# Patient Record
Sex: Female | Born: 1935
Health system: Southern US, Community
[De-identification: ages and names within clinical notes are randomized; demographics above are authoritative.]

## PROBLEM LIST (undated history)

## (undated) DIAGNOSIS — R32 Unspecified urinary incontinence: Secondary | ICD-10-CM

## (undated) DIAGNOSIS — G8929 Other chronic pain: Secondary | ICD-10-CM

## (undated) DIAGNOSIS — K219 Gastro-esophageal reflux disease without esophagitis: Secondary | ICD-10-CM

## (undated) DIAGNOSIS — J449 Chronic obstructive pulmonary disease, unspecified: Secondary | ICD-10-CM

## (undated) DIAGNOSIS — Z8673 Personal history of transient ischemic attack (TIA), and cerebral infarction without residual deficits: Secondary | ICD-10-CM

## (undated) DIAGNOSIS — Z973 Presence of spectacles and contact lenses: Secondary | ICD-10-CM

## (undated) DIAGNOSIS — I10 Essential (primary) hypertension: Secondary | ICD-10-CM

## (undated) DIAGNOSIS — M1990 Unspecified osteoarthritis, unspecified site: Secondary | ICD-10-CM

## (undated) DIAGNOSIS — Z8719 Personal history of other diseases of the digestive system: Secondary | ICD-10-CM

## (undated) DIAGNOSIS — M549 Dorsalgia, unspecified: Secondary | ICD-10-CM

## (undated) DIAGNOSIS — I499 Cardiac arrhythmia, unspecified: Secondary | ICD-10-CM

## (undated) DIAGNOSIS — Z952 Presence of prosthetic heart valve: Secondary | ICD-10-CM

## (undated) DIAGNOSIS — Z8711 Personal history of peptic ulcer disease: Secondary | ICD-10-CM

## (undated) DIAGNOSIS — K5790 Diverticulosis of intestine, part unspecified, without perforation or abscess without bleeding: Secondary | ICD-10-CM

## (undated) DIAGNOSIS — I639 Cerebral infarction, unspecified: Secondary | ICD-10-CM

## (undated) DIAGNOSIS — H3530 Unspecified macular degeneration: Secondary | ICD-10-CM

## (undated) DIAGNOSIS — N3010 Interstitial cystitis (chronic) without hematuria: Secondary | ICD-10-CM

## (undated) DIAGNOSIS — E785 Hyperlipidemia, unspecified: Secondary | ICD-10-CM

## (undated) DIAGNOSIS — I272 Pulmonary hypertension, unspecified: Secondary | ICD-10-CM

## (undated) DIAGNOSIS — M21379 Foot drop, unspecified foot: Secondary | ICD-10-CM

## (undated) DIAGNOSIS — IMO0001 Reserved for inherently not codable concepts without codable children: Secondary | ICD-10-CM

## (undated) DIAGNOSIS — H9190 Unspecified hearing loss, unspecified ear: Secondary | ICD-10-CM

## (undated) DIAGNOSIS — I4891 Unspecified atrial fibrillation: Secondary | ICD-10-CM

## (undated) DIAGNOSIS — Z8679 Personal history of other diseases of the circulatory system: Secondary | ICD-10-CM

## (undated) DIAGNOSIS — H547 Unspecified visual loss: Secondary | ICD-10-CM

## (undated) DIAGNOSIS — Z972 Presence of dental prosthetic device (complete) (partial): Secondary | ICD-10-CM

## (undated) DIAGNOSIS — I219 Acute myocardial infarction, unspecified: Secondary | ICD-10-CM

## (undated) DIAGNOSIS — R932 Abnormal findings on diagnostic imaging of liver and biliary tract: Secondary | ICD-10-CM

## (undated) HISTORY — PX: DILATION AND CURETTAGE OF UTERUS: SHX78

## (undated) HISTORY — PX: OTHER SURGICAL HISTORY: SHX169

## (undated) HISTORY — DX: Cerebral infarction, unspecified: I63.9

## (undated) HISTORY — PX: CARPAL TUNNEL RELEASE: SHX101

## (undated) HISTORY — DX: Diverticulosis of intestine, part unspecified, without perforation or abscess without bleeding: K57.90

## (undated) HISTORY — DX: Unspecified urinary incontinence: R32

## (undated) HISTORY — DX: Abnormal findings on diagnostic imaging of liver and biliary tract: R93.2

## (undated) HISTORY — PX: TONSILLECTOMY AND ADENOIDECTOMY: SUR1326

## (undated) HISTORY — PX: LUMBAR DISC SURGERY: SHX700

## (undated) HISTORY — DX: Interstitial cystitis (chronic) without hematuria: N30.10

## (undated) HISTORY — DX: Pulmonary hypertension, unspecified: I27.20

## (undated) HISTORY — DX: Unspecified osteoarthritis, unspecified site: M19.90

## (undated) HISTORY — PX: CATARACT EXTRACTION W/ INTRAOCULAR LENS  IMPLANT, BILATERAL: SHX1307

## (undated) HISTORY — PX: CARDIOVASCULAR STRESS TEST: SHX262

## (undated) HISTORY — DX: Gastro-esophageal reflux disease without esophagitis: K21.9

## (undated) HISTORY — DX: Unspecified atrial fibrillation: I48.91

## (undated) HISTORY — DX: Essential (primary) hypertension: I10

## (undated) HISTORY — DX: Unspecified macular degeneration: H35.30

## (undated) HISTORY — DX: Hyperlipidemia, unspecified: E78.5

## (undated) HISTORY — PX: CARDIAC CATHETERIZATION: SHX172

---
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
## 1965-04-11 HISTORY — PX: ABDOMINAL HYSTERECTOMY: SHX81

## 1967-04-12 HISTORY — PX: VEIN LIGATION: SHX2652

## 1991-04-12 HISTORY — PX: CHOLECYSTECTOMY: SHX55

## 1997-12-26 ENCOUNTER — Ambulatory Visit (HOSPITAL_COMMUNITY): Admission: RE | Admit: 1997-12-26 | Discharge: 1997-12-26 | Payer: Self-pay | Admitting: *Deleted

## 1998-10-01 ENCOUNTER — Ambulatory Visit (HOSPITAL_BASED_OUTPATIENT_CLINIC_OR_DEPARTMENT_OTHER): Admission: RE | Admit: 1998-10-01 | Discharge: 1998-10-01 | Payer: Self-pay | Admitting: Orthopedic Surgery

## 1999-03-25 ENCOUNTER — Encounter: Payer: Self-pay | Admitting: *Deleted

## 1999-03-26 ENCOUNTER — Encounter (INDEPENDENT_AMBULATORY_CARE_PROVIDER_SITE_OTHER): Payer: Self-pay | Admitting: Specialist

## 1999-03-26 ENCOUNTER — Ambulatory Visit (HOSPITAL_COMMUNITY): Admission: RE | Admit: 1999-03-26 | Discharge: 1999-03-26 | Payer: Self-pay | Admitting: *Deleted

## 1999-07-16 ENCOUNTER — Ambulatory Visit (HOSPITAL_COMMUNITY): Admission: RE | Admit: 1999-07-16 | Discharge: 1999-07-16 | Payer: Self-pay | Admitting: *Deleted

## 1999-10-07 ENCOUNTER — Encounter: Payer: Self-pay | Admitting: Orthopedic Surgery

## 1999-10-07 ENCOUNTER — Encounter: Admission: RE | Admit: 1999-10-07 | Discharge: 1999-10-07 | Payer: Self-pay | Admitting: Orthopedic Surgery

## 1999-10-08 ENCOUNTER — Ambulatory Visit (HOSPITAL_BASED_OUTPATIENT_CLINIC_OR_DEPARTMENT_OTHER): Admission: RE | Admit: 1999-10-08 | Discharge: 1999-10-09 | Payer: Self-pay | Admitting: Orthopedic Surgery

## 1999-10-08 HISTORY — PX: OTHER SURGICAL HISTORY: SHX169

## 1999-10-19 ENCOUNTER — Encounter: Admission: RE | Admit: 1999-10-19 | Discharge: 1999-11-25 | Payer: Self-pay | Admitting: Orthopedic Surgery

## 2000-01-07 ENCOUNTER — Ambulatory Visit (HOSPITAL_COMMUNITY): Admission: RE | Admit: 2000-01-07 | Discharge: 2000-01-07 | Payer: Self-pay | Admitting: *Deleted

## 2000-04-06 ENCOUNTER — Ambulatory Visit (HOSPITAL_COMMUNITY): Admission: RE | Admit: 2000-04-06 | Discharge: 2000-04-06 | Payer: Self-pay | Admitting: *Deleted

## 2000-08-11 ENCOUNTER — Ambulatory Visit (HOSPITAL_COMMUNITY): Admission: RE | Admit: 2000-08-11 | Discharge: 2000-08-11 | Payer: Self-pay | Admitting: *Deleted

## 2001-01-16 ENCOUNTER — Other Ambulatory Visit: Admission: RE | Admit: 2001-01-16 | Discharge: 2001-01-16 | Payer: Self-pay | Admitting: Family Medicine

## 2001-01-31 ENCOUNTER — Encounter: Payer: Self-pay | Admitting: *Deleted

## 2001-01-31 ENCOUNTER — Encounter: Admission: RE | Admit: 2001-01-31 | Discharge: 2001-01-31 | Payer: Self-pay | Admitting: *Deleted

## 2001-02-02 ENCOUNTER — Ambulatory Visit (HOSPITAL_BASED_OUTPATIENT_CLINIC_OR_DEPARTMENT_OTHER): Admission: RE | Admit: 2001-02-02 | Discharge: 2001-02-02 | Payer: Self-pay | Admitting: *Deleted

## 2001-06-06 ENCOUNTER — Encounter: Payer: Self-pay | Admitting: Sports Medicine

## 2001-06-06 ENCOUNTER — Encounter: Admission: RE | Admit: 2001-06-06 | Discharge: 2001-06-06 | Payer: Self-pay | Admitting: Sports Medicine

## 2001-07-11 ENCOUNTER — Encounter: Admission: RE | Admit: 2001-07-11 | Discharge: 2001-07-11 | Payer: Self-pay | Admitting: Orthopedic Surgery

## 2001-07-11 ENCOUNTER — Encounter: Payer: Self-pay | Admitting: Orthopedic Surgery

## 2001-07-12 ENCOUNTER — Ambulatory Visit (HOSPITAL_BASED_OUTPATIENT_CLINIC_OR_DEPARTMENT_OTHER): Admission: RE | Admit: 2001-07-12 | Discharge: 2001-07-13 | Payer: Self-pay | Admitting: Orthopedic Surgery

## 2001-07-12 HISTORY — PX: OTHER SURGICAL HISTORY: SHX169

## 2002-01-11 ENCOUNTER — Ambulatory Visit (HOSPITAL_BASED_OUTPATIENT_CLINIC_OR_DEPARTMENT_OTHER): Admission: RE | Admit: 2002-01-11 | Discharge: 2002-01-11 | Payer: Self-pay | Admitting: *Deleted

## 2002-07-29 ENCOUNTER — Encounter: Admission: RE | Admit: 2002-07-29 | Discharge: 2002-07-29 | Payer: Self-pay | Admitting: Urology

## 2002-07-29 ENCOUNTER — Encounter: Payer: Self-pay | Admitting: Urology

## 2002-07-30 ENCOUNTER — Ambulatory Visit (HOSPITAL_BASED_OUTPATIENT_CLINIC_OR_DEPARTMENT_OTHER): Admission: RE | Admit: 2002-07-30 | Discharge: 2002-07-30 | Payer: Self-pay | Admitting: Urology

## 2002-07-30 HISTORY — PX: OTHER SURGICAL HISTORY: SHX169

## 2002-08-29 ENCOUNTER — Ambulatory Visit (HOSPITAL_COMMUNITY): Admission: RE | Admit: 2002-08-29 | Discharge: 2002-08-29 | Payer: Self-pay | Admitting: Gastroenterology

## 2002-08-29 ENCOUNTER — Encounter (INDEPENDENT_AMBULATORY_CARE_PROVIDER_SITE_OTHER): Payer: Self-pay | Admitting: Specialist

## 2003-03-11 ENCOUNTER — Ambulatory Visit (HOSPITAL_COMMUNITY): Admission: RE | Admit: 2003-03-11 | Discharge: 2003-03-11 | Payer: Self-pay | Admitting: *Deleted

## 2003-03-11 ENCOUNTER — Ambulatory Visit (HOSPITAL_BASED_OUTPATIENT_CLINIC_OR_DEPARTMENT_OTHER): Admission: RE | Admit: 2003-03-11 | Discharge: 2003-03-11 | Payer: Self-pay | Admitting: *Deleted

## 2003-07-15 ENCOUNTER — Ambulatory Visit (HOSPITAL_COMMUNITY): Admission: RE | Admit: 2003-07-15 | Discharge: 2003-07-15 | Payer: Self-pay | Admitting: *Deleted

## 2003-09-29 ENCOUNTER — Inpatient Hospital Stay (HOSPITAL_COMMUNITY): Admission: RE | Admit: 2003-09-29 | Discharge: 2003-10-06 | Payer: Self-pay | Admitting: Cardiothoracic Surgery

## 2003-09-29 ENCOUNTER — Encounter (INDEPENDENT_AMBULATORY_CARE_PROVIDER_SITE_OTHER): Payer: Self-pay | Admitting: *Deleted

## 2003-09-29 HISTORY — PX: AORTIC VALVE REPLACEMENT: SHX41

## 2003-10-02 DIAGNOSIS — Z952 Presence of prosthetic heart valve: Secondary | ICD-10-CM | POA: Insufficient documentation

## 2003-11-18 ENCOUNTER — Encounter (HOSPITAL_COMMUNITY): Admission: RE | Admit: 2003-11-18 | Discharge: 2004-02-16 | Payer: Self-pay | Admitting: *Deleted

## 2004-02-10 ENCOUNTER — Ambulatory Visit (HOSPITAL_BASED_OUTPATIENT_CLINIC_OR_DEPARTMENT_OTHER): Admission: RE | Admit: 2004-02-10 | Discharge: 2004-02-10 | Payer: Self-pay | Admitting: Urology

## 2004-02-10 ENCOUNTER — Ambulatory Visit (HOSPITAL_COMMUNITY): Admission: RE | Admit: 2004-02-10 | Discharge: 2004-02-10 | Payer: Self-pay | Admitting: Urology

## 2004-02-17 ENCOUNTER — Encounter (HOSPITAL_COMMUNITY): Admission: RE | Admit: 2004-02-17 | Discharge: 2004-05-17 | Payer: Self-pay | Admitting: *Deleted

## 2004-11-11 ENCOUNTER — Ambulatory Visit (HOSPITAL_COMMUNITY): Admission: RE | Admit: 2004-11-11 | Discharge: 2004-11-11 | Payer: Self-pay | Admitting: Urology

## 2004-11-11 ENCOUNTER — Ambulatory Visit (HOSPITAL_BASED_OUTPATIENT_CLINIC_OR_DEPARTMENT_OTHER): Admission: RE | Admit: 2004-11-11 | Discharge: 2004-11-11 | Payer: Self-pay | Admitting: Urology

## 2004-12-18 ENCOUNTER — Encounter: Admission: RE | Admit: 2004-12-18 | Discharge: 2004-12-18 | Payer: Self-pay | Admitting: Family Medicine

## 2005-07-06 ENCOUNTER — Encounter: Admission: RE | Admit: 2005-07-06 | Discharge: 2005-07-06 | Payer: Self-pay | Admitting: Urology

## 2005-07-07 ENCOUNTER — Ambulatory Visit (HOSPITAL_BASED_OUTPATIENT_CLINIC_OR_DEPARTMENT_OTHER): Admission: RE | Admit: 2005-07-07 | Discharge: 2005-07-07 | Payer: Self-pay | Admitting: Urology

## 2005-10-13 ENCOUNTER — Encounter: Admission: RE | Admit: 2005-10-13 | Discharge: 2005-10-13 | Payer: Self-pay | Admitting: Neurosurgery

## 2005-10-21 ENCOUNTER — Observation Stay (HOSPITAL_COMMUNITY): Admission: RE | Admit: 2005-10-21 | Discharge: 2005-10-22 | Payer: Self-pay | Admitting: Neurosurgery

## 2006-02-21 ENCOUNTER — Ambulatory Visit (HOSPITAL_BASED_OUTPATIENT_CLINIC_OR_DEPARTMENT_OTHER): Admission: RE | Admit: 2006-02-21 | Discharge: 2006-02-21 | Payer: Self-pay | Admitting: Urology

## 2006-12-08 ENCOUNTER — Encounter: Admission: RE | Admit: 2006-12-08 | Discharge: 2006-12-08 | Payer: Self-pay | Admitting: Urology

## 2006-12-14 ENCOUNTER — Ambulatory Visit (HOSPITAL_BASED_OUTPATIENT_CLINIC_OR_DEPARTMENT_OTHER): Admission: RE | Admit: 2006-12-14 | Discharge: 2006-12-14 | Payer: Self-pay | Admitting: Urology

## 2007-02-03 ENCOUNTER — Emergency Department (HOSPITAL_COMMUNITY): Admission: EM | Admit: 2007-02-03 | Discharge: 2007-02-03 | Payer: Self-pay | Admitting: Emergency Medicine

## 2007-03-02 ENCOUNTER — Encounter: Admission: RE | Admit: 2007-03-02 | Discharge: 2007-03-02 | Payer: Self-pay | Admitting: Family Medicine

## 2007-06-14 ENCOUNTER — Encounter: Admission: RE | Admit: 2007-06-14 | Discharge: 2007-06-14 | Payer: Self-pay | Admitting: Family Medicine

## 2007-12-13 ENCOUNTER — Ambulatory Visit (HOSPITAL_BASED_OUTPATIENT_CLINIC_OR_DEPARTMENT_OTHER): Admission: RE | Admit: 2007-12-13 | Discharge: 2007-12-13 | Payer: Self-pay | Admitting: Urology

## 2008-02-21 ENCOUNTER — Encounter: Admission: RE | Admit: 2008-02-21 | Discharge: 2008-02-21 | Payer: Self-pay | Admitting: Family Medicine

## 2009-08-27 ENCOUNTER — Ambulatory Visit (HOSPITAL_BASED_OUTPATIENT_CLINIC_OR_DEPARTMENT_OTHER): Admission: RE | Admit: 2009-08-27 | Discharge: 2009-08-27 | Payer: Self-pay | Admitting: Urology

## 2009-08-27 HISTORY — PX: OTHER SURGICAL HISTORY: SHX169

## 2010-05-02 ENCOUNTER — Encounter: Payer: Self-pay | Admitting: Neurosurgery

## 2010-06-28 LAB — POCT I-STAT 4, (NA,K, GLUC, HGB,HCT): Hemoglobin: 14.3 g/dL (ref 12.0–15.0)

## 2010-08-24 NOTE — Op Note (Signed)
Brittany [name], Brittany [name]               ACCOUNT NO.:  [id number]   MEDICAL RECORD NO.:  [id number]          PATIENT TYPE:  AMB   LOCATION:  [facility]                         FACILITY:  [facility]   PHYSICIAN:  Excell Seltzer. Annabell Howells, M.D.    DATE OF BIRTH:  01-03-1928   DATE OF PROCEDURE:  12/13/2007  DATE OF DISCHARGE:                               OPERATIVE REPORT   PROCEDURES:  1. Cystoscopy.  2. Hydrodistention of the bladder.  3. Comptroller.  4. Urethral dilation.   PREOPERATIVE DIAGNOSIS:  Interstitial cystitis.   POSTOPERATIVE DIAGNOSIS:  Interstitial cystitis with urethral stenosis.   SURGEON:  Bjorn Pippin, MD.   ANESTHESIA:  General.   COMPLICATIONS:  None.   INDICATIONS:  Brittany [name] is a 77 year old white female with a long  history of ICD who requires periodic hydrodistention and Clorpactin  installation with dilation.   FINDINGS/PROCEDURE:  She was given Cipro.  She was taken to the  operating room where a general anesthetic was induced.  She was placed  in lithotomy position.  Her perineum and genitalia were prepped with  Betadine solution, she was draped in the usual sterile fashion.  A  cystoscopy was performed using a 22-French scope and the 70 degree lens.  Examination revealed a normal urethra with the exception of some  residual collagen bilaterally at the bladder neck.  Examination of  bladder wall revealed mild trabeculation, there was a stellate scar on  the left wall from a prior biopsy no tumors or stones were noted, the  ureteral orifices were unremarkable.   The bladder was dilated under 80 centimeters of water pressure to  capacity and then drained.  She held 1200 mL.  Repeat cystoscopy  following dilation revealed no evidence of mucosal tears or  glomerulations.   The cystoscope was removed and a red rubber catheter was inserted and  the bladder was instilled with approximately 240 mL of Clorpactin  solution mixed 1 ampule in 500 mL of sterile water.   The solution was  left indwelling for approximately 2 minutes and the bladder was then  drained.   The urethra was calibrated to 34-French with female sound, she was tight  at 34, which gives suggestive of narrowing.   The perineum was rinsed, she was taken down from lithotomy position, her  anesthetic was reversed, she was moved to the recovery room in stable  condition.  There were no complications.      Excell Seltzer. Annabell Howells, M.D.  Electronically Signed     JJW/MEDQ  D:  12/13/2007  T:  12/13/2007  Job:  [id number]

## 2010-08-24 NOTE — Op Note (Signed)
Brittany [name], Brittany [name]               ACCOUNT NO.:  [id number]   MEDICAL RECORD NO.:  [id number]          PATIENT TYPE:  AMB   LOCATION:  N[facility]                         FACILITY:  [facility]   PHYSICIAN:  Excell Seltzer. Annabell Howells, M.D.    DATE OF BIRTH:  26-May-1927   DATE OF PROCEDURE:  12/14/2006  DATE OF DISCHARGE:                               OPERATIVE REPORT   PROCEDURE:  Cystoscopy with hydrodistention of bladder, installation of  Clorpactin, urethral calibration, installation of Pyridium and Marcaine.   PREOPERATIVE DIAGNOSIS:  Interstitial cystitis.   POSTOPERATIVE DIAGNOSIS:  Interstitial cystitis.   SURGEON:  Dr. Bjorn Pippin.   ANESTHESIA:  General.   SPECIMEN:  None.   COMPLICATIONS:  None.   INDICATIONS:  Brittany [name] is a 77 year old white female with a long  history of interstitial cystitis who requires periodic hydrodistention  and installation of Clorpactin.  Her symptoms recurred in the past week.   FINDINGS/PROCEDURE:  She was taken operating room where a general  anesthetic was induced.  She was placed in the lithotomy position.  She  was given vancomycin 1 gram and gentamicin 1.5 mg for SBE prophylaxis  prior to the procedure.  General anesthetic induced.  She was placed in  lithotomy position.  Her perineum and genitalia were prepped with  Betadine solution and she was draped in the usual sterile fashion.  Cystoscopy was performed using the 22-French scope and the 12 degrees  lens.  Examination revealed some residual Contigen at the bladder neck  bilaterally.  No other urethral lesions were noted.  The bladder wall  had mild trabeculation.  No tumor, stones or inflammation were noted.  Ureteral orifices were unremarkable.  The bladder was dilated under 80  cm of water pressure to capacity and was held for 1 minute the bladder  was drained, her capacity under anesthesia was 900 mL.  There were no  glomerular hemorrhages noted or bladder wall tearing on post dilation   cystoscopy.   She then underwent installation of the bladder with 180 mL of Clorpactin  solution, 1 ampule mixed in 500 mL of normal saline.  This was left  indwelling for a minute   A 26 urethral sound was passed.  There was no evidence of stenosis.  The  bladder was drained and then instilled with 30 mL of 0.25% Marcaine with  400 mg of crushed Pyridium.  The perineum was then rinsed.  The  patient's drapes were removed.  She was taken down from lithotomy  position.  Her anesthetic was reversed.  She was admitted to the  recovery room in stable condition.  There were no complications.      Excell Seltzer. Annabell Howells, M.D.  Electronically Signed     JJW/MEDQ  D:  12/14/2006  T:  12/14/2006  Job:  [id number]

## 2010-08-27 NOTE — H&P (Signed)
NAME:  Brittany [name], Brittany [name] NO.:  [id number]   MEDICAL RECORD NO.:  [id number]                     PATIENT TYPE:   LOCATION:                                       FACILITY:   PHYSICIAN:  Lynnea Ferrier., M.D.         DATE OF BIRTH:   DATE OF ADMISSION:  DATE OF DISCHARGE:                                HISTORY & PHYSICAL   REASON FOR ADMISSION:  This 77 year old female is scheduled for cystoscopy,  hydrodistention, Clorpactin irrigation of the bladder at [facility] on January 11, 2002, with chief complaint of painful frequent  urination.   HISTORY OF PRESENT ILLNESS:  This 77 year old female has been followed in  our office since 1995 with recurrent episodes of painful frequent urination  associated with low back pain.  Initially, seen with similar symptoms by Dr.  Dannette Barbara in 1990.  Investigation suggested interstitial cystitis and she has  required cystoscopy and hydrodistention every six to 12 months.  Her last  one was in October of 2002 and about 10 days ago she began having urgency,  frequency, dysuria, low back pain which was severe and did not improve on  Cipro.  In the past, she has not responded very well to DMSO treatments  either. She had a normal urinalysis, one to two ounces in her bladder, no  hydronephrosis right or left by ultrasound, and a very tender bladder.  She  was scheduled for cystoscopy and hydrodistention on January 11, 2002.   ALLERGIES:  MACROBID, MORPHINE, CODEINE, KEFLEX.   MEDICATIONS:  Fosamax, Prevacid, Premarin, Elmiron, atenolol, Librax,  Darvocet, Neurontin, and Nexium.   PAST MEDICAL HISTORY:  This reveals that the patient has had PAT, history of  a heart murmur and this has been controlled with Inderal and more recently  with atenolol.  She does use Tagamet when her peptic ulcer disease has  symptoms and she has a history of hiatal hernia as well.   PAST SURGICAL HISTORY:  She has had  cholecystectomy, abdominal hysterectomy,  vein ligation, finger surgeries along with multiple cystoscopies and  hydrodistentions.   FAMILY HISTORY:  This is positive for heart disease, alcoholism; but  negative for diabetes, stones, cancer, hypertension, and to her knowledge no  bleeders run in the family.  She does have a daughter with pancreatitis  possibly related to cystic fibrosis.   SOCIAL HISTORY:  The patient is married.  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  She quit smoking some 10 years ago.   REVIEW OF SYSTEMS:  General health has been fair.  Weight has been a problem  in recent years, increasing slowly.  HEENT:  Unremarkable except for hay  fever.  She denies asthma.  CARDIORESPIRATORY:  She denies any chest pain or  heart attack, but does have episodes of a tachycardia.  She stopped smoking  some 10 years ago and  her medications have helped this.  GI:  She has a  history of peptic ulcer disease, hiatal hernia, no recent symptoms.  She has  had no bloody or tarry stools.  Nexium has helped her.  There has also been  a question of irritable bowel syndrome but no hepatitis.  BONES, JOINTS,  MUSCLES:  Unremarkable except for arthritis in the fingers and hands  primarily, more recently in the hip.  NEUROPSYCHIATRIC:  No history of  stroke, fainting, falling out spells.  SKIN:  No lesions.  LYMPHATIC:  No  nodes noted.   PHYSICAL EXAMINATION:  GENERAL APPEARANCE:  The patient is a well-developed,  moderately obese 53 -year-old female.  VITAL SIGNS:  Weight 183, temperature 97.2, pulse 73, respiratory rate 17,  blood pressure 190/85.  HEENT:  The ears and tympanic membranes are unremarkable.  Eyes react  normally to light and accommodation.  Extraocular movements intact.  Pharynx  benign.  Teeth in fair condition.  NECK:  No enlargement of thyroid or nodes palpable.  CHEST:  Clear to percussion and auscultation.  CARDIOVASCULAR:  Normal sinus rhythm.  She  does have a grade 2 murmur.  ABDOMEN:  She is mildly obese.  Liver, kidney, spleen, masses, hernia not  detected.  There is suprapubic tenderness which is marked.  There is a right  paramedian incision, low midline incision, right lower quadrant incision and  laparoscopic scars.  BREASTS:  Not examined.  PELVIC:  Bladder is very tender.  There is only fair support.  The external  genitalia and meatus are normal.  No pelvic or rectal masses noted.  There  is fairly good rectal tone.  EXTREMITIES:  No edema.  Good peripheral pulses.  NEUROLOGIC:  Grossly normal reflexes and sensation.   IMPRESSION:  1. Interstitial cystitis.  2. Recurrent urinary tract infections.  3. Paroxysmal atrial tachycardia.  4. Hypertension.   PLAN:  Cystoscopy, hydrodistention, Clorpactin irrigation of bladder.                                                 Lynnea Ferrier., M.D.    Brittany [name]  D:  01/08/2002  T:  01/08/2002  Job:  [id number]

## 2010-08-27 NOTE — Op Note (Signed)
NAMEALYANA, Brittany [name]               ACCOUNT NO.:  [id number]   MEDICAL RECORD NO.:  [id number]          PATIENT TYPE:  AMB   LOCATION:  [facility]                         FACILITY:  [facility]   PHYSICIAN:  Excell Seltzer. Annabell Howells, M.D.    DATE OF BIRTH:  07/09/1927   DATE OF PROCEDURE:  02/10/2004  DATE OF DISCHARGE:                                 OPERATIVE REPORT   PROCEDURES:  1.  Cystoscopy.  2.  Hydrodistention of the bladder.  3.  Installation of Clorpactin, Pyridium, and Marcaine.  4.  Urethral dilation.   PREOPERATIVE DIAGNOSIS:  Interstitial cystitis.   POSTOPERATIVE DIAGNOSIS:  Interstitial cystitis.   SURGEON:  Excell Seltzer. Annabell Howells, M.D.   ANESTHESIA:  General.   COMPLICATIONS:  None.   INDICATION:  [name] is a 77 year old white female with a long history of  interstitial cystitis.  She has had a recent flare of symptoms and generally  responds to hydrodistention and Clorpactin installation.   FINDINGS AND PROCEDURE:  The patient was given SBE prophylaxis with  ampicillin and gentamicin for an aortic valve replacement.  She was taken to  the operating room where general anesthetic was induced.  She was placed in  the lithotomy position.  Her peritoneum and genitalia were prepped with  Betadine solution.  She was draped in the usual sterile fashion.  Cystoscopy  was performed using a 22-French scope and a 70-degree lens.  Examination  revealed a normal urethra.  The bladder wall was smooth and pale without  tumor, stones, or inflammation.  The ureteral orifices were unremarkable.  The cystoscope was then removed.  A 16-French Foley catheter was inserted.  Balloon was filled with 10 cc of air and the bladder was then dilated under  80 cm of water pressure to capacity.  The bladder was left dilated for 5  minutes and then drained.  Her capacity under anesthesia was 1400 cc.  Re-  cystoscopy after hydrodistention demonstrated some scattered small  glomerular hemorrhages on the lateral walls  of the bladder.  No other  abnormalities were noted.  At this point, the Foley catheter was reinserted  and Clorpactin mixed 1 amp in 1 L was instilled.  Approximately 300 cc were  placed.  This was left indwelling for 2 minutes and then drained using the  female sounds.  Her urethra was calibrated easily to 34-French without  evidence of stricture.  Once the bladder was drained of Clorpactin, I  reinstilled 30 cc of 0.25% Marcaine with 400 mg crushed  Pyridium and the dilator was removed.  The perineum was rinsed of residual  Clorpactin solution.  The patient was taken down from lithotomy position.  Her anesthetic was reversed.  She was moved to the recovery room in stable  condition.  There were no complications.      JJW/MEDQ  D:  02/10/2004  T:  02/10/2004  Job:  [id number]   cc:   Meade Maw, M.D.  [address]  [place]  [place] [address]  Fax: [phone]   Donia Guiles, M.D.  [address]  [place] [address]  Fax: [phone]

## 2010-08-27 NOTE — Op Note (Signed)
[facility]  Patient:    Brittany [name], Brittany [name] Visit Number: [id number] MRN: [id number]          Service Type: Attending:  Radene Knee., M.D. Proc. Date: 02/02/01                             Operative Report  PREOPERATIVE DIAGNOSES: 1. Interstitial cystitis. 2. Recurrent urinary tract infections. 3. Paroxysmal atrial tachycardia with systolic murmur. 4. Hypertension.  POSTOPERATIVE DIAGNOSES: 1. Interstitial cystitis. 2. Recurrent urinary tract infections. 3. Paroxysmal atrial tachycardia with systolic murmur. 4. Hypertension.  OPERATION PERFORMED:  Cystoscopy, hydrodistention, Clorpactin irrigation of bladder.  DESCRIPTION OF OPERATION:  This 77 year old female brought to the operating room, underwent successful induction of general anesthesia, was prepped and draped in the lithotomy position.  Using the 22 cystourethroscope with 70 and 12 degree lenses, the bladder was carefully inspected.  Initially no stone, tumor, nor ulcer was noted.  Right and left ureteral orifices were normal. The bladder was distended to capacity which was approximately 600 cc, decompressed.  She developed generalized hyperemia and erythema of a mild to moderate degree and rather marked petechial hemorrhages over the trigone primarily.  There was no cracking and bleeding.  The patients bladder was irrigated with 1000 cc of Clorpactin solution, emptied, and she returned to recovery room in stable condition.  The plan is for the patient to go home on Cipro 500 b.i.d., use Darvocet-N 100 p.r.n. for pain.  She will continue her regular medications which are Fosamax, Prevacid, Premarin, Elmiron, atenolol, Librax and see [place] in the office in 1-2 weeks or p.r.n. Attending:  Radene Knee., M.D. DD:  02/02/01 TD:  02/04/01 Job: [id number]

## 2010-08-27 NOTE — Op Note (Signed)
   NAME:  Brittany [name], Brittany [name]                         ACCOUNT NO.:  [id number]   MEDICAL RECORD NO.:  [id number]                   PATIENT TYPE:  AMB   LOCATION:  ENDO                                 FACILITY:  [facility]   PHYSICIAN:  Petra Kuba, M.D.                 DATE OF BIRTH:  [birth year]   DATE OF PROCEDURE:  08/29/2002  DATE OF DISCHARGE:                                 OPERATIVE REPORT   PROCEDURE:  EGD with biopsy.   INDICATION:  History of atypical chest pain, helped with Nexium.  Consent  was signed after risks, benefits, methods, options thoroughly discussed in  the office.   MEDICINES USED:  1. Demerol 40.  2. Versed 6.   DESCRIPTION OF PROCEDURE:  The video endoscope was inserted by direct  vision.  The esophagus was normal.  In the distal esophagus was a small  hiatal hernia.  The scope passed into the stomach, advanced through the  antrum where some mild antritis was seen, advanced through a normal duodenal  bulb and around the C-loop to a normal second portion of the duodenum.  The  scope was withdrawn back to the bulb, and a good look there ruled out  abnormalities in that location.  Scope was withdrawn back to the stomach,  and retroflexed.  Angularis and fundus was normal.  High in the cardia, the  hiatal hernia was confirmed.  Lesser and greater curve were normal on  retroflexed visualization.  Straight visualization in the stomach did not  reveal any additional findings.  Went ahead and advanced to the antrum, and  a few biopsies of the antritis were obtained.  Air was suctioned, the scope  slowly withdrawn.  Again, a good look at the esophagus was normal.  The  scope was removed.  The patient tolerated the procedure well.  There was no  obvious immediate complication.   ENDOSCOPIC DIAGNOSES:  1. Small hiatal hernia.  2. Minimal antritis, status post biopsy.  3. Otherwise normal EGD.    PLAN:  1. Continue pump inhibitors.  2. Follow-up p.r.n. or in two  months to recheck symptoms and make sure no     further work-up plans are needed.                                               Petra Kuba, M.D.    MEM/MEDQ  D:  08/29/2002  T:  08/29/2002  Job:  [id number]   cc:   Donia Guiles, M.D.  [address] [place]  [place] [address]  Fax: [phone]

## 2010-08-27 NOTE — Op Note (Signed)
NAME:  Brittany [name], Brittany [name]                         ACCOUNT NO.:  [id number]   MEDICAL RECORD NO.:  [id number]                   PATIENT TYPE:  AMB   LOCATION:  [facility]                                 FACILITY:  [facility]   PHYSICIAN:  Ethelene Browns., M.D.            DATE OF BIRTH:  February 09, 1928   DATE OF PROCEDURE:  03/11/2003  DATE OF DISCHARGE:                                 OPERATIVE REPORT   PREOPERATIVE DIAGNOSES:  1. Interstitial cystitis.  2. Recurrent urinary tract infections.  3. Paroxysmal auricular tachycardia with abnormal electrocardiogram and     systolic murmur.  4. Hypertension.   POSTOPERATIVE DIAGNOSES:  1. Interstitial cystitis.  2. Recurrent urinary tract infections.  3. Paroxysmal auricular tachycardia with abnormal electrocardiogram and     systolic murmur.  4. Hypertension.   PROCEDURES:  1. Cystoscopy.  2. Hydrodistention and Clorpactin irrigation of the bladder.   DESCRIPTION OF PROCEDURE:  This 77 year old female was brought to the  operating room and received IV gentamicin 80 mg IV in preparation for  anesthesia.  The patient underwent successful general anesthetic induction,  was prepped and draped in the lithotomy position, and the bladder inspected  with a #22 cystourethroscope using 70 and 12 degree lenses.  No stone,  tumor, or no ulcer was noted.  The right and left ureteral orifices were  normal.  The bladder was distended to capacity, which was 800 mL, and  decompressed.  She developed generalized hyperemia and erythema of a mild to  moderate degree and over the trigone, she developed moderately severe  petechial hemorrhages.  There was no cracking or bleeding noted.  The  patient's bladder was emptied and irrigated 1000 mL of Clorpactin solution  and emptied again.   The patient returned to the recovery area in a stable condition.  The plan  is for the patient to get 400 mg of IV Cipro.  She will go home and take  p.o. Cipro, Darvocet-N 100  p.r.n. for pain.  Return to the office in 1-2  weeks and force fluids, avoid caffeine and alcohol.                                               Ethelene Browns., M.D.    HB/MEDQ  D:  03/11/2003  T:  03/11/2003  Job:  [id number]

## 2010-08-27 NOTE — Cardiovascular Report (Signed)
NAME:  Brittany [name], Brittany [name]                         ACCOUNT NO.:  [id number]   MEDICAL RECORD NO.:  [id number]                   PATIENT TYPE:  OIB   LOCATION:  2899                                 FACILITY:  [facility]   PHYSICIAN:  Meade Maw, M.D.                 DATE OF BIRTH:  11-Nov-1927   DATE OF PROCEDURE:  07/15/2003  DATE OF DISCHARGE:                              CARDIAC CATHETERIZATION   INDICATION FOR PROCEDURE:  Moderate to moderately severe calcific aortic  stenosis with chest pain.   PROCEDURE:  After obtaining written informed consent, the patient was  brought to the cardiac catheterization lab in a postabsorptive state.  Preop  sedation was achieved using IV Versed.  The right groin was prepped and  draped in the usual sterile fashion.  Local anesthesia was achieved using 1%  Xylocaine.  A 6 French hemostasis sheath was placed into the right femoral  artery using a modified Seldinger technique.  Selective coronary angiography  was performed using a JL-4, JR-4 Judkins catheter.  Multiple views were  obtained.  All catheter exchanges were made over guide wire.  Left heart  pressures were obtained using the [facility] catheter.  The [facility] catheter  was then exchanged out for a 6 French pigtail curved catheter.   FINDINGS:  1. Aortic pressure was 138/66.  2. LV pressure 163/11.  3. EDP 17.  4. The mean gradient was 28 mmHg.   CORONARY ANGIOGRAPHY:  1. The left main coronary artery bifurcates into the left anterior     descending and circumflex vessel.  There is no disease noted in the left     main coronary artery.  2. LAD:  LAD gives rise to a large bifurcating D1, small D2.  The distal     left anterior descending is tortuous.  There are luminal irregularities     only in the left anterior descending.  3. Circumflex vessel:  Circumflex vessel is a large vessel and gives rise to     a  large OM-1, large OM-2 which covers most of the posterior lateral     distribution,  small OM-3, small OM-4.  There is no disease noted in the     circumflex vessel.  4. Right coronary artery:  The right coronary artery is a small vessel less     than 2 mm in size.  There is luminal irregularities of up to 20-30% in     the left coronary artery.   FINAL IMPRESSION:  1. Moderate to moderately severe calcific aortic stenosis.  2. Normal coronary angiography.   RECOMMENDATIONS:  Bacterial endocarditis prophylaxis.  These findings will  be discussed with the patient.  At this point, would defer aortic valve  replacement.  Will continue to follow the valve by transthoracic echo.  Meade Maw, M.D.    HP/MEDQ  D:  07/15/2003  T:  07/15/2003  Job:  [id number]

## 2010-08-27 NOTE — H&P (Signed)
NAME:  Brittany [name], Brittany [name]                         ACCOUNT NO.:  [id number]   MEDICAL RECORD NO.:  [id number]                   PATIENT TYPE:  INP   LOCATION:  NA                                   FACILITY:  [facility]   PHYSICIAN:  Edward B. Tyrone Sage, M.D.            DATE OF BIRTH:  07-13-1927   DATE OF ADMISSION:  DATE OF DISCHARGE:                                HISTORY & PHYSICAL   PRIMARY CARE PHYSICIAN:  Donia Guiles, M.D.   CARDIOLOGIST:  Meade Maw, M.D.   HOSPITAL COURSE:  Aortic valve stenosis.   HISTORY OF PRESENT ILLNESS:  This is a 77 year old female referred by Dr.  Fraser Din for evaluation of aortic stenosis. The patient had a known murmur  for approximately a year. This was first discovered by Dr. Ewing Schlein when she  was evaluated for gastroesophageal reflux disease. The patient was  subsequently referred to Dr. Fraser Din. Dr. Fraser Din has been following the  patient with serial echocardiograms. Her most recent echocardiogram revealed  aortic valve gradient of 28 mm and a calculated valve area of 1.3 which had  progressed from previous echocardiograms. The patient recently saw Dr.  Fraser Din with increased signs and symptoms including angina, left chest  tightness, discomfort radiating to the left arm, and dyspnea on exertion.  The patient has not experienced syncope. Cardiac catheterization revealed no  high grade stenosis and confirmed at least 28 mm aortic valve gradient.  Ventricular function is preserved. The patient was subsequently referred to  Dr. Tyrone Sage for evaluation of aortic valve replacement. The patient does  experience angina, shortness of breath, dyspnea on exertion, fatigue,  incontinence, and occasional palpitations. The patient denies paroxysmal  nocturnal dyspnea, peripheral edema, polyuria, nocturia, hematuria,  hematochezia, cough, and sputum production.   PAST MEDICAL HISTORY:  1. Hypertension.  2. Hyperlipidemia.  3. Gastroesophageal reflux disease.  4. Interstitial cystitis with intermittent bladder irrigation.   PAST SURGICAL HISTORY:  1. Hysterectomy in 1968.  2. Bladder tacking in 2003.  3. Vein ligation of the right lower extremity in 1969.  4. Back surgery repair with ruptured disk in 1985.  5. Rotator cuff repair in 2002.  6. Appendectomy in 1932.  7. Cholecystectomy.  8. Tonsillectomy.  9. Bilateral carpal tunnel repair.  10.      Bilateral cataract removal.   MEDICATIONS:  1. Premarin 0.625 mg q.d.  2. Aciphex 20 mg b.i.d.  3. Norvasc 5 mg q.d.  4. Ditropan XL 10 mg q.d.  5. Atenolol 100 mg q.d.  6. Neurontin 100 mg t.i.d.  7. Elmiron 100 mg t.i.d.  8. Lipitor 10 mg q.d.   ALLERGIES:  1. MORPHINE. The patient has a previous addiction.  2. CODEINE which causes nausea.  3. KEFLEX which causes a rash.   REVIEW OF SYSTEMS:  Please see HPI for significant positives and negatives.   FAMILY HISTORY:  Mother, arthritis. Father, heart disease. Sister, heart  disease. Brother, heart  disease.   SOCIAL HISTORY:  This is a married female with four children. She is  retired. She denies any alcohol use and quit smoking in 1991 after smoking  less than a half pack per day for 39 years.   PHYSICAL EXAMINATION:  VITAL SIGNS:  Blood pressure 124/68, pulse 70,  respirations 14.  GENERAL:  This is a 77 year old white female in no acute distress. She is  alert and oriented x3.  HEENT:  Normocephalic, atraumatic. Pupils are equal, round, and reactive to  light and accommodation. Extraocular movements are intact. No cataracts,  glaucoma, or macular degeneration.  NECK:  Supple with no JVD. There is radiation of the murmur to bilateral  carotids. No lymphadenopathy.  CHEST:  Symmetrical in inspiration.  LUNGS:  No wheezes, rhonchi, or rales.  CARDIAC:  Regular rate and rhythm with 2/6 holosystolic murmur. No rubs, no  gallops.  ABDOMEN:  Soft, nontender. Bowel sounds were auscultated x4. No masses, no  bruits.   GENITOURINARY/RECTAL:  Deferred.  EXTREMITIES:  No clubbing, no cyanosis, no edema, no ulcerations. The  temperature is warm.  PERIPHERAL PULSES:  The carotid, femoral, popliteal, and posterior tibia are  2+ bilaterally.  NEUROLOGICAL:  Nonfocal. Gait is steady. Deep tendon reflexes are 2+, and  muscle strength 5/5.   ASSESSMENT:  Aortic valve stenosis.   PLAN:  Aortic valve replacement with a tissue valve by Dr. Tyrone Sage. Dr.  Tyrone Sage has seen and evaluated this patient prior to this admission and has  explained the risks and benefits of the procedure, and the patient has  agreed to continue.      Pecola Leisure, PA                      Gwenith Daily. Tyrone Sage, M.D.    AY/MEDQ  D:  09/25/2003  T:  09/26/2003  Job:  [id number]

## 2010-08-27 NOTE — Op Note (Signed)
[place]  Patient:    Brittany [name], Brittany [name]                      MRN: [id number] Proc. Date: 10/08/99 Adm. Date:  [id number] Attending:  Colbert Ewing                           Operative Report  PREOPERATIVE DIAGNOSIS:  Right shoulder chronic impingement with degenerative joint disease, acromioclavicular joint, partial versus complete thickness tear of rotator cuff.  POSTOPERATIVE DIAGNOSIS:  Right shoulder chronic impingement with degenerative joint disease, acromioclavicular joint, partial versus complete thickness tear of rotator cuff with only partial tear of rotator cuff and significant ___________.  PROCEDURE:  Right shoulder exam under anesthesia, arthroscopy, debridement of labral tears and rotator cuff from above and below, arthroscopic acromioplasty, and distal clavicle excision with CA ligament release.  SURGEON:  Loreta Ave, M.D.  ASSISTANT:  Arlys John D. Petrarca, P.A.-C.  ANESTHESIA:  General.  ESTIMATED BLOOD LOSS:  Minimal.  SPECIMENS:  None.  CULTURES:  None.  COMPLICATIONS:  None.  DRESSING:  Self-compressive.  DESCRIPTION OF PROCEDURE:  The patient was brought to the operating room and after adequate anesthesia had been obtained, right shoulder examined.  Full motion with stable shoulder.  Placed in the beach chair position on the shoulder positioner, prepped and draped in the usual sterile fashion.  Three arthroscopic portals anterior and posterolateral.  Shoulder entered with blunt obturator, distended, and inspected.  Articular cartilage looked excellent. Extensive attritional tearing of labrum, almost circumferentially.  More superior than inferior.  Debrided to a stable surface.  Capsular ligamentous structures intact.  Biceps tendon well-anchored and intact.  The under surface of the rotator cuff crescent region with extensive partial thickness tearing, but no full thickness tears.  The _______  redirected subacromially.  The cuff inspected from above with chronic impingement and marked abrasive changes, but no full thickness tears.  Type 2 to 3 acromion anteriorly.  Converted to a type 1 acromion with chisel and high speed bur acromioplasty.  CA ligament incised to complete decompression.  Distal clavicle had grade 4 changes and was also ______ to impingement.  Lateral centimeter sharply resected with shaver and high speed bur.  Adequacy of decompression confirmed from all portals.  The cuff thoroughly inspected from above utilizing all portals and no full thickness tears seen.  The instruments and fluid removed.  The portals, shoulder, and bursa injected with Marcaine.  The portals closed with 4-0 nylon.  A sterile compressive dressing applied.  The anesthesia reversed and brought to the recovery room. Tolerated surgery well with no complications. DD:  10/08/99 TD:  10/09/99 Job: [id number]

## 2010-08-27 NOTE — Op Note (Signed)
NAME:  Brittany [name], Brittany [name]                         ACCOUNT NO.:  [id number]   MEDICAL RECORD NO.:  [id number]                   PATIENT TYPE:  INP   LOCATION:  2302                                 FACILITY:  [facility]   PHYSICIAN:  Guadalupe Maple, M.D.               DATE OF BIRTH:  Sep 12, 1927   DATE OF PROCEDURE:  09/29/2003  DATE OF DISCHARGE:                                 OPERATIVE REPORT   PROCEDURE:  Intraoperative transesophageal echocardiography.   Ms. Jenny Lai is a 77 year old white female with a history of aortic  stenosis and worsening chest pain and shortness of breath who is scheduled  to undergo aortic valve replacement by Dr. Ofilia Neas.  Intraoperative  transesophageal echocardiography was requested to evaluate the aortic valve  as well as determine if any other valvular pathology was present and to  assess the left ventricular function.   The patient was brought to the operating room at [facility] and  general anesthesia was initiated without difficulty.  The trachea was  intubated without difficulty.  The transesophageal echocardiography probe  was inserted in the esophagus without difficulty.   IMPRESSION:   PREBYPASS FINDINGS:  1. Moderate aortic stenosis.  The valve was trileaflet with thickening of     the aortic leaflet and some calcification at the base of the leaflet.     The valve area by planimetry measured 1.17 sq cm.  The valve area by     continuity equation measured 1.17 sq cm.  The peak gradient by continuous     wave Doppler was  48 mmHg, mean gradient 27.7 mmHg.  The aortic annulus measured 2.24 cm.  The  aortic root through the sinus of Valsalva measured 3.88 cm.  The diameter at  the sinotubular ridge was 3.08 cm and the proximal aorta distal to the  sinotubular ridge measured 2.91 cm.  There was 1+ aortic insufficiency as  well.  1. Mitral valve showed the leaflets coapted well and appeared to open     normally.  There was trace to  1+ mitral insufficiency.  There was no     evidence _________ or fluttering of the leaflets and there was moderate     calcification at the base of the posterior leaflet.  2. Normal left ventricular function.  There was normal contractility in all     segments of the left ventricle that were interrogated.  Ejection fraction     was estimated at 55%-60%.  Left ventricular wall thickness measured 0.95     to 1.0 cm at end-diastole at the mid-papillary level.  3. Tricuspid valve showed trace tricuspid insufficiency but otherwise     appeared normal.  4. The inner atrial septum was intact without evidence of patent foramen     ovale or atrioseptal defect.  5. The left atrium appeared to be within normal limits for size.  There was  no thrombus noted in the left atrium or left atrial appendage.  6. The right ventricular function appeared normal.  There was good     contractility of the right ventricular free wall.  7. The proximal ascending aorta showed mild to moderate atheromatous disease     in the descending aorta.  Appeared to be within normal limits of size     with mild atheromatous changes.                                               Guadalupe Maple, M.D.    DCJ/MEDQ  D:  09/29/2003  T:  09/30/2003  Job:  [id number]

## 2010-08-27 NOTE — Discharge Summary (Signed)
NAME:  Brittany [name], Brittany [name]                         ACCOUNT NO.:  [id number]   MEDICAL RECORD NO.:  [id number]                   PATIENT TYPE:  INP   LOCATION:  2040                                 FACILITY:  [facility]   PHYSICIAN:  Gwenith Daily. Tyrone Sage, M.D.            DATE OF BIRTH:  12-05-1927   DATE OF ADMISSION:  09/29/2003  DATE OF DISCHARGE:                                 DISCHARGE SUMMARY   ANTICIPATED DATE OF DISCHARGE:  October 04, 2003   ADMITTING DIAGNOSIS:  Critical aortic valve stenosis.   PAST MEDICAL HISTORY:  1. Hypertension.  2. Hyperlipidemia.  3. GERD.  4. Interstitial cystitis with intermittent bladder irrigation.   PAST SURGICAL HISTORY:  1. Includes hysterectomy in 1968.  2. Bladder tack in 2003.  3. Vein ligation right lower extremity 1969.  4. Lumbar ruptured disk in 1985.  5. Rotator cuff repair 2002.  6. Appendectomy.  7. Cholecystectomy.  8. Tonsillectomy.  9. Bilateral carpal tunnel repair.  10.      Bilateral cataract removal.   ALLERGIES:  Patient is allergic to [facility] causes a rash, CODEINE causes  nausea.   MEDICATIONS:  The patient does not taken morphine; she has had a previous  addiction.   DISCHARGE DIAGNOSIS:  Critical aortic valve stenosis, status post aortic  valve replacement.   BRIEF HISTORY:  Brittany [name] is a 77 year old Caucasian female.  She has had a  heart murmur which has been followed for approximately 1 year.  Dr. Meade Maw has been following her with serial echocardiograms.  Most recent  echocardiogram revealed valve gradient 28 mm and a calculated valve area of  1.3; this was progressive from her previous echocardiograms.  She has had  increased signs and symptoms of congestive heart failure and angina; so Dr.  Fraser Din recommended cardiac catheterization.  This revealed no high-grade  carotid arterial stenosis; however, it did confirm severe aortic valve  stenosis with a valve area of approximately 1.  She was referred to  Dr.  Sheliah Plane for consideration of surgical intervention.   She was evaluated by Dr. Tyrone Sage of the [facility] office, after examination of  the patient and review of the medical records, he agreed that aortic valve  replacement was the treatment choice for this woman. Options for valve  replacement were discussed including mechanical versus tissue valve  prosthesis.  Dr. Tyrone Sage and Ms. Bischoff agreed to proceed with tissue valve  replacement.   HOSPITAL COURSE:  On September 29, 2003 Brittany [name] was electively admitted to  [facility] under the care of Dr. Sheliah Plane.  She underwent  the following surgical procedures: Aortic valve replacement with a 21-mm  pericardial tissue valve.  She tolerated this procedure well transferring in  stable condition to the SICU.  She was extubated several hours after arrival  in the intensive care unit and awoke from anesthesia neurologically intact.  She remained hemodynamically stable in  the immediate postoperative period.  She required only routine care in the intensive care unit and was  transferred to unit 2000 on postoperative day 2.   [name] is making very good progress recovering from her surgery. She has  had a maculopapular rash develop over her back, trunk, and arms. We are not  sure if this is from the oxycodone or Avelox antibiotic.  Both of these  medications have been discontinued.  Her rash is improving slowly.   On October 03, 2003, on postoperative day 4 on morning rounds, Brittany [name]  reports feeling very well.  Her vital signs are stable, blood pressure  122/66.  She is afebrile; 96% oxygen saturation on 2 liters nasal cannula.  Her heart has maintained a normal sinus rhythm at 90 beats per minute. Her  lungs are with slightly decreased sounds on the right, otherwise clear.  She  is tolerating her diet well.  Bowel and bladder functions within normal  limits for her.  Her incision is healing very well.  She has no  complaints  of pain.  She does continue to have the rash; by this afternoon reports that  this is improving. She has been ambulating frequently with her family as  well as cardiac rehab services.  She is making progress in ambulating.  She  does feel more comfortable using a walker.  Arrangements have been made for  her to have a walker at home.  Coumadin prophylactic therapy has been  started for her aortic valve.  Her INR is rising slowly.  PT and INR will be  followed at home, results to be reported to Dr. Lindell Spar office.  If Ms.  [name] continues to progress, it is anticipated that she will be ready for  discharge home in the morning, October 04, 2003.   LABORATORY STUDIES:  June 23 CBC white blood cells 16.0, hemoglobin 9.3,  hematocrit 27.5, platelets 135.  Chemistries on June 23 included sodium 140,  potassium 4.2, BUN 8, creatinine 0.8, glucose 122.  On June 24 coagulation  studies:  PT 13.6, INR 1.1; she has an INR goal of 2.0.   CONDITION ON DISCHARGE:  Improved.   DISCHARGE MEDICATIONS:  1. Metoprolol 12.5 mg p.o. b.i.d.  2. Lasix 20 mg daily for 5 days.  3. Potassium chloride 10 mEq daily for 5 days.  4. Coumadin, final dose to be determined at discharge, probably 5 mg a day.     First outpatient PT/INR to be checked on Monday June 27.  She is to resume her home medications of:  1. Lipitor 10 mg daily.  2. Premarin 0.625 mg daily.  3. Neurontin 100 mg t.i.d.  4. Ditropan XL 10 mg daily.  5. Imuran 100 mg t.i.d.  6. For pain management she may have Ultram 50 mg 1-2 p.o. q.6h. for moderate-     to-severe pain or Tylenol 325 mg 1-2 p.o. q.4h. for mild pain.   INSTRUCTIONS ON DISCHARGE:  1. Activity:  She has been asked to refrain from any driving or any heavy     lifting.  2. Also instructed to continue her breathing exercises and daily walking.  3. Her diet should continue to be a low fat, low salt diet. 4. Wound care:  She is to shower daily with mild soap and water.   If her     incision is showing signs of infection she is to call Dr. Dennie Maizes     office.  5. As above, home  health services have been arranged through [facility].  Home health nurse will visit for cardiac restorative care as well     as PT and INR blood draws.  First blood draw, Monday, June 27 result to     Dr. Lindell Spar office at [facility].  6. Follow up:  Ms. Sprankle has been advised to arrange an appointment to see     Dr. Fraser Din at her office in approximately 2 weeks.  She will have a     chest x-ray taken that day.  7. Dr. Tyrone Sage would like to see her back at the [facility] office on Thursday,     July 14, at 11:50 a.m.  She will be asked to bring her chest x-ray from     Dr. Lindell Spar office for Dr. Tyrone Sage to review.      Toribio Harbour, N.P.                  Gwenith Daily Tyrone Sage, M.D.    CTK/MEDQ  D:  10/03/2003  T:  10/05/2003  Job:  [id number]   cc:   Gwenith Daily. Tyrone Sage, M.D.  [address]  [place]  [place] [address]   Meade Maw, M.D.  [address]  [place]  [place] [address]  Fax: [phone]   Donia Guiles, M.D.  [address] [place]  [place] [address]  Fax: [phone]

## 2010-08-27 NOTE — Op Note (Signed)
[facility]  Patient:    GENOA, FREYRE                      MRN: [id number] Proc. Date: 04/06/00 Adm. Date:  [id number] Attending:  Dalbert Mayotte                           Operative Report  PREOPERATIVE DIAGNOSES: 1. Interstitial cystitis. 2. Recurrent urinary tract infections. 3. Hypertension. 4. PAT.  POSTOPERATIVE DIAGNOSES: 1. Interstitial cystitis. 2. Recurrent urinary tract infections. 3. Hypertension. 4. PAT.  OPERATION PERFORMED:  Cystoscopy with hydrodistention, with chloroprocaine and irrigation of the bladder.  DESCRIPTION OF PROCEDURE:  This 77 year old female brought to the operating room and underwent successful induction of general endotracheal anesthesia. She was prepped and draped in the lithotomy position.  She received 80 mg of IV gentamicin preoperatively.  The patients bladder was inspected with the #22 cystourethroscope, using 70 and 30 degree lenses.  Initially no stone, tumor nor ulcer was noted.  Right and left ureteral orifices were normal.  There was only some mild ______ erythema of the bladder.  The bladder was then distended to capacity (which was approximately 750 to 800 mL) and decompressed.  She developed submucosal petechial-like hemorrhages over the trigone.  No cracking nor bleeding was observed.  There was a moderate generalized hyper________ erythema.  The patients bladder was then emptied and she was irrigated with 1000 cc of chloroprocaine solution.  The bladder was again emptied.  The patient returned to the recovery area in a stable condition.  PLAN:  Patient to go home on Cipro 500 mg b.i.d. for a few days.  She is to have Darvocet-N 100 p.r.n. for pain.  She is to return to the office in six weeks for p.r.n.  Will observe the patient until she is fully stable and allow her to go home today. DD:  04/06/00 TD:  04/06/00 Job: [id number]

## 2010-08-27 NOTE — H&P (Signed)
NAME:  Brittany [name], Brittany [name] NO.:  [id number]   MEDICAL RECORD NO.:  [id number]                   PATIENT TYPE:   LOCATION:                                       FACILITY:   PHYSICIAN:  Ethelene Browns., M.D.            DATE OF BIRTH:  February 12, 1928   DATE OF ADMISSION:  DATE OF DISCHARGE:                                HISTORY & PHYSICAL   This 77 year old female is scheduled for cystoscopy, hydrodistention, and  Clorpactin irrigation of the bladder at [facility], with a chief  complaint of painful frequent urination.   PRESENT ILLNESS:  This 77 year old female has been followed in our office  since 1995 with recurrent episodes of painful, frequent urination associated  with low back pain.  She was seen with similar symptoms by Dr. Dannette Barbara in  1990.  Investigations suggested interstitial cystitis, and she has required  cystoscopy and hydrodistention about every 6 to 12 months since that time.  She has been taking Elmiron and was last distended and cystoscoped in  October of 2003.  The patient did reasonably well until the last three weeks  when she had increasing pain in the bladder with frequency, low back pain,  and did not improve on antibiotics.  In the past, she has not responded to  DMSO treatments and does not wish to try that course. She had a normal  urinalysis of 1 to 2 ounces in the bladder, no hydronephrosis right or left,  and a very tender bladder. For that reason, she was scheduled for cystoscopy  and hydrodistention at [facility], first available opening.   ALLERGIES:  1. MACROBID.  2. MORPHINE.  3. CODEINE.  4. KEFLEX.   PRESENT MEDICATIONS:  Fosamax, Prevacid, Premarin, Elmiron, atenolol,  Librax, Darvocet, Neurontin, and Nexium.   PAST MEDICAL HISTORY:  1. PAT with an aortic heart murmur.  2. History of peptic ulcer disease.  3. History of hiatal hernia.   PAST SURGICAL HISTORY:  1. Cholecystectomy.  2.  Hysterectomy.  3. Vein ligation.  4. Finger surgeries.  5. Multiple cystoscopies with hydrodistentions.   FAMILY HISTORY:  Positive for heart disease and alcoholism, but negative for  diabetes, stones, cancer, and hypertension; and to her knowledge there are  no bleeders in the family. She does have a daughter with pancreatitis  possibly related to cystic fibrosis.   SOCIAL HISTORY:  The patient is married, is a housewife, does not abuse  alcohol or tobacco, stopped smoking some 10 years ago.   REVIEW OF SYSTEMS:  General health has been fair. Weight has been a problem  in recent years, increasing slowly. HEENT: Unremarkable except for hay  fever. CARDIORESPIRATORY: She denies any chest pain, heart attack, or  asthma, but does have episodes of tachycardia. GI: She has a history of  peptic ulcer disease, hiatal hernia, but no recent symptoms or history of  hepatitis. BONES,  JOINTS, MUSCLES:  Unremarkable except for arthritis in the  fingers and hands primarily and to some degree in the hip.  NEUROPSYCHIATRIC: No history of stroke, falling out spells. SKIN: No  lesions. LYMPHATIC: No nodes or anemia noted.   PHYSICAL EXAMINATION:  GENERAL: Well-developed, moderately obese, 74-year-  old female.  VITAL SIGNS: Weight about 182, temperature 97.4, pulse 70, respirations 17,  blood pressure 190/80.  HEENT: The ears and  tympanic membranes are unremarkable.  Eyes react normal  to light and accommodation. Extraocular movements intact. Pharynx benign.  Teeth in good repair.  NECK: No enlargement of the thyroid. No nodes palpable.  CHEST: Clear to auscultation and percussion.  HEART: She has a grade 2 systolic murmur, normal sinus rhythm.  ABDOMEN: Mildly to moderately obese. Liver, kidney, spleen, masses,  tenderness, or hernia are not detected.  There is suprapubic tenderness  which is marked. There is a right paramedian incision, low midline incision,  right lower quadrant incision, and  laparoscopic scars.  BREASTS: Not examined.  PELVIC: External genitalia and meatus are normal. The bladder is very  tender. No rectal or pelvic masses noted. The uterus is absent. Rectal tone  is good. No rectal masses noted.  EXTREMITIES: No edema. Good peripheral pulses.  NEUROLOGIC: Grossly normal reflex and sensations.  SKIN: No lesions.  LYMPHATICS: No nodes noted.   IMPRESSION:  1. Interstitial cystitis.  2. Recurrent urinary tract infection.  3. Paroxysmal atrial tachycardia.  4. Hypertension.   PLAN:  Cystoscopy, hydrodistention with Clorpactin irrigation of bladder.                                               Ethelene Browns., M.D.    HB/MEDQ  D:  03/05/2003  T:  03/05/2003  Job:  [id number]

## 2010-08-27 NOTE — Op Note (Signed)
Brittany [name], Brittany [name]               ACCOUNT NO.:  [id number]   MEDICAL RECORD NO.:  [id number]          PATIENT TYPE:  AMB   LOCATION:  [facility]                         FACILITY:  [facility]   PHYSICIAN:  Excell Seltzer. Annabell Howells, M.D.    DATE OF BIRTH:  [birth year]   DATE OF PROCEDURE:  07/07/2005  DATE OF DISCHARGE:                                 OPERATIVE REPORT   PROCEDURE:  1.  Cystoscopy.  2.  Hydrodistention of the bladder.  3.  Urethral dilation.  4.  Installation of Clorpactin, Pyridium, and Marcaine.   PREOPERATIVE DIAGNOSIS:  Interstitial cystitis.   POSTOPERATIVE DIAGNOSIS:  Interstitial cystitis.   SURGEON:  Excell Seltzer. Annabell Howells, M.D.   ANESTHESIA:  General.   SPECIMEN:  None.   COMPLICATIONS:  None.   INDICATIONS:  [name] is a 77 year old white female who has a long history  of interstitial cystitis and requires intermittent hydrodistention and  installations.   FINDINGS AND PROCEDURE:  She was given ampicillin and gentamicin for SBE  prophylaxis.  A general anesthetic was induced.  She was placed in the  lithotomy position.  Her perineum and genitalia were prepped with Betadine  solution.  She was draped in the usual sterile fashion.  Cystoscopy was  performed using the 12 and 70 degrees lenses.  Examination revealed a normal  urethra.  The bladder wall had mild trabeculation.  No tumor, stones, or  inflammation noted.  Ureteral orifices were unremarkable.   The bladder was then dilated to capacity under 80 cm of water pressure.  This was held for 3 minutes; and the bladder was then drained.  Her capacity  under anesthesia was approximately 1200 mL.  There was no bleeding at the  end of filling.  Repeat cystoscopy revealed a few glomerulations in the  trigone, but otherwise benign appearing mucosa.  A red rubber catheter was  then used to instill 250 mL of Clorpactin, diluted, 1 ampule in 1 liter.   The Clorpactin was left indwelling for approximately 5 minutes.  The urethra  was then calibrated with __________ sounds to 34-French and no stenosis was  noted.  The bladder was drained during the dilation.  She then was instilled  with 20 mL of 1/2% Marcaine with 400 mg of crushed Pyridium.  Her perineum  was cleansed; she was taken down from the lithotomy position; her anesthetic  was reversed.  She was moved to the recovery room in stable condition.  There were no complications.      Excell Seltzer. Annabell Howells, M.D.  Electronically Signed     JJW/MEDQ  D:  07/07/2005  T:  07/08/2005  Job:  [id number]   cc:   Donia Guiles, M.D.  Fax: [phone]

## 2010-08-27 NOTE — Op Note (Signed)
[facility]  Patient:    Brittany [name], Brittany [name] Visit Number: [id number] MRN: [id number]          Service Type: DSU Location: [facility] Attending Physician:  Colbert Ewing Dictated by:   Loreta Ave, M.D. Proc. Date: 07/12/01 Admit Date:  07/12/2001 Discharge Date: 07/13/2001                             Operative Report  PREOPERATIVE DIAGNOSIS:  Rotator cuff tear of right shoulder, status post previous acromioplasty and distal clavicle resection for decompression.  POSTOPERATIVE DIAGNOSIS:  Rotator cuff tear of right shoulder, status post previous acromioplasty and distal clavicle resection for decompression.  Grade 2 and 3 changes glenohumeral joint and osteochondral loose bodies intra-articularly.  OPERATION PERFORMED:  Right shoulder examination under anesthesia, arthroscopy. debridement of glenohumeral joint with removal of loose bodies. Assessment of acromioplasty.  Mini open repair rotator cuff tear with fiberwire suture times two and Concept repair system.  SURGEON:  Loreta Ave, M.D.  ASSISTANT:  Arlys John D. Petrarca, P.A.-C.  ANESTHESIA:  General.  ESTIMATED BLOOD LOSS:  Minimal.  SPECIMENS:  None.  CULTURES:  None.  COMPLICATIONS:  None.  DRESSING:  Soft compressive with shoulder immobilizer.  DESCRIPTION OF PROCEDURE:  The patient was brought to the operating room and placed on the operating table in supine position.  After adequate anesthesia had been obtained, the right shoulder examined.  Full motion, good stability. Placed in beach chair position on a shoulder positioner.  Prepped and draped in the usual sterile fashion.  Three standard arthroscopic portals, anterior, posterior and lateral.  Shoulder entered with blunt obturator, distended and inspected. Numerous small osteochondral loose bodies were removed with the shaver system.  Emanating from grade 2 and mild grade 3 changes on the glenoid and humeral  head.   All of these smoothed evenly.  Reasonable remaining articular cartilage even after completion.  Little fraying superior labrum debrided.  Capsular and ligamentous structures intact.  Biceps tendon and biceps anchor intact.  Area of previously documented partial tearing of the supraspinatus tendon in the crescent had now a full thickness tear extending over the anterior two thirds of the supraspinatus.  Retracted but definitely repairable and mobile.  Cannula redirected subacromially.  Other than the tear in the cuff there was no recurrent impingement.  All of the surfaces of the cuff were nice and smooth.  No spurring at the clavicle or acromion. Instruments and fluid removed.  Lateral portal opened into a deltoid splitting incision.  Cuff was debrided back to healthy tissue throughout.  Mobilized. Well captured with two #2 fiberwire sutures.  Bony trough created in the humerus at the attachment site.  Concept repair system used to make three tunnels in the humerus at the attachment site and the sutures that had been placed in the cuff were weaved through the holes. They were then firmly tied over a bony bridge yielding a nice firm watertight closure of the cuff without undue tension.  Full passive motion without undue tension.  Adequate decompression firmed digitally at the time of cuff repair.  Wound irrigated. Deltoid closed with Vicryl, skin and subcutaneous tissues with Vicryl. Margins of wound injected Marcaine.  Portals closed with nylon and injected with Marcaine as well.  Sterile compressive dressing and shoulder immobilizer applied.  Anesthesia reversed.  Brought to recovery room.  Tolerated surgery well.  No complications. Dictated by:   Margarette Asal.  Eulah Pont, M.D. Attending Physician:  Colbert Ewing DD:  07/12/01 TD:  07/13/01 Job: [id number]

## 2010-08-27 NOTE — Op Note (Signed)
Brittany [name], Brittany [name]               ACCOUNT NO.:  [id number]   MEDICAL RECORD NO.:  [id number]          PATIENT TYPE:  AMB   LOCATION:  [facility]                         FACILITY:  [facility]   PHYSICIAN:  Excell Seltzer. Annabell Howells, M.D.    DATE OF BIRTH:  [birth year]   DATE OF PROCEDURE:  02/21/2006  DATE OF DISCHARGE:                               OPERATIVE REPORT   PROCEDURE:  Cystoscopy, hydrodistention to bladder, urethral dilation,  installation of Pyridium and Marcaine.   PREOPERATIVE DIAGNOSIS:  Interstitial cystitis.   POSTOPERATIVE DIAGNOSIS:  Interstitial cystitis.   SURGEON:  Dr. Bjorn Pippin.   ANESTHESIA:  General.   SPECIMEN:  None.   COMPLICATIONS:  None.   INDICATIONS:  Ms. Carnero is a 77 year old white female with a long history  of interstitial cystitis who routinely undergoes hydrodistention of  bladder with response generally durable for 6 months.  She says she  recently had a flare of her symptoms and we scheduled repeat  hydrodistention.  She also has a history of mixed incontinence with  prior collagen injections.   FINDINGS DURING PROCEDURE:  The patient was taken to the operating room.  She was given amp and gent for SBE prophylaxis by Dr. Shireen Quan.  She was  given a general anesthetic and placed in lithotomy position.  Her  perineum and genitalia were prepped with Betadine solution.  She was  draped in the usual sterile fashion.  Cystoscopy was performed using the  22-French scope and 12 and 70 degree lenses.  Examination revealed a  urethra with submucosal collagen primarily on the right with some  bulging into the mucosa.  Examination of the bladder revealed mild  trabeculation.  No tumors, stones or inflammation were noted.  The  ureteral orifices were unremarkable.   She then underwent hydrodistention of the bladder to a capacity at 80 cm  of water pressure.  Her bladder capacity was 1300 mL.  Terminal efflux  was clear.   Re-cystoscopy after dilation demonstrated no  significant glomerulations  or bladder wall tearing.  There was some debris noted in the bladder,  which on further inspection, appeared to be collagen material that had  eroded through the urethral meatus, possibly aggravated by the  cystoscopy, and there was less bulging of the collagen into the urethral  lumen.  The collagen material was irrigated from the bladder.   The cystoscope was then removed.  The urethra was calibrated to 40-  [place] with  [employer] sounds.  There was no stricture.  She then underwent  instillation of 400 mg of Pyridium and 30 mL of 0.25% Marcaine.  She was  taken down from the lithotomy position.  Her anesthetic was reversed.  She was moved to the recovery room in stable condition.  There were no  complications.      Excell Seltzer. Annabell Howells, M.D.  Electronically Signed     JJW/MEDQ  D:  02/21/2006  T:  02/21/2006  Job:  [id number]   cc:   Donia Guiles, M.D.  Fax: [phone]

## 2010-08-27 NOTE — H&P (Signed)
[facility]  Patient:    Brittany [name], Brittany [name]                        MRN: [id number] Attending:  Radene Knee., M.D.                         History and Physical  REASON FOR ADMISSION:  This 77 year old female is scheduled for cystoscopy, hydrodistention and Clorpactin irrigation of the bladder on January 07, 2000.  CHIEF COMPLAINT:  Painful frequent urination.  PRESENT ILLNESS:  This 77 year old female has been followed at our office since 1995 with recurrent episodes of painful frequent urination and low back and bladder pain.  The patient has previously seen Dr. Marta Lamas in 1990 with similar episodes and initially responded to DMSO treatments, but more recently has failed to respond to these treatments and has required cystoscopy and hydrodistention under anesthesia with Clorpactin irrigation. Her last treatment was carried out in May of 2001 and she did well until the last few weeks, when she had increasing painful frequent urination which has not responded to Cipro.  She has not passed blood, gravel or stones but she does void every 30 to 60 minutes and gets up several times a night.  ALLERGIES:  MORPHINE, CODEINE, KEFLEX, MACROBID.  CURRENT MEDICATIONS:  Elmiron, Premarin, Inderal, Librax, Darvocet-N 100 and Cipro.  PAST MEDICAL HISTORY:  The patient has had paroxysmal ______  tachycardia, controlled with Inderal.  She uses Tagamet for peptic ulcer disease and has been told that she has a hiatal hernia.  PAST SURGICAL HISTORY:  Past surgeries include gallbladder, abdominal hysterectomy, vein ligation, finger surgery by Dr. Tennis Must. Meyerdierks, with multiple cystoscopies and hydrodistentions about every six months.  REVIEW OF SYSTEMS:  General health has been fairly good.  Weight has been stable.  HEENT:  Unremarkable but she does have hayfever and denies asthma. CARDIORESPIRATORY:  She denies any chest pain or heart attack.   She stopped smoking seven or eight years ago.  GI:  She has a history of peptic ulcer disease and hiatal hernia but has not had recent problems with these.  No history of hepatitis.  Her bowels are loose at times and suggested some degree of irritable bowel syndrome but no bloody or tarry stools.  BONES, JOINTS AND MUSCLES:  Unremarkable except for arthritis in her fingers and hands. NEUROPSYCHIATRIC:  No history of stroke, fainting or falling-out spells.  FAMILY HISTORY:  Family history is positive for heart disease and alcoholism. She has no history of diabetes, stones or cancer or hypertension in the family to her knowledge.  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  She quit smoking some eight years ago.  PHYSICAL EXAMINATION  GENERAL:  Physical examination reveals a well-developed, well-nourished 77 year old female in no acute distress.  VITAL SIGNS:  Temperature 98, blood pressure 162/99, pulse 68, respirations 16.  HEENT:  The ears and tympanic membranes are unremarkable.  Eyes react normally to light and accommodation.  Extraocular movements are intact.  Pharynx benign.  Teeth in fair condition.  No enlargement of the thyroid.  No nodes are palpable.  CHEST:  Clear to percussion and auscultation.  HEART:  Normal sinus rhythm.  No murmur.  Not enlarged.  BREASTS:  Not examined.  ABDOMEN:  Mildly obese.  No masses or tenderness except for the suprapubic area where  there is a low midline scar and some tenderness on deep palpation.  PELVIC AND RECTAL:  Examination revealed a very tender bladder.  No masses are noted.  Rectal tone is good.  EXTREMITIES:  No edema.  Good peripheral pulses.  NEUROLOGIC:  Grossly normal reflexes and sensation.  IMPRESSION 1. Interstitial cystitis. 2. Recurrent urinary tract infections. 3. Paroxysmal atrial tachycardia. 4. Hypertension.  PLAN:  Cystoscopy, hydrodistention and  Clorpactin irrigation of the bladder scheduled for January 07, 2000. DD:  01/04/00 TD:  01/04/00 Job: [id number]

## 2010-08-27 NOTE — Op Note (Signed)
[facility]  Patient:    Brittany [name], Brittany [name]                        MRN: [id number] Proc. Date: 01/07/00 Attending:  Radene Knee., M.D.                           Operative Report  PREOPERATIVE DIAGNOSES: 1. Interstitial cystitis. 2. Recurrent urinary tract infection. 3. Paroxysmal atrial tachycardia. 4. Hypertension.  POSTOPERATIVE DIAGNOSES: 1. Interstitial cystitis. 2. Recurrent urinary tract infection. 3. Paroxysmal atrial tachycardia. 4. Hypertension.  OPERATION PERFORMED:  Cystoscopy, hydrodistention, Clorpactin irrigation of the bladder.  DESCRIPTION OF PROCEDURE:  This patient age 77 is brought to the operating room and underwent the successful induction of general anesthesia after receiving IV gentamycin 80 mg and was prepped and draped in the lithotomy position. The urethra dilated easily to a 30 french. The bladder was then inspected with a #25 cystourethroscope using 70 and 12 degree lenses. Initially there was only mild hyperemia and erythema and following distention of the bladder, she developed rather severe submucosal hemorrhages over the trigone and peritrigonal areas. The remainder of the bladder developed rather marked hyperemia and erythema but there was no cracking or bleeding. No stone, tumor, nor ulcer was noted. The right and left ureteral orifices were normal. The patients bladder was emptied and irrigated with 1000 cc of Clorpactin solution and emptied again.  PLAN:  For the patient to go home on Cipro 500 b.i.d., Darvocet-N 100 p.r.n. for pain. She is to return to the office in 2 weeks and to call us with any problems. DD:  01/07/00 TD:  01/07/00 Job: [id number]

## 2010-08-27 NOTE — H&P (Signed)
[facility]  Patient:    Brittany [name], Brittany [name]                        MRN: [id number] Attending:  Radene Knee., M.D.                         History and Physical  REASON FOR ADMISSION:  This 77 year old female is scheduled for cystoscopy, hydrodistention and Clorpactin irrigation of the bladder on April 06, 2000, with a chief complaint of painful frequent urination and low back pain.  PRESENT ILLNESS:  This 77 year old female has been followed in our office since 1995 with recurrent episodes of painful frequent urination associated with low back pain.  The patient had previously been seen by Dr. Marta Lamas in 1990 with similar episodes and initially responded to [facility] treatments but more recently failed to respond to these treatments and has required cystoscopies and hydrodistentions every three to six months; her last treatment was in September of 2001.  She did reasonably well until the last three weeks.  She had increasing pain the low back and dysuria, sore bladder, voiding every hour, but only getting up once a night.  Her previous bladder capacity has been 800 ml.  She is scheduled April 06, 2000 at [facility] for cystoscopy, hydrodistention and Clorpactin.  ALLERGIES:  MACROBID, MORPHINE, CODEINE, KEFLEX.  PRESENT MEDICATIONS:  Fosamax, Prevacid, Premarin, Elmiron, atenolol, Librax and Darvocet-N.  PAST MEDICAL HISTORY:  The past medical history reveals that she has had PAT which has been controlled in the past with Inderal.  She uses Tagamet for peptic ulcer disease and has been told she has a hiatal hernia.  PAST SURGICAL PROCEDURES:  Gallbladder, abdominal hysterectomy, vein ligation, finger surgery, multiple cystoscopies and hydrodistentions.  REVIEW OF SYSTEMS:  General health has been fair.  Weight has been a problem in recent years.  HEENT:  Unremarkable but she does have hayfever.  Denies asthma.  CARDIORESPIRATORY:  She  denies any chest pain or heart attack but she does have PAT.  Stopped smoking seven or eight years ago.  GI:  She has history of peptic ulcer disease, hiatal hernia, but no recent symptoms and her bowels have been moving normally, sometimes tend to be a little loose and there has been a question of irritable bowel syndrome but no bloody or tarry stools and no hepatitis.  BONES, JOINTS AND MUSCLES:  Unremarkable except for arthritis in her fingers and hands.  NEUROPSYCHIATRIC:  No history of stroke, fainting, falling-out spells.  FAMILY HISTORY:   The family history is positive for heart disease, alcoholism but negative for diabetes, stones, cancer and hypertension and no bleeders in the family to her knowledge.  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  She quit smoking eight years ago.  PHYSICAL EXAMINATION  GENERAL:  Well-developed, well-nourished 77 year old female mildly obese.  VITAL SIGNS:  Temperature is 98, the pulse 78, the respirations 16, the blood pressure 160/80.  HEENT:  The ears and tympanic membranes are unremarkable.  Eyes react normally to light and accommodation.  Extraocular movements intact.  Pharynx benign. Teeth in fair condition.  NECK:  No enlargement of thyroid.  No nodes are palpable.  CHEST:  Clear to percussion and auscultation.  HEART:  Normal sinus rhythm with a systolic murmur.  ABDOMEN:  Mildly obese.  No masses or tenderness except  in the suprapubic area, where she is quite tender.  She has a right paramedian incision, low midline incision, right lower quadrant incision and laparoscopic cholecystectomy scars.  BREASTS:  Not examined.  PELVIC AND RECTAL:  Examinations reveal a very tender bladder with fairly good rectal tone.  No rectal or pelvic masses.  EXTREMITIES:  No edema.  Good peripheral pulses.  NEUROLOGIC:  Grossly normal reflexes and sensation.  IMPRESSION 1.  Interstitial cystitis. 2. Recurrent urinary tract infections. 3. Paroxysmal atrial tachycardia. 4. Hypertension.  PLAN:  Cystoscopy, hydrodistention, Clorpactin irrigation of bladder at [facility], April 06, 2000. DD:  04/05/00 TD:  04/05/00 Job: [id number]

## 2010-08-27 NOTE — H&P (Signed)
[facility]  Patient:    Brittany [name], Brittany [name] Visit Number: [id number] MRN: [id number]          Service Type: Attending:  Radene Knee., M.D. Dictated by:   Radene Knee., M.D. Adm. Date:  02/02/01                           History and Physical  CHIEF COMPLAINT:  This 77 year old female is scheduled February 02, 2001, [facility] for cystoscopy, hydrodistention, Clorpactin, and irrigation of the bladder with a chief complaint of painful frequent urination.  HISTORY OF PRESENT ILLNESS:  This 77 year old female has been followed in our office since 1995, with recurrent urinary tract infections, painful frequent urination associated with low back pain.  She had previously seen Dr. Dannette Barbara, had been diagnosed with interstitial cystitis, and has required hydrodistention, Clorpactin about every six months since 1995.  She had done well, her last cysto Clorpactin being Aug 11, 2000, until about four weeks ago she began having increasing low back pain, pain in the bladder with dysuria, frequency, and urgency, and increased urgency incontinence.  She has not passed any gross blood, gravel, or stone.  Her urinalysis today in the office was normal.  Her bladder was quite tender, and she had taken Cipro without any improvement of her symptoms.  She had tried Ditropan XL for her urgency incontinence which did help, but it upset her GI tract, and she was unable to tolerate this medication.  ALLERGIES:  MACROBID, MORPHINE, CODEINE, KEFLEX.  CURRENT MEDICATIONS:  Fosamax, Prevacid, Premarin, Elmiron, atenolol, Librax, Darvocet-N, and she has temporarily stopped Prevacid to try Nexium.  PAST MEDICAL HISTORY:  PAT.  Heart murmur.  The PAT has been controlled with Inderal.  She uses the Nexium or Pepcid for her peptic ulcer symptoms.  Has been told that she has a hiatal hernia as well.  PAST SURGICAL HISTORY: 1. Gallbladder surgery. 2. Abdominal  hysterectomy. 3. Vein ligation. 4. Finger surgeries. 5. Multiple cystoscopies with hydrodistentions.  FAMILY HISTORY:  Positive for heart disease, alcoholism.  Negative for diabetes, stones, cancer, hypertension, and bleeders.  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  Quit smoking eight or nine years ago.  She does enjoy traveling and golf.  REVIEW OF SYSTEMS:  General health has been fairly good.  Weight has been a problem in recent years.  HEENT:  Unremarkable, except for hay fever.  Denies asthma.  CARDIORESPIRATORY:  No chest pain, heart attack, but she does have PAT.  Stopped smoking about eight years ago and has a little shortness of breath with exertion, but this is not really a problem since she is not all that active.  GASTROINTESTINAL:  She does have irritable bowel syndrome and peptic ulcer disease.  No history of hepatitis and, apparently, these symptoms were aggravated by the Ditropan XL.  BONES, JOINES, MUSCLES:  Unremarkable except for arthritis in the fingers and hands.  NEUROPSYCHIATRIC:  No history of stroke, fainting, falling-out spells.  LYMPHATIC:  No nodes noted.  SKIN: No rashes or new skin lesions.  PHYSICAL EXAMINATION:  GENERAL:  Well-developed, moderately obese 77 year old female.  VITAL SIGNS:  Temperature 98, pulse 78, respirations 16, blood pressure 160/80.  HEENT:  Ears and tympanic membranes unremarkable.  Eyes react normally to light and accommodation.  Extraocular movements intact.  Pharynx benign. Teeth in fair condition.  NECK:  No enlargement of thyroid.  No nodes palpable.  CHEST:  Clear to percussion and auscultation.  HEART:  Normal sinus rhythm.  Grade 2 systolic murmur.  BREASTS:  Not examined.  ABDOMEN:  Mildly to moderately obese.  Liver, kidney, spleen masses not detected.  There is suprapubic tenderness.  No hernias noted.  She has a low midline scar, right lower  quadrant scar, laparoscopic cholecystectomy scars, and a right paramedian scar.  PELVIC, RECTAL:  External urethral meatus and vulva are normal.  The bladder is very tender.  The support is poor to the bladder and urethra.  No rectal or pelvic mass noted.  Rectal tone is good.  EXTREMITIES:  No edema.  Good peripheral pulses.  NEUROLOGIC:  Grossly normal reflexes and sensation.  IMPRESSION: 1. Interstitial cystitis. 2. Recurrent urinary tract infections. 3. Paroxysmal atrial tachycardia with systolic murmur. 4. Hypertension.  PLAN:  Cystoscopy, hydrodistention, Clorpactin, and irrigation of the bladder under anesthesia, scheduled February 02, 2001. Dictated by:   Radene Knee., M.D. Attending:  Radene Knee., M.D. DD:  01/30/01 TD:  01/31/01 Job: [id number]

## 2010-08-27 NOTE — Op Note (Signed)
NAME:  Brittany [name], Brittany [name]                         ACCOUNT NO.:  [id number]   MEDICAL RECORD NO.:  [id number]                   PATIENT TYPE:  INP   LOCATION:  2302                                 FACILITY:  [facility]   PHYSICIAN:  Gwenith Daily. Tyrone Sage, M.D.            DATE OF BIRTH:  Jan 08, 1928   DATE OF PROCEDURE:  09/29/2003  DATE OF DISCHARGE:                                 OPERATIVE REPORT   PREOPERATIVE DIAGNOSIS:  Critical aortic stenosis.   POSTOPERATIVE DIAGNOSIS:  Critical aortic stenosis.   SURGERY/PROCEDURE:  Aortic valve replacement with a #21 pericardial tissue  valve.   SURGEON:  Gwenith Daily. Tyrone Sage, M.D.   FIRST ASSISTANT:  Carolyn A. Eustaquio Boyden.   BRIEF HISTORY:  The patient is a 77 year old female with increasing symptoms  of congestive heart failure and angina with echo evidence of significant  aortic stenosis which has been progressive.  The patient has been followed  by Dr. Meade Maw and because of progressive symptoms cardiac  catheterization was performed which showed luminal irregularities, but no  high grade stenosis in the coronary system and confirmed aortic valve area  of approximately 1.  Because of  the patient's symptoms, aortic valve  replacement was recommended.  The patient agreed and signed informed  consent.   DESCRIPTION OF PROCEDURE:  With Swan-Ganz and arterial line monitors in  place, the patient underwent general endotracheal anesthesia without  incident.  The skin of the chest and legs were prepped with Betadine and  draped in the usual sterile manner.  Median sternotomy was performed.  Pericardium was opened.  Overall ventricular function appeared preserved.  This was confirmed with TEE.  The patient was systemically heparinized.  Ascending aorta and the right atrium were cannulated and the aortic root.  Then the cardioplegia needle was introduced into the ascending aorta.  The  patient was placed on cardiopulmonary bypass 2.4  liters/minute/sq m.  A  right superior pulmonary vein vent was placed.  The patient's body  temperature was cooled to 28 degrees.  Aortic cross-clamp was applied.  500  mL of cold cardioplegia was administered with rapid diastolic rest of the  heart.  Myocardial septal temperature was monitored at the cross clamp.  Attention was turned to the aorta where a transverse aortotomy was  performed.  The valve appeared as it did on echo to be a trileaflet aortic  valve with significant scarring and calcification involving all three  leaflets especially on the edges.  The valve was incised for 21 pericardial  tissue valve.  The tissue valve was selected.  An [employer]  pericardial tissue valve model 2700, size 21 mm, serial number [id number].  A #2  Ti-Cron pledgeted sutures with pledgets on the ventricular surface were  placed circumferentially around the aortic annulus.  The valve was then  secured in place and seated well.  Both left and right coronary ostium were  inspected and  were free of any obstruction.  Aortotomy was closed with  horizontal mattress 3-0 Prolene suture.  Prior to complete closure, the  heart was allowed to passively fill and deair and the aortotomy closure was  completely.  The aortic cross clamp was then removed and a 16-guage needle  was introduced into the left ventricular apex to further deair the heart.  The patient spontaneously converted to a sinus rhythm after removal of cross  clamp.  The patient remained hemodynamically stable and was ventilated and  decannulated in the usual fashion.  Protamine sulfate was administered with  operative field hemostatic.  Two atrial,  two ventricular pacing wires applied.  The sternum was closed with #6  stainless steel wire.  Fascia closed with interrupted 0 Vicryl, running 3-0  Vicryl and subcutaneous tissue 4-0 subcuticular stitch in the skin edges.  Dry dressings were applied.  The patient was transferred to surgical   intensive care unit for further postoperative care.                                               Gwenith Daily Tyrone Sage, M.D.    Tyson Babinski  D:  09/29/2003  T:  09/29/2003  Job:  [id number]   cc:   Meade Maw, M.D.  [address]  [place]  [place] [address]  Fax: [phone]

## 2010-08-27 NOTE — Op Note (Signed)
NAME:  Brittany [name], Brittany [name]                         ACCOUNT NO.:  [id number]   MEDICAL RECORD NO.:  [id number]                   PATIENT TYPE:  AMB   LOCATION:  N[facility]C                                 [facility]ACILITY:  [facility]   PHYSICIAN:  Ronald L. Ovidio Hanger, M.D.           DATE OF BIRTH:  Jun 02, 1927   DATE OF PROCEDURE:  07/30/2002  DATE OF DISCHARGE:                                 OPERATIVE REPORT   PREOPERATIVE DIAGNOSES:  Type 3 urinary incontinence.   POSTOPERATIVE DIAGNOSES:  Type 3 urinary incontinence.   OPERATION:  Cystourethroscopy, transvaginal tape (TVT).   SURGEON:  Lucrezia Starch. Earlene Plater, M.D.   ANESTHESIA:  General laryngeal airways.   ESTIMATED BLOOD LOSS:  75 mL.   TUBES:  16 French Foley.   COMPLICATIONS:  None.   INDICATIONS FOR PROCEDURE:  Brittany [name] is a lovely 77 year old white female  who presented with significant incontinence. She also has a history of  interstitial cystitis. She underwent a straining cystogram which revealed  descensus just above the inferior border of the pubic symphysis with  significant beaking. On urodynamics, she was found to have a Valsalva leak  point pressure of 30 cm of water with gushing but otherwise a neurologically  intact bladder. Her first sensation was slightly delayed at 230 mL, but then  a third sensation was at 250 mL. She again has known interstitial cystitis.  After understanding the risks, benefits, and alternatives, she elected to  proceed with TVT.   DESCRIPTION OF PROCEDURE:  The patient was placed in the supine position  after proper general laryngeal airway anesthesia was placed in the dorsal  lithotomy position and prepped and draped with Betadine in a sterile  fashion. A 16 French Foley catheter was placed, the bladder was drained, no  significant urine within it. Appropriate puncture sites were made one  fingerbreadth superior and two fingerbreadths lateral to the midline  superior border of the pubic symphysis and  marked. The vaginal mucosa at the  urethrovesical junction was injected with 8 mL and 1% Xylocaine with  epinephrine and a midline incision was made over the urethrovesical  junction. Vaginal flaps were created bilaterally and then the pelvic fascia  could be palpated very well under the pubis. Utilizing the puncture needle  from above, both punctures were placed and felt to be in good position on  cystourethroscopy. The right needle was noted to be tangentially entering  the mucosa of the bladder. This was removed, the bladder was drained,  repuncture just very slightly lateral was performed and on reinspection with  the cystoscope with the 12 and 70 degree lenses, it was noted to be no more  puncture and the tiny puncture holes wee well sealed. Utilizing the  connector devices, TVT tape was then placed. The TVT tape was pulled in  position and again cystourethroscopy was performed with the slides over the  tape and again there was no  bladder injury noted. Efflux of clear urine was  noted bilaterally and the urethra was totally intact and the tape was  deployed in the usual manner with the slides removed over a hemostat and the  tape was noted to fall in place well deployed, no significant tension noted  at the urethrovesical junction. A thorough irrigation was performed, good  hemostasis was noted to be present, vaginal mucosa was closed with a running  2-0 Vicryl suture and the bladder again was inspected with the cystoscope  and again was totally intact. On Valsalva maneuver, slight urine was  expressed and we felt that the tape was in good position. A 16 French Foley  was placed in the bladder and will be left in for at least thee days because  of the bladder puncture. The tapes were cut so they were below the skin, the  skin was closed with Dermabond and a vaginal pack was placed with Bacitracin  ointment. The patient was taken to the recovery room stable.                                                 Ronald L. Ovidio Hanger, M.D.    RLD/MEDQ  D:  07/30/2002  T:  07/30/2002  Job:  [id number]

## 2010-08-27 NOTE — Op Note (Signed)
[facility]  Patient:    Brittany [name], Brittany [name]                      MRN: [id number] Proc. Date: 08/11/00 Adm. Date:  [id number] Attending:  Dalbert Mayotte                           Operative Report  PREOPERATIVE DIAGNOSES: 1. Interstitial cystitis. 2. Recurrent urinary tract infections. 3. Hypertension. 4. Paroxysmal atrial tachycardia controlled with atenolol.  POSTOPERATIVE DIAGNOSES: 1. Interstitial cystitis. 2. Recurrent urinary tract infections. 3. Hypertension. 4. Paroxysmal atrial tachycardia controlled with atenolol.  OPERATION PERFORMED:  Cystoscopy, hydrodistention, Clorpactin irrigation of the bladder.  DESCRIPTION OF PROCEDURE:  This 77 year old female brought to the operating room underwent successful induction of general anesthesia received IV gentamycin for prophylaxis against infection 80 mg. After satisfactory induction of anesthesia, the patient was prepped and draped in the lithotomy position and prepped and draped in the usual fashion. The bladder was inspected with a #22 cystourethroscope using 70 and 12 degree lenses. Initially there was only mild hyperemia and erythema, no stone, tumor, nor ulcer was noted. The right and left ureteral orifices were normal. The bladder was filled to capacity which was 700 ml and decompressed. She developed severe hyperemia and erythema throughout the bladder and moderately severe petechial hemorrhages over the trigone. There was no cracking or bleeding, no stone, tumor or ulcer was noted. The right and left ureteral orifices were normal. The bladder was then emptied and irrigated with 1000 cc of Clorpactin solution, emptied again and the patient returned to the recovery area in a stable condition.  The plan is for the patient to go home on Cipro 500 b.i.d. She is to have Darvocet-N 100 p.r.n. for pain. She will continue her regular medications. See [place] in the office in two weeks. DD:   08/11/00 TD:  08/11/00 Job: [id number]

## 2010-08-27 NOTE — Discharge Summary (Signed)
NAME:  Brittany [name], Brittany [name]                         ACCOUNT NO.:  [id number]   MEDICAL RECORD NO.:  [id number]                   PATIENT TYPE:  INP   LOCATION:  2040                                 FACILITY:  [facility]   PHYSICIAN:  Gwenith Daily. Tyrone Sage, M.D.            DATE OF BIRTH:  02/12/1928   DATE OF ADMISSION:  09/29/2003  DATE OF DISCHARGE:  10/06/2003                                 DISCHARGE SUMMARY   ADDENDUM:  This is an addendum to discharge summary [phone].   [name] planned discharge for October 05, 2003 was canceled due to rapid  atrial fibrillation in the over night period of June 25th.  She was treated  with IV amiodarone.  She converted to normal sinus rhythm.  By the morning  of June 26th she remained in sinus rhythm, amiodarone was changed to p.o.  dosing.  She maintained normal sinus rhythm for the next 24 hours.  On the  morning of October 06, 2003, postoperative day 7, on morning rounds she reports  feeling very well, vital signs are stable at 121/58, she is afebrile and her  heart is in normal sinus rhythm at 92 beats per minute.  Her lungs are  slightly decreased on the right and otherwise clear.  She continues to  tolerate her diet without any nausea.  Her bowel and bladder functions are  within normal limits for her.  She had to be started on Ditropan for her  interstitial cystitis and she is having no symptoms at this time.  I saw her  Elmiron has not been restarted, she has been recommended to hold on  restarting that until further notice.  Her rash is stable, [name] reports  that she has a dermatologist, Dr. Terri Piedra, and she will make an appointment  if the rash continues over the next several days.  Ms. Kary PT this  morning is 17.4, INR 1.7.  She was ready for discharge home this morning, October 06, 2003.   CONDITION ON DISCHARGE:  Improved.   DISCHARGE MEDICATIONS:  All medications the same as in previous dictation  with the exception of amiodarone 200 mg p.o.  daily, and Elmiron (this was  not restarted in the hospital and she has been recommended to not restart it  at home at this time).   All other instructions and appointments remain the same.      Toribio Harbour, N.P.                  Gwenith Daily Tyrone Sage, M.D.    CTK/MEDQ  D:  10/06/2003  T:  10/07/2003  Job:  [id number]   cc:   Meade Maw, M.D.  [address]  [place]  [place] [address]  Fax: [phone]

## 2010-08-27 NOTE — Op Note (Signed)
[name], [name]               ACCOUNT NO.:  [id number]   MEDICAL RECORD NO.:  [id number]          PATIENT TYPE:  AMB   LOCATION:  NESC                         FACILITY:  [facility]   PHYSICIAN:  Excell Seltzer. Annabell Howells, M.D.    DATE OF BIRTH:  May 21, 1927   DATE OF PROCEDURE:  11/11/2004  DATE OF DISCHARGE:                                 OPERATIVE REPORT   PROCEDURE:  Cystoscopy, hydrodistention of the bladder, urethral dilation,  installation of Clorpactin, Pyridium and Marcaine.   PREOPERATIVE DIAGNOSIS:  Interstitial cystitis.   POSTOPERATIVE DIAGNOSIS:  Interstitial cystitis.   SURGEON:  Dr. Bjorn Pippin.   ANESTHESIA:  General.   SPECIMEN:  None.   COMPLICATIONS:  None.   INDICATIONS:  Brittany [name] is a 77 year old white female with a history of  interstitial cystitis. She requires intermittent hydrodistentions and  Clorpactin for symptom relief.   FINDINGS AND PROCEDURE:  The patient was taken to the operating room after  receiving p.o. Cipro. A general anesthetic was induced, she was placed in  lithotomy position, her perineum and genitalia were prepped with Betadine  solution. She was draped in the usual sterile fashion.   Cystoscopy was performed in the usual fashion with a 22-French scope and 70  degrees lens. Examination revealed a normal urethra. The bladder wall had  mild trabeculation. There was a scar in the left base posterior to the  orifice consistent with a prior biopsy site. No tumor, stones or  inflammation were noted.   The cystoscope was removed and a 16-French Foley catheter was placed. The  bladder was then filled under 80 cmH2O pressure to capacity. This was held  for five minutes and the bladder was then drained.   Repeat cystoscopy revealed a few glomerular hemorrhages but they were not  extensive. Her capacity under anesthesia was 1250 mL.   The Foley catheter was then reinserted and her bladder was filled with 250  mL of Clorpactin solution, 1 amp  diluted in 1 liter of sterile water. This  was left indwelling for 3-4 minutes. The bladder was then drained. The  urethra was then calibrated with female sounds to 21 [place]. She had minimal  narrowing.   The bladder was then instilled with 30 mL of 0.25% percent Marcaine with 400  mg of crushed Pyridium. This was left indwelling.   The patient was taken down from lithotomy position, her anesthetic was  reversed, she was moved to the recovery room in stable condition. There were  no complications.       JJW/MEDQ  D:  11/11/2004  T:  11/11/2004  Job:  [id number]   cc:   Donia Guiles, M.D.  [address] [place]  [place] [address]  Fax: [phone]

## 2010-08-27 NOTE — Op Note (Signed)
   NAME:  Brittany [name], Brittany [name]                         ACCOUNT NO.:  [id number]   MEDICAL RECORD NO.:  [id number]                   PATIENT TYPE:  AMB   LOCATION:  NESC                                 FACILITY:  [facility]   PHYSICIAN:  Lynnea Ferrier., M.D.         DATE OF BIRTH:  Mar 17, 1928   DATE OF PROCEDURE:  01/11/2002  DATE OF DISCHARGE:                                 OPERATIVE REPORT   PREOPERATIVE DIAGNOSES:  1. Interstitial cystitis.  2. Recurrent urinary tract infections.  3. Paroxysmal atrial tachycardia with abnormal EKG.  4. Hypertension.   POSTOPERATIVE DIAGNOSES:  1. Interstitial cystitis.  2. Recurrent urinary tract infections.  3. Paroxysmal atrial tachycardia with abnormal EKG.  4. Hypertension.   OPERATION PERFORMED:  1. Cystoscopy.  2. Hydrodistention and Clorpactin irrigation of the bladder.   DESCRIPTION OF OPERATION:  This patient, age 77, brought to the operating  room after receiving 80 mg IV gentamycin and underwent successful induction  of general anesthesia, was prepped and draped in the lithotomy position.  The bladder was inspected with a #22 cystourethroscope using 70 and 12  degree lenses.  Initially, there was only mild hyperemia and erythema, and  no stone, tumor, nor ulcer was noted in the bladder.  The right and left  ureteral orifices were normal.  The bladder was then distended to capacity,  and she developed rather severe petechial hemorrhages over the trigone.  There was no cracking or bleeding.  The patient's bladder was emptied.  She  was irrigated with 1000 cc of Clorpactin solution, and her bladder was again  emptied.  She was returned to recovery area in a stable condition.   The plan is for the patient to go home with Darvocet-N 100 q.6-8h. p.r.n.  for pain.  She has Cipro 500 b.i.d. for a couple of days to prevent  infection.  She will see [place] in the office in 1-2 weeks.  She will continue  her medications by Dr. Arvilla Market.                                             Lynnea Ferrier., M.D.    Willia Craze  D:  01/11/2002  T:  01/11/2002  Job:  [id number]

## 2010-08-27 NOTE — H&P (Signed)
[facility]  Patient:    Brittany [name], Brittany [name]                        MRN: [id number] Attending:  Radene Knee., M.D.                         History and Physical  This 77 year old female is scheduled for cystoscopy, hydrodistention, Clorpactin irrigation of the bladder on Aug 11, 2000 with the chief complaint of painful, frequent urination.  HISTORY OF PRESENT ILLNESS:  This 77 year old female has been followed in our office since 1995 with recurrent episodes of painful, frequent urination associated with low back pain.  Patient had seen Dr. Dannette Barbara previously since 1990 with similar episodes and initially responded to ______ treatment, but more recently failed to respond to these treatments and required cystoscopy with hydrodistention and Clorpactin every three to six months.  Her last treatment was December 2001.  Capacity at that time was 750 ml and she did reasonably well until the last two weeks when she has had increasing urgency, frequency, dysuria, voiding every hour, getting up three to four times a night, and having more or less continuous pain in the bladder and low back. She did not pass any blood, gravel, or stone.  When seen in our office on August 08, 2000 the urinalysis was normal.  The bladder was very tender.  A culture was obtained.  She was continued on Cipro 500 b.i.d., scheduled for hydrodistention and Clorpactin on Aug 11, 2000 at [facility].  ALLERGIES:  MACROBID, MORPHINE, CODEINE, KEFLEX.  CURRENT MEDICATIONS:  Fosamax, Prevacid, Premarin, Elmeron, atenolol, Librax, Darvocet-N.  She reports that the Prevacid has been changed to Nexium recently.  PAST MEDICAL HISTORY:  Reveals that the patient has had PAT.  Has had a history of a heart murmur and this has been controlled with Inderal in the past.  She uses Tagamet for peptic ulcer disease when she needs it and has been told that she had a hiatal hernia.  PAST SURGICAL HISTORY:   Gallbladder surgery, abdominal hysterectomy, vein ligation, finger surgery, multiple cystoscopies and hydrodistentions.  REVIEW OF SYSTEMS:  GENERAL:  Health has been fair.  Weight has been a problem in recent years.  HEENT:  Unremarkable.  She does have hay fever.  Denies asthma.  CARDIORESPIRATORY:  She denies any chest pain, heart attack, but has had PAT.  She stopped smoking some eight years ago.  GASTROINTESTINAL:  She has a history of peptic ulcer disease, hiatal hernia, but no recent symptoms and has passed no bloody or tarry stools.  She has done better on the Nexium, she thinks.  There has been a question of irritable bowel syndrome in the past, but no hepatitis.  BONES, JOINTS, MUSCLES:  Unremarkable except for arthritis in the fingers and hands.  NEUROLOGIC/PSYCHIATRIC:  No history of stroke, fainting, falling out spells.  FAMILY HISTORY:  Positive for heart disease, alcoholism, but negative for diabetes, stones, cancer, hypertension and to her knowledge no bleeders run in the family.  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  Quit smoking eight or nine years ago.  PHYSICAL EXAMINATION  GENERAL:  Well-developed, well-nourished 77 year old female mildly obese.  VITAL SIGNS:  Temperature 98, pulse 78, respirations 16, blood pressure 160/80.  HEENT:  Ears and tympanic membranes are unremarkable.  Eyes react normally to light and accommodation.  Extraocular movements are intact.  Pharynx:  Benign. Teeth in fair condition.  NECK:  No enlargement of thyroid.  No nodes palpable.  CHEST:  Clear to percussion, auscultation.  HEART:  Normal sinus rhythm with systolic murmur grade 2.  ABDOMEN:  Mildly obese.  No masses or tenderness.  Liver, kidney, spleen:  Not felt.  No hernia detected.  She does have rather marked suprapubic tenderness. There is a right paramedian incision, low midline incision, right lower quadrant  incision and laparoscopic cholecystectomy scars.  BREASTS:  Not examined.  PELVIC:  Very tender bladder.  No pelvic masses.  RECTAL:  Fairly good rectal tone.  No rectal masses.  EXTREMITIES:  No edema.  Good peripheral pulses.  NEUROLOGIC:  Grossly normal reflexes and sensation.  IMPRESSION: 1. Interstitial cystitis. 2. Recurrent urinary tract infections. 3. PAT. 4. Hypertension.  PLAN:  Cystoscopy, hydrodistention, Clorpactin irrigation of bladder Aug 11, 2000. DD:  08/08/00 TD:  08/08/00 Job: [id number]

## 2010-08-27 NOTE — Op Note (Signed)
NAMEALTAIR, Brittany [name]               ACCOUNT NO.:  [id number]   MEDICAL RECORD NO.:  [id number]          PATIENT TYPE:  INP   LOCATION:  3004                         FACILITY:  [facility]   PHYSICIAN:  Danae Orleans. Venetia Maxon, M.D.  DATE OF BIRTH:  November 30, 1927   DATE OF PROCEDURE:  10/21/2005  DATE OF DISCHARGE:  10/22/2005                                 OPERATIVE REPORT   PREOPERATIVE DIAGNOSIS:  Right L4-L5 foraminal stenosis with spondylosis and  degenerative disk disease and radiculopathy.   POSTOPERATIVE DIAGNOSIS:  Right L4-L5 foraminal stenosis with spondylosis  and degenerative disk disease and radiculopathy.   PROCEDURE:  Right L4-L5 foraminotomy with microdissection.   SURGEON:  Danae Orleans. Venetia Maxon, M.D.   ASSISTANTZigmund Daniel, MD   ANESTHESIA:  General endotracheal anesthesia.   ESTIMATED BLOOD LOSS:  Minimal.   COMPLICATIONS:  None.   DISPOSITION:  To recovery.   INDICATIONS:  Brittany [name] is a 77 year old woman with right lower  extremity pain in the L5 distribution.  She has foraminal stenosis affecting  the L5 nerve root with shallow foraminal disk herniation.  It ws elected to  perform a foraminotomy with decompression of the L5 nerve root. She had  previous multiple injections which gave her some transient relief but did  not give her any long-term relief of her right leg pain.   DESCRIPTION OF PROCEDURE:  Brittany [name] was brought to the operating room.  Following satisfactory of uncomplicated induction of general endotracheal  anesthesia placed.  The patient was placed in the prone position on the  Wilson frame.  Her low back was then prepped and draped in the usual sterile  fashion.  The area of planned incision was then infiltrated with 0.25%  Marcaine and 0.5% lidocaine with 1:200,000 epinephrine. Incision was made  and opened in the midline overlying the L4-L5 level, sharply through to the  lumbar dorsal fascia, was incised on the right side of midline.  Subperiosteal  dissection was performed exposing what was felt to be the L4-  L5 interspace and intraoperative x-ray confirmed this to be the L4-L5 level.  Subsequently hemi-semi-laminectomy of L4 was performed and a generous  foraminotomy over the superior aspect of the lamina of L5 was performed and  medial facetectomy was also performed using Kerrison rongeurs under loop  magnification.  The foraminotomies were completed after drilling the bone  and the ligamentous tissue was detached and removed in a piecemeal fashion.  The microscope was brought into the field, the using the operating  microscope, the floor of the canal was palpated.  There was a disk bulge  which was causing some pressure on the L5 nerve root, but after performing  foraminotomy it was felt that this pressure was sufficiently relieved and  there did not appear to be residual mass effect or nerve root compression.  Hemostasis was assured with Gelfoam soaked in thrombin and subsequently the  operative bed was bathed in Depo-Medrol and fentanyl.  The microscope was  taken out of the field.  The lumbar dorsal fascia was closed with 0 Vicryl  sutures.  Subcutaneous tissues were  approximate with 2-0 Vicryl interrupted  inverted sutures.  The skin edges were approximated with interrupted 3-0  Vicryl subcuticular stitch.  The wound was dressed with Dermabond.  The  patient was extubated in the operating room  and returned to the recovery  room in stable and satisfactory condition, having tolerated the operation  well.  All counts were correct at the end of the case.      Danae Orleans. Venetia Maxon, M.D.  Electronically Signed     JDS/MEDQ  D:  10/21/2005  T:  10/22/2005  Job:  [id number]

## 2011-01-12 LAB — POCT I-STAT 4, (NA,K, GLUC, HGB,HCT)
Glucose, Bld: 97
Sodium: 139

## 2011-01-18 ENCOUNTER — Ambulatory Visit: Payer: Self-pay | Admitting: Cardiovascular Disease

## 2011-01-21 LAB — BASIC METABOLIC PANEL
Chloride: 103
GFR calc Af Amer: >60
Potassium: 4.8

## 2011-01-21 LAB — POCT HEMOGLOBIN-HEMACUE: Hemoglobin: 12.8

## 2011-02-22 ENCOUNTER — Ambulatory Visit: Payer: Self-pay | Admitting: Cardiovascular Disease

## 2011-03-24 ENCOUNTER — Encounter: Payer: Self-pay | Admitting: Cardiovascular Disease

## 2011-03-24 ENCOUNTER — Ambulatory Visit (INDEPENDENT_AMBULATORY_CARE_PROVIDER_SITE_OTHER): Payer: Medicare Other | Admitting: Cardiovascular Disease

## 2011-03-24 DIAGNOSIS — I350 Nonrheumatic aortic (valve) stenosis: Secondary | ICD-10-CM | POA: Insufficient documentation

## 2011-03-24 DIAGNOSIS — I10 Essential (primary) hypertension: Secondary | ICD-10-CM | POA: Insufficient documentation

## 2011-03-24 DIAGNOSIS — I359 Nonrheumatic aortic valve disorder, unspecified: Secondary | ICD-10-CM

## 2011-03-24 NOTE — Assessment & Plan Note (Signed)
She's doing very well from a vascular standpoint. She's not having episodes of chest pain or shortness breath.

## 2011-03-24 NOTE — Progress Notes (Signed)
    Brittany [name] Date of Birth  01/27/1928 [facility] [address]    [address] [place], [place]  [address] [phone]  Fax  [phone]  History of Present Illness:  Brittany [name] is an 77 year old female with a history of a bioprosthetic aortic valve (2005) . She also has a history of dyslipidemia and hypertension. She's done fairly well since I last saw her. She has lost 38 pounds since her office visit last year. She's feeling very well.  Her husband, Derryl Harbor, died a month ago.  Current Outpatient Prescriptions on File Prior to Visit  Medication Sig Dispense Refill  . aspirin 81 MG tablet Take 81 mg by mouth daily.        Marland Kitchen atenolol (TENORMIN) 100 MG tablet Take 100 mg by mouth daily.        . Cholecalciferol (VITAMIN D PO) Take by mouth daily.        . Omega-3 Fatty Acids (FISH OIL PO) Take by mouth daily.        Marland Kitchen omeprazole (PRILOSEC) 20 MG capsule Take 20 mg by mouth daily.        Marland Kitchen oxybutynin (DITROPAN XL) 15 MG 24 hr tablet Take 15 mg by mouth 3 (three) times daily.       . simvastatin (ZOCOR) 20 MG tablet Take 10 mg by mouth at bedtime.         Allergies  Allergen Reactions  . Amoxicillin   . Codeine   . Keflex   . Lisinopril Cough  . Macrobid   . Morphine And Related     Past Medical History  Diagnosis Date  . Aortic valve replaced   . Dyslipidemia   . Hypertension   . Dizziness   . History of aortic stenosis     No past surgical history on file.  History  Smoking status  . Never Smoker   Smokeless tobacco  . Not on file    History  Alcohol Use: Not on file    No family history on file.  Reviw of Systems:  Reviewed in the HPI.  All other systems are negative.  Physical Exam: BP 137/73  Pulse 56  Ht 5\' 1"  (1.549 m)  Wt 140 lb (63.504 kg)  BMI 26.45 kg/m2 The patient is alert and oriented x 3.  The mood and affect are normal.   Skin: warm and dry.  Color is normal.    HEENT:   Normocephalic/atraumatic.  Chest normal  carotids.  Lungs: Clear to auscultation   Heart: Regular rate S1-S2. She has a very sharp S2. She has no significant murmur.    Abdomen: Good bowel sounds are normal. She is no hepatosplenomegaly.  Extremities:  No clubbing cyanosis or edema.  Neuro:  Neuro exam is nonfocal. Her gait is normal to    ECG: Sinus bradycardia. She has a first-degree AV block. There is left axis deviation. Assessment / Plan:

## 2011-03-24 NOTE — Patient Instructions (Signed)
Your physician wants you to follow-up in: 1 year or sooner if needed, You will receive a reminder letter in the mail two months in advance. If you don't receive a letter, please call our office to schedule the follow-up appointment. 

## 2011-03-24 NOTE — Assessment & Plan Note (Signed)
Her blood pressure is well controlled. Continue current meds. 

## 2011-04-18 DIAGNOSIS — R1032 Left lower quadrant pain: Secondary | ICD-10-CM | POA: Diagnosis not present

## 2011-04-19 ENCOUNTER — Other Ambulatory Visit: Payer: Self-pay | Admitting: Family Medicine

## 2011-04-19 DIAGNOSIS — R1032 Left lower quadrant pain: Secondary | ICD-10-CM

## 2011-04-20 ENCOUNTER — Ambulatory Visit
Admission: RE | Admit: 2011-04-20 | Discharge: 2011-04-20 | Disposition: A | Discharge status: Home / Self Care | Payer: Medicare Other | Source: Ambulatory Visit | Attending: Family Medicine | Admitting: Family Medicine

## 2011-04-20 DIAGNOSIS — R1032 Left lower quadrant pain: Secondary | ICD-10-CM | POA: Diagnosis not present

## 2011-04-20 DIAGNOSIS — R634 Abnormal weight loss: Secondary | ICD-10-CM | POA: Diagnosis not present

## 2011-04-20 MED ORDER — IOHEXOL 300 MG/ML  SOLN
100.0000 mL | Freq: Once | INTRAMUSCULAR | Status: AC | PRN
  Administered 2011-04-20: 100 mL via INTRAVENOUS

## 2011-04-28 DIAGNOSIS — Z1231 Encounter for screening mammogram for malignant neoplasm of breast: Secondary | ICD-10-CM | POA: Diagnosis not present

## 2011-05-30 DIAGNOSIS — M6530 Trigger finger, unspecified finger: Secondary | ICD-10-CM | POA: Diagnosis not present

## 2011-05-30 DIAGNOSIS — M19049 Primary osteoarthritis, unspecified hand: Secondary | ICD-10-CM | POA: Diagnosis not present

## 2011-06-06 ENCOUNTER — Emergency Department (HOSPITAL_COMMUNITY)
Admission: EM | Admit: 2011-06-06 | Discharge: 2011-06-06 | Disposition: A | Discharge status: Home / Self Care | Payer: Medicare Other | Attending: Emergency Medicine | Admitting: Emergency Medicine

## 2011-06-06 ENCOUNTER — Other Ambulatory Visit: Payer: Self-pay

## 2011-06-06 ENCOUNTER — Encounter (HOSPITAL_COMMUNITY): Payer: Self-pay | Admitting: Emergency Medicine

## 2011-06-06 ENCOUNTER — Emergency Department (HOSPITAL_COMMUNITY): Payer: Medicare Other

## 2011-06-06 ENCOUNTER — Telehealth: Payer: Self-pay | Admitting: Cardiovascular Disease

## 2011-06-06 DIAGNOSIS — E785 Hyperlipidemia, unspecified: Secondary | ICD-10-CM | POA: Insufficient documentation

## 2011-06-06 DIAGNOSIS — R0602 Shortness of breath: Secondary | ICD-10-CM | POA: Diagnosis not present

## 2011-06-06 DIAGNOSIS — Z9889 Other specified postprocedural states: Secondary | ICD-10-CM | POA: Insufficient documentation

## 2011-06-06 DIAGNOSIS — Z79899 Other long term (current) drug therapy: Secondary | ICD-10-CM | POA: Insufficient documentation

## 2011-06-06 DIAGNOSIS — Z7982 Long term (current) use of aspirin: Secondary | ICD-10-CM | POA: Insufficient documentation

## 2011-06-06 DIAGNOSIS — R1013 Epigastric pain: Secondary | ICD-10-CM | POA: Diagnosis not present

## 2011-06-06 DIAGNOSIS — R079 Chest pain, unspecified: Secondary | ICD-10-CM | POA: Diagnosis not present

## 2011-06-06 DIAGNOSIS — I10 Essential (primary) hypertension: Secondary | ICD-10-CM | POA: Insufficient documentation

## 2011-06-06 LAB — COMPREHENSIVE METABOLIC PANEL
ALT: 24 U/L (ref 0–35)
Alkaline Phosphatase: 80 U/L (ref 39–117)
BUN: 18 mg/dL (ref 6–23)
CO2: 30 mEq/L (ref 19–32)
Chloride: 98 mEq/L (ref 96–112)
GFR calc Af Amer: >90 mL/min (ref >90)
GFR calc non Af Amer: 83 mL/min — ABNORMAL LOW (ref >90)
Glucose, Bld: 156 mg/dL — ABNORMAL HIGH (ref 70–99)
Potassium: 3.7 mEq/L (ref 3.5–5.1)
Total Bilirubin: 0.4 mg/dL (ref 0.3–1.2)

## 2011-06-06 LAB — APTT: aPTT: 27 seconds (ref 24–37)

## 2011-06-06 LAB — CBC
Hemoglobin: 14.5 g/dL (ref 12.0–15.0)
MCH: 27.9 pg (ref 26.0–34.0)
RBC: 5.19 MIL/uL — ABNORMAL HIGH (ref 3.87–5.11)
WBC: 11.9 10*3/uL — ABNORMAL HIGH (ref 4.0–10.5)

## 2011-06-06 LAB — CARDIAC PANEL(CRET KIN+CKTOT+MB+TROPI)
CK, MB: 2.8 ng/mL (ref 0.3–4.0)
Troponin I: <0.3 ng/mL (ref <0.30)

## 2011-06-06 LAB — DIFFERENTIAL
Eosinophils Absolute: 0.2 10*3/uL (ref 0.0–0.7)
Lymphocytes Relative: 39 % (ref 12–46)
Lymphs Abs: 4.6 10*3/uL — ABNORMAL HIGH (ref 0.7–4.0)
Monocytes Relative: 10 % (ref 3–12)
Neutro Abs: 5.8 10*3/uL (ref 1.7–7.7)
Neutrophils Relative %: 49 % (ref 43–77)

## 2011-06-06 LAB — POCT I-STAT TROPONIN I: Troponin i, poc: 0 ng/mL (ref 0.00–0.08)

## 2011-06-06 MED ORDER — ASPIRIN 81 MG PO CHEW
324.0000 mg | CHEWABLE_TABLET | Freq: Once | ORAL | Status: AC
  Administered 2011-06-06: 324 mg via ORAL
  Filled 2011-06-06: qty 4

## 2011-06-06 MED ORDER — AMLODIPINE BESYLATE 10 MG PO TABS: 10.0000 mg | ORAL_TABLET | Freq: Every day | ORAL | Status: DC

## 2011-06-06 MED ORDER — NITROGLYCERIN IN D5W 200-5 MCG/ML-% IV SOLN
5.0000 ug/min | Freq: Once | INTRAVENOUS | Status: AC
  Administered 2011-06-06: 5 ug/min via INTRAVENOUS
  Filled 2011-06-06: qty 250

## 2011-06-06 MED ORDER — SODIUM CHLORIDE 0.9 % IV SOLN
INTRAVENOUS | Status: DC
  Administered 2011-06-06: 10:00:00 via INTRAVENOUS

## 2011-06-06 MED ORDER — ONDANSETRON HCL 4 MG/2ML IJ SOLN
4.0000 mg | Freq: Four times a day (QID) | INTRAMUSCULAR | Status: DC | PRN
  Filled 2011-06-06: qty 2

## 2011-06-06 MED ORDER — AMLODIPINE BESYLATE 10 MG PO TABS
10.0000 mg | ORAL_TABLET | Freq: Once | ORAL | Status: DC
  Filled 2011-06-06: qty 1

## 2011-06-06 NOTE — ED Notes (Signed)
Called dietary to order dinner tray for patient

## 2011-06-06 NOTE — ED Notes (Signed)
Pt started having mid-sternal chest pain that radiated to her left arm around 2030 last night. She woke up at 0600 this morning diaphoretic.  Pt took one baby asprin 0200 and another at 0600.  Denies n/v and has not had any of these symptoms before.  Pt stated she took her blood pressure at home this am and it was in the 200's.  Nothing makes the pain better or worse.  Pain is constant.

## 2011-06-06 NOTE — Telephone Encounter (Signed)
Patient called stating she has been having chest heaviness since 2:00 am this morning.States on a scale 1 to 10 her pain is a # 6.Also complains of elevated B/P 200/100.Patient advised to go to [facility] ER.Rosann Auerbach was called.

## 2011-06-06 NOTE — Consult Note (Signed)
Consult Note  Patient ID: CHAMIA SCHMUTZ MRN: [id number], SOB: 05/15/1927 78 y.o. Date of Encounter: 06/06/2011, 2:41 PM  Primary Physician: Lupita Raider, MD Primary Cardiologist: Dr. Elease Hashimoto  Chief Complaint: Chest pain  HPI: 78 y.o. female w/ PMHx significant for HTN, HLD, AS s/p bioprosthetic AVR '05, and normal coronaries by cath '05 who presented to [facility] on 06/06/2011 with complaints of chest pain.  Underwent cardiac cath in '05 for severe AS and it revealed normal coronaries. She has not had any cardiac issues since her tissue AVR at that time. She was last seen by Dr. Elease Hashimoto in Dec '12 at which time she was doing well. Her husband passed away in 02-25-2023 and the 35mo anniversary of his death was last Sep 13, 2022. She reports feeling depressed that day and stayed inside most of the weekend. On Sunday (yesterday) she went to church and ran some errands. When she got home she didn\'t "feel quite right" so she checked her BP and noted it to be in the 200s/100s. A few hours later she developed substernal chest pressure 6/10 with radiation to her left arm. It was constant throughout the night for which she took two baby ASA. In the morning she awoke diaphoretic and her BP was still elevated so she took a norvasc from an old prescription bottle and had her neighbor bring her to the ED. She was recently placed on a prednisone taper for trigger finger/arthritis and stopped in prematurely yesterday (day 6) because she hadn\'t been sleeping well. Otherwise denies recent illnesses, fever, chills, orthopnea, sob, nausea, edema, dizziness, visual changes, focal neuro deficits, changes in bladder or bowels. She has lost ~40lbs over the last year which she reports is bc she was taking care of her ailing husband and wasn\'t eating and reports she has been worked up by her PCP including an abd CT which was negative.  In the ED, EKG revealed NSR with no significant ST changes. CXR showed mild cardiomegaly  without acute cardiopulmonary abnormalities. Poc troponins negative x2. BP was noted to be elevated at 216/67 upon presentation. She was placed on NTG with improvement in her chest pain and blood pressure.   Past Medical History  Diagnosis Date  . Aortic stenosis     s/p tissue AVR \'05  . Dyslipidemia   . Hypertension   . Dizziness   . History of aortic stenosis      Surgical History:  Past Surgical History  Procedure Date  . Abdominal hysterectomy   . Aortic valve replacement     20 05 s/p [employer] pericardial tissue valve  . Back surgery      Home Meds: Medication Sig  aspirin EC 81 MG tablet Take 81 mg by mouth daily.  atenolol (TENORMIN) 100 MG tablet Take 100 mg by mouth daily.   cholecalciferol (VITAMIN D) 1000 UNITS tablet Take 1,000 Units by mouth daily.  diazepam (VALIUM) 5 MG tablet Take 5 mg by mouth every 8 (eight) hours as needed. For spasms  hyoscyamine (LEVSIN SL) 0.125 MG SL tablet Place 0.125 mg under the tongue every 4 (four) hours as needed. For stomach  Multiple Vitamin (MULITIVITAMIN WITH MINERALS) TABS Take 1 tablet by mouth daily.  Omega-3 Fatty Acids (FISH OIL PO) Take 1 capsule by mouth daily.   omeprazole (PRILOSEC) 20 MG capsule Take 20 mg by mouth daily.   oxybutynin (DITROPAN) 5 MG tablet Take 5 mg by mouth 3 (three) times daily.  simvastatin (ZOCOR) 20 MG tablet  Take 10 mg by mouth daily.    Allergies:  Allergen Reactions  . Amoxicillin Diarrhea  . Codeine Nausea Only  . Keflex Diarrhea  . Lisinopril Cough  . Macrobid Nausea Only  . 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 Marital Status: Widowed   Occupational History  . Retired   Social History Main Topics  . Smoking status: Never Smoker   . Alcohol Use: No  . Drug Use: No    Family history: Brother died at 49 from MI. Father and grandfather died in 74s from  MI  Review of Systems: General: negative for chills, fever, night sweats or weight changes.  Cardiovascular: As per HPI Dermatological: negative for rash Respiratory: negative for cough or wheezing Urologic: negative for hematuria Abdominal: negative for nausea, vomiting, diarrhea, bright red blood per rectum, melena, or hematemesis Neurologic: negative for visual changes, syncope, or dizziness All other systems reviewed and are otherwise negative except as noted above.  Labs:   Component Value Date   WBC 11.9* 06/06/2011   HGB 14.5 06/06/2011   HCT 43.5 06/06/2011   MCV 83.8 06/06/2011   PLT 264 06/06/2011    Lab 06/06/11 1023  NA 138  K 3.7  CL 98  CO2 30  BUN 18  CREATININE 0.59  CALCIUM 10.1  PROT 8.2  BILITOT 0.4  ALKPHOS 80  ALT 24  AST 25  GLUCOSE 156*     06/06/2011 10:25 06/06/2011 13:55  Troponin i, poc 0.00 0.02   Radiology/Studies:   06/06/2011 - CXR  Findings: There is mild cardiomegaly.  There has been a previous median sternotomy.  There are no infiltrates or edematous changes. There are mild chronic bronchitic changes.  There is no evidence for mediastinal or hilar adenopathy.  IMPRESSION: Mild cardiomegaly and mild stable chronic bronchitic changes.  No acute findings.     EKG: 06/06/11 @ 0939 - NSR 61bpm, no acute ST/T changes  Physical Exam: Blood pressure 152/70, pulse 47, temperature 98 F (36.7 C), temperature source Oral, resp. rate 18, SpO2 97.00%. General: Elderly white female in no acute distress. Head: Normocephalic, atraumatic, sclera non-icteric, nares are without discharge Neck: Supple. Negative for carotid bruits. JVD not elevated. Lungs: Clear bilaterally to auscultation without wheezes, rales, or rhonchi. Breathing is unlabored. Heart: RRR with S1 S2. No murmurs, rubs, or gallops appreciated. Abdomen: Soft, non-tender, non-distended with normoactive bowel sounds. No rebound/guarding. No obvious abdominal masses. Msk:  Strength and tone  appear normal for age. Extremities: No edema. Brace to left leg from drop foot. No clubbing or cyanosis. Distal pedal pulses are 2+ and equal bilaterally. Neuro: Alert and oriented X 3. Moves all extremities spontaneously. Psych:  Responds to questions appropriately with a normal affect.    ASSESSMENT AND PLAN:   78 y.o. female w/ PMHx significant for HTN, HLD, AS s/p bioprosthetic AVR '05, and normal coronaries by cath '05 who presented to [facility] on 06/06/2011 with complaints of chest pain.  1. Chest pain: Her chest pain is atypical with normal poc troponins and EKG without acute ischemic changes. She has cardiac risk factors of HTN, HLD, age, and family history. Will check full set of cardiac enzymes at 1700. If normal can be dc'd with plans for [facility] as an outpatient in the next day or two. If positive will admit.  2. Hypertension: BP elevated at home  and on arrival. Will cont atenolol and add Norvasc 10mg  daily with plans for f/u with PCP    Signed, HOPE, JESSICA PA-C 06/06/2011, 2:41 PM  Patient seen with PA, agree with note.  She has had almost 24 hours of left arm and left lateral chest pain.  ECG is unchanged from the past.  Cardiac enzymes are negative x 2.  Pain has actually resolved now.  BP has been very high, with SBP up to the 200s.  Baseline SBP was running in the 160s then she started a prednisone taper for "trigger finger" and SBP began to rise.  She is on atenolol at baseline but has restarted amlodipine which she has taken in the past.  She took 5 mg today and also took 5 mg yesterday.   1. Chest/arm pain: Atypical.  Prolonged pain with negative cardiac enzymes.  No ECG changes.  Had no significant coronary disease on 2005 cath before AVR.   - Repeat cardiac enzymes at 5 pm.  - If negative, may be discharged home.  Will need Steffanie Dunn tomorrow then followup with Dr. Elease Hashimoto later in the week.  2. HTN: Baseline elevated, worse with prednisone  taper.  Has been taking amlodipine 5 mg daily.  Increase amlodipine to 10 mg daily.  She has already stopped the steroids.  She will need close followup for BP evaluation.  3. Bradycardia: Mild bradycardia with HR in 50s.  Rarely dipped to upper 40s.  Suspect she needs a lower dose of atenolol or perhaps another agent given renal clearance (would consider Coreg given elevated BP).   Marca Ancona 06/06/2011 3:28 PM

## 2011-06-06 NOTE — ED Notes (Signed)
Pt now rates CP at 2/10 beneath LUE near axilla.  She now reports that she has a mild headache starting in the frontal region.

## 2011-06-06 NOTE — ED Provider Notes (Signed)
BP 145/65  Pulse 52  Temp(Src) 98 F (36.7 C) (Oral)  Resp 18  SpO2 95% Pt seen by cardiology, deemed safe for d/c home Repeat troponin negative EKG reviewed Has stress ordered for tomorrow, she was informed to hold atenolol She is comfortable with plan   Joya Gaskins, MD 06/06/11 1819

## 2011-06-06 NOTE — ED Notes (Signed)
Pt c/o midsternal CP with radiation to left arm starting last night; pt sts pain was constant all night; pt sts diaphoresis with pain; pt sts some SOB

## 2011-06-06 NOTE — Discharge Instructions (Signed)
*  PLEASE REMEMBER TO BRING ALL OF YOUR MEDICATIONS TO EACH OF YOUR FOLLOW-UP OFFICE VISITS.  * Please arrive at 7:30 tomorrow morning for your stress test. Do not eat or drink anything after midnight tonight. No caffeine (Coffee, tea, soda, chocolate, etc) until after your stress test. Do Not take your Atenolol tomorrow morning. You may take all your other meds.

## 2011-06-06 NOTE — Telephone Encounter (Signed)
New msg Pt said she having chest pain sent to triage

## 2011-06-06 NOTE — ED Provider Notes (Cosign Needed)
History     CSN: [id number]  Arrival date & time 06/06/11  4098   First MD Initiated Contact with Patient 06/06/11 [phone]      Chief Complaint  Patient presents with  . Chest Pain    (Consider location/radiation/quality/duration/timing/severity/associated sxs/prior treatment) HPI  Patient states yesterday she went to church, ate lunch, went shopping. She states yesterday afternoon she felt like something wasn't right and states she felt flushed. She took her blood pressure was over 200. She states she took her blood pressure several times and it remained high. She states about 8:30 PM she started getting a chest pain that she described as a heaviness and has been constant since it started last night. She states she was able to sleep from 4 to 6 AM however about 6 AM she awakens drenched in sweat. She states she took a baby aspirin and 2 AM and again at 6 AM. She states the pain does not radiate, she has no nausea or vomiting, she denies headache, she denies diarrhea. She states she does feel short of breath. She states this morning when she walked her dog in the ER it made the pain worse. She states she's never had this before. She states her pain is a 6/10 now and was 8/10 at its worse. Patient took an old prescription of Norvasc 5 mg daily for her hypertension.  Patient also describes a 40 pound weight loss in the past year due to the illness and death of her husband about Thanksgiving.  PCP Lupita Raider Cardiologist Dr. Elease Hashimoto she last saw him in December Urologist Dr. Annabell Howells Neurosurgeon Dr. Venetia Maxon  Past Medical History  Diagnosis Date  . Aortic stenosis     s/p tissue AVR '05  . Dyslipidemia   . Hypertension   . Dizziness   . History of aortic stenosis     Past Surgical History  Procedure Date  . Abdominal hysterectomy   . Aortic valve replacement     2005 s/p [facility] pericardial tissue valve  . Back surgery     History reviewed. No pertinent family  history. FOP died at age 59 from CAD Brother died at age 67 from CAD   History  Substance Use Topics  . Smoking status: Never Smoker   . Smokeless tobacco: Not on file  . Alcohol Use: No   patient lives alone Patient wears a brace on her left foot because of foot drop from prior back surgery  OB History    Grav Para Term Preterm Abortions TAB SAB Ect Mult Living                  Review of Systems  All other systems reviewed and are negative.    Allergies  Amoxicillin; Codeine; Keflex; Lisinopril; Macrobid; and Morphine and related  Home Medications   Current Outpatient Rx  Name Route Sig Dispense Refill  . ASPIRIN EC 81 MG PO TBEC Oral Take 81 mg by mouth daily.    . ATENOLOL 100 MG PO TABS Oral Take 100 mg by mouth daily.     Marland Kitchen VITAMIN D 1000 UNITS PO TABS Oral Take 1,000 Units by mouth daily.    Marland Kitchen DIAZEPAM 5 MG PO TABS Oral Take 5 mg by mouth every 8 (eight) hours as needed. For spasms    . HYOSCYAMINE SULFATE 0.125 MG SL SUBL Sublingual Place 0.125 mg under the tongue every 4 (four) hours as needed. For stomach    . ADULT MULTIVITAMIN W/MINERALS  CH Oral Take 1 tablet by mouth daily.    Marland Kitchen FISH OIL PO Oral Take 1 capsule by mouth daily.     Marland Kitchen OMEPRAZOLE 20 MG PO CPDR Oral Take 20 mg by mouth daily.     . OXYBUTYNIN CHLORIDE 5 MG PO TABS Oral Take 5 mg by mouth 3 (three) times daily.    Marland Kitchen SIMVASTATIN 20 MG PO TABS Oral Take 10 mg by mouth daily.       BP 217/71  Pulse 59  Temp(Src) 98 F (36.7 C) (Oral)  Resp 14  SpO2 98%  Vital signs normal except for hypertension and bradycardia   Physical Exam  Nursing note and vitals reviewed. Constitutional: She is oriented to person, place, and time. She appears well-developed and well-nourished.  Non-toxic appearance. She does not appear ill. No distress.  HENT:  Head: Normocephalic and atraumatic.  Right Ear: External ear normal.  Left Ear: External ear normal.  Nose: Nose normal. No mucosal edema or rhinorrhea.    Mouth/Throat: Oropharynx is clear and moist and mucous membranes are normal. No dental abscesses or uvula swelling.  Eyes: Conjunctivae and EOM are normal. Pupils are equal, round, and reactive to light.  Neck: Normal range of motion and full passive range of motion without pain. Neck supple.  Cardiovascular: Normal rate, regular rhythm and normal heart sounds.  Exam reveals no gallop and no friction rub.   No murmur heard. Pulmonary/Chest: Effort normal and breath sounds normal. No respiratory distress. She has no wheezes. She has no rhonchi. She has no rales. She exhibits no tenderness and no crepitus.  Abdominal: Soft. Normal appearance and bowel sounds are normal. She exhibits no distension. There is no tenderness. There is no rebound and no guarding.  Musculoskeletal: Normal range of motion. She exhibits no edema and no tenderness.       Moves all extremities well. Patient has a brace on her right lower leg because of foot drop  Neurological: She is alert and oriented to person, place, and time. She has normal strength. No cranial nerve deficit.  Skin: Skin is warm, dry and intact. No rash noted. No erythema. No pallor.  Psychiatric: She has a normal mood and affect. Her speech is normal and behavior is normal. Her mood appears not anxious.    ED Course  Procedures (including critical care time)   Medications  0.9 %  sodium chloride infusion (  Intravenous New Bag/Given 06/06/11 1012)  ondansetron (ZOFRAN) injection 4 mg (not administered)  nitroGLYCERIN 0.2 mg/mL in dextrose 5 % infusion (5 mcg/min Intravenous New Bag/Given 06/06/11 1034)  aspirin chewable tablet 324 mg (324 mg Oral Given 06/06/11 1037)   11:40 pt on NTG drip, BP now 122/63, states her pain is a "one". Will stop her NTG and observe her BP. Hopefully the norvasc she took this morning is now working. Will get a second troponin and if negative can go home.   NTG drip stopped and BP rose to the 150 range, have discussed  with nurse if gets to 170 will give clonidine.   14:02 Rosann Auerbach will have her evaluated for her chest pain and HTN.   14:30 BP 137/72 off NTG and without further treatment.   Per [facility] Cardiology note, they want a 5 pm troponin, if negative can go home and return in am to their office for a stress test. They also want her to start taking norvasc 10 mg a day   Results for orders placed during the hospital  encounter of 06/06/11  CBC      Component Value Range   WBC 11.9 (*) 4.0 - 10.5 (K/uL)   RBC 5.19 (*) 3.87 - 5.11 (MIL/uL)   Hemoglobin 14.5  12.0 - 15.0 (g/dL)   HCT 14.7  82.9 - 56.2 (%)   MCV 83.8  78.0 - 100.0 (fL)   MCH 27.9  26.0 - 34.0 (pg)   MCHC 33.3  30.0 - 36.0 (g/dL)   RDW 13.0  86.5 - 78.4 (%)   Platelets 264  150 - 400 (K/uL)  DIFFERENTIAL      Component Value Range   Neutrophils Relative 49  43 - 77 (%)   Neutro Abs 5.8  1.7 - 7.7 (K/uL)   Lymphocytes Relative 39  12 - 46 (%)   Lymphs Abs 4.6 (*) 0.7 - 4.0 (K/uL)   Monocytes Relative 10  3 - 12 (%)   Monocytes Absolute 1.2 (*) 0.1 - 1.0 (K/uL)   Eosinophils Relative 2  0 - 5 (%)   Eosinophils Absolute 0.2  0.0 - 0.7 (K/uL)   Basophils Relative 1  0 - 1 (%)   Basophils Absolute 0.1  0.0 - 0.1 (K/uL)  COMPREHENSIVE METABOLIC PANEL      Component Value Range   Sodium 138  135 - 145 (mEq/L)   Potassium 3.7  3.5 - 5.1 (mEq/L)   Chloride 98  96 - 112 (mEq/L)   CO2 30  19 - 32 (mEq/L)   Glucose, Bld 156 (*) 70 - 99 (mg/dL)   BUN 18  6 - 23 (mg/dL)   Creatinine, Ser 6.96  0.50 - 1.10 (mg/dL)   Calcium 29.5  8.4 - 10.5 (mg/dL)   Total Protein 8.2  6.0 - 8.3 (g/dL)   Albumin 3.9  3.5 - 5.2 (g/dL)   AST 25  0 - 37 (U/L)   ALT 24  0 - 35 (U/L)   Alkaline Phosphatase 80  39 - 117 (U/L)   Total Bilirubin 0.4  0.3 - 1.2 (mg/dL)   GFR calc non Af Amer 83 (*) >90 (mL/min)   GFR calc Af Amer >90  >90 (mL/min)  APTT      Component Value Range   aPTT 27  24 - 37 (seconds)  PROTIME-INR      Component Value Range    Prothrombin Time 14.4  11.6 - 15.2 (seconds)   INR 1.10  0.00 - 1.49   POCT I-STAT TROPONIN I      Component Value Range   Troponin i, poc 0.00  0.00 - 0.08 (ng/mL)   Comment 3           POCT I-STAT TROPONIN I      Component Value Range   Troponin i, poc 0.02  0.00 - 0.08 (ng/mL)   Comment 3            Laboratory interpretation all normal except leukocytosis    Dg Chest Port 1 View  06/06/2011  *RADIOLOGY REPORT*  Clinical Data: Epigastric pain.  Hypertension.  Shortness of breath.  PORTABLE CHEST - 1 VIEW  Comparison: 07/02/2009.  Findings: There is mild cardiomegaly.  There has been a previous median sternotomy.  There are no infiltrates or edematous changes. There are mild chronic bronchitic changes.  There is no evidence for mediastinal or hilar adenopathy.  IMPRESSION: Mild cardiomegaly and mild stable chronic bronchitic changes.  No acute findings.  Original Report Authenticated By: Rolla Plate, M.D.      Date: 06/06/2011  Rate: 61  Rhythm: normal sinus rhythm  QRS Axis: left  Intervals: normal  ST/T Wave abnormalities: normal  Conduction Disutrbances:none  Narrative Interpretation: Q waves septally  Old EKG Reviewed: unchanged from 12/13/2007     1. Chest pain     Devoria Albe, MD, FACEP   MDM          Ward Givens, MD 06/06/11 [phone]

## 2011-06-07 ENCOUNTER — Ambulatory Visit (HOSPITAL_COMMUNITY): Payer: Medicare Other | Attending: Cardiovascular Disease | Admitting: Radiology

## 2011-06-07 VITALS — BP 147/73 | Ht 61.0 in | Wt 137.0 lb

## 2011-06-07 DIAGNOSIS — I4949 Other premature depolarization: Secondary | ICD-10-CM | POA: Diagnosis not present

## 2011-06-07 DIAGNOSIS — I10 Essential (primary) hypertension: Secondary | ICD-10-CM | POA: Insufficient documentation

## 2011-06-07 DIAGNOSIS — R000 Tachycardia, unspecified: Secondary | ICD-10-CM | POA: Diagnosis not present

## 2011-06-07 DIAGNOSIS — R079 Chest pain, unspecified: Secondary | ICD-10-CM | POA: Diagnosis not present

## 2011-06-07 DIAGNOSIS — R0989 Other specified symptoms and signs involving the circulatory and respiratory systems: Secondary | ICD-10-CM | POA: Insufficient documentation

## 2011-06-07 DIAGNOSIS — E785 Hyperlipidemia, unspecified: Secondary | ICD-10-CM | POA: Diagnosis not present

## 2011-06-07 DIAGNOSIS — Z8249 Family history of ischemic heart disease and other diseases of the circulatory system: Secondary | ICD-10-CM | POA: Diagnosis not present

## 2011-06-07 DIAGNOSIS — R0609 Other forms of dyspnea: Secondary | ICD-10-CM | POA: Diagnosis not present

## 2011-06-07 MED ORDER — TECHNETIUM TC 99M TETROFOSMIN IV KIT
10.0000 | PACK | Freq: Once | INTRAVENOUS | Status: AC | PRN
  Administered 2011-06-07: 10 via INTRAVENOUS

## 2011-06-07 MED ORDER — REGADENOSON 0.4 MG/5ML IV SOLN
0.4000 mg | Freq: Once | INTRAVENOUS | Status: AC
  Administered 2011-06-07: 0.4 mg via INTRAVENOUS

## 2011-06-07 MED ORDER — TECHNETIUM TC 99M TETROFOSMIN IV KIT
30.0000 | PACK | Freq: Once | INTRAVENOUS | Status: AC | PRN
  Administered 2011-06-07: 30 via INTRAVENOUS

## 2011-06-07 NOTE — Progress Notes (Signed)
[facility] SITE 3 NUCLEAR MED [address] [place] [address] [phone]  Cardiology Nuclear Med Study  Brittany [name] is a 78 y.o. female [id number] [birth year]   Nuclear Med Background Indication for Stress Test:  Evaluation for Ischemia, and Patient seen in hospital on 06/06/11 for CP, Enzymes negative History: '05 Echo--MPS: [facility] (-) per pt--Heart Cath-Severe AS NL Coronaries Cardiac Risk Factors: Family History - CAD, Hypertension and Lipids  Symptoms: Chest pain,DOE, Rapid HR, chest pressure this am   Nuclear Pre-Procedure Caffeine/Decaff Intake:  None> 12 hrs NPO After: 8:30pm   Lungs: clear IV 0.9% NS with Angio Cath:  22g  IV Site: R Wrist, tolerated well IV Started by:  Irean Hong, RN  Chest Size (in):  40 Cup Size: B  Height: 5\' 1"  (1.549 m)  Weight:  137 lb (62.143 kg)  BMI:  Body mass index is 25.89 kg/(m^2). Tech Comments:  n/a    Nuclear Med Study 1 or 2 day study: 1 day  Stress Test Type:  Lexiscan  Reading MD: Charlton Haws, MD  Order Authorizing Provider:  Kristeen Miss, MD  Resting Radionuclide: Technetium 89m Tetrofosmin  Resting Radionuclide Dose: 11.0 mCi   Stress Radionuclide:  Technetium 46m Tetrofosmin  Stress Radionuclide Dose: 33.0 mCi           Stress Protocol Rest HR: 61 Stress HR: 76  Rest BP: 147/73 Stress BP: 128/74  Exercise Time (min): n/a METS: n/a   Predicted Max HR: 138 bpm % Max HR: 55.07 bpm Rate Pressure Product: 52841   Dose of Adenosine (mg):  n/a Dose of Lexiscan: 0.4 mg  Dose of Atropine (mg): n/a Dose of Dobutamine: n/a mcg/kg/min (at max HR)  Stress Test Technologist: Milana Na, EMT-P  Nuclear Technologist:  Domenic Polite, CNMT     Rest Procedure:  Myocardial perfusion imaging was performed at rest 45 minutes following the intravenous administration of Technetium 41m Tetrofosmin. Rest ECG: NSR  Stress Procedure:  The patient received IV Lexiscan 0.4 mg over 15-seconds.  Technetium  69m Tetrofosmin injected at 30-seconds.  There were non specific, sob, chest pressure, nausea, and rare pacs changes with Lexiscan.  Quantitative spect images were obtained after a 45 minute delay. Stress ECG: No significant change from baseline ECG  QPS Raw Data Images:  Normal; no motion artifact; normal heart/lung ratio. Stress Images:  Normal homogeneous uptake in all areas of the myocardium. Rest Images:  Normal homogeneous uptake in all areas of the myocardium. Subtraction (SDS):  Normal Transient Ischemic Dilatation (Normal <1.22):  0.98 Lung/Heart Ratio (Normal <0.45):  0.27  Quantitative Gated Spect Images QGS EDV:  65 ml QGS ESV:  13 ml QGS cine images:  NL LV Function; NL Wall Motion QGS EF: 80%  Impression Exercise Capacity:  Lexiscan with low level exercise. BP Response:  Normal blood pressure response. Clinical Symptoms:  Mild chest pain/dyspnea. ECG Impression:  No significant ST segment change suggestive of ischemia. Comparison with Prior Nuclear Study: No images to compare  Overall Impression:  Normal stress nuclear study.   Charlton Haws

## 2011-06-09 ENCOUNTER — Ambulatory Visit: Payer: Medicare Other | Admitting: Physician Assistant

## 2011-06-09 DIAGNOSIS — N39498 Other specified urinary incontinence: Secondary | ICD-10-CM | POA: Diagnosis not present

## 2011-06-09 DIAGNOSIS — N3010 Interstitial cystitis (chronic) without hematuria: Secondary | ICD-10-CM | POA: Diagnosis not present

## 2011-06-09 DIAGNOSIS — N3941 Urge incontinence: Secondary | ICD-10-CM | POA: Diagnosis not present

## 2011-06-15 ENCOUNTER — Ambulatory Visit (INDEPENDENT_AMBULATORY_CARE_PROVIDER_SITE_OTHER): Payer: Medicare Other | Admitting: Physician Assistant

## 2011-06-15 ENCOUNTER — Encounter: Payer: Self-pay | Admitting: Physician Assistant

## 2011-06-15 DIAGNOSIS — R079 Chest pain, unspecified: Secondary | ICD-10-CM

## 2011-06-15 DIAGNOSIS — I10 Essential (primary) hypertension: Secondary | ICD-10-CM

## 2011-06-15 DIAGNOSIS — I350 Nonrheumatic aortic (valve) stenosis: Secondary | ICD-10-CM

## 2011-06-15 DIAGNOSIS — I359 Nonrheumatic aortic valve disorder, unspecified: Secondary | ICD-10-CM | POA: Diagnosis not present

## 2011-06-15 MED ORDER — ATENOLOL 100 MG PO TABS
50.0000 mg | ORAL_TABLET | Freq: Every day | ORAL | Status: DC
Start: 2011-06-15 — End: 2011-11-18

## 2011-06-15 NOTE — Assessment & Plan Note (Signed)
Patient had an admission with chest pain and ruled out for an MI. Stress Myoview on 06/07/11 was normal. Chest pain was felt to be noncardiac.

## 2011-06-15 NOTE — Assessment & Plan Note (Signed)
Blood pressure under better control. We will decrease her atenolol because of bradycardia in the 40s in the hospital. I have asked her to follow a 2 g sodium diet. She will continue to monitor her blood pressures at home.

## 2011-06-15 NOTE — Assessment & Plan Note (Signed)
Status post tissue aortic valve replacement and 2005 for severe AS, stable

## 2011-06-15 NOTE — Patient Instructions (Signed)
Your physician recommends that you schedule a follow-up appointment in: 2-3 months with Dr. Elease Hashimoto.  Decrease your Atenolol to 50mg  daily.  (Take half of your 100mg  tabs)  2 Gram Low Sodium Diet A 2 gram sodium diet restricts the amount of sodium in the diet to no more than 2 g or 2000 mg daily. Limiting the amount of sodium is often used to help lower blood pressure. It is important if you have heart, liver, or kidney problems. Many foods contain sodium for flavor and sometimes as a preservative. When the amount of sodium in a diet needs to be low, it is important to know what to look for when choosing foods and drinks. The following includes some information and guidelines to help make it easier for you to adapt to a low sodium diet. QUICK TIPS  Do not add salt to food.   Avoid convenience items and fast food.   Choose unsalted snack foods.   Buy lower sodium products, often labeled as "lower sodium" or "no salt added."   Check food labels to learn how much sodium is in 1 serving.   When eating at a restaurant, ask that your food be prepared with less salt or none, if possible.  READING FOOD LABELS FOR SODIUM INFORMATION The nutrition facts label is a good place to find how much sodium is in foods. Look for products with no more than 500 to 600 mg of sodium per meal and no more than 150 mg per serving. Remember that 2 g = 2000 mg. The food label may also list foods as:  Sodium-free: Less than 5 mg in a serving.   Very low sodium: 35 mg or less in a serving.   Low-sodium: 140 mg or less in a serving.   Light in sodium: 50% less sodium in a serving. For example, if a food that usually has 300 mg of sodium is changed to become light in sodium, it will have 150 mg of sodium.   Reduced sodium: 25% less sodium in a serving. For example, if a food that usually has 400 mg of sodium is changed to reduced sodium, it will have 300 mg of sodium.  CHOOSING FOODS Grains  Avoid: Salted  crackers and snack items. Some cereals, including instant hot cereals. Bread stuffing and biscuit mixes. Seasoned rice or pasta mixes.   Choose: Unsalted snack items. Low-sodium cereals, oats, puffed wheat and rice, shredded wheat. English muffins and bread. Pasta.  Meats  Avoid: Salted, canned, smoked, spiced, pickled meats, including fish and poultry. Bacon, ham, sausage, cold cuts, hot dogs, anchovies.   Choose: Low-sodium canned tuna and salmon. Fresh or frozen meat, poultry, and fish.  Dairy  Avoid: Processed cheese and spreads. Cottage cheese. Buttermilk and condensed milk. Regular cheese.   Choose: Milk. Low-sodium cottage cheese. Yogurt. Sour cream. Low-sodium cheese.  Fruits and Vegetables  Avoid: Regular canned vegetables. Regular canned tomato sauce and paste. Frozen vegetables in sauces. Olives. Rosita Fire. Relishes. Sauerkraut.   Choose: Low-sodium canned vegetables. Low-sodium tomato sauce and paste. Frozen or fresh vegetables. Fresh and frozen fruit.  Condiments  Avoid: Canned and packaged gravies. Worcestershire sauce. Tartar sauce. Barbecue sauce. Soy sauce. Steak sauce. Ketchup. Onion, garlic, and table salt. Meat flavorings and tenderizers.   Choose: Fresh and dried herbs and spices. Low-sodium varieties of mustard and ketchup. Lemon juice. Tabasco sauce. Horseradish.  SAMPLE 2 GRAM SODIUM MEAL PLAN Breakfast / Sodium (mg)  1 cup low-fat milk / 143 mg   2  slices whole-wheat toast / 270 mg   1 tbs heart-healthy margarine / 153 mg   1 hard-boiled egg / 139 mg   1 small orange / 0 mg  Lunch / Sodium (mg)  1 cup raw carrots / 76 mg    cup hummus / 298 mg   1 cup low-fat milk / 143 mg    cup red grapes / 2 mg   1 whole-wheat pita bread / 356 mg  Dinner / Sodium (mg)  1 cup whole-wheat pasta / 2 mg   1 cup low-sodium tomato sauce / 73 mg   3 oz lean ground beef / 57 mg   1 small side salad (1 cup raw spinach leaves,  cup cucumber,  cup yellow bell  pepper) with 1 tsp olive oil and 1 tsp red wine vinegar / 25 mg  Snack / Sodium (mg)  1 container low-fat vanilla yogurt / 107 mg   3 graham cracker squares / 127 mg  Nutrient Analysis  Calories: 2033   Protein: 77 g   Carbohydrate: 282 g   Fat: 72 g   Sodium: 1971 mg  Document Released: 03/28/2005 Document Revised: 03/17/2011 Document Reviewed: 06/29/2009 [facility] Patient Information 2012 [place], [place].

## 2011-06-15 NOTE — Progress Notes (Signed)
HPI:  This is a 78 year old white female patient who presented the emergency room with prolonged chest pain and hypertension after stopping prednisone suddenly that was prescribed for trigger finger and arthritis. She ruled out for an MI and she was restarted on her Norvasc that had been stopped because she had lost 40 pounds over the past year. She also was bradycardic and Dr. Shirlee Latch suggested decreasing her atenolol or switching to Coreg. The patient had a nuclear stress test on 06/07/11 that was normal.  The patient done quite well since he's been home. He is kept track of her blood pressures and they have been stable. She denies any further chest pain, palpitations, dyspnea, dyspnea on exertion, dizziness, or presyncope.  Allergies  Allergen Reactions  . Amoxicillin Diarrhea  . Codeine Nausea Only  . Keflex Diarrhea  . Lisinopril Cough  . Macrobid Nausea Only  . 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 Prednisone     Current Outpatient Prescriptions on File Prior to Visit  Medication Sig Dispense Refill  . amLODipine (NORVASC) 10 MG tablet Take 1 tablet (10 mg total) by mouth daily.  30 tablet  3  . aspirin EC 81 MG tablet Take 81 mg by mouth daily.      . cholecalciferol (VITAMIN D) 1000 UNITS tablet Take 1,000 Units by mouth daily.      . diazepam (VALIUM) 5 MG tablet Take 5 mg by mouth every 8 (eight) hours as needed. For spasms      . Multiple Vitamin (MULITIVITAMIN WITH MINERALS) TABS Take 1 tablet by mouth daily.      . Omega-3 Fatty Acids (FISH OIL PO) Take 1 capsule by mouth daily.       Marland Kitchen omeprazole (PRILOSEC) 20 MG capsule Take 20 mg by mouth daily.       Marland Kitchen oxybutynin (DITROPAN) 5 MG tablet Take 5 mg by mouth 3 (three) times daily.      . simvastatin (ZOCOR) 20 MG tablet Take 10 mg by mouth daily.       . hyoscyamine (LEVSIN SL) 0.125 MG SL tablet Place 0.125 mg under the tongue every 4 (four) hours as  needed. For stomach        Past Medical History  Diagnosis Date  . Aortic stenosis     s/p tissue AVR '05  . Dyslipidemia   . Hypertension   . Dizziness   . History of aortic stenosis     Past Surgical History  Procedure Date  . Abdominal hysterectomy   . Aortic valve replacement     2005 s/p [facility] pericardial tissue valve  . Back surgery     No family history on file.  History   Social History  . Marital Status: Married    Spouse Name: N/A    Number of Children: N/A  . Years of Education: N/A   Occupational History  . Not on file.   Social History Main Topics  . Smoking status: Never Smoker   . Smokeless tobacco: Not on file  . Alcohol Use: No  . Drug Use: No  . Sexually Active: Not on file   Other Topics Concern  . Not on file   Social History Narrative  . No narrative on file    ROS:see history of present illness otherwise negative   PHYSICAL EXAM: Well-nournished, in no acute distress. Neck: No JVD, HJR, Bruit, or thyroid enlargement  Lungs: No tachypnea, clear without wheezing, rales, or rhonchi Cardiovascular: RRR, PMI not displaced, heart sounds normal, no murmurs, gallops, bruit, thrill, or heave. Abdomen: BS normal. Soft without organomegaly, masses, lesions or tenderness. Extremities: without cyanosis, clubbing or edema. Good distal pulses bilateral SKin: Warm, no lesions or rashes  Musculoskeletal: No deformities Neuro: no focal signs  BP 130/70  Pulse 68  Resp 18  Ht 5\' 3"  (1.6 m)  Wt 144 lb (65.318 kg)  BMI 25.51 kg/m2

## 2011-06-17 DIAGNOSIS — H9190 Unspecified hearing loss, unspecified ear: Secondary | ICD-10-CM | POA: Diagnosis not present

## 2011-06-17 DIAGNOSIS — H6120 Impacted cerumen, unspecified ear: Secondary | ICD-10-CM | POA: Diagnosis not present

## 2011-06-17 DIAGNOSIS — E782 Mixed hyperlipidemia: Secondary | ICD-10-CM | POA: Diagnosis not present

## 2011-07-21 ENCOUNTER — Other Ambulatory Visit: Payer: Self-pay | Admitting: Family Medicine

## 2011-07-21 DIAGNOSIS — R42 Dizziness and giddiness: Secondary | ICD-10-CM

## 2011-07-21 DIAGNOSIS — I10 Essential (primary) hypertension: Secondary | ICD-10-CM | POA: Diagnosis not present

## 2011-07-23 ENCOUNTER — Ambulatory Visit
Admission: RE | Admit: 2011-07-23 | Discharge: 2011-07-23 | Disposition: A | Discharge status: Home / Self Care | Payer: Medicare Other | Source: Ambulatory Visit | Attending: Family Medicine | Admitting: Family Medicine

## 2011-07-23 DIAGNOSIS — R42 Dizziness and giddiness: Secondary | ICD-10-CM

## 2011-07-23 DIAGNOSIS — G319 Degenerative disease of nervous system, unspecified: Secondary | ICD-10-CM | POA: Diagnosis not present

## 2011-07-23 DIAGNOSIS — I6350 Cerebral infarction due to unspecified occlusion or stenosis of unspecified cerebral artery: Secondary | ICD-10-CM | POA: Diagnosis not present

## 2011-07-25 DIAGNOSIS — M5126 Other intervertebral disc displacement, lumbar region: Secondary | ICD-10-CM | POA: Diagnosis not present

## 2011-07-26 ENCOUNTER — Telehealth: Payer: Self-pay | Admitting: Physician Assistant

## 2011-07-26 NOTE — Telephone Encounter (Signed)
Faxed LOV and EKG to [facility].07/26/11 emg

## 2011-07-27 DIAGNOSIS — I6330 Cerebral infarction due to thrombosis of unspecified cerebral artery: Secondary | ICD-10-CM | POA: Diagnosis not present

## 2011-07-27 DIAGNOSIS — I10 Essential (primary) hypertension: Secondary | ICD-10-CM | POA: Diagnosis not present

## 2011-07-27 DIAGNOSIS — R269 Unspecified abnormalities of gait and mobility: Secondary | ICD-10-CM | POA: Diagnosis not present

## 2011-07-27 DIAGNOSIS — H5310 Unspecified subjective visual disturbances: Secondary | ICD-10-CM | POA: Diagnosis not present

## 2011-07-28 DIAGNOSIS — M5126 Other intervertebral disc displacement, lumbar region: Secondary | ICD-10-CM | POA: Diagnosis not present

## 2011-07-28 DIAGNOSIS — M79609 Pain in unspecified limb: Secondary | ICD-10-CM | POA: Diagnosis not present

## 2011-07-28 DIAGNOSIS — M47817 Spondylosis without myelopathy or radiculopathy, lumbosacral region: Secondary | ICD-10-CM | POA: Diagnosis not present

## 2011-08-01 DIAGNOSIS — H40019 Open angle with borderline findings, low risk, unspecified eye: Secondary | ICD-10-CM | POA: Diagnosis not present

## 2011-08-01 DIAGNOSIS — Z961 Presence of intraocular lens: Secondary | ICD-10-CM | POA: Diagnosis not present

## 2011-08-01 DIAGNOSIS — H5347 Heteronymous bilateral field defects: Secondary | ICD-10-CM | POA: Diagnosis not present

## 2011-08-01 DIAGNOSIS — H43399 Other vitreous opacities, unspecified eye: Secondary | ICD-10-CM | POA: Diagnosis not present

## 2011-08-03 DIAGNOSIS — I6350 Cerebral infarction due to unspecified occlusion or stenosis of unspecified cerebral artery: Secondary | ICD-10-CM | POA: Diagnosis not present

## 2011-08-09 ENCOUNTER — Other Ambulatory Visit (HOSPITAL_COMMUNITY): Payer: Self-pay | Admitting: Radiology

## 2011-08-09 DIAGNOSIS — I639 Cerebral infarction, unspecified: Secondary | ICD-10-CM

## 2011-08-10 ENCOUNTER — Other Ambulatory Visit: Payer: Self-pay

## 2011-08-10 ENCOUNTER — Ambulatory Visit (HOSPITAL_COMMUNITY): Payer: Medicare Other | Attending: Neurology

## 2011-08-10 DIAGNOSIS — E785 Hyperlipidemia, unspecified: Secondary | ICD-10-CM | POA: Insufficient documentation

## 2011-08-10 DIAGNOSIS — I079 Rheumatic tricuspid valve disease, unspecified: Secondary | ICD-10-CM | POA: Diagnosis not present

## 2011-08-10 DIAGNOSIS — I379 Nonrheumatic pulmonary valve disorder, unspecified: Secondary | ICD-10-CM | POA: Insufficient documentation

## 2011-08-10 DIAGNOSIS — I359 Nonrheumatic aortic valve disorder, unspecified: Secondary | ICD-10-CM | POA: Diagnosis not present

## 2011-08-10 DIAGNOSIS — R42 Dizziness and giddiness: Secondary | ICD-10-CM | POA: Diagnosis not present

## 2011-08-10 DIAGNOSIS — I080 Rheumatic disorders of both mitral and aortic valves: Secondary | ICD-10-CM | POA: Insufficient documentation

## 2011-08-10 DIAGNOSIS — I639 Cerebral infarction, unspecified: Secondary | ICD-10-CM

## 2011-08-10 HISTORY — PX: TRANSTHORACIC ECHOCARDIOGRAM: SHX275

## 2011-08-11 ENCOUNTER — Encounter (HOSPITAL_COMMUNITY): Payer: Self-pay | Admitting: Neurology

## 2011-08-17 ENCOUNTER — Ambulatory Visit: Payer: Medicare Other | Attending: Neurology | Admitting: Physical Therapy

## 2011-08-17 DIAGNOSIS — IMO0001 Reserved for inherently not codable concepts without codable children: Secondary | ICD-10-CM | POA: Diagnosis not present

## 2011-08-17 DIAGNOSIS — M6281 Muscle weakness (generalized): Secondary | ICD-10-CM | POA: Insufficient documentation

## 2011-08-17 DIAGNOSIS — R269 Unspecified abnormalities of gait and mobility: Secondary | ICD-10-CM | POA: Insufficient documentation

## 2011-08-23 ENCOUNTER — Ambulatory Visit: Payer: Medicare Other | Admitting: Physical Therapy

## 2011-08-23 DIAGNOSIS — N3010 Interstitial cystitis (chronic) without hematuria: Secondary | ICD-10-CM | POA: Diagnosis not present

## 2011-08-23 DIAGNOSIS — R82998 Other abnormal findings in urine: Secondary | ICD-10-CM | POA: Diagnosis not present

## 2011-08-24 DIAGNOSIS — K219 Gastro-esophageal reflux disease without esophagitis: Secondary | ICD-10-CM | POA: Diagnosis not present

## 2011-08-24 DIAGNOSIS — K5900 Constipation, unspecified: Secondary | ICD-10-CM | POA: Diagnosis not present

## 2011-08-25 ENCOUNTER — Ambulatory Visit: Payer: Medicare Other | Admitting: Physical Therapy

## 2011-08-30 ENCOUNTER — Ambulatory Visit: Payer: Medicare Other | Admitting: Physical Therapy

## 2011-08-30 DIAGNOSIS — M6281 Muscle weakness (generalized): Secondary | ICD-10-CM | POA: Diagnosis not present

## 2011-08-30 DIAGNOSIS — IMO0001 Reserved for inherently not codable concepts without codable children: Secondary | ICD-10-CM | POA: Diagnosis not present

## 2011-08-30 DIAGNOSIS — R269 Unspecified abnormalities of gait and mobility: Secondary | ICD-10-CM | POA: Diagnosis not present

## 2011-09-01 ENCOUNTER — Ambulatory Visit: Payer: Medicare Other | Admitting: Physical Therapy

## 2011-09-01 DIAGNOSIS — M6281 Muscle weakness (generalized): Secondary | ICD-10-CM | POA: Diagnosis not present

## 2011-09-01 DIAGNOSIS — IMO0001 Reserved for inherently not codable concepts without codable children: Secondary | ICD-10-CM | POA: Diagnosis not present

## 2011-09-01 DIAGNOSIS — R269 Unspecified abnormalities of gait and mobility: Secondary | ICD-10-CM | POA: Diagnosis not present

## 2011-09-06 ENCOUNTER — Ambulatory Visit: Payer: Medicare Other | Admitting: Physical Therapy

## 2011-09-06 DIAGNOSIS — IMO0001 Reserved for inherently not codable concepts without codable children: Secondary | ICD-10-CM | POA: Diagnosis not present

## 2011-09-06 DIAGNOSIS — M6281 Muscle weakness (generalized): Secondary | ICD-10-CM | POA: Diagnosis not present

## 2011-09-06 DIAGNOSIS — R269 Unspecified abnormalities of gait and mobility: Secondary | ICD-10-CM | POA: Diagnosis not present

## 2011-09-07 DIAGNOSIS — H5310 Unspecified subjective visual disturbances: Secondary | ICD-10-CM | POA: Diagnosis not present

## 2011-09-07 DIAGNOSIS — I10 Essential (primary) hypertension: Secondary | ICD-10-CM | POA: Diagnosis not present

## 2011-09-07 DIAGNOSIS — I6330 Cerebral infarction due to thrombosis of unspecified cerebral artery: Secondary | ICD-10-CM | POA: Diagnosis not present

## 2011-09-07 DIAGNOSIS — R269 Unspecified abnormalities of gait and mobility: Secondary | ICD-10-CM | POA: Diagnosis not present

## 2011-09-08 ENCOUNTER — Ambulatory Visit: Payer: Medicare Other | Admitting: Physical Therapy

## 2011-09-13 ENCOUNTER — Ambulatory Visit: Payer: Medicare Other | Attending: Neurology | Admitting: Physical Therapy

## 2011-09-13 DIAGNOSIS — IMO0001 Reserved for inherently not codable concepts without codable children: Secondary | ICD-10-CM | POA: Diagnosis not present

## 2011-09-13 DIAGNOSIS — M6281 Muscle weakness (generalized): Secondary | ICD-10-CM | POA: Diagnosis not present

## 2011-09-13 DIAGNOSIS — R269 Unspecified abnormalities of gait and mobility: Secondary | ICD-10-CM | POA: Diagnosis not present

## 2011-09-15 ENCOUNTER — Ambulatory Visit: Payer: Medicare Other | Admitting: Physical Therapy

## 2011-09-20 ENCOUNTER — Ambulatory Visit: Payer: Medicare Other | Admitting: Physical Therapy

## 2011-09-22 ENCOUNTER — Other Ambulatory Visit: Payer: Self-pay | Admitting: Neurosurgery

## 2011-09-22 ENCOUNTER — Ambulatory Visit: Payer: Medicare Other | Admitting: Physical Therapy

## 2011-09-22 DIAGNOSIS — M549 Dorsalgia, unspecified: Secondary | ICD-10-CM

## 2011-09-23 ENCOUNTER — Ambulatory Visit
Admission: RE | Admit: 2011-09-23 | Discharge: 2011-09-23 | Disposition: A | Discharge status: Home / Self Care | Payer: Medicare Other | Source: Ambulatory Visit | Attending: Neurosurgery | Admitting: Neurosurgery

## 2011-09-23 DIAGNOSIS — M5126 Other intervertebral disc displacement, lumbar region: Secondary | ICD-10-CM | POA: Diagnosis not present

## 2011-09-23 DIAGNOSIS — M5137 Other intervertebral disc degeneration, lumbosacral region: Secondary | ICD-10-CM | POA: Diagnosis not present

## 2011-09-23 DIAGNOSIS — M47817 Spondylosis without myelopathy or radiculopathy, lumbosacral region: Secondary | ICD-10-CM | POA: Diagnosis not present

## 2011-09-23 DIAGNOSIS — M549 Dorsalgia, unspecified: Secondary | ICD-10-CM

## 2011-09-23 MED ORDER — GADOBENATE DIMEGLUMINE 529 MG/ML IV SOLN
13.0000 mL | Freq: Once | INTRAVENOUS | Status: AC | PRN
  Administered 2011-09-23: 13 mL via INTRAVENOUS

## 2011-09-23 MED ORDER — GADOBENATE DIMEGLUMINE 529 MG/ML IV SOLN: 14.0000 mL | Freq: Once | INTRAVENOUS | Status: DC | PRN

## 2011-09-27 ENCOUNTER — Ambulatory Visit: Payer: Medicare Other | Admitting: Physical Therapy

## 2011-09-29 ENCOUNTER — Ambulatory Visit: Payer: Medicare Other | Admitting: Physical Therapy

## 2011-09-29 ENCOUNTER — Encounter: Payer: Self-pay | Admitting: Cardiovascular Disease

## 2011-10-03 ENCOUNTER — Ambulatory Visit: Payer: Medicare Other | Admitting: Cardiovascular Disease

## 2011-10-03 DIAGNOSIS — M5126 Other intervertebral disc displacement, lumbar region: Secondary | ICD-10-CM | POA: Diagnosis not present

## 2011-10-04 ENCOUNTER — Ambulatory Visit: Payer: Medicare Other | Admitting: Physical Therapy

## 2011-10-06 ENCOUNTER — Ambulatory Visit: Payer: Medicare Other | Admitting: Physical Therapy

## 2011-10-10 ENCOUNTER — Ambulatory Visit: Payer: Medicare Other | Admitting: Physical Therapy

## 2011-10-12 ENCOUNTER — Ambulatory Visit: Payer: Medicare Other | Admitting: Physical Therapy

## 2011-10-21 DIAGNOSIS — IMO0002 Reserved for concepts with insufficient information to code with codable children: Secondary | ICD-10-CM | POA: Diagnosis not present

## 2011-10-21 DIAGNOSIS — M5126 Other intervertebral disc displacement, lumbar region: Secondary | ICD-10-CM | POA: Diagnosis not present

## 2011-11-01 DIAGNOSIS — R351 Nocturia: Secondary | ICD-10-CM | POA: Diagnosis not present

## 2011-11-01 DIAGNOSIS — N3010 Interstitial cystitis (chronic) without hematuria: Secondary | ICD-10-CM | POA: Diagnosis not present

## 2011-11-01 DIAGNOSIS — R82998 Other abnormal findings in urine: Secondary | ICD-10-CM | POA: Diagnosis not present

## 2011-11-07 ENCOUNTER — Encounter: Payer: Self-pay | Admitting: Cardiovascular Disease

## 2011-11-07 ENCOUNTER — Ambulatory Visit (INDEPENDENT_AMBULATORY_CARE_PROVIDER_SITE_OTHER): Payer: Medicare Other | Admitting: Cardiovascular Disease

## 2011-11-07 VITALS — BP 128/68 | HR 64 | Ht 63.0 in | Wt 143.1 lb

## 2011-11-07 DIAGNOSIS — I350 Nonrheumatic aortic (valve) stenosis: Secondary | ICD-10-CM

## 2011-11-07 DIAGNOSIS — I4891 Unspecified atrial fibrillation: Secondary | ICD-10-CM

## 2011-11-07 DIAGNOSIS — I359 Nonrheumatic aortic valve disorder, unspecified: Secondary | ICD-10-CM | POA: Diagnosis not present

## 2011-11-07 DIAGNOSIS — I482 Chronic atrial fibrillation, unspecified: Secondary | ICD-10-CM | POA: Insufficient documentation

## 2011-11-07 MED ORDER — RIVAROXABAN 20 MG PO TABS: 20.0000 mg | ORAL_TABLET | Freq: Every day | ORAL | Status: DC

## 2011-11-07 NOTE — Assessment & Plan Note (Addendum)
Brittany [name] is found to have atrial fibrillation today. She had a stroke during this past year and was started on aspirin. Apparently she did not have any atrial fibrillation during her workup.  I do not think this atrial fibrillation is related to her bioprosthetic aortic valve.  I would classify this a non-valvular atrial fibrillation and think that one of the newer anticoagulation agents would be appropriate.  Her ventricular rate is well controlled and she is basically asymptomatic. I do not think that we need any further rate control medications.  She clearly needs anticoagulation since she has a history of a stroke this past year. We'll start her on Xarelto 20 mg a day. She would rather take Xarelto rather than Coumadin.   I see her in 6 months for followup visit.

## 2011-11-07 NOTE — Assessment & Plan Note (Signed)
She status post aortic valve for placement. Valve sounds fairly normal. We'll continue with her current medications.

## 2011-11-07 NOTE — Progress Notes (Addendum)
Brittany [name] Date of Birth  [birth year]       [facility]    [place] [address]  [address], [address] [place], [place]  [address]   [place], [place]  [address] [phone]     [phone]   Fax  [phone]    Fax [phone]  Problem List: 1. Aortic valve replacement 2. Dyslipidemia 3. Hypertension 4. recent stroke-  during the past year  History of Present Illness:  Brittany [name] is an 78 yo with aortic valve replacement, HTN, and hyperlipidemia.  She has not had any cardiac complaints recently.  She complains of easy bruising on her arms.    She had an episode of severe heart burn this past week.    She's had a stroke over the past year. This has left her with some balance issues and some loss of peripheral vision.  Current Outpatient Prescriptions on File Prior to Visit  Medication Sig Dispense Refill  . amLODipine (NORVASC) 10 MG tablet Take 1 tablet (10 mg total) by mouth daily.  30 tablet  3  . aspirin EC 81 MG tablet Take 81 mg by mouth daily.      Marland Kitchen atenolol (TENORMIN) 100 MG tablet Take 0.5 tablets (50 mg total) by mouth daily.      . cholecalciferol (VITAMIN D) 1000 UNITS tablet Take 1,000 Units by mouth daily.      . diazepam (VALIUM) 5 MG tablet Take 5 mg by mouth every 8 (eight) hours as needed. For spasms      . hyoscyamine (LEVSIN SL) 0.125 MG SL tablet Place 0.125 mg under the tongue every 4 (four) hours as needed. For stomach      . Multiple Vitamin (MULITIVITAMIN WITH MINERALS) TABS Take 1 tablet by mouth daily.      . Omega-3 Fatty Acids (FISH OIL PO) Take 1 capsule by mouth daily.       Marland Kitchen omeprazole (PRILOSEC) 20 MG capsule Take 20 mg by mouth daily.       Marland Kitchen oxybutynin (DITROPAN) 5 MG tablet Take 5 mg by mouth 3 (three) times daily.      . simvastatin (ZOCOR) 20 MG tablet Take 10 mg by mouth daily.         Allergies  Allergen Reactions  . Amoxicillin Diarrhea  . Cephalexin Diarrhea  . Codeine Nausea Only  .  Lisinopril Cough  . 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 Nitrofurantoin Monohyd Macro Nausea Only  . Prednisone     Past Medical History  Diagnosis Date  . Aortic stenosis     s/p tissue AVR '05  . Dyslipidemia   . Hypertension   . Dizziness   . History of aortic stenosis     Past Surgical History  Procedure Date  . Abdominal hysterectomy   . Aortic valve replacement     2005 s/p [facility] pericardial tissue valve  . Back surgery     History  Smoking status  . Never Smoker   Smokeless tobacco  . Not on file    History  Alcohol Use No    No family history on file.  Reviw of Systems:  Reviewed in the HPI.  All other systems are negative.  Physical Exam: Blood pressure 128/68, pulse 64, height 5\' 3"  (1.6 m), weight 143 lb 1.9 oz (64.919 kg). General: Well developed, well nourished,  in no acute distress.  Head: Normocephalic, atraumatic, sclera non-icteric, mucus membranes are moist,   Neck: Supple. Carotids are 2 + without bruits. No JVD  Lungs: Clear bilaterally to auscultation.  Heart: irregularly irregular.  normal  S1 S2. No murmurs, gallops or rubs.  Abdomen: Soft, non-tender, non-distended with normal bowel sounds. No hepatomegaly. No rebound/guarding. No masses.  Msk:  Strength and tone are normal  Extremities: No clubbing or cyanosis. No edema.  Distal pedal pulses are 2+ and equal bilaterally.  Neuro: Alert and oriented X 3. Moves all extremities spontaneously.  Psych:  Responds to questions appropriately with a normal affect.  ECG: 11/07/11 - Atrial fibrillation with controlled rate.  Assessment / Plan:

## 2011-11-07 NOTE — Patient Instructions (Addendum)
Your physician wants you to follow-up in: 6 MONTHS  You will receive a reminder letter in the mail two months in advance. If you don't receive a letter, please call our office to schedule the follow-up appointment.  Your physician has recommended you make the following change in your medication:   START XARELTO 20 MG A DAY FOR NEW ONSET AFIB REDUCE ASPIRIN TO 81 MG A DAY.

## 2011-11-08 ENCOUNTER — Telehealth: Payer: Self-pay | Admitting: Cardiovascular Disease

## 2011-11-08 NOTE — Telephone Encounter (Signed)
Pt started on xarelto, gave samples today and forms to see if qualifies for assistance, pt will pick up today.

## 2011-11-08 NOTE — Telephone Encounter (Signed)
Pt was in yesterday and told she has a-fib, has questions

## 2011-11-14 ENCOUNTER — Encounter: Payer: Self-pay | Admitting: Gastroenterology

## 2011-11-17 ENCOUNTER — Ambulatory Visit: Payer: Medicare Other | Attending: Neurology | Admitting: Physical Therapy

## 2011-11-17 DIAGNOSIS — IMO0001 Reserved for inherently not codable concepts without codable children: Secondary | ICD-10-CM | POA: Insufficient documentation

## 2011-11-17 DIAGNOSIS — R269 Unspecified abnormalities of gait and mobility: Secondary | ICD-10-CM | POA: Insufficient documentation

## 2011-11-17 DIAGNOSIS — M6281 Muscle weakness (generalized): Secondary | ICD-10-CM | POA: Insufficient documentation

## 2011-11-18 ENCOUNTER — Telehealth: Payer: Self-pay | Admitting: Cardiovascular Disease

## 2011-11-18 ENCOUNTER — Encounter: Payer: Self-pay | Admitting: Cardiology

## 2011-11-18 ENCOUNTER — Ambulatory Visit (INDEPENDENT_AMBULATORY_CARE_PROVIDER_SITE_OTHER): Payer: Medicare Other | Admitting: Cardiology

## 2011-11-18 ENCOUNTER — Ambulatory Visit (INDEPENDENT_AMBULATORY_CARE_PROVIDER_SITE_OTHER): Payer: Medicare Other | Admitting: *Deleted

## 2011-11-18 VITALS — BP 120/70 | HR 73 | Wt 142.0 lb

## 2011-11-18 DIAGNOSIS — I359 Nonrheumatic aortic valve disorder, unspecified: Secondary | ICD-10-CM | POA: Diagnosis not present

## 2011-11-18 DIAGNOSIS — I350 Nonrheumatic aortic (valve) stenosis: Secondary | ICD-10-CM

## 2011-11-18 DIAGNOSIS — R0609 Other forms of dyspnea: Secondary | ICD-10-CM

## 2011-11-18 DIAGNOSIS — R079 Chest pain, unspecified: Secondary | ICD-10-CM

## 2011-11-18 DIAGNOSIS — R0989 Other specified symptoms and signs involving the circulatory and respiratory systems: Secondary | ICD-10-CM | POA: Diagnosis not present

## 2011-11-18 DIAGNOSIS — I4891 Unspecified atrial fibrillation: Secondary | ICD-10-CM | POA: Diagnosis not present

## 2011-11-18 NOTE — Telephone Encounter (Signed)
I spoke with Dr. Daleen Squibb regarding the patient's symptoms. He would like the patient to come in for an EKG. The patient is aware and agreeable. She will come in for a nurse visit this afternoon.

## 2011-11-18 NOTE — Assessment & Plan Note (Signed)
I think her chest discomfort with dyspnea exertion is her A. fib. He is probably going in and out of it. Her rate is well-controlled at rest.  There is no documentation of any coronary disease.  I've asked her to take her atenolol at night to hopefully better protect her from symptomatic A. Fib in the mornings. We also talked about the unpredictable nature of A. fib and the importance of rate control. After a good discussion, she felt much more comfortable now that she says she understands what to expect.

## 2011-11-18 NOTE — Assessment & Plan Note (Signed)
Status post valve replacement 

## 2011-11-18 NOTE — Progress Notes (Signed)
HPI This ran comes in today because of some increased shortness of breath with exertion and some chest discomfort going into her left axilla.  I reviewed the chart with Brittany [name]. We felt she should come in for an EKG vital signs and O2 sat.  She developed this this morning while making her bed. She is a new diagnosis of A. fib on last visit with Dr. Elease Hashimoto. She is on anticoagulation and is on rate control with atenolol.  She denies orthopnea, PND, edema, palpitations or syncope. Her last echo was a rare this year with an EF of 55-60%. Her prosthetic valve was working fine.  Past Medical History  Diagnosis Date  . Aortic stenosis     s/p tissue AVR '05  . Dyslipidemia   . Hypertension   . Dizziness   . History of aortic stenosis     Current Outpatient Prescriptions  Medication Sig Dispense Refill  . amLODipine (NORVASC) 10 MG tablet Take 1 tablet (10 mg total) by mouth daily.  30 tablet  3  . aspirin EC 81 MG tablet Take 81 mg by mouth daily.      Marland Kitchen atenolol (TENORMIN) 50 MG tablet Take 50 mg by mouth daily.      . cholecalciferol (VITAMIN D) 1000 UNITS tablet Take 1,000 Units by mouth daily.      . diazepam (VALIUM) 5 MG tablet Take 5 mg by mouth every 8 (eight) hours as needed. For spasms      . hyoscyamine (LEVSIN SL) 0.125 MG SL tablet Place 0.125 mg under the tongue every 4 (four) hours as needed. For stomach      . Multiple Vitamin (MULITIVITAMIN WITH MINERALS) TABS Take 1 tablet by mouth daily.      . Omega-3 Fatty Acids (FISH OIL PO) Take 1 capsule by mouth daily.       Marland Kitchen omeprazole (PRILOSEC) 20 MG capsule Take 20 mg by mouth daily.       Marland Kitchen oxybutynin (DITROPAN) 5 MG tablet Take 5 mg by mouth 3 (three) times daily.      . Rivaroxaban (XARELTO) 20 MG TABS Take 1 tablet (20 mg total) by mouth daily.  30 tablet  6  . simvastatin (ZOCOR) 20 MG tablet Take 10 mg by mouth daily.         Allergies  Allergen Reactions  . Amoxicillin Diarrhea  . Cephalexin Diarrhea  . Codeine  Nausea Only  . Lisinopril Cough  . 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 Nitrofurantoin Monohyd Macro Nausea Only  . Prednisone     No family history on file.  History   Social History  . Marital Status: Widowed    Spouse Name: N/A    Number of Children: N/A  . Years of Education: N/A   Occupational History  . Not on file.   Social History Main Topics  . Smoking status: Never Smoker   . Smokeless tobacco: Not on file  . Alcohol Use: No  . Drug Use: No  . Sexually Active: Not on file   Other Topics Concern  . Not on file   Social History Narrative  . No narrative on file    ROS ALL NEGATIVE EXCEPT THOSE NOTED IN HPI  PE  General Appearance: well developed, well nourished in no acute distress HEENT: symmetrical face, PERRLA, good dentition  Neck: no JVD, thyromegaly, or adenopathy, trachea midline Chest: symmetric without deformity  Cardiac: PMI non-displaced, irregular rate and rhythm, normal S1, S2, no gallop or murmur Lung: clear to ausculation and percussion Vascular: all pulses full without bruits  Abdominal: nondistended, nontender, good bowel sounds, no HSM, no bruits Extremities: no cyanosis, clubbing or edema, no sign of DVT, no varicosities  Skin: normal color, no rashes Neuro: alert and oriented x 3, non-focal Pysch: normal affect  EKG A. fib, well-controlled ventricular rate. BMET    Component Value Date/Time   NA 138 06/06/2011 1023   K 3.7 06/06/2011 1023   CL 98 06/06/2011 1023   CO2 30 06/06/2011 1023   GLUCOSE 156* 06/06/2011 1023   BUN 18 06/06/2011 1023   CREATININE 0.59 06/06/2011 1023   CALCIUM 10.1 06/06/2011 1023   GFRNONAA 83* 06/06/2011 1023   GFRAA >90 06/06/2011 1023    Lipid Panel  No results found for this basename: chol, trig, hdl, cholhdl, vldl, ldlcalc    CBC    Component Value Date/Time   WBC 11.9* 06/06/2011 1023   RBC 5.19* 06/06/2011 1023   HGB  14.5 06/06/2011 1023   HCT 43.5 06/06/2011 1023   PLT 264 06/06/2011 1023   MCV 83.8 06/06/2011 1023   MCH 27.9 06/06/2011 1023   MCHC 33.3 06/06/2011 1023   RDW 14.6 06/06/2011 1023   LYMPHSABS 4.6* 06/06/2011 1023   MONOABS 1.2* 06/06/2011 1023   EOSABS 0.2 06/06/2011 1023   BASOSABS 0.1 06/06/2011 1023

## 2011-11-18 NOTE — Patient Instructions (Signed)
Your physician has recommended you make the following change in your medication:  1) take atenolol 50 mg one tablet by mouth at bedtime.

## 2011-11-18 NOTE — Telephone Encounter (Signed)
New msg Pt said she has been having some sob over last two weeks. Please call

## 2011-11-18 NOTE — Telephone Encounter (Signed)
I spoke with the patient. She states that she has had some SOB over the last couple of weeks, but today this seems worse for her and is occuring at rest. She is having some mild chest tightness and a little soreness to her left arm. She denies any edema. She is on xarelto for her a-fib. She checked her BP today with readings of 147/90 and 126/70. She states her HR has been between 60-70. She also reports increased problems with acid reflux. She is pending an appointment with Dr. Arlyce Dice for evaluation of this. She takes omeprazole daily. I explained to the patient I am not certain if her symptoms are coming from her a-fib. I will review with Dr. Daleen Squibb (DOD) and call her back. She is agreeable.

## 2011-11-18 NOTE — Progress Notes (Signed)
Patient ended up being placed on Dr. Vern Claude schedule.

## 2011-11-22 ENCOUNTER — Ambulatory Visit: Payer: Medicare Other | Admitting: *Deleted

## 2011-11-23 ENCOUNTER — Ambulatory Visit: Payer: Medicare Other | Admitting: Physical Therapy

## 2011-11-29 ENCOUNTER — Ambulatory Visit: Payer: Medicare Other | Admitting: *Deleted

## 2011-12-01 ENCOUNTER — Ambulatory Visit: Payer: Medicare Other | Admitting: Physical Therapy

## 2011-12-02 ENCOUNTER — Ambulatory Visit: Payer: Medicare Other | Admitting: Physical Therapy

## 2011-12-06 ENCOUNTER — Ambulatory Visit: Payer: Medicare Other | Admitting: Physical Therapy

## 2011-12-07 DIAGNOSIS — H40019 Open angle with borderline findings, low risk, unspecified eye: Secondary | ICD-10-CM | POA: Diagnosis not present

## 2011-12-08 ENCOUNTER — Ambulatory Visit: Payer: Medicare Other | Admitting: Physical Therapy

## 2011-12-13 ENCOUNTER — Ambulatory Visit: Payer: Medicare Other | Attending: Neurology | Admitting: Physical Therapy

## 2011-12-13 DIAGNOSIS — M6281 Muscle weakness (generalized): Secondary | ICD-10-CM | POA: Insufficient documentation

## 2011-12-13 DIAGNOSIS — R269 Unspecified abnormalities of gait and mobility: Secondary | ICD-10-CM | POA: Insufficient documentation

## 2011-12-13 DIAGNOSIS — IMO0001 Reserved for inherently not codable concepts without codable children: Secondary | ICD-10-CM | POA: Insufficient documentation

## 2011-12-15 ENCOUNTER — Ambulatory Visit: Payer: Medicare Other | Admitting: Physical Therapy

## 2011-12-19 ENCOUNTER — Ambulatory Visit: Payer: Medicare Other | Admitting: Physical Therapy

## 2011-12-20 ENCOUNTER — Telehealth: Payer: Self-pay | Admitting: Cardiovascular Disease

## 2011-12-20 NOTE — Telephone Encounter (Signed)
New problem:  C/O side effect from xarelto  .

## 2011-12-20 NOTE — Telephone Encounter (Signed)
Pt gets epidural injections 3-4 times a year for a ruptured disc. Pt having back pain and wants to know how to handle Xarelto dosing. Pt has new onset Afib and HX of valve replacement. I will consult with pharmacist Weston Brass.

## 2011-12-20 NOTE — Telephone Encounter (Signed)
Told pt to call if or when she gets an injection, no injection is scheduled. Pt informed she will just need to hold 24-48 hrs prior and resume in 24 hrs. Pt verbalized understanding to call for instructions prior to injection.

## 2011-12-21 ENCOUNTER — Ambulatory Visit: Payer: Medicare Other | Admitting: Physical Therapy

## 2011-12-22 DIAGNOSIS — S9030XA Contusion of unspecified foot, initial encounter: Secondary | ICD-10-CM | POA: Diagnosis not present

## 2011-12-26 ENCOUNTER — Encounter: Payer: Self-pay | Admitting: Gastroenterology

## 2011-12-26 ENCOUNTER — Ambulatory Visit (INDEPENDENT_AMBULATORY_CARE_PROVIDER_SITE_OTHER): Payer: Medicare Other | Admitting: Gastroenterology

## 2011-12-26 VITALS — BP 120/60 | HR 66 | Ht 61.0 in | Wt 144.6 lb

## 2011-12-26 DIAGNOSIS — R1310 Dysphagia, unspecified: Secondary | ICD-10-CM | POA: Insufficient documentation

## 2011-12-26 DIAGNOSIS — R079 Chest pain, unspecified: Secondary | ICD-10-CM | POA: Diagnosis not present

## 2011-12-26 DIAGNOSIS — K219 Gastro-esophageal reflux disease without esophagitis: Secondary | ICD-10-CM | POA: Diagnosis not present

## 2011-12-26 DIAGNOSIS — M25579 Pain in unspecified ankle and joints of unspecified foot: Secondary | ICD-10-CM | POA: Diagnosis not present

## 2011-12-26 NOTE — Patient Instructions (Addendum)
We have scheduled you an appointment to see Dr Daleen Squibb on 02/08/2012 at 2:15 If you are having difficulties before your appointment please contact their office for an emergent appointment

## 2011-12-26 NOTE — Assessment & Plan Note (Addendum)
Rule out esophageal stricture  Recommendations #1 upper endoscopy with dilatation as indicated. We'll check with her cardiologist whether xarelto can be held or whether  She will require a Lovenox bridge.

## 2011-12-26 NOTE — Assessment & Plan Note (Addendum)
While her recent episodes of chest pain could be due to acid reflux, it certainly could be attributable to cardiac pain from ischemia. Although adjustments in her medications will be done, I think cardiac reevaluation is in order.  Recommendations #1 increase Prilosec to 40 mg daily #2 cardiac reevaluation #3 liquid antacids as needed for breakthrough pyrosis

## 2011-12-26 NOTE — Progress Notes (Signed)
History of Present Illness: Pleasant 78 year old white female followed by [facility] for a long history of GERD, CVA, on xarelto, status post aortic valve replacement,  here for evaluation of chest pain. She has mild reflux symptoms consisting of pyrosis. At times she's had more severe burning chest discomfort. Approximately a month ago she had an episode of very severe burning chest pain that radiated to her back, jaw and left upper extremity. This lasted several hours despite taking extra Prilosec and antacids and milk. Since that time she's had other less severe episodes of chest pain. She's now complaining of dysphagia to solids. She's on no gastric irritants.    Past Medical History  Diagnosis Date  . Aortic stenosis     s/p tissue AVR '05  . Dyslipidemia   . Hypertension   . Dizziness   . History of aortic stenosis   . GERD (gastroesophageal reflux disease)   . Hyperplastic colon polyp   . Hiatal hernia   . Arthritis   . Depression   . Urine incontinence   . Atrial fibrillation   . Diverticulosis   . Hx of gallstones   . Stroke   . IC (interstitial cystitis)    Past Surgical History  Procedure Date  . Abdominal hysterectomy 1967  . Aortic valve replacement     2005 s/p [facility] pericardial tissue valve  . Back surgery   . Appendectom;y 1191  . Cholecystectomy 1993   family history includes Alcohol abuse in her sisters; Bipolar disorder in her brother; Colon cancer in her maternal aunt; Coronary artery disease in her brother; and Heart disease in her father. Current Outpatient Prescriptions  Medication Sig Dispense Refill  . amLODipine (NORVASC) 10 MG tablet Take 10 mg by mouth daily. 1/2 tablet every day      . aspirin EC 81 MG tablet Take 81 mg by mouth daily.      Marland Kitchen atenolol (TENORMIN) 50 MG tablet Take 50 mg by mouth daily.      . cholecalciferol (VITAMIN D) 1000 UNITS tablet Take 1,000 Units by mouth daily.      . diazepam (VALIUM) 5 MG tablet Take 5 mg by  mouth every 8 (eight) hours as needed. For spasms      . hyoscyamine (LEVSIN SL) 0.125 MG SL tablet Place 0.125 mg under the tongue every 4 (four) hours as needed. For stomach      . Multiple Vitamin (MULITIVITAMIN WITH MINERALS) TABS Take 1 tablet by mouth daily.      . Omega-3 Fatty Acids (FISH OIL PO) Take 1 capsule by mouth daily.       Marland Kitchen omeprazole (PRILOSEC) 20 MG capsule Take 20 mg by mouth daily.       Marland Kitchen oxybutynin (DITROPAN) 5 MG tablet Take 5 mg by mouth 3 (three) times daily.      . Rivaroxaban (XARELTO) 20 MG TABS Take 1 tablet (20 mg total) by mouth daily.  30 tablet  6  . simvastatin (ZOCOR) 20 MG tablet Take 10 mg by mouth daily. 1/2 tablet everyday      . DISCONTD: amLODipine (NORVASC) 10 MG tablet Take 1 tablet (10 mg total) by mouth daily.  30 tablet  3   Allergies as of 12/26/2011 - Review Complete 12/26/2011  Allergen Reaction Noted  . Amoxicillin Diarrhea 01/06/2011  . Cephalexin Diarrhea 01/06/2011  . Codeine Nausea Only 01/06/2011  . Lisinopril Cough 01/06/2011  . Morphine and related Other (See Comments) 01/06/2011  . Neurontin (  gabapentin)  12/26/2011  . Nitrofurantoin monohyd macro Nausea Only 01/06/2011  . Prednisone Nausea Only 06/15/2011    reports that she has never smoked. She has never used smokeless tobacco. She reports that she does not drink alcohol or use illicit drugs.     Review of Systems: Pertinent positive and negative review of systems were noted in the above HPI section. All other review of systems were otherwise negative.  Vital signs were reviewed in today's medical record Physical Exam: General: Well developed , well nourished, no acute distress Head: Normocephalic and atraumatic Eyes:  sclerae anicteric, EOMI Ears: Normal auditory acuity Mouth: No deformity or lesions Neck: Supple, no masses or thyromegaly Lungs: Clear throughout to auscultation Heart: Regular rate and rhythm; no murmurs, rubs or bruits Abdomen: Soft, non tender and  non distended. No masses, hepatosplenomegaly or hernias noted. Normal Bowel sounds Rectal:deferred Musculoskeletal: Symmetrical with no gross deformities  Skin: No lesions on visible extremities Pulses:  Normal pulses noted Extremities: No clubbing, cyanosis, edema or deformities noted Neurological: Alert oriented x 4, grossly nonfocal Cervical Nodes:  No significant cervical adenopathy Inguinal Nodes: No significant inguinal adenopathy Psychological:  Alert and cooperative. Normal mood and affect

## 2011-12-27 ENCOUNTER — Telehealth: Payer: Self-pay | Admitting: *Deleted

## 2011-12-27 NOTE — Telephone Encounter (Signed)
Will forward question to Dr Elease Hashimoto regarding Xarelto.

## 2011-12-28 ENCOUNTER — Telehealth: Payer: Self-pay | Admitting: *Deleted

## 2011-12-28 DIAGNOSIS — R0789 Other chest pain: Secondary | ICD-10-CM

## 2011-12-28 NOTE — Telephone Encounter (Signed)
Pt was notified that Dr Elease Hashimoto and Dr Arlyce Dice discussed her recent episode of CP and Dr Elease Hashimoto would like a Lexiscan ordered, pt informed and agreed to plan, will forward to [facility] to schedule.

## 2011-12-29 ENCOUNTER — Telehealth: Payer: Self-pay | Admitting: *Deleted

## 2011-12-29 DIAGNOSIS — L03119 Cellulitis of unspecified part of limb: Secondary | ICD-10-CM | POA: Diagnosis not present

## 2011-12-29 DIAGNOSIS — L02619 Cutaneous abscess of unspecified foot: Secondary | ICD-10-CM | POA: Diagnosis not present

## 2011-12-29 NOTE — Telephone Encounter (Signed)
Left message for patient to call and schedule stress test. °

## 2011-12-29 NOTE — Telephone Encounter (Signed)
New Problem:    Called in wanting to know if the patient can stop her Rivaroxaban (XARELTO) 20 MG TABS to receive an Epidural Steroid injection.  Please call back.

## 2011-12-29 NOTE — Telephone Encounter (Signed)
msg left with dr Ollen Bowl she may hold xarelto for 2 days prior to injection and a msg left with pt. Return call number provided for questions.

## 2011-12-30 ENCOUNTER — Telehealth: Payer: Self-pay | Admitting: *Deleted

## 2011-12-30 NOTE — Telephone Encounter (Signed)
Pt was set for lexiscan after dr Elease Hashimoto spoke with pt.

## 2011-12-30 NOTE — Telephone Encounter (Signed)
Message copied by Antony Odea on Fri Dec 30, 2011 11:45 AM ------      Message from: Vesta Mixer      Created: Wed Dec 28, 2011  5:56 AM       II suspect that her symptoms are due to the esophageal stenosis/ reflux.  I am certainly willing to order a Lexiscan myoview to hclp clarify the etiology of the CP.             I will have my nurse arrange for her to have a Lexiscan myoview this week.            She has had a stroke (presumably related to her atrial fibrillation) and is at higher risk for CVA if/when we stop the Xarelto.  Xarelto is a very rapid acting medication and it will only need to be held for 1-2 days prior to a procedure.  I dont think there is much reason to bridge with Lovenox since she the "hold" time is short ( as compared to ~10 days on inadequate anticoagulation that you get with holding and restarting coumadin)  I will ask Weston Brass, our pharmacist to comment on our "standard recommendations" re: hold time for Xarelto so that I'm consistent with our usual practice.            Phil                  ----- Message -----         From: Louis Meckel, MD         Sent: 12/26/2011   2:36 PM           To: Vesta Mixer, MD            Phil,      Please reevaluate for recent episode of very severe chest pain. While it could be due to reflux, there are certainly elements that suggest cardiac etiology. Also, she is going to need esophageal dilatation for presumed stricture. Question is whether xarelto can be held  Or whether she requires bridging with Lovenox.

## 2011-12-31 DIAGNOSIS — L02619 Cutaneous abscess of unspecified foot: Secondary | ICD-10-CM | POA: Diagnosis not present

## 2011-12-31 DIAGNOSIS — L03119 Cellulitis of unspecified part of limb: Secondary | ICD-10-CM | POA: Diagnosis not present

## 2012-01-01 ENCOUNTER — Emergency Department (HOSPITAL_COMMUNITY): Payer: Medicare Other

## 2012-01-01 ENCOUNTER — Encounter (HOSPITAL_COMMUNITY): Payer: Self-pay | Admitting: Emergency Medicine

## 2012-01-01 ENCOUNTER — Inpatient Hospital Stay (HOSPITAL_COMMUNITY)
Admission: EM | Admit: 2012-01-01 | Discharge: 2012-01-05 | DRG: 603 | Disposition: A | Discharge status: Home / Self Care | Payer: Medicare Other | Attending: Internal Medicine | Admitting: Internal Medicine

## 2012-01-01 DIAGNOSIS — I350 Nonrheumatic aortic (valve) stenosis: Secondary | ICD-10-CM

## 2012-01-01 DIAGNOSIS — Z66 Do not resuscitate: Secondary | ICD-10-CM | POA: Diagnosis present

## 2012-01-01 DIAGNOSIS — S9030XA Contusion of unspecified foot, initial encounter: Secondary | ICD-10-CM | POA: Diagnosis present

## 2012-01-01 DIAGNOSIS — M79609 Pain in unspecified limb: Secondary | ICD-10-CM | POA: Diagnosis not present

## 2012-01-01 DIAGNOSIS — I10 Essential (primary) hypertension: Secondary | ICD-10-CM | POA: Diagnosis present

## 2012-01-01 DIAGNOSIS — Z952 Presence of prosthetic heart valve: Secondary | ICD-10-CM

## 2012-01-01 DIAGNOSIS — E785 Hyperlipidemia, unspecified: Secondary | ICD-10-CM | POA: Diagnosis present

## 2012-01-01 DIAGNOSIS — R079 Chest pain, unspecified: Secondary | ICD-10-CM

## 2012-01-01 DIAGNOSIS — Z043 Encounter for examination and observation following other accident: Secondary | ICD-10-CM | POA: Diagnosis not present

## 2012-01-01 DIAGNOSIS — I4891 Unspecified atrial fibrillation: Secondary | ICD-10-CM

## 2012-01-01 DIAGNOSIS — Z79899 Other long term (current) drug therapy: Secondary | ICD-10-CM | POA: Diagnosis not present

## 2012-01-01 DIAGNOSIS — R0789 Other chest pain: Secondary | ICD-10-CM | POA: Diagnosis not present

## 2012-01-01 DIAGNOSIS — R1310 Dysphagia, unspecified: Secondary | ICD-10-CM

## 2012-01-01 DIAGNOSIS — L03119 Cellulitis of unspecified part of limb: Secondary | ICD-10-CM | POA: Diagnosis not present

## 2012-01-01 DIAGNOSIS — L02619 Cutaneous abscess of unspecified foot: Principal | ICD-10-CM | POA: Diagnosis present

## 2012-01-01 DIAGNOSIS — I482 Chronic atrial fibrillation, unspecified: Secondary | ICD-10-CM | POA: Diagnosis present

## 2012-01-01 DIAGNOSIS — K219 Gastro-esophageal reflux disease without esophagitis: Secondary | ICD-10-CM | POA: Diagnosis present

## 2012-01-01 DIAGNOSIS — L02419 Cutaneous abscess of limb, unspecified: Secondary | ICD-10-CM | POA: Diagnosis not present

## 2012-01-01 DIAGNOSIS — Z8673 Personal history of transient ischemic attack (TIA), and cerebral infarction without residual deficits: Secondary | ICD-10-CM

## 2012-01-01 DIAGNOSIS — Z7901 Long term (current) use of anticoagulants: Secondary | ICD-10-CM

## 2012-01-01 DIAGNOSIS — W208XXA Other cause of strike by thrown, projected or falling object, initial encounter: Secondary | ICD-10-CM | POA: Diagnosis present

## 2012-01-01 LAB — CBC WITH DIFFERENTIAL/PLATELET
Eosinophils Absolute: 0.2 10*3/uL (ref 0.0–0.7)
HCT: 39.6 % (ref 36.0–46.0)
Hemoglobin: 13.4 g/dL (ref 12.0–15.0)
Lymphs Abs: 3.1 10*3/uL (ref 0.7–4.0)
MCH: 29.1 pg (ref 26.0–34.0)
MCHC: 33.8 g/dL (ref 30.0–36.0)
Monocytes Absolute: 1.7 10*3/uL — ABNORMAL HIGH (ref 0.1–1.0)
Monocytes Relative: 12 % (ref 3–12)
Neutrophils Relative %: 65 % (ref 43–77)
RBC: 4.61 MIL/uL (ref 3.87–5.11)

## 2012-01-01 LAB — BASIC METABOLIC PANEL
BUN: 17 mg/dL (ref 6–23)
Chloride: 99 mEq/L (ref 96–112)
Creatinine, Ser: 0.66 mg/dL (ref 0.50–1.10)
GFR calc non Af Amer: 80 mL/min — ABNORMAL LOW (ref >90)
Glucose, Bld: 113 mg/dL — ABNORMAL HIGH (ref 70–99)
Potassium: 4 mEq/L (ref 3.5–5.1)

## 2012-01-01 MED ORDER — CLINDAMYCIN PHOSPHATE 600 MG/50ML IV SOLN
600.0000 mg | Freq: Once | INTRAVENOUS | Status: AC
  Administered 2012-01-01: 600 mg via INTRAVENOUS
  Filled 2012-01-01: qty 50

## 2012-01-01 MED ORDER — PANTOPRAZOLE SODIUM 40 MG PO TBEC
40.0000 mg | DELAYED_RELEASE_TABLET | Freq: Two times a day (BID) | ORAL | Status: DC
  Administered 2012-01-02 – 2012-01-05 (×7): 40 mg via ORAL
  Filled 2012-01-01 (×9): qty 1

## 2012-01-01 MED ORDER — RIVAROXABAN 20 MG PO TABS
20.0000 mg | ORAL_TABLET | Freq: Every day | ORAL | Status: DC
  Administered 2012-01-02 – 2012-01-05 (×4): 20 mg via ORAL
  Filled 2012-01-01 (×4): qty 1

## 2012-01-01 MED ORDER — POLYETHYLENE GLYCOL 3350 17 G PO PACK
17.0000 g | PACK | Freq: Every day | ORAL | Status: DC | PRN
  Filled 2012-01-01: qty 1

## 2012-01-01 MED ORDER — SODIUM CHLORIDE 0.9 % IJ SOLN
3.0000 mL | Freq: Two times a day (BID) | INTRAMUSCULAR | Status: DC
  Administered 2012-01-04: 3 mL via INTRAVENOUS

## 2012-01-01 MED ORDER — ATENOLOL 50 MG PO TABS
50.0000 mg | ORAL_TABLET | Freq: Every day | ORAL | Status: DC
  Administered 2012-01-02: 50 mg via ORAL
  Filled 2012-01-01 (×2): qty 1

## 2012-01-01 MED ORDER — ONDANSETRON HCL 4 MG/2ML IJ SOLN
4.0000 mg | Freq: Four times a day (QID) | INTRAMUSCULAR | Status: DC | PRN
  Administered 2012-01-04: 4 mg via INTRAVENOUS
  Filled 2012-01-01: qty 2

## 2012-01-01 MED ORDER — SODIUM CHLORIDE 0.9 % IJ SOLN: 3.0000 mL | INTRAMUSCULAR | Status: DC | PRN

## 2012-01-01 MED ORDER — SODIUM CHLORIDE 0.9 % IV SOLN: 250.0000 mL | INTRAVENOUS | Status: DC | PRN

## 2012-01-01 MED ORDER — ONDANSETRON HCL 4 MG PO TABS
4.0000 mg | ORAL_TABLET | Freq: Four times a day (QID) | ORAL | Status: DC | PRN
  Administered 2012-01-02: 4 mg via ORAL
  Filled 2012-01-01: qty 1

## 2012-01-01 MED ORDER — SACCHAROMYCES BOULARDII 250 MG PO CAPS
250.0000 mg | ORAL_CAPSULE | Freq: Two times a day (BID) | ORAL | Status: DC
  Administered 2012-01-01 – 2012-01-05 (×8): 250 mg via ORAL
  Filled 2012-01-01 (×9): qty 1

## 2012-01-01 MED ORDER — SODIUM CHLORIDE 0.9 % IV SOLN: INTRAVENOUS | Status: DC

## 2012-01-01 MED ORDER — OXYBUTYNIN CHLORIDE 5 MG PO TABS
5.0000 mg | ORAL_TABLET | Freq: Three times a day (TID) | ORAL | Status: DC
  Administered 2012-01-01 – 2012-01-05 (×11): 5 mg via ORAL
  Filled 2012-01-01 (×13): qty 1

## 2012-01-01 MED ORDER — ASPIRIN EC 81 MG PO TBEC
81.0000 mg | DELAYED_RELEASE_TABLET | Freq: Every day | ORAL | Status: DC
  Administered 2012-01-02 – 2012-01-05 (×4): 81 mg via ORAL
  Filled 2012-01-01 (×4): qty 1

## 2012-01-01 MED ORDER — SIMVASTATIN 10 MG PO TABS
10.0000 mg | ORAL_TABLET | Freq: Every day | ORAL | Status: DC
  Administered 2012-01-02 – 2012-01-04 (×3): 10 mg via ORAL
  Filled 2012-01-01 (×4): qty 1

## 2012-01-01 MED ORDER — ACETAMINOPHEN 325 MG PO TABS
650.0000 mg | ORAL_TABLET | Freq: Four times a day (QID) | ORAL | Status: DC | PRN
  Administered 2012-01-04: 650 mg via ORAL
  Filled 2012-01-01: qty 2

## 2012-01-01 MED ORDER — ALUM & MAG HYDROXIDE-SIMETH 200-200-20 MG/5ML PO SUSP: 30.0000 mL | Freq: Four times a day (QID) | ORAL | Status: DC | PRN

## 2012-01-01 MED ORDER — AMLODIPINE BESYLATE 5 MG PO TABS
5.0000 mg | ORAL_TABLET | Freq: Every day | ORAL | Status: DC
  Administered 2012-01-02: 5 mg via ORAL
  Filled 2012-01-01 (×2): qty 1

## 2012-01-01 MED ORDER — CLINDAMYCIN PHOSPHATE 600 MG/50ML IV SOLN
600.0000 mg | Freq: Four times a day (QID) | INTRAVENOUS | Status: DC
  Administered 2012-01-01 – 2012-01-05 (×14): 600 mg via INTRAVENOUS
  Filled 2012-01-01 (×17): qty 50

## 2012-01-01 MED ORDER — ACETAMINOPHEN 650 MG RE SUPP: 650.0000 mg | Freq: Four times a day (QID) | RECTAL | Status: DC | PRN

## 2012-01-01 MED ORDER — DIAZEPAM 5 MG PO TABS
5.0000 mg | ORAL_TABLET | Freq: Three times a day (TID) | ORAL | Status: DC | PRN
  Administered 2012-01-04: 5 mg via ORAL
  Filled 2012-01-01: qty 1

## 2012-01-01 NOTE — H&P (Signed)
History and Physical  Brittany [name]:[id number] DOB: 03-25-1928 DOA: 01/01/2012  Referring physician: Chaney Malling, MD PCP: Lupita Raider, MD  Cardiologist: Katherina Right, MD GI: Melvia Heaps, MD  Chief Complaint: left foot redness and pain  HPI:  78 year old woman presented to ED with erythema and pain in foot. 9/13 dropped coffee mug on foot, developed bruise and hematoma (on Xarelto) but no break in skin. Top of hematoma turned red, saw PA at [facility] 9/19--dx hematoma, rx rest, elevation, ice. Foot worsened and was seen again at [facility] 9/23 and started on doxycycline (was compliant). Failed to improve, saw PCP (actually Dr. Clovis Riley 9/26--I&D was performed (no pus) and culture sent. Saw PCP 9/21 for continued worsening, changed to Cipro and border was drawn; erythema continued to extend and patient presented today with cellulitis of dorsum of foot.  History of Staph infection 30 years post-op back surgery which resulted in left foot drop, but has never had cellulitis of left leg or foot.  Chart Review:  Incidental--being evaluated for GERD and chest pain by Drs. Arlyce Dice Nahser as outpatient with outpatient nuclear stress planned 9/25.  Review of Systems:  Negative for fever, visual changes, sore throat, new muscle aches, chest pain, SOB, dysuria, bleeding, n/v/abdominal pain.  Past Medical History  Diagnosis Date  . Aortic stenosis     s/p tissue AVR '05  . Dyslipidemia   . Hypertension   . Dizziness   . History of aortic stenosis   . GERD (gastroesophageal reflux disease)   . Hyperplastic colon polyp   . Hiatal hernia   . Arthritis   . Depression   . Urine incontinence   . Atrial fibrillation   . Diverticulosis   . Hx of gallstones   . Stroke   . IC (interstitial cystitis)     Past Surgical History  Procedure Date  . Abdominal hysterectomy 1967  . Aortic valve replacement     2005 s/p [facility] pericardial tissue valve  . Back surgery   .  Appendectom;y 8119  . Cholecystectomy 1993    Social History:  reports that she has never smoked. She has never used smokeless tobacco. She reports that she does not drink alcohol or use illicit drugs.  Allergies  Allergen Reactions  . Amoxicillin Diarrhea  . Cephalexin Diarrhea  . Codeine Nausea Only  . Lisinopril Cough  . 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 Neurontin (Gabapentin)     Legs swell  . Nitrofurantoin Monohyd Macro Nausea Only  . Prednisone Nausea Only  . Sulfa Antibiotics     Family History  Problem Relation Age of Onset  . Heart disease Father   . Coronary artery disease Brother   . Bipolar disorder Brother     commited suiside at 73yo  . Alcohol abuse Sister     sister #1  . Alcohol abuse Sister     sister #2  . Colon cancer Maternal Aunt      Prior to Admission medications   Medication Sig Start Date End Date Taking? Authorizing Provider  amLODipine (NORVASC) 10 MG tablet Take 5 mg by mouth daily. 1/2 tablet every day 06/06/11 06/05/12 Yes Jessica A Hope, PA-C  aspirin EC 81 MG tablet Take 81 mg by mouth daily.   Yes Historical Provider, MD  atenolol (TENORMIN) 50 MG tablet Take 50 mg by mouth daily.   Yes Historical Provider, MD  cholecalciferol (VITAMIN  D) 1000 UNITS tablet Take 1,000 Units by mouth daily.   Yes Historical Provider, MD  diazepam (VALIUM) 5 MG tablet Take 5 mg by mouth every 8 (eight) hours as needed. For spasms   Yes Historical Provider, MD  Omega-3 Fatty Acids (FISH OIL PO) Take 1 capsule by mouth daily.    Yes Historical Provider, MD  omeprazole (PRILOSEC) 20 MG capsule Take 20 mg by mouth 2 (two) times daily.    Yes Historical Provider, MD  oxybutynin (DITROPAN) 5 MG tablet Take 5 mg by mouth 3 (three) times daily.   Yes Historical Provider, MD  Rivaroxaban (XARELTO) 20 MG TABS Take 1 tablet (20 mg total) by mouth daily. 11/07/11  Yes Vesta Mixer, MD    simvastatin (ZOCOR) 20 MG tablet Take 10 mg by mouth daily. 1/2 tablet everyday   Yes Historical Provider, MD   Physical Exam: Filed Vitals:   01/01/12 1454  BP: 139/78  Pulse: 89  Temp: 98.3 F (36.8 C)  TempSrc: Oral  Resp: 23  SpO2: 97%   General:  Examined in ED. Appears calm and comfortable. Eyes: PERRL, normal lids, irises & conjunctiva ENT: mildly hard of hearing; normal lips & tongue Neck: no LAD, masses or thyromegaly Cardiovascular: RRR, no m/r/g. No LE edema. Respiratory: CTA bilaterally, no w/r/r. Normal respiratory effort. Abdomen: soft, ntnd Skin: generally unremarkable. LLE: no groin adenopathy or pain, no thigh pain or erythema, no calf pain or lower left leg edema or erythema. Left foot--dorsum erythematous, mild edema lateral dorsum without fluctuance, small area s/p biopsy, no drainage. Not tender to light touch. No pain or erythema bottom of foot or in toes. Musculoskeletal: grossly normal tone BUE/BLE Psychiatric: grossly normal mood and affect, speech fluent and appropriate Neurologic: grossly non-focal.  Wt Readings from Last 3 Encounters:  12/26/11 65.59 kg (144 lb 9.6 oz)  11/18/11 64.411 kg (142 lb)  11/07/11 64.919 kg (143 lb 1.9 oz)   Labs on Admission:  Basic Metabolic Panel:  Lab 01/01/12 0102  NA 134*  K 4.0  CL 99  CO2 24  GLUCOSE 113*  BUN 17  CREATININE 0.66  CALCIUM 9.8  MG --  PHOS --   CBC:  Lab 01/01/12 1606  WBC 14.5*  NEUTROABS 9.4*  HGB 13.4  HCT 39.6  MCV 85.9  PLT 335   Radiological Exams on Admission: Dg Foot Complete Left  01/01/2012  *RADIOLOGY REPORT*  Clinical Data: Injury  LEFT FOOT - COMPLETE 3+ VIEW  Comparison: 02/03/2007  Findings: Three views of the left foot submitted.  Stable prior fusion of the phalanges third and fourth toe.  Again noted resection of distal aspect proximal phalanx fifth toe.  Diffuse osteopenia.  No acute fracture or subluxation.  IMPRESSION: No acute fracture or subluxation.  Diffuse  osteopenia.  Stable postoperative changes as described above.   Original Report Authenticated By: Natasha Mead, M.D.    Principal Problem:  *Cellulitis of foot Active Problems:  Hypertension  Atrial fibrillation  Esophageal reflux   Assessment/Plan 1. LLE cellulitis--post-traumatic. Failed outpatient antibiotics (doxycycline). Already improving with clindamycin per patient (family agrees). No reason to suspect gram negatives or MDR. Suspect strep or staph. Will continue clindamycin. 2. HTN--stable. Continue Norvasc, atenolol. 3. Atrial fibrillation--stable. Continue Xarelto 4. S/p tissue AVR 5. GERD--PPI. 6. Pending outpatient nuclear stress 9/25. Will notify Dr. Elease Hashimoto of admission. No recent chest pain.  Code Status: DNR per patient (family concurs--son) Family Communication: discussed with son and family at bedside Disposition Plan: home  when improved, anticipate 2-3 day stay  Time spent: 50 minutes  Brendia Sacks, MD  [facility] Team 6 Pager [phone]. If 8PM-8AM, please contact night-coverage at [email or web address], password [facility] 01/01/2012, 5:30 PM

## 2012-01-01 NOTE — ED Notes (Signed)
Pt states that she dropped a mug on her left foot on 9/13 and developed a large hematoma.  The pt is on blood thinners and says that her left foot is a "drop foot" due to a staph infection 30 years ago so she was concerned when a black spot developed and went to the doctor.  She was placed on abx but it continued to worsen so she went back to the doctor where the wound was opened and cultured on Thursday with results still pending.  Yesterday she went to back to the doctor and was placed on cipro.  A black square was drawn around the redden area on her foot and if the redness was to spread she was to come to the ED.  The redness has grown past the black line on the top of her foot and is making its way around the ankle.  There is also noted bruising and redness extending past the line up on her toes.  Pedal pulse palpated but pt denies any sensation in foot.

## 2012-01-01 NOTE — ED Provider Notes (Signed)
History     CSN: [id number]  Arrival date & time 01/01/12  1401   First MD Initiated Contact with Patient 01/01/12 1557      Chief Complaint  Patient presents with  . Wound Infection    (Consider location/radiation/quality/duration/timing/severity/associated sxs/prior treatment) The history is provided by the patient.  Brittany [name] is a 78 y.o. female hx of HL, HTN, Afib on xarelto here with L foot pain and cellulitis. About a week ago, she dropped a large coffee mug on her L foot. She then developed a hematoma. Subsequently, she noticed some cellulitis. She was placed on doxycycline for a week and then placed on cipro since yesterday. The cellulitis got worse. No fever. Sent in by PMD for possible IV abx. She had a hx of severe staph infection in that leg previously.    Past Medical History  Diagnosis Date  . Aortic stenosis     s/p tissue AVR '05  . Dyslipidemia   . Hypertension   . Dizziness   . History of aortic stenosis   . GERD (gastroesophageal reflux disease)   . Hyperplastic colon polyp   . Hiatal hernia   . Arthritis   . Depression   . Urine incontinence   . Atrial fibrillation   . Diverticulosis   . Hx of gallstones   . Stroke   . IC (interstitial cystitis)     Past Surgical History  Procedure Date  . Abdominal hysterectomy 1967  . Aortic valve replacement     2005 s/p [facility] pericardial tissue valve  . Back surgery   . Appendectom;y 9562  . Cholecystectomy 1993    Family History  Problem Relation Age of Onset  . Heart disease Father   . Coronary artery disease Brother   . Bipolar disorder Brother     commited suiside at 50yo  . Alcohol abuse Sister     sister #1  . Alcohol abuse Sister     sister #2  . Colon cancer Maternal Aunt     History  Substance Use Topics  . Smoking status: Never Smoker   . Smokeless tobacco: Never Used  . Alcohol Use: No    OB History    Grav Para Term Preterm Abortions TAB SAB Ect Mult  Living                  Review of Systems  Skin: Positive for rash and wound.  All other systems reviewed and are negative.    Allergies  Amoxicillin; Cephalexin; Codeine; Lisinopril; Morphine and related; Neurontin; Nitrofurantoin monohyd macro; Prednisone; and Sulfa antibiotics  Home Medications   Current Outpatient Rx  Name Route Sig Dispense Refill  . AMLODIPINE BESYLATE 10 MG PO TABS Oral Take 5 mg by mouth daily. 1/2 tablet every day    . ASPIRIN EC 81 MG PO TBEC Oral Take 81 mg by mouth daily.    . ATENOLOL 50 MG PO TABS Oral Take 50 mg by mouth daily.    Marland Kitchen VITAMIN D 1000 UNITS PO TABS Oral Take 1,000 Units by mouth daily.    Marland Kitchen DIAZEPAM 5 MG PO TABS Oral Take 5 mg by mouth every 8 (eight) hours as needed. For spasms    . FISH OIL PO Oral Take 1 capsule by mouth daily.     Marland Kitchen OMEPRAZOLE 20 MG PO CPDR Oral Take 20 mg by mouth 2 (two) times daily.     . OXYBUTYNIN CHLORIDE 5 MG PO TABS Oral  Take 5 mg by mouth 3 (three) times daily.    Marland Kitchen RIVAROXABAN 20 MG PO TABS Oral Take 1 tablet (20 mg total) by mouth daily. 30 tablet 6  . SIMVASTATIN 20 MG PO TABS Oral Take 10 mg by mouth daily. 1/2 tablet everyday      BP 139/78  Pulse 89  Temp 98.3 F (36.8 C) (Oral)  Resp 23  SpO2 97%  Physical Exam  Nursing note and vitals reviewed. Constitutional: She is oriented to person, place, and time. She appears well-developed and well-nourished.       NAD  HENT:  Head: Normocephalic.  Mouth/Throat: Oropharynx is clear and moist.  Eyes: Conjunctivae normal are normal. Pupils are equal, round, and reactive to light.  Neck: Normal range of motion. Neck supple.  Cardiovascular: Normal rate, regular rhythm and normal heart sounds.   Pulmonary/Chest: Effort normal and breath sounds normal.  Abdominal: Soft. Bowel sounds are normal.  Musculoskeletal: Normal range of motion.       L foot with punctate wound around 2nd metatarsal. There is surrounding cellulitis covering the dorsum of the  foot until the ankle. No purulent drainage. No subcutaneous gas.   Neurological: She is alert and oriented to person, place, and time.  Skin: Skin is warm and dry.  Psychiatric: She has a normal mood and affect. Her behavior is normal. Judgment and thought content normal.    ED Course  Procedures (including critical care time)  Labs Reviewed  CBC WITH DIFFERENTIAL - Abnormal; Notable for the following:    WBC 14.5 (*)     Neutro Abs 9.4 (*)     Monocytes Absolute 1.7 (*)     All other components within normal limits  BASIC METABOLIC PANEL - Abnormal; Notable for the following:    Sodium 134 (*)     Glucose, Bld 113 (*)     GFR calc non Af Amer 80 (*)     All other components within normal limits  PROTIME-INR   Dg Foot Complete Left  01/01/2012  *RADIOLOGY REPORT*  Clinical Data: Injury  LEFT FOOT - COMPLETE 3+ VIEW  Comparison: 02/03/2007  Findings: Three views of the left foot submitted.  Stable prior fusion of the phalanges third and fourth toe.  Again noted resection of distal aspect proximal phalanx fifth toe.  Diffuse osteopenia.  No acute fracture or subluxation.  IMPRESSION: No acute fracture or subluxation.  Diffuse osteopenia.  Stable postoperative changes as described above.   Original Report Authenticated By: Natasha Mead, M.D.      1. Cellulitis of foot       MDM  Brittany [name] is a 78 y.o. female hx of staph infection here with L foot cellulitis. Given that she is not better with PO abx, will require IV abx. Since she is allergic to PCN and sulfa, will give clindamycin IV.   5:38 PM Patient's labs showed WBC 15, CMP unremarkable, xray foot showed no fracture. Will admit for IV abx.        Richardean Canal, MD 01/01/12 1739

## 2012-01-01 NOTE — ED Notes (Signed)
Pt states that Friday the 13th, she dropped a coffee mug on her left foot and had a large hematoma initially (pt on blood thinners).  Pt states that her foot developed a black spot and she went to the doctor.  The doctor told her it was infected and put her on abx.  It continued to get worse so she returned to the doctor, they opened it, cultured it, and she has yet to hear back.  Redness continued to spread to ankle so pt went back to the doctor where she was put on cipro yesterday (has had 3 doses).  Doctor told her to come to the ER today if it didn't get any better.

## 2012-01-02 ENCOUNTER — Encounter (HOSPITAL_COMMUNITY): Payer: Self-pay

## 2012-01-02 ENCOUNTER — Telehealth: Payer: Self-pay | Admitting: Cardiovascular Disease

## 2012-01-02 LAB — BASIC METABOLIC PANEL
BUN: 9 mg/dL (ref 6–23)
Calcium: 8.4 mg/dL (ref 8.4–10.5)
Creatinine, Ser: 0.54 mg/dL (ref 0.50–1.10)
GFR calc non Af Amer: 86 mL/min — ABNORMAL LOW (ref >90)
Glucose, Bld: 102 mg/dL — ABNORMAL HIGH (ref 70–99)
Sodium: 138 mEq/L (ref 135–145)

## 2012-01-02 LAB — CBC
HCT: 34.4 % — ABNORMAL LOW (ref 36.0–46.0)
Hemoglobin: 11.3 g/dL — ABNORMAL LOW (ref 12.0–15.0)
MCH: 28.5 pg (ref 26.0–34.0)
MCHC: 32.8 g/dL (ref 30.0–36.0)
MCV: 86.6 fL (ref 78.0–100.0)

## 2012-01-02 MED ORDER — TRAMADOL HCL 50 MG PO TABS
50.0000 mg | ORAL_TABLET | Freq: Four times a day (QID) | ORAL | Status: DC | PRN
  Administered 2012-01-02 – 2012-01-03 (×3): 50 mg via ORAL
  Filled 2012-01-02: qty 1
  Filled 2012-01-02: qty 2
  Filled 2012-01-02 (×2): qty 1

## 2012-01-02 NOTE — Telephone Encounter (Signed)
noted 

## 2012-01-02 NOTE — Progress Notes (Signed)
[facility] PROGRESS NOTE  Brittany [name] EAV:[id number] DOB: 23-Sep-1927 DOA: 01/01/2012 PCP: Lupita Raider, MD  Assessment/Plan: 1. LLE cellulitis--improving, continue clindamycin. Post-traumatic. Failed outpatient antibiotics (doxycycline).  2. HTN--stable. Continue Norvasc, atenolol. 3. Atrial fibrillation--stable. Continue Xarelto 4. S/p tissue AVR 5. GERD--PPI. 6. Pending outpatient nuclear stress 9/25. Will notify Dr. Elease Hashimoto of admission. No recent chest pain.  Code Status: DNR  Family Communication: discussed with son at bedside Disposition Plan: home when improved, anticipate 1-2 days  Brendia Sacks, MD  [facility] Team 6 Pager [phone]. If 8PM-8AM, please contact night-coverage at [email or web address], password [facility] 01/02/2012, 5:38 PM  LOS: 1 day   Brief narrative: 78 year old woman presented to ED with erythema and pain in foot. 9/13 dropped coffee mug on foot, developed bruise and hematoma (on Xarelto) but no break in skin. Top of hematoma turned red, saw PA [facility] 9/[facility]--dx hematoma, rx rest, elevation, ice. Foot worsened and was seen again at [facility] 9/23 and started on doxycycline (was compliant). Failed to improve, saw PCP (actually Dr. Clovis Riley 9/26--I&D was performed (no pus) and culture sent. Saw PCP 9/21 for continued worsening, changed to Cipro and border was drawn; erythema continued to extend and patient presented today with cellulitis of dorsum of foot.  Consultants:  none  Procedures:  none  Antibiotics:  Clindamycin 9/22>>  HPI/Subjective: Feels ok, minimal pain left foot, no other complaints.  Objective: Filed Vitals:   01/01/12 1935 01/01/12 2200 01/02/12 0600 01/02/12 1341  BP:  109/69 102/65 113/60  Pulse:  74 77 79  Temp:  97.5 F (36.4 C) 97.7 F (36.5 C) 98 F (36.7 C)  TempSrc:  Oral Oral Oral  Resp:  20 18 20   Height: 5\' 1"  (1.549 m)     Weight: 65.59 kg (144 lb 9.6 oz)     SpO2:  90% 91% 95%    Intake/Output  Summary (Last 24 hours) at 01/02/12 1738 Last data filed at 01/02/12 1541  Gross per 24 hour  Intake    240 ml  Output   2400 ml  Net  -2160 ml   Filed Weights   01/01/12 1935  Weight: 65.59 kg (144 lb 9.6 oz)    Exam: General:  Appears calm and comfortable Cardiovascular: RRR, no m/r/g. No LE edema. Respiratory: CTA bilaterally, no w/r/r. Normal respiratory effort. Skin: wrinkling of dorsum of foot, decreased erythema dorsum; plantar surface and toes appear unremarkable Psychiatric: grossly normal mood and affect, speech fluent and appropriate  Data Reviewed: Basic Metabolic Panel:  Lab 01/02/12 1308 01/01/12 1606  NA 138 134*  K 3.6 4.0  CL 105 99  CO2 24 24  GLUCOSE 102* 113*  BUN 9 17  CREATININE 0.54 0.66  CALCIUM 8.4 9.8  MG -- --  PHOS -- --   CBC:  Lab 01/02/12 0454 01/01/12 1606  WBC 9.0 14.5*  NEUTROABS -- 9.4*  HGB 11.3* 13.4  HCT 34.4* 39.6  MCV 86.6 85.9  PLT 252 335   Studies: Dg Foot Complete Left  01/01/2012  *RADIOLOGY REPORT*  Clinical Data: Injury  LEFT FOOT - COMPLETE 3+ VIEW  Comparison: 02/03/2007  Findings: Three views of the left foot submitted.  Stable prior fusion of the phalanges third and fourth toe.  Again noted resection of distal aspect proximal phalanx fifth toe.  Diffuse osteopenia.  No acute fracture or subluxation.  IMPRESSION: No acute fracture or subluxation.  Diffuse osteopenia.  Stable postoperative changes as described above.   Original Report Authenticated By: Natasha Mead, M.D.  Scheduled Meds:   . sodium chloride   Intravenous STAT  . amLODipine  5 mg Oral Daily  . aspirin EC  81 mg Oral Daily  . atenolol  50 mg Oral Daily  . clindamycin (CLEOCIN) IV  600 mg Intravenous Q6H  . oxybutynin  5 mg Oral TID  . pantoprazole  40 mg Oral BID AC  . Rivaroxaban  20 mg Oral Daily  . saccharomyces boulardii  250 mg Oral BID  . simvastatin  10 mg Oral q1800  . sodium chloride  3 mL Intravenous Q12H   Continuous Infusions:    Principal Problem:  *Cellulitis of foot Active Problems:  Hypertension  Atrial fibrillation  Esophageal reflux     Brendia Sacks, MD  [facility] Team 6 Pager [phone]. If 8PM-8AM, please contact night-coverage at [email or web address], password [facility] 01/02/2012, 5:38 PM  LOS: 1 day   Time spent: 15 minutes

## 2012-01-03 MED ORDER — MUPIROCIN CALCIUM 2 % EX CREA
TOPICAL_CREAM | Freq: Two times a day (BID) | CUTANEOUS | Status: DC
  Administered 2012-01-04 – 2012-01-05 (×2): via TOPICAL
  Filled 2012-01-03: qty 15

## 2012-01-03 MED ORDER — LIDOCAINE HCL 1 % IJ SOLN
10.0000 mL | Freq: Once | INTRAMUSCULAR | Status: DC
  Filled 2012-01-03: qty 20

## 2012-01-03 NOTE — Consult Note (Signed)
Reason for Consult: Abscess dorsum left foot Referring Physician: [name] is an 78 y.o. female.  HPI: Patient is an 78 year old woman status post blunt trauma to her left foot. She is on Zaroxolyn and developed a large hematoma. This subsequently has developed infection and she presents at this time on IV antibiotics for the infection for irrigation debridement of the abscess dorsum of the left foot  Past Medical History  Diagnosis Date  . Aortic stenosis     s/p tissue AVR '05  . Dyslipidemia   . Hypertension   . Dizziness   . History of aortic stenosis   . GERD (gastroesophageal reflux disease)   . Hyperplastic colon polyp   . Hiatal hernia   . Arthritis   . Depression   . Urine incontinence   . Atrial fibrillation   . Diverticulosis   . Hx of gallstones   . Stroke   . IC (interstitial cystitis)     Past Surgical History  Procedure Date  . Abdominal hysterectomy 1967  . Aortic valve replacement     2005 s/p [facility] pericardial tissue valve  . Back surgery   . Appendectom;y 9604  . Cholecystectomy 1993    Family History  Problem Relation Age of Onset  . Heart disease Father   . Coronary artery disease Brother   . Bipolar disorder Brother     commited suiside at 56yo  . Alcohol abuse Sister     sister #1  . Alcohol abuse Sister     sister #2  . Colon cancer Maternal Aunt     Social History:  reports that she has never smoked. She has never used smokeless tobacco. She reports that she does not drink alcohol or use illicit drugs.  Allergies:  Allergies  Allergen Reactions  . Amoxicillin Diarrhea  . Cephalexin Diarrhea  . Codeine Nausea Only  . Lisinopril Cough  . 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 Neurontin (Gabapentin)     Legs swell  . Nitrofurantoin Monohyd Macro Nausea Only  . Prednisone Nausea Only  . Sulfa Antibiotics     Medications: I  have reviewed the patient's current medications.  Results for orders placed during the hospital encounter of 01/01/12 (from the past 48 hour(s))  CBC     Status: Abnormal   Collection Time   01/02/12  4:54 AM      Component Value Range Comment   WBC 9.0  4.0 - 10.5 K/uL    RBC 3.97  3.87 - 5.11 MIL/uL    Hemoglobin 11.3 (*) 12.0 - 15.0 g/dL    HCT 54.0 (*) 98.1 - 46.0 %    MCV 86.6  78.0 - 100.0 fL    MCH 28.5  26.0 - 34.0 pg    MCHC 32.8  30.0 - 36.0 g/dL    RDW 19.1  47.8 - 29.5 %    Platelets 252  150 - 400 K/uL   BASIC METABOLIC PANEL     Status: Abnormal   Collection Time   01/02/12  4:54 AM      Component Value Range Comment   Sodium 138  135 - 145 mEq/L    Potassium 3.6  3.5 - 5.1 mEq/L    Chloride 105  96 - 112 mEq/L    CO2 24  19 - 32 mEq/L    Glucose, Bld 102 (*) 70 - 99  mg/dL    BUN 9  6 - 23 mg/dL    Creatinine, Ser 4.69  0.50 - 1.10 mg/dL    Calcium 8.4  8.4 - 62.9 mg/dL    GFR calc non Af Amer 86 (*) >90 mL/min    GFR calc Af Amer >90  >90 mL/min     No results found.  Review of Systems  All other systems reviewed and are negative.   Blood pressure 95/60, pulse 74, temperature 98 F (36.7 C), temperature source Oral, resp. rate 20, height 5\' 1"  (1.549 m), weight 65.59 kg (144 lb 9.6 oz), SpO2 95.00%. Physical Exam On examination patient has a good dorsalis pedis pulse. She has multiple areas of ecchymosis and bruising on her foot. She has an abscess on the dorsum of the left foot with cellulitis and a pinpoint wound in the mid aspect of the abscess. Wound abscess measures approximately 3 cm in diameter. Assessment/Plan: Assessment: Infected hematoma dorsum of the left foot.  Plan after informed consent and sterile prepping patient underwent local anesthesia with 10 cc of 2% lidocaine plain after adequate levels of anesthesia were obtained a longitudinal incision was made over the abscess. A dark gelatinous hematoma was expressed this was cleansed and debrided.  There was no purulent abscess. Excisional debridement of soft tissue. The wound was packed open with gauze wrapped with a sterile dressing. Patient will start dial soap cleansing tomorrow followed by a Bactroban dressing change. Okay for discharge to home with wound care including daily dial  soap cleansing and Bactroban dressing changes with by mouth antibiotics at discharge I will followup in the office in one week.  DUDA,MARCUS V 01/03/2012, 6:21 PM

## 2012-01-03 NOTE — Progress Notes (Signed)
[facility] PROGRESS NOTE  Brittany [name] ZOX:[id number] DOB: 04/08/1928 DOA: 01/01/2012 PCP: Lupita Raider, MD Orthopedist: Mckinley Jewel, MD  Assessment/Plan: 1. LLE cellulitis--slowly improving, now with apparent superficial abscess. Continue clindamycin. Orthopedic consult. Post-traumatic. Failed outpatient antibiotics (doxycycline).  2. HTN--stable. Hold Norvasc, atenolol, liberalize diet, no evidence to suggest sepsis. 3. Atrial fibrillation--stable. Continue Xarelto. 4. S/p tissue AVR 5. GERD--PPI. 6. Pending outpatient nuclear stress 9/25. Dr. Elease Hashimoto notified of admission. No recent chest pain.  Code Status: DNR  Family Communication: discussed with son at bedside 9/24 Disposition Plan: home when improved  Brendia Sacks, MD  [facility] Team 6 Pager [phone]. If 8PM-8AM, please contact night-coverage at [email or web address], password [facility] 01/03/2012, 11:43 AM  LOS: 2 days   Brief narrative: 78 year old woman presented to ED with erythema and pain in foot. 9/13 dropped coffee mug on foot, developed bruise and hematoma (on Xarelto) but no break in skin. Top of hematoma turned red, saw PA at [facility] 9/19--dx hematoma, rx rest, elevation, ice. Foot worsened and was seen again at [facility] 9/23 and started on doxycycline (was compliant). Failed to improve, saw PCP (actually Dr. Clovis Riley 9/26--I&D was performed (no pus) and culture sent. Saw PCP 9/21 for continued worsening, changed to Cipro and border was drawn; erythema continued to extend and patient presented today with cellulitis of dorsum of foot.  Consultants:  none  Procedures:  none  Antibiotics:  Clindamycin 9/22>>  HPI/Subjective: Eating ok, but requests regular diet. Foot pain unchanged, now developing swelling on dorsum of foot.  Objective: Filed Vitals:   01/02/12 2200 01/03/12 0600 01/03/12 0931 01/03/12 0935  BP: 100/60 97/58 95/59  93/47  Pulse: 65 70    Temp: 98.8 F (37.1 C) 98.7 F (37.1 C)     TempSrc: Oral Oral    Resp: 18 18    Height:      Weight:      SpO2: 93% 94%      Intake/Output Summary (Last 24 hours) at 01/03/12 1143 Last data filed at 01/03/12 0849  Gross per 24 hour  Intake    580 ml  Output   1150 ml  Net   -570 ml   Filed Weights   01/01/12 1935  Weight: 65.59 kg (144 lb 9.6 oz)    Exam: General:  Appears calm and comfortable Cardiovascular: RRR, no m/r/g. No LE edema. Respiratory: CTA bilaterally, no w/r/r. Normal respiratory effort. Skin: wrinkling of dorsum of foot, decreased erythema dorsum medially; laterally erythema unchanged; fluctuance now noted dorsum of foot in place of recent I/D. Plantar surface and toes appear unremarkable. Psychiatric: grossly normal mood and affect, speech fluent and appropriate  Data Reviewed: Basic Metabolic Panel:  Lab 01/02/12 8295 01/01/12 1606  NA 138 134*  K 3.6 4.0  CL 105 99  CO2 24 24  GLUCOSE 102* 113*  BUN 9 17  CREATININE 0.54 0.66  CALCIUM 8.4 9.8  MG -- --  PHOS -- --   CBC:  Lab 01/02/12 0454 01/01/12 1606  WBC 9.0 14.5*  NEUTROABS -- 9.4*  HGB 11.3* 13.4  HCT 34.4* 39.6  MCV 86.6 85.9  PLT 252 335   Studies: Dg Foot Complete Left  01/01/2012  *RADIOLOGY REPORT*  Clinical Data: Injury  LEFT FOOT - COMPLETE 3+ VIEW  Comparison: 02/03/2007  Findings: Three views of the left foot submitted.  Stable prior fusion of the phalanges third and fourth toe.  Again noted resection of distal aspect proximal phalanx fifth toe.  Diffuse osteopenia.  No acute fracture  or subluxation.  IMPRESSION: No acute fracture or subluxation.  Diffuse osteopenia.  Stable postoperative changes as described above.   Original Report Authenticated By: Natasha Mead, M.D.     Scheduled Meds:    . amLODipine  5 mg Oral Daily  . aspirin EC  81 mg Oral Daily  . atenolol  50 mg Oral Daily  . clindamycin (CLEOCIN) IV  600 mg Intravenous Q6H  . oxybutynin  5 mg Oral TID  . pantoprazole  40 mg Oral BID AC  .  Rivaroxaban  20 mg Oral Daily  . saccharomyces boulardii  250 mg Oral BID  . simvastatin  10 mg Oral q1800  . sodium chloride  3 mL Intravenous Q12H  . DISCONTD: sodium chloride   Intravenous STAT   Continuous Infusions:   Principal Problem:  *Cellulitis of foot Active Problems:  Hypertension  Atrial fibrillation  Esophageal reflux     Brendia Sacks, MD  [facility] Team 6 Pager [phone]. If 8PM-8AM, please contact night-coverage at [email or web address], password [facility] 01/03/2012, 11:43 AM  LOS: 2 days   Time spent: 25 minutes

## 2012-01-04 ENCOUNTER — Encounter (HOSPITAL_COMMUNITY): Payer: Medicare Other

## 2012-01-04 MED ORDER — TRAMADOL HCL 50 MG PO TABS
100.0000 mg | ORAL_TABLET | Freq: Once | ORAL | Status: AC
  Administered 2012-01-04: 100 mg via ORAL

## 2012-01-04 MED ORDER — TRAMADOL HCL 50 MG PO TABS
100.0000 mg | ORAL_TABLET | Freq: Four times a day (QID) | ORAL | Status: DC | PRN
  Administered 2012-01-04 – 2012-01-05 (×4): 100 mg via ORAL
  Filled 2012-01-04: qty 2
  Filled 2012-01-04: qty 1
  Filled 2012-01-04 (×2): qty 2

## 2012-01-04 NOTE — Evaluation (Signed)
One Time Physical Therapy Evaluation Patient Details Name: Brittany [name] MRN: [id number] DOB: [birth year] Today's Date: 01/04/2012 Time: 4098-1191 PT Time Calculation (min): 28 min  PT Assessment / Plan / Recommendation Clinical Impression  pt doing well today, pain improving, mild nausea; no further needs this venue but will benefit from HHPT safety eval, facilitate pt progressing  back to amb with cane    PT Assessment  Patent does not need any further PT services    Follow Up Recommendations  Home health PT    Barriers to Discharge        Equipment Recommendations  Rolling walker with 5" wheels    Recommendations for Other Services     Frequency      Precautions / Restrictions Precautions Precautions: None Required Braces or Orthoses: Other Brace/Splint Other Brace/Splint: pt normally wears AFO on left foot, drop foot due to back surgery many years ago; discussed with pt to avoid use of shoe until MD allows/foot heals. Restrictions Weight Bearing Restrictions: No   Pertinent Vitals/Pain       Mobility  Bed Mobility Bed Mobility: Supine to Sit Supine to Sit: 7: Independent Transfers Transfers: Sit to Stand;Stand to Sit Sit to Stand: 5: Supervision;From bed Stand to Sit: 5: Supervision;To chair/3-in-1 Details for Transfer Assistance: cues for safety and hand placement Ambulation/Gait Ambulation/Gait Assistance: 5: Supervision Ambulation Distance (Feet): 120 Feet Assistive device: Rolling walker Ambulation/Gait Assistance Details: cues for sequence  Gait Pattern: Step-to pattern;Left steppage General Gait Details: pt compensates with steppage gait due to foot drop (from back surgery yrs ago--uanble to wear AFO) Stairs: No (RAMP at home)    Shoulder Instructions     Exercises     PT Diagnosis:    PT Problem List:   PT Treatment Interventions:     PT Goals    Visit Information  Last PT Received On: 01/04/12 Assistance Needed: +1    Subjective Data  Subjective: my machine is beeping Patient Stated Goal: independent, go home   Prior Functioning  Home Living Lives With: Family;Son (and dtr in law live with pt) Type of Home: House Home Access: Ramped entrance Home Layout: Able to live on main level with bedroom/bathroom (3 levels) Home Adaptive Equipment: Walker - rolling Additional Comments: walker belonged to pt husband, may be too tall Prior Function Level of Independence: Independent Able to Take Stairs?: Yes Driving: Yes Communication Communication: No difficulties    Cognition  Overall Cognitive Status: Appears within functional limits for tasks assessed/performed Arousal/Alertness: Awake/alert Orientation Level: Appears intact for tasks assessed Behavior During Session: [facility] for tasks performed    Extremity/Trunk Assessment Right Upper Extremity Assessment RUE ROM/Strength/Tone: [facility] for tasks assessed Left Upper Extremity Assessment LUE ROM/Strength/Tone: WFL for tasks assessed Right Lower Extremity Assessment RLE ROM/Strength/Tone: [facility] for tasks assessed Left Lower Extremity Assessment LLE ROM/Strength/Tone: Deficits LLE ROM/Strength/Tone Deficits: 0/5 df left ankle due to foot drop from old back surgery; PROM WFL   Balance    End of Session PT - End of Session Activity Tolerance: Patient tolerated treatment well Nurse Communication: Mobility status;Other (comment) (nausea)  GP     [facility] 01/04/2012, 11:48 AM

## 2012-01-04 NOTE — Progress Notes (Signed)
[facility] PROGRESS NOTE  Brittany [name] AVW:[id number] DOB: 1928/02/13 DOA: 01/01/2012 PCP: Lupita Raider, MD Orthopedist: Mckinley Jewel, MD  Assessment/Plan: 1. LLE cellulitis--slowly improving, Continue clindamycin. S/p I and D yesterday, no abscess, hematoma. 2. HTN--stable. Hold Norvasc, atenolol, liberalize diet, no evidence to suggest sepsis. 3. Atrial fibrillation--stable. Continue Xarelto. 4. S/p tissue AVR 5. GERD--PPI. 6. Disposition-- Get PT to assess. Patient is hesitant about DC home as she feels unsteady.   Code Status: DNR  Family Communication: discussed with son at bedside 9/24 Disposition Plan: home when improved  Peggye Pitt, MD [facility] Pager: [phone]  If 8PM-8AM, please contact night-coverage at [email or web address], password [facility] 01/04/2012, 10:38 AM  LOS: 3 days   Brief narrative: 78 year old woman presented to ED with erythema and pain in foot. 9/13 dropped coffee mug on foot, developed bruise and hematoma (on Xarelto) but no break in skin. Top of hematoma turned red, saw PA at [facility] 9/19--dx hematoma, rx rest, elevation, ice. Foot worsened and was seen again at [facility] 9/23 and started on doxycycline (was compliant). Failed to improve, saw PCP (actually Dr. Clovis Riley 9/26--I&D was performed (no pus) and culture sent. Saw PCP 9/21 for continued worsening, changed to Cipro and border was drawn; erythema continued to extend and patient presented today with cellulitis of dorsum of foot.  Consultants:  none  Procedures:  none  Antibiotics:  Clindamycin 9/22>>  HPI/Subjective: Eating ok, but requests regular diet. Foot pain unchanged, now developing swelling on dorsum of foot.  Objective: Filed Vitals:   01/03/12 1430 01/03/12 2200 01/04/12 0610 01/04/12 0915  BP: 95/60 109/71 103/64   Pulse: 74 80 68   Temp: 98 F (36.7 C) 98.6 F (37 C) 98.7 F (37.1 C) 98.1 F (36.7 C)  TempSrc: Oral Oral Oral Oral  Resp: 20 20 18      Height:      Weight:      SpO2: 95% 91% 90%     Intake/Output Summary (Last 24 hours) at 01/04/12 1038 Last data filed at 01/04/12 1014  Gross per 24 hour  Intake      0 ml  Output   1150 ml  Net  -1150 ml   Filed Weights   01/01/12 1935  Weight: 65.59 kg (144 lb 9.6 oz)    Exam: General:  Appears calm and comfortable Cardiovascular: RRR, no m/r/g. No LE edema. Respiratory: CTA bilaterally, no w/r/r. Normal respiratory effort. Skin: clean dressing and ACE bandage in place. Psychiatric: grossly normal mood and affect, speech fluent and appropriate  Data Reviewed: Basic Metabolic Panel:  Lab 01/02/12 3086 01/01/12 1606  NA 138 134*  K 3.6 4.0  CL 105 99  CO2 24 24  GLUCOSE 102* 113*  BUN 9 17  CREATININE 0.54 0.66  CALCIUM 8.4 9.8  MG -- --  PHOS -- --   CBC:  Lab 01/02/12 0454 01/01/12 1606  WBC 9.0 14.5*  NEUTROABS -- 9.4*  HGB 11.3* 13.4  HCT 34.4* 39.6  MCV 86.6 85.9  PLT 252 335   Studies: No results found.  Scheduled Meds:    . aspirin EC  81 mg Oral Daily  . clindamycin (CLEOCIN) IV  600 mg Intravenous Q6H  . lidocaine  10 mL Intradermal Once  . mupirocin cream   Topical BID  . oxybutynin  5 mg Oral TID  . pantoprazole  40 mg Oral BID AC  . Rivaroxaban  20 mg Oral Daily  . saccharomyces boulardii  250 mg Oral BID  . simvastatin  10 mg Oral q1800  . sodium chloride  3 mL Intravenous Q12H  . traMADol  100 mg Oral Once  . DISCONTD: amLODipine  5 mg Oral Daily  . DISCONTD: atenolol  50 mg Oral Daily   Continuous Infusions:   Principal Problem:  *Cellulitis of foot Active Problems:  Hypertension  Atrial fibrillation  Esophageal reflux    Peggye Pitt, MD [facility] Pager: [phone]   01/04/2012, 10:38 AM  LOS: 3 days   Time spent: 25 minutes

## 2012-01-05 MED ORDER — TRAMADOL HCL 50 MG PO TABS: 100.0000 mg | ORAL_TABLET | Freq: Four times a day (QID) | ORAL | Status: DC | PRN

## 2012-01-05 MED ORDER — CLINDAMYCIN HCL 300 MG PO CAPS: 600.0000 mg | ORAL_CAPSULE | Freq: Three times a day (TID) | ORAL | Status: AC

## 2012-01-05 NOTE — Discharge Summary (Signed)
Physician Discharge Summary  Patient ID: Brittany [name] MRN: [id number] DOB/AGE: 1927-11-02 78 y.o.  Admit date: 01/01/2012 Discharge date: 01/05/2012  Primary Care Physician:  Brittany Raider, MD   Discharge Diagnoses:    Principal Problem:  *Cellulitis of foot Active Problems:  Hypertension  Atrial fibrillation  Esophageal reflux      Medication List     As of 01/05/2012  9:24 AM    TAKE these medications         amLODipine 10 MG tablet   Commonly known as: NORVASC   Take 5 mg by mouth daily. 1/2 tablet every day      aspirin EC 81 MG tablet   Take 81 mg by mouth daily.      atenolol 50 MG tablet   Commonly known as: TENORMIN   Take 50 mg by mouth daily.      cholecalciferol 1000 UNITS tablet   Commonly known as: VITAMIN D   Take 1,000 Units by mouth daily.      clindamycin 300 MG capsule   Commonly known as: CLEOCIN   Take 2 capsules (600 mg total) by mouth 3 (three) times daily.      diazepam 5 MG tablet   Commonly known as: VALIUM   Take 5 mg by mouth every 8 (eight) hours as needed. For spasms      FISH OIL PO   Take 1 capsule by mouth daily.      omeprazole 20 MG capsule   Commonly known as: PRILOSEC   Take 20 mg by mouth 2 (two) times daily.      oxybutynin 5 MG tablet   Commonly known as: DITROPAN   Take 5 mg by mouth 3 (three) times daily.      Rivaroxaban 20 MG Tabs   Commonly known as: XARELTO   Take 1 tablet (20 mg total) by mouth daily.      simvastatin 20 MG tablet   Commonly known as: ZOCOR   Take 10 mg by mouth daily. 1/2 tablet everyday      traMADol 50 MG tablet   Commonly known as: ULTRAM   Take 2 tablets (100 mg total) by mouth every 6 (six) hours as needed.          Disposition and Follow-up:  Will be discharged home today in stable and improved condition. Dr. Lajoyce [name] would like to see her in the office in 1 week. She has been advised to follow up with her PCP in 2 weeks.  Consults:  Ortho Dr. Lajoyce [name]   Significant  Diagnostic Studies:  Left Foot xray 9/22: IMPRESSION:  No acute fracture or subluxation. Diffuse osteopenia. Stable  postoperative changes as described above.   Brief H and P: For complete details please refer to admission H and P, but in brief patient is a 78 year old woman presented to ED with erythema and pain in foot. 9/13 dropped coffee mug on foot, developed bruise and hematoma (on Xarelto) but no break in skin. Top of hematoma turned red, saw PA at [facility] 9/19--dx hematoma, rx rest, elevation, ice. Foot worsened and was seen again at [facility] 9/23 and started on doxycycline (was compliant). Failed to improve, saw PCP (actually Dr. Clovis [name] 9/26--I&D was performed (no pus) and culture sent. Saw PCP 9/21 for continued worsening, changed to Cipro and border was drawn; erythema continued to extend and patient presented  with cellulitis of dorsum of foot. We were asked to admit her for further evaluation and management.  Hospital Course:  Principal Problem:  *Cellulitis of foot Active Problems:  Hypertension  Atrial fibrillation  Esophageal reflux   Left Foot Cellulitis -Continue clindamycin for an extra 5 days. -Tramadol PRN for pain. -Dressing changes as per ortho recs (had a small hematoma requiring I and D).  HTN -Stable.  Atrial Fibrillation -Rate controlled. -Anticoagulated on Xarelto.  Disposition -DC home today. -HH care has been set up.  Time spent on Discharge: Greater than 30 minutes.  SignedChaya [name] [facility] Pager: [phone] 01/05/2012, 9:24 AM

## 2012-01-05 NOTE — Progress Notes (Signed)
While removing IV, skin tear occurred on left forearm.  Wound cleansed with normal saline, steri strips applied, nonadhesive dressing applied with kerlix.  Dr. Ardyth Harps aware.

## 2012-01-05 NOTE — Progress Notes (Signed)
Patient to be discharged to home.  IV removed from left forearm.  Reviewed discharge instructions with patient.  My chart set up for patient.  Assessed for questions at this time.  No questions at this time.  Patient to be escorted off unit via wheelchair.  Walker delivered to patient's room.  Left foot dressing changed this am.  Left foot washed with dial soap and warm water, dried, and bactroban applied, dry dressing applied with ace wrap.

## 2012-01-06 DIAGNOSIS — L03119 Cellulitis of unspecified part of limb: Secondary | ICD-10-CM | POA: Diagnosis not present

## 2012-01-06 DIAGNOSIS — L02619 Cutaneous abscess of unspecified foot: Secondary | ICD-10-CM | POA: Diagnosis not present

## 2012-01-06 DIAGNOSIS — Z8739 Personal history of other diseases of the musculoskeletal system and connective tissue: Secondary | ICD-10-CM | POA: Diagnosis not present

## 2012-01-06 DIAGNOSIS — Z8673 Personal history of transient ischemic attack (TIA), and cerebral infarction without residual deficits: Secondary | ICD-10-CM | POA: Diagnosis not present

## 2012-01-06 DIAGNOSIS — I10 Essential (primary) hypertension: Secondary | ICD-10-CM | POA: Diagnosis not present

## 2012-01-06 DIAGNOSIS — Z7902 Long term (current) use of antithrombotics/antiplatelets: Secondary | ICD-10-CM | POA: Diagnosis not present

## 2012-01-06 DIAGNOSIS — I4891 Unspecified atrial fibrillation: Secondary | ICD-10-CM | POA: Diagnosis not present

## 2012-01-06 DIAGNOSIS — I359 Nonrheumatic aortic valve disorder, unspecified: Secondary | ICD-10-CM | POA: Diagnosis not present

## 2012-01-09 DIAGNOSIS — I359 Nonrheumatic aortic valve disorder, unspecified: Secondary | ICD-10-CM | POA: Diagnosis not present

## 2012-01-09 DIAGNOSIS — Z8739 Personal history of other diseases of the musculoskeletal system and connective tissue: Secondary | ICD-10-CM | POA: Diagnosis not present

## 2012-01-09 DIAGNOSIS — L03119 Cellulitis of unspecified part of limb: Secondary | ICD-10-CM | POA: Diagnosis not present

## 2012-01-09 DIAGNOSIS — I10 Essential (primary) hypertension: Secondary | ICD-10-CM | POA: Diagnosis not present

## 2012-01-09 DIAGNOSIS — I4891 Unspecified atrial fibrillation: Secondary | ICD-10-CM | POA: Diagnosis not present

## 2012-01-09 DIAGNOSIS — L02619 Cutaneous abscess of unspecified foot: Secondary | ICD-10-CM | POA: Diagnosis not present

## 2012-01-09 DIAGNOSIS — Z8673 Personal history of transient ischemic attack (TIA), and cerebral infarction without residual deficits: Secondary | ICD-10-CM | POA: Diagnosis not present

## 2012-01-11 DIAGNOSIS — Z8739 Personal history of other diseases of the musculoskeletal system and connective tissue: Secondary | ICD-10-CM | POA: Diagnosis not present

## 2012-01-11 DIAGNOSIS — I4891 Unspecified atrial fibrillation: Secondary | ICD-10-CM | POA: Diagnosis not present

## 2012-01-11 DIAGNOSIS — L02619 Cutaneous abscess of unspecified foot: Secondary | ICD-10-CM | POA: Diagnosis not present

## 2012-01-11 DIAGNOSIS — Z8673 Personal history of transient ischemic attack (TIA), and cerebral infarction without residual deficits: Secondary | ICD-10-CM | POA: Diagnosis not present

## 2012-01-11 DIAGNOSIS — I10 Essential (primary) hypertension: Secondary | ICD-10-CM | POA: Diagnosis not present

## 2012-01-11 DIAGNOSIS — I359 Nonrheumatic aortic valve disorder, unspecified: Secondary | ICD-10-CM | POA: Diagnosis not present

## 2012-01-12 DIAGNOSIS — L03119 Cellulitis of unspecified part of limb: Secondary | ICD-10-CM | POA: Diagnosis not present

## 2012-01-12 DIAGNOSIS — I10 Essential (primary) hypertension: Secondary | ICD-10-CM | POA: Diagnosis not present

## 2012-01-13 DIAGNOSIS — I359 Nonrheumatic aortic valve disorder, unspecified: Secondary | ICD-10-CM | POA: Diagnosis not present

## 2012-01-13 DIAGNOSIS — I4891 Unspecified atrial fibrillation: Secondary | ICD-10-CM | POA: Diagnosis not present

## 2012-01-13 DIAGNOSIS — Z8673 Personal history of transient ischemic attack (TIA), and cerebral infarction without residual deficits: Secondary | ICD-10-CM | POA: Diagnosis not present

## 2012-01-13 DIAGNOSIS — Z8739 Personal history of other diseases of the musculoskeletal system and connective tissue: Secondary | ICD-10-CM | POA: Diagnosis not present

## 2012-01-13 DIAGNOSIS — I10 Essential (primary) hypertension: Secondary | ICD-10-CM | POA: Diagnosis not present

## 2012-01-13 DIAGNOSIS — L02619 Cutaneous abscess of unspecified foot: Secondary | ICD-10-CM | POA: Diagnosis not present

## 2012-01-16 DIAGNOSIS — L03119 Cellulitis of unspecified part of limb: Secondary | ICD-10-CM | POA: Diagnosis not present

## 2012-01-16 DIAGNOSIS — L02619 Cutaneous abscess of unspecified foot: Secondary | ICD-10-CM | POA: Diagnosis not present

## 2012-01-23 ENCOUNTER — Telehealth: Payer: Self-pay | Admitting: Gastroenterology

## 2012-01-23 DIAGNOSIS — Z1329 Encounter for screening for other suspected endocrine disorder: Secondary | ICD-10-CM | POA: Diagnosis not present

## 2012-01-23 DIAGNOSIS — R110 Nausea: Secondary | ICD-10-CM | POA: Diagnosis not present

## 2012-01-23 NOTE — Telephone Encounter (Signed)
Pt states she is having problems with nausea and weight loss.

## 2012-01-23 NOTE — Telephone Encounter (Signed)
Pt requesting to be seen. Discussed with pt that she was seen by Dr. Arlyce Dice 12/26/11 per the office note. Pt states she has never seen Dr. Arlyce Dice, that she has never met him. Pt states she was in the hospital then, records state she was in the hospital a week later. Per OV note pt was scheduled with her cardiologist Dr. Daleen Squibb because she takes xeralto and she would need a lovenox bridge if she needs an EGD. Pt states she does not know about any of this. Pt scheduled to see Mike Gip PA tomorrow at 2:30pm. Pt aware of appt date and time. Called her PCP office to see if she had any history of dementia and there is no record in her chart.

## 2012-01-24 ENCOUNTER — Ambulatory Visit (INDEPENDENT_AMBULATORY_CARE_PROVIDER_SITE_OTHER): Payer: Medicare Other | Admitting: Physician Assistant

## 2012-01-24 ENCOUNTER — Encounter: Payer: Self-pay | Admitting: Physician Assistant

## 2012-01-24 VITALS — BP 118/68 | HR 64 | Ht 62.5 in | Wt 140.8 lb

## 2012-01-24 DIAGNOSIS — R630 Anorexia: Secondary | ICD-10-CM

## 2012-01-24 DIAGNOSIS — K219 Gastro-esophageal reflux disease without esophagitis: Secondary | ICD-10-CM | POA: Diagnosis not present

## 2012-01-24 DIAGNOSIS — R110 Nausea: Secondary | ICD-10-CM

## 2012-01-24 NOTE — Patient Instructions (Addendum)
Stay on Prilosec twice daily   Stress test with [facility] is scheduled for January 30, 2012 at 11:45 am, please arrive 15 minutes early for registration. Twelve hours before stress test have no caffeine or decaffeinated products  Four hours before stress test nothing to eat or drink [facility] will call you day before the stress test and go over medications   Please take Reglan as needed  Please call back if you need something for nausea

## 2012-01-25 ENCOUNTER — Encounter: Payer: Self-pay | Admitting: Physician Assistant

## 2012-01-25 NOTE — Progress Notes (Signed)
Subjective:    Patient ID: Brittany [name], female    DOB: [birth year], 78 y.o.   MRN: [id number]  HPI Brittany [name] is a pleasant 78 year old white female known to Dr. Arlyce Dice. She has history of aortic valve replacement and prior CVA and is currently being maintained on Xarelto. She has history of chronic GERD, hypertension, hyperplastic colon polyps, depression, and diverticular disease. She was recently seen in the office 12/26/2011 with complaints of chest pain. She had been on Prilosec once daily and this was increased to twice daily. She had complaints of heartburn but also mentions that time that she had more severe burning chest discomfort intermittently and it had an episode of more intense pain that radiated to her back jaw and her left upper extremity. She also has had some recent exertional dyspnea. He complained of dysphagia primarily just to pills. She had been seen in the past by Dr. Ewing Schlein and it had endoscopy in 2011 finding only of a  tiny hiatal hernia there was no evidence of esophageal stricture at that time. Patient decided not to go back to Dr. Ewing Schlein.because she had called one time with acute symptoms  and was unable to be seen. Dr. Arlyce Dice was concerned that she may have been having cardiac symptoms it was recommended that she see her cardiologist again and have stress testing prior to decision regarding endoscopy. After that visit she actually developed an injury to her foot after dropping a cup on her foot. Paraplegia developed a hematoma which then became infected and she wound up being admitted to the hospital 9/22 through 01/05/2012 with cellulitis. He is now being followed by Dr. Lajoyce Corners for her foot injury . She has not been back to the cardiologist, and in fact does not remember her appointment with Dr. Arlyce Dice and is insistent that she wasn't here. Today she says that her reflux symptoms are better and that she's not having any heartburn or indigestion and continues on the twice-daily  Prilosec. She and her daughter-in-law are very concerned because since she was in the hospital she has developed significant nausea and anorexia. Has not had any change in her bowel habits no melena or hematochezia and has no complaints of abdominal pain. She just has no appetite has lost weight and feels weak. She had been on a prolonged course of clindamycin which she has recently finished     Review of Systems  Constitutional: Positive for appetite change, fatigue and unexpected weight change.  HENT: Negative.   Eyes: Negative.   Respiratory: Positive for shortness of breath.   Cardiovascular: Negative.   Gastrointestinal: Positive for nausea.  Genitourinary: Negative.   Musculoskeletal: Negative.   Skin: Negative.   Neurological: Negative.   Hematological: Negative.   Psychiatric/Behavioral: Negative.    Outpatient Prescriptions Prior to Visit  Medication Sig Dispense Refill  . amLODipine (NORVASC) 10 MG tablet Take 5 mg by mouth daily. 1/2 tablet every day      . aspirin EC 81 MG tablet Take 81 mg by mouth daily.      Marland Kitchen atenolol (TENORMIN) 50 MG tablet Take 50 mg by mouth daily.      . cholecalciferol (VITAMIN D) 1000 UNITS tablet Take 1,000 Units by mouth daily.      . diazepam (VALIUM) 5 MG tablet Take 5 mg by mouth every 8 (eight) hours as needed. For spasms      . Omega-3 Fatty Acids (FISH OIL PO) Take 1 capsule by mouth daily.       Marland Kitchen  omeprazole (PRILOSEC) 20 MG capsule Take 20 mg by mouth 2 (two) times daily.       Marland Kitchen oxybutynin (DITROPAN) 5 MG tablet Take 5 mg by mouth 3 (three) times daily.      . Rivaroxaban (XARELTO) 20 MG TABS Take 1 tablet (20 mg total) by mouth daily.  30 tablet  6  . simvastatin (ZOCOR) 20 MG tablet Take 10 mg by mouth daily. 1/2 tablet everyday      . traMADol (ULTRAM) 50 MG tablet Take 2 tablets (100 mg total) by mouth every 6 (six) hours as needed.  30 tablet  0       Allergies  Allergen Reactions  . Amoxicillin Diarrhea  . Cephalexin  Diarrhea  . Codeine Nausea Only  . Lisinopril Cough  . 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 Neurontin (Gabapentin)     Legs swell  . Nitrofurantoin Monohyd Macro Nausea Only  . Prednisone Nausea Only  . Sulfa Antibiotics    Patient Active Problem List  Diagnosis  . Aortic stenosis  . Hypertension  . Chest pain  . Atrial fibrillation  . Dyspnea on exertion  . Dysphagia, unspecified  . Esophageal reflux  . Cellulitis of foot   History   Social History  . Marital Status: Widowed    Spouse Name: N/A    Number of Children: 4  . Years of Education: N/A   Occupational History  . retired    Social History Main Topics  . Smoking status: Former Games developer  . Smokeless tobacco: Never Used   Comment: Quit 40 years ago  . Alcohol Use: No  . Drug Use: No  . Sexually Active: Not on file   Other Topics Concern  . Not on file   Social History Narrative  . No narrative on file    Objective:   Physical Exam well-developed elderly white female in no acute distress, accompanied by her daughter-in-law blood pressure 118/68 pulse 64 height 5 foot 2 weight 140. HEENT; nontraumatic normocephalic EOMI PERRLA sclera anicteric,Neck; Supple no JVD, Cardiovascular; regular rate and rhythm with S1-S2 soft systolic murmur, Pulmonary; clear bilaterally, Abdomen; soft nontender nondistended bowel sounds are active there's no palpable mass or hepatosplenomegaly, Rectal; not done, Extremities ; no clubbing cyanosis or edema she has a wrap on her right foot, Psych; mood and affect normal and appropriate.        Assessment & Plan:  #24 78 year old female with history of aortic stenosis status post aortic valve replacement, also with history of atrial fibrillation with recent complaints of some exertional dyspnea. She had also had recent complaints of chest pain with some radiation to the jaw which were more concerning for  coronary ischemia then reflux. Her reflux symptoms are under better control, it is felt that she should undergo cardiac evaluation stress testing prior to consideration of any endoscopy #2 anticoagulation with Xarelto #3 new onset of nausea and anorexia-since hospitalization for cellulitis and treatment with clindamycin. I suspect her anorexia and nausea are antibiotic related and will improve with time #4 vague dysphagia to pills, cannot rule out stricture though she had no evidence of esophageal stricture 2 years ago on endoscopy. #5 history of CVA  Plan; we have rescheduled cardiac evaluation and she will have  stress testing next week then cardiology followup Continue Prilosec 20 mg by mouth twice daily She was started on metoclopramide by her primary care provider within  the past couple of days for nausea she seems to be tolerating this okay so will continue at least in the short-term. We could also try low-dose Phenergan if she has any side effects from the Reglan. She had tried Zofran low-dose and had not found as helpful I have asked the patient and her daughter-in-law to call for followup if she has persistent symptoms beyond the next couple of weeks and after she has completed her cardiac eval

## 2012-01-25 NOTE — Progress Notes (Signed)
Reviewed and agree with management. Madilyne Tadlock D. Kalaya Infantino, M.D., FACG  

## 2012-01-26 ENCOUNTER — Telehealth: Payer: Self-pay | Admitting: Physician Assistant

## 2012-01-26 MED ORDER — PROMETHAZINE HCL 25 MG PO TABS: ORAL_TABLET | ORAL | Status: DC

## 2012-01-26 NOTE — Telephone Encounter (Signed)
Spoke with patient and she states when she picks up a cup, her hand is shaking. This has started since taking the Reglan. She would like to switch to Phenergan. Please, advise.

## 2012-01-26 NOTE — Telephone Encounter (Signed)
Rx sent . Patient notified of Mike Gip, [place] recommendations.

## 2012-01-26 NOTE — Telephone Encounter (Signed)
Sure enough..stop the  Reglan. Let her try Phenergan 25 mg ,take 1/2 every 6 hours as needed for nausea # 30 one refill

## 2012-01-30 ENCOUNTER — Ambulatory Visit (HOSPITAL_COMMUNITY): Payer: Medicare Other | Attending: Internal Medicine | Admitting: Radiology

## 2012-01-30 VITALS — BP 119/72 | Ht 61.0 in | Wt 140.0 lb

## 2012-01-30 DIAGNOSIS — I4891 Unspecified atrial fibrillation: Secondary | ICD-10-CM

## 2012-01-30 DIAGNOSIS — R0602 Shortness of breath: Secondary | ICD-10-CM | POA: Diagnosis not present

## 2012-01-30 DIAGNOSIS — I4949 Other premature depolarization: Secondary | ICD-10-CM

## 2012-01-30 DIAGNOSIS — R002 Palpitations: Secondary | ICD-10-CM | POA: Insufficient documentation

## 2012-01-30 DIAGNOSIS — R0989 Other specified symptoms and signs involving the circulatory and respiratory systems: Secondary | ICD-10-CM | POA: Insufficient documentation

## 2012-01-30 DIAGNOSIS — R0609 Other forms of dyspnea: Secondary | ICD-10-CM | POA: Insufficient documentation

## 2012-01-30 DIAGNOSIS — R0789 Other chest pain: Secondary | ICD-10-CM

## 2012-01-30 DIAGNOSIS — R079 Chest pain, unspecified: Secondary | ICD-10-CM | POA: Insufficient documentation

## 2012-01-30 MED ORDER — TECHNETIUM TC 99M SESTAMIBI GENERIC - CARDIOLITE
11.0000 | Freq: Once | INTRAVENOUS | Status: AC | PRN
  Administered 2012-01-30: 11 via INTRAVENOUS

## 2012-01-30 MED ORDER — REGADENOSON 0.4 MG/5ML IV SOLN
0.4000 mg | Freq: Once | INTRAVENOUS | Status: AC
  Administered 2012-01-30: 0.4 mg via INTRAVENOUS

## 2012-01-30 MED ORDER — TECHNETIUM TC 99M SESTAMIBI GENERIC - CARDIOLITE
33.0000 | Freq: Once | INTRAVENOUS | Status: AC | PRN
  Administered 2012-01-30: 33 via INTRAVENOUS

## 2012-01-30 NOTE — Progress Notes (Signed)
[facility] SITE 3 NUCLEAR MED [address] [id number] [place] [address] [phone]  Cardiology Nuclear Med Study  Brittany [name] is a 78 y.o. female     MRN : [id number]     DOB: September 10, 1927  Procedure Date: 01/30/2012  Nuclear Med Background Indication for Stress Test:  Evaluation for Ischemia History:  AFIB, '05 AVR/Heart Cath: AS, 2/13 MPS: EF: 80% (-) ischemia, 5/13 ECHO: EF: 55-60%  Cardiac Risk Factors: CVA, History of Smoking and Lipids  Symptoms:  Chest Pain, DOE, Fatigue, Palpitations and SOB   Nuclear Pre-Procedure Caffeine/Decaff Intake:  7:00pm NPO After: 7:00am   Lungs:  clear O2 Sat: 96% on room air. IV 0.9% NS with Angio Cath:  22g  IV Site: R Wrist  IV Started by:  Cathlyn Parsons, RN  Chest Size (in):  38 Cup Size: D  Height: 5\' 1"  (1.549 m)  Weight:  140 lb (63.504 kg)  BMI:  Body mass index is 26.45 kg/(m^2). Tech Comments:  Atenolol held x 16 hrs    Nuclear Med Study 1 or 2 day study: 1 day  Stress Test Type:  Lexiscan  Reading MD: Dietrich Pates, MD  Order Authorizing Provider:  Jannette Spanner  Resting Radionuclide: Technetium 70m Sestamibi  Resting Radionuclide Dose: 11.0 mCi   Stress Radionuclide:  Technetium 10m Sestamibi  Stress Radionuclide Dose: 33.0 mCi           Stress Protocol Rest HR: 75 Stress HR: 92  Rest BP: 119/72 Stress BP: 143/81  Exercise Time (min): n/a METS: n/a   Predicted Max HR: 138 bpm % Max HR: 66.67 bpm Rate Pressure Product: 25366   Dose of Adenosine (mg):  n/a Dose of Lexiscan: 0.4 mg  Dose of Atropine (mg): n/a Dose of Dobutamine: n/a mcg/kg/min (at max HR)  Stress Test Technologist: Milana Na, EMT-P  Nuclear Technologist:  Domenic Polite, CNMT     Rest Procedure:  Myocardial perfusion imaging was performed at rest 45 minutes following the intravenous administration of Technetium 53m Sestamibi. Rest ECG: Atrial Fibrilliation  Stress Procedure:  The patient received IV  Lexiscan 0.4 mg over 15-seconds.  Technetium 61m Sestamibi injected at 30-seconds.  There were no significant changes and occ pvcs with Lexiscan.  Quantitative spect images were obtained after a 45 minute delay. Stress ECG: No significant change from baseline ECG  QPS Raw Data Images:  Images were motion corrected.  Soft tissue (diaphragm) underlies heart. Stress Images:  Normal homogeneous uptake in all areas of the myocardium. Rest Images:  Normal homogeneous uptake in all areas of the myocardium. Subtraction (SDS):  No evidence of ischemia. Transient Ischemic Dilatation (Normal <1.22):  1.05 Lung/Heart Ratio (Normal <0.45):  0.30  Quantitative Gated Spect Images QGS EDV:  57 ml QGS ESV:  16 ml  Impression Exercise Capacity:  Lexiscan with no exercise. BP Response:  Normal blood pressure response. Clinical Symptoms:  Mild chest pain/dyspnea. ECG Impression:  No significant ST segment change suggestive of ischemia. Comparison with Prior Nuclear Study: no change from previous report.  Overall Impression:  Normal stress nuclear study.  LV Ejection Fraction: 73%.  LV Wall Motion:  NL LV Function; NL Wall Motion  Dietrich Pates

## 2012-02-02 DIAGNOSIS — R310 Gross hematuria: Secondary | ICD-10-CM | POA: Diagnosis not present

## 2012-02-02 DIAGNOSIS — R634 Abnormal weight loss: Secondary | ICD-10-CM | POA: Diagnosis not present

## 2012-02-02 DIAGNOSIS — R3989 Other symptoms and signs involving the genitourinary system: Secondary | ICD-10-CM | POA: Diagnosis not present

## 2012-02-03 DIAGNOSIS — N200 Calculus of kidney: Secondary | ICD-10-CM | POA: Diagnosis not present

## 2012-02-03 DIAGNOSIS — R310 Gross hematuria: Secondary | ICD-10-CM | POA: Diagnosis not present

## 2012-02-06 DIAGNOSIS — K219 Gastro-esophageal reflux disease without esophagitis: Secondary | ICD-10-CM | POA: Diagnosis not present

## 2012-02-06 DIAGNOSIS — E782 Mixed hyperlipidemia: Secondary | ICD-10-CM | POA: Diagnosis not present

## 2012-02-06 DIAGNOSIS — Z0000 Encounter for general adult medical examination without abnormal findings: Secondary | ICD-10-CM | POA: Diagnosis not present

## 2012-02-06 DIAGNOSIS — M949 Disorder of cartilage, unspecified: Secondary | ICD-10-CM | POA: Diagnosis not present

## 2012-02-06 DIAGNOSIS — I10 Essential (primary) hypertension: Secondary | ICD-10-CM | POA: Diagnosis not present

## 2012-02-08 ENCOUNTER — Encounter: Payer: Self-pay | Admitting: Cardiology

## 2012-02-08 ENCOUNTER — Ambulatory Visit (INDEPENDENT_AMBULATORY_CARE_PROVIDER_SITE_OTHER): Payer: Medicare Other | Admitting: Cardiology

## 2012-02-08 VITALS — BP 128/77 | HR 70 | Ht 61.0 in | Wt 145.0 lb

## 2012-02-08 DIAGNOSIS — I4891 Unspecified atrial fibrillation: Secondary | ICD-10-CM

## 2012-02-08 DIAGNOSIS — I350 Nonrheumatic aortic (valve) stenosis: Secondary | ICD-10-CM

## 2012-02-08 DIAGNOSIS — I359 Nonrheumatic aortic valve disorder, unspecified: Secondary | ICD-10-CM

## 2012-02-08 NOTE — Progress Notes (Signed)
HPI Brittany [name] returns today for the evaluation and management of her paroxysmal A. fib and history of stroke. This was prior to beginning anticoagulation.  She does have some dyspnea but is much improved. She denies any palpitations. She has had spontaneous hematuria is being evaluated by urology. He was told she had a urethral diverticula. She also dropped a coffee mug on her left foot required significant intervention to have the area healed from a tremendous ecchymoses.  Her last echocardiogram was unremarkable recently. She is on aspirin and Xarelto.  Past Medical History  Diagnosis Date  . Aortic stenosis     s/p tissue AVR '05  . Dyslipidemia   . Hypertension   . Dizziness   . History of aortic stenosis   . GERD (gastroesophageal reflux disease)   . Hyperplastic colon polyp   . Hiatal hernia   . Arthritis   . Depression   . Urine incontinence   . Atrial fibrillation   . Diverticulosis   . Hx of gallstones   . Stroke   . IC (interstitial cystitis)     Current Outpatient Prescriptions  Medication Sig Dispense Refill  . amLODipine (NORVASC) 10 MG tablet Take 5 mg by mouth daily. 1/2 tablet every day      . atenolol (TENORMIN) 50 MG tablet Take 50 mg by mouth daily.      Marland Kitchen atorvastatin (LIPITOR) 20 MG tablet 1 tab a day      . cholecalciferol (VITAMIN D) 1000 UNITS tablet Take 1,000 Units by mouth daily.      . diazepam (VALIUM) 5 MG tablet Take 5 mg by mouth every 8 (eight) hours as needed. For spasms      . Omega-3 Fatty Acids (FISH OIL PO) Take 1 capsule by mouth daily.       Marland Kitchen omeprazole (PRILOSEC) 20 MG capsule Take 20 mg by mouth 2 (two) times daily.       Marland Kitchen oxybutynin (DITROPAN) 5 MG tablet Take 5 mg by mouth 3 (three) times daily.      . promethazine (PHENERGAN) 25 MG tablet Take 1/2 tablet every 6 hours prn nausea  30 tablet  1  . Rivaroxaban (XARELTO) 20 MG TABS Take 1 tablet (20 mg total) by mouth daily.  30 tablet  6  . Rivaroxaban (XARELTO) 20 MG TABS Take by  mouth daily.      . traMADol (ULTRAM) 50 MG tablet Take 2 tablets (100 mg total) by mouth every 6 (six) hours as needed.  30 tablet  0    Allergies  Allergen Reactions  . Amoxicillin Diarrhea  . Cephalexin Diarrhea  . Codeine Nausea Only  . Lisinopril Cough  . 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 Neurontin (Gabapentin)     Legs swell  . Nitrofurantoin Monohyd Macro Nausea Only  . Prednisone Nausea Only  . Sulfa Antibiotics     Family History  Problem Relation Age of Onset  . Heart disease Father   . Coronary artery disease Brother   . Bipolar disorder Brother     commited suiside at 47yo  . Alcohol abuse Sister     sister #1  . Alcohol abuse Sister     sister #2  . Colon cancer Maternal Aunt     History   Social History  . Marital Status: Widowed    Spouse Name: N/A    Number of Children: 4  .  Years of Education: N/A   Occupational History  . retired    Social History Main Topics  . Smoking status: Former Games developer  . Smokeless tobacco: Never Used   Comment: Quit 40 years ago  . Alcohol Use: No  . Drug Use: No  . Sexually Active: Not on file   Other Topics Concern  . Not on file   Social History Narrative  . No narrative on file    ROS ALL NEGATIVE EXCEPT THOSE NOTED IN HPI  PE  General Appearance: well developed, well nourished in no acute distress HEENT: symmetrical face, PERRLA, good dentition  Neck: no JVD, thyromegaly, or adenopathy, trachea midline Chest: symmetric without deformity Cardiac: PMI non-displaced, IRRR, normal S1, S2, no gallop or murmur Lung: clear to ausculation and percussion Vascular: all pulses full without bruits  Abdominal: nondistended, nontender, good bowel sounds, no HSM, no bruits Extremities: no cyanosis, clubbing or edema, no sign of DVT, no varicosities  Skin: normal color, no rashes Neuro: alert and oriented x 3, non-focal Pysch: normal  affect  EKG  BMET    Component Value Date/Time   NA 138 01/02/2012 0454   K 3.6 01/02/2012 0454   CL 105 01/02/2012 0454   CO2 24 01/02/2012 0454   GLUCOSE 102* 01/02/2012 0454   BUN 9 01/02/2012 0454   CREATININE 0.54 01/02/2012 0454   CALCIUM 8.4 01/02/2012 0454   GFRNONAA 86* 01/02/2012 0454   GFRAA >90 01/02/2012 0454    Lipid Panel  No results found for this basename: chol, trig, hdl, cholhdl, vldl, ldlcalc    CBC    Component Value Date/Time   WBC 9.0 01/02/2012 0454   RBC 3.97 01/02/2012 0454   HGB 11.3* 01/02/2012 0454   HCT 34.4* 01/02/2012 0454   PLT 252 01/02/2012 0454   MCV 86.6 01/02/2012 0454   MCH 28.5 01/02/2012 0454   MCHC 32.8 01/02/2012 0454   RDW 14.8 01/02/2012 0454   LYMPHSABS 3.1 01/01/2012 1606   MONOABS 1.7* 01/01/2012 1606   EOSABS 0.2 01/01/2012 1606   BASOSABS 0.1 01/01/2012 1606

## 2012-02-08 NOTE — Assessment & Plan Note (Signed)
I discussed if we should continue aspirin with Dr. Graciela Husbands. We decided to stop this since she is on anticoagulation and has increased bleeding risk. She has had carotid Dopplers done in the past which showed nonobstructive disease. I do not hear any bruits today. See her back in one year or when necessary.

## 2012-02-08 NOTE — Assessment & Plan Note (Signed)
Stable. Follow clinically. 

## 2012-02-08 NOTE — Patient Instructions (Signed)
You may stop taking your aspirin  Continue all of your other medications as directed.  Your physician wants you to follow-up in: 1 year with Dr. Daleen Squibb. You will receive a reminder letter in the mail two months in advance. If you don't receive a letter, please call our office to schedule the follow-up appointment.

## 2012-02-09 ENCOUNTER — Other Ambulatory Visit (HOSPITAL_COMMUNITY): Payer: Self-pay | Admitting: Internal Medicine

## 2012-02-16 ENCOUNTER — Telehealth: Payer: Self-pay | Admitting: Gastroenterology

## 2012-02-16 DIAGNOSIS — R1010 Upper abdominal pain, unspecified: Secondary | ICD-10-CM

## 2012-02-16 NOTE — Telephone Encounter (Signed)
Patient calling to report her heartburn is back. States she has had the stress test Dr. Arlyce Dice requested and she was also in the hospital for cellulitis of her foot. She was given IV and oral antibiotics which she completed 2 weeks ago.  She states she woke up on Monday with heartburn that continued until 2 PM. She also reports abdominal pain that is above her belly button. She can push on the area and it hurts. She has a gnawing feeling in her stomach. She is able to eat. She is taking Omeprazole BID and using Phenergan 25 mg 1/2 tab prn nausea which is usually 2 times/week. Please, advise.

## 2012-02-17 DIAGNOSIS — N3010 Interstitial cystitis (chronic) without hematuria: Secondary | ICD-10-CM | POA: Diagnosis not present

## 2012-02-17 NOTE — Telephone Encounter (Signed)
She needs EGD.  Can remain on xarelto

## 2012-02-17 NOTE — Telephone Encounter (Signed)
Spoke with patient and scheduled EGD on 03/15/12 at [facility] 9:30/10:30 AM. Previsit scheduled on 03/06/12 at 9:00 AM.

## 2012-02-22 DIAGNOSIS — N3010 Interstitial cystitis (chronic) without hematuria: Secondary | ICD-10-CM | POA: Diagnosis not present

## 2012-02-22 DIAGNOSIS — R3989 Other symptoms and signs involving the genitourinary system: Secondary | ICD-10-CM | POA: Diagnosis not present

## 2012-02-22 DIAGNOSIS — R310 Gross hematuria: Secondary | ICD-10-CM | POA: Diagnosis not present

## 2012-02-28 DIAGNOSIS — N3010 Interstitial cystitis (chronic) without hematuria: Secondary | ICD-10-CM | POA: Diagnosis not present

## 2012-03-05 DIAGNOSIS — N3010 Interstitial cystitis (chronic) without hematuria: Secondary | ICD-10-CM | POA: Diagnosis not present

## 2012-03-06 ENCOUNTER — Ambulatory Visit (AMBULATORY_SURGERY_CENTER): Payer: Medicare Other | Admitting: *Deleted

## 2012-03-06 VITALS — Ht 61.0 in | Wt 144.4 lb

## 2012-03-06 DIAGNOSIS — K219 Gastro-esophageal reflux disease without esophagitis: Secondary | ICD-10-CM

## 2012-03-06 DIAGNOSIS — R110 Nausea: Secondary | ICD-10-CM

## 2012-03-14 DIAGNOSIS — N3010 Interstitial cystitis (chronic) without hematuria: Secondary | ICD-10-CM | POA: Diagnosis not present

## 2012-03-15 ENCOUNTER — Encounter: Payer: Medicare Other | Admitting: Internal Medicine

## 2012-03-16 ENCOUNTER — Encounter: Payer: Self-pay | Admitting: Gastroenterology

## 2012-03-16 ENCOUNTER — Ambulatory Visit (AMBULATORY_SURGERY_CENTER): Payer: Medicare Other | Admitting: Gastroenterology

## 2012-03-16 VITALS — BP 122/70 | HR 86 | Temp 97.6°F | Resp 36 | Ht 61.0 in | Wt 144.0 lb

## 2012-03-16 DIAGNOSIS — R110 Nausea: Secondary | ICD-10-CM | POA: Diagnosis not present

## 2012-03-16 DIAGNOSIS — K319 Disease of stomach and duodenum, unspecified: Secondary | ICD-10-CM | POA: Diagnosis not present

## 2012-03-16 DIAGNOSIS — K2970 Gastritis, unspecified, without bleeding: Secondary | ICD-10-CM

## 2012-03-16 DIAGNOSIS — K219 Gastro-esophageal reflux disease without esophagitis: Secondary | ICD-10-CM

## 2012-03-16 MED ORDER — OMEPRAZOLE 20 MG PO CPDR: 40.0000 mg | DELAYED_RELEASE_CAPSULE | Freq: Every day | ORAL | Status: DC

## 2012-03-16 MED ORDER — SODIUM CHLORIDE 0.9 % IV SOLN: 500.0000 mL | INTRAVENOUS | Status: DC

## 2012-03-16 NOTE — Op Note (Signed)
[facility] [address] [place], [address]   ENDOSCOPY PROCEDURE REPORT  PATIENT: Brittany [name], Brittany [name]  MR#: [id number] BIRTHDATE: 1928-03-15 , 84  yrs. old GENDER: Female ENDOSCOPIST: Louis Meckel, MD REFERRED BY:  Kristeen Miss, M.D. PROCEDURE DATE:  03/16/2012 PROCEDURE:  EGD w/ biopsy ASA CLASS:     Class III INDICATIONS:  periumbilical abdominal pain.   Chest pain. MEDICATIONS: MAC sedation, administered by CRNA and propofol (Diprivan) 100mg  IV TOPICAL ANESTHETIC: Cetacaine Spray  DESCRIPTION OF PROCEDURE: After the risks benefits and alternatives of the procedure were thoroughly explained, informed consent was obtained.  The LB [id number] endoscope was introduced through the mouth and advanced to the third portion of the duodenum. Without limitations.  The instrument was slowly withdrawn as the mucosa was fully examined.      In the gastric antrum there was a circumferential area of pale , atrophic appearing mucosa in the prepyloric is.  In the remainder of the gastric body and fundus there was mild erythema.  Biopsies were taken.   There is moderate erythema in the distal esophagus. The remainder of the upper endoscopy exam was otherwise normal. Retroflexed views revealed no abnormalities.     The scope was then withdrawn from the patient and the procedure completed.  COMPLICATIONS: There were no complications. ENDOSCOPIC IMPRESSION: 1.   nonerosive gastritis and esophagitis    RECOMMENDATIONS: Await biopsy results OV 4-5 weeks continue omeprazole (incerease to 40mg  daily)  REPEAT EXAM:  eSigned:  Louis Meckel, MD 03/16/2012 10:17 AM   CC:  PATIENT NAME:  [name], [name] MR#: [id number]

## 2012-03-16 NOTE — Progress Notes (Signed)
Patient did not experience any of the following events: a burn prior to discharge; a fall within the facility; wrong site/side/patient/procedure/implant event; or a hospital transfer or hospital admission upon discharge from the facility. (G8907) Patient did not have preoperative order for IV antibiotic SSI prophylaxis. (G8918)  

## 2012-03-16 NOTE — Patient Instructions (Addendum)
Findings: Gastritis and esophagitis Recommendations:  Office visit in 4-5 weeks, increase omeprazole to 40 mg daily  YOU HAD AN ENDOSCOPIC PROCEDURE TODAY AT THE [facility]: Refer to the procedure report that was given to you for any specific questions about what was found during the examination.  If the procedure report does not answer your questions, please call your gastroenterologist to clarify.  If you requested that your care partner not be given the details of your procedure findings, then the procedure report has been included in a sealed envelope for you to review at your convenience later.  YOU SHOULD EXPECT: Some feelings of bloating in the abdomen. Passage of more gas than usual.  Walking can help get rid of the air that was put into your GI tract during the procedure and reduce the bloating. If you had a lower endoscopy (such as a colonoscopy or flexible sigmoidoscopy) you may notice spotting of blood in your stool or on the toilet paper. If you underwent a bowel prep for your procedure, then you may not have a normal bowel movement for a few days.  DIET: Your first meal following the procedure should be a light meal and then it is ok to progress to your normal diet.  A half-sandwich or bowl of soup is an example of a good first meal.  Heavy or fried foods are harder to digest and may make you feel nauseous or bloated.  Likewise meals heavy in dairy and vegetables can cause extra gas to form and this can also increase the bloating.  Drink plenty of fluids but you should avoid alcoholic beverages for 24 hours.  ACTIVITY: Your care partner should take you home directly after the procedure.  You should plan to take it easy, moving slowly for the rest of the day.  You can resume normal activity the day after the procedure however you should NOT DRIVE or use heavy machinery for 24 hours (because of the sedation medicines used during the test).    SYMPTOMS TO REPORT IMMEDIATELY: A  gastroenterologist can be reached at any hour.  During normal business hours, 8:30 AM to 5:00 PM Monday through Friday, call [phone].  After hours and on weekends, please call the GI answering service at [phone] who will take a message and have the physician on call contact you.   Following lower endoscopy (colonoscopy or flexible sigmoidoscopy):  Excessive amounts of blood in the stool  Significant tenderness or worsening of abdominal pains  Swelling of the abdomen that is new, acute  Fever of 100F or higher  Following upper endoscopy (EGD)  Vomiting of blood or coffee ground material  New chest pain or pain under the shoulder blades  Painful or persistently difficult swallowing  New shortness of breath  Fever of 100F or higher  Black, tarry-looking stools  FOLLOW UP: If any biopsies were taken you will be contacted by phone or by letter within the next 1-3 weeks.  Call your gastroenterologist if you have not heard about the biopsies in 3 weeks.  Our staff will call the home number listed on your records the next business day following your procedure to check on you and address any questions or concerns that you may have at that time regarding the information given to you following your procedure. This is a courtesy call and so if there is no answer at the home number and we have not heard from you through the emergency physician on call, we will  assume that you have returned to your regular daily activities without incident.  SIGNATURES/CONFIDENTIALITY: You and/or your care partner have signed paperwork which will be entered into your electronic medical record.  These signatures attest to the fact that that the information above on your After Visit Summary has been reviewed and is understood.  Full responsibility of the confidentiality of this discharge information lies with you and/or your care-partner.   Please follow all discharge instructions given to you by the recovery  room nurse. If you have any questions or problems after discharge please call one of the numbers listed above. You will receive a phone call in the am to see how you are doing and answer any questions you may have. Thank you for choosing [facility] for your health care needs.

## 2012-03-19 ENCOUNTER — Telehealth: Payer: Self-pay | Admitting: *Deleted

## 2012-03-19 NOTE — Telephone Encounter (Signed)
  Follow up Call-  Call back number 03/16/2012  Post procedure Call Back phone  # [phone]  Permission to leave phone message Yes     Patient questions:  Do you have a fever, pain , or abdominal swelling? no Pain Score  0 *  Have you tolerated food without any problems? yes  Have you been able to return to your normal activities? yes  Do you have any questions about your discharge instructions: Diet   no Medications  no Follow up visit  no  Do you have questions or concerns about your Care? no  Actions: * If pain score is 4 or above: No action needed, pain <4.

## 2012-03-20 DIAGNOSIS — M899 Disorder of bone, unspecified: Secondary | ICD-10-CM | POA: Diagnosis not present

## 2012-03-21 ENCOUNTER — Encounter: Payer: Self-pay | Admitting: Gastroenterology

## 2012-03-28 DIAGNOSIS — N3010 Interstitial cystitis (chronic) without hematuria: Secondary | ICD-10-CM | POA: Diagnosis not present

## 2012-04-17 DIAGNOSIS — M545 Low back pain, unspecified: Secondary | ICD-10-CM | POA: Diagnosis not present

## 2012-04-17 DIAGNOSIS — M47817 Spondylosis without myelopathy or radiculopathy, lumbosacral region: Secondary | ICD-10-CM | POA: Diagnosis not present

## 2012-04-18 ENCOUNTER — Ambulatory Visit (INDEPENDENT_AMBULATORY_CARE_PROVIDER_SITE_OTHER): Payer: Medicare Other | Admitting: Gastroenterology

## 2012-04-18 ENCOUNTER — Encounter: Payer: Self-pay | Admitting: Gastroenterology

## 2012-04-18 VITALS — BP 122/70 | HR 64 | Ht 61.0 in | Wt 146.0 lb

## 2012-04-18 DIAGNOSIS — K219 Gastro-esophageal reflux disease without esophagitis: Secondary | ICD-10-CM

## 2012-04-18 DIAGNOSIS — R1310 Dysphagia, unspecified: Secondary | ICD-10-CM

## 2012-04-18 MED ORDER — OMEPRAZOLE-SODIUM BICARBONATE 40-1100 MG PO CAPS: ORAL_CAPSULE | ORAL | Status: DC

## 2012-04-18 NOTE — Assessment & Plan Note (Signed)
No stricture was seen at endoscopy. Symptoms have subsided.

## 2012-04-18 NOTE — Assessment & Plan Note (Signed)
Symptoms are improved with increasing omeprazole although she still has frequent break through pyrosis, especially at night  Recommendations #1 DC omeprazole; begin Zegerid 40 mg each bedtime

## 2012-04-18 NOTE — Patient Instructions (Addendum)
We are giving you Zegerid Samples today Follow up as needed

## 2012-04-18 NOTE — Progress Notes (Signed)
History of Present Illness:  Brittany [name] has returned for followup of chest discomfort. A cardiac stress test apparently was negative for significant disease. On a regimen of omeprazole 40 mg a day chest discomfort including pyrosis have significantly improved although she still may awaken with pyrosis several times a week. She occasionally has discomfort during the day. Symptoms of breakthrough improved when she takes antacids, milk and crackers. Upper endoscopy in December, 2013 demonstrated mild esophagogastritis.    Review of Systems: Pertinent positive and negative review of systems were noted in the above HPI section. All other review of systems were otherwise negative.    Current Medications, Allergies, Past Medical History, Past Surgical History, Family History and Social History were reviewed in [employer] electronic medical record  Vital signs were reviewed in today's medical record. Physical Exam: General: Well developed , well nourished, no acute distress

## 2012-04-24 ENCOUNTER — Telehealth: Payer: Self-pay | Admitting: Gastroenterology

## 2012-04-24 ENCOUNTER — Other Ambulatory Visit: Payer: Self-pay | Admitting: Gastroenterology

## 2012-04-24 DIAGNOSIS — R109 Unspecified abdominal pain: Secondary | ICD-10-CM

## 2012-04-24 MED ORDER — DEXLANSOPRAZOLE 60 MG PO CPDR: 60.0000 mg | DELAYED_RELEASE_CAPSULE | Freq: Two times a day (BID) | ORAL | Status: DC

## 2012-04-24 NOTE — Telephone Encounter (Signed)
Pt aware. Dexilant script sent to the pharmacy. Gastric emptying scan scheduled at [facility] 05/14/12. Pt to hold Dexilant 24hours prior to test. Pt to arrive at 9:45am for a 10am appt. Pt scheduled for OV with Dr. Arlyce Dice 05/21/12@2 :30pm.

## 2012-04-24 NOTE — Telephone Encounter (Signed)
Switch to dexilant 60 mg before breakfast and dinner Order gastric emptying scan Office visit 3-4 weeks`

## 2012-04-24 NOTE — Telephone Encounter (Signed)
Pt states she was given Zegerid for heartburn/sour stomach/pressure in her chest. She has been taking the Zegerid at night and states it was not helping. She added her old omeprazole back in the morning but states she is still having a "terrible time" with the burning. Dr. Arlyce Dice please advise.

## 2012-05-08 ENCOUNTER — Ambulatory Visit (INDEPENDENT_AMBULATORY_CARE_PROVIDER_SITE_OTHER): Payer: Medicare Other | Admitting: Gastroenterology

## 2012-05-08 ENCOUNTER — Telehealth: Payer: Self-pay | Admitting: Gastroenterology

## 2012-05-08 ENCOUNTER — Encounter: Payer: Self-pay | Admitting: Gastroenterology

## 2012-05-08 VITALS — BP 128/80 | HR 82 | Ht 62.0 in | Wt 142.8 lb

## 2012-05-08 DIAGNOSIS — K219 Gastro-esophageal reflux disease without esophagitis: Secondary | ICD-10-CM

## 2012-05-08 MED ORDER — SUCRALFATE 1 GM/10ML PO SUSP: 1.0000 g | Freq: Four times a day (QID) | ORAL | Status: DC

## 2012-05-08 NOTE — Telephone Encounter (Signed)
Pt states that she is still having problems with epigastric pain and reflux. States that the dexilant helped for the first few days but states she is waking up about 2am with terrible reflux/pain. Pt scheduled to see Doug Sou PA today at 2:15pm. Pt aware of appt date and time.

## 2012-05-08 NOTE — Progress Notes (Signed)
05/08/2012 [name] [id number] 1928/02/28   History of Present Illness: Brittany [name] is a pleasant 79 year old female who is a patient of Dr. Marzetta [name].  She comes in today for ongoing issues with severe acid reflux that wakes her from sleep every night.  She has been on omeprazole, zegerid at bedtime, and Dexilant BID without any lasting relief; Dexilant worked for a week, but then symptoms returned.  She complains of heartburn with pain into her chest and neck along with epigastric abdominal pain.  A cardiac stress test apparently was negative for significant disease.  Symptoms of breakthrough improve only minimally when she takes pepto-bismol or maalox or milk and crackers.  Upper endoscopy in December 2013 demonstrated mild non-erosive esophagogastritis.  She says that she sleeps while sitting upright.  Even with taking ambien or valium before bed she is unable to stay asleep because the symptoms wake her up.  She is scheduled for a GES next week.  Complains of a "sore stomach" and gnawing feeling when she is not having the heartburn.  Current Medications, Allergies, Past Medical History, Past Surgical History, Family History and Social History were reviewed in Brittany [name] record.   Physical Exam: BP 128/80  Pulse 82  Ht 5\' 2"  (1.575 m)  Wt 142 lb 12.8 oz (64.774 kg)  BMI 26.12 kg/m2  SpO2 97% General: Well developed, white female in no acute distress Head: Normocephalic and atraumatic Eyes:  sclerae anicteric, conjunctiva pink  Ears: Normal auditory acuity Lungs: Clear throughout to auscultation Heart: Regular rate and rhythm Abdomen: Soft, non-distended. No masses, no hepatomegaly. Normal bowel sounds.  Moderate TTP in the epigastrium w/o R/R/G. Extremities: No edema  Neurological: Alert oriented x 4, grossly nonfocal Psychological:  Alert and cooperative. Normal mood and affect  Assessment and Recommendations: -Severe refractory GERD:  Patient will  continue BID Dexilant.  We will add carafate suspension ACHS to her regimen.  GERD lifestyle/dietary modifications.  Proceed with GES next week and keep follow-up appointment with Dr. Arlyce [name] 2/10.

## 2012-05-08 NOTE — Patient Instructions (Addendum)
We have sent the following medications to your pharmacy for you to pick up at your convenience: carafate  Continue taking Dexilant twice a day.   You have been given a handout on GERD diet handout

## 2012-05-09 NOTE — Progress Notes (Signed)
Reviewed and agree with management. Andreka Stucki D. Maekayla Giorgio, M.D., FACG  

## 2012-05-12 ENCOUNTER — Other Ambulatory Visit: Payer: Self-pay | Admitting: Physician Assistant

## 2012-05-14 ENCOUNTER — Encounter (HOSPITAL_COMMUNITY)
Admission: RE | Admit: 2012-05-14 | Discharge: 2012-05-14 | Disposition: A | Discharge status: Home / Self Care | Payer: Medicare Other | Source: Ambulatory Visit | Attending: Gastroenterology | Admitting: Gastroenterology

## 2012-05-14 DIAGNOSIS — R109 Unspecified abdominal pain: Secondary | ICD-10-CM | POA: Insufficient documentation

## 2012-05-14 MED ORDER — TECHNETIUM TC 99M SULFUR COLLOID
2.0000 | Freq: Once | INTRAVENOUS | Status: AC | PRN
  Administered 2012-05-14: 2 via ORAL

## 2012-05-14 NOTE — Telephone Encounter (Signed)
Send next request for Promethazine to Dr. Melvia Heaps.

## 2012-05-15 DIAGNOSIS — M545 Low back pain, unspecified: Secondary | ICD-10-CM | POA: Diagnosis not present

## 2012-05-15 DIAGNOSIS — E782 Mixed hyperlipidemia: Secondary | ICD-10-CM | POA: Diagnosis not present

## 2012-05-17 ENCOUNTER — Other Ambulatory Visit: Payer: Self-pay | Admitting: Gastroenterology

## 2012-05-21 ENCOUNTER — Ambulatory Visit (INDEPENDENT_AMBULATORY_CARE_PROVIDER_SITE_OTHER): Payer: Medicare Other | Admitting: Gastroenterology

## 2012-05-21 ENCOUNTER — Encounter: Payer: Self-pay | Admitting: Gastroenterology

## 2012-05-21 VITALS — BP 122/72 | HR 76 | Ht 61.0 in | Wt 144.0 lb

## 2012-05-21 DIAGNOSIS — R1013 Epigastric pain: Secondary | ICD-10-CM | POA: Diagnosis not present

## 2012-05-21 DIAGNOSIS — R1310 Dysphagia, unspecified: Secondary | ICD-10-CM | POA: Diagnosis not present

## 2012-05-21 DIAGNOSIS — R110 Nausea: Secondary | ICD-10-CM | POA: Diagnosis not present

## 2012-05-21 DIAGNOSIS — K219 Gastro-esophageal reflux disease without esophagitis: Secondary | ICD-10-CM

## 2012-05-21 NOTE — Patient Instructions (Addendum)
Reduce Dexilant to once daily Call back in 3 days to let us know how you are Follow up in one month

## 2012-05-21 NOTE — Assessment & Plan Note (Signed)
She may have an early esophageal stricture not appreciated by endoscopy. Once her other GI problems have resolved I will proceed with [facility] dilation.

## 2012-05-21 NOTE — Progress Notes (Signed)
History of Present Illness: Patient returns for followup of abdominal pain and pyrosis. Since adding Carafate pain and pyrosis have improved. Her main complaint is severe nausea. This occurs throughout the day but has not particularly worsened postprandially. Pain is better although it continues. Gastric emptying scan demonstrated 35% retention at 2 hours which is slightly abnormal. He's having some dysphagia to solids.   Review of Systems: Pertinent positive and negative review of systems were noted in the above HPI section. All other review of systems were otherwise negative.    Current Medications, Allergies, Past Medical History, Past Surgical History, Family History and Social History were reviewed in [employer] electronic medical record  Vital signs were reviewed in today's medical record. Physical Exam: General: Well developed , well nourished, no acute distress

## 2012-05-21 NOTE — Assessment & Plan Note (Signed)
Symptoms are improved with the addition of Carafate. I have some concern that dexilant may be causing her pain and nausea.  Recommendations #1 reduce dexilant to once daily; if not improved will add Reglan

## 2012-05-21 NOTE — Assessment & Plan Note (Signed)
Abdominal pain and nausea may be related to dexilant. Plan to reduce to once daily and possibly discontinue altogether. In addition, she does have mild gastroparesis. It symptoms are not improved with reduction of dexilant I will add Reglan

## 2012-05-21 NOTE — Assessment & Plan Note (Signed)
Pain may be related to dexilant. Plan to decrease to once daily and possibly discontinue

## 2012-05-22 DIAGNOSIS — Z1231 Encounter for screening mammogram for malignant neoplasm of breast: Secondary | ICD-10-CM | POA: Diagnosis not present

## 2012-05-23 ENCOUNTER — Telehealth: Payer: Self-pay | Admitting: Gastroenterology

## 2012-05-28 ENCOUNTER — Telehealth: Payer: Self-pay | Admitting: Gastroenterology

## 2012-05-28 ENCOUNTER — Other Ambulatory Visit: Payer: Self-pay | Admitting: Gastroenterology

## 2012-05-28 NOTE — Telephone Encounter (Signed)
Pt states that she decreased her Dexilant to once a day and she is taking Carafate. States she is having nausea and abdominal pain. Dr. Arlyce Dice please advise.

## 2012-05-28 NOTE — Telephone Encounter (Signed)
Pt aware.

## 2012-05-28 NOTE — Telephone Encounter (Signed)
Dr Kaplan please advise 

## 2012-05-28 NOTE — Telephone Encounter (Signed)
Discontinue dexilant while continuing Carafate. Call back in 2-3 days to report progress.

## 2012-05-29 NOTE — Telephone Encounter (Signed)
Begin reglan 10mg  1/2ac and hs Continue carafate C/b at end of week  Instruct patient to  contact me immediately  if she develops any side effects from her Reglan including paresthesias, tremors, confusion , weakness or muscle spasms.

## 2012-05-31 ENCOUNTER — Telehealth: Payer: Self-pay | Admitting: Gastroenterology

## 2012-05-31 MED ORDER — METOCLOPRAMIDE HCL 10 MG PO TABS: 10.0000 mg | ORAL_TABLET | Freq: Four times a day (QID) | ORAL | Status: DC

## 2012-05-31 NOTE — Telephone Encounter (Signed)
Spoke with pt and she is to continue the carafate and start reglan. Script sent to pharmacy. See previous phone note.

## 2012-05-31 NOTE — Telephone Encounter (Signed)
Pt aware and script sent to pharmacy. 

## 2012-06-06 DIAGNOSIS — N3010 Interstitial cystitis (chronic) without hematuria: Secondary | ICD-10-CM | POA: Diagnosis not present

## 2012-06-06 DIAGNOSIS — R3129 Other microscopic hematuria: Secondary | ICD-10-CM | POA: Diagnosis not present

## 2012-06-06 DIAGNOSIS — N3941 Urge incontinence: Secondary | ICD-10-CM | POA: Diagnosis not present

## 2012-06-14 ENCOUNTER — Other Ambulatory Visit: Payer: Self-pay | Admitting: *Deleted

## 2012-06-14 MED ORDER — RIVAROXABAN 20 MG PO TABS: 20.0000 mg | ORAL_TABLET | Freq: Every day | ORAL | Status: DC

## 2012-06-14 NOTE — Telephone Encounter (Signed)
Fax Received. Refill Completed. Octavia Chowoe (R.M.A)   

## 2012-06-25 DIAGNOSIS — M5137 Other intervertebral disc degeneration, lumbosacral region: Secondary | ICD-10-CM | POA: Diagnosis not present

## 2012-06-25 DIAGNOSIS — IMO0002 Reserved for concepts with insufficient information to code with codable children: Secondary | ICD-10-CM | POA: Diagnosis not present

## 2012-06-25 DIAGNOSIS — M5380 Other specified dorsopathies, site unspecified: Secondary | ICD-10-CM | POA: Diagnosis not present

## 2012-07-09 ENCOUNTER — Telehealth: Payer: Self-pay | Admitting: Gastroenterology

## 2012-07-09 MED ORDER — PANTOPRAZOLE SODIUM 40 MG PO TBEC: 40.0000 mg | DELAYED_RELEASE_TABLET | Freq: Every day | ORAL | Status: DC

## 2012-07-09 NOTE — Telephone Encounter (Signed)
Pt aware, script sent to the pharmacy and OV scheduled. Pt aware of appt date and time.

## 2012-07-09 NOTE — Telephone Encounter (Signed)
Pt states she has been doing well for about a month but the reflux and heartburn has come back. Pt is only taking reglan qid right now. Pt wants to know if she needs to be seen or should she start taking some different meds for the heartburn. Pt took Dexilant for a while but she had abdominal pain an nausea with this. Please advise.

## 2012-07-09 NOTE — Telephone Encounter (Signed)
Discontinue dexilant and began Protonix 40 mg daily. She should return in 2-3 weeks. Call center if she's not feeling better.

## 2012-07-16 ENCOUNTER — Ambulatory Visit (INDEPENDENT_AMBULATORY_CARE_PROVIDER_SITE_OTHER): Payer: Medicare Other | Admitting: Physician Assistant

## 2012-07-16 ENCOUNTER — Telehealth: Payer: Self-pay | Admitting: Gastroenterology

## 2012-07-16 ENCOUNTER — Encounter: Payer: Self-pay | Admitting: Physician Assistant

## 2012-07-16 ENCOUNTER — Other Ambulatory Visit (INDEPENDENT_AMBULATORY_CARE_PROVIDER_SITE_OTHER): Payer: Medicare Other

## 2012-07-16 VITALS — BP 102/60 | HR 79 | Ht 61.0 in | Wt 143.2 lb

## 2012-07-16 DIAGNOSIS — K2940 Chronic atrophic gastritis without bleeding: Secondary | ICD-10-CM

## 2012-07-16 DIAGNOSIS — R1013 Epigastric pain: Secondary | ICD-10-CM | POA: Diagnosis not present

## 2012-07-16 DIAGNOSIS — K2950 Unspecified chronic gastritis without bleeding: Secondary | ICD-10-CM | POA: Insufficient documentation

## 2012-07-16 LAB — CBC WITH DIFFERENTIAL/PLATELET
Basophils Relative: 0.4 % (ref 0.0–3.0)
Eosinophils Absolute: 0.1 10*3/uL (ref 0.0–0.7)
HCT: 42.7 % (ref 36.0–46.0)
Hemoglobin: 14.2 g/dL (ref 12.0–15.0)
MCHC: 33.3 g/dL (ref 30.0–36.0)
MCV: 87.4 fl (ref 78.0–100.0)
Monocytes Absolute: 0.9 10*3/uL (ref 0.1–1.0)
Neutro Abs: 8.6 10*3/uL — ABNORMAL HIGH (ref 1.4–7.7)
RBC: 4.88 Mil/uL (ref 3.87–5.11)

## 2012-07-16 LAB — HEPATIC FUNCTION PANEL
Alkaline Phosphatase: 66 U/L (ref 39–117)
Bilirubin, Direct: 0.2 mg/dL (ref 0.0–0.3)
Total Bilirubin: 0.9 mg/dL (ref 0.3–1.2)

## 2012-07-16 MED ORDER — PANTOPRAZOLE SODIUM 40 MG PO TBEC: 40.0000 mg | DELAYED_RELEASE_TABLET | Freq: Every day | ORAL | Status: DC

## 2012-07-16 MED ORDER — METOCLOPRAMIDE HCL 10 MG PO TABS: ORAL_TABLET | ORAL | Status: DC

## 2012-07-16 MED ORDER — SUCRALFATE 1 GM/10ML PO SUSP: ORAL | Status: DC

## 2012-07-16 NOTE — Progress Notes (Signed)
Subjective:    Patient ID: Brittany [name], female    DOB: [birth year], 79 y.o.   MRN: [id number]  HPI Brittany [name] is a very nice 79 year old white female known to Dr. Arlyce Dice who has history of chronic gastritis, esophagitis, mild gastroparesis and is status post cholecystectomy. She also has history of aortic stenosis, atrial fibrillation, hypertension and hyperlipidemia. She has had chronic problems with epigastric pain over the years and comes in today with complaints of a bad flare of sharp epigastric pain which occurred yesterday and was constant all day long and into the night preventing her from sleep. She says it's been better over the past couple of hours but not completely gone. She says this feels similar to her usual stomach pain but worse she has not had any vomiting or diarrhea no fever or chills no complaints of dysphagia or odynophagia area Again she describes it as a knifelike stabbing type pain She had undergone upper endoscopy in December of 2013 and was found to have a nonerosive gastritis duodenitis and esophagitis. Biopsy showed a reactive gastropathy with chronic gastritis no H. pylori. She had gastric emptying scan done in February 2014 showing mild normality with 35% retention at 2 hours. She had a CT scan of the abdomen and pelvis a little over a year ago which was negative. She has been on a variety of medicine since February due to complaints of epigastric pain and has had her acid blocker switched from omeprazole to Dexilant and now Protonix. She had also been given a trial of Reglan and had been on Carafate which was stopped. When she called a week or so ago she had not been taking Carafate her Reglan was stopped and she was started on Protonix. She is not on any aspirin or NSAIDs and says she has been eating very bland primarily milk and crackers when she has these flares. She thinks that she does better when she stays on Carafate She denies any chest pain shortness of breath or  dizziness with these episodes of pain.    Review of Systems  Constitutional: Positive for appetite change.  HENT: Negative.   Eyes: Negative.   Respiratory: Negative.   Cardiovascular: Negative.   Gastrointestinal: Positive for abdominal pain.  Endocrine: Negative.   Genitourinary: Negative.   Musculoskeletal: Negative.   Skin: Negative.   Allergic/Immunologic: Negative.   Neurological: Negative.   Hematological: Negative.   Psychiatric/Behavioral: Negative.    Outpatient Prescriptions Prior to Visit  Medication Sig Dispense Refill  . atenolol (TENORMIN) 50 MG tablet Take 50 mg by mouth daily.      Marland Kitchen atorvastatin (LIPITOR) 20 MG tablet 1 tab a day      . cholecalciferol (VITAMIN D) 1000 UNITS tablet Take 1,000 Units by mouth daily.      . diazepam (VALIUM) 5 MG tablet Take 5 mg by mouth every 8 (eight) hours as needed. For spasms      . HYDROcodone-acetaminophen (NORCO/VICODIN) 5-325 MG per tablet       . Omega-3 Fatty Acids (FISH OIL PO) Take 1 capsule by mouth daily.       Marland Kitchen oxybutynin (DITROPAN) 5 MG tablet Take 5 mg by mouth 3 (three) times daily.      . promethazine (PHENERGAN) 25 MG tablet TAKE 1/2 TABLET EVERY 6 HOURS AS NEEDED FOR NAUSEA  30 tablet  0  . Rivaroxaban (XARELTO) 20 MG TABS Take 1 tablet (20 mg total) by mouth daily.  30 tablet  6  . traMADol (  ULTRAM) 50 MG tablet Take 2 tablets (100 mg total) by mouth every 6 (six) hours as needed.  30 tablet  0  . pantoprazole (PROTONIX) 40 MG tablet Take 1 tablet (40 mg total) by mouth daily.  30 tablet  3  . amLODipine (NORVASC) 10 MG tablet Take 5 mg by mouth daily. 1/2 tablet every day      . CARAFATE 1 GM/10ML suspension TAKE 10 MLS (1 G TOTAL) BY MOUTH 4 (FOUR) TIMES DAILY.  420 mL  0  . dexlansoprazole (DEXILANT) 60 MG capsule Take 1 capsule (60 mg total) by mouth 2 (two) times daily.  60 capsule  3  . metoCLOPramide (REGLAN) 10 MG tablet Take 1 tablet (10 mg total) by mouth 4 (four) times daily.  120 tablet  1   No  facility-administered medications prior to visit.   Allergies  Allergen Reactions  . Amoxicillin Diarrhea  . Cephalexin Diarrhea  . Codeine Nausea Only  . Lisinopril Cough  . 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 Neurontin (Gabapentin)     Legs swell  . Nitrofurantoin Monohyd Macro Nausea Only  . Prednisone Nausea Only  . Sulfa Antibiotics Rash   Patient Active Problem List  Diagnosis  . Aortic stenosis  . Hypertension  . Chest pain  . Atrial fibrillation  . Dyspnea on exertion  . Dysphagia, unspecified  . Esophageal reflux  . Cellulitis of foot  . Nausea alone  . Abdominal pain, epigastric  . Chronic gastritis   History  Substance Use Topics  . Smoking status: Former Smoker    Quit date: 03/06/1990  . Smokeless tobacco: Never Used     Comment: Quit 40 years ago  . Alcohol Use: No       Objective:   Physical Exam elevate elderly white female in no acute distress, pleasant blood pressure 102/60 pulse 79 height 5 foot 1 weight 143. HEENT; nontraumatic normocephalic EOMI PERRLA sclera anicteric, Neck;Supple no JVD, Cardiovascular; regular rate and rhythm with S1-S2 soft systolic murmur Pulmonary; clear bilaterally, Abdomen; soft, she is tender in the epigastrium there is no guarding or rebound no palpable mass or hepatosplenomegaly bowel sounds are active, Rectal; exam not done, Extremities; no clubbing cyanosis or edema skin warm and dry, Psych; mood and affect normal and appropriate.        Assessment & Plan:  #1 79 year old female with constant epigastric pain over the past 24 hours despite  protonix Patient is status post cholecystectomy and has history of chronic gastritis and mild gastroparesis Suspect her pain is due to ongoing irritation from chronic gastritis however will also rule out pancreatitis, choledocholithiasis etc.  Plan; CBC, lipase and hepatic panel today Continue Protonix  40 mg by mouth daily-refill sent Restart Reglan 5 mg one half hour before meals and at bedtime-refills sent Restart Carafate slurry 1 g 4 times daily between meals and at bedtime x2 weeks and then decrease to twice daily indefinitely . She will followup with Dr. Arlyce Dice next week if her symptoms are no better, if she is improved she is advised he can cancel her followup appointment in followup as needed

## 2012-07-16 NOTE — Telephone Encounter (Signed)
Pt states she is having a lot of epigastric pain and reflux. States it is worse since she has been taking the protonix. Pt scheduled to see Mike Gip PA today at 2pm. Pt aware of appt date and time.

## 2012-07-16 NOTE — Patient Instructions (Addendum)
Please go to the basement level to have your labs drawn.  We sent refills to [facility] for: 1. Protonix 2. Reglan ( Metoclopramide) 3. Carafate Liquid  We will call you with the lab results.

## 2012-07-17 ENCOUNTER — Other Ambulatory Visit: Payer: Self-pay | Admitting: *Deleted

## 2012-07-17 DIAGNOSIS — R109 Unspecified abdominal pain: Secondary | ICD-10-CM

## 2012-07-18 ENCOUNTER — Ambulatory Visit: Payer: Self-pay | Admitting: Nurse Practitioner

## 2012-07-20 ENCOUNTER — Ambulatory Visit (HOSPITAL_COMMUNITY)
Admission: RE | Admit: 2012-07-20 | Discharge: 2012-07-20 | Disposition: A | Discharge status: Home / Self Care | Payer: Medicare Other | Source: Ambulatory Visit | Attending: Physician Assistant | Admitting: Physician Assistant

## 2012-07-20 DIAGNOSIS — Z9089 Acquired absence of other organs: Secondary | ICD-10-CM | POA: Diagnosis not present

## 2012-07-20 DIAGNOSIS — K838 Other specified diseases of biliary tract: Secondary | ICD-10-CM | POA: Diagnosis not present

## 2012-07-20 DIAGNOSIS — R109 Unspecified abdominal pain: Secondary | ICD-10-CM

## 2012-07-20 DIAGNOSIS — R7989 Other specified abnormal findings of blood chemistry: Secondary | ICD-10-CM | POA: Diagnosis not present

## 2012-07-20 NOTE — Progress Notes (Signed)
Reviewed and agree with management. Pamila Mendibles D. Elvie Maines, M.D., FACG  

## 2012-07-23 DIAGNOSIS — M5380 Other specified dorsopathies, site unspecified: Secondary | ICD-10-CM | POA: Diagnosis not present

## 2012-07-25 ENCOUNTER — Ambulatory Visit (INDEPENDENT_AMBULATORY_CARE_PROVIDER_SITE_OTHER): Payer: Medicare Other | Admitting: Gastroenterology

## 2012-07-25 ENCOUNTER — Encounter: Payer: Self-pay | Admitting: Gastroenterology

## 2012-07-25 VITALS — BP 124/60 | HR 66 | Ht 61.0 in | Wt 143.8 lb

## 2012-07-25 DIAGNOSIS — R1013 Epigastric pain: Secondary | ICD-10-CM | POA: Diagnosis not present

## 2012-07-25 MED ORDER — HYOSCYAMINE SULFATE 0.125 MG SL SUBL: 0.2500 mg | SUBLINGUAL_TABLET | SUBLINGUAL | Status: DC | PRN

## 2012-07-25 MED ORDER — SUCRALFATE 1 GM/10ML PO SUSP: ORAL | Status: DC

## 2012-07-25 NOTE — Patient Instructions (Addendum)
Return to see Dr. Arlyce Dice in 6 weeks.

## 2012-07-25 NOTE — Progress Notes (Signed)
History of Present Illness:  Mrs. Fuhrmann continues to have intermittent abdominal pain which she thinks is improving. Lab work was pertinent for elevation of her liver tests. AST went from normal to 78 and ALT normal to 93. Alkaline phosphatase was normal. Ultrasound demonstrated an 8 mm common bile duct. Pain will awaken her. It rarely has radiated to the back. She feels that the combination of medicines including protonix , Reglan and Carafate are helping.  When she has taken antacids for pain she's had minimal relief.    Review of Systems: Pertinent positive and negative review of systems were noted in the above HPI section. All other review of systems were otherwise negative.    Current Medications, Allergies, Past Medical History, Past Surgical History, Family History and Social History were reviewed in [employer] electronic medical record  Vital signs were reviewed in today's medical record. Physical Exam: General: Well developed , well nourished, no acute distress Skin: anicteric Head: Normocephalic and atraumatic Eyes:  sclerae anicteric, EOMI Ears: Normal auditory acuity Mouth: No deformity or lesions Lungs: Clear throughout to auscultation Heart: Regular rate and rhythm; no murmurs, rubs or bruits Abdomen: Soft,  and non distended. No masses, hepatosplenomegaly or hernias noted. Normal Bowel sounds.  There is mild tenderness in the subxiphoid area to palpation Rectal:deferred Musculoskeletal: Symmetrical with no gross deformities  Pulses:  Normal pulses noted Extremities: No clubbing, cyanosis, edema or deformities noted Neurological: Alert oriented x 4, grossly nonfocal Psychological:  Alert and cooperative. Normal mood and affect

## 2012-07-25 NOTE — Assessment & Plan Note (Addendum)
Etiology of abdominal pain is uncertain. She seems to have improved with the combination of Carafate, protonix and Reglan.  It is noteworthy that transaminases have risen to twice normal but there is minimal dilatation of the common bile duct. This at least raises the question of pain from the biliary tract pain  from stones or spasm.  Recommendations #1 trial of hyomax when necessary pain. If she does not respond to sublingual hyomax I will obtain an MRCP. In the interim she will continue her other medications.  She will require followup liver tests.

## 2012-07-30 ENCOUNTER — Telehealth: Payer: Self-pay | Admitting: Gastroenterology

## 2012-07-30 ENCOUNTER — Other Ambulatory Visit: Payer: Self-pay | Admitting: Gastroenterology

## 2012-07-30 DIAGNOSIS — R109 Unspecified abdominal pain: Secondary | ICD-10-CM

## 2012-07-30 MED ORDER — HYOSCYAMINE SULFATE ER 0.375 MG PO TBCR: 0.3750 mg | EXTENDED_RELEASE_TABLET | Freq: Two times a day (BID) | ORAL | Status: DC

## 2012-07-30 NOTE — Telephone Encounter (Signed)
Change to hyomax 0.375 mg twice a day; discontinue sublingual Ievsin schedule MRCP

## 2012-07-30 NOTE — Telephone Encounter (Signed)
Spoke with pt and she states that she has continued to have pain everyday since her OV. Pt states the Levsin ends the pain but then it comes right back. Pt wants to know what the next step is for the pain. Dr. Arlyce Dice please advise.

## 2012-07-30 NOTE — Telephone Encounter (Signed)
Pt aware, script sent to the pharmacy. Pt scheduled for MRCP at [facility] 08/02/12. Pt to arrive there at 2:45pm for a 3pm appt, pt to be NPO after 3pm. Pt aware of appt date and time.

## 2012-07-31 ENCOUNTER — Other Ambulatory Visit (INDEPENDENT_AMBULATORY_CARE_PROVIDER_SITE_OTHER): Payer: Medicare Other

## 2012-07-31 DIAGNOSIS — R109 Unspecified abdominal pain: Secondary | ICD-10-CM

## 2012-07-31 LAB — BASIC METABOLIC PANEL
GFR: 69.74 mL/min (ref >60.00)
Potassium: 3.9 mEq/L (ref 3.5–5.1)
Sodium: 136 mEq/L (ref 135–145)

## 2012-08-02 ENCOUNTER — Other Ambulatory Visit: Payer: Self-pay | Admitting: Gastroenterology

## 2012-08-02 ENCOUNTER — Ambulatory Visit (HOSPITAL_COMMUNITY)
Admission: RE | Admit: 2012-08-02 | Discharge: 2012-08-02 | Disposition: A | Discharge status: Home / Self Care | Payer: Medicare Other | Source: Ambulatory Visit | Attending: Gastroenterology | Admitting: Gastroenterology

## 2012-08-02 DIAGNOSIS — R109 Unspecified abdominal pain: Secondary | ICD-10-CM

## 2012-08-02 DIAGNOSIS — R7989 Other specified abnormal findings of blood chemistry: Secondary | ICD-10-CM | POA: Diagnosis not present

## 2012-08-02 DIAGNOSIS — K838 Other specified diseases of biliary tract: Secondary | ICD-10-CM | POA: Diagnosis not present

## 2012-08-02 DIAGNOSIS — Z9089 Acquired absence of other organs: Secondary | ICD-10-CM | POA: Diagnosis not present

## 2012-08-02 MED ORDER — GADOBENATE DIMEGLUMINE 529 MG/ML IV SOLN
13.0000 mL | Freq: Once | INTRAVENOUS | Status: AC | PRN
  Administered 2012-08-02: 13 mL via INTRAVENOUS

## 2012-08-03 ENCOUNTER — Ambulatory Visit: Payer: Medicare Other

## 2012-08-03 ENCOUNTER — Other Ambulatory Visit: Payer: Self-pay | Admitting: Gastroenterology

## 2012-08-03 DIAGNOSIS — R1013 Epigastric pain: Secondary | ICD-10-CM

## 2012-08-03 LAB — HEPATIC FUNCTION PANEL
AST: 33 U/L (ref 0–37)
Alkaline Phosphatase: 61 U/L (ref 39–117)
Bilirubin, Direct: 0.1 mg/dL (ref 0.0–0.3)
Total Bilirubin: 0.6 mg/dL (ref 0.3–1.2)

## 2012-08-06 ENCOUNTER — Encounter: Payer: Self-pay | Admitting: Gastroenterology

## 2012-08-06 ENCOUNTER — Telehealth: Payer: Self-pay | Admitting: *Deleted

## 2012-08-06 ENCOUNTER — Ambulatory Visit (INDEPENDENT_AMBULATORY_CARE_PROVIDER_SITE_OTHER): Payer: Medicare Other | Admitting: Gastroenterology

## 2012-08-06 VITALS — BP 124/76 | HR 72 | Ht 61.0 in | Wt 143.6 lb

## 2012-08-06 DIAGNOSIS — R1013 Epigastric pain: Secondary | ICD-10-CM

## 2012-08-06 DIAGNOSIS — E782 Mixed hyperlipidemia: Secondary | ICD-10-CM | POA: Diagnosis not present

## 2012-08-06 NOTE — Progress Notes (Signed)
History of Present Illness:  The patient continues to complain of severe upper epigastric pain that may last hours at a time. It is unrelated to eating. It awakens her. Symptoms did not improved with anticholinergics.  Repeat LFTs were normal. MRCP demonstrated diffuse biliary tract dilation slightly increased compared to a prior CT with common bile duct measuring only 9 mm. There was slight tapering near the ampulla.    Review of Systems: Pertinent positive and negative review of systems were noted in the above HPI section. All other review of systems were otherwise negative.    Current Medications, Allergies, Past Medical History, Past Surgical History, Family History and Social History were reviewed in [employer] electronic medical record  Vital signs were reviewed in today's medical record. Physical Exam: General: Well developed , well nourished, no acute distress

## 2012-08-06 NOTE — Assessment & Plan Note (Addendum)
She continues to complain of severe upper epigastric pain. Workup thus far has been negative including upper endoscopy and gastric emptying scan. She's had transient elevation of LFTs and MRCP showing mild ductal dilatation with tapering of the duct into the area of the ampulla.   Possibilities of a small bile duct stone or stricture were discussed with the patient with the recommendation that she undergo ERCP. Risks including pancreatitis bleeding and perforation were discussed with the patient; she wishes to proceed. Case was discussed with Dr. Wendall Papa who concurs with the recommendations.

## 2012-08-06 NOTE — Telephone Encounter (Signed)
Please advise on holding Xeralto 2 days before procedure. Pt ercp is in the OR on friday

## 2012-08-06 NOTE — Patient Instructions (Addendum)
Your ERCP is scheduled on 08/10/2012 at [facility] at 9:30am to arrive at 8am

## 2012-08-06 NOTE — Telephone Encounter (Signed)
Contacted doctores office about pt holding [employer] on call back

## 2012-08-07 NOTE — Telephone Encounter (Signed)
Xarelto should be held for 2 days prior to any procedure that may have an increased risk of bleeding. She may take her Xarelto on Wednesday night. She should not take it on Thursday night or on Friday night.

## 2012-08-07 NOTE — Telephone Encounter (Signed)
Informed pt to hold Xarlato per dr Elease Hashimoto

## 2012-08-08 ENCOUNTER — Ambulatory Visit (HOSPITAL_COMMUNITY)
Admission: RE | Admit: 2012-08-08 | Discharge: 2012-08-08 | Disposition: A | Discharge status: Home / Self Care | Payer: Medicare Other | Source: Ambulatory Visit | Attending: Anesthesiology | Admitting: Anesthesiology

## 2012-08-08 ENCOUNTER — Encounter (HOSPITAL_COMMUNITY)
Admission: RE | Admit: 2012-08-08 | Discharge: 2012-08-08 | Disposition: A | Discharge status: Home / Self Care | Payer: Medicare Other | Source: Ambulatory Visit | Attending: Gastroenterology | Admitting: Gastroenterology

## 2012-08-08 ENCOUNTER — Encounter (HOSPITAL_COMMUNITY): Payer: Self-pay

## 2012-08-08 DIAGNOSIS — Z01812 Encounter for preprocedural laboratory examination: Secondary | ICD-10-CM | POA: Diagnosis not present

## 2012-08-08 DIAGNOSIS — J438 Other emphysema: Secondary | ICD-10-CM | POA: Diagnosis not present

## 2012-08-08 DIAGNOSIS — Z01818 Encounter for other preprocedural examination: Secondary | ICD-10-CM | POA: Insufficient documentation

## 2012-08-08 DIAGNOSIS — I10 Essential (primary) hypertension: Secondary | ICD-10-CM | POA: Diagnosis not present

## 2012-08-08 DIAGNOSIS — R079 Chest pain, unspecified: Secondary | ICD-10-CM | POA: Insufficient documentation

## 2012-08-08 LAB — CBC
MCH: 29.9 pg (ref 26.0–34.0)
MCHC: 34.1 g/dL (ref 30.0–36.0)
MCV: 87.5 fL (ref 78.0–100.0)
Platelets: 222 10*3/uL (ref 150–400)
RBC: 4.72 MIL/uL (ref 3.87–5.11)

## 2012-08-08 LAB — BASIC METABOLIC PANEL
BUN: 19 mg/dL (ref 6–23)
CO2: 30 mEq/L (ref 19–32)
Calcium: 9.7 mg/dL (ref 8.4–10.5)
Creatinine, Ser: 0.64 mg/dL (ref 0.50–1.10)
GFR calc non Af Amer: 80 mL/min — ABNORMAL LOW (ref >90)
Glucose, Bld: 80 mg/dL (ref 70–99)
Sodium: 140 mEq/L (ref 135–145)

## 2012-08-08 NOTE — Pre-Procedure Instructions (Signed)
Brittany [name]  08/08/2012   Your procedure is scheduled on:  Fri, May 2 @ 9:30 AM  Report to [facility] at 7:30 AM.  Call this number if you have problems the morning of surgery: [phone]   Remember:   Do not eat food or drink liquids after midnight.   Take these medicines the morning of surgery with A SIP OF WATER: Amlodipine(Norvasc),Atenolol(Tenormin),Diazepam(Valium),Pain Pill(if needed),Metoclopramide(Reglan),Ditropan(Oxybutynin),Protonix(Pantoprazole),Promethazine(Phenergan-if needed),and Tramadol(Ultram)              No Aspirin,Ibuprofen,Goody's,BC's,Aleve,or Fish Oil   Do not wear jewelry, make-up or nail polish.  Do not wear lotions, powders, or perfumes. You may wear deodorant.  Do not shave 48 hours prior to surgery.   Do not bring valuables to the hospital.  Contacts, dentures or bridgework may not be worn into surgery.  Leave suitcase in the car. After surgery it may be brought to your room.  For patients admitted to the hospital, checkout time is 11:00 AM the day of  discharge.   Patients discharged the day of surgery will not be allowed to drive  home.    Special Instructions: Shower using CHG 2 nights before surgery and the night before surgery.  If you shower the day of surgery use CHG.  Use special wash - you have one bottle of CHG for all showers.  You should use approximately 1/3 of the bottle for each shower.   Please read over the following fact sheets that you were given: Pain Booklet, Coughing and Deep Breathing and Surgical Site Infection Prevention

## 2012-08-08 NOTE — Progress Notes (Signed)
Dr.Kimberly Clelia Croft is Medical Md in [place]

## 2012-08-08 NOTE — Progress Notes (Signed)
Pt states like "double over heart burn pain" and took carafate and drank milk and was fine;this lasted about an hour;cardiologist does know this happens and states her heart is fine and not a problem  Cardiologist is Dr.Nasher  Echo and Stress reports in epic from 2013 Heart cath in epic from 2005  EKG in epic from 11-07-11  Denies CXR in past yr

## 2012-08-09 ENCOUNTER — Other Ambulatory Visit: Payer: Self-pay | Admitting: Gastroenterology

## 2012-08-09 DIAGNOSIS — K8010 Calculus of gallbladder with chronic cholecystitis without obstruction: Secondary | ICD-10-CM

## 2012-08-10 ENCOUNTER — Ambulatory Visit (HOSPITAL_COMMUNITY): Payer: Medicare Other

## 2012-08-10 ENCOUNTER — Encounter (HOSPITAL_COMMUNITY): Payer: Self-pay | Admitting: Anesthesiology

## 2012-08-10 ENCOUNTER — Observation Stay (HOSPITAL_COMMUNITY)
Admission: RE | Admit: 2012-08-10 | Discharge: 2012-08-11 | Disposition: A | Discharge status: Home / Self Care | Payer: Medicare Other | Source: Ambulatory Visit | Attending: Gastroenterology | Admitting: Gastroenterology

## 2012-08-10 ENCOUNTER — Ambulatory Visit (HOSPITAL_COMMUNITY): Payer: Medicare Other | Admitting: Anesthesiology

## 2012-08-10 ENCOUNTER — Encounter (HOSPITAL_COMMUNITY)
Admission: RE | Disposition: A | Discharge status: Home / Self Care | Payer: Self-pay | Source: Ambulatory Visit | Attending: Gastroenterology

## 2012-08-10 ENCOUNTER — Encounter (HOSPITAL_COMMUNITY): Payer: Self-pay

## 2012-08-10 DIAGNOSIS — Q4479 Other congenital malformations of liver: Secondary | ICD-10-CM | POA: Insufficient documentation

## 2012-08-10 DIAGNOSIS — K8050 Calculus of bile duct without cholangitis or cholecystitis without obstruction: Principal | ICD-10-CM

## 2012-08-10 DIAGNOSIS — Q447 Other congenital malformations of liver: Secondary | ICD-10-CM | POA: Insufficient documentation

## 2012-08-10 DIAGNOSIS — R1033 Periumbilical pain: Secondary | ICD-10-CM

## 2012-08-10 DIAGNOSIS — R7989 Other specified abnormal findings of blood chemistry: Secondary | ICD-10-CM | POA: Insufficient documentation

## 2012-08-10 DIAGNOSIS — K8010 Calculus of gallbladder with chronic cholecystitis without obstruction: Secondary | ICD-10-CM | POA: Diagnosis not present

## 2012-08-10 DIAGNOSIS — K8020 Calculus of gallbladder without cholecystitis without obstruction: Secondary | ICD-10-CM | POA: Diagnosis not present

## 2012-08-10 DIAGNOSIS — K838 Other specified diseases of biliary tract: Secondary | ICD-10-CM | POA: Diagnosis not present

## 2012-08-10 DIAGNOSIS — Q441 Other congenital malformations of gallbladder: Secondary | ICD-10-CM | POA: Insufficient documentation

## 2012-08-10 DIAGNOSIS — G8918 Other acute postprocedural pain: Secondary | ICD-10-CM | POA: Insufficient documentation

## 2012-08-10 DIAGNOSIS — Z7901 Long term (current) use of anticoagulants: Secondary | ICD-10-CM | POA: Diagnosis not present

## 2012-08-10 DIAGNOSIS — R1013 Epigastric pain: Secondary | ICD-10-CM | POA: Diagnosis not present

## 2012-08-10 DIAGNOSIS — I4891 Unspecified atrial fibrillation: Secondary | ICD-10-CM | POA: Diagnosis not present

## 2012-08-10 DIAGNOSIS — I10 Essential (primary) hypertension: Secondary | ICD-10-CM | POA: Insufficient documentation

## 2012-08-10 DIAGNOSIS — K219 Gastro-esophageal reflux disease without esophagitis: Secondary | ICD-10-CM | POA: Diagnosis not present

## 2012-08-10 DIAGNOSIS — K831 Obstruction of bile duct: Secondary | ICD-10-CM | POA: Diagnosis present

## 2012-08-10 HISTORY — PX: ERCP: SHX5425

## 2012-08-10 LAB — COMPREHENSIVE METABOLIC PANEL
ALT: 57 U/L — ABNORMAL HIGH (ref 0–35)
AST: 67 U/L — ABNORMAL HIGH (ref 0–37)
Albumin: 3.4 g/dL — ABNORMAL LOW (ref 3.5–5.2)
Calcium: 8.8 mg/dL (ref 8.4–10.5)
Creatinine, Ser: 0.5 mg/dL (ref 0.50–1.10)
Sodium: 137 mEq/L (ref 135–145)
Total Protein: 7.1 g/dL (ref 6.0–8.3)

## 2012-08-10 LAB — CBC
Hemoglobin: 13.8 g/dL (ref 12.0–15.0)
MCH: 29.4 pg (ref 26.0–34.0)
MCV: 86.2 fL (ref 78.0–100.0)
RBC: 4.7 MIL/uL (ref 3.87–5.11)
WBC: 15.1 10*3/uL — ABNORMAL HIGH (ref 4.0–10.5)

## 2012-08-10 SURGERY — ENDOSCOPIC RETROGRADE CHOLANGIOPANCREATOGRAPHY (ERCP)
Anesthesia: Monitor Anesthesia Care

## 2012-08-10 SURGERY — ERCP, WITH INTERVENTION IF INDICATED
Anesthesia: General

## 2012-08-10 MED ORDER — LIDOCAINE HCL (CARDIAC) 20 MG/ML IV SOLN
INTRAVENOUS | Status: DC | PRN
  Administered 2012-08-10: 70 mg via INTRAVENOUS

## 2012-08-10 MED ORDER — PANTOPRAZOLE SODIUM 40 MG PO TBEC
40.0000 mg | DELAYED_RELEASE_TABLET | Freq: Every day | ORAL | Status: DC
  Administered 2012-08-10 – 2012-08-11 (×2): 40 mg via ORAL
  Filled 2012-08-10 (×2): qty 1

## 2012-08-10 MED ORDER — OXYBUTYNIN CHLORIDE 5 MG PO TABS
5.0000 mg | ORAL_TABLET | Freq: Three times a day (TID) | ORAL | Status: DC
  Administered 2012-08-10 – 2012-08-11 (×3): 5 mg via ORAL
  Filled 2012-08-10 (×5): qty 1

## 2012-08-10 MED ORDER — ONDANSETRON HCL 4 MG/2ML IJ SOLN
INTRAMUSCULAR | Status: DC | PRN
  Administered 2012-08-10: 4 mg via INTRAVENOUS

## 2012-08-10 MED ORDER — FENTANYL CITRATE 0.05 MG/ML IJ SOLN
INTRAMUSCULAR | Status: DC | PRN
  Administered 2012-08-10 (×2): 50 ug via INTRAVENOUS

## 2012-08-10 MED ORDER — SODIUM CHLORIDE 0.9 % IV SOLN: INTRAVENOUS | Status: DC

## 2012-08-10 MED ORDER — ONDANSETRON HCL 4 MG PO TABS
4.0000 mg | ORAL_TABLET | Freq: Four times a day (QID) | ORAL | Status: DC | PRN
  Filled 2012-08-10: qty 1

## 2012-08-10 MED ORDER — LIDOCAINE HCL 4 % MT SOLN
OROMUCOSAL | Status: DC | PRN
  Administered 2012-08-10: 4 mL via TOPICAL

## 2012-08-10 MED ORDER — LACTATED RINGERS IV SOLN
INTRAVENOUS | Status: DC | PRN
  Administered 2012-08-10 (×2): via INTRAVENOUS

## 2012-08-10 MED ORDER — CIPROFLOXACIN IN D5W 400 MG/200ML IV SOLN
400.0000 mg | INTRAVENOUS | Status: AC
  Administered 2012-08-10: 400 mg via INTRAVENOUS
  Filled 2012-08-10: qty 200

## 2012-08-10 MED ORDER — HYDROMORPHONE HCL PF 1 MG/ML IJ SOLN: 0.5000 mg | INTRAMUSCULAR | Status: DC | PRN

## 2012-08-10 MED ORDER — RIVAROXABAN 20 MG PO TABS: 20.0000 mg | ORAL_TABLET | Freq: Every day | ORAL | Status: DC

## 2012-08-10 MED ORDER — AMLODIPINE BESYLATE 5 MG PO TABS
5.0000 mg | ORAL_TABLET | Freq: Every day | ORAL | Status: DC
  Administered 2012-08-10 – 2012-08-11 (×2): 5 mg via ORAL
  Filled 2012-08-10 (×3): qty 1

## 2012-08-10 MED ORDER — ONDANSETRON HCL 4 MG/2ML IJ SOLN
INTRAMUSCULAR | Status: AC
  Administered 2012-08-10: 4 mg via INTRAVENOUS
  Filled 2012-08-10: qty 2

## 2012-08-10 MED ORDER — TRAMADOL HCL 50 MG PO TABS
50.0000 mg | ORAL_TABLET | Freq: Four times a day (QID) | ORAL | Status: DC | PRN
  Administered 2012-08-10: 50 mg via ORAL
  Filled 2012-08-10: qty 1

## 2012-08-10 MED ORDER — PROPOFOL 10 MG/ML IV BOLUS
INTRAVENOUS | Status: DC | PRN
  Administered 2012-08-10: 75 mg via INTRAVENOUS

## 2012-08-10 MED ORDER — HYDROMORPHONE HCL PF 1 MG/ML IJ SOLN
0.2500 mg | INTRAMUSCULAR | Status: DC | PRN
  Administered 2012-08-10 (×2): 0.5 mg via INTRAVENOUS

## 2012-08-10 MED ORDER — HYDROMORPHONE HCL PF 1 MG/ML IJ SOLN
0.5000 mg | INTRAMUSCULAR | Status: DC | PRN
  Administered 2012-08-11 (×2): 0.5 mg via INTRAVENOUS
  Filled 2012-08-10 (×2): qty 1

## 2012-08-10 MED ORDER — METOCLOPRAMIDE HCL 10 MG PO TABS
10.0000 mg | ORAL_TABLET | Freq: Four times a day (QID) | ORAL | Status: DC
  Administered 2012-08-10 – 2012-08-11 (×3): 10 mg via ORAL
  Filled 2012-08-10 (×6): qty 1

## 2012-08-10 MED ORDER — DIAZEPAM 5 MG PO TABS
5.0000 mg | ORAL_TABLET | Freq: Four times a day (QID) | ORAL | Status: DC | PRN
  Administered 2012-08-11: 5 mg via ORAL
  Filled 2012-08-10: qty 1

## 2012-08-10 MED ORDER — HYDROMORPHONE HCL PF 1 MG/ML IJ SOLN
INTRAMUSCULAR | Status: AC
  Administered 2012-08-10: 0.5 mg via INTRAVENOUS
  Filled 2012-08-10: qty 1

## 2012-08-10 MED ORDER — KCL IN DEXTROSE-NACL 10-5-0.45 MEQ/L-%-% IV SOLN
INTRAVENOUS | Status: DC
  Administered 2012-08-10: 23:00:00 via INTRAVENOUS
  Filled 2012-08-10 (×4): qty 1000

## 2012-08-10 MED ORDER — ARTIFICIAL TEARS OP OINT
TOPICAL_OINTMENT | OPHTHALMIC | Status: DC | PRN
  Administered 2012-08-10: 1 via OPHTHALMIC

## 2012-08-10 MED ORDER — SUCRALFATE 1 GM/10ML PO SUSP
1.0000 g | Freq: Two times a day (BID) | ORAL | Status: DC
  Administered 2012-08-10 – 2012-08-11 (×2): 1 g via ORAL
  Filled 2012-08-10 (×4): qty 10

## 2012-08-10 MED ORDER — SUCCINYLCHOLINE CHLORIDE 20 MG/ML IJ SOLN
INTRAMUSCULAR | Status: DC | PRN
  Administered 2012-08-10: 60 mg via INTRAVENOUS

## 2012-08-10 MED ORDER — ONDANSETRON HCL 4 MG/2ML IJ SOLN: 4.0000 mg | Freq: Once | INTRAMUSCULAR | Status: AC | PRN

## 2012-08-10 MED ORDER — ONDANSETRON HCL 4 MG/2ML IJ SOLN
4.0000 mg | Freq: Four times a day (QID) | INTRAMUSCULAR | Status: DC | PRN
  Administered 2012-08-10 – 2012-08-11 (×2): 4 mg via INTRAVENOUS
  Filled 2012-08-10 (×3): qty 2

## 2012-08-10 MED ORDER — SODIUM CHLORIDE 0.9 % IV SOLN
INTRAVENOUS | Status: DC | PRN
  Administered 2012-08-10: 11:00:00

## 2012-08-10 MED ORDER — ATENOLOL 50 MG PO TABS
50.0000 mg | ORAL_TABLET | Freq: Every day | ORAL | Status: DC
  Administered 2012-08-10 – 2012-08-11 (×2): 50 mg via ORAL
  Filled 2012-08-10 (×2): qty 1

## 2012-08-10 MED ORDER — LACTATED RINGERS IV SOLN
INTRAVENOUS | Status: DC
  Administered 2012-08-10: 50 mL/h via INTRAVENOUS

## 2012-08-10 MED ORDER — PHENYLEPHRINE HCL 10 MG/ML IJ SOLN
INTRAMUSCULAR | Status: DC | PRN
  Administered 2012-08-10 (×2): 40 ug via INTRAVENOUS

## 2012-08-10 NOTE — H&P (Signed)
[facility] Admission Note   Primary Care Physician:  Lupita Raider, MD Primary Gastroenterologist:  Dr. Arlyce Dice.  Previous pt of [facility]  CHIEF COMPLAINT:  Abdominal pain after ERCP, sphincterotomy.   HPI: Brittany [name] is a 79 y.o. female. Hx of Atrial fibrillation. On Xarelto which was held for todays procedure.  Evaluated over several months for epigastric pain.  EGD 03/2012 showed nonerosive gastroduodenitis.  She continued on PPI, Carafate but had persistent pain.  She developed mild transaminase elevation with pain in early April 2014.  MRI/MRCP 4/24 showed diffuse biliary ductal dilatation without mass or stone.   ERCP performed today showed 2 large periampullary diverticula. The papilla was located eccentrically along the sidewall of the more proximal diverticulum. CBD was diffusely dilated without stones, contrast emptied slowly and limited sphincterotomy was done.  Minor amount of blood seen oozing after sphincterotomy. Dr Arlyce Dice also dilated the bile duct after cutting sphincter and a few stone fragments were swept with balloon. He suspects Ampullary stenosis.   In PACU has required small but repeated doses of Dilaudid and Zofran for c/o pain in epigastrum and nausea.  Intensity of pain 10/10 at worst, about 5 to 6/10 currently, 90 minutes after last Dilaudid.  Pain is in same location, but quality is different than the pain she has had in recent months.   Last took Xarelto on 4/30.     Past Medical History  Diagnosis Date  . Aortic stenosis     s/p tissue AVR '05  . Dyslipidemia     takes Lipitor daily  . Dizziness   . History of aortic stenosis   . Hyperplastic colon polyp   . Arthritis   . Depression   . Urine incontinence   . Diverticulosis   . Hx of gallstones   . IC (interstitial cystitis)   . Esophagitis   . Gastritis   . Hypertension     takes Amlodipine daily  . Stroke 2013    TIA-that has  affected peripheral vision in right eye  . Joint pain   . Joint swelling   . Back pain     2 buldging disc  . Bruises easily   . GERD (gastroesophageal reflux disease)     takes Protonix daily  . Urinary frequency   . History of bladder infections   . Nausea     takes Reglan and Phenergan prn  . Back spasm     takes Valium prn  . History of staph infection     11yrs ago  . Cellulitis 2013    foot  . Atrial fibrillation     takes Atenolol daily and Xarelto  . Hiatal hernia     Past Surgical History  Procedure Laterality Date  . Abdominal hysterectomy  1967  . Aortic valve replacement      2005 s/p [facility] pericardial tissue valve  . Appendectom;y  2130  . Cholecystectomy  1993  . Shoulder surgery      right d/t tear  . Back surgery      3 surgeries  . Vein ligation    . Tonsillectomy    . Appendectomy    . Cardiac catheterization  2005  . Bilateral cataract surgeyr    . Dilation and curettage of uterus    . Neuroplasty / transposition median nerve at carpal tunnel bilateral    . Colonoscopy    . Esophagogastroduodenoscopy      Prior to Admission medications   Medication Sig Start  Date End Date Taking? Authorizing Provider  amLODipine (NORVASC) 10 MG tablet Take 5 mg by mouth daily.   Yes Historical Provider, MD  atenolol (TENORMIN) 50 MG tablet Take 50 mg by mouth daily.   Yes Historical Provider, MD  atorvastatin (LIPITOR) 20 MG tablet Take 20 mg by mouth daily.   Yes Historical Provider, MD  cholecalciferol (VITAMIN D) 1000 UNITS tablet Take 1,000 Units by mouth daily.   Yes Historical Provider, MD  diazepam (VALIUM) 5 MG tablet Take 5 mg by mouth every 6 (six) hours as needed (for back spasms).   Yes Historical Provider, MD  HYDROcodone-acetaminophen (NORCO/VICODIN) 5-325 MG per tablet Take 1 tablet by mouth daily as needed for pain.   Yes Historical Provider, MD  hyoscyamine (LEVSIN SL) 0.125 MG SL tablet Place 0.25 mg under the tongue daily as  needed for diarrhea or loose stools.   Yes Historical Provider, MD  metoCLOPramide (REGLAN) 10 MG tablet Take 10 mg by mouth 4 (four) times daily.   Yes Historical Provider, MD  Omega-3 Fatty Acids (FISH OIL PO) Take by mouth daily.   Yes Historical Provider, MD  oxybutynin (DITROPAN) 5 MG tablet Take 5 mg by mouth 3 (three) times daily.   Yes Historical Provider, MD  pantoprazole (PROTONIX) 40 MG tablet Take 40 mg by mouth daily.   Yes Historical Provider, MD  promethazine (PHENERGAN) 25 MG tablet Take 25 mg by mouth daily as needed for nausea.   Yes Historical Provider, MD  sucralfate (CARAFATE) 1 GM/10ML suspension Take 1 g by mouth 2 (two) times daily.   Yes Historical Provider, MD  traMADol (ULTRAM) 50 MG tablet Take 50 mg by mouth every 6 (six) hours as needed for pain.   Yes Historical Provider, MD  Rivaroxaban (XARELTO) 20 MG TABS Take 1 tablet (20 mg total) by mouth at bedtime. Resume medication in 5 days 08/10/12   Louis Meckel, MD    Current Facility-Administered Medications  Medication Dose Route Frequency Provider Last Rate Last Dose  . 0.9 %  sodium chloride infusion   Intravenous Continuous Louis Meckel, MD      . dextrose 5 % and 0.45 % NaCl with KCl 10 mEq/L infusion   Intravenous Continuous Dianah Field, PA-C      . HYDROmorphone (DILAUDID) injection 0.25-0.5 mg  0.25-0.5 mg Intravenous Q5 min PRN Rivka Barbara, MD   0.5 mg at 08/10/12 1330  . lactated ringers infusion   Intravenous Continuous Rivka Barbara, MD 50 mL/hr at 08/10/12 0948 50 mL/hr at 08/10/12 0948  . Omnipaque 300 mg/mL (50 mL) in 0.9% normal saline (50 mL)    PRN Louis Meckel, MD      . ondansetron [facility]) tablet 4 mg  4 mg Oral Q6H PRN Dianah Field, PA-C       Or  . ondansetron [facility]) injection 4 mg  4 mg Intravenous Q6H PRN Dianah Field, PA-C        Allergies as of 08/06/2012 - Review Complete 08/06/2012  Allergen Reaction Noted  . Amoxicillin Diarrhea 01/06/2011  .  Cephalexin Diarrhea 01/06/2011  . Codeine Nausea Only 01/06/2011  . Lisinopril Cough 01/06/2011  . Morphine and related Other (See Comments) 01/06/2011  . Neurontin (gabapentin)  12/26/2011  . Nitrofurantoin monohyd macro Nausea Only 01/06/2011  . Prednisone Nausea Only 06/15/2011  . Sulfa antibiotics Rash 01/01/2012    Family History  Problem Relation Age of Onset  . Heart disease Father   .  Coronary artery disease Brother   . Bipolar disorder Brother     commited suiside at 8yo  . Alcohol abuse Sister     sister #1  . Alcohol abuse Sister     sister #2  . Colon cancer Maternal Aunt   . Stomach cancer Maternal Grandmother     History   Social History  . Marital Status: Widowed    Spouse Name: N/A    Number of Children: 4  . Years of Education: N/A   Occupational History  . retired    Social History Main Topics  . Smoking status: Former Smoker    Types: Cigarettes    Quit date: 03/06/1990  . Smokeless tobacco: Never Used     Comment: Quit 40 years ago  . Alcohol Use: No  . Drug Use: No  . Sexually Active: Not on file   Other Topics Concern  . Not on file   Social History Narrative  . No narrative on file    REVIEW OF SYSTEMS: Constitutional:  No weight loss ENT:  No nose bleeds or congestion Pulm:  No SOB, no PND, no cough CV:  No chest pain, no palpitations GU:  No dysuria or incontinence GI:  Per HPI.  No dysphagia Heme:  No anemia.    Transfusions:  none Neuro:  No headaches or dizziness.  Sleeps ok Derm:  No itching, sores or rash Endocrine:  No hx DM.  No excessive thirst or sweats Immunization:  Up to date flu shot Travel:  none   PHYSICAL EXAM:  Temp:  [97.5 F (36.4 C)-98 F (36.7 C)] 97.5 F (36.4 C) (05/02 1150) Pulse Rate:  [43-99] 84 (05/02 1345) Resp:  [11-18] 12 (05/02 1345) BP: (118-177)/(65-100) 124/65 mmHg (05/02 1345) SpO2:  [92 %-99 %] 96 % (05/02 1345)   General:  Frail, elderly WF who is comfortable, does not look  acutely ill Ears:  Somewhat HOH Mouth:  Moist, clear oral MM  Neck: no JVD, no masses Lungs:  Clear B.  No cough or resp distress   Heart:  Irreg, Irreg. No MRG  Abdomen:  Soft, slight epigastric tenderness    Rectal:  None performed  Msk:  No joint swelling or redness   Pulses:  3 plus pedal pulses.  Extremities:  No pedal edema  Neurologic: oriented x 3.  Not agitated,  A bit sleepy from meds Skin:  No jaundice, no sores Psych:  Pleasant, not agitated.  Relaxed.     Intake/Output from previous day:   Intake/Output this shift: Total I/O In: 900 [I.V.:900] Out: 1 [Urine:1]  LAB RESULTS:  Recent Labs  08/08/12 1401  WBC 14.0*  HGB 14.1  HCT 41.3  PLT 222   BMET  Recent Labs  08/08/12 1401  NA 140  K 4.3  CL 100  CO2 30  GLUCOSE 80  BUN 19  CREATININE 0.64  CALCIUM 9.7   LFT No results found for this basename: PROT, ALBUMIN, AST, ALT, ALKPHOS, BILITOT, BILIDIR, IBILI,  in the last 72 hours   RADIOLOGY STUDIES: Dg Ercp Biliary & Pancreatic Ducts 08/10/2012    Findings: Eight images from an ERCP, performed by Dr. Arlyce Dice, are submitted.  The biliary tree is mildly dilated.  There is a short segment of the lower common bile duct which is not well opacified on some images.  Per discussion with Dr. Arlyce Dice, several balloon sweeps were performed with return of minimal debris.  IMPRESSION: Mild dilatation of the biliary tree.  Please see discussion above.   Original Report Authenticated By: Leanna Battles, M.D.    08/02/12  MRCP/MRI abdomen IMPRESSION:  1. Mild diffuse biliary dilatation shows mild increase compared to  previous CT, but no definite calculi or other obstructing etiology  apparent by MR. An ampullary stricture or impacted stone cannot  definitely be excluded by MRCP; ERCP should be considered if  clinically warranted.  2. No evidence of pancreatic mass or pancreatic ductal dilatation.  Endoscopic study ERCP  08/10/12 DESCRIPTION OF PROCEDURE: After the  risks benefits and  alternatives of the procedure were thoroughly explained, informed  consent was obtained. The endoscope was introduced through the  mouth and advanced to the .  1. There was a large periampullary diverticulum.  2. There were 2 large periampullary diverticula. The papilla was  located eccentrically along the sidewall of the more proximal  diverticulum.  The common bile duct was selectively cannulated with a 0.35 mm  guidewire. Injection demonstrated a diffusely dilated duct. No  stones were seen. There was slow emptying of contrast from the  bile duct.A 10-12 mm sphincterotomy was done. There was minimal  oozing of blood. The duct was swept with a 12 mm balloon. No  stones were extracted.  The sphincterotomy was limited due to the small sized papilla along  the lateral wall of the diverticulum. A #8 and #10 mm biliary  dilators were inflated for 30 seconds each in an effort to further  enlarge the opening to the end of the bile duct. With the  sphincterotomy and then with the balloon dilation there was prompt  emptying of contrast dye, especially compared with the initial  injection of contrast. Final balloon sweep with a 12 mm balloon  demonstrated very few stone fragments. The12 mm balloon easily  pulled through the sphincterotomied papilla.  The scope was then completely withdrawn from the patient and the  procedure terminated.  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    IMPRESSION:   *  Pain and nausea after ERCP/Sphinct/ductal dilatation/balloon sweep bile duct for suspected ampulary stenosis. Rule out post ERCP  pancreatitis *  Atrial fibrillation. Xarelto on hold for last 48 hours.   PLAN: *  Observation admission, prn Zofran and Dilaudid.  Informed and explained the situation to one of the patient's sons.  Both he and pt aware and agreeable to admission.  *  Clears, IVF at 75/hour.  *  Continue most out patient medications. Do not restart Xarelto *  Labs now and in AM.   LOS: 0 days   Jennye Moccasin  08/10/2012, 2:42 PM Pager [phone]  Chart was reviewed and patient was examined. X-rays and lab were reviewed.    I agree with management and plans. Pt is having moderate discomfort post procedure.  No enzyme evidence for pancreatitis.  Given her age I think it's best to keep for observation.  Barbette Hair. Arlyce Dice, M.D., [facility] Cell [phone]

## 2012-08-10 NOTE — Op Note (Signed)
[facility] [address] [place], [address]   ERCP PROCEDURE REPORT  PATIENT: Shery, [place].  MR# :[id number] BIRTHDATE: [birth year]  GENDER: Female ENDOSCOPIST: Louis Meckel, MD REFERRED BY: Kristeen Miss, M.D. PROCEDURE DATE:  08/10/2012 PROCEDURE:   ERCP with sphincterotomy/papillotomy and ERCP with balloon dilation ASA CLASS:   Class II INDICATIONS:abdominal pain of suspected biliary origin. MEDICATIONS: MAC sedation, administered by CRNA and Cipro 400 mg IV  TOPICAL ANESTHETIC:  DESCRIPTION OF PROCEDURE:   After the risks benefits and alternatives of the procedure were thoroughly explained, informed consent was obtained.  The     endoscope was introduced through the mouth  and advanced to the    .  1.  There was a large periampullary diverticulum. 2.  There were 2 large periampullary diverticula.  The papilla was located eccentrically along the sidewall of the more proximal diverticulum. The common bile duct was selectively cannulated with a 0.35 mm guidewire.  Injection demonstrated a diffusely dilated duct.  No stones were seen.  There was slow emptying of contrast from the bile duct.A 10-12 mm sphincterotomy was done.  There was minimal oozing of blood.  The duct was swept with a 12 mm balloon.  No stones were extracted.  The sphincterotomy was limited due to the small sized papilla along the lateral wall of the diverticulum.  A #8 and #10 mm biliary dilators were inflated for 30 seconds each in an effort to further enlarge the opening to the end of the bile duct.  With the sphincterotomy and then with the balloon dilation there was prompt emptying of contrast dye, especially compared with the initial injection of contrast.  Final balloon sweep with a 12 mm balloon demonstrated very few stone fragments.  The12 mm balloon easily pulled through the sphincterotomied papilla. The scope was then completely withdrawn from the patient and  the procedure terminated.     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

## 2012-08-10 NOTE — Anesthesia Preprocedure Evaluation (Signed)
Anesthesia Evaluation  Patient identified by MRN, date of birth, ID band Patient awake    Reviewed: Allergy & Precautions, H&P , NPO status , Patient's Chart, lab work & pertinent test results  Airway       Dental   Pulmonary          Cardiovascular hypertension, + dysrhythmias     Neuro/Psych PSYCHIATRIC DISORDERS Depression  Neuromuscular disease CVA    GI/Hepatic hiatal hernia, GERD-  ,  Endo/Other    Renal/GU      Musculoskeletal   Abdominal   Peds  Hematology   Anesthesia Other Findings   Reproductive/Obstetrics                           Anesthesia Physical Anesthesia Plan  ASA: III  Anesthesia Plan: General   Post-op Pain Management:    Induction: Intravenous  Airway Management Planned: Oral ETT  Additional Equipment:   Intra-op Plan:   Post-operative Plan: Extubation in OR  Informed Consent: I have reviewed the patients History and Physical, chart, labs and discussed the procedure including the risks, benefits and alternatives for the proposed anesthesia with the patient or authorized representative who has indicated his/her understanding and acceptance.     Plan Discussed with: Anesthesiologist, CRNA and Surgeon  Anesthesia Plan Comments:         Anesthesia Quick Evaluation

## 2012-08-10 NOTE — Transfer of Care (Signed)
Immediate Anesthesia Transfer of Care Note  Patient: Brittany [name]  Procedure(s) Performed: Procedure(s): ENDOSCOPIC RETROGRADE CHOLANGIOPANCREATOGRAPHY (ERCP) (N/A)  Patient Location: PACU  Anesthesia Type:General  Level of Consciousness: awake, alert  and oriented  Airway & Oxygen Therapy: Patient Spontanous Breathing and Patient connected to nasal cannula oxygen  Post-op Assessment: Report given to PACU RN, Post -op Vital signs reviewed and stable and Patient moving all extremities X 4  Post vital signs: Reviewed and stable  Complications: No apparent anesthesia complications

## 2012-08-10 NOTE — H&P (View-Only) (Signed)
History of Present Illness:  The patient continues to complain of severe upper epigastric pain that may last hours at a time. It is unrelated to eating. It awakens her. Symptoms did not improved with anticholinergics.  Repeat LFTs were normal. MRCP demonstrated diffuse biliary tract dilation slightly increased compared to a prior CT with common bile duct measuring only 9 mm. There was slight tapering near the ampulla.    Review of Systems: Pertinent positive and negative review of systems were noted in the above HPI section. All other review of systems were otherwise negative.    Current Medications, Allergies, Past Medical History, Past Surgical History, Family History and Social History were reviewed in [facility] electronic medical record  Vital signs were reviewed in today's medical record. Physical Exam: General: Well developed , well nourished, no acute distress     

## 2012-08-10 NOTE — Interval H&P Note (Signed)
History and Physical Interval Note:  08/10/2012 9:40 AM  Brittany [name]  has presented today for surgery, with the diagnosis of abnormal LFT and Abdomen Pain  The various methods of treatment have been discussed with the patient and family. After consideration of risks, benefits and other options for treatment, the patient has consented to  Procedure(s): ENDOSCOPIC RETROGRADE CHOLANGIOPANCREATOGRAPHY (ERCP) (N/A) as a surgical intervention .  The patient's history has been reviewed, patient examined, no change in status, stable for surgery.  I have reviewed the patient's chart and labs.  Questions were answered to the patient's satisfaction.    The recent H&P (dated **08/06/12*) was reviewed, the patient was examined and there is no change in the patients condition since that H&P was completed.   Melvia Heaps  08/10/2012, 9:40 AM    Melvia Heaps

## 2012-08-10 NOTE — Anesthesia Procedure Notes (Signed)
Procedure Name: Intubation Date/Time: 08/10/2012 10:13 AM Performed by: Gayla Medicus Pre-anesthesia Checklist: Emergency Drugs available, Patient identified, Suction available, Patient being monitored and Timeout performed Patient Re-evaluated:Patient Re-evaluated prior to inductionOxygen Delivery Method: Circle system utilized Preoxygenation: Pre-oxygenation with 100% oxygen Intubation Type: IV induction Ventilation: Mask ventilation without difficulty Laryngoscope Size: Mac and 4 Grade View: Grade I Tube type: Oral Tube size: 7.0 mm Number of attempts: 1 Airway Equipment and Method: Stylet and LTA kit utilized Placement Confirmation: ETT inserted through vocal cords under direct vision,  positive ETCO2 and breath sounds checked- equal and bilateral Secured at: 21 cm Tube secured with: Tape Dental Injury: Teeth and Oropharynx as per pre-operative assessment

## 2012-08-10 NOTE — Preoperative (Signed)
Beta Blockers   Reason not to administer Beta Blockers:Not Applicable 

## 2012-08-10 NOTE — Anesthesia Postprocedure Evaluation (Signed)
  Anesthesia Post-op Note  Patient: Brittany [name]  Procedure(s) Performed: Procedure(s): ENDOSCOPIC RETROGRADE CHOLANGIOPANCREATOGRAPHY (ERCP) (N/A)  Patient Location: PACU  Anesthesia Type:General  Level of Consciousness: awake, oriented, sedated and patient cooperative  Airway and Oxygen Therapy: Patient Spontanous Breathing  Post-op Pain: none  Post-op Assessment: Post-op Vital signs reviewed, Patient's Cardiovascular Status Stable, Respiratory Function Stable, Patent Airway, No signs of Nausea or vomiting and Pain level controlled  Post-op Vital Signs: stable  Complications: No apparent anesthesia complications

## 2012-08-11 DIAGNOSIS — K831 Obstruction of bile duct: Secondary | ICD-10-CM | POA: Diagnosis not present

## 2012-08-11 LAB — CBC
Hemoglobin: 13.6 g/dL (ref 12.0–15.0)
MCH: 29.8 pg (ref 26.0–34.0)
MCV: 85.3 fL (ref 78.0–100.0)
RBC: 4.56 MIL/uL (ref 3.87–5.11)
WBC: 14.9 10*3/uL — ABNORMAL HIGH (ref 4.0–10.5)

## 2012-08-11 MED ORDER — RIVAROXABAN 20 MG PO TABS: 20.0000 mg | ORAL_TABLET | Freq: Every day | ORAL | Status: DC

## 2012-08-11 NOTE — Progress Notes (Signed)
     [facility] Daily Rounding Note 08/11/2012, 8:26 AM  SUBJECTIVE:       Says she had some epigastric pain overnight.  Some nausea no emesis.  Pain similar to per ERCP levels and location.  Ate low fat diet this AM:  No triggering of pain or nausea Last Dilaudid was at 0500  OBJECTIVE:         Vital signs in last 24 hours:    Temp:  [97.5 F (36.4 C)-98.6 F (37 C)] 98.3 F (36.8 C) (05/03 0500) Pulse Rate:  [43-106] 85 (05/03 0500) Resp:  [10-27] 17 (05/03 0500) BP: (108-177)/(58-103) 148/82 mmHg (05/03 0500) SpO2:  [90 %-99 %] 90 % (05/03 0500) Weight:  [65.545 kg (144 lb 8 oz)] 65.545 kg (144 lb 8 oz) (05/02 1643) Last BM Date: 08/09/12 General: looks well, comfortable   Heart: Irreg, irreg Chest: crackles in bases.  No cough or dyspnea Abdomen: soft, NT, ND.  Active BS  Extremities: no pedal edema Neuro/Psych:  Pleasant, comfortable , appropriate and relaxed.   Intake/Output from previous day: 05/02 0701 - 05/03 0700 In: 1987 [P.O.:240; I.V.:1747] Out: 6 [Urine:6]  Intake/Output this shift:    Lab Results:  Recent Labs  08/08/12 1401 08/10/12 1552 08/11/12 0516  WBC 14.0* 15.1* 14.9*  HGB 14.1 13.8 13.6  HCT 41.3 40.5 38.9  PLT 222 200 203   BMET  Recent Labs  08/08/12 1401 08/10/12 1552  NA 140 137  K 4.3 3.6  CL 100 100  CO2 30 26  GLUCOSE 80 113*  BUN 19 12  CREATININE 0.64 0.50  CALCIUM 9.7 8.8   LFT  Recent Labs  08/10/12 1552  PROT 7.1  ALBUMIN 3.4*  AST 67*  ALT 57*  ALKPHOS 78  BILITOT 0.7   Lipase        62     31 Amylase              55  Studies/Results: Dg Ercp Biliary & Pancreatic Ducts 08/10/2012  .  IMPRESSION: Mild dilatation of the biliary tree.  Please see discussion above.   Original Report Authenticated By: Leanna Battles, M.D.     ASSESMENT: * Pain and nausea after ERCP/Sphinct/ductal dilatation/balloon sweep bile duct for suspected ampulary stenosis. Lipase not high enough to rule in post ERCP pancreatitis.  Pt  looks well.  Only time will tell if the stricturoplasty and dilation will remedy her abdominal pain.  *  Leukocytosis, predates admission.  WBCs to 14.2 on 07/16/12  * Atrial fibrillation. Xarelto on hold for last 48 hours.   *  Hypertension.  Continues on all her home BP meds.    PLAN: *  Resume Xarelto on 5/7 per Dr Marzetta Board note of 5/2.  *  Home today.  Asked pt to call GI office early in week to let us know how she is doing.  *  No additional anelgesics added.    LOS: 1 day   Jennye Moccasin  08/11/2012, 8:26 AM Pager: [phone]  [facility] Attending  I have also seen and assessed the patient and agree with the above note.  She looks good - no signs of ERCP complications. Tolerated breakfast. For release from obs today.  Iva Boop, MD, [facility] [phone] (pager) 08/11/2012 10:09 AM

## 2012-08-13 ENCOUNTER — Encounter (HOSPITAL_COMMUNITY): Payer: Self-pay | Admitting: Gastroenterology

## 2012-08-13 DIAGNOSIS — R1033 Periumbilical pain: Secondary | ICD-10-CM

## 2012-08-13 NOTE — Discharge Summary (Signed)
Gastroenterology Discharge Summary  Name: Brittany [name] MRN: [id number] DOB: 17-Oct-1927 79 y.o. PCP:  Lupita Raider, MD  Date of Admission: 08/10/2012  7:23 AM Date of Discharge: 08/13/2012 Attending Physician:  Corinda [facility]:   Dr Arlyce Dice  Admitting Dignosis: Active Problems:  *  Biliary Ampulary stenosis.   *  Acute on chronic abdominal pain following ERCP/sphincterotomy/bile duct dilatation/baloon sweep of bile duct  *  Hx atrial fibrillation, on chronic Xarelto    Past Medical History  Diagnosis Date  . Aortic stenosis     s/p tissue AVR '05  . Dyslipidemia     takes Lipitor daily  . Dizziness   . History of aortic stenosis   . Hyperplastic colon polyp   . Arthritis   . Depression   . Urine incontinence   . Diverticulosis   . Hx of gallstones   . IC (interstitial cystitis)   . Esophagitis   . Gastritis   . Hypertension     takes Amlodipine daily  . Stroke 2013    TIA-that has affected peripheral vision in right eye  . Joint pain   . Joint swelling   . Back pain     2 buldging disc  . Bruises easily   . GERD (gastroesophageal reflux disease)     takes Protonix daily  . Urinary frequency   . History of bladder infections   . Nausea     takes Reglan and Phenergan prn  . Back spasm     takes Valium prn  . History of staph infection     13yrs ago  . Cellulitis 2013    foot  . Atrial fibrillation     takes Atenolol daily and Xarelto  . Hiatal hernia    Past Surgical History  Procedure Laterality Date  . Abdominal hysterectomy  1967  . Aortic valve replacement      2005 s/p [facility] pericardial tissue valve  . Appendectom;y  4098  . Cholecystectomy  1993  . Shoulder surgery      right d/t tear  . Back surgery      3 surgeries  . Vein ligation    . Tonsillectomy    . Appendectomy    . Cardiac catheterization  2005  . Bilateral cataract surgeyr    . Dilation and curettage of uterus    . Neuroplasty / transposition median nerve  at carpal tunnel bilateral    . Colonoscopy    . Esophagogastroduodenoscopy    . Ercp N/A 08/10/2012    Procedure: ENDOSCOPIC RETROGRADE CHOLANGIOPANCREATOGRAPHY (ERCP);  Surgeon: Louis Meckel, MD;  Location: [facility] OR;  Service: Gastroenterology;  Laterality: N/A;     Discharge Diagnosis: 1.   Acute epigastric abdominal pain following ERCP/sphincterotomy/bile duct dilatation/baloon sweep of bile duct. Did not rule in for ERCP pancreatitis.  Consultations:  None  Procedures Performed:  ERCP  08/10/12 Showed 2 large periampullary diverticula. The papilla was located eccentrically along the sidewall of the more proximal diverticulum.  CBD was diffusely dilated without stones, contrast emptied slowly and limited sphincterotomy was done. Minor amount of blood seen oozing after sphincterotomy. Bile duct baloon dilated after sphincterotomy.  A few stone fragments were swept with balloon.   Xrays/Imaging Dg Ercp Biliary & Pancreatic Ducts 08/10/2012    IMPRESSION: Mild dilatation of the biliary tree.  Please see discussion above.   Original Report Authenticated By: Leanna Battles, M.D.     Brief History: Brittany [name] is a 79 y.o. female. Hx  of Atrial fibrillation, on chronic Xarelto. Evaluated over several months for epigastric pain. EGD 03/2012 showed nonerosive gastroduodenitis. Pain persisted despite PPI, Carafate. She developed mild transaminase elevation with pain in early April 2014. MRI/MRCP 4/24 showed diffuse biliary ductal dilatation without mass or stone.  ERCP performed 08/10/12 showed 2 large periampullary diverticula. The papilla was located eccentrically along the sidewall of the more proximal diverticulum.  CBD was diffusely dilated without stones, contrast emptied slowly and limited sphincterotomy was done. Minor amount of blood seen oozing after sphincterotomy. Dr Arlyce Dice also dilated the bile duct after cutting sphincter and a few stone fragments were swept with balloon. He suspects  Ampullary stenosis.   In PACU following the ERCP she required small but repeated doses of Dilaudid and Zofran for c/o pain in epigastrum and nausea. Intensity of pain 10/10 at worst, eased to 3 to 5/10 after meds.  Pain was in same location, but quality was different than the pain she had experienced in recent months.  Because of ongoing pain, pt's advanced age and risk for post ERCP pancreatitis, Dr Arlyce Dice admitted her for observation, labs and supportive care.   Last took Xarelto on 4/30 in anticipation of procedure.    Hospital Course by problem list: 1.  Post ERCP abdominal pain.   Lipase measured 62, then 31 on repeat in AM.  This was not a sufficient degree of elevation to rule her in for pancreatitis and is not unusual following ERCP. Transaminases were also mildly elevated, again not unusual following biliary intervention.  She had ongoing complaints of epigastric abdominal pain quite similar to discomfort she had in the months prior to the procedure, Pain was managed with low dose Dilaudid. Over night the nausea resolved and she tolerated low fat diet which did not trigger any progression of the pain. She was deemed stable for discharge home.   Hospital follow up with Dr Arlyce Dice will be arranged and we asked her to call office in next few days to let him know how she is doing.  No additional meds were added and no meds were discontinued.  2.  Afib, chronic Xarelto.    She was reminded to restart on 08/15/12  Discharge Vitals:  BP 148/82  Pulse 85  Temp(Src) 98.3 F (36.8 C) (Oral)  Resp 17  Ht 5\' 1"  (1.549 m)  Wt 65.545 kg (144 lb 8 oz)  BMI 27.32 kg/m2  SpO2 90%  Discharge Labs:  Recent Labs   08/08/12 1401  08/10/12 1552  08/11/12 0516   WBC  14.0*  15.1*  14.9*   HGB  14.1  13.8  13.6   HCT  41.3  40.5  38.9   PLT  222  200  203    BMET   Recent Labs   08/08/12 1401  08/10/12 1552   NA  140  137   K  4.3  3.6   CL  100  100   CO2  30  26   GLUCOSE  80  113*    BUN  19  12   CREATININE  0.64  0.50   CALCIUM  9.7  8.8    LFT   Recent Labs   08/10/12 1552   PROT  7.1   ALBUMIN  3.4*   AST  67*   ALT  57*   ALKPHOS  78   BILITOT  0.7   Lipase:        62  31  Amylase      55   Disposition and follow-up:   Ms.Phoenyx A Rising was discharged from  in stable, healthy condition.    Diet at Discharge: Low fat.   Follow-up Appointments: Discharge Orders   Future Appointments Provider Department Dept Phone   09/19/2012 10:00 AM Nilda Riggs, NP [facility] [phone]   Future Orders Complete By Expires     Diet fat modified  As directed     Discontinue IV  As directed     Increase activity slowly  As directed        Discharge Medications:   Medication List    TAKE these medications       amLODipine 10 MG tablet  Commonly known as:  NORVASC  Take 5 mg by mouth daily.     atenolol 50 MG tablet  Commonly known as:  TENORMIN  Take 50 mg by mouth daily.     atorvastatin 20 MG tablet  Commonly known as:  LIPITOR  Take 20 mg by mouth daily.     cholecalciferol 1000 UNITS tablet  Commonly known as:  VITAMIN D  Take 1,000 Units by mouth daily.     diazepam 5 MG tablet  Commonly known as:  VALIUM  Take 5 mg by mouth every 6 (six) hours as needed (for back spasms).     FISH OIL PO  Take by mouth daily.     HYDROcodone-acetaminophen 5-325 MG per tablet  Commonly known as:  NORCO/VICODIN  Take 1 tablet by mouth daily as needed for pain.     hyoscyamine 0.125 MG SL tablet  Commonly known as:  LEVSIN SL  Place 0.25 mg under the tongue daily as needed for diarrhea or loose stools.     metoCLOPramide 10 MG tablet  Commonly known as:  REGLAN  Take 10 mg by mouth 4 (four) times daily.     oxybutynin 5 MG tablet  Commonly known as:  DITROPAN  Take 5 mg by mouth 3 (three) times daily.     pantoprazole 40 MG tablet  Commonly known as:  PROTONIX  Take 40 mg by mouth daily.      promethazine 25 MG tablet  Commonly known as:  PHENERGAN  Take 25 mg by mouth daily as needed for nausea.     Rivaroxaban 20 MG Tabs  Commonly known as:  XARELTO  Take 1 tablet (20 mg total) by mouth at bedtime. Resume medication in 5 days  Start taking on:  08/15/2012     sucralfate 1 GM/10ML suspension  Commonly known as:  CARAFATE  Take 1 g by mouth 2 (two) times daily.     traMADol 50 MG tablet  Commonly known as:  ULTRAM  Take 50 mg by mouth every 6 (six) hours as needed for pain.        Time Spent on discharge: 25 minutes   Signed: Kasandra Knudsen  [phone] 08/13/2012, 2:03 PM

## 2012-08-13 NOTE — Discharge Summary (Signed)
I saw the patient on day of dc and agree with this summary.

## 2012-08-23 ENCOUNTER — Telehealth: Payer: Self-pay | Admitting: Cardiovascular Disease

## 2012-08-23 NOTE — Telephone Encounter (Signed)
Pt informed she doesn't have to hold med for cleaning.

## 2012-08-23 NOTE — Telephone Encounter (Signed)
New Problem:    Patient called in because she has a tooth cleaning coming up and would like to know if she needs to stop her Rivaroxaban (XARELTO) 20 MG TABS.  Please call back.

## 2012-09-04 ENCOUNTER — Ambulatory Visit (INDEPENDENT_AMBULATORY_CARE_PROVIDER_SITE_OTHER): Payer: Medicare Other | Admitting: Gastroenterology

## 2012-09-04 ENCOUNTER — Encounter: Payer: Self-pay | Admitting: Gastroenterology

## 2012-09-04 VITALS — BP 112/78 | HR 76 | Ht 61.0 in | Wt 142.5 lb

## 2012-09-04 DIAGNOSIS — K831 Obstruction of bile duct: Secondary | ICD-10-CM

## 2012-09-04 DIAGNOSIS — K219 Gastro-esophageal reflux disease without esophagitis: Secondary | ICD-10-CM

## 2012-09-04 NOTE — Progress Notes (Signed)
History of Present Illness:  Brittany [name] has returned following ERCP with sphincterotomy and dilation of the papilla. It was felt that she probably has a biliary stenosis. Since the procedure she has been pain-free. Heretofore she was having pain on a daily basis.    Review of Systems: Pertinent positive and negative review of systems were noted in the above HPI section. All other review of systems were otherwise negative.    Current Medications, Allergies, Past Medical History, Past Surgical History, Family History and Social History were reviewed in [employer] electronic medical record  Vital signs were reviewed in today's medical record. Physical Exam: General: Well developed , well nourished, no acute distress

## 2012-09-04 NOTE — Assessment & Plan Note (Addendum)
This  was very likely the source for her abdominal pain. Now significantly improved following sphincterotomy and dilation of the papilla.

## 2012-09-04 NOTE — Patient Instructions (Addendum)
Discontinue Carafate and Reglan

## 2012-09-04 NOTE — Assessment & Plan Note (Signed)
Plan to continue his continue Reglan and Carafate and attempt to wean proton X.

## 2012-09-10 DIAGNOSIS — M542 Cervicalgia: Secondary | ICD-10-CM | POA: Diagnosis not present

## 2012-09-13 ENCOUNTER — Encounter (HOSPITAL_COMMUNITY): Payer: Self-pay | Admitting: Emergency Medicine

## 2012-09-13 ENCOUNTER — Emergency Department (HOSPITAL_COMMUNITY): Payer: Medicare Other

## 2012-09-13 ENCOUNTER — Emergency Department (HOSPITAL_COMMUNITY)
Admission: EM | Admit: 2012-09-13 | Discharge: 2012-09-13 | Disposition: A | Discharge status: Home / Self Care | Payer: Medicare Other | Attending: Emergency Medicine | Admitting: Emergency Medicine

## 2012-09-13 DIAGNOSIS — Z79899 Other long term (current) drug therapy: Secondary | ICD-10-CM | POA: Diagnosis not present

## 2012-09-13 DIAGNOSIS — Z8619 Personal history of other infectious and parasitic diseases: Secondary | ICD-10-CM | POA: Diagnosis not present

## 2012-09-13 DIAGNOSIS — Z872 Personal history of diseases of the skin and subcutaneous tissue: Secondary | ICD-10-CM | POA: Insufficient documentation

## 2012-09-13 DIAGNOSIS — Z8673 Personal history of transient ischemic attack (TIA), and cerebral infarction without residual deficits: Secondary | ICD-10-CM | POA: Diagnosis not present

## 2012-09-13 DIAGNOSIS — K219 Gastro-esophageal reflux disease without esophagitis: Secondary | ICD-10-CM | POA: Insufficient documentation

## 2012-09-13 DIAGNOSIS — Z8679 Personal history of other diseases of the circulatory system: Secondary | ICD-10-CM | POA: Insufficient documentation

## 2012-09-13 DIAGNOSIS — Z8719 Personal history of other diseases of the digestive system: Secondary | ICD-10-CM | POA: Diagnosis not present

## 2012-09-13 DIAGNOSIS — M47812 Spondylosis without myelopathy or radiculopathy, cervical region: Secondary | ICD-10-CM | POA: Diagnosis not present

## 2012-09-13 DIAGNOSIS — S139XXA Sprain of joints and ligaments of unspecified parts of neck, initial encounter: Secondary | ICD-10-CM | POA: Diagnosis not present

## 2012-09-13 DIAGNOSIS — Z87448 Personal history of other diseases of urinary system: Secondary | ICD-10-CM | POA: Insufficient documentation

## 2012-09-13 DIAGNOSIS — M4802 Spinal stenosis, cervical region: Secondary | ICD-10-CM | POA: Diagnosis not present

## 2012-09-13 DIAGNOSIS — I10 Essential (primary) hypertension: Secondary | ICD-10-CM | POA: Diagnosis not present

## 2012-09-13 DIAGNOSIS — Z860100 Personal history of colon polyps, unspecified: Secondary | ICD-10-CM | POA: Insufficient documentation

## 2012-09-13 DIAGNOSIS — F3289 Other specified depressive episodes: Secondary | ICD-10-CM | POA: Insufficient documentation

## 2012-09-13 DIAGNOSIS — Z87891 Personal history of nicotine dependence: Secondary | ICD-10-CM | POA: Insufficient documentation

## 2012-09-13 DIAGNOSIS — Z8614 Personal history of Methicillin resistant Staphylococcus aureus infection: Secondary | ICD-10-CM | POA: Insufficient documentation

## 2012-09-13 DIAGNOSIS — I4891 Unspecified atrial fibrillation: Secondary | ICD-10-CM | POA: Diagnosis not present

## 2012-09-13 DIAGNOSIS — F329 Major depressive disorder, single episode, unspecified: Secondary | ICD-10-CM | POA: Insufficient documentation

## 2012-09-13 DIAGNOSIS — S161XXA Strain of muscle, fascia and tendon at neck level, initial encounter: Secondary | ICD-10-CM

## 2012-09-13 DIAGNOSIS — Y9289 Other specified places as the place of occurrence of the external cause: Secondary | ICD-10-CM | POA: Insufficient documentation

## 2012-09-13 DIAGNOSIS — X500XXA Overexertion from strenuous movement or load, initial encounter: Secondary | ICD-10-CM | POA: Insufficient documentation

## 2012-09-13 DIAGNOSIS — Y9389 Activity, other specified: Secondary | ICD-10-CM | POA: Insufficient documentation

## 2012-09-13 DIAGNOSIS — Z8739 Personal history of other diseases of the musculoskeletal system and connective tissue: Secondary | ICD-10-CM | POA: Diagnosis not present

## 2012-09-13 MED ORDER — PREDNISONE 1 MG PO TABS: 5.0000 mg | ORAL_TABLET | Freq: Two times a day (BID) | ORAL | Status: DC

## 2012-09-13 MED ORDER — LORAZEPAM 2 MG/ML IJ SOLN
1.0000 mg | Freq: Once | INTRAMUSCULAR | Status: AC
  Administered 2012-09-13: 1 mg via INTRAVENOUS
  Filled 2012-09-13: qty 1

## 2012-09-13 MED ORDER — HYDROCODONE-ACETAMINOPHEN 5-325 MG PO TABS: 1.0000 | ORAL_TABLET | ORAL | Status: DC | PRN

## 2012-09-13 MED ORDER — KETOROLAC TROMETHAMINE 30 MG/ML IJ SOLN
30.0000 mg | Freq: Once | INTRAMUSCULAR | Status: AC
  Administered 2012-09-13: 30 mg via INTRAVENOUS
  Filled 2012-09-13: qty 1

## 2012-09-13 NOTE — ED Notes (Signed)
Per pt, also having B/L LE edema, 3+, which is new

## 2012-09-13 NOTE — ED Notes (Signed)
Per patient, hurt neck trying to unclog toliet-went to an after hours ortho clinic and had xray which was negative-given pain pills which are not helping-referred to ED for further eval

## 2012-09-18 DIAGNOSIS — M5030 Other cervical disc degeneration, unspecified cervical region: Secondary | ICD-10-CM | POA: Diagnosis not present

## 2012-09-18 DIAGNOSIS — M47812 Spondylosis without myelopathy or radiculopathy, cervical region: Secondary | ICD-10-CM | POA: Diagnosis not present

## 2012-09-18 DIAGNOSIS — M542 Cervicalgia: Secondary | ICD-10-CM | POA: Diagnosis not present

## 2012-09-19 ENCOUNTER — Ambulatory Visit: Payer: Self-pay | Admitting: Nurse Practitioner

## 2012-09-25 DIAGNOSIS — M25669 Stiffness of unspecified knee, not elsewhere classified: Secondary | ICD-10-CM | POA: Diagnosis not present

## 2012-09-27 DIAGNOSIS — M542 Cervicalgia: Secondary | ICD-10-CM | POA: Diagnosis not present

## 2012-09-28 NOTE — ED Provider Notes (Signed)
History     CSN: [id number]  Arrival date & time 09/13/12  1710   First MD Initiated Contact with Patient 09/13/12 1913      Chief Complaint  Patient presents with  . Neck Pain    (Consider location/radiation/quality/duration/timing/severity/associated sxs/prior treatment) HPI.... generalized neck pain after attempting to unclog a toilet. No radicular symptoms. Patient apparently went to an orthopedic clinic and had a negative x-ray of her cervical spine. Pain medication was prescribed. This does not seem to be helping much. Symptoms are mild to moderate. Positioning makes symptoms worse.  Past Medical History  Diagnosis Date  . Aortic stenosis     s/p tissue AVR '05  . Dyslipidemia     takes Lipitor daily  . Dizziness   . History of aortic stenosis   . Hyperplastic colon polyp   . Arthritis   . Depression   . Urine incontinence   . Diverticulosis   . Hx of gallstones   . IC (interstitial cystitis)   . Esophagitis   . Gastritis   . Hypertension     takes Amlodipine daily  . Stroke 2013    TIA-that has affected peripheral vision in right eye  . Joint pain   . Joint swelling   . Back pain     2 buldging disc  . Bruises easily   . GERD (gastroesophageal reflux disease)     takes Protonix daily  . Urinary frequency   . History of bladder infections   . Nausea     takes Reglan and Phenergan prn  . Back spasm     takes Valium prn  . History of staph infection     53yrs ago  . Cellulitis 2013    foot  . Atrial fibrillation     takes Atenolol daily and Xarelto  . Hiatal hernia   . Gallstones     Past Surgical History  Procedure Laterality Date  . Abdominal hysterectomy  1967  . Aortic valve replacement      2005 s/p [facility] pericardial tissue valve  . Appendectom;y  4098  . Cholecystectomy  1993  . Shoulder surgery      right d/t tear  . Back surgery      3 surgeries  . Vein ligation    . Tonsillectomy    . Appendectomy    . Cardiac  catheterization  2005  . Bilateral cataract surgeyr    . Dilation and curettage of uterus    . Neuroplasty / transposition median nerve at carpal tunnel bilateral    . Colonoscopy    . Esophagogastroduodenoscopy    . Ercp N/A 08/10/2012    Procedure: ENDOSCOPIC RETROGRADE CHOLANGIOPANCREATOGRAPHY (ERCP);  Surgeon: Louis Meckel, MD;  Location: [facility] OR;  Service: Gastroenterology;  Laterality: N/A;    Family History  Problem Relation Age of Onset  . Heart disease Father   . Coronary artery disease Brother   . Bipolar disorder Brother     commited suiside at 32yo  . Alcohol abuse Sister     sister #1  . Alcohol abuse Sister     sister #2  . Colon cancer Maternal Aunt   . Stomach cancer Maternal Grandmother     History  Substance Use Topics  . Smoking status: Former Smoker    Types: Cigarettes    Quit date: 03/06/1990  . Smokeless tobacco: Never Used     Comment: Quit 40 years ago  . Alcohol Use: No  OB History   Grav Para Term Preterm Abortions TAB SAB Ect Mult Living                  Review of Systems  All other systems reviewed and are negative.    Allergies  Amoxicillin; Cephalexin; Codeine; Lisinopril; Morphine and related; Neurontin; Nitrofurantoin monohyd macro; Prednisone; and Sulfa antibiotics  Home Medications   Current Outpatient Rx  Name  Route  Sig  Dispense  Refill  . amLODipine (NORVASC) 10 MG tablet   Oral   Take 5 mg by mouth daily.         Marland Kitchen atenolol (TENORMIN) 50 MG tablet   Oral   Take 50 mg by mouth daily.         Marland Kitchen atorvastatin (LIPITOR) 20 MG tablet   Oral   Take 20 mg by mouth daily.         . cholecalciferol (VITAMIN D) 1000 UNITS tablet   Oral   Take 1,000 Units by mouth daily.         . diazepam (VALIUM) 5 MG tablet   Oral   Take 5 mg by mouth every 6 (six) hours as needed (for back spasms).         Marland Kitchen HYDROcodone-acetaminophen (NORCO/VICODIN) 5-325 MG per tablet   Oral   Take 1 tablet by mouth daily as needed  for pain.         . hyoscyamine (LEVSIN SL) 0.125 MG SL tablet   Sublingual   Place 0.25 mg under the tongue daily as needed for diarrhea or loose stools.         . metoCLOPramide (REGLAN) 10 MG tablet   Oral   Take 10 mg by mouth 4 (four) times daily.         . Omega-3 Fatty Acids (FISH OIL PO)   Oral   Take by mouth daily.         Marland Kitchen oxybutynin (DITROPAN) 5 MG tablet   Oral   Take 5 mg by mouth 3 (three) times daily.         . pantoprazole (PROTONIX) 40 MG tablet   Oral   Take 40 mg by mouth daily.         . promethazine (PHENERGAN) 25 MG tablet   Oral   Take 25 mg by mouth daily as needed for nausea.         . Rivaroxaban (XARELTO) 20 MG TABS   Oral   Take 1 tablet (20 mg total) by mouth at bedtime. Resume medication in 5 days   30 tablet   1   . traMADol (ULTRAM) 50 MG tablet   Oral   Take 50 mg by mouth every 6 (six) hours as needed for pain.         Marland Kitchen HYDROcodone-acetaminophen (NORCO) 5-325 MG per tablet   Oral   Take 1 tablet by mouth every 4 (four) hours as needed for pain.   25 tablet   0   . predniSONE (DELTASONE) 1 MG tablet   Oral   Take 5 tablets (5 mg total) by mouth 2 (two) times daily.   20 tablet   0   . sucralfate (CARAFATE) 1 GM/10ML suspension   Oral   Take 1 g by mouth 2 (two) times daily.           BP 130/66  Pulse 92  Temp(Src) 98.8 F (37.1 C) (Oral)  Resp 16  Ht 5\' 1"  (1.549 m)  Wt 142 lb (64.411 kg)  BMI 26.84 kg/m2  SpO2 92%  Physical Exam  Nursing note and vitals reviewed. Constitutional: She is oriented to person, place, and time. She appears well-developed and well-nourished.  HENT:  Head: Normocephalic and atraumatic.  Eyes: Conjunctivae and EOM are normal. Pupils are equal, round, and reactive to light.  Neck: Normal range of motion.  Generalized posterior cervical tenderness  Cardiovascular: Normal rate, regular rhythm and normal heart sounds.   Pulmonary/Chest: Effort normal and breath sounds  normal.  Abdominal: Soft. Bowel sounds are normal.  Musculoskeletal: Normal range of motion.  Neurological: She is alert and oriented to person, place, and time.  Skin: Skin is warm and dry.  Psychiatric: She has a normal mood and affect.    ED Course  Procedures (including critical care time)  Labs Reviewed - No data to display No results found.   1. Cervical strain, initial encounter       MDM  No radicular symptoms. We'll discharge home on Vicodin #25 and prednisone for 5 days        Donnetta Hutching, MD 09/28/12 1058

## 2012-10-01 DIAGNOSIS — S9030XA Contusion of unspecified foot, initial encounter: Secondary | ICD-10-CM | POA: Diagnosis not present

## 2012-10-01 DIAGNOSIS — M47812 Spondylosis without myelopathy or radiculopathy, cervical region: Secondary | ICD-10-CM | POA: Diagnosis not present

## 2012-10-01 DIAGNOSIS — M542 Cervicalgia: Secondary | ICD-10-CM | POA: Diagnosis not present

## 2012-10-01 DIAGNOSIS — M25579 Pain in unspecified ankle and joints of unspecified foot: Secondary | ICD-10-CM | POA: Diagnosis not present

## 2012-10-05 DIAGNOSIS — M531 Cervicobrachial syndrome: Secondary | ICD-10-CM | POA: Diagnosis not present

## 2012-10-05 DIAGNOSIS — M47812 Spondylosis without myelopathy or radiculopathy, cervical region: Secondary | ICD-10-CM | POA: Diagnosis not present

## 2012-10-05 DIAGNOSIS — M542 Cervicalgia: Secondary | ICD-10-CM | POA: Diagnosis not present

## 2012-11-09 ENCOUNTER — Other Ambulatory Visit: Payer: Self-pay | Admitting: Gastroenterology

## 2012-11-14 DIAGNOSIS — N3010 Interstitial cystitis (chronic) without hematuria: Secondary | ICD-10-CM | POA: Diagnosis not present

## 2012-11-19 ENCOUNTER — Telehealth: Payer: Self-pay | Admitting: Cardiovascular Disease

## 2012-11-19 DIAGNOSIS — Z952 Presence of prosthetic heart valve: Secondary | ICD-10-CM | POA: Insufficient documentation

## 2012-11-19 NOTE — Telephone Encounter (Signed)
New Prob  Pt said she is having a lot of bruises come up on her body and she has not bumped into or hit anything that could cause it.

## 2012-11-19 NOTE — Telephone Encounter (Signed)
Pt was told she my switch to Eliquis 5 mg BID but it may cost more.  She also c/o ankle edema x 3-4 weeks, high sodium foods reviewed and she is avoiding. She states her edema is improved in the AM but soon it returns and by evening she has to remove her shoes due to edema.  App made with Darrel Hoover NP to evaluate, pt declines doing med switch by phone.

## 2012-11-20 DIAGNOSIS — N3010 Interstitial cystitis (chronic) without hematuria: Secondary | ICD-10-CM | POA: Diagnosis not present

## 2012-11-20 DIAGNOSIS — R3989 Other symptoms and signs involving the genitourinary system: Secondary | ICD-10-CM | POA: Diagnosis not present

## 2012-11-21 DIAGNOSIS — R609 Edema, unspecified: Secondary | ICD-10-CM | POA: Diagnosis not present

## 2012-11-21 DIAGNOSIS — I059 Rheumatic mitral valve disease, unspecified: Secondary | ICD-10-CM | POA: Diagnosis not present

## 2012-11-26 DIAGNOSIS — R3989 Other symptoms and signs involving the genitourinary system: Secondary | ICD-10-CM | POA: Diagnosis not present

## 2012-11-26 DIAGNOSIS — N3010 Interstitial cystitis (chronic) without hematuria: Secondary | ICD-10-CM | POA: Diagnosis not present

## 2012-12-07 DIAGNOSIS — N3010 Interstitial cystitis (chronic) without hematuria: Secondary | ICD-10-CM | POA: Diagnosis not present

## 2012-12-12 DIAGNOSIS — I059 Rheumatic mitral valve disease, unspecified: Secondary | ICD-10-CM | POA: Diagnosis not present

## 2012-12-14 ENCOUNTER — Ambulatory Visit: Payer: Medicare Other | Admitting: Nurse Practitioner

## 2012-12-14 DIAGNOSIS — N3010 Interstitial cystitis (chronic) without hematuria: Secondary | ICD-10-CM | POA: Diagnosis not present

## 2012-12-24 DIAGNOSIS — R51 Headache: Secondary | ICD-10-CM | POA: Diagnosis not present

## 2012-12-24 DIAGNOSIS — J301 Allergic rhinitis due to pollen: Secondary | ICD-10-CM | POA: Diagnosis not present

## 2012-12-24 DIAGNOSIS — I10 Essential (primary) hypertension: Secondary | ICD-10-CM | POA: Diagnosis not present

## 2013-01-10 ENCOUNTER — Encounter: Payer: Self-pay | Admitting: Cardiovascular Disease

## 2013-01-11 ENCOUNTER — Other Ambulatory Visit: Payer: Self-pay | Admitting: Gastroenterology

## 2013-01-17 ENCOUNTER — Encounter: Payer: Self-pay | Admitting: Cardiovascular Disease

## 2013-01-21 ENCOUNTER — Telehealth: Payer: Self-pay | Admitting: Cardiovascular Disease

## 2013-01-21 DIAGNOSIS — N3010 Interstitial cystitis (chronic) without hematuria: Secondary | ICD-10-CM | POA: Diagnosis not present

## 2013-01-21 DIAGNOSIS — R3129 Other microscopic hematuria: Secondary | ICD-10-CM | POA: Diagnosis not present

## 2013-01-21 DIAGNOSIS — N3941 Urge incontinence: Secondary | ICD-10-CM | POA: Diagnosis not present

## 2013-01-21 DIAGNOSIS — R3989 Other symptoms and signs involving the genitourinary system: Secondary | ICD-10-CM | POA: Diagnosis not present

## 2013-01-21 NOTE — Telephone Encounter (Signed)
Pt was seen by Dr Daleen Squibb last ov 02/08/12, advise how long xarelto may be held/ pt not on asa. Has app with Dr Elease Hashimoto next month.

## 2013-01-21 NOTE — Telephone Encounter (Signed)
She appears to be on Xarelto for atrial fib.   She has a bioprosthetic AV.  She has normal renal function.  She may hold her Xarelto for 2 days prior to procedure.

## 2013-01-21 NOTE — Telephone Encounter (Signed)
New problem   If they can hold Xarelto and for how long before the Cysto and hydrodistention of the bladder procedure. Please advise. And can fax to [phone].

## 2013-01-22 DIAGNOSIS — L570 Actinic keratosis: Secondary | ICD-10-CM | POA: Diagnosis not present

## 2013-01-22 NOTE — Telephone Encounter (Signed)
Taken to MR to be faxed. 

## 2013-01-25 ENCOUNTER — Other Ambulatory Visit: Payer: Self-pay | Admitting: Urology

## 2013-01-25 ENCOUNTER — Telehealth: Payer: Self-pay | Admitting: Cardiovascular Disease

## 2013-01-25 NOTE — Telephone Encounter (Signed)
New problem   Calling to check on the status to pt's cardiac clearance for her sx. Please advise.

## 2013-01-25 NOTE — Telephone Encounter (Signed)
Spoke with Chasity in [facility] . She is aware that the pt's clearance was faxed on 01/22/13.I will  re-faxed the note today again.

## 2013-01-28 MED ORDER — PHENAZOPYRIDINE HCL 200 MG PO TABS: Freq: Once | ORAL | Status: DC

## 2013-01-29 ENCOUNTER — Encounter (HOSPITAL_BASED_OUTPATIENT_CLINIC_OR_DEPARTMENT_OTHER): Payer: Self-pay | Admitting: *Deleted

## 2013-01-31 ENCOUNTER — Encounter (HOSPITAL_BASED_OUTPATIENT_CLINIC_OR_DEPARTMENT_OTHER): Payer: Self-pay | Admitting: *Deleted

## 2013-01-31 NOTE — Progress Notes (Addendum)
NPO AFTER MN.  NEEDS ISTAT AND EKG. WILL TAKE PRILOSEC AND REGLAN AM DOS W/ SIPS OF WATER, IF NEEDED MAY TAKE VALIUM AND RX PAIN (ONE TYPE, NOT BOTH).

## 2013-02-04 NOTE — H&P (Signed)
1. Allergy To Antibiotic Agents Penicillins V14.0 2. Bladder Pain 788.99 3. Chronic Interstitial Cystitis 595.1 4. Microscopic Hematuria 599.72 5. Nocturia 788.43 6. Postmenopausal Atrophic Vaginitis 627.3 7. Pyuria 791.9 8. Recent Weight Loss Involuntary Over Several Months 783.21 9. Stress Incontinence 788.39 10. Urge Incontinence Of Urine 788.31  History of Present Illness  Maela returns today in f/u.  She has a history of IC and had been on DMSO treatments of about 6 weeks in August and September with the last on 12/14/12.  She has not had relief of her pain which is in the low back and suprapubic area.   She has no pain in the earlier am but it worsens through the day.  She has had no gross hematuria but has microhematuria today.  She has no dysuria but it can be hard to start and require hesitancy.   She feels like she is full but she knows she is not because she just went.   She has nocturia q2hrs but less frequent during the day.  The pain is constant and not aggravated by a full bladder.  She remains on oxybutynin and has minimal leakage unless she delays voiding.  She has been requiring hydrocodone for the pain.   Past Medical History Problems  1. History of  Acute Cystitis 595.0 2. History of  Acute Hemorrhagic Cystitis 595.0 3. History of  Arthritis V13.4 4. History of  Endoscopic Injection Of Implant Material Into Bladder Neck 5. History of  Esophageal Reflux 530.81 6. History of  Gross Hematuria 599.71 7. History of  Gross Hematuria 599.71 8. History of  Hypercholesterolemia 272.0 9. History of  Hypertension 401.9 10. History of  Injection Of Trigger Point(S) 11. History of  Microscopic Hematuria 599.72 12. History of  Murmurs 785.2 13. History of  Peptic Ulcer V12.71 14. History of  Pyuria 791.9 15. History of  Stress Incontinence 788.39 16. History of  Transient Ischemic Attack 435.9 17. History of  Urinary Tract Infection V13.02  Surgical History Problems  1.  History of  Aortic Valve Replacement 2. History of  Back Surgery 3. History of  Bladder Surgery 4. History of  Cystoscopy With Dilation Of Bladder 5. History of  Hysterectomy 6. History of  Rotator Cuff Repair  Current Meds 1. Atenolol 100 MG Oral Tablet; Therapy: (Recorded:13Oct2014) to 2. Atorvastatin Calcium 20 MG Oral Tablet; Therapy: (Recorded:13Nov2013) to 3. Fish Oil CAPS; Therapy: (Recorded:19Aug2011) to 4. Lisinopril 20 MG Oral Tablet; Therapy: (Recorded:13Oct2014) to 5. Omeprazole 20 MG Oral Tablet Delayed Release; Therapy: (Recorded:13Oct2014) to 6. Oxybutynin Chloride 5 MG Oral Tablet; Therapy: (Recorded:26Feb2014) to 7. Pantoprazole Sodium 40 MG Oral Tablet Delayed Release; Therapy: 31Mar2014 to 8. Vitamin D3 1000 UNIT Oral Capsule; Therapy: (Recorded:08Aug2012) to 9. Xarelto 20 MG Oral Tablet; Therapy: 29Jul2013 to  Allergies Medication  1. PredniSONE TABS 2. Amoxicillin CAPS 3. Bactrim TABS 4. Codeine Derivatives 5. Keflex CAPS 6. Macrobid CAPS 7. Morphine Sulfate (PF) SOLN 8. Neurontin CAPS 9. Sulfa Drugs  Family History Problems  1. Paternal history of  Coronary Artery Disease 2. Fraternal history of  Coronary Artery Disease 3. Family history of  Death In The Family Father 4. Paternal history of  Death In The Family Father Age 43 5. Family history of  Death In The Family Mother 6. Maternal history of  Death In The Family Mother Age 15 7. Family history of  Family Health Status Number Of Children 3 sons  1 daughter 72. Family history of  Heart Disease V17.49  9. Maternal history of  Heart Disease V17.49 10. Paternal history of  Heart Disease V17.49  Social History Problems  1. Marital History - Widowed 2. Retired From Work 3. History of  Tobacco Use V15.82 1/2 pack for 41 yrs Denied  4. Alcohol Use 5. History of  Caffeine Use  Review of Systems  Gastrointestinal: no diarrhea and no constipation.  Constitutional: no fever and no recent weight  loss.  Cardiovascular: no chest pain and no leg swelling.  Respiratory: no shortness of breath.    Vitals Vital Signs [Data Includes: Last 1 Day]  13Oct2014 01:38PM  Blood Pressure: 175 / 99 Temperature: 98.6 F Heart Rate: 92  Physical Exam Constitutional: Well nourished and well developed . No acute distress.  Pulmonary: No respiratory distress and normal respiratory rhythm and effort.  Cardiovascular:. No peripheral edema. The rhythm was irregularly irregular.    Results/Data Urine [Data Includes: Last 1 Day]   13Oct2014  COLOR YELLOW   APPEARANCE CLEAR   SPECIFIC GRAVITY 1.015   pH 5.5   GLUCOSE NEG mg/dL  BILIRUBIN NEG   KETONE NEG mg/dL  BLOOD SMALL   PROTEIN NEG mg/dL  UROBILINOGEN 0.2 mg/dL  NITRITE NEG   LEUKOCYTE ESTERASE NEG   SQUAMOUS EPITHELIAL/HPF RARE   WBC 0-2 WBC/hpf  RBC 7-10 RBC/hpf  BACTERIA RARE   CRYSTALS NONE SEEN   CASTS NONE SEEN    Procedure  She had an instillation of lidocaine 2% 10cc and NaHCO3 8.4% 5cc with some improvement in her pain.     Assessment Assessed  1. Chronic Interstitial Cystitis 595.1 2. Bladder Pain 788.99 3. Microscopic Hematuria 599.72 4. Urge Incontinence Of Urine 788.31      She is having a flair of the IC symptoms although it is not clear that that is the cause of her pain but she did get relief with the cocktail..   Plan Chronic Interstitial Cystitis (595.1)  1. Anesthetic Cocktail  Done: 13Oct2014 2. Follow-up Schedule Surgery Office  Follow-up  Done: 13Oct2014 Chronic Interstitial Cystitis (595.1), Urge Incontinence Of Urine (788.31)  3. Amitriptyline HCl 10 MG Oral Tablet; TAKE 1 TABLET qHS; Therapy: 01Oct2008 to (Last  Rx:13Oct2014)  Requested for: 13Oct2014; [facility] Maintenance (V70.0)  4. UA With REFLEX  Done: 13Oct2014 12:50PM   I discussed the options for treatment and will start her on amitriptyline 10mg  nightly and get her set up for cystoscopy with HOD.  I have reviewed the risks of  bleeding, infection, bladder injury, difficulty voiding, thrombotic events and anesthetic complications.   She will have to go off of Xarelto and I will get cardiac clearance.   Discussion/Summary   CC: Dr. Cam Hai.

## 2013-02-05 ENCOUNTER — Encounter (HOSPITAL_BASED_OUTPATIENT_CLINIC_OR_DEPARTMENT_OTHER): Payer: Self-pay | Admitting: *Deleted

## 2013-02-05 ENCOUNTER — Encounter (HOSPITAL_BASED_OUTPATIENT_CLINIC_OR_DEPARTMENT_OTHER): Payer: Medicare Other | Admitting: Anesthesiology

## 2013-02-05 ENCOUNTER — Other Ambulatory Visit: Payer: Self-pay

## 2013-02-05 ENCOUNTER — Encounter (HOSPITAL_BASED_OUTPATIENT_CLINIC_OR_DEPARTMENT_OTHER)
Admission: RE | Disposition: A | Discharge status: Home / Self Care | Payer: Self-pay | Source: Ambulatory Visit | Attending: Urology

## 2013-02-05 ENCOUNTER — Ambulatory Visit (HOSPITAL_BASED_OUTPATIENT_CLINIC_OR_DEPARTMENT_OTHER): Payer: Medicare Other | Admitting: Anesthesiology

## 2013-02-05 ENCOUNTER — Ambulatory Visit (HOSPITAL_BASED_OUTPATIENT_CLINIC_OR_DEPARTMENT_OTHER)
Admission: RE | Admit: 2013-02-05 | Discharge: 2013-02-05 | Disposition: A | Discharge status: Home / Self Care | Payer: Medicare Other | Source: Ambulatory Visit | Attending: Urology | Admitting: Urology

## 2013-02-05 DIAGNOSIS — K219 Gastro-esophageal reflux disease without esophagitis: Secondary | ICD-10-CM | POA: Insufficient documentation

## 2013-02-05 DIAGNOSIS — Z87440 Personal history of urinary (tract) infections: Secondary | ICD-10-CM | POA: Insufficient documentation

## 2013-02-05 DIAGNOSIS — Z880 Allergy status to penicillin: Secondary | ICD-10-CM | POA: Insufficient documentation

## 2013-02-05 DIAGNOSIS — R3129 Other microscopic hematuria: Secondary | ICD-10-CM | POA: Insufficient documentation

## 2013-02-05 DIAGNOSIS — I10 Essential (primary) hypertension: Secondary | ICD-10-CM | POA: Diagnosis not present

## 2013-02-05 DIAGNOSIS — E7800 Pure hypercholesterolemia, unspecified: Secondary | ICD-10-CM | POA: Insufficient documentation

## 2013-02-05 DIAGNOSIS — N3010 Interstitial cystitis (chronic) without hematuria: Secondary | ICD-10-CM | POA: Diagnosis not present

## 2013-02-05 DIAGNOSIS — N393 Stress incontinence (female) (male): Secondary | ICD-10-CM | POA: Diagnosis not present

## 2013-02-05 DIAGNOSIS — Z8673 Personal history of transient ischemic attack (TIA), and cerebral infarction without residual deficits: Secondary | ICD-10-CM | POA: Insufficient documentation

## 2013-02-05 HISTORY — DX: Personal history of transient ischemic attack (TIA), and cerebral infarction without residual deficits: Z86.73

## 2013-02-05 HISTORY — DX: Dorsalgia, unspecified: M54.9

## 2013-02-05 HISTORY — DX: Other chronic pain: G89.29

## 2013-02-05 HISTORY — DX: Personal history of other diseases of the digestive system: Z87.19

## 2013-02-05 HISTORY — DX: Presence of prosthetic heart valve: Z95.2

## 2013-02-05 HISTORY — PX: CYSTO WITH HYDRODISTENSION: SHX5453

## 2013-02-05 HISTORY — DX: Foot drop, unspecified foot: M21.379

## 2013-02-05 HISTORY — DX: Personal history of other diseases of the circulatory system: Z86.79

## 2013-02-05 LAB — POCT I-STAT, CHEM 8
BUN: 12 mg/dL (ref 6–23)
Creatinine, Ser: 0.8 mg/dL (ref 0.50–1.10)
Glucose, Bld: 113 mg/dL — ABNORMAL HIGH (ref 70–99)
Hemoglobin: 14.3 g/dL (ref 12.0–15.0)
Potassium: 3.8 mEq/L (ref 3.5–5.1)
Sodium: 143 mEq/L (ref 135–145)

## 2013-02-05 SURGERY — CYSTOSCOPY, WITH BLADDER HYDRODISTENSION
Anesthesia: General

## 2013-02-05 MED ORDER — SODIUM CHLORIDE 0.9 % IJ SOLN
3.0000 mL | Freq: Two times a day (BID) | INTRAMUSCULAR | Status: DC
  Filled 2013-02-05: qty 3

## 2013-02-05 MED ORDER — HYDROCODONE-ACETAMINOPHEN 5-325 MG PO TABS: 1.0000 | ORAL_TABLET | ORAL | Status: DC | PRN

## 2013-02-05 MED ORDER — LIDOCAINE HCL (CARDIAC) 20 MG/ML IV SOLN
INTRAVENOUS | Status: DC | PRN
  Administered 2013-02-05: 50 mg via INTRAVENOUS

## 2013-02-05 MED ORDER — ACETAMINOPHEN 325 MG PO TABS
650.0000 mg | ORAL_TABLET | ORAL | Status: DC | PRN
  Filled 2013-02-05: qty 2

## 2013-02-05 MED ORDER — METOPROLOL TARTRATE 1 MG/ML IV SOLN
INTRAVENOUS | Status: DC | PRN
  Administered 2013-02-05: 5 mg via INTRAVENOUS

## 2013-02-05 MED ORDER — ONDANSETRON HCL 4 MG/2ML IJ SOLN
4.0000 mg | Freq: Four times a day (QID) | INTRAMUSCULAR | Status: DC | PRN
  Filled 2013-02-05: qty 2

## 2013-02-05 MED ORDER — FENTANYL CITRATE 0.05 MG/ML IJ SOLN
25.0000 ug | INTRAMUSCULAR | Status: DC | PRN
  Administered 2013-02-05 (×3): 25 ug via INTRAVENOUS
  Filled 2013-02-05: qty 1

## 2013-02-05 MED ORDER — PHENAZOPYRIDINE HCL 200 MG PO TABS: 200.0000 mg | ORAL_TABLET | Freq: Three times a day (TID) | ORAL | Status: DC | PRN

## 2013-02-05 MED ORDER — LACTATED RINGERS IV SOLN
INTRAVENOUS | Status: DC
  Filled 2013-02-05: qty 1000

## 2013-02-05 MED ORDER — ACETAMINOPHEN 650 MG RE SUPP
650.0000 mg | RECTAL | Status: DC | PRN
  Filled 2013-02-05: qty 1

## 2013-02-05 MED ORDER — FENTANYL CITRATE 0.05 MG/ML IJ SOLN
25.0000 ug | INTRAMUSCULAR | Status: DC | PRN
  Filled 2013-02-05: qty 1

## 2013-02-05 MED ORDER — HYDRALAZINE HCL 20 MG/ML IJ SOLN
5.0000 mg | INTRAMUSCULAR | Status: DC | PRN
  Administered 2013-02-05: 5 mg via INTRAVENOUS
  Filled 2013-02-05: qty 0.25

## 2013-02-05 MED ORDER — PROPOFOL 10 MG/ML IV BOLUS
INTRAVENOUS | Status: DC | PRN
  Administered 2013-02-05: 120 mg via INTRAVENOUS

## 2013-02-05 MED ORDER — CIPROFLOXACIN HCL 250 MG PO TABS: 250.0000 mg | ORAL_TABLET | Freq: Two times a day (BID) | ORAL | Status: DC

## 2013-02-05 MED ORDER — SODIUM CHLORIDE 0.9 % IV SOLN
250.0000 mL | INTRAVENOUS | Status: DC | PRN
  Filled 2013-02-05: qty 250

## 2013-02-05 MED ORDER — FENTANYL CITRATE 0.05 MG/ML IJ SOLN
INTRAMUSCULAR | Status: DC | PRN
  Administered 2013-02-05: 25 ug via INTRAVENOUS

## 2013-02-05 MED ORDER — LACTATED RINGERS IV SOLN
INTRAVENOUS | Status: DC
  Administered 2013-02-05: 10:00:00 via INTRAVENOUS
  Filled 2013-02-05: qty 1000

## 2013-02-05 MED ORDER — PHENAZOPYRIDINE HCL 200 MG PO TABS
ORAL | Status: DC | PRN
  Administered 2013-02-05: 11:00:00 via INTRAVESICAL

## 2013-02-05 MED ORDER — STERILE WATER FOR IRRIGATION IR SOLN
Status: DC | PRN
  Administered 2013-02-05: 3000 mL

## 2013-02-05 MED ORDER — ONDANSETRON HCL 4 MG/2ML IJ SOLN
INTRAMUSCULAR | Status: DC | PRN
  Administered 2013-02-05: 4 mg via INTRAVENOUS

## 2013-02-05 MED ORDER — PHENAZOPYRIDINE HCL 200 MG PO TABS
200.0000 mg | ORAL_TABLET | Freq: Once | ORAL | Status: AC
  Administered 2013-02-05: 200 mg via ORAL
  Filled 2013-02-05: qty 1

## 2013-02-05 MED ORDER — CIPROFLOXACIN IN D5W 400 MG/200ML IV SOLN
400.0000 mg | INTRAVENOUS | Status: AC
  Administered 2013-02-05: 400 mg via INTRAVENOUS
  Filled 2013-02-05: qty 200

## 2013-02-05 MED ORDER — SODIUM CHLORIDE 0.9 % IJ SOLN
3.0000 mL | INTRAMUSCULAR | Status: DC | PRN
  Filled 2013-02-05: qty 3

## 2013-02-05 MED ORDER — OXYCODONE HCL 5 MG PO TABS
5.0000 mg | ORAL_TABLET | ORAL | Status: DC | PRN
Start: 2013-02-05 — End: 2013-02-05
  Administered 2013-02-05: 5 mg via ORAL
  Filled 2013-02-05: qty 2

## 2013-02-05 MED ORDER — PROMETHAZINE HCL 25 MG/ML IJ SOLN
6.2500 mg | INTRAMUSCULAR | Status: DC | PRN
  Filled 2013-02-05: qty 1

## 2013-02-05 SURGICAL SUPPLY — 20 items
BAG DRAIN URO-CYSTO SKYTR STRL (DRAIN) ×2 IMPLANT
CANISTER SUCT LVC 12 LTR MEDI- (MISCELLANEOUS) ×2 IMPLANT
CATH COUDE FOLEY 2W 5CC 18FR (CATHETERS) ×2 IMPLANT
CATH ROBINSON RED A/P 16FR (CATHETERS) ×2 IMPLANT
CLOTH BEACON ORANGE TIMEOUT ST (SAFETY) ×2 IMPLANT
DRAPE CAMERA CLOSED 9X96 (DRAPES) ×2 IMPLANT
ELECT REM PT RETURN 9FT ADLT (ELECTROSURGICAL)
ELECTRODE REM PT RTRN 9FT ADLT (ELECTROSURGICAL) IMPLANT
GLOVE BIO SURGEON STRL SZ 6 (GLOVE) ×2 IMPLANT
GLOVE BIOGEL PI IND STRL 6.5 (GLOVE) ×1 IMPLANT
GLOVE BIOGEL PI INDICATOR 6.5 (GLOVE) ×1
GLOVE SURG SS PI 8.0 STRL IVOR (GLOVE) ×2 IMPLANT
GOWN STRL REIN XL XLG (GOWN DISPOSABLE) ×2 IMPLANT
NDL SAFETY ECLIPSE 18X1.5 (NEEDLE) ×1 IMPLANT
NEEDLE HYPO 18GX1.5 SHARP (NEEDLE) ×1
NS IRRIG 500ML POUR BTL (IV SOLUTION) IMPLANT
PACK CYSTOSCOPY (CUSTOM PROCEDURE TRAY) ×2 IMPLANT
PLUG CATH AND CAP STER (CATHETERS) ×2 IMPLANT
SYR 30ML LL (SYRINGE) ×2 IMPLANT
WATER STERILE IRR 3000ML UROMA (IV SOLUTION) ×2 IMPLANT

## 2013-02-05 NOTE — Brief Op Note (Signed)
02/05/2013  11:10 AM  PATIENT:  Brittany [name]  79 y.o. female  PRE-OPERATIVE DIAGNOSIS:  Interstitial Cystitis  POST-OPERATIVE DIAGNOSIS:  Interstitial cystitis  PROCEDURE:  Procedure(s): CYSTOSCOPY/HYDRODISTENSION (N/A) INSTILLATION OF PYRIDIUM AND MARCAINE  SURGEON:  Surgeon(s) and Role:    * Bjorn Pippin, MD - Primary  PHYSICIAN ASSISTANT:   ASSISTANTS: none   ANESTHESIA:   general  EBL:  Total I/O In: 100 [I.V.:100] Out: -   BLOOD ADMINISTERED:none  DRAINS: Urinary Catheter (Foley)   LOCAL MEDICATIONS USED:  NONE  SPECIMEN:  No Specimen  DISPOSITION OF SPECIMEN:  N/A  COUNTS:  YES  TOURNIQUET:  * No tourniquets in log *  DICTATION: .Other Dictation: Dictation Number [id number]  PLAN OF CARE: Discharge to home after PACU  PATIENT DISPOSITION:  PACU - hemodynamically stable.   Delay start of Pharmacological VTE agent (>24hrs) due to surgical blood loss or risk of bleeding: not applicable

## 2013-02-05 NOTE — Anesthesia Procedure Notes (Signed)
Procedure Name: LMA Insertion Date/Time: 02/05/2013 10:52 AM Performed by: Maris Berger T Pre-anesthesia Checklist: Patient identified, Emergency Drugs available, Suction available and Patient being monitored Patient Re-evaluated:Patient Re-evaluated prior to inductionOxygen Delivery Method: Circle System Utilized Preoxygenation: Pre-oxygenation with 100% oxygen Intubation Type: IV induction Ventilation: Mask ventilation without difficulty LMA: LMA inserted LMA Size: 4.0 Number of attempts: 1 Airway Equipment and Method: bite block Placement Confirmation: positive ETCO2 Dental Injury: Teeth and Oropharynx as per pre-operative assessment

## 2013-02-05 NOTE — Anesthesia Preprocedure Evaluation (Addendum)
Anesthesia Evaluation  Patient identified by MRN, date of birth, ID band Patient awake    Reviewed: Allergy & Precautions, H&P , NPO status , Patient's Chart, lab work & pertinent test results  Airway Mallampati: II TM Distance: >3 FB Neck ROM: Full    Dental  (+) Edentulous Upper, Edentulous Lower, Upper Dentures and Lower Dentures   Pulmonary neg pulmonary ROS, former smoker,  breath sounds clear to auscultation  Pulmonary exam normal       Cardiovascular hypertension, Pt. on medications and Pt. on home beta blockers negative cardio ROS  + dysrhythmias Atrial Fibrillation Rhythm:Regular Rate:Normal     Neuro/Psych PSYCHIATRIC DISORDERS Depression Chronic back pain  Neuromuscular disease CVA, Residual Symptoms negative neurological ROS  negative psych ROS   GI/Hepatic negative GI ROS, Neg liver ROS, hiatal hernia, GERD-  Medicated,  Endo/Other  negative endocrine ROS  Renal/GU negative Renal ROS   IC negative genitourinary   Musculoskeletal negative musculoskeletal ROS (+)   Abdominal   Peds negative pediatric ROS (+)  Hematology negative hematology ROS (+)   Anesthesia Other Findings Dentures on posts. Patient request that we leave denture in place.  Reproductive/Obstetrics                          Anesthesia Physical Anesthesia Plan  ASA: III  Anesthesia Plan: General   Post-op Pain Management:    Induction: Intravenous  Airway Management Planned: LMA  Additional Equipment:   Intra-op Plan:   Post-operative Plan: Extubation in OR  Informed Consent: I have reviewed the patients History and Physical, chart, labs and discussed the procedure including the risks, benefits and alternatives for the proposed anesthesia with the patient or authorized representative who has indicated his/her understanding and acceptance.   Dental advisory given  Plan Discussed with:  CRNA  Anesthesia Plan Comments:         Anesthesia Quick Evaluation

## 2013-02-05 NOTE — Transfer of Care (Addendum)
Immediate Anesthesia Transfer of Care Note  Patient: Brittany [name]  Procedure(s) Performed: Procedure(s): CYSTOSCOPY/HYDRODISTENSION (N/A)  Patient Location: PACU  Anesthesia Type:General  Level of Consciousness: sedated  Airway & Oxygen Therapy: Patient Spontanous Breathing and Patient connected to nasal cannula oxygen  Post-op Assessment: Report given to PACU RN  Post vital signs: B/P remains elevated  Complications: No apparent anesthesia complications

## 2013-02-05 NOTE — Anesthesia Postprocedure Evaluation (Signed)
Anesthesia Post Note  Patient: Brittany [name]  Procedure(s) Performed: Procedure(s) (LRB): CYSTOSCOPY/HYDRODISTENSION (N/A)  Anesthesia type: General  Patient location: PACU  Post pain: Pain level controlled  Post assessment: Post-op Vital signs reviewed  Last Vitals:  Filed Vitals:   02/05/13 1245  BP: 134/68  Pulse: 89  Temp:   Resp: 16    Post vital signs: Reviewed  Level of consciousness: sedated  Complications: No apparent anesthesia complications

## 2013-02-05 NOTE — Interval H&P Note (Signed)
History and Physical Interval Note:  02/05/2013 10:11 AM  Brittany [name]  has presented today for surgery, with the diagnosis of Interstitial Cystitis  The various methods of treatment have been discussed with the patient and family. After consideration of risks, benefits and other options for treatment, the patient has consented to  Procedure(s): CYSTOSCOPY/HYDRODISTENSION (N/A) as a surgical intervention .  The patient's history has been reviewed, patient examined, no change in status, stable for surgery.  I have reviewed the patient's chart and labs.  Questions were answered to the patient's satisfaction.     [name]

## 2013-02-06 ENCOUNTER — Encounter (HOSPITAL_BASED_OUTPATIENT_CLINIC_OR_DEPARTMENT_OTHER): Payer: Self-pay | Admitting: Urology

## 2013-02-06 NOTE — Op Note (Deleted)
Brittany [name], Brittany [name]               ACCOUNT NO.:  [id number]  MEDICAL RECORD NO.:  [id number]  LOCATION:                                 FACILITY:  PHYSICIAN:  Excell Seltzer. Annabell Howells, M.D.    DATE OF BIRTH:  1927/05/21  DATE OF PROCEDURE:  02/05/2013 DATE OF DISCHARGE:  02/05/2013                              OPERATIVE REPORT   PROCEDURE:  Cystoscopy with hydrodistention of the bladder, installation of Pyridium and Marcaine.  PREOPERATIVE DIAGNOSIS:  Interstitial cystitis with pain flare  POSTOPERATIVE DIAGNOSIS:  Interstitial cystitis with pain flare.  SURGEON:  Excell Seltzer. Annabell Howells, M.D.  ANESTHESIA:  General.  SPECIMENS:  None.  DRAINS:  An 18-French Foley catheter.  BLOOD LOSS:  None.  COMPLICATIONS:  None.  INDICATIONS:  Brittany [name] is an 79 year old white female with a long history of interstitial cystitis.  She has had increased bladder pain and selective hydrodistention.  FINDINGS OF PROCEDURE:  She was given Cipro and was taken to the operating room where general anesthetic was induced.  She was placed in lithotomy position.  Her perineum and genitalia were prepped with Betadine solution and she was draped in usual sterile fashion.  Cystoscopy was performed using a 22-French scope and 12-degree lens. Examination revealed some nodularity of the urethra consistent with her prior urethral bulking agent procedures and mild mucosal tearing at 12 o'clock at the bladder neck.  Inspection of the bladder revealed mild trabeculation.  No tumors or stones were noted.  The ureteral orifices were unremarkable.  After thorough inspection, the cystoscope was removed and an 18-French Foley catheter was inserted.  The balloon was filled with 10 mL of air and the bladder was then filled to capacity under 80 cm of water pressure.  Once the capacity was reached, the bladder was drained with a capacity under anesthesia of 1200 mL and bloody terminal efflux.  Inspection of the bladder  cystoscopically after hydrodistention revealed diffuse glomerulations on the posterior wall and a mucosal tear 2 cm in length was noted on the posterior wall, down to the muscle.  At this point, it was felt Foley catheter should be left in overnight, so the 18-French catheter was replaced.  The balloon was filled with 10 mL of sterile fluid.  The bladder was drained.  The bladder was then instilled with 30 mL of 0.25% Marcaine with 400 mg crushed Pyridium. The catheter was plugged.  This will be left indwelling 15 minutes before being placed to drainage.  The patient was taken down from lithotomy position.  Her anesthetic was reversed.  She was moved to recovery room in stable condition.  There were no complications.     Excell Seltzer. Annabell Howells, M.D.   ______________________________ Excell Seltzer. Annabell Howells, M.D.    JJW/MEDQ  D:  02/05/2013  T:  02/06/2013  Job:  [id number]

## 2013-02-06 NOTE — Op Note (Signed)
NAME:  Brittany [name], Brittany [name]               ACCOUNT NO.:  [id number]  MEDICAL RECORD NO.:  [id number]  LOCATION:                                 FACILITY:  PHYSICIAN:  Ricketta Colantonio J. Shimshon Narula, M.D.    DATE OF BIRTH:  05/26/1927  DATE OF PROCEDURE:  02/05/2013 DATE OF DISCHARGE:  02/05/2013                              OPERATIVE REPORT   PROCEDURE:  Cystoscopy with hydrodistention of the bladder, installation of Pyridium and Marcaine.  PREOPERATIVE DIAGNOSIS:  Interstitial cystitis with pain flare  POSTOPERATIVE DIAGNOSIS:  Interstitial cystitis with pain flare.  SURGEON:  Ralonda Tartt J. Jeffrie Stander, M.D.  ANESTHESIA:  General.  SPECIMENS:  None.  DRAINS:  An 18-French Foley catheter.  BLOOD LOSS:  None.  COMPLICATIONS:  None.  INDICATIONS:  [name] is an 79-year-old white female with a long history of interstitial cystitis.  She has had increased bladder pain and selective hydrodistention.  FINDINGS OF PROCEDURE:  She was given Cipro and was taken to the operating room where general anesthetic was induced.  She was placed in lithotomy position.  Her perineum and genitalia were prepped with Betadine solution and she was draped in usual sterile fashion.  Cystoscopy was performed using a 22-French scope and 12-degree lens. Examination revealed some nodularity of the urethra consistent with her prior urethral bulking agent procedures and mild mucosal tearing at 12 o'clock at the bladder neck.  Inspection of the bladder revealed mild trabeculation.  No tumors or stones were noted.  The ureteral orifices were unremarkable.  After thorough inspection, the cystoscope was removed and an 18-French Foley catheter was inserted.  The balloon was filled with 10 mL of air and the bladder was then filled to capacity under 80 cm of water pressure.  Once the capacity was reached, the bladder was drained with a capacity under anesthesia of 1200 mL and bloody terminal efflux.  Inspection of the bladder  cystoscopically after hydrodistention revealed diffuse glomerulations on the posterior wall and a mucosal tear 2 cm in length was noted on the posterior wall, down to the muscle.  At this point, it was felt Foley catheter should be left in overnight, so the 18-French catheter was replaced.  The balloon was filled with 10 mL of sterile fluid.  The bladder was drained.  The bladder was then instilled with 30 mL of 0.25% Marcaine with 400 mg crushed Pyridium. The catheter was plugged.  This will be left indwelling 15 minutes before being placed to drainage.  The patient was taken down from lithotomy position.  Her anesthetic was reversed.  She was moved to recovery room in stable condition.  There were no complications.     Jeananne Bedwell J. Genella Bas, M.D.   ______________________________ Macalister Arnaud J. Ekin Pilar, M.D.    JJW/MEDQ  D:  02/05/2013  T:  02/06/2013  Job:  [id number] 

## 2013-02-11 ENCOUNTER — Other Ambulatory Visit: Payer: Self-pay | Admitting: Gastroenterology

## 2013-02-11 ENCOUNTER — Other Ambulatory Visit: Payer: Self-pay | Admitting: Physician Assistant

## 2013-02-12 ENCOUNTER — Ambulatory Visit (INDEPENDENT_AMBULATORY_CARE_PROVIDER_SITE_OTHER): Payer: Medicare Other | Admitting: Cardiovascular Disease

## 2013-02-12 ENCOUNTER — Encounter: Payer: Self-pay | Admitting: Cardiovascular Disease

## 2013-02-12 VITALS — BP 130/78 | HR 79 | Ht 61.0 in | Wt 144.4 lb

## 2013-02-12 DIAGNOSIS — I350 Nonrheumatic aortic (valve) stenosis: Secondary | ICD-10-CM

## 2013-02-12 DIAGNOSIS — I359 Nonrheumatic aortic valve disorder, unspecified: Secondary | ICD-10-CM

## 2013-02-12 DIAGNOSIS — I4891 Unspecified atrial fibrillation: Secondary | ICD-10-CM | POA: Diagnosis not present

## 2013-02-12 NOTE — Patient Instructions (Signed)
Your physician wants you to follow-up in: 6 MONTHS WITH EKG You will receive a reminder letter in the mail two months in advance. If you don't receive a letter, please call our office to schedule the follow-up appointment.   Your physician recommends that you continue on your current medications as directed. Please refer to the Current Medication list given to you today.     

## 2013-02-12 NOTE — Assessment & Plan Note (Signed)
She is s/p aortic valve replacment.   Valve sound normal.  Will see her in 6 months.

## 2013-02-12 NOTE — Assessment & Plan Note (Signed)
Brittany [name] is doing well. She remains in atrial fibrillation. She's had a small stroke and has a right   lateral visual field defect. She is currently on atenolol and Xarelto.   Her rate is well controlled.

## 2013-02-12 NOTE — Progress Notes (Signed)
Brittany [name] Date of Birth  Oct 14, 1927       [facility]    [place] [address]  [address], [address] [place], [place]  [address]   [place], [place]  [address] [phone]     [phone]   Fax  [phone]    Fax [phone]  Problem List: 1. Aortic valve replacement 2. Dyslipidemia 3. Hypertension 4. 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     5. Atrial fibrillation  History of Present Illness:  Brittany [name] is an 79 yo with aortic valve replacement, HTN, and hyperlipidemia.  She has not had any cardiac complaints recently.  She complains of easy bruising on her arms.    She had an episode of severe heart burn this past week.    She's had a stroke over the past year. This has left her with some balance issues and some loss of peripheral vision.  Nov. 4, 2014:  Brittany [name] is doing well from a cardiac standpoint.  She is having problems with interstitial cystitis.  She had a procedure last week and had a small bladder tear which they are letting heal.  She is still having some significant pain.      Current Outpatient Prescriptions on File Prior to Visit  Medication Sig Dispense Refill  . atenolol (TENORMIN) 100 MG tablet Take 100 mg by mouth every evening.      Marland Kitchen atorvastatin (LIPITOR) 20 MG tablet Take 20 mg by mouth every evening.       . cholecalciferol (VITAMIN D) 1000 UNITS tablet Take 1,000 Units by mouth daily.      . diazepam (VALIUM) 5 MG tablet Take 5 mg by mouth every 6 (six) hours as needed (for back spasms).      Marland Kitchen HYDROcodone-acetaminophen (NORCO) 5-325 MG per tablet Take 1 tablet by mouth every 4 (four) hours as needed for pain.  30 tablet  0  . hyoscyamine (LEVSIN SL) 0.125 MG SL tablet Place 0.25 mg under the tongue daily as needed for diarrhea or loose stools.      Marland Kitchen lisinopril (PRINIVIL,ZESTRIL) 20 MG tablet Take 20 mg by mouth every evening.      . Omega-3 Fatty Acids (FISH OIL PO) Take 1 g by mouth  daily.       Marland Kitchen omeprazole (PRILOSEC) 20 MG capsule TAKE ONE CAPSULE BY MOUTH TWICE DAILY  60 capsule  0  . oxybutynin (DITROPAN) 5 MG tablet Take 5 mg by mouth 3 (three) times daily.      . pantoprazole (PROTONIX) 40 MG tablet TAKE 1 TABLET (40 MG TOTAL) BY MOUTH DAILY.  30 tablet  1  . promethazine (PHENERGAN) 25 MG tablet Take 1 tablet (25 mg total) by mouth every 6 (six) hours as needed for nausea.  30 tablet  0  . Rivaroxaban (XARELTO) 20 MG TABS tablet Take 20 mg by mouth at bedtime. PT LAST TO TAKE IS 02-01-2013 PRIOR TO PROCEDURE 02-05-2013      . sucralfate (CARAFATE) 1 GM/10ML suspension Take 1 g by mouth 2 (two) times daily as needed.        No current facility-administered medications on file prior to visit.    Allergies  Allergen Reactions  . Amoxicillin Diarrhea and Nausea And Vomiting  . Cephalexin Diarrhea  . Codeine Nausea Only  . Morphine And Related Other (See Comments)    HX ADDICTION / WITHDRAWAL  . Neurontin [Gabapentin] Swelling  Legs swell  . Nitrofurantoin Monohyd Macro Diarrhea and Nausea Only  . Prednisone Nausea Only  . Sulfa Antibiotics Rash    Past Medical History  Diagnosis Date  . Dyslipidemia     takes Lipitor daily  . Arthritis   . Urine incontinence   . Diverticulosis   . IC (interstitial cystitis)   . S/P aortic valve replacement     2005  . H/O hiatal hernia   . History of TIA (transient ischemic attack)     2013-- RESIDUAL PERIPHERAL VISION RIGHT EYE--  RESOLVED  . History of bacterial endocarditis   . Foot drop     SINCE 1987  . History of gastritis   . Chronic back pain   . Hypertension   . Atrial fibrillation   . GERD (gastroesophageal reflux disease)     Past Surgical History  Procedure Laterality Date  . Abdominal hysterectomy  1967  . Aortic valve replacement  09-29-2003     [facility] pericardial tissue valve  . Cholecystectomy  1993  . Vein ligation  1969    RIGHT LOWER LEG  . Dilation and curettage of  uterus    . Ercp N/A 08/10/2012    Procedure: ENDOSCOPIC RETROGRADE CHOLANGIOPANCREATOGRAPHY (ERCP);  Surgeon: Louis Meckel, MD;  Location: [facility] OR;  Service: Gastroenterology;  Laterality: N/A;  . Cataract extraction w/ intraocular lens  implant, bilateral    . Appendectomy  1933  . Cardiac catheterization  07-15-2003 DR HELEN PRESTON    MODERATE TO MODERATELY SEVERE CALCIFIC AORTIC STENOSIS/  NORMAL CORONARY ARTERIES  . Right shoulder arthroscopy w/ debridement rotator cuff and labral tear/ acrominoplasty/ distal clavicle excision/ ca ligament release  10-08-1999  . Cysto/ hydrodistention/ instillation clorpactin  MULTIPLE  last one 2009  . Mini-open right rotator cuff repair  07-12-2001  . Transvaginal tape procedure  07-30-2002  . Lumbar disc surgery  1985 &  2007  . Macroplastique urethral implantation  08-27-2009  . Transthoracic echocardiogram  08-10-2011    MILD LVH/  EF 55-60%/  NORMAL AVR TISSUE/ MILD MR/  MODERATE DILATED LA/  MILD DILATED RA  . Cardiovascular stress test  01-30-2012  DR NASHER    NORMAL NUCLEAR STUDY/  EF 73%/  NORMAL LVF  . Tonsillectomy and adenoidectomy    . Carpal tunnel release Bilateral   . Cysto with hydrodistension N/A 02/05/2013    Procedure: CYSTOSCOPY/HYDRODISTENSION;  Surgeon: Bjorn Pippin, MD;  Location: [facility];  Service: Urology;  Laterality: N/A;    History  Smoking status  . Former Smoker  . Types: Cigarettes  . Quit date: 03/06/1990  Smokeless tobacco  . Never Used    History  Alcohol Use No    Family History  Problem Relation Age of Onset  . Heart disease Father   . Coronary artery disease Brother   . Bipolar disorder Brother     commited suiside at 86yo  . Alcohol abuse Sister     sister #1  . Alcohol abuse Sister     sister #2  . Colon cancer Maternal Aunt   . Stomach cancer Maternal Grandmother     Reviw of Systems:  Reviewed in the HPI.  All other systems are negative.  Physical Exam: Blood  pressure 130/78, pulse 79, height 5\' 1"  (1.549 m), weight 144 lb 6.4 oz (65.499 kg). General: Well developed, well nourished, in no acute distress.  Head: Normocephalic, atraumatic, sclera non-icteric, mucus membranes are moist,   Neck: Supple. Carotids are  2 + without bruits. No JVD  Lungs: Clear bilaterally to auscultation.  Heart: irregularly irregular.  normal  S1 S2. No murmurs, gallops or rubs.  Abdomen: Soft, non-tender, non-distended with normal bowel sounds. No hepatomegaly. No rebound/guarding. No masses.  Msk:  Strength and tone are normal  Extremities: No clubbing or cyanosis. No edema.  Distal pedal pulses are 2+ and equal bilaterally.  Neuro: Alert and oriented X 3. Moves all extremities spontaneously.  Psych:  Responds to questions appropriately with a normal affect.  ECG: 11/07/11 - Atrial fibrillation with controlled rate.  Assessment / Plan:

## 2013-02-13 DIAGNOSIS — I69993 Ataxia following unspecified cerebrovascular disease: Secondary | ICD-10-CM | POA: Diagnosis not present

## 2013-02-13 DIAGNOSIS — I471 Supraventricular tachycardia: Secondary | ICD-10-CM | POA: Diagnosis not present

## 2013-02-13 DIAGNOSIS — E782 Mixed hyperlipidemia: Secondary | ICD-10-CM | POA: Diagnosis not present

## 2013-02-13 DIAGNOSIS — I10 Essential (primary) hypertension: Secondary | ICD-10-CM | POA: Diagnosis not present

## 2013-02-13 DIAGNOSIS — Z23 Encounter for immunization: Secondary | ICD-10-CM | POA: Diagnosis not present

## 2013-02-13 DIAGNOSIS — N3010 Interstitial cystitis (chronic) without hematuria: Secondary | ICD-10-CM | POA: Diagnosis not present

## 2013-02-13 DIAGNOSIS — Z0000 Encounter for general adult medical examination without abnormal findings: Secondary | ICD-10-CM | POA: Diagnosis not present

## 2013-02-13 DIAGNOSIS — K219 Gastro-esophageal reflux disease without esophagitis: Secondary | ICD-10-CM | POA: Diagnosis not present

## 2013-02-13 DIAGNOSIS — M899 Disorder of bone, unspecified: Secondary | ICD-10-CM | POA: Diagnosis not present

## 2013-02-20 DIAGNOSIS — N3010 Interstitial cystitis (chronic) without hematuria: Secondary | ICD-10-CM | POA: Diagnosis not present

## 2013-02-21 DIAGNOSIS — M25539 Pain in unspecified wrist: Secondary | ICD-10-CM | POA: Diagnosis not present

## 2013-03-11 ENCOUNTER — Other Ambulatory Visit: Payer: Self-pay | Admitting: Gastroenterology

## 2013-03-12 ENCOUNTER — Other Ambulatory Visit: Payer: Self-pay | Admitting: Gastroenterology

## 2013-03-15 DIAGNOSIS — R040 Epistaxis: Secondary | ICD-10-CM | POA: Diagnosis not present

## 2013-03-15 DIAGNOSIS — D65 Disseminated intravascular coagulation [defibrination syndrome]: Secondary | ICD-10-CM | POA: Diagnosis not present

## 2013-03-19 DIAGNOSIS — M545 Low back pain, unspecified: Secondary | ICD-10-CM | POA: Diagnosis not present

## 2013-03-19 DIAGNOSIS — IMO0002 Reserved for concepts with insufficient information to code with codable children: Secondary | ICD-10-CM | POA: Diagnosis not present

## 2013-03-26 DIAGNOSIS — D65 Disseminated intravascular coagulation [defibrination syndrome]: Secondary | ICD-10-CM | POA: Diagnosis not present

## 2013-03-26 DIAGNOSIS — R040 Epistaxis: Secondary | ICD-10-CM | POA: Diagnosis not present

## 2013-04-09 ENCOUNTER — Other Ambulatory Visit: Payer: Self-pay | Admitting: Gastroenterology

## 2013-04-18 DIAGNOSIS — M47812 Spondylosis without myelopathy or radiculopathy, cervical region: Secondary | ICD-10-CM | POA: Diagnosis not present

## 2013-04-18 DIAGNOSIS — IMO0002 Reserved for concepts with insufficient information to code with codable children: Secondary | ICD-10-CM | POA: Diagnosis not present

## 2013-04-18 DIAGNOSIS — M545 Low back pain, unspecified: Secondary | ICD-10-CM | POA: Diagnosis not present

## 2013-04-25 DIAGNOSIS — H40019 Open angle with borderline findings, low risk, unspecified eye: Secondary | ICD-10-CM | POA: Diagnosis not present

## 2013-04-25 DIAGNOSIS — D492 Neoplasm of unspecified behavior of bone, soft tissue, and skin: Secondary | ICD-10-CM | POA: Diagnosis not present

## 2013-04-25 DIAGNOSIS — H353190 Nonexudative age-related macular degeneration, unspecified eye, stage unspecified: Secondary | ICD-10-CM | POA: Diagnosis not present

## 2013-04-25 DIAGNOSIS — D3130 Benign neoplasm of unspecified choroid: Secondary | ICD-10-CM | POA: Diagnosis not present

## 2013-04-25 DIAGNOSIS — H43399 Other vitreous opacities, unspecified eye: Secondary | ICD-10-CM | POA: Diagnosis not present

## 2013-04-25 DIAGNOSIS — Z961 Presence of intraocular lens: Secondary | ICD-10-CM | POA: Diagnosis not present

## 2013-04-25 DIAGNOSIS — H524 Presbyopia: Secondary | ICD-10-CM | POA: Diagnosis not present

## 2013-05-02 DIAGNOSIS — M545 Low back pain, unspecified: Secondary | ICD-10-CM | POA: Diagnosis not present

## 2013-05-02 DIAGNOSIS — M47812 Spondylosis without myelopathy or radiculopathy, cervical region: Secondary | ICD-10-CM | POA: Diagnosis not present

## 2013-05-02 DIAGNOSIS — IMO0002 Reserved for concepts with insufficient information to code with codable children: Secondary | ICD-10-CM | POA: Diagnosis not present

## 2013-05-02 DIAGNOSIS — M542 Cervicalgia: Secondary | ICD-10-CM | POA: Diagnosis not present

## 2013-05-14 DIAGNOSIS — H903 Sensorineural hearing loss, bilateral: Secondary | ICD-10-CM | POA: Diagnosis not present

## 2013-05-16 DIAGNOSIS — N3010 Interstitial cystitis (chronic) without hematuria: Secondary | ICD-10-CM | POA: Diagnosis not present

## 2013-05-23 DIAGNOSIS — I4891 Unspecified atrial fibrillation: Secondary | ICD-10-CM | POA: Diagnosis not present

## 2013-05-23 DIAGNOSIS — J069 Acute upper respiratory infection, unspecified: Secondary | ICD-10-CM | POA: Diagnosis not present

## 2013-05-23 DIAGNOSIS — I471 Supraventricular tachycardia: Secondary | ICD-10-CM | POA: Diagnosis not present

## 2013-05-23 DIAGNOSIS — I10 Essential (primary) hypertension: Secondary | ICD-10-CM | POA: Diagnosis not present

## 2013-05-23 DIAGNOSIS — I69993 Ataxia following unspecified cerebrovascular disease: Secondary | ICD-10-CM | POA: Diagnosis not present

## 2013-05-30 ENCOUNTER — Other Ambulatory Visit: Payer: Self-pay | Admitting: Physician Assistant

## 2013-06-10 DIAGNOSIS — Z23 Encounter for immunization: Secondary | ICD-10-CM | POA: Diagnosis not present

## 2013-06-10 DIAGNOSIS — IMO0002 Reserved for concepts with insufficient information to code with codable children: Secondary | ICD-10-CM | POA: Diagnosis not present

## 2013-06-10 DIAGNOSIS — T148XXA Other injury of unspecified body region, initial encounter: Secondary | ICD-10-CM | POA: Diagnosis not present

## 2013-06-11 ENCOUNTER — Other Ambulatory Visit: Payer: Self-pay | Admitting: Gastroenterology

## 2013-06-28 DIAGNOSIS — Z1231 Encounter for screening mammogram for malignant neoplasm of breast: Secondary | ICD-10-CM | POA: Diagnosis not present

## 2013-07-01 DIAGNOSIS — IMO0002 Reserved for concepts with insufficient information to code with codable children: Secondary | ICD-10-CM | POA: Diagnosis not present

## 2013-07-01 DIAGNOSIS — M545 Low back pain, unspecified: Secondary | ICD-10-CM | POA: Diagnosis not present

## 2013-07-03 DIAGNOSIS — N3010 Interstitial cystitis (chronic) without hematuria: Secondary | ICD-10-CM | POA: Diagnosis not present

## 2013-07-10 DIAGNOSIS — N3010 Interstitial cystitis (chronic) without hematuria: Secondary | ICD-10-CM | POA: Diagnosis not present

## 2013-07-17 DIAGNOSIS — N3010 Interstitial cystitis (chronic) without hematuria: Secondary | ICD-10-CM | POA: Diagnosis not present

## 2013-07-22 DIAGNOSIS — M25549 Pain in joints of unspecified hand: Secondary | ICD-10-CM | POA: Diagnosis not present

## 2013-07-25 DIAGNOSIS — N3010 Interstitial cystitis (chronic) without hematuria: Secondary | ICD-10-CM | POA: Diagnosis not present

## 2013-07-29 DIAGNOSIS — M25549 Pain in joints of unspecified hand: Secondary | ICD-10-CM | POA: Diagnosis not present

## 2013-07-30 ENCOUNTER — Telehealth: Payer: Self-pay | Admitting: Cardiovascular Disease

## 2013-07-30 DIAGNOSIS — IMO0002 Reserved for concepts with insufficient information to code with codable children: Secondary | ICD-10-CM | POA: Diagnosis not present

## 2013-07-30 DIAGNOSIS — M47812 Spondylosis without myelopathy or radiculopathy, cervical region: Secondary | ICD-10-CM | POA: Diagnosis not present

## 2013-07-30 DIAGNOSIS — M545 Low back pain, unspecified: Secondary | ICD-10-CM | POA: Diagnosis not present

## 2013-07-30 DIAGNOSIS — M542 Cervicalgia: Secondary | ICD-10-CM | POA: Diagnosis not present

## 2013-07-30 NOTE — Telephone Encounter (Signed)
Spoke with patient who states she is bruising easily and is concerned that her dose of Xarelto may need to be reduced.  Patient states Dr. Nicholes Stairs saw her hand recently after she noticed a large hematoma pop up when she had not injured the hand and he advised patient to notify her cardiologist.  Patient states she has bruising on various parts of her body at random times, seemingly without injury.  I advised patient that Dr. Acie Fredrickson is not in the office this afternoon, but that I will forward message to him for follow-up tomorrow.  Patient verbalized understanding and gratitude for a quick response.

## 2013-07-30 NOTE — Telephone Encounter (Signed)
New message     Pt is on xarelto.  Patient says her body is "one complete big burise".  She want to talk about adjusting her medication.

## 2013-07-31 NOTE — Telephone Encounter (Signed)
Spoke with patient and reviewed Dr. Elmarie Shiley advice.  Patient states she will remain on Xarelto for the time and would like to discuss further with him at next office visit which is due in May.  I scheduled patient's appointment and advised her to call back if she has further questions or concerns or notices increased bleeding/bruising.  Patient verbalized understanding and agreement.

## 2013-07-31 NOTE — Telephone Encounter (Signed)
Her renal function is normal.  I think the xarelto 20 dose is correct for her. She can try Eliquis 5 mg BID and see if that is better. I will send a copy of this to Pharmacy to verify my dosing

## 2013-08-01 ENCOUNTER — Other Ambulatory Visit: Payer: Self-pay | Admitting: Physician Assistant

## 2013-08-01 DIAGNOSIS — N390 Urinary tract infection, site not specified: Secondary | ICD-10-CM | POA: Diagnosis not present

## 2013-08-01 DIAGNOSIS — R82998 Other abnormal findings in urine: Secondary | ICD-10-CM | POA: Diagnosis not present

## 2013-08-05 ENCOUNTER — Telehealth: Payer: Self-pay | Admitting: Gastroenterology

## 2013-08-06 MED ORDER — PROMETHAZINE HCL 25 MG PO TABS: 25.0000 mg | ORAL_TABLET | Freq: Four times a day (QID) | ORAL | Status: DC | PRN

## 2013-08-06 NOTE — Telephone Encounter (Signed)
FYI.Marland KitchenMarland KitchenMarland KitchenRefilled phenergan for pt

## 2013-08-06 NOTE — Telephone Encounter (Signed)
Okay 

## 2013-08-08 ENCOUNTER — Other Ambulatory Visit: Payer: Self-pay | Admitting: Gastroenterology

## 2013-08-13 DIAGNOSIS — N3010 Interstitial cystitis (chronic) without hematuria: Secondary | ICD-10-CM | POA: Diagnosis not present

## 2013-08-15 DIAGNOSIS — L02519 Cutaneous abscess of unspecified hand: Secondary | ICD-10-CM | POA: Diagnosis not present

## 2013-08-15 DIAGNOSIS — L03119 Cellulitis of unspecified part of limb: Secondary | ICD-10-CM | POA: Diagnosis not present

## 2013-08-15 DIAGNOSIS — N3010 Interstitial cystitis (chronic) without hematuria: Secondary | ICD-10-CM | POA: Diagnosis not present

## 2013-08-20 DIAGNOSIS — N3010 Interstitial cystitis (chronic) without hematuria: Secondary | ICD-10-CM | POA: Diagnosis not present

## 2013-08-26 DIAGNOSIS — N3010 Interstitial cystitis (chronic) without hematuria: Secondary | ICD-10-CM | POA: Diagnosis not present

## 2013-08-27 ENCOUNTER — Encounter: Payer: Self-pay | Admitting: Cardiovascular Disease

## 2013-08-27 ENCOUNTER — Ambulatory Visit (INDEPENDENT_AMBULATORY_CARE_PROVIDER_SITE_OTHER): Payer: Medicare Other | Admitting: Cardiovascular Disease

## 2013-08-27 VITALS — BP 144/75 | HR 65 | Ht 61.0 in | Wt 147.0 lb

## 2013-08-27 DIAGNOSIS — I359 Nonrheumatic aortic valve disorder, unspecified: Secondary | ICD-10-CM | POA: Diagnosis not present

## 2013-08-27 DIAGNOSIS — I350 Nonrheumatic aortic (valve) stenosis: Secondary | ICD-10-CM

## 2013-08-27 DIAGNOSIS — I10 Essential (primary) hypertension: Secondary | ICD-10-CM

## 2013-08-27 NOTE — Progress Notes (Signed)
Brittany [name] Date of Birth  May 12, 1927       [facility]    [employer] [address], [place], [place]  [address]   [place], [place]  [address] [phone]     [phone]   Fax  [phone]    Fax [phone]  Problem List: 1. Aortic valve replacement 2. Dyslipidemia 3. Hypertension 4. 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     5. Atrial fibrillation  History of Present Illness:  [name] is an 80 yo with aortic valve replacement, HTN, and hyperlipidemia.  She has not had any cardiac complaints recently.  She complains of easy bruising on her arms.    She had an episode of severe heart burn this past week.    She's had a stroke over the past year. This has left her with some balance issues and some loss of peripheral vision.  Nov. 4, 2014:  [name] is doing well from a cardiac standpoint.  She is having problems with interstitial cystitis.  She had a procedure last week and had a small bladder tear which they are letting heal.  She is still having some significant pain.    Aug 27, 2013:  [name] is doing OK.  She is not having any problems .  Still very active.   She had an AVR   She is having problems with interstitial cyctitis.      Current Outpatient Prescriptions on File Prior to Visit  Medication Sig Dispense Refill  . atenolol (TENORMIN) 100 MG tablet Take 100 mg by mouth every evening.      Marland Kitchen atorvastatin (LIPITOR) 20 MG tablet Take 20 mg by mouth every evening.       . cholecalciferol (VITAMIN D) 1000 UNITS tablet Take 1,000 Units by mouth daily.      . diazepam (VALIUM) 5 MG tablet Take 5 mg by mouth every 6 (six) hours as needed (for back spasms).      . hyoscyamine (LEVSIN SL) 0.125 MG SL tablet Place 0.25 mg under the tongue daily as needed for diarrhea or loose stools.      Marland Kitchen lisinopril (PRINIVIL,ZESTRIL) 20 MG tablet Take 20 mg by mouth every evening.      . Multiple  Vitamins-Minerals (MULTIVITAMIN WITH MINERALS) tablet Take 1 tablet by mouth daily.      . Omega-3 Fatty Acids (FISH OIL PO) Take 1 g by mouth daily.       Marland Kitchen omeprazole (PRILOSEC) 20 MG capsule TAKE ONE CAPSULE BY MOUTH TWICE DAILY  60 capsule  3  . oxybutynin (DITROPAN) 5 MG tablet Take 5 mg by mouth 3 (three) times daily.      . pantoprazole (PROTONIX) 40 MG tablet TAKE 1 TABLET (40 MG TOTAL) BY MOUTH DAILY.  30 tablet  3  . Rivaroxaban (XARELTO) 20 MG TABS tablet Take 20 mg by mouth at bedtime. PT LAST TO TAKE IS 78-24-2014 PRIOR TO PROCEDURE 02-05-2013       No current facility-administered medications on file prior to visit.    Allergies  Allergen Reactions  . Amoxicillin Diarrhea and Nausea And Vomiting  . Cephalexin Diarrhea  . Codeine Nausea Only  . Morphine And Related Other (See Comments)    HX ADDICTION / WITHDRAWAL  . Neurontin [Gabapentin] Swelling    Legs swell  . Nitrofurantoin Monohyd Macro Diarrhea and Nausea Only  . Prednisone Nausea Only  .  Sulfa Antibiotics Rash    Past Medical History  Diagnosis Date  . Dyslipidemia     takes Lipitor daily  . Arthritis   . Urine incontinence   . Diverticulosis   . IC (interstitial cystitis)   . S/P aortic valve replacement     2005  . H/O hiatal hernia   . History of TIA (transient ischemic attack)     2013-- RESIDUAL PERIPHERAL VISION RIGHT EYE--  RESOLVED  . History of bacterial endocarditis   . Foot drop     SINCE 1987  . History of gastritis   . Chronic back pain   . Hypertension   . Atrial fibrillation   . GERD (gastroesophageal reflux disease)     Past Surgical History  Procedure Laterality Date  . Abdominal hysterectomy  1967  . Aortic valve replacement  09-29-2003     [facility] pericardial tissue valve  . Cholecystectomy  1993  . Vein ligation  1969    RIGHT LOWER LEG  . Dilation and curettage of uterus    . Ercp N/A 08/10/2012    Procedure: ENDOSCOPIC RETROGRADE CHOLANGIOPANCREATOGRAPHY  (ERCP);  Surgeon: Inda Castle, MD;  Location: [place];  Service: Gastroenterology;  Laterality: N/A;  . Cataract extraction w/ intraocular lens  implant, bilateral    . Appendectomy  1933  . Cardiac catheterization  07-15-2003 DR HELEN PRESTON    MODERATE TO MODERATELY SEVERE CALCIFIC AORTIC STENOSIS/  NORMAL CORONARY ARTERIES  . Right shoulder arthroscopy w/ debridement rotator cuff and labral tear/ acrominoplasty/ distal clavicle excision/ ca ligament release  10-08-1999  . Cysto/ hydrodistention/ instillation clorpactin  MULTIPLE  last one 2009  . Mini-open right rotator cuff repair  07-12-2001  . Transvaginal tape procedure  07-30-2002  . Lumbar disc surgery  1985 &  2007  . Macroplastique urethral implantation  08-27-2009  . Transthoracic echocardiogram  08-10-2011    MILD LVH/  EF 55-60%/  NORMAL AVR TISSUE/ MILD MR/  MODERATE DILATED LA/  MILD DILATED RA  . Cardiovascular stress test  01-30-2012  DR NASHER    NORMAL NUCLEAR STUDY/  EF 73%/  NORMAL LVF  . Tonsillectomy and adenoidectomy    . Carpal tunnel release Bilateral   . Cysto with hydrodistension N/A 02/05/2013    Procedure: CYSTOSCOPY/HYDRODISTENSION;  Surgeon: Irine Seal, MD;  Location: [facility];  Service: Urology;  Laterality: N/A;    History  Smoking status  . Former Smoker  . Types: Cigarettes  . Quit date: 03/06/1990  Smokeless tobacco  . Never Used    History  Alcohol Use No    Family History  Problem Relation Age of Onset  . Heart disease Father   . Coronary artery disease Brother   . Bipolar disorder Brother     commited suiside at 78yo  . Alcohol abuse Sister     sister #1  . Alcohol abuse Sister     sister #2  . Colon cancer Maternal Aunt   . Stomach cancer Maternal Grandmother     Reviw of Systems:  Reviewed in the HPI.  All other systems are negative.  Physical Exam: Blood pressure 144/75, pulse 65, height 5\' 1"  (1.549 m), weight 147 lb (66.679 kg). General: Well  developed, well nourished, in no acute distress.  Head: Normocephalic, atraumatic, sclera non-icteric, mucus membranes are moist,   Neck: Supple. Carotids are 2 + without bruits. No JVD  Lungs: Clear bilaterally to auscultation.  Heart: irregularly irregular.  normal  S1  S2. No murmurs, gallops or rubs.  Abdomen: Soft, non-tender, non-distended with normal bowel sounds. No hepatomegaly. No rebound/guarding. No masses.  Msk:  Strength and tone are normal  Extremities: No clubbing or cyanosis. No edema.  Distal pedal pulses are 2+ and equal bilaterally.  Neuro: Alert and oriented X 3. Moves all extremities spontaneously.  Psych:  Responds to questions appropriately with a normal affect.  ECG: 11/07/11 - Atrial fibrillation with controlled rate.  Assessment / Plan:

## 2013-08-27 NOTE — Assessment & Plan Note (Signed)
She is s/p bioprosthetic AVR.   She may need to have a cyctoscopy.  She should take abx prior to procedure.

## 2013-08-27 NOTE — Patient Instructions (Signed)
Your physician recommends that you continue on your current medications as directed. Please refer to the Current Medication list given to you today.  Your physician wants you to follow-up in: 1 year with Dr. Nahser.  You will receive a reminder letter in the mail two months in advance. If you don't receive a letter, please call our office to schedule the follow-up appointment.  

## 2013-08-27 NOTE — Assessment & Plan Note (Signed)
BP is ok 

## 2013-08-28 DIAGNOSIS — N3010 Interstitial cystitis (chronic) without hematuria: Secondary | ICD-10-CM | POA: Diagnosis not present

## 2013-08-28 DIAGNOSIS — N949 Unspecified condition associated with female genital organs and menstrual cycle: Secondary | ICD-10-CM | POA: Diagnosis not present

## 2013-08-28 DIAGNOSIS — R3129 Other microscopic hematuria: Secondary | ICD-10-CM | POA: Diagnosis not present

## 2013-08-28 DIAGNOSIS — N3941 Urge incontinence: Secondary | ICD-10-CM | POA: Diagnosis not present

## 2013-09-11 DIAGNOSIS — R279 Unspecified lack of coordination: Secondary | ICD-10-CM | POA: Diagnosis not present

## 2013-09-11 DIAGNOSIS — M6281 Muscle weakness (generalized): Secondary | ICD-10-CM | POA: Diagnosis not present

## 2013-09-11 DIAGNOSIS — N3946 Mixed incontinence: Secondary | ICD-10-CM | POA: Diagnosis not present

## 2013-09-11 DIAGNOSIS — M62838 Other muscle spasm: Secondary | ICD-10-CM | POA: Diagnosis not present

## 2013-09-12 DIAGNOSIS — N949 Unspecified condition associated with female genital organs and menstrual cycle: Secondary | ICD-10-CM | POA: Diagnosis not present

## 2013-09-12 DIAGNOSIS — R3989 Other symptoms and signs involving the genitourinary system: Secondary | ICD-10-CM | POA: Diagnosis not present

## 2013-09-12 DIAGNOSIS — N3010 Interstitial cystitis (chronic) without hematuria: Secondary | ICD-10-CM | POA: Diagnosis not present

## 2013-09-12 DIAGNOSIS — N3946 Mixed incontinence: Secondary | ICD-10-CM | POA: Diagnosis not present

## 2013-09-19 DIAGNOSIS — N3941 Urge incontinence: Secondary | ICD-10-CM | POA: Diagnosis not present

## 2013-09-24 DIAGNOSIS — H40019 Open angle with borderline findings, low risk, unspecified eye: Secondary | ICD-10-CM | POA: Diagnosis not present

## 2013-09-25 DIAGNOSIS — N3946 Mixed incontinence: Secondary | ICD-10-CM | POA: Diagnosis not present

## 2013-09-25 DIAGNOSIS — R279 Unspecified lack of coordination: Secondary | ICD-10-CM | POA: Diagnosis not present

## 2013-09-25 DIAGNOSIS — M6281 Muscle weakness (generalized): Secondary | ICD-10-CM | POA: Diagnosis not present

## 2013-09-25 DIAGNOSIS — M62838 Other muscle spasm: Secondary | ICD-10-CM | POA: Diagnosis not present

## 2013-09-26 DIAGNOSIS — N3941 Urge incontinence: Secondary | ICD-10-CM | POA: Diagnosis not present

## 2013-09-27 DIAGNOSIS — M6281 Muscle weakness (generalized): Secondary | ICD-10-CM | POA: Diagnosis not present

## 2013-09-27 DIAGNOSIS — M62838 Other muscle spasm: Secondary | ICD-10-CM | POA: Diagnosis not present

## 2013-09-27 DIAGNOSIS — N3946 Mixed incontinence: Secondary | ICD-10-CM | POA: Diagnosis not present

## 2013-09-27 DIAGNOSIS — R279 Unspecified lack of coordination: Secondary | ICD-10-CM | POA: Diagnosis not present

## 2013-09-30 DIAGNOSIS — D485 Neoplasm of uncertain behavior of skin: Secondary | ICD-10-CM | POA: Diagnosis not present

## 2013-10-01 DIAGNOSIS — M6281 Muscle weakness (generalized): Secondary | ICD-10-CM | POA: Diagnosis not present

## 2013-10-01 DIAGNOSIS — M62838 Other muscle spasm: Secondary | ICD-10-CM | POA: Diagnosis not present

## 2013-10-01 DIAGNOSIS — N3946 Mixed incontinence: Secondary | ICD-10-CM | POA: Diagnosis not present

## 2013-10-01 DIAGNOSIS — R279 Unspecified lack of coordination: Secondary | ICD-10-CM | POA: Diagnosis not present

## 2013-10-03 DIAGNOSIS — N3941 Urge incontinence: Secondary | ICD-10-CM | POA: Diagnosis not present

## 2013-10-04 DIAGNOSIS — M6281 Muscle weakness (generalized): Secondary | ICD-10-CM | POA: Diagnosis not present

## 2013-10-04 DIAGNOSIS — R279 Unspecified lack of coordination: Secondary | ICD-10-CM | POA: Diagnosis not present

## 2013-10-04 DIAGNOSIS — M62838 Other muscle spasm: Secondary | ICD-10-CM | POA: Diagnosis not present

## 2013-10-04 DIAGNOSIS — N3946 Mixed incontinence: Secondary | ICD-10-CM | POA: Diagnosis not present

## 2013-10-09 DIAGNOSIS — I272 Pulmonary hypertension, unspecified: Secondary | ICD-10-CM

## 2013-10-09 DIAGNOSIS — R51 Headache: Secondary | ICD-10-CM | POA: Diagnosis not present

## 2013-10-09 DIAGNOSIS — I10 Essential (primary) hypertension: Secondary | ICD-10-CM | POA: Diagnosis not present

## 2013-10-09 HISTORY — DX: Pulmonary hypertension, unspecified: I27.20

## 2013-10-10 ENCOUNTER — Encounter (HOSPITAL_COMMUNITY): Payer: Self-pay | Admitting: Emergency Medicine

## 2013-10-10 ENCOUNTER — Emergency Department (HOSPITAL_COMMUNITY)
Admission: EM | Admit: 2013-10-10 | Discharge: 2013-10-10 | Disposition: A | Discharge status: Home / Self Care | Payer: Medicare Other | Source: Home / Self Care | Attending: Emergency Medicine | Admitting: Emergency Medicine

## 2013-10-10 ENCOUNTER — Ambulatory Visit (HOSPITAL_COMMUNITY): Payer: Medicare Other

## 2013-10-10 DIAGNOSIS — Z87891 Personal history of nicotine dependence: Secondary | ICD-10-CM

## 2013-10-10 DIAGNOSIS — Z9889 Other specified postprocedural states: Secondary | ICD-10-CM | POA: Insufficient documentation

## 2013-10-10 DIAGNOSIS — G4489 Other headache syndrome: Secondary | ICD-10-CM

## 2013-10-10 DIAGNOSIS — Z79899 Other long term (current) drug therapy: Secondary | ICD-10-CM | POA: Insufficient documentation

## 2013-10-10 DIAGNOSIS — I10 Essential (primary) hypertension: Secondary | ICD-10-CM

## 2013-10-10 DIAGNOSIS — Z8739 Personal history of other diseases of the musculoskeletal system and connective tissue: Secondary | ICD-10-CM | POA: Insufficient documentation

## 2013-10-10 DIAGNOSIS — Z880 Allergy status to penicillin: Secondary | ICD-10-CM

## 2013-10-10 DIAGNOSIS — G8929 Other chronic pain: Secondary | ICD-10-CM | POA: Insufficient documentation

## 2013-10-10 DIAGNOSIS — Z8673 Personal history of transient ischemic attack (TIA), and cerebral infarction without residual deficits: Secondary | ICD-10-CM

## 2013-10-10 DIAGNOSIS — I4891 Unspecified atrial fibrillation: Secondary | ICD-10-CM | POA: Insufficient documentation

## 2013-10-10 DIAGNOSIS — Z954 Presence of other heart-valve replacement: Secondary | ICD-10-CM | POA: Insufficient documentation

## 2013-10-10 DIAGNOSIS — M549 Dorsalgia, unspecified: Secondary | ICD-10-CM

## 2013-10-10 DIAGNOSIS — R42 Dizziness and giddiness: Secondary | ICD-10-CM | POA: Diagnosis not present

## 2013-10-10 DIAGNOSIS — E785 Hyperlipidemia, unspecified: Secondary | ICD-10-CM | POA: Insufficient documentation

## 2013-10-10 DIAGNOSIS — E860 Dehydration: Secondary | ICD-10-CM | POA: Insufficient documentation

## 2013-10-10 DIAGNOSIS — K219 Gastro-esophageal reflux disease without esophagitis: Secondary | ICD-10-CM

## 2013-10-10 DIAGNOSIS — R32 Unspecified urinary incontinence: Secondary | ICD-10-CM | POA: Insufficient documentation

## 2013-10-10 DIAGNOSIS — I5033 Acute on chronic diastolic (congestive) heart failure: Secondary | ICD-10-CM | POA: Diagnosis not present

## 2013-10-10 DIAGNOSIS — R51 Headache: Secondary | ICD-10-CM | POA: Diagnosis not present

## 2013-10-10 LAB — CBC WITH DIFFERENTIAL/PLATELET
BASOS ABS: 0 10*3/uL (ref 0.0–0.1)
BASOS PCT: 0 % (ref 0–1)
EOS ABS: 0.2 10*3/uL (ref 0.0–0.7)
EOS PCT: 2 % (ref 0–5)
HEMATOCRIT: 37.3 % (ref 36.0–46.0)
HEMOGLOBIN: 12.2 g/dL (ref 12.0–15.0)
Lymphocytes Relative: 19 % (ref 12–46)
Lymphs Abs: 2 10*3/uL (ref 0.7–4.0)
MCH: 29.1 pg (ref 26.0–34.0)
MCHC: 32.7 g/dL (ref 30.0–36.0)
MCV: 89 fL (ref 78.0–100.0)
MONO ABS: 1.2 10*3/uL — AB (ref 0.1–1.0)
MONOS PCT: 11 % (ref 3–12)
Neutro Abs: 7.1 10*3/uL (ref 1.7–7.7)
Neutrophils Relative %: 68 % (ref 43–77)
Platelets: 234 10*3/uL (ref 150–400)
RBC: 4.19 MIL/uL (ref 3.87–5.11)
RDW: 13.3 % (ref 11.5–15.5)
WBC: 10.5 10*3/uL (ref 4.0–10.5)

## 2013-10-10 LAB — COMPREHENSIVE METABOLIC PANEL
ALBUMIN: 3.3 g/dL — AB (ref 3.5–5.2)
ALT: 20 U/L (ref 0–35)
ANION GAP: 13 (ref 5–15)
AST: 29 U/L (ref 0–37)
Alkaline Phosphatase: 90 U/L (ref 39–117)
BUN: 8 mg/dL (ref 6–23)
CALCIUM: 9.1 mg/dL (ref 8.4–10.5)
CO2: 25 mEq/L (ref 19–32)
CREATININE: 0.55 mg/dL (ref 0.50–1.10)
Chloride: 98 mEq/L (ref 96–112)
GFR calc Af Amer: >90 mL/min (ref >90)
GFR calc non Af Amer: 84 mL/min — ABNORMAL LOW (ref >90)
Glucose, Bld: 97 mg/dL (ref 70–99)
Potassium: 3.9 mEq/L (ref 3.7–5.3)
Sodium: 136 mEq/L — ABNORMAL LOW (ref 137–147)
TOTAL PROTEIN: 6.9 g/dL (ref 6.0–8.3)
Total Bilirubin: 0.5 mg/dL (ref 0.3–1.2)

## 2013-10-10 LAB — URINALYSIS, ROUTINE W REFLEX MICROSCOPIC
Bilirubin Urine: NEGATIVE
GLUCOSE, UA: NEGATIVE mg/dL
KETONES UR: NEGATIVE mg/dL
NITRITE: NEGATIVE
PH: 7 (ref 5.0–8.0)
Protein, ur: NEGATIVE mg/dL
Specific Gravity, Urine: 1.008 (ref 1.005–1.030)
Urobilinogen, UA: 0.2 mg/dL (ref 0.0–1.0)

## 2013-10-10 LAB — URINE MICROSCOPIC-ADD ON

## 2013-10-10 MED ORDER — ACETAMINOPHEN 325 MG PO TABS
650.0000 mg | ORAL_TABLET | Freq: Once | ORAL | Status: AC
  Administered 2013-10-10: 650 mg via ORAL
  Filled 2013-10-10: qty 2

## 2013-10-10 MED ORDER — PSEUDOEPHEDRINE HCL 60 MG PO TABS: 60.0000 mg | ORAL_TABLET | Freq: Four times a day (QID) | ORAL | Status: DC | PRN

## 2013-10-10 MED ORDER — SODIUM CHLORIDE 0.9 % IV BOLUS (SEPSIS)
500.0000 mL | Freq: Once | INTRAVENOUS | Status: AC
  Administered 2013-10-10: 500 mL via INTRAVENOUS

## 2013-10-10 MED ORDER — HYDROMORPHONE HCL PF 1 MG/ML IJ SOLN
0.5000 mg | Freq: Once | INTRAMUSCULAR | Status: AC
  Administered 2013-10-10: 0.5 mg via INTRAVENOUS
  Filled 2013-10-10: qty 1

## 2013-10-10 MED ORDER — FENTANYL CITRATE 0.05 MG/ML IJ SOLN
50.0000 ug | Freq: Once | INTRAMUSCULAR | Status: AC
  Administered 2013-10-10: 50 ug via INTRAVENOUS
  Filled 2013-10-10: qty 2

## 2013-10-10 MED ORDER — IBUPROFEN 200 MG PO TABS: 400.0000 mg | ORAL_TABLET | Freq: Once | ORAL | Status: DC

## 2013-10-10 MED ORDER — BUTALBITAL-APAP-CAFFEINE 50-325-40 MG PO TABS: 1.0000 | ORAL_TABLET | Freq: Four times a day (QID) | ORAL | Status: DC | PRN

## 2013-10-10 MED ORDER — METOCLOPRAMIDE HCL 5 MG/ML IJ SOLN
5.0000 mg | Freq: Once | INTRAMUSCULAR | Status: AC
  Administered 2013-10-10: 5 mg via INTRAVENOUS
  Filled 2013-10-10: qty 2

## 2013-10-10 NOTE — ED Notes (Signed)
Pt back from CT  md at bedside

## 2013-10-10 NOTE — ED Provider Notes (Signed)
CSN: [id number]     Arrival date & time 10/10/13  0750 History   First MD Initiated Contact with Patient 10/10/13 [phone]     Chief Complaint  Patient presents with  . Headache  . Hypertension     (Consider location/radiation/quality/duration/timing/severity/associated sxs/prior Treatment) The history is provided by the patient.    Pt reports she had a mild frontal headache that began gradually two days ago.  Described as throbbing and constant.  Associated nasal congestion.  BP 140/70 when headache began.  The next morning the pain was "splitting" and she went to her PCP thinking she had sinusitis but was told she did not, was instructed to stop taking afrin and alkaselzer sinus medication and to take additional lisinopril as her systolic BP was 540.  She had temporary relief last night and then the pain woke her up around 2 or 3am.  She checked her blood pressure throughout the night and it remained elevated.  Pain is now 10/10.  Pt has hx stroke with residual deficits of gait imbalance and peripheral visual field cut - pt denies any change in this or any additional neurologic deficit.  Denies fevers, neck stiffness, CP, SOB, abdominal pain, N/V/D, urinary symptoms, peripheral edema.  Has been eating and drinking.   Past Medical History  Diagnosis Date  . Dyslipidemia     takes Lipitor daily  . Arthritis   . Urine incontinence   . Diverticulosis   . IC (interstitial cystitis)   . S/P aortic valve replacement     2005  . H/O hiatal hernia   . History of TIA (transient ischemic attack)     2013-- RESIDUAL PERIPHERAL VISION RIGHT EYE--  RESOLVED  . History of bacterial endocarditis   . Foot drop     SINCE 1987  . History of gastritis   . Chronic back pain   . Hypertension   . Atrial fibrillation   . GERD (gastroesophageal reflux disease)    Past Surgical History  Procedure Laterality Date  . Abdominal hysterectomy  1967  . Aortic valve replacement  09-29-2003     [facility] pericardial tissue valve  . Cholecystectomy  1993  . Vein ligation  1969    RIGHT LOWER LEG  . Dilation and curettage of uterus    . Ercp N/A 08/10/2012    Procedure: ENDOSCOPIC RETROGRADE CHOLANGIOPANCREATOGRAPHY (ERCP);  Surgeon: Inda Castle, MD;  Location: [facility];  Service: Gastroenterology;  Laterality: N/A;  . Cataract extraction w/ intraocular lens  implant, bilateral    . Appendectomy  1933  . Cardiac catheterization  07-15-2003 DR HELEN PRESTON    MODERATE TO MODERATELY SEVERE CALCIFIC AORTIC STENOSIS/  NORMAL CORONARY ARTERIES  . Right shoulder arthroscopy w/ debridement rotator cuff and labral tear/ acrominoplasty/ distal clavicle excision/ ca ligament release  10-08-1999  . Cysto/ hydrodistention/ instillation clorpactin  MULTIPLE  last one 2009  . Mini-open right rotator cuff repair  07-12-2001  . Transvaginal tape procedure  07-30-2002  . Lumbar disc surgery  1985 &  2007  . Macroplastique urethral implantation  08-27-2009  . Transthoracic echocardiogram  08-10-2011    MILD LVH/  EF 55-60%/  NORMAL AVR TISSUE/ MILD MR/  MODERATE DILATED LA/  MILD DILATED RA  . Cardiovascular stress test  01-30-2012  DR NASHER    NORMAL NUCLEAR STUDY/  EF 73%/  NORMAL LVF  . Tonsillectomy and adenoidectomy    . Carpal tunnel release Bilateral   . Cysto with hydrodistension N/A  02/05/2013    Procedure: CYSTOSCOPY/HYDRODISTENSION;  Surgeon: Irine Seal, MD;  Location: [facility];  Service: Urology;  Laterality: N/A;   Family History  Problem Relation Age of Onset  . Heart disease Father   . Coronary artery disease Brother   . Bipolar disorder Brother     commited suiside at 27yo  . Alcohol abuse Sister     sister #1  . Alcohol abuse Sister     sister #2  . Colon cancer Maternal Aunt   . Stomach cancer Maternal Grandmother    History  Substance Use Topics  . Smoking status: Former Smoker    Types: Cigarettes    Quit date: 03/06/1990  . Smokeless tobacco:  Never Used  . Alcohol Use: No   OB History   Grav Para Term Preterm Abortions TAB SAB Ect Mult Living                 Review of Systems  All other systems reviewed and are negative.     Allergies  Amoxicillin; Cephalexin; Codeine; Morphine and related; Neurontin; Nitrofurantoin monohyd macro; Prednisone; and Sulfa antibiotics  Home Medications   Prior to Admission medications   Medication Sig Start Date End Date Taking? Authorizing Provider  atenolol (TENORMIN) 100 MG tablet Take 100 mg by mouth daily.    Yes Historical Provider, MD  atorvastatin (LIPITOR) 20 MG tablet Take 20 mg by mouth daily.    Yes Historical Provider, MD  cholecalciferol (VITAMIN D) 1000 UNITS tablet Take 1,000 Units by mouth daily.   Yes Historical Provider, MD  lisinopril (PRINIVIL,ZESTRIL) 20 MG tablet Take 40 mg by mouth every evening.    Yes Historical Provider, MD  Multiple Vitamins-Minerals (MULTIVITAMIN WITH MINERALS) tablet Take 1 tablet by mouth daily.   Yes Historical Provider, MD  Omega-3 Fatty Acids (FISH OIL PO) Take 1 g by mouth daily.    Yes Historical Provider, MD  omeprazole (PRILOSEC) 20 MG capsule Take 20 mg by mouth 2 (two) times daily before a meal.   Yes Historical Provider, MD  oxybutynin (DITROPAN) 5 MG tablet Take 5 mg by mouth 3 (three) times daily.   Yes Historical Provider, MD  pantoprazole (PROTONIX) 40 MG tablet Take 40 mg by mouth daily.   Yes Historical Provider, MD  Rivaroxaban (XARELTO) 15 MG TABS tablet Take 15 mg by mouth daily.   Yes Historical Provider, MD   BP 183/95  Pulse 115  Temp(Src) 98.7 F (37.1 C) (Oral)  Resp 16  SpO2 94% Physical Exam  Nursing note and vitals reviewed. Constitutional: She appears well-developed and well-nourished. No distress.  HENT:  Head: Normocephalic and atraumatic.  Nose: Right sinus exhibits maxillary sinus tenderness and frontal sinus tenderness. Left sinus exhibits maxillary sinus tenderness and frontal sinus tenderness.   Mouth/Throat: Uvula is midline. Mucous membranes are dry.  No significant tenderness over area of temporal artery   Neck: Neck supple.  Cardiovascular: Normal rate and regular rhythm.   Pulmonary/Chest: Effort normal and breath sounds normal. No respiratory distress. She has no wheezes. She has no rales.  Abdominal: Soft. She exhibits no distension. There is no tenderness. There is no rebound and no guarding.  Musculoskeletal: She exhibits no edema.  Neurological: She is alert.  CN II-XII intact, EOMs intact, no pronator drift, grip strengths equal bilaterally; strength 5/5 in all extremities, sensation intact in all extremities; finger to nose, heel to shin, rapid alternating movements normal; no truncal ataxia.     Skin: She is  not diaphoretic.  Psychiatric: She has a normal mood and affect. Her behavior is normal. Thought content normal.    ED Course  Procedures (including critical care time) Labs Review Labs Reviewed  CBC WITH DIFFERENTIAL - Abnormal; Notable for the following:    Monocytes Absolute 1.2 (*)    All other components within normal limits  COMPREHENSIVE METABOLIC PANEL - Abnormal; Notable for the following:    Sodium 136 (*)    Albumin 3.3 (*)    GFR calc non Af Amer 84 (*)    All other components within normal limits  URINALYSIS, ROUTINE W REFLEX MICROSCOPIC - Abnormal; Notable for the following:    Hgb urine dipstick SMALL (*)    Leukocytes, UA SMALL (*)    All other components within normal limits  URINE MICROSCOPIC-ADD ON    Imaging Review Ct Head Wo Contrast  10/10/2013   CLINICAL DATA:  Headache, hypertension  EXAM: CT HEAD WITHOUT CONTRAST  TECHNIQUE: Contiguous axial images were obtained from the base of the skull through the vertex without intravenous contrast.  COMPARISON:  MRI 07/23/2011  FINDINGS: Mild atrophy. Moderate chronic microvascular ischemic change throughout the white matter. Chronic left medial occipital infarct.  Negative for acute infarct,  hemorrhage, or mass. Atherosclerotic disease is present. No focal bony abnormality.  IMPRESSION: Chronic ischemic change.  No acute abnormality.   Electronically Signed   By: Franchot Gallo M.D.   On: 10/10/2013 09:36     EKG Interpretation   Date/Time:  Thursday October 10 2013 08:07:52 EDT Ventricular Rate:  93 PR Interval:    QRS Duration: 93 QT Interval:  388 QTC Calculation: 483 R Axis:   -82 Text Interpretation:  Atrial fibrillation Left axis deviation Baseline  wander in lead(s) V2 V3 V4 No significant change since last tracing  Confirmed by Ashok Cordia  MD, Lennette Bihari ([id number]) on 10/10/2013 8:12:03 AM      8:49 AM Dr Ashok Cordia made aware of the patient.   10:27 AM Pain now 7/10.    11:40 AM Patient pain free - discussed all results with patient and her son.  Discussed patient and plan with Dr Ashok Cordia who has also seen the patient.  Plan is for d/c home with PCP follow up.   MDM   Final diagnoses:  Other headache syndrome  Essential hypertension  Mild dehydration    Pt with gradual onset of headache that began two days ago, associated nasal congestion.  Exam remarkable only for clinical dehydration. No head trauma.  No neuro deficits.  CT head negative. Pt is on xarelto.  She did have a spike of pain during the night, but she has not been taking any medications to lessen her pain.  Her BP was only mildly elevated when the headache began and became elevated after her pain was more intense.  She got great relief in the ED.   When pain improved, BP improved. D/C home with sudafed and fioricet PRN, instructions to continue PO hydration, close PCP follow up.  Lives at home with son and his wife.  Discussed result, findings, treatment, and follow up  with patient.  Pt given return precautions.  Pt verbalizes understanding and agrees with plan.        Clayton Bibles, PA-C 10/10/13 1246

## 2013-10-10 NOTE — ED Notes (Signed)
Pt to CT

## 2013-10-10 NOTE — Discharge Instructions (Signed)
Read the information below.  Use the prescribed medication as directed.  Please discuss all new medications with your pharmacist.  You may return to the Emergency Department at any time for worsening condition or any new symptoms that concern you.   ° °You are having a headache. No specific cause was found today for your headache. It may have been a migraine or other cause of headache. Stress, anxiety, fatigue, and depression are common triggers for headaches. Your headache today does not appear to be life-threatening or require hospitalization, but often the exact cause of headaches is not determined in the emergency department. Therefore, follow-up with your doctor is very important to find out what may have caused your headache, and whether or not you need any further diagnostic testing or treatment. Sometimes headaches can appear benign (not harmful), but then more serious symptoms can develop which should prompt an immediate re-evaluation by your doctor or the emergency department. °SEEK MEDICAL ATTENTION IF: °You develop possible problems with medications prescribed.  °The medications don't resolve your headache, if it recurs , or if you have multiple episodes of vomiting or can't take fluids. °You have a change from the usual headache. °RETURN IMMEDIATELY IF you develop a sudden, severe headache or confusion, become poorly responsive or faint, develop a fever above 100.4F or problem breathing, have a change in speech, vision, swallowing, or understanding, or develop new weakness, numbness, tingling, incoordination, or have a seizure. °

## 2013-10-10 NOTE — ED Notes (Addendum)
see triage note. pt has hx of HTN, has not been controlled lately. went to doctor yesterday for headache. given extra dose of lisinopril. was told if headache if headache did not get better by today to return to doctor or come to ED. pt denies blurry vision or dizziness, but reports she was more "shaky this morning". family agrees with this. no neuro decifits noted.  arm stregnth equal. leg strength strong. pt has drop foot in left foot, so that leg strength is at baseline weaker. face symmetrical. denies sensativity to light or sound. reports headache has been continous  10/10 pain, reports extra dose of BP medicine helped a little yesterday, but then pt went to bed and blood pressure became elevated again

## 2013-10-10 NOTE — ED Notes (Signed)
Pt alert and oriented x4. Respirations even and unlabored, bilateral symmetrical rise and fall of chest. Skin warm and dry. In no acute distress. Denies needs.   

## 2013-10-10 NOTE — ED Provider Notes (Signed)
Medical screening examination/treatment/procedure(s) were conducted as a shared visit with non-physician practitioner(s) and myself.  I personally evaluated the patient during the encounter.   EKG Interpretation   Date/Time:  Thursday October 10 2013 08:07:52 EDT Ventricular Rate:  93 PR Interval:    QRS Duration: 93 QT Interval:  388 QTC Calculation: 483 R Axis:   -82 Text Interpretation:  Atrial fibrillation Left axis deviation Baseline  wander in lead(s) V2 V3 V4 No significant change since last tracing  Confirmed by Suhaib Guzzo  MD, Lennette Bihari ([id number]) on 10/10/2013 8:12:03 AM      Pt c/o gradual onset frontal headache. Constant. Dull. No abrupt worsening since onset. Has seen pcp w same. No neck pain or stiffness. No eye pain or change in vision. No fever or chills. No associated numbness/weakness.  On exam, no neck stiffness/rigiditiy, no focal temporal tenderness. On xarelto.  Will get ct.     Mirna Mires, MD 10/10/13 [phone]

## 2013-10-10 NOTE — ED Notes (Signed)
AVS explained in detail. No other questions. PIV site not bleeding at this time. Pressure held for 10 minutes. In NAD.

## 2013-10-10 NOTE — ED Notes (Signed)
Pt c/o headache for three days. Pt has hypertension.  Pt was told by her PCP yesterday that is headache didn't get any better by today then to come to ED for further evaluation.

## 2013-10-11 ENCOUNTER — Encounter (HOSPITAL_COMMUNITY): Payer: Self-pay | Admitting: Emergency Medicine

## 2013-10-11 ENCOUNTER — Inpatient Hospital Stay (HOSPITAL_COMMUNITY)
Admission: EM | Admit: 2013-10-11 | Discharge: 2013-10-16 | DRG: 292 | Disposition: A | Discharge status: Home Health | Payer: Medicare Other | Attending: Internal Medicine | Admitting: Internal Medicine

## 2013-10-11 DIAGNOSIS — I5033 Acute on chronic diastolic (congestive) heart failure: Principal | ICD-10-CM | POA: Diagnosis present

## 2013-10-11 DIAGNOSIS — R1013 Epigastric pain: Secondary | ICD-10-CM

## 2013-10-11 DIAGNOSIS — J9819 Other pulmonary collapse: Secondary | ICD-10-CM | POA: Diagnosis not present

## 2013-10-11 DIAGNOSIS — Z8673 Personal history of transient ischemic attack (TIA), and cerebral infarction without residual deficits: Secondary | ICD-10-CM

## 2013-10-11 DIAGNOSIS — Z8249 Family history of ischemic heart disease and other diseases of the circulatory system: Secondary | ICD-10-CM

## 2013-10-11 DIAGNOSIS — Z954 Presence of other heart-valve replacement: Secondary | ICD-10-CM | POA: Diagnosis not present

## 2013-10-11 DIAGNOSIS — Z952 Presence of prosthetic heart valve: Secondary | ICD-10-CM

## 2013-10-11 DIAGNOSIS — I10 Essential (primary) hypertension: Secondary | ICD-10-CM | POA: Diagnosis present

## 2013-10-11 DIAGNOSIS — E785 Hyperlipidemia, unspecified: Secondary | ICD-10-CM | POA: Diagnosis present

## 2013-10-11 DIAGNOSIS — I350 Nonrheumatic aortic (valve) stenosis: Secondary | ICD-10-CM

## 2013-10-11 DIAGNOSIS — M549 Dorsalgia, unspecified: Secondary | ICD-10-CM | POA: Diagnosis present

## 2013-10-11 DIAGNOSIS — J9811 Atelectasis: Secondary | ICD-10-CM

## 2013-10-11 DIAGNOSIS — Z87891 Personal history of nicotine dependence: Secondary | ICD-10-CM | POA: Diagnosis not present

## 2013-10-11 DIAGNOSIS — H53459 Other localized visual field defect, unspecified eye: Secondary | ICD-10-CM | POA: Diagnosis present

## 2013-10-11 DIAGNOSIS — R51 Headache: Secondary | ICD-10-CM | POA: Diagnosis present

## 2013-10-11 DIAGNOSIS — I959 Hypotension, unspecified: Secondary | ICD-10-CM | POA: Diagnosis not present

## 2013-10-11 DIAGNOSIS — I480 Paroxysmal atrial fibrillation: Secondary | ICD-10-CM

## 2013-10-11 DIAGNOSIS — M129 Arthropathy, unspecified: Secondary | ICD-10-CM | POA: Diagnosis present

## 2013-10-11 DIAGNOSIS — I509 Heart failure, unspecified: Secondary | ICD-10-CM | POA: Diagnosis not present

## 2013-10-11 DIAGNOSIS — K8050 Calculus of bile duct without cholangitis or cholecystitis without obstruction: Secondary | ICD-10-CM

## 2013-10-11 DIAGNOSIS — I482 Chronic atrial fibrillation, unspecified: Secondary | ICD-10-CM | POA: Diagnosis present

## 2013-10-11 DIAGNOSIS — M216X9 Other acquired deformities of unspecified foot: Secondary | ICD-10-CM | POA: Diagnosis present

## 2013-10-11 DIAGNOSIS — G8929 Other chronic pain: Secondary | ICD-10-CM | POA: Diagnosis present

## 2013-10-11 DIAGNOSIS — Z7901 Long term (current) use of anticoagulants: Secondary | ICD-10-CM

## 2013-10-11 DIAGNOSIS — I4891 Unspecified atrial fibrillation: Secondary | ICD-10-CM

## 2013-10-11 DIAGNOSIS — R42 Dizziness and giddiness: Secondary | ICD-10-CM

## 2013-10-11 DIAGNOSIS — K2950 Unspecified chronic gastritis without bleeding: Secondary | ICD-10-CM

## 2013-10-11 DIAGNOSIS — L03119 Cellulitis of unspecified part of limb: Secondary | ICD-10-CM

## 2013-10-11 DIAGNOSIS — R1033 Periumbilical pain: Secondary | ICD-10-CM

## 2013-10-11 DIAGNOSIS — R1310 Dysphagia, unspecified: Secondary | ICD-10-CM

## 2013-10-11 DIAGNOSIS — N3010 Interstitial cystitis (chronic) without hematuria: Secondary | ICD-10-CM | POA: Diagnosis present

## 2013-10-11 DIAGNOSIS — K219 Gastro-esophageal reflux disease without esophagitis: Secondary | ICD-10-CM | POA: Diagnosis present

## 2013-10-11 DIAGNOSIS — K831 Obstruction of bile duct: Secondary | ICD-10-CM

## 2013-10-11 DIAGNOSIS — I5032 Chronic diastolic (congestive) heart failure: Secondary | ICD-10-CM | POA: Diagnosis not present

## 2013-10-11 DIAGNOSIS — R110 Nausea: Secondary | ICD-10-CM

## 2013-10-11 DIAGNOSIS — G9389 Other specified disorders of brain: Secondary | ICD-10-CM | POA: Diagnosis not present

## 2013-10-11 DIAGNOSIS — R519 Headache, unspecified: Secondary | ICD-10-CM | POA: Diagnosis present

## 2013-10-11 DIAGNOSIS — J449 Chronic obstructive pulmonary disease, unspecified: Secondary | ICD-10-CM | POA: Diagnosis not present

## 2013-10-11 DIAGNOSIS — R0609 Other forms of dyspnea: Secondary | ICD-10-CM

## 2013-10-11 DIAGNOSIS — I059 Rheumatic mitral valve disease, unspecified: Secondary | ICD-10-CM | POA: Diagnosis not present

## 2013-10-11 MED ORDER — LISINOPRIL 20 MG PO TABS
20.0000 mg | ORAL_TABLET | Freq: Once | ORAL | Status: AC
  Administered 2013-10-11: 20 mg via ORAL
  Filled 2013-10-11: qty 1

## 2013-10-11 MED ORDER — SODIUM CHLORIDE 0.9 % IV BOLUS (SEPSIS)
500.0000 mL | Freq: Once | INTRAVENOUS | Status: AC
Start: 2013-10-11 — End: 2013-10-11
  Administered 2013-10-11: 500 mL via INTRAVENOUS

## 2013-10-11 MED ORDER — METOCLOPRAMIDE HCL 5 MG/ML IJ SOLN
5.0000 mg | Freq: Once | INTRAMUSCULAR | Status: AC
  Administered 2013-10-11: 5 mg via INTRAVENOUS
  Filled 2013-10-11: qty 2

## 2013-10-11 MED ORDER — FENTANYL CITRATE 0.05 MG/ML IJ SOLN
25.0000 ug | Freq: Once | INTRAMUSCULAR | Status: AC
  Administered 2013-10-11: 25 ug via INTRAVENOUS
  Filled 2013-10-11: qty 2

## 2013-10-11 MED ORDER — DIPHENHYDRAMINE HCL 50 MG/ML IJ SOLN
12.5000 mg | Freq: Once | INTRAMUSCULAR | Status: AC
  Administered 2013-10-11: 12.5 mg via INTRAVENOUS
  Filled 2013-10-11: qty 1

## 2013-10-11 NOTE — ED Notes (Signed)
Per pt, was here for same symptoms yesterday-uncontrolled BP, and headache

## 2013-10-11 NOTE — ED Provider Notes (Signed)
CSN: [id number]     Arrival date & time 10/11/13  1802 History   First MD Initiated Contact with Patient 10/11/13 1826     Chief Complaint  Patient presents with  . Hypertension     (Consider location/radiation/quality/duration/timing/severity/associated sxs/prior Treatment) HPI Comments: Pt has had h/a since Tuesday, except for when she left he ED last night. Her BP has also been much elevated above her baseline. She has had some dizziness w/ ambulation.   Patient is a 80 y.o. female presenting with headaches. The history is provided by the patient. No language interpreter was used.  Headache Pain location:  Frontal Quality:  Dull Radiates to:  Does not radiate Severity currently:  3/10 Severity at highest:  10/10 Onset quality:  Gradual Duration:  3 days Timing:  Intermittent Progression:  Waxing and waning Chronicity:  New Similar to prior headaches: no   Relieved by: felt better yesterday after she left the ED and took her home BP meds at home. Worsened by:  Nothing tried Ineffective treatments:  NSAIDs and prescription medications Associated symptoms: dizziness   Associated symptoms: no abdominal pain, no back pain, no congestion, no cough, no diarrhea, no fatigue, no fever, no nausea, no neck pain, no neck stiffness, no numbness, no photophobia, no sore throat and no vomiting   Associated symptoms comment:  Hypertension  Risk factors comment:  CVA   Past Medical History  Diagnosis Date  . Dyslipidemia     takes Lipitor daily  . Arthritis   . Urine incontinence   . Diverticulosis   . IC (interstitial cystitis)   . S/P aortic valve replacement     2005  . H/O hiatal hernia   . History of TIA (transient ischemic attack)     2013-- RESIDUAL PERIPHERAL VISION RIGHT EYE--  RESOLVED  . History of bacterial endocarditis   . Foot drop     SINCE 1987  . History of gastritis   . Chronic back pain   . Hypertension   . Atrial fibrillation   . GERD (gastroesophageal  reflux disease)    Past Surgical History  Procedure Laterality Date  . Abdominal hysterectomy  1967  . Aortic valve replacement  09-29-2003     [facility] pericardial tissue valve  . Cholecystectomy  1993  . Vein ligation  1969    RIGHT LOWER LEG  . Dilation and curettage of uterus    . Ercp N/A 08/10/2012    Procedure: ENDOSCOPIC RETROGRADE CHOLANGIOPANCREATOGRAPHY (ERCP);  Surgeon: Inda Castle, MD;  Location: [facility];  Service: Gastroenterology;  Laterality: N/A;  . Cataract extraction w/ intraocular lens  implant, bilateral    . Appendectomy  1933  . Cardiac catheterization  07-15-2003 DR HELEN PRESTON    MODERATE TO MODERATELY SEVERE CALCIFIC AORTIC STENOSIS/  NORMAL CORONARY ARTERIES  . Right shoulder arthroscopy w/ debridement rotator cuff and labral tear/ acrominoplasty/ distal clavicle excision/ ca ligament release  10-08-1999  . Cysto/ hydrodistention/ instillation clorpactin  MULTIPLE  last one 2009  . Mini-open right rotator cuff repair  07-12-2001  . Transvaginal tape procedure  07-30-2002  . Lumbar disc surgery  1985 &  2007  . Macroplastique urethral implantation  08-27-2009  . Transthoracic echocardiogram  08-10-2011    MILD LVH/  EF 55-60%/  NORMAL AVR TISSUE/ MILD MR/  MODERATE DILATED LA/  MILD DILATED RA  . Cardiovascular stress test  01-30-2012  DR NASHER    NORMAL NUCLEAR STUDY/  EF 73%/  NORMAL LVF  .  Tonsillectomy and adenoidectomy    . Carpal tunnel release Bilateral   . Cysto with hydrodistension N/A 02/05/2013    Procedure: CYSTOSCOPY/HYDRODISTENSION;  Surgeon: Irine Seal, MD;  Location: [facility];  Service: Urology;  Laterality: N/A;   Family History  Problem Relation Age of Onset  . Heart disease Father   . Coronary artery disease Brother   . Bipolar disorder Brother     commited suiside at 62yo  . Alcohol abuse Sister     sister #1  . Alcohol abuse Sister     sister #2  . Colon cancer Maternal Aunt   . Stomach cancer  Maternal Grandmother    History  Substance Use Topics  . Smoking status: Former Smoker    Types: Cigarettes    Quit date: 03/06/1990  . Smokeless tobacco: Never Used  . Alcohol Use: No   OB History   Grav Para Term Preterm Abortions TAB SAB Ect Mult Living                 Review of Systems  Constitutional: Negative for fever, chills, diaphoresis, activity change, appetite change and fatigue.  HENT: Negative for congestion, facial swelling, rhinorrhea and sore throat.   Eyes: Negative for photophobia and discharge.  Respiratory: Negative for cough, chest tightness and shortness of breath.   Cardiovascular: Negative for chest pain, palpitations and leg swelling.  Gastrointestinal: Negative for nausea, vomiting, abdominal pain and diarrhea.  Endocrine: Negative for polydipsia and polyuria.  Genitourinary: Negative for dysuria, frequency, difficulty urinating and pelvic pain.  Musculoskeletal: Negative for arthralgias, back pain, neck pain and neck stiffness.  Skin: Negative for color change and wound.  Allergic/Immunologic: Negative for immunocompromised state.  Neurological: Positive for dizziness and headaches. Negative for facial asymmetry, weakness and numbness.  Hematological: Does not bruise/bleed easily.  Psychiatric/Behavioral: Negative for confusion and agitation.      Allergies  Amoxicillin; Cephalexin; Codeine; Morphine and related; Neurontin; Nitrofurantoin monohyd macro; Prednisone; and Sulfa antibiotics  Home Medications   Prior to Admission medications   Medication Sig Start Date End Date Taking? Authorizing Provider  atenolol (TENORMIN) 100 MG tablet Take 100 mg by mouth daily.    Yes Historical Provider, MD  atorvastatin (LIPITOR) 20 MG tablet Take 20 mg by mouth daily.    Yes Historical Provider, MD  butalbital-acetaminophen-caffeine (FIORICET) 50-325-40 MG per tablet Take 1 tablet by mouth every 6 (six) hours as needed for headache. 10/10/13 10/10/14 Yes Clayton Bibles, PA-C  cholecalciferol (VITAMIN D) 1000 UNITS tablet Take 1,000 Units by mouth daily.   Yes Historical Provider, MD  lisinopril (PRINIVIL,ZESTRIL) 20 MG tablet Take 40 mg by mouth every evening.    Yes Historical Provider, MD  Multiple Vitamins-Minerals (MULTIVITAMIN WITH MINERALS) tablet Take 1 tablet by mouth daily.   Yes Historical Provider, MD  Omega-3 Fatty Acids (FISH OIL PO) Take 1 g by mouth daily.    Yes Historical Provider, MD  omeprazole (PRILOSEC) 20 MG capsule Take 20 mg by mouth 2 (two) times daily before a meal.   Yes Historical Provider, MD  oxybutynin (DITROPAN) 5 MG tablet Take 5 mg by mouth 3 (three) times daily.   Yes Historical Provider, MD  pantoprazole (PROTONIX) 40 MG tablet Take 40 mg by mouth daily.   Yes Historical Provider, MD  pseudoephedrine (SUDAFED) 60 MG tablet Take 1 tablet (60 mg total) by mouth every 6 (six) hours as needed for congestion. 10/10/13  Yes Clayton Bibles, PA-C  Rivaroxaban (XARELTO) 15 MG TABS  tablet Take 15 mg by mouth daily.   Yes Historical Provider, MD   BP 173/82  Pulse 84  Temp(Src) 98.5 F (36.9 C) (Oral)  Resp 18  SpO2 95% Physical Exam  Constitutional: She is oriented to person, place, and time. She appears well-developed and well-nourished. No distress.  HENT:  Head: Normocephalic and atraumatic.  Mouth/Throat: No oropharyngeal exudate.  Eyes: Pupils are equal, round, and reactive to light.  Neck: Normal range of motion. Neck supple.  Cardiovascular: Normal rate, regular rhythm and normal heart sounds.  Exam reveals no gallop and no friction rub.   No murmur heard. Pulmonary/Chest: Effort normal and breath sounds normal. No respiratory distress. She has no wheezes. She has no rales.  Abdominal: Soft. Bowel sounds are normal. She exhibits no distension and no mass. There is no tenderness. There is no rebound and no guarding.  Musculoskeletal: Normal range of motion. She exhibits no edema and no tenderness.  Neurological: She is  alert and oriented to person, place, and time. No cranial nerve deficit or sensory deficit. GCS eye subscore is 4. GCS verbal subscore is 5. GCS motor subscore is 6.  Chronic L foot drop, chronic R lateral visual field defect. Gait slow, reports dizziness.   Skin: Skin is warm and dry.  Psychiatric: She has a normal mood and affect.    ED Course  Procedures (including critical care time) Labs Review Labs Reviewed - No data to display  Imaging Review Ct Head Wo Contrast  10/10/2013   CLINICAL DATA:  Headache, hypertension  EXAM: CT HEAD WITHOUT CONTRAST  TECHNIQUE: Contiguous axial images were obtained from the base of the skull through the vertex without intravenous contrast.  COMPARISON:  MRI 07/23/2011  FINDINGS: Mild atrophy. Moderate chronic microvascular ischemic change throughout the white matter. Chronic left medial occipital infarct.  Negative for acute infarct, hemorrhage, or mass. Atherosclerotic disease is present. No focal bony abnormality.  IMPRESSION: Chronic ischemic change.  No acute abnormality.   Electronically Signed   By: Franchot Gallo M.D.   On: 10/10/2013 09:36     EKG Interpretation   Date/Time:  Friday October 11 2013 18:30:16 EDT Ventricular Rate:  87 PR Interval:    QRS Duration: 102 QT Interval:  405 QTC Calculation: 487 R Axis:   -52 Text Interpretation:  Atrial fibrillation Ventricular premature complex  Left axis deviation Anterior infarct, old No significant change since last  tracing Confirmed by DOCHERTY  MD, MEGAN [phone]) on 10/11/2013 6:34:01 PM      MDM   Final diagnoses:  Dizziness  Essential hypertension  Nonintractable headache, unspecified chronicity pattern, unspecified headache type    Pt is a 79 y.o. female with Pmhx as above who presents with recurrent, intermittent frontal h/a for 3 days with assoc HTN above her baseline. Pt seen yesterday for same, had resolution of h/a w/ IVF, reglan, fentanyl, but woke up again w/ h/a today. She denies  new numbness, weakness, though has had some dizziness w/ ambulation.  On PE, PT hypertensive, VS otherwise stable. Neuro exam pertinent for sensation of mild dizziness w/ ambulation.  Pt given IVF, reglan, benadryl, home lisinopril with mild improvement of h/a, but no change in dizziness w/ ambulation. BP also unchanged. I spoke w/ Dr. Armida Sans of neurohospitalist group who agreed w/ my plan to get MRI brain to r/o CVA. Unfortunately, pt will have to be transferred to [facility] for MRI tomorrow morning given the holiday MRI schedule.  Pt and family aware & agreeable w/  plan for admission & transfer.         Neta Ehlers, MD 10/11/13 [phone]

## 2013-10-12 ENCOUNTER — Inpatient Hospital Stay (HOSPITAL_COMMUNITY): Payer: Medicare Other

## 2013-10-12 DIAGNOSIS — I059 Rheumatic mitral valve disease, unspecified: Secondary | ICD-10-CM

## 2013-10-12 DIAGNOSIS — J9811 Atelectasis: Secondary | ICD-10-CM | POA: Diagnosis present

## 2013-10-12 DIAGNOSIS — I272 Pulmonary hypertension, unspecified: Secondary | ICD-10-CM | POA: Insufficient documentation

## 2013-10-12 DIAGNOSIS — R519 Headache, unspecified: Secondary | ICD-10-CM | POA: Insufficient documentation

## 2013-10-12 DIAGNOSIS — I5033 Acute on chronic diastolic (congestive) heart failure: Secondary | ICD-10-CM | POA: Diagnosis present

## 2013-10-12 DIAGNOSIS — R42 Dizziness and giddiness: Secondary | ICD-10-CM

## 2013-10-12 DIAGNOSIS — R51 Headache: Secondary | ICD-10-CM | POA: Diagnosis present

## 2013-10-12 DIAGNOSIS — I5032 Chronic diastolic (congestive) heart failure: Secondary | ICD-10-CM

## 2013-10-12 LAB — LIPID PANEL
CHOL/HDL RATIO: 2.6 ratio
CHOLESTEROL: 159 mg/dL (ref 0–200)
HDL: 62 mg/dL (ref >39)
LDL Cholesterol: 86 mg/dL (ref 0–99)
Triglycerides: 53 mg/dL (ref <150)
VLDL: 11 mg/dL (ref 0–40)

## 2013-10-12 LAB — PRO B NATRIURETIC PEPTIDE: PRO B NATRI PEPTIDE: 2955 pg/mL — AB (ref 0–450)

## 2013-10-12 MED ORDER — RIVAROXABAN 15 MG PO TABS
15.0000 mg | ORAL_TABLET | Freq: Every day | ORAL | Status: DC
  Administered 2013-10-12 – 2013-10-15 (×4): 15 mg via ORAL
  Filled 2013-10-12 (×4): qty 1

## 2013-10-12 MED ORDER — LISINOPRIL 20 MG PO TABS
40.0000 mg | ORAL_TABLET | Freq: Every evening | ORAL | Status: DC
  Administered 2013-10-12 – 2013-10-14 (×3): 40 mg via ORAL
  Filled 2013-10-12 (×3): qty 2

## 2013-10-12 MED ORDER — FUROSEMIDE 10 MG/ML IJ SOLN
20.0000 mg | Freq: Two times a day (BID) | INTRAMUSCULAR | Status: DC
  Administered 2013-10-12 – 2013-10-14 (×5): 20 mg via INTRAVENOUS
  Filled 2013-10-12 (×5): qty 4

## 2013-10-12 MED ORDER — OXYBUTYNIN CHLORIDE 5 MG PO TABS
5.0000 mg | ORAL_TABLET | Freq: Three times a day (TID) | ORAL | Status: DC
  Administered 2013-10-12 – 2013-10-16 (×13): 5 mg via ORAL
  Filled 2013-10-12 (×13): qty 1

## 2013-10-12 MED ORDER — ATORVASTATIN CALCIUM 10 MG PO TABS
20.0000 mg | ORAL_TABLET | Freq: Every day | ORAL | Status: DC
  Administered 2013-10-12 – 2013-10-16 (×5): 20 mg via ORAL
  Filled 2013-10-12 (×5): qty 2

## 2013-10-12 MED ORDER — ACETAMINOPHEN 650 MG RE SUPP: 650.0000 mg | RECTAL | Status: DC | PRN

## 2013-10-12 MED ORDER — HYDRALAZINE HCL 10 MG PO TABS
10.0000 mg | ORAL_TABLET | Freq: Four times a day (QID) | ORAL | Status: DC | PRN
  Administered 2013-10-12 (×2): 10 mg via ORAL
  Filled 2013-10-12 (×2): qty 1

## 2013-10-12 MED ORDER — OXYCODONE HCL 5 MG PO TABS
5.0000 mg | ORAL_TABLET | ORAL | Status: DC | PRN
  Administered 2013-10-12 – 2013-10-16 (×9): 5 mg via ORAL
  Filled 2013-10-12 (×9): qty 1

## 2013-10-12 MED ORDER — ASPIRIN 325 MG PO TABS
325.0000 mg | ORAL_TABLET | Freq: Every day | ORAL | Status: DC
  Filled 2013-10-12 (×2): qty 1

## 2013-10-12 MED ORDER — ASPIRIN 300 MG RE SUPP: 300.0000 mg | Freq: Every day | RECTAL | Status: DC

## 2013-10-12 MED ORDER — ACETAMINOPHEN 325 MG PO TABS
650.0000 mg | ORAL_TABLET | ORAL | Status: DC | PRN
  Administered 2013-10-12 – 2013-10-15 (×8): 650 mg via ORAL
  Filled 2013-10-12 (×8): qty 2

## 2013-10-12 MED ORDER — ATENOLOL 100 MG PO TABS
100.0000 mg | ORAL_TABLET | Freq: Every day | ORAL | Status: DC
  Administered 2013-10-12 – 2013-10-13 (×2): 100 mg via ORAL
  Filled 2013-10-12 (×2): qty 1

## 2013-10-12 MED ORDER — PANTOPRAZOLE SODIUM 40 MG PO TBEC
40.0000 mg | DELAYED_RELEASE_TABLET | Freq: Every day | ORAL | Status: DC
  Administered 2013-10-12 – 2013-10-16 (×5): 40 mg via ORAL
  Filled 2013-10-12 (×6): qty 1

## 2013-10-12 MED ORDER — SENNOSIDES-DOCUSATE SODIUM 8.6-50 MG PO TABS: 1.0000 | ORAL_TABLET | Freq: Every evening | ORAL | Status: DC | PRN

## 2013-10-12 NOTE — Progress Notes (Signed)
PHARMACIST - PHYSICIAN COMMUNICATION DR:    CONCERNING:  Pt is on Xarelto PTA for Afib (w/ h/o CVA) at 15mg  daily; pt's CrCl is just above 50 and pt qualifies for Xarelto 20mg  daily if benefits exceed risk.  Consider changing if appropriate and SCr remains stable.  Wynona Neat, PharmD, BCPS 10/12/2013 1:45 AM

## 2013-10-12 NOTE — Progress Notes (Signed)
[facility] here to transport patient. 

## 2013-10-12 NOTE — Progress Notes (Signed)
Unit CM UR Completed by [facility] ED CM  W. Riley Hallum RN  

## 2013-10-12 NOTE — Progress Notes (Signed)
Read, reviewed, edited and agree with student's findings and recommendations.  Christion Leonhard B. Vickie Melnik, PT, DPT #[phone]  

## 2013-10-12 NOTE — Progress Notes (Signed)
Echocardiogram 2D Echocardiogram has been performed.  Brittany [name] 10/12/2013, 11:15 AM

## 2013-10-12 NOTE — Evaluation (Signed)
SLP Cancellation Note  Patient Details Name: Brittany [name] MRN: [id number] DOB: October 10, 1927   Cancelled treatment:       Reason Eval/Treat Not Completed: SLP screened, no needs identified, will sign off  Pt reports baseline function.  Son Konrad Dolores present and agreeable pt without speech, cognitive changes.  Pt able to adequately articulate to this slp without s/s of dysarthria or aphasia. Reorder if indicated.   Luanna Salk, [facility] SLP [phone]

## 2013-10-12 NOTE — Progress Notes (Addendum)
[facility] PROGRESS NOTE  Brittany [name] KGY:[id number] DOB: [birth year] DOA: 10/11/2013 PCP: Brittany Neer, MD  Assessment/Plan: 1.  Principal Problem-Headache(784.0): Suspect that this is secondary to malignant hypertension given time from starting a few days ago. MRI negative for CVA despite ongoing headache. As her blood pressure comes down, this should improve.  Active Problems: 2. Hypertension: Malignant. Patient states her blood pressure is normally quite controlled and she's not sure why her pressures at this time. She states that she's been compliant right upper to the point she was admitted with her medications. Suspected this may be in the setting of acute volume overload and she should respond well to Lasix. In the meantime treating with when necessary hydralazine.  3. Atrial fibrillation: Stable on Xarelto. Rate controlled  4. S/P AVR (aortic valve replacement): Noted.  5. Dizziness  6. Chronic diastolic heart failure: Grade 2 diastolic dysfunction noted on echocardiogram. New finding in diagnosis for patient. Awaiting BNP. We'll give information.  Already on beta blocker and ACE inhibitor. Addendum: BNP back at 2900. Started IV Lasix  7. Atelectasis: Patient has been complaining of some chronic episodes of sharp stabbing pain underneath her left breast, most notably when she leans forward. In exam, unable to find a focal spot, but does not appear to be a hernia. Chest extremity and she does have atelectasis there. She is reportedly more sedentary. Have added incentive,.  Code Status: Full code  Family Communication: Daughter at the bedside.  Disposition Plan: If indeed volume overloaded, need to fully diurese.  Need to resolve headache. Home likely tomorrow.  Consultants:  Neurology  Procedures:  Echocardiogram done 7/4: Grade 2 diastolic dysfunction  Antibiotics:  None  HPI/Subjective: Patient doing okay. Still some mild headache. Also complains of some  chronic pain below left breast sharp in nature occurs more when she leans forward.  Objective: Filed Vitals:   10/12/13 1355  BP: 173/81  Pulse: 65  Temp: 98.7 F (37.1 C)  Resp: 18   No intake or output data in the 24 hours ending 10/12/13 1604 Filed Weights   10/12/13 0800  Weight: 68.357 kg (150 lb 11.2 oz)    Exam:   General:  Alert x3, no acute distress  Cardiovascular: Regular rate and rhythm, S1 S2, occasional ectopic beat  Respiratory: Clear to auscultation bilaterally, decreased at the bases  Abdomen: Soft, nontender, nondistended, positive bowel sounds. Patient's area of focal tenderness is right below her left breast light of the rib cage line  Musculoskeletal: No clubbing or cyanosis, trace edema  Data Reviewed: Basic Metabolic Panel:  Recent Labs Lab 10/10/13 0849  NA 136*  K 3.9  CL 98  CO2 25  GLUCOSE 97  BUN 8  CREATININE 0.55  CALCIUM 9.1   Liver Function Tests:  Recent Labs Lab 10/10/13 0849  AST 29  ALT 20  ALKPHOS 90  BILITOT 0.5  PROT 6.9  ALBUMIN 3.3*   No results found for this basename: LIPASE, AMYLASE,  in the last 168 hours No results found for this basename: AMMONIA,  in the last 168 hours CBC:  Recent Labs Lab 10/10/13 0849  WBC 10.5  NEUTROABS 7.1  HGB 12.2  HCT 37.3  MCV 89.0  PLT 234   Cardiac Enzymes: No results found for this basename: CKTOTAL, CKMB, CKMBINDEX, TROPONINI,  in the last 168 hours BNP (last 3 results) No results found for this basename: PROBNP,  in the last 8760 hours CBG: No results found for this basename: GLUCAP,  in  the last 168 hours  No results found for this or any previous visit (from the past 240 hour(s)).   Studies: Dg Chest 2 View  10/12/2013     IMPRESSION: 1. Small amount of patchy atelectasis or pneumonia at the left lateral lung base. 2. Cardiomegaly and changes of COPD and chronic bronchitis.   Electronically Signed   By: Enrique Sack M.D.   On: 10/12/2013 08:53    Mr [name]  Head/brain Wo Cm  10/12/2013    IMPRESSION: No acute infarction. Old left occipital infarction. Advanced chronic small vessel change of the cerebral hemispheric white matter.  No major vessel occlusion or correctable proximal stenosis. Few missing distal PCA branches on the left.   Electronically Signed   By: Nelson Chimes M.D.   On: 10/12/2013 09:18    Scheduled Meds: . aspirin  300 mg Rectal Daily   Or  . aspirin  325 mg Oral Daily  . atenolol  100 mg Oral Daily  . atorvastatin  20 mg Oral Daily  . lisinopril  40 mg Oral QPM  . oxybutynin  5 mg Oral TID  . pantoprazole  40 mg Oral Daily  . Rivaroxaban  15 mg Oral Daily   Continuous Infusions:   Principal Problem:   Headache(784.0) Active Problems:   Hypertension   Atrial fibrillation   S/P AVR (aortic valve replacement)   Dizziness   Headache   Chronic diastolic heart failure Atelectasis   Time spent: 25 minutes    [facility] Pager [phone]. If 7PM-7AM, please contact night-coverage at [email or web address], password [facility] 10/12/2013, 4:04 PM  LOS: 1 day

## 2013-10-12 NOTE — Consult Note (Signed)
Referring Physician: [facility]    Chief Complaint: HA, imbalance  HPI:                                                                                                                                         Brittany [name] is an 80 y.o. female with a past medical history significant for hyperlipidemia, HTN, atrial fibrillation on xarelto,  left occipital infarct 2 years ago with residual right peripheral vision loss, transferred to [facility] for further evaluation and management of the above mentioned symptoms.Her blood pressure was elevated and she came to the emergency room yesterday and was sent home after she received Dilaudid and Benadryl, and her blood pressure was controlled to come back today with the same symptoms  She said that she gets infrequent " sinus HA" but for the last 3 days has been having a constant, severe, dull/pounding frontal HA without associated nausea, vomiting, photophobia, phonophobia, vertigo, focal weakness or numbness, slurred speech, language impairment or new visual changes. Stated that she is " little bit off balance" but denies falls. No recent head/neck trauma, fever, or infection. The HA is not improved by tylenol and Fioricet. CT brain showed no acute abnormality. Date last known well: unclear Time last known well: unclear tPA Given: no, out of the window   Past Medical History  Diagnosis Date  . Dyslipidemia     takes Lipitor daily  . Arthritis   . Urine incontinence   . Diverticulosis   . IC (interstitial cystitis)   . S/P aortic valve replacement     2005  . H/O hiatal hernia   . History of TIA (transient ischemic attack)     2013-- RESIDUAL PERIPHERAL VISION RIGHT EYE--  RESOLVED  . History of bacterial endocarditis   . Foot drop     SINCE 1987  . History of gastritis   . Chronic back pain   . Hypertension   . Atrial fibrillation   . GERD (gastroesophageal reflux disease)     Past Surgical History  Procedure Laterality Date  . Abdominal  hysterectomy  1967  . Aortic valve replacement  09-29-2003     [facility] pericardial tissue valve  . Cholecystectomy  1993  . Vein ligation  1969    RIGHT LOWER LEG  . Dilation and curettage of uterus    . Ercp N/A 08/10/2012    Procedure: ENDOSCOPIC RETROGRADE CHOLANGIOPANCREATOGRAPHY (ERCP);  Surgeon: Inda Castle, MD;  Location: [place];  Service: Gastroenterology;  Laterality: N/A;  . Cataract extraction w/ intraocular lens  implant, bilateral    . Appendectomy  1933  . Cardiac catheterization  07-15-2003 DR HELEN PRESTON    MODERATE TO MODERATELY SEVERE CALCIFIC AORTIC STENOSIS/  NORMAL CORONARY ARTERIES  . Right shoulder arthroscopy w/ debridement rotator cuff and labral tear/ acrominoplasty/ distal clavicle excision/ ca ligament release  10-08-1999  . Cysto/ hydrodistention/ instillation  clorpactin  MULTIPLE  last one 2009  . Mini-open right rotator cuff repair  07-12-2001  . Transvaginal tape procedure  07-30-2002  . Lumbar disc surgery  1985 &  2007  . Macroplastique urethral implantation  08-27-2009  . Transthoracic echocardiogram  08-10-2011    MILD LVH/  EF 55-60%/  NORMAL AVR TISSUE/ MILD MR/  MODERATE DILATED LA/  MILD DILATED RA  . Cardiovascular stress test  01-30-2012  DR NASHER    NORMAL NUCLEAR STUDY/  EF 73%/  NORMAL LVF  . Tonsillectomy and adenoidectomy    . Carpal tunnel release Bilateral   . Cysto with hydrodistension N/A 02/05/2013    Procedure: CYSTOSCOPY/HYDRODISTENSION;  Surgeon: Irine Seal, MD;  Location: [facility];  Service: Urology;  Laterality: N/A;    Family History  Problem Relation Age of Onset  . Heart disease Father   . Coronary artery disease Brother   . Bipolar disorder Brother     commited suiside at 64yo  . Alcohol abuse Sister     sister #1  . Alcohol abuse Sister     sister #2  . Colon cancer Maternal Aunt   . Stomach cancer Maternal Grandmother    Social History:  reports that she quit smoking about 23  years ago. Her smoking use included Cigarettes. She smoked 0.00 packs per day. She has never used smokeless tobacco. She reports that she does not drink alcohol or use illicit drugs.  Allergies:  Allergies  Allergen Reactions  . Amoxicillin Diarrhea and Nausea And Vomiting  . Cephalexin Diarrhea  . Codeine Nausea Only  . Morphine And Related Other (See Comments)    HX ADDICTION / WITHDRAWAL  . Neurontin [Gabapentin] Swelling    Legs swell  . Nitrofurantoin Monohyd Macro Diarrhea and Nausea Only  . Prednisone Nausea Only  . Sulfa Antibiotics Rash    Medications:                                                                                                                           I have reviewed the patient's current medications.  ROS:                                                                                                                                       History obtained from the patient and chart review.  General ROS: negative for -  chills, fatigue, fever, night sweats, weight gain or weight loss Psychological ROS: negative for - behavioral disorder, hallucinations, memory difficulties, mood swings or suicidal ideation Ophthalmic ROS: negative for - blurry vision, double vision, or eye pain ENT ROS: negative for - epistaxis, nasal discharge, oral lesions, sore throat, tinnitus or vertigo Allergy and Immunology ROS: negative for - hives or itchy/watery eyes Hematological and Lymphatic ROS: negative for - bleeding problems, bruising or swollen lymph nodes Endocrine ROS: negative for - galactorrhea, hair pattern changes, polydipsia/polyuria or temperature intolerance Respiratory ROS: negative for - cough, hemoptysis, shortness of breath or wheezing Cardiovascular ROS: negative for - chest pain, dyspnea on exertion, edema or irregular heartbeat Gastrointestinal ROS: negative for - abdominal pain, diarrhea, hematemesis, nausea/vomiting or stool incontinence Genito-Urinary  ROS: negative for - dysuria, hematuria, incontinence or urinary frequency/urgency Musculoskeletal ROS: negative for - joint swelling or muscular weakness Neurological ROS: as noted in HPI Dermatological ROS: negative for rash and skin lesion changes  Physical exam: pleasant female in no apparent distress. Blood pressure 225/124, pulse 84, temperature 98.3 F (36.8 C), temperature source Oral, resp. rate 16, height 5' 1" (1.549 m), SpO2 97.00%. Head: normocephalic. Neck: supple, no bruits, no JVD. Cardiac: no murmurs. Lungs: clear. Abdomen: soft, no tender, no mass. Extremities: no edema. CV: pulses palpable throughout  Neurologic Examination:                                                                                                      Mental Status: Alert, oriented, thought content appropriate.  Speech fluent without evidence of aphasia.  Able to follow 3 step commands without difficulty. Cranial Nerves: II: Discs flat bilaterally; right homonymous hemianopsis , pupils equal, round, reactive to light and accommodation III,IV, VI: ptosis not present, extra-ocular motions intact bilaterally V,VII: smile symmetric, facial light touch sensation normal bilaterally VIII: hearing normal bilaterally IX,X: gag reflex present XI: bilateral shoulder shrug XII: midline tongue extension without atrophy or fasciculations Motor: Right : Upper extremity   5/5    Left:     Upper extremity   5/5  Lower extremity   5/5     Lower extremity   5/5 Tone and bulk:normal tone throughout; no atrophy noted Sensory: Pinprick and light touch intact throughout, bilaterally Deep Tendon Reflexes:  Right: Upper Extremity   Left: Upper extremity   biceps (C-5 to C-6) 2/4   biceps (C-5 to C-6) 2/4 tricep (C7) 2/4    triceps (C7) 2/4 Brachioradialis (C6) 2/4  Brachioradialis (C6) 2/4  Lower Extremity Lower Extremity  quadriceps (L-2 to L-4) 2/4   quadriceps (L-2 to L-4) 2/4 Achilles (S1) 2/4   Achilles  (S1) 2/4  Plantars: Right: downgoing   Left: downgoing Cerebellar: normal finger-to-nose,  normal heel-to-shin test Gait:  No tested No meningeal signs.    Results for orders placed during the hospital encounter of 10/10/13 (from the past 48 hour(s))  CBC WITH DIFFERENTIAL     Status: Abnormal   Collection Time    10/10/13  8:49 AM      Result Value Ref Range  WBC 10.5  4.0 - 10.5 K/uL   RBC 4.19  3.87 - 5.11 MIL/uL   Hemoglobin 12.2  12.0 - 15.0 g/dL   HCT 37.3  36.0 - 46.0 %   MCV 89.0  78.0 - 100.0 fL   MCH 29.1  26.0 - 34.0 pg   MCHC 32.7  30.0 - 36.0 g/dL   RDW 13.3  11.5 - 15.5 %   Platelets 234  150 - 400 K/uL   Neutrophils Relative % 68  43 - 77 %   Neutro Abs 7.1  1.7 - 7.7 K/uL   Lymphocytes Relative 19  12 - 46 %   Lymphs Abs 2.0  0.7 - 4.0 K/uL   Monocytes Relative 11  3 - 12 %   Monocytes Absolute 1.2 (*) 0.1 - 1.0 K/uL   Eosinophils Relative 2  0 - 5 %   Eosinophils Absolute 0.2  0.0 - 0.7 K/uL   Basophils Relative 0  0 - 1 %   Basophils Absolute 0.0  0.0 - 0.1 K/uL  COMPREHENSIVE METABOLIC PANEL     Status: Abnormal   Collection Time    10/10/13  8:49 AM      Result Value Ref Range   Sodium 136 (*) 137 - 147 mEq/L   Potassium 3.9  3.7 - 5.3 mEq/L   Chloride 98  96 - 112 mEq/L   CO2 25  19 - 32 mEq/L   Glucose, Bld 97  70 - 99 mg/dL   BUN 8  6 - 23 mg/dL   Creatinine, Ser 0.55  0.50 - 1.10 mg/dL   Calcium 9.1  8.4 - 10.5 mg/dL   Total Protein 6.9  6.0 - 8.3 g/dL   Albumin 3.3 (*) 3.5 - 5.2 g/dL   AST 29  0 - 37 U/L   ALT 20  0 - 35 U/L   Alkaline Phosphatase 90  39 - 117 U/L   Total Bilirubin 0.5  0.3 - 1.2 mg/dL   GFR calc non Af Amer 84 (*) >90 mL/min   GFR calc Af Amer >90  >90 mL/min   Comment: (NOTE)     The eGFR has been calculated using the CKD EPI equation.     This calculation has not been validated in all clinical situations.     eGFR's persistently <90 mL/min signify possible Chronic Kidney     Disease.   Anion gap 13  5 - 15   URINALYSIS, ROUTINE W REFLEX MICROSCOPIC     Status: Abnormal   Collection Time    10/10/13  9:07 AM      Result Value Ref Range   Color, Urine YELLOW  YELLOW   APPearance CLEAR  CLEAR   Specific Gravity, Urine 1.008  1.005 - 1.030   pH 7.0  5.0 - 8.0   Glucose, UA NEGATIVE  NEGATIVE mg/dL   Hgb urine dipstick SMALL (*) NEGATIVE   Bilirubin Urine NEGATIVE  NEGATIVE   Ketones, ur NEGATIVE  NEGATIVE mg/dL   Protein, ur NEGATIVE  NEGATIVE mg/dL   Urobilinogen, UA 0.2  0.0 - 1.0 mg/dL   Nitrite NEGATIVE  NEGATIVE   Leukocytes, UA SMALL (*) NEGATIVE  URINE MICROSCOPIC-ADD ON     Status: None   Collection Time    10/10/13  9:07 AM      Result Value Ref Range   Squamous Epithelial / LPF RARE  RARE   WBC, UA 0-2  <3 WBC/hpf   RBC / HPF 3-6  <  3 RBC/hpf   Ct Head Wo Contrast  10/10/2013   CLINICAL DATA:  Headache, hypertension  EXAM: CT HEAD WITHOUT CONTRAST  TECHNIQUE: Contiguous axial images were obtained from the base of the skull through the vertex without intravenous contrast.  COMPARISON:  MRI 07/23/2011  FINDINGS: Mild atrophy. Moderate chronic microvascular ischemic change throughout the white matter. Chronic left medial occipital infarct.  Negative for acute infarct, hemorrhage, or mass. Atherosclerotic disease is present. No focal bony abnormality.  IMPRESSION: Chronic ischemic change.  No acute abnormality.   Electronically Signed   By: Franchot Gallo M.D.   On: 10/10/2013 09:36   Assessment: 81 y.o. female with frontal HA associated with imbalance for the past 3 days. Neuro-exam significant for old right homonomous hemianopsia. CT brain negative. Has multiple risk factors for stroke. Agree with MRI/MRA brain to exclude a structural cause of HA. Continue pradaxa. Will follow up.  Dorian Pod, MD [facility] [phone]  10/12/2013, 2:39 AM

## 2013-10-12 NOTE — Progress Notes (Signed)
Pt swallow screen completed. Successful. Lung sounds unchanged. NIH scale completed

## 2013-10-12 NOTE — H&P (Addendum)
[facility]                                                                                    Patient Demographics  Brittany [name], is a 80 y.o. female  CSN: [id number]  MRN: [id number]  DOB - [birth year]  Admit Date - 10/11/2013  Outpatient Primary MD for the patient is Mayra Neer, MD   With History of -  Past Medical History  Diagnosis Date  . Dyslipidemia     takes Lipitor daily  . Arthritis   . Urine incontinence   . Diverticulosis   . IC (interstitial cystitis)   . S/P aortic valve replacement     2005  . H/O hiatal hernia   . History of TIA (transient ischemic attack)     2013-- RESIDUAL PERIPHERAL VISION RIGHT EYE--  RESOLVED  . History of bacterial endocarditis   . Foot drop     SINCE 1987  . History of gastritis   . Chronic back pain   . Hypertension   . Atrial fibrillation   . GERD (gastroesophageal reflux disease)       Past Surgical History  Procedure Laterality Date  . Abdominal hysterectomy  1967  . Aortic valve replacement  09-29-2003     [facility] pericardial tissue valve  . Cholecystectomy  1993  . Vein ligation  1969    RIGHT LOWER LEG  . Dilation and curettage of uterus    . Ercp N/A 08/10/2012    Procedure: ENDOSCOPIC RETROGRADE CHOLANGIOPANCREATOGRAPHY (ERCP);  Surgeon: Inda Castle, MD;  Location: [facility];  Service: Gastroenterology;  Laterality: N/A;  . Cataract extraction w/ intraocular lens  implant, bilateral    . Appendectomy  1933  . Cardiac catheterization  07-15-2003 DR HELEN PRESTON    MODERATE TO MODERATELY SEVERE CALCIFIC AORTIC STENOSIS/  NORMAL CORONARY ARTERIES  . Right shoulder arthroscopy w/ debridement rotator cuff and labral tear/ acrominoplasty/ distal clavicle excision/ ca ligament release  10-08-1999  . Cysto/ hydrodistention/ instillation clorpactin  MULTIPLE  last one 2009  . Mini-open right rotator cuff repair  07-12-2001  . Transvaginal tape procedure  07-30-2002  . Lumbar disc  surgery  1985 &  2007  . Macroplastique urethral implantation  08-27-2009  . Transthoracic echocardiogram  08-10-2011    MILD LVH/  EF 55-60%/  NORMAL AVR TISSUE/ MILD MR/  MODERATE DILATED LA/  MILD DILATED RA  . Cardiovascular stress test  01-30-2012  DR NASHER    NORMAL NUCLEAR STUDY/  EF 73%/  NORMAL LVF  . Tonsillectomy and adenoidectomy    . Carpal tunnel release Bilateral   . Cysto with hydrodistension N/A 02/05/2013    Procedure: CYSTOSCOPY/HYDRODISTENSION;  Surgeon: Irine Seal, MD;  Location: [facility];  Service: Urology;  Laterality: N/A;    in for   Chief Complaint  Patient presents with  . Hypertension     HPI  Brittany [name]  is a 80 y.o. female, with past medical history significant for hypertension and previous CVA diagnosed 2 years ago with right peripheral vision loss, presenting today with a few days history of headache and elevated blood pressure with dizziness. The  patient started having a headache and stuffy nose few days ago and she received Sudafed and Afrin for a possible sinusitis . Her blood pressure was elevated and she came to the emergency room yesterday and was sent home after she received Dilaudid and Benadryl, and her blood pressure was controlled to come back today with the same symptoms .CT of the head was negative . Her headache is frontal ,dull, nonradiating associated with nausea but no vomiting and no loss of consciousness.    Review of Systems    In addition to the HPI above,  No Fever-chills, No problems swallowing food or Liquids, No Chest pain, Cough or Shortness of Breath, No Abdominal pain, No Nausea or Vommitting, Bowel movements are regular, No Blood in stool or Urine, No dysuria, No new skin rashes or bruises, No new joints pains-aches,  No new weakness, tingling, numbness in any extremity, No recent weight gain or loss, No polyuria, polydypsia or polyphagia, No significant Mental Stressors.  A full 10 point  Review of Systems was done, except as stated above, all other Review of Systems were negative.   Social History History  Substance Use Topics  . Smoking status: Former Smoker    Types: Cigarettes    Quit date: 03/06/1990  . Smokeless tobacco: Never Used  . Alcohol Use: No     Family History Family History  Problem Relation Age of Onset  . Heart disease Father   . Coronary artery disease Brother   . Bipolar disorder Brother     commited suiside at 73yo  . Alcohol abuse Sister     sister #1  . Alcohol abuse Sister     sister #2  . Colon cancer Maternal Aunt   . Stomach cancer Maternal Grandmother      Prior to Admission medications   Medication Sig Start Date End Date Taking? Authorizing Provider  atenolol (TENORMIN) 100 MG tablet Take 100 mg by mouth daily.    Yes Historical Provider, MD  atorvastatin (LIPITOR) 20 MG tablet Take 20 mg by mouth daily.    Yes Historical Provider, MD  butalbital-acetaminophen-caffeine (FIORICET) 50-325-40 MG per tablet Take 1 tablet by mouth every 6 (six) hours as needed for headache. 10/10/13 10/10/14 Yes Clayton Bibles, PA-C  cholecalciferol (VITAMIN D) 1000 UNITS tablet Take 1,000 Units by mouth daily.   Yes Historical Provider, MD  lisinopril (PRINIVIL,ZESTRIL) 20 MG tablet Take 40 mg by mouth every evening.    Yes Historical Provider, MD  Multiple Vitamins-Minerals (MULTIVITAMIN WITH MINERALS) tablet Take 1 tablet by mouth daily.   Yes Historical Provider, MD  Omega-3 Fatty Acids (FISH OIL PO) Take 1 g by mouth daily.    Yes Historical Provider, MD  omeprazole (PRILOSEC) 20 MG capsule Take 20 mg by mouth 2 (two) times daily before a meal.   Yes Historical Provider, MD  oxybutynin (DITROPAN) 5 MG tablet Take 5 mg by mouth 3 (three) times daily.   Yes Historical Provider, MD  pantoprazole (PROTONIX) 40 MG tablet Take 40 mg by mouth daily.   Yes Historical Provider, MD  pseudoephedrine (SUDAFED) 60 MG tablet Take 1 tablet (60 mg total) by mouth every  6 (six) hours as needed for congestion. 10/10/13  Yes Clayton Bibles, PA-C  Rivaroxaban (XARELTO) 15 MG TABS tablet Take 15 mg by mouth daily.   Yes Historical Provider, MD    Allergies  Allergen Reactions  . Amoxicillin Diarrhea and Nausea And Vomiting  . Cephalexin Diarrhea  . Codeine Nausea Only  .  Morphine And Related Other (See Comments)    HX ADDICTION / WITHDRAWAL  . Neurontin [Gabapentin] Swelling    Legs swell  . Nitrofurantoin Monohyd Macro Diarrhea and Nausea Only  . Prednisone Nausea Only  . Sulfa Antibiotics Rash    Physical Exam  Vitals  Blood pressure 173/82, pulse 84, temperature 98.5 F (36.9 C), temperature source Oral, resp. rate 18, SpO2 95.00%.   1. General  elderly white female, very pleasant in no acute distress at the moment  2. Normal affect and insight, Not Suicidal or Homicidal, Awake Alert, Oriented X 3.  3. No F.N deficits, ALL C.Nerves Intact, Strength 5/5 all 4 extremities, left foot drop and is   4. Ears and Eyes appear Normal, Conjunctivae clear, PERRLA. Moist Oral Mucosa.  5. Supple Neck, No JVD, No cervical lymphadenopathy appriciated, No Carotid Bruits.  6. Symmetrical Chest wall movement, Good air movement bilaterally, CTAB.  7. RRR, No Gallops, Rubs or Murmurs, No Parasternal Heave.  8. Positive Bowel Sounds, Abdomen Soft, Non tender, No organomegaly appriciated,No rebound -guarding or rigidity.  9.  No Cyanosis, Normal Skin Turgor, No Skin Rash or Bruise.  10. Good muscle tone,  joints appear normal , no effusions, Normal ROM.  11. No Palpable Lymph Nodes in Neck or Axillae    Data Review  CBC  Recent Labs Lab 10/10/13 0849  WBC 10.5  HGB 12.2  HCT 37.3  PLT 234  MCV 89.0  MCH 29.1  MCHC 32.7  RDW 13.3  LYMPHSABS 2.0  MONOABS 1.2*  EOSABS 0.2  BASOSABS 0.0   ------------------------------------------------------------------------------------------------------------------  Chemistries   Recent Labs Lab  10/10/13 0849  NA 136*  K 3.9  CL 98  CO2 25  GLUCOSE 97  BUN 8  CREATININE 0.55  CALCIUM 9.1  AST 29  ALT 20  ALKPHOS 90  BILITOT 0.5   ------------------------------------------------------------------------------------------------------------------ CrCl is unknown because both a height and weight (above a minimum accepted value) are required for this calculation. ------------------------------------------------------------------------------------------------------------------ No results found for this basename: TSH, T4TOTAL, FREET3, T3FREE, THYROIDAB,  in the last 72 hours   Coagulation profile No results found for this basename: INR, PROTIME,  in the last 168 hours ------------------------------------------------------------------------------------------------------------------- No results found for this basename: DDIMER,  in the last 72 hours -------------------------------------------------------------------------------------------------------------------  Cardiac Enzymes No results found for this basename: CK, CKMB, TROPONINI, MYOGLOBIN,  in the last 168 hours ------------------------------------------------------------------------------------------------------------------ No components found with this basename: POCBNP,    ---------------------------------------------------------------------------------------------------------------  Urinalysis    Component Value Date/Time   COLORURINE YELLOW 10/10/2013 0907   APPEARANCEUR CLEAR 10/10/2013 0907   LABSPEC 1.008 10/10/2013 0907   PHURINE 7.0 10/10/2013 0907   GLUCOSEU NEGATIVE 10/10/2013 0907   HGBUR SMALL* 10/10/2013 0907   BILIRUBINUR NEGATIVE 10/10/2013 [place] 10/10/2013 0907   PROTEINUR NEGATIVE 10/10/2013 0907   UROBILINOGEN 0.2 10/10/2013 0907   NITRITE NEGATIVE 10/10/2013 0907   LEUKOCYTESUR SMALL* 10/10/2013 0907     ----------------------------------------------------------------------------------------------------------------     Imaging results:   Ct Head Wo Contrast  10/10/2013   CLINICAL DATA:  Headache, hypertension  EXAM: CT HEAD WITHOUT CONTRAST  TECHNIQUE: Contiguous axial images were obtained from the base of the skull through the vertex without intravenous contrast.  COMPARISON:  MRI 07/23/2011  FINDINGS: Mild atrophy. Moderate chronic microvascular ischemic change throughout the white matter. Chronic left medial occipital infarct.  Negative for acute infarct, hemorrhage, or mass. Atherosclerotic disease is present. No focal bony abnormality.  IMPRESSION: Chronic ischemic change.  No acute  abnormality.   Electronically Signed   By: Franchot Gallo M.D.   On: 10/10/2013 09:36    My personal review of EKG: atrial fibrillation with a rate of 87 beats per minute, PVCs and left axis deviation. Old anterior MI noted     Assessment & Plan  1. elderly female come in with headache, dizziness and uncontrolled hypertension      Rule out CVA      Aspirin/statin      MRI/MRA  2.Hypertension, uncontrolled     Continue atenolol and Zestril    Will keep elevated, until MRI rules out a CVA 3.atrial fibrillation ;  history of AVR     Check echocardiogram     Continue Xarelto    DVT Prophylaxis Xarelto  AM Labs Ordered, also please review Full Orders  Family Communication: Admission, patients condition and plan of care including tests being ordered have been discussed with the patient and sonwho indicate understanding and agree with the plan and Code Status.  Code Status full  Disposition Plan: home  Time spent in minutes : 34 minutes  Condition GUARDED   @SIGNATURE @

## 2013-10-12 NOTE — Progress Notes (Signed)
Patient just left the floor for a chest X-Ray. She is alert, oriented and in no acute distress.

## 2013-10-12 NOTE — Progress Notes (Signed)
Physical Therapy Evaluation Patient Details Name: Brittany [name] MRN: [id number] DOB: [birth year] Today's Date: 10/12/2013   History of Present Illness  80 y.o. admitted 10/11/13 with headache.CT negative MRI pending. PMHx of hyperlipidemia, HTN, atrial fibrilation, left occipital infarct 2 years ago with residual right peripheral vision loss, left foot drop, chronic back pain, lumber disc surgery, and right rotator cuff repair.  Clinical Impression  Patient normally amb with brace for left drop foot and cane, however patient does not have brace here. Patient mobilizes well with use of RW and has support of son and his wife at home for supervision. PT to sign off.    Follow Up Recommendations No PT follow up          Precautions / Restrictions Restrictions Weight Bearing Restrictions: No      Mobility    Transfers Overall transfer level: Needs assistance Equipment used: Rolling walker (2 wheeled) Transfers: Sit to/from Stand Sit to Stand: Supervision         General transfer comment: Supervision for patient safety  Ambulation/Gait Ambulation/Gait assistance: Supervision;Min guard Ambulation Distance (Feet): 150 Feet Assistive device: Rolling walker (2 wheeled) Gait Pattern/deviations: Steppage (Left drop foot)     General Gait Details: Supervision and min guard for patient safety       Balance Overall balance assessment: Needs assistance Sitting-balance support: Feet unsupported;Feet supported;No upper extremity supported;Bilateral upper extremity supported Sitting balance-Leahy Scale: Good Sitting balance - Comments: Patient able to maintain balance through LE MMT sitting at the edge of the chair   Standing balance support: Bilateral upper extremity supported Standing balance-Leahy Scale: Poor Standing balance comment: Patient requires bilateral UE support of walker to maintain balance                             Pertinent Vitals/Pain See vitals  flow sheet.     Home Living Family/patient expects to be discharged to:: Private residence Living Arrangements: Children (son and wife) Available Help at Discharge: Family Type of Home: House Home Access: Ramped entrance     [place]: One level (Stays on first floor) Home Equipment: Kasandra Knudsen - single point Additional Comments: Only uses cane out of house    Prior Function Level of Independence: Independent         Comments: Uses cane only when outside     Hand Dominance   Dominant Hand: Right    Extremity/Trunk Assessment   Upper Extremity Assessment: Defer to OT evaluation           Lower Extremity Assessment: Overall WFL for tasks assessed (Left drop foot)         Communication   Communication: No difficulties  Cognition Arousal/Alertness: Awake/alert Behavior During Therapy: WFL for tasks assessed/performed Overall Cognitive Status: Within Functional Limits for tasks assessed                      General Comments General comments (skin integrity, edema, etc.): Patient normally amb with cane and brace for left drop foot. Amb with supervision/min guard for safety since we do not have her brace here.     PT Assessment Patent does not need any further PT services                  End of Session Equipment Utilized During Treatment: Gait belt Activity Tolerance: Patient tolerated treatment well Patient left: in chair;with chair alarm set;with family/visitor present Nurse Communication: Patient requests pain  meds (For her back)         Time: 1355-1420 PT Time Calculation (min): 25 min   Charges:   PT Evaluation $Initial PT Evaluation Tier I: 1 Procedure PT Treatments $Gait Training: 8-22 mins   PT G CodesEber Jones, [place] [phone]

## 2013-10-12 NOTE — Progress Notes (Signed)
OT Cancellation Note  Patient Details Name: WANETA FITTING MRN: [id number] DOB: 29-May-1927   Cancelled Treatment:    Reason Eval/Treat Not Completed: OT screened, no needs identified, will sign off. Pt observed ambulating with PT in hallway with no apparent balance deficits. Pt reports no change in vision, no weakness, no cognitive changes. Acute OT to sign off at this time.   Juluis Rainier [phone] 10/12/2013, 2:36 PM

## 2013-10-12 NOTE — Evaluation (Signed)
SLP Cancellation Note  Patient Details Name: Brittany [name] MRN: [id number] DOB: 1927-04-14   Cancelled treatment:       Reason Eval/Treat Not Completed: Patient at procedure or test/unavailable   Claudie Fisherman, [facility] SLP [phone]

## 2013-10-13 DIAGNOSIS — I5033 Acute on chronic diastolic (congestive) heart failure: Principal | ICD-10-CM

## 2013-10-13 DIAGNOSIS — I509 Heart failure, unspecified: Secondary | ICD-10-CM

## 2013-10-13 LAB — BASIC METABOLIC PANEL
ANION GAP: 14 (ref 5–15)
BUN: 11 mg/dL (ref 6–23)
CALCIUM: 9.2 mg/dL (ref 8.4–10.5)
CHLORIDE: 99 meq/L (ref 96–112)
CO2: 28 meq/L (ref 19–32)
Creatinine, Ser: 0.68 mg/dL (ref 0.50–1.10)
GFR calc Af Amer: >90 mL/min (ref >90)
GFR calc non Af Amer: 78 mL/min — ABNORMAL LOW (ref >90)
GLUCOSE: 93 mg/dL (ref 70–99)
POTASSIUM: 3.4 meq/L — AB (ref 3.7–5.3)
SODIUM: 141 meq/L (ref 137–147)

## 2013-10-13 LAB — PRO B NATRIURETIC PEPTIDE: Pro B Natriuretic peptide (BNP): 1863 pg/mL — ABNORMAL HIGH (ref 0–450)

## 2013-10-13 LAB — SEDIMENTATION RATE: SED RATE: 25 mm/h — AB (ref 0–22)

## 2013-10-13 MED ORDER — ONDANSETRON HCL 4 MG/2ML IJ SOLN
4.0000 mg | Freq: Four times a day (QID) | INTRAMUSCULAR | Status: DC | PRN
  Administered 2013-10-14 – 2013-10-15 (×3): 4 mg via INTRAVENOUS
  Filled 2013-10-13 (×3): qty 2

## 2013-10-13 MED ORDER — ASPIRIN EC 81 MG PO TBEC: 81.0000 mg | DELAYED_RELEASE_TABLET | Freq: Every day | ORAL | Status: DC

## 2013-10-13 MED ORDER — POTASSIUM CHLORIDE CRYS ER 20 MEQ PO TBCR
40.0000 meq | EXTENDED_RELEASE_TABLET | Freq: Every day | ORAL | Status: DC
  Administered 2013-10-13 – 2013-10-16 (×4): 40 meq via ORAL
  Filled 2013-10-13 (×4): qty 2

## 2013-10-13 NOTE — Progress Notes (Signed)
Subjective: Patient reports headache improved today and only minimal.  Feels she is much improved.    Objective: Current vital signs: BP 102/66  Pulse 82  Temp(Src) 98.2 F (36.8 C) (Oral)  Resp 18  Ht 5\' 1"  (1.549 m)  Wt 68.6 kg (151 lb 3.8 oz)  BMI 28.59 kg/m2  SpO2 98% Vital signs in last 24 hours: Temp:  [97.6 F (36.4 C)-98.6 F (37 C)] 98.2 F (36.8 C) (07/05 1338) Pulse Rate:  [75-91] 82 (07/05 1338) Resp:  [16-18] 18 (07/05 1338) BP: (102-162)/(66-89) 102/66 mmHg (07/05 1338) SpO2:  [94 %-98 %] 98 % (07/05 1338) Weight:  [68.6 kg (151 lb 3.8 oz)] 68.6 kg (151 lb 3.8 oz) (07/05 0700)  Intake/Output from previous day: 07/04 0701 - 07/05 0700 In: 480 [P.O.:480] Out: 1450 [Urine:1450] Intake/Output this shift: Total I/O In: 600 [P.O.:600] Out: 250 [Urine:250] Nutritional status: Cardiac  Neurologic Exam: Mental Status:  Alert, oriented, thought content appropriate. Speech fluent without evidence of aphasia. Able to follow 3 step commands without difficulty.  Cranial Nerves:  II: Discs flat bilaterally; right homonymous hemianopsis , pupils equal, round, reactive to light and accommodation  III,IV, VI: ptosis not present, extra-ocular motions intact bilaterally  V,VII: smile symmetric, facial light touch sensation normal bilaterally  VIII: hearing normal bilaterally  IX,X: gag reflex present  XI: bilateral shoulder shrug  XII: midline tongue extension without atrophy or fasciculations  Motor:  5/5 throughout Sensory: Pinprick and light touch intact throughout, bilaterally  Deep Tendon Reflexes:  2+ throughout Plantars:  Right: downgoing   Left: downgoing  Cerebellar:  normal finger-to-nose, normal heel-to-shin test    Lab Results: Basic Metabolic Panel:  Recent Labs Lab 10/10/13 0849 10/13/13 0453  NA 136* 141  K 3.9 3.4*  CL 98 99  CO2 25 28  GLUCOSE 97 93  BUN 8 11  CREATININE 0.55 0.68  CALCIUM 9.1 9.2    Liver Function Tests:  Recent  Labs Lab 10/10/13 0849  AST 29  ALT 20  ALKPHOS 90  BILITOT 0.5  PROT 6.9  ALBUMIN 3.3*   No results found for this basename: LIPASE, AMYLASE,  in the last 168 hours No results found for this basename: AMMONIA,  in the last 168 hours  CBC:  Recent Labs Lab 10/10/13 0849  WBC 10.5  NEUTROABS 7.1  HGB 12.2  HCT 37.3  MCV 89.0  PLT 234    Cardiac Enzymes: No results found for this basename: CKTOTAL, CKMB, CKMBINDEX, TROPONINI,  in the last 168 hours  Lipid Panel:  Recent Labs Lab 10/12/13 0525  CHOL 159  TRIG 53  HDL 62  CHOLHDL 2.6  VLDL 11  LDLCALC 86    CBG: No results found for this basename: GLUCAP,  in the last 168 hours  Microbiology: No results found for this or any previous visit.  Coagulation Studies: No results found for this basename: LABPROT, INR,  in the last 72 hours  Imaging: Dg Chest 2 View  10/12/2013   CLINICAL DATA:  Stroke.  EXAM: CHEST  2 VIEW  COMPARISON:  08/08/2012.  Abdomen CT dated 02/03/2012.  FINDINGS: Stable enlarged cardiac silhouette, median sternotomy wires and prosthetic heart valve. Curvilinear mitral valve annulus calcification on the left. Mildly prominent pulmonary vasculature and interstitial markings. The lungs are mildly hyperexpanded and mild diffuse peribronchial thickening is noted. Interval small amount of patchy density at the left lateral lung base. Diffuse osteopenia.  IMPRESSION: 1. Small amount of patchy atelectasis or pneumonia at the  left lateral lung base. 2. Cardiomegaly and changes of COPD and chronic bronchitis.   Electronically Signed   By: Enrique Sack M.D.   On: 10/12/2013 08:53   Mr Brain Wo Contrast  10/12/2013   CLINICAL DATA:  Stroke.  EXAM: MRI HEAD WITHOUT CONTRAST  MRA HEAD WITHOUT CONTRAST  TECHNIQUE: Multiplanar, multiecho pulse sequences of the brain and surrounding structures were obtained without intravenous contrast. Angiographic images of the head were obtained using MRA technique without  contrast.  COMPARISON:  Head CT 10/10/2013.  MRI 07/23/2011.  FINDINGS: MRI HEAD FINDINGS  Diffusion imaging does not show any acute or subacute infarction. There are mild chronic small-vessel changes affecting the pons. No focal cerebellar insult. The cerebral hemispheres show an old cortical infarction in the left occipital lobe which has progressed to atrophy and encephalomalacia. There are confluent chronic small vessel ischemic changes throughout the cerebral hemispheric white matter. No mass lesion, acute hemorrhage, hydrocephalus or extra-axial collection. Minimal hemosiderin is residual in the region of the left occipital infarction and in a few of the areas of chronic white matter infarction. No pituitary mass. No inflammatory sinus disease. No skull or skullbase lesion.  MRA HEAD FINDINGS  Both internal carotid arteries are widely patent into the brain. The anterior and middle cerebral vessels are normal without proximal stenosis, aneurysm or vascular malformation.  Both vertebral arteries are widely patent to the basilar. No basilar stenosis. Posterior circulation branch vessels are patent, but there are few root distal branch is seen in the left PCA than the right.  IMPRESSION: No acute infarction. Old left occipital infarction. Advanced chronic small vessel change of the cerebral hemispheric white matter.  No major vessel occlusion or correctable proximal stenosis. Few missing distal PCA branches on the left.   Electronically Signed   By: Nelson Chimes M.D.   On: 10/12/2013 09:18   Mr [name] Head/brain Wo Cm  10/12/2013   CLINICAL DATA:  Stroke.  EXAM: MRI HEAD WITHOUT CONTRAST  MRA HEAD WITHOUT CONTRAST  TECHNIQUE: Multiplanar, multiecho pulse sequences of the brain and surrounding structures were obtained without intravenous contrast. Angiographic images of the head were obtained using MRA technique without contrast.  COMPARISON:  Head CT 10/10/2013.  MRI 07/23/2011.  FINDINGS: MRI HEAD FINDINGS   Diffusion imaging does not show any acute or subacute infarction. There are mild chronic small-vessel changes affecting the pons. No focal cerebellar insult. The cerebral hemispheres show an old cortical infarction in the left occipital lobe which has progressed to atrophy and encephalomalacia. There are confluent chronic small vessel ischemic changes throughout the cerebral hemispheric white matter. No mass lesion, acute hemorrhage, hydrocephalus or extra-axial collection. Minimal hemosiderin is residual in the region of the left occipital infarction and in a few of the areas of chronic white matter infarction. No pituitary mass. No inflammatory sinus disease. No skull or skullbase lesion.  MRA HEAD FINDINGS  Both internal carotid arteries are widely patent into the brain. The anterior and middle cerebral vessels are normal without proximal stenosis, aneurysm or vascular malformation.  Both vertebral arteries are widely patent to the basilar. No basilar stenosis. Posterior circulation branch vessels are patent, but there are few root distal branch is seen in the left PCA than the right.  IMPRESSION: No acute infarction. Old left occipital infarction. Advanced chronic small vessel change of the cerebral hemispheric white matter.  No major vessel occlusion or correctable proximal stenosis. Few missing distal PCA branches on the left.   Electronically Signed  By: Nelson Chimes M.D.   On: 10/12/2013 09:18    Medications:  I have reviewed the patient's current medications. Scheduled: . aspirin EC  81 mg Oral Daily  . atenolol  100 mg Oral Daily  . atorvastatin  20 mg Oral Daily  . furosemide  20 mg Intravenous Q12H  . lisinopril  40 mg Oral QPM  . oxybutynin  5 mg Oral TID  . pantoprazole  40 mg Oral Daily  . potassium chloride  40 mEq Oral Daily  . Rivaroxaban  15 mg Oral Daily    Assessment/Plan: Patient improved today.  MRI of the brain reviewed and shows no acute changes.  MRA shows no  hemodynamically significant stenosis. Echocardiogram shows no evidence of intracardiac masses or thrombi.  Patient on Rivaroxaban.  Headache may very well have been secondary to BP.  Will check ESR and CRP as well.  Recommendations: 1.  CRP, ESR   LOS: 2 days   Alexis Goodell, MD [facility] [phone] 10/13/2013  3:33 PM

## 2013-10-13 NOTE — Progress Notes (Signed)
[facility] PROGRESS NOTE  Brittany [name] MWU:[id number] DOB: 02-Jan-1928 DOA: 10/11/2013 PCP: Mayra Neer, MD  Assessment/Plan: 1. Headache(784.0): Likely secondary to malignant hypertension. At her blood pressure has resolved, headache slowly improving.   Active Problems: 2. Hypertension: Malignant. Likely secondary to volume overload. Once Lasix started, blood pressure has since improved.   3. Atrial fibrillation: Stable on Xarelto. Rate controlled  4. S/P AVR (aortic valve replacement): Noted.  5. Dizziness  6. Acute on Chronic diastolic heart failure: Grade 2 diastolic dysfunction noted on echocardiogram. New finding in diagnosis for patient. BNP elevated and started IV Lasix last night. Patient has diuresed already over 1 L. Blood pressure in turn has responded well. Providing education for patient.  7. Atelectasis: Patient has been complaining of some chronic episodes of sharp stabbing pain underneath her left breast, most notably when she leans forward. In exam, unable to find a focal spot, but does not appear to be a hernia. Chest extremity and she does have atelectasis there. She is reportedly more sedentary. Started Biomedical engineer yesterday and since that time, she looks to have improvement  Code Status: Full code  Family Communication: Spoke to daughter yesterday  Disposition Plan: Mild bimalleolar, and should be fully diuresed by tomorrow. Likely home then  Consultants:  Neurology  Procedures:  Echocardiogram done 7/4: Grade 2 diastolic dysfunction  Antibiotics:  None  HPI/Subjective: Patient improved. Headache improving. Responding well to Lasix. Overall feeling better.  Objective: Filed Vitals:   10/13/13 1019  BP: 144/83  Pulse: 82  Temp: 97.8 F (36.6 C)  Resp: 18    Intake/Output Summary (Last 24 hours) at 10/13/13 1325 Last data filed at 10/13/13 1119  Gross per 24 hour  Intake    720 ml  Output   1700 ml  Net   -980 ml    Filed Weights   10/12/13 0800 10/13/13 0700  Weight: 68.357 kg (150 lb 11.2 oz) 68.6 kg (151 lb 3.8 oz)    Exam:   General:  Alert x3, no acute distress  Cardiovascular: Regular rate and rhythm, S1 S2,  Respiratory: Clear to auscultation bilaterally, decreased at the bases  Abdomen: Soft, nontender, nondistended, positive bowel sounds.  Musculoskeletal: No clubbing or cyanosis, no edema  Data Reviewed: Basic Metabolic Panel:  Recent Labs Lab 10/10/13 0849 10/13/13 0453  NA 136* 141  K 3.9 3.4*  CL 98 99  CO2 25 28  GLUCOSE 97 93  BUN 8 11  CREATININE 0.55 0.68  CALCIUM 9.1 9.2   Liver Function Tests:  Recent Labs Lab 10/10/13 0849  AST 29  ALT 20  ALKPHOS 90  BILITOT 0.5  PROT 6.9  ALBUMIN 3.3*   No results found for this basename: LIPASE, AMYLASE,  in the last 168 hours No results found for this basename: AMMONIA,  in the last 168 hours CBC:  Recent Labs Lab 10/10/13 0849  WBC 10.5  NEUTROABS 7.1  HGB 12.2  HCT 37.3  MCV 89.0  PLT 234   Cardiac Enzymes: No results found for this basename: CKTOTAL, CKMB, CKMBINDEX, TROPONINI,  in the last 168 hours BNP (last 3 results)  Recent Labs  10/12/13 1723 10/13/13 0453  PROBNP 2955.0* 1863.0*   CBG: No results found for this basename: GLUCAP,  in the last 168 hours  No results found for this or any previous visit (from the past 240 hour(s)).   Studies: Dg Chest 2 View  10/12/2013     IMPRESSION: 1. Small amount of patchy atelectasis or  pneumonia at the left lateral lung base. 2. Cardiomegaly and changes of COPD and chronic bronchitis.   Electronically Signed   By: Enrique Sack M.D.   On: 10/12/2013 08:53    Mr [name] Head/brain Wo Cm  10/12/2013    IMPRESSION: No acute infarction. Old left occipital infarction. Advanced chronic small vessel change of the cerebral hemispheric white matter.  No major vessel occlusion or correctable proximal stenosis. Few missing distal PCA branches on the left.    Electronically Signed   By: Nelson Chimes M.D.   On: 10/12/2013 09:18    Scheduled Meds: . aspirin  300 mg Rectal Daily   Or  . aspirin  325 mg Oral Daily  . atenolol  100 mg Oral Daily  . atorvastatin  20 mg Oral Daily  . furosemide  20 mg Intravenous Q12H  . lisinopril  40 mg Oral QPM  . oxybutynin  5 mg Oral TID  . pantoprazole  40 mg Oral Daily  . potassium chloride  40 mEq Oral Daily  . Rivaroxaban  15 mg Oral Daily   Continuous Infusions:   Principal Problem:   Headache(784.0) Active Problems:   Hypertension   Atrial fibrillation   S/P AVR (aortic valve replacement)   Dizziness   Headache   Chronic diastolic heart failure Atelectasis   Time spent: 15 minutes    [facility] Pager [phone]. If 7PM-7AM, please contact night-coverage at [email or web address], password [facility] 10/13/2013, 1:25 PM  LOS: 2 days

## 2013-10-13 NOTE — Plan of Care (Signed)
Problem: Acute Treatment Outcomes Goal: Prognosis discussed with family/patient as appropriate Outcome: Completed/Met Date Met:  10/13/13 Discussed additional tests with patients and possible discharge tomorrow pending outcome of tests.  Most likely to have home health nurse come out to home for home visit.

## 2013-10-14 LAB — BASIC METABOLIC PANEL
Anion gap: 11 (ref 5–15)
BUN: 17 mg/dL (ref 6–23)
CO2: 29 mEq/L (ref 19–32)
Calcium: 8.7 mg/dL (ref 8.4–10.5)
Chloride: 99 mEq/L (ref 96–112)
Creatinine, Ser: 0.8 mg/dL (ref 0.50–1.10)
GFR, EST AFRICAN AMERICAN: 76 mL/min — AB (ref >90)
GFR, EST NON AFRICAN AMERICAN: 66 mL/min — AB (ref >90)
Glucose, Bld: 95 mg/dL (ref 70–99)
Potassium: 4 mEq/L (ref 3.7–5.3)
Sodium: 139 mEq/L (ref 137–147)

## 2013-10-14 LAB — CBC WITH DIFFERENTIAL/PLATELET
BASOS PCT: 1 % (ref 0–1)
Basophils Absolute: 0 10*3/uL (ref 0.0–0.1)
Eosinophils Absolute: 0.2 10*3/uL (ref 0.0–0.7)
Eosinophils Relative: 3 % (ref 0–5)
HCT: 39.9 % (ref 36.0–46.0)
HEMOGLOBIN: 12.7 g/dL (ref 12.0–15.0)
LYMPHS ABS: 2.7 10*3/uL (ref 0.7–4.0)
Lymphocytes Relative: 32 % (ref 12–46)
MCH: 28.7 pg (ref 26.0–34.0)
MCHC: 31.8 g/dL (ref 30.0–36.0)
MCV: 90.1 fL (ref 78.0–100.0)
Monocytes Absolute: 1 10*3/uL (ref 0.1–1.0)
Monocytes Relative: 12 % (ref 3–12)
NEUTROS ABS: 4.4 10*3/uL (ref 1.7–7.7)
NEUTROS PCT: 52 % (ref 43–77)
Platelets: 253 10*3/uL (ref 150–400)
RBC: 4.43 MIL/uL (ref 3.87–5.11)
RDW: 13.4 % (ref 11.5–15.5)
WBC: 8.3 10*3/uL (ref 4.0–10.5)

## 2013-10-14 LAB — URINALYSIS, ROUTINE W REFLEX MICROSCOPIC
BILIRUBIN URINE: NEGATIVE
Glucose, UA: NEGATIVE mg/dL
Ketones, ur: NEGATIVE mg/dL
Nitrite: NEGATIVE
Protein, ur: NEGATIVE mg/dL
Specific Gravity, Urine: 1.008 (ref 1.005–1.030)
UROBILINOGEN UA: 0.2 mg/dL (ref 0.0–1.0)
pH: 5.5 (ref 5.0–8.0)

## 2013-10-14 LAB — PRO B NATRIURETIC PEPTIDE: Pro B Natriuretic peptide (BNP): 1455 pg/mL — ABNORMAL HIGH (ref 0–450)

## 2013-10-14 LAB — C-REACTIVE PROTEIN: CRP: <0.5 mg/dL — ABNORMAL LOW (ref <0.60)

## 2013-10-14 LAB — URINE MICROSCOPIC-ADD ON

## 2013-10-14 MED ORDER — KETOROLAC TROMETHAMINE 10 MG PO TABS
10.0000 mg | ORAL_TABLET | Freq: Once | ORAL | Status: AC
  Administered 2013-10-14: 10 mg via ORAL
  Filled 2013-10-14: qty 1

## 2013-10-14 MED ORDER — FUROSEMIDE 40 MG PO TABS
40.0000 mg | ORAL_TABLET | Freq: Two times a day (BID) | ORAL | Status: DC
  Administered 2013-10-14 – 2013-10-15 (×2): 40 mg via ORAL
  Filled 2013-10-14 (×2): qty 1

## 2013-10-14 MED ORDER — OXYCODONE HCL 5 MG PO TABS
5.0000 mg | ORAL_TABLET | Freq: Once | ORAL | Status: AC
  Administered 2013-10-14: 5 mg via ORAL
  Filled 2013-10-14: qty 1

## 2013-10-14 NOTE — Progress Notes (Signed)
[facility] PROGRESS NOTE  Brittany [name]:[id number] DOB: June 05, 1927 DOA: 10/11/2013 PCP: Mayra Neer, MD  Assessment/Plan: Active Problems: 1. Hypertension: Malignant. Likely secondary to volume overload. Once Lasix started, blood pressure has since improved. See above. Stopping her oral medications  2. Headache(784.0): initially felt to be secondary to malignant hypertension.however with headache persisting, could be musculoskeletal. Have tried one dose of when necessary Toradol. Could also be from borderline hypotension now that we have diuresed her. Blood pressure report this morning was around 90. Has stopped her other blood pressure medications for now other than Lasix.  3. Atrial fibrillation: Stable on Xarelto. Rate controlled  4. S/P AVR (aortic valve replacement): Noted.  5. Acute on Chronic diastolic heart failure: Grade 2 diastolic dysfunction noted on echocardiogram. New finding in diagnosis for patient. BNP elevated and started IV Lasix last night. Patient has diuresed already over 2 and half liters. Blood pressure in turn has responded well. Providing education for patient.  6. Atelectasis: Patient has been complaining of some chronic episodes of sharp stabbing pain underneath her left breast, most notably when she leans forward. In exam, unable to find a focal spot, but does not appear to be a hernia. Chest extremity and she does have atelectasis there. She is reportedly more sedentary. Started Biomedical engineer yesterday and since that time, she looks to have improvement  Code Status: Full code  Family Communication: left message with daughter  Disposition Plan: hopefully by tomorrow,we will fully diuresed and headache will have eased off. Then can go home  Consultants:  Neurology  Procedures:  Echocardiogram done 7/4: Grade 2 diastolic dysfunction  Antibiotics:  None  HPI/Subjective: Patient feeling rougher today. persistent headache, worse  today  Objective: Filed Vitals:   10/14/13 1759  BP: 137/61  Pulse: 78  Temp: 99 F (37.2 C)  Resp: 20    Intake/Output Summary (Last 24 hours) at 10/14/13 1916 Last data filed at 10/14/13 1707  Gross per 24 hour  Intake    600 ml  Output   2200 ml  Net  -1600 ml   Filed Weights   10/12/13 0800 10/13/13 0700 10/14/13 0605  Weight: 68.357 kg (150 lb 11.2 oz) 68.6 kg (151 lb 3.8 oz) 67.7 kg (149 lb 4 oz)    Exam:   General:  Alert x3, mild distress secondary to headache  Cardiovascular: Regular rate and rhythm, S1 S2,  Respiratory: Clear to auscultation bilaterally, decreased at the bases  Abdomen: Soft, nontender, nondistended, positive bowel sounds.  Musculoskeletal: No clubbing or cyanosis, no edema  Data Reviewed: Basic Metabolic Panel:  Recent Labs Lab 10/10/13 0849 10/13/13 0453 10/14/13 0633  NA 136* 141 139  K 3.9 3.4* 4.0  CL 98 99 99  CO2 25 28 29   GLUCOSE 97 93 95  BUN 8 11 17   CREATININE 0.55 0.68 0.80  CALCIUM 9.1 9.2 8.7   Liver Function Tests:  Recent Labs Lab 10/10/13 0849  AST 29  ALT 20  ALKPHOS 90  BILITOT 0.5  PROT 6.9  ALBUMIN 3.3*   No results found for this basename: LIPASE, AMYLASE,  in the last 168 hours No results found for this basename: AMMONIA,  in the last 168 hours CBC:  Recent Labs Lab 10/10/13 0849 10/14/13 0633  WBC 10.5 8.3  NEUTROABS 7.1 4.4  HGB 12.2 12.7  HCT 37.3 39.9  MCV 89.0 90.1  PLT 234 253   Cardiac Enzymes: No results found for this basename: CKTOTAL, CKMB, CKMBINDEX, TROPONINI,  in the  last 168 hours BNP (last 3 results)  Recent Labs  10/12/13 1723 10/13/13 0453 10/14/13 0923  PROBNP 2955.0* 1863.0* 1455.0*   CBG: No results found for this basename: GLUCAP,  in the last 168 hours  No results found for this or any previous visit (from the past 240 hour(s)).   Studies: Dg Chest 2 View  10/12/2013     IMPRESSION: 1. Small amount of patchy atelectasis or pneumonia at the left  lateral lung base. 2. Cardiomegaly and changes of COPD and chronic bronchitis.   Electronically Signed   By: Enrique Sack M.D.   On: 10/12/2013 08:53    Mr [name] Head/brain Wo Cm  10/12/2013    IMPRESSION: No acute infarction. Old left occipital infarction. Advanced chronic small vessel change of the cerebral hemispheric white matter.  No major vessel occlusion or correctable proximal stenosis. Few missing distal PCA branches on the left.   Electronically Signed   By: Nelson Chimes M.D.   On: 10/12/2013 09:18    Scheduled Meds: . atorvastatin  20 mg Oral Daily  . furosemide  40 mg Oral BID  . oxybutynin  5 mg Oral TID  . pantoprazole  40 mg Oral Daily  . potassium chloride  40 mEq Oral Daily  . Rivaroxaban  15 mg Oral Daily   Continuous Infusions:   Principal Problem:   Headache(784.0) Active Problems:   Hypertension   Atrial fibrillation   S/P AVR (aortic valve replacement)   Dizziness   Headache   Chronic diastolic heart failure Atelectasis   Time spent: 25 minutes    [facility] Pager [phone]. If 7PM-7AM, please contact night-coverage at [email or web address], password [facility] 10/14/2013, 7:16 PM  LOS: 3 days

## 2013-10-14 NOTE — Progress Notes (Signed)
CARE MANAGEMENT NOTE 10/14/2013  Patient:  Brittany [name], Brittany [name]   Account Number:  [id number]  Date Initiated:  10/14/2013  Documentation initiated by:  Olga Coaster  Subjective/Objective Assessment:   ADMITTED WITH HTN CRISIS, TIA     Action/Plan:   CM FOLLOWING FOR DCP   Anticipated DC Date:  10/17/2013   Anticipated DC Plan:  [place]  CM consult          Status of service:  In process, will continue to follow Medicare Important Message given?  YES (If response is "NO", the following Medicare IM given date fields will be blank) Date Medicare IM given:  10/14/2013 Medicare IM given by:  Olga Coaster Date Additional Medicare IM given:   Additional Medicare IM given by:    Comments:  7/6/2015Mindi Slicker RN,BSN,MHA [phone]

## 2013-10-14 NOTE — Progress Notes (Signed)
MD made aware of pt's BP of 97/47. No new orders received at this time.

## 2013-10-14 NOTE — Progress Notes (Signed)
MD aware of pt's BP. No new orders given.

## 2013-10-14 NOTE — Progress Notes (Signed)
Subjective: Patient reports that headache returned last night and is severe again.  BP has no been elevated.    Objective: Current vital signs: BP 105/44  Pulse 77  Temp(Src) 98.2 F (36.8 C) (Oral)  Resp 18  Ht $R'5\' 1"'dM$  (1.549 m)  Wt 67.7 kg (149 lb 4 oz)  BMI 28.22 kg/m2  SpO2 92% Vital signs in last 24 hours: Temp:  [97.8 F (36.6 C)-98.5 F (36.9 C)] 98.2 F (36.8 C) (07/06 6295) Pulse Rate:  [69-82] 77 (07/06 0937) Resp:  [16-18] 18 (07/06 0937) BP: (97-134)/(44-78) 105/44 mmHg (07/06 1150) SpO2:  [92 %-99 %] 92 % (07/06 0937) Weight:  [67.7 kg (149 lb 4 oz)] 67.7 kg (149 lb 4 oz) (07/06 0605)  Intake/Output from previous day: 07/05 0701 - 07/06 0700 In: 1600 [P.O.:1600] Out: 2600 [Urine:2600] Intake/Output this shift: Total I/O In: 360 [P.O.:360] Out: 375 [Urine:375] Nutritional status: Cardiac  Neurologic Exam: Mental Status:  Alert, oriented, thought content appropriate. Speech fluent without evidence of aphasia. Able to follow 3 step commands without difficulty.  Cranial Nerves:  II: Discs flat bilaterally; right homonymous hemianopsis , pupils equal, round, reactive to light and accommodation  III,IV, VI: ptosis not present, extra-ocular motions intact bilaterally  V,VII: smile symmetric, facial light touch sensation normal bilaterally  VIII: hearing normal bilaterally  IX,X: gag reflex present  XI: bilateral shoulder shrug  XII: midline tongue extension without atrophy or fasciculations  Motor:  5/5 throughout  Sensory: Pinprick and light touch intact throughout, bilaterally  Deep Tendon Reflexes:  2+ throughout  Plantars:  Right: downgoing   Left: downgoing  Cerebellar:  normal finger-to-nose, normal heel-to-shin test    Lab Results: Basic Metabolic Panel:  Recent Labs Lab 10/10/13 0849 10/13/13 0453 10/14/13 0633  NA 136* 141 139  K 3.9 3.4* 4.0  CL 98 99 99  CO2 $Re'25 28 29  'Eyz$ GLUCOSE 97 93 95  BUN $Re'8 11 17  'Ley$ CREATININE 0.55 0.68 0.80   CALCIUM 9.1 9.2 8.7    Liver Function Tests:  Recent Labs Lab 10/10/13 0849  AST 29  ALT 20  ALKPHOS 90  BILITOT 0.5  PROT 6.9  ALBUMIN 3.3*   No results found for this basename: LIPASE, AMYLASE,  in the last 168 hours No results found for this basename: AMMONIA,  in the last 168 hours  CBC:  Recent Labs Lab 10/10/13 0849 10/14/13 0633  WBC 10.5 8.3  NEUTROABS 7.1 4.4  HGB 12.2 12.7  HCT 37.3 39.9  MCV 89.0 90.1  PLT 234 253    Cardiac Enzymes: No results found for this basename: CKTOTAL, CKMB, CKMBINDEX, TROPONINI,  in the last 168 hours  Lipid Panel:  Recent Labs Lab 10/12/13 0525  CHOL 159  TRIG 53  HDL 62  CHOLHDL 2.6  VLDL 11  LDLCALC 86    CBG: No results found for this basename: GLUCAP,  in the last 168 hours  Microbiology: No results found for this or any previous visit.  Coagulation Studies: No results found for this basename: LABPROT, INR,  in the last 72 hours  Imaging: No results found.  Medications:  I have reviewed the patient's current medications. Scheduled: . atenolol  100 mg Oral Daily  . atorvastatin  20 mg Oral Daily  . furosemide  20 mg Intravenous Q12H  . lisinopril  40 mg Oral QPM  . oxybutynin  5 mg Oral TID  . pantoprazole  40 mg Oral Daily  . potassium chloride  40 mEq Oral Daily  .  Rivaroxaban  15 mg Oral Daily    Assessment/Plan: Patient with return of headache.  Neurological examination remains stable.  Imaging has been unrevealing.  ESR and CRP are normal.    Recommendations: 1.  Headache recurrent.  May benefit from something with anti-inflammatory characteristics (patient complains of some neck pain component as well).   .  No further neurologic intervention is recommended at this time.  If further questions arise, please call or page at that time.  Thank you for allowing neurology to participate in the care of this patient.    LOS: 3 days   Alexis Goodell, MD [facility] [phone] 10/14/2013  1:18 PM

## 2013-10-15 ENCOUNTER — Telehealth: Payer: Self-pay | Admitting: Cardiovascular Disease

## 2013-10-15 LAB — URINE CULTURE: Colony Count: 6000

## 2013-10-15 LAB — SEDIMENTATION RATE: Sed Rate: 18 mm/hr (ref 0–22)

## 2013-10-15 MED ORDER — LIVING BETTER WITH HEART FAILURE BOOK
Freq: Once | Status: DC
  Filled 2013-10-15: qty 1

## 2013-10-15 MED ORDER — RIVAROXABAN 15 MG PO TABS
15.0000 mg | ORAL_TABLET | Freq: Every day | ORAL | Status: DC
  Administered 2013-10-16: 15 mg via ORAL
  Filled 2013-10-15: qty 1

## 2013-10-15 NOTE — Progress Notes (Signed)
Subjective: Stating she continues to have a 6/10 HA.   Objective: Current vital signs: BP 134/73  Pulse 78  Temp(Src) 98.4 F (36.9 C) (Oral)  Resp 18  Ht 5\' 1"  (1.549 m)  Wt 69.1 kg (152 lb 5.4 oz)  BMI 28.80 kg/m2  SpO2 96% Vital signs in last 24 hours: Temp:  [98.3 F (36.8 C)-99 F (37.2 C)] 98.4 F (36.9 C) (07/07 0933) Pulse Rate:  [65-85] 78 (07/07 0933) Resp:  [16-20] 18 (07/07 0933) BP: (104-137)/(41-73) 134/73 mmHg (07/07 0933) SpO2:  [93 %-97 %] 96 % (07/07 0933) Weight:  [69.1 kg (152 lb 5.4 oz)] 69.1 kg (152 lb 5.4 oz) (07/07 0656)  Intake/Output from previous day: 07/06 0701 - 07/07 0700 In: 360 [P.O.:360] Out: 1475 [Urine:1475] Intake/Output this shift: Total I/O In: 480 [P.O.:480] Out: 300 [Urine:300] Nutritional status: Cardiac  Neurologic Exam: Mental Status: Alert, oriented, thought content appropriate.  Speech fluent without evidence of aphasia.  Able to follow 3 step commands without difficulty. Cranial Nerves: II: Discs flat bilaterally; right homonymous hemianopsia,  pupils equal, round, reactive to light and accommodation III,IV, VI: ptosis not present, extra-ocular motions intact bilaterally V,VII: smile symmetric, facial light touch sensation normal bilaterally VIII: hearing normal bilaterally IX,X: gag reflex present XI: bilateral shoulder shrug XII: midline tongue extension without atrophy or fasciculations  Motor: Right : Upper extremity   5/5    Left:     Upper extremity   5/5  Lower extremity   5/5     Lower extremity   5/5 ( old drop foot) Tone and bulk:normal tone throughout; no atrophy noted Sensory: Pinprick and light touch intact throughout, bilaterally Deep Tendon Reflexes:  Right: Upper Extremity   Left: Upper extremity   biceps (C-5 to C-6) 2/4   biceps (C-5 to C-6) 2/4 tricep (C7) 2/4    triceps (C7) 2/4 Brachioradialis (C6) 2/4  Brachioradialis (C6) 2/4  Lower Extremity Lower Extremity  quadriceps (L-2 to L-4) 2/4    quadriceps (L-2 to L-4) 2/4 Achilles (S1) 2/4   Achilles (S1) 2/4  Plantars: Right: downgoing   Left: downgoing Cerebellar: normal finger-to-nose,  normal heel-to-shin test    Lab Results: Basic Metabolic Panel:  Recent Labs Lab 10/10/13 0849 10/13/13 0453 10/14/13 0633  NA 136* 141 139  K 3.9 3.4* 4.0  CL 98 99 99  CO2 25 28 29   GLUCOSE 97 93 95  BUN 8 11 17   CREATININE 0.55 0.68 0.80  CALCIUM 9.1 9.2 8.7    Liver Function Tests:  Recent Labs Lab 10/10/13 0849  AST 29  ALT 20  ALKPHOS 90  BILITOT 0.5  PROT 6.9  ALBUMIN 3.3*   No results found for this basename: LIPASE, AMYLASE,  in the last 168 hours No results found for this basename: AMMONIA,  in the last 168 hours  CBC:  Recent Labs Lab 10/10/13 0849 10/14/13 0633  WBC 10.5 8.3  NEUTROABS 7.1 4.4  HGB 12.2 12.7  HCT 37.3 39.9  MCV 89.0 90.1  PLT 234 253    Cardiac Enzymes: No results found for this basename: CKTOTAL, CKMB, CKMBINDEX, TROPONINI,  in the last 168 hours  Lipid Panel:  Recent Labs Lab 10/12/13 0525  CHOL 159  TRIG 53  HDL 62  CHOLHDL 2.6  VLDL 11  LDLCALC 86    CBG: No results found for this basename: GLUCAP,  in the last 168 hours  Microbiology: Results for orders placed during the hospital encounter of 10/11/13  URINE CULTURE  Status: None   Collection Time    10/13/13 11:40 PM      Result Value Ref Range Status   Specimen Description URINE, CLEAN CATCH   Final   Special Requests NONE   Final   Culture  Setup Time     Final   Value: 10/14/2013 04:51     Performed at [facility]     Final   Value: 6,000 COLONIES/ML     Performed at [facility]   Culture     Final   Value: INSIGNIFICANT GROWTH     Performed at [facility]   Report Status 10/15/2013 FINAL   Final    Coagulation Studies: No results found for this basename: LABPROT, INR,  in the last 72 hours  Imaging: No results found.  Medications:   Scheduled: . atorvastatin  20 mg Oral Daily  . furosemide  40 mg Oral BID  . oxybutynin  5 mg Oral TID  . pantoprazole  40 mg Oral Daily  . potassium chloride  40 mEq Oral Daily  . Rivaroxaban  15 mg Oral Daily    Assessment/Plan: Patient continues to have headache. Neurological examination remains normal.  Recommendations:  1) treat HA symptomatically   No further neurologic intervention is recommended at this time. If further questions arise, please call or page at that time. Thank you for allowing neurology to participate in the care of this patient.   Etta Quill PA-C [facility] [phone]  10/15/2013, 1:12 PM

## 2013-10-15 NOTE — Progress Notes (Signed)
[facility] PROGRESS NOTE  Brittany [name] XFG:[id number] DOB: 1927/08/16 DOA: 10/11/2013 PCP: Brittany Neer, MD  Interim summary:  81 year old white female presented on 7/3 with complaints of headache and markedly elevated blood pressures.  Initially thought to be stroke which was ruled out. Found to be in acute diastolic heart failure (new diagnosis for patient) and with Lasix, blood pressures improved significantly and headache resolved. As she has been fully diuresed, blood pressures have been slightly soft-systolic 90, headache has returned. Persists despite treatment trying Tylenol, narcotics or NSAIDs. ESR unremarkable. Suspect is secondary to lower blood pressure. Has stopped all antihypertensives including Lasix and will see if headache improves as pressure consistently stays above 100.  Assessment/Plan: Active Problems: 1. Hypertension: Malignant. Likely secondary to volume overload. Once Lasix started, blood pressure has since improved. See above. Stopping her oral medications  2. Headache(784.0): initially felt to be secondary to malignant hypertension.however with headache persisting. Could be from borderline hypotension now that we have diuresed her. Blood pressure report this morning was around 90. Has stopped her other blood pressure medications and Lasix for now. Check ESR to make sure this was not temporal arteritis. Level normal.  3. Atrial fibrillation: Stable on Xarelto. Rate controlled  4. S/P AVR (aortic valve replacement): Noted.  5. Acute on Chronic diastolic heart failure: Grade 2 diastolic dysfunction noted on echocardiogram. New finding in diagnosis for patient. BNP elevated and started IV Lasix last night. Patient has diuresed 3 L. Blood pressure in turn has responded well. Providing education for patient.  Family asked cardiology be notified patient is in hospital  6. Atelectasis: Patient has been complaining of some chronic episodes of sharp stabbing pain  underneath her left breast, most notably when she leans forward. In exam, unable to find a focal spot, but does not appear to be a hernia. Chest extremity and she does have atelectasis there. She is reportedly more sedentary. 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 Then can go home  Consultants:  Neurology  Cardiology  Procedures:  Echocardiogram done 7/4: Grade 2 diastolic dysfunction  Antibiotics:  None  HPI/Subjective: Patient continues to have persistent headache, located mostly in the frontal right area. Described as a pounding. Not really associated with time of day, hunger or positioning  Objective: Filed Vitals:   10/15/13 1800  BP: 139/84  Pulse: 100  Temp: 99.4 F (37.4 C)  Resp: 18    Intake/Output Summary (Last 24 hours) at 10/15/13 2107 Last data filed at 10/15/13 1923  Gross per 24 hour  Intake    960 ml  Output   1325 ml  Net   -365 ml   Filed Weights   10/13/13 0700 10/14/13 0605 10/15/13 0656  Weight: 68.6 kg (151 lb 3.8 oz) 67.7 kg (149 lb 4 oz) 69.1 kg (152 lb 5.4 oz)    Exam:   General:  Alert x3, fatigue  Cardiovascular: Regular rate and rhythm, S1 S2, 2/6 systolic ejection murmur  Respiratory: Clear to auscultation bilaterally.  Abdomen: Soft, nontender, nondistended, positive bowel sounds.  Musculoskeletal: No clubbing or cyanosis, no edema  Data Reviewed: Basic Metabolic Panel:  Recent Labs Lab 10/10/13 0849 10/13/13 0453 10/14/13 0633  NA 136* 141 139  K 3.9 3.4* 4.0  CL 98 99 99  CO2 _0 GLUCOSE 97 93 95  BUN 8  11 17  CREATININE 0.55 0.68 0.80  CALCIUM 9.1 9.2 8.7   Liver Function Tests:  Recent Labs Lab 10/10/13 0849  AST 29  ALT 20  ALKPHOS 90  BILITOT 0.5  PROT 6.9  ALBUMIN 3.3*   No results found for  this basename: LIPASE, AMYLASE,  in the last 168 hours No results found for this basename: AMMONIA,  in the last 168 hours CBC:  Recent Labs Lab 10/10/13 0849 10/14/13 0633  WBC 10.5 8.3  NEUTROABS 7.1 4.4  HGB 12.2 12.7  HCT 37.3 39.9  MCV 89.0 90.1  PLT 234 253   Cardiac Enzymes: No results found for this basename: CKTOTAL, CKMB, CKMBINDEX, TROPONINI,  in the last 168 hours BNP (last 3 results)  Recent Labs  10/12/13 1723 10/13/13 0453 10/14/13 0923  PROBNP 2955.0* 1863.0* 1455.0*   CBG: No results found for this basename: GLUCAP,  in the last 168 hours  Recent Results (from the past 240 hour(s))  URINE CULTURE     Status: None   Collection Time    10/13/13 11:40 PM      Result Value Ref Range Status   Specimen Description URINE, CLEAN CATCH   Final   Special Requests NONE   Final   Culture  Setup Time     Final   Value: 10/14/2013 04:51     Performed at [facility]     Final   Value: 6,000 COLONIES/ML     Performed at [facility]   Culture     Final   Value: INSIGNIFICANT GROWTH     Performed at [facility]   Report Status 10/15/2013 FINAL   Final     Studies: Dg Chest 2 View  10/12/2013     IMPRESSION: 1. Small amount of patchy atelectasis or pneumonia at the left lateral lung base. 2. Cardiomegaly and changes of COPD and chronic bronchitis.   Electronically Signed   By: Enrique Sack M.D.   On: 10/12/2013 08:53    Mr [name] Head/brain Wo Cm  10/12/2013    IMPRESSION: No acute infarction. Old left occipital infarction. Advanced chronic small vessel change of the cerebral hemispheric white matter.  No major vessel occlusion or correctable proximal stenosis. Few missing distal PCA branches on the left.   Electronically Signed   By: Nelson Chimes M.D.   On: 10/12/2013 09:18    Scheduled Meds: . atorvastatin  20 mg Oral Daily  . Living Better with Heart Failure Book   Does not apply Once  . oxybutynin  5 mg Oral TID  .  pantoprazole  40 mg Oral Daily  . potassium chloride  40 mEq Oral Daily  . [START ON 10/16/2013] Rivaroxaban  15 mg Oral Q breakfast   Continuous Infusions:   Principal Problem:   Headache(784.0) Active Problems:   Hypertension   Atrial fibrillation   S/P AVR (aortic valve replacement)   Dizziness   Headache   Chronic diastolic heart failure Atelectasis   Time spent: 25 minutes    [facility] Pager [phone]. If 7PM-7AM, please contact night-coverage at [email or web address], password [facility] 10/15/2013, 9:07 PM  LOS: 4 days

## 2013-10-15 NOTE — Telephone Encounter (Signed)
New message     Daughter want Dr Acie Fredrickson to know that pt has been in the hosp since sat and she is not getting better.  She is in room [id number]

## 2013-10-15 NOTE — Consult Note (Signed)
CARDIOLOGY CONSULT NOTE   Patient ID: Brittany [name] MRN: [id number], DOB/AGE: 78-Jul-1929   Admit date: 10/11/2013 Date of Consult: 10/15/2013   Primary Physician: Mayra Neer, MD Primary Cardiologist: Dr. Acie Fredrickson  Pt. Profile  80 year old Caucasian female with past medical history significant for hypertension, hyperlipidemia, history of CVA with left foot drop, permanent atrial fibrillation on Xarelto for the last 2 years, and history of aortic valve replacement with bioprosthetic valve in 2005 presented with weakness and dizziness, r/o for stroke. proBNP elevated >2900, diuresed well. Cardiology consulted for diastolic CHF  Problem List  Past Medical History  Diagnosis Date  . Dyslipidemia     takes Lipitor daily  . Arthritis   . Urine incontinence   . Diverticulosis   . IC (interstitial cystitis)   . S/P aortic valve replacement     2005  . H/O hiatal hernia   . History of TIA (transient ischemic attack)     2013-- RESIDUAL PERIPHERAL VISION RIGHT EYE--  RESOLVED  . History of bacterial endocarditis   . Foot drop     SINCE 1987  . History of gastritis   . Chronic back pain   . Hypertension   . Atrial fibrillation   . GERD (gastroesophageal reflux disease)     Past Surgical History  Procedure Laterality Date  . Abdominal hysterectomy  1967  . Aortic valve replacement  09-29-2003     [facility] pericardial tissue valve  . Cholecystectomy  1993  . Vein ligation  1969    RIGHT LOWER LEG  . Dilation and curettage of uterus    . Ercp N/A 08/10/2012    Procedure: ENDOSCOPIC RETROGRADE CHOLANGIOPANCREATOGRAPHY (ERCP);  Surgeon: Inda Castle, MD;  Location: [facility];  Service: Gastroenterology;  Laterality: N/A;  . Cataract extraction w/ intraocular lens  implant, bilateral    . Appendectomy  1933  . Cardiac catheterization  07-15-2003 DR HELEN PRESTON    MODERATE TO MODERATELY SEVERE CALCIFIC AORTIC STENOSIS/  NORMAL CORONARY ARTERIES  . Right shoulder  arthroscopy w/ debridement rotator cuff and labral tear/ acrominoplasty/ distal clavicle excision/ ca ligament release  10-08-1999  . Cysto/ hydrodistention/ instillation clorpactin  MULTIPLE  last one 2009  . Mini-open right rotator cuff repair  07-12-2001  . Transvaginal tape procedure  07-30-2002  . Lumbar disc surgery  1985 &  2007  . Macroplastique urethral implantation  08-27-2009  . Transthoracic echocardiogram  08-10-2011    MILD LVH/  EF 55-60%/  NORMAL AVR TISSUE/ MILD MR/  MODERATE DILATED LA/  MILD DILATED RA  . Cardiovascular stress test  01-30-2012  DR NASHER    NORMAL NUCLEAR STUDY/  EF 73%/  NORMAL LVF  . Tonsillectomy and adenoidectomy    . Carpal tunnel release Bilateral   . Cysto with hydrodistension N/A 02/05/2013    Procedure: CYSTOSCOPY/HYDRODISTENSION;  Surgeon: Irine Seal, MD;  Location: [facility];  Service: Urology;  Laterality: N/A;     Allergies  Allergies  Allergen Reactions  . Amoxicillin Diarrhea and Nausea And Vomiting  . Cephalexin Diarrhea  . Codeine Nausea Only  . Morphine And Related Other (See Comments)    HX ADDICTION / WITHDRAWAL  . Neurontin [Gabapentin] Swelling    Legs swell  . Nitrofurantoin Monohyd Macro Diarrhea and Nausea Only  . Prednisone Nausea Only  . Sulfa Antibiotics Rash    HPI   The patient is an 80 year old Caucasian female with past medical history significant for hypertension, hyperlipidemia, history of CVA with  left foot drop, permanent atrial fibrillation on Xarelto for the last 2 years, and history of atrial valve replacement with in 2005. According to the patient, she has been very active at home, even more so recently due to wedding in the family. She states, she goes where her she wants, however she did notice increasing dyspnea on exertion in the recent month. In the last 8 weeks, patient also gained about 8 pounds from her baseline of 145. She states she has been compliant with medication. Her last  echocardiogram on 08/10/2011 showed normal EF 55-60%, mild LVH. She had a negative nuclear stress test in 2013. She denies any recent exertional chest discomfort, lower extremity edema, orthopnea or paroxysmal nocturnal dyspnea. However she does sleep on elevated angle. She states she does not eat a lot of salty food, however she does like [place] food. Her last visit with Dr. Letta Pate was on 08/27/2013 at which time she was doing well without any symptoms. Her atrial fibrillation was well controlled with atenolol.   Patient presented to [facility] on 10/11/2013 with complaint of intermittent frontal headache for the past 3 days associated with uncontrolled blood pressure. She was also noted to have some mild dizziness with ambulation. Initial CBC and CMP were normal with creatinine of 0.55. Initial EKG showed atrial fibrillation with heart rate in the 80-90s. Chest x-ray was obtained which showed cardiomegaly with changes of COPD and chronic bronchitis, small amount patchy atelectasis in the left lateral lung base. Patient has severely elevated blood pressure on arrival. She was given IV fluid, Reglan, Benadryl and lisinopril with mild improvement of the headache. However since she continued to exhibit dizziness with ambulation, hospitalist group and the neurology was consulted. CT of the head showed no acute process. MRI of the brain showed no acute infarct, old left occipital infarct, no major vessel occlusion, with a few minutes distal PCA branches on the left. During her hospital stay, patient continued to exhibit headache even after blood pressure was controlled. Of note, patient also has been complaining of right upper quadrant pain however she had a history of cholecystectomy. ProBNP was obtained on 10/12/2013 which was elevated at 2900. Patient was placed on IV Lasix which was transitioned to PO Lasix with good diuresis and a -3 L so far. Echocardiogram was obtained on 7/4 which showed EF 60-65%, moderate  LVH, grade 2 diastolic dysfunction, [facility] pericardial valve present. Cardiology was consulted for acute diastolic heart failure.  Inpatient Medications  . atorvastatin  20 mg Oral Daily  . furosemide  40 mg Oral BID  . oxybutynin  5 mg Oral TID  . pantoprazole  40 mg Oral Daily  . potassium chloride  40 mEq Oral Daily  . [START ON 10/16/2013] Rivaroxaban  15 mg Oral Q breakfast    Family History Family History  Problem Relation Age of Onset  . Heart disease Father   . Coronary artery disease Brother   . Bipolar disorder Brother     commited suiside at 53yo  . Alcohol abuse Sister     sister #1  . Alcohol abuse Sister     sister #2  . Colon cancer Maternal Aunt   . Stomach cancer Maternal Grandmother      Social History History   Social History  . Marital Status: Widowed    Spouse Name: N/A    Number of Children: 4  . Years of Education: N/A   Occupational History  . retired    Science writer  History Main Topics  . Smoking status: Former Smoker    Types: Cigarettes    Quit date: 03/06/1990  . Smokeless tobacco: Never Used  . Alcohol Use: No  . Drug Use: No  . Sexual Activity: Not on file   Other Topics Concern  . Not on file   Social History Narrative  . No narrative on file     Review of Systems  General:  No chills, fever, night sweats or weight changes.  Cardiovascular:  No chest pain, edema, orthopnea, palpitations, paroxysmal nocturnal dyspnea. +dyspnea on exertion Dermatological: No rash, lesions/masses Respiratory: No cough, dyspnea Urologic: No hematuria, dysuria Abdominal:   No nausea, vomiting, diarrhea, bright red blood per rectum, melena, or hematemesis. RUQ pain. Neurologic:  No visual changes, wkns, changes in mental status. +headache All other systems reviewed and are otherwise negative except as noted above.  Physical Exam  Blood pressure 91/55, pulse 87, temperature 98.4 F (36.9 C), temperature source Oral, resp. rate 18, height 5\' 1"   (1.549 m), weight 152 lb 5.4 oz (69.1 kg), SpO2 96.00%.  General: Pleasant, NAD Psych: Normal affect. Neuro: Alert and oriented X 3. Moves all extremities spontaneously. HEENT: Normal  Neck: Supple without bruits or JVD. Lungs:  Resp regular and unlabored, CTA. Heart: irregular no s3, s4, or murmurs. Abdomen: Soft, non-tender, non-distended, BS + x 4.  Extremities: No clubbing, cyanosis or edema. DP/PT/Radials 2+ and equal bilaterally.  Labs  No results found for this basename: CKTOTAL, CKMB, TROPONINI,  in the last 72 hours Lab Results  Component Value Date   WBC 8.3 10/14/2013   HGB 12.7 10/14/2013   HCT 39.9 10/14/2013   MCV 90.1 10/14/2013   PLT 253 10/14/2013    Recent Labs Lab 10/10/13 0849  10/14/13 0633  NA 136*  < > 139  K 3.9  < > 4.0  CL 98  < > 99  CO2 25  < > 29  BUN 8  < > 17  CREATININE 0.55  < > 0.80  CALCIUM 9.1  < > 8.7  PROT 6.9  --   --   BILITOT 0.5  --   --   ALKPHOS 90  --   --   ALT 20  --   --   AST 29  --   --   GLUCOSE 97  < > 95  < > = values in this interval not displayed. Lab Results  Component Value Date   CHOL 159 10/12/2013   HDL 62 10/12/2013   LDLCALC 86 10/12/2013   TRIG 53 10/12/2013   No results found for this basename: DDIMER    Radiology/Studies  Dg Chest 2 View  10/12/2013   CLINICAL DATA:  Stroke.  EXAM: CHEST  2 VIEW  COMPARISON:  08/08/2012.  Abdomen CT dated 02/03/2012.  FINDINGS: Stable enlarged cardiac silhouette, median sternotomy wires and prosthetic heart valve. Curvilinear mitral valve annulus calcification on the left. Mildly prominent pulmonary vasculature and interstitial markings. The lungs are mildly hyperexpanded and mild diffuse peribronchial thickening is noted. Interval small amount of patchy density at the left lateral lung base. Diffuse osteopenia.  IMPRESSION: 1. Small amount of patchy atelectasis or pneumonia at the left lateral lung base. 2. Cardiomegaly and changes of COPD and chronic bronchitis.   Electronically  Signed   By: Enrique Sack M.D.   On: 10/12/2013 08:53   Ct Head Wo Contrast  10/10/2013   CLINICAL DATA:  Headache, hypertension  EXAM: CT HEAD WITHOUT CONTRAST  TECHNIQUE: Contiguous axial images were obtained from the base of the skull through the vertex without intravenous contrast.  COMPARISON:  MRI 07/23/2011  FINDINGS: Mild atrophy. Moderate chronic microvascular ischemic change throughout the white matter. Chronic left medial occipital infarct.  Negative for acute infarct, hemorrhage, or mass. Atherosclerotic disease is present. No focal bony abnormality.  IMPRESSION: Chronic ischemic change.  No acute abnormality.   Electronically Signed   By: Franchot Gallo M.D.   On: 10/10/2013 09:36   Mr Brain Wo Contrast  10/12/2013   CLINICAL DATA:  Stroke.  EXAM: MRI HEAD WITHOUT CONTRAST  MRA HEAD WITHOUT CONTRAST  TECHNIQUE: Multiplanar, multiecho pulse sequences of the brain and surrounding structures were obtained without intravenous contrast. Angiographic images of the head were obtained using MRA technique without contrast.  COMPARISON:  Head CT 10/10/2013.  MRI 07/23/2011.  FINDINGS: MRI HEAD FINDINGS  Diffusion imaging does not show any acute or subacute infarction. There are mild chronic small-vessel changes affecting the pons. No focal cerebellar insult. The cerebral hemispheres show an old cortical infarction in the left occipital lobe which has progressed to atrophy and encephalomalacia. There are confluent chronic small vessel ischemic changes throughout the cerebral hemispheric white matter. No mass lesion, acute hemorrhage, hydrocephalus or extra-axial collection. Minimal hemosiderin is residual in the region of the left occipital infarction and in a few of the areas of chronic white matter infarction. No pituitary mass. No inflammatory sinus disease. No skull or skullbase lesion.  MRA HEAD FINDINGS  Both internal carotid arteries are widely patent into the brain. The anterior and middle cerebral  vessels are normal without proximal stenosis, aneurysm or vascular malformation.  Both vertebral arteries are widely patent to the basilar. No basilar stenosis. Posterior circulation branch vessels are patent, but there are few root distal branch is seen in the left PCA than the right.  IMPRESSION: No acute infarction. Old left occipital infarction. Advanced chronic small vessel change of the cerebral hemispheric white matter.  No major vessel occlusion or correctable proximal stenosis. Few missing distal PCA branches on the left.   Electronically Signed   By: Nelson Chimes M.D.   On: 10/12/2013 09:18   Mr [name] Head/brain Wo Cm  10/12/2013   CLINICAL DATA:  Stroke.  EXAM: MRI HEAD WITHOUT CONTRAST  MRA HEAD WITHOUT CONTRAST  TECHNIQUE: Multiplanar, multiecho pulse sequences of the brain and surrounding structures were obtained without intravenous contrast. Angiographic images of the head were obtained using MRA technique without contrast.  COMPARISON:  Head CT 10/10/2013.  MRI 07/23/2011.  FINDINGS: MRI HEAD FINDINGS  Diffusion imaging does not show any acute or subacute infarction. There are mild chronic small-vessel changes affecting the pons. No focal cerebellar insult. The cerebral hemispheres show an old cortical infarction in the left occipital lobe which has progressed to atrophy and encephalomalacia. There are confluent chronic small vessel ischemic changes throughout the cerebral hemispheric white matter. No mass lesion, acute hemorrhage, hydrocephalus or extra-axial collection. Minimal hemosiderin is residual in the region of the left occipital infarction and in a few of the areas of chronic white matter infarction. No pituitary mass. No inflammatory sinus disease. No skull or skullbase lesion.  MRA HEAD FINDINGS  Both internal carotid arteries are widely patent into the brain. The anterior and middle cerebral vessels are normal without proximal stenosis, aneurysm or vascular malformation.  Both vertebral  arteries are widely patent to the basilar. No basilar stenosis. Posterior circulation branch vessels are patent, but there are few root distal  branch is seen in the left PCA than the right.  IMPRESSION: No acute infarction. Old left occipital infarction. Advanced chronic small vessel change of the cerebral hemispheric white matter.  No major vessel occlusion or correctable proximal stenosis. Few missing distal PCA branches on the left.   Electronically Signed   By: Nelson Chimes M.D.   On: 10/12/2013 09:18    ECG  Atrial fibrillation with HR 80s  ASSESSMENT AND PLAN  1. Acute on chronic diastolic HF  - well diuresed, -2L  - need better BP control, unclear what caused her SBP to go up  - will need dietary discretion  - continue her BP once SBP improve  2. Malignant HTN 3. Headache: unclear cause 4. RUQ pain: possibly hepatic congestion, however LFT normal 5. Permanent a-fib, on xarelto. Atenalol held due to hypotension, will need to restart once able 6. atelectesis 7. AVR  Signed, Almyra Deforest, PA-C 10/15/2013, 4:42 PM  Patient seen and examined with Almyra Deforest, PA-C PA-C. We discussed all aspects of the encounter. I agree with the assessment and plan as stated above.   Volume status looks good after diuresis. Suspect HTN and volume overload related to dietary indiscretion (she eats a lot of fried rice). She is not on any lasix at home so I would recommend stopping po lasix tomorrow and just using lasix 40 mg daily prn (with kcl 20) for weight gain of 3 more pounds. Counseled on need to be more careful with her diet. BP looks good. AF well controlled. Continue Xarelto  We will follow at a distance. Please call with questions.    Benay Spice 6:49 PM

## 2013-10-15 NOTE — Telephone Encounter (Signed)
Our team has already been consulted.  Thank you for also letting us know.

## 2013-10-15 NOTE — Telephone Encounter (Signed)
I spoke with pt daughter, [name], & she would like Dr. Acie Fredrickson to know she is in 4N rm 21 States she was told pt was in CHF & has an extreme headache.  She would like Dr. Acie Fredrickson to see her mom  Horton Chin RN

## 2013-10-16 ENCOUNTER — Telehealth: Payer: Self-pay | Admitting: Cardiovascular Disease

## 2013-10-16 MED ORDER — ATENOLOL 100 MG PO TABS: 50.0000 mg | ORAL_TABLET | Freq: Every day | ORAL | Status: DC

## 2013-10-16 MED ORDER — FUROSEMIDE 40 MG PO TABS: 40.0000 mg | ORAL_TABLET | ORAL | Status: DC | PRN

## 2013-10-16 MED ORDER — POTASSIUM CHLORIDE CRYS ER 20 MEQ PO TBCR: 20.0000 meq | EXTENDED_RELEASE_TABLET | ORAL | Status: DC | PRN

## 2013-10-16 MED ORDER — LISINOPRIL 20 MG PO TABS: 20.0000 mg | ORAL_TABLET | Freq: Every evening | ORAL | Status: DC

## 2013-10-16 MED ORDER — ONDANSETRON HCL 4 MG/2ML IJ SOLN: 4.0000 mg | Freq: Four times a day (QID) | INTRAMUSCULAR | Status: DC | PRN

## 2013-10-16 NOTE — Telephone Encounter (Signed)
New message         Pt son says pt is extremely confused / pt has been recently diagnosed with CHF / Pt is requesting a call back from nurse

## 2013-10-16 NOTE — Progress Notes (Signed)
CM CONSULT Patient has CHF, HTN, atrial fib and would benefit from a Disease Management Program; talked to patient and she is agreeable to [facility] services, pt chose [facility]; Rhett Bannister RN with [facility] called; Attending MD please order at discharge HHRN/ Disease Management Program; Aneta Mins [phone]

## 2013-10-16 NOTE — Progress Notes (Signed)
Pt c/o numbness and tingling around her mouth. Pt states that this numbness and tingling happened earlier this admission. MD notified and at bedside. Stroke workup already completed, and since this numbness and tingling happened earlier this admission, MD will not repeat any tests at this time. Pt aware and comfortable with being discharged at this time.

## 2013-10-16 NOTE — Discharge Summary (Signed)
Physician Discharge Summary  Brittany [name] XUX:[id number] DOB: April 18, 1927 DOA: 10/11/2013  PCP: Mayra Neer, MD  Admit date: 10/11/2013 Discharge date: 10/16/2013  Time spent: >30 minutes  Recommendations for Outpatient Follow-up:  Follow-up Information   Follow up with Darden Amber., MD. (We submitted your information to our schedulers for a followup appointment. Office will call you for your followup appointment. Call office if you have not heard back in 3 days.)    Specialty:  Cardiology   Contact information:   [address] [place] [address] [phone]       Follow up with Mayra Neer, MD. (IN 1WEEK, call for appt upon discharge)    Specialty:  Family Medicine   Contact information:   [place] [address] [place] [address] [phone]        Discharge Diagnoses:  Principal Problem:   Acute on chronic diastolic congestive heart failure Active Problems:   Hypertension   Atrial fibrillation   S/P AVR (aortic valve replacement)   Dizziness   Headache   Headache(784.0)   Atelectasis   Discharge Condition: Improved/stable  Diet recommendation: Low sodium heart healthy  Filed Weights   10/14/13 0605 10/15/13 0656 10/16/13 0500  Weight: 67.7 kg (149 lb 4 oz) 69.1 kg (152 lb 5.4 oz) 65.5 kg (144 lb 6.4 oz)    History of present illness:  Pt is a 80 y.o. female, with past medical history significant for hypertension and previous CVA diagnosed 2 years ago with right peripheral vision loss, presenting today with a few days history of headache and elevated blood pressure with dizziness. The patient started having a headache and stuffy nose few days ago and she received Sudafed and Afrin for a possible sinusitis . Her blood pressure was elevated and she came to the emergency room yesterday and was sent home after she received Dilaudid and Benadryl, and her blood pressure was controlled to come back today with the same symptoms .CT of  the head was negative Her headache was frontal ,dull, nonradiating associated with nausea but no vomiting and no loss of consciousness. She was admitted for further evaluation and management.   Hospital Course:  80 year old white female presented on 7/3 with complaints of headache and markedly elevated blood pressures. Initially thought to be stroke which was ruled out. Found to be in acute diastolic heart failure (new diagnosis for patient) and with Lasix, blood pressures improved significantly and headache resolved. As she has been fully diuresed, blood pressures have been slightly soft-systolic 90, headache has returned. Persists despite treatment trying Tylenol, narcotics or NSAIDs. ESR unremarkable. Suspect is secondary to lower blood pressure antihypertensives including Lasix were stopped and her blood pressure and the hypotension resolved. 1.  Hypertension, Malignant. Likely secondary to volume overload. Improved with diuresis. Subsequently patient developed borderline hypotension and all her oral antihypertensives were held as well as Lasix. On followup today blood pressures in the 130s to 150 range of meds, she is to resume her atenolol  at decreased dose 50 mg, and the lisinopril decreased to 20 mg daily. As discussed below her cardiology recommendations Lasix to be taken only when necessary weight gain of 3 pounds or greater. She is to followup with her PCP for close monitoring of her blood pressures and further adjustment of her medications as clinically appropriate for optimal blood pressure control. 2. Headache(784.0): initially felt to be secondary to malignant hypertension.however with headache persisting. Could be from borderline hypotension now that we have diuresed her.  Blood pressure in the 130s to 150s, still with slight headache but better. Has stopped her other blood pressure medications and Lasix were stopped when her blood pressures would borderline as discussed above but now remaining  stable to elevated and her BP meds resumed as above. ESR was checked and within normal limits at 18, likely not temporal arteritis. Patient is to followup with her PCP. MRI /MRA this hospital stay did not show any acute findings. 3. Atrial fibrillation: Stable on Xarelto. Rate controlled. Continue atenolol at 50 mg on discharge as above 4. S/P AVR (aortic valve replacement): Follow up outpatient 5. Acute on Chronic diastolic heart failure: Grade 2 diastolic dysfunction noted on echocardiogram. New finding in diagnosis for patient. BNP elevated and started IV Lasix last night. Patient has diuresed 3 L. Blood pressure in turn has responded well. Providing education for patient. She was seen by Dr. Bensimohn>> assisted with diuresis. Heart dry weight 65.5 kg on discharge today. She is clinically improved and asymptomatic at this time. Cardiology recommended Lasix 40 mg when necessary only weight gain of 3 pounds or more 6. Atelectasis: Patient complained of some chronic episodes of sharp stabbing pain underneath her left breast, most notably when she leans forward. Rounding hospitalist was unable to find a focal spot, but stated does not appear to be a hernia. Chest extremity and she does have atelectasis there. She is reportedly more sedentary. Patient improved with incentive spirometry in the hospital.  Procedures:  2-D echo 7/4  Study Conclusions  - Left ventricle: The cavity size was normal. Wall thickness was increased in a pattern of moderate LVH. Systolic function was normal. The estimated ejection fraction was in the range of 60% to 65%. Features are consistent with a pseudonormal left ventricular filling pattern, with concomitant abnormal relaxation and increased filling pressure (grade 2 diastolic dysfunction). - Aortic valve: An [employer] pericardial tissue valve model 2700, size 21 mm is in the aortic valve position. There was no stenosis. There was no significant  regurgitation. Valve area (VTI): 1.93 cm^2. Valve area (Vmax): 1.57 cm^2. - Mitral valve: Moderately to severely calcified annulus. Mildly thickened leaflets . There was mild regurgitation. - Left atrium: The atrium was severely dilated. - Right ventricle: Not well visualized. Grossly it appears mild to moderately enlarged with likely normal function. - Right atrium: The atrium was severely dilated. - Pulmonary arteries: Systolic pressure was moderately increased. PA peak pressure: 41 mm Hg (S). - Inferior vena cava: The vessel was dilated. The respirophasic diameter changes were blunted (< 50%), consistent with elevated central venous pressure. - Technically difficult study.   Consultations:  Cardiology  Neurology  Discharge Exam: Filed Vitals:   10/16/13 1359  BP: 132/98  Pulse: 104  Temp: 98.3 F (36.8 C)  Resp: 18    Exam:  General: Alert x3, in no apparent distress Cardiovascular: Regular rate and rhythm, S1 S2, 2/6 systolic ejection murmur  Respiratory: Clear to auscultation bilaterally.  Abdomen: Soft, nontender, nondistended, positive bowel sounds.  Musculoskeletal: No clubbing or cyanosis, no edema   Discharge Instructions You were cared for by a hospitalist during your hospital stay. If you have any questions about your discharge medications or the care you received while you were in the hospital after you are discharged, you can call the unit and asked to speak with the hospitalist on call if the hospitalist that took care of you is not available. Once you are discharged, your primary care physician will handle any further medical  issues. Please note that NO REFILLS for any discharge medications will be authorized once you are discharged, as it is imperative that you return to your primary care physician (or establish a relationship with a primary care physician if you do not have one) for your aftercare needs so that they can reassess your need for medications and  monitor your lab values.  Discharge Instructions   Diet - low sodium heart healthy    Complete by:  As directed      Increase activity slowly    Complete by:  As directed             Medication List  TAKE these medications         atenolol 100 MG tablet take half a tablet (50 mg )daily                atorvastatin 20 MG tablet  Commonly known as:  LIPITOR  Take 20 mg by mouth daily.     butalbital-acetaminophen-caffeine 50-325-40 MG per tablet  Commonly known as:  FIORICET  Take 1 tablet by mouth every 6 (six) hours as needed for headache.     cholecalciferol 1000 UNITS tablet  Commonly known as:  VITAMIN D  Take 1,000 Units by mouth daily.     FISH OIL PO  Take 1 g by mouth daily.     furosemide 40 MG tablet  Commonly known as:  LASIX  Take 1 tablet (40 mg total) by mouth as needed (weight gain of 3lbs or more).     lisinopril 20 MG tablet  Commonly known as:  PRINIVIL,ZESTRIL  Take 20 mg by mouth every evening.     multivitamin with minerals tablet  Take 1 tablet by mouth daily.     omeprazole 20 MG capsule  Commonly known as:  PRILOSEC  Take 20 mg by mouth 2 (two) times daily before a meal.     ondansetron 4 MG/2ML Soln injection  Commonly known as:  ZOFRAN  Inject 2 mLs (4 mg total) into the vein every 6 (six) hours as needed for nausea or vomiting.     oxybutynin 5 MG tablet  Commonly known as:  DITROPAN  Take 5 mg by mouth 3 (three) times daily.     pantoprazole 40 MG tablet  Commonly known as:  PROTONIX  Take 40 mg by mouth daily.     potassium chloride SA 20 MEQ tablet  Commonly known as:  K-DUR,KLOR-CON  Take 1 tablet (20 mEq total) by mouth as needed (with lasix as directed).     pseudoephedrine 60 MG tablet  Commonly known as:  SUDAFED  Take 1 tablet (60 mg total) by mouth every 6 (six) hours as needed for congestion.     Rivaroxaban 15 MG Tabs tablet  Commonly known as:  XARELTO  Take 15 mg by mouth daily.       Allergies   Allergen Reactions  . Amoxicillin Diarrhea and Nausea And Vomiting  . Cephalexin Diarrhea  . Codeine Nausea Only  . Morphine And Related Other (See Comments)    HX ADDICTION / WITHDRAWAL  . Neurontin [Gabapentin] Swelling    Legs swell  . Nitrofurantoin Monohyd Macro Diarrhea and Nausea Only  . Prednisone Nausea Only  . Sulfa Antibiotics Rash       Follow-up Information   Follow up with Darden Amber., MD. (We submitted your information to our schedulers for a followup appointment. Office will call you for your followup appointment. Call  office if you have not heard back in 3 days.)    Specialty:  Cardiology   Contact information:   [place] [address] [phone]        The results of significant diagnostics from this hospitalization (including imaging, microbiology, ancillary and laboratory) are listed below for reference.    Significant Diagnostic Studies: Dg Chest 2 View  10/12/2013   CLINICAL DATA:  Stroke.  EXAM: CHEST  2 VIEW  COMPARISON:  08/08/2012.  Abdomen CT dated 02/03/2012.  FINDINGS: Stable enlarged cardiac silhouette, median sternotomy wires and prosthetic heart valve. Curvilinear mitral valve annulus calcification on the left. Mildly prominent pulmonary vasculature and interstitial markings. The lungs are mildly hyperexpanded and mild diffuse peribronchial thickening is noted. Interval small amount of patchy density at the left lateral lung base. Diffuse osteopenia.  IMPRESSION: 1. Small amount of patchy atelectasis or pneumonia at the left lateral lung base. 2. Cardiomegaly and changes of COPD and chronic bronchitis.   Electronically Signed   By: Enrique Sack M.D.   On: 10/12/2013 08:53   Ct Head Wo Contrast  10/10/2013   CLINICAL DATA:  Headache, hypertension  EXAM: CT HEAD WITHOUT CONTRAST  TECHNIQUE: Contiguous axial images were obtained from the base of the skull through the vertex without intravenous contrast.  COMPARISON:  MRI  07/23/2011  FINDINGS: Mild atrophy. Moderate chronic microvascular ischemic change throughout the white matter. Chronic left medial occipital infarct.  Negative for acute infarct, hemorrhage, or mass. Atherosclerotic disease is present. No focal bony abnormality.  IMPRESSION: Chronic ischemic change.  No acute abnormality.   Electronically Signed   By: Franchot Gallo M.D.   On: 10/10/2013 09:36   Mr Brain Wo Contrast  10/12/2013   CLINICAL DATA:  Stroke.  EXAM: MRI HEAD WITHOUT CONTRAST  MRA HEAD WITHOUT CONTRAST  TECHNIQUE: Multiplanar, multiecho pulse sequences of the brain and surrounding structures were obtained without intravenous contrast. Angiographic images of the head were obtained using MRA technique without contrast.  COMPARISON:  Head CT 10/10/2013.  MRI 07/23/2011.  FINDINGS: MRI HEAD FINDINGS  Diffusion imaging does not show any acute or subacute infarction. There are mild chronic small-vessel changes affecting the pons. No focal cerebellar insult. The cerebral hemispheres show an old cortical infarction in the left occipital lobe which has progressed to atrophy and encephalomalacia. There are confluent chronic small vessel ischemic changes throughout the cerebral hemispheric white matter. No mass lesion, acute hemorrhage, hydrocephalus or extra-axial collection. Minimal hemosiderin is residual in the region of the left occipital infarction and in a few of the areas of chronic white matter infarction. No pituitary mass. No inflammatory sinus disease. No skull or skullbase lesion.  MRA HEAD FINDINGS  Both internal carotid arteries are widely patent into the brain. The anterior and middle cerebral vessels are normal without proximal stenosis, aneurysm or vascular malformation.  Both vertebral arteries are widely patent to the basilar. No basilar stenosis. Posterior circulation branch vessels are patent, but there are few root distal branch is seen in the left PCA than the right.  IMPRESSION: No acute  infarction. Old left occipital infarction. Advanced chronic small vessel change of the cerebral hemispheric white matter.  No major vessel occlusion or correctable proximal stenosis. Few missing distal PCA branches on the left.   Electronically Signed   By: Nelson Chimes M.D.   On: 10/12/2013 09:18   Mr [name] Head/brain Wo Cm  10/12/2013   CLINICAL DATA:  Stroke.  EXAM: MRI HEAD  WITHOUT CONTRAST  MRA HEAD WITHOUT CONTRAST  TECHNIQUE: Multiplanar, multiecho pulse sequences of the brain and surrounding structures were obtained without intravenous contrast. Angiographic images of the head were obtained using MRA technique without contrast.  COMPARISON:  Head CT 10/10/2013.  MRI 07/23/2011.  FINDINGS: MRI HEAD FINDINGS  Diffusion imaging does not show any acute or subacute infarction. There are mild chronic small-vessel changes affecting the pons. No focal cerebellar insult. The cerebral hemispheres show an old cortical infarction in the left occipital lobe which has progressed to atrophy and encephalomalacia. There are confluent chronic small vessel ischemic changes throughout the cerebral hemispheric white matter. No mass lesion, acute hemorrhage, hydrocephalus or extra-axial collection. Minimal hemosiderin is residual in the region of the left occipital infarction and in a few of the areas of chronic white matter infarction. No pituitary mass. No inflammatory sinus disease. No skull or skullbase lesion.  MRA HEAD FINDINGS  Both internal carotid arteries are widely patent into the brain. The anterior and middle cerebral vessels are normal without proximal stenosis, aneurysm or vascular malformation.  Both vertebral arteries are widely patent to the basilar. No basilar stenosis. Posterior circulation branch vessels are patent, but there are few root distal branch is seen in the left PCA than the right.  IMPRESSION: No acute infarction. Old left occipital infarction. Advanced chronic small vessel change of the cerebral  hemispheric white matter.  No major vessel occlusion or correctable proximal stenosis. Few missing distal PCA branches on the left.   Electronically Signed   By: Nelson Chimes M.D.   On: 10/12/2013 09:18    Microbiology: Recent Results (from the past 240 hour(s))  URINE CULTURE     Status: None   Collection Time    10/13/13 11:40 PM      Result Value Ref Range Status   Specimen Description URINE, CLEAN CATCH   Final   Special Requests NONE   Final   Culture  Setup Time     Final   Value: 10/14/2013 04:51     Performed at [facility]     Final   Value: 6,000 COLONIES/ML     Performed at [facility]   Culture     Final   Value: INSIGNIFICANT GROWTH     Performed at [facility]   Report Status 10/15/2013 FINAL   Final     Labs: Basic Metabolic Panel:  Recent Labs Lab 10/10/13 0849 10/13/13 0453 10/14/13 0633  NA 136* 141 139  K 3.9 3.4* 4.0  CL 98 99 99  CO2 $Re'25 28 29  'YOv$ GLUCOSE 97 93 95  BUN $Re'8 11 17  'Dic$ CREATININE 0.55 0.68 0.80  CALCIUM 9.1 9.2 8.7   Liver Function Tests:  Recent Labs Lab 10/10/13 0849  AST 29  ALT 20  ALKPHOS 90  BILITOT 0.5  PROT 6.9  ALBUMIN 3.3*   No results found for this basename: LIPASE, AMYLASE,  in the last 168 hours No results found for this basename: AMMONIA,  in the last 168 hours CBC:  Recent Labs Lab 10/10/13 0849 10/14/13 0633  WBC 10.5 8.3  NEUTROABS 7.1 4.4  HGB 12.2 12.7  HCT 37.3 39.9  MCV 89.0 90.1  PLT 234 253   Cardiac Enzymes: No results found for this basename: CKTOTAL, CKMB, CKMBINDEX, TROPONINI,  in the last 168 hours BNP: BNP (last 3 results)  Recent Labs  10/12/13 1723 10/13/13 0453 10/14/13 0923  PROBNP 2955.0* 1863.0* 1455.0*  CBG: No results found for this basename: GLUCAP,  in the last 168 hours     Signed:  Samyia Motter C  [facility] 10/16/2013, 2:09 PM

## 2013-10-16 NOTE — Progress Notes (Signed)
Discharge orders received. Pt for discharge home today. IV d/c'd. Pt given discharge instructions and prescriptions with verbalized understanding. Family in room to assist with discharge. Staff brought pt downstairs via wheelchair.   

## 2013-10-16 NOTE — Telephone Encounter (Signed)
The pts son, Konrad Dolores, states that the pt is very confused since being released from the hospital yesterday and he thinks it is due to her CHF.  Konrad Dolores is advised per Dr Mare Ferrari (DOD) that confusion is not related to CHF and that he should contact the pts PCP. He verbalized understanding.

## 2013-10-17 ENCOUNTER — Emergency Department (HOSPITAL_COMMUNITY): Payer: Medicare Other

## 2013-10-17 ENCOUNTER — Encounter (HOSPITAL_COMMUNITY): Payer: Self-pay | Admitting: Emergency Medicine

## 2013-10-17 ENCOUNTER — Inpatient Hospital Stay (HOSPITAL_COMMUNITY)
Admission: EM | Admit: 2013-10-17 | Discharge: 2013-10-21 | DRG: 247 | Disposition: A | Discharge status: Home / Self Care | Payer: Medicare Other | Attending: Cardiovascular Disease | Admitting: Cardiovascular Disease

## 2013-10-17 DIAGNOSIS — T46905A Adverse effect of unspecified agents primarily affecting the cardiovascular system, initial encounter: Secondary | ICD-10-CM | POA: Diagnosis not present

## 2013-10-17 DIAGNOSIS — R209 Unspecified disturbances of skin sensation: Secondary | ICD-10-CM | POA: Diagnosis not present

## 2013-10-17 DIAGNOSIS — K219 Gastro-esophageal reflux disease without esophagitis: Secondary | ICD-10-CM | POA: Diagnosis present

## 2013-10-17 DIAGNOSIS — R05 Cough: Secondary | ICD-10-CM

## 2013-10-17 DIAGNOSIS — Z87891 Personal history of nicotine dependence: Secondary | ICD-10-CM | POA: Diagnosis not present

## 2013-10-17 DIAGNOSIS — I5032 Chronic diastolic (congestive) heart failure: Secondary | ICD-10-CM | POA: Diagnosis not present

## 2013-10-17 DIAGNOSIS — I482 Chronic atrial fibrillation, unspecified: Secondary | ICD-10-CM

## 2013-10-17 DIAGNOSIS — Z7901 Long term (current) use of anticoagulants: Secondary | ICD-10-CM

## 2013-10-17 DIAGNOSIS — Z955 Presence of coronary angioplasty implant and graft: Secondary | ICD-10-CM

## 2013-10-17 DIAGNOSIS — I2510 Atherosclerotic heart disease of native coronary artery without angina pectoris: Secondary | ICD-10-CM | POA: Diagnosis not present

## 2013-10-17 DIAGNOSIS — Z79899 Other long term (current) drug therapy: Secondary | ICD-10-CM

## 2013-10-17 DIAGNOSIS — I200 Unstable angina: Secondary | ICD-10-CM | POA: Diagnosis not present

## 2013-10-17 DIAGNOSIS — E785 Hyperlipidemia, unspecified: Secondary | ICD-10-CM | POA: Diagnosis present

## 2013-10-17 DIAGNOSIS — I10 Essential (primary) hypertension: Secondary | ICD-10-CM | POA: Diagnosis present

## 2013-10-17 DIAGNOSIS — R51 Headache: Secondary | ICD-10-CM

## 2013-10-17 DIAGNOSIS — R059 Cough, unspecified: Secondary | ICD-10-CM | POA: Diagnosis not present

## 2013-10-17 DIAGNOSIS — I214 Non-ST elevation (NSTEMI) myocardial infarction: Secondary | ICD-10-CM | POA: Diagnosis not present

## 2013-10-17 DIAGNOSIS — I4891 Unspecified atrial fibrillation: Secondary | ICD-10-CM | POA: Diagnosis present

## 2013-10-17 DIAGNOSIS — I249 Acute ischemic heart disease, unspecified: Secondary | ICD-10-CM

## 2013-10-17 DIAGNOSIS — R079 Chest pain, unspecified: Secondary | ICD-10-CM | POA: Diagnosis not present

## 2013-10-17 DIAGNOSIS — Z952 Presence of prosthetic heart valve: Secondary | ICD-10-CM

## 2013-10-17 DIAGNOSIS — Z8673 Personal history of transient ischemic attack (TIA), and cerebral infarction without residual deficits: Secondary | ICD-10-CM

## 2013-10-17 DIAGNOSIS — R519 Headache, unspecified: Secondary | ICD-10-CM | POA: Diagnosis present

## 2013-10-17 DIAGNOSIS — R200 Anesthesia of skin: Secondary | ICD-10-CM | POA: Diagnosis present

## 2013-10-17 DIAGNOSIS — I350 Nonrheumatic aortic (valve) stenosis: Secondary | ICD-10-CM | POA: Diagnosis present

## 2013-10-17 DIAGNOSIS — T464X5A Adverse effect of angiotensin-converting-enzyme inhibitors, initial encounter: Secondary | ICD-10-CM

## 2013-10-17 LAB — CBC
HEMATOCRIT: 38.5 % (ref 36.0–46.0)
HEMOGLOBIN: 12.8 g/dL (ref 12.0–15.0)
MCH: 29.2 pg (ref 26.0–34.0)
MCHC: 33.2 g/dL (ref 30.0–36.0)
MCV: 87.9 fL (ref 78.0–100.0)
Platelets: 261 10*3/uL (ref 150–400)
RBC: 4.38 MIL/uL (ref 3.87–5.11)
RDW: 13.1 % (ref 11.5–15.5)
WBC: 9.4 10*3/uL (ref 4.0–10.5)

## 2013-10-17 LAB — I-STAT TROPONIN, ED: Troponin i, poc: 0.33 ng/mL (ref 0.00–0.08)

## 2013-10-17 NOTE — ED Notes (Signed)
Per EMS, pt was discharged from the hospital yesterday for hypertension. Tonight pt started having chest pain and SOB. Upon EMS arrival, pt's BP was 220/120 and was in a-fib with RVR. Pt received 324mg  ASA and 2 NTG. Pt also initially received 12mg  of cardizem which brought the pt's heart rate down to  90 and her pain down to 1/10. En route, pt started to experience chest pain and tachycardia again and received an additional 10mg  of cardizem, which lowered the pt's heart rate to 100. Pt's heart rate at this time is 90.

## 2013-10-18 ENCOUNTER — Encounter (HOSPITAL_COMMUNITY)
Admission: EM | Disposition: A | Discharge status: Home / Self Care | Payer: Self-pay | Source: Home / Self Care | Attending: Cardiovascular Disease

## 2013-10-18 DIAGNOSIS — I10 Essential (primary) hypertension: Secondary | ICD-10-CM | POA: Diagnosis not present

## 2013-10-18 DIAGNOSIS — I214 Non-ST elevation (NSTEMI) myocardial infarction: Secondary | ICD-10-CM | POA: Diagnosis not present

## 2013-10-18 DIAGNOSIS — I200 Unstable angina: Secondary | ICD-10-CM | POA: Diagnosis not present

## 2013-10-18 DIAGNOSIS — I2510 Atherosclerotic heart disease of native coronary artery without angina pectoris: Secondary | ICD-10-CM | POA: Diagnosis not present

## 2013-10-18 DIAGNOSIS — I4891 Unspecified atrial fibrillation: Secondary | ICD-10-CM | POA: Diagnosis not present

## 2013-10-18 DIAGNOSIS — I5032 Chronic diastolic (congestive) heart failure: Secondary | ICD-10-CM | POA: Diagnosis not present

## 2013-10-18 HISTORY — PX: LEFT HEART CATHETERIZATION WITH CORONARY ANGIOGRAM: SHX5451

## 2013-10-18 LAB — APTT: APTT: 30 s (ref 24–37)

## 2013-10-18 LAB — CBC
HEMATOCRIT: 35.1 % — AB (ref 36.0–46.0)
Hemoglobin: 11.4 g/dL — ABNORMAL LOW (ref 12.0–15.0)
MCH: 28.6 pg (ref 26.0–34.0)
MCHC: 32.5 g/dL (ref 30.0–36.0)
MCV: 88 fL (ref 78.0–100.0)
Platelets: 245 10*3/uL (ref 150–400)
RBC: 3.99 MIL/uL (ref 3.87–5.11)
RDW: 13.3 % (ref 11.5–15.5)
WBC: 10.8 10*3/uL — ABNORMAL HIGH (ref 4.0–10.5)

## 2013-10-18 LAB — BASIC METABOLIC PANEL
ANION GAP: 15 (ref 5–15)
Anion gap: 17 — ABNORMAL HIGH (ref 5–15)
BUN: 17 mg/dL (ref 6–23)
BUN: 19 mg/dL (ref 6–23)
CALCIUM: 9.1 mg/dL (ref 8.4–10.5)
CALCIUM: 9.2 mg/dL (ref 8.4–10.5)
CHLORIDE: 99 meq/L (ref 96–112)
CO2: 24 mEq/L (ref 19–32)
CO2: 24 meq/L (ref 19–32)
Chloride: 95 mEq/L — ABNORMAL LOW (ref 96–112)
Creatinine, Ser: 0.71 mg/dL (ref 0.50–1.10)
Creatinine, Ser: 0.85 mg/dL (ref 0.50–1.10)
GFR calc Af Amer: 71 mL/min — ABNORMAL LOW (ref >90)
GFR calc non Af Amer: 77 mL/min — ABNORMAL LOW (ref >90)
GFR, EST AFRICAN AMERICAN: 89 mL/min — AB (ref >90)
GFR, EST NON AFRICAN AMERICAN: 61 mL/min — AB (ref >90)
GLUCOSE: 122 mg/dL — AB (ref 70–99)
Glucose, Bld: 106 mg/dL — ABNORMAL HIGH (ref 70–99)
Potassium: 4.3 mEq/L (ref 3.7–5.3)
Potassium: 4.3 mEq/L (ref 3.7–5.3)
SODIUM: 138 meq/L (ref 137–147)
Sodium: 136 mEq/L — ABNORMAL LOW (ref 137–147)

## 2013-10-18 LAB — TROPONIN I
Troponin I: 0.57 ng/mL (ref <0.30)
Troponin I: 0.63 ng/mL (ref <0.30)
Troponin I: <0.3 ng/mL (ref <0.30)

## 2013-10-18 LAB — POCT ACTIVATED CLOTTING TIME
ACTIVATED CLOTTING TIME: 281 s
Activated Clotting Time: 253 seconds

## 2013-10-18 LAB — PRO B NATRIURETIC PEPTIDE: Pro B Natriuretic peptide (BNP): 1426 pg/mL — ABNORMAL HIGH (ref 0–450)

## 2013-10-18 LAB — HEPARIN LEVEL (UNFRACTIONATED): Heparin Unfractionated: 0.26 IU/mL — ABNORMAL LOW (ref 0.30–0.70)

## 2013-10-18 LAB — PROTIME-INR
INR: 1.23 (ref 0.00–1.49)
Prothrombin Time: 15.5 seconds — ABNORMAL HIGH (ref 11.6–15.2)

## 2013-10-18 LAB — MRSA PCR SCREENING: MRSA BY PCR: NEGATIVE

## 2013-10-18 SURGERY — LEFT HEART CATHETERIZATION WITH CORONARY ANGIOGRAM
Anesthesia: LOCAL

## 2013-10-18 MED ORDER — FAMOTIDINE IN NACL 20-0.9 MG/50ML-% IV SOLN
INTRAVENOUS | Status: AC
  Filled 2013-10-18: qty 50

## 2013-10-18 MED ORDER — ATENOLOL 100 MG PO TABS
100.0000 mg | ORAL_TABLET | Freq: Every day | ORAL | Status: DC
  Administered 2013-10-19: 100 mg via ORAL
  Filled 2013-10-18 (×2): qty 1

## 2013-10-18 MED ORDER — FUROSEMIDE 40 MG PO TABS: 40.0000 mg | ORAL_TABLET | ORAL | Status: DC | PRN

## 2013-10-18 MED ORDER — BUTALBITAL-APAP-CAFFEINE 50-325-40 MG PO TABS
1.0000 | ORAL_TABLET | Freq: Four times a day (QID) | ORAL | Status: DC | PRN
Start: 2013-10-18 — End: 2013-10-21
  Administered 2013-10-19 – 2013-10-20 (×2): 1 via ORAL
  Filled 2013-10-18 (×2): qty 1

## 2013-10-18 MED ORDER — HYDRALAZINE HCL 20 MG/ML IJ SOLN
INTRAMUSCULAR | Status: AC
  Filled 2013-10-18: qty 1

## 2013-10-18 MED ORDER — ACETAMINOPHEN 325 MG PO TABS
650.0000 mg | ORAL_TABLET | ORAL | Status: DC | PRN
Start: 2013-10-18 — End: 2013-10-18

## 2013-10-18 MED ORDER — HEPARIN (PORCINE) IN NACL 100-0.45 UNIT/ML-% IJ SOLN
850.0000 [IU]/h | INTRAMUSCULAR | Status: DC
  Filled 2013-10-18: qty 250

## 2013-10-18 MED ORDER — NITROGLYCERIN 2 % TD OINT
1.0000 [in_us] | TOPICAL_OINTMENT | Freq: Once | TRANSDERMAL | Status: AC
  Administered 2013-10-18: 1 [in_us] via TOPICAL
  Filled 2013-10-18: qty 1

## 2013-10-18 MED ORDER — MIDAZOLAM HCL 2 MG/2ML IJ SOLN
INTRAMUSCULAR | Status: AC
  Filled 2013-10-18: qty 2

## 2013-10-18 MED ORDER — WARFARIN - PHARMACIST DOSING INPATIENT
Freq: Every day | Status: DC
  Administered 2013-10-20: 17:00:00

## 2013-10-18 MED ORDER — CLOPIDOGREL BISULFATE 75 MG PO TABS
75.0000 mg | ORAL_TABLET | Freq: Every day | ORAL | Status: DC
  Administered 2013-10-19 – 2013-10-21 (×3): 75 mg via ORAL
  Filled 2013-10-18 (×4): qty 1

## 2013-10-18 MED ORDER — ONDANSETRON HCL 4 MG/2ML IJ SOLN: 4.0000 mg | Freq: Four times a day (QID) | INTRAMUSCULAR | Status: DC | PRN

## 2013-10-18 MED ORDER — HEPARIN (PORCINE) IN NACL 2-0.9 UNIT/ML-% IJ SOLN
INTRAMUSCULAR | Status: AC
  Filled 2013-10-18: qty 1000

## 2013-10-18 MED ORDER — ASPIRIN EC 81 MG PO TBEC: 81.0000 mg | DELAYED_RELEASE_TABLET | Freq: Every day | ORAL | Status: DC

## 2013-10-18 MED ORDER — NITROGLYCERIN 0.2 MG/ML ON CALL CATH LAB
INTRAVENOUS | Status: AC
  Filled 2013-10-18: qty 1

## 2013-10-18 MED ORDER — FENTANYL CITRATE 0.05 MG/ML IJ SOLN
INTRAMUSCULAR | Status: AC
  Filled 2013-10-18: qty 2

## 2013-10-18 MED ORDER — LIDOCAINE HCL (PF) 1 % IJ SOLN
INTRAMUSCULAR | Status: AC
  Filled 2013-10-18: qty 30

## 2013-10-18 MED ORDER — HYDROMORPHONE HCL PF 1 MG/ML IJ SOLN
INTRAMUSCULAR | Status: AC
  Administered 2013-10-18: 1 mg
  Filled 2013-10-18: qty 1

## 2013-10-18 MED ORDER — ATORVASTATIN CALCIUM 20 MG PO TABS
20.0000 mg | ORAL_TABLET | Freq: Every day | ORAL | Status: DC
  Administered 2013-10-20 – 2013-10-21 (×2): 20 mg via ORAL
  Filled 2013-10-18 (×4): qty 1

## 2013-10-18 MED ORDER — VITAMIN D3 25 MCG (1000 UNIT) PO TABS
1000.0000 [IU] | ORAL_TABLET | Freq: Every day | ORAL | Status: DC
  Administered 2013-10-19 – 2013-10-21 (×3): 1000 [IU] via ORAL
  Filled 2013-10-18 (×4): qty 1

## 2013-10-18 MED ORDER — ONDANSETRON HCL 4 MG/2ML IJ SOLN
4.0000 mg | Freq: Four times a day (QID) | INTRAMUSCULAR | Status: DC | PRN
  Administered 2013-10-20: 4 mg via INTRAVENOUS
  Filled 2013-10-18: qty 2

## 2013-10-18 MED ORDER — FENTANYL CITRATE 0.05 MG/ML IJ SOLN: 50.0000 ug | Freq: Once | INTRAMUSCULAR | Status: DC

## 2013-10-18 MED ORDER — PANTOPRAZOLE SODIUM 40 MG PO TBEC
40.0000 mg | DELAYED_RELEASE_TABLET | Freq: Every day | ORAL | Status: DC
  Administered 2013-10-19 – 2013-10-21 (×3): 40 mg via ORAL
  Filled 2013-10-18 (×3): qty 1

## 2013-10-18 MED ORDER — HYDROMORPHONE HCL PF 1 MG/ML IJ SOLN: 1.0000 mg | Freq: Once | INTRAMUSCULAR | Status: DC

## 2013-10-18 MED ORDER — ENOXAPARIN SODIUM 40 MG/0.4ML ~~LOC~~ SOLN: 40.0000 mg | Freq: Every day | SUBCUTANEOUS | Status: DC

## 2013-10-18 MED ORDER — METOPROLOL TARTRATE 1 MG/ML IV SOLN
INTRAVENOUS | Status: AC
  Filled 2013-10-18: qty 5

## 2013-10-18 MED ORDER — ACETAMINOPHEN 325 MG PO TABS
650.0000 mg | ORAL_TABLET | ORAL | Status: DC | PRN
  Administered 2013-10-19: 650 mg via ORAL
  Filled 2013-10-18: qty 2

## 2013-10-18 MED ORDER — HYDROMORPHONE HCL PF 1 MG/ML IJ SOLN
1.0000 mg | INTRAMUSCULAR | Status: DC | PRN
  Administered 2013-10-18: 1 mg via INTRAVENOUS
  Filled 2013-10-18: qty 1

## 2013-10-18 MED ORDER — CLOPIDOGREL BISULFATE 300 MG PO TABS
ORAL_TABLET | ORAL | Status: AC
  Filled 2013-10-18: qty 1

## 2013-10-18 MED ORDER — LISINOPRIL 20 MG PO TABS
20.0000 mg | ORAL_TABLET | Freq: Two times a day (BID) | ORAL | Status: DC
  Administered 2013-10-18 – 2013-10-19 (×3): 20 mg via ORAL
  Filled 2013-10-18 (×7): qty 1

## 2013-10-18 MED ORDER — OXYBUTYNIN CHLORIDE 5 MG PO TABS
5.0000 mg | ORAL_TABLET | Freq: Three times a day (TID) | ORAL | Status: DC
  Administered 2013-10-18 – 2013-10-21 (×10): 5 mg via ORAL
  Filled 2013-10-18 (×14): qty 1

## 2013-10-18 MED ORDER — NITROGLYCERIN IN D5W 200-5 MCG/ML-% IV SOLN
20.0000 ug/min | INTRAVENOUS | Status: DC
  Administered 2013-10-18: 20 ug/min via INTRAVENOUS
  Filled 2013-10-18: qty 250

## 2013-10-18 MED ORDER — HEPARIN SODIUM (PORCINE) 1000 UNIT/ML IJ SOLN
INTRAMUSCULAR | Status: AC
  Filled 2013-10-18: qty 1

## 2013-10-18 MED ORDER — FENTANYL CITRATE 0.05 MG/ML IJ SOLN
25.0000 ug | Freq: Once | INTRAMUSCULAR | Status: AC
  Administered 2013-10-18: 25 ug via INTRAVENOUS
  Filled 2013-10-18: qty 2

## 2013-10-18 MED ORDER — ASPIRIN 81 MG PO CHEW
81.0000 mg | CHEWABLE_TABLET | Freq: Every day | ORAL | Status: DC
  Administered 2013-10-18 – 2013-10-21 (×4): 81 mg via ORAL
  Filled 2013-10-18 (×4): qty 1

## 2013-10-18 MED ORDER — VERAPAMIL HCL 2.5 MG/ML IV SOLN
INTRAVENOUS | Status: AC
  Filled 2013-10-18: qty 2

## 2013-10-18 MED ORDER — SODIUM CHLORIDE 0.9 % IV SOLN: 1.0000 mL/kg/h | INTRAVENOUS | Status: AC

## 2013-10-18 MED ORDER — ONDANSETRON HCL 4 MG/2ML IJ SOLN
INTRAMUSCULAR | Status: AC
  Filled 2013-10-18: qty 2

## 2013-10-18 MED ORDER — WARFARIN SODIUM 5 MG PO TABS
5.0000 mg | ORAL_TABLET | Freq: Once | ORAL | Status: AC
  Administered 2013-10-18: 5 mg via ORAL
  Filled 2013-10-18: qty 1

## 2013-10-18 MED ORDER — CLOPIDOGREL BISULFATE 300 MG PO TABS
ORAL_TABLET | ORAL | Status: AC
Start: 2013-10-18 — End: 2013-10-18
  Filled 2013-10-18: qty 1

## 2013-10-18 MED ORDER — ONDANSETRON HCL 4 MG/2ML IJ SOLN
4.0000 mg | Freq: Once | INTRAMUSCULAR | Status: AC
  Administered 2013-10-18: 4 mg via INTRAVENOUS
  Filled 2013-10-18: qty 2

## 2013-10-18 MED ORDER — POTASSIUM CHLORIDE CRYS ER 20 MEQ PO TBCR: 20.0000 meq | EXTENDED_RELEASE_TABLET | ORAL | Status: DC | PRN

## 2013-10-18 NOTE — ED Provider Notes (Signed)
CSN: [id number]     Arrival date & time 10/17/13  2301 History   First MD Initiated Contact with Patient 10/17/13 2349     Chief Complaint  Patient presents with  . Chest Pain  . Hypertension  . Atrial Fibrillation     (Consider location/radiation/quality/duration/timing/severity/associated sxs/prior Treatment) HPI This is a pleasant elderly woman with a history of hypertension, atrial fibrillation, aortic valve replacement. She presents with several hours of centrally located chest pain and epigastric pain. Her symptoms were initially associated with shortness of breath. However, shortness of breath resolved. Paramedics were called to her house and she was found to have a blood pressure of 210/110 at the scene. She received aspirin and 2 sublingual nitroglycerin on route. The patient was also tachycardic in atrial fibrillation and received 50 mg of IV diltiazem on route.  The patient says she is feeling better. However, her discomfort still rates it 4 on a 0-to-10 scale. She describes it as aching, cramping and pressure-like. She denies history of similar symptoms. She denies cough and fever. No nausea, vomiting or diarrhea.  The patient says she has no history of coronary artery disease and believes that she had a normal cardiac stress test about 3 years ago. She is anticoagulated with relative.   Past Medical History  Diagnosis Date  . Dyslipidemia     takes Lipitor daily  . Arthritis   . Urine incontinence   . Diverticulosis   . IC (interstitial cystitis)   . S/P aortic valve replacement     2005  . H/O hiatal hernia   . History of TIA (transient ischemic attack)     2013-- RESIDUAL PERIPHERAL VISION RIGHT EYE--  RESOLVED  . History of bacterial endocarditis   . Foot drop     SINCE 1987  . History of gastritis   . Chronic back pain   . Hypertension   . Atrial fibrillation   . GERD (gastroesophageal reflux disease)    Past Surgical History  Procedure Laterality Date  .  Abdominal hysterectomy  1967  . Aortic valve replacement  09-29-2003     [facility] pericardial tissue valve  . Cholecystectomy  1993  . Vein ligation  1969    RIGHT LOWER LEG  . Dilation and curettage of uterus    . Ercp N/A 08/10/2012    Procedure: ENDOSCOPIC RETROGRADE CHOLANGIOPANCREATOGRAPHY (ERCP);  Surgeon: Inda Castle, MD;  Location: [place];  Service: Gastroenterology;  Laterality: N/A;  . Cataract extraction w/ intraocular lens  implant, bilateral    . Appendectomy  1933  . Cardiac catheterization  07-15-2003 DR HELEN PRESTON    MODERATE TO MODERATELY SEVERE CALCIFIC AORTIC STENOSIS/  NORMAL CORONARY ARTERIES  . Right shoulder arthroscopy w/ debridement rotator cuff and labral tear/ acrominoplasty/ distal clavicle excision/ ca ligament release  10-08-1999  . Cysto/ hydrodistention/ instillation clorpactin  MULTIPLE  last one 2009  . Mini-open right rotator cuff repair  07-12-2001  . Transvaginal tape procedure  07-30-2002  . Lumbar disc surgery  1985 &  2007  . Macroplastique urethral implantation  08-27-2009  . Transthoracic echocardiogram  08-10-2011    MILD LVH/  EF 55-60%/  NORMAL AVR TISSUE/ MILD MR/  MODERATE DILATED LA/  MILD DILATED RA  . Cardiovascular stress test  01-30-2012  DR NASHER    NORMAL NUCLEAR STUDY/  EF 73%/  NORMAL LVF  . Tonsillectomy and adenoidectomy    . Carpal tunnel release Bilateral   . Cysto with hydrodistension N/A  02/05/2013    Procedure: CYSTOSCOPY/HYDRODISTENSION;  Surgeon: Irine Seal, MD;  Location: [facility];  Service: Urology;  Laterality: N/A;   Family History  Problem Relation Age of Onset  . Heart disease Father   . Coronary artery disease Brother   . Bipolar disorder Brother     commited suiside at 56yo  . Alcohol abuse Sister     sister #1  . Alcohol abuse Sister     sister #2  . Colon cancer Maternal Aunt   . Stomach cancer Maternal Grandmother    History  Substance Use Topics  . Smoking  status: Former Smoker    Types: Cigarettes    Quit date: 03/06/1990  . Smokeless tobacco: Never Used  . Alcohol Use: No   OB History   Grav Para Term Preterm Abortions TAB SAB Ect Mult Living                 Review of Systems Ten point review of symptoms performed and is negative with the exception of symptoms noted above.    Allergies  Amoxicillin; Cephalexin; Codeine; Morphine and related; Neurontin; Nitrofurantoin monohyd macro; Prednisone; and Sulfa antibiotics  Home Medications   Prior to Admission medications   Medication Sig Start Date End Date Taking? Authorizing Provider  atenolol (TENORMIN) 100 MG tablet Take 100 mg by mouth daily.   Yes Historical Provider, MD  atorvastatin (LIPITOR) 20 MG tablet Take 20 mg by mouth daily.    Yes Historical Provider, MD  cholecalciferol (VITAMIN D) 1000 UNITS tablet Take 1,000 Units by mouth daily.   Yes Historical Provider, MD  lisinopril (PRINIVIL,ZESTRIL) 20 MG tablet Take 20 mg by mouth 2 (two) times daily.   Yes Historical Provider, MD  Multiple Vitamins-Minerals (MULTIVITAMIN WITH MINERALS) tablet Take 1 tablet by mouth daily.   Yes Historical Provider, MD  Omega-3 Fatty Acids (FISH OIL PO) Take 1 g by mouth daily.    Yes Historical Provider, MD  omeprazole (PRILOSEC) 20 MG capsule Take 20 mg by mouth 2 (two) times daily before a meal.   Yes Historical Provider, MD  oxybutynin (DITROPAN) 5 MG tablet Take 5 mg by mouth 3 (three) times daily.   Yes Historical Provider, MD  pantoprazole (PROTONIX) 40 MG tablet Take 40 mg by mouth daily.   Yes Historical Provider, MD  Rivaroxaban (XARELTO) 15 MG TABS tablet Take 15 mg by mouth daily.   Yes Historical Provider, MD  butalbital-acetaminophen-caffeine (FIORICET) 50-325-40 MG per tablet Take 1 tablet by mouth every 6 (six) hours as needed for headache. 10/10/13 10/10/14  Clayton Bibles, PA-C  furosemide (LASIX) 40 MG tablet Take 1 tablet (40 mg total) by mouth as needed (weight gain of 3lbs or more).  10/16/13   Sheila Oats, MD  potassium chloride SA (K-DUR,KLOR-CON) 20 MEQ tablet Take 1 tablet (20 mEq total) by mouth as needed (with lasix as directed). 10/16/13   Sheila Oats, MD   BP 110/66  Pulse 76  Temp(Src) 97.6 F (36.4 C) (Oral)  Resp 17  Ht 5\' 1"  (1.549 m)  Wt 154 lb (69.854 kg)  BMI 29.11 kg/m2  SpO2 96% Physical Exam Gen: well developed and well nourished appearing Head: NCAT Eyes: PERL, EOMI Nose: no epistaixis or rhinorrhea Mouth/throat: mucosa is moist and pink Neck: supple, no stridor Lungs: CTA B, no wheezing, rhonchi or rales CV: iireg, irreg no murmur, extremities appear well perfused.  Abd: soft, notender, nondistended Back: no ttp, no cva ttp Skin: warm and  dry Ext: normal to inspection, no dependent edema Neuro: CN ii-xii grossly intact, no focal deficits Psyche; normal affect,  calm and cooperative.   ED Course  Procedures (including critical care time) Labs Review  Results for orders placed during the hospital encounter of 10/17/13 (from the past 24 hour(s))  CBC     Status: None   Collection Time    10/17/13 11:31 PM      Result Value Ref Range   WBC 9.4  4.0 - 10.5 K/uL   RBC 4.38  3.87 - 5.11 MIL/uL   Hemoglobin 12.8  12.0 - 15.0 g/dL   HCT 38.5  36.0 - 46.0 %   MCV 87.9  78.0 - 100.0 fL   MCH 29.2  26.0 - 34.0 pg   MCHC 33.2  30.0 - 36.0 g/dL   RDW 13.1  11.5 - 15.5 %   Platelets 261  150 - 400 K/uL  BASIC METABOLIC PANEL     Status: Abnormal   Collection Time    10/17/13 11:31 PM      Result Value Ref Range   Sodium 136 (*) 137 - 147 mEq/L   Potassium 4.3  3.7 - 5.3 mEq/L   Chloride 95 (*) 96 - 112 mEq/L   CO2 24  19 - 32 mEq/L   Glucose, Bld 122 (*) 70 - 99 mg/dL   BUN 19  6 - 23 mg/dL   Creatinine, Ser 0.85  0.50 - 1.10 mg/dL   Calcium 9.2  8.4 - 10.5 mg/dL   GFR calc non Af Amer 61 (*) >90 mL/min   GFR calc Af Amer 71 (*) >90 mL/min   Anion gap 17 (*) 5 - 15  PRO B NATRIURETIC PEPTIDE     Status: Abnormal    Collection Time    10/17/13 11:31 PM      Result Value Ref Range   Pro B Natriuretic peptide (BNP) 1426.0 (*) 0 - 450 pg/mL  I-STAT TROPOININ, ED     Status: Abnormal   Collection Time    10/17/13 11:37 PM      Result Value Ref Range   Troponin i, poc 0.33 (*) 0.00 - 0.08 ng/mL   Comment NOTIFIED PHYSICIAN     Comment 3             Imaging Review Dg Chest Port 1 View  10/18/2013   CLINICAL DATA:  Central chest pain since earlier today.  EXAM: PORTABLE CHEST - 1 VIEW  COMPARISON:  10/12/2013  FINDINGS: Postoperative changes in the mediastinum. Shallow inspiration. Borderline heart size is probably normal for degree of inspiration. No focal airspace disease or consolidation in the lungs. No blunting of costophrenic angles. Calcified and tortuous aorta.  IMPRESSION: No active disease.   Electronically Signed   By: Lucienne Capers M.D.   On: 10/18/2013 00:11    EKG: nsr, no acute ischemic changes, normal intervals, normal axis, normal qrs complex, mild ST depression in lateral leads  MDM   DDX: ACS, pneumonia, pancreatitis, gastritis, gallbladder disease, pericarditis, pneumonia.   First troponin is elevated. Pt has had ASA. She is anticoagulated with Xarelto. We are treating with Fentanyl 27mcg and NTG 1" paste to chest of residual chest pain. Case discussed with Hal Hope to discuss admission. He asks that we consult Cardiology. I believe Cards is at bedside and we will await recommendations.     Elyn Peers, MD 10/18/13 [phone]

## 2013-10-18 NOTE — ED Notes (Signed)
Nitro paste removed.

## 2013-10-18 NOTE — ED Notes (Signed)
Cheri Guppy, MD at bedside.

## 2013-10-18 NOTE — Interval H&P Note (Signed)
Cath Lab Visit (complete for each Cath Lab visit)  Clinical Evaluation Leading to the Procedure:   ACS: Yes.    Non-ACS:    Anginal Classification: CCS IV  Anti-ischemic medical therapy: Maximal Therapy (2 or more classes of medications)  Non-Invasive Test Results: No non-invasive testing performed  Prior CABG: No previous CABG      History and Physical Interval Note:  10/18/2013 10:08 AM  Brittany [name]  has presented today for surgery, with the diagnosis of cp  The various methods of treatment have been discussed with the patient and family. After consideration of risks, benefits and other options for treatment, the patient has consented to  Procedure(s): LEFT HEART CATHETERIZATION WITH CORONARY ANGIOGRAM (N/A) as a surgical intervention .  The patient's history has been reviewed, patient examined, no change in status, stable for surgery.  I have reviewed the patient's chart and labs.  Questions were answered to the patient's satisfaction.     Dallin Mccorkel S.

## 2013-10-18 NOTE — ED Notes (Signed)
Cheri Guppy, MD notified of pt's elevated troponin.

## 2013-10-18 NOTE — Progress Notes (Addendum)
ANTICOAGULATION CONSULT NOTE - Follow Up Consult  Pharmacy Consult for Coumadin Indication: atrial fibrillation  Allergies  Allergen Reactions  . Amoxicillin Diarrhea and Nausea And Vomiting  . Cephalexin Diarrhea  . Codeine Nausea Only  . Morphine And Related Other (See Comments)    HX ADDICTION / WITHDRAWAL  . Neurontin [Gabapentin] Swelling    Legs swell  . Nitrofurantoin Monohyd Macro Diarrhea and Nausea Only  . Prednisone Nausea Only  . Sulfa Antibiotics Rash    Patient Measurements: Height: 5\' 1"  (154.9 cm) Weight: 144 lb 12.8 oz (65.681 kg) IBW/kg (Calculated) : 47.8  Vital Signs: Temp: 97.9 F (36.6 C) (07/10 0427) Temp src: Oral (07/10 0427) BP: 110/71 mmHg (07/10 0427) Pulse Rate: 66 (07/10 1004)  Labs:  Recent Labs  10/17/13 2331 10/18/13 0125 10/18/13 0454 10/18/13 0545 10/18/13 1434  HGB 12.8  --   --  11.4*  --   HCT 38.5  --   --  35.1*  --   PLT 261  --   --  245  --   APTT  --   --   --  30  --   HEPARINUNFRC  --   --   --  0.26*  --   CREATININE 0.85  --   --  0.71  --   TROPONINI  --  0.57* 0.63*  --  <0.30    Estimated Creatinine Clearance: 45.5 ml/min (by C-G formula based on Cr of 0.71).  Assessment:  s/p PCI, DES to RCA.  Plavix 600 mg loading dose given in cath lab and 75 mg daily to continue.  Changing from Xarelto 15 mg daily to Coumadin. Also on Aspirin 81 mg daily. Last Xarelto dose estimated between 2-3:30pm on 7/9.  PT/INR may be difficult to interpret while Xarelto effects diminish.  Will add baseline PT/INR now.  (Just outside 8hr window to add to blood in lab from aPTT this am.)  Goal of Therapy:  INR 2-3 Monitor platelets by anticoagulation protocol: Yes   Plan:   PT/INR this afternoon.  Coumadin 5 mg x 1 tonight.  Daily PT/INR.   Arty Baumgartner, [place] Pager: [phone] 10/18/2013,3:36 PM

## 2013-10-18 NOTE — Progress Notes (Signed)
PT complained of continuing Substernal chest pain at 7/10. Nitroglycerin titrated to 30 mcgs. Dr. Recardo Evangelist updated with continuing problem. Requesting for IV pain med .

## 2013-10-18 NOTE — H&P (Signed)
Cardiology Consultation Note  Patient ID: Brittany [name], MRN: [id number], DOB/AGE: [id number] 78 y.o. Admit date: 10/17/2013   Date of Consult: 10/18/2013 Primary Physician: Mayra Neer, MD Primary Cardiologist: D NAsher   Chief Complaint: Chest pain  Reason for Consult: NSTEMI    Assessment and Plan:  NSTEMI  Permanant Afib  Hx of AVR  Diastolic heart failure - chronic   Plan  -Hold Xarelto in anticipation of likely cath. Last dose was 10/17/2013 at 2 pm  - cont Aspirin , statin and atenolol and nitro gtt  -Monitor on tele  -Trend CE     80 year old Caucasian female with past medical history significant for hypertension, hyperlipidemia, history of CVA permanent atrial fibrillation on Xarelto for the last 2 years, and history of aortic valve replacement with bioprosthetic valve in 2005, here with chest pain   HPI: pt was recently admitted to the hospital for management of malignant HTN , diastolic HF and headache. She was seen by Dr Haroldine Laws and d/c on 10/16/2013. She returns within 24 hrs for symptoms of chest pain . Pt states that earlier in the afternoon while she was trying to sleep post lunch she started experiencing soreness , heaviness ' someone sitting on my chest ' on the left side radiating to left arm with associated mild SOB. This improved with SL NTG , however she continues to have some chest pain. Pt currently denies any orthopnea, PND , LE edema ,  syncope,  claudication , palpitation etc .  Reports medication compliance. She bruises easily in her skin and once episodes of epistaxis several months ago.    Past Medical History  Diagnosis Date  . Dyslipidemia     takes Lipitor daily  . Arthritis   . Urine incontinence   . Diverticulosis   . IC (interstitial cystitis)   . S/P aortic valve replacement     2005  . H/O hiatal hernia   . History of TIA (transient ischemic attack)     2013-- RESIDUAL PERIPHERAL VISION RIGHT EYE--  RESOLVED  . History of  bacterial endocarditis   . Foot drop     SINCE 1987  . History of gastritis   . Chronic back pain   . Hypertension   . Atrial fibrillation   . GERD (gastroesophageal reflux disease)       Most Recent Cardiac Studies: Per Dr Cathie Olden notes, myoview in 01/2012 was normal    EKG 10/17/2013 ; Afib with old Anterior MI and lateral lead 0.5 to 1 mm ST depression with TWI suggestive of ischemia   Echo 07/04/ 2015 Left ventricle: The cavity size was normal. Wall thickness was increased in a pattern of moderate LVH. Systolic function was normal. The estimated ejection fraction was in the range of 60% to 65%. Features are consistent with a pseudonormal left ventricular filling pattern, with concomitant abnormal relaxation and increased filling pressure (grade 2 diastolic dysfunction). - Aortic valve: An [employer] pericardial tissue valve model 2700, size 21 mm is in the aortic valve position. There was no stenosis. There was no significant regurgitation. Valve area (VTI): 1.93 cm^2. Valve area (Vmax): 1.57 cm^2. - Mitral valve: Moderately to severely calcified annulus. Mildly thickened leaflets . There was mild regurgitation. - Left atrium: The atrium was severely dilated. - Right ventricle: Not well visualized. Grossly it appears mild to moderately enlarged with likely normal function. - Right atrium: The atrium was severely dilated. - Pulmonary arteries: Systolic pressure was moderately increased. PA peak pressure: 41  mm Hg (S). - Inferior vena cava: The vessel was dilated. The respirophasic diameter changes were blunted (< 50%), consistent with elevated central venous pressure.     Surgical History:  Past Surgical History  Procedure Laterality Date  . Abdominal hysterectomy  1967  . Aortic valve replacement  09-29-2003     [facility] pericardial tissue valve  . Cholecystectomy  1993  . Vein ligation  1969    RIGHT LOWER LEG  . Dilation and curettage of  uterus    . Ercp N/A 08/10/2012    Procedure: ENDOSCOPIC RETROGRADE CHOLANGIOPANCREATOGRAPHY (ERCP);  Surgeon: Inda Castle, MD;  Location: [facility];  Service: Gastroenterology;  Laterality: N/A;  . Cataract extraction w/ intraocular lens  implant, bilateral    . Appendectomy  1933  . Cardiac catheterization  07-15-2003 DR HELEN PRESTON    MODERATE TO MODERATELY SEVERE CALCIFIC AORTIC STENOSIS/  NORMAL CORONARY ARTERIES  . Right shoulder arthroscopy w/ debridement rotator cuff and labral tear/ acrominoplasty/ distal clavicle excision/ ca ligament release  10-08-1999  . Cysto/ hydrodistention/ instillation clorpactin  MULTIPLE  last one 2009  . Mini-open right rotator cuff repair  07-12-2001  . Transvaginal tape procedure  07-30-2002  . Lumbar disc surgery  1985 &  2007  . Macroplastique urethral implantation  08-27-2009  . Transthoracic echocardiogram  08-10-2011    MILD LVH/  EF 55-60%/  NORMAL AVR TISSUE/ MILD MR/  MODERATE DILATED LA/  MILD DILATED RA  . Cardiovascular stress test  01-30-2012  DR NASHER    NORMAL NUCLEAR STUDY/  EF 73%/  NORMAL LVF  . Tonsillectomy and adenoidectomy    . Carpal tunnel release Bilateral   . Cysto with hydrodistension N/A 02/05/2013    Procedure: CYSTOSCOPY/HYDRODISTENSION;  Surgeon: Irine Seal, MD;  Location: [facility];  Service: Urology;  Laterality: N/A;     Home Meds: Prior to Admission medications   Medication Sig Start Date End Date Taking? Authorizing Provider  atenolol (TENORMIN) 100 MG tablet Take 100 mg by mouth daily.   Yes Historical Provider, MD  atorvastatin (LIPITOR) 20 MG tablet Take 20 mg by mouth daily.    Yes Historical Provider, MD  cholecalciferol (VITAMIN D) 1000 UNITS tablet Take 1,000 Units by mouth daily.   Yes Historical Provider, MD  lisinopril (PRINIVIL,ZESTRIL) 20 MG tablet Take 20 mg by mouth 2 (two) times daily.   Yes Historical Provider, MD  Multiple Vitamins-Minerals (MULTIVITAMIN WITH MINERALS) tablet  Take 1 tablet by mouth daily.   Yes Historical Provider, MD  Omega-3 Fatty Acids (FISH OIL PO) Take 1 g by mouth daily.    Yes Historical Provider, MD  omeprazole (PRILOSEC) 20 MG capsule Take 20 mg by mouth 2 (two) times daily before a meal.   Yes Historical Provider, MD  oxybutynin (DITROPAN) 5 MG tablet Take 5 mg by mouth 3 (three) times daily.   Yes Historical Provider, MD  pantoprazole (PROTONIX) 40 MG tablet Take 40 mg by mouth daily.   Yes Historical Provider, MD  Rivaroxaban (XARELTO) 15 MG TABS tablet Take 15 mg by mouth daily.   Yes Historical Provider, MD  butalbital-acetaminophen-caffeine (FIORICET) 50-325-40 MG per tablet Take 1 tablet by mouth every 6 (six) hours as needed for headache. 10/10/13 10/10/14  Clayton Bibles, PA-C  furosemide (LASIX) 40 MG tablet Take 1 tablet (40 mg total) by mouth as needed (weight gain of 3lbs or more). 10/16/13   Sheila Oats, MD  potassium chloride SA (K-DUR,KLOR-CON) 20 MEQ tablet  Take 1 tablet (20 mEq total) by mouth as needed (with lasix as directed). 10/16/13   Sheila Oats, MD    Inpatient Medications:  . fentaNYL  50 mcg Intravenous Once   . nitroGLYCERIN 20 mcg/min (10/18/13 0313)    Allergies:  Allergies  Allergen Reactions  . Amoxicillin Diarrhea and Nausea And Vomiting  . Cephalexin Diarrhea  . Codeine Nausea Only  . Morphine And Related Other (See Comments)    HX ADDICTION / WITHDRAWAL  . Neurontin [Gabapentin] Swelling    Legs swell  . Nitrofurantoin Monohyd Macro Diarrhea and Nausea Only  . Prednisone Nausea Only  . Sulfa Antibiotics Rash    History   Social History  . Marital Status: Widowed    Spouse Name: N/A    Number of Children: 4  . Years of Education: N/A   Occupational History  . retired    Social History Main Topics  . Smoking status: Former Smoker    Types: Cigarettes    Quit date: 03/06/1990  . Smokeless tobacco: Never Used  . Alcohol Use: No  . Drug Use: No  . Sexual Activity: Not on file   Other  Topics Concern  . Not on file   Social History Narrative  . No narrative on file     Family History  Problem Relation Age of Onset  . Heart disease Father   . Coronary artery disease Brother   . Bipolar disorder Brother     commited suiside at 86yo  . Alcohol abuse Sister     sister #1  . Alcohol abuse Sister     sister #2  . Colon cancer Maternal Aunt   . Stomach cancer Maternal Grandmother      Review of Systems General: negative for chills, fever, night sweats or weight changes.  Cardiovascular: per HPI  Dermatological: negative for rash Respiratory: negative for cough or wheezing Urologic: negative for hematuria Abdominal: + nausea, vomiting, diarrhea, bright red blood per rectum, melena, or hematemesis Neurologic: negative for visual changes, syncope, or dizziness All other systems reviewed and are otherwise negative except as noted above.  Labs:  Recent Labs  10/18/13 0125  TROPONINI 0.57*   Lab Results  Component Value Date   WBC 9.4 10/17/2013   HGB 12.8 10/17/2013   HCT 38.5 10/17/2013   MCV 87.9 10/17/2013   PLT 261 10/17/2013    Recent Labs Lab 10/17/13 2331  NA 136*  K 4.3  CL 95*  CO2 24  BUN 19  CREATININE 0.85  CALCIUM 9.2  GLUCOSE 122*   Lab Results  Component Value Date   CHOL 159 10/12/2013   HDL 62 10/12/2013   LDLCALC 86 10/12/2013   TRIG 53 10/12/2013   No results found for this basename: DDIMER    Radiology/Studies:  Dg Chest 2 View  10/12/2013   CLINICAL DATA:  Stroke.  EXAM: CHEST  2 VIEW  COMPARISON:  08/08/2012.  Abdomen CT dated 02/03/2012.  FINDINGS: Stable enlarged cardiac silhouette, median sternotomy wires and prosthetic heart valve. Curvilinear mitral valve annulus calcification on the left. Mildly prominent pulmonary vasculature and interstitial markings. The lungs are mildly hyperexpanded and mild diffuse peribronchial thickening is noted. Interval small amount of patchy density at the left lateral lung base. Diffuse osteopenia.   IMPRESSION: 1. Small amount of patchy atelectasis or pneumonia at the left lateral lung base. 2. Cardiomegaly and changes of COPD and chronic bronchitis.   Electronically Signed   By: Georg Ruddle.D.  On: 10/12/2013 08:53   Ct Head Wo Contrast  10/10/2013   CLINICAL DATA:  Headache, hypertension  EXAM: CT HEAD WITHOUT CONTRAST  TECHNIQUE: Contiguous axial images were obtained from the base of the skull through the vertex without intravenous contrast.  COMPARISON:  MRI 07/23/2011  FINDINGS: Mild atrophy. Moderate chronic microvascular ischemic change throughout the white matter. Chronic left medial occipital infarct.  Negative for acute infarct, hemorrhage, or mass. Atherosclerotic disease is present. No focal bony abnormality.  IMPRESSION: Chronic ischemic change.  No acute abnormality.   Electronically Signed   By: Franchot Gallo M.D.   On: 10/10/2013 09:36   Mr Brain Wo Contrast  10/12/2013   CLINICAL DATA:  Stroke.  EXAM: MRI HEAD WITHOUT CONTRAST  MRA HEAD WITHOUT CONTRAST  TECHNIQUE: Multiplanar, multiecho pulse sequences of the brain and surrounding structures were obtained without intravenous contrast. Angiographic images of the head were obtained using MRA technique without contrast.  COMPARISON:  Head CT 10/10/2013.  MRI 07/23/2011.  FINDINGS: MRI HEAD FINDINGS  Diffusion imaging does not show any acute or subacute infarction. There are mild chronic small-vessel changes affecting the pons. No focal cerebellar insult. The cerebral hemispheres show an old cortical infarction in the left occipital lobe which has progressed to atrophy and encephalomalacia. There are confluent chronic small vessel ischemic changes throughout the cerebral hemispheric white matter. No mass lesion, acute hemorrhage, hydrocephalus or extra-axial collection. Minimal hemosiderin is residual in the region of the left occipital infarction and in a few of the areas of chronic white matter infarction. No pituitary mass. No  inflammatory sinus disease. No skull or skullbase lesion.  MRA HEAD FINDINGS  Both internal carotid arteries are widely patent into the brain. The anterior and middle cerebral vessels are normal without proximal stenosis, aneurysm or vascular malformation.  Both vertebral arteries are widely patent to the basilar. No basilar stenosis. Posterior circulation branch vessels are patent, but there are few root distal branch is seen in the left PCA than the right.  IMPRESSION: No acute infarction. Old left occipital infarction. Advanced chronic small vessel change of the cerebral hemispheric white matter.  No major vessel occlusion or correctable proximal stenosis. Few missing distal PCA branches on the left.   Electronically Signed   By: Nelson Chimes M.D.   On: 10/12/2013 09:18   Dg Chest Port 1 View  10/18/2013   CLINICAL DATA:  Central chest pain since earlier today.  EXAM: PORTABLE CHEST - 1 VIEW  COMPARISON:  10/12/2013  FINDINGS: Postoperative changes in the mediastinum. Shallow inspiration. Borderline heart size is probably normal for degree of inspiration. No focal airspace disease or consolidation in the lungs. No blunting of costophrenic angles. Calcified and tortuous aorta.  IMPRESSION: No active disease.   Electronically Signed   By: Lucienne Capers M.D.   On: 10/18/2013 00:11   Mr [name] Head/brain Wo Cm  10/12/2013   CLINICAL DATA:  Stroke.  EXAM: MRI HEAD WITHOUT CONTRAST  MRA HEAD WITHOUT CONTRAST  TECHNIQUE: Multiplanar, multiecho pulse sequences of the brain and surrounding structures were obtained without intravenous contrast. Angiographic images of the head were obtained using MRA technique without contrast.  COMPARISON:  Head CT 10/10/2013.  MRI 07/23/2011.  FINDINGS: MRI HEAD FINDINGS  Diffusion imaging does not show any acute or subacute infarction. There are mild chronic small-vessel changes affecting the pons. No focal cerebellar insult. The cerebral hemispheres show an old cortical infarction  in the left occipital lobe which has progressed to atrophy and encephalomalacia.  There are confluent chronic small vessel ischemic changes throughout the cerebral hemispheric white matter. No mass lesion, acute hemorrhage, hydrocephalus or extra-axial collection. Minimal hemosiderin is residual in the region of the left occipital infarction and in a few of the areas of chronic white matter infarction. No pituitary mass. No inflammatory sinus disease. No skull or skullbase lesion.  MRA HEAD FINDINGS  Both internal carotid arteries are widely patent into the brain. The anterior and middle cerebral vessels are normal without proximal stenosis, aneurysm or vascular malformation.  Both vertebral arteries are widely patent to the basilar. No basilar stenosis. Posterior circulation branch vessels are patent, but there are few root distal branch is seen in the left PCA than the right.  IMPRESSION: No acute infarction. Old left occipital infarction. Advanced chronic small vessel change of the cerebral hemispheric white matter.  No major vessel occlusion or correctable proximal stenosis. Few missing distal PCA branches on the left.   Electronically Signed   By: Nelson Chimes M.D.   On: 10/12/2013 09:18    Physical Exam: Blood pressure 126/55, pulse 92, temperature 97.6 F (36.4 C), temperature source Oral, resp. rate 24, height 5\' 1"  (1.549 m), weight 69.854 kg (154 lb), SpO2 97.00%. General: thin body habitus, NAD .  Neck: Negative for carotid bruits. JVD not elevated. Lungs: Clear bilaterally to auscultation without wheezes, rales, or rhonchi. Breathing is unlabored. Heart: irregular , 2/6 murmur  Abdomen: Soft, non-tender, non-distended with normoactive bowel sounds. No hepatomegaly. No rebound/guarding. No obvious abdominal masses. Extremities: No clubbing or cyanosis. No edema.  Distal pedal pulses are 2+ and equal bilaterally. Neuro: Alert and oriented X 3. No facial asymmetry. No focal deficit. Moves all  extremities spontaneously. Psych:  Responds to questions appropriately with a normal affect.    Cory Roughen, A M.D  10/18/2013, 3:15 AM

## 2013-10-18 NOTE — Progress Notes (Signed)
ANTICOAGULATION CONSULT NOTE - Initial Consult  Pharmacy Consult for Heparin  Indication: NSTEMI  Allergies  Allergen Reactions  . Amoxicillin Diarrhea and Nausea And Vomiting  . Cephalexin Diarrhea  . Codeine Nausea Only  . Morphine And Related Other (See Comments)    HX ADDICTION / WITHDRAWAL  . Neurontin [Gabapentin] Swelling    Legs swell  . Nitrofurantoin Monohyd Macro Diarrhea and Nausea Only  . Prednisone Nausea Only  . Sulfa Antibiotics Rash   Patient Measurements: Height: 5\' 1"  (154.9 cm) Weight: 154 lb (69.854 kg) IBW/kg (Calculated) : 47.8 Heparin Dosing Weight: ~63 kg  Vital Signs: Temp: 97.6 F (36.4 C) (07/09 2315) Temp src: Oral (07/09 2315) BP: 106/62 mmHg (07/10 0355) Pulse Rate: 93 (07/10 0355)  Labs:  Recent Labs  10/17/13 2331 10/18/13 0125  HGB 12.8  --   HCT 38.5  --   PLT 261  --   CREATININE 0.85  --   TROPONINI  --  0.57*   Estimated Creatinine Clearance: 44 ml/min (by C-G formula based on Cr of 0.85).  Medical History: Past Medical History  Diagnosis Date  . Dyslipidemia     takes Lipitor daily  . Arthritis   . Urine incontinence   . Diverticulosis   . IC (interstitial cystitis)   . S/P aortic valve replacement     2005  . H/O hiatal hernia   . History of TIA (transient ischemic attack)     2013-- RESIDUAL PERIPHERAL VISION RIGHT EYE--  RESOLVED  . History of bacterial endocarditis   . Foot drop     SINCE 1987  . History of gastritis   . Chronic back pain   . Hypertension   . Atrial fibrillation   . GERD (gastroesophageal reflux disease)     Medications:  Xarelto PTA, last dose 7/9 at 1400   Assessment: 80 y/o F with NSTEMI to start heparin, Xarelto PTA, CBC ok, renal function ok, other labs as above. Will get baseline heparin level and aPTT and use aPTT to guide dosing until correlation occurs between the labs.   Goal of Therapy:  Heparin level 0.3-0.7 units/ml aPTT 66-102 seconds Monitor platelets by  anticoagulation protocol: Yes   Plan:  -Get baseline heparin level and aPTT -Start heparin infusion at 850 units/hr at 1400 today -2200 aPTT/HL -Daily aPTT/HL -Monitor for bleeding  Narda Bonds 10/18/2013,4:06 AM

## 2013-10-18 NOTE — ED Notes (Signed)
Forest Gleason, MD at bedside.

## 2013-10-18 NOTE — CV Procedure (Signed)
PROCEDURE:  Left heart catheterization with selective coronary angiography, left ventriculogram.  PCI ostial RCA, right upper extremity angiogram  INDICATIONS:  NSTEMI  The risks, benefits, and details of the procedure were explained to the patient.  The patient verbalized understanding and wanted to proceed.  Informed written consent was obtained.  PROCEDURE TECHNIQUE:  After Xylocaine anesthesia a 9F slender sheath was placed in the right radial artery with a single anterior needle wall stick.   IV heparin was given. Right coronary angiography was done using a Judkins R4 guide catheter.  Left coronary angiography was done using a Judkins L3.5 guide catheter.  Left ventriculography was done using a pigtail catheter. The intervention was performed. A TR band was used for hemostasis.   CONTRAST:  Total of 150 cc.  COMPLICATIONS:  None.    HEMODYNAMICS:  Aortic pressure was 173/109;The aortic valve was not crossed.    ANGIOGRAPHIC DATA:   The left main coronary artery is widely.  The left anterior descending artery is a large vessel which wraps around the apex. In the mid LAD, there is moderate disease. There is a small diagonal which is patent. The second diagonal is moderate in size with a 50% ostial stenosis. The mid to distal LAD is widely patent.  The left circumflex artery is a large, codominant vessel. There is a large first obtuse marginal which is patent. The second obtuse marginal is patent with moderate disease proximally. There is a medium size third obtuse marginal which is also patent.  The right coronary artery is a large, codominant vessel. There is an ostial, 90% stenosis with significant pressure damping with vessel engagement. There is an early bifurcation of the posterior lateral and posterior descending artery. Both branches appear patent.  LEFT VENTRICULOGRAM:  Left ventricular angiogram was not done.  PCI NARRATIVE: A JR 4 guide with sideholes was placed. IV  heparin was given. An ACT was used to check that the heparin was therapeutic. Several doses of nitroglycerin were given intracoronary during the case. A pro-water wire was placed across the area disease in the RCA. A 2.5 x 12 balloon was used to predilate the ostium of the right coronary artery. A 2.5 x 14 resolute drug-eluting stent was deployed across the ostium. The stent balloon was used to flare the ostium. A 3.0 x 12 noncompliant balloon was used to post dilate the stented segment. There still appeared to be a waist at the ostium. A 3.25 x 8 noncompliant balloon was then used to post dilate as well. There is still mild waist at the ostium. The best view to visualize this was 52 LAO. TIMI-3 flow was maintained throughout. The initial pressure damping that was present resolved after the stent placement. The patient did have significant discomfort with balloon inflations.  IMPRESSIONS:  1. Normal left main coronary artery. 2. Moderate disease in the mid left anterior descending artery. 3. Mild to moderate disease in the left circumflex artery and its branches. 4. Severe, fibrotic disease in the ostial right coronary artery which was the culprit for today's presentation. This was successfully stented with a 2.5 x 14 resolute drug-eluting stent, postdilated to 3.3 mm in diameter. 5. Left ventricular systolic function not assessed due to aortic valve replacement.Marland Kitchen    RECOMMENDATION:  Continue dual antiplatelet therapy while she is in the hospital. I spoke with Dr. Acie Fredrickson.  We'll plan on sending her home on warfarin and Plavix.  Check P2 Y. 12 platelet testing in the  morning. Continue aggressive secondary prevention. Her LAD disease did not appear significant angiographically. Followup with Dr. Acie Fredrickson.

## 2013-10-18 NOTE — Progress Notes (Signed)
Patient Name: Brittany [name] Date of Encounter: 10/18/2013  Active Problems:   NSTEMI (non-ST elevated myocardial infarction)   Length of Stay: 1  SUBJECTIVE  Ongoing mid-chest "soreness" at rest despite escalating doses of IV NTG. Troponin I 0.66. Mild repol changes, most obvious in I and aVL. Chronic AF, controlled rate, on beta blocker. S/P bioprosthetic AVR 2005 (cors normal at that time, normal nuclear study 2013). Anticoagulated with Xarelto 15 mg daily, last dose 1530h on 07/09.  CURRENT MEDS . [START ON 10/19/2013] aspirin EC  81 mg Oral Daily  . atenolol  100 mg Oral Daily  . atorvastatin  20 mg Oral Daily  . cholecalciferol  1,000 Units Oral Daily  . fentaNYL  50 mcg Intravenous Once  . lisinopril  20 mg Oral BID  . oxybutynin  5 mg Oral TID  . pantoprazole  40 mg Oral Daily    OBJECTIVE   Intake/Output Summary (Last 24 hours) at 10/18/13 0844 Last data filed at 10/18/13 0750  Gross per 24 hour  Intake      0 ml  Output    600 ml  Net   -600 ml   Filed Weights   10/17/13 2315 10/18/13 0427  Weight: 154 lb (69.854 kg) 144 lb 12.8 oz (65.681 kg)    PHYSICAL EXAM Filed Vitals:   10/18/13 0330 10/18/13 0350 10/18/13 0355 10/18/13 0427  BP: 111/66 112/77 106/62 110/71  Pulse: 96 100 93 103  Temp:    97.9 F (36.6 C)  TempSrc:    Oral  Resp: 24 19 18 18   Height:    5\' 1"  (1.549 m)  Weight:    144 lb 12.8 oz (65.681 kg)  SpO2: 93% 92% 95% 95%   General: Alert, oriented x3, no distress Head: no evidence of trauma, PERRL, EOMI, no exophtalmos or lid lag, no myxedema, no xanthelasma; normal ears, nose and oropharynx Neck: normal jugular venous pulsations and no hepatojugular reflux; brisk carotid pulses without delay and no carotid bruits Chest: clear to auscultation, no signs of consolidation by percussion or palpation, normal fremitus, symmetrical and full respiratory excursions Cardiovascular: normal position and quality of the apical impulse, irregular  rhythm, normal first and second heart sounds, no rubs or gallops, 1/6 early peaking systolic ejection murmur Abdomen: no tenderness or distention, no masses by palpation, no abnormal pulsatility or arterial bruits, normal bowel sounds, no hepatosplenomegaly Extremities: no clubbing, cyanosis or edema; 2+ radial, ulnar and brachial pulses bilaterally; 2+ right femoral, posterior tibial and dorsalis pedis pulses; 2+ left femoral, posterior tibial and dorsalis pedis pulses; no subclavian or femoral bruits Neurological: grossly nonfocal  LABS  CBC  Recent Labs  10/17/13 2331 10/18/13 0545  WBC 9.4 10.8*  HGB 12.8 11.4*  HCT 38.5 35.1*  MCV 87.9 88.0  PLT 261 643   Basic Metabolic Panel  Recent Labs  10/17/13 2331 10/18/13 0545  NA 136* 138  K 4.3 4.3  CL 95* 99  CO2 24 24  GLUCOSE 122* 106*  BUN 19 17  CREATININE 0.85 0.71  CALCIUM 9.2 9.1   Liver Function Tests No results found for this basename: AST, ALT, ALKPHOS, BILITOT, PROT, ALBUMIN,  in the last 72 hours No results found for this basename: LIPASE, AMYLASE,  in the last 72 hours Cardiac Enzymes  Recent Labs  10/18/13 0125 10/18/13 0454  TROPONINI 0.57* 0.63*    Radiology Studies Imaging results have been reviewed and Dg Chest Port 1 View  10/18/2013   CLINICAL DATA:  Central chest pain since earlier today.  EXAM: PORTABLE CHEST - 1 VIEW  COMPARISON:  10/12/2013  FINDINGS: Postoperative changes in the mediastinum. Shallow inspiration. Borderline heart size is probably normal for degree of inspiration. No focal airspace disease or consolidation in the lungs. No blunting of costophrenic angles. Calcified and tortuous aorta.  IMPRESSION: No active disease.   Electronically Signed   By: Lucienne Capers M.D.   On: 10/18/2013 00:11    TELE Afib  ECG AF, subtle (but new) ST depression and T wave inversion in I and aVL  ASSESSMENT AND PLAN NSTEMI with mildly elevated enzymes, new ECG changes and ongoing pain on IV  NTG and IV heparin. Unfortunately received Xarelto 15 mg at 1530h yesterday. Despite bleeding risk, best approach is still probably to proceed with coronary angio today. Will discuss with interventional colleagues. Transfer to ICU.   Sanda Klein, MD, [facility] [phone] office [phone] pager 10/18/2013 8:44 AM

## 2013-10-18 NOTE — H&P (View-Only) (Signed)
Patient Name: Brittany [name] Date of Encounter: 10/18/2013  Active Problems:   NSTEMI (non-ST elevated myocardial infarction)   Length of Stay: 1  SUBJECTIVE  Ongoing mid-chest "soreness" at rest despite escalating doses of IV NTG. Troponin I 0.66. Mild repol changes, most obvious in I and aVL. Chronic AF, controlled rate, on beta blocker. S/P bioprosthetic AVR 2005 (cors normal at that time, normal nuclear study 2013). Anticoagulated with Xarelto 15 mg daily, last dose 1530h on 07/09.  CURRENT MEDS . [START ON 10/19/2013] aspirin EC  81 mg Oral Daily  . atenolol  100 mg Oral Daily  . atorvastatin  20 mg Oral Daily  . cholecalciferol  1,000 Units Oral Daily  . fentaNYL  50 mcg Intravenous Once  . lisinopril  20 mg Oral BID  . oxybutynin  5 mg Oral TID  . pantoprazole  40 mg Oral Daily    OBJECTIVE   Intake/Output Summary (Last 24 hours) at 10/18/13 0844 Last data filed at 10/18/13 0750  Gross per 24 hour  Intake      0 ml  Output    600 ml  Net   -600 ml   Filed Weights   10/17/13 2315 10/18/13 0427  Weight: 154 lb (69.854 kg) 144 lb 12.8 oz (65.681 kg)    PHYSICAL EXAM Filed Vitals:   10/18/13 0330 10/18/13 0350 10/18/13 0355 10/18/13 0427  BP: 111/66 112/77 106/62 110/71  Pulse: 96 100 93 103  Temp:    97.9 F (36.6 C)  TempSrc:    Oral  Resp: 24 19 18 18   Height:    5\' 1"  (1.549 m)  Weight:    144 lb 12.8 oz (65.681 kg)  SpO2: 93% 92% 95% 95%   General: Alert, oriented x3, no distress Head: no evidence of trauma, PERRL, EOMI, no exophtalmos or lid lag, no myxedema, no xanthelasma; normal ears, nose and oropharynx Neck: normal jugular venous pulsations and no hepatojugular reflux; brisk carotid pulses without delay and no carotid bruits Chest: clear to auscultation, no signs of consolidation by percussion or palpation, normal fremitus, symmetrical and full respiratory excursions Cardiovascular: normal position and quality of the apical impulse, irregular  rhythm, normal first and second heart sounds, no rubs or gallops, 1/6 early peaking systolic ejection murmur Abdomen: no tenderness or distention, no masses by palpation, no abnormal pulsatility or arterial bruits, normal bowel sounds, no hepatosplenomegaly Extremities: no clubbing, cyanosis or edema; 2+ radial, ulnar and brachial pulses bilaterally; 2+ right femoral, posterior tibial and dorsalis pedis pulses; 2+ left femoral, posterior tibial and dorsalis pedis pulses; no subclavian or femoral bruits Neurological: grossly nonfocal  LABS  CBC  Recent Labs  10/17/13 2331 10/18/13 0545  WBC 9.4 10.8*  HGB 12.8 11.4*  HCT 38.5 35.1*  MCV 87.9 88.0  PLT 261 419   Basic Metabolic Panel  Recent Labs  10/17/13 2331 10/18/13 0545  NA 136* 138  K 4.3 4.3  CL 95* 99  CO2 24 24  GLUCOSE 122* 106*  BUN 19 17  CREATININE 0.85 0.71  CALCIUM 9.2 9.1   Liver Function Tests No results found for this basename: AST, ALT, ALKPHOS, BILITOT, PROT, ALBUMIN,  in the last 72 hours No results found for this basename: LIPASE, AMYLASE,  in the last 72 hours Cardiac Enzymes  Recent Labs  10/18/13 0125 10/18/13 0454  TROPONINI 0.57* 0.63*    Radiology Studies Imaging results have been reviewed and Dg Chest Port 1 View  10/18/2013   CLINICAL DATA:  Central chest pain since earlier today.  EXAM: PORTABLE CHEST - 1 VIEW  COMPARISON:  10/12/2013  FINDINGS: Postoperative changes in the mediastinum. Shallow inspiration. Borderline heart size is probably normal for degree of inspiration. No focal airspace disease or consolidation in the lungs. No blunting of costophrenic angles. Calcified and tortuous aorta.  IMPRESSION: No active disease.   Electronically Signed   By: Lucienne Capers M.D.   On: 10/18/2013 00:11    TELE Afib  ECG AF, subtle (but new) ST depression and T wave inversion in I and aVL  ASSESSMENT AND PLAN NSTEMI with mildly elevated enzymes, new ECG changes and ongoing pain on IV  NTG and IV heparin. Unfortunately received Xarelto 15 mg at 1530h yesterday. Despite bleeding risk, best approach is still probably to proceed with coronary angio today. Will discuss with interventional colleagues. Transfer to ICU.   Sanda Klein, MD, [facility] [phone] office [phone] pager 10/18/2013 8:44 AM

## 2013-10-19 DIAGNOSIS — R51 Headache: Secondary | ICD-10-CM

## 2013-10-19 DIAGNOSIS — I5032 Chronic diastolic (congestive) heart failure: Secondary | ICD-10-CM | POA: Diagnosis not present

## 2013-10-19 DIAGNOSIS — I4891 Unspecified atrial fibrillation: Secondary | ICD-10-CM | POA: Diagnosis not present

## 2013-10-19 DIAGNOSIS — I10 Essential (primary) hypertension: Secondary | ICD-10-CM

## 2013-10-19 DIAGNOSIS — R200 Anesthesia of skin: Secondary | ICD-10-CM | POA: Diagnosis present

## 2013-10-19 DIAGNOSIS — I214 Non-ST elevation (NSTEMI) myocardial infarction: Secondary | ICD-10-CM | POA: Diagnosis not present

## 2013-10-19 DIAGNOSIS — R209 Unspecified disturbances of skin sensation: Secondary | ICD-10-CM

## 2013-10-19 DIAGNOSIS — Z954 Presence of other heart-valve replacement: Secondary | ICD-10-CM

## 2013-10-19 LAB — BASIC METABOLIC PANEL WITH GFR
Anion gap: 13 (ref 5–15)
BUN: 11 mg/dL (ref 6–23)
CO2: 27 meq/L (ref 19–32)
Calcium: 8.9 mg/dL (ref 8.4–10.5)
Chloride: 98 meq/L (ref 96–112)
Creatinine, Ser: 0.71 mg/dL (ref 0.50–1.10)
GFR calc Af Amer: 89 mL/min — ABNORMAL LOW
GFR calc non Af Amer: 77 mL/min — ABNORMAL LOW
Glucose, Bld: 93 mg/dL (ref 70–99)
Potassium: 4.1 meq/L (ref 3.7–5.3)
Sodium: 138 meq/L (ref 137–147)

## 2013-10-19 LAB — CBC
HCT: 37.9 % (ref 36.0–46.0)
Hemoglobin: 12.2 g/dL (ref 12.0–15.0)
MCH: 28.5 pg (ref 26.0–34.0)
MCHC: 32.2 g/dL (ref 30.0–36.0)
MCV: 88.6 fL (ref 78.0–100.0)
Platelets: 257 K/uL (ref 150–400)
RBC: 4.28 MIL/uL (ref 3.87–5.11)
RDW: 13.5 % (ref 11.5–15.5)
WBC: 11 K/uL — ABNORMAL HIGH (ref 4.0–10.5)

## 2013-10-19 LAB — PROTIME-INR
INR: 1.12 (ref 0.00–1.49)
Prothrombin Time: 14.4 s (ref 11.6–15.2)

## 2013-10-19 MED ORDER — BLISTEX MEDICATED EX OINT
TOPICAL_OINTMENT | CUTANEOUS | Status: DC | PRN
  Filled 2013-10-19: qty 10

## 2013-10-19 MED ORDER — ATENOLOL 100 MG PO TABS
100.0000 mg | ORAL_TABLET | Freq: Every day | ORAL | Status: DC
  Administered 2013-10-20: 100 mg via ORAL
  Filled 2013-10-19: qty 1

## 2013-10-19 MED ORDER — NITROGLYCERIN 0.4 MG SL SUBL
SUBLINGUAL_TABLET | SUBLINGUAL | Status: AC
  Filled 2013-10-19: qty 1

## 2013-10-19 MED ORDER — MAGNESIUM HYDROXIDE 400 MG/5ML PO SUSP
30.0000 mL | Freq: Every day | ORAL | Status: DC | PRN
  Filled 2013-10-19: qty 30

## 2013-10-19 MED ORDER — WARFARIN SODIUM 5 MG PO TABS
5.0000 mg | ORAL_TABLET | Freq: Once | ORAL | Status: AC
  Administered 2013-10-19: 5 mg via ORAL
  Filled 2013-10-19: qty 1

## 2013-10-19 MED ORDER — NITROGLYCERIN 0.4 MG SL SUBL
SUBLINGUAL_TABLET | SUBLINGUAL | Status: AC
  Administered 2013-10-19: 12:00:00
  Filled 2013-10-19: qty 3

## 2013-10-19 MED ORDER — BIOTENE DRY MOUTH MT LIQD
15.0000 mL | Freq: Two times a day (BID) | OROMUCOSAL | Status: DC
  Administered 2013-10-20 – 2013-10-21 (×3): 15 mL via OROMUCOSAL

## 2013-10-19 MED ORDER — ATENOLOL 25 MG PO TABS: 125.0000 mg | ORAL_TABLET | Freq: Every day | ORAL | Status: DC

## 2013-10-19 MED ORDER — NITROGLYCERIN 0.4 MG SL SUBL
SUBLINGUAL_TABLET | SUBLINGUAL | Status: AC
  Administered 2013-10-19: 0.4 mg
  Filled 2013-10-19: qty 3

## 2013-10-19 MED ORDER — DILTIAZEM HCL ER COATED BEADS 120 MG PO TB24
120.0000 mg | ORAL_TABLET | Freq: Every day | ORAL | Status: DC
  Administered 2013-10-19 – 2013-10-21 (×3): 120 mg via ORAL
  Filled 2013-10-19 (×3): qty 1

## 2013-10-19 NOTE — Progress Notes (Signed)
CARDIAC REHAB PHASE I   PRE:  Rate/Rhythm: 102-120 afib  BP:  Supine:   Sitting: 153/83  Standing:    SaO2: 99%RA  MODE:  Ambulation: 350 ft   POST:  Rate/Rhythm: up to 158 afib walking and to 99 in room with rest  BP:  Supine:   Sitting: 144/101, 132/79, 119/65  Standing: .   SaO2: 97%RA 1045-1143 Pt walked 350 ft after I put foot brace on and she used rolling walker. Tolerated well. Pt denied any CP prior and during walk. As I was educating pt, she said she had CP middle of chest at 6 on scale of 1-10. Put on 2L oxygen and notified RN. As I talked with pt, she stated that she has had this feeling since cath and even before we walked. When I first walked in room to see pt, she was asleep. Pt stated it has been 5 on scale of 1-10 since procedure. Did not want to go back to bed from recliner. Education completed but only did brief diet ed as she then began with CP. Completed MI ed, stent, importance of plavix, NTG use, ex. Left heart healthy diet. Pt stated she had been informed to weigh daily and watch sodium due to recent admission with CHF. Discussed CRP 2 and pt gave permission to refer to [place]. Will follow up Monday if not discharged.   Graylon Good, RN BSN  10/19/2013 11:36 AM

## 2013-10-19 NOTE — Progress Notes (Signed)
ANTICOAGULATION CONSULT NOTE - Follow Up Consult  Pharmacy Consult for Coumadin Indication: atrial fibrillation  Allergies  Allergen Reactions  . Amoxicillin Diarrhea and Nausea And Vomiting  . Cephalexin Diarrhea  . Codeine Nausea Only  . Morphine And Related Other (See Comments)    HX ADDICTION / WITHDRAWAL  . Neurontin [Gabapentin] Swelling    Legs swell  . Nitrofurantoin Monohyd Macro Diarrhea and Nausea Only  . Prednisone Nausea Only  . Sulfa Antibiotics Rash    Patient Measurements: Height: 5\' 1"  (154.9 cm) Weight: 144 lb 12.8 oz (65.681 kg) IBW/kg (Calculated) : 47.8  Vital Signs: Temp: 98.1 F (36.7 C) (07/11 1249) Temp src: Oral (07/11 0900) BP: 123/82 mmHg (07/11 0605) Pulse Rate: 107 (07/11 0800)  Labs:  Recent Labs  10/17/13 2331  10/18/13 0454 10/18/13 0545 10/18/13 1434 10/18/13 1630 10/18/13 2110 10/19/13 0229  HGB 12.8  --   --  11.4*  --   --   --  12.2  HCT 38.5  --   --  35.1*  --   --   --  37.9  PLT 261  --   --  245  --   --   --  257  APTT  --   --   --  30  --   --   --   --   LABPROT  --   --   --   --   --  15.5*  --  14.4  INR  --   --   --   --   --  1.23  --  1.12  HEPARINUNFRC  --   --   --  0.26*  --   --   --   --   CREATININE 0.85  --   --  0.71  --   --   --  0.71  TROPONINI  --   < > 0.63*  --  <0.30  --  <0.30  --   < > = values in this interval not displayed.  Estimated Creatinine Clearance: 45.5 ml/min (by C-G formula based on Cr of 0.71).  Assessment:  s/p PCI, DES to RCA on 7/10. Changed from Xarelto 15 mg daily to Coumadin.  Last Xarelto dose estimated between 2-3:30pm on 7/9.  INR 1.23 prior to first dose of Coumadin, now 1.12 today.  Xarelto does not seem to be effecting INR at this point. Also on Aspirin 81 mg and Plavix 75 mg daily.   Goal of Therapy:  INR 2-3 Monitor platelets by anticoagulation protocol: Yes   Plan:   Repeat Coumadin 5 mg today.  Daily PT/INR.  Arty Baumgartner, [place] Pager:  [phone] 10/19/2013,1:44 PM

## 2013-10-19 NOTE — Progress Notes (Signed)
DAILY PROGRESS NOTE  Subjective:  No issues overnight. S/p PCI to the ostial RCA yesterday with excellent result. Chest pain has improved, still somewhat sore on the chest. EKG shows a-fib with mildly increased ventricular response. No ST changes. Troponin was elevated to 0.6 and is now normal. She is complaining of intermittent perioral numbness and tingling in her fingertips and feet.  Notable dry angular chelitis around the mouth.  Objective:  Temp:  [97.8 F (36.6 C)-98.8 F (37.1 C)] 98.5 F (36.9 C) (07/11 0900) Pulse Rate:  [66-114] 107 (07/11 0800) Resp:  [10-28] 17 (07/11 0800) BP: (54-163)/(27-90) 123/82 mmHg (07/11 0605) SpO2:  [91 %-99 %] 94 % (07/11 0800) Weight change:   Intake/Output from previous day: 07/10 0701 - 07/11 0700 In: 626 [P.O.:150; I.V.:476] Out: 1050 [Urine:1050]  Intake/Output from this shift: Total I/O In: 240 [P.O.:240] Out: -   Medications: Current Facility-Administered Medications  Medication Dose Route Frequency Provider Last Rate Last Dose  . acetaminophen (TYLENOL) tablet 650 mg  650 mg Oral Q4H PRN Grafton Folk, MD      . aspirin chewable tablet 81 mg  81 mg Oral Daily Jettie Booze, MD   81 mg at 10/19/13 0939  . atenolol (TENORMIN) tablet 100 mg  100 mg Oral Daily Grafton Folk, MD   100 mg at 10/19/13 0939  . atorvastatin (LIPITOR) tablet 20 mg  20 mg Oral Daily Mian A Yousuf, MD      . butalbital-acetaminophen-caffeine (FIORICET, ESGIC) 50-325-40 MG per tablet 1 tablet  1 tablet Oral Q6H PRN Grafton Folk, MD      . cholecalciferol (VITAMIN D) tablet 1,000 Units  1,000 Units Oral Daily Grafton Folk, MD   1,000 Units at 10/19/13 0939  . clopidogrel (PLAVIX) tablet 75 mg  75 mg Oral Q breakfast Jettie Booze, MD   75 mg at 10/19/13 0941  . fentaNYL (SUBLIMAZE) injection 50 mcg  50 mcg Intravenous Once Elyn Peers, MD      . HYDROmorphone (DILAUDID) injection 1 mg  1 mg Intravenous Q3H PRN Sanda Klein, MD   1 mg at  10/18/13 1512  . HYDROmorphone (DILAUDID) injection 1 mg  1 mg Intravenous Once Mihai Croitoru, MD      . lisinopril (PRINIVIL,ZESTRIL) tablet 20 mg  20 mg Oral BID Grafton Folk, MD   20 mg at 10/19/13 0940  . nitroGLYCERIN 0.2 mg/mL in dextrose 5 % infusion  20 mcg/min Intravenous Titrated Elyn Peers, MD   40 mcg/min at 10/18/13 1500  . ondansetron (ZOFRAN) injection 4 mg  4 mg Intravenous Q6H PRN Grafton Folk, MD      . oxybutynin (DITROPAN) tablet 5 mg  5 mg Oral TID Grafton Folk, MD   5 mg at 10/19/13 0940  . pantoprazole (PROTONIX) EC tablet 40 mg  40 mg Oral Daily Grafton Folk, MD   40 mg at 10/19/13 0939  . Warfarin - Pharmacist Dosing Inpatient   Does not apply [place], [facility]        Physical Exam: General appearance: alert and no distress Neck: no carotid bruit and no JVD Lungs: clear to auscultation bilaterally Heart: irregularly irregular rhythm Abdomen: soft, non-tender; bowel sounds normal; no masses,  no organomegaly Extremities: extremities normal, atraumatic, no cyanosis or edema Pulses: 2+ and symmetric Skin: perioral chelitis, dry skin Neurologic: Grossly normal Psych: Normal mood, affect  Lab Results: Results for orders placed during the hospital encounter of  10/17/13 (from the past 48 hour(s))  CBC     Status: None   Collection Time    10/17/13 11:31 PM      Result Value Ref Range   WBC 9.4  4.0 - 10.5 K/uL   RBC 4.38  3.87 - 5.11 MIL/uL   Hemoglobin 12.8  12.0 - 15.0 g/dL   HCT 38.5  36.0 - 46.0 %   MCV 87.9  78.0 - 100.0 fL   MCH 29.2  26.0 - 34.0 pg   MCHC 33.2  30.0 - 36.0 g/dL   RDW 13.1  11.5 - 15.5 %   Platelets 261  150 - 400 K/uL  BASIC METABOLIC PANEL     Status: Abnormal   Collection Time    10/17/13 11:31 PM      Result Value Ref Range   Sodium 136 (*) 137 - 147 mEq/L   Potassium 4.3  3.7 - 5.3 mEq/L   Chloride 95 (*) 96 - 112 mEq/L   CO2 24  19 - 32 mEq/L   Glucose, Bld 122 (*) 70 - 99 mg/dL   BUN 19  6 - 23 mg/dL    Creatinine, Ser 0.85  0.50 - 1.10 mg/dL   Calcium 9.2  8.4 - 10.5 mg/dL   GFR calc non Af Amer 61 (*) >90 mL/min   GFR calc Af Amer 71 (*) >90 mL/min   Comment: (NOTE)     The eGFR has been calculated using the CKD EPI equation.     This calculation has not been validated in all clinical situations.     eGFR's persistently <90 mL/min signify possible Chronic Kidney     Disease.   Anion gap 17 (*) 5 - 15  PRO B NATRIURETIC PEPTIDE     Status: Abnormal   Collection Time    10/17/13 11:31 PM      Result Value Ref Range   Pro B Natriuretic peptide (BNP) 1426.0 (*) 0 - 450 pg/mL  I-STAT TROPOININ, ED     Status: Abnormal   Collection Time    10/17/13 11:37 PM      Result Value Ref Range   Troponin i, poc 0.33 (*) 0.00 - 0.08 ng/mL   Comment NOTIFIED PHYSICIAN     Comment 3            Comment: Due to the release kinetics of cTnI,     a negative result within the first hours     of the onset of symptoms does not rule out     myocardial infarction with certainty.     If myocardial infarction is still suspected,     repeat the test at appropriate intervals.  TROPONIN I     Status: Abnormal   Collection Time    10/18/13  1:25 AM      Result Value Ref Range   Troponin I 0.57 (*) <0.30 ng/mL   Comment:            Due to the release kinetics of cTnI,     a negative result within the first hours     of the onset of symptoms does not rule out     myocardial infarction with certainty.     If myocardial infarction is still suspected,     repeat the test at appropriate intervals.     CRITICAL RESULT CALLED TO, READ BACK BY AND VERIFIED WITH:     LULIS A,RN 10/18/13 0201 WAYK  TROPONIN I  Status: Abnormal   Collection Time    10/18/13  4:54 AM      Result Value Ref Range   Troponin I 0.63 (*) <0.30 ng/mL   Comment:            Due to the release kinetics of cTnI,     a negative result within the first hours     of the onset of symptoms does not rule out     myocardial infarction  with certainty.     If myocardial infarction is still suspected,     repeat the test at appropriate intervals.     CRITICAL VALUE NOTED.  VALUE IS CONSISTENT WITH PREVIOUSLY REPORTED AND CALLED VALUE.  HEPARIN LEVEL (UNFRACTIONATED)     Status: Abnormal   Collection Time    10/18/13  5:45 AM      Result Value Ref Range   Heparin Unfractionated 0.26 (*) 0.30 - 0.70 IU/mL   Comment:            IF HEPARIN RESULTS ARE BELOW     EXPECTED VALUES, AND PATIENT     DOSAGE HAS BEEN CONFIRMED,     SUGGEST FOLLOW UP TESTING     OF ANTITHROMBIN III LEVELS.  APTT     Status: None   Collection Time    10/18/13  5:45 AM      Result Value Ref Range   aPTT 30  24 - 37 seconds  BASIC METABOLIC PANEL     Status: Abnormal   Collection Time    10/18/13  5:45 AM      Result Value Ref Range   Sodium 138  137 - 147 mEq/L   Potassium 4.3  3.7 - 5.3 mEq/L   Chloride 99  96 - 112 mEq/L   CO2 24  19 - 32 mEq/L   Glucose, Bld 106 (*) 70 - 99 mg/dL   BUN 17  6 - 23 mg/dL   Creatinine, Ser 0.71  0.50 - 1.10 mg/dL   Calcium 9.1  8.4 - 10.5 mg/dL   GFR calc non Af Amer 77 (*) >90 mL/min   GFR calc Af Amer 89 (*) >90 mL/min   Comment: (NOTE)     The eGFR has been calculated using the CKD EPI equation.     This calculation has not been validated in all clinical situations.     eGFR's persistently <90 mL/min signify possible Chronic Kidney     Disease.   Anion gap 15  5 - 15  CBC     Status: Abnormal   Collection Time    10/18/13  5:45 AM      Result Value Ref Range   WBC 10.8 (*) 4.0 - 10.5 K/uL   RBC 3.99  3.87 - 5.11 MIL/uL   Hemoglobin 11.4 (*) 12.0 - 15.0 g/dL   HCT 35.1 (*) 36.0 - 46.0 %   MCV 88.0  78.0 - 100.0 fL   MCH 28.6  26.0 - 34.0 pg   MCHC 32.5  30.0 - 36.0 g/dL   RDW 13.3  11.5 - 15.5 %   Platelets 245  150 - 400 K/uL  POCT ACTIVATED CLOTTING TIME     Status: None   Collection Time    10/18/13 10:55 AM      Result Value Ref Range   Activated Clotting Time 253    POCT ACTIVATED  CLOTTING TIME     Status: None   Collection Time    10/18/13 11:31  AM      Result Value Ref Range   Activated Clotting Time 281    MRSA PCR SCREENING     Status: None   Collection Time    10/18/13  1:47 PM      Result Value Ref Range   MRSA by PCR NEGATIVE  NEGATIVE   Comment:            The GeneXpert MRSA Assay (FDA     approved for NASAL specimens     only), is one component of a     comprehensive MRSA colonization     surveillance program. It is not     intended to diagnose MRSA     infection nor to guide or     monitor treatment for     MRSA infections.  TROPONIN I     Status: None   Collection Time    10/18/13  2:34 PM      Result Value Ref Range   Troponin I <0.30  <0.30 ng/mL   Comment:            Due to the release kinetics of cTnI,     a negative result within the first hours     of the onset of symptoms does not rule out     myocardial infarction with certainty.     If myocardial infarction is still suspected,     repeat the test at appropriate intervals.  PROTIME-INR     Status: Abnormal   Collection Time    10/18/13  4:30 PM      Result Value Ref Range   Prothrombin Time 15.5 (*) 11.6 - 15.2 seconds   INR 1.23  0.00 - 1.49  TROPONIN I     Status: None   Collection Time    10/18/13  9:10 PM      Result Value Ref Range   Troponin I <0.30  <0.30 ng/mL   Comment:            Due to the release kinetics of cTnI,     a negative result within the first hours     of the onset of symptoms does not rule out     myocardial infarction with certainty.     If myocardial infarction is still suspected,     repeat the test at appropriate intervals.  BASIC METABOLIC PANEL     Status: Abnormal   Collection Time    10/19/13  2:29 AM      Result Value Ref Range   Sodium 138  137 - 147 mEq/L   Potassium 4.1  3.7 - 5.3 mEq/L   Chloride 98  96 - 112 mEq/L   CO2 27  19 - 32 mEq/L   Glucose, Bld 93  70 - 99 mg/dL   BUN 11  6 - 23 mg/dL   Creatinine, Ser 0.71  0.50 - 1.10  mg/dL   Calcium 8.9  8.4 - 10.5 mg/dL   GFR calc non Af Amer 77 (*) >90 mL/min   GFR calc Af Amer 89 (*) >90 mL/min   Comment: (NOTE)     The eGFR has been calculated using the CKD EPI equation.     This calculation has not been validated in all clinical situations.     eGFR's persistently <90 mL/min signify possible Chronic Kidney     Disease.   Anion gap 13  5 - 15  CBC     Status: Abnormal   Collection  Time    10/19/13  2:29 AM      Result Value Ref Range   WBC 11.0 (*) 4.0 - 10.5 K/uL   RBC 4.28  3.87 - 5.11 MIL/uL   Hemoglobin 12.2  12.0 - 15.0 g/dL   HCT 37.9  36.0 - 46.0 %   MCV 88.6  78.0 - 100.0 fL   MCH 28.5  26.0 - 34.0 pg   MCHC 32.2  30.0 - 36.0 g/dL   RDW 13.5  11.5 - 15.5 %   Platelets 257  150 - 400 K/uL  PROTIME-INR     Status: None   Collection Time    10/19/13  2:29 AM      Result Value Ref Range   Prothrombin Time 14.4  11.6 - 15.2 seconds   INR 1.12  0.00 - 1.49    Imaging: Dg Chest Port 1 View  10/18/2013   CLINICAL DATA:  Central chest pain since earlier today.  EXAM: PORTABLE CHEST - 1 VIEW  COMPARISON:  10/12/2013  FINDINGS: Postoperative changes in the mediastinum. Shallow inspiration. Borderline heart size is probably normal for degree of inspiration. No focal airspace disease or consolidation in the lungs. No blunting of costophrenic angles. Calcified and tortuous aorta.  IMPRESSION: No active disease.   Electronically Signed   By: Lucienne Capers M.D.   On: 10/18/2013 00:11    Assessment:  Principal Problem:   NSTEMI (non-ST elevated myocardial infarction) Active Problems:   Aortic stenosis   Hypertension   S/P AVR (aortic valve replacement)   Headache   Chronic diastolic heart failure   Acute myocardial infarction, subendocardial infarction, initial episode of care   Perioral numbness   Plan:  1. Chest pain has improved s/p PCI. Cardiac enzymes have normalized. Complaints today of perioral numbness, headache, intermittent tingling in  fingers and toes. Plan to switch from Xarelto to warfarin, in addition to Plavix. She has a tissue AVR. Does not necessarily need to be therapeutic on warfarin prior to d/c. Ambulate with cardiac rehab. Add cardizem CD 120 mg daily for RVR. Anticipate possible d/c in the am tomorrow. Tylenol for headache. Oral care and topical barrier medication for cracked lips.  Time Spent Directly with Patient:  15 minutes  Length of Stay:  LOS: 2 days   Pixie Casino, MD, [facility] Attending Cardiologist [facility]  Zitlaly Malson C 10/19/2013, 10:00 AM

## 2013-10-20 DIAGNOSIS — R209 Unspecified disturbances of skin sensation: Secondary | ICD-10-CM | POA: Diagnosis not present

## 2013-10-20 DIAGNOSIS — T465X5A Adverse effect of other antihypertensive drugs, initial encounter: Secondary | ICD-10-CM

## 2013-10-20 DIAGNOSIS — I214 Non-ST elevation (NSTEMI) myocardial infarction: Secondary | ICD-10-CM | POA: Diagnosis not present

## 2013-10-20 DIAGNOSIS — Z9861 Coronary angioplasty status: Secondary | ICD-10-CM

## 2013-10-20 DIAGNOSIS — R059 Cough, unspecified: Secondary | ICD-10-CM

## 2013-10-20 DIAGNOSIS — R05 Cough: Secondary | ICD-10-CM

## 2013-10-20 DIAGNOSIS — I10 Essential (primary) hypertension: Secondary | ICD-10-CM | POA: Diagnosis not present

## 2013-10-20 DIAGNOSIS — I200 Unstable angina: Secondary | ICD-10-CM | POA: Diagnosis not present

## 2013-10-20 LAB — BASIC METABOLIC PANEL
Anion gap: 14 (ref 5–15)
BUN: 10 mg/dL (ref 6–23)
CALCIUM: 8.4 mg/dL (ref 8.4–10.5)
CO2: 25 mEq/L (ref 19–32)
Chloride: 93 mEq/L — ABNORMAL LOW (ref 96–112)
Creatinine, Ser: 0.58 mg/dL (ref 0.50–1.10)
GFR calc non Af Amer: 82 mL/min — ABNORMAL LOW (ref >90)
Glucose, Bld: 96 mg/dL (ref 70–99)
POTASSIUM: 3.9 meq/L (ref 3.7–5.3)
Sodium: 132 mEq/L — ABNORMAL LOW (ref 137–147)

## 2013-10-20 LAB — CBC
HCT: 31.9 % — ABNORMAL LOW (ref 36.0–46.0)
Hemoglobin: 10.4 g/dL — ABNORMAL LOW (ref 12.0–15.0)
MCH: 28.7 pg (ref 26.0–34.0)
MCHC: 32.6 g/dL (ref 30.0–36.0)
MCV: 87.9 fL (ref 78.0–100.0)
PLATELETS: 241 10*3/uL (ref 150–400)
RBC: 3.63 MIL/uL — AB (ref 3.87–5.11)
RDW: 13.3 % (ref 11.5–15.5)
WBC: 9.2 10*3/uL (ref 4.0–10.5)

## 2013-10-20 LAB — PROTIME-INR
INR: 1.04 (ref 0.00–1.49)
Prothrombin Time: 13.6 seconds (ref 11.6–15.2)

## 2013-10-20 MED ORDER — COUMADIN BOOK
Freq: Once | Status: AC
Start: 2013-10-20 — End: 2013-10-20
  Administered 2013-10-20: 17:00:00
  Filled 2013-10-20: qty 1

## 2013-10-20 MED ORDER — LORAZEPAM 2 MG/ML IJ SOLN
0.5000 mg | INTRAMUSCULAR | Status: AC
  Administered 2013-10-20: 0.5 mg via INTRAVENOUS
  Filled 2013-10-20: qty 1

## 2013-10-20 MED ORDER — IRBESARTAN 300 MG PO TABS
300.0000 mg | ORAL_TABLET | Freq: Every day | ORAL | Status: DC
  Administered 2013-10-20 – 2013-10-21 (×2): 300 mg via ORAL
  Filled 2013-10-20 (×3): qty 1

## 2013-10-20 MED ORDER — WARFARIN VIDEO: Freq: Once | Status: DC

## 2013-10-20 MED ORDER — ATENOLOL 50 MG PO TABS
50.0000 mg | ORAL_TABLET | Freq: Every day | ORAL | Status: DC
  Filled 2013-10-20: qty 1

## 2013-10-20 MED ORDER — WARFARIN SODIUM 5 MG PO TABS
5.0000 mg | ORAL_TABLET | Freq: Once | ORAL | Status: AC
  Administered 2013-10-20: 5 mg via ORAL
  Filled 2013-10-20: qty 1

## 2013-10-20 MED ORDER — LORAZEPAM 2 MG/ML IJ SOLN
0.5000 mg | Freq: Once | INTRAMUSCULAR | Status: AC
  Administered 2013-10-20: 0.5 mg via INTRAVENOUS
  Filled 2013-10-20: qty 1

## 2013-10-20 NOTE — Progress Notes (Addendum)
DAILY PROGRESS NOTE  Subjective:  No issues overnight. S/p PCI to the ostial RCA with excellent result. Chest pain has improved, still somewhat sore on the chest but this is chronic under the left rib. EKG shows a-fib with mildly increased ventricular response. No ST changes. Troponin was elevated to 0.6 and is now normal. Mouth dryness and soreness improved.  Objective:  Temp:  [98.1 F (36.7 C)-98.5 F (36.9 C)] 98.4 F (36.9 C) (07/12 0400) Pulse Rate:  [25-152] 57 (07/12 0600) Resp:  [11-24] 21 (07/12 0600) BP: (86-163)/(43-85) 132/72 mmHg (07/12 0600) SpO2:  [91 %-100 %] 97 % (07/12 0600) Weight change:   Intake/Output from previous day: 07/11 0701 - 07/12 0700 In: 600 [P.O.:600] Out: 900 [Urine:900]  Intake/Output from this shift:    Medications: Current Facility-Administered Medications  Medication Dose Route Frequency Provider Last Rate Last Dose  . acetaminophen (TYLENOL) tablet 650 mg  650 mg Oral Q4H PRN Grafton Folk, MD   650 mg at 10/19/13 2255  . antiseptic oral rinse (BIOTENE) solution 15 mL  15 mL Mouth Rinse q12n4p Pixie Casino, MD      . aspirin chewable tablet 81 mg  81 mg Oral Daily Jettie Booze, MD   81 mg at 10/19/13 0939  . atenolol (TENORMIN) tablet 100 mg  100 mg Oral Daily Pixie Casino, MD      . atorvastatin (LIPITOR) tablet 20 mg  20 mg Oral Daily Grafton Folk, MD      . butalbital-acetaminophen-caffeine (FIORICET, ESGIC) 50-325-40 MG per tablet 1 tablet  1 tablet Oral Q6H PRN Grafton Folk, MD   1 tablet at 10/19/13 1143  . cholecalciferol (VITAMIN D) tablet 1,000 Units  1,000 Units Oral Daily Grafton Folk, MD   1,000 Units at 10/19/13 0939  . clopidogrel (PLAVIX) tablet 75 mg  75 mg Oral Q breakfast Jettie Booze, MD   75 mg at 10/20/13 0814  . diltiazem (CARDIZEM LA) 24 hr tablet 120 mg  120 mg Oral Daily Pixie Casino, MD   120 mg at 10/19/13 1546  . fentaNYL (SUBLIMAZE) injection 50 mcg  50 mcg Intravenous Once  Elyn Peers, MD      . HYDROmorphone (DILAUDID) injection 1 mg  1 mg Intravenous Q3H PRN Sanda Klein, MD   1 mg at 10/18/13 1512  . HYDROmorphone (DILAUDID) injection 1 mg  1 mg Intravenous Once Mihai Croitoru, MD      . lip balm (BLISTEX) ointment   Topical PRN Pixie Casino, MD      . lisinopril (PRINIVIL,ZESTRIL) tablet 20 mg  20 mg Oral BID Grafton Folk, MD   20 mg at 10/19/13 2125  . magnesium hydroxide (MILK OF MAGNESIA) suspension 30 mL  30 mL Oral Daily PRN Pixie Casino, MD      . nitroGLYCERIN 0.2 mg/mL in dextrose 5 % infusion  20 mcg/min Intravenous Titrated Elyn Peers, MD   40 mcg/min at 10/18/13 1500  . ondansetron (ZOFRAN) injection 4 mg  4 mg Intravenous Q6H PRN Grafton Folk, MD      . oxybutynin (DITROPAN) tablet 5 mg  5 mg Oral TID Grafton Folk, MD   5 mg at 10/19/13 2125  . pantoprazole (PROTONIX) EC tablet 40 mg  40 mg Oral Daily Grafton Folk, MD   40 mg at 10/19/13 0939  . Warfarin - Pharmacist Dosing Inpatient   Does not apply [place], [facility]  Physical Exam: General appearance: alert and no distress Neck: no carotid bruit and no JVD Lungs: clear to auscultation bilaterally Heart: irregularly irregular rhythm Abdomen: soft, non-tender; bowel sounds normal; no masses,  no organomegaly Extremities: extremities normal, atraumatic, no cyanosis or edema Pulses: 2+ and symmetric Skin: perioral chelitis, dry skin Neurologic: Grossly normal Psych: Normal mood, affect  Lab Results: Results for orders placed during the hospital encounter of 10/17/13 (from the past 48 hour(s))  POCT ACTIVATED CLOTTING TIME     Status: None   Collection Time    10/18/13 10:55 AM      Result Value Ref Range   Activated Clotting Time 253    POCT ACTIVATED CLOTTING TIME     Status: None   Collection Time    10/18/13 11:31 AM      Result Value Ref Range   Activated Clotting Time 281    MRSA PCR SCREENING     Status: None   Collection Time    10/18/13   1:47 PM      Result Value Ref Range   MRSA by PCR NEGATIVE  NEGATIVE   Comment:            The GeneXpert MRSA Assay (FDA     approved for NASAL specimens     only), is one component of a     comprehensive MRSA colonization     surveillance program. It is not     intended to diagnose MRSA     infection nor to guide or     monitor treatment for     MRSA infections.  TROPONIN I     Status: None   Collection Time    10/18/13  2:34 PM      Result Value Ref Range   Troponin I <0.30  <0.30 ng/mL   Comment:            Due to the release kinetics of cTnI,     a negative result within the first hours     of the onset of symptoms does not rule out     myocardial infarction with certainty.     If myocardial infarction is still suspected,     repeat the test at appropriate intervals.  PROTIME-INR     Status: Abnormal   Collection Time    10/18/13  4:30 PM      Result Value Ref Range   Prothrombin Time 15.5 (*) 11.6 - 15.2 seconds   INR 1.23  0.00 - 1.49  TROPONIN I     Status: None   Collection Time    10/18/13  9:10 PM      Result Value Ref Range   Troponin I <0.30  <0.30 ng/mL   Comment:            Due to the release kinetics of cTnI,     a negative result within the first hours     of the onset of symptoms does not rule out     myocardial infarction with certainty.     If myocardial infarction is still suspected,     repeat the test at appropriate intervals.  BASIC METABOLIC PANEL     Status: Abnormal   Collection Time    10/19/13  2:29 AM      Result Value Ref Range   Sodium 138  137 - 147 mEq/L   Potassium 4.1  3.7 - 5.3 mEq/L   Chloride 98  96 - 112 mEq/L  CO2 27  19 - 32 mEq/L   Glucose, Bld 93  70 - 99 mg/dL   BUN 11  6 - 23 mg/dL   Creatinine, Ser 0.71  0.50 - 1.10 mg/dL   Calcium 8.9  8.4 - 10.5 mg/dL   GFR calc non Af Amer 77 (*) >90 mL/min   GFR calc Af Amer 89 (*) >90 mL/min   Comment: (NOTE)     The eGFR has been calculated using the CKD EPI equation.      This calculation has not been validated in all clinical situations.     eGFR's persistently <90 mL/min signify possible Chronic Kidney     Disease.   Anion gap 13  5 - 15  CBC     Status: Abnormal   Collection Time    10/19/13  2:29 AM      Result Value Ref Range   WBC 11.0 (*) 4.0 - 10.5 K/uL   RBC 4.28  3.87 - 5.11 MIL/uL   Hemoglobin 12.2  12.0 - 15.0 g/dL   HCT 37.9  36.0 - 46.0 %   MCV 88.6  78.0 - 100.0 fL   MCH 28.5  26.0 - 34.0 pg   MCHC 32.2  30.0 - 36.0 g/dL   RDW 13.5  11.5 - 15.5 %   Platelets 257  150 - 400 K/uL  PROTIME-INR     Status: None   Collection Time    10/19/13  2:29 AM      Result Value Ref Range   Prothrombin Time 14.4  11.6 - 15.2 seconds   INR 1.12  0.00 - 2.02  BASIC METABOLIC PANEL     Status: Abnormal   Collection Time    10/20/13  2:55 AM      Result Value Ref Range   Sodium 132 (*) 137 - 147 mEq/L   Potassium 3.9  3.7 - 5.3 mEq/L   Chloride 93 (*) 96 - 112 mEq/L   CO2 25  19 - 32 mEq/L   Glucose, Bld 96  70 - 99 mg/dL   BUN 10  6 - 23 mg/dL   Creatinine, Ser 0.58  0.50 - 1.10 mg/dL   Calcium 8.4  8.4 - 10.5 mg/dL   GFR calc non Af Amer 82 (*) >90 mL/min   GFR calc Af Amer >90  >90 mL/min   Comment: (NOTE)     The eGFR has been calculated using the CKD EPI equation.     This calculation has not been validated in all clinical situations.     eGFR's persistently <90 mL/min signify possible Chronic Kidney     Disease.   Anion gap 14  5 - 15  CBC     Status: Abnormal   Collection Time    10/20/13  2:55 AM      Result Value Ref Range   WBC 9.2  4.0 - 10.5 K/uL   RBC 3.63 (*) 3.87 - 5.11 MIL/uL   Hemoglobin 10.4 (*) 12.0 - 15.0 g/dL   HCT 31.9 (*) 36.0 - 46.0 %   MCV 87.9  78.0 - 100.0 fL   MCH 28.7  26.0 - 34.0 pg   MCHC 32.6  30.0 - 36.0 g/dL   RDW 13.3  11.5 - 15.5 %   Platelets 241  150 - 400 K/uL  PROTIME-INR     Status: None   Collection Time    10/20/13  2:55 AM      Result Value Ref Range  Prothrombin Time 13.6  11.6 - 15.2  seconds   INR 1.04  0.00 - 1.49    Imaging: No results found.  Assessment:  Principal Problem:   NSTEMI (non-ST elevated myocardial infarction) Active Problems:   Aortic stenosis   Hypertension   S/P AVR (aortic valve replacement)   Headache   Chronic diastolic heart failure   Acute myocardial infarction, subendocardial infarction, initial episode of care   Perioral numbness   Plan:  Chest pain has improved s/p PCI. Cardiac enzymes have normalized. BP improved today. Rate control improved. Still feels weak, not ready to go home. Ambulated only a short distance with CR yesterday, ? Need for PT evaluation.  She lives with son and daughter - has some support at home. Reports mild chronic cough, may be due to lisinopril (in fact, this was listed as an allergy in 2012) - she was on 20 mg BID. Switch to irbesartan 300 mg daily. Okay to transfer to the floor today. Anticipate d/c home tomorrow.  Time Spent Directly with Patient:  15 minutes  Length of Stay:  LOS: 3 days   Pixie Casino, MD, [facility] Attending Cardiologist [facility]  HILTY,Kenneth C 10/20/2013, 8:54 AM

## 2013-10-20 NOTE — Discharge Instructions (Signed)

## 2013-10-20 NOTE — Progress Notes (Addendum)
Hr staying in the 40's.Pt asymptomatic. Sitting up in the chair,not sleeping. D Dunn PA made aware. Order received to d/c Tenormin. Will monitor.                                        Addendum- dose lowered to 50 mg daily of Tenormin

## 2013-10-20 NOTE — Progress Notes (Signed)
Pt reported feeling restless and anxious. She requested something for anxiety. Cardiology MD order 0.5mg  of Ativan. Will continue to assess pt.

## 2013-10-20 NOTE — Progress Notes (Signed)
Pt reported having 9/10 chest pain. 1 sublingual nitro and PRN Tylenol was given; Pt reported that pain went away after taking medication. Will continue to monitor pt.

## 2013-10-20 NOTE — Progress Notes (Addendum)
ANTICOAGULATION CONSULT NOTE - Follow Up Consult  Pharmacy Consult for warfarin Indication: atrial fibrillation  Allergies  Allergen Reactions  . Amoxicillin Diarrhea and Nausea And Vomiting  . Cephalexin Diarrhea  . Codeine Nausea Only  . Lisinopril Cough  . Morphine And Related Other (See Comments)    HX ADDICTION / WITHDRAWAL  . Neurontin [Gabapentin] Swelling    Legs swell  . Nitrofurantoin Monohyd Macro Diarrhea and Nausea Only  . Prednisone Nausea Only  . Sulfa Antibiotics Rash    Patient Measurements: Height: 5\' 1"  (154.9 cm) Weight: 145 lb 8 oz (65.998 kg) (scale A) IBW/kg (Calculated) : 47.8  Vital Signs: Temp: 98.4 F (36.9 C) (07/12 1420) Temp src: Oral (07/12 1420) BP: 108/65 mmHg (07/12 1420) Pulse Rate: 68 (07/12 1420)  Labs:  Recent Labs  10/17/13 2331  10/18/13 0454 10/18/13 0545 10/18/13 1434 10/18/13 1630 10/18/13 2110 10/19/13 0229 10/20/13 0255  HGB 12.8  --   --  11.4*  --   --   --  12.2 10.4*  HCT 38.5  --   --  35.1*  --   --   --  37.9 31.9*  PLT 261  --   --  245  --   --   --  257 241  APTT  --   --   --  30  --   --   --   --   --   LABPROT  --   --   --   --   --  15.5*  --  14.4 13.6  INR  --   --   --   --   --  1.23  --  1.12 1.04  HEPARINUNFRC  --   --   --  0.26*  --   --   --   --   --   CREATININE 0.85  --   --  0.71  --   --   --  0.71 0.58  TROPONINI  --   < > 0.63*  --  <0.30  --  <0.30  --   --   < > = values in this interval not displayed.  Estimated Creatinine Clearance: 45.5 ml/min (by C-G formula based on Cr of 0.58).  Assessment: 80 y/o female s/p PCI, DES to RCA on 7/10. Changed from Xarelto 15 mg daily to Coumadin for Afib.  Last Xarelto dose estimated between 2-3:30pm on 7/9.    INR is subtherapeutic at 1.04 today. No bleeding noted, CBC is stable.  Goal of Therapy:  INR 2-3 Monitor platelets by anticoagulation protocol: Yes   Plan:  Warfarin 5 mg PO today Daily PT/INR Watch for s/sx of bleeding  especially with DAPT  [facility], Pharm.D., BCPS Clinical Pharmacist Pager: [phone] 10/20/2013 3:04 PM

## 2013-10-21 ENCOUNTER — Encounter: Payer: Medicare Other | Admitting: Cardiovascular Disease

## 2013-10-21 DIAGNOSIS — I200 Unstable angina: Secondary | ICD-10-CM | POA: Diagnosis not present

## 2013-10-21 LAB — BASIC METABOLIC PANEL
Anion gap: 13 (ref 5–15)
BUN: 8 mg/dL (ref 6–23)
CALCIUM: 9 mg/dL (ref 8.4–10.5)
CO2: 26 mEq/L (ref 19–32)
Chloride: 95 mEq/L — ABNORMAL LOW (ref 96–112)
Creatinine, Ser: 0.55 mg/dL (ref 0.50–1.10)
GFR, EST NON AFRICAN AMERICAN: 84 mL/min — AB (ref >90)
Glucose, Bld: 91 mg/dL (ref 70–99)
POTASSIUM: 4.2 meq/L (ref 3.7–5.3)
Sodium: 134 mEq/L — ABNORMAL LOW (ref 137–147)

## 2013-10-21 LAB — PROTIME-INR
INR: 1.28 (ref 0.00–1.49)
PROTHROMBIN TIME: 16 s — AB (ref 11.6–15.2)

## 2013-10-21 LAB — CBC
HCT: 37.4 % (ref 36.0–46.0)
Hemoglobin: 12.1 g/dL (ref 12.0–15.0)
MCH: 28.5 pg (ref 26.0–34.0)
MCHC: 32.4 g/dL (ref 30.0–36.0)
MCV: 88 fL (ref 78.0–100.0)
PLATELETS: 276 10*3/uL (ref 150–400)
RBC: 4.25 MIL/uL (ref 3.87–5.11)
RDW: 13.3 % (ref 11.5–15.5)
WBC: 9.3 10*3/uL (ref 4.0–10.5)

## 2013-10-21 LAB — PLATELET INHIBITION P2Y12: PLATELET FUNCTION P2Y12: 289 [PRU] (ref 194–418)

## 2013-10-21 MED ORDER — TICAGRELOR 90 MG PO TABS: 90.0000 mg | ORAL_TABLET | Freq: Two times a day (BID) | ORAL | Status: DC

## 2013-10-21 MED ORDER — ATENOLOL 25 MG PO TABS: 25.0000 mg | ORAL_TABLET | Freq: Every day | ORAL | Status: DC

## 2013-10-21 MED ORDER — ASPIRIN 81 MG PO CHEW: 81.0000 mg | CHEWABLE_TABLET | Freq: Every day | ORAL | Status: DC

## 2013-10-21 MED ORDER — WARFARIN SODIUM 5 MG PO TABS: 7.5000 mg | ORAL_TABLET | Freq: Once | ORAL | Status: DC

## 2013-10-21 MED ORDER — WARFARIN SODIUM 5 MG PO TABS: 7.5000 mg | ORAL_TABLET | Freq: Every day | ORAL | Status: DC

## 2013-10-21 MED ORDER — WARFARIN SODIUM 7.5 MG PO TABS
7.5000 mg | ORAL_TABLET | Freq: Once | ORAL | Status: AC
  Administered 2013-10-21: 7.5 mg via ORAL
  Filled 2013-10-21 (×2): qty 1

## 2013-10-21 MED ORDER — ATENOLOL 25 MG PO TABS
25.0000 mg | ORAL_TABLET | Freq: Every day | ORAL | Status: DC
  Administered 2013-10-21: 25 mg via ORAL
  Filled 2013-10-21: qty 1

## 2013-10-21 MED ORDER — IRBESARTAN 300 MG PO TABS: 300.0000 mg | ORAL_TABLET | Freq: Every day | ORAL | Status: DC

## 2013-10-21 MED ORDER — TICAGRELOR 90 MG PO TABS
90.0000 mg | ORAL_TABLET | Freq: Two times a day (BID) | ORAL | Status: DC
  Administered 2013-10-21: 90 mg via ORAL
  Filled 2013-10-21 (×2): qty 1

## 2013-10-21 MED ORDER — DILTIAZEM HCL ER COATED BEADS 120 MG PO TB24: 120.0000 mg | ORAL_TABLET | Freq: Every day | ORAL | Status: DC

## 2013-10-21 NOTE — Progress Notes (Signed)
CARDIAC REHAB PHASE I   PRE:  Rate/Rhythm: 62 afib    BP: sitting 119/66    SaO2:   MODE:  Ambulation: 460 ft   POST:  Rate/Rhythm: 87 afib    BP: sitting 121/73     SaO2: 99 RA  Tolerated well with RW. Plans to use RW at home. HR and BP controlled. No c/o CP, feeling much improved. Lives with son. Discussed Vitamin K. [phone]   Brittany [name] [place] CES, ACSM 10/21/2013 11:23 AM

## 2013-10-21 NOTE — Progress Notes (Signed)
UR completed Zion Ta K. Vannie Hochstetler, RN, BSN, [place], CCM  10/21/2013 7:51 PM

## 2013-10-21 NOTE — Progress Notes (Signed)
Ordered to give berlinta and coumadin prior DC.  Orders carried out, will continue to monitor.

## 2013-10-21 NOTE — Discharge Summary (Signed)
Physician Discharge Summary      Patient ID: Brittany [name] MRN: [id number] DOB/AGE: Jul 17, 1927 80 y.o.  Admit date: 10/17/2013 Discharge date: 10/21/2013  Admission Diagnoses:  NSTEMI  Discharge Diagnoses:  Principal Problem:   NSTEMI (non-ST elevated myocardial infarction) Active Problems:   Aortic stenosis   Hypertension   S/P AVR (aortic valve replacement)   Headache   Chronic diastolic heart failure   Acute myocardial infarction, subendocardial infarction, initial episode of care   Perioral numbness   Discharged Condition: stable  Hospital Course:   80 year old Caucasian female with past medical history significant for hypertension, hyperlipidemia, history of CVA permanent atrial fibrillation on Xarelto for the last 2 years, and history of aortic valve replacement with bioprosthetic valve in 2005, here with chest pain.  She was recently admitted to the hospital for management of malignant HTN , diastolic HF and headache. She was seen by Dr Haroldine Laws and Goessel on 10/16/2013. She returns within 24 hrs for symptoms of chest pain . Pt states that earlier in the afternoon while she was trying to sleep post lunch she started experiencing soreness , heaviness "someone sitting on my chest" on the left side radiating to left arm with associated mild SOB. This improved with SL NTG , however, she continues to have some chest pain. Pt denied any orthopnea, PND , LE edema , syncope, claudication , palpitation etc .  She reports medication compliance. She bruises easily in her skin and once episodes of epistaxis several months ago.   She was admitted.  Xarelto was held in anticipation of coronary angiography.  ASA, statin, BB, IV NTG and heparin added.  She ruled in for NSTEMI with troponin of 0.63.  Left heart cath revealed severe, fibrotic disease in the ostial right coronary artery which was the culprit lesion. This was successfully stented with a 2.5 x 14 resolute drug-eluting stent,  postdilated to 3.3 mm in diameter.  She was started on plavix, however, a P2Y12 came back at 289 so she was started on Brilinta.  Cardizem cd 120 was added for afib RVR with improvement.  Due to mild cough Lisinopril was changed to irbesartan.  Xarelto was changed to coumadin and she will follow up two days after DC in the coumadin clinic.  The patient was seen by Dr. Mare Ferrari who felt she was stable for DC home.    Consults:  Cardiac rehab, Pharmacy   Significant Diagnostic Studies:  LEft heart cath PROCEDURE: Left heart catheterization with selective coronary angiography, left ventriculogram. PCI ostial RCA, right upper extremity angiogram  INDICATIONS: NSTEMI  The risks, benefits, and details of the procedure were explained to the patient. The patient verbalized understanding and wanted to proceed. Informed written consent was obtained.  PROCEDURE TECHNIQUE: After Xylocaine anesthesia a 82F slender sheath was placed in the right radial artery with a single anterior needle wall stick. IV heparin was given. Right coronary angiography was done using a Judkins R4 guide catheter. Left coronary angiography was done using a Judkins L3.5 guide catheter. Left ventriculography was done using a pigtail catheter. The intervention was performed. A TR band was used for hemostasis.  CONTRAST: Total of 150 cc.  COMPLICATIONS: None.  HEMODYNAMICS: Aortic pressure was 173/109;The aortic valve was not crossed.  ANGIOGRAPHIC DATA: The left main coronary artery is widely.  The left anterior descending artery is a large vessel which wraps around the apex. In the mid LAD, there is moderate disease. There is a small diagonal which is patent. The second  diagonal is moderate in size with a 50% ostial stenosis. The mid to distal LAD is widely patent.  The left circumflex artery is a large, codominant vessel. There is a large first obtuse marginal which is patent. The second obtuse marginal is patent with moderate disease  proximally. There is a medium size third obtuse marginal which is also patent.  The right coronary artery is a large, codominant vessel. There is an ostial, 90% stenosis with significant pressure damping with vessel engagement. There is an early bifurcation of the posterior lateral and posterior descending artery. Both branches appear patent.  LEFT VENTRICULOGRAM: Left ventricular angiogram was not done.  PCI NARRATIVE: A JR 4 guide with sideholes was placed. IV heparin was given. An ACT was used to check that the heparin was therapeutic. Several doses of nitroglycerin were given intracoronary during the case. A pro-water wire was placed across the area disease in the RCA. A 2.5 x 12 balloon was used to predilate the ostium of the right coronary artery. A 2.5 x 14 resolute drug-eluting stent was deployed across the ostium. The stent balloon was used to flare the ostium. A 3.0 x 12 noncompliant balloon was used to post dilate the stented segment. There still appeared to be a waist at the ostium. A 3.25 x 8 noncompliant balloon was then used to post dilate as well. There is still mild waist at the ostium. The best view to visualize this was 52 LAO. TIMI-3 flow was maintained throughout. The initial pressure damping that was present resolved after the stent placement. The patient did have significant discomfort with balloon inflations.  IMPRESSIONS:  1. Normal left main coronary artery. 2. Moderate disease in the mid left anterior descending artery. 3. Mild to moderate disease in the left circumflex artery and its branches. 4. Severe, fibrotic disease in the ostial right coronary artery which was the culprit for today's presentation. This was successfully stented with a 2.5 x 14 resolute drug-eluting stent, postdilated to 3.3 mm in diameter. 5. Left ventricular systolic function not assessed due to aortic valve replacement.Marland Kitchen  RECOMMENDATION: Continue dual antiplatelet therapy while she is in the hospital. I  spoke with Dr. Acie Fredrickson. We'll plan on sending her home on warfarin and Plavix. Check P2 Y. 12 platelet testing in the morning. Continue aggressive secondary prevention. Her LAD disease did not appear significant angiographically. Followup with Dr. Acie Fredrickson.   Treatments: See above   Discharge Exam: Blood pressure 119/62, pulse 62, temperature 97.7 F (36.5 C), temperature source Oral, resp. rate 19, height 5\' 1"  (1.549 m), weight 145 lb 14.4 oz (66.18 kg), SpO2 97.00%.   Disposition: 06-Home-Health Care Svc      Discharge Instructions   Amb Referral to Cardiac Rehabilitation    Complete by:  As directed      Diet - low sodium heart healthy    Complete by:  As directed      Increase activity slowly    Complete by:  As directed             Medication List    STOP taking these medications       furosemide 40 MG tablet  Commonly known as:  LASIX     lisinopril 20 MG tablet  Commonly known as:  PRINIVIL,ZESTRIL     potassium chloride SA 20 MEQ tablet  Commonly known as:  K-DUR,KLOR-CON     Rivaroxaban 15 MG Tabs tablet  Commonly known as:  XARELTO      TAKE these medications  atenolol 25 MG tablet  Commonly known as:  TENORMIN  Take 1 tablet (25 mg total) by mouth daily.     atorvastatin 20 MG tablet  Commonly known as:  LIPITOR  Take 20 mg by mouth daily.     butalbital-acetaminophen-caffeine 50-325-40 MG per tablet  Commonly known as:  FIORICET  Take 1 tablet by mouth every 6 (six) hours as needed for headache.     cholecalciferol 1000 UNITS tablet  Commonly known as:  VITAMIN D  Take 1,000 Units by mouth daily.     diltiazem 120 MG 24 hr tablet  Commonly known as:  CARDIZEM LA  Take 1 tablet (120 mg total) by mouth daily.     FISH OIL PO  Take 1 g by mouth daily.     irbesartan 300 MG tablet  Commonly known as:  AVAPRO  Take 1 tablet (300 mg total) by mouth daily.     multivitamin with minerals tablet  Take 1 tablet by mouth daily.      omeprazole 20 MG capsule  Commonly known as:  PRILOSEC  Take 20 mg by mouth 2 (two) times daily before a meal.     oxybutynin 5 MG tablet  Commonly known as:  DITROPAN  Take 5 mg by mouth 3 (three) times daily.     pantoprazole 40 MG tablet  Commonly known as:  PROTONIX  Take 40 mg by mouth daily.     ticagrelor 90 MG Tabs tablet  Commonly known as:  BRILINTA  Take 1 tablet (90 mg total) by mouth 2 (two) times daily.     warfarin 5 MG tablet  Commonly known as:  COUMADIN  Take 1.5 tablets (7.5 mg total) by mouth one time only at 6 PM.       Follow-up Information   Follow up with Darden Amber., MD On 11/06/2013. (4:00 PM)    Specialty:  Cardiology   Contact information:   [address] [address] [phone]       Follow up with [facility]in [facility]. (2:30 PM)    Contact information:   [place] [address] [place] [address] [phone]     Greater than 30 minutes was spent completing the patient's discharge.    SignedTarri Fuller, [place] 10/21/2013, 11:25 AM

## 2013-10-21 NOTE — Care Management Note (Addendum)
  Page 2 of 2   10/22/2013     12:37:39 PM CARE MANAGEMENT NOTE 10/22/2013  Patient:  Brittany [name], Brittany [name]   Account Number:  [id number]  Date Initiated:  10/21/2013  Documentation initiated by:  Eliah Marquard  Subjective/Objective Assessment:   Admitted with NSTEMI and Chest Pain     Action/Plan:   CM to follow for disposition needs   Anticipated DC Date:  10/21/2013   Anticipated DC Plan:  [place]  CM consult  Medication Assistance      [facility] Choice  NA   Choice offered to / List presented to:             Status of service:  Completed, signed off Medicare Important Message given?  YES (If response is "NO", the following Medicare IM given date fields will be blank) Date Medicare IM given:  10/21/2013 Medicare IM given by:  Nancylee Gaines Date Additional Medicare IM given:   Additional Medicare IM given by:    Discharge Disposition:  HOME/SELF CARE  Per UR Regulation:  Reviewed for med. necessity/level of care/duration of stay  If discussed at [place] of Stay Meetings, dates discussed:   10/22/2013    Comments:  Mariann Laster RN, BSN, MSHL, CCM  Nurse - Case Manager,  (Unit [facility] [phone]  10/22/2013 Recommendations at LOS:   engage [facility]. Corliss Blacker with [facility] updates that patient has been contacted and activated with [facility] services. CM clarified that no HHS was set up but that CM provided VE that the [employer] would engage member via phone call f/u.   Ayjah Show RN, BSN, MSHL, CCM  Nurse - Case Manager,  (Unit [place] [phone]  10/21/2013 Social:  From home.  Supportive Son:  Brittany, [name] [phone] Benefits Check: ticagrelor (BRILINTA) tablet 90 mg  :  Dose 90 mg  :  Oral :  2 times daily Please f/u on coverage, co-pay, authorizations, deductibles, preferred pharmacy. Per CMA handoff report:  ---10/21/2013 1430 by NIA SHEALY---PT COPAY WILL $2,356.29- NO PRIOR AUTH REQUIRED CM contacted Nia  for claification and confirmed the above about. CM contacted Brilinta Rep:  Mammie Lorenzo to discuss mediction assistance options.   Glenard Haring instructs to have patient activate Brilinta card and request insurance verification.  CM discussed updates with patient and son. Son contacted his Brittany [name] Pharmacist who verifies co-pay is 84.97.   CM spoke with pharmacist while on phone to clarify our findings and pharmacist ran a 2nd time to confirm. CM provided 30 day free card. Patient denied any further needs in the home. Patient for discharge today. Dispositon Plan:  Home / [place]

## 2013-10-21 NOTE — Progress Notes (Signed)
ANTICOAGULATION CONSULT NOTE - Follow Up Consult  Pharmacy Consult:  Coumadin Indication: atrial fibrillation  Allergies  Allergen Reactions  . Amoxicillin Diarrhea and Nausea And Vomiting  . Cephalexin Diarrhea  . Codeine Nausea Only  . Lisinopril Cough  . Morphine And Related Other (See Comments)    HX ADDICTION / WITHDRAWAL  . Neurontin [Gabapentin] Swelling    Legs swell  . Nitrofurantoin Monohyd Macro Diarrhea and Nausea Only  . Prednisone Nausea Only  . Sulfa Antibiotics Rash    Patient Measurements: Height: 5\' 1"  (154.9 cm) Weight: 145 lb 14.4 oz (66.18 kg) (scale a) IBW/kg (Calculated) : 47.8  Vital Signs: Temp: 97.7 F (36.5 C) (07/13 0417) Temp src: Oral (07/13 0417) BP: 139/91 mmHg (07/13 0417) Pulse Rate: 103 (07/13 0417)  Labs:  Recent Labs  10/18/13 1434  10/18/13 2110  10/19/13 0229 10/20/13 0255 10/21/13 0423  HGB  --   --   --   < > 12.2 10.4* 12.1  HCT  --   --   --   --  37.9 31.9* 37.4  PLT  --   --   --   --  257 241 276  LABPROT  --   < >  --   --  14.4 13.6 16.0*  INR  --   < >  --   --  1.12 1.04 1.28  CREATININE  --   --   --   --  0.71 0.58 0.55  TROPONINI <0.30  --  <0.30  --   --   --   --   < > = values in this interval not displayed.  Estimated Creatinine Clearance: 45.6 ml/min (by C-G formula based on Cr of 0.55).    Assessment: 65 YOF s/p DES to RCA on 10/18/13 and was changed from Xarelto 15mg  daily to Coumadin for history of Afib.  INR remains sub-therapeutic post Coumadin 5mg  PO x3 doses.  No bleeding reported.   Goal of Therapy:  INR 2-3 Monitor platelets by anticoagulation protocol: Yes    Plan:  - Coumadin 7.5mg  PO today - Daily PT / INR    Rajinder Mesick D. Mina Marble, PharmD, BCPS Pager:  [phone] 10/21/2013, 7:38 AM

## 2013-10-21 NOTE — Progress Notes (Signed)
Patient Name: Brittany [name] Date of Encounter: 10/21/2013     Principal Problem:   NSTEMI (non-ST elevated myocardial infarction) Active Problems:   Aortic stenosis   Hypertension   S/P AVR (aortic valve replacement)   Headache   Chronic diastolic heart failure   Acute myocardial infarction, subendocardial infarction, initial episode of care   Perioral numbness    SUBJECTIVE  The patient feels better today.  No significant chest pain or dyspnea.  She tolerated walking a mile yesterday.  She has chronic atrial fibrillation with slow ventricular response.  We will reduce her atenolol to just 25 mg daily  CURRENT MEDS . antiseptic oral rinse  15 mL Mouth Rinse q12n4p  . aspirin  81 mg Oral Daily  . atenolol  25 mg Oral Daily  . atorvastatin  20 mg Oral Daily  . cholecalciferol  1,000 Units Oral Daily  . clopidogrel  75 mg Oral Q breakfast  . diltiazem  120 mg Oral Daily  . fentaNYL  50 mcg Intravenous Once  .  HYDROmorphone (DILAUDID) injection  1 mg Intravenous Once  . irbesartan  300 mg Oral Daily  . oxybutynin  5 mg Oral TID  . pantoprazole  40 mg Oral Daily  . warfarin  7.5 mg Oral ONCE-1800  . warfarin   Does not apply Once  . Warfarin - Pharmacist Dosing Inpatient   Does not apply q1800    OBJECTIVE  Filed Vitals:   10/20/13 1420 10/20/13 2001 10/21/13 0217 10/21/13 0417  BP: 108/65 113/78 112/82 139/91  Pulse: 68 67 60 103  Temp: 98.4 F (36.9 C) 97.4 F (36.3 C) 98.1 F (36.7 C) 97.7 F (36.5 C)  TempSrc: Oral Axillary Oral Oral  Resp: 20 19 18 19   Height: 5\' 1"  (1.549 m)     Weight: 145 lb 8 oz (65.998 kg)   145 lb 14.4 oz (66.18 kg)  SpO2: 98% 99% 99% 97%    Intake/Output Summary (Last 24 hours) at 10/21/13 0754 Last data filed at 10/20/13 2128  Gross per 24 hour  Intake    320 ml  Output      1 ml  Net    319 ml   Filed Weights   10/18/13 0427 10/20/13 1420 10/21/13 0417  Weight: 144 lb 12.8 oz (65.681 kg) 145 lb 8 oz (65.998 kg) 145 lb  14.4 oz (66.18 kg)    PHYSICAL EXAM  General: Pleasant, NAD. Neuro: Alert and oriented X 3. Moves all extremities spontaneously. Psych: Normal affect. HEENT:  Normal  Neck: Supple without bruits or JVD. Lungs:  Resp regular and unlabored, CTA. Heart: RRR no s3, s4, or murmurs. Abdomen: Soft, non-tender, non-distended, BS + x 4.  Extremities: No clubbing, cyanosis or edema. DP/PT/Radials 2+ and equal bilaterally.  Accessory Clinical Findings  CBC  Recent Labs  10/20/13 0255 10/21/13 0423  WBC 9.2 9.3  HGB 10.4* 12.1  HCT 31.9* 37.4  MCV 87.9 88.0  PLT 241 350   Basic Metabolic Panel  Recent Labs  10/20/13 0255 10/21/13 0423  NA 132* 134*  K 3.9 4.2  CL 93* 95*  CO2 25 26  GLUCOSE 96 91  BUN 10 8  CREATININE 0.58 0.55  CALCIUM 8.4 9.0   Liver Function Tests No results found for this basename: AST, ALT, ALKPHOS, BILITOT, PROT, ALBUMIN,  in the last 72 hours No results found for this basename: LIPASE, AMYLASE,  in the last 72 hours Cardiac Enzymes  Recent Labs  10/18/13  1434 10/18/13 2110  TROPONINI <0.30 <0.30   BNP No components found with this basename: POCBNP,  D-Dimer No results found for this basename: DDIMER,  in the last 72 hours Hemoglobin A1C No results found for this basename: HGBA1C,  in the last 72 hours Fasting Lipid Panel No results found for this basename: CHOL, HDL, LDLCALC, TRIG, CHOLHDL, LDLDIRECT,  in the last 72 hours Thyroid Function Tests No results found for this basename: TSH, T4TOTAL, FREET3, T3FREE, THYROIDAB,  in the last 72 hours  TELE  Atrial fibrillation with slow ventricular response  ECG    Radiology/Studies  Dg Chest 2 View  10/12/2013   CLINICAL DATA:  Stroke.  EXAM: CHEST  2 VIEW  COMPARISON:  08/08/2012.  Abdomen CT dated 02/03/2012.  FINDINGS: Stable enlarged cardiac silhouette, median sternotomy wires and prosthetic heart valve. Curvilinear mitral valve annulus calcification on the left. Mildly prominent  pulmonary vasculature and interstitial markings. The lungs are mildly hyperexpanded and mild diffuse peribronchial thickening is noted. Interval small amount of patchy density at the left lateral lung base. Diffuse osteopenia.  IMPRESSION: 1. Small amount of patchy atelectasis or pneumonia at the left lateral lung base. 2. Cardiomegaly and changes of COPD and chronic bronchitis.   Electronically Signed   By: Enrique Sack M.D.   On: 10/12/2013 08:53   Ct Head Wo Contrast  10/10/2013   CLINICAL DATA:  Headache, hypertension  EXAM: CT HEAD WITHOUT CONTRAST  TECHNIQUE: Contiguous axial images were obtained from the base of the skull through the vertex without intravenous contrast.  COMPARISON:  MRI 07/23/2011  FINDINGS: Mild atrophy. Moderate chronic microvascular ischemic change throughout the white matter. Chronic left medial occipital infarct.  Negative for acute infarct, hemorrhage, or mass. Atherosclerotic disease is present. No focal bony abnormality.  IMPRESSION: Chronic ischemic change.  No acute abnormality.   Electronically Signed   By: Franchot Gallo M.D.   On: 10/10/2013 09:36   Mr Brain Wo Contrast  10/12/2013   CLINICAL DATA:  Stroke.  EXAM: MRI HEAD WITHOUT CONTRAST  MRA HEAD WITHOUT CONTRAST  TECHNIQUE: Multiplanar, multiecho pulse sequences of the brain and surrounding structures were obtained without intravenous contrast. Angiographic images of the head were obtained using MRA technique without contrast.  COMPARISON:  Head CT 10/10/2013.  MRI 07/23/2011.  FINDINGS: MRI HEAD FINDINGS  Diffusion imaging does not show any acute or subacute infarction. There are mild chronic small-vessel changes affecting the pons. No focal cerebellar insult. The cerebral hemispheres show an old cortical infarction in the left occipital lobe which has progressed to atrophy and encephalomalacia. There are confluent chronic small vessel ischemic changes throughout the cerebral hemispheric white matter. No mass lesion,  acute hemorrhage, hydrocephalus or extra-axial collection. Minimal hemosiderin is residual in the region of the left occipital infarction and in a few of the areas of chronic white matter infarction. No pituitary mass. No inflammatory sinus disease. No skull or skullbase lesion.  MRA HEAD FINDINGS  Both internal carotid arteries are widely patent into the brain. The anterior and middle cerebral vessels are normal without proximal stenosis, aneurysm or vascular malformation.  Both vertebral arteries are widely patent to the basilar. No basilar stenosis. Posterior circulation branch vessels are patent, but there are few root distal branch is seen in the left PCA than the right.  IMPRESSION: No acute infarction. Old left occipital infarction. Advanced chronic small vessel change of the cerebral hemispheric white matter.  No major vessel occlusion or correctable proximal stenosis. Few missing distal  PCA branches on the left.   Electronically Signed   By: Nelson Chimes M.D.   On: 10/12/2013 09:18   Dg Chest Port 1 View  10/18/2013   CLINICAL DATA:  Central chest pain since earlier today.  EXAM: PORTABLE CHEST - 1 VIEW  COMPARISON:  10/12/2013  FINDINGS: Postoperative changes in the mediastinum. Shallow inspiration. Borderline heart size is probably normal for degree of inspiration. No focal airspace disease or consolidation in the lungs. No blunting of costophrenic angles. Calcified and tortuous aorta.  IMPRESSION: No active disease.   Electronically Signed   By: Lucienne Capers M.D.   On: 10/18/2013 00:11   Mr [name] Head/brain Wo Cm  10/12/2013   CLINICAL DATA:  Stroke.  EXAM: MRI HEAD WITHOUT CONTRAST  MRA HEAD WITHOUT CONTRAST  TECHNIQUE: Multiplanar, multiecho pulse sequences of the brain and surrounding structures were obtained without intravenous contrast. Angiographic images of the head were obtained using MRA technique without contrast.  COMPARISON:  Head CT 10/10/2013.  MRI 07/23/2011.  FINDINGS: MRI HEAD  FINDINGS  Diffusion imaging does not show any acute or subacute infarction. There are mild chronic small-vessel changes affecting the pons. No focal cerebellar insult. The cerebral hemispheres show an old cortical infarction in the left occipital lobe which has progressed to atrophy and encephalomalacia. There are confluent chronic small vessel ischemic changes throughout the cerebral hemispheric white matter. No mass lesion, acute hemorrhage, hydrocephalus or extra-axial collection. Minimal hemosiderin is residual in the region of the left occipital infarction and in a few of the areas of chronic white matter infarction. No pituitary mass. No inflammatory sinus disease. No skull or skullbase lesion.  MRA HEAD FINDINGS  Both internal carotid arteries are widely patent into the brain. The anterior and middle cerebral vessels are normal without proximal stenosis, aneurysm or vascular malformation.  Both vertebral arteries are widely patent to the basilar. No basilar stenosis. Posterior circulation branch vessels are patent, but there are few root distal branch is seen in the left PCA than the right.  IMPRESSION: No acute infarction. Old left occipital infarction. Advanced chronic small vessel change of the cerebral hemispheric white matter.  No major vessel occlusion or correctable proximal stenosis. Few missing distal PCA branches on the left.   Electronically Signed   By: Nelson Chimes M.D.   On: 10/12/2013 09:18    ASSESSMENT AND PLAN 1.  Non-STEMI.  Plan is to send her home on Plavix and warfarin.  P2Y12 apparently not yet done. 2. chronic atrial fibrillation. 3. status post aortic valve replacement. 4. chronic diastolic heart failure 5. cough possibly secondary to lisinopril.  She has been switched to ARB  Plan: Okay for discharge home today if platelet inhibition test is satisfactory. Close followup in the office for warfarin and clinical followup with Dr. Acie Fredrickson.  Because of slow ventricular response  her discharge dose of atenolol to just 25 mg.  She is also on low-dose diltiazem.    Signed, Darlin Coco MD

## 2013-10-21 NOTE — Progress Notes (Signed)
After discharge teaching with pt, pt c/o numbness face, hands, and lips.  PA notified, no new orders given.

## 2013-10-23 ENCOUNTER — Telehealth: Payer: Self-pay | Admitting: Pharmacist

## 2013-10-23 ENCOUNTER — Ambulatory Visit (INDEPENDENT_AMBULATORY_CARE_PROVIDER_SITE_OTHER): Payer: Medicare Other | Admitting: *Deleted

## 2013-10-23 DIAGNOSIS — I4891 Unspecified atrial fibrillation: Secondary | ICD-10-CM | POA: Insufficient documentation

## 2013-10-23 DIAGNOSIS — I214 Non-ST elevation (NSTEMI) myocardial infarction: Secondary | ICD-10-CM

## 2013-10-23 DIAGNOSIS — I480 Paroxysmal atrial fibrillation: Secondary | ICD-10-CM

## 2013-10-23 LAB — POCT INR: INR: 3.1

## 2013-10-23 MED ORDER — LISINOPRIL 20 MG PO TABS: 20.0000 mg | ORAL_TABLET | Freq: Every day | ORAL | Status: DC

## 2013-10-23 NOTE — Patient Instructions (Signed)
Per Dr Rockney Ghee, Pharmacist, Stop Irbesartan ( AVAPRO) and restart Lisinopril 20mg s once a day.

## 2013-10-23 NOTE — Telephone Encounter (Signed)
Dr. Acie Fredrickson,  Saw patient in coumadin clinic this afternoon, and she was recently discharged following MI and DES.  She tells me that she felt her lips starting to swell and became numb in the hospital, and that the numbness has continued since she has been home.  She doesn't feel her lips are swollen anymore, and she denies any swelling of her tongue, or difficulty breathing.  Her lisinopril was changed to avapro 300 mg in the hospital.  She had no issue with swelling while on lisinopril in the past.  This was changed to avapro as she had a slight cough.  She tells me she no longer has a cough now.  She is also on Brilinta which can cause angioedema in a very small percent of patients.  Given she didn't have issues with swelling/numbness with lisinopril in the past, I have advised her to change back from Avapro 300 mg to lisinopril 20 mg qd.  The swelling has gone away, numbness is still present, and breathing hasn't been an issue.  Will continue Brilinta for now until she sees you in 2 weeks for f/u.  I advised her to let us know if the swelling returns or if the cough returns.  She will see [place] in 5 days for repeat INR so she will let us know if any improvement.  Ysidro Evert

## 2013-10-24 NOTE — Telephone Encounter (Signed)
Pt called clinic states she was previously on Lisinopril 40mg  QD before switching to Avapro.  Discussed with Alferd Apa, PharmD advised pt to resume same dosage of Lisinopril 40mg  daily that she was previously on.  Pt also inquired about needing to take 81mg  ASA with Brilinta. Advised pt she is on Warfarin and Brilinta, so she does not need to be on any additional ASA products.  Pt verbalized understanding.

## 2013-10-28 ENCOUNTER — Ambulatory Visit (INDEPENDENT_AMBULATORY_CARE_PROVIDER_SITE_OTHER): Payer: Medicare Other

## 2013-10-28 DIAGNOSIS — I480 Paroxysmal atrial fibrillation: Secondary | ICD-10-CM

## 2013-10-28 DIAGNOSIS — I214 Non-ST elevation (NSTEMI) myocardial infarction: Secondary | ICD-10-CM | POA: Diagnosis not present

## 2013-10-28 DIAGNOSIS — R3989 Other symptoms and signs involving the genitourinary system: Secondary | ICD-10-CM | POA: Diagnosis not present

## 2013-10-28 DIAGNOSIS — N3010 Interstitial cystitis (chronic) without hematuria: Secondary | ICD-10-CM | POA: Diagnosis not present

## 2013-10-28 DIAGNOSIS — N3946 Mixed incontinence: Secondary | ICD-10-CM | POA: Diagnosis not present

## 2013-10-28 DIAGNOSIS — I4891 Unspecified atrial fibrillation: Secondary | ICD-10-CM

## 2013-10-28 LAB — POCT INR: INR: 1.9

## 2013-10-30 ENCOUNTER — Telehealth: Payer: Self-pay | Admitting: Gastroenterology

## 2013-10-30 ENCOUNTER — Other Ambulatory Visit: Payer: Self-pay | Admitting: Gastroenterology

## 2013-10-30 DIAGNOSIS — I4891 Unspecified atrial fibrillation: Secondary | ICD-10-CM | POA: Diagnosis not present

## 2013-10-30 DIAGNOSIS — I214 Non-ST elevation (NSTEMI) myocardial infarction: Secondary | ICD-10-CM | POA: Diagnosis not present

## 2013-10-30 DIAGNOSIS — I110 Hypertensive heart disease with heart failure: Secondary | ICD-10-CM | POA: Diagnosis not present

## 2013-10-30 DIAGNOSIS — I5030 Unspecified diastolic (congestive) heart failure: Secondary | ICD-10-CM | POA: Diagnosis not present

## 2013-10-30 DIAGNOSIS — I2510 Atherosclerotic heart disease of native coronary artery without angina pectoris: Secondary | ICD-10-CM | POA: Diagnosis not present

## 2013-10-30 DIAGNOSIS — I509 Heart failure, unspecified: Secondary | ICD-10-CM | POA: Diagnosis not present

## 2013-10-30 DIAGNOSIS — L259 Unspecified contact dermatitis, unspecified cause: Secondary | ICD-10-CM | POA: Diagnosis not present

## 2013-10-30 DIAGNOSIS — I69993 Ataxia following unspecified cerebrovascular disease: Secondary | ICD-10-CM | POA: Diagnosis not present

## 2013-10-30 NOTE — Telephone Encounter (Signed)
Called Brittany [name]  They gave me the number to contact the patients insurance  [phone] for Omeprazole twice a day

## 2013-11-01 ENCOUNTER — Telehealth: Payer: Self-pay | Admitting: Cardiovascular Disease

## 2013-11-01 NOTE — Telephone Encounter (Signed)
Called spoke with pt, pt states she woke up with blood on sheets and mattress, got out of bed and went to bathroom ankle was gushing blood per pt report.  Pt's son wrapped ankle with a pressure bandage and bleeding has stopped.  Offered pt an appt today to check INR, pt declined appt.  Advised pt not to hold Coumadin without checking INR first.  INR on Monday 10/28/13 was 1.9, previous dosage 1/2 tablet daily increased dosage by 1/2 tablet weekly on Monday.  Advised pt if she holds Coumadin without known INR then she would be at risk of dropping INR too low and be at risk of clot or stroke, pt verbalized understanding.  Advised pt TCB if would like INR checked today, pt agreed.

## 2013-11-01 NOTE — Telephone Encounter (Signed)
New message     Pt is on coumadin.  She woke up this morning in a pool of blood.  It came from her ankle--there was a cut like scratch on her ankle.  Please advise

## 2013-11-04 ENCOUNTER — Ambulatory Visit (INDEPENDENT_AMBULATORY_CARE_PROVIDER_SITE_OTHER): Payer: Medicare Other

## 2013-11-04 DIAGNOSIS — I4891 Unspecified atrial fibrillation: Secondary | ICD-10-CM

## 2013-11-04 DIAGNOSIS — I214 Non-ST elevation (NSTEMI) myocardial infarction: Secondary | ICD-10-CM

## 2013-11-04 DIAGNOSIS — I480 Paroxysmal atrial fibrillation: Secondary | ICD-10-CM

## 2013-11-04 LAB — POCT INR: INR: 1.4

## 2013-11-05 DIAGNOSIS — M545 Low back pain, unspecified: Secondary | ICD-10-CM | POA: Diagnosis not present

## 2013-11-05 DIAGNOSIS — IMO0002 Reserved for concepts with insufficient information to code with codable children: Secondary | ICD-10-CM | POA: Diagnosis not present

## 2013-11-05 DIAGNOSIS — M47812 Spondylosis without myelopathy or radiculopathy, cervical region: Secondary | ICD-10-CM | POA: Diagnosis not present

## 2013-11-06 ENCOUNTER — Ambulatory Visit (INDEPENDENT_AMBULATORY_CARE_PROVIDER_SITE_OTHER): Payer: Medicare Other | Admitting: Cardiovascular Disease

## 2013-11-06 ENCOUNTER — Encounter: Payer: Self-pay | Admitting: Cardiovascular Disease

## 2013-11-06 VITALS — BP 142/78 | HR 70 | Ht 61.0 in | Wt 148.6 lb

## 2013-11-06 DIAGNOSIS — I214 Non-ST elevation (NSTEMI) myocardial infarction: Secondary | ICD-10-CM

## 2013-11-06 NOTE — Assessment & Plan Note (Signed)
This represents today for followup visit. She was originally admitted to the hospital in early July with diastolic congestive heart therapeutics was discharged but repositioned and the following day having had a non-ST segment elevation myocardial infarction. She had stenting of a severe fibrotic RCA lesion with a DES. Unfortunately, she she did not metabolize Plavix and was changed to brilinta.    She has been continued on Coumadin. We have held her aspirin.  She's had several episodes of bleeding-one was a nosebleed one was a skin tear that occurred in the middle of the night. She feels quite a bit better from a cardiac standpoint and is no longer having shortness of breath or chest pain.  At this point I think that she is overall doing fairly well. I think the best solution is to continue with the current dose of Brilinta and coumadin ( CHADS 2VASC score of 4) she'll need Brilinta  for at least 3 months. I'll see her in 3 months for followup visit. We'll discuss stopping the Brilinta at that time.  She will call us back for more problems.

## 2013-11-06 NOTE — Progress Notes (Signed)
[name] Date of Birth  October 25, 1927       [facility]    [employer] [address] [place] [address], [place], [place]  [address]   [place], [place]  [address] [phone]     [phone]   Fax  [phone]    Fax [phone]  Problem List: 1. Aortic valve replacement 2. Dyslipidemia 3. Hypertension 4. 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     5. Atrial fibrillation  History of Present Illness:  Ms. Labo is an 80 yo with aortic valve replacement, HTN, and hyperlipidemia.  She has not had any cardiac complaints recently.  She complains of easy bruising on her arms.    She had an episode of severe heart burn this past week.    She's had a stroke over the past year. This has left her with some balance issues and some loss of peripheral vision.  Nov. 4, 2014:  Ms. Lippold is doing well from a cardiac standpoint.  She is having problems with interstitial cystitis.  She had a procedure last week and had a small bladder tear which they are letting heal.  She is still having some significant pain.    Aug 27, 2013:  Ms. Flippen is doing OK.  She is not having any problems .  Still very active.   She had an AVR   She is having problems with interstitial cyctitis.    November 06, 2013:  Ms Scheper was admitted to the hospital twice on July , 2015: DC summary :  80 year old Caucasian female with past medical history significant for hypertension, hyperlipidemia, history of CVA permanent atrial fibrillation on Xarelto for the last 2 years, and history of aortic valve replacement with bioprosthetic valve in 2005, here with chest pain. She was recently admitted to the hospital for management of malignant HTN , diastolic HF and headache. She was seen by Dr Haroldine Laws and [place] on 10/16/2013. She returns within 24 hrs for symptoms of chest pain . Pt states that earlier in the afternoon while she was trying to sleep post lunch she started  experiencing soreness , heaviness "someone sitting on my chest" on the left side radiating to left arm with associated mild SOB. This improved with SL NTG , however, she continues to have some chest pain. Pt denied any orthopnea, PND , LE edema , syncope, claudication , palpitation etc . She reports medication compliance. She bruises easily in her skin and once episodes of epistaxis several months ago.  She was admitted. Xarelto was held in anticipation of coronary angiography. ASA, statin, BB, IV NTG and heparin added. She ruled in for NSTEMI with troponin of 0.63. Left heart cath revealed severe, fibrotic disease in the ostial right coronary artery which was the culprit lesion. This was successfully stented with a 2.5 x 14 resolute drug-eluting stent, postdilated to 3.3 mm in diameter. She was started on plavix, however, a P2Y12 came back at 289 so she was started on Brilinta. Cardizem cd 120 was added for afib RVR with improvement. Due to mild cough Lisinopril was changed to irbesartan. Xarelto was changed to coumadin and she will follow up two days after DC in the coumadin clinic   She now presents for follow up .  She has some MSK pain ( hurts when she presses her chest.   She has had some bleeding.   She woke up in the morning with a  small skin tear on her right ankle .  There was lots of blood in the bed.    The bleeding eventually stopped with pressure.    She also has had a nose bleed.   She has not had any chest pain and her energy levels have been better. INR levels have been OK.     Current Outpatient Prescriptions on File Prior to Visit  Medication Sig Dispense Refill  . atenolol (TENORMIN) 25 MG tablet Take 1 tablet (25 mg total) by mouth daily.  30 tablet  5  . atorvastatin (LIPITOR) 20 MG tablet Take 20 mg by mouth daily.       . butalbital-acetaminophen-caffeine (FIORICET) 50-325-40 MG per tablet Take 1 tablet by mouth every 6 (six) hours as needed for headache.  10 tablet  0  .  cholecalciferol (VITAMIN D) 1000 UNITS tablet Take 1,000 Units by mouth daily.      Marland Kitchen diltiazem (CARDIZEM LA) 120 MG 24 hr tablet Take 1 tablet (120 mg total) by mouth daily.  30 tablet  5  . lisinopril (PRINIVIL,ZESTRIL) 20 MG tablet Take 1 tablet (20 mg total) by mouth daily.  90 tablet  3  . Multiple Vitamins-Minerals (MULTIVITAMIN WITH MINERALS) tablet Take 1 tablet by mouth daily.      . Omega-3 Fatty Acids (FISH OIL PO) Take 1 g by mouth daily.       Marland Kitchen omeprazole (PRILOSEC) 20 MG capsule Take 20 mg by mouth 2 (two) times daily before a meal.      . omeprazole (PRILOSEC) 20 MG capsule TAKE ONE CAPSULE BY MOUTH TWICE DAILY  60 capsule  2  . oxybutynin (DITROPAN) 5 MG tablet Take 5 mg by mouth 3 (three) times daily.      . pantoprazole (PROTONIX) 40 MG tablet Take 40 mg by mouth daily.      . ticagrelor (BRILINTA) 90 MG TABS tablet Take 1 tablet (90 mg total) by mouth 2 (two) times daily.  60 tablet  10  . warfarin (COUMADIN) 5 MG tablet Take 1.5 tablets (7.5 mg total) by mouth daily at 6 PM.  30 tablet  5   No current facility-administered medications on file prior to visit.    Allergies  Allergen Reactions  . Amoxicillin Diarrhea and Nausea And Vomiting  . Cephalexin Diarrhea  . Codeine Nausea Only  . Lisinopril Cough  . Morphine And Related Other (See Comments)    HX ADDICTION / WITHDRAWAL  . Neurontin [Gabapentin] Swelling    Legs swell  . Nitrofurantoin Monohyd Macro Diarrhea and Nausea Only  . Prednisone Nausea Only  . Sulfa Antibiotics Rash    Past Medical History  Diagnosis Date  . Dyslipidemia     takes Lipitor daily  . Arthritis   . Urine incontinence   . Diverticulosis   . IC (interstitial cystitis)   . S/P aortic valve replacement     2005  . H/O hiatal hernia   . History of TIA (transient ischemic attack)     2013-- RESIDUAL PERIPHERAL VISION RIGHT EYE--  RESOLVED  . History of bacterial endocarditis   . Foot drop     SINCE 1987  . History of gastritis     . Chronic back pain   . Hypertension   . Atrial fibrillation   . GERD (gastroesophageal reflux disease)     Past Surgical History  Procedure Laterality Date  . Abdominal hysterectomy  1967  . Aortic valve replacement  09-29-2003  [place] pericardial tissue valve  . Cholecystectomy  1993  . Vein ligation  1969    RIGHT LOWER LEG  . Dilation and curettage of uterus    . Ercp N/A 08/10/2012    Procedure: ENDOSCOPIC RETROGRADE CHOLANGIOPANCREATOGRAPHY (ERCP);  Surgeon: Inda Castle, MD;  Location: [facility];  Service: Gastroenterology;  Laterality: N/A;  . Cataract extraction w/ intraocular lens  implant, bilateral    . Appendectomy  1933  . Cardiac catheterization  07-15-2003 DR HELEN PRESTON    MODERATE TO MODERATELY SEVERE CALCIFIC AORTIC STENOSIS/  NORMAL CORONARY ARTERIES  . Right shoulder arthroscopy w/ debridement rotator cuff and labral tear/ acrominoplasty/ distal clavicle excision/ ca ligament release  10-08-1999  . Cysto/ hydrodistention/ instillation clorpactin  MULTIPLE  last one 2009  . Mini-open right rotator cuff repair  07-12-2001  . Transvaginal tape procedure  07-30-2002  . Lumbar disc surgery  1985 &  2007  . Macroplastique urethral implantation  08-27-2009  . Transthoracic echocardiogram  08-10-2011    MILD LVH/  EF 55-60%/  NORMAL AVR TISSUE/ MILD MR/  MODERATE DILATED LA/  MILD DILATED RA  . Cardiovascular stress test  01-30-2012  DR NASHER    NORMAL NUCLEAR STUDY/  EF 73%/  NORMAL LVF  . Tonsillectomy and adenoidectomy    . Carpal tunnel release Bilateral   . Cysto with hydrodistension N/A 02/05/2013    Procedure: CYSTOSCOPY/HYDRODISTENSION;  Surgeon: Irine Seal, MD;  Location: [facility];  Service: Urology;  Laterality: N/A;    History  Smoking status  . Former Smoker  . Types: Cigarettes  . Quit date: 03/06/1990  Smokeless tobacco  . Never Used    History  Alcohol Use No    Family History  Problem Relation Age of  Onset  . Heart disease Father   . Coronary artery disease Brother   . Bipolar disorder Brother     commited suiside at 87yo  . Alcohol abuse Sister     sister #1  . Alcohol abuse Sister     sister #2  . Colon cancer Maternal Aunt   . Stomach cancer Maternal Grandmother     Reviw of Systems:  Reviewed in the HPI.  All other systems are negative.  Physical Exam: Blood pressure 142/78, pulse 70, height 5\' 1"  (1.549 m), weight 148 lb 9.6 oz (67.405 kg). General: Well developed, well nourished, in no acute distress.  Head: Normocephalic, atraumatic, sclera non-icteric, mucus membranes are moist,   Neck: Supple. Carotids are 2 + without bruits. No JVD  Lungs: Clear bilaterally to auscultation.  Heart: irregularly irregular.  normal  S1 S2. No murmurs, gallops or rubs.  Abdomen: Soft, non-tender, non-distended with normal bowel sounds. No hepatomegaly. No rebound/guarding. No masses.  Msk:  Strength and tone are normal  Extremities: No clubbing or cyanosis. No edema.  Distal pedal pulses are 2+ and equal bilaterally.  Neuro: Alert and oriented X 3. Moves all extremities spontaneously.  Psych:  Responds to questions appropriately with a normal affect.  ECG: 11/07/11 - Atrial fibrillation with controlled rate.  Assessment / Plan:

## 2013-11-06 NOTE — Patient Instructions (Signed)
Your physician recommends that you continue on your current medications as directed. Please refer to the Current Medication list given to you today.  Your physician recommends that you schedule a follow-up appointment in: 3 months with Dr. Acie Fredrickson - October 29

## 2013-11-07 ENCOUNTER — Encounter (HOSPITAL_COMMUNITY)
Admission: RE | Admit: 2013-11-07 | Discharge: 2013-11-07 | Disposition: A | Discharge status: Home / Self Care | Payer: Medicare Other | Source: Ambulatory Visit | Attending: Cardiovascular Disease | Admitting: Cardiovascular Disease

## 2013-11-07 ENCOUNTER — Telehealth: Payer: Self-pay | Admitting: Cardiovascular Disease

## 2013-11-07 NOTE — Progress Notes (Signed)
Cardiac Rehab Medication Review by a Pharmacist  Does the patient  feel that his/her medications are working for him/her?  yes  Has the patient been experiencing any side effects to the medications prescribed?  Yes - used to have cough with lisinopril but she was recently restarted on it and has not had any issues. She was taking a BP medication in the hospital (potentially irbesartan but she doesn't remember) and experienced face and hand numbing/swelling. She is tolerating her lisinopril currently with no issues. She has also had multiple bleeding events while on Brilinta and warfarin (see comments below).  Does the patient measure his/her own blood pressure or blood glucose at home?  yes   Does the patient have any problems obtaining medications due to transportation or finances?   no  Understanding of regimen: excellent Understanding of indications: excellent Potential of compliance: excellent    Pharmacist comments: [name] who presents today for medication review. She brought a list of her home medications with her, and has a very good understanding of each of her medications and what she is taking them for. She has recently restarted taking lisinopril (used to have a cough when taking it) and is tolerating it well. She had facial numbness and swelling with a BP medication in the hospital; she believes it was irbesartan but is not sure. She has also had an issue with bleeding while taking her Brilinta and warfarin. Last week, she had a small cut on her ankle that bled profusely and would not stop. She also had a nose bleed last week. She called her doctor to let him know, and he explained that she still needs to stay on her anticoagulants. I recommended that she continue to monitor closely for any more bleeding and to let her doctor know if this continues. Of note, she is also taking both omeprazole and pantoprazole daily for GERD. Patient shows compliance and great understanding  of all of her medications.   Brittany [name], Pharm.D Clinical Pharmacy Resident Pager: [phone] 11/07/2013 8:34 AM

## 2013-11-07 NOTE — Telephone Encounter (Signed)
New message     Pt had a heart attack on 7-9 and stent on 7-10---what are her restrictions (if any)?

## 2013-11-07 NOTE — Telephone Encounter (Signed)
Called stating she forgot to ask when seen yesterday if she had any restrictions from having the stent.  Advised the first week after stent placement we observe any bleeding and activities should be no lifting.  Since this was done the first of July there would be no restrictions at this time.

## 2013-11-11 ENCOUNTER — Encounter (HOSPITAL_COMMUNITY): Payer: Self-pay

## 2013-11-11 ENCOUNTER — Encounter (HOSPITAL_COMMUNITY)
Admission: RE | Admit: 2013-11-11 | Discharge: 2013-11-11 | Disposition: A | Discharge status: Home / Self Care | Payer: Medicare Other | Source: Ambulatory Visit | Attending: Cardiovascular Disease | Admitting: Cardiovascular Disease

## 2013-11-11 ENCOUNTER — Ambulatory Visit (INDEPENDENT_AMBULATORY_CARE_PROVIDER_SITE_OTHER): Payer: Medicare Other | Admitting: Pharmacist

## 2013-11-11 DIAGNOSIS — I480 Paroxysmal atrial fibrillation: Secondary | ICD-10-CM

## 2013-11-11 DIAGNOSIS — Z5189 Encounter for other specified aftercare: Secondary | ICD-10-CM | POA: Insufficient documentation

## 2013-11-11 DIAGNOSIS — I214 Non-ST elevation (NSTEMI) myocardial infarction: Secondary | ICD-10-CM | POA: Diagnosis not present

## 2013-11-11 DIAGNOSIS — I252 Old myocardial infarction: Secondary | ICD-10-CM | POA: Insufficient documentation

## 2013-11-11 DIAGNOSIS — I4891 Unspecified atrial fibrillation: Secondary | ICD-10-CM | POA: Diagnosis not present

## 2013-11-11 DIAGNOSIS — I5032 Chronic diastolic (congestive) heart failure: Secondary | ICD-10-CM | POA: Diagnosis not present

## 2013-11-11 DIAGNOSIS — Z954 Presence of other heart-valve replacement: Secondary | ICD-10-CM | POA: Insufficient documentation

## 2013-11-11 LAB — POCT INR: INR: 1.8

## 2013-11-11 NOTE — Progress Notes (Addendum)
Pt started cardiac rehab today.  Pt tolerated light exercise without difficulty.  VSS,telemetry-atrial fibrillation.  Asymptomatic.  PHQ-1.  Pt reports death of son and husband within past 2 years.  This has been very difficult for her.  Offer she now exhibits a normal grief pattern.  She has supportive family and church.   Pt lives with her son and dtr in law. Pt enjoys cooking, reading, and church activities.  Pt walks in stores for exercise.  Pt would like to increase her strength and stamina as a rehab goal. Pt reports she was very physically inactive for 1 year prior to her heart surgery due to mourning.  Pt offered emotional support and reassurance.  Pt oriented to exercise equipment and routine.  Understanding verbalized.

## 2013-11-13 ENCOUNTER — Encounter (HOSPITAL_COMMUNITY)
Admission: RE | Admit: 2013-11-13 | Discharge: 2013-11-13 | Disposition: A | Discharge status: Home / Self Care | Payer: Medicare Other | Source: Ambulatory Visit | Attending: Cardiovascular Disease | Admitting: Cardiovascular Disease

## 2013-11-13 DIAGNOSIS — Z5189 Encounter for other specified aftercare: Secondary | ICD-10-CM | POA: Diagnosis not present

## 2013-11-14 DIAGNOSIS — D485 Neoplasm of uncertain behavior of skin: Secondary | ICD-10-CM | POA: Diagnosis not present

## 2013-11-15 ENCOUNTER — Encounter (HOSPITAL_COMMUNITY): Admission: RE | Admit: 2013-11-15 | Payer: Medicare Other | Source: Ambulatory Visit

## 2013-11-18 ENCOUNTER — Other Ambulatory Visit (HOSPITAL_COMMUNITY): Payer: Self-pay | Admitting: Physician Assistant

## 2013-11-18 ENCOUNTER — Encounter (HOSPITAL_COMMUNITY)
Admission: RE | Admit: 2013-11-18 | Discharge: 2013-11-18 | Disposition: A | Discharge status: Home / Self Care | Payer: Medicare Other | Source: Ambulatory Visit | Attending: Cardiovascular Disease | Admitting: Cardiovascular Disease

## 2013-11-18 ENCOUNTER — Other Ambulatory Visit: Payer: Self-pay | Admitting: Gastroenterology

## 2013-11-18 DIAGNOSIS — Z5189 Encounter for other specified aftercare: Secondary | ICD-10-CM | POA: Diagnosis not present

## 2013-11-20 ENCOUNTER — Encounter (HOSPITAL_COMMUNITY)
Admission: RE | Admit: 2013-11-20 | Discharge: 2013-11-20 | Disposition: A | Discharge status: Home / Self Care | Payer: Medicare Other | Source: Ambulatory Visit | Attending: Cardiovascular Disease | Admitting: Cardiovascular Disease

## 2013-11-20 DIAGNOSIS — Z5189 Encounter for other specified aftercare: Secondary | ICD-10-CM | POA: Diagnosis not present

## 2013-11-20 NOTE — Progress Notes (Signed)
Brittany [name] mentioned experiencing some soreness is her left shoulder in the socket with movement . Brittany [name] said she noticed the soreness when she woke up this morning. Brittany [name] said she did some house work and mopping yesterday.  Brittany [name] exercised without moving her left arm to on the bike and nustep and had no problems. Brittany [name] said she will follow up with her primary care provider if her arm continues to bother her.

## 2013-11-22 ENCOUNTER — Ambulatory Visit (INDEPENDENT_AMBULATORY_CARE_PROVIDER_SITE_OTHER): Payer: Medicare Other | Admitting: Pharmacist

## 2013-11-22 ENCOUNTER — Encounter (HOSPITAL_COMMUNITY)
Admission: RE | Admit: 2013-11-22 | Discharge: 2013-11-22 | Disposition: A | Discharge status: Home / Self Care | Payer: Medicare Other | Source: Ambulatory Visit | Attending: Cardiovascular Disease | Admitting: Cardiovascular Disease

## 2013-11-22 DIAGNOSIS — I4891 Unspecified atrial fibrillation: Secondary | ICD-10-CM

## 2013-11-22 DIAGNOSIS — I214 Non-ST elevation (NSTEMI) myocardial infarction: Secondary | ICD-10-CM

## 2013-11-22 DIAGNOSIS — Z5189 Encounter for other specified aftercare: Secondary | ICD-10-CM | POA: Diagnosis not present

## 2013-11-22 DIAGNOSIS — I480 Paroxysmal atrial fibrillation: Secondary | ICD-10-CM

## 2013-11-22 LAB — POCT INR: INR: 2.9

## 2013-11-22 NOTE — Progress Notes (Signed)
Brittany [name] returned to exercise today without complaints of left shoulder discomfort. Evalisse left early today with complaints of stomach cramping from sore "spicy cabbage she ate."  Exit vital signs within normal limits

## 2013-11-25 ENCOUNTER — Encounter (HOSPITAL_COMMUNITY)
Admission: RE | Admit: 2013-11-25 | Discharge: 2013-11-25 | Disposition: A | Discharge status: Home / Self Care | Payer: Medicare Other | Source: Ambulatory Visit | Attending: Cardiovascular Disease | Admitting: Cardiovascular Disease

## 2013-11-25 DIAGNOSIS — Z5189 Encounter for other specified aftercare: Secondary | ICD-10-CM | POA: Diagnosis not present

## 2013-11-27 ENCOUNTER — Telehealth (HOSPITAL_COMMUNITY): Payer: Self-pay | Admitting: Family Medicine

## 2013-11-27 ENCOUNTER — Encounter (HOSPITAL_COMMUNITY): Payer: Medicare Other

## 2013-11-29 ENCOUNTER — Encounter (HOSPITAL_COMMUNITY)
Admission: RE | Admit: 2013-11-29 | Discharge: 2013-11-29 | Disposition: A | Discharge status: Home / Self Care | Payer: Medicare Other | Source: Ambulatory Visit | Attending: Cardiovascular Disease | Admitting: Cardiovascular Disease

## 2013-11-29 DIAGNOSIS — Z5189 Encounter for other specified aftercare: Secondary | ICD-10-CM | POA: Diagnosis not present

## 2013-11-29 NOTE — Progress Notes (Signed)
Reviewed home exercise with pt today.  Pt plans to walk at home and in stores for exercise.  Reviewed THR, pulse, RPE, sign and symptoms, and when to call 911 or MD.  Pt voiced understanding. Alberteen Sam, MA, ACSM RCEP

## 2013-12-02 ENCOUNTER — Encounter (HOSPITAL_COMMUNITY)
Admission: RE | Admit: 2013-12-02 | Discharge: 2013-12-02 | Disposition: A | Discharge status: Home / Self Care | Payer: Medicare Other | Source: Ambulatory Visit | Attending: Cardiovascular Disease | Admitting: Cardiovascular Disease

## 2013-12-02 DIAGNOSIS — Z5189 Encounter for other specified aftercare: Secondary | ICD-10-CM | POA: Diagnosis not present

## 2013-12-04 ENCOUNTER — Encounter (HOSPITAL_COMMUNITY)
Admission: RE | Admit: 2013-12-04 | Discharge: 2013-12-04 | Disposition: A | Discharge status: Home / Self Care | Payer: Medicare Other | Source: Ambulatory Visit | Attending: Cardiovascular Disease | Admitting: Cardiovascular Disease

## 2013-12-04 DIAGNOSIS — Z5189 Encounter for other specified aftercare: Secondary | ICD-10-CM | POA: Diagnosis not present

## 2013-12-05 ENCOUNTER — Telehealth: Payer: Self-pay | Admitting: Cardiovascular Disease

## 2013-12-05 NOTE — Telephone Encounter (Signed)
Advised patient, verbalized understanding  

## 2013-12-05 NOTE — Telephone Encounter (Signed)
New message     Pt had heart attack/stent in July.  She takes cardiac rehab m/w/f.  She is not having problems with her bp-----161/98 then 157/98 then 138/87 all this morning.  Most recent 132/85 at 10:52am.  Pt says she has a headache and do not feel good.  Please advise.

## 2013-12-05 NOTE — Telephone Encounter (Signed)
She should talk lasix for 2-3 more days and see if that helps her breathing.  Call us back with report on Monday.  If she has potassium, she should take that also when she is taking the lasix ( Kdur 10 meq a day)

## 2013-12-05 NOTE — Telephone Encounter (Signed)
Took a lasix this morning for swelling, tightness in her abdomen, and weight gain (less than 2-3 pounds). Increased urinary output and blood pressure down to 113/80 now. This morning when her blood pressure was 161/98 and 157/98 it was prior to her medications. Patient stated that yesterday her systolic blood pressure was 140's when she went to cardiac rehab, better when she left. Patient states these readings are high for her. She has been having some shortness of breath on exertion.  Patient has been having nausea since starting Brilinta. Stated she was feeling some better since blood pressure down. Will forward to Dr Acie Fredrickson for review.

## 2013-12-06 ENCOUNTER — Telehealth: Payer: Self-pay | Admitting: Cardiology

## 2013-12-06 ENCOUNTER — Ambulatory Visit (INDEPENDENT_AMBULATORY_CARE_PROVIDER_SITE_OTHER): Payer: Medicare Other | Admitting: Pharmacist

## 2013-12-06 ENCOUNTER — Encounter (HOSPITAL_COMMUNITY)
Admission: RE | Admit: 2013-12-06 | Discharge: 2013-12-06 | Disposition: A | Discharge status: Home / Self Care | Payer: Medicare Other | Source: Ambulatory Visit | Attending: Cardiovascular Disease | Admitting: Cardiovascular Disease

## 2013-12-06 DIAGNOSIS — I4891 Unspecified atrial fibrillation: Secondary | ICD-10-CM

## 2013-12-06 DIAGNOSIS — I214 Non-ST elevation (NSTEMI) myocardial infarction: Secondary | ICD-10-CM

## 2013-12-06 DIAGNOSIS — I480 Paroxysmal atrial fibrillation: Secondary | ICD-10-CM

## 2013-12-06 LAB — POCT INR: INR: 2.7

## 2013-12-06 NOTE — Progress Notes (Signed)
Exit blood pressure 108/70. Brittany [name] was symptom free upon exit from cardiac rehab. Will continue to monitor the patient throughout  the program.

## 2013-12-06 NOTE — Telephone Encounter (Signed)
Go back to prn Lasix, wgt down 3kg, B/P soft. Maria from Cardiac rehab called.  Kerin Ransom PA-C 12/06/2013 3:25 PM

## 2013-12-06 NOTE — Progress Notes (Signed)
Patient reported feeling nauseated this afternoon,  Blood pressure 116/62 sitting, standing blood pressure 98/60.  Graceann's weight is 64.6 kg which is down 1.7 kg from Wednesday. Patient given Gatorade. Repeat blood pressure 120/70 sitting and 108/60. Shakeisha has been taking some extra Lasix and potassium for complaints of shortness of breath per Dr Acie Fredrickson.  Kerin Ransom [facility] called and notified of patients complaints. Lurena Joiner told Mrs Osterberg to stop taking the Lasix and Potassium. Patient denies chest pain or feeling lightheaded. Patient's oxygen saturation 99% on room air.  Kamayah has only eaten a bowl of cereal and two yogurts today.  Patient was also given saltine crackers and a banana.

## 2013-12-09 ENCOUNTER — Encounter (HOSPITAL_COMMUNITY): Payer: Medicare Other

## 2013-12-09 ENCOUNTER — Telehealth: Payer: Self-pay | Admitting: Cardiovascular Disease

## 2013-12-09 DIAGNOSIS — R0602 Shortness of breath: Secondary | ICD-10-CM

## 2013-12-09 DIAGNOSIS — I10 Essential (primary) hypertension: Secondary | ICD-10-CM

## 2013-12-09 DIAGNOSIS — Z5181 Encounter for therapeutic drug level monitoring: Secondary | ICD-10-CM

## 2013-12-09 NOTE — Telephone Encounter (Signed)
Pt states that the past Friday pt was in cardiac rehab she experience SOB, Nauseas. Pt's Lasix was D/C per Dr Acie Fredrickson than. Pt states that during this past weekend she has been  SOB with any activity. Pt  has no SOB when sitting. her  BP was bellow 480 systolic in one instance her BP was  99/50. Pt is not taking Lasix now. Pt states she had a  heart attact, and had stent this past July 11 th  . Pt is on Brillinta and cartia  are new medications added to her when she was in the hospital. Pt  States that the   Headache, nausea and  SOB with any activity may be cause by Brillinta. This AM BP has been 128/81 at  9:46 148/91. Pt took all her medication this AM. BP now is 130/73. Pt took all her medications this AM. Pt is aware that this message will be send to Md for recommendations.

## 2013-12-09 NOTE — Telephone Encounter (Signed)
New Message  Pt called states that she is experiencing a lot of nausea and SOB with any type of exertion. (Patient reports she  is currently sitting, Not experiencing SOB). Pt requests a call back to determine if it has anything to do with her Medications (Brilanta) Please call back to discuss.

## 2013-12-09 NOTE — Telephone Encounter (Signed)
Dr. Harrington Challenger DOD aware she recommends for pt to continue taking the Brillinta as scheduled, pt to get BMP and BNP tomorrow. DOD also recommended for pt to start Lasix. Pt aware, she states that she had the episode in cardiac rehab last Friday, and she is to take Lasix PRN for  increased weight and her weight is still down . DOD recommend to send this message to Dr. Acie Fredrickson for recommendations.  Pt is aware to come tomorrow for labs and continue Brillinta as scheduled . Pt states she is feeling better this PM.

## 2013-12-10 ENCOUNTER — Other Ambulatory Visit (INDEPENDENT_AMBULATORY_CARE_PROVIDER_SITE_OTHER): Payer: Medicare Other

## 2013-12-10 ENCOUNTER — Telehealth: Payer: Self-pay | Admitting: Cardiovascular Disease

## 2013-12-10 DIAGNOSIS — I10 Essential (primary) hypertension: Secondary | ICD-10-CM

## 2013-12-10 DIAGNOSIS — R0602 Shortness of breath: Secondary | ICD-10-CM | POA: Diagnosis not present

## 2013-12-10 DIAGNOSIS — I4891 Unspecified atrial fibrillation: Secondary | ICD-10-CM | POA: Diagnosis not present

## 2013-12-10 DIAGNOSIS — I480 Paroxysmal atrial fibrillation: Secondary | ICD-10-CM

## 2013-12-10 DIAGNOSIS — I214 Non-ST elevation (NSTEMI) myocardial infarction: Secondary | ICD-10-CM | POA: Diagnosis not present

## 2013-12-10 LAB — BASIC METABOLIC PANEL
BUN: 15 mg/dL (ref 6–23)
CO2: 28 meq/L (ref 19–32)
Calcium: 8.8 mg/dL (ref 8.4–10.5)
Chloride: 101 mEq/L (ref 96–112)
Creatinine, Ser: 0.7 mg/dL (ref 0.4–1.2)
GFR: 92.16 mL/min (ref >60.00)
Glucose, Bld: 88 mg/dL (ref 70–99)
Potassium: 4.3 mEq/L (ref 3.5–5.1)
SODIUM: 134 meq/L — AB (ref 135–145)

## 2013-12-10 LAB — BRAIN NATRIURETIC PEPTIDE: Pro B Natriuretic peptide (BNP): 339 pg/mL — ABNORMAL HIGH (ref 0.0–100.0)

## 2013-12-10 MED ORDER — CLOPIDOGREL BISULFATE 75 MG PO TABS: 75.0000 mg | ORAL_TABLET | Freq: Every day | ORAL | Status: DC

## 2013-12-10 NOTE — Telephone Encounter (Signed)
Spoke with pt and gave her instructions from Dr. Acie Fredrickson. She is taking both Protonix and Prilosec. She will stop Prilosec and continue Protonix. Will send prescription for Plavix to Kristopher Oppenheim at [facility]. She will stop Brilinta after tonight's dose and start Plavix once daily tomorrow.

## 2013-12-10 NOTE — Telephone Encounter (Signed)
I have talked with Brittany [name], RPharm. Brittany [name] had a  DES in July - on Plavix  and a P2Y12 was elevated indicating insufficient platelet inhibition. She was changed to Brilinta at the time. She is now having dyspnea - likely related to the [place]. In reviewing the notes, Brittany [name] was also on Omeprazole at the time that she was on Plavix  Since she is not tolerating the Brilinta, we will change her to Plavix, Recheck the P2Y12 in several weeks.

## 2013-12-10 NOTE — Telephone Encounter (Signed)
Spoke with pt who reports she is continuing to be short of breath with any activity. Started last Friday.  Very short of breath when walking from parking deck to office today for lab work. Short of breath when walking through house. No shortness of breath at rest. Blood pressure today 120/70. Weight yesterday and today--143lbs.  She has not taken prn Lasix because her blood pressure is not up and no weight gain.  I instructed her to go ahead and take prn Lasix dose at this time and I would send message to Dr. Acie Fredrickson for additional recommendations.  She is taking Brilinta but reports nausea and shortness of breath about an hour after taking. Lab results not available yet.

## 2013-12-10 NOTE — Telephone Encounter (Signed)
It sounds like the Brilinta may be causing her dyspnea.  She is on warfarin and plavix may actually be a better long term choice for her ( perhaps lower bleeding risk)  DC Brilinta Start plavix 75 mg a day This should help the shortness of breath

## 2013-12-10 NOTE — Telephone Encounter (Signed)
F/u   Pt still having sob.

## 2013-12-10 NOTE — Telephone Encounter (Signed)
Continue PRN lasix as directed.

## 2013-12-11 ENCOUNTER — Encounter (HOSPITAL_COMMUNITY): Admission: RE | Admit: 2013-12-11 | Payer: Medicare Other | Source: Ambulatory Visit

## 2013-12-11 NOTE — Telephone Encounter (Signed)
Spoke with patient who states she took her first Plavix this morning and understands instructions given to her by Enis Slipper, RN yesterday.  I advised patient of P2y12 needed in a few weeks and patient agreed to get this test on 9/21 when she is at [facility] for cardiac rehab.  I advised patient to pick up written Rx on 9/18 when she comes to [facility] for pt/inr.  Patient verbalized understanding and agreement and I advised her to call back with questions or concerns or worsening symptoms.

## 2013-12-13 ENCOUNTER — Encounter (HOSPITAL_COMMUNITY)
Admission: RE | Admit: 2013-12-13 | Discharge: 2013-12-13 | Disposition: A | Discharge status: Home / Self Care | Payer: Medicare Other | Source: Ambulatory Visit | Attending: Cardiovascular Disease | Admitting: Cardiovascular Disease

## 2013-12-13 DIAGNOSIS — I252 Old myocardial infarction: Secondary | ICD-10-CM | POA: Insufficient documentation

## 2013-12-13 DIAGNOSIS — I5032 Chronic diastolic (congestive) heart failure: Secondary | ICD-10-CM | POA: Insufficient documentation

## 2013-12-13 DIAGNOSIS — Z5189 Encounter for other specified aftercare: Secondary | ICD-10-CM | POA: Diagnosis not present

## 2013-12-13 DIAGNOSIS — I4891 Unspecified atrial fibrillation: Secondary | ICD-10-CM | POA: Diagnosis not present

## 2013-12-13 DIAGNOSIS — Z954 Presence of other heart-valve replacement: Secondary | ICD-10-CM | POA: Insufficient documentation

## 2013-12-18 ENCOUNTER — Encounter (HOSPITAL_COMMUNITY): Payer: Medicare Other

## 2013-12-19 ENCOUNTER — Other Ambulatory Visit: Payer: Self-pay | Admitting: Gastroenterology

## 2013-12-20 ENCOUNTER — Encounter (HOSPITAL_COMMUNITY): Payer: Medicare Other

## 2013-12-23 ENCOUNTER — Encounter (HOSPITAL_COMMUNITY)
Admission: RE | Admit: 2013-12-23 | Discharge: 2013-12-23 | Disposition: A | Discharge status: Home / Self Care | Payer: Medicare Other | Source: Ambulatory Visit | Attending: Cardiovascular Disease | Admitting: Cardiovascular Disease

## 2013-12-23 DIAGNOSIS — Z5189 Encounter for other specified aftercare: Secondary | ICD-10-CM | POA: Diagnosis not present

## 2013-12-25 ENCOUNTER — Encounter (HOSPITAL_COMMUNITY)
Admission: RE | Admit: 2013-12-25 | Discharge: 2013-12-25 | Disposition: A | Discharge status: Home / Self Care | Payer: Medicare Other | Source: Ambulatory Visit | Attending: Cardiovascular Disease | Admitting: Cardiovascular Disease

## 2013-12-25 DIAGNOSIS — Z5189 Encounter for other specified aftercare: Secondary | ICD-10-CM | POA: Diagnosis not present

## 2013-12-26 NOTE — Telephone Encounter (Signed)
Erroneous

## 2013-12-27 ENCOUNTER — Ambulatory Visit (INDEPENDENT_AMBULATORY_CARE_PROVIDER_SITE_OTHER): Payer: Medicare Other | Admitting: *Deleted

## 2013-12-27 ENCOUNTER — Encounter (HOSPITAL_COMMUNITY)
Admission: RE | Admit: 2013-12-27 | Discharge: 2013-12-27 | Disposition: A | Discharge status: Home / Self Care | Payer: Medicare Other | Source: Ambulatory Visit | Attending: Cardiovascular Disease | Admitting: Cardiovascular Disease

## 2013-12-27 DIAGNOSIS — I214 Non-ST elevation (NSTEMI) myocardial infarction: Secondary | ICD-10-CM

## 2013-12-27 DIAGNOSIS — Z5189 Encounter for other specified aftercare: Secondary | ICD-10-CM | POA: Diagnosis not present

## 2013-12-27 LAB — POCT INR: INR: 2.4

## 2013-12-30 ENCOUNTER — Ambulatory Visit (HOSPITAL_COMMUNITY)
Admission: RE | Admit: 2013-12-30 | Discharge: 2013-12-30 | Disposition: A | Discharge status: Home / Self Care | Payer: Medicare Other | Source: Ambulatory Visit | Attending: Cardiovascular Disease | Admitting: Cardiovascular Disease

## 2013-12-30 ENCOUNTER — Encounter (HOSPITAL_COMMUNITY)
Admission: RE | Admit: 2013-12-30 | Discharge: 2013-12-30 | Disposition: A | Discharge status: Home / Self Care | Payer: Medicare Other | Source: Ambulatory Visit | Attending: Cardiovascular Disease | Admitting: Cardiovascular Disease

## 2013-12-30 DIAGNOSIS — Z48812 Encounter for surgical aftercare following surgery on the circulatory system: Secondary | ICD-10-CM | POA: Diagnosis not present

## 2013-12-30 DIAGNOSIS — Z954 Presence of other heart-valve replacement: Secondary | ICD-10-CM | POA: Diagnosis not present

## 2013-12-30 LAB — PLATELET INHIBITION P2Y12: Platelet Function  P2Y12: 248 [PRU] (ref 194–418)

## 2014-01-01 ENCOUNTER — Encounter (HOSPITAL_COMMUNITY)
Admission: RE | Admit: 2014-01-01 | Discharge: 2014-01-01 | Disposition: A | Discharge status: Home / Self Care | Payer: Medicare Other | Source: Ambulatory Visit | Attending: Cardiovascular Disease | Admitting: Cardiovascular Disease

## 2014-01-01 DIAGNOSIS — Z5189 Encounter for other specified aftercare: Secondary | ICD-10-CM | POA: Diagnosis not present

## 2014-01-01 NOTE — Progress Notes (Signed)
Brittany [name] 80 y.o. female Nutrition Note Spoke with pt. Pt is overweight. Pt stated she wanted to lose wt. Per discussion, pt is not actively trying to lose wt. Nutrition Plan and Nutrition Survey goals reviewed with pt. Pt is following Step 2 of the Therapeutic Lifestyle Changes diet. Pt watches sodium intake by choosing mostly fresh, frozen, or no added salt canned vegetables. Pt reports she does not add salt to her food when cooking and only adds salt at the table "if something really needs it." Pt is on Coumadin and is aware of the need to follow a diet consistent in Vitamin K intake. Age-appropriate nutrition recommendations discussed.  Nutrition Diagnosis   Food-and nutrition-related knowledge deficit related to lack of exposure to information as related to diagnosis of: ? CVD   Nutrition RX/ Re-Estimated Daily Nutrition Needs for: wt maintenance 1500-1750 Kcal, 50-55 gm fat, 10-12 gm sat fat, 1.5-1.7 gm trans-fat, <1500 mg sodium  Nutrition Intervention   Pt's individual nutrition plan reviewed with pt.   Benefits of adopting Therapeutic Lifestyle Changes discussed when Medficts reviewed.   Pt to attend the Portion Distortion class   Pt given handouts for: ? Nutrition I class ? Nutrition II class    Continue client-centered nutrition education by RD, as part of interdisciplinary care. Goal(s)   Pt to describe the benefit of including fruits, vegetables, whole grains, and low-fat dairy products in a heart healthy meal plan. Monitor and Evaluate progress toward nutrition goal with team. Nutrition Risk: Change to Moderate Derek Mound, M.Ed, RD, LDN, CDE 01/01/2014 3:57 PM

## 2014-01-02 ENCOUNTER — Telehealth: Payer: Self-pay | Admitting: Cardiovascular Disease

## 2014-01-02 DIAGNOSIS — Z23 Encounter for immunization: Secondary | ICD-10-CM | POA: Diagnosis not present

## 2014-01-02 NOTE — Telephone Encounter (Signed)
Spoke with patient and advised that per Dr. Acie Fredrickson patient should continue with Plavix.  Patient states this makes her so happy; that she feels 100% better since stopping Brilinta.  Patient verbalized understanding and gratitude.

## 2014-01-02 NOTE — Telephone Encounter (Signed)
I discussed with Dr.McAlhany. Staying on the current dose of Plavix will likely be her best choice. She had significant bleeding with Brilinta Her age precludes her from getting Effient The P2Y12 is a bit higher than we would like but she seems to be doing OK Especially with the coumadin therapy.  Continue as we are.

## 2014-01-02 NOTE — Telephone Encounter (Signed)
New message ° ° ° ° ° ° ° ° °Pt calling for results °

## 2014-01-02 NOTE — Telephone Encounter (Signed)
Dr. Acie Fredrickson, please advise regarding patient's p2y12 result.

## 2014-01-03 ENCOUNTER — Encounter (HOSPITAL_COMMUNITY): Payer: Medicare Other

## 2014-01-06 ENCOUNTER — Encounter (HOSPITAL_COMMUNITY)
Admission: RE | Admit: 2014-01-06 | Discharge: 2014-01-06 | Disposition: A | Discharge status: Home / Self Care | Payer: Medicare Other | Source: Ambulatory Visit | Attending: Cardiovascular Disease | Admitting: Cardiovascular Disease

## 2014-01-06 DIAGNOSIS — Z5189 Encounter for other specified aftercare: Secondary | ICD-10-CM | POA: Diagnosis not present

## 2014-01-08 ENCOUNTER — Encounter (HOSPITAL_COMMUNITY)
Admission: RE | Admit: 2014-01-08 | Discharge: 2014-01-08 | Disposition: A | Discharge status: Home / Self Care | Payer: Medicare Other | Source: Ambulatory Visit | Attending: Cardiovascular Disease | Admitting: Cardiovascular Disease

## 2014-01-08 DIAGNOSIS — Z5189 Encounter for other specified aftercare: Secondary | ICD-10-CM | POA: Diagnosis not present

## 2014-01-10 ENCOUNTER — Encounter (HOSPITAL_COMMUNITY)
Admission: RE | Admit: 2014-01-10 | Discharge: 2014-01-10 | Disposition: A | Discharge status: Home / Self Care | Payer: Medicare Other | Source: Ambulatory Visit | Attending: Cardiovascular Disease | Admitting: Cardiovascular Disease

## 2014-01-10 DIAGNOSIS — Z952 Presence of prosthetic heart valve: Secondary | ICD-10-CM | POA: Insufficient documentation

## 2014-01-10 DIAGNOSIS — I5032 Chronic diastolic (congestive) heart failure: Secondary | ICD-10-CM | POA: Insufficient documentation

## 2014-01-10 DIAGNOSIS — I482 Chronic atrial fibrillation: Secondary | ICD-10-CM | POA: Diagnosis not present

## 2014-01-10 DIAGNOSIS — I252 Old myocardial infarction: Secondary | ICD-10-CM | POA: Insufficient documentation

## 2014-01-13 ENCOUNTER — Encounter (HOSPITAL_COMMUNITY)
Admission: RE | Admit: 2014-01-13 | Discharge: 2014-01-13 | Disposition: A | Discharge status: Home / Self Care | Payer: Medicare Other | Source: Ambulatory Visit | Attending: Cardiovascular Disease | Admitting: Cardiovascular Disease

## 2014-01-13 ENCOUNTER — Other Ambulatory Visit: Payer: Self-pay | Admitting: Gastroenterology

## 2014-01-13 DIAGNOSIS — I482 Chronic atrial fibrillation: Secondary | ICD-10-CM | POA: Diagnosis not present

## 2014-01-13 NOTE — Progress Notes (Signed)
Darwin's blood pressure was noted at 92/60 on the nustep today. Entry blood pressure 102/70 this afternoon. Patient was given Gatorade repeat blood pressure 100/60 sitting 110/70 standing.  Weslee proceeded to exercise on the nustep without further complaints.  Exit blood pressure 86/73. Patient remains asymptomatic and given Gatorade.  Repeat blood pressure 111/52 sitting.  Standing blood pressure 120/72. Kariyah told me took a Lasix this morning for some edema over the weekend. Post exercise heart rate noted in the 50's nonsustained. Jailee is in chronic atrial fibrillation. Will notify Dr Acie Fredrickson of today's blood pressure's and heart rate. Will fax exercise flow sheets to Dr. Elmarie Shiley office for review.

## 2014-01-14 ENCOUNTER — Telehealth: Payer: Self-pay | Admitting: Nurse Practitioner

## 2014-01-14 NOTE — Telephone Encounter (Signed)
Reported Dr. Elmarie Shiley advice to [name], RN at [facility]:  As long as she is asymptomatic, continue to observe  Previous Messages     ----- Message -----  From: Emmaline Life, RN  Sent: 01/13/2014 5:20 PM  To: Thayer Headings, MD  Subject: FW: low blood pressure at cardiac rehab     ----- Message -----  From: Magda Kiel, RN  Sent: 01/13/2014 4:57 PM  To: Emmaline Life, RN  Subject: low blood pressure at cardiac rehab   Good evening Sharyn Lull,   I faxed some ECG tracing's for Dr Acie Fredrickson to take a look at on Mrs Havener. Ms Nordell's had some resting heart rates in the 50's today non sustained. Mrs Sankey is in chronic atrial fibrillation. She was asymptomatic. Mrs Macdonnell also had some low blood pressure's at cardiac rehab. Mrs Kardell took a Lasix this morning for lower extremity edema. After given Gatorade to Drink her exit standing blood pressure was 120/72. The Strips and exercise flow sheets have been sent over on the fax. I let her go home.    Thanks for your assistance   [name]

## 2014-01-15 ENCOUNTER — Encounter (HOSPITAL_COMMUNITY)
Admission: RE | Admit: 2014-01-15 | Discharge: 2014-01-15 | Disposition: A | Discharge status: Home / Self Care | Payer: Medicare Other | Source: Ambulatory Visit | Attending: Cardiovascular Disease | Admitting: Cardiovascular Disease

## 2014-01-15 DIAGNOSIS — I482 Chronic atrial fibrillation: Secondary | ICD-10-CM | POA: Diagnosis not present

## 2014-01-15 NOTE — Progress Notes (Signed)
Brittany [name] returned to cardiac rehab. Dr Acie Fredrickson reviewed her ECG tracing's and blood pressure's  from Monday no new orders received.  Brittany [name]'s weight is 67.6 kg which is up 1.9 kg from Monday. Brittany [name] denies shortness of breath. Lung fields essentially clear upon ascultation.  No peripheral edema noted. Oxygen saturation 100% on room air.  Brittany [name] said she had chicken tenders and rice last night for dinner and took a furosemide yesterday for swelling. Will continue to monitor the patient and notify Dr Acie Fredrickson of weight gain.

## 2014-01-15 NOTE — Progress Notes (Signed)
Patient instructed to watch salt intake

## 2014-01-17 ENCOUNTER — Encounter (HOSPITAL_COMMUNITY)
Admission: RE | Admit: 2014-01-17 | Discharge: 2014-01-17 | Disposition: A | Discharge status: Home / Self Care | Payer: Medicare Other | Source: Ambulatory Visit | Attending: Cardiovascular Disease | Admitting: Cardiovascular Disease

## 2014-01-17 DIAGNOSIS — I482 Chronic atrial fibrillation: Secondary | ICD-10-CM | POA: Diagnosis not present

## 2014-01-20 ENCOUNTER — Ambulatory Visit (INDEPENDENT_AMBULATORY_CARE_PROVIDER_SITE_OTHER): Payer: Medicare Other | Admitting: Physician Assistant

## 2014-01-20 ENCOUNTER — Encounter (HOSPITAL_COMMUNITY): Payer: Medicare Other

## 2014-01-20 ENCOUNTER — Encounter: Payer: Self-pay | Admitting: Physician Assistant

## 2014-01-20 ENCOUNTER — Telehealth: Payer: Self-pay | Admitting: Cardiovascular Disease

## 2014-01-20 VITALS — BP 122/64 | HR 71 | Ht 62.0 in | Wt 148.0 lb

## 2014-01-20 DIAGNOSIS — Z954 Presence of other heart-valve replacement: Secondary | ICD-10-CM

## 2014-01-20 DIAGNOSIS — I214 Non-ST elevation (NSTEMI) myocardial infarction: Secondary | ICD-10-CM | POA: Diagnosis not present

## 2014-01-20 DIAGNOSIS — R0602 Shortness of breath: Secondary | ICD-10-CM | POA: Diagnosis not present

## 2014-01-20 DIAGNOSIS — I5032 Chronic diastolic (congestive) heart failure: Secondary | ICD-10-CM | POA: Diagnosis not present

## 2014-01-20 DIAGNOSIS — I10 Essential (primary) hypertension: Secondary | ICD-10-CM

## 2014-01-20 DIAGNOSIS — I482 Chronic atrial fibrillation, unspecified: Secondary | ICD-10-CM

## 2014-01-20 DIAGNOSIS — I2510 Atherosclerotic heart disease of native coronary artery without angina pectoris: Secondary | ICD-10-CM

## 2014-01-20 DIAGNOSIS — Z952 Presence of prosthetic heart valve: Secondary | ICD-10-CM

## 2014-01-20 DIAGNOSIS — I350 Nonrheumatic aortic (valve) stenosis: Secondary | ICD-10-CM

## 2014-01-20 LAB — CBC WITH DIFFERENTIAL/PLATELET
BASOS PCT: 0.6 % (ref 0.0–3.0)
Basophils Absolute: 0 10*3/uL (ref 0.0–0.1)
EOS ABS: 0.1 10*3/uL (ref 0.0–0.7)
Eosinophils Relative: 0.9 % (ref 0.0–5.0)
HCT: 31.3 % — ABNORMAL LOW (ref 36.0–46.0)
Hemoglobin: 9.9 g/dL — ABNORMAL LOW (ref 12.0–15.0)
LYMPHS PCT: 16.2 % (ref 12.0–46.0)
Lymphs Abs: 1.4 10*3/uL (ref 0.7–4.0)
MCHC: 31.6 g/dL (ref 30.0–36.0)
MCV: 81.3 fl (ref 78.0–100.0)
Monocytes Absolute: 0.9 10*3/uL (ref 0.1–1.0)
Monocytes Relative: 10.3 % (ref 3.0–12.0)
NEUTROS PCT: 72 % (ref 43.0–77.0)
Neutro Abs: 6.2 10*3/uL (ref 1.4–7.7)
Platelets: 266 10*3/uL (ref 150.0–400.0)
RBC: 3.85 Mil/uL — AB (ref 3.87–5.11)
RDW: 15.3 % (ref 11.5–15.5)
WBC: 8.6 10*3/uL (ref 4.0–10.5)

## 2014-01-20 LAB — BASIC METABOLIC PANEL
BUN: 16 mg/dL (ref 6–23)
CHLORIDE: 100 meq/L (ref 96–112)
CO2: 25 meq/L (ref 19–32)
Calcium: 8.9 mg/dL (ref 8.4–10.5)
Creatinine, Ser: 0.7 mg/dL (ref 0.4–1.2)
GFR: 81.88 mL/min (ref >60.00)
GLUCOSE: 91 mg/dL (ref 70–99)
POTASSIUM: 4.5 meq/L (ref 3.5–5.1)
SODIUM: 134 meq/L — AB (ref 135–145)

## 2014-01-20 LAB — BRAIN NATRIURETIC PEPTIDE: Pro B Natriuretic peptide (BNP): 357 pg/mL — ABNORMAL HIGH (ref 0.0–100.0)

## 2014-01-20 MED ORDER — POTASSIUM CHLORIDE ER 20 MEQ PO TBCR: 10.0000 meq | EXTENDED_RELEASE_TABLET | Freq: Every day | ORAL | Status: DC

## 2014-01-20 MED ORDER — FUROSEMIDE 40 MG PO TABS: 40.0000 mg | ORAL_TABLET | ORAL | Status: DC

## 2014-01-20 MED ORDER — LISINOPRIL 20 MG PO TABS: 20.0000 mg | ORAL_TABLET | Freq: Every day | ORAL | Status: DC

## 2014-01-20 MED ORDER — CLOPIDOGREL BISULFATE 75 MG PO TABS: 75.0000 mg | ORAL_TABLET | Freq: Every day | ORAL | Status: DC

## 2014-01-20 NOTE — Patient Instructions (Signed)
Your physician has recommended you make the following change in your medication:  1. TAKE LASIX 40 MG AND POTASSIUM 10 MEQ FOR 3 DAYS IN A ROW; 2. AFTER THE 3 DAYS IN A ROW YOU WILL THEN CHANGE LASIX AND POTASSIUM TO TAKING ONLY ON TUES, THURS, SAT, AND, SUNDAYS 3. DECREASE LISINOPRIL TO 20 MG DAILY  YOU WILL NEED LAB TODAY; BMET. CBC W/DIFF, BNP  REPEAT BMET IN 1 WEEK  MAKE SURE TO LIMIT FLUIDS TO NO MORE THAN 60 OZ DAILY  KEEP YOUR APPT 010/29/15 WITH DR. Acie Fredrickson

## 2014-01-20 NOTE — Telephone Encounter (Signed)
Discussed with Brynda Rim PA and he is seeing patient today

## 2014-01-20 NOTE — Progress Notes (Addendum)
Cardiology Office Note    Date:  01/20/2014   ID:  Brittany [name], DOB 12-31-1927, MRN [id number]  PCP:  Mayra Neer, MD  Cardiologist:  Dr. Liam Rogers     History of Present Illness: Brittany [name] is a 80 y.o. female with a history of bioprosthetic aortic valve replacement, prior stroke, chronic atrial fibrillation, diastolic CHF, HTN, HL. She was admitted in 7/15 with acute on chronic diastolic CHF.  She was readmitted in 7/15 with non-STEMI. LHC demonstrated severe ostial RCA disease which was treated with a DES. P2Y12 was 289 and she was changed from Plavix to Brilinta. She was also changed from Xarelto to Coumadin.  Last seen by Dr. Acie Fredrickson 7/15.  She did call in in September with dyspnea and was changed from Brilinta to Plavix.  She called in today with concerns of increasing weight and increasing dyspnea. She typically takes Lasix on days that she feels her weight has increased. She typically struggles with low blood pressures at cardiac rehabilitation after taking Lasix. She is not symptomatic with this. However, the staff at cardiac rehabilitation has her drink fluids and Gatorade before they let her drive home.   Her weight is increased about 8 pounds.  She typically has good diuresis with Lasix.  She has noted increased dyspnea with exertion over the last several days associated with a nonproductive cough. She is NYHA class 2b-3.  She denies orthopnea or PND. She has noted increasing abdominal girth as well as increasing LE edema. She denies chest discomfort. She denies syncope.  Studies:  - LHC (7/15):  Ostial D2 50%, mod disease mid LAD, mild to mod disease in CFX and it's branches, ostial RCA 90% >>> PCI:  2.5 x 14 Resolute DES to ostial RCA  - Echo (7/15):  Mod LVH, EF 60-65%, Gr 2 DD, AVR ok without AS, MAC, mild MR, severe LAE, mild to mod RVE, normal RVSF, severe RAE, PASP 41 mmHg  - Nuclear (10/13):  Normal stress nuclear study.  LV Ejection Fraction: 73%   Recent  Labs/Images:  Recent Labs  10/10/13 0849 10/12/13 0525  10/21/13 0423 12/10/13 1307  NA 136*  --   < > 134* 134*  K 3.9  --   < > 4.2 4.3  BUN 8  --   < > 8 15  CREATININE 0.55  --   < > 0.55 0.7  ALT 20  --   --   --   --   HGB 12.2  --   < > 12.1  --   LDLCALC  --  86  --   --   --   HDL  --  62  --   --   --   PROBNP  --   --   < >  --  339.0*  < > = values in this interval not displayed.   No results found.   Wt Readings from Last 3 Encounters:  01/20/14 148 lb (67.132 kg)  11/07/13 148 lb 2.4 oz (67.2 kg)  11/06/13 148 lb 9.6 oz (67.405 kg)     Past Medical History  Diagnosis Date  . Dyslipidemia     takes Lipitor daily  . Arthritis   . Urine incontinence   . Diverticulosis   . IC (interstitial cystitis)   . S/P aortic valve replacement     2005  . H/O hiatal hernia   . History of TIA (transient ischemic attack)     2013-- RESIDUAL PERIPHERAL  VISION RIGHT EYE--  RESOLVED  . History of bacterial endocarditis   . Foot drop     SINCE 1987  . History of gastritis   . Chronic back pain   . Hypertension   . Atrial fibrillation   . GERD (gastroesophageal reflux disease)     Current Outpatient Prescriptions  Medication Sig Dispense Refill  . atenolol (TENORMIN) 25 MG tablet Take 1 tablet (25 mg total) by mouth daily.  30 tablet  5  . atorvastatin (LIPITOR) 20 MG tablet Take 20 mg by mouth daily.       . butalbital-acetaminophen-caffeine (FIORICET) 50-325-40 MG per tablet Take 1 tablet by mouth every 6 (six) hours as needed for headache.  10 tablet  0  . cholecalciferol (VITAMIN D) 1000 UNITS tablet Take 1,000 Units by mouth daily.      . clopidogrel (PLAVIX) 75 MG tablet Take 1 tablet (75 mg total) by mouth daily.  30 tablet  11  . diazepam (VALIUM) 5 MG tablet every 12 (twelve) hours as needed for muscle spasms.       Marland Kitchen diltiazem (CARDIZEM LA) 120 MG 24 hr tablet Take 1 tablet (120 mg total) by mouth daily.  30 tablet  5  . furosemide (LASIX) 40 MG tablet PRN  weight gain      . HYDROcodone-acetaminophen (NORCO/VICODIN) 5-325 MG per tablet 1 tablet every 6 (six) hours as needed for moderate pain.       Marland Kitchen lisinopril (PRINIVIL,ZESTRIL) 20 MG tablet Take 40 mg by mouth daily. Pt takes 20mg  two tabs daily      . Multiple Vitamins-Minerals (MULTIVITAMIN WITH MINERALS) tablet Take 1 tablet by mouth daily.      . Omega-3 Fatty Acids (FISH OIL PO) Take 1 g by mouth daily.       Marland Kitchen oxybutynin (DITROPAN) 5 MG tablet Take 5 mg by mouth 3 (three) times daily.      . pantoprazole (PROTONIX) 40 MG tablet TAKE 1 TABLET (40 MG TOTAL) BY MOUTH DAILY.  30 tablet  0  . Potassium Chloride ER 20 MEQ TBCR       . promethazine (PHENERGAN) 25 MG tablet TAKE 1 TABLET (25 MG TOTAL) BY MOUTH EVERY 6 (SIX) HOURS AS NEEDED FOR NAUSEA OR VOMITING.  30 tablet  3  . warfarin (COUMADIN) 5 MG tablet Take 1.5 tablets (7.5 mg total) by mouth daily at 6 PM.  30 tablet  5   No current facility-administered medications for this visit.     Allergies:   Amoxicillin; Cephalexin; Codeine; Irbesartan; Morphine and related; Neurontin; Nitrofurantoin monohyd macro; Prednisone; and Sulfa antibiotics   Social History:  The patient  reports that she quit smoking about 23 years ago. Her smoking use included Cigarettes. She smoked 0.00 packs per day. She has never used smokeless tobacco. She reports that she does not drink alcohol or use illicit drugs.   Family History:  The patient's family history includes Alcohol abuse in her sister and sister; Bipolar disorder in her brother; Colon cancer in her maternal aunt; Coronary artery disease in her brother; Heart disease in her father; Stomach cancer in her maternal grandmother.   ROS:  Please see the history of present illness.      All other systems reviewed and negative.    PHYSICAL EXAM: VS:  BP 122/64  Pulse 71  Ht 5\' 2"  (1.575 m)  Wt 148 lb (67.132 kg)  BMI 27.06 kg/m2 Well nourished, well developed, in no acute distress HEENT:  normal  Neck: + JVD Cardiac:  normal S1, S2; irreg irreg rhythm; no murmurno S3 Lungs:  Coarse breath sounds at the bases bilaterally, no wheezing or rhonchi  Abd: soft, nontender, no hepatomegaly Ext: trace-1+ bilateral ankle edema Skin: warm and dry Neuro:  CNs 2-12 intact, no focal abnormalities noted  EKG:  Atrial fibrillation, HR 71, nonspecific ST-T wave changes, low voltage, septal Q waves, no change from prior tracing      ASSESSMENT AND PLAN:  1. Chronic diastolic HF (heart failure):  She appears to have evidence of volume excess. I believe that her increased dyspnea is related to this. I will have her take Lasix 40 mg daily for the next 3 days along with her potassium. She has had issues with low blood pressures at times. I will have her remain on a scheduled regimen of Lasix and potassium on Tuesday, Thursday, Saturday Sunday.  Check a basic metabolic panel, CBC and BNP today. Repeat a basic metabolic panel in one week.  She typically drinks a large amount of water in a day. I have asked her to limit this to 60 ounces a day.  She denies salt rich diet.  2. Coronary artery disease:  No angina. She remains on Plavix in addition to warfarin. Continue beta blocker, statin.  3. Essential hypertension:  Controlled. She notes several episodes of hypotension cardiac rehabilitation. I will decrease her lisinopril to 20 mg a day given the scheduled dose of Lasix.  4. Chronic atrial fibrillation:  Rate is controlled on combination of diltiazem and atenolol. Continue warfarin.  5. Aortic stenosis S/P AVR (aortic valve replacement):  Echo in July with stable AVR. Continue SBE prophylaxis.  Disposition:   FU with Dr. Acie Fredrickson 02/06/14 as planned.    Signed, Versie Starks, MHS 01/20/2014 11:15 AM    [facility] [address], [place], [place]  [address] Phone: [phone]; Fax: [phone]

## 2014-01-20 NOTE — Telephone Encounter (Signed)
°  Patient is real concerned with the fluid and weight gain she's been having over the last 7-10days. Please call and advise.

## 2014-01-21 ENCOUNTER — Other Ambulatory Visit: Payer: Self-pay

## 2014-01-21 DIAGNOSIS — D649 Anemia, unspecified: Secondary | ICD-10-CM

## 2014-01-22 ENCOUNTER — Encounter (HOSPITAL_COMMUNITY)
Admission: RE | Admit: 2014-01-22 | Discharge: 2014-01-22 | Disposition: A | Discharge status: Home / Self Care | Payer: Medicare Other | Source: Ambulatory Visit | Attending: Cardiovascular Disease | Admitting: Cardiovascular Disease

## 2014-01-22 ENCOUNTER — Other Ambulatory Visit (INDEPENDENT_AMBULATORY_CARE_PROVIDER_SITE_OTHER): Payer: Medicare Other | Admitting: *Deleted

## 2014-01-22 DIAGNOSIS — I482 Chronic atrial fibrillation: Secondary | ICD-10-CM | POA: Diagnosis not present

## 2014-01-22 DIAGNOSIS — D649 Anemia, unspecified: Secondary | ICD-10-CM

## 2014-01-22 LAB — IRON AND TIBC
%SAT: 12 % — ABNORMAL LOW (ref 20–55)
Iron: 50 ug/dL (ref 42–145)
TIBC: 434 ug/dL (ref 250–470)
UIBC: 384 ug/dL (ref 125–400)

## 2014-01-22 LAB — CBC WITH DIFFERENTIAL/PLATELET
BASOS ABS: 0.1 10*3/uL (ref 0.0–0.1)
Basophils Relative: 0.9 % (ref 0.0–3.0)
Eosinophils Absolute: 0.2 10*3/uL (ref 0.0–0.7)
Eosinophils Relative: 2.3 % (ref 0.0–5.0)
HCT: 31.4 % — ABNORMAL LOW (ref 36.0–46.0)
Hemoglobin: 9.9 g/dL — ABNORMAL LOW (ref 12.0–15.0)
LYMPHS PCT: 26.2 % (ref 12.0–46.0)
Lymphs Abs: 1.9 10*3/uL (ref 0.7–4.0)
MCHC: 31.5 g/dL (ref 30.0–36.0)
MCV: 80.8 fl (ref 78.0–100.0)
MONO ABS: 1 10*3/uL (ref 0.1–1.0)
Monocytes Relative: 14.2 % — ABNORMAL HIGH (ref 3.0–12.0)
NEUTROS PCT: 56.4 % (ref 43.0–77.0)
Neutro Abs: 4.1 10*3/uL (ref 1.4–7.7)
Platelets: 292 10*3/uL (ref 150.0–400.0)
RBC: 3.89 Mil/uL (ref 3.87–5.11)
RDW: 15.6 % — AB (ref 11.5–15.5)
WBC: 7.2 10*3/uL (ref 4.0–10.5)

## 2014-01-22 LAB — VITAMIN B12: Vitamin B-12: 496 pg/mL (ref 211–911)

## 2014-01-22 LAB — IRON: Iron: 52 ug/dL (ref 42–145)

## 2014-01-22 LAB — FERRITIN: Ferritin: 17.9 ng/mL (ref 10.0–291.0)

## 2014-01-23 LAB — FOLATE RBC

## 2014-01-24 ENCOUNTER — Encounter (HOSPITAL_COMMUNITY)
Admission: RE | Admit: 2014-01-24 | Discharge: 2014-01-24 | Disposition: A | Discharge status: Home / Self Care | Payer: Medicare Other | Source: Ambulatory Visit | Attending: Cardiovascular Disease | Admitting: Cardiovascular Disease

## 2014-01-24 ENCOUNTER — Other Ambulatory Visit (INDEPENDENT_AMBULATORY_CARE_PROVIDER_SITE_OTHER): Payer: Medicare Other | Admitting: *Deleted

## 2014-01-24 ENCOUNTER — Ambulatory Visit (INDEPENDENT_AMBULATORY_CARE_PROVIDER_SITE_OTHER): Payer: Medicare Other

## 2014-01-24 ENCOUNTER — Telehealth: Payer: Self-pay | Admitting: *Deleted

## 2014-01-24 DIAGNOSIS — D62 Acute posthemorrhagic anemia: Secondary | ICD-10-CM

## 2014-01-24 DIAGNOSIS — I214 Non-ST elevation (NSTEMI) myocardial infarction: Secondary | ICD-10-CM | POA: Diagnosis not present

## 2014-01-24 DIAGNOSIS — R42 Dizziness and giddiness: Secondary | ICD-10-CM | POA: Diagnosis not present

## 2014-01-24 DIAGNOSIS — I4891 Unspecified atrial fibrillation: Secondary | ICD-10-CM

## 2014-01-24 DIAGNOSIS — I482 Chronic atrial fibrillation, unspecified: Secondary | ICD-10-CM

## 2014-01-24 DIAGNOSIS — I480 Paroxysmal atrial fibrillation: Secondary | ICD-10-CM | POA: Diagnosis not present

## 2014-01-24 LAB — POCT INR: INR: 1.9

## 2014-01-24 NOTE — Telephone Encounter (Signed)
Follow up ° ° ° ° ° °Returning Carol's call °

## 2014-01-24 NOTE — Telephone Encounter (Signed)
lmptcb on both home and cell # for lab results

## 2014-01-26 LAB — FOLATE RBC: RBC Folate: 1280 ng/mL (ref >280)

## 2014-01-27 ENCOUNTER — Encounter (HOSPITAL_COMMUNITY): Payer: Medicare Other

## 2014-01-27 DIAGNOSIS — R05 Cough: Secondary | ICD-10-CM | POA: Diagnosis not present

## 2014-01-27 NOTE — Telephone Encounter (Signed)
Spoke with pt - she has been having a deep "congestive" cough all weekend that has not improved with increased Furosemide.  She reports her wt is stable- not back to what it was but not increased.  Reports edema bilaterally up to where socks are - right worse than left. No fever, no sinus pressure.  Cough sounds very deep and she is c/o SOB.  Would like Scott to tell her what to do.  She is aware of her lab results and that she is supposed to repeat it today.  Will forward to [facility] for review and further recommendations.

## 2014-01-27 NOTE — Telephone Encounter (Signed)
Called pt and advised her of Scott's recommendations.  She will call to see if she can get in with her PCP.  She will call back if unable to obtain an appt.

## 2014-01-27 NOTE — Telephone Encounter (Signed)
Her BNP was not really very high.  It does not sound like her cough improved with diuresis. I would be concerned about a possible infection with those symptoms rather than worsening fluid. She should be seen to determine if she needs a CXR, antibiotics, etc.   I would recommend that she try to get in to see her PCP today.  If her PCP cannot see her, can we add her on to the [facility] this afternoon?  I don't have any openings.  If the [facility] doesn't have anything, let me know and I can fit her in somewhere.   Richardson Dopp, PA-C   01/27/2014 10:26 AM

## 2014-01-27 NOTE — Telephone Encounter (Signed)
Follow up      Returning Brittany [name]'s call----Since Friday pt is having a deep chest cough, sob and chest pressure

## 2014-01-28 ENCOUNTER — Other Ambulatory Visit: Payer: Medicare Other

## 2014-01-28 ENCOUNTER — Telehealth: Payer: Self-pay

## 2014-01-28 DIAGNOSIS — Z1211 Encounter for screening for malignant neoplasm of colon: Secondary | ICD-10-CM

## 2014-01-28 DIAGNOSIS — D649 Anemia, unspecified: Secondary | ICD-10-CM

## 2014-01-28 NOTE — Telephone Encounter (Signed)
Please place order for hemacult cards. Thanks

## 2014-01-28 NOTE — Telephone Encounter (Signed)
Phone call from lab at [facility], stating that this patient dropped off her hemoccult cards there. They are needing [place] to put an order in for these, so they can result them.

## 2014-01-29 ENCOUNTER — Telehealth (HOSPITAL_COMMUNITY): Payer: Self-pay | Admitting: Family Medicine

## 2014-01-29 ENCOUNTER — Other Ambulatory Visit: Payer: Medicare Other

## 2014-01-29 ENCOUNTER — Encounter (HOSPITAL_COMMUNITY): Payer: Medicare Other

## 2014-01-31 ENCOUNTER — Encounter (HOSPITAL_COMMUNITY): Payer: Medicare Other

## 2014-02-03 ENCOUNTER — Encounter (HOSPITAL_COMMUNITY)
Admission: RE | Admit: 2014-02-03 | Discharge: 2014-02-03 | Disposition: A | Discharge status: Home / Self Care | Payer: Medicare Other | Source: Ambulatory Visit | Attending: Cardiovascular Disease | Admitting: Cardiovascular Disease

## 2014-02-03 NOTE — Progress Notes (Addendum)
Brittany [name] returns today to exercise after being out with an upper respiratory infection entry blood pressure 104/58. Patient walked the track for a few laps and complained of feeling tired. Blood pressure 98/60 patient asymptomatic denies feeling dizzy. Patient given a half of a cup of Gatorade and bottle of water. Sitting and standing blood pressures in the mid 90's. Final repeat blood pressure sitting 97/64. Standing blood pressure 110/48. Brittany [name] did no further exercise today. Will notify Dr Acie Fredrickson about Brittany [name]'s blood pressures today. Brittany [name] has a follow up appointment with Dr Acie Fredrickson on 10.29/2015. Will fax exercise flow sheets to Dr. Elmarie Shiley  office for review with today's blood pressures. No complaints upon exit from cardiac rehab today. Patient reported that she is on an antibiotic for the next ten days prescribed by her primary care physician and will bring the name of the antibiotic on her next exercise session. Oxygen saturation 97% on room air. Lung Fields clear. Patient denies any shortness of breath this afternoon.Will continue to monitor the patient throughout  the program.

## 2014-02-04 ENCOUNTER — Telehealth: Payer: Self-pay | Admitting: Nurse Practitioner

## 2014-02-04 ENCOUNTER — Telehealth (HOSPITAL_COMMUNITY): Payer: Self-pay | Admitting: *Deleted

## 2014-02-04 NOTE — Telephone Encounter (Signed)
Received message from [name], [name] with [facility] that during exercise on 10/26 patient had episode of low blood pressure which was improved when patient sat and drank Gatorade.  I called patient and advised her that Dr. Acie Fredrickson does want her to exercise at her next scheduled session tomorrow and he advises that she eat a hearty breakfast and drink plenty of fluids before her session.  I advised patient that if she has any problems or concerns that Dr. Acie Fredrickson and I will be in the office tomorrow and will be able to be contacted if needed.  I sent a message stating same to [name], [place].  Patient verbalized understanding and agreement.

## 2014-02-05 ENCOUNTER — Encounter (HOSPITAL_COMMUNITY): Payer: Medicare Other

## 2014-02-06 ENCOUNTER — Other Ambulatory Visit: Payer: Self-pay | Admitting: Nurse Practitioner

## 2014-02-06 ENCOUNTER — Ambulatory Visit (INDEPENDENT_AMBULATORY_CARE_PROVIDER_SITE_OTHER): Payer: Medicare Other | Admitting: *Deleted

## 2014-02-06 ENCOUNTER — Encounter: Payer: Self-pay | Admitting: Cardiovascular Disease

## 2014-02-06 ENCOUNTER — Other Ambulatory Visit: Payer: Medicare Other

## 2014-02-06 ENCOUNTER — Ambulatory Visit (INDEPENDENT_AMBULATORY_CARE_PROVIDER_SITE_OTHER): Payer: Medicare Other | Admitting: Cardiovascular Disease

## 2014-02-06 VITALS — BP 114/70 | HR 69 | Ht 62.0 in | Wt 148.6 lb

## 2014-02-06 DIAGNOSIS — I480 Paroxysmal atrial fibrillation: Secondary | ICD-10-CM | POA: Diagnosis not present

## 2014-02-06 DIAGNOSIS — K921 Melena: Secondary | ICD-10-CM

## 2014-02-06 DIAGNOSIS — M542 Cervicalgia: Secondary | ICD-10-CM | POA: Diagnosis not present

## 2014-02-06 DIAGNOSIS — D649 Anemia, unspecified: Secondary | ICD-10-CM | POA: Insufficient documentation

## 2014-02-06 DIAGNOSIS — Z954 Presence of other heart-valve replacement: Secondary | ICD-10-CM | POA: Diagnosis not present

## 2014-02-06 DIAGNOSIS — M545 Low back pain: Secondary | ICD-10-CM | POA: Diagnosis not present

## 2014-02-06 DIAGNOSIS — I214 Non-ST elevation (NSTEMI) myocardial infarction: Secondary | ICD-10-CM

## 2014-02-06 DIAGNOSIS — Z952 Presence of prosthetic heart valve: Secondary | ICD-10-CM

## 2014-02-06 DIAGNOSIS — M5416 Radiculopathy, lumbar region: Secondary | ICD-10-CM | POA: Diagnosis not present

## 2014-02-06 DIAGNOSIS — I10 Essential (primary) hypertension: Secondary | ICD-10-CM

## 2014-02-06 DIAGNOSIS — M4692 Unspecified inflammatory spondylopathy, cervical region: Secondary | ICD-10-CM | POA: Diagnosis not present

## 2014-02-06 DIAGNOSIS — I5033 Acute on chronic diastolic (congestive) heart failure: Secondary | ICD-10-CM

## 2014-02-06 LAB — POCT INR: INR: 2.8

## 2014-02-06 LAB — HEMOCCULT SLIDES (X 3 CARDS)
Fecal Occult Blood: NEGATIVE
OCCULT 1: NEGATIVE
OCCULT 2: NEGATIVE
OCCULT 3: NEGATIVE
OCCULT 4: NEGATIVE
OCCULT 5: NEGATIVE

## 2014-02-06 NOTE — Assessment & Plan Note (Signed)
bp is stable

## 2014-02-06 NOTE — Patient Instructions (Signed)
Your physician recommends that you continue on your current medications as directed. Please refer to the Current Medication list given to you today.  Your physician wants you to follow-up in: 6 months with Dr. Nahser.  You will receive a reminder letter in the mail two months in advance. If you don't receive a letter, please call our office to schedule the follow-up appointment.  

## 2014-02-06 NOTE — Assessment & Plan Note (Signed)
Her breathing seems to be stable Continue current meds

## 2014-02-06 NOTE — Progress Notes (Signed)
Brittany [name] Date of Birth  [birth year]       [facility]    [employer] [address], [address], [place], [place]  [address]   [place], [place]  [address] [phone]     [phone]   Fax  [phone]    Fax [phone]  Problem List: 1. Aortic valve replacement 2. Dyslipidemia 3. Hypertension 4. 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     5. Atrial fibrillation  History of Present Illness:  Brittany [name] is an 80 yo with aortic valve replacement, HTN, and hyperlipidemia.  She has not had any cardiac complaints recently.  She complains of easy bruising on her arms.    She had an episode of severe heart burn this past week.    She's had a stroke over the past year. This has left her with some balance issues and some loss of peripheral vision.  Nov. 4, 2014:  Brittany [name] is doing well from a cardiac standpoint.  She is having problems with interstitial cystitis.  She had a procedure last week and had a small bladder tear which they are letting heal.  She is still having some significant pain.    Aug 27, 2013:  Brittany [name] is doing OK.  She is not having any problems .  Still very active.   She had an AVR   She is having problems with interstitial cyctitis.    November 06, 2013:  Brittany [name] was admitted to the hospital twice on July , 2015: DC summary :  80 year old Caucasian female with past medical history significant for hypertension, hyperlipidemia, history of CVA permanent atrial fibrillation on Xarelto for the last 2 years, and history of aortic valve replacement with bioprosthetic valve in 2005, here with chest pain. She was recently admitted to the hospital for management of malignant HTN , diastolic HF and headache. She was seen by Dr Haroldine Laws and [place] on 10/16/2013. She returns within 24 hrs for symptoms of chest pain . Pt states that earlier in the afternoon while she was trying to sleep post lunch she started  experiencing soreness , heaviness "someone sitting on my chest" on the left side radiating to left arm with associated mild SOB. This improved with SL NTG , however, she continues to have some chest pain. Pt denied any orthopnea, PND , LE edema , syncope, claudication , palpitation etc . She reports medication compliance. She bruises easily in her skin and once episodes of epistaxis several months ago.  She was admitted. Xarelto was held in anticipation of coronary angiography. ASA, statin, BB, IV NTG and heparin added. She ruled in for NSTEMI with troponin of 0.63. Left heart cath revealed severe, fibrotic disease in the ostial right coronary artery which was the culprit lesion. This was successfully stented with a 2.5 x 14 resolute drug-eluting stent, postdilated to 3.3 mm in diameter. She was started on plavix, however, a P2Y12 came back at 289 so she was started on Brilinta. Cardizem cd 120 was added for afib RVR with improvement. Due to mild cough Lisinopril was changed to irbesartan. Xarelto was changed to coumadin and she will follow up two days after DC in the coumadin clinic   She now presents for follow up .  She has some MSK pain ( hurts when she presses her chest.   She has had some bleeding.   She woke up in the morning with a  small skin tear on her right ankle .  There was lots of blood in the bed.    The bleeding eventually stopped with pressure.    She also has had a nose bleed.   She has not had any chest pain and her energy levels have been better. INR levels have been OK.    Oct. 29, 2015:  Brittany [name]  feeling better. She had a bad viral illness last week and thinks that she might have the flu.  She is getting better.  She's had some problems with anemia and she is turned in 3 sets of guaiac cards. She has had some dyspnea and was started on Lasix on a QOD schedule.   She is now off Brilinta.   She had a major bleeding from he right foot.   She was on brilinta because She had an  inadequate response to plavix originally but later we discovered that she was also on Omeprazole.  We have stopped the omeprazone and her PRU is better.  She is taking Nexium instead.    Off  Eliquis.  Is back on warfarin. doing well      Current Outpatient Prescriptions on File Prior to Visit  Medication Sig Dispense Refill  . atenolol (TENORMIN) 25 MG tablet Take 1 tablet (25 mg total) by mouth daily.  30 tablet  5  . atorvastatin (LIPITOR) 20 MG tablet Take 20 mg by mouth daily.       . butalbital-acetaminophen-caffeine (FIORICET) 50-325-40 MG per tablet Take 1 tablet by mouth every 6 (six) hours as needed for headache.  10 tablet  0  . cholecalciferol (VITAMIN D) 1000 UNITS tablet Take 1,000 Units by mouth daily.      . clopidogrel (PLAVIX) 75 MG tablet Take 1 tablet (75 mg total) by mouth daily.  30 tablet  11  . diazepam (VALIUM) 5 MG tablet every 12 (twelve) hours as needed for muscle spasms.       Marland Kitchen diltiazem (CARDIZEM LA) 120 MG 24 hr tablet Take 1 tablet (120 mg total) by mouth daily.  30 tablet  5  . furosemide (LASIX) 40 MG tablet Take 1 tablet (40 mg total) by mouth as directed. TAKE 40 MG ON TUES, THUR, SAT, AND SUN  30 tablet  11  . HYDROcodone-acetaminophen (NORCO/VICODIN) 5-325 MG per tablet 1 tablet every 6 (six) hours as needed for moderate pain.       Marland Kitchen lisinopril (PRINIVIL,ZESTRIL) 20 MG tablet Take 1 tablet (20 mg total) by mouth daily. Pt takes 20mg  two tabs daily  30 tablet  11  . Multiple Vitamins-Minerals (MULTIVITAMIN WITH MINERALS) tablet Take 1 tablet by mouth daily.      . Omega-3 Fatty Acids (FISH OIL PO) Take 1 g by mouth daily.       Marland Kitchen oxybutynin (DITROPAN) 5 MG tablet Take 5 mg by mouth 3 (three) times daily.      . pantoprazole (PROTONIX) 40 MG tablet TAKE 1 TABLET (40 MG TOTAL) BY MOUTH DAILY.  30 tablet  0  . Potassium Chloride ER 20 MEQ TBCR Take 10 mEq by mouth daily.  30 tablet  11  . promethazine (PHENERGAN) 25 MG tablet TAKE 1 TABLET (25 MG TOTAL) BY  MOUTH EVERY 6 (SIX) HOURS AS NEEDED FOR NAUSEA OR VOMITING.  30 tablet  3  . warfarin (COUMADIN) 5 MG tablet Take 1.5 tablets (7.5 mg total) by mouth daily at 6 PM.  30 tablet  5   No current  facility-administered medications on file prior to visit.    Allergies  Allergen Reactions  . Amoxicillin Diarrhea and Nausea And Vomiting  . Cephalexin Diarrhea  . Codeine Nausea Only  . Irbesartan Swelling    Facial and hand numbness and swelling. Patient believes reaction was with this medication but isn't 100% sure. She is currently tolerating lisinopril.  Marland Kitchen Morphine And Related Other (See Comments)    HX ADDICTION / WITHDRAWAL  . Neurontin [Gabapentin] Swelling    Legs swell  . Nitrofurantoin Monohyd Macro Diarrhea and Nausea Only  . Prednisone Nausea Only  . Sulfa Antibiotics Rash    Past Medical History  Diagnosis Date  . Dyslipidemia     takes Lipitor daily  . Arthritis   . Urine incontinence   . Diverticulosis   . IC (interstitial cystitis)   . S/P aortic valve replacement     2005  . H/O hiatal hernia   . History of TIA (transient ischemic attack)     2013-- RESIDUAL PERIPHERAL VISION RIGHT EYE--  RESOLVED  . History of bacterial endocarditis   . Foot drop     SINCE 1987  . History of gastritis   . Chronic back pain   . Hypertension   . Atrial fibrillation   . GERD (gastroesophageal reflux disease)     Past Surgical History  Procedure Laterality Date  . Abdominal hysterectomy  1967  . Aortic valve replacement  09-29-2003     [facility] pericardial tissue valve  . Cholecystectomy  1993  . Vein ligation  1969    RIGHT LOWER LEG  . Dilation and curettage of uterus    . Ercp N/A 08/10/2012    Procedure: ENDOSCOPIC RETROGRADE CHOLANGIOPANCREATOGRAPHY (ERCP);  Surgeon: Inda Castle, MD;  Location: [facility];  Service: Gastroenterology;  Laterality: N/A;  . Cataract extraction w/ intraocular lens  implant, bilateral    . Appendectomy  1933  . Cardiac  catheterization  07-15-2003 DR HELEN PRESTON    MODERATE TO MODERATELY SEVERE CALCIFIC AORTIC STENOSIS/  NORMAL CORONARY ARTERIES  . Right shoulder arthroscopy w/ debridement rotator cuff and labral tear/ acrominoplasty/ distal clavicle excision/ ca ligament release  10-08-1999  . Cysto/ hydrodistention/ instillation clorpactin  MULTIPLE  last one 2009  . Mini-open right rotator cuff repair  07-12-2001  . Transvaginal tape procedure  07-30-2002  . Lumbar disc surgery  1985 &  2007  . Macroplastique urethral implantation  08-27-2009  . Transthoracic echocardiogram  08-10-2011    MILD LVH/  EF 55-60%/  NORMAL AVR TISSUE/ MILD MR/  MODERATE DILATED LA/  MILD DILATED RA  . Cardiovascular stress test  01-30-2012  DR NASHER    NORMAL NUCLEAR STUDY/  EF 73%/  NORMAL LVF  . Tonsillectomy and adenoidectomy    . Carpal tunnel release Bilateral   . Cysto with hydrodistension N/A 02/05/2013    Procedure: CYSTOSCOPY/HYDRODISTENSION;  Surgeon: Irine Seal, MD;  Location: [facility];  Service: Urology;  Laterality: N/A;    History  Smoking status  . Former Smoker  . Types: Cigarettes  . Quit date: 03/06/1990  Smokeless tobacco  . Never Used    History  Alcohol Use No    Family History  Problem Relation Age of Onset  . Heart disease Father   . Coronary artery disease Brother   . Bipolar disorder Brother     commited suiside at 23yo  . Alcohol abuse Sister     sister #1  . Alcohol abuse Sister  sister #2  . Colon cancer Maternal Aunt   . Stomach cancer Maternal Grandmother     Reviw of Systems:  Reviewed in the HPI.  All other systems are negative.  Physical Exam: Blood pressure 114/70, pulse 69, height 5\' 2"  (1.575 m), weight 148 lb 9.6 oz (67.405 kg). General: Well developed, well nourished, in no acute distress.  Head: Normocephalic, atraumatic, sclera non-icteric, mucus membranes are moist,   Neck: Supple. Carotids are 2 + without bruits. No JVD  Lungs:  Clear bilaterally to auscultation.  Heart: irregularly irregular.  normal  S1 S2. No murmurs, gallops or rubs.  Abdomen: Soft, non-tender, non-distended with normal bowel sounds. No hepatomegaly. No rebound/guarding. No masses.  Msk:  Strength and tone are normal  Extremities: No clubbing or cyanosis. No edema.  Distal pedal pulses are 2+ and equal bilaterally.  Neuro: Alert and oriented X 3. Moves all extremities spontaneously.  Psych:  Responds to questions appropriately with a normal affect.  ECG: 11/07/11 - Atrial fibrillation with controlled rate.  Assessment / Plan:

## 2014-02-06 NOTE — Assessment & Plan Note (Signed)
She is stable

## 2014-02-06 NOTE — Assessment & Plan Note (Signed)
HR is very regular. Continue coumadin

## 2014-02-06 NOTE — Assessment & Plan Note (Signed)
guiac cards are negative

## 2014-02-07 ENCOUNTER — Encounter (HOSPITAL_COMMUNITY): Payer: Medicare Other

## 2014-02-07 ENCOUNTER — Telehealth: Payer: Self-pay | Admitting: Cardiovascular Disease

## 2014-02-07 NOTE — Telephone Encounter (Signed)
New message          Pt was told not to come to cardiac rehab due to her bp being elevated / she wanted someone to know

## 2014-02-07 NOTE — Telephone Encounter (Signed)
Pt called to let Dr. Acie Fredrickson and his nurse know that she did not go to cardiac rehab today, because her PB was running low this AM systolic 86 to 98. Pt has drank fluids and ate a good lunch . BP now is 102/67 no symptoms.

## 2014-02-10 ENCOUNTER — Encounter (HOSPITAL_COMMUNITY): Payer: Medicare Other

## 2014-02-12 ENCOUNTER — Encounter (HOSPITAL_COMMUNITY): Payer: Medicare Other

## 2014-02-12 DIAGNOSIS — Z952 Presence of prosthetic heart valve: Secondary | ICD-10-CM | POA: Diagnosis not present

## 2014-02-12 DIAGNOSIS — I5032 Chronic diastolic (congestive) heart failure: Secondary | ICD-10-CM | POA: Insufficient documentation

## 2014-02-12 DIAGNOSIS — I482 Chronic atrial fibrillation: Secondary | ICD-10-CM | POA: Diagnosis not present

## 2014-02-12 DIAGNOSIS — I252 Old myocardial infarction: Secondary | ICD-10-CM | POA: Diagnosis not present

## 2014-02-14 ENCOUNTER — Encounter (HOSPITAL_COMMUNITY)
Admission: RE | Admit: 2014-02-14 | Discharge: 2014-02-14 | Disposition: A | Discharge status: Home / Self Care | Payer: Medicare Other | Source: Ambulatory Visit | Attending: Cardiovascular Disease | Admitting: Cardiovascular Disease

## 2014-02-14 ENCOUNTER — Telehealth: Payer: Self-pay | Admitting: Physician Assistant

## 2014-02-14 DIAGNOSIS — I482 Chronic atrial fibrillation: Secondary | ICD-10-CM | POA: Diagnosis not present

## 2014-02-14 NOTE — Progress Notes (Signed)
Brittany [name]'s weight is up 2.2 kg today from Wednesday. Upon assessment lung fields with fine crackles left base. Oxygen saturation 98% on room air. Positive ankle edema. Patient reports shortness of breath with exertion. Brittany Sousa PA called and notified. Hal instructed Brittany [name] to take her lasix today tomorrow and Sunday. Patient also instructed to watch her salt intake and to limit her fluid intake to less than 2000 Liter per day. Patient states understanding and knows to go to the ED if she develops shortness of breath at rest or chest pain.  Patient also instructed to monitor her weight at home.

## 2014-02-14 NOTE — Telephone Encounter (Signed)
Contacted by cardiac rehab regarding weight gain, LE swelling and increase DOE. Patient denies any dyspnea at rest, orthopnea and PND. It appears patient is on 40mg  PO lasix on T/Th/Sat/Sun. Her BP stable around 126/60. O2 sat 98% on room air. Her baseline dry weight 146. Her current weight 152. I have instructed her to take 40mg  lasix daily until Monday. Meanwhile she should measure her BP daily as she has known hypotension with daily lasix. If her weight does not return to dry weight by next Monday, she should call us and potentially place on daily lasix.   She has been instructed to go to nearest ED if has dyspnea at rest, orthopnea or PND.  Hilbert Corrigan PA Pager: [phone]

## 2014-02-15 ENCOUNTER — Other Ambulatory Visit: Payer: Self-pay | Admitting: Gastroenterology

## 2014-02-17 ENCOUNTER — Encounter (HOSPITAL_COMMUNITY): Payer: Medicare Other

## 2014-02-19 ENCOUNTER — Encounter (HOSPITAL_COMMUNITY)
Admission: RE | Admit: 2014-02-19 | Discharge: 2014-02-19 | Disposition: A | Discharge status: Home / Self Care | Payer: Medicare Other | Source: Ambulatory Visit | Attending: Cardiovascular Disease | Admitting: Cardiovascular Disease

## 2014-02-19 DIAGNOSIS — I482 Chronic atrial fibrillation: Secondary | ICD-10-CM | POA: Diagnosis not present

## 2014-02-20 DIAGNOSIS — M545 Low back pain: Secondary | ICD-10-CM | POA: Diagnosis not present

## 2014-02-20 DIAGNOSIS — M5416 Radiculopathy, lumbar region: Secondary | ICD-10-CM | POA: Diagnosis not present

## 2014-02-21 ENCOUNTER — Encounter (HOSPITAL_COMMUNITY): Payer: Medicare Other

## 2014-02-24 ENCOUNTER — Encounter (HOSPITAL_COMMUNITY)
Admission: RE | Admit: 2014-02-24 | Discharge: 2014-02-24 | Disposition: A | Discharge status: Home / Self Care | Payer: Medicare Other | Source: Ambulatory Visit | Attending: Cardiovascular Disease | Admitting: Cardiovascular Disease

## 2014-02-24 ENCOUNTER — Encounter (HOSPITAL_COMMUNITY): Payer: Medicare Other

## 2014-02-24 DIAGNOSIS — I482 Chronic atrial fibrillation: Secondary | ICD-10-CM | POA: Diagnosis not present

## 2014-02-26 ENCOUNTER — Encounter (HOSPITAL_COMMUNITY)
Admission: RE | Admit: 2014-02-26 | Discharge: 2014-02-26 | Disposition: A | Discharge status: Home / Self Care | Payer: Medicare Other | Source: Ambulatory Visit | Attending: Cardiovascular Disease | Admitting: Cardiovascular Disease

## 2014-02-26 DIAGNOSIS — I482 Chronic atrial fibrillation: Secondary | ICD-10-CM | POA: Diagnosis not present

## 2014-02-28 ENCOUNTER — Encounter (HOSPITAL_COMMUNITY): Payer: Medicare Other

## 2014-03-03 ENCOUNTER — Ambulatory Visit (INDEPENDENT_AMBULATORY_CARE_PROVIDER_SITE_OTHER): Payer: Medicare Other

## 2014-03-03 ENCOUNTER — Encounter (HOSPITAL_COMMUNITY)
Admission: RE | Admit: 2014-03-03 | Discharge: 2014-03-03 | Disposition: A | Discharge status: Home / Self Care | Payer: Medicare Other | Source: Ambulatory Visit | Attending: Cardiovascular Disease | Admitting: Cardiovascular Disease

## 2014-03-03 DIAGNOSIS — I480 Paroxysmal atrial fibrillation: Secondary | ICD-10-CM | POA: Diagnosis not present

## 2014-03-03 DIAGNOSIS — I214 Non-ST elevation (NSTEMI) myocardial infarction: Secondary | ICD-10-CM | POA: Diagnosis not present

## 2014-03-03 DIAGNOSIS — I482 Chronic atrial fibrillation: Secondary | ICD-10-CM | POA: Diagnosis not present

## 2014-03-03 LAB — POCT INR: INR: 1.7

## 2014-03-05 ENCOUNTER — Encounter (HOSPITAL_COMMUNITY): Payer: Medicare Other

## 2014-03-10 ENCOUNTER — Encounter (HOSPITAL_COMMUNITY)
Admission: RE | Admit: 2014-03-10 | Discharge: 2014-03-10 | Disposition: A | Discharge status: Home / Self Care | Payer: Medicare Other | Source: Ambulatory Visit | Attending: Cardiovascular Disease | Admitting: Cardiovascular Disease

## 2014-03-10 DIAGNOSIS — I482 Chronic atrial fibrillation: Secondary | ICD-10-CM | POA: Diagnosis not present

## 2014-03-12 ENCOUNTER — Encounter (HOSPITAL_COMMUNITY)
Admission: RE | Admit: 2014-03-12 | Discharge: 2014-03-12 | Disposition: A | Discharge status: Home / Self Care | Payer: Medicare Other | Source: Ambulatory Visit | Attending: Cardiovascular Disease | Admitting: Cardiovascular Disease

## 2014-03-12 DIAGNOSIS — I252 Old myocardial infarction: Secondary | ICD-10-CM | POA: Insufficient documentation

## 2014-03-12 DIAGNOSIS — I482 Chronic atrial fibrillation: Secondary | ICD-10-CM | POA: Diagnosis not present

## 2014-03-12 DIAGNOSIS — I5032 Chronic diastolic (congestive) heart failure: Secondary | ICD-10-CM | POA: Diagnosis not present

## 2014-03-12 DIAGNOSIS — Z952 Presence of prosthetic heart valve: Secondary | ICD-10-CM | POA: Insufficient documentation

## 2014-03-14 ENCOUNTER — Encounter (HOSPITAL_COMMUNITY)
Admission: RE | Admit: 2014-03-14 | Discharge: 2014-03-14 | Disposition: A | Discharge status: Home / Self Care | Payer: Medicare Other | Source: Ambulatory Visit | Attending: Cardiovascular Disease | Admitting: Cardiovascular Disease

## 2014-03-14 ENCOUNTER — Ambulatory Visit (INDEPENDENT_AMBULATORY_CARE_PROVIDER_SITE_OTHER): Payer: Medicare Other

## 2014-03-14 DIAGNOSIS — I482 Chronic atrial fibrillation: Secondary | ICD-10-CM | POA: Diagnosis not present

## 2014-03-14 DIAGNOSIS — I480 Paroxysmal atrial fibrillation: Secondary | ICD-10-CM | POA: Diagnosis not present

## 2014-03-14 DIAGNOSIS — I214 Non-ST elevation (NSTEMI) myocardial infarction: Secondary | ICD-10-CM

## 2014-03-14 LAB — POCT INR: INR: 1.6

## 2014-03-14 NOTE — Progress Notes (Signed)
[employer] graduates today. Brittany [name] plans to continue exercise in January in the maintenance program. PHQ score = 0. Brittany [name] says her long and short term goals have been met.

## 2014-03-20 ENCOUNTER — Encounter (HOSPITAL_COMMUNITY): Payer: Self-pay | Admitting: Interventional Cardiology

## 2014-03-20 DIAGNOSIS — J069 Acute upper respiratory infection, unspecified: Secondary | ICD-10-CM | POA: Diagnosis not present

## 2014-03-24 ENCOUNTER — Ambulatory Visit (INDEPENDENT_AMBULATORY_CARE_PROVIDER_SITE_OTHER): Payer: Medicare Other | Admitting: *Deleted

## 2014-03-24 DIAGNOSIS — I480 Paroxysmal atrial fibrillation: Secondary | ICD-10-CM

## 2014-03-24 DIAGNOSIS — I214 Non-ST elevation (NSTEMI) myocardial infarction: Secondary | ICD-10-CM | POA: Diagnosis not present

## 2014-03-24 LAB — POCT INR: INR: 2.2

## 2014-04-14 ENCOUNTER — Ambulatory Visit (INDEPENDENT_AMBULATORY_CARE_PROVIDER_SITE_OTHER): Payer: Medicare Other | Admitting: Pharmacist

## 2014-04-14 ENCOUNTER — Other Ambulatory Visit: Payer: Self-pay | Admitting: Physician Assistant

## 2014-04-14 DIAGNOSIS — I214 Non-ST elevation (NSTEMI) myocardial infarction: Secondary | ICD-10-CM | POA: Diagnosis not present

## 2014-04-14 DIAGNOSIS — I480 Paroxysmal atrial fibrillation: Secondary | ICD-10-CM | POA: Diagnosis not present

## 2014-04-14 LAB — POCT INR: INR: 1.7

## 2014-04-16 DIAGNOSIS — M545 Low back pain: Secondary | ICD-10-CM | POA: Diagnosis not present

## 2014-04-16 DIAGNOSIS — M542 Cervicalgia: Secondary | ICD-10-CM | POA: Diagnosis not present

## 2014-04-16 DIAGNOSIS — Z6828 Body mass index (BMI) 28.0-28.9, adult: Secondary | ICD-10-CM | POA: Diagnosis not present

## 2014-04-16 DIAGNOSIS — M4692 Unspecified inflammatory spondylopathy, cervical region: Secondary | ICD-10-CM | POA: Diagnosis not present

## 2014-04-16 DIAGNOSIS — M5416 Radiculopathy, lumbar region: Secondary | ICD-10-CM | POA: Diagnosis not present

## 2014-04-24 ENCOUNTER — Encounter (HOSPITAL_COMMUNITY): Payer: Self-pay | Admitting: Gastroenterology

## 2014-04-28 DIAGNOSIS — M545 Low back pain: Secondary | ICD-10-CM | POA: Diagnosis not present

## 2014-04-28 DIAGNOSIS — M5416 Radiculopathy, lumbar region: Secondary | ICD-10-CM | POA: Diagnosis not present

## 2014-04-29 ENCOUNTER — Other Ambulatory Visit: Payer: Self-pay | Admitting: Physician Assistant

## 2014-05-05 ENCOUNTER — Ambulatory Visit (INDEPENDENT_AMBULATORY_CARE_PROVIDER_SITE_OTHER): Payer: Medicare Other | Admitting: *Deleted

## 2014-05-05 DIAGNOSIS — I480 Paroxysmal atrial fibrillation: Secondary | ICD-10-CM | POA: Diagnosis not present

## 2014-05-05 DIAGNOSIS — I214 Non-ST elevation (NSTEMI) myocardial infarction: Secondary | ICD-10-CM | POA: Diagnosis not present

## 2014-05-05 LAB — POCT INR: INR: 1.2

## 2014-05-12 ENCOUNTER — Ambulatory Visit (INDEPENDENT_AMBULATORY_CARE_PROVIDER_SITE_OTHER): Payer: Medicare Other

## 2014-05-12 DIAGNOSIS — I480 Paroxysmal atrial fibrillation: Secondary | ICD-10-CM

## 2014-05-12 DIAGNOSIS — I4891 Unspecified atrial fibrillation: Secondary | ICD-10-CM | POA: Diagnosis not present

## 2014-05-12 DIAGNOSIS — I214 Non-ST elevation (NSTEMI) myocardial infarction: Secondary | ICD-10-CM | POA: Diagnosis not present

## 2014-05-12 LAB — POCT INR: INR: 1.2

## 2014-05-13 ENCOUNTER — Telehealth: Payer: Self-pay | Admitting: Cardiovascular Disease

## 2014-05-13 NOTE — Telephone Encounter (Signed)
Spoke with pt. I told her I have located fax from cardiac rehab for maintenance program and let her know Dr. Acie Fredrickson would be back in office tomorrow and could complete at that time.

## 2014-05-13 NOTE — Telephone Encounter (Signed)
New Message  Pt called states that she is trying to get in the heart maintence program. She reports a letter was sent on 01/04, and their office has sent 3 letters since with no response. She notes that, until the fax is returned to the program for heart maintenance she cannot get signed up/ Please call back to discuss.

## 2014-05-14 NOTE — Telephone Encounter (Signed)
Cardiac rehab order signed by Dr. Acie Fredrickson and placed in HIM for faxing

## 2014-05-19 ENCOUNTER — Ambulatory Visit (INDEPENDENT_AMBULATORY_CARE_PROVIDER_SITE_OTHER): Payer: Medicare Other

## 2014-05-19 DIAGNOSIS — I214 Non-ST elevation (NSTEMI) myocardial infarction: Secondary | ICD-10-CM | POA: Diagnosis not present

## 2014-05-19 DIAGNOSIS — I4891 Unspecified atrial fibrillation: Secondary | ICD-10-CM

## 2014-05-19 DIAGNOSIS — I480 Paroxysmal atrial fibrillation: Secondary | ICD-10-CM | POA: Diagnosis not present

## 2014-05-19 LAB — POCT INR: INR: 1.3

## 2014-05-26 ENCOUNTER — Encounter (HOSPITAL_COMMUNITY): Payer: Self-pay

## 2014-05-26 ENCOUNTER — Ambulatory Visit (INDEPENDENT_AMBULATORY_CARE_PROVIDER_SITE_OTHER): Payer: Medicare Other | Admitting: *Deleted

## 2014-05-26 DIAGNOSIS — Z5189 Encounter for other specified aftercare: Secondary | ICD-10-CM | POA: Insufficient documentation

## 2014-05-26 DIAGNOSIS — I214 Non-ST elevation (NSTEMI) myocardial infarction: Secondary | ICD-10-CM | POA: Diagnosis not present

## 2014-05-26 DIAGNOSIS — I4891 Unspecified atrial fibrillation: Secondary | ICD-10-CM | POA: Diagnosis not present

## 2014-05-26 DIAGNOSIS — I480 Paroxysmal atrial fibrillation: Secondary | ICD-10-CM | POA: Diagnosis not present

## 2014-05-26 DIAGNOSIS — I252 Old myocardial infarction: Secondary | ICD-10-CM | POA: Insufficient documentation

## 2014-05-26 DIAGNOSIS — Z955 Presence of coronary angioplasty implant and graft: Secondary | ICD-10-CM | POA: Insufficient documentation

## 2014-05-26 LAB — POCT INR: INR: 2.4

## 2014-05-27 DIAGNOSIS — M545 Low back pain: Secondary | ICD-10-CM | POA: Diagnosis not present

## 2014-05-27 DIAGNOSIS — M5416 Radiculopathy, lumbar region: Secondary | ICD-10-CM | POA: Diagnosis not present

## 2014-05-27 DIAGNOSIS — M4692 Unspecified inflammatory spondylopathy, cervical region: Secondary | ICD-10-CM | POA: Diagnosis not present

## 2014-05-27 DIAGNOSIS — M542 Cervicalgia: Secondary | ICD-10-CM | POA: Diagnosis not present

## 2014-05-28 ENCOUNTER — Encounter (HOSPITAL_COMMUNITY): Payer: Self-pay

## 2014-05-28 DIAGNOSIS — N3010 Interstitial cystitis (chronic) without hematuria: Secondary | ICD-10-CM | POA: Diagnosis not present

## 2014-05-28 DIAGNOSIS — J301 Allergic rhinitis due to pollen: Secondary | ICD-10-CM | POA: Diagnosis not present

## 2014-05-28 DIAGNOSIS — I482 Chronic atrial fibrillation: Secondary | ICD-10-CM | POA: Diagnosis not present

## 2014-05-28 DIAGNOSIS — M8580 Other specified disorders of bone density and structure, unspecified site: Secondary | ICD-10-CM | POA: Diagnosis not present

## 2014-05-28 DIAGNOSIS — K219 Gastro-esophageal reflux disease without esophagitis: Secondary | ICD-10-CM | POA: Diagnosis not present

## 2014-05-28 DIAGNOSIS — K5900 Constipation, unspecified: Secondary | ICD-10-CM | POA: Diagnosis not present

## 2014-05-28 DIAGNOSIS — I5030 Unspecified diastolic (congestive) heart failure: Secondary | ICD-10-CM | POA: Diagnosis not present

## 2014-05-28 DIAGNOSIS — Z0001 Encounter for general adult medical examination with abnormal findings: Secondary | ICD-10-CM | POA: Diagnosis not present

## 2014-05-28 DIAGNOSIS — I2510 Atherosclerotic heart disease of native coronary artery without angina pectoris: Secondary | ICD-10-CM | POA: Diagnosis not present

## 2014-05-28 DIAGNOSIS — E782 Mixed hyperlipidemia: Secondary | ICD-10-CM | POA: Diagnosis not present

## 2014-05-28 DIAGNOSIS — I69993 Ataxia following unspecified cerebrovascular disease: Secondary | ICD-10-CM | POA: Diagnosis not present

## 2014-05-28 DIAGNOSIS — I110 Hypertensive heart disease with heart failure: Secondary | ICD-10-CM | POA: Diagnosis not present

## 2014-05-29 ENCOUNTER — Encounter: Payer: Self-pay | Admitting: Cardiovascular Disease

## 2014-05-30 ENCOUNTER — Encounter (HOSPITAL_COMMUNITY): Payer: Self-pay

## 2014-06-02 ENCOUNTER — Encounter (HOSPITAL_COMMUNITY)
Admission: RE | Admit: 2014-06-02 | Discharge: 2014-06-02 | Disposition: A | Discharge status: Home / Self Care | Payer: Self-pay | Source: Ambulatory Visit | Attending: Cardiovascular Disease | Admitting: Cardiovascular Disease

## 2014-06-04 ENCOUNTER — Encounter (HOSPITAL_COMMUNITY)
Admission: RE | Admit: 2014-06-04 | Discharge: 2014-06-04 | Disposition: A | Discharge status: Home / Self Care | Payer: Self-pay | Source: Ambulatory Visit | Attending: Cardiovascular Disease | Admitting: Cardiovascular Disease

## 2014-06-04 ENCOUNTER — Ambulatory Visit (INDEPENDENT_AMBULATORY_CARE_PROVIDER_SITE_OTHER): Payer: Medicare Other | Admitting: *Deleted

## 2014-06-04 DIAGNOSIS — I214 Non-ST elevation (NSTEMI) myocardial infarction: Secondary | ICD-10-CM | POA: Diagnosis not present

## 2014-06-04 DIAGNOSIS — I4891 Unspecified atrial fibrillation: Secondary | ICD-10-CM

## 2014-06-04 DIAGNOSIS — I480 Paroxysmal atrial fibrillation: Secondary | ICD-10-CM

## 2014-06-04 LAB — POCT INR: INR: 3.8

## 2014-06-05 ENCOUNTER — Ambulatory Visit: Payer: Medicare Other | Admitting: Podiatry

## 2014-06-06 ENCOUNTER — Encounter (HOSPITAL_COMMUNITY): Payer: Self-pay

## 2014-06-09 ENCOUNTER — Encounter (HOSPITAL_COMMUNITY)
Admission: RE | Admit: 2014-06-09 | Discharge: 2014-06-09 | Disposition: A | Discharge status: Home / Self Care | Payer: Self-pay | Source: Ambulatory Visit | Attending: Cardiovascular Disease | Admitting: Cardiovascular Disease

## 2014-06-11 ENCOUNTER — Encounter (HOSPITAL_COMMUNITY)
Admission: RE | Admit: 2014-06-11 | Discharge: 2014-06-11 | Disposition: A | Discharge status: Home / Self Care | Payer: Self-pay | Source: Ambulatory Visit | Attending: Cardiovascular Disease | Admitting: Cardiovascular Disease

## 2014-06-11 DIAGNOSIS — Z5189 Encounter for other specified aftercare: Secondary | ICD-10-CM | POA: Insufficient documentation

## 2014-06-11 DIAGNOSIS — Z955 Presence of coronary angioplasty implant and graft: Secondary | ICD-10-CM | POA: Insufficient documentation

## 2014-06-11 DIAGNOSIS — I252 Old myocardial infarction: Secondary | ICD-10-CM | POA: Insufficient documentation

## 2014-06-13 ENCOUNTER — Ambulatory Visit (INDEPENDENT_AMBULATORY_CARE_PROVIDER_SITE_OTHER): Payer: Medicare Other | Admitting: *Deleted

## 2014-06-13 ENCOUNTER — Other Ambulatory Visit: Payer: Self-pay | Admitting: Family Medicine

## 2014-06-13 ENCOUNTER — Encounter (HOSPITAL_COMMUNITY): Payer: Self-pay

## 2014-06-13 DIAGNOSIS — R1013 Epigastric pain: Secondary | ICD-10-CM | POA: Diagnosis not present

## 2014-06-13 DIAGNOSIS — I4891 Unspecified atrial fibrillation: Secondary | ICD-10-CM | POA: Diagnosis not present

## 2014-06-13 DIAGNOSIS — I214 Non-ST elevation (NSTEMI) myocardial infarction: Secondary | ICD-10-CM

## 2014-06-13 DIAGNOSIS — I480 Paroxysmal atrial fibrillation: Secondary | ICD-10-CM

## 2014-06-13 DIAGNOSIS — R079 Chest pain, unspecified: Secondary | ICD-10-CM | POA: Diagnosis not present

## 2014-06-13 LAB — POCT INR: INR: 2.8

## 2014-06-16 ENCOUNTER — Encounter (HOSPITAL_COMMUNITY): Payer: Self-pay

## 2014-06-17 ENCOUNTER — Other Ambulatory Visit: Payer: Self-pay | Admitting: Family Medicine

## 2014-06-17 ENCOUNTER — Ambulatory Visit
Admission: RE | Admit: 2014-06-17 | Discharge: 2014-06-17 | Disposition: A | Discharge status: Home / Self Care | Payer: Medicare Other | Source: Ambulatory Visit | Attending: Family Medicine | Admitting: Family Medicine

## 2014-06-17 DIAGNOSIS — K219 Gastro-esophageal reflux disease without esophagitis: Secondary | ICD-10-CM | POA: Diagnosis not present

## 2014-06-17 DIAGNOSIS — R1013 Epigastric pain: Secondary | ICD-10-CM

## 2014-06-18 ENCOUNTER — Encounter (HOSPITAL_COMMUNITY): Payer: Self-pay

## 2014-06-20 ENCOUNTER — Telehealth: Payer: Self-pay | Admitting: Gastroenterology

## 2014-06-20 ENCOUNTER — Ambulatory Visit (INDEPENDENT_AMBULATORY_CARE_PROVIDER_SITE_OTHER): Payer: Medicare Other | Admitting: Cardiovascular Disease

## 2014-06-20 ENCOUNTER — Encounter: Payer: Self-pay | Admitting: Cardiovascular Disease

## 2014-06-20 ENCOUNTER — Encounter (HOSPITAL_COMMUNITY): Payer: Self-pay

## 2014-06-20 VITALS — BP 120/68 | HR 52 | Ht 61.0 in | Wt 150.0 lb

## 2014-06-20 DIAGNOSIS — R079 Chest pain, unspecified: Secondary | ICD-10-CM | POA: Diagnosis not present

## 2014-06-20 DIAGNOSIS — Z954 Presence of other heart-valve replacement: Secondary | ICD-10-CM

## 2014-06-20 DIAGNOSIS — I4891 Unspecified atrial fibrillation: Secondary | ICD-10-CM

## 2014-06-20 DIAGNOSIS — I214 Non-ST elevation (NSTEMI) myocardial infarction: Secondary | ICD-10-CM | POA: Diagnosis not present

## 2014-06-20 DIAGNOSIS — Z952 Presence of prosthetic heart valve: Secondary | ICD-10-CM

## 2014-06-20 DIAGNOSIS — I10 Essential (primary) hypertension: Secondary | ICD-10-CM

## 2014-06-20 NOTE — Progress Notes (Signed)
Cardiology Office Note   Date:  06/20/2014   ID:  Brittany [name], DOB 11/14/1927, MRN [id number]  PCP:  Mayra Neer, MD  Cardiologist:   Thayer Headings, MD   Chief Complaint  Patient presents with  . Follow-up    chest discomfort   1. Aortic valve replacement 2. Dyslipidemia 3. Hypertension 4. 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  5. Atrial fibrillation  History of Present Illness:  Brittany [name] is an 81 yo with aortic valve replacement, HTN, and hyperlipidemia. She has not had any cardiac complaints recently. She complains of easy bruising on her arms.   She had an episode of severe heart burn this past week.   She's had a stroke over the past year. This has left her with some balance issues and some loss of peripheral vision.  Nov. 4, 2014:  Brittany [name] is doing well from a cardiac standpoint. She is having problems with interstitial cystitis. She had a procedure last week and had a small bladder tear which they are letting heal. She is still having some significant pain.   Aug 27, 2013:  Brittany [name] is doing OK. She is not having any problems . Still very active.  She had an AVR   She is having problems with interstitial cyctitis.   November 06, 2013:  Brittany [name] was admitted to the hospital twice on July , 2015: DC summary :  81 year old Caucasian female with past medical history significant for hypertension, hyperlipidemia, history of CVA permanent atrial fibrillation on Xarelto for the last 2 years, and history of aortic valve replacement with bioprosthetic valve in 2005, here with chest pain. She was recently admitted to the hospital for management of malignant HTN , diastolic HF and headache. She was seen by Dr Haroldine Laws and [place] on 10/16/2013. She returns within 24 hrs for symptoms of chest pain . Pt states that earlier in the afternoon while she was trying to sleep post lunch she started experiencing soreness , heaviness "someone sitting on  my chest" on the left side radiating to left arm with associated mild SOB. This improved with SL NTG , however, she continues to have some chest pain. Pt denied any orthopnea, PND , LE edema , syncope, claudication , palpitation etc . She reports medication compliance. She bruises easily in her skin and once episodes of epistaxis several months ago.  She was admitted. Xarelto was held in anticipation of coronary angiography. ASA, statin, BB, IV NTG and heparin added. She ruled in for NSTEMI with troponin of 0.63. Left heart cath revealed severe, fibrotic disease in the ostial right coronary artery which was the culprit lesion. This was successfully stented with a 2.5 x 14 resolute drug-eluting stent, postdilated to 3.3 mm in diameter. She was started on plavix, however, a P2Y12 came back at 289 so she was started on Brilinta. Cardizem cd 120 was added for afib RVR with improvement. Due to mild cough Lisinopril was changed to irbesartan. Xarelto was changed to coumadin and she will follow up two days after DC in the coumadin clinic   She now presents for follow up . She has some MSK pain ( hurts when she presses her chest. She has had some bleeding. She woke up in the morning with a small skin tear on her right ankle . There was lots of blood in the bed. The bleeding eventually stopped with pressure. She also has had a nose bleed.   She has not had  any chest pain and her energy levels have been better. INR levels have been OK.   Oct. 29, 2015:  Brittany.  [name] feeling better. She had a bad viral illness last week and thinks that she might have the flu. She is getting better.  She's had some problems with anemia and she is turned in 3 sets of guaiac cards. She has had some dyspnea and was started on Lasix on a QOD schedule.  She is now off Brilinta. She had a major bleeding from he right foot. She was on brilinta because She had an inadequate response to plavix originally but later we  discovered that she was also on Omeprazole. We have stopped the omeprazone and her PRU is better. She is taking Nexium instead.    Off Eliquis. Is back on warfarin. doing well     June 20, 2014: : Brittany [name] is a 81 y.o. female who presents for follow up .  She has had prolonged nausea and abdominal pain.  Worse with eating. No cardiac symptoms .    Past Medical History  Diagnosis Date  . Dyslipidemia     takes Lipitor daily  . Arthritis   . Urine incontinence   . Diverticulosis   . IC (interstitial cystitis)   . S/P aortic valve replacement     2005  . H/O hiatal hernia   . History of TIA (transient ischemic attack)     2013-- RESIDUAL PERIPHERAL VISION RIGHT EYE--  RESOLVED  . History of bacterial endocarditis   . Foot drop     SINCE 1987  . History of gastritis   . Chronic back pain   . Hypertension   . Atrial fibrillation   . GERD (gastroesophageal reflux disease)     Past Surgical History  Procedure Laterality Date  . Abdominal hysterectomy  1967  . Aortic valve replacement  09-29-2003     [facility] pericardial tissue valve  . Cholecystectomy  1993  . Vein ligation  1969    RIGHT LOWER LEG  . Dilation and curettage of uterus    . Ercp N/A 08/10/2012    Procedure: ENDOSCOPIC RETROGRADE CHOLANGIOPANCREATOGRAPHY (ERCP);  Surgeon: Inda Castle, MD;  Location: [place];  Service: Gastroenterology;  Laterality: N/A;  . Cataract extraction w/ intraocular lens  implant, bilateral    . Appendectomy  1933  . Cardiac catheterization  07-15-2003 DR HELEN PRESTON    MODERATE TO MODERATELY SEVERE CALCIFIC AORTIC STENOSIS/  NORMAL CORONARY ARTERIES  . Right shoulder arthroscopy w/ debridement rotator cuff and labral tear/ acrominoplasty/ distal clavicle excision/ ca ligament release  10-08-1999  . Cysto/ hydrodistention/ instillation clorpactin  MULTIPLE  last one 2009  . Mini-open right rotator cuff repair  07-12-2001  . Transvaginal tape procedure   07-30-2002  . Lumbar disc surgery  1985 &  2007  . Macroplastique urethral implantation  08-27-2009  . Transthoracic echocardiogram  08-10-2011    MILD LVH/  EF 55-60%/  NORMAL AVR TISSUE/ MILD MR/  MODERATE DILATED LA/  MILD DILATED RA  . Cardiovascular stress test  01-30-2012  DR NASHER    NORMAL NUCLEAR STUDY/  EF 73%/  NORMAL LVF  . Tonsillectomy and adenoidectomy    . Carpal tunnel release Bilateral   . Cysto with hydrodistension N/A 02/05/2013    Procedure: CYSTOSCOPY/HYDRODISTENSION;  Surgeon: Irine Seal, MD;  Location: [facility];  Service: Urology;  Laterality: N/A;  . Left heart catheterization with coronary angiogram N/A 10/18/2013  Procedure: LEFT HEART CATHETERIZATION WITH CORONARY ANGIOGRAM;  Surgeon: Jettie Booze, MD;  Location: [facility] CATH LAB;  Service: Cardiovascular;  Laterality: N/A;     Current Outpatient Prescriptions  Medication Sig Dispense Refill  . atenolol (TENORMIN) 25 MG tablet TAKE 1 TABLET (25 MG TOTAL) BY MOUTH DAILY. 30 tablet 4  . atorvastatin (LIPITOR) 20 MG tablet Take 20 mg by mouth daily.     Marland Kitchen CARTIA XT 120 MG 24 hr capsule TAKE 1 TABLET (120 MG TOTAL) BY MOUTH DAILY. 30 capsule 4  . cholecalciferol (VITAMIN D) 1000 UNITS tablet Take 1,000 Units by mouth daily.    . clopidogrel (PLAVIX) 75 MG tablet Take 1 tablet (75 mg total) by mouth daily. 30 tablet 11  . diazepam (VALIUM) 5 MG tablet every 12 (twelve) hours as needed for muscle spasms.     . furosemide (LASIX) 40 MG tablet Take 1 tablet (40 mg total) by mouth as directed. TAKE 40 MG ON TUES, THUR, SAT, AND SUN (Patient taking differently: Take 40 mg by mouth as needed (Per pt, taking as needed.). TAKE 40 MG ON TUES, THUR, SAT, AND SUN) 30 tablet 11  . HYDROcodone-acetaminophen (NORCO/VICODIN) 5-325 MG per tablet 1 tablet every 6 (six) hours as needed for moderate pain.     . hyoscyamine (ANASPAZ) 0.125 MG TBDP disintergrating tablet Place 0.125 mg under the tongue as needed for  bladder spasms.    Marland Kitchen lisinopril (PRINIVIL,ZESTRIL) 20 MG tablet Take 1 tablet (20 mg total) by mouth daily. Pt takes 20mg  two tabs daily 30 tablet 11  . Omega-3 Fatty Acids (FISH OIL PO) Take 1 g by mouth daily.     Marland Kitchen omeprazole (PRILOSEC) 40 MG capsule Take 40 mg by mouth daily.    Marland Kitchen oxybutynin (DITROPAN) 5 MG tablet Take 5 mg by mouth 3 (three) times daily.    . Potassium Chloride ER 20 MEQ TBCR Take 10 mEq by mouth daily. (Patient taking differently: Take 20 mEq by mouth as needed (Per pt taking as needed with Furosemide.). ) 30 tablet 11  . promethazine (PHENERGAN) 25 MG tablet TAKE 1 TABLET (25 MG TOTAL) BY MOUTH EVERY 6 (SIX) HOURS AS NEEDED FOR NAUSEA OR VOMITING. 30 tablet 3  . warfarin (COUMADIN) 5 MG tablet Take 1.5 tablets (7.5 mg total) by mouth daily at 6 PM. (Patient taking differently: Take 7.5 mg by mouth daily at 6 PM. Patient states that she takes 1 tablet by mouth Monday thur Saturday and 1/2 tablet on Sunday.) 30 tablet 5  . zolpidem (AMBIEN) 10 MG tablet Take 10 mg by mouth at bedtime as needed for sleep.     No current facility-administered medications for this visit.    Allergies:   Amoxicillin; Cephalexin; Codeine; Irbesartan; Morphine and related; Neurontin; Nitrofurantoin monohyd macro; Prednisone; and Sulfa antibiotics    Social History:  The patient  reports that she quit smoking about 24 years ago. Her smoking use included Cigarettes. She has never used smokeless tobacco. She reports that she does not drink alcohol or use illicit drugs.   Family History:  The patient's family history includes Alcohol abuse in her sister and sister; Bipolar disorder in her brother; Colon cancer in her maternal aunt; Coronary artery disease in her brother; Heart disease in her father; Stomach cancer in her maternal grandmother.    ROS:  Please see the history of present illness.    Review of Systems: Constitutional:  admits to  , appetite change    HEENT: denies  photophobia, eye  pain, redness, hearing loss, ear pain, congestion, sore throat, rhinorrhea, sneezing, neck pain, neck stiffness and tinnitus.  Respiratory: admits to SOB, DOE with activity , cough, chest tightness, and wheezing.  Cardiovascular: denies chest pain, palpitations and leg swelling.  Gastrointestinal: admits to nausea, vomiting, abdominal pain,   Genitourinary: denies dysuria, urgency, frequency, hematuria, flank pain and difficulty urinating.  Musculoskeletal: admits to   back pain,     Skin: denies pallor, rash and wound.  Neurological: denies dizziness, seizures, syncope, weakness, light-headedness, numbness and headaches.   Hematological: admits to   easy bruising,   Psychiatric/ Behavioral: denies suicidal ideation, mood changes, confusion, nervousness, sleep disturbance and agitation.       All other systems are reviewed and negative.    PHYSICAL EXAM: VS:  BP 120/68 mmHg  Pulse 52  Ht 5\' 1"  (1.549 m)  Wt 150 lb (68.04 kg)  BMI 28.36 kg/m2 , BMI Body mass index is 28.36 kg/(m^2). GEN: Well nourished, well developed, in no acute distress HEENT: normal Neck: no JVD, carotid bruits, or masses Cardiac: Irreg.. Irreg. ; no murmurs, rubs, or gallops,no edema  Respiratory:  clear to auscultation bilaterally, normal work of breathing GI: soft, nontender, nondistended, + BS Brittany: no deformity or atrophy Skin: warm and dry, no rash Neuro:  Strength and sensation are intact Psych: normal   EKG:  EKG is ordered today. The ekg ordered today demonstrates atrial fibrillation with a heart rate of 52. She has no ST or T wave changes.   Recent Labs: 10/10/2013: ALT 20 01/20/2014: BUN 16; Creatinine 0.7; Potassium 4.5; Pro B Natriuretic peptide (BNP) 357.0*; Sodium 134* 01/22/2014: Hemoglobin 9.9*; Platelets 292.0    Lipid Panel    Component Value Date/Time   CHOL 159 10/12/2013 0525   TRIG 53 10/12/2013 0525   HDL 62 10/12/2013 0525   CHOLHDL 2.6 10/12/2013 0525   VLDL 11 10/12/2013  0525   LDLCALC 86 10/12/2013 0525      Wt Readings from Last 3 Encounters:  06/20/14 150 lb (68.04 kg)  02/06/14 148 lb 9.6 oz (67.405 kg)  01/20/14 148 lb (67.132 kg)      Other studies Reviewed: Additional studies/ records that were reviewed today include: records from medical doctor. Review of the above records demonstrates: several months of abdominal pain    ASSESSMENT AND PLAN:  1. Aortic valve replacement - Mrs. Brittingham is doing well. We'll continue the current medications. She's on Coumadin for her atrial fibrillation.  2. Dyslipidemia- continue current dose of atorvastatin.  3. Hypertension- blood pressure is well-controlled.  4. 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  5. Atrial fibrillation- very stable. Continue Coumadin. Her rate is slow but she is stable and not having any syncope.   Current medicines are reviewed at length with the patient today.  The patient does not have concerns regarding medicines.  The following changes have been made:  no change   Disposition:   FU with me in 6 months     Signed, Jorryn Hershberger, Wonda Cheng, MD  06/20/2014 8:16 AM    [facility] [place], [place], [place]  [address] Phone: [phone]; Fax: [phone]

## 2014-06-20 NOTE — Patient Instructions (Signed)
Your physician recommends that you continue on your current medications as directed. Please refer to the Current Medication list given to you today.  Your physician wants you to follow-up in: 6 months with Dr. Nahser.  You will receive a reminder letter in the mail two months in advance. If you don't receive a letter, please call our office to schedule the follow-up appointment.  

## 2014-06-23 ENCOUNTER — Telehealth: Payer: Self-pay | Admitting: Gastroenterology

## 2014-06-23 ENCOUNTER — Ambulatory Visit (INDEPENDENT_AMBULATORY_CARE_PROVIDER_SITE_OTHER): Payer: Medicare Other | Admitting: Physician Assistant

## 2014-06-23 ENCOUNTER — Other Ambulatory Visit: Payer: Self-pay

## 2014-06-23 ENCOUNTER — Encounter: Payer: Self-pay | Admitting: Physician Assistant

## 2014-06-23 ENCOUNTER — Telehealth: Payer: Self-pay

## 2014-06-23 ENCOUNTER — Encounter (HOSPITAL_COMMUNITY): Payer: Self-pay

## 2014-06-23 ENCOUNTER — Other Ambulatory Visit (INDEPENDENT_AMBULATORY_CARE_PROVIDER_SITE_OTHER): Payer: Medicare Other

## 2014-06-23 VITALS — BP 100/60 | HR 68 | Ht 61.0 in | Wt 145.8 lb

## 2014-06-23 DIAGNOSIS — R1010 Upper abdominal pain, unspecified: Secondary | ICD-10-CM

## 2014-06-23 DIAGNOSIS — R630 Anorexia: Secondary | ICD-10-CM

## 2014-06-23 DIAGNOSIS — I214 Non-ST elevation (NSTEMI) myocardial infarction: Secondary | ICD-10-CM | POA: Diagnosis not present

## 2014-06-23 DIAGNOSIS — R7989 Other specified abnormal findings of blood chemistry: Secondary | ICD-10-CM

## 2014-06-23 DIAGNOSIS — R110 Nausea: Secondary | ICD-10-CM

## 2014-06-23 DIAGNOSIS — D72829 Elevated white blood cell count, unspecified: Secondary | ICD-10-CM

## 2014-06-23 LAB — CBC WITH DIFFERENTIAL/PLATELET
BASOS PCT: 0.5 % (ref 0.0–3.0)
Basophils Absolute: 0.1 10*3/uL (ref 0.0–0.1)
EOS ABS: 0.3 10*3/uL (ref 0.0–0.7)
Eosinophils Relative: 2.4 % (ref 0.0–5.0)
HCT: 37.7 % (ref 36.0–46.0)
Hemoglobin: 12.3 g/dL (ref 12.0–15.0)
LYMPHS PCT: 18.9 % (ref 12.0–46.0)
Lymphs Abs: 2.4 10*3/uL (ref 0.7–4.0)
MCHC: 32.7 g/dL (ref 30.0–36.0)
MCV: 75.7 fl — ABNORMAL LOW (ref 78.0–100.0)
MONO ABS: 1.7 10*3/uL — AB (ref 0.1–1.0)
Monocytes Relative: 13.1 % — ABNORMAL HIGH (ref 3.0–12.0)
NEUTROS ABS: 8.3 10*3/uL — AB (ref 1.4–7.7)
NEUTROS PCT: 65.1 % (ref 43.0–77.0)
Platelets: 226 10*3/uL (ref 150.0–400.0)
RBC: 4.98 Mil/uL (ref 3.87–5.11)
RDW: 18.6 % — AB (ref 11.5–15.5)
WBC: 12.8 10*3/uL — ABNORMAL HIGH (ref 4.0–10.5)

## 2014-06-23 LAB — COMPREHENSIVE METABOLIC PANEL
ALBUMIN: 3.9 g/dL (ref 3.5–5.2)
ALT: 19 U/L (ref 0–35)
AST: 24 U/L (ref 0–37)
Alkaline Phosphatase: 89 U/L (ref 39–117)
BUN: 18 mg/dL (ref 6–23)
CO2: 30 mEq/L (ref 19–32)
Calcium: 9.1 mg/dL (ref 8.4–10.5)
Chloride: 95 mEq/L — ABNORMAL LOW (ref 96–112)
Creatinine, Ser: 0.91 mg/dL (ref 0.40–1.20)
GFR: 62.43 mL/min (ref >60.00)
GLUCOSE: 76 mg/dL (ref 70–99)
POTASSIUM: 5 meq/L (ref 3.5–5.1)
Sodium: 128 mEq/L — ABNORMAL LOW (ref 135–145)
TOTAL PROTEIN: 7.6 g/dL (ref 6.0–8.3)
Total Bilirubin: 0.5 mg/dL (ref 0.2–1.2)

## 2014-06-23 LAB — LIPASE: Lipase: 31 U/L (ref 11.0–59.0)

## 2014-06-23 LAB — AMYLASE: Amylase: 33 U/L (ref 27–131)

## 2014-06-23 MED ORDER — ONDANSETRON HCL 8 MG PO TABS: 8.0000 mg | ORAL_TABLET | Freq: Three times a day (TID) | ORAL | Status: DC | PRN

## 2014-06-23 NOTE — Patient Instructions (Addendum)
Your physician has requested that you go to the basement for lab work before leaving today  You have been scheduled for a CT scan of the abdomen and pelvis at [facility] ([address]---this is in the same building as Press [facility]).   You are scheduled on 06/24/2014 at 9:30am. You should arrive 15 minutes prior to your appointment time for registration. Please follow the written instructions below on the day of your exam:  WARNING: IF YOU ARE ALLERGIC TO IODINE/X-RAY DYE, PLEASE NOTIFY RADIOLOGY IMMEDIATELY AT [phone]! YOU WILL BE GIVEN A 13 HOUR PREMEDICATION PREP.  1) Do not eat or drink anything after 5:30am (4 hours prior to your test) 2) You have been given 2 bottles of oral contrast to drink. The solution may taste better if refrigerated, but do NOT add ice or any other liquid to this solution. Shake well before drinking.    Drink 1 bottle of contrast @ 7:30am (2 hours prior to your exam)  Drink 1 bottle of contrast @ 8:30am (1 hour prior to your exam)  You may take any medications as prescribed with a small amount of water except for the following: Metformin, Glucophage, Glucovance, Avandamet, Riomet, Fortamet, Actoplus Met, Janumet, Glumetza or Metaglip. The above medications must be held the day of the exam AND 48 hours after the exam.  The purpose of you drinking the oral contrast is to aid in the visualization of your intestinal tract. The contrast solution may cause some diarrhea. Before your exam is started, you will be given a small amount of fluid to drink. Depending on your individual set of symptoms, you may also receive an intravenous injection of x-ray contrast/dye. Plan on being at [facility] for 30 minutes or long, depending on the type of exam you are having performed.  If you have any questions regarding your exam or if you need to reschedule, you may call the CT department at [phone] between the hours of 8:00 am and 5:00 pm,  Monday-Friday.   Have a clear liquid diet for 24 hours and then a low fat diet.  We have sent the following medications to your pharmacy for you to pick up at your convenience:  Zofran   ________________________________________________________________________

## 2014-06-23 NOTE — Telephone Encounter (Signed)
BMET added for tomorrow.

## 2014-06-23 NOTE — Telephone Encounter (Signed)
Appointment moved to today. Patient wants to be seen today. Abdominal pain.

## 2014-06-23 NOTE — Telephone Encounter (Signed)
-----   Message from Vita Barley Hvozdovic, PA-C sent at 06/23/2014  4:06 PM EDT ----- Vaughan Basta, on this pt--she was the lady to call to have cbc before CT tomorrow. Her sodium is low, can you add a BMET please?

## 2014-06-23 NOTE — Progress Notes (Signed)
Patient ID: Brittany [name], female   DOB: [birth year], 81 y.o.   MRN: [id number]     History of Present Illness: Brittany [name] is a pleasant 81 year old female who is followed by Dr. Deatra Ina. She has a history of hypertension, chronic diastolic heart failure, atrial fibrillation, aortic stenosis, status post NSTEMI, GERD, gastritis, ampullary stenosis, and is status post an aortic valve replacement. She had an ERCP with sphincterotomy/papillotomy and ERCP with balloon dilation in May 2014. 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 Large periampullary diverticulae.  Brittany [name] presents today with a 3-4 month history of epigastric pain and nausea she feels as if she wants to vomit but nothing will come up she has no dysphagia yet she states she feels her food fills her esophagus she has not been belching or burping she was getting heartburn and was started on twice a day omeprazole and daily at bedtime Zantac which has decrease the heartburn but she continues to have nausea. \Her nausea is all the time and not exacerbated or alleviated with ingestion of food. She feels her nausea has been getting worse for the past 6 or 7 weeks. Her appetite has been diminished but her weight has been stable. Her bowel movements are regular and she's had no melena. She has no bloating. Her stools are not oily. She has no radiation of the pain. She has not noticed any dark urine or clay-colored stools. She just states she has a constant heaviness across her chest and upper abdomen with an occasional ache between her low shoulder blades. She was sent for an upper GI series that revealed mild reflux. No abnormality of the stomach or duodenum was seen. She did have moderate tertiary contractions in the mid and distal esophagus.   Past Medical History  Diagnosis Date  . Dyslipidemia     takes Lipitor daily  . Arthritis    . Urine incontinence   . Diverticulosis   . IC (interstitial cystitis)   . S/P aortic valve replacement     2005  . H/O hiatal hernia   . History of TIA (transient ischemic attack)     2013-- RESIDUAL PERIPHERAL VISION RIGHT EYE--  RESOLVED  . History of bacterial endocarditis   . Foot drop     SINCE 1987  . History of gastritis   . Chronic back pain   . Hypertension   . Atrial fibrillation   . GERD (gastroesophageal reflux disease)     Past Surgical History  Procedure Laterality Date  . Abdominal hysterectomy  1967  . Aortic valve replacement  09-29-2003     [facility] pericardial tissue valve  . Cholecystectomy  1993  . Vein ligation  1969    RIGHT LOWER LEG  . Dilation and curettage of uterus    . Ercp N/A 08/10/2012    Procedure: ENDOSCOPIC RETROGRADE CHOLANGIOPANCREATOGRAPHY (ERCP);  Surgeon: Inda Castle, MD;  Location: [place];  Service: Gastroenterology;  Laterality: N/A;  . Cataract extraction w/ intraocular lens  implant, bilateral    . Appendectomy  1933  . Cardiac catheterization  07-15-2003 DR HELEN PRESTON    MODERATE TO MODERATELY SEVERE CALCIFIC AORTIC STENOSIS/  NORMAL CORONARY ARTERIES  . Right shoulder arthroscopy w/ debridement rotator cuff and labral tear/ acrominoplasty/ distal clavicle excision/ ca ligament release  10-08-1999  . Cysto/ hydrodistention/ instillation clorpactin  MULTIPLE  last one 2009  . Mini-open right rotator cuff  repair  07-12-2001  . Transvaginal tape procedure  07-30-2002  . Lumbar disc surgery  1985 &  2007  . Macroplastique urethral implantation  08-27-2009  . Transthoracic echocardiogram  08-10-2011    MILD LVH/  EF 55-60%/  NORMAL AVR TISSUE/ MILD MR/  MODERATE DILATED LA/  MILD DILATED RA  . Cardiovascular stress test  01-30-2012  DR NASHER    NORMAL NUCLEAR STUDY/  EF 73%/  NORMAL LVF  . Tonsillectomy and adenoidectomy    . Carpal tunnel release Bilateral   . Cysto with hydrodistension N/A 02/05/2013     Procedure: CYSTOSCOPY/HYDRODISTENSION;  Surgeon: Irine Seal, MD;  Location: [facility];  Service: Urology;  Laterality: N/A;  . Left heart catheterization with coronary angiogram N/A 10/18/2013    Procedure: LEFT HEART CATHETERIZATION WITH CORONARY ANGIOGRAM;  Surgeon: Jettie Booze, MD;  Location: [facility] CATH LAB;  Service: Cardiovascular;  Laterality: N/A;   Family History  Problem Relation Age of Onset  . Heart disease Father   . Coronary artery disease Brother   . Bipolar disorder Brother     commited suiside at 46yo  . Alcohol abuse Sister     sister #1  . Alcohol abuse Sister     sister #2  . Colon cancer Maternal Aunt   . Stomach cancer Maternal Grandmother    History  Substance Use Topics  . Smoking status: Former Smoker    Types: Cigarettes    Quit date: 03/06/1990  . Smokeless tobacco: Never Used  . Alcohol Use: No   Current Outpatient Prescriptions  Medication Sig Dispense Refill  . atenolol (TENORMIN) 25 MG tablet TAKE 1 TABLET (25 MG TOTAL) BY MOUTH DAILY. 30 tablet 4  . atorvastatin (LIPITOR) 20 MG tablet Take 20 mg by mouth daily.     Marland Kitchen CARTIA XT 120 MG 24 hr capsule TAKE 1 TABLET (120 MG TOTAL) BY MOUTH DAILY. 30 capsule 4  . cholecalciferol (VITAMIN D) 1000 UNITS tablet Take 1,000 Units by mouth daily.    . clopidogrel (PLAVIX) 75 MG tablet Take 1 tablet (75 mg total) by mouth daily. 30 tablet 11  . diazepam (VALIUM) 5 MG tablet every 12 (twelve) hours as needed for muscle spasms.     . furosemide (LASIX) 40 MG tablet Take 1 tablet (40 mg total) by mouth as directed. TAKE 40 MG ON TUES, THUR, SAT, AND SUN (Patient taking differently: Take 40 mg by mouth as needed (Per pt, taking as needed.). TAKE 40 MG ON TUES, THUR, SAT, AND SUN) 30 tablet 11  . HYDROcodone-acetaminophen (NORCO/VICODIN) 5-325 MG per tablet 1 tablet every 6 (six) hours as needed for moderate pain.     . hyoscyamine (ANASPAZ) 0.125 MG TBDP disintergrating tablet Place 0.125 mg under  the tongue as needed for bladder spasms.    Marland Kitchen lisinopril (PRINIVIL,ZESTRIL) 20 MG tablet Take 1 tablet (20 mg total) by mouth daily. Pt takes 20mg  two tabs daily 30 tablet 11  . Omega-3 Fatty Acids (FISH OIL PO) Take 1 g by mouth daily.     Marland Kitchen omeprazole (PRILOSEC) 40 MG capsule Take 40 mg by mouth daily.    Marland Kitchen oxybutynin (DITROPAN) 5 MG tablet Take 5 mg by mouth 3 (three) times daily.    . Potassium Chloride ER 20 MEQ TBCR Take 10 mEq by mouth daily. (Patient taking differently: Take 20 mEq by mouth as needed (Per pt taking as needed with Furosemide.). ) 30 tablet 11  . promethazine (PHENERGAN) 25 MG tablet  TAKE 1 TABLET (25 MG TOTAL) BY MOUTH EVERY 6 (SIX) HOURS AS NEEDED FOR NAUSEA OR VOMITING. 30 tablet 3  . ranitidine (ZANTAC) 150 MG tablet Take 150 mg by mouth at bedtime.    Marland Kitchen warfarin (COUMADIN) 5 MG tablet Take 1.5 tablets (7.5 mg total) by mouth daily at 6 PM. (Patient taking differently: Take 7.5 mg by mouth daily at 6 PM. Patient states that she takes 1 tablet by mouth Monday thur Saturday and 1/2 tablet on Sunday.) 30 tablet 5  . zolpidem (AMBIEN) 10 MG tablet Take 10 mg by mouth at bedtime as needed for sleep.    Marland Kitchen ondansetron (ZOFRAN) 8 MG tablet Take 1 tablet (8 mg total) by mouth every 8 (eight) hours as needed for nausea or vomiting. 15 tablet 0   No current facility-administered medications for this visit.   Allergies  Allergen Reactions  . Amoxicillin Diarrhea and Nausea And Vomiting  . Cephalexin Diarrhea  . Codeine Nausea Only  . Irbesartan Swelling    Facial and hand numbness and swelling. Patient believes reaction was with this medication but isn't 100% sure. She is currently tolerating lisinopril.  Marland Kitchen Morphine And Related Other (See Comments)    HX ADDICTION / WITHDRAWAL  . Neurontin [Gabapentin] Swelling    Legs swell  . Nitrofurantoin Monohyd Macro Diarrhea and Nausea Only  . Prednisone Nausea Only  . Sulfa Antibiotics Rash      Review of Systems: Gen: Denies  any fever, chills, sweats, anorexia, fatigue, weakness, malaise, weight loss, and sleep disorder CV: Denies chest pain, angina, palpitations, syncope, orthopnea, PND, peripheral edema, and claudication. Resp: Denies dyspnea at rest, dyspnea with exercise, cough, sputum, wheezing, coughing up blood, and pleurisy. GI: Nause x 4 months, anorexia x 5-6 weeks. GU : Denies urinary burning, blood in urine, urinary frequency, urinary hesitancy, nocturnal urination, and urinary incontinence. MS: Denies joint pain, limitation of movement, and swelling, stiffness, low back pain, extremity pain. Denies muscle weakness, cramps, atrophy.  Derm: Denies rash, itching, dry skin, hives, moles, warts, or unhealing ulcers.  Psych: Denies depression, anxiety, memory loss, suicidal ideation, hallucinations, paranoia, and confusion. Heme: Denies bruising, bleeding, and enlarged lymph nodes. Neuro:  Denies any headaches, dizziness, paresthesia Endo:  Denies any problems with DM, thyroid, adrena Studies:   Dg Ugi  W/kub  06/17/2014   CLINICAL DATA:  Epigastric pain  EXAM: UPPER GI SERIES WITH KUB  TECHNIQUE: After obtaining a scout radiograph a routine upper GI series was performed using thin barium  FLUOROSCOPY TIME:  Radiation Exposure Index (as provided by the fluoroscopic device): 67 grade per sq cm  If the device does not provide the exposure index:  Fluoroscopy Time (in minutes and seconds):  2 minutes 0 seconds  Number of Acquired Images:  COMPARISON:  None.  FINDINGS: A preliminary film of the abdomen shows a nonspecific bowel gas pattern. Surgical clips are present in the right upper quadrant from prior cholecystectomy. There are degenerative changes throughout the lumbar spine.  Initially rapid sequence spot films of the cervical esophagus were obtained. The swallowing mechanism is unremarkable. There are moderate tertiary contractions in the mid and distal esophagus. No hiatal hernia is seen. Mild gastroesophageal  reflux is demonstrated.  The stomach is normal in contour and peristalsis. The duodenal bulb fills and the duodenal loop is in normal position. A barium pill was given at the end of the study which passed into the stomach without delay.  IMPRESSION: 1. Mild gastroesophageal reflux. 2. No abnormality  of the stomach or duodenum is seen. 3. Moderate tertiary contractions in the mid and distal esophagus.   Electronically Signed   By: Ivar Drape M.D.   On: 06/17/2014 13:36     Physical Exam: General: Pleasant, elderly female in no acute distress Head: Normocephalic and atraumatic Eyes:  sclerae anicteric, conjunctiva pink  Ears: Normal auditory acuity Lungs: Clear throughout to auscultation Heart: irreg irreg Abdomen: Soft,mild diffuse tenderness, worse in epigastric area  No masses, no hepatomegaly. Normal bowel sounds Musculoskeletal: Symmetrical with no gross deformities  Extremities: No edema  Neurological: Alert oriented x 4, grossly nonfocal Psychological:  Alert and cooperative. Normal mood and affect  Assessment and Recommendations:  81 year old female with a four-month history of upper abdominal pain and nausea and a 5-6 week history of anorexia here for evaluation. His CBC, comprehensive metabolic panel, amylase, and lipase will be obtained as will a CT of the abdomen. She will adhere to a clear liquid diet for 24 hours, then advance to full liquids, then to low residue as tolerated. She will be given a trial of Zofran 8 mg 1 by mouth every 8 hours when necessary nausea. Follow-up recommendations will be made pending the findings of the above testing. If all are negative and she has no relief of her nausea she may be a candidate for repeat EGD.     Marjoria Mancillas, Deloris Ping 06/23/2014,

## 2014-06-24 ENCOUNTER — Other Ambulatory Visit (INDEPENDENT_AMBULATORY_CARE_PROVIDER_SITE_OTHER): Payer: Medicare Other

## 2014-06-24 ENCOUNTER — Ambulatory Visit (INDEPENDENT_AMBULATORY_CARE_PROVIDER_SITE_OTHER)
Admission: RE | Admit: 2014-06-24 | Discharge: 2014-06-24 | Disposition: A | Discharge status: Home / Self Care | Payer: Medicare Other | Source: Ambulatory Visit | Attending: Physician Assistant | Admitting: Physician Assistant

## 2014-06-24 ENCOUNTER — Telehealth: Payer: Self-pay | Admitting: Physician Assistant

## 2014-06-24 ENCOUNTER — Other Ambulatory Visit: Payer: Self-pay

## 2014-06-24 DIAGNOSIS — R7989 Other specified abnormal findings of blood chemistry: Secondary | ICD-10-CM | POA: Diagnosis not present

## 2014-06-24 DIAGNOSIS — I700 Atherosclerosis of aorta: Secondary | ICD-10-CM | POA: Diagnosis not present

## 2014-06-24 DIAGNOSIS — R630 Anorexia: Secondary | ICD-10-CM

## 2014-06-24 DIAGNOSIS — D72829 Elevated white blood cell count, unspecified: Secondary | ICD-10-CM | POA: Diagnosis not present

## 2014-06-24 DIAGNOSIS — R1010 Upper abdominal pain, unspecified: Secondary | ICD-10-CM | POA: Diagnosis not present

## 2014-06-24 DIAGNOSIS — R110 Nausea: Secondary | ICD-10-CM | POA: Diagnosis not present

## 2014-06-24 DIAGNOSIS — N200 Calculus of kidney: Secondary | ICD-10-CM | POA: Diagnosis not present

## 2014-06-24 DIAGNOSIS — R1084 Generalized abdominal pain: Secondary | ICD-10-CM

## 2014-06-24 LAB — CBC WITH DIFFERENTIAL/PLATELET
Basophils Absolute: 0 10*3/uL (ref 0.0–0.1)
Basophils Relative: 0.4 % (ref 0.0–3.0)
EOS PCT: 2.4 % (ref 0.0–5.0)
Eosinophils Absolute: 0.3 10*3/uL (ref 0.0–0.7)
HCT: 40.4 % (ref 36.0–46.0)
HEMOGLOBIN: 13.3 g/dL (ref 12.0–15.0)
Lymphocytes Relative: 22.2 % (ref 12.0–46.0)
Lymphs Abs: 3 10*3/uL (ref 0.7–4.0)
MCHC: 32.9 g/dL (ref 30.0–36.0)
MCV: 75.9 fl — ABNORMAL LOW (ref 78.0–100.0)
MONOS PCT: 9.3 % (ref 3.0–12.0)
Monocytes Absolute: 1.3 10*3/uL — ABNORMAL HIGH (ref 0.1–1.0)
NEUTROS PCT: 65.7 % (ref 43.0–77.0)
Neutro Abs: 9 10*3/uL — ABNORMAL HIGH (ref 1.4–7.7)
PLATELETS: 289 10*3/uL (ref 150.0–400.0)
RBC: 5.31 Mil/uL — ABNORMAL HIGH (ref 3.87–5.11)
RDW: 18.7 % — ABNORMAL HIGH (ref 11.5–15.5)
WBC: 13.6 10*3/uL — ABNORMAL HIGH (ref 4.0–10.5)

## 2014-06-24 LAB — BASIC METABOLIC PANEL
BUN: 13 mg/dL (ref 6–23)
CO2: 29 mEq/L (ref 19–32)
Calcium: 9.9 mg/dL (ref 8.4–10.5)
Chloride: 94 mEq/L — ABNORMAL LOW (ref 96–112)
Creatinine, Ser: 0.9 mg/dL (ref 0.40–1.20)
GFR: 63.23 mL/min (ref >60.00)
Glucose, Bld: 97 mg/dL (ref 70–99)
Potassium: 4.7 mEq/L (ref 3.5–5.1)
SODIUM: 131 meq/L — AB (ref 135–145)

## 2014-06-24 MED ORDER — CIPROFLOXACIN HCL 250 MG PO TABS: 250.0000 mg | ORAL_TABLET | Freq: Two times a day (BID) | ORAL | Status: DC

## 2014-06-24 MED ORDER — IOHEXOL 300 MG/ML  SOLN
100.0000 mL | Freq: Once | INTRAMUSCULAR | Status: AC | PRN
  Administered 2014-06-24: 100 mL via INTRAVENOUS

## 2014-06-24 NOTE — Telephone Encounter (Signed)
Patient has already spoken with Rosanne Sack, RN and has all the instructions and results

## 2014-06-24 NOTE — Progress Notes (Signed)
Reviewed and agree with management.  May also consider gastric empty scan pending results of above  Sandy Salaam. Deatra Ina, M.D., [facility]

## 2014-06-25 ENCOUNTER — Other Ambulatory Visit: Payer: Self-pay | Admitting: *Deleted

## 2014-06-25 ENCOUNTER — Ambulatory Visit (INDEPENDENT_AMBULATORY_CARE_PROVIDER_SITE_OTHER)
Admission: RE | Admit: 2014-06-25 | Discharge: 2014-06-25 | Disposition: A | Discharge status: Home / Self Care | Payer: Medicare Other | Source: Ambulatory Visit | Attending: Physician Assistant | Admitting: Physician Assistant

## 2014-06-25 ENCOUNTER — Telehealth: Payer: Self-pay | Admitting: Physician Assistant

## 2014-06-25 ENCOUNTER — Other Ambulatory Visit (INDEPENDENT_AMBULATORY_CARE_PROVIDER_SITE_OTHER): Payer: Medicare Other

## 2014-06-25 ENCOUNTER — Encounter (HOSPITAL_COMMUNITY): Payer: Self-pay

## 2014-06-25 DIAGNOSIS — D72829 Elevated white blood cell count, unspecified: Secondary | ICD-10-CM | POA: Diagnosis not present

## 2014-06-25 DIAGNOSIS — R1084 Generalized abdominal pain: Secondary | ICD-10-CM | POA: Diagnosis not present

## 2014-06-25 LAB — URINALYSIS, ROUTINE W REFLEX MICROSCOPIC
Bilirubin Urine: NEGATIVE
Ketones, ur: NEGATIVE
Leukocytes, UA: NEGATIVE
Nitrite: NEGATIVE
PH: 5.5 (ref 5.0–8.0)
Renal Epithel, UA: NONE SEEN
TOTAL PROTEIN, URINE-UPE24: NEGATIVE
Urine Glucose: NEGATIVE
Urobilinogen, UA: 0.2 (ref 0.0–1.0)

## 2014-06-25 NOTE — Telephone Encounter (Signed)
See results note. 

## 2014-06-26 LAB — URINE CULTURE: Colony Count: >=100000

## 2014-06-26 NOTE — Telephone Encounter (Signed)
Spoke with patient and told her the repeat lab is a CBC on Friday. She will come for this.

## 2014-06-27 ENCOUNTER — Other Ambulatory Visit: Payer: Self-pay | Admitting: Physician Assistant

## 2014-06-27 ENCOUNTER — Encounter (HOSPITAL_COMMUNITY): Payer: Self-pay

## 2014-06-27 ENCOUNTER — Other Ambulatory Visit (INDEPENDENT_AMBULATORY_CARE_PROVIDER_SITE_OTHER): Payer: Medicare Other

## 2014-06-27 DIAGNOSIS — R0602 Shortness of breath: Secondary | ICD-10-CM | POA: Diagnosis not present

## 2014-06-27 DIAGNOSIS — I2510 Atherosclerotic heart disease of native coronary artery without angina pectoris: Secondary | ICD-10-CM

## 2014-06-27 DIAGNOSIS — D72829 Elevated white blood cell count, unspecified: Secondary | ICD-10-CM | POA: Diagnosis not present

## 2014-06-27 DIAGNOSIS — I5032 Chronic diastolic (congestive) heart failure: Secondary | ICD-10-CM

## 2014-06-27 DIAGNOSIS — R1084 Generalized abdominal pain: Secondary | ICD-10-CM | POA: Diagnosis not present

## 2014-06-27 LAB — CBC WITH DIFFERENTIAL/PLATELET
BASOS PCT: 0.5 % (ref 0.0–3.0)
Basophils Absolute: 0.1 10*3/uL (ref 0.0–0.1)
EOS ABS: 0.4 10*3/uL (ref 0.0–0.7)
EOS PCT: 3 % (ref 0.0–5.0)
HEMATOCRIT: 36 % (ref 36.0–46.0)
Hemoglobin: 11.8 g/dL — ABNORMAL LOW (ref 12.0–15.0)
LYMPHS ABS: 2.2 10*3/uL (ref 0.7–4.0)
Lymphocytes Relative: 18 % (ref 12.0–46.0)
MCHC: 32.8 g/dL (ref 30.0–36.0)
MCV: 76 fl — AB (ref 78.0–100.0)
MONO ABS: 1.6 10*3/uL — AB (ref 0.1–1.0)
Monocytes Relative: 12.7 % — ABNORMAL HIGH (ref 3.0–12.0)
NEUTROS ABS: 8.2 10*3/uL — AB (ref 1.4–7.7)
Neutrophils Relative %: 65.8 % (ref 43.0–77.0)
Platelets: 254 10*3/uL (ref 150.0–400.0)
RBC: 4.74 Mil/uL (ref 3.87–5.11)
RDW: 19.3 % — ABNORMAL HIGH (ref 11.5–15.5)
WBC: 12.5 10*3/uL — AB (ref 4.0–10.5)

## 2014-06-27 LAB — BASIC METABOLIC PANEL
BUN: 26 mg/dL — ABNORMAL HIGH (ref 6–23)
CO2: 30 mEq/L (ref 19–32)
CREATININE: 1.13 mg/dL (ref 0.40–1.20)
Calcium: 9 mg/dL (ref 8.4–10.5)
Chloride: 97 mEq/L (ref 96–112)
GFR: 48.62 mL/min — ABNORMAL LOW (ref >60.00)
Glucose, Bld: 76 mg/dL (ref 70–99)
Potassium: 5 mEq/L (ref 3.5–5.1)
Sodium: 130 mEq/L — ABNORMAL LOW (ref 135–145)

## 2014-06-30 ENCOUNTER — Encounter (HOSPITAL_COMMUNITY): Admission: RE | Admit: 2014-06-30 | Payer: Self-pay | Source: Ambulatory Visit

## 2014-06-30 ENCOUNTER — Telehealth: Payer: Self-pay | Admitting: *Deleted

## 2014-06-30 ENCOUNTER — Ambulatory Visit: Payer: Medicare Other | Admitting: Gastroenterology

## 2014-06-30 ENCOUNTER — Telehealth (HOSPITAL_COMMUNITY): Payer: Self-pay | Admitting: *Deleted

## 2014-06-30 ENCOUNTER — Other Ambulatory Visit: Payer: Self-pay | Admitting: Physician Assistant

## 2014-06-30 DIAGNOSIS — R110 Nausea: Secondary | ICD-10-CM | POA: Diagnosis not present

## 2014-06-30 DIAGNOSIS — D72829 Elevated white blood cell count, unspecified: Secondary | ICD-10-CM | POA: Diagnosis not present

## 2014-06-30 DIAGNOSIS — N289 Disorder of kidney and ureter, unspecified: Secondary | ICD-10-CM | POA: Diagnosis not present

## 2014-06-30 NOTE — Telephone Encounter (Signed)
Dot, did she want a refill?

## 2014-06-30 NOTE — Telephone Encounter (Signed)
Brittany [name]- This patient requests refills on zofran. She was given #15 on 06/23/14. Do you want to refill?

## 2014-06-30 NOTE — Telephone Encounter (Signed)
pt notified about lab results. Pt states she has her f/u today with PCP to further evaluate elevated WBC.

## 2014-07-01 DIAGNOSIS — M859 Disorder of bone density and structure, unspecified: Secondary | ICD-10-CM | POA: Diagnosis not present

## 2014-07-01 DIAGNOSIS — M8580 Other specified disorders of bone density and structure, unspecified site: Secondary | ICD-10-CM | POA: Diagnosis not present

## 2014-07-02 ENCOUNTER — Encounter (HOSPITAL_COMMUNITY): Payer: Self-pay

## 2014-07-04 ENCOUNTER — Encounter (HOSPITAL_COMMUNITY): Payer: Self-pay

## 2014-07-04 ENCOUNTER — Ambulatory Visit (INDEPENDENT_AMBULATORY_CARE_PROVIDER_SITE_OTHER): Payer: Medicare Other | Admitting: *Deleted

## 2014-07-04 DIAGNOSIS — I4891 Unspecified atrial fibrillation: Secondary | ICD-10-CM

## 2014-07-04 DIAGNOSIS — I214 Non-ST elevation (NSTEMI) myocardial infarction: Secondary | ICD-10-CM

## 2014-07-04 DIAGNOSIS — I480 Paroxysmal atrial fibrillation: Secondary | ICD-10-CM

## 2014-07-04 LAB — POCT INR: INR: 2.5

## 2014-07-07 ENCOUNTER — Encounter (HOSPITAL_COMMUNITY): Payer: Self-pay

## 2014-07-07 DIAGNOSIS — E871 Hypo-osmolality and hyponatremia: Secondary | ICD-10-CM | POA: Diagnosis not present

## 2014-07-09 ENCOUNTER — Encounter (HOSPITAL_COMMUNITY): Payer: Self-pay

## 2014-07-11 ENCOUNTER — Encounter (HOSPITAL_COMMUNITY): Payer: Self-pay

## 2014-07-14 ENCOUNTER — Encounter (HOSPITAL_COMMUNITY): Payer: Self-pay

## 2014-07-15 ENCOUNTER — Other Ambulatory Visit: Payer: Self-pay | Admitting: Family Medicine

## 2014-07-15 ENCOUNTER — Ambulatory Visit
Admission: RE | Admit: 2014-07-15 | Discharge: 2014-07-15 | Disposition: A | Discharge status: Home / Self Care | Payer: Medicare Other | Source: Ambulatory Visit | Attending: Family Medicine | Admitting: Family Medicine

## 2014-07-15 DIAGNOSIS — H524 Presbyopia: Secondary | ICD-10-CM | POA: Diagnosis not present

## 2014-07-15 DIAGNOSIS — J984 Other disorders of lung: Secondary | ICD-10-CM | POA: Diagnosis not present

## 2014-07-15 DIAGNOSIS — H3531 Nonexudative age-related macular degeneration: Secondary | ICD-10-CM | POA: Diagnosis not present

## 2014-07-15 DIAGNOSIS — E871 Hypo-osmolality and hyponatremia: Secondary | ICD-10-CM

## 2014-07-15 DIAGNOSIS — H53461 Homonymous bilateral field defects, right side: Secondary | ICD-10-CM | POA: Diagnosis not present

## 2014-07-15 DIAGNOSIS — H40013 Open angle with borderline findings, low risk, bilateral: Secondary | ICD-10-CM | POA: Diagnosis not present

## 2014-07-16 ENCOUNTER — Encounter (HOSPITAL_COMMUNITY): Payer: Self-pay

## 2014-07-16 DIAGNOSIS — M7551 Bursitis of right shoulder: Secondary | ICD-10-CM | POA: Diagnosis not present

## 2014-07-18 ENCOUNTER — Encounter (HOSPITAL_COMMUNITY): Payer: Self-pay

## 2014-07-20 DIAGNOSIS — R312 Other microscopic hematuria: Secondary | ICD-10-CM | POA: Diagnosis not present

## 2014-07-20 DIAGNOSIS — N3000 Acute cystitis without hematuria: Secondary | ICD-10-CM | POA: Diagnosis not present

## 2014-07-21 ENCOUNTER — Encounter (HOSPITAL_COMMUNITY): Payer: Self-pay

## 2014-07-22 ENCOUNTER — Ambulatory Visit (HOSPITAL_COMMUNITY)
Admission: RE | Admit: 2014-07-22 | Discharge: 2014-07-22 | Disposition: A | Discharge status: Home / Self Care | Payer: Medicare Other | Source: Ambulatory Visit | Attending: Physician Assistant | Admitting: Physician Assistant

## 2014-07-22 DIAGNOSIS — R109 Unspecified abdominal pain: Secondary | ICD-10-CM | POA: Insufficient documentation

## 2014-07-22 DIAGNOSIS — D72829 Elevated white blood cell count, unspecified: Secondary | ICD-10-CM

## 2014-07-22 DIAGNOSIS — R112 Nausea with vomiting, unspecified: Secondary | ICD-10-CM | POA: Insufficient documentation

## 2014-07-22 DIAGNOSIS — R1084 Generalized abdominal pain: Secondary | ICD-10-CM

## 2014-07-22 MED ORDER — TECHNETIUM TC 99M SULFUR COLLOID
2.2000 | Freq: Once | INTRAVENOUS | Status: AC | PRN
  Administered 2014-07-22: 2.2 via INTRAVENOUS

## 2014-07-23 ENCOUNTER — Encounter (HOSPITAL_COMMUNITY): Payer: Self-pay

## 2014-07-23 DIAGNOSIS — E871 Hypo-osmolality and hyponatremia: Secondary | ICD-10-CM | POA: Diagnosis not present

## 2014-07-23 DIAGNOSIS — N3090 Cystitis, unspecified without hematuria: Secondary | ICD-10-CM | POA: Diagnosis not present

## 2014-07-25 ENCOUNTER — Encounter (HOSPITAL_COMMUNITY): Payer: Self-pay

## 2014-07-28 ENCOUNTER — Encounter (HOSPITAL_COMMUNITY): Payer: Self-pay

## 2014-07-30 ENCOUNTER — Encounter (HOSPITAL_COMMUNITY): Payer: Self-pay

## 2014-07-30 ENCOUNTER — Ambulatory Visit (INDEPENDENT_AMBULATORY_CARE_PROVIDER_SITE_OTHER): Payer: Medicare Other | Admitting: Physician Assistant

## 2014-07-30 ENCOUNTER — Encounter: Payer: Self-pay | Admitting: Physician Assistant

## 2014-07-30 VITALS — BP 112/60 | HR 64 | Ht 61.0 in | Wt 148.4 lb

## 2014-07-30 DIAGNOSIS — R1013 Epigastric pain: Secondary | ICD-10-CM

## 2014-07-30 DIAGNOSIS — I214 Non-ST elevation (NSTEMI) myocardial infarction: Secondary | ICD-10-CM | POA: Diagnosis not present

## 2014-07-30 MED ORDER — SUCRALFATE 1 GM/10ML PO SUSP: 1.0000 g | Freq: Three times a day (TID) | ORAL | Status: DC

## 2014-07-30 NOTE — Progress Notes (Signed)
Patient ID: Brittany [name], female   DOB: [birth year], 81 y.o.   MRN: [id number]     History of Present Illness: Brittany [name] is an 81 year old female known to Dr. Deatra Ina. She has a history of hypertension, chronic diastolic heart failure, atrial fibrillation, aortic stenosis, status post NSTEMI, GERD, gastritis, ampullary stenosis, and is status post aortic valve replacement. She had an ERCP with sphincterotomy and ERCP with balloon dilation in May 2014. She was last seen here in mid March 2016 with a 3-4 month history of epigastric pain and nausea. She was scheduled for CT of the abdomen which was obtained on 06/24/2014 and had no acute findings. She was noted to have atherosclerosis with SMA and celiac stenosis but no progression from 2013 to explain acute symptoms. She also had a lobulated liver suspicious for cirrhosis. She had a gastric emptying scan in April 12 which was normal. He has been having increasing episodes of heartburn. She continues to complain of epigastric pain that is worse postprandially. Last EGD was in December 2013 and showed nonerosive gastritis and esophagitis. She is currently on Plavix and warfarin. She was recently seen by cardiology in March 2016. She is on Coumadin for her A. fib patient states she was advised to continue her Plavix for at least 6 more months as well.   Past Medical History  Diagnosis Date  . Dyslipidemia     takes Lipitor daily  . Arthritis   . Urine incontinence   . Diverticulosis   . IC (interstitial cystitis)   . S/P aortic valve replacement     2005  . H/O hiatal hernia   . History of TIA (transient ischemic attack)     2013-- RESIDUAL PERIPHERAL VISION RIGHT EYE--  RESOLVED  . History of bacterial endocarditis   . Foot drop     SINCE 1987  . History of gastritis   . Chronic back pain   . Hypertension   . Atrial fibrillation   . GERD (gastroesophageal reflux disease)     Past Surgical History  Procedure Laterality Date  .  Abdominal hysterectomy  1967  . Aortic valve replacement  09-29-2003     [facility] pericardial tissue valve  . Cholecystectomy  1993  . Vein ligation  1969    RIGHT LOWER LEG  . Dilation and curettage of uterus    . Ercp N/A 08/10/2012    Procedure: ENDOSCOPIC RETROGRADE CHOLANGIOPANCREATOGRAPHY (ERCP);  Surgeon: Inda Castle, MD;  Location: [facility];  Service: Gastroenterology;  Laterality: N/A;  . Cataract extraction w/ intraocular lens  implant, bilateral    . Appendectomy  1933  . Cardiac catheterization  07-15-2003 DR HELEN PRESTON    MODERATE TO MODERATELY SEVERE CALCIFIC AORTIC STENOSIS/  NORMAL CORONARY ARTERIES  . Right shoulder arthroscopy w/ debridement rotator cuff and labral tear/ acrominoplasty/ distal clavicle excision/ ca ligament release  10-08-1999  . Cysto/ hydrodistention/ instillation clorpactin  MULTIPLE  last one 2009  . Mini-open right rotator cuff repair  07-12-2001  . Transvaginal tape procedure  07-30-2002  . Lumbar disc surgery  1985 &  2007  . Macroplastique urethral implantation  08-27-2009  . Transthoracic echocardiogram  08-10-2011    MILD LVH/  EF 55-60%/  NORMAL AVR TISSUE/ MILD MR/  MODERATE DILATED LA/  MILD DILATED RA  . Cardiovascular stress test  01-30-2012  DR NASHER    NORMAL NUCLEAR STUDY/  EF 73%/  NORMAL LVF  . Tonsillectomy and adenoidectomy    .  Carpal tunnel release Bilateral   . Cysto with hydrodistension N/A 02/05/2013    Procedure: CYSTOSCOPY/HYDRODISTENSION;  Surgeon: Irine Seal, MD;  Location: [facility];  Service: Urology;  Laterality: N/A;  . Left heart catheterization with coronary angiogram N/A 10/18/2013    Procedure: LEFT HEART CATHETERIZATION WITH CORONARY ANGIOGRAM;  Surgeon: Jettie Booze, MD;  Location[facility] C[facility] LAB;  Service: Cardiovascular;  Laterality: N/A;   Family History  Problem Relation Age of Onset  . Heart disease Father   . Coronary artery disease Brother   . Bipolar disorder  Brother     commited suiside at 80yo  . Alcohol abuse Sister     sister #1  . Alcohol abuse Sister     sister #2  . Colon cancer Maternal Aunt   . Stomach cancer Maternal Grandmother    History  Substance Use Topics  . Smoking status: Former Smoker    Types: Cigarettes    Quit date: 03/06/1990  . Smokeless tobacco: Never Used  . Alcohol Use: No   Current Outpatient Prescriptions  Medication Sig Dispense Refill  . atenolol (TENORMIN) 25 MG tablet TAKE 1 TABLET (25 MG TOTAL) BY MOUTH DAILY. 30 tablet 4  . atorvastatin (LIPITOR) 20 MG tablet Take 20 mg by mouth daily.     Marland Kitchen CARTIA XT 120 MG 24 hr capsule TAKE 1 TABLET (120 MG TOTAL) BY MOUTH DAILY. 30 capsule 4  . cholecalciferol (VITAMIN D) 1000 UNITS tablet Take 1,000 Units by mouth daily.    . clopidogrel (PLAVIX) 75 MG tablet Take 1 tablet (75 mg total) by mouth daily. 30 tablet 11  . diazepam (VALIUM) 5 MG tablet every 12 (twelve) hours as needed for muscle spasms.     . furosemide (LASIX) 40 MG tablet Take 1 tablet (40 mg total) by mouth as directed. TAKE 40 MG ON TUES, THUR, SAT, AND SUN (Patient taking differently: Take 40 mg by mouth as needed (Per pt, taking as needed.). TAKE 40 MG ON TUES, THUR, SAT, AND SUN) 30 tablet 11  . HYDROcodone-acetaminophen (NORCO/VICODIN) 5-325 MG per tablet 1 tablet every 6 (six) hours as needed for moderate pain.     . hyoscyamine (ANASPAZ) 0.125 MG TBDP disintergrating tablet Place 0.125 mg under the tongue as needed for bladder spasms.    Marland Kitchen lisinopril (PRINIVIL,ZESTRIL) 20 MG tablet Take 1 tablet (20 mg total) by mouth daily. Pt takes 20mg  two tabs daily 30 tablet 11  . Omega-3 Fatty Acids (FISH OIL PO) Take 1 g by mouth daily.     Marland Kitchen omeprazole (PRILOSEC) 40 MG capsule Take 40 mg by mouth daily.    . ondansetron (ZOFRAN) 8 MG tablet TAKE 1 TABLET (8 MG TOTAL) BY MOUTH EVERY 8 (EIGHT) HOURS AS NEEDED FOR NAUSEA OR VOMITING. 15 tablet 0  . oxybutynin (DITROPAN) 5 MG tablet Take 5 mg by mouth 3  (three) times daily.    . Potassium Chloride ER 20 MEQ TBCR Take 10 mEq by mouth daily. (Patient taking differently: Take 20 mEq by mouth as needed (Per pt taking as needed with Furosemide.). ) 30 tablet 11  . prochlorperazine (COMPAZINE) 5 MG tablet Take 1 tablet by mouth 2 (two) times daily.    . promethazine (PHENERGAN) 25 MG tablet TAKE 1 TABLET (25 MG TOTAL) BY MOUTH EVERY 6 (SIX) HOURS AS NEEDED FOR NAUSEA OR VOMITING. 30 tablet 3  . ranitidine (ZANTAC) 150 MG tablet Take 150 mg by mouth at bedtime.    Marland Kitchen warfarin (  COUMADIN) 5 MG tablet Take 1.5 tablets (7.5 mg total) by mouth daily at 6 PM. (Patient taking differently: Take 7.5 mg by mouth daily at 6 PM. Patient states that she takes 1 tablet by mouth Monday thur Saturday and 1/2 tablet on Sunday.) 30 tablet 5  . zolpidem (AMBIEN) 10 MG tablet Take 10 mg by mouth at bedtime as needed for sleep.    . sucralfate (CARAFATE) 1 GM/10ML suspension Take 10 mLs (1 g total) by mouth 4 (four) times daily -  with meals and at bedtime. 1200 mL 0   No current facility-administered medications for this visit.   Allergies  Allergen Reactions  . Amoxicillin Diarrhea and Nausea And Vomiting  . Cephalexin Diarrhea  . Codeine Nausea Only  . Irbesartan Swelling    Facial and hand numbness and swelling. Patient believes reaction was with this medication but isn't 100% sure. She is currently tolerating lisinopril.  Marland Kitchen Morphine And Related Other (See Comments)    HX ADDICTION / WITHDRAWAL  . Neurontin [Gabapentin] Swelling    Legs swell  . Nitrofurantoin Monohyd Macro Diarrhea and Nausea Only  . Prednisone Nausea Only  . Sulfa Antibiotics Rash      Review of Systems: Per history of present illness otherwise negative     Studies:   Dg Chest 2 View  07/15/2014   CLINICAL DATA:  Chest pressure.  Hypertension.  EXAM: CHEST  2 VIEW  COMPARISON:  None.  FINDINGS: Mediastinum hilar structures are normal. Prior cardiac valve replacement. Cardiomegaly with  normal pulmonary vascularity. Left base pleural parenchymal thickening noted consistent with scarring. No acute bony abnormality identified.  IMPRESSION: 1. No acute cardiopulmonary disease. Mild left base pleural parenchymal thickening consistent with scarring. 2. Prior cardiac valve replacement.  Cardiomegaly.  No CHF.   Electronically Signed   By: Marcello Moores  Register   On: 07/15/2014 10:28   Nm Gastric Emptying  07/22/2014   CLINICAL DATA:  Nausea, vomiting and abdominal pain for 1 year, worsening.  EXAM: NUCLEAR MEDICINE GASTRIC EMPTYING SCAN  TECHNIQUE: After oral ingestion of radiolabeled meal, sequential abdominal images were obtained for 120 minutes. Residual percentage of activity remaining within the stomach was calculated at 60 and 120 minutes.  RADIOPHARMACEUTICALS:  2.2 Technetium 99-m labeled sulfur colloid  COMPARISON:  None.  FINDINGS: Expected location of the stomach in the left upper quadrant. Ingested meal empties the stomach gradually over the course of the study with 48% retention at 60 min and 28.6% retention at 120 min (normal retention less than 30% at a 120 min).  IMPRESSION: Normal gastric emptying study.   Electronically Signed   By: Inge Rise M.D.   On: 07/22/2014 11:56   Images     Show images for CT Abdomen Pelvis W Contrast     Study Result     CLINICAL DATA: Upper abdominal pain, nausea, anorexia.  EXAM: CT ABDOMEN AND PELVIS WITH CONTRAST  TECHNIQUE: Multidetector CT imaging of the abdomen and pelvis was performed using the standard protocol following bolus administration of intravenous contrast.  CONTRAST: 142mL OMNIPAQUE IOHEXOL 300 MG/ML SOLN  COMPARISON: 02/03/2012  FINDINGS: BODY WALL: No contributory findings.  LOWER CHEST: Partially visualized biatrial enlargement. The patient is status post aortic valve replacement. There is bulky mitral annular calcification. Coronary atherosclerosis.  ABDOMEN/PELVIS:  Liver: Subtle  nodularity of the liver surface which is new or progressed from 2013. No focal lesion.  Biliary: Cholecystectomy.  Pancreas: Unremarkable.  Spleen: Unremarkable.  Adrenals: Unremarkable.  Kidneys and ureters:  2 small nonobstructive stones in the upper pole right kidney, up to 3 mm. No hydronephrosis or ureteral calculus.  Bladder: As noted previously, there is a cystic structure interposed between the urethra and vagina with enhancing rim. The cystic component measures 2 cm. On sagittal imaging, there may be a circumferential extension around the urethra, favoring bladder diverticulum. There is new calcification in the right bladder base appears mural and is likely from patient's history of urethral augmentation. This presumed history of incontinence also favors diverticulum.  Reproductive: Hysterectomy and oophorectomies.  Bowel: No obstruction. No pericecal inflammation. Mild distal colonic diverticulosis.  Retroperitoneum: No mass or adenopathy.  Peritoneum: No ascites or pneumoperitoneum.  Vascular: Advanced atherosclerosis of the aorta and branch vessels. There is prominent stenosis of the right renal artery without delayed enhancement. SMA and celiac axis proximal atherosclerotic narrowing without progression from 2013. Ectatic distal abdominal aorta at 23 mm, stable from 2013.  OSSEOUS: Advanced and diffuse lumbar degenerative disc disease, status post lumbosacral fusion.  IMPRESSION: 1. No acute findings. 2. Atherosclerosis with SMA and celiac stenosis but no progression from 2013 to explain acute symptoms. 3. Probable 2 cm urethral diverticulum. 4. Lobulated liver suspicious for cirrhosis. 5. Nonobstructive right nephrolithiasis.   Electronically Signed  By: Monte Fantasia M.D.  On: 06/24/2014 11:08     Physical Exam: General: Pleasant, well developedfemale in no acute distress Head: Normocephalic and atraumatic Eyes:  sclerae  anicteric, conjunctiva pink  Ears: Normal auditory acuity Lungs: Clear throughout to auscultation Heart: Irregularly irregular Abdomen: Soft, non distended, non-tender. No masses, no hepatomegaly. Normal bowel sounds Musculoskeletal: Symmetrical with no gross deformities  Extremities: No edema  Neurological: Alert oriented x 4, grossly nonfocal Psychological:  Alert and cooperative. Normal mood and affect  Assessment and Recommendations:  81 year old female with a history of hypertension, dyslipidemia, aortic valve replacement, CVA, and A. fib as well as gastritis and esophagitis now with 6 months of epigastric pain associated with nausea. CT with some stenosis of SMA and celiac but no progression since 2013. She notes some postprandial pain may be ischemic or due to a recurrent gastritis. Will give patient a trial of Carafate 1 g before meals and at bedtime. Will review with Dr. Deatra Ina as to whether proceeding with diagnostic only EGD on Plavix and Coumadin may be of benefit. Will contact patient with further recommendations.       Gavriela Cashin, Vita Barley PA-C 07/30/2014,

## 2014-07-30 NOTE — Patient Instructions (Signed)
We sent a prescription to [facility]. 1. Carafate suspension- take as directed. Take 10 cc before meals and at bedtime.  Cecille Rubin will be speaking to Dr. Deatra Ina and will call you back regarding an Endoscopy.

## 2014-07-31 NOTE — Progress Notes (Signed)
Reviewed and agree with management.  Agree with diagnostic endoscopy.  She has an iron deficiency anemia.  May consider colonoscopy at the same time.  We'll have to check with cardiology regarding anticoagulants and antiplatelet medications. Brittany [name]. Deatra Ina, M.D., [facility]

## 2014-08-01 ENCOUNTER — Other Ambulatory Visit: Payer: Self-pay

## 2014-08-01 ENCOUNTER — Telehealth: Payer: Self-pay | Admitting: Physician Assistant

## 2014-08-01 ENCOUNTER — Ambulatory Visit (INDEPENDENT_AMBULATORY_CARE_PROVIDER_SITE_OTHER): Payer: Medicare Other | Admitting: *Deleted

## 2014-08-01 ENCOUNTER — Encounter (HOSPITAL_COMMUNITY): Payer: Self-pay

## 2014-08-01 DIAGNOSIS — I4891 Unspecified atrial fibrillation: Secondary | ICD-10-CM

## 2014-08-01 DIAGNOSIS — I214 Non-ST elevation (NSTEMI) myocardial infarction: Secondary | ICD-10-CM

## 2014-08-01 DIAGNOSIS — I480 Paroxysmal atrial fibrillation: Secondary | ICD-10-CM

## 2014-08-01 DIAGNOSIS — R1013 Epigastric pain: Secondary | ICD-10-CM

## 2014-08-01 LAB — POCT INR: INR: 3.4

## 2014-08-01 NOTE — Telephone Encounter (Signed)
Her PCP is Dr Serita Grammes. Dr Acie Fredrickson monitors her INR and refills her Coumadin. Patient says she does not have any anemia. . She is declining colonoscopy. Dr Kelby Fam first opening is 08/18/14.

## 2014-08-01 NOTE — Telephone Encounter (Signed)
Spoke with patient and she states she had a horrible night with nausea and heartburn. Patient had not gotten her rx for Carafate. Instructed patient that she needs to get her prescription and start it. Patient states Lori Hvozdovic, PA-C was to speak with Dr. Deatra Ina about an EGD with her still on blood thinners. She would like to have the procedure if possible.

## 2014-08-04 ENCOUNTER — Other Ambulatory Visit: Payer: Self-pay

## 2014-08-04 ENCOUNTER — Encounter (HOSPITAL_COMMUNITY): Payer: Self-pay

## 2014-08-04 DIAGNOSIS — D649 Anemia, unspecified: Secondary | ICD-10-CM

## 2014-08-04 NOTE — Telephone Encounter (Signed)
Patient has been notified of EGD on 08/18/14. She will come for a pre-visit 08/07/14. A letter has been sent to Dr Acie Fredrickson requesting guidance on her Coumadin and possible bridging.

## 2014-08-04 NOTE — Telephone Encounter (Signed)
Have pt have pt go fo a cbc, iron studies and ferritin today or tomorrow so we have the results before she comes in on the 28th. Thank you.

## 2014-08-04 NOTE — Telephone Encounter (Signed)
Patient states she will not come in until the appointment on Thursday. She does agree to go to the lab after her pre-op visit.

## 2014-08-05 ENCOUNTER — Telehealth: Payer: Self-pay | Admitting: *Deleted

## 2014-08-05 NOTE — Telephone Encounter (Signed)
I called and spoke to the pharmacist Mia Creek at [facility].  He said her son picked up part of the prescription tablets of Sulcrafate, and said they would pick up the remainder at a later date.  I called the patient first to ask if she got the tablets. I LM for the patient to call me if she needs our help.

## 2014-08-06 ENCOUNTER — Other Ambulatory Visit: Payer: Self-pay

## 2014-08-06 ENCOUNTER — Encounter (HOSPITAL_COMMUNITY): Payer: Self-pay

## 2014-08-06 ENCOUNTER — Telehealth: Payer: Self-pay

## 2014-08-06 NOTE — Telephone Encounter (Signed)
  08/06/2014   RE: Brittany [name] DOB: [birth year] MRN: [id number]   Dear Dr Johnsie Cancel,    We have scheduled the above patient for an endoscopic procedure. Our records show that she is on anticoagulation therapy.   Please advise as to how long the patient may come off her therapy of Coumadin prior to the procedure, which is scheduled for 08/18/14. She is coming in for her instruction on prep 08/07/14.  Please route the instructions to Virgina Evener or fax written instructions to [phone]  Sincerely,  Virgina Evener, LPN for Dr [name], MD

## 2014-08-07 ENCOUNTER — Telehealth: Payer: Self-pay

## 2014-08-07 ENCOUNTER — Ambulatory Visit (AMBULATORY_SURGERY_CENTER): Payer: Self-pay | Admitting: *Deleted

## 2014-08-07 ENCOUNTER — Other Ambulatory Visit (INDEPENDENT_AMBULATORY_CARE_PROVIDER_SITE_OTHER): Payer: Medicare Other

## 2014-08-07 ENCOUNTER — Ambulatory Visit (INDEPENDENT_AMBULATORY_CARE_PROVIDER_SITE_OTHER): Payer: Medicare Other | Admitting: *Deleted

## 2014-08-07 VITALS — Ht 61.0 in | Wt 150.0 lb

## 2014-08-07 DIAGNOSIS — R1013 Epigastric pain: Secondary | ICD-10-CM

## 2014-08-07 DIAGNOSIS — D649 Anemia, unspecified: Secondary | ICD-10-CM | POA: Diagnosis not present

## 2014-08-07 DIAGNOSIS — I4891 Unspecified atrial fibrillation: Secondary | ICD-10-CM | POA: Diagnosis not present

## 2014-08-07 DIAGNOSIS — I480 Paroxysmal atrial fibrillation: Secondary | ICD-10-CM

## 2014-08-07 DIAGNOSIS — I214 Non-ST elevation (NSTEMI) myocardial infarction: Secondary | ICD-10-CM | POA: Diagnosis not present

## 2014-08-07 LAB — CBC WITH DIFFERENTIAL/PLATELET
BASOS PCT: 1.2 % (ref 0.0–3.0)
Basophils Absolute: 0.1 10*3/uL (ref 0.0–0.1)
EOS PCT: 6.1 % — AB (ref 0.0–5.0)
Eosinophils Absolute: 0.5 10*3/uL (ref 0.0–0.7)
HEMATOCRIT: 33 % — AB (ref 36.0–46.0)
Hemoglobin: 10.7 g/dL — ABNORMAL LOW (ref 12.0–15.0)
Lymphocytes Relative: 19.8 % (ref 12.0–46.0)
Lymphs Abs: 1.7 10*3/uL (ref 0.7–4.0)
MCHC: 32.5 g/dL (ref 30.0–36.0)
MCV: 77.8 fl — ABNORMAL LOW (ref 78.0–100.0)
Monocytes Absolute: 1 10*3/uL (ref 0.1–1.0)
Monocytes Relative: 11.8 % (ref 3.0–12.0)
NEUTROS PCT: 61.1 % (ref 43.0–77.0)
Neutro Abs: 5.2 10*3/uL (ref 1.4–7.7)
PLATELETS: 247 10*3/uL (ref 150.0–400.0)
RBC: 4.24 Mil/uL (ref 3.87–5.11)
RDW: 18.8 % — AB (ref 11.5–15.5)
WBC: 8.5 10*3/uL (ref 4.0–10.5)

## 2014-08-07 LAB — POCT INR: INR: 1.6

## 2014-08-07 LAB — IBC PANEL
IRON: 24 ug/dL — AB (ref 42–145)
Saturation Ratios: 5.1 % — ABNORMAL LOW (ref 20.0–50.0)
TRANSFERRIN: 334 mg/dL (ref 212.0–360.0)

## 2014-08-07 LAB — FERRITIN: Ferritin: 14.2 ng/mL (ref 10.0–291.0)

## 2014-08-07 NOTE — Telephone Encounter (Signed)
-----   Message from [employer], PA-C sent at 08/07/2014  1:42 PM EDT ----- Beth--sched on meds, egd--please put in comments pt to be done on warfarin and plavix, diagnostic only ----- Message -----    From: Inda Castle, MD    Sent: 08/07/2014   1:04 PM      To: Vita Barley Hvozdovic, PA-C  OK.  Schedule EGD only while continuing meds.  ----- Message -----    From: Vita Barley Hvozdovic, PA-C    Sent: 08/07/2014   9:51 AM      To: Inda Castle, MD  Dr Deatra Ina, Pt is adamant that she will not have another colonoscopy. Are you ok with foing egd on warfarin and plavix as a diagnostic only procedure? Pt cannot come off her warfarin/plavix til fall.  ----- Message -----    From: Greggory Keen, LPN    Sent: 9/48/0165   8:38 AM      To: Vita Barley Hvozdovic, PA-C  Yes. I am here. I know you have clinic. I will come to you whenever you have a moment. The patient is supposed to come for her labs and nurse visit today. I have not heard back from cardiology yet. ----- Message -----    From: Vita Barley Hvozdovic, PA-C    Sent: 08/06/2014   9:39 PM      To: Greggory Keen, LPN  Beth can you speak to me about this pt? ----- Message -----    From: Inda Castle, MD    Sent: 07/31/2014   9:07 AM      To: Vita Barley Hvozdovic, PA-C  It is okay to do upper endoscopy on Coumadin and Plavix.  However, note that she has a iron deficiency anemia.  Last colonoscopy was 10 years ago.  You may consider doing a double procedure while while bridging with Lovenox.  We'll need to check with cardiology first ----- Message -----    From: Vita Barley Hvozdovic, PA-C    Sent: 07/30/2014   9:25 PM      To: Inda Castle, MD  Dr Deatra Ina, Please see office note of 4/20. Would you consider diagnostic only egd coumadin and plavix? Cecille Rubin

## 2014-08-07 NOTE — Telephone Encounter (Signed)
Corrected letter to send to Dr Acie Fredrickson:   08/07/2014   RE: Brittany [name] DOB: 24-Dec-1927 MRN: [id number]   Dear Dr Acie Fredrickson,    We have scheduled the above patient for an endoscopic procedure. Our records show that she is on anticoagulation therapy.   Please advise as to how long the patient may come off her therapy of Coumadin and Plavix prior to the procedure, which is scheduled for 08/18/2014.  Please fax back/ or route response to Dr Deatra Ina. Fax # [phone].   Sincerely,    Virgina Evener, LPN

## 2014-08-07 NOTE — Telephone Encounter (Signed)
Gay Filler,  Ms. Hinnenkamp may hold her Plavix and coumadin  For 7 days. I think she should be bridged with Lovenox because of her CVA. Will you assist with this   Thanks  Abbe Amsterdam

## 2014-08-07 NOTE — Telephone Encounter (Signed)
Will contact Dr Elmarie Shiley office. They have already arranged to teach the patient injections for bridging with Lovenox. I will notify them to cancel that and  I will contact the patient. Endo at [facility] notified the EGD will be diagnostic on anti-coagulation.

## 2014-08-07 NOTE — Telephone Encounter (Signed)
Spoke with pt.  She will come to the clinic today for Lovenox dosing.

## 2014-08-08 ENCOUNTER — Other Ambulatory Visit: Payer: Self-pay | Admitting: *Deleted

## 2014-08-08 ENCOUNTER — Encounter (HOSPITAL_COMMUNITY): Payer: Self-pay | Admitting: *Deleted

## 2014-08-08 ENCOUNTER — Encounter (HOSPITAL_COMMUNITY): Payer: Self-pay

## 2014-08-08 DIAGNOSIS — D649 Anemia, unspecified: Secondary | ICD-10-CM

## 2014-08-11 ENCOUNTER — Encounter (HOSPITAL_COMMUNITY): Payer: Self-pay

## 2014-08-13 ENCOUNTER — Encounter (HOSPITAL_COMMUNITY): Payer: Self-pay

## 2014-08-15 ENCOUNTER — Encounter (HOSPITAL_COMMUNITY): Payer: Self-pay

## 2014-08-15 ENCOUNTER — Other Ambulatory Visit (INDEPENDENT_AMBULATORY_CARE_PROVIDER_SITE_OTHER): Payer: Medicare Other

## 2014-08-15 ENCOUNTER — Other Ambulatory Visit: Payer: Self-pay | Admitting: Cardiovascular Disease

## 2014-08-15 DIAGNOSIS — D649 Anemia, unspecified: Secondary | ICD-10-CM | POA: Diagnosis not present

## 2014-08-18 ENCOUNTER — Ambulatory Visit (HOSPITAL_COMMUNITY)
Admission: RE | Admit: 2014-08-18 | Discharge: 2014-08-18 | Disposition: A | Discharge status: Home / Self Care | Payer: Medicare Other | Source: Ambulatory Visit | Attending: Gastroenterology | Admitting: Gastroenterology

## 2014-08-18 ENCOUNTER — Ambulatory Visit (HOSPITAL_COMMUNITY): Payer: Medicare Other | Admitting: Certified Registered Nurse Anesthetist

## 2014-08-18 ENCOUNTER — Encounter (HOSPITAL_COMMUNITY): Payer: Self-pay

## 2014-08-18 ENCOUNTER — Encounter (HOSPITAL_COMMUNITY)
Admission: RE | Disposition: A | Discharge status: Home / Self Care | Payer: Self-pay | Source: Ambulatory Visit | Attending: Gastroenterology

## 2014-08-18 ENCOUNTER — Encounter (HOSPITAL_COMMUNITY): Payer: Self-pay | Admitting: *Deleted

## 2014-08-18 DIAGNOSIS — Z880 Allergy status to penicillin: Secondary | ICD-10-CM | POA: Diagnosis not present

## 2014-08-18 DIAGNOSIS — Z87891 Personal history of nicotine dependence: Secondary | ICD-10-CM | POA: Insufficient documentation

## 2014-08-18 DIAGNOSIS — Z952 Presence of prosthetic heart valve: Secondary | ICD-10-CM | POA: Diagnosis not present

## 2014-08-18 DIAGNOSIS — Z7901 Long term (current) use of anticoagulants: Secondary | ICD-10-CM | POA: Insufficient documentation

## 2014-08-18 DIAGNOSIS — Z886 Allergy status to analgesic agent status: Secondary | ICD-10-CM | POA: Diagnosis not present

## 2014-08-18 DIAGNOSIS — K319 Disease of stomach and duodenum, unspecified: Secondary | ICD-10-CM | POA: Insufficient documentation

## 2014-08-18 DIAGNOSIS — D649 Anemia, unspecified: Secondary | ICD-10-CM | POA: Insufficient documentation

## 2014-08-18 DIAGNOSIS — K449 Diaphragmatic hernia without obstruction or gangrene: Secondary | ICD-10-CM | POA: Insufficient documentation

## 2014-08-18 DIAGNOSIS — I5033 Acute on chronic diastolic (congestive) heart failure: Secondary | ICD-10-CM | POA: Insufficient documentation

## 2014-08-18 DIAGNOSIS — I4891 Unspecified atrial fibrillation: Secondary | ICD-10-CM | POA: Insufficient documentation

## 2014-08-18 DIAGNOSIS — Z881 Allergy status to other antibiotic agents status: Secondary | ICD-10-CM | POA: Diagnosis not present

## 2014-08-18 DIAGNOSIS — I10 Essential (primary) hypertension: Secondary | ICD-10-CM | POA: Insufficient documentation

## 2014-08-18 DIAGNOSIS — K2950 Unspecified chronic gastritis without bleeding: Secondary | ICD-10-CM | POA: Insufficient documentation

## 2014-08-18 DIAGNOSIS — M1990 Unspecified osteoarthritis, unspecified site: Secondary | ICD-10-CM | POA: Diagnosis not present

## 2014-08-18 DIAGNOSIS — I252 Old myocardial infarction: Secondary | ICD-10-CM | POA: Diagnosis not present

## 2014-08-18 DIAGNOSIS — Z9861 Coronary angioplasty status: Secondary | ICD-10-CM | POA: Diagnosis not present

## 2014-08-18 DIAGNOSIS — Z9049 Acquired absence of other specified parts of digestive tract: Secondary | ICD-10-CM | POA: Insufficient documentation

## 2014-08-18 DIAGNOSIS — R1013 Epigastric pain: Secondary | ICD-10-CM | POA: Diagnosis not present

## 2014-08-18 DIAGNOSIS — K219 Gastro-esophageal reflux disease without esophagitis: Secondary | ICD-10-CM | POA: Insufficient documentation

## 2014-08-18 DIAGNOSIS — Z90710 Acquired absence of both cervix and uterus: Secondary | ICD-10-CM | POA: Insufficient documentation

## 2014-08-18 DIAGNOSIS — Z888 Allergy status to other drugs, medicaments and biological substances status: Secondary | ICD-10-CM | POA: Insufficient documentation

## 2014-08-18 DIAGNOSIS — E785 Hyperlipidemia, unspecified: Secondary | ICD-10-CM | POA: Diagnosis not present

## 2014-08-18 DIAGNOSIS — K2970 Gastritis, unspecified, without bleeding: Secondary | ICD-10-CM | POA: Diagnosis not present

## 2014-08-18 HISTORY — DX: Reserved for inherently not codable concepts without codable children: IMO0001

## 2014-08-18 HISTORY — DX: Unspecified hearing loss, unspecified ear: H91.90

## 2014-08-18 HISTORY — DX: Personal history of peptic ulcer disease: Z87.11

## 2014-08-18 HISTORY — DX: Cardiac arrhythmia, unspecified: I49.9

## 2014-08-18 HISTORY — DX: Personal history of other diseases of the digestive system: Z87.19

## 2014-08-18 HISTORY — PX: ESOPHAGOGASTRODUODENOSCOPY (EGD) WITH PROPOFOL: SHX5813

## 2014-08-18 LAB — HEMOCCULT SLIDES (X 3 CARDS)
FECAL OCCULT BLD: NEGATIVE
OCCULT 1: NEGATIVE
OCCULT 2: NEGATIVE
OCCULT 3: NEGATIVE
OCCULT 4: NEGATIVE
OCCULT 5: NEGATIVE

## 2014-08-18 SURGERY — ESOPHAGOGASTRODUODENOSCOPY (EGD) WITH PROPOFOL
Anesthesia: Monitor Anesthesia Care

## 2014-08-18 MED ORDER — PROPOFOL 10 MG/ML IV BOLUS
INTRAVENOUS | Status: AC
  Filled 2014-08-18: qty 20

## 2014-08-18 MED ORDER — LIDOCAINE HCL (CARDIAC) 20 MG/ML IV SOLN
INTRAVENOUS | Status: AC
  Filled 2014-08-18: qty 5

## 2014-08-18 MED ORDER — PROPOFOL INFUSION 10 MG/ML OPTIME
INTRAVENOUS | Status: DC | PRN
  Administered 2014-08-18: 160 ug/kg/min via INTRAVENOUS

## 2014-08-18 MED ORDER — LACTATED RINGERS IV SOLN
INTRAVENOUS | Status: DC
  Administered 2014-08-18: 1000 mL via INTRAVENOUS

## 2014-08-18 MED ORDER — SODIUM CHLORIDE 0.9 % IV SOLN: INTRAVENOUS | Status: DC

## 2014-08-18 MED ORDER — PROPOFOL 10 MG/ML IV BOLUS
INTRAVENOUS | Status: DC | PRN
  Administered 2014-08-18: 30 mg via INTRAVENOUS

## 2014-08-18 MED ORDER — LIDOCAINE HCL (CARDIAC) 20 MG/ML IV SOLN
INTRAVENOUS | Status: DC | PRN
  Administered 2014-08-18: 50 mg via INTRAVENOUS

## 2014-08-18 SURGICAL SUPPLY — 14 items

## 2014-08-18 NOTE — Transfer of Care (Signed)
Immediate Anesthesia Transfer of Care Note  Patient: Brittany [name]  Procedure(s) Performed: Procedure(s): ESOPHAGOGASTRODUODENOSCOPY (EGD) WITH PROPOFOL (N/A)  Patient Location: PACU  Anesthesia Type:MAC  Level of Consciousness: Patient easily awoken, sedated, comfortable, cooperative, following commands, responds to stimulation.   Airway & Oxygen Therapy: Patient spontaneously breathing, ventilating well, oxygen via simple oxygen mask.  Post-op Assessment: Report given to PACU RN, vital signs reviewed and stable, moving all extremities.   Post vital signs: Reviewed and stable.  Complications: No apparent anesthesia complications

## 2014-08-18 NOTE — H&P (View-Only) (Signed)
Patient ID: Brittany [name], female   DOB: [birth year], 81 y.o.   MRN: [id number]     History of Present Illness: Brittany [name] is an 81 year old female known to Dr. Deatra Ina. She has a history of hypertension, chronic diastolic heart failure, atrial fibrillation, aortic stenosis, status post NSTEMI, GERD, gastritis, ampullary stenosis, and is status post aortic valve replacement. She had an ERCP with sphincterotomy and ERCP with balloon dilation in May 2014. She was last seen here in mid March 2016 with a 3-4 month history of epigastric pain and nausea. She was scheduled for CT of the abdomen which was obtained on 06/24/2014 and had no acute findings. She was noted to have atherosclerosis with SMA and celiac stenosis but no progression from 2013 to explain acute symptoms. She also had a lobulated liver suspicious for cirrhosis. She had a gastric emptying scan in April 12 which was normal. He has been having increasing episodes of heartburn. She continues to complain of epigastric pain that is worse postprandially. Last EGD was in December 2013 and showed nonerosive gastritis and esophagitis. She is currently on Plavix and warfarin. She was recently seen by cardiology in March 2016. She is on Coumadin for her A. fib patient states she was advised to continue her Plavix for at least 6 more months as well.   Past Medical History  Diagnosis Date  . Dyslipidemia     takes Lipitor daily  . Arthritis   . Urine incontinence   . Diverticulosis   . IC (interstitial cystitis)   . S/P aortic valve replacement     2005  . H/O hiatal hernia   . History of TIA (transient ischemic attack)     2013-- RESIDUAL PERIPHERAL VISION RIGHT EYE--  RESOLVED  . History of bacterial endocarditis   . Foot drop     SINCE 1987  . History of gastritis   . Chronic back pain   . Hypertension   . Atrial fibrillation   . GERD (gastroesophageal reflux disease)     Past Surgical History  Procedure Laterality Date  .  Abdominal hysterectomy  1967  . Aortic valve replacement  09-29-2003     [facility] pericardial tissue valve  . Cholecystectomy  1993  . Vein ligation  1969    RIGHT LOWER LEG  . Dilation and curettage of uterus    . Ercp N/A 08/10/2012    Procedure: ENDOSCOPIC RETROGRADE CHOLANGIOPANCREATOGRAPHY (ERCP);  Surgeon: Inda Castle, MD;  Location: [facility];  Service: Gastroenterology;  Laterality: N/A;  . Cataract extraction w/ intraocular lens  implant, bilateral    . Appendectomy  1933  . Cardiac catheterization  07-15-2003 DR HELEN PRESTON    MODERATE TO MODERATELY SEVERE CALCIFIC AORTIC STENOSIS/  NORMAL CORONARY ARTERIES  . Right shoulder arthroscopy w/ debridement rotator cuff and labral tear/ acrominoplasty/ distal clavicle excision/ ca ligament release  10-08-1999  . Cysto/ hydrodistention/ instillation clorpactin  MULTIPLE  last one 2009  . Mini-open right rotator cuff repair  07-12-2001  . Transvaginal tape procedure  07-30-2002  . Lumbar disc surgery  1985 &  2007  . Macroplastique urethral implantation  08-27-2009  . Transthoracic echocardiogram  08-10-2011    MILD LVH/  EF 55-60%/  NORMAL AVR TISSUE/ MILD MR/  MODERATE DILATED LA/  MILD DILATED RA  . Cardiovascular stress test  01-30-2012  DR NASHER    NORMAL NUCLEAR STUDY/  EF 73%/  NORMAL LVF  . Tonsillectomy and adenoidectomy    .  Carpal tunnel release Bilateral   . Cysto with hydrodistension N/A 02/05/2013    Procedure: CYSTOSCOPY/HYDRODISTENSION;  Surgeon: Irine Seal, MD;  Location: [facility];  Service: Urology;  Laterality: N/A;  . Left heart catheterization with coronary angiogram N/A 10/18/2013    Procedure: LEFT HEART CATHETERIZATION WITH CORONARY ANGIOGRAM;  Surgeon: Jettie Booze, MD;  Location: [facility] CATH LAB;  Service: Cardiovascular;  Laterality: N/A;   Family History  Problem Relation Age of Onset  . Heart disease Father   . Coronary artery disease Brother   . Bipolar disorder  Brother     commited suiside at 75yo  . Alcohol abuse Sister     sister #1  . Alcohol abuse Sister     sister #2  . Colon cancer Maternal Aunt   . Stomach cancer Maternal Grandmother    History  Substance Use Topics  . Smoking status: Former Smoker    Types: Cigarettes    Quit date: 03/06/1990  . Smokeless tobacco: Never Used  . Alcohol Use: No   Current Outpatient Prescriptions  Medication Sig Dispense Refill  . atenolol (TENORMIN) 25 MG tablet TAKE 1 TABLET (25 MG TOTAL) BY MOUTH DAILY. 30 tablet 4  . atorvastatin (LIPITOR) 20 MG tablet Take 20 mg by mouth daily.     Marland Kitchen CARTIA XT 120 MG 24 hr capsule TAKE 1 TABLET (120 MG TOTAL) BY MOUTH DAILY. 30 capsule 4  . cholecalciferol (VITAMIN D) 1000 UNITS tablet Take 1,000 Units by mouth daily.    . clopidogrel (PLAVIX) 75 MG tablet Take 1 tablet (75 mg total) by mouth daily. 30 tablet 11  . diazepam (VALIUM) 5 MG tablet every 12 (twelve) hours as needed for muscle spasms.     . furosemide (LASIX) 40 MG tablet Take 1 tablet (40 mg total) by mouth as directed. TAKE 40 MG ON TUES, THUR, SAT, AND SUN (Patient taking differently: Take 40 mg by mouth as needed (Per pt, taking as needed.). TAKE 40 MG ON TUES, THUR, SAT, AND SUN) 30 tablet 11  . HYDROcodone-acetaminophen (NORCO/VICODIN) 5-325 MG per tablet 1 tablet every 6 (six) hours as needed for moderate pain.     . hyoscyamine (ANASPAZ) 0.125 MG TBDP disintergrating tablet Place 0.125 mg under the tongue as needed for bladder spasms.    Marland Kitchen lisinopril (PRINIVIL,ZESTRIL) 20 MG tablet Take 1 tablet (20 mg total) by mouth daily. Pt takes 20mg  two tabs daily 30 tablet 11  . Omega-3 Fatty Acids (FISH OIL PO) Take 1 g by mouth daily.     Marland Kitchen omeprazole (PRILOSEC) 40 MG capsule Take 40 mg by mouth daily.    . ondansetron (ZOFRAN) 8 MG tablet TAKE 1 TABLET (8 MG TOTAL) BY MOUTH EVERY 8 (EIGHT) HOURS AS NEEDED FOR NAUSEA OR VOMITING. 15 tablet 0  . oxybutynin (DITROPAN) 5 MG tablet Take 5 mg by mouth 3  (three) times daily.    . Potassium Chloride ER 20 MEQ TBCR Take 10 mEq by mouth daily. (Patient taking differently: Take 20 mEq by mouth as needed (Per pt taking as needed with Furosemide.). ) 30 tablet 11  . prochlorperazine (COMPAZINE) 5 MG tablet Take 1 tablet by mouth 2 (two) times daily.    . promethazine (PHENERGAN) 25 MG tablet TAKE 1 TABLET (25 MG TOTAL) BY MOUTH EVERY 6 (SIX) HOURS AS NEEDED FOR NAUSEA OR VOMITING. 30 tablet 3  . ranitidine (ZANTAC) 150 MG tablet Take 150 mg by mouth at bedtime.    Marland Kitchen warfarin (  COUMADIN) 5 MG tablet Take 1.5 tablets (7.5 mg total) by mouth daily at 6 PM. (Patient taking differently: Take 7.5 mg by mouth daily at 6 PM. Patient states that she takes 1 tablet by mouth Monday thur Saturday and 1/2 tablet on Sunday.) 30 tablet 5  . zolpidem (AMBIEN) 10 MG tablet Take 10 mg by mouth at bedtime as needed for sleep.    . sucralfate (CARAFATE) 1 GM/10ML suspension Take 10 mLs (1 g total) by mouth 4 (four) times daily -  with meals and at bedtime. 1200 mL 0   No current facility-administered medications for this visit.   Allergies  Allergen Reactions  . Amoxicillin Diarrhea and Nausea And Vomiting  . Cephalexin Diarrhea  . Codeine Nausea Only  . Irbesartan Swelling    Facial and hand numbness and swelling. Patient believes reaction was with this medication but isn't 100% sure. She is currently tolerating lisinopril.  Marland Kitchen Morphine And Related Other (See Comments)    HX ADDICTION / WITHDRAWAL  . Neurontin [Gabapentin] Swelling    Legs swell  . Nitrofurantoin Monohyd Macro Diarrhea and Nausea Only  . Prednisone Nausea Only  . Sulfa Antibiotics Rash      Review of Systems: Per history of present illness otherwise negative     Studies:   Dg Chest 2 View  07/15/2014   CLINICAL DATA:  Chest pressure.  Hypertension.  EXAM: CHEST  2 VIEW  COMPARISON:  None.  FINDINGS: Mediastinum hilar structures are normal. Prior cardiac valve replacement. Cardiomegaly with  normal pulmonary vascularity. Left base pleural parenchymal thickening noted consistent with scarring. No acute bony abnormality identified.  IMPRESSION: 1. No acute cardiopulmonary disease. Mild left base pleural parenchymal thickening consistent with scarring. 2. Prior cardiac valve replacement.  Cardiomegaly.  No CHF.   Electronically Signed   By: Marcello Moores  Register   On: 07/15/2014 10:28   Nm Gastric Emptying  07/22/2014   CLINICAL DATA:  Nausea, vomiting and abdominal pain for 1 year, worsening.  EXAM: NUCLEAR MEDICINE GASTRIC EMPTYING SCAN  TECHNIQUE: After oral ingestion of radiolabeled meal, sequential abdominal images were obtained for 120 minutes. Residual percentage of activity remaining within the stomach was calculated at 60 and 120 minutes.  RADIOPHARMACEUTICALS:  2.2 Technetium 99-m labeled sulfur colloid  COMPARISON:  None.  FINDINGS: Expected location of the stomach in the left upper quadrant. Ingested meal empties the stomach gradually over the course of the study with 48% retention at 60 min and 28.6% retention at 120 min (normal retention less than 30% at a 120 min).  IMPRESSION: Normal gastric emptying study.   Electronically Signed   By: Inge Rise M.D.   On: 07/22/2014 11:56   Images     Show images for CT Abdomen Pelvis W Contrast     Study Result     CLINICAL DATA: Upper abdominal pain, nausea, anorexia.  EXAM: CT ABDOMEN AND PELVIS WITH CONTRAST  TECHNIQUE: Multidetector CT imaging of the abdomen and pelvis was performed using the standard protocol following bolus administration of intravenous contrast.  CONTRAST: 190mL OMNIPAQUE IOHEXOL 300 MG/ML SOLN  COMPARISON: 02/03/2012  FINDINGS: BODY WALL: No contributory findings.  LOWER CHEST: Partially visualized biatrial enlargement. The patient is status post aortic valve replacement. There is bulky mitral annular calcification. Coronary atherosclerosis.  ABDOMEN/PELVIS:  Liver: Subtle  nodularity of the liver surface which is new or progressed from 2013. No focal lesion.  Biliary: Cholecystectomy.  Pancreas: Unremarkable.  Spleen: Unremarkable.  Adrenals: Unremarkable.  Kidneys and ureters:  2 small nonobstructive stones in the upper pole right kidney, up to 3 mm. No hydronephrosis or ureteral calculus.  Bladder: As noted previously, there is a cystic structure interposed between the urethra and vagina with enhancing rim. The cystic component measures 2 cm. On sagittal imaging, there may be a circumferential extension around the urethra, favoring bladder diverticulum. There is new calcification in the right bladder base appears mural and is likely from patient's history of urethral augmentation. This presumed history of incontinence also favors diverticulum.  Reproductive: Hysterectomy and oophorectomies.  Bowel: No obstruction. No pericecal inflammation. Mild distal colonic diverticulosis.  Retroperitoneum: No mass or adenopathy.  Peritoneum: No ascites or pneumoperitoneum.  Vascular: Advanced atherosclerosis of the aorta and branch vessels. There is prominent stenosis of the right renal artery without delayed enhancement. SMA and celiac axis proximal atherosclerotic narrowing without progression from 2013. Ectatic distal abdominal aorta at 23 mm, stable from 2013.  OSSEOUS: Advanced and diffuse lumbar degenerative disc disease, status post lumbosacral fusion.  IMPRESSION: 1. No acute findings. 2. Atherosclerosis with SMA and celiac stenosis but no progression from 2013 to explain acute symptoms. 3. Probable 2 cm urethral diverticulum. 4. Lobulated liver suspicious for cirrhosis. 5. Nonobstructive right nephrolithiasis.   Electronically Signed  By: Monte Fantasia M.D.  On: 06/24/2014 11:08     Physical Exam: General: Pleasant, well developedfemale in no acute distress Head: Normocephalic and atraumatic Eyes:  sclerae  anicteric, conjunctiva pink  Ears: Normal auditory acuity Lungs: Clear throughout to auscultation Heart: Irregularly irregular Abdomen: Soft, non distended, non-tender. No masses, no hepatomegaly. Normal bowel sounds Musculoskeletal: Symmetrical with no gross deformities  Extremities: No edema  Neurological: Alert oriented x 4, grossly nonfocal Psychological:  Alert and cooperative. Normal mood and affect  Assessment and Recommendations:  81 year old female with a history of hypertension, dyslipidemia, aortic valve replacement, CVA, and A. fib as well as gastritis and esophagitis now with 6 months of epigastric pain associated with nausea. CT with some stenosis of SMA and celiac but no progression since 2013. She notes some postprandial pain may be ischemic or due to a recurrent gastritis. Will give patient a trial of Carafate 1 g before meals and at bedtime. Will review with Dr. Deatra Ina as to whether proceeding with diagnostic only EGD on Plavix and Coumadin may be of benefit. Will contact patient with further recommendations.       Jospeh Mangel, Vita Barley PA-C 07/30/2014,

## 2014-08-18 NOTE — Anesthesia Postprocedure Evaluation (Signed)
  Anesthesia Post-op Note  Patient: Brittany [name]  Procedure(s) Performed: Procedure(s) (LRB): ESOPHAGOGASTRODUODENOSCOPY (EGD) WITH PROPOFOL (N/A)  Patient Location: PACU  Anesthesia Type: MAC  Level of Consciousness: awake and alert   Airway and Oxygen Therapy: Patient Spontanous Breathing  Post-op Pain: mild  Post-op Assessment: Post-op Vital signs reviewed, Patient's Cardiovascular Status Stable, Respiratory Function Stable, Patent Airway and No signs of Nausea or vomiting  Last Vitals:  Filed Vitals:   08/18/14 1348  BP:   Pulse:   Temp: 36.8 C  Resp:     Post-op Vital Signs: stable   Complications: No apparent anesthesia complications

## 2014-08-18 NOTE — Interval H&P Note (Signed)
History and Physical Interval Note:  08/18/2014 12:41 PM  Brittany [name]  has presented today for surgery, with the diagnosis of epigastric pain  The various methods of treatment have been discussed with the patient and family. After consideration of risks, benefits and other options for treatment, the patient has consented to  Procedure(s): ESOPHAGOGASTRODUODENOSCOPY (EGD) WITH PROPOFOL (N/A) as a surgical intervention .  The patient's history has been reviewed, patient examined, no change in status, stable for surgery.  I have reviewed the patient's chart and labs.  Questions were answered to the patient's satisfaction.    The recent H&P (dated *07/30/14**) was reviewed, the patient was examined and there is no change in the patients condition since that H&P was completed.   [name]  08/18/2014, 12:41 PM    [name]

## 2014-08-18 NOTE — H&P (View-Only) (Signed)
Reviewed and agree with management.  Agree with diagnostic endoscopy.  She has an iron deficiency anemia.  May consider colonoscopy at the same time.  We'll have to check with cardiology regarding anticoagulants and antiplatelet medications. Sandy Salaam. Deatra Ina, M.D., [facility]

## 2014-08-18 NOTE — Anesthesia Preprocedure Evaluation (Signed)
Anesthesia Evaluation  Patient identified by MRN, date of birth, ID band Patient awake    Reviewed: Allergy & Precautions, NPO status , Patient's Chart, lab work & pertinent test results  Airway Mallampati: II  TM Distance: >3 FB Neck ROM: Full    Dental no notable dental hx.    Pulmonary shortness of breath, former smoker,  breath sounds clear to auscultation  Pulmonary exam normal       Cardiovascular hypertension, Pt. on home beta blockers and Pt. on medications + Past MI and +CHF Normal cardiovascular exam+ dysrhythmias Rhythm:Regular Rate:Normal  S/P aortic valve replacement. ECHO July 2015 reviewed. EF OK. Moderately elevated pulmonary artery pressures.   Neuro/Psych  Headaches, TIAnegative psych ROS   GI/Hepatic Neg liver ROS, hiatal hernia, GERD-  ,  Endo/Other  negative endocrine ROS  Renal/GU negative Renal ROS  negative genitourinary   Musculoskeletal  (+) Arthritis -,   Abdominal   Peds negative pediatric ROS (+)  Hematology  (+) anemia ,   Anesthesia Other Findings   Reproductive/Obstetrics negative OB ROS                             Anesthesia Physical Anesthesia Plan  ASA: III  Anesthesia Plan: MAC   Post-op Pain Management:    Induction: Intravenous  Airway Management Planned: Natural Airway  Additional Equipment:   Intra-op Plan:   Post-operative Plan:   Informed Consent: I have reviewed the patients History and Physical, chart, labs and discussed the procedure including the risks, benefits and alternatives for the proposed anesthesia with the patient or authorized representative who has indicated his/her understanding and acceptance.   Dental advisory given  Plan Discussed with: CRNA  Anesthesia Plan Comments:         Anesthesia Quick Evaluation

## 2014-08-18 NOTE — Op Note (Signed)
[facility] [place], [id number]   ENDOSCOPY PROCEDURE REPORT  PATIENT: Brittany [name], Brittany [name]  MR#: [id number] BIRTHDATE: 1927-08-14 , 60  yrs. old GENDER: female ENDOSCOPIST: Inda Castle, MD REFERRED BY: PROCEDURE DATE:  08/18/2014 PROCEDURE:  EGD w/ biopsy ASA CLASS:     Class III INDICATIONS:  epigastric pain. MEDICATIONS: Monitored anesthesia care TOPICAL ANESTHETIC:  DESCRIPTION OF PROCEDURE: After the risks benefits and alternatives of the procedure were thoroughly explained, informed consent was obtained.  The Pentax Gastroscope [id number] endoscope was introduced through the mouth and advanced to the second portion of the duodenum , Without limitations.  The instrument was slowly withdrawn as the mucosa was fully examined.    In the gastric body and antrum there were areas of deeply reddened mucosa.  Atrophic appearing mucosa appeared the prepyloric antrum. Biopsies were taken.   Except for the findings listed, the EGD was otherwise normal.  Retroflexed views revealed no abnormalities. The scope was then withdrawn from the patient and the procedure completed.  COMPLICATIONS: There were no immediate complications.  ENDOSCOPIC IMPRESSION: 1.   In the gastric body and antrum there were areas of deeply reddened mucosa.  Atrophic appearing mucosa appeared the prepyloric antrum.  Biopsies were taken 2.   EGD was otherwise normal  RECOMMENDATIONS: Await biopsy results DC Carafate  REPEAT EXAM:  eSigned:  Inda Castle, MD 08/18/2014 1:06 PM    CC:

## 2014-08-18 NOTE — Interval H&P Note (Signed)
History and Physical Interval Note:  08/18/2014 12:39 PM  [name]  has presented today for surgery, with the diagnosis of epigastric pain  The various methods of treatment have been discussed with the patient and family. After consideration of risks, benefits and other options for treatment, the patient has consented to  Procedure(s): ESOPHAGOGASTRODUODENOSCOPY (EGD) WITH PROPOFOL (N/A) as a surgical intervention .  The patient's history has been reviewed, patient examined, no change in status, stable for surgery.  I have reviewed the patient's chart and labs.  Questions were answered to the patient's satisfaction.    The recent H&P (dated **07/19/14*) was reviewed, the patient was examined and there is no change in the patients condition since that H&P was completed.   [name]  08/18/2014, 12:39 PM    [name]

## 2014-08-18 NOTE — Discharge Instructions (Signed)
Gastrointestinal Endoscopy, Care After °Refer to this sheet in the next few weeks. These instructions provide you with information on caring for yourself after your procedure. Your caregiver may also give you more specific instructions. Your treatment has been planned according to current medical practices, but problems sometimes occur. Call your caregiver if you have any problems or questions after your procedure. °HOME CARE INSTRUCTIONS °· If you were given medicine to help you relax (sedative), do not drive, operate machinery, or sign important documents for 24 hours. °· Avoid alcohol and hot or warm beverages for the first 24 hours after the procedure. °· Only take over-the-counter or prescription medicines for pain, discomfort, or fever as directed by your caregiver. You may resume taking your normal medicines unless your caregiver tells you otherwise. Ask your caregiver when you may resume taking medicines that may cause bleeding, such as aspirin, clopidogrel, or warfarin. °· You may return to your normal diet and activities on the day after your procedure, or as directed by your caregiver. Walking may help to reduce any bloated feeling in your abdomen. °· Drink enough fluids to keep your urine clear or pale yellow. °· You may gargle with salt water if you have a sore throat. °SEEK IMMEDIATE MEDICAL CARE IF: °· You have severe nausea or vomiting. °· You have severe abdominal pain, abdominal cramps that last longer than 6 hours, or abdominal swelling (distention). °· You have severe shoulder or back pain. °· You have trouble swallowing. °· You have shortness of breath, your breathing is shallow, or you are breathing faster than normal. °· You have a fever or a rapid heartbeat. °· You vomit blood or material that looks like coffee grounds. °· You have bloody, black, or tarry stools. °MAKE SURE YOU: °· Understand these instructions. °· Will watch your condition. °· Will get help right away if you are not doing  well or get worse. °Document Released: 11/10/2003 Document Revised: 08/12/2013 Document Reviewed: 06/28/2011 °Exit[employer]® Patient Information ©2015 [employer]. This information is not intended to replace advice given to you by your health care provider. Make sure you discuss any questions you have with your health care provider. ° °

## 2014-08-19 ENCOUNTER — Encounter: Payer: Self-pay | Admitting: Gastroenterology

## 2014-08-19 ENCOUNTER — Encounter (HOSPITAL_COMMUNITY): Payer: Self-pay | Admitting: Gastroenterology

## 2014-08-20 ENCOUNTER — Encounter (HOSPITAL_COMMUNITY): Payer: Self-pay

## 2014-08-21 ENCOUNTER — Ambulatory Visit (INDEPENDENT_AMBULATORY_CARE_PROVIDER_SITE_OTHER): Payer: Medicare Other | Admitting: *Deleted

## 2014-08-21 DIAGNOSIS — I480 Paroxysmal atrial fibrillation: Secondary | ICD-10-CM

## 2014-08-21 DIAGNOSIS — I4891 Unspecified atrial fibrillation: Secondary | ICD-10-CM | POA: Diagnosis not present

## 2014-08-21 DIAGNOSIS — M542 Cervicalgia: Secondary | ICD-10-CM | POA: Diagnosis not present

## 2014-08-21 DIAGNOSIS — Z6828 Body mass index (BMI) 28.0-28.9, adult: Secondary | ICD-10-CM | POA: Diagnosis not present

## 2014-08-21 DIAGNOSIS — I214 Non-ST elevation (NSTEMI) myocardial infarction: Secondary | ICD-10-CM | POA: Diagnosis not present

## 2014-08-21 DIAGNOSIS — M5416 Radiculopathy, lumbar region: Secondary | ICD-10-CM | POA: Diagnosis not present

## 2014-08-21 DIAGNOSIS — M4692 Unspecified inflammatory spondylopathy, cervical region: Secondary | ICD-10-CM | POA: Diagnosis not present

## 2014-08-21 DIAGNOSIS — M545 Low back pain: Secondary | ICD-10-CM | POA: Diagnosis not present

## 2014-08-21 LAB — POCT INR: INR: 3.7

## 2014-08-22 ENCOUNTER — Encounter (HOSPITAL_COMMUNITY): Payer: Self-pay

## 2014-08-22 ENCOUNTER — Telehealth: Payer: Self-pay | Admitting: Gastroenterology

## 2014-08-22 MED ORDER — OMEPRAZOLE 40 MG PO CPDR: 40.0000 mg | DELAYED_RELEASE_CAPSULE | Freq: Two times a day (BID) | ORAL | Status: DC

## 2014-08-22 NOTE — Telephone Encounter (Signed)
PPI only

## 2014-08-22 NOTE — Telephone Encounter (Signed)
Switch to dexilant 60 mg every morning.  She can take a second dose at bedtime if necessary.  Call back in 5-7 days

## 2014-08-22 NOTE — Telephone Encounter (Signed)
Bx of the stomach showed inflammatory changes. Carafate was stopped. She takes Plavix and Warfarin. Does she remain on the PPI and H2?

## 2014-08-22 NOTE — Telephone Encounter (Signed)
She is not feeling any better. She continues to have spells of nausea. She wakes up with indigestion some mornings. There is a pressure pain at in her chest after she eats. She is compliant with Prilosec, taking it every morning on an empty stomach. She says she takes it at bedtime also.

## 2014-08-25 ENCOUNTER — Other Ambulatory Visit: Payer: Self-pay

## 2014-08-25 ENCOUNTER — Telehealth: Payer: Self-pay | Admitting: Gastroenterology

## 2014-08-25 ENCOUNTER — Encounter (HOSPITAL_COMMUNITY): Payer: Self-pay

## 2014-08-25 MED ORDER — PANTOPRAZOLE SODIUM 40 MG PO TBEC: 40.0000 mg | DELAYED_RELEASE_TABLET | Freq: Every day | ORAL | Status: DC

## 2014-08-25 MED ORDER — DEXLANSOPRAZOLE 60 MG PO CPDR: 60.0000 mg | DELAYED_RELEASE_CAPSULE | Freq: Every day | ORAL | Status: DC

## 2014-08-25 NOTE — Telephone Encounter (Signed)
Try Protonix 40 mg daily

## 2014-08-25 NOTE — Telephone Encounter (Signed)
Her insurance pays for half of the prescription, leaving her co-pay as $300.00. She is unable to pay this monthly and asks for a different option to treatment her symptoms with.

## 2014-08-25 NOTE — Telephone Encounter (Signed)
Patient contacted and advised. She agrees to try Dexilant. She states she has "stayed in bed for the past 3 days with nausea and pain in her gut". She is taking her Hydrocodone and Zofran. She declines to go to the ER. She states she will try Dexilant. No bowel movement changes, no vomiting. States she is not eating and has only had 2 pieces of toast today.

## 2014-08-25 NOTE — Telephone Encounter (Signed)
Patient advised of the new rx.

## 2014-08-27 ENCOUNTER — Emergency Department (HOSPITAL_COMMUNITY): Payer: Medicare Other

## 2014-08-27 ENCOUNTER — Inpatient Hospital Stay (HOSPITAL_COMMUNITY)
Admission: EM | Admit: 2014-08-27 | Discharge: 2014-08-30 | DRG: 392 | Disposition: A | Discharge status: Home / Self Care | Payer: Medicare Other | Attending: Internal Medicine | Admitting: Internal Medicine

## 2014-08-27 ENCOUNTER — Encounter (HOSPITAL_COMMUNITY): Payer: Self-pay | Admitting: Emergency Medicine

## 2014-08-27 ENCOUNTER — Encounter (HOSPITAL_COMMUNITY): Payer: Self-pay

## 2014-08-27 DIAGNOSIS — I482 Chronic atrial fibrillation: Secondary | ICD-10-CM | POA: Diagnosis present

## 2014-08-27 DIAGNOSIS — R1013 Epigastric pain: Secondary | ICD-10-CM | POA: Diagnosis not present

## 2014-08-27 DIAGNOSIS — Z953 Presence of xenogenic heart valve: Secondary | ICD-10-CM | POA: Diagnosis not present

## 2014-08-27 DIAGNOSIS — Z90710 Acquired absence of both cervix and uterus: Secondary | ICD-10-CM

## 2014-08-27 DIAGNOSIS — N3010 Interstitial cystitis (chronic) without hematuria: Secondary | ICD-10-CM | POA: Diagnosis present

## 2014-08-27 DIAGNOSIS — M1990 Unspecified osteoarthritis, unspecified site: Secondary | ICD-10-CM | POA: Diagnosis present

## 2014-08-27 DIAGNOSIS — Z7902 Long term (current) use of antithrombotics/antiplatelets: Secondary | ICD-10-CM

## 2014-08-27 DIAGNOSIS — Z87891 Personal history of nicotine dependence: Secondary | ICD-10-CM | POA: Diagnosis not present

## 2014-08-27 DIAGNOSIS — R1110 Vomiting, unspecified: Secondary | ICD-10-CM

## 2014-08-27 DIAGNOSIS — E860 Dehydration: Secondary | ICD-10-CM | POA: Diagnosis present

## 2014-08-27 DIAGNOSIS — Z880 Allergy status to penicillin: Secondary | ICD-10-CM

## 2014-08-27 DIAGNOSIS — Z8249 Family history of ischemic heart disease and other diseases of the circulatory system: Secondary | ICD-10-CM | POA: Diagnosis not present

## 2014-08-27 DIAGNOSIS — Z952 Presence of prosthetic heart valve: Secondary | ICD-10-CM | POA: Diagnosis not present

## 2014-08-27 DIAGNOSIS — E871 Hypo-osmolality and hyponatremia: Secondary | ICD-10-CM | POA: Diagnosis present

## 2014-08-27 DIAGNOSIS — Z882 Allergy status to sulfonamides status: Secondary | ICD-10-CM

## 2014-08-27 DIAGNOSIS — R100 Acute abdomen: Secondary | ICD-10-CM | POA: Diagnosis not present

## 2014-08-27 DIAGNOSIS — Z8673 Personal history of transient ischemic attack (TIA), and cerebral infarction without residual deficits: Secondary | ICD-10-CM

## 2014-08-27 DIAGNOSIS — Z79899 Other long term (current) drug therapy: Secondary | ICD-10-CM

## 2014-08-27 DIAGNOSIS — Z885 Allergy status to narcotic agent status: Secondary | ICD-10-CM | POA: Diagnosis not present

## 2014-08-27 DIAGNOSIS — Z888 Allergy status to other drugs, medicaments and biological substances status: Secondary | ICD-10-CM | POA: Diagnosis not present

## 2014-08-27 DIAGNOSIS — H9190 Unspecified hearing loss, unspecified ear: Secondary | ICD-10-CM | POA: Diagnosis present

## 2014-08-27 DIAGNOSIS — I4891 Unspecified atrial fibrillation: Secondary | ICD-10-CM | POA: Diagnosis present

## 2014-08-27 DIAGNOSIS — Z8711 Personal history of peptic ulcer disease: Secondary | ICD-10-CM | POA: Diagnosis not present

## 2014-08-27 DIAGNOSIS — I10 Essential (primary) hypertension: Secondary | ICD-10-CM | POA: Diagnosis not present

## 2014-08-27 DIAGNOSIS — Z881 Allergy status to other antibiotic agents status: Secondary | ICD-10-CM | POA: Diagnosis not present

## 2014-08-27 DIAGNOSIS — Z800 Family history of malignant neoplasm of digestive organs: Secondary | ICD-10-CM

## 2014-08-27 DIAGNOSIS — I252 Old myocardial infarction: Secondary | ICD-10-CM

## 2014-08-27 DIAGNOSIS — Z79891 Long term (current) use of opiate analgesic: Secondary | ICD-10-CM

## 2014-08-27 DIAGNOSIS — K219 Gastro-esophageal reflux disease without esophagitis: Secondary | ICD-10-CM | POA: Diagnosis present

## 2014-08-27 DIAGNOSIS — Z7901 Long term (current) use of anticoagulants: Secondary | ICD-10-CM

## 2014-08-27 DIAGNOSIS — R112 Nausea with vomiting, unspecified: Secondary | ICD-10-CM | POA: Diagnosis not present

## 2014-08-27 DIAGNOSIS — K5900 Constipation, unspecified: Secondary | ICD-10-CM | POA: Diagnosis present

## 2014-08-27 DIAGNOSIS — E785 Hyperlipidemia, unspecified: Secondary | ICD-10-CM | POA: Diagnosis present

## 2014-08-27 DIAGNOSIS — R110 Nausea: Secondary | ICD-10-CM | POA: Diagnosis not present

## 2014-08-27 DIAGNOSIS — K2950 Unspecified chronic gastritis without bleeding: Principal | ICD-10-CM | POA: Diagnosis present

## 2014-08-27 DIAGNOSIS — I5032 Chronic diastolic (congestive) heart failure: Secondary | ICD-10-CM | POA: Diagnosis present

## 2014-08-27 DIAGNOSIS — Z9861 Coronary angioplasty status: Secondary | ICD-10-CM | POA: Diagnosis not present

## 2014-08-27 DIAGNOSIS — R109 Unspecified abdominal pain: Secondary | ICD-10-CM | POA: Diagnosis not present

## 2014-08-27 DIAGNOSIS — I2510 Atherosclerotic heart disease of native coronary artery without angina pectoris: Secondary | ICD-10-CM | POA: Diagnosis present

## 2014-08-27 LAB — TROPONIN I: Troponin I: <0.03 ng/mL (ref <0.031)

## 2014-08-27 LAB — COMPREHENSIVE METABOLIC PANEL
ALBUMIN: 3.3 g/dL — AB (ref 3.5–5.0)
ALT: 19 U/L (ref 14–54)
AST: 27 U/L (ref 15–41)
Alkaline Phosphatase: 70 U/L (ref 38–126)
Anion gap: 4 — ABNORMAL LOW (ref 5–15)
BILIRUBIN TOTAL: 0.7 mg/dL (ref 0.3–1.2)
BUN: 14 mg/dL (ref 6–20)
CALCIUM: 8.5 mg/dL — AB (ref 8.9–10.3)
CHLORIDE: 95 mmol/L — AB (ref 101–111)
CO2: 25 mmol/L (ref 22–32)
CREATININE: 0.55 mg/dL (ref 0.44–1.00)
GFR calc Af Amer: >60 mL/min (ref >60)
GFR calc non Af Amer: >60 mL/min (ref >60)
Glucose, Bld: 110 mg/dL — ABNORMAL HIGH (ref 65–99)
POTASSIUM: 4.1 mmol/L (ref 3.5–5.1)
SODIUM: 124 mmol/L — AB (ref 135–145)
Total Protein: 6.7 g/dL (ref 6.5–8.1)

## 2014-08-27 LAB — URINE MICROSCOPIC-ADD ON

## 2014-08-27 LAB — CBC WITH DIFFERENTIAL/PLATELET
BASOS ABS: 0 10*3/uL (ref 0.0–0.1)
Basophils Relative: 0 % (ref 0–1)
EOS ABS: 0.1 10*3/uL (ref 0.0–0.7)
Eosinophils Relative: 1 % (ref 0–5)
HCT: 34.1 % — ABNORMAL LOW (ref 36.0–46.0)
Hemoglobin: 11 g/dL — ABNORMAL LOW (ref 12.0–15.0)
LYMPHS PCT: 12 % (ref 12–46)
Lymphs Abs: 1.3 10*3/uL (ref 0.7–4.0)
MCH: 25.9 pg — AB (ref 26.0–34.0)
MCHC: 32.3 g/dL (ref 30.0–36.0)
MCV: 80.4 fL (ref 78.0–100.0)
Monocytes Absolute: 1.5 10*3/uL — ABNORMAL HIGH (ref 0.1–1.0)
Monocytes Relative: 14 % — ABNORMAL HIGH (ref 3–12)
Neutro Abs: 8 10*3/uL — ABNORMAL HIGH (ref 1.7–7.7)
Neutrophils Relative %: 73 % (ref 43–77)
Platelets: 220 10*3/uL (ref 150–400)
RBC: 4.24 MIL/uL (ref 3.87–5.11)
RDW: 17.6 % — AB (ref 11.5–15.5)
WBC: 11 10*3/uL — ABNORMAL HIGH (ref 4.0–10.5)

## 2014-08-27 LAB — URINALYSIS, ROUTINE W REFLEX MICROSCOPIC
Bilirubin Urine: NEGATIVE
Glucose, UA: NEGATIVE mg/dL
Ketones, ur: NEGATIVE mg/dL
Leukocytes, UA: NEGATIVE
Nitrite: NEGATIVE
Protein, ur: NEGATIVE mg/dL
Specific Gravity, Urine: 1.005 (ref 1.005–1.030)
UROBILINOGEN UA: 0.2 mg/dL (ref 0.0–1.0)
pH: 6.5 (ref 5.0–8.0)

## 2014-08-27 LAB — APTT: aPTT: 37 seconds (ref 24–37)

## 2014-08-27 LAB — LIPASE, BLOOD: LIPASE: 25 U/L (ref 22–51)

## 2014-08-27 LAB — PROTIME-INR
INR: 2 — ABNORMAL HIGH (ref 0.00–1.49)
PROTHROMBIN TIME: 22.9 s — AB (ref 11.6–15.2)

## 2014-08-27 LAB — I-STAT CG4 LACTIC ACID, ED: LACTIC ACID, VENOUS: 0.56 mmol/L (ref 0.5–2.0)

## 2014-08-27 LAB — BRAIN NATRIURETIC PEPTIDE: B NATRIURETIC PEPTIDE 5: 367.2 pg/mL — AB (ref 0.0–100.0)

## 2014-08-27 MED ORDER — CLOPIDOGREL BISULFATE 75 MG PO TABS
75.0000 mg | ORAL_TABLET | Freq: Every day | ORAL | Status: DC
  Administered 2014-08-28 – 2014-08-30 (×3): 75 mg via ORAL
  Filled 2014-08-27 (×4): qty 1

## 2014-08-27 MED ORDER — ATENOLOL 25 MG PO TABS
25.0000 mg | ORAL_TABLET | Freq: Every day | ORAL | Status: DC
  Administered 2014-08-28 – 2014-08-30 (×3): 25 mg via ORAL
  Filled 2014-08-27 (×3): qty 1

## 2014-08-27 MED ORDER — DILTIAZEM HCL ER COATED BEADS 120 MG PO CP24
120.0000 mg | ORAL_CAPSULE | Freq: Every day | ORAL | Status: DC
  Administered 2014-08-28 – 2014-08-30 (×3): 120 mg via ORAL
  Filled 2014-08-27 (×3): qty 1

## 2014-08-27 MED ORDER — LORAZEPAM 2 MG/ML IJ SOLN
0.5000 mg | Freq: Once | INTRAMUSCULAR | Status: AC
  Administered 2014-08-27: 0.5 mg via INTRAVENOUS
  Filled 2014-08-27: qty 1

## 2014-08-27 MED ORDER — LORAZEPAM 2 MG/ML IJ SOLN: 0.5000 mg | Freq: Four times a day (QID) | INTRAMUSCULAR | Status: DC | PRN

## 2014-08-27 MED ORDER — LISINOPRIL 20 MG PO TABS
20.0000 mg | ORAL_TABLET | Freq: Every day | ORAL | Status: DC
  Administered 2014-08-28 – 2014-08-30 (×3): 20 mg via ORAL
  Filled 2014-08-27 (×3): qty 1

## 2014-08-27 MED ORDER — HYOSCYAMINE SULFATE 0.125 MG PO TBDP
0.1250 mg | ORAL_TABLET | ORAL | Status: DC | PRN
  Filled 2014-08-27: qty 1

## 2014-08-27 MED ORDER — ATORVASTATIN CALCIUM 20 MG PO TABS
20.0000 mg | ORAL_TABLET | Freq: Every morning | ORAL | Status: DC
  Administered 2014-08-27 – 2014-08-29 (×3): 20 mg via ORAL
  Filled 2014-08-27 (×4): qty 1

## 2014-08-27 MED ORDER — PANTOPRAZOLE SODIUM 40 MG PO TBEC
40.0000 mg | DELAYED_RELEASE_TABLET | Freq: Every day | ORAL | Status: DC
  Administered 2014-08-28 – 2014-08-30 (×3): 40 mg via ORAL
  Filled 2014-08-27 (×3): qty 1

## 2014-08-27 MED ORDER — OXYBUTYNIN CHLORIDE 5 MG PO TABS
5.0000 mg | ORAL_TABLET | Freq: Three times a day (TID) | ORAL | Status: DC
  Administered 2014-08-27 – 2014-08-30 (×8): 5 mg via ORAL
  Filled 2014-08-27 (×10): qty 1

## 2014-08-27 MED ORDER — SODIUM CHLORIDE 0.9 % IV BOLUS (SEPSIS)
500.0000 mL | Freq: Once | INTRAVENOUS | Status: AC
  Administered 2014-08-27: 500 mL via INTRAVENOUS

## 2014-08-27 MED ORDER — SODIUM CHLORIDE 0.9 % IV SOLN
INTRAVENOUS | Status: DC
  Administered 2014-08-27: 22:00:00 via INTRAVENOUS

## 2014-08-27 MED ORDER — HYDROCODONE-ACETAMINOPHEN 5-325 MG PO TABS
1.0000 | ORAL_TABLET | Freq: Four times a day (QID) | ORAL | Status: DC | PRN
  Administered 2014-08-29 (×2): 1 via ORAL
  Filled 2014-08-27 (×2): qty 1

## 2014-08-27 NOTE — ED Notes (Signed)
Pt states that she has had stomach issues since January and has had numerous studies but since Saturday she has had nausea. Denies vomiting. Only able to drink water. Had endoscopy 8 days ago. Alert and oriented.

## 2014-08-27 NOTE — Telephone Encounter (Signed)
OK to take protonix.  Continue zofran Q6-8h prn

## 2014-08-27 NOTE — ED Provider Notes (Signed)
CSN: [id number]     Arrival date & time 08/27/14  1751 History   First MD Initiated Contact with Patient 08/27/14 1815     Chief Complaint  Patient presents with  . Abdominal Pain     (Consider location/radiation/quality/duration/timing/severity/associated sxs/prior Treatment) The history is provided by the patient. No language interpreter was used.  Brittany [name] is an 81 year old female with past medical history of dyslipidemia, arthritis, urinary incontinence, diverticulosis, status post aortic valve replacement, TIA, gastritis, stomach ulcers, chronic back pain presenting to the ED with nausea for 5 days. Patient reported that she is seen by gastroenterology, Dr. Deatra Ina, who called in Zofran and protonic approximate 2-3 days ago. Patient reported that this is not been helping. Reported that she's been having nausea with dry he is, reported that she has not been able to keep any food or fluids down. Patient reports she's been having mild epigastric pain described as a stabbing sensation that comes and goes without radiation. Stated that she has been belching a lot. Reported that she had an endoscopy approximately 8 days ago with inflammation noted, biopsy performed with negative findings. Denied diarrhea, chest pain, shortness of breath, difficulty breathing, melena, hematochezia, decreased urination, dysuria, back pain, neck pain, fever, chills, fainting, travel, sick contacts. PCP Dr. Brigitte Pulse  Past Medical History  Diagnosis Date  . Dyslipidemia     takes Lipitor daily  . Arthritis   . Urine incontinence   . Diverticulosis   . IC (interstitial cystitis)   . S/P aortic valve replacement     2005  . H/O hiatal hernia   . History of TIA (transient ischemic attack)     2013-- RESIDUAL PERIPHERAL VISION RIGHT EYE--  RESOLVED  . History of bacterial endocarditis   . Foot drop     SINCE 1987  . History of gastritis   . Chronic back pain   . Hypertension   . Atrial fibrillation   . GERD  (gastroesophageal reflux disease)   . Hearing impaired     Bilateral hearing aids  . History of stomach ulcers     many yrs ago  . Dysrhythmia     Atrial Fibrillation   Past Surgical History  Procedure Laterality Date  . Abdominal hysterectomy  1967  . Aortic valve replacement  09-29-2003     [facility] pericardial tissue valve  . Cholecystectomy  1993  . Vein ligation  1969    RIGHT LOWER LEG  . Dilation and curettage of uterus    . Ercp N/A 08/10/2012    Procedure: ENDOSCOPIC RETROGRADE CHOLANGIOPANCREATOGRAPHY (ERCP);  Surgeon: Inda Castle, MD;  Location: [facility];  Service: Gastroenterology;  Laterality: N/A;  . Cataract extraction w/ intraocular lens  implant, bilateral    . Appendectomy  1933  . Cardiac catheterization  07-15-2003 DR HELEN PRESTON    MODERATE TO MODERATELY SEVERE CALCIFIC AORTIC STENOSIS/  NORMAL CORONARY ARTERIES  . Right shoulder arthroscopy w/ debridement rotator cuff and labral tear/ acrominoplasty/ distal clavicle excision/ ca ligament release  10-08-1999  . Cysto/ hydrodistention/ instillation clorpactin  MULTIPLE  last one 2009  . Mini-open right rotator cuff repair  07-12-2001  . Transvaginal tape procedure  07-30-2002  . Lumbar disc surgery  1985 &  2007  . Macroplastique urethral implantation  08-27-2009  . Transthoracic echocardiogram  08-10-2011    MILD LVH/  EF 55-60%/  NORMAL AVR TISSUE/ MILD MR/  MODERATE DILATED LA/  MILD DILATED RA  . Cardiovascular stress test  01-30-2012  DR Cathie Olden    NORMAL NUCLEAR STUDY/  EF 73%/  NORMAL LVF  . Tonsillectomy and adenoidectomy    . Carpal tunnel release Bilateral   . Cysto with hydrodistension N/A 02/05/2013    Procedure: CYSTOSCOPY/HYDRODISTENSION;  Surgeon: Irine Seal, MD;  Location: [facility];  Service: Urology;  Laterality: N/A;  . Left heart catheterization with coronary angiogram N/A 10/18/2013    Procedure: LEFT HEART CATHETERIZATION WITH CORONARY ANGIOGRAM;  Surgeon:  Jettie Booze, MD;  Location: [facility] CATH LAB;  Service: Cardiovascular;  Laterality: N/A;  . Esophagogastroduodenoscopy (egd) with propofol N/A 08/18/2014    Procedure: ESOPHAGOGASTRODUODENOSCOPY (EGD) WITH PROPOFOL;  Surgeon: Inda Castle, MD;  Location: [facility];  Service: Endoscopy;  Laterality: N/A;   Family History  Problem Relation Age of Onset  . Heart disease Father   . Coronary artery disease Brother   . Bipolar disorder Brother     commited suiside at 70yo  . Alcohol abuse Sister     sister #1  . Alcohol abuse Sister     sister #2  . Colon cancer Maternal Aunt   . Stomach cancer Maternal Grandmother   . Esophageal cancer Neg Hx    History  Substance Use Topics  . Smoking status: Former Smoker    Types: Cigarettes    Quit date: 03/06/1990  . Smokeless tobacco: Never Used  . Alcohol Use: No   OB History    No data available     Review of Systems  Constitutional: Negative for fever and chills.  Eyes: Negative for visual disturbance.  Respiratory: Negative for chest tightness and shortness of breath.   Cardiovascular: Negative for chest pain.  Gastrointestinal: Positive for nausea. Negative for vomiting, abdominal pain, diarrhea, constipation, blood in stool and anal bleeding.  Genitourinary: Negative for dysuria and decreased urine volume.  Musculoskeletal: Negative for back pain, neck pain and neck stiffness.  Neurological: Positive for dizziness. Negative for weakness and numbness.      Allergies  Amlodipine; Amoxicillin; Carafate; Cephalexin; Codeine; Irbesartan; Morphine and related; Neurontin; Nitrofurantoin monohyd macro; Prednisone; and Sulfa antibiotics  Home Medications   Prior to Admission medications   Medication Sig Start Date End Date Taking? Authorizing Provider  atenolol (TENORMIN) 25 MG tablet TAKE 1 TABLET (25 MG TOTAL) BY MOUTH DAILY. 08/15/14  Yes Thayer Headings, MD  atorvastatin (LIPITOR) 20 MG tablet Take 20 mg by mouth every  morning.    Yes Historical Provider, MD  CARTIA XT 120 MG 24 hr capsule TAKE 1 TABLET (120 MG TOTAL) BY MOUTH DAILY. 08/15/14  Yes Thayer Headings, MD  cholecalciferol (VITAMIN D) 1000 UNITS tablet Take 1,000 Units by mouth every morning.    Yes Historical Provider, MD  clopidogrel (PLAVIX) 75 MG tablet Take 1 tablet (75 mg total) by mouth daily. 01/20/14  Yes Scott T Kathlen Mody, PA-C  diazepam (VALIUM) 5 MG tablet Take 5 mg by mouth every 12 (twelve) hours as needed for muscle spasms.  10/03/13  Yes Historical Provider, MD  furosemide (LASIX) 40 MG tablet Take 1 tablet (40 mg total) by mouth as directed. TAKE 40 MG ON TUES, THUR, SAT, AND SUN Patient taking differently: Take 40 mg by mouth daily as needed for fluid or edema. TAKE 40 MG ON TUES, THUR, SAT, AND SUN 01/20/14  Yes Liliane Shi, PA-C  HYDROcodone-acetaminophen (NORCO/VICODIN) 5-325 MG per tablet 1 tablet every 6 (six) hours as needed for moderate pain.  10/03/13  Yes Historical Provider, MD  hyoscyamine (ANASPAZ) 0.125 MG TBDP disintergrating tablet Place 0.125 mg under the tongue as needed for bladder spasms.   Yes Historical Provider, MD  lisinopril (PRINIVIL,ZESTRIL) 20 MG tablet Take 1 tablet (20 mg total) by mouth daily. Pt takes 20mg  two tabs daily Patient taking differently: Take 20 mg by mouth daily.  01/20/14  Yes Liliane Shi, PA-C  Omega-3 Fatty Acids (FISH OIL PO) Take 1 g by mouth every morning.    Yes Historical Provider, MD  ondansetron (ZOFRAN) 8 MG tablet TAKE 1 TABLET (8 MG TOTAL) BY MOUTH EVERY 8 (EIGHT) HOURS AS NEEDED FOR NAUSEA OR VOMITING. 06/30/14  Yes Lori P Hvozdovic, PA-C  oxybutynin (DITROPAN) 5 MG tablet Take 5 mg by mouth 3 (three) times daily.   Yes Historical Provider, MD  pantoprazole (PROTONIX) 40 MG tablet Take 1 tablet (40 mg total) by mouth daily. 08/25/14  Yes Inda Castle, MD  Potassium Chloride ER 20 MEQ TBCR Take 10 mEq by mouth daily. Patient taking differently: Take 20 mEq by mouth daily as needed  (Per pt taking as needed with Furosemide.).  01/20/14  Yes Scott Joylene Draft, PA-C  promethazine (PHENERGAN) 25 MG tablet TAKE 1 TABLET (25 MG TOTAL) BY MOUTH EVERY 6 (SIX) HOURS AS NEEDED FOR NAUSEA OR VOMITING. 12/19/13  Yes Inda Castle, MD  warfarin (COUMADIN) 5 MG tablet Take 1.5 tablets (7.5 mg total) by mouth daily at 6 PM. Patient taking differently: Take 5 mg by mouth daily at 6 PM. Patient states that she takes 1(5mg ) tablet by mouth Monday -Saturday and 1/2  tablet (2.5mg ) on Sunday.--in the PM 10/21/13  Yes Brett Canales, PA-C  zolpidem (AMBIEN) 10 MG tablet Take 10 mg by mouth at bedtime as needed for sleep.   Yes Historical Provider, MD  ferrous sulfate 325 (65 FE) MG tablet Take 325 mg by mouth daily with breakfast.    Historical Provider, MD   BP 166/82 mmHg  Pulse 75  Temp(Src) 98.1 F (36.7 C) (Oral)  Resp 16  SpO2 95% Physical Exam  Constitutional: She is oriented to person, place, and time. She appears well-developed and well-nourished. No distress.  HENT:  Head: Normocephalic and atraumatic.  Mouth/Throat: Oropharynx is clear and moist. No oropharyngeal exudate.  Eyes: Conjunctivae and EOM are normal. Pupils are equal, round, and reactive to light. Right eye exhibits no discharge. Left eye exhibits no discharge.  Neck: Normal range of motion. Neck supple. No tracheal deviation present.  Cardiovascular: Normal rate, regular rhythm and normal heart sounds.  Exam reveals no friction rub.   No murmur heard. Pulses:      Radial pulses are 2+ on the right side, and 2+ on the left side.       Dorsalis pedis pulses are 2+ on the right side, and 2+ on the left side.  Refill less than 3 seconds Mild swelling identified to left lower extremity with positive pitting edema, 1+ on the dorsal aspect of the foot  Pulmonary/Chest: Effort normal and breath sounds normal. No respiratory distress. She has no wheezes. She has no rales.  Abdominal: Soft. Bowel sounds are normal. She exhibits  no distension. There is tenderness. There is no rebound and no guarding.  Musculoskeletal: Normal range of motion.  Lymphadenopathy:    She has no cervical adenopathy.  Neurological: She is alert and oriented to person, place, and time. No cranial nerve deficit. She exhibits normal muscle tone. Coordination normal. GCS eye subscore is 4. GCS verbal subscore is  5. GCS motor subscore is 6.  Skin: Skin is warm and dry. No rash noted. She is not diaphoretic. No erythema.  Psychiatric: She has a normal mood and affect. Her behavior is normal. Thought content normal.  Nursing note and vitals reviewed.   ED Course  Procedures (including critical care time)  Results for orders placed or performed during the hospital encounter of 08/27/14  CBC with Differential/Platelet  Result Value Ref Range   WBC 11.0 (H) 4.0 - 10.5 K/uL   RBC 4.24 3.87 - 5.11 MIL/uL   Hemoglobin 11.0 (L) 12.0 - 15.0 g/dL   HCT 34.1 (L) 36.0 - 46.0 %   MCV 80.4 78.0 - 100.0 fL   MCH 25.9 (L) 26.0 - 34.0 pg   MCHC 32.3 30.0 - 36.0 g/dL   RDW 17.6 (H) 11.5 - 15.5 %   Platelets 220 150 - 400 K/uL   Neutrophils Relative % 73 43 - 77 %   Neutro Abs 8.0 (H) 1.7 - 7.7 K/uL   Lymphocytes Relative 12 12 - 46 %   Lymphs Abs 1.3 0.7 - 4.0 K/uL   Monocytes Relative 14 (H) 3 - 12 %   Monocytes Absolute 1.5 (H) 0.1 - 1.0 K/uL   Eosinophils Relative 1 0 - 5 %   Eosinophils Absolute 0.1 0.0 - 0.7 K/uL   Basophils Relative 0 0 - 1 %   Basophils Absolute 0.0 0.0 - 0.1 K/uL  Comprehensive metabolic panel  Result Value Ref Range   Sodium 124 (L) 135 - 145 mmol/L   Potassium 4.1 3.5 - 5.1 mmol/L   Chloride 95 (L) 101 - 111 mmol/L   CO2 25 22 - 32 mmol/L   Glucose, Bld 110 (H) 65 - 99 mg/dL   BUN 14 6 - 20 mg/dL   Creatinine, Ser 0.55 0.44 - 1.00 mg/dL   Calcium 8.5 (L) 8.9 - 10.3 mg/dL   Total Protein 6.7 6.5 - 8.1 g/dL   Albumin 3.3 (L) 3.5 - 5.0 g/dL   AST 27 15 - 41 U/L   ALT 19 14 - 54 U/L   Alkaline Phosphatase 70 38 - 126  U/L   Total Bilirubin 0.7 0.3 - 1.2 mg/dL   GFR calc non Af Amer >60 >60 mL/min   GFR calc Af Amer >60 >60 mL/min   Anion gap 4 (L) 5 - 15  Lipase, blood  Result Value Ref Range   Lipase 25 22 - 51 U/L  Troponin I  Result Value Ref Range   Troponin I <0.03 <0.031 ng/mL  Urinalysis, Routine w reflex microscopic  Result Value Ref Range   Color, Urine YELLOW YELLOW   APPearance CLEAR CLEAR   Specific Gravity, Urine 1.005 1.005 - 1.030   pH 6.5 5.0 - 8.0   Glucose, UA NEGATIVE NEGATIVE mg/dL   Hgb urine dipstick MODERATE (A) NEGATIVE   Bilirubin Urine NEGATIVE NEGATIVE   Ketones, ur NEGATIVE NEGATIVE mg/dL   Protein, ur NEGATIVE NEGATIVE mg/dL   Urobilinogen, UA 0.2 0.0 - 1.0 mg/dL   Nitrite NEGATIVE NEGATIVE   Leukocytes, UA NEGATIVE NEGATIVE  Protime-INR  Result Value Ref Range   Prothrombin Time 22.9 (H) 11.6 - 15.2 seconds   INR 2.00 (H) 0.00 - 1.49  APTT  Result Value Ref Range   aPTT 37 24 - 37 seconds  Brain natriuretic peptide  Result Value Ref Range   B Natriuretic Peptide 367.2 (H) 0.0 - 100.0 pg/mL  Urine microscopic-add on  Result Value Ref Range  WBC, UA 0-2 <3 WBC/hpf   RBC / HPF 7-10 <3 RBC/hpf   Urine-Other MUCOUS PRESENT   I-Stat CG4 Lactic Acid, ED  Result Value Ref Range   Lactic Acid, Venous 0.56 0.5 - 2.0 mmol/L    Labs Review Labs Reviewed  CBC WITH DIFFERENTIAL/PLATELET - Abnormal; Notable for the following:    WBC 11.0 (*)    Hemoglobin 11.0 (*)    HCT 34.1 (*)    MCH 25.9 (*)    RDW 17.6 (*)    Neutro Abs 8.0 (*)    Monocytes Relative 14 (*)    Monocytes Absolute 1.5 (*)    All other components within normal limits  COMPREHENSIVE METABOLIC PANEL - Abnormal; Notable for the following:    Sodium 124 (*)    Chloride 95 (*)    Glucose, Bld 110 (*)    Calcium 8.5 (*)    Albumin 3.3 (*)    Anion gap 4 (*)    All other components within normal limits  URINALYSIS, ROUTINE W REFLEX MICROSCOPIC - Abnormal; Notable for the following:    Hgb  urine dipstick MODERATE (*)    All other components within normal limits  PROTIME-INR - Abnormal; Notable for the following:    Prothrombin Time 22.9 (*)    INR 2.00 (*)    All other components within normal limits  BRAIN NATRIURETIC PEPTIDE - Abnormal; Notable for the following:    B Natriuretic Peptide 367.2 (*)    All other components within normal limits  LIPASE, BLOOD  TROPONIN I  APTT  URINE MICROSCOPIC-ADD ON  I-STAT CG4 LACTIC ACID, ED    Imaging Review Dg Chest Port 1 View  08/27/2014   CLINICAL DATA:  Abdominal pain, nausea.  EXAM: PORTABLE CHEST - 1 VIEW  COMPARISON:  July 15, 2014  FINDINGS: The heart size and mediastinal contours are within normal limits. Both lungs are clear. No pneumothorax or pleural effusion is noted. Sternal sutures are noted. The visualized skeletal structures are unremarkable.  IMPRESSION: No acute cardiopulmonary abnormality seen.   Electronically Signed   By: Marijo Conception, M.D.   On: 08/27/2014 19:09   Dg Abd Portable 1v  08/27/2014   CLINICAL DATA:  Acute onset of upper abdominal pain and nausea. Initial encounter.  EXAM: PORTABLE ABDOMEN - 1 VIEW  COMPARISON:  CT of the abdomen and pelvis performed 06/24/2014  FINDINGS: The visualized bowel gas pattern is unremarkable. Scattered air and stool filled loops of colon are seen; no abnormal dilatation of small bowel loops is seen to suggest small bowel obstruction. No free intra-abdominal air is identified, though evaluation for free air is limited on a single supine view. Postoperative change is noted at the right mid abdomen.  The visualized osseous structures are within normal limits; the sacroiliac joints are unremarkable in appearance. Minimal left basilar scarring is noted. The patient is status post median sternotomy. An aortic valve replacement is noted.  IMPRESSION: Unremarkable bowel gas pattern; no free intra-abdominal air seen. Small to moderate amount of stool noted in the colon.    Electronically Signed   By: Garald Balding M.D.   On: 08/27/2014 22:03     EKG Interpretation   Date/Time:  Wednesday Aug 27 2014 18:04:17 EDT Ventricular Rate:  89 PR Interval:    QRS Duration: 98 QT Interval:  405 QTC Calculation: 493 R Axis:   -79 Text Interpretation:  Atrial fibrillation Left axis deviation Confirmed by  Hazle Coca [phone]) on 08/27/2014 8:00:25  PM       9:52 PM This provider spoke with Dr. Alcario Drought, [facility]. Discussed case, labs, imaging, ED course great detail.  MDM   Final diagnoses:  Hyponatremia  Dehydration  Nausea    Medications  LORazepam (ATIVAN) injection 0.5 mg (not administered)  sodium chloride 0.9 % bolus 500 mL (0 mLs Intravenous Stopped 08/27/14 2135)  LORazepam (ATIVAN) injection 0.5 mg (0.5 mg Intravenous Given 08/27/14 2053)    Filed Vitals:   08/27/14 1757 08/27/14 1950 08/27/14 2202  BP: 160/84 162/81 166/82  Pulse: 89 77 75  Temp: 98.1 F (36.7 C)    TempSrc: Oral    Resp:  16 16  SpO2: 97% 92% 95%   This provider reviewed the patient's chart. Patient had a CT abdomen and pelvis with contrast on 06/24/2014 with negative findings of acute abnormalities. Patient had a gastric emptying procedure performed in 07/22/2014 with unremarkable findings. Upper endoscopy performed on 08/18/2014 the noted deep red mucosa and gastric body and antrum, biopsies were obtained during this time. EKG noted atrial fibrillation with heart rate of 89 bpm. Negative elevated troponin. BNP mildly 367.2. INR 2.00, patient is currently on Coumadin 5 mg. CBC noted mildly elevated white blood cell count 11.0. Hemoglobin 11.0, hematocrit 34.1. CMP noted hyponatremia of 124. Chloride of 95. Glucose level of 110 with anion gap of 4.0 mg/L. PT/INR within Lactic acid negative elevation. Lipase negative elevation. Moderate hemoglobin with negative nitrites or leukocytes-negative findings of infection. Chest x-ray no active cardiopulmonary disease. Plain film of  abdomen unremarkable bowel gas pattern, no free intra-abdominal air seen-small to moderate amount of stool noted in the colon. Patient seen and assessed by attending physician, Dr. Rudean Haskell - does not recommended CT imaging of the abdomen at this time.  Patient presenting to the ED with intractable nausea. Patient given IV fluids and Ativan in ED setting with mild relief. Hyponatremia noted. Patient does have history of CHF, last echocardiogram in July 2015 with an ejection fraction of 60-65%. Patient hydrated slowly in the ED setting to prevent fluid overload. Negative findings of UTI, pyelonephritis, pneumonia, or acute abdominal processes - patient had a scan less than 2 months ago and was negative. Patient to be admitted for dehydration and intractable nausea and vomiting, as well as hyponatremia. Patient to be admitted under the care of [facility]. Patient agreed to plan of care. Patient stable for transfer to main floor.   Jamse Mead, PA-C 08/27/14 2219  Quintella Reichert, MD 08/29/14 [phone]

## 2014-08-27 NOTE — ED Notes (Signed)
Pt states the ativan took away her nausea and she feels better.

## 2014-08-27 NOTE — Telephone Encounter (Signed)
Advised the patient of the recommendations. She is certain she is already doing these things. She is still not vomiting but has terrible nausea. She is drinking water only. She ate 2 scrambled eggs this morning. She is considering the ER at [facility] because she feels so weak.

## 2014-08-27 NOTE — Telephone Encounter (Signed)
Nausea could be due to protonix. Try zantac 150mg  bid

## 2014-08-27 NOTE — Telephone Encounter (Signed)
This patient has been dealing with nausea since January, but it has suddenly gotten much worse. See most recent notes. Her Prilosec has been changed to Protonix. She is taking Zofran without relief. She took Zofran this morning and she is still nauseated, unable to eat. Brittany [name] says she has not bothered to get dressed in days because she feels so sick.

## 2014-08-27 NOTE — ED Notes (Signed)
Pt ambulated w/ cane from home w/ no difficulty

## 2014-08-27 NOTE — Telephone Encounter (Signed)
She has had 1 dose of Protonix. She bought the prescription yesterday.

## 2014-08-27 NOTE — Progress Notes (Addendum)
ANTICOAGULATION CONSULT NOTE - Initial Consult  Pharmacy Consult for coumadin Indication: atrial fibrillation  Allergies  Allergen Reactions  . Amlodipine     Other reaction(s): edema  . Amoxicillin Diarrhea and Nausea And Vomiting  . Carafate [Sucralfate] Swelling    Swelling in knees and blocky red marks on them.  . Cephalexin Diarrhea  . Codeine Nausea Only  . Irbesartan Swelling    Facial and hand numbness and swelling. Patient believes reaction was with this medication but isn't 100% sure. She is currently tolerating lisinopril.  Marland Kitchen Morphine And Related Other (See Comments)    HX ADDICTION / WITHDRAWAL  . Neurontin [Gabapentin] Swelling    Legs swell  . Nitrofurantoin Monohyd Macro Diarrhea and Nausea Only  . Prednisone Nausea Only  . Sulfa Antibiotics Rash     Vital Signs: Temp: 98.1 F (36.7 C) (05/18 1757) Temp Source: Oral (05/18 1757) BP: 166/82 mmHg (05/18 2202) Pulse Rate: 75 (05/18 2202)  Labs:  Recent Labs  08/27/14 1907  HGB 11.0*  HCT 34.1*  PLT 220  APTT 37  LABPROT 22.9*  INR 2.00*  CREATININE 0.55  TROPONINI <0.03    Estimated Creatinine Clearance: 45.4 mL/min (by C-G formula based on Cr of 0.55).   Medical History: Past Medical History  Diagnosis Date  . Dyslipidemia     takes Lipitor daily  . Arthritis   . Urine incontinence   . Diverticulosis   . IC (interstitial cystitis)   . S/P aortic valve replacement     2005  . H/O hiatal hernia   . History of TIA (transient ischemic attack)     2013-- RESIDUAL PERIPHERAL VISION RIGHT EYE--  RESOLVED  . History of bacterial endocarditis   . Foot drop     SINCE 1987  . History of gastritis   . Chronic back pain   . Hypertension   . Atrial fibrillation   . GERD (gastroesophageal reflux disease)   . Hearing impaired     Bilateral hearing aids  . History of stomach ulcers     many yrs ago  . Dysrhythmia     Atrial Fibrillation    Medications:  See med  rec  Assessment: Patient's an 81 y/o F on coumadin PTA for hx afib.  She presented to the ED with c/o worsening of nausea.  INR is therapeutic at 2 on admit and patient took today's dose PTA.  Home dose: 5mg  daily except 2.5mg  on Sundays  Goal of Therapy:  INR 2-3    Plan:  - no additional coumadin dose today since patient already had dose - f/u with AM labs   Darrelyn Morro P 08/27/2014,10:14 PM

## 2014-08-27 NOTE — H&P (Signed)
[facility] History and Physical  ERIAL FIKES UXN:[id number] DOB: Sep 07, 1927 DOA: 08/27/2014  Referring physician: EDP PCP: Mayra Neer, MD   Chief Complaint: N/V, abd pain   HPI: Brittany [name] is a 81 y.o. female who has been suffering from abdominal pain since January of this year.  She has been seeing Dr. Deatra Ina for work up which most recently included upper EGD on 5/9.  EGD demonstrated inflamed mucosa, which was confirmed on biopsy.  Patient has been put on PPI, but her N/V has persisted over the past week, been severe, she has been keeping water down but not much else.  Zofran and phenergan have provided no relief.  Ativan given in the ED today has provided her with her first relief in a week.  Review of Systems: Systems reviewed.  As above, otherwise negative  Past Medical History  Diagnosis Date  . Dyslipidemia     takes Lipitor daily  . Arthritis   . Urine incontinence   . Diverticulosis   . IC (interstitial cystitis)   . S/P aortic valve replacement     2005  . H/O hiatal hernia   . History of TIA (transient ischemic attack)     2013-- RESIDUAL PERIPHERAL VISION RIGHT EYE--  RESOLVED  . History of bacterial endocarditis   . Foot drop     SINCE 1987  . History of gastritis   . Chronic back pain   . Hypertension   . Atrial fibrillation   . GERD (gastroesophageal reflux disease)   . Hearing impaired     Bilateral hearing aids  . History of stomach ulcers     many yrs ago  . Dysrhythmia     Atrial Fibrillation   Past Surgical History  Procedure Laterality Date  . Abdominal hysterectomy  1967  . Aortic valve replacement  09-29-2003     [facility] pericardial tissue valve  . Cholecystectomy  1993  . Vein ligation  1969    RIGHT LOWER LEG  . Dilation and curettage of uterus    . Ercp N/A 08/10/2012    Procedure: ENDOSCOPIC RETROGRADE CHOLANGIOPANCREATOGRAPHY (ERCP);  Surgeon: Inda Castle, MD;  Location: [place];  Service:  Gastroenterology;  Laterality: N/A;  . Cataract extraction w/ intraocular lens  implant, bilateral    . Appendectomy  1933  . Cardiac catheterization  07-15-2003 DR HELEN PRESTON    MODERATE TO MODERATELY SEVERE CALCIFIC AORTIC STENOSIS/  NORMAL CORONARY ARTERIES  . Right shoulder arthroscopy w/ debridement rotator cuff and labral tear/ acrominoplasty/ distal clavicle excision/ ca ligament release  10-08-1999  . Cysto/ hydrodistention/ instillation clorpactin  MULTIPLE  last one 2009  . Mini-open right rotator cuff repair  07-12-2001  . Transvaginal tape procedure  07-30-2002  . Lumbar disc surgery  1985 &  2007  . Macroplastique urethral implantation  08-27-2009  . Transthoracic echocardiogram  08-10-2011    MILD LVH/  EF 55-60%/  NORMAL AVR TISSUE/ MILD MR/  MODERATE DILATED LA/  MILD DILATED RA  . Cardiovascular stress test  01-30-2012  DR NASHER    NORMAL NUCLEAR STUDY/  EF 73%/  NORMAL LVF  . Tonsillectomy and adenoidectomy    . Carpal tunnel release Bilateral   . Cysto with hydrodistension N/A 02/05/2013    Procedure: CYSTOSCOPY/HYDRODISTENSION;  Surgeon: Irine Seal, MD;  Location: [facility];  Service: Urology;  Laterality: N/A;  . Left heart catheterization with coronary angiogram N/A 10/18/2013    Procedure: LEFT HEART CATHETERIZATION WITH CORONARY  ANGIOGRAM;  Surgeon: Jettie Booze, MD;  Location: [facility] CATH LAB;  Service: Cardiovascular;  Laterality: N/A;  . Esophagogastroduodenoscopy (egd) with propofol N/A 08/18/2014    Procedure: ESOPHAGOGASTRODUODENOSCOPY (EGD) WITH PROPOFOL;  Surgeon: Inda Castle, MD;  Location: [facility];  Service: Endoscopy;  Laterality: N/A;   Social History:  reports that she quit smoking about 24 years ago. Her smoking use included Cigarettes. She has never used smokeless tobacco. She reports that she does not drink alcohol or use illicit drugs.  Allergies  Allergen Reactions  . Amlodipine     Other reaction(s): edema  .  Amoxicillin Diarrhea and Nausea And Vomiting  . Carafate [Sucralfate] Swelling    Swelling in knees and blocky red marks on them.  . Cephalexin Diarrhea  . Codeine Nausea Only  . Irbesartan Swelling    Facial and hand numbness and swelling. Patient believes reaction was with this medication but isn't 100% sure. She is currently tolerating lisinopril.  Marland Kitchen Morphine And Related Other (See Comments)    HX ADDICTION / WITHDRAWAL  . Neurontin [Gabapentin] Swelling    Legs swell  . Nitrofurantoin Monohyd Macro Diarrhea and Nausea Only  . Prednisone Nausea Only  . Sulfa Antibiotics Rash    Family History  Problem Relation Age of Onset  . Heart disease Father   . Coronary artery disease Brother   . Bipolar disorder Brother     commited suiside at 96yo  . Alcohol abuse Sister     sister #1  . Alcohol abuse Sister     sister #2  . Colon cancer Maternal Aunt   . Stomach cancer Maternal Grandmother   . Esophageal cancer Neg Hx      Prior to Admission medications   Medication Sig Start Date End Date Taking? Authorizing Provider  atenolol (TENORMIN) 25 MG tablet TAKE 1 TABLET (25 MG TOTAL) BY MOUTH DAILY. 08/15/14  Yes Thayer Headings, MD  atorvastatin (LIPITOR) 20 MG tablet Take 20 mg by mouth every morning.    Yes Historical Provider, MD  CARTIA XT 120 MG 24 hr capsule TAKE 1 TABLET (120 MG TOTAL) BY MOUTH DAILY. 08/15/14  Yes Thayer Headings, MD  cholecalciferol (VITAMIN D) 1000 UNITS tablet Take 1,000 Units by mouth every morning.    Yes Historical Provider, MD  clopidogrel (PLAVIX) 75 MG tablet Take 1 tablet (75 mg total) by mouth daily. 01/20/14  Yes Scott T Kathlen Mody, PA-C  diazepam (VALIUM) 5 MG tablet Take 5 mg by mouth every 12 (twelve) hours as needed for muscle spasms.  10/03/13  Yes Historical Provider, MD  furosemide (LASIX) 40 MG tablet Take 1 tablet (40 mg total) by mouth as directed. TAKE 40 MG ON TUES, THUR, SAT, AND SUN Patient taking differently: Take 40 mg by mouth daily as  needed for fluid or edema. TAKE 40 MG ON TUES, THUR, SAT, AND SUN 01/20/14  Yes Liliane Shi, PA-C  HYDROcodone-acetaminophen (NORCO/VICODIN) 5-325 MG per tablet 1 tablet every 6 (six) hours as needed for moderate pain.  10/03/13  Yes Historical Provider, MD  hyoscyamine (ANASPAZ) 0.125 MG TBDP disintergrating tablet Place 0.125 mg under the tongue as needed for bladder spasms.   Yes Historical Provider, MD  lisinopril (PRINIVIL,ZESTRIL) 20 MG tablet Take 1 tablet (20 mg total) by mouth daily. Pt takes 20mg  two tabs daily Patient taking differently: Take 20 mg by mouth daily.  01/20/14  Yes Liliane Shi, PA-C  Omega-3 Fatty Acids (FISH OIL PO) Take 1 g  by mouth every morning.    Yes Historical Provider, MD  ondansetron (ZOFRAN) 8 MG tablet TAKE 1 TABLET (8 MG TOTAL) BY MOUTH EVERY 8 (EIGHT) HOURS AS NEEDED FOR NAUSEA OR VOMITING. 06/30/14  Yes Lori P Hvozdovic, PA-C  oxybutynin (DITROPAN) 5 MG tablet Take 5 mg by mouth 3 (three) times daily.   Yes Historical Provider, MD  pantoprazole (PROTONIX) 40 MG tablet Take 1 tablet (40 mg total) by mouth daily. 08/25/14  Yes Inda Castle, MD  Potassium Chloride ER 20 MEQ TBCR Take 10 mEq by mouth daily. Patient taking differently: Take 20 mEq by mouth daily as needed (Per pt taking as needed with Furosemide.).  01/20/14  Yes Scott Joylene Draft, PA-C  promethazine (PHENERGAN) 25 MG tablet TAKE 1 TABLET (25 MG TOTAL) BY MOUTH EVERY 6 (SIX) HOURS AS NEEDED FOR NAUSEA OR VOMITING. 12/19/13  Yes Inda Castle, MD  warfarin (COUMADIN) 5 MG tablet Take 1.5 tablets (7.5 mg total) by mouth daily at 6 PM. Patient taking differently: Take 5 mg by mouth daily at 6 PM. Patient states that she takes 1(5mg ) tablet by mouth Monday -Saturday and 1/2  tablet (2.5mg ) on Sunday.--in the PM 10/21/13  Yes Brett Canales, PA-C  zolpidem (AMBIEN) 10 MG tablet Take 10 mg by mouth at bedtime as needed for sleep.   Yes Historical Provider, MD   Physical Exam: Filed Vitals:   08/27/14  2202  BP: 166/82  Pulse: 75  Temp:   Resp: 16    BP 166/82 mmHg  Pulse 75  Temp(Src) 98.1 F (36.7 C) (Oral)  Resp 16  SpO2 95%  General Appearance:    Alert, oriented, no distress, appears stated age  Head:    Normocephalic, atraumatic  Eyes:    PERRL, EOMI, sclera non-icteric        Nose:   Nares without drainage or epistaxis. Mucosa, turbinates normal  Throat:   Moist mucous membranes. Oropharynx without erythema or exudate.  Neck:   Supple. No carotid bruits.  No thyromegaly.  No lymphadenopathy.   Back:     No CVA tenderness, no spinal tenderness  Lungs:     Clear to auscultation bilaterally, without wheezes, rhonchi or rales  Chest wall:    No tenderness to palpitation  Heart:    Regular rate and rhythm without murmurs, gallops, rubs  Abdomen:     Soft, non-tender, nondistended, normal bowel sounds, no organomegaly  Genitalia:    deferred  Rectal:    deferred  Extremities:   No clubbing, cyanosis or edema.  Pulses:   2+ and symmetric all extremities  Skin:   Skin color, texture, turgor normal, no rashes or lesions  Lymph nodes:   Cervical, supraclavicular, and axillary nodes normal  Neurologic:   CNII-XII intact. Normal strength, sensation and reflexes      throughout    Labs on Admission:  Basic Metabolic Panel:  Recent Labs Lab 08/27/14 1907  NA 124*  K 4.1  CL 95*  CO2 25  GLUCOSE 110*  BUN 14  CREATININE 0.55  CALCIUM 8.5*   Liver Function Tests:  Recent Labs Lab 08/27/14 1907  AST 27  ALT 19  ALKPHOS 70  BILITOT 0.7  PROT 6.7  ALBUMIN 3.3*    Recent Labs Lab 08/27/14 1907  LIPASE 25   No results for input(s): AMMONIA in the last 168 hours. CBC:  Recent Labs Lab 08/27/14 1907  WBC 11.0*  NEUTROABS 8.0*  HGB 11.0*  HCT 34.1*  MCV 80.4  PLT 220   Cardiac Enzymes:  Recent Labs Lab 08/27/14 1907  TROPONINI <0.03    BNP (last 3 results)  Recent Labs  10/17/13 2331 12/10/13 1307 01/20/14 1213  PROBNP 1426.0* 339.0*  357.0*   CBG: No results for input(s): GLUCAP in the last 168 hours.  Radiological Exams on Admission: Dg Chest Port 1 View  08/27/2014   CLINICAL DATA:  Abdominal pain, nausea.  EXAM: PORTABLE CHEST - 1 VIEW  COMPARISON:  July 15, 2014  FINDINGS: The heart size and mediastinal contours are within normal limits. Both lungs are clear. No pneumothorax or pleural effusion is noted. Sternal sutures are noted. The visualized skeletal structures are unremarkable.  IMPRESSION: No acute cardiopulmonary abnormality seen.   Electronically Signed   By: Marijo Conception, M.D.   On: 08/27/2014 19:09   Dg Abd Portable 1v  08/27/2014   CLINICAL DATA:  Acute onset of upper abdominal pain and nausea. Initial encounter.  EXAM: PORTABLE ABDOMEN - 1 VIEW  COMPARISON:  CT of the abdomen and pelvis performed 06/24/2014  FINDINGS: The visualized bowel gas pattern is unremarkable. Scattered air and stool filled loops of colon are seen; no abnormal dilatation of small bowel loops is seen to suggest small bowel obstruction. No free intra-abdominal air is identified, though evaluation for free air is limited on a single supine view. Postoperative change is noted at the right mid abdomen.  The visualized osseous structures are within normal limits; the sacroiliac joints are unremarkable in appearance. Minimal left basilar scarring is noted. The patient is status post median sternotomy. An aortic valve replacement is noted.  IMPRESSION: Unremarkable bowel gas pattern; no free intra-abdominal air seen. Small to moderate amount of stool noted in the colon.   Electronically Signed   By: Garald Balding M.D.   On: 08/27/2014 22:03    EKG: Independently reviewed.  Assessment/Plan Principal Problem:   Nausea & vomiting Active Problems:   Abdominal pain, epigastric   Chronic gastritis   Atrial fibrillation   Hyponatremia   1. Chronic gastritis causing nausea and vomiting and epigastric pain - 1. Symptom control with ativan PRN  since this is the only med that has worked thus far. 2. Pain control with PRN norco. 3. Clear liquid diet 4. Continue PPI 5. Consider GI consult in AM (sees Deatra Ina), but sounds like her work up has already been quite extensive over past couple of months. 2. A.Fib  1. Continue home meds for rate control 2. Continue coumadin per pharm consult 3. Hyponatremia - due to dehydration secondary to N/V 1. Hydrating with IVF NS 2. Repeat BMP in AM.   Code Status: Full Code  Family Communication: Family at bedside Disposition Plan: Admit to inpatient   Time spent: 70 min  GARDNER, JARED M. [facility] Pager [phone]  If 7AM-7PM, please contact the day team taking care of the patient [email or web address] Password [email or web address] 08/27/2014, 10:16 PM

## 2014-08-28 ENCOUNTER — Telehealth: Payer: Self-pay | Admitting: Cardiovascular Disease

## 2014-08-28 ENCOUNTER — Inpatient Hospital Stay (HOSPITAL_COMMUNITY): Payer: Medicare Other

## 2014-08-28 DIAGNOSIS — I10 Essential (primary) hypertension: Secondary | ICD-10-CM

## 2014-08-28 DIAGNOSIS — I482 Chronic atrial fibrillation: Secondary | ICD-10-CM

## 2014-08-28 DIAGNOSIS — R110 Nausea: Secondary | ICD-10-CM

## 2014-08-28 DIAGNOSIS — R112 Nausea with vomiting, unspecified: Secondary | ICD-10-CM

## 2014-08-28 LAB — CBC
HCT: 35.3 % — ABNORMAL LOW (ref 36.0–46.0)
Hemoglobin: 11.2 g/dL — ABNORMAL LOW (ref 12.0–15.0)
MCH: 25.7 pg — ABNORMAL LOW (ref 26.0–34.0)
MCHC: 31.7 g/dL (ref 30.0–36.0)
MCV: 81 fL (ref 78.0–100.0)
PLATELETS: 216 10*3/uL (ref 150–400)
RBC: 4.36 MIL/uL (ref 3.87–5.11)
RDW: 17.9 % — AB (ref 11.5–15.5)
WBC: 7.8 10*3/uL (ref 4.0–10.5)

## 2014-08-28 LAB — BASIC METABOLIC PANEL
Anion gap: 6 (ref 5–15)
BUN: 8 mg/dL (ref 6–20)
CO2: 27 mmol/L (ref 22–32)
CREATININE: 0.54 mg/dL (ref 0.44–1.00)
Calcium: 8.5 mg/dL — ABNORMAL LOW (ref 8.9–10.3)
Chloride: 100 mmol/L — ABNORMAL LOW (ref 101–111)
GFR calc Af Amer: >60 mL/min (ref >60)
Glucose, Bld: 95 mg/dL (ref 65–99)
POTASSIUM: 4.4 mmol/L (ref 3.5–5.1)
Sodium: 133 mmol/L — ABNORMAL LOW (ref 135–145)

## 2014-08-28 LAB — PROTIME-INR
INR: 1.81 — ABNORMAL HIGH (ref 0.00–1.49)
Prothrombin Time: 21.1 seconds — ABNORMAL HIGH (ref 11.6–15.2)

## 2014-08-28 MED ORDER — METOCLOPRAMIDE HCL 5 MG/ML IJ SOLN
5.0000 mg | Freq: Three times a day (TID) | INTRAMUSCULAR | Status: DC
  Administered 2014-08-28: 5 mg via INTRAVENOUS
  Filled 2014-08-28: qty 2

## 2014-08-28 MED ORDER — SORBITOL 70 % SOLN
960.0000 mL | TOPICAL_OIL | Freq: Once | ORAL | Status: AC
  Administered 2014-08-28: 960 mL via RECTAL
  Filled 2014-08-28: qty 240

## 2014-08-28 MED ORDER — WARFARIN - PHARMACIST DOSING INPATIENT: Freq: Every day | Status: DC

## 2014-08-28 MED ORDER — ONDANSETRON HCL 4 MG/2ML IJ SOLN: 4.0000 mg | Freq: Four times a day (QID) | INTRAMUSCULAR | Status: DC | PRN

## 2014-08-28 MED ORDER — WARFARIN SODIUM 5 MG PO TABS
5.0000 mg | ORAL_TABLET | Freq: Once | ORAL | Status: AC
  Administered 2014-08-28: 5 mg via ORAL
  Filled 2014-08-28: qty 1

## 2014-08-28 NOTE — Progress Notes (Signed)
PATIENT DETAILS Name: Brittany [name] Age: 81 y.o. Sex: female Date of Birth: 12/29/1927 Admit Date: 08/27/2014 Admitting Physician Etta Quill, DO [id number], MD  Subjective: Continues to have nausea and dry heaving. Denies any abdominal pain. No diarrhea.  Assessment/Plan: Principal Problem:  Acute on chronic Nausea & vomiting: History of chronic nausea/vomiting since January of this year. Has had a endoscopy as an outpatient which showed chronic inflammation, CT scan of the abdomen did not show any acute illness to explain worsening nausea and vomiting, gastric emptying scan was negative. Patient was admitted and provided with supportive measures. Had tried Zofran as an outpatient without much success. Will start scheduled Reglan, and keep on clear liquids. It does appear that she has some amount of constipation seen on x-ray, GI has ordered a smog enema to see if this relieves some of her symptoms. Will continue with supportive measures and slowly attempt to advance diet.  Active Problems:   Hyponatremia: Likely secondary to prerenal azotemia: Significantly improved with IV fluid. Currently euvolemic on exam-Will stop all IV fluids. Follow electrolytes      Chronic atrial fibrillation: Rate controlled with atenolol and Cardizem. On Coumadin for anticoagulation-INR subtherapeutic, pharmacy managing Coumadin while inpatient.     History of CAD status post PCI in 2015: Without chest pain or shortness of breath. Continue Plavix, atenolol and statin.     Chronic diastolic heart failure: Clinically compensated. Stop IV fluids. Resume Lasix when able.     Hypertension: Controlled with atenolol, diltiazem, lisinopril.     Dyslipidemia: Continue statins.     History of aortic valve replacement with a bioprosthetic valve:  Disposition: Remain inpatient-home in the next 1-2 days when nausea/vomiting controlled.  Antimicrobial agents  See  below  Anti-infectives    None     DVT Prophylaxis: On Coumadin  Code Status: Full code  Family Communication None at bedside  Procedures: None  CONSULTS:  GI  Time spent 40 minutes-Greater than 50% of this time was spent in counseling, explanation of diagnosis, planning of further management, and coordination of care.  MEDICATIONS: Scheduled Meds: . atenolol  25 mg Oral Daily  . atorvastatin  20 mg Oral q morning - 10a  . clopidogrel  75 mg Oral Daily  . diltiazem  120 mg Oral Daily  . lisinopril  20 mg Oral Daily  . metoCLOPramide (REGLAN) injection  5 mg Intravenous TID AC  . oxybutynin  5 mg Oral TID  . pantoprazole  40 mg Oral Daily  . sorbitol, milk of mag, mineral oil, glycerin (SMOG) enema  960 mL Rectal Once  . warfarin  5 mg Oral ONCE-1800  . Warfarin - Pharmacist Dosing Inpatient   Does not apply q1800   Continuous Infusions: . sodium chloride 100 mL/hr at 08/27/14 2214   PRN Meds:.HYDROcodone-acetaminophen, hyoscyamine, LORazepam, ondansetron (ZOFRAN) IV    PHYSICAL EXAM: Vital signs in last 24 hours: Filed Vitals:   08/27/14 2202 08/27/14 2257 08/27/14 2300 08/28/14 0604  BP: 166/82 179/82  156/77  Pulse: 75 88  89  Temp:  98.4 F (36.9 C)  98.4 F (36.9 C)  TempSrc:  Oral  Oral  Resp: 16 18  16   Height:   5\' 1"  (1.549 m)   Weight:   67.3 kg (148 lb 5.9 oz)   SpO2: 95% 97%  98%    Weight change:  Filed Weights   08/27/14 2300  Weight:  67.3 kg (148 lb 5.9 oz)   Body mass index is 28.05 kg/(m^2).   Gen Exam: Awake and alert with clear speech.  Neck: Supple, No JVD.   Chest: B/L Clear.   CVS: S1 S2 irregular, no murmurs.  Abdomen: soft, BS +, non tender, non distended.  Extremities: no edema, lower extremities warm to touch. Neurologic: Non Focal.   Skin: No Rash.   Wounds: N/A.   Intake/Output from previous day:  Intake/Output Summary (Last 24 hours) at 08/28/14 1203 Last data filed at 08/28/14 0855  Gross per 24 hour   Intake 1016.67 ml  Output      0 ml  Net 1016.67 ml     LAB RESULTS: CBC  Recent Labs Lab 08/27/14 1907 08/28/14 0540  WBC 11.0* 7.8  HGB 11.0* 11.2*  HCT 34.1* 35.3*  PLT 220 216  MCV 80.4 81.0  MCH 25.9* 25.7*  MCHC 32.3 31.7  RDW 17.6* 17.9*  LYMPHSABS 1.3  --   MONOABS 1.5*  --   EOSABS 0.1  --   BASOSABS 0.0  --     Chemistries   Recent Labs Lab 08/27/14 1907 08/28/14 0540  NA 124* 133*  K 4.1 4.4  CL 95* 100*  CO2 25 27  GLUCOSE 110* 95  BUN 14 8  CREATININE 0.55 0.54  CALCIUM 8.5* 8.5*    CBG: No results for input(s): GLUCAP in the last 168 hours.  GFR Estimated Creatinine Clearance: 45.1 mL/min (by C-G formula based on Cr of 0.54).  Coagulation profile  Recent Labs Lab 08/21/14 1220 08/27/14 1907 08/28/14 0540  INR 3.7 2.00* 1.81*    Cardiac Enzymes  Recent Labs Lab 08/27/14 1907  TROPONINI <0.03    Invalid input(s): POCBNP No results for input(s): DDIMER in the last 72 hours. No results for input(s): HGBA1C in the last 72 hours. No results for input(s): CHOL, HDL, LDLCALC, TRIG, CHOLHDL, LDLDIRECT in the last 72 hours. No results for input(s): TSH, T4TOTAL, T3FREE, THYROIDAB in the last 72 hours.  Invalid input(s): FREET3 No results for input(s): VITAMINB12, FOLATE, FERRITIN, TIBC, IRON, RETICCTPCT in the last 72 hours.  Recent Labs  08/27/14 1907  LIPASE 25    Urine Studies No results for input(s): UHGB, CRYS in the last 72 hours.  Invalid input(s): UACOL, UAPR, USPG, UPH, UTP, UGL, UKET, UBIL, UNIT, UROB, ULEU, UEPI, UWBC, URBC, UBAC, CAST, UCOM, BILUA  MICROBIOLOGY: No results found for this or any previous visit (from the past 240 hour(s)).  RADIOLOGY STUDIES/RESULTS: Dg Chest Port 1 View  08/27/2014   CLINICAL DATA:  Abdominal pain, nausea.  EXAM: PORTABLE CHEST - 1 VIEW  COMPARISON:  July 15, 2014  FINDINGS: The heart size and mediastinal contours are within normal limits. Both lungs are clear. No  pneumothorax or pleural effusion is noted. Sternal sutures are noted. The visualized skeletal structures are unremarkable.  IMPRESSION: No acute cardiopulmonary abnormality seen.   Electronically Signed   By: Marijo Conception, M.D.   On: 08/27/2014 19:09   Dg Abd Portable 1v  08/27/2014   CLINICAL DATA:  Acute onset of upper abdominal pain and nausea. Initial encounter.  EXAM: PORTABLE ABDOMEN - 1 VIEW  COMPARISON:  CT of the abdomen and pelvis performed 06/24/2014  FINDINGS: The visualized bowel gas pattern is unremarkable. Scattered air and stool filled loops of colon are seen; no abnormal dilatation of small bowel loops is seen to suggest small bowel obstruction. No free intra-abdominal air is identified, though evaluation for  free air is limited on a single supine view. Postoperative change is noted at the right mid abdomen.  The visualized osseous structures are within normal limits; the sacroiliac joints are unremarkable in appearance. Minimal left basilar scarring is noted. The patient is status post median sternotomy. An aortic valve replacement is noted.  IMPRESSION: Unremarkable bowel gas pattern; no free intra-abdominal air seen. Small to moderate amount of stool noted in the colon.   Electronically Signed   By: Garald Balding M.D.   On: 08/27/2014 22:03    Oren Binet, MD  [facility] Pager:[phone]  If 7PM-7AM, please contact night-coverage [email or web address] Password [email or web address] 08/28/2014, 12:03 PM   LOS: 1 day

## 2014-08-28 NOTE — Telephone Encounter (Signed)
Will forward message to Dr. Acie Fredrickson.

## 2014-08-28 NOTE — Consult Note (Signed)
Referring Provider: [facility] Primary Care Physician:  Mayra Neer, MD Primary Gastroenterologist:  Dr. Deatra Ina  Reason for Consultation:   Nausea, vomiting  HPI: Brittany [name] is a 81 y.o. female known to Dr. Deatra Ina with a past history of hypertension, chronic diastolic heart failure, A. fib, aortic stenosis, status post non-STEMI, GERD, gastritis, ampullary stenosis, and aortic valve replacement. She had an ERCP and sphincterotomy with balloon dilation in May 2014. Since March 2016 she has been complaining of epigastric pain and nausea. She had a CT of the abdomen on March 15 with no acute findings she was noted to have atherosclerosis of the SMA and celiac stenosis but no progress in 2013 to explain her symptoms. She had a lobulated liver suspicious for cirrhosis. She had a gastric emptying scan on April 12 which was normal. EGD main ninth 2016 had reddened areas in the gastric antrum. Pathology had reactive changes with intestinal metaplasia and no H. pylori. She states that Tuesday through Friday of last week she felt absolutely fine but Saturday morning woke up with acute onset of nausea and vomiting. Her nausea was not alleviated or exacerbated with ingestion of food but was constant. She tried Protonix and ranitidine with no relief she tried Zofran with no relief. She states she was unable to eat or drink anything due to extreme nausea so she came to the emergency room yesterday and was admitted last night. Abdominal films were unremarkable aside from a moderate stool burden.  She also states she has had a hacking cough for 5 or 6 days not associated with fever or chills or night sweats. Chest x-ray done in the ER was unremarkable. Liver enzymes are normal. BUN/creatinine were normal. CBC was unremarkable. She notes that Ativan totally relieves the pain for several hours after each dose. Bowel movements for the past week have been hard and dry and only twice per week.   Past Medical  History  Diagnosis Date  . Dyslipidemia     takes Lipitor daily  . Arthritis   . Urine incontinence   . Diverticulosis   . IC (interstitial cystitis)   . S/P aortic valve replacement     2005  . H/O hiatal hernia   . History of TIA (transient ischemic attack)     2013-- RESIDUAL PERIPHERAL VISION RIGHT EYE--  RESOLVED  . History of bacterial endocarditis   . Foot drop     SINCE 1987  . History of gastritis   . Chronic back pain   . Hypertension   . Atrial fibrillation   . GERD (gastroesophageal reflux disease)   . Hearing impaired     Bilateral hearing aids  . History of stomach ulcers     many yrs ago  . Dysrhythmia     Atrial Fibrillation    Past Surgical History  Procedure Laterality Date  . Abdominal hysterectomy  1967  . Aortic valve replacement  09-29-2003     [facility] pericardial tissue valve  . Cholecystectomy  1993  . Vein ligation  1969    RIGHT LOWER LEG  . Dilation and curettage of uterus    . Ercp N/A 08/10/2012    Procedure: ENDOSCOPIC RETROGRADE CHOLANGIOPANCREATOGRAPHY (ERCP);  Surgeon: Inda Castle, MD;  Location: [facility];  Service: Gastroenterology;  Laterality: N/A;  . Cataract extraction w/ intraocular lens  implant, bilateral    . Appendectomy  1933  . Cardiac catheterization  07-15-2003 DR HELEN PRESTON    MODERATE TO MODERATELY SEVERE  CALCIFIC AORTIC STENOSIS/  NORMAL CORONARY ARTERIES  . Right shoulder arthroscopy w/ debridement rotator cuff and labral tear/ acrominoplasty/ distal clavicle excision/ ca ligament release  10-08-1999  . Cysto/ hydrodistention/ instillation clorpactin  MULTIPLE  last one 2009  . Mini-open right rotator cuff repair  07-12-2001  . Transvaginal tape procedure  07-30-2002  . Lumbar disc surgery  1985 &  2007  . Macroplastique urethral implantation  08-27-2009  . Transthoracic echocardiogram  08-10-2011    MILD LVH/  EF 55-60%/  NORMAL AVR TISSUE/ MILD MR/  MODERATE DILATED LA/  MILD DILATED RA  .  Cardiovascular stress test  01-30-2012  DR NASHER    NORMAL NUCLEAR STUDY/  EF 73%/  NORMAL LVF  . Tonsillectomy and adenoidectomy    . Carpal tunnel release Bilateral   . Cysto with hydrodistension N/A 02/05/2013    Procedure: CYSTOSCOPY/HYDRODISTENSION;  Surgeon: Irine Seal, MD;  Location: [facility];  Service: Urology;  Laterality: N/A;  . Left heart catheterization with coronary angiogram N/A 10/18/2013    Procedure: LEFT HEART CATHETERIZATION WITH CORONARY ANGIOGRAM;  Surgeon: Jettie Booze, MD;  Location: [facility] CATH LAB;  Service: Cardiovascular;  Laterality: N/A;  . Esophagogastroduodenoscopy (egd) with propofol N/A 08/18/2014    Procedure: ESOPHAGOGASTRODUODENOSCOPY (EGD) WITH PROPOFOL;  Surgeon: Inda Castle, MD;  Location: [facility];  Service: Endoscopy;  Laterality: N/A;    Prior to Admission medications   Medication Sig Start Date End Date Taking? Authorizing Provider  atenolol (TENORMIN) 25 MG tablet TAKE 1 TABLET (25 MG TOTAL) BY MOUTH DAILY. 08/15/14  Yes Thayer Headings, MD  atorvastatin (LIPITOR) 20 MG tablet Take 20 mg by mouth every morning.    Yes Historical Provider, MD  CARTIA XT 120 MG 24 hr capsule TAKE 1 TABLET (120 MG TOTAL) BY MOUTH DAILY. 08/15/14  Yes Thayer Headings, MD  cholecalciferol (VITAMIN D) 1000 UNITS tablet Take 1,000 Units by mouth every morning.    Yes Historical Provider, MD  clopidogrel (PLAVIX) 75 MG tablet Take 1 tablet (75 mg total) by mouth daily. 01/20/14  Yes Scott T Kathlen Mody, PA-C  diazepam (VALIUM) 5 MG tablet Take 5 mg by mouth every 12 (twelve) hours as needed for muscle spasms.  10/03/13  Yes Historical Provider, MD  furosemide (LASIX) 40 MG tablet Take 1 tablet (40 mg total) by mouth as directed. TAKE 40 MG ON TUES, THUR, SAT, AND SUN Patient taking differently: Take 40 mg by mouth daily as needed for fluid or edema. TAKE 40 MG ON TUES, THUR, SAT, AND SUN 01/20/14  Yes Liliane Shi, PA-C  HYDROcodone-acetaminophen  (NORCO/VICODIN) 5-325 MG per tablet 1 tablet every 6 (six) hours as needed for moderate pain.  10/03/13  Yes Historical Provider, MD  hyoscyamine (ANASPAZ) 0.125 MG TBDP disintergrating tablet Place 0.125 mg under the tongue as needed for bladder spasms.   Yes Historical Provider, MD  lisinopril (PRINIVIL,ZESTRIL) 20 MG tablet Take 1 tablet (20 mg total) by mouth daily. Pt takes 20mg  two tabs daily Patient taking differently: Take 20 mg by mouth daily.  01/20/14  Yes Liliane Shi, PA-C  Omega-3 Fatty Acids (FISH OIL PO) Take 1 g by mouth every morning.    Yes Historical Provider, MD  ondansetron (ZOFRAN) 8 MG tablet TAKE 1 TABLET (8 MG TOTAL) BY MOUTH EVERY 8 (EIGHT) HOURS AS NEEDED FOR NAUSEA OR VOMITING. 06/30/14  Yes Lori P Hvozdovic, PA-C  oxybutynin (DITROPAN) 5 MG tablet Take 5 mg by mouth 3 (three)  times daily.   Yes Historical Provider, MD  pantoprazole (PROTONIX) 40 MG tablet Take 1 tablet (40 mg total) by mouth daily. 08/25/14  Yes Inda Castle, MD  Potassium Chloride ER 20 MEQ TBCR Take 10 mEq by mouth daily. Patient taking differently: Take 20 mEq by mouth daily as needed (Per pt taking as needed with Furosemide.).  01/20/14  Yes Scott Joylene Draft, PA-C  promethazine (PHENERGAN) 25 MG tablet TAKE 1 TABLET (25 MG TOTAL) BY MOUTH EVERY 6 (SIX) HOURS AS NEEDED FOR NAUSEA OR VOMITING. 12/19/13  Yes Inda Castle, MD  warfarin (COUMADIN) 5 MG tablet Take 1.5 tablets (7.5 mg total) by mouth daily at 6 PM. Patient taking differently: Take 5 mg by mouth daily at 6 PM. Patient states that she takes 1(5mg ) tablet by mouth Monday -Saturday and 1/2  tablet (2.5mg ) on Sunday.--in the PM 10/21/13  Yes Brett Canales, PA-C  zolpidem (AMBIEN) 10 MG tablet Take 10 mg by mouth at bedtime as needed for sleep.   Yes Historical Provider, MD    Current Facility-Administered Medications  Medication Dose Route Frequency Provider Last Rate Last Dose  . 0.9 %  sodium chloride infusion   Intravenous Continuous  Etta Quill, DO 100 mL/hr at 08/27/14 2214    . atenolol (TENORMIN) tablet 25 mg  25 mg Oral Daily Etta Quill, DO      . atorvastatin (LIPITOR) tablet 20 mg  20 mg Oral q morning - 10a Etta Quill, DO   20 mg at 08/27/14 2358  . clopidogrel (PLAVIX) tablet 75 mg  75 mg Oral Daily Etta Quill, DO   75 mg at 08/28/14 3300  . diltiazem (CARDIZEM CD) 24 hr capsule 120 mg  120 mg Oral Daily Etta Quill, DO      . HYDROcodone-acetaminophen (NORCO/VICODIN) 5-325 MG per tablet 1 tablet  1 tablet Oral Q6H PRN Etta Quill, DO      . hyoscyamine (ANASPAZ) disintergrating tablet 0.125 mg  0.125 mg Sublingual PRN Etta Quill, DO      . lisinopril (PRINIVIL,ZESTRIL) tablet 20 mg  20 mg Oral Daily Etta Quill, DO      . LORazepam (ATIVAN) injection 0.5 mg  0.5 mg Intravenous Q6H PRN Etta Quill, DO      . oxybutynin (DITROPAN) tablet 5 mg  5 mg Oral TID Etta Quill, DO   5 mg at 08/27/14 2358  . pantoprazole (PROTONIX) EC tablet 40 mg  40 mg Oral Daily Etta Quill, DO      . warfarin (COUMADIN) tablet 5 mg  5 mg Oral ONCE-1800 Donald Prose Runyon, RPH      . Warfarin - Pharmacist Dosing Inpatient   Does not apply q1800 Dara Hoyer, RPH   0  at 08/28/14 1800    Allergies as of 08/27/2014 - Review Complete 08/27/2014  Allergen Reaction Noted  . Amlodipine  08/07/2014  . Amoxicillin Diarrhea and Nausea And Vomiting 01/06/2011  . Carafate [sucralfate] Swelling 08/08/2014  . Cephalexin Diarrhea 01/06/2011  . Codeine Nausea Only 01/06/2011  . Irbesartan Swelling 11/07/2013  . Morphine and related Other (See Comments) 01/06/2011  . Neurontin [gabapentin] Swelling 12/26/2011  . Nitrofurantoin monohyd macro Diarrhea and Nausea Only 01/06/2011  . Prednisone Nausea Only 06/15/2011  . Sulfa antibiotics Rash 01/01/2012    Family History  Problem Relation Age of Onset  . Heart disease Father   . Coronary artery disease Brother   .  Bipolar disorder Brother      commited suiside at 30yo  . Alcohol abuse Sister     sister #1  . Alcohol abuse Sister     sister #2  . Colon cancer Maternal Aunt   . Stomach cancer Maternal Grandmother   . Esophageal cancer Neg Hx     History   Social History  . Marital Status: Widowed    Spouse Name: N/A  . Number of Children: 4  . Years of Education: N/A   Occupational History  . retired    Social History Main Topics  . Smoking status: Former Smoker    Types: Cigarettes    Quit date: 03/06/1990  . Smokeless tobacco: Never Used  . Alcohol Use: No  . Drug Use: No  . Sexual Activity: Not on file   Other Topics Concern  . Not on file   Social History Narrative    Review of Systems: Per history of present illness, otherwise negative  Physical Exam: Vital signs in last 24 hours: Temp:  [98.1 F (36.7 C)-98.4 F (36.9 C)] 98.4 F (36.9 C) (05/19 0604) Pulse Rate:  [75-89] 89 (05/19 0604) Resp:  [16-18] 16 (05/19 0604) BP: (156-179)/(77-84) 156/77 mmHg (05/19 0604) SpO2:  [92 %-98 %] 98 % (05/19 0604) Weight:  [148 lb 5.9 oz (67.3 kg)] 148 lb 5.9 oz (67.3 kg) (05/18 2300)   General:   Alert,  Well-developed, well-nourished, pleasant and cooperative in NAD Head:  Normocephalic and atraumatic. Eyes:  Sclera clear, no icterus.Conjunctiva pink. Ears:  Normal auditory acuity. Nose:  No deformity, discharge,  or lesions. Mouth:  No deformity or lesions.   Neck:  Supple; no masses or thyromegaly. Lungs:  Clear throughout to auscultation.   No wheezes, crackles, or rhonchi.  Heart:  Regular rate and rhythm; no murmurs, clicks, rubs,  or gallops. Abdomen:  Soft,nontender, BS active,nonpalp mass or hsm.   Rectal:  Deferred  Msk:  Symmetrical without gross deformities. . Pulses:  Normal pulses noted. Extremities:  Without clubbing or edema. Neurologic: Alert and  oriented x4;  grossly normal neurologically. Skin: Intact without significant lesions or rashes.. Psych:  Alert and cooperative. Normal  mood and affect.  Intake/Output from previous day: 05/18 0701 - 05/19 0700 In: 776.7 [I.V.:776.7] Out: -  Intake/Output this shift: Total I/O In: 240 [P.O.:240] Out: -   Lab Results:  Recent Labs  08/27/14 1907 08/28/14 0540  WBC 11.0* 7.8  HGB 11.0* 11.2*  HCT 34.1* 35.3*  PLT 220 216   IBC panel on 08/07/2014 showed iron 24, transferrin 334, saturation ratio 5.1. Ferritin 14.2.  BMET  Recent Labs  08/27/14 1907 08/28/14 0540  NA 124* 133*  K 4.1 4.4  CL 95* 100*  CO2 25 27  GLUCOSE 110* 95  BUN 14 8  CREATININE 0.55 0.54  CALCIUM 8.5* 8.5*   LFT  Recent Labs  08/27/14 1907  PROT 6.7  ALBUMIN 3.3*  AST 27  ALT 19  ALKPHOS 70  BILITOT 0.7   PT/INR  Recent Labs  08/27/14 1907 08/28/14 0540  LABPROT 22.9* 21.1*  INR 2.00* 1.81*    Studies/Results: Dg Chest Port 1 View  08/27/2014   CLINICAL DATA:  Abdominal pain, nausea.  EXAM: PORTABLE CHEST - 1 VIEW  COMPARISON:  July 15, 2014  FINDINGS: The heart size and mediastinal contours are within normal limits. Both lungs are clear. No pneumothorax or pleural effusion is noted. Sternal sutures are noted. The visualized skeletal structures are unremarkable.  IMPRESSION: No acute cardiopulmonary abnormality seen.   Electronically Signed   By: Marijo Conception, M.D.   On: 08/27/2014 19:09   Dg Abd Portable 1v  08/27/2014   CLINICAL DATA:  Acute onset of upper abdominal pain and nausea. Initial encounter.  EXAM: PORTABLE ABDOMEN - 1 VIEW  COMPARISON:  CT of the abdomen and pelvis performed 06/24/2014  FINDINGS: The visualized bowel gas pattern is unremarkable. Scattered air and stool filled loops of colon are seen; no abnormal dilatation of small bowel loops is seen to suggest small bowel obstruction. No free intra-abdominal air is identified, though evaluation for free air is limited on a single supine view. Postoperative change is noted at the right mid abdomen.  The visualized osseous structures are within  normal limits; the sacroiliac joints are unremarkable in appearance. Minimal left basilar scarring is noted. The patient is status post median sternotomy. An aortic valve replacement is noted.  IMPRESSION: Unremarkable bowel gas pattern; no free intra-abdominal air seen. Small to moderate amount of stool noted in the colon.   Electronically Signed   By: Garald Balding M.D.   On: 08/27/2014 22:03   Endoscopies: EGD 08/18/14: ENDOSCOPIC IMPRESSION: 1. In the gastric body and antrum there were areas of deeply reddened mucosa. Atrophic appearing mucosa appeared the prepyloric antrum. Biopsies were taken 2. EGD was otherwise normal RECOMMENDATIONS: Await biopsy results  Pathology:Stomach, biopsy, gastritis - STOMACH MUCOSA WITH REACTIVE GASTROPATHIC CHANGES AND INTESTINAL METAPLASIA. - ASSOCIATED SCATTERED CHRONIC INFLAMMATION. - WARTHIN-STARRY STAIN IS NEGATIVE FOR HELICOBACTER PYLORI. - NO DYSPLASIA OR MALIGNANCY IDENTIFIED.  IMPRESSION/PLAN:    81 year old female with multiple medical problems admitted with exacerbation of her chronic nausea and vomiting. Patient is recently had outpatient workup including abdominal and pelvic CT, gastric emptying scan, an EGD. CT and gastric emptying scan described in history of present illness. EGD revealed areas of deeply reddened mucosa in the gastric body and antrum biopsies of which showed reactive gastropathic changes and intestinal metaplasia. Will review with attending asked to need for surveillance EGD in the future. Hepatic function panel and lipase have been normal. Patient has had relief of her symptoms with Ativan and says she is very happy with the effect of this medication. ? if some of her symptoms may be due to  depression.  Will check abd U/S.  Patient also reports several weeks of hard dry stool and constipation. Recent x-ray with moderate stool burden. Patient reports that she has had little relief with suppositories in the past and she is  fearful that Mira lax or other oral meds for constipation may exacerbate her nausea at this point. Will give trial of a smog enema.  Patient was also noted in April to have an iron deficiency anemia and was offered colonoscopy however was adamant that she did not wish to pursue colonoscopy.  Will review with Dr. Henrene Pastor as to further recommendations.   Hvozdovic, Deloris Ping 08/28/2014,  Pager [phone]  GI ATTENDING  History, laboratories, x-rays, extensive prior GI workups reviewed. Patient personally seen and examined. Agree with H&P as outlined above. Patient with chronic nausea WITHOUT VOMITING. Negative extensive GI workup. Suspect functional. She states that she does better with Ativan. Recommend you consider giving her low-dose anxiolytic if that helps and there are no significant side effects. Aside from ultrasound (which we will follow-up on), No further GI workup indicated. Discussed with patient. Can follow-up outpatient with Dr. Deatra Ina if necessary. Thank you. Will sign off.  Docia Chuck. Geri Seminole.,  M.D. [facility] Division of Gastroenterology

## 2014-08-28 NOTE — Telephone Encounter (Signed)
We have not been called in to see her. Did he leave a number

## 2014-08-28 NOTE — Progress Notes (Signed)
ANTICOAGULATION CONSULT NOTE - Follow up  Pharmacy Consult for coumadin Indication: atrial fibrillation  Allergies  Allergen Reactions  . Amlodipine     Other reaction(s): edema  . Amoxicillin Diarrhea and Nausea And Vomiting  . Carafate [Sucralfate] Swelling    Swelling in knees and blocky red marks on them.  . Cephalexin Diarrhea  . Codeine Nausea Only  . Irbesartan Swelling    Facial and hand numbness and swelling. Patient believes reaction was with this medication but isn't 100% sure. She is currently tolerating lisinopril.  Marland Kitchen Morphine And Related Other (See Comments)    HX ADDICTION / WITHDRAWAL  . Neurontin [Gabapentin] Swelling    Legs swell  . Nitrofurantoin Monohyd Macro Diarrhea and Nausea Only  . Prednisone Nausea Only  . Sulfa Antibiotics Rash     Vital Signs: Temp: 98.4 F (36.9 C) (05/19 0604) Temp Source: Oral (05/19 0604) BP: 156/77 mmHg (05/19 0604) Pulse Rate: 89 (05/19 0604)  Labs:  Recent Labs  08/27/14 1907 08/28/14 0540  HGB 11.0* 11.2*  HCT 34.1* 35.3*  PLT 220 216  APTT 37  --   LABPROT 22.9* 21.1*  INR 2.00* 1.81*  CREATININE 0.55 0.54  TROPONINI <0.03  --     Estimated Creatinine Clearance: 45.1 mL/min (by C-G formula based on Cr of 0.54).   Medical History: Past Medical History  Diagnosis Date  . Dyslipidemia     takes Lipitor daily  . Arthritis   . Urine incontinence   . Diverticulosis   . IC (interstitial cystitis)   . S/P aortic valve replacement     2005  . H/O hiatal hernia   . History of TIA (transient ischemic attack)     2013-- RESIDUAL PERIPHERAL VISION RIGHT EYE--  RESOLVED  . History of bacterial endocarditis   . Foot drop     SINCE 1987  . History of gastritis   . Chronic back pain   . Hypertension   . Atrial fibrillation   . GERD (gastroesophageal reflux disease)   . Hearing impaired     Bilateral hearing aids  . History of stomach ulcers     many yrs ago  . Dysrhythmia     Atrial Fibrillation     Medications:  See med rec  Assessment: 81 y/o F on coumadin PTA.  Patient has a history of atrial fibrillation, aortic valve replacement (bioprosthetic, 2005), CVA, cardiac stent in 10/2013.  Also, on Plavix.  She has been admitted worsening for nausea/vomiting and epigastric pain associated with her chronic gastritis.  Pharmacy consulted to manage warfarin.  INR was therapeutic at 2.0 on admission.  Home dose: 5mg  daily except 2.5mg  on Sundays  Today, 08/28/2014: - INR subtherapeutic at 1.81, patient reported taking warfarin dose yesterday evening PTA - CBC stable - Patient ate 75% of breakfast  Goal of Therapy:  INR 2-3  Plan:  Warfarin 5 mg today.  Will be cautious with further doses if patient continues to have episodes of emesis.  Follow up INR in AM.   Brittany [name] 08/28/2014,10:01 AM

## 2014-08-28 NOTE — Telephone Encounter (Signed)
New Message       Pt's son calling stating that pt is at [facility] and wants to speak to Dr. Acie Fredrickson or his nurse in regards to pt's condition.

## 2014-08-29 ENCOUNTER — Encounter (HOSPITAL_COMMUNITY): Payer: Self-pay

## 2014-08-29 LAB — PROTIME-INR
INR: 2.04 — ABNORMAL HIGH (ref 0.00–1.49)
Prothrombin Time: 22.9 seconds — ABNORMAL HIGH (ref 11.6–15.2)

## 2014-08-29 LAB — BASIC METABOLIC PANEL
Anion gap: 7 (ref 5–15)
BUN: 6 mg/dL (ref 6–20)
CALCIUM: 8.3 mg/dL — AB (ref 8.9–10.3)
CO2: 27 mmol/L (ref 22–32)
Chloride: 97 mmol/L — ABNORMAL LOW (ref 101–111)
Creatinine, Ser: 0.48 mg/dL (ref 0.44–1.00)
GFR calc non Af Amer: >60 mL/min (ref >60)
Glucose, Bld: 90 mg/dL (ref 65–99)
Potassium: 4.1 mmol/L (ref 3.5–5.1)
Sodium: 131 mmol/L — ABNORMAL LOW (ref 135–145)

## 2014-08-29 MED ORDER — PROMETHAZINE HCL 25 MG/ML IJ SOLN: 12.5000 mg | Freq: Four times a day (QID) | INTRAMUSCULAR | Status: DC | PRN

## 2014-08-29 MED ORDER — ONDANSETRON HCL 4 MG PO TABS
4.0000 mg | ORAL_TABLET | Freq: Three times a day (TID) | ORAL | Status: DC
  Administered 2014-08-29 – 2014-08-30 (×4): 4 mg via ORAL
  Filled 2014-08-29 (×6): qty 1

## 2014-08-29 MED ORDER — ACETAMINOPHEN 325 MG PO TABS: 650.0000 mg | ORAL_TABLET | Freq: Four times a day (QID) | ORAL | Status: DC | PRN

## 2014-08-29 MED ORDER — WARFARIN SODIUM 5 MG PO TABS
5.0000 mg | ORAL_TABLET | Freq: Once | ORAL | Status: AC
  Administered 2014-08-29: 5 mg via ORAL
  Filled 2014-08-29: qty 1

## 2014-08-29 MED ORDER — FUROSEMIDE 20 MG PO TABS
20.0000 mg | ORAL_TABLET | Freq: Every day | ORAL | Status: DC
  Administered 2014-08-29 – 2014-08-30 (×2): 20 mg via ORAL
  Filled 2014-08-29 (×2): qty 1

## 2014-08-29 MED ORDER — LORAZEPAM 0.5 MG PO TABS
0.5000 mg | ORAL_TABLET | Freq: Four times a day (QID) | ORAL | Status: DC | PRN
Start: 2014-08-29 — End: 2014-08-30
  Administered 2014-08-29 (×2): 0.5 mg via ORAL
  Filled 2014-08-29 (×2): qty 1

## 2014-08-29 MED ORDER — POLYETHYLENE GLYCOL 3350 17 G PO PACK
17.0000 g | PACK | Freq: Every day | ORAL | Status: DC
  Administered 2014-08-29 – 2014-08-30 (×2): 17 g via ORAL
  Filled 2014-08-29: qty 1

## 2014-08-29 MED ORDER — DM-GUAIFENESIN ER 30-600 MG PO TB12
1.0000 | ORAL_TABLET | Freq: Two times a day (BID) | ORAL | Status: DC
  Administered 2014-08-29 – 2014-08-30 (×3): 1 via ORAL
  Filled 2014-08-29 (×4): qty 1

## 2014-08-29 MED ORDER — PROMETHAZINE HCL 25 MG RE SUPP: 12.5000 mg | Freq: Four times a day (QID) | RECTAL | Status: DC | PRN

## 2014-08-29 MED ORDER — PROMETHAZINE HCL 25 MG PO TABS: 12.5000 mg | ORAL_TABLET | Freq: Four times a day (QID) | ORAL | Status: DC | PRN

## 2014-08-29 MED ORDER — ALBUTEROL SULFATE (2.5 MG/3ML) 0.083% IN NEBU
2.5000 mg | INHALATION_SOLUTION | RESPIRATORY_TRACT | Status: DC | PRN
Start: 2014-08-29 — End: 2014-08-30

## 2014-08-29 NOTE — Progress Notes (Signed)
PATIENT DETAILS Name: Brittany [name] Age: 81 y.o. Sex: female Date of Birth: [birth year] Admit Date: 08/27/2014 Admitting Physician Etta Quill, DO NKN:[id number], MD  Subjective: Claims to have nausea/cough this am. No vomiting after breakfast this am  Assessment/Plan: Principal Problem:   Acute on chronic Nausea & vomiting: History of chronic nausea/vomiting since January of this year. Has had a endoscopy as an outpatient which showed chronic inflammation, CT scan of the abdomen did not show any acute illness to explain worsening nausea and vomiting, gastric emptying scan was negative. Patient was admitted and provided with supportive measures. Seen in gastroenterology, thought to have functional nausea/vomiting. Since continues to have nausea him a Will start scheduled Zofran before meals and continue to follow. Continue to advance diet.   Active Problems:   Hyponatremia: Likely secondary to prerenal azotemia: Significantly improved with IV fluid. Currently euvolemic. Follow electrolytes      Chronic atrial fibrillation: Rate controlled with atenolol and Cardizem. On Coumadin for anticoagulation-INR therapeutic, pharmacy managing Coumadin while inpatient.     History of CAD status post PCI in 2015: Without chest pain or shortness of breath. Continue Plavix, atenolol and statin.     Chronic diastolic heart failure: Clinically compensated. Resume low-dose Lasix     Hypertension: Controlled with atenolol, diltiazem, lisinopril.     Dyslipidemia: Continue statins.     History of aortic valve replacement with a bioprosthetic valve:  Disposition: Remain inpatient-home tomorrow if better.  Antimicrobial agents  See below  Anti-infectives    None     DVT Prophylaxis: On Coumadin  Code Status: Full code  Family Communication None at bedside  Procedures: None  CONSULTS:  GI  Time spent 25 minutes-Greater than 50% of this time was spent in  counseling, explanation of diagnosis, planning of further management, and coordination of care.  MEDICATIONS: Scheduled Meds: . atenolol  25 mg Oral Daily  . atorvastatin  20 mg Oral q morning - 10a  . clopidogrel  75 mg Oral Daily  . dextromethorphan-guaiFENesin  1 tablet Oral BID  . diltiazem  120 mg Oral Daily  . lisinopril  20 mg Oral Daily  . ondansetron  4 mg Oral TID AC  . oxybutynin  5 mg Oral TID  . pantoprazole  40 mg Oral Daily  . polyethylene glycol  17 g Oral Daily  . warfarin  5 mg Oral ONCE-1800  . Warfarin - Pharmacist Dosing Inpatient   Does not apply q1800   Continuous Infusions:   PRN Meds:.acetaminophen, albuterol, HYDROcodone-acetaminophen, hyoscyamine, LORazepam, promethazine **OR** promethazine **OR** promethazine    PHYSICAL EXAM: Vital signs in last 24 hours: Filed Vitals:   08/28/14 1542 08/28/14 2121 08/29/14 0543 08/29/14 0935  BP: 151/67 153/71 119/57 130/64  Pulse: 66 73 68 70  Temp: 98.1 F (36.7 C) 98.6 F (37 C) 99.1 F (37.3 C)   TempSrc: Oral Oral Oral   Resp: 20 20 20    Height:      Weight:      SpO2: 96% 97% 95%     Weight change:  Filed Weights   08/27/14 2300  Weight: 67.3 kg (148 lb 5.9 oz)   Body mass index is 28.05 kg/(m^2).   Gen Exam: Awake and alert with clear speech.  Neck: Supple, No JVD.   Chest: B/L Clear.   CVS: S1 S2 irregular, no murmurs.  Abdomen: soft, BS +, non tender, non distended.  Extremities: no edema, lower extremities warm to touch. Neurologic: Non Focal.   Skin: No Rash.   Wounds: N/A.   Intake/Output from previous day:  Intake/Output Summary (Last 24 hours) at 08/29/14 1250 Last data filed at 08/29/14 0755  Gross per 24 hour  Intake 1371.67 ml  Output      0 ml  Net 1371.67 ml     LAB RESULTS: CBC  Recent Labs Lab 08/27/14 1907 08/28/14 0540  WBC 11.0* 7.8  HGB 11.0* 11.2*  HCT 34.1* 35.3*  PLT 220 216  MCV 80.4 81.0  MCH 25.9* 25.7*  MCHC 32.3 31.7  RDW 17.6* 17.9*    LYMPHSABS 1.3  --   MONOABS 1.5*  --   EOSABS 0.1  --   BASOSABS 0.0  --     Chemistries   Recent Labs Lab 08/27/14 1907 08/28/14 0540 08/29/14 0515  NA 124* 133* 131*  K 4.1 4.4 4.1  CL 95* 100* 97*  CO2 25 27 27   GLUCOSE 110* 95 90  BUN 14 8 6   CREATININE 0.55 0.54 0.48  CALCIUM 8.5* 8.5* 8.3*    CBG: No results for input(s): GLUCAP in the last 168 hours.  GFR Estimated Creatinine Clearance: 45.1 mL/min (by C-G formula based on Cr of 0.48).  Coagulation profile  Recent Labs Lab 08/27/14 1907 08/28/14 0540 08/29/14 0515  INR 2.00* 1.81* 2.04*    Cardiac Enzymes  Recent Labs Lab 08/27/14 1907  TROPONINI <0.03    Invalid input(s): POCBNP No results for input(s): DDIMER in the last 72 hours. No results for input(s): HGBA1C in the last 72 hours. No results for input(s): CHOL, HDL, LDLCALC, TRIG, CHOLHDL, LDLDIRECT in the last 72 hours. No results for input(s): TSH, T4TOTAL, T3FREE, THYROIDAB in the last 72 hours.  Invalid input(s): FREET3 No results for input(s): VITAMINB12, FOLATE, FERRITIN, TIBC, IRON, RETICCTPCT in the last 72 hours.  Recent Labs  08/27/14 1907  LIPASE 25    Urine Studies No results for input(s): UHGB, CRYS in the last 72 hours.  Invalid input(s): UACOL, UAPR, USPG, UPH, UTP, UGL, UKET, UBIL, UNIT, UROB, ULEU, UEPI, UWBC, URBC, UBAC, CAST, UCOM, BILUA  MICROBIOLOGY: No results found for this or any previous visit (from the past 240 hour(s)).  RADIOLOGY STUDIES/RESULTS: US Abdomen Complete  08/28/2014   CLINICAL DATA:  Nausea and vomiting. Lobulated liver on CT scan of 06/24/2014.  EXAM: ULTRASOUND ABDOMEN COMPLETE  COMPARISON:  CT scan dated 06/24/2014  FINDINGS: Gallbladder: Removed.  Common bile duct: Diameter: 6.3 mm, normal.  Liver: There are no focal lesions. The surface of the left lobe of the liver is irregular. Echogenicity of the liver parenchyma is normal.  IVC: No abnormality visualized.  Pancreas: Visualized  portion unremarkable.  Spleen: Size and appearance within normal limits.  Right Kidney: Length: 8.7 cm. Slight prominence of the calices, unchanged since the prior CT scan and not significant. Echogenicity within normal limits. No mass or hydronephrosis visualized.  Left Kidney: Length: 9.7 cm. Echogenicity within normal limits. No mass or hydronephrosis visualized.  Abdominal aorta: No aneurysm visualized. 2.3 cm maximal diameter. Atherosclerotic disease of the abdominal aorta.  Other findings: None.  IMPRESSION: Nodular contour of the left lobe of the liver. This is suggestive of but not diagnostic for cirrhosis. Hepatic elastography may be useful for confirmation of cirrhosis.  Otherwise normal.   Electronically Signed   By: Lorriane Shire M.D.   On: 08/28/2014 17:09   Dg Chest Port 1 View  08/27/2014  CLINICAL DATA:  Abdominal pain, nausea.  EXAM: PORTABLE CHEST - 1 VIEW  COMPARISON:  July 15, 2014  FINDINGS: The heart size and mediastinal contours are within normal limits. Both lungs are clear. No pneumothorax or pleural effusion is noted. Sternal sutures are noted. The visualized skeletal structures are unremarkable.  IMPRESSION: No acute cardiopulmonary abnormality seen.   Electronically Signed   By: Marijo Conception, M.D.   On: 08/27/2014 19:09   Dg Abd Portable 1v  08/27/2014   CLINICAL DATA:  Acute onset of upper abdominal pain and nausea. Initial encounter.  EXAM: PORTABLE ABDOMEN - 1 VIEW  COMPARISON:  CT of the abdomen and pelvis performed 06/24/2014  FINDINGS: The visualized bowel gas pattern is unremarkable. Scattered air and stool filled loops of colon are seen; no abnormal dilatation of small bowel loops is seen to suggest small bowel obstruction. No free intra-abdominal air is identified, though evaluation for free air is limited on a single supine view. Postoperative change is noted at the right mid abdomen.  The visualized osseous structures are within normal limits; the sacroiliac joints  are unremarkable in appearance. Minimal left basilar scarring is noted. The patient is status post median sternotomy. An aortic valve replacement is noted.  IMPRESSION: Unremarkable bowel gas pattern; no free intra-abdominal air seen. Small to moderate amount of stool noted in the colon.   Electronically Signed   By: Garald Balding M.D.   On: 08/27/2014 22:03    Oren Binet, MD  [facility] Pager:[phone]  If 7PM-7AM, please contact night-coverage [email or web address] Password [email or web address] 08/29/2014, 12:50 PM   LOS: 2 days

## 2014-08-29 NOTE — Progress Notes (Signed)
ANTICOAGULATION CONSULT NOTE - Follow up  Pharmacy Consult for Coumadin Indication: atrial fibrillation  Allergies  Allergen Reactions  . Amlodipine     Other reaction(s): edema  . Amoxicillin Diarrhea and Nausea And Vomiting  . Carafate [Sucralfate] Swelling    Swelling in knees and blocky red marks on them.  . Cephalexin Diarrhea  . Codeine Nausea Only  . Irbesartan Swelling    Facial and hand numbness and swelling. Patient believes reaction was with this medication but isn't 100% sure. She is currently tolerating lisinopril.  Marland Kitchen Morphine And Related Other (See Comments)    HX ADDICTION / WITHDRAWAL  . Neurontin [Gabapentin] Swelling    Legs swell  . Nitrofurantoin Monohyd Macro Diarrhea and Nausea Only  . Prednisone Nausea Only  . Sulfa Antibiotics Rash     Vital Signs: Temp: 99.1 Brittany [name] (37.3 C) (05/20 0543) Temp Source: Oral (05/20 0543) BP: 130/64 mmHg (05/20 0935) Pulse Rate: 70 (05/20 0935)  Labs:  Recent Labs  08/27/14 1907 08/28/14 0540 08/29/14 0515  HGB 11.0* 11.2*  --   HCT 34.1* 35.3*  --   PLT 220 216  --   APTT 37  --   --   LABPROT 22.9* 21.1* 22.9*  INR 2.00* 1.81* 2.04*  CREATININE 0.55 0.54 0.48  TROPONINI <0.03  --   --     Estimated Creatinine Clearance: Brittany.1 mL/min (by C-G formula based on Cr of 0.48).   Medical History: Past Medical History  Diagnosis Date  . Dyslipidemia     takes Lipitor daily  . Arthritis   . Urine incontinence   . Diverticulosis   . IC (interstitial cystitis)   . S/P aortic valve replacement     2005  . H/O hiatal hernia   . History of TIA (transient ischemic attack)     2013-- RESIDUAL PERIPHERAL VISION RIGHT EYE--  RESOLVED  . History of bacterial endocarditis   . Foot drop     SINCE 1987  . History of gastritis   . Chronic back pain   . Hypertension   . Atrial fibrillation   . GERD (gastroesophageal reflux disease)   . Hearing impaired     Bilateral hearing aids  . History of stomach ulcers    many yrs ago  . Dysrhythmia     Atrial Fibrillation    Medications:  See med rec  Assessment: 81 y/o Brittany [name] on Coumadin PTA.  Patient has a history of atrial fibrillation, aortic valve replacement (bioprosthetic, 2005), CVA, cardiac stent in 10/2013.  Also on Plavix.  She has been admitted worsening for nausea/vomiting and epigastric pain associated with her chronic gastritis.  Pharmacy consulted to manage warfarin.  INR was therapeutic at 2.0 on admission.  Home dose: 5mg  daily except 2.5mg  on Sundays  Today, 08/29/2014: INR therapeutic (see MAR for inpatient warfarin dosing) On cardiac diet, eating 50-75% of trays Per attending MD, anticipating discharge tomorrow   Goal of Therapy:  INR 2-3  Plan:  Coumadin 5 mg today as per patient's usual regimen. INR daily while inpatient.  Clayburn Pert, PharmD, BCPS Pager: [phone] 08/29/2014  10:22 AM

## 2014-08-29 NOTE — Telephone Encounter (Signed)
Spoke with patient's son who called to ask if Dr. Acie Fredrickson knows that his mother is admitted at [facility].  He states the family is concerned about the patient's cardiac history.  I advised him that we have a team assigned to [facility] rounds but that has not been Dr. Elmarie Shiley assignment this week and that he was not aware that she was admitted prior to receiving this message.  I advised son that if they are concerned, they should talk to the doctors rounding on her about a cardiology consult and that I will make Dr. Acie Fredrickson aware; I advised that Dr. Acie Fredrickson is not in the office today and will also not be rounding at [facility].  Son verbalized understanding and agreement.

## 2014-08-30 LAB — PROTIME-INR
INR: 2.33 — ABNORMAL HIGH (ref 0.00–1.49)
Prothrombin Time: 25.3 seconds — ABNORMAL HIGH (ref 11.6–15.2)

## 2014-08-30 MED ORDER — LORAZEPAM 0.5 MG PO TABS: 0.5000 mg | ORAL_TABLET | Freq: Three times a day (TID) | ORAL | Status: DC | PRN

## 2014-08-30 MED ORDER — ONDANSETRON HCL 4 MG PO TABS: 4.0000 mg | ORAL_TABLET | Freq: Three times a day (TID) | ORAL | Status: DC | PRN

## 2014-08-30 MED ORDER — PROMETHAZINE HCL 12.5 MG PO TABS: 12.5000 mg | ORAL_TABLET | Freq: Four times a day (QID) | ORAL | Status: DC | PRN

## 2014-08-30 NOTE — Discharge Summary (Signed)
PATIENT DETAILS Name: Brittany [name] Age: 81 y.o. Sex: female Date of Birth: 04-16-1927 MRN: [id number]. Admitting Physician: Carron Curie MWU:[id number], MD  Admit Date: 08/27/2014 Discharge date: 08/30/2014  Recommendations for Outpatient Follow-up:  1. Please ensure follow-up with [facility] gastroenterology.  2. New medication-as needed Ativan for nausea, vomiting 3. Please check electrolytes in one week.  PRIMARY DISCHARGE DIAGNOSIS:  Principal Problem:   Nausea & vomiting Active Problems:   Abdominal pain, epigastric   Chronic gastritis   Atrial fibrillation   Hyponatremia      PAST MEDICAL HISTORY: Past Medical History  Diagnosis Date  . Dyslipidemia     takes Lipitor daily  . Arthritis   . Urine incontinence   . Diverticulosis   . IC (interstitial cystitis)   . S/P aortic valve replacement     2005  . H/O hiatal hernia   . History of TIA (transient ischemic attack)     2013-- RESIDUAL PERIPHERAL VISION RIGHT EYE--  RESOLVED  . History of bacterial endocarditis   . Foot drop     SINCE 1987  . History of gastritis   . Chronic back pain   . Hypertension   . Atrial fibrillation   . GERD (gastroesophageal reflux disease)   . Hearing impaired     Bilateral hearing aids  . History of stomach ulcers     many yrs ago  . Dysrhythmia     Atrial Fibrillation    DISCHARGE MEDICATIONS: Current Discharge Medication List    START taking these medications   Details  LORazepam (ATIVAN) 0.5 MG tablet Take 1 tablet (0.5 mg total) by mouth every 8 (eight) hours as needed for anxiety or sedation. Qty: 30 tablet, Refills: 0      CONTINUE these medications which have CHANGED   Details  ondansetron (ZOFRAN) 4 MG tablet Take 1 tablet (4 mg total) by mouth 3 (three) times daily with meals as needed for nausea or vomiting. Qty: 30 tablet, Refills: 0    promethazine (PHENERGAN) 12.5 MG tablet Take 1 tablet (12.5 mg total) by mouth every 6 (six) hours as  needed for nausea or vomiting. Qty: 30 tablet, Refills: 0      CONTINUE these medications which have NOT CHANGED   Details  atenolol (TENORMIN) 25 MG tablet TAKE 1 TABLET (25 MG TOTAL) BY MOUTH DAILY. Qty: 30 tablet, Refills: 5    atorvastatin (LIPITOR) 20 MG tablet Take 20 mg by mouth every morning.     CARTIA XT 120 MG 24 hr capsule TAKE 1 TABLET (120 MG TOTAL) BY MOUTH DAILY. Qty: 30 capsule, Refills: 5    cholecalciferol (VITAMIN D) 1000 UNITS tablet Take 1,000 Units by mouth every morning.     clopidogrel (PLAVIX) 75 MG tablet Take 1 tablet (75 mg total) by mouth daily. Qty: 30 tablet, Refills: 11    furosemide (LASIX) 40 MG tablet Take 1 tablet (40 mg total) by mouth as directed. TAKE 40 MG ON TUES, THUR, SAT, AND SUN Qty: 30 tablet, Refills: 11   Associated Diagnoses: Chronic diastolic HF (heart failure)    hyoscyamine (ANASPAZ) 0.125 MG TBDP disintergrating tablet Place 0.125 mg under the tongue as needed for bladder spasms.    lisinopril (PRINIVIL,ZESTRIL) 20 MG tablet Take 1 tablet (20 mg total) by mouth daily. Pt takes 20mg  two tabs daily Qty: 30 tablet, Refills: 11   Associated Diagnoses: Chronic diastolic HF (heart failure)    Omega-3 Fatty Acids (FISH OIL PO) Take 1 g  by mouth every morning.     oxybutynin (DITROPAN) 5 MG tablet Take 5 mg by mouth 3 (three) times daily.    pantoprazole (PROTONIX) 40 MG tablet Take 1 tablet (40 mg total) by mouth daily. Qty: 90 tablet, Refills: 3    Potassium Chloride ER 20 MEQ TBCR Take 10 mEq by mouth daily. Qty: 30 tablet, Refills: 11   Associated Diagnoses: Chronic diastolic HF (heart failure)    warfarin (COUMADIN) 5 MG tablet Take 1.5 tablets (7.5 mg total) by mouth daily at 6 PM. Qty: 30 tablet, Refills: 5    zolpidem (AMBIEN) 10 MG tablet Take 10 mg by mouth at bedtime as needed for sleep.      STOP taking these medications     diazepam (VALIUM) 5 MG tablet      HYDROcodone-acetaminophen (NORCO/VICODIN) 5-325 MG  per tablet         ALLERGIES:   Allergies  Allergen Reactions  . Amlodipine     Other reaction(s): edema  . Amoxicillin Diarrhea and Nausea And Vomiting  . Carafate [Sucralfate] Swelling    Swelling in knees and blocky red marks on them.  . Cephalexin Diarrhea  . Codeine Nausea Only  . Irbesartan Swelling    Facial and hand numbness and swelling. Patient believes reaction was with this medication but isn't 100% sure. She is currently tolerating lisinopril.  Marland Kitchen Morphine And Related Other (See Comments)    HX ADDICTION / WITHDRAWAL  . Neurontin [Gabapentin] Swelling    Legs swell  . Nitrofurantoin Monohyd Macro Diarrhea and Nausea Only  . Prednisone Nausea Only  . Sulfa Antibiotics Rash    BRIEF HPI:  See H&P, Labs, Consult and Test reports for all details in brief, patient is 81 year old Caucasian female who has been having ongoing nausea with vomiting since this past January, admitted for worsening of nausea and vomiting.  CONSULTATIONS:   GI  PERTINENT RADIOLOGIC STUDIES: US Abdomen Complete  08/28/2014   CLINICAL DATA:  Nausea and vomiting. Lobulated liver on CT scan of 06/24/2014.  EXAM: ULTRASOUND ABDOMEN COMPLETE  COMPARISON:  CT scan dated 06/24/2014  FINDINGS: Gallbladder: Removed.  Common bile duct: Diameter: 6.3 mm, normal.  Liver: There are no focal lesions. The surface of the left lobe of the liver is irregular. Echogenicity of the liver parenchyma is normal.  IVC: No abnormality visualized.  Pancreas: Visualized portion unremarkable.  Spleen: Size and appearance within normal limits.  Right Kidney: Length: 8.7 cm. Slight prominence of the calices, unchanged since the prior CT scan and not significant. Echogenicity within normal limits. No mass or hydronephrosis visualized.  Left Kidney: Length: 9.7 cm. Echogenicity within normal limits. No mass or hydronephrosis visualized.  Abdominal aorta: No aneurysm visualized. 2.3 cm maximal diameter. Atherosclerotic disease of the  abdominal aorta.  Other findings: None.  IMPRESSION: Nodular contour of the left lobe of the liver. This is suggestive of but not diagnostic for cirrhosis. Hepatic elastography may be useful for confirmation of cirrhosis.  Otherwise normal.   Electronically Signed   By: Lorriane Shire M.D.   On: 08/28/2014 17:09   Dg Chest Port 1 View  08/27/2014   CLINICAL DATA:  Abdominal pain, nausea.  EXAM: PORTABLE CHEST - 1 VIEW  COMPARISON:  July 15, 2014  FINDINGS: The heart size and mediastinal contours are within normal limits. Both lungs are clear. No pneumothorax or pleural effusion is noted. Sternal sutures are noted. The visualized skeletal structures are unremarkable.  IMPRESSION: No acute cardiopulmonary abnormality seen.  Electronically Signed   By: Marijo Conception, M.D.   On: 08/27/2014 19:09   Dg Abd Portable 1v  08/27/2014   CLINICAL DATA:  Acute onset of upper abdominal pain and nausea. Initial encounter.  EXAM: PORTABLE ABDOMEN - 1 VIEW  COMPARISON:  CT of the abdomen and pelvis performed 06/24/2014  FINDINGS: The visualized bowel gas pattern is unremarkable. Scattered air and stool filled loops of colon are seen; no abnormal dilatation of small bowel loops is seen to suggest small bowel obstruction. No free intra-abdominal air is identified, though evaluation for free air is limited on a single supine view. Postoperative change is noted at the right mid abdomen.  The visualized osseous structures are within normal limits; the sacroiliac joints are unremarkable in appearance. Minimal left basilar scarring is noted. The patient is status post median sternotomy. An aortic valve replacement is noted.  IMPRESSION: Unremarkable bowel gas pattern; no free intra-abdominal air seen. Small to moderate amount of stool noted in the colon.   Electronically Signed   By: Garald Balding M.D.   On: 08/27/2014 22:03     PERTINENT LAB RESULTS: CBC:  Recent Labs  08/27/14 1907 08/28/14 0540  WBC 11.0* 7.8  HGB  11.0* 11.2*  HCT 34.1* 35.3*  PLT 220 216   CMET CMP     Component Value Date/Time   NA 131* 08/29/2014 0515   K 4.1 08/29/2014 0515   CL 97* 08/29/2014 0515   CO2 27 08/29/2014 0515   GLUCOSE 90 08/29/2014 0515   BUN 6 08/29/2014 0515   CREATININE 0.48 08/29/2014 0515   CALCIUM 8.3* 08/29/2014 0515   PROT 6.7 08/27/2014 1907   ALBUMIN 3.3* 08/27/2014 1907   AST 27 08/27/2014 1907   ALT 19 08/27/2014 1907   ALKPHOS 70 08/27/2014 1907   BILITOT 0.7 08/27/2014 1907   GFRNONAA >60 08/29/2014 0515   GFRAA >60 08/29/2014 0515    GFR Estimated Creatinine Clearance: 45.1 mL/min (by C-G formula based on Cr of 0.48).  Recent Labs  08/27/14 1907  LIPASE 25    Recent Labs  08/27/14 1907  TROPONINI <0.03   Invalid input(s): POCBNP No results for input(s): DDIMER in the last 72 hours. No results for input(s): HGBA1C in the last 72 hours. No results for input(s): CHOL, HDL, LDLCALC, TRIG, CHOLHDL, LDLDIRECT in the last 72 hours. No results for input(s): TSH, T4TOTAL, T3FREE, THYROIDAB in the last 72 hours.  Invalid input(s): FREET3 No results for input(s): VITAMINB12, FOLATE, FERRITIN, TIBC, IRON, RETICCTPCT in the last 72 hours. Coags:  Recent Labs  08/29/14 0515 08/30/14 0551  INR 2.04* 2.33*   Microbiology: No results found for this or any previous visit (from the past 240 hour(s)).   BRIEF HOSPITAL COURSE:     Acute on chronic Nausea & vomiting: History of chronic nausea/vomiting since January of this year. Has had a endoscopy as an outpatient which showed chronic inflammation, CT scan of the abdomen did not show any acute illness to explain worsening nausea and vomiting, gastric emptying scan was negative. Patient was admitted and provided with supportive measures. Seen by gastroenterology, thought to have functional nausea/vomiting-as she response to Ativan. Much improved by the time of discharge, no vomiting, continues to have some mild intermittent nausea that  is managed with Ativan, and anti-emetics as needed. As tolerated advancement in diet. Seen this morning-tolerated breakfast, does not feel nauseous. Stable for discharge. She will need continued follow-up with primary care and primary gastroenterologist.  Active Problems:  Hyponatremia: Likely secondary to prerenal azotemia: Significantly improved . Follow electrolytes closely as outpatient     Chronic atrial fibrillation: Rate controlled with atenolol and Cardizem. On Coumadin for anticoagulation-INR therapeutic, continue Coumadin.   History of CAD status post PCI in 2015: Without chest pain or shortness of breath. Continue Plavix, atenolol and statin.   Chronic diastolic heart failure: Clinically compensated. Resume low-dose Lasix   Hypertension: Controlled with atenolol, diltiazem, lisinopril.   Dyslipidemia: Continue statins.   History of aortic valve replacement with a bioprosthetic valve   TODAY-DAY OF DISCHARGE:  Subjective:   Brittany [name] today has no headache,no chest abdominal pain,no new weakness tingling or numbness, feels much better wants to go home today.   Objective:   Blood pressure 149/64, pulse 66, temperature 98.8 F (37.1 C), temperature source Oral, resp. rate 20, height 5\' 1"  (1.549 m), weight 67.3 kg (148 lb 5.9 oz), SpO2 92 %.  Intake/Output Summary (Last 24 hours) at 08/30/14 1112 Last data filed at 08/30/14 0843  Gross per 24 hour  Intake    480 ml  Output    400 ml  Net     80 ml   Filed Weights   08/27/14 2300  Weight: 67.3 kg (148 lb 5.9 oz)    Exam Awake Alert, Oriented *3, No new F.N deficits, Normal affect [place].AT,PERRAL Supple Neck,No JVD, No cervical lymphadenopathy appriciated.  Symmetrical Chest wall movement, Good air movement bilaterally, CTAB RRR,No Gallops,Rubs or new Murmurs, No Parasternal Heave +ve B.Sounds, Abd Soft, Non tender, No organomegaly appriciated, No rebound -guarding or rigidity. No Cyanosis,  Clubbing or edema, No new Rash or bruise  DISCHARGE CONDITION: Stable  DISPOSITION: Home  DISCHARGE INSTRUCTIONS:    Activity:  As tolerated  Diet recommendation: Heart Healthy diet  Discharge Instructions    Call MD for:  persistant nausea and vomiting    Complete by:  As directed      Diet - low sodium heart healthy    Complete by:  As directed      Increase activity slowly    Complete by:  As directed            Follow-up Information    Follow up with SHAW,KIMBERLEE, MD In 9 days.   Specialty:  Family Medicine   Why:  Jacqlyn Larsen -- [place] office will call the patient to schedule the appointment.   Contact information:   [address] [place] [address] [phone]       Total Time spent on discharge equals 25  minutes.  SignedOren Binet 08/30/2014 11:12 AM

## 2014-08-30 NOTE — Care Management Note (Signed)
Medicare Important Message given? YES  Date Medicare IM given: 08/30/14 Medicare IM given by: Venita Sheffield RN

## 2014-08-30 NOTE — Progress Notes (Signed)
Pt VSS. IV removed yesterday. Reviewed d/c paper work at bedside, no further questions. Paper prescriptions given to pt.  Kizzie Ide, RN

## 2014-09-01 ENCOUNTER — Encounter (HOSPITAL_COMMUNITY): Payer: Self-pay

## 2014-09-03 ENCOUNTER — Encounter (HOSPITAL_COMMUNITY): Payer: Self-pay

## 2014-09-04 ENCOUNTER — Ambulatory Visit (INDEPENDENT_AMBULATORY_CARE_PROVIDER_SITE_OTHER): Payer: Medicare Other

## 2014-09-04 DIAGNOSIS — I482 Chronic atrial fibrillation, unspecified: Secondary | ICD-10-CM

## 2014-09-04 DIAGNOSIS — I480 Paroxysmal atrial fibrillation: Secondary | ICD-10-CM

## 2014-09-04 DIAGNOSIS — I214 Non-ST elevation (NSTEMI) myocardial infarction: Secondary | ICD-10-CM

## 2014-09-04 LAB — POCT INR: INR: 2.1

## 2014-09-05 ENCOUNTER — Telehealth: Payer: Self-pay | Admitting: Gastroenterology

## 2014-09-05 ENCOUNTER — Encounter (HOSPITAL_COMMUNITY): Payer: Self-pay

## 2014-09-05 NOTE — Telephone Encounter (Signed)
She should take ativan q8-12h.  Try clear liquids and advance as tolerated.  May substitute promethazine for zofran.

## 2014-09-05 NOTE — Telephone Encounter (Signed)
Reports she has been fine until 2 days ago when her nausea returned. It has gotten progressively worse. She is able to make herself eat but she is so nauseated she "can barely hold my head up." No vomiting. She is taking Ativan and Zofran daily and has had both of these today. She has promethazine available. She is taking her regular medications. Please advise. Released from hospital 08/28/14.

## 2014-09-05 NOTE — Telephone Encounter (Signed)
Patient is instructed on medications. Details given on the clear liquid diet, also how and when to advance the diet. Instructed on how to get intouch with the on call doctor. She will also call next week with an update. If she acutely worsens, she will go to the ER.

## 2014-09-10 ENCOUNTER — Encounter (HOSPITAL_COMMUNITY): Payer: Self-pay

## 2014-09-10 ENCOUNTER — Telehealth: Payer: Self-pay | Admitting: Gastroenterology

## 2014-09-10 NOTE — Telephone Encounter (Signed)
Patient contacted. She will start Zofran right away. Continue the Ativan. Encourage fluids other than just water. Bland, soft foods. Call tomorrow with progress.

## 2014-09-10 NOTE — Telephone Encounter (Signed)
Try more frequent Zofran

## 2014-09-10 NOTE — Telephone Encounter (Signed)
Doc of the Day - Patient with chronic nausea without vomiting. Normal GES. EGD showed gastritis. Biopsy showed chronic inflammation. She was recently hospitalized for dehydration. Last week after she had returned home, her nausea also returned. She was instructed to take Ativan q 8 to 12 hours. Clear liquids and advance as tolerated. Substitute Phenergan for Zofran and take in between her Ativan doses. She had nausea free days over the weekend. She stopped the Phenergan due to over-sedation (said she was stoned all the time). Today she is very nauseated. Should she resume Phenergan at a lower dose or take Zofran? Thank you for help with this.

## 2014-09-11 ENCOUNTER — Ambulatory Visit: Payer: PRIVATE HEALTH INSURANCE | Admitting: Cardiovascular Disease

## 2014-09-11 ENCOUNTER — Telehealth: Payer: Self-pay | Admitting: Gastroenterology

## 2014-09-11 DIAGNOSIS — R110 Nausea: Secondary | ICD-10-CM | POA: Diagnosis not present

## 2014-09-11 NOTE — Telephone Encounter (Signed)
Noted  

## 2014-09-12 ENCOUNTER — Encounter (HOSPITAL_COMMUNITY): Payer: Self-pay

## 2014-09-15 ENCOUNTER — Encounter (HOSPITAL_COMMUNITY): Payer: Self-pay

## 2014-09-17 ENCOUNTER — Encounter (HOSPITAL_COMMUNITY): Payer: Self-pay

## 2014-09-19 ENCOUNTER — Encounter (HOSPITAL_COMMUNITY): Payer: Self-pay

## 2014-09-22 ENCOUNTER — Encounter (HOSPITAL_COMMUNITY): Payer: Self-pay

## 2014-09-24 ENCOUNTER — Encounter: Payer: Self-pay | Admitting: Physician Assistant

## 2014-09-24 ENCOUNTER — Encounter (HOSPITAL_COMMUNITY): Payer: Self-pay

## 2014-09-24 ENCOUNTER — Ambulatory Visit (INDEPENDENT_AMBULATORY_CARE_PROVIDER_SITE_OTHER): Payer: Medicare Other | Admitting: Physician Assistant

## 2014-09-24 VITALS — BP 170/90 | HR 72 | Ht 61.0 in | Wt 140.4 lb

## 2014-09-24 DIAGNOSIS — I214 Non-ST elevation (NSTEMI) myocardial infarction: Secondary | ICD-10-CM

## 2014-09-24 DIAGNOSIS — R110 Nausea: Secondary | ICD-10-CM | POA: Diagnosis not present

## 2014-09-24 MED ORDER — ONDANSETRON HCL 4 MG PO TABS: ORAL_TABLET | ORAL | Status: DC

## 2014-09-24 MED ORDER — LORAZEPAM 0.5 MG PO TABS: ORAL_TABLET | ORAL | Status: DC

## 2014-09-24 MED ORDER — SCOPOLAMINE 1 MG/3DAYS TD PT72: MEDICATED_PATCH | TRANSDERMAL | Status: DC

## 2014-09-24 NOTE — Progress Notes (Signed)
Patient ID: Brittany [name], female   DOB: 12-03-1927, 81 y.o.   MRN: [id number]   Subjective:    Patient ID: Brittany [name], female    DOB: Dec 27, 1927, 81 y.o.   MRN: [id number]  HPI Brittany [name] is a very nice 81 year old white female known to Dr. Deatra Ina. Her PCP is Dr. Serita Grammes. Brittany [name] is  seen today in post hospital follow-up for persistent chronic nausea 6 months. She has multiple medical problems including history of aortic stenosis status post aortic valve replacement, chronic diastolic heart failure, hypertension, atrial fibrillation, interstitial cystitis, history of TIA, and coronary artery disease status post MI. She is maintained on chronic Plavix and Coumadin. She has had ongoing problems with nausea since January. She says her nausea at this point is fairly constant and almost 24 hours a day. She says she wakes up every morning nauseated and stays nauseated and queasy most of the day. Very occasionally she'll have a good day. She was quite sick a couple of weeks ago and spent several days in bed she was so nauseated. She wasn't able to eat anything during that time and lost 45 pounds. She says overall since January she's probably lost 10-15 pounds. She has no complaints of abdominal pain no current problems with diarrhea, no dysphagia or odynophagia and she says she doesn't actually vomit just stays nauseated. She says she has been forcing herself to eat. This point the nausea is wearing her out. Workup to date has been completely negative for GI etiologies of her nausea, she was seen when she was hospitalized in mid May 2016 and at that time was suggested that she start on an anxiolytic to see if that would help with the nausea. She has been using Ativan when necessary and does find it helpful. She had undergone CT scan of the abdomen and pelvis in March 2016 she was noted to have atherosclerotic changes of the SMA and celiac but no progress since 2013. She also was a lobulated liver  suspicious for cirrhosis. Gastric emptying scan 07/22/2014 normal EGD 9 2016 showed minimal gastritis no H. pylori ERCP with sphincterotomy and balloon dilation had been done remotely in 2014. She also had upper GI done in March 2016 swallowing function was felt to be normal there was evidence of mild GERD and stomach unremarkable. Since discharge from the hospital she had seen her PCP and has had multiple medications held at this point to see if medications are causing her nausea. She is currently off of Lipitor, off Lasix ,off Anaspaz, off lisinopril, odd Ditropan , and Ambien. She is taking Zofran as needed but not on a scheduled basis and using Ativan similarly.  Review of Systems Pertinent positive and negative review of systems were noted in the above HPI section.  All other review of systems was otherwise negative.  Outpatient Encounter Prescriptions as of 09/24/2014  Medication Sig  . atenolol (TENORMIN) 25 MG tablet TAKE 1 TABLET (25 MG TOTAL) BY MOUTH DAILY.  Marland Kitchen CARTIA XT 120 MG 24 hr capsule TAKE 1 TABLET (120 MG TOTAL) BY MOUTH DAILY.  Marland Kitchen clopidogrel (PLAVIX) 75 MG tablet Take 1 tablet (75 mg total) by mouth daily.  . ondansetron (ZOFRAN) 4 MG tablet Take 1 tab twice daily  . pantoprazole (PROTONIX) 40 MG tablet Take 1 tablet (40 mg total) by mouth daily.  . promethazine (PHENERGAN) 12.5 MG tablet Take 1 tablet (12.5 mg total) by mouth every 6 (six) hours as needed for nausea or vomiting.  Marland Kitchen  ranitidine (ZANTAC) 150 MG tablet Take 150 mg by mouth at bedtime.  Marland Kitchen warfarin (COUMADIN) 5 MG tablet Take 1.5 tablets (7.5 mg total) by mouth daily at 6 PM. (Patient taking differently: Take 5 mg by mouth daily at 6 PM. Patient states that she takes 1(5mg ) tablet by mouth Monday -Saturday and 1/2  tablet (2.5mg ) on Sunday.--in the PM)  . zolpidem (AMBIEN) 10 MG tablet Take 10 mg by mouth at bedtime as needed for sleep.  . [DISCONTINUED] ondansetron (ZOFRAN) 4 MG tablet Take 1 tablet (4 mg total) by  mouth 3 (three) times daily with meals as needed for nausea or vomiting.  . diazepam (VALIUM) 5 MG tablet Take 5 mg by mouth as needed for anxiety.  Marland Kitchen HYDROcodone-acetaminophen (NORCO/VICODIN) 5-325 MG per tablet Take 1 tablet by mouth as needed for moderate pain.  Marland Kitchen LORazepam (ATIVAN) 0.5 MG tablet Take 1 tab twice daily.  Marland Kitchen scopolamine (TRANSDERM-SCOP, 1.5 MG,) 1 MG/3DAYS Apply patch to the skin every 3 days.  . [DISCONTINUED] atorvastatin (LIPITOR) 20 MG tablet Take 20 mg by mouth every morning.   . [DISCONTINUED] cholecalciferol (VITAMIN D) 1000 UNITS tablet Take 1,000 Units by mouth every morning.   . [DISCONTINUED] furosemide (LASIX) 40 MG tablet Take 1 tablet (40 mg total) by mouth as directed. TAKE 40 MG ON TUES, THUR, SAT, AND SUN (Patient not taking: Reported on 09/24/2014)  . [DISCONTINUED] hyoscyamine (ANASPAZ) 0.125 MG TBDP disintergrating tablet Place 0.125 mg under the tongue as needed for bladder spasms.  . [DISCONTINUED] lisinopril (PRINIVIL,ZESTRIL) 20 MG tablet Take 1 tablet (20 mg total) by mouth daily. Pt takes 20mg  two tabs daily (Patient not taking: Reported on 09/24/2014)  . [DISCONTINUED] LORazepam (ATIVAN) 0.5 MG tablet Take 1 tablet (0.5 mg total) by mouth every 8 (eight) hours as needed for anxiety or sedation. (Patient not taking: Reported on 09/24/2014)  . [DISCONTINUED] Omega-3 Fatty Acids (FISH OIL PO) Take 1 g by mouth every morning.   . [DISCONTINUED] oxybutynin (DITROPAN) 5 MG tablet Take 5 mg by mouth 3 (three) times daily.  . [DISCONTINUED] Potassium Chloride ER 20 MEQ TBCR Take 10 mEq by mouth daily. (Patient not taking: Reported on 09/24/2014)   No facility-administered encounter medications on file as of 09/24/2014.   Allergies  Allergen Reactions  . Amlodipine     Other reaction(s): edema  . Amoxicillin Diarrhea and Nausea And Vomiting  . Carafate [Sucralfate] Swelling    Swelling in knees and blocky red marks on them.  . Cephalexin Diarrhea  . Codeine  Nausea Only  . Irbesartan Swelling    Facial and hand numbness and swelling. Patient believes reaction was with this medication but isn't 100% sure. She is currently tolerating lisinopril.  Marland Kitchen Morphine And Related Other (See Comments)    HX ADDICTION / WITHDRAWAL  . Neurontin [Gabapentin] Swelling    Legs swell  . Nitrofurantoin Monohyd Macro Diarrhea and Nausea Only  . Prednisone Nausea Only  . Sulfa Antibiotics Rash   Patient Active Problem List   Diagnosis Date Noted  . Hyponatremia 08/27/2014  . Nausea & vomiting 08/27/2014  . Anemia 02/06/2014  . Atrial fibrillation 10/23/2013  . Perioral numbness 10/19/2013  . NSTEMI (non-ST elevated myocardial infarction) 10/18/2013  . Acute myocardial infarction, subendocardial infarction, initial episode of care 10/18/2013  . Headache(784.0) 10/12/2013  . Chronic diastolic heart failure 86/75/4492  . Atelectasis 10/12/2013  . Acute on chronic diastolic congestive heart failure 10/12/2013  . Cephalalgia   . Dizziness 10/11/2013  .  Headache 10/11/2013  . S/P AVR (aortic valve replacement) 11/19/2012  . Abdominal pain, periumbilic 67/61/9509  . 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 Calculus of bile duct without mention of cholecystitis or obstruction 08/10/2012  . Chronic gastritis 07/16/2012  . Nausea alone 05/21/2012  . Abdominal pain, epigastric 05/21/2012  . Cellulitis of foot 01/01/2012  . Dysphagia, unspecified(787.20) 12/26/2011  . Esophageal reflux 12/26/2011  . Dyspnea on exertion 11/18/2011  . Chronic atrial fibrillation 11/07/2011  . Chest pain 06/06/2011  . Aortic stenosis 03/24/2011  . Hypertension 03/24/2011   History   Social History  . Marital Status: Widowed    Spouse Name: N/A  . Number of Children: 4  . Years of Education: N/A   Occupational History  . retired    Social History Main Topics  . Smoking status: Former Smoker    Types: Cigarettes    Quit date: 03/06/1990  . Smokeless tobacco: Never Used  .  Alcohol Use: No  . Drug Use: No  . Sexual Activity: Not on file   Other Topics Concern  . Not on file   Social History Narrative    Ms. Pavon family history includes Alcohol abuse in her sister and sister; Bipolar disorder in her brother; Colon cancer in her maternal aunt; Coronary artery disease in her brother; Heart disease in her father; Stomach cancer in her maternal grandmother. There is no history of Esophageal cancer.      Objective:    Filed Vitals:   09/24/14 1325  BP: 170/90  Pulse: 72    Physical Exam  well-developed elderly white female in no acute distress, pleasant blood pressure 170/90 pulse 72 height 5 foot 1 weight 140. HEENT ;nontraumatic normocephalic EOMI PERRLA sclera anicteric, Supple ;no JVD, Cardiovascular ;regular rate and rhythm with T2-I7 soft systolic murmur, Pulmonary; clear bilaterally, Abdomen; soft nontender nondistended bowel sounds are active there is no palpable mass or hepatosplenomegaly, Rectal ;exam not done, Extremities; no clubbing cyanosis or edema skin warm and dry, Neuropsych; mood and affect appropriate          Assessment & Plan:   #1 81 yo female with 6 month hx of persistent fairly constant nausea.Etiology is unclear and GI workup has been negative.   Thus far no improvement off several of her meds ? 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      Brittany [name] Genia Harold PA-C 09/24/2014   Cc: Mayra Neer, MD

## 2014-09-24 NOTE — Patient Instructions (Addendum)
We sent prescriptions to [place] 2. Zofran ( Ondansetron ) 4mg  3. Scopolomine Transdermal patchs  We will be calling you with an appointment with [facility].

## 2014-09-25 DIAGNOSIS — R110 Nausea: Secondary | ICD-10-CM | POA: Diagnosis not present

## 2014-09-26 ENCOUNTER — Other Ambulatory Visit: Payer: Medicare Other

## 2014-09-26 ENCOUNTER — Ambulatory Visit (INDEPENDENT_AMBULATORY_CARE_PROVIDER_SITE_OTHER): Payer: Medicare Other

## 2014-09-26 ENCOUNTER — Encounter (HOSPITAL_COMMUNITY): Payer: Self-pay

## 2014-09-26 DIAGNOSIS — I214 Non-ST elevation (NSTEMI) myocardial infarction: Secondary | ICD-10-CM | POA: Diagnosis not present

## 2014-09-26 DIAGNOSIS — I480 Paroxysmal atrial fibrillation: Secondary | ICD-10-CM | POA: Diagnosis not present

## 2014-09-26 DIAGNOSIS — I482 Chronic atrial fibrillation, unspecified: Secondary | ICD-10-CM

## 2014-09-26 LAB — PROTIME-INR
INR: 10.2 ratio (ref 0.8–1.0)
Prothrombin Time: 106.2 s (ref 9.6–13.1)

## 2014-09-26 LAB — POCT INR: INR: >8

## 2014-09-28 NOTE — Progress Notes (Signed)
I agree with head CT.  Would d/c protonic and any narcotics

## 2014-09-29 ENCOUNTER — Encounter (HOSPITAL_COMMUNITY): Payer: Self-pay

## 2014-09-30 ENCOUNTER — Ambulatory Visit (INDEPENDENT_AMBULATORY_CARE_PROVIDER_SITE_OTHER): Payer: Medicare Other | Admitting: Neurology

## 2014-09-30 ENCOUNTER — Ambulatory Visit (INDEPENDENT_AMBULATORY_CARE_PROVIDER_SITE_OTHER): Payer: Medicare Other | Admitting: Surgery

## 2014-09-30 ENCOUNTER — Encounter: Payer: Self-pay | Admitting: Neurology

## 2014-09-30 VITALS — BP 140/86 | HR 66 | Resp 18 | Ht 61.0 in | Wt 137.0 lb

## 2014-09-30 DIAGNOSIS — R110 Nausea: Secondary | ICD-10-CM | POA: Diagnosis not present

## 2014-09-30 DIAGNOSIS — I214 Non-ST elevation (NSTEMI) myocardial infarction: Secondary | ICD-10-CM

## 2014-09-30 DIAGNOSIS — I480 Paroxysmal atrial fibrillation: Secondary | ICD-10-CM | POA: Diagnosis not present

## 2014-09-30 DIAGNOSIS — I482 Chronic atrial fibrillation, unspecified: Secondary | ICD-10-CM

## 2014-09-30 LAB — POCT INR: INR: 1.6

## 2014-09-30 NOTE — Progress Notes (Signed)
NEUROLOGY CONSULTATION NOTE  Brittany [name] MRN: [id number] DOB: Dec 04, 1927  Referring provider: Nicoletta Ba, PA-C, [name], MD Primary care provider: Mayra Neer, MD  Reason for consult:  nausea  HISTORY OF PRESENT ILLNESS: Brittany [name] is an 81 year old right-handed woman with atrial fibrillation (on anticoagulation), diastolic heart failure, HTN, CAD status pos NSTEMI and history TIA and of aortic stenosis status post aortic valve replacement who presents for chronic nausea.  Records, labs, reports of CT A/P, EGD and Upper GI series and scans of MRI and MRA of brain from last year reviewed.  She is accompanied by her son who provides some history.  She began experiencing early satiety in January.  There was no associated stomach upset, nausea, or diarrhea or constipation.  About 2 months ago, she began experiencing constant nausea with fluctuation in intensity.  She doesn't vomit but sometimes she feels she will vomit.  She still denies abdominal pain or change in bowel habit.  She still finds it difficult to eat and has lost around 10 lbs over the past 6 months.  She denies headache, dysphagia, dysarthria, focal numbness or weakness or visual disturbance. She has not been encephalopathic or experiencing new frequent falls.  She required hospitalization in May for the nausea as well as for hyponatremia.    She has had an extensive GI workup, which has been overall unremarkable.  CT of abdomen and pelvis showed stable atherosclerotic changes of the SMA and celiac, as well as a lobulated liver.  Gastric emptying was normal.  EGD with stomach biopsy showed minimal gastritis but no malignancy.   Upper GI series showed normal swallowing function with mild GERD.  The next step by GI was to order CT of the head, but it was denied.    She had an MRI and MRA of the head in July 2015 for possible stroke.  It showed old left occipital infarct but nothing acute.  No intracranial stenosis was  seen.  She has no history of cancer.  PAST MEDICAL HISTORY: Past Medical History  Diagnosis Date  . Dyslipidemia     takes Lipitor daily  . Arthritis   . Urine incontinence   . Diverticulosis   . IC (interstitial cystitis)   . S/P aortic valve replacement     2005  . H/O hiatal hernia   . History of TIA (transient ischemic attack)     2013-- RESIDUAL PERIPHERAL VISION RIGHT EYE--  RESOLVED  . History of bacterial endocarditis   . Foot drop     SINCE 1987  . History of gastritis   . Chronic back pain   . Hypertension   . Atrial fibrillation   . GERD (gastroesophageal reflux disease)   . Hearing impaired     Bilateral hearing aids  . History of stomach ulcers     many yrs ago  . Dysrhythmia     Atrial Fibrillation    PAST SURGICAL HISTORY: Past Surgical History  Procedure Laterality Date  . Abdominal hysterectomy  1967  . Aortic valve replacement  09-29-2003     [facility] pericardial tissue valve  . Cholecystectomy  1993  . Vein ligation  1969    RIGHT LOWER LEG  . Dilation and curettage of uterus    . Ercp N/A 08/10/2012    Procedure: ENDOSCOPIC RETROGRADE CHOLANGIOPANCREATOGRAPHY (ERCP);  Surgeon: Inda Castle, MD;  Location: [facility];  Service: Gastroenterology;  Laterality: N/A;  . Cataract extraction w/ intraocular lens  implant, bilateral    . Appendectomy  1933  . Cardiac catheterization  07-15-2003 DR HELEN PRESTON    MODERATE TO MODERATELY SEVERE CALCIFIC AORTIC STENOSIS/  NORMAL CORONARY ARTERIES  . Right shoulder arthroscopy w/ debridement rotator cuff and labral tear/ acrominoplasty/ distal clavicle excision/ ca ligament release  10-08-1999  . Cysto/ hydrodistention/ instillation clorpactin  MULTIPLE  last one 2009  . Mini-open right rotator cuff repair  07-12-2001  . Transvaginal tape procedure  07-30-2002  . Lumbar disc surgery  1985 &  2007  . Macroplastique urethral implantation  08-27-2009  . Transthoracic echocardiogram  08-10-2011     MILD LVH/  EF 55-60%/  NORMAL AVR TISSUE/ MILD MR/  MODERATE DILATED LA/  MILD DILATED RA  . Cardiovascular stress test  01-30-2012  DR NASHER    NORMAL NUCLEAR STUDY/  EF 73%/  NORMAL LVF  . Tonsillectomy and adenoidectomy    . Carpal tunnel release Bilateral   . Cysto with hydrodistension N/A 02/05/2013    Procedure: CYSTOSCOPY/HYDRODISTENSION;  Surgeon: Irine Seal, MD;  Location[facility];  Service: Urology;  Laterality: N/A;  . Left heart catheterization with coronary angiogram N/A 10/18/2013    Procedure: LEFT HEART CATHETERIZATION WITH CORONARY ANGIOGRAM;  Surgeon: Jettie Booze, MD;  Location: [facility] CATH LAB;  Service: Cardiovascular;  Laterality: N/A;  . Esophagogastroduodenoscopy (egd) with propofol N/A 08/18/2014    Procedure: ESOPHAGOGASTRODUODENOSCOPY (EGD) WITH PROPOFOL;  Surgeon: Inda Castle, MD;  Location: [facility] ENDOSCOPY;  Service: Endoscopy;  Laterality: N/A;    MEDICATIONS: Current Outpatient Prescriptions on File Prior to Visit  Medication Sig Dispense Refill  . atenolol (TENORMIN) 25 MG tablet TAKE 1 TABLET (25 MG TOTAL) BY MOUTH DAILY. 30 tablet 5  . CARTIA XT 120 MG 24 hr capsule TAKE 1 TABLET (120 MG TOTAL) BY MOUTH DAILY. 30 capsule 5  . clopidogrel (PLAVIX) 75 MG tablet Take 1 tablet (75 mg total) by mouth daily. 30 tablet 11  . diazepam (VALIUM) 5 MG tablet Take 5 mg by mouth as needed for anxiety.    Marland Kitchen HYDROcodone-acetaminophen (NORCO/VICODIN) 5-325 MG per tablet Take 1 tablet by mouth as needed for moderate pain.    Marland Kitchen LORazepam (ATIVAN) 0.5 MG tablet Take 1 tab twice daily. 60 tablet 3  . ondansetron (ZOFRAN) 4 MG tablet Take 1 tab twice daily 60 tablet 3  . pantoprazole (PROTONIX) 40 MG tablet Take 1 tablet (40 mg total) by mouth daily. 90 tablet 3  . promethazine (PHENERGAN) 12.5 MG tablet Take 1 tablet (12.5 mg total) by mouth every 6 (six) hours as needed for nausea or vomiting. 30 tablet 0  . ranitidine (ZANTAC) 150 MG tablet Take 150 mg by  mouth at bedtime.    Marland Kitchen scopolamine (TRANSDERM-SCOP, 1.5 MG,) 1 MG/3DAYS Apply patch to the skin every 3 days. 10 patch 3  . warfarin (COUMADIN) 5 MG tablet Take 1.5 tablets (7.5 mg total) by mouth daily at 6 PM. (Patient taking differently: Take 5 mg by mouth daily at 6 PM. Patient states that she takes 1(5mg ) tablet by mouth Monday -Saturday and 1/2  tablet (2.5mg ) on Sunday.--in the PM) 30 tablet 5  . zolpidem (AMBIEN) 10 MG tablet Take 10 mg by mouth at bedtime as needed for sleep.     No current facility-administered medications on file prior to visit.    ALLERGIES: Allergies  Allergen Reactions  . Amlodipine     Other reaction(s): edema  . Amoxicillin Diarrhea and Nausea And Vomiting  .  Carafate [Sucralfate] Swelling    Swelling in knees and blocky red marks on them.  . Cephalexin Diarrhea  . Codeine Nausea Only  . Irbesartan Swelling    Facial and hand numbness and swelling. Patient believes reaction was with this medication but isn't 100% sure. She is currently tolerating lisinopril.  Marland Kitchen Morphine And Related Other (See Comments)    HX ADDICTION / WITHDRAWAL  . Neurontin [Gabapentin] Swelling    Legs swell  . Nitrofurantoin Monohyd Macro Diarrhea and Nausea Only  . Prednisone Nausea Only  . Sulfa Antibiotics Rash    FAMILY HISTORY: Family History  Problem Relation Age of Onset  . Heart disease Father   . Coronary artery disease Brother   . Bipolar disorder Brother     commited suiside at 20yo  . Alcohol abuse Sister     sister #1  . Alcohol abuse Sister     sister #2  . Colon cancer Maternal Aunt   . Stomach cancer Maternal Grandmother   . Esophageal cancer Neg Hx     SOCIAL HISTORY: History   Social History  . Marital Status: Widowed    Spouse Name: N/A  . Number of Children: 4  . Years of Education: N/A   Occupational History  . retired    Social History Main Topics  . Smoking status: Former Smoker    Types: Cigarettes    Quit date: 03/06/1990  .  Smokeless tobacco: Never Used  . Alcohol Use: No  . Drug Use: No  . Sexual Activity: No   Other Topics Concern  . Not on file   Social History Narrative    REVIEW OF SYSTEMS: Constitutional: No fevers, chills, or sweats, no generalized fatigue, change in appetite Eyes: No visual changes, double vision, eye pain Ear, nose and throat: No hearing loss, ear pain, nasal congestion, sore throat Cardiovascular: No chest pain, palpitations Respiratory:  No shortness of breath at rest or with exertion, wheezes GastrointestinaI: as above Genitourinary:  No dysuria, urinary retention or frequency Musculoskeletal:  No neck pain, back pain Integumentary: No rash, pruritus, skin lesions Neurological: as above Psychiatric: No depression, insomnia, anxiety Endocrine: No palpitations, fatigue, diaphoresis, mood swings, change in appetite, change in weight, increased thirst Hematologic/Lymphatic:  No anemia, purpura, petechiae. Allergic/Immunologic: no itchy/runny eyes, nasal congestion, recent allergic reactions, rashes  PHYSICAL EXAM: Filed Vitals:   09/30/14 1011  BP: 140/86  Pulse: 66  Resp: 18   General: No acute distress Head:  Normocephalic/atraumatic Eyes:  fundi unremarkable, without vessel changes, exudates, hemorrhages or papilledema. Neck: supple, no paraspinal tenderness, full range of motion Back: No paraspinal tenderness Heart: regular rate and rhythm Lungs: Clear to auscultation bilaterally. Vascular: No carotid bruits. Neurological Exam: Mental status: alert and oriented to person, place, and time, recent and remote memory intact, fund of knowledge intact, attention and concentration intact, speech fluent and not dysarthric, language intact. Cranial nerves: CN I: not tested CN II: pupils equal, round and reactive to light, right homonymous hemianopsia, fundi unremarkable, without vessel changes, exudates, hemorrhages or papilledema. CN III, IV, VI:  full range of motion,  no nystagmus, no ptosis CN V: facial sensation intact CN VII: upper and lower face symmetric CN VIII: hearing intact CN IX, X: gag intact, uvula midline CN XI: sternocleidomastoid and trapezius muscles intact CN XII: tongue midline Bulk & Tone: normal, no fasciculations. Motor:  Left foot drop (chronic), otherwise 5/5. Sensation:  Reduced vibration in the feet.  Reduced pinprick sensation in the left foot  and lower leg. Deep Tendon Reflexes:  Trace throughout except absent in ankles.  Toes downgoing. Finger to nose testing:  Mild intention tremor bilaterally. Heel to shin:  No dysmetria. Gait:  Left flapping gait. Romberg with sway.  IMPRESSION: Nausea.  I have no neurologic explanation for her symptoms.  She exhibits no other neurologic signs or symptoms on exam and history.  She has no history of malignancy.  From a neurologic perspective, I cannot justify ordering imaging of the brain since I have no other reason than for nausea.  I'm not saying that she doesn't need imaging of the head.  It appears that she needs it as other workup for the nausea has been unremarkable.  Order for brain imaging would more likely make sense and be approved by GI, who investigates causes of nausea.  I explained by reasoning to the patient and her son, who comprehend the discussion.  I will discuss this with Dr. Deatra Ina as well.  Thank you for allowing me to take part in the care of this patient.  Metta Clines, DO  CC:  Nicoletta Ba, PA-C  [name], MD  Mayra Neer, MD

## 2014-09-30 NOTE — Patient Instructions (Signed)
I think the imaging of the head should be ordered by GI since they are investigating cause of nausea.  From my perspective, I don't think the insurance company would approve any imaging of the head because your only symptom is nausea.  Your history and exam does not suggest anything else from a neurologic perspective to warrant imaging of the head.  I am not saying that you shouldn't get imaging of the head, I'm just saying that it has more of a chance to be approved by GI.  I will contact Dr. Deatra Ina to discuss with them.

## 2014-10-01 ENCOUNTER — Encounter (HOSPITAL_COMMUNITY): Payer: Self-pay

## 2014-10-01 ENCOUNTER — Other Ambulatory Visit: Payer: Self-pay | Admitting: Family Medicine

## 2014-10-01 DIAGNOSIS — R51 Headache: Secondary | ICD-10-CM | POA: Diagnosis not present

## 2014-10-01 DIAGNOSIS — E871 Hypo-osmolality and hyponatremia: Secondary | ICD-10-CM

## 2014-10-01 DIAGNOSIS — R05 Cough: Secondary | ICD-10-CM

## 2014-10-01 DIAGNOSIS — R634 Abnormal weight loss: Secondary | ICD-10-CM

## 2014-10-01 DIAGNOSIS — R059 Cough, unspecified: Secondary | ICD-10-CM

## 2014-10-01 DIAGNOSIS — R110 Nausea: Secondary | ICD-10-CM | POA: Diagnosis not present

## 2014-10-01 DIAGNOSIS — R519 Headache, unspecified: Secondary | ICD-10-CM

## 2014-10-03 ENCOUNTER — Encounter (HOSPITAL_COMMUNITY): Payer: Self-pay

## 2014-10-06 ENCOUNTER — Encounter (HOSPITAL_COMMUNITY): Payer: Self-pay

## 2014-10-07 ENCOUNTER — Ambulatory Visit
Admission: RE | Admit: 2014-10-07 | Discharge: 2014-10-07 | Disposition: A | Discharge status: Home / Self Care | Payer: Medicare Other | Source: Ambulatory Visit | Attending: Family Medicine | Admitting: Family Medicine

## 2014-10-07 DIAGNOSIS — R51 Headache: Secondary | ICD-10-CM | POA: Diagnosis not present

## 2014-10-07 DIAGNOSIS — R634 Abnormal weight loss: Secondary | ICD-10-CM | POA: Diagnosis not present

## 2014-10-07 DIAGNOSIS — R059 Cough, unspecified: Secondary | ICD-10-CM

## 2014-10-07 DIAGNOSIS — J432 Centrilobular emphysema: Secondary | ICD-10-CM | POA: Diagnosis not present

## 2014-10-07 DIAGNOSIS — R05 Cough: Secondary | ICD-10-CM

## 2014-10-07 DIAGNOSIS — I639 Cerebral infarction, unspecified: Secondary | ICD-10-CM | POA: Diagnosis not present

## 2014-10-07 DIAGNOSIS — R519 Headache, unspecified: Secondary | ICD-10-CM

## 2014-10-07 DIAGNOSIS — S0990XA Unspecified injury of head, initial encounter: Secondary | ICD-10-CM | POA: Diagnosis not present

## 2014-10-07 DIAGNOSIS — E871 Hypo-osmolality and hyponatremia: Secondary | ICD-10-CM

## 2014-10-07 DIAGNOSIS — R918 Other nonspecific abnormal finding of lung field: Secondary | ICD-10-CM | POA: Insufficient documentation

## 2014-10-07 MED ORDER — IOPAMIDOL (ISOVUE-300) INJECTION 61%
75.0000 mL | Freq: Once | INTRAVENOUS | Status: AC | PRN
  Administered 2014-10-07: 75 mL via INTRAVENOUS

## 2014-10-08 ENCOUNTER — Encounter (HOSPITAL_COMMUNITY): Payer: Self-pay

## 2014-10-08 ENCOUNTER — Ambulatory Visit (INDEPENDENT_AMBULATORY_CARE_PROVIDER_SITE_OTHER): Payer: Medicare Other | Admitting: Pharmacist

## 2014-10-08 DIAGNOSIS — I214 Non-ST elevation (NSTEMI) myocardial infarction: Secondary | ICD-10-CM

## 2014-10-08 DIAGNOSIS — I480 Paroxysmal atrial fibrillation: Secondary | ICD-10-CM

## 2014-10-08 DIAGNOSIS — I482 Chronic atrial fibrillation, unspecified: Secondary | ICD-10-CM

## 2014-10-08 LAB — POCT INR: INR: 3.4

## 2014-10-10 ENCOUNTER — Encounter (HOSPITAL_COMMUNITY): Payer: Self-pay

## 2014-10-15 ENCOUNTER — Encounter (HOSPITAL_COMMUNITY): Payer: Self-pay

## 2014-10-16 ENCOUNTER — Encounter (HOSPITAL_COMMUNITY): Payer: Self-pay

## 2014-10-16 ENCOUNTER — Emergency Department (HOSPITAL_COMMUNITY)
Admission: EM | Admit: 2014-10-16 | Discharge: 2014-10-17 | Disposition: A | Discharge status: Home / Self Care | Payer: Medicare Other | Attending: Emergency Medicine | Admitting: Emergency Medicine

## 2014-10-16 ENCOUNTER — Telehealth: Payer: Self-pay | Admitting: Cardiology

## 2014-10-16 ENCOUNTER — Other Ambulatory Visit: Payer: Self-pay | Admitting: Dermatology

## 2014-10-16 DIAGNOSIS — G8929 Other chronic pain: Secondary | ICD-10-CM | POA: Insufficient documentation

## 2014-10-16 DIAGNOSIS — Z880 Allergy status to penicillin: Secondary | ICD-10-CM | POA: Diagnosis not present

## 2014-10-16 DIAGNOSIS — Z8673 Personal history of transient ischemic attack (TIA), and cerebral infarction without residual deficits: Secondary | ICD-10-CM | POA: Diagnosis not present

## 2014-10-16 DIAGNOSIS — M1990 Unspecified osteoarthritis, unspecified site: Secondary | ICD-10-CM | POA: Diagnosis not present

## 2014-10-16 DIAGNOSIS — E785 Hyperlipidemia, unspecified: Secondary | ICD-10-CM | POA: Diagnosis not present

## 2014-10-16 DIAGNOSIS — K219 Gastro-esophageal reflux disease without esophagitis: Secondary | ICD-10-CM | POA: Diagnosis not present

## 2014-10-16 DIAGNOSIS — Z8742 Personal history of other diseases of the female genital tract: Secondary | ICD-10-CM | POA: Diagnosis not present

## 2014-10-16 DIAGNOSIS — Z7902 Long term (current) use of antithrombotics/antiplatelets: Secondary | ICD-10-CM | POA: Insufficient documentation

## 2014-10-16 DIAGNOSIS — Z9889 Other specified postprocedural states: Secondary | ICD-10-CM | POA: Diagnosis not present

## 2014-10-16 DIAGNOSIS — Z7901 Long term (current) use of anticoagulants: Secondary | ICD-10-CM | POA: Diagnosis not present

## 2014-10-16 DIAGNOSIS — I4891 Unspecified atrial fibrillation: Secondary | ICD-10-CM | POA: Insufficient documentation

## 2014-10-16 DIAGNOSIS — Z87891 Personal history of nicotine dependence: Secondary | ICD-10-CM | POA: Insufficient documentation

## 2014-10-16 DIAGNOSIS — I10 Essential (primary) hypertension: Secondary | ICD-10-CM | POA: Insufficient documentation

## 2014-10-16 DIAGNOSIS — Z79899 Other long term (current) drug therapy: Secondary | ICD-10-CM | POA: Diagnosis not present

## 2014-10-16 DIAGNOSIS — H9193 Unspecified hearing loss, bilateral: Secondary | ICD-10-CM | POA: Insufficient documentation

## 2014-10-16 DIAGNOSIS — L570 Actinic keratosis: Secondary | ICD-10-CM | POA: Diagnosis not present

## 2014-10-16 DIAGNOSIS — D0439 Carcinoma in situ of skin of other parts of face: Secondary | ICD-10-CM | POA: Diagnosis not present

## 2014-10-16 DIAGNOSIS — K259 Gastric ulcer, unspecified as acute or chronic, without hemorrhage or perforation: Secondary | ICD-10-CM | POA: Insufficient documentation

## 2014-10-16 DIAGNOSIS — C44329 Squamous cell carcinoma of skin of other parts of face: Secondary | ICD-10-CM | POA: Diagnosis not present

## 2014-10-16 DIAGNOSIS — R040 Epistaxis: Secondary | ICD-10-CM

## 2014-10-16 DIAGNOSIS — Z952 Presence of prosthetic heart valve: Secondary | ICD-10-CM | POA: Insufficient documentation

## 2014-10-16 LAB — CBC
HCT: 38.2 % (ref 36.0–46.0)
Hemoglobin: 12.3 g/dL (ref 12.0–15.0)
MCH: 25.9 pg — AB (ref 26.0–34.0)
MCHC: 32.2 g/dL (ref 30.0–36.0)
MCV: 80.4 fL (ref 78.0–100.0)
PLATELETS: 336 10*3/uL (ref 150–400)
RBC: 4.75 MIL/uL (ref 3.87–5.11)
RDW: 15.7 % — ABNORMAL HIGH (ref 11.5–15.5)
WBC: 10.1 10*3/uL (ref 4.0–10.5)

## 2014-10-16 LAB — PROTIME-INR
INR: 1.85 — AB (ref 0.00–1.49)
Prothrombin Time: 21.3 seconds — ABNORMAL HIGH (ref 11.6–15.2)

## 2014-10-16 NOTE — ED Provider Notes (Signed)
CSN: [id number]     Arrival date & time 10/16/14  2151 History  This chart was scribed for non-physician provider Jeannett Senior, PA-C, working with Ernestina Patches, MD by Irene Pap, ED Scribe. This patient was seen in room TR11C/TR11C and patient care was started at 11:08 PM.   Chief Complaint  Patient presents with  . Epistaxis   The history is provided by the patient. No language interpreter was used.  HPI Comments: Brittany [name] is a 81 y.o. female who presents to the Emergency Department complaining of continuous epistaxis onset 9 hours ago. Pt is on Coumadin. Pt states that her bilateral nostrils were gushing blood and her throat has had clots in it. Reports an instance of blood in her tears. She reports putting pressure on the area and packing it with gauze and Kleenex to no relief. She reports a history of nosebleeds for which she had her nose cauterized; states that this started since she has been on Coumadin. She states that she is seen at [facility], where her nose was cauterized last time. She denies headache, lightheadedness, dizziness, nausea, vomiting, or sore throat.  Past Medical History  Diagnosis Date  . Dyslipidemia     takes Lipitor daily  . Arthritis   . Urine incontinence   . Diverticulosis   . IC (interstitial cystitis)   . S/P aortic valve replacement     2005  . H/O hiatal hernia   . History of TIA (transient ischemic attack)     2013-- RESIDUAL PERIPHERAL VISION RIGHT EYE--  RESOLVED  . History of bacterial endocarditis   . Foot drop     SINCE 1987  . History of gastritis   . Chronic back pain   . Hypertension   . Atrial fibrillation   . GERD (gastroesophageal reflux disease)   . Hearing impaired     Bilateral hearing aids  . History of stomach ulcers     many yrs ago  . Dysrhythmia     Atrial Fibrillation   Past Surgical History  Procedure Laterality Date  . Abdominal hysterectomy  1967  . Aortic valve replacement  09-29-2003      [facility] pericardial tissue valve  . Cholecystectomy  1993  . Vein ligation  1969    RIGHT LOWER LEG  . Dilation and curettage of uterus    . Ercp N/A 08/10/2012    Procedure: ENDOSCOPIC RETROGRADE CHOLANGIOPANCREATOGRAPHY (ERCP);  Surgeon: Inda Castle, MD;  Location: [place];  Service: Gastroenterology;  Laterality: N/A;  . Cataract extraction w/ intraocular lens  implant, bilateral    . Appendectomy  1933  . Cardiac catheterization  07-15-2003 DR HELEN PRESTON    MODERATE TO MODERATELY SEVERE CALCIFIC AORTIC STENOSIS/  NORMAL CORONARY ARTERIES  . Right shoulder arthroscopy w/ debridement rotator cuff and labral tear/ acrominoplasty/ distal clavicle excision/ ca ligament release  10-08-1999  . Cysto/ hydrodistention/ instillation clorpactin  MULTIPLE  last one 2009  . Mini-open right rotator cuff repair  07-12-2001  . Transvaginal tape procedure  07-30-2002  . Lumbar disc surgery  1985 &  2007  . Macroplastique urethral implantation  08-27-2009  . Transthoracic echocardiogram  08-10-2011    MILD LVH/  EF 55-60%/  NORMAL AVR TISSUE/ MILD MR/  MODERATE DILATED LA/  MILD DILATED RA  . Cardiovascular stress test  01-30-2012  DR NASHER    NORMAL NUCLEAR STUDY/  EF 73%/  NORMAL LVF  . Tonsillectomy and adenoidectomy    . Carpal  tunnel release Bilateral   . Cysto with hydrodistension N/A 02/05/2013    Procedure: CYSTOSCOPY/HYDRODISTENSION;  Surgeon: Irine Seal, MD;  Location: [facility];  Service: Urology;  Laterality: N/A;  . Left heart catheterization with coronary angiogram N/A 10/18/2013    Procedure: LEFT HEART CATHETERIZATION WITH CORONARY ANGIOGRAM;  Surgeon: Jettie Booze, MD;  Location: [facility];  Service: Cardiovascular;  Laterality: N/A;  . Esophagogastroduodenoscopy (egd) with propofol N/A 08/18/2014    Procedure: ESOPHAGOGASTRODUODENOSCOPY (EGD) WITH PROPOFOL;  Surgeon: Inda Castle, MD;  Location: [facility];  Service: Endoscopy;   Laterality: N/A;   Family History  Problem Relation Age of Onset  . Heart disease Father   . Coronary artery disease Brother   . Bipolar disorder Brother     commited suiside at 29yo  . Alcohol abuse Sister     sister #1  . Alcohol abuse Sister     sister #2  . Colon cancer Maternal Aunt   . Stomach cancer Maternal Grandmother   . Esophageal cancer Neg Hx    History  Substance Use Topics  . Smoking status: Former Smoker    Types: Cigarettes    Quit date: 03/06/1990  . Smokeless tobacco: Never Used  . Alcohol Use: No   OB History    No data available     Review of Systems  HENT: Positive for nosebleeds. Negative for sore throat.   Gastrointestinal: Negative for nausea and vomiting.  Neurological: Negative for dizziness, light-headedness and headaches.   Allergies  Amlodipine; Amoxicillin; Carafate; Cephalexin; Codeine; Irbesartan; Morphine and related; Neurontin; Nitrofurantoin monohyd macro; Prednisone; and Sulfa antibiotics  Home Medications   Prior to Admission medications   Medication Sig Start Date End Date Taking? Authorizing Provider  atenolol (TENORMIN) 25 MG tablet TAKE 1 TABLET (25 MG TOTAL) BY MOUTH DAILY. 08/15/14   Thayer Headings, MD  atorvastatin (LIPITOR) 20 MG tablet Take 20 mg by mouth daily.    Historical Provider, MD  CARTIA XT 120 MG 24 hr capsule TAKE 1 TABLET (120 MG TOTAL) BY MOUTH DAILY. 08/15/14   Thayer Headings, MD  clopidogrel (PLAVIX) 75 MG tablet Take 1 tablet (75 mg total) by mouth daily. 01/20/14   Liliane Shi, PA-C  diazepam (VALIUM) 5 MG tablet Take 5 mg by mouth as needed for anxiety.    Historical Provider, MD  HYDROcodone-acetaminophen (NORCO/VICODIN) 5-325 MG per tablet Take 1 tablet by mouth as needed for moderate pain.    Historical Provider, MD  lisinopril (PRINIVIL,ZESTRIL) 20 MG tablet Take 20 mg by mouth daily.    Historical Provider, MD  LORazepam (ATIVAN) 0.5 MG tablet Take 1 tab twice daily. 09/24/14   Amy S Esterwood, PA-C   ondansetron (ZOFRAN) 4 MG tablet Take 1 tab twice daily 09/24/14   Amy S Esterwood, PA-C  pantoprazole (PROTONIX) 40 MG tablet Take 1 tablet (40 mg total) by mouth daily. 08/25/14   Inda Castle, MD  promethazine (PHENERGAN) 12.5 MG tablet Take 1 tablet (12.5 mg total) by mouth every 6 (six) hours as needed for nausea or vomiting. 08/30/14   Jonetta Osgood, MD  ranitidine (ZANTAC) 150 MG tablet Take 150 mg by mouth at bedtime.    Historical Provider, MD  scopolamine (TRANSDERM-SCOP, 1.5 MG,) 1 MG/3DAYS Apply patch to the skin every 3 days. 09/24/14   Amy S Esterwood, PA-C  warfarin (COUMADIN) 5 MG tablet Take 1.5 tablets (7.5 mg total) by mouth daily at 6 PM. Patient taking differently:  Take 5 mg by mouth daily at 6 PM. Patient states that she takes 1(5mg ) tablet by mouth Monday -Saturday and 1/2  tablet (2.5mg ) on Sunday.--in the PM 10/21/13   Brett Canales, PA-C  zolpidem (AMBIEN) 10 MG tablet Take 10 mg by mouth at bedtime as needed for sleep.    Historical Provider, MD   BP 167/91 mmHg  Pulse 77  Temp(Src) 97.6 F (36.4 C) (Oral)  Resp 16  Ht 5\' 1"  (1.549 m)  Wt 140 lb (63.504 kg)  BMI 26.47 kg/m2  SpO2 100%  Physical Exam  Constitutional: She is oriented to person, place, and time. She appears well-developed and well-nourished.  HENT:  Head: Normocephalic and atraumatic.  Left anterior nasal bleeding, with clots. Postnasal bleeding.  Eyes: EOM are normal.  Neck: Normal range of motion. Neck supple.  Cardiovascular: Normal rate.   Pulmonary/Chest: Effort normal.  Musculoskeletal: Normal range of motion.  Neurological: She is alert and oriented to person, place, and time.  Skin: Skin is warm and dry.  Psychiatric: She has a normal mood and affect. Her behavior is normal.  Nursing note and vitals reviewed.   ED Course  EPISTAXIS MANAGEMENT Date/Time: 10/17/2014 12:35 AM Performed by: Jeannett Senior Authorized by: Jeannett Senior Consent: Verbal consent  obtained. Risks and benefits: risks, benefits and alternatives were discussed Consent given by: patient Patient understanding: patient states understanding of the procedure being performed Required items: required blood products, implants, devices, and special equipment available Patient identity confirmed: verbally with patient Patient sedated: no Treatment site: left anterior Repair method: silver nitrate Post-procedure assessment: bleeding stopped Treatment complexity: simple   (including critical care time) DIAGNOSTIC STUDIES: Oxygen Saturation is 100% on RA, normal by my interpretation.    COORDINATION OF CARE: 11:15 PM-Discussed treatment plan which includes cauterization of nose with pt at bedside and pt agreed to plan.   Labs Review Labs Reviewed  CBC - Abnormal; Notable for the following:    MCH 25.9 (*)    RDW 15.7 (*)    All other components within normal limits  PROTIME-INR - Abnormal; Notable for the following:    Prothrombin Time 21.3 (*)    INR 1.85 (*)    All other components within normal limits   Imaging Review No results found.   EKG Interpretation None       MDM   Final diagnoses:  Acute anterior epistaxis    Patient with left anterior nasal bleeding. Patient examined. Large clots and homemade packing material is found in left nostril. Patient was able to cough up and blow out most of the clots, packing material was removed with forceps. I then applied Afrin 2 sprays in the left nostril and had patient hold pressure for 10 minutes. Bleeding decreased. I was able to visualize the area of the bleed which is in the inferior nostril. I was able to cauterize bleeding with silver nitrate stick. Patient tolerated the procedure well. She has monitored in emergency performed our, she ambulated twice in the hallway. No bleeding seen. Discussed with Dr. Kathrynn Humble, who has seen patient as well. He agrees patient is stable for discharge at this time. Explained to  the patient that the bleeding may restart in which case she needs to use Afrin and applied pressure. If unable to stop reading at home she needs to return to emergency department. A shunt was also offered packing in emergency department which she refused. Will discharge home with outpatient follow-up.  Filed Vitals:   10/16/14 2159  BP: 167/91  Pulse: 77  Temp: 97.6 F (36.4 C)  TempSrc: Oral  Resp: 16  Height: 5\' 1"  (1.549 m)  Weight: 140 lb (63.504 kg)  SpO2: 100%   I personally performed the services described in this documentation, which was scribed in my presence. The recorded information has been reviewed and is accurate.   Jeannett Senior, PA-C 10/17/14 [place], MD 10/18/14 [phone]

## 2014-10-16 NOTE — ED Notes (Signed)
Pt reports her nose has been bleeding since 2pm today and it hasn't stopped since. She also said she noticed her tears were blood tinged as well. She is on coumadin.

## 2014-10-16 NOTE — Telephone Encounter (Signed)
Paged re: nose bleed since this 2pm afternoon. She is on coumadin and plavix. She has had multiple clots and has gone through a box of tissues without any noticeable improvement in bleeding.  Last INR 3.4 on 6/29. Coumadin dose reduced. She also reports some SOB.   We discussed that she should report to the [facility] ED for evaluation. She will probably need to be cauterized. I suspect she may have aspirated a bit of the blood leading to SOB. She is in agreement and has a ride.

## 2014-10-17 ENCOUNTER — Encounter (HOSPITAL_COMMUNITY): Payer: Self-pay

## 2014-10-17 ENCOUNTER — Telehealth: Payer: Self-pay | Admitting: *Deleted

## 2014-10-17 NOTE — Telephone Encounter (Signed)
Patient called and stated she had a 6 hour nosebleed last night and was treated in the ER.  Her INR in the ER was 1.8 and per the patient they cauterized the bleed and advised her to skip two days of coumadin and to follow up with CVRR.  Spoke with Erasmo Downer, pharmacist, and noting her history she advised to have patient resume coumadin and take 1/2 tablet (2.5 mg) today then resume normal dose.  I called the patient to advise and give detailed instructions.  The patient verbalized understanding over the phone and was advised to return to the ER with any problems and update CVRR as needed.

## 2014-10-17 NOTE — Discharge Instructions (Signed)
Stop Coumadin for the next 2 days. Apply pressure and Afrin spray immediately if bleeding starts again, hold for 5 minutes. Avoid placing anything in her nostril, or blowing your nose the next several days. Follow-up with ear nose throat doctor or return if unable to stop bleeding at home.   Nosebleed Nosebleeds can be caused by many conditions, including trauma, infections, polyps, foreign bodies, dry mucous membranes or climate, medicines, and air conditioning. Most nosebleeds occur in the front of the nose. Because of this location, most nosebleeds can be controlled by pinching the nostrils gently and continuously for at least 10 to 20 minutes. The long, continuous pressure allows enough time for the blood to clot. If pressure is released during that 10 to 20 minute time period, the process may have to be started again. The nosebleed may stop by itself or quit with pressure, or it may need concentrated heating (cautery) or pressure from packing. HOME CARE INSTRUCTIONS   If your nose was packed, try to maintain the pack inside until your health care provider removes it. If a gauze pack was used and it starts to fall out, gently replace it or cut the end off. Do not cut if a balloon catheter was used to pack the nose. Otherwise, do not remove unless instructed.  Avoid blowing your nose for 12 hours after treatment. This could dislodge the pack or clot and start the bleeding again.  If the bleeding starts again, sit up and bend forward, gently pinching the front half of your nose continuously for 20 minutes.  If bleeding was caused by dry mucous membranes, use over-the-counter saline nasal spray or gel. This will keep the mucous membranes moist and allow them to heal. If you must use a lubricant, choose the water-soluble variety. Use it only sparingly and not within several hours of lying down.  Do not use petroleum jelly or mineral oil, as these may drip into the lungs and cause serious  problems.  Maintain humidity in your home by using less air conditioning or by using a humidifier.  Do not use aspirin or medicines which make bleeding more likely. Your health care provider can give you recommendations on this.  Resume normal activities as you are able, but try to avoid straining, lifting, or bending at the waist for several days.  If the nosebleeds become recurrent and the cause is unknown, your health care provider may suggest laboratory tests. SEEK MEDICAL CARE IF: You have a fever. SEEK IMMEDIATE MEDICAL CARE IF:   Bleeding recurs and cannot be controlled.  There is unusual bleeding from or bruising on other parts of the body.  Nosebleeds continue.  There is any worsening of the condition which originally brought you in.  You become light-headed, feel faint, become sweaty, or vomit blood. MAKE SURE YOU:   Understand these instructions.  Will watch your condition.  Will get help right away if you are not doing well or get worse. Document Released: 01/05/2005 Document Revised: 08/12/2013 Document Reviewed: 02/26/2009 [facility] Patient Information 2015 [place], [place]. This information is not intended to replace advice given to you by your health care provider. Make sure you discuss any questions you have with your health care provider.

## 2014-10-20 ENCOUNTER — Encounter (HOSPITAL_COMMUNITY): Payer: Self-pay

## 2014-10-22 ENCOUNTER — Encounter (HOSPITAL_COMMUNITY): Payer: Self-pay

## 2014-10-22 ENCOUNTER — Ambulatory Visit (INDEPENDENT_AMBULATORY_CARE_PROVIDER_SITE_OTHER): Payer: Medicare Other | Admitting: *Deleted

## 2014-10-22 DIAGNOSIS — I482 Chronic atrial fibrillation, unspecified: Secondary | ICD-10-CM

## 2014-10-22 DIAGNOSIS — I480 Paroxysmal atrial fibrillation: Secondary | ICD-10-CM

## 2014-10-22 DIAGNOSIS — I214 Non-ST elevation (NSTEMI) myocardial infarction: Secondary | ICD-10-CM

## 2014-10-22 LAB — POCT INR: INR: 2

## 2014-10-24 ENCOUNTER — Encounter (HOSPITAL_COMMUNITY): Payer: Self-pay

## 2014-10-27 ENCOUNTER — Other Ambulatory Visit: Payer: Self-pay | Admitting: Physician Assistant

## 2014-10-27 ENCOUNTER — Encounter (HOSPITAL_COMMUNITY): Payer: Self-pay

## 2014-10-27 DIAGNOSIS — R110 Nausea: Secondary | ICD-10-CM | POA: Diagnosis not present

## 2014-10-27 DIAGNOSIS — J449 Chronic obstructive pulmonary disease, unspecified: Secondary | ICD-10-CM | POA: Diagnosis not present

## 2014-10-27 DIAGNOSIS — R05 Cough: Secondary | ICD-10-CM | POA: Diagnosis not present

## 2014-10-29 ENCOUNTER — Encounter (HOSPITAL_COMMUNITY): Payer: Self-pay

## 2014-10-31 ENCOUNTER — Emergency Department (HOSPITAL_COMMUNITY)
Admission: EM | Admit: 2014-10-31 | Discharge: 2014-10-31 | Disposition: A | Discharge status: Home / Self Care | Payer: Medicare Other | Attending: Emergency Medicine | Admitting: Emergency Medicine

## 2014-10-31 ENCOUNTER — Encounter (HOSPITAL_COMMUNITY): Payer: Self-pay

## 2014-10-31 ENCOUNTER — Encounter (HOSPITAL_COMMUNITY): Payer: Self-pay | Admitting: Emergency Medicine

## 2014-10-31 ENCOUNTER — Emergency Department (HOSPITAL_COMMUNITY): Payer: Medicare Other

## 2014-10-31 DIAGNOSIS — Z79899 Other long term (current) drug therapy: Secondary | ICD-10-CM | POA: Insufficient documentation

## 2014-10-31 DIAGNOSIS — Z9861 Coronary angioplasty status: Secondary | ICD-10-CM | POA: Insufficient documentation

## 2014-10-31 DIAGNOSIS — H9190 Unspecified hearing loss, unspecified ear: Secondary | ICD-10-CM | POA: Insufficient documentation

## 2014-10-31 DIAGNOSIS — Z7901 Long term (current) use of anticoagulants: Secondary | ICD-10-CM | POA: Insufficient documentation

## 2014-10-31 DIAGNOSIS — Z87891 Personal history of nicotine dependence: Secondary | ICD-10-CM | POA: Insufficient documentation

## 2014-10-31 DIAGNOSIS — G8929 Other chronic pain: Secondary | ICD-10-CM | POA: Insufficient documentation

## 2014-10-31 DIAGNOSIS — R110 Nausea: Secondary | ICD-10-CM | POA: Insufficient documentation

## 2014-10-31 DIAGNOSIS — Z880 Allergy status to penicillin: Secondary | ICD-10-CM | POA: Diagnosis not present

## 2014-10-31 DIAGNOSIS — R079 Chest pain, unspecified: Secondary | ICD-10-CM | POA: Diagnosis not present

## 2014-10-31 DIAGNOSIS — Z7902 Long term (current) use of antithrombotics/antiplatelets: Secondary | ICD-10-CM | POA: Diagnosis not present

## 2014-10-31 DIAGNOSIS — E785 Hyperlipidemia, unspecified: Secondary | ICD-10-CM | POA: Insufficient documentation

## 2014-10-31 DIAGNOSIS — M1990 Unspecified osteoarthritis, unspecified site: Secondary | ICD-10-CM | POA: Insufficient documentation

## 2014-10-31 DIAGNOSIS — Z954 Presence of other heart-valve replacement: Secondary | ICD-10-CM | POA: Diagnosis not present

## 2014-10-31 DIAGNOSIS — K219 Gastro-esophageal reflux disease without esophagitis: Secondary | ICD-10-CM | POA: Diagnosis not present

## 2014-10-31 DIAGNOSIS — Z8673 Personal history of transient ischemic attack (TIA), and cerebral infarction without residual deficits: Secondary | ICD-10-CM | POA: Diagnosis not present

## 2014-10-31 DIAGNOSIS — R0602 Shortness of breath: Secondary | ICD-10-CM | POA: Insufficient documentation

## 2014-10-31 DIAGNOSIS — I10 Essential (primary) hypertension: Secondary | ICD-10-CM | POA: Diagnosis not present

## 2014-10-31 LAB — I-STAT TROPONIN, ED
Troponin i, poc: 0 ng/mL (ref 0.00–0.08)
Troponin i, poc: 0 ng/mL (ref 0.00–0.08)

## 2014-10-31 LAB — CBC
HEMATOCRIT: 33.6 % — AB (ref 36.0–46.0)
Hemoglobin: 11 g/dL — ABNORMAL LOW (ref 12.0–15.0)
MCH: 26.2 pg (ref 26.0–34.0)
MCHC: 32.7 g/dL (ref 30.0–36.0)
MCV: 80 fL (ref 78.0–100.0)
PLATELETS: 311 10*3/uL (ref 150–400)
RBC: 4.2 MIL/uL (ref 3.87–5.11)
RDW: 15.4 % (ref 11.5–15.5)
WBC: 9.6 10*3/uL (ref 4.0–10.5)

## 2014-10-31 LAB — PROTIME-INR
INR: 1.76 — ABNORMAL HIGH (ref 0.00–1.49)
PROTHROMBIN TIME: 20.5 s — AB (ref 11.6–15.2)

## 2014-10-31 LAB — BASIC METABOLIC PANEL
ANION GAP: 8 (ref 5–15)
BUN: 5 mg/dL — AB (ref 6–20)
CHLORIDE: 96 mmol/L — AB (ref 101–111)
CO2: 24 mmol/L (ref 22–32)
Calcium: 8.9 mg/dL (ref 8.9–10.3)
Creatinine, Ser: 0.61 mg/dL (ref 0.44–1.00)
GFR calc Af Amer: >60 mL/min (ref >60)
GFR calc non Af Amer: >60 mL/min (ref >60)
GLUCOSE: 125 mg/dL — AB (ref 65–99)
Potassium: 3.7 mmol/L (ref 3.5–5.1)
SODIUM: 128 mmol/L — AB (ref 135–145)

## 2014-10-31 MED ORDER — NITROGLYCERIN 0.4 MG SL SUBL: 0.4000 mg | SUBLINGUAL_TABLET | SUBLINGUAL | Status: DC | PRN

## 2014-10-31 MED ORDER — LORAZEPAM 2 MG/ML IJ SOLN
0.5000 mg | Freq: Once | INTRAMUSCULAR | Status: AC
  Administered 2014-10-31: 0.5 mg via INTRAVENOUS
  Filled 2014-10-31: qty 1

## 2014-10-31 MED ORDER — ALBUTEROL SULFATE (2.5 MG/3ML) 0.083% IN NEBU
5.0000 mg | INHALATION_SOLUTION | Freq: Once | RESPIRATORY_TRACT | Status: AC
  Administered 2014-10-31: 5 mg via RESPIRATORY_TRACT
  Filled 2014-10-31: qty 6

## 2014-10-31 MED ORDER — NITROGLYCERIN 0.4 MG SL SUBL
0.4000 mg | SUBLINGUAL_TABLET | SUBLINGUAL | Status: AC | PRN
  Administered 2014-10-31 (×3): 0.4 mg via SUBLINGUAL
  Filled 2014-10-31: qty 1

## 2014-10-31 NOTE — Discharge Instructions (Signed)

## 2014-10-31 NOTE — ED Notes (Signed)
Chest pain/SOB starting when she woke up this morning and worsening throughout day. Pt now hyperventilating in triage. States pain is left chest radiating to left arm. Describes a long heart history with an MI last year. Complaining of nausea, sweating, and weakness also

## 2014-10-31 NOTE — ED Provider Notes (Signed)
CSN: [id number]     Arrival date & time 10/31/14  1422 History   First MD Initiated Contact with Patient 10/31/14 1454     Chief Complaint  Patient presents with  . Chest Pain  . Shortness of Breath  . Nausea     (Consider location/radiation/quality/duration/timing/severity/associated sxs/prior Treatment) HPI   81 year old female comes in today complaining of chest pain and shortness of breath that was present when she woke up this morning and has worsened throughout the day. She states this on the left side and radiates to her arm. 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 She has not taken any medications for this. She states her shortness of breath COPD. Took her usual COPD medications one time this a.m. Chst pain sob began today-worst 9/10 now 7/10.  No fever, swelling, no on oxygnen at home, no fever chills no intervetnion.    Past Medical History  Diagnosis Date  . Dyslipidemia     takes Lipitor daily  . Arthritis   . Urine incontinence   . Diverticulosis   . IC (interstitial cystitis)   . S/P aortic valve replacement     2005  . H/O hiatal hernia   . History of TIA (transient ischemic attack)     2013-- RESIDUAL PERIPHERAL VISION RIGHT EYE--  RESOLVED  . History of bacterial endocarditis   . Foot drop     SINCE 1987  . History of gastritis   . Chronic back pain   . Hypertension   . Atrial fibrillation   . GERD (gastroesophageal reflux disease)   . Hearing impaired     Bilateral hearing aids  . History of stomach ulcers     many yrs ago  . Dysrhythmia     Atrial Fibrillation   Past Surgical History  Procedure Laterality Date  . Abdominal hysterectomy  1967  . Aortic valve replacement  09-29-2003     [facility] pericardial tissue valve  . Cholecystectomy  1993  . Vein ligation  1969    RIGHT LOWER LEG  . Dilation and curettage of uterus    . Ercp N/A 08/10/2012    Procedure: ENDOSCOPIC RETROGRADE CHOLANGIOPANCREATOGRAPHY (ERCP);  Surgeon: Inda Castle, MD;  Location: [place];  Service: Gastroenterology;  Laterality: N/A;  . Cataract extraction w/ intraocular lens  implant, bilateral    . Appendectomy  1933  . Cardiac catheterization  07-15-2003 DR HELEN PRESTON    MODERATE TO MODERATELY SEVERE CALCIFIC AORTIC STENOSIS/  NORMAL CORONARY ARTERIES  . Right shoulder arthroscopy w/ debridement rotator cuff and labral tear/ acrominoplasty/ distal clavicle excision/ ca ligament release  10-08-1999  . Cysto/ hydrodistention/ instillation clorpactin  MULTIPLE  last one 2009  . Mini-open right rotator cuff repair  07-12-2001  . Transvaginal tape procedure  07-30-2002  . Lumbar disc surgery  1985 &  2007  . Macroplastique urethral implantation  08-27-2009  . Transthoracic echocardiogram  08-10-2011    MILD LVH/  EF 55-60%/  NORMAL AVR TISSUE/ MILD MR/  MODERATE DILATED LA/  MILD DILATED RA  . Cardiovascular stress test  01-30-2012  DR NASHER    NORMAL NUCLEAR STUDY/  EF 73%/  NORMAL LVF  . Tonsillectomy and adenoidectomy    . Carpal tunnel release Bilateral   . Cysto with hydrodistension N/A 02/05/2013    Procedure: CYSTOSCOPY/HYDRODISTENSION;  Surgeon: Irine Seal, MD;  Location: [facility];  Service: Urology;  Laterality: N/A;  . Left heart catheterization with coronary angiogram N/A 10/18/2013  Procedure: LEFT HEART CATHETERIZATION WITH CORONARY ANGIOGRAM;  Surgeon: Jettie Booze, MD;  Location: [facility] CATH LAB;  Service: Cardiovascular;  Laterality: N/A;  . Esophagogastroduodenoscopy (egd) with propofol N/A 08/18/2014    Procedure: ESOPHAGOGASTRODUODENOSCOPY (EGD) WITH PROPOFOL;  Surgeon: Inda Castle, MD;  Location: [facility];  Service: Endoscopy;  Laterality: N/A;   Family History  Problem Relation Age of Onset  . Heart disease Father   . Coronary artery disease Brother   . Bipolar disorder Brother     commited suiside at 41yo  . Alcohol abuse Sister     sister #1  . Alcohol abuse Sister     sister #2  .  Colon cancer Maternal Aunt   . Stomach cancer Maternal Grandmother   . Esophageal cancer Neg Hx    History  Substance Use Topics  . Smoking status: Former Smoker    Types: Cigarettes    Quit date: 03/06/1990  . Smokeless tobacco: Never Used  . Alcohol Use: No   OB History    No data available     Review of Systems  All other systems reviewed and are negative.     Allergies  Amlodipine; Amoxicillin; Carafate; Cephalexin; Codeine; Irbesartan; Morphine and related; Neurontin; Nitrofurantoin monohyd macro; Prednisone; and Sulfa antibiotics  Home Medications   Prior to Admission medications   Medication Sig Start Date End Date Taking? Authorizing Provider  atenolol (TENORMIN) 25 MG tablet TAKE 1 TABLET (25 MG TOTAL) BY MOUTH DAILY. 08/15/14   Thayer Headings, MD  atorvastatin (LIPITOR) 20 MG tablet Take 20 mg by mouth daily.    Historical Provider, MD  CARTIA XT 120 MG 24 hr capsule TAKE 1 TABLET (120 MG TOTAL) BY MOUTH DAILY. 08/15/14   Thayer Headings, MD  clopidogrel (PLAVIX) 75 MG tablet Take 1 tablet (75 mg total) by mouth daily. 01/20/14   Liliane Shi, PA-C  diazepam (VALIUM) 5 MG tablet Take 5 mg by mouth as needed for anxiety.    Historical Provider, MD  HYDROcodone-acetaminophen (NORCO/VICODIN) 5-325 MG per tablet Take 1 tablet by mouth as needed for moderate pain.    Historical Provider, MD  lisinopril (PRINIVIL,ZESTRIL) 20 MG tablet Take 20 mg by mouth daily.    Historical Provider, MD  LORazepam (ATIVAN) 0.5 MG tablet Take 1 tab twice daily. 09/24/14   Amy S Esterwood, PA-C  ondansetron (ZOFRAN) 4 MG tablet Take 1 tab twice daily 09/24/14   Amy S Esterwood, PA-C  pantoprazole (PROTONIX) 40 MG tablet Take 1 tablet (40 mg total) by mouth daily. 08/25/14   Inda Castle, MD  promethazine (PHENERGAN) 12.5 MG tablet Take 1 tablet (12.5 mg total) by mouth every 6 (six) hours as needed for nausea or vomiting. 08/30/14   Jonetta Osgood, MD  ranitidine (ZANTAC) 150 MG tablet  Take 150 mg by mouth at bedtime.    Historical Provider, MD  scopolamine (TRANSDERM-SCOP, 1.5 MG,) 1 MG/3DAYS Apply patch to the skin every 3 days. 09/24/14   Amy S Esterwood, PA-C  warfarin (COUMADIN) 5 MG tablet Take 1.5 tablets (7.5 mg total) by mouth daily at 6 PM. Patient taking differently: Take 5 mg by mouth daily at 6 PM. Patient states that she takes 1(5mg ) tablet by mouth Monday -Saturday and 1/2  tablet (2.5mg ) on Sunday.--in the PM 10/21/13   Brett Canales, PA-C  warfarin (COUMADIN) 5 MG tablet Take 1 tablet by mouth daily or as directed by coumadin clinic 10/27/14   Thayer Headings, MD  zolpidem (AMBIEN) 10 MG tablet Take 10 mg by mouth at bedtime as needed for sleep.    Historical Provider, MD   BP 160/67 mmHg  Pulse 66  Temp(Src) 97.7 F (36.5 C) (Oral)  Resp 17  SpO2 100% Physical Exam  Constitutional: She is oriented to person, place, and time. She appears well-developed and well-nourished.  HENT:  Head: Normocephalic and atraumatic.  Right Ear: External ear normal.  Left Ear: External ear normal.  Nose: Nose normal.  Mouth/Throat: Oropharynx is clear and moist.  Eyes: Conjunctivae and EOM are normal. Pupils are equal, round, and reactive to light.  Neck: Normal range of motion. Neck supple. No JVD present. No tracheal deviation present. No thyromegaly present.  Cardiovascular: Normal rate, regular rhythm, normal heart sounds and intact distal pulses.   Pulmonary/Chest: Effort normal and breath sounds normal. She has no wheezes.  Abdominal: Soft. Bowel sounds are normal. She exhibits no mass. There is no tenderness. There is no guarding.  Musculoskeletal: Normal range of motion.  Lymphadenopathy:    She has no cervical adenopathy.  Neurological: She is alert and oriented to person, place, and time. She has normal reflexes. No cranial nerve deficit or sensory deficit. Gait normal. GCS eye subscore is 4. GCS verbal subscore is 5. GCS motor subscore is 6.  Reflex Scores:       Bicep reflexes are 2+ on the right side and 2+ on the left side.      Patellar reflexes are 2+ on the right side and 2+ on the left side. Strength is normal and equal throughout. Cranial nerves grossly intact. Patient fluent. No gross ataxia and patient able to ambulate without difficulty.  Skin: Skin is warm and dry.  Psychiatric: She has a normal mood and affect. Her behavior is normal. Judgment and thought content normal.  Nursing note and vitals reviewed.   ED Course  Procedures (including critical care time) Labs Review Labs Reviewed  BASIC METABOLIC PANEL - Abnormal; Notable for the following:    Sodium 128 (*)    Chloride 96 (*)    Glucose, Bld 125 (*)    BUN 5 (*)    All other components within normal limits  CBC - Abnormal; Notable for the following:    Hemoglobin 11.0 (*)    HCT 33.6 (*)    All other components within normal limits  PROTIME-INR - Abnormal; Notable for the following:    Prothrombin Time 20.5 (*)    INR 1.76 (*)    All other components within normal limits  I-STAT TROPOININ, ED  I-STAT TROPOININ, ED    Imaging Review No results found.   EKG Interpretation   Date/Time:  Friday October 31 2014 14:28:23 EDT Ventricular Rate:  74 PR Interval:    QRS Duration: 98 QT Interval:  409 QTC Calculation: 182 R Axis:   -65 Text Interpretation:  Atrial fibrillation Left anterior fascicular block  Low voltage, precordial leads Probable anteroseptal infarct, old No  significant change since last tracing Confirmed by Glendy Barsanti MD, Andee Poles  ([id number]) on 10/31/2014 3:05:40 PM      MDM   Final diagnoses:  Chest pain, unspecified chest pain type    Patient's pain has improved here. Patient with troponin and delta troponin normal. I have discussed return precautions with the patient and her son may voice understanding. We have also discussed need for close follow-up and they voice understanding.  Pattricia Boss, MD 11/01/14 [phone]

## 2014-11-03 ENCOUNTER — Encounter (HOSPITAL_COMMUNITY): Payer: Self-pay

## 2014-11-05 ENCOUNTER — Encounter (HOSPITAL_COMMUNITY): Payer: Self-pay

## 2014-11-05 DIAGNOSIS — J441 Chronic obstructive pulmonary disease with (acute) exacerbation: Secondary | ICD-10-CM | POA: Diagnosis not present

## 2014-11-07 ENCOUNTER — Encounter (HOSPITAL_COMMUNITY): Payer: Self-pay

## 2014-11-10 ENCOUNTER — Encounter (HOSPITAL_COMMUNITY): Payer: Self-pay

## 2014-11-12 ENCOUNTER — Ambulatory Visit (INDEPENDENT_AMBULATORY_CARE_PROVIDER_SITE_OTHER): Payer: Medicare Other | Admitting: Surgery

## 2014-11-12 ENCOUNTER — Encounter (HOSPITAL_COMMUNITY): Payer: Self-pay

## 2014-11-12 DIAGNOSIS — I214 Non-ST elevation (NSTEMI) myocardial infarction: Secondary | ICD-10-CM | POA: Diagnosis not present

## 2014-11-12 DIAGNOSIS — I482 Chronic atrial fibrillation, unspecified: Secondary | ICD-10-CM

## 2014-11-12 DIAGNOSIS — I480 Paroxysmal atrial fibrillation: Secondary | ICD-10-CM

## 2014-11-12 LAB — POCT INR: INR: 3.1

## 2014-11-14 ENCOUNTER — Encounter (HOSPITAL_COMMUNITY): Payer: Self-pay

## 2014-11-17 ENCOUNTER — Encounter (HOSPITAL_COMMUNITY): Payer: Self-pay

## 2014-11-17 DIAGNOSIS — M542 Cervicalgia: Secondary | ICD-10-CM | POA: Diagnosis not present

## 2014-11-17 DIAGNOSIS — M5416 Radiculopathy, lumbar region: Secondary | ICD-10-CM | POA: Diagnosis not present

## 2014-11-17 DIAGNOSIS — M545 Low back pain: Secondary | ICD-10-CM | POA: Diagnosis not present

## 2014-11-17 DIAGNOSIS — M4692 Unspecified inflammatory spondylopathy, cervical region: Secondary | ICD-10-CM | POA: Diagnosis not present

## 2014-11-17 DIAGNOSIS — Z6828 Body mass index (BMI) 28.0-28.9, adult: Secondary | ICD-10-CM | POA: Diagnosis not present

## 2014-11-19 ENCOUNTER — Encounter (HOSPITAL_COMMUNITY): Payer: Self-pay

## 2014-11-21 ENCOUNTER — Encounter (HOSPITAL_COMMUNITY): Payer: Self-pay

## 2014-11-24 ENCOUNTER — Encounter (HOSPITAL_COMMUNITY): Payer: Self-pay

## 2014-11-24 ENCOUNTER — Other Ambulatory Visit: Payer: Self-pay

## 2014-11-24 MED ORDER — CLOPIDOGREL BISULFATE 75 MG PO TABS: 75.0000 mg | ORAL_TABLET | Freq: Every day | ORAL | Status: DC

## 2014-11-25 DIAGNOSIS — R312 Other microscopic hematuria: Secondary | ICD-10-CM | POA: Diagnosis not present

## 2014-11-25 DIAGNOSIS — R351 Nocturia: Secondary | ICD-10-CM | POA: Diagnosis not present

## 2014-11-26 ENCOUNTER — Encounter (HOSPITAL_COMMUNITY): Payer: Self-pay

## 2014-11-28 ENCOUNTER — Encounter (HOSPITAL_COMMUNITY): Payer: Self-pay

## 2014-12-01 ENCOUNTER — Encounter (HOSPITAL_COMMUNITY): Payer: Self-pay

## 2014-12-02 ENCOUNTER — Other Ambulatory Visit (HOSPITAL_COMMUNITY): Payer: Self-pay | Admitting: Pulmonary Disease

## 2014-12-02 ENCOUNTER — Telehealth: Payer: Self-pay | Admitting: Pulmonary Disease

## 2014-12-02 ENCOUNTER — Encounter: Payer: Self-pay | Admitting: Pulmonary Disease

## 2014-12-02 ENCOUNTER — Ambulatory Visit (INDEPENDENT_AMBULATORY_CARE_PROVIDER_SITE_OTHER): Payer: Medicare Other | Admitting: Pulmonary Disease

## 2014-12-02 VITALS — BP 132/70 | HR 58 | Ht 60.0 in | Wt 136.0 lb

## 2014-12-02 DIAGNOSIS — R05 Cough: Secondary | ICD-10-CM | POA: Insufficient documentation

## 2014-12-02 DIAGNOSIS — R1314 Dysphagia, pharyngoesophageal phase: Secondary | ICD-10-CM

## 2014-12-02 DIAGNOSIS — J432 Centrilobular emphysema: Secondary | ICD-10-CM | POA: Diagnosis not present

## 2014-12-02 DIAGNOSIS — I270 Primary pulmonary hypertension: Secondary | ICD-10-CM

## 2014-12-02 DIAGNOSIS — I214 Non-ST elevation (NSTEMI) myocardial infarction: Secondary | ICD-10-CM | POA: Diagnosis not present

## 2014-12-02 DIAGNOSIS — I272 Pulmonary hypertension, unspecified: Secondary | ICD-10-CM

## 2014-12-02 DIAGNOSIS — R918 Other nonspecific abnormal finding of lung field: Secondary | ICD-10-CM

## 2014-12-02 DIAGNOSIS — R059 Cough, unspecified: Secondary | ICD-10-CM

## 2014-12-02 DIAGNOSIS — K219 Gastro-esophageal reflux disease without esophagitis: Secondary | ICD-10-CM

## 2014-12-02 DIAGNOSIS — B370 Candidal stomatitis: Secondary | ICD-10-CM

## 2014-12-02 DIAGNOSIS — J439 Emphysema, unspecified: Secondary | ICD-10-CM | POA: Insufficient documentation

## 2014-12-02 MED ORDER — NYSTATIN 100000 UNIT/ML MT SUSP: 5.0000 mL | Freq: Three times a day (TID) | OROMUCOSAL | Status: DC

## 2014-12-02 NOTE — Progress Notes (Signed)
Subjective:    Patient ID: Brittany [name], female    DOB: 03-20-1928, 81 y.o.   MRN: [id number]  HPI According to documentation from the patient's primary care physician she was seen in the emergency room for chest discomfort and dyspnea that improved after treatment with oxygen and nebulizer therapy. At her follow-up appointment on 11/05/14 she continued to have a nonproductive cough and dyspnea. However, the patient denied wheezing or chest discomfort at that time. She was given an albuterol inhaler to use on an as-needed basis as well as an IM injection of Depo-Medrol. She was continued on Breo 200/25. She reports she was diagnosed with COPD after imaging this year and was started on Breo at that time. She reports she started noticing dyspnea on exertion starting a couple of years ago.  She is limited in terms of exertion due to a drop foot and doesn't use any stairs. She has also been using a walker lately to help protect her from falls. She doesn't feel like her dyspnea is progressive. She reports that the severity of her dyspnea seems to vary day to day. She does report more dyspnea with sitting outside.  She also reports she has noticed wheezing over the last week or so. She developed a cough starting in June-July of this year. This seems to be exacerbated by exposure to heat & humidity.  The cough seems to be triggered by eating. She reports she has lost 11 pounds in the last month because she has dysphagia with beans, peas, or rice.  However she doesn't have the problem with oatmeal. She reports odynophagia in her mid-chest as well. She doesn't feel the Memory Dance has helped significantly. She reports she uses her rescue inhaler a couple of times a day and it does seem to help with her cough in addition to Mucinex. No fever, chills, or sweats. No adenopathy in her neck, groin, or axilla. She reports over the last week she has awoken with reflux a couple of days in a row. She reports a brash water taste  preceding even this week. She feels like her voice is more "husky" over the last week as well. She reports nausea without emesis. No abdominal pain or diarrhea. Underwent EGD on 08/18/14 which showed some gastritis but was otherwise normal.   Review of Systems Denies any sinus congestion or drainage. No headaches or focal weakness, numbness, or tingling. No dysuria or hematuria. A pertinent 14 point review of systems is negative except as per the history of presenting illness.  Allergies  Allergen Reactions  . Amlodipine     Other reaction(s): edema  . Amoxicillin Diarrhea and Nausea And Vomiting  . Carafate [Sucralfate] Swelling    Swelling in knees and blocky red marks on them.  . Cephalexin Diarrhea  . Codeine Nausea Only  . Irbesartan Swelling    Facial and hand numbness and swelling. Patient believes reaction was with this medication but isn't 100% sure. She is currently tolerating lisinopril.  Marland Kitchen Morphine And Related Other (See Comments)    HX ADDICTION / WITHDRAWAL  . Neurontin [Gabapentin] Swelling    Legs swell  . Nitrofurantoin Monohyd Macro Diarrhea and Nausea Only  . Sulfa Antibiotics Rash   Current Outpatient Prescriptions on File Prior to Visit  Medication Sig Dispense Refill  . atenolol (TENORMIN) 25 MG tablet TAKE 1 TABLET (25 MG TOTAL) BY MOUTH DAILY. (Patient taking differently: Take 1 tablet (25 mg total) by mouth daily.) 30 tablet 5  . atorvastatin (  LIPITOR) 20 MG tablet Take 20 mg by mouth daily.    Marland Kitchen CARTIA XT 120 MG 24 hr capsule TAKE 1 TABLET (120 MG TOTAL) BY MOUTH DAILY. (Patient taking differently: Take 1 tablet (120 mg total) by mouth daily.) 30 capsule 5  . clopidogrel (PLAVIX) 75 MG tablet Take 1 tablet (75 mg total) by mouth daily. 30 tablet 11  . diazepam (VALIUM) 5 MG tablet Take 5 mg by mouth as needed for anxiety.    . Fluticasone Furoate-Vilanterol (BREO ELLIPTA IN) Inhale into the lungs.    Marland Kitchen HYDROcodone-acetaminophen (NORCO/VICODIN) 5-325 MG per tablet  Take 1 tablet by mouth as needed for moderate pain.    Marland Kitchen lisinopril (PRINIVIL,ZESTRIL) 20 MG tablet Take 20 mg by mouth daily.    Marland Kitchen LORazepam (ATIVAN) 0.5 MG tablet Take 1 tab twice daily. 60 tablet 3  . ondansetron (ZOFRAN) 4 MG tablet Take 1 tab twice daily 60 tablet 3  . pantoprazole (PROTONIX) 40 MG tablet Take 1 tablet (40 mg total) by mouth daily. 90 tablet 3  . promethazine (PHENERGAN) 12.5 MG tablet Take 1 tablet (12.5 mg total) by mouth every 6 (six) hours as needed for nausea or vomiting. 30 tablet 0  . ranitidine (ZANTAC) 150 MG tablet Take 150 mg by mouth at bedtime.    Marland Kitchen scopolamine (TRANSDERM-SCOP, 1.5 MG,) 1 MG/3DAYS Apply patch to the skin every 3 days. 10 patch 3  . warfarin (COUMADIN) 5 MG tablet Take 1.5 tablets (7.5 mg total) by mouth daily at 6 PM. (Patient taking differently: Take 5 mg by mouth daily at 6 PM. Take 1 tablet (5mg  total ) by mouth once every evening  Monday through Saturday. Take 1/2  tablet (2.5mg  total) in the evening on Sunday.) 30 tablet 5  . warfarin (COUMADIN) 5 MG tablet Take 1 tablet by mouth daily or as directed by coumadin clinic (Patient not taking: Reported on 10/31/2014) 30 tablet 3  . zolpidem (AMBIEN) 10 MG tablet Take 10 mg by mouth at bedtime as needed for sleep.     No current facility-administered medications on file prior to visit.   Past Medical History  Diagnosis Date  . Dyslipidemia     takes Lipitor daily  . Arthritis   . Urine incontinence   . Diverticulosis   . IC (interstitial cystitis)   . S/P aortic valve replacement     2005  . H/O hiatal hernia   . History of TIA (transient ischemic attack)     20 13-- RESIDUAL PERIPHERAL VISION RIGHT EYE--  RESOLVED  . History of bacterial endocarditis   . Foot drop     SINCE 1987  . History of gastritis   . Chronic back pain   . Hypertension   . Atrial fibrillation   . GERD (gastroesophageal reflux disease)   . Hearing impaired     Bilateral hearing aids  . History of stomach  ulcers     many yrs ago  . Dysrhythmia     Atrial Fibrillation  . Pulmonary hypertension 10/2013    Based on TTE   Past Surgical History  Procedure Laterality Date  . Abdominal hysterectomy  1967  . Aortic valve replacement  09-29-2003     [facility] pericardial tissue valve  . Cholecystectomy  1993  . Vein ligation  1969    RIGHT LOWER LEG  . Dilation and curettage of uterus    . Ercp N/A 08/10/2012    Procedure: ENDOSCOPIC RETROGRADE CHOLANGIOPANCREATOGRAPHY (ERCP);  Surgeon: Herbie Baltimore  Shaaron Adler, MD;  Location: [facility];  Service: Gastroenterology;  Laterality: N/A;  . Cataract extraction w/ intraocular lens  implant, bilateral    . Appendectomy  1933  . Cardiac catheterization  07-15-2003 DR HELEN PRESTON    MODERATE TO MODERATELY SEVERE CALCIFIC AORTIC STENOSIS/  NORMAL CORONARY ARTERIES  . Right shoulder arthroscopy w/ debridement rotator cuff and labral tear/ acrominoplasty/ distal clavicle excision/ ca ligament release  10-08-1999  . Cysto/ hydrodistention/ instillation clorpactin  MULTIPLE  last one 2009  . Mini-open right rotator cuff repair  07-12-2001  . Transvaginal tape procedure  07-30-2002  . Lumbar disc surgery  1985 &  2007  . Macroplastique urethral implantation  08-27-2009  . Transthoracic echocardiogram  08-10-2011    MILD LVH/  EF 55-60%/  NORMAL AVR TISSUE/ MILD MR/  MODERATE DILATED LA/  MILD DILATED RA  . Cardiovascular stress test  01-30-2012  DR NASHER    NORMAL NUCLEAR STUDY/  EF 73%/  NORMAL LVF  . Tonsillectomy and adenoidectomy    . Carpal tunnel release Bilateral   . Cysto with hydrodistension N/A 02/05/2013    Procedure: CYSTOSCOPY/HYDRODISTENSION;  Surgeon: Irine Seal, MD;  Location: [facility];  Service: Urology;  Laterality: N/A;  . Left heart catheterization with coronary angiogram N/A 10/18/2013    Procedure: LEFT HEART CATHETERIZATION WITH CORONARY ANGIOGRAM;  Surgeon: Jettie Booze, MD;  Location: [facility] CATH [facility];  Service:  Cardiovascular;  Laterality: N/A;  . Esophagogastroduodenoscopy (egd) with propofol N/A 08/18/2014    Procedure: ESOPHAGOGASTRODUODENOSCOPY (EGD) WITH PROPOFOL;  Surgeon: Inda Castle, MD;  Location: [facility];  Service: Endoscopy;  Laterality: N/A;   Family History  Problem Relation Age of Onset  . Heart disease Father   . Coronary artery disease Brother   . Bipolar disorder Brother     commited suiside at 62yo  . Alcohol abuse Sister     sister #1  . Alcohol abuse Sister     sister #2  . Colon cancer Maternal Aunt   . Stomach cancer Maternal Grandmother   . Lung disease Neg Hx   . Esophageal cancer Maternal Grandfather   . Bipolar disorder Son     Committed Suicide   Social History   Social History  . Marital Status: Widowed    Spouse Name: N/A  . Number of Children: 4  . Years of Education: N/A   Occupational History  . retired    Social History Main Topics  . Smoking status: Former Smoker -- 0.50 packs/day for 40 years    Types: Cigarettes    Start date: 05/06/1949    Quit date: 03/06/1990  . Smokeless tobacco: Never Used  . Alcohol Use: No     Comment: Remote social EtOH  . Drug Use: No  . Sexual Activity: No   Other Topics Concern  . None   Social History Narrative   She is originally from [place], [place]. She has previously lived in [place] for 10 years. She has traveled to all 64 states as well as every [place] in [place]. Prior travel to [place], [place], [place]. Previously worked as an Optometrist. Remote exposure to a parakeet for 2 years in 32s. No known asbestos, mold, or hot tub exposure. She enjoys doing needle point & hand crafts.        Objective:   Physical Exam Blood pressure 132/70, pulse 58, height 5' (1.524 m), weight 136 lb (61.689 kg), SpO2 99 %. General:  Awake. Alert. No acute distress.  Elderly female with a walker.  Integument:  Warm & dry. No rash on exposed skin. Minor bruising on bilateral upper extremities of varying ages. Lymphatics:  No  appreciated cervical or supraclavicular lymphadenoapthy. HEENT:  Moist mucus membranes. No oral ulcers. No scleral injection or icterus. Oral thrush is present on posterior tongue. No nasal turbinate swelling or erythema. Cardiovascular:  Regular rate. No edema. No appreciable JVD.  Pulmonary:  Good aeration & clear to auscultation bilaterally. Symmetric chest wall expansion. No accessory muscle use on room air. Abdomen: Soft. Normal bowel sounds. Nondistended. Grossly nontender. Musculoskeletal:  Normal bulk and tone. Kyphosis noted. Hand grip strength 5/5 bilaterally. No joint effusion appreciated. Mild synovial thickening of bilateral PIP & DIP joints with slight ulnar deviation. Neurological:  CN 2-12 grossly in tact. No meningismus. Moving all 4 extremities equally. Psychiatric:  Mood and affect congruent. Speech normal rhythm, rate & tone.   IMAGING CXR PA/LAT 10/31/14 (personally reviewed by me): Pronounced curvature of the thoracic spine. Cardiomegaly noted. No nodule or opacity appreciated. No pleural effusion. Mediastinum otherwise normal in contour.  CT CHEST W/ 10/07/14 (personally reviewed by me): No pleural effusion or thickening. Borderline precarinal lymph node measuring 1 cm in short axis. Upper lobe predominant centrilobular emphysema with some cystic changes. 8 mm & 9 mm nodules within the left costophrenic sulcus with some spiculation. No other opacity appreciated.  CARDIAC TTE (10/12/13): LV normal in size with moderate LVH. EF 60-65%. Adequate for wall motion assessment. Grade 2 diastolic dysfunction. LA & RA severely dilated. RV poorly visualized but appears mild to moderately enlarged with "likely normal function". Pulmonary artery systolic pressure 41 mmHg. IVC dilated. No pericardial effusion. No aortic stenosis or regurgitation. Mild mitral regurgitation without stenosis. Mild pulmonic and tricuspid valve regurgitation without stenosis.  LABS 11/12/14 INR: 3.1  10/31/14 CBC:  9.6/1.0/33.6/311 BMP: 128/3.11/04/22/5/0.61/125/8.9    Assessment & Plan:  The patient is an 81 year old female with a very complex medical history. Her CT scan performed in June shows emphysematous changes which are likely secondary to her prior history of tobacco use. Additionally she had subcentimeter nodules noted within the left costophrenic sulcus. It is certainly reasonable to assume that the patient's dyspnea over the last couple of years is likely due to underlying COPD in the setting of her emphysema and prior tobacco use. Her persistent cough is more perplexing however and I believe multifactorial. I believe the worsening of her symptoms of labor likely due to her oral thrush causing the sore throat and findings on physical exam today. She has had no symptomatic benefit from [facility] I do not believe this medication needs to be continued at this time. Further in reviewing her electronic medical record she had an upper endoscopy in May which showed no evidence of esophagitis but did have gastritis. I would have expected inflammation if silent laryngo-esophageal reflux were contributing to the patient's ongoing cough and voice changes. As an additional saw the patient had an echocardiogram in July 2015 which did suggest pulmonary hypertension likely secondary to the patient's prior aortic valve disease and diastolic dysfunction of her left ventricle. At this time I do not feel that pulmonary arterial vasodilator therapy is warranted. I am investigating her symptoms further while treating her oral thrush. I instructed the patient to contact my office if she had any further questions or concerns before her next appointment.  1. Cough/dyspnea: Possibly secondary to underlying COPD versus silent laryngo-esophageal reflux. Checking for pulmonary function testing with bronchodilator challenge as well as  6 minute walk test. 2. Centrilobular emphysema: Patient quite probably has underlying COPD. Checking full  pulmonary function testing. Screening for alpha-1 antitrypsin deficiency. Stopping Breo. Patient will continue using albuterol inhaler as needed. 3. Left lower lobe lung nodules on CT: Plan for repeat CT scan of the chest without contrast in mid to late September. 4. Pulmonary hypertension: Likely secondary to patient's left ventricular dysfunction & prior aortic valve disease. Cannot completely discount a contribution from underlying emphysema & probable COPD. Plan to readdress with patient next appointment and likely repeat transthoracic echocardiogram. Holding off on serum testing for autoimmune disease at this time. Also holding off on treatment with pulmonary arterial vasodilator therapy. 5. Oral thrush: Patient to stop use of inhaled steroid therapy. Prescribing nystatin swish and swallow 3 times a day until sore throat & oral thrush resolve. 6. GERD without esophagitis: Patient continuing on Protonix & Zantac as prescribed. Check a modified barium swallow. 7. Dysphagia/odynophagia: Checking modified barium swallow. 8. Follow-up: Patient to return to clinic in 4 weeks or sooner if needed.

## 2014-12-02 NOTE — Telephone Encounter (Signed)
IMAGING CXR PA/LAT 10/31/14 (personally reviewed by me): Pronounced curvature of the thoracic spine. Cardiomegaly noted. No nodule or opacity appreciated. No pleural effusion. Mediastinum otherwise normal in contour.  CT CHEST W/ 10/07/14 (personally reviewed by me): No pleural effusion or thickening. Borderline precarinal lymph node measuring 1 cm in short axis. Upper lobe predominant centrilobular emphysema with some cystic changes. 8 mm & 9 mm nodules within the left costophrenic sulcus with some spiculation. No other opacity appreciated.  CARDIAC TTE (10/12/13): LV normal in size with moderate LVH. EF 60-65%. Adequate for wall motion assessment. Grade 2 diastolic dysfunction. LA & RA severely dilated. RV poorly visualized but appears mild to moderately enlarged with "likely normal function". Pulmonary artery systolic pressure 41 mmHg. IVC dilated. No pericardial effusion. No aortic stenosis or regurgitation. Mild mitral regurgitation without stenosis. Mild pulmonic and tricuspid valve regurgitation without stenosis.  LABS 11/12/14 INR: 3.1  10/31/14 CBC: 9.6/1.0/33.6/311 BMP: 128/3.11/04/22/5/0.61/125/8.9

## 2014-12-02 NOTE — Patient Instructions (Signed)
1. Continue taking your Protonix and Zantac as prescribed. I am checking a swallowing test to see if something like uncontrolled reflux could be contributing to your cough. 2. Stop taking Breo. You can continue taking your albuterol inhaler as needed. We are going to check breathing tests before your next appointment to determine how severe your COPD may be. 3. I'm ordering a repeat CT scan of her chest to follow the lung nodules in her left lower lung. This will be done in middle to late September. 4. We are sending in a prescription for nystatin liquid for you to swish gargle and swallow 3 times a day until the thrush on the back of her tongue and your pain with swallowing resolves. 5. Please contact my office if you have any further questions or concerns. 6. You'll return to clinic in 4 weeks.

## 2014-12-03 ENCOUNTER — Encounter (HOSPITAL_COMMUNITY): Payer: Self-pay

## 2014-12-05 ENCOUNTER — Encounter (HOSPITAL_COMMUNITY): Payer: Self-pay

## 2014-12-08 ENCOUNTER — Encounter (HOSPITAL_COMMUNITY): Payer: Self-pay

## 2014-12-09 ENCOUNTER — Ambulatory Visit (INDEPENDENT_AMBULATORY_CARE_PROVIDER_SITE_OTHER): Payer: Medicare Other | Admitting: *Deleted

## 2014-12-09 DIAGNOSIS — I214 Non-ST elevation (NSTEMI) myocardial infarction: Secondary | ICD-10-CM | POA: Diagnosis not present

## 2014-12-09 DIAGNOSIS — I482 Chronic atrial fibrillation, unspecified: Secondary | ICD-10-CM

## 2014-12-09 DIAGNOSIS — I480 Paroxysmal atrial fibrillation: Secondary | ICD-10-CM | POA: Diagnosis not present

## 2014-12-09 LAB — POCT INR: INR: 3.3

## 2014-12-10 ENCOUNTER — Encounter (HOSPITAL_COMMUNITY): Payer: Self-pay

## 2014-12-11 ENCOUNTER — Ambulatory Visit (HOSPITAL_COMMUNITY)
Admission: RE | Admit: 2014-12-11 | Discharge: 2014-12-11 | Disposition: A | Discharge status: Home / Self Care | Payer: Medicare Other | Source: Ambulatory Visit | Attending: Pulmonary Disease | Admitting: Pulmonary Disease

## 2014-12-11 DIAGNOSIS — K449 Diaphragmatic hernia without obstruction or gangrene: Secondary | ICD-10-CM | POA: Diagnosis not present

## 2014-12-11 DIAGNOSIS — R1314 Dysphagia, pharyngoesophageal phase: Secondary | ICD-10-CM

## 2014-12-11 DIAGNOSIS — R1313 Dysphagia, pharyngeal phase: Secondary | ICD-10-CM | POA: Insufficient documentation

## 2014-12-11 DIAGNOSIS — R059 Cough, unspecified: Secondary | ICD-10-CM

## 2014-12-11 DIAGNOSIS — R1310 Dysphagia, unspecified: Secondary | ICD-10-CM | POA: Diagnosis present

## 2014-12-11 DIAGNOSIS — R05 Cough: Secondary | ICD-10-CM | POA: Diagnosis not present

## 2014-12-11 DIAGNOSIS — K219 Gastro-esophageal reflux disease without esophagitis: Secondary | ICD-10-CM | POA: Insufficient documentation

## 2014-12-11 DIAGNOSIS — I10 Essential (primary) hypertension: Secondary | ICD-10-CM | POA: Insufficient documentation

## 2014-12-11 NOTE — Procedures (Signed)
Objective Swallowing Evaluation: Other (Comment)  Patient Details  Name: Brittany [name] MRN: [id number] Date of Birth: 1927-04-28  Today's Date: 12/11/2014 Time: SLP Start Time (ACUTE ONLY): 1310-SLP Stop Time (ACUTE ONLY): 1345 SLP Time Calculation (min) (ACUTE ONLY): 35 min  Past Medical History:  Past Medical History  Diagnosis Date  . Dyslipidemia     takes Lipitor daily  . Arthritis   . Urine incontinence   . Diverticulosis   . IC (interstitial cystitis)   . S/P aortic valve replacement     2005  . H/O hiatal hernia   . History of TIA (transient ischemic attack)     2013-- RESIDUAL PERIPHERAL VISION RIGHT EYE--  RESOLVED  . History of bacterial endocarditis   . Foot drop     SINCE 1987  . History of gastritis   . Chronic back pain   . Hypertension   . Atrial fibrillation   . GERD (gastroesophageal reflux disease)   . Hearing impaired     Bilateral hearing aids  . History of stomach ulcers     many yrs ago  . Dysrhythmia     Atrial Fibrillation  . Pulmonary hypertension 10/2013    Based on TTE   Past Surgical History:  Past Surgical History  Procedure Laterality Date  . Abdominal hysterectomy  1967  . Aortic valve replacement  09-29-2003     [facility] pericardial tissue valve  . Cholecystectomy  1993  . Vein ligation  1969    RIGHT LOWER LEG  . Dilation and curettage of uterus    . Ercp N/A 08/10/2012    Procedure: ENDOSCOPIC RETROGRADE CHOLANGIOPANCREATOGRAPHY (ERCP);  Surgeon: Inda Castle, MD;  Location: [place];  Service: Gastroenterology;  Laterality: N/A;  . Cataract extraction w/ intraocular lens  implant, bilateral    . Appendectomy  1933  . Cardiac catheterization  07-15-2003 DR HELEN PRESTON    MODERATE TO MODERATELY SEVERE CALCIFIC AORTIC STENOSIS/  NORMAL CORONARY ARTERIES  . Right shoulder arthroscopy w/ debridement rotator cuff and labral tear/ acrominoplasty/ distal clavicle excision/ ca ligament release  10-08-1999  . Cysto/  hydrodistention/ instillation clorpactin  MULTIPLE  last one 2009  . Mini-open right rotator cuff repair  07-12-2001  . Transvaginal tape procedure  07-30-2002  . Lumbar disc surgery  1985 &  2007  . Macroplastique urethral implantation  08-27-2009  . Transthoracic echocardiogram  08-10-2011    MILD LVH/  EF 55-60%/  NORMAL AVR TISSUE/ MILD MR/  MODERATE DILATED LA/  MILD DILATED RA  . Cardiovascular stress test  01-30-2012  DR NASHER    NORMAL NUCLEAR STUDY/  EF 73%/  NORMAL LVF  . Tonsillectomy and adenoidectomy    . Carpal tunnel release Bilateral   . Cysto with hydrodistension N/A 02/05/2013    Procedure: CYSTOSCOPY/HYDRODISTENSION;  Surgeon: Irine Seal, MD;  Location: [facility];  Service: Urology;  Laterality: N/A;  . Left heart catheterization with coronary angiogram N/A 10/18/2013    Procedure: LEFT HEART CATHETERIZATION WITH CORONARY ANGIOGRAM;  Surgeon: Jettie Booze, MD;  Location: [facility] CATH LAB;  Service: Cardiovascular;  Laterality: N/A;  . Esophagogastroduodenoscopy (egd) with propofol N/A 08/18/2014    Procedure: ESOPHAGOGASTRODUODENOSCOPY (EGD) WITH PROPOFOL;  Surgeon: Inda Castle, MD;  Location: [facility];  Service: Endoscopy;  Laterality: N/A;   HPI:  Other Pertinent Information: 81 yo female referred for MBS as pt with complaint of dysphagia, some cough triggered by eating.  SLP phoned pulmonary office to clarify  order and Britt Bottom confirmed need for MBS.  Pt PMH + for arthritis, TIA 2013, COPD, hiatal hernia, GERD, gastritis, stomach ulcer, HTN, Afib, pulmonary HTN, hearing loss, chronic back pain.  Pt reports globus sensation/throat tightness AFTER meals.    No Data Recorded  Assessment / Plan / Recommendation CHL IP CLINICAL IMPRESSIONS 12/11/2014  Therapy Diagnosis Mild pharyngeal phase dysphagia  Clinical Impression   Pt presents with minimal oropharyngeal dysphagia wiithout aspiration or penetration with any intake.   Swallow was overall  timely and strong.  Pt did not demonstate symptoms that she reports including coughing with intake or globus sensation.  Pt did have minimal delay in swallow reflex and mild pharyngeal residuals with solids without pt awareness.  Liquid swallow faciliates clearance.    Of note, pt did appear with lingual rolling to left upon phonation of "ah", ? source.  This occured with every opportunity x4 - ? consistent with chorea type movement.    Pt also appeared minimally dysarthric - ? source.  Xerostomia reported by pt and SLP provided written/verbal compensation strategies.  Using live video, educated pt to findings and use of compensation techniques.          No flowsheet data found.   CHL IP DIET RECOMMENDATION 12/11/2014  SLP Diet Recommendations Age appropriate regular solids;Thin  Liquid Administration via Cup, pt does not use straw  Medication Administration Whole meds with liquid  Compensations Small sips/bites;Slow rate;Follow solids with liquid, start meal with liquids  Postural Changes and/or Swallow Maneuvers (None)     CHL IP OTHER RECOMMENDATIONS 12/11/2014  Recommended Consults Other (Comment)  Oral Care Recommendations Oral care BID  Other Recommendations (None)         CHL IP REASON FOR REFERRAL 12/11/2014  Reason for Referral Objectively evaluate swallowing function         CHL IP PHARYNGEAL PHASE 12/11/2014  Pharyngeal Phase WFL  Pharyngeal - Pudding Teaspoon (None)  Penetration/Aspiration details (pudding teaspoon) (None)  Pharyngeal - Pudding Cup (None)  Penetration/Aspiration details (pudding cup) (None)  Pharyngeal - Honey Teaspoon (None)  Penetration/Aspiration details (honey teaspoon) (None)  Pharyngeal - Honey Cup (None)  Penetration/Aspiration details (honey cup) (None)  Pharyngeal - Honey Syringe (None)  Penetration/Aspiration details (honey syringe) (None)  Pharyngeal - Nectar Teaspoon (None)  Penetration/Aspiration details (nectar teaspoon) (None)  Pharyngeal -  Nectar Cup (None)  Penetration/Aspiration details (nectar cup) (None)  Pharyngeal - Nectar Straw (None)  Penetration/Aspiration details (nectar straw) (None)  Pharyngeal - Nectar Syringe (None)  Penetration/Aspiration details (nectar syringe) (None)  Pharyngeal - Ice Chips (None)  Penetration/Aspiration details (ice chips) (None)  Pharyngeal - Thin Teaspoon (None)  Penetration/Aspiration details (thin teaspoon) (None)  Pharyngeal - Thin Cup (None)  Penetration/Aspiration details (thin cup) (None)  Pharyngeal - Thin Straw (None)  Penetration/Aspiration details (thin straw) (None)  Pharyngeal - Thin Syringe (None)  Penetration/Aspiration details (thin syringe') (None)  Pharyngeal - Puree (None)  Penetration/Aspiration details (puree) (None)  Pharyngeal - Mechanical Soft (None)  Penetration/Aspiration details (mechanical soft) (None)  Pharyngeal - Regular (None)  Penetration/Aspiration details (regular) (None)  Pharyngeal - Multi-consistency (None)  Penetration/Aspiration details (multi-consistency) (None)  Pharyngeal - Pill (None)  Penetration/Aspiration details (pill) (None)  Pharyngeal Comment (None)      CHL IP CERVICAL ESOPHAGEAL PHASE 12/11/2014  Cervical Esophageal Phase WFL  Pudding Teaspoon (None)  Pudding Cup (None)  Honey Teaspoon (None)  Honey Cup (None)  Honey Straw (None)  Nectar Teaspoon (None)  Nectar Cup (None)  Nectar Straw (None)  Nectar Sippy Cup (None)  Thin Teaspoon (None)  Thin Cup (None)  Thin Straw (None)  Thin Sippy Cup (None)  Cervical Esophageal Comment (None)    CHL IP GO 12/11/2014  Functional Assessment Tool Used MBS, clinical judgement  Functional Limitations Swallowing  Swallow Current Status (Y3496) CI  Swallow Goal Status (L1643) CI  Swallow Discharge Status [phone]) [place], [place] [facility] SLP [phone]

## 2014-12-12 DIAGNOSIS — E782 Mixed hyperlipidemia: Secondary | ICD-10-CM | POA: Diagnosis not present

## 2014-12-12 DIAGNOSIS — I130 Hypertensive heart and chronic kidney disease with heart failure and stage 1 through stage 4 chronic kidney disease, or unspecified chronic kidney disease: Secondary | ICD-10-CM | POA: Diagnosis not present

## 2014-12-12 DIAGNOSIS — Z23 Encounter for immunization: Secondary | ICD-10-CM | POA: Diagnosis not present

## 2014-12-12 DIAGNOSIS — I5030 Unspecified diastolic (congestive) heart failure: Secondary | ICD-10-CM | POA: Diagnosis not present

## 2014-12-12 DIAGNOSIS — N182 Chronic kidney disease, stage 2 (mild): Secondary | ICD-10-CM | POA: Diagnosis not present

## 2014-12-12 DIAGNOSIS — I482 Chronic atrial fibrillation: Secondary | ICD-10-CM | POA: Diagnosis not present

## 2014-12-12 DIAGNOSIS — J449 Chronic obstructive pulmonary disease, unspecified: Secondary | ICD-10-CM | POA: Diagnosis not present

## 2014-12-16 ENCOUNTER — Encounter: Payer: Self-pay | Admitting: Cardiovascular Disease

## 2014-12-18 ENCOUNTER — Encounter: Payer: Self-pay | Admitting: Cardiovascular Disease

## 2014-12-18 ENCOUNTER — Ambulatory Visit (INDEPENDENT_AMBULATORY_CARE_PROVIDER_SITE_OTHER): Payer: Medicare Other | Admitting: Cardiovascular Disease

## 2014-12-18 VITALS — BP 120/58 | HR 71 | Ht 61.0 in | Wt 137.8 lb

## 2014-12-18 DIAGNOSIS — Z952 Presence of prosthetic heart valve: Secondary | ICD-10-CM

## 2014-12-18 DIAGNOSIS — I214 Non-ST elevation (NSTEMI) myocardial infarction: Secondary | ICD-10-CM

## 2014-12-18 DIAGNOSIS — I482 Chronic atrial fibrillation, unspecified: Secondary | ICD-10-CM

## 2014-12-18 DIAGNOSIS — Z954 Presence of other heart-valve replacement: Secondary | ICD-10-CM

## 2014-12-18 NOTE — Progress Notes (Signed)
Cardiology Office Note   Date:  She's been on Plavix and aspirin. 12/18/2014   ID:  Brittany [name], DOB [birth year], MRN [id number]  PCP:  Mayra Neer, MD  Cardiologist:   Thayer Headings, MD   Chief Complaint  Patient presents with  . Follow-up    atrial fib   1. Aortic valve replacement 2. Dyslipidemia 3. Hypertension 4. 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  5. Atrial fibrillation  History of Present Illness:  Brittany [name] is an 81 yo with aortic valve replacement, HTN, and hyperlipidemia. She has not had any cardiac complaints recently. She complains of easy bruising on her arms.   She had an episode of severe heart burn this past week.   She's had a stroke over the past year. This has left her with some balance issues and some loss of peripheral vision.  Nov. 4, 2014:  Brittany [name] is doing well from a cardiac standpoint. She is having problems with interstitial cystitis. She had a procedure last week and had a small bladder tear which they are letting heal. She is still having some significant pain.   Aug 27, 2013:  Brittany [name] is doing OK. She is not having any problems . Still very active.  She had an AVR   She is having problems with interstitial cyctitis.   November 06, 2013:  Brittany [name] was admitted to the hospital twice on July , 2015: DC summary :  81 year old Caucasian female with past medical history significant for hypertension, hyperlipidemia, history of CVA permanent atrial fibrillation on Xarelto for the last 2 years, and history of aortic valve replacement with bioprosthetic valve in 2005, here with chest pain. She was recently admitted to the hospital for management of malignant HTN , diastolic HF and headache. She was seen by Dr Haroldine Laws and [place] on 10/16/2013. She returns within 24 hrs for symptoms of chest pain . Pt states that earlier in the afternoon while she was trying to sleep post lunch she started experiencing soreness ,  heaviness "someone sitting on my chest" on the left side radiating to left arm with associated mild SOB. This improved with SL NTG , however, she continues to have some chest pain. Pt denied any orthopnea, PND , LE edema , syncope, claudication , palpitation etc . She reports medication compliance. She bruises easily in her skin and once episodes of epistaxis several months ago.  She was admitted. Xarelto was held in anticipation of coronary angiography. ASA, statin, BB, IV NTG and heparin added. She ruled in for NSTEMI with troponin of 0.63. Left heart cath revealed severe, fibrotic disease in the ostial right coronary artery which was the culprit lesion. This was successfully stented with a 2.5 x 14 resolute drug-eluting stent, postdilated to 3.3 mm in diameter. She was started on plavix, however, a P2Y12 came back at 289 so she was started on Brilinta. Cardizem cd 120 was added for afib RVR with improvement. Due to mild cough Lisinopril was changed to irbesartan. Xarelto was changed to coumadin and she will follow up two days after DC in the coumadin clinic   She now presents for follow up . She has some MSK pain ( hurts when she presses her chest. She has had some bleeding. She woke up in the morning with a small skin tear on her right ankle . There was lots of blood in the bed. The bleeding eventually stopped with pressure. She also has had a nose bleed.  She has not had any chest pain and her energy levels have been better. INR levels have been OK.   Oct. 29, 2015:  Brittany.  [name] feeling better. She had a bad viral illness last week and thinks that she might have the flu. She is getting better.  She's had some problems with anemia and she is turned in 3 sets of guaiac cards. She has had some dyspnea and was started on Lasix on a QOD schedule.  She is now off Brilinta. She had a major bleeding from he right foot. She was on brilinta because She had an inadequate response to  plavix originally but later we discovered that she was also on Omeprazole. We have stopped the omeprazone and her PRU is better. She is taking Nexium instead.    Off Eliquis. Is back on warfarin. doing well     June 20, 2014: : Brittany [name] is a 81 y.o. female who presents for follow up .  She has had prolonged nausea and abdominal pain.  Worse with eating. No cardiac symptoms .   Sept. 8, 2016:    Doing well from a cardiac issue.  has been diagnosed with COPD ( was a former smoker, 25 years ago )  Still has some stomach issues.   Is still weak but is gradually improving  Having intermittant nose bleeds - is on Plavix and couamding   Past Medical History  Diagnosis Date  . Dyslipidemia     takes Lipitor daily  . Arthritis   . Urine incontinence   . Diverticulosis   . IC (interstitial cystitis)   . S/P aortic valve replacement     2005  . H/O hiatal hernia   . History of TIA (transient ischemic attack)     2013-- RESIDUAL PERIPHERAL VISION RIGHT EYE--  RESOLVED  . History of bacterial endocarditis   . Foot drop     SINCE 1987  . History of gastritis   . Chronic back pain   . Hypertension   . Atrial fibrillation   . GERD (gastroesophageal reflux disease)   . Hearing impaired     Bilateral hearing aids  . History of stomach ulcers     many yrs ago  . Dysrhythmia     Atrial Fibrillation  . Pulmonary hypertension 10/2013    Based on TTE    Past Surgical History  Procedure Laterality Date  . Abdominal hysterectomy  1967  . Aortic valve replacement  09-29-2003     [facility] pericardial tissue valve  . Cholecystectomy  1993  . Vein ligation  1969    RIGHT LOWER LEG  . Dilation and curettage of uterus    . Ercp N/A 08/10/2012    Procedure: ENDOSCOPIC RETROGRADE CHOLANGIOPANCREATOGRAPHY (ERCP);  Surgeon: Inda Castle, MD;  Location: [place];  Service: Gastroenterology;  Laterality: N/A;  . Cataract extraction w/ intraocular lens  implant, bilateral     . Appendectomy  1933  . Cardiac catheterization  07-15-2003 DR HELEN PRESTON    MODERATE TO MODERATELY SEVERE CALCIFIC AORTIC STENOSIS/  NORMAL CORONARY ARTERIES  . Right shoulder arthroscopy w/ debridement rotator cuff and labral tear/ acrominoplasty/ distal clavicle excision/ ca ligament release  10-08-1999  . Cysto/ hydrodistention/ instillation clorpactin  MULTIPLE  last one 2009  . Mini-open right rotator cuff repair  07-12-2001  . Transvaginal tape procedure  07-30-2002  . Lumbar disc surgery  1985 &  2007  . Macroplastique urethral implantation  08-27-2009  .  Transthoracic echocardiogram  08-10-2011    MILD LVH/  EF 55-60%/  NORMAL AVR TISSUE/ MILD MR/  MODERATE DILATED LA/  MILD DILATED RA  . Cardiovascular stress test  01-30-2012  DR NASHER    NORMAL NUCLEAR STUDY/  EF 73%/  NORMAL LVF  . Tonsillectomy and adenoidectomy    . Carpal tunnel release Bilateral   . Cysto with hydrodistension N/A 02/05/2013    Procedure: CYSTOSCOPY/HYDRODISTENSION;  Surgeon: Irine Seal, MD;  Location: [facility];  Service: Urology;  Laterality: N/A;  . Left heart catheterization with coronary angiogram N/A 10/18/2013    Procedure: LEFT HEART CATHETERIZATION WITH CORONARY ANGIOGRAM;  Surgeon: Jettie Booze, MD;  Location: [facility] CATH LAB;  Service: Cardiovascular;  Laterality: N/A;  . Esophagogastroduodenoscopy (egd) with propofol N/A 08/18/2014    Procedure: ESOPHAGOGASTRODUODENOSCOPY (EGD) WITH PROPOFOL;  Surgeon: Inda Castle, MD;  Location: [facility];  Service: Endoscopy;  Laterality: N/A;     Current Outpatient Prescriptions  Medication Sig Dispense Refill  . atenolol (TENORMIN) 25 MG tablet TAKE 1 TABLET (25 MG TOTAL) BY MOUTH DAILY. (Patient taking differently: Take 1 tablet (25 mg total) by mouth daily.) 30 tablet 5  . atorvastatin (LIPITOR) 20 MG tablet Take 20 mg by mouth daily.    Marland Kitchen CARTIA XT 120 MG 24 hr capsule TAKE 1 TABLET (120 MG TOTAL) BY MOUTH DAILY. (Patient  taking differently: Take 1 tablet (120 mg total) by mouth twice daily) 30 capsule 5  . clopidogrel (PLAVIX) 75 MG tablet Take 1 tablet (75 mg total) by mouth daily. 30 tablet 11  . furosemide (LASIX) 40 MG tablet Take 40 mg by mouth daily as needed (edema).    Marland Kitchen HYDROcodone-acetaminophen (NORCO/VICODIN) 5-325 MG per tablet Take 1 tablet by mouth every 6 (six) hours as needed.    . hyoscyamine (LEVSIN, ANASPAZ) 0.125 MG tablet Take 0.125 mg by mouth every 4 (four) hours as needed.    Marland Kitchen lisinopril (PRINIVIL,ZESTRIL) 20 MG tablet Take 20 mg by mouth daily.    Marland Kitchen LORazepam (ATIVAN) 0.5 MG tablet Take 0.5 mg by mouth 2 (two) times daily as needed for anxiety (anxiety).    . nystatin (MYCOSTATIN) 100000 UNIT/ML suspension Take 5 mLs (500,000 Units total) by mouth 3 (three) times daily. 60 mL 1  . ondansetron (ZOFRAN) 4 MG tablet Take 4 mg by mouth 2 (two) times daily as needed for nausea or vomiting (nausea/vomiting).    Marland Kitchen oxybutynin (DITROPAN) 5 MG tablet Take 5 mg by mouth 2 (two) times daily.    . pantoprazole (PROTONIX) 40 MG tablet Take 1 tablet (40 mg total) by mouth daily. 90 tablet 3  . PROAIR HFA 108 (90 BASE) MCG/ACT inhaler 2 puffs every 4 (four) hours as needed.    . promethazine (PHENERGAN) 12.5 MG tablet Take 1 tablet (12.5 mg total) by mouth every 6 (six) hours as needed for nausea or vomiting. 30 tablet 0  . ranitidine (ZANTAC) 150 MG tablet Take 150 mg by mouth at bedtime.    Marland Kitchen warfarin (COUMADIN) 5 MG tablet Take by mouth daily. Take by mouth as directed by the coumadin clinic     No current facility-administered medications for this visit.    Allergies:   Amlodipine; Amoxicillin; Carafate; Cephalexin; Codeine; Irbesartan; Morphine and related; Neurontin; Nitrofurantoin monohyd macro; and Sulfa antibiotics    Social History:  The patient  reports that she quit smoking about 24 years ago. Her smoking use included Cigarettes. She started smoking about 65 years ago.  She has a 20  pack-year smoking history. She has never used smokeless tobacco. She reports that she does not drink alcohol or use illicit drugs.   Family History:  The patient's family history includes Alcohol abuse in her sister and sister; Bipolar disorder in her brother and son; Colon cancer in her maternal aunt; Coronary artery disease in her brother; Esophageal cancer in her maternal grandfather; Heart disease in her father; Stomach cancer in her maternal grandmother. There is no history of Lung disease.    ROS:  Please see the history of present illness.    Review of Systems: Constitutional:  admits to  , appetite change    HEENT: denies photophobia, eye pain, redness, hearing loss, ear pain, congestion, sore throat, rhinorrhea, sneezing, neck pain, neck stiffness and tinnitus.  Respiratory: admits to SOB, DOE with activity , cough, chest tightness, and wheezing.  Cardiovascular: denies chest pain, palpitations and leg swelling.  Gastrointestinal: admits to nausea, vomiting, abdominal pain,   Genitourinary: denies dysuria, urgency, frequency, hematuria, flank pain and difficulty urinating.  Musculoskeletal: admits to   back pain,     Skin: denies pallor, rash and wound.  Neurological: denies dizziness, seizures, syncope, weakness, light-headedness, numbness and headaches.   Hematological: admits to   easy bruising,   Psychiatric/ Behavioral: denies suicidal ideation, mood changes, confusion, nervousness, sleep disturbance and agitation.       All other systems are reviewed and negative.    PHYSICAL EXAM: VS:  BP 120/58 mmHg  Pulse 71  Ht 5\' 1"  (1.549 m)  Wt 62.506 kg (137 lb 12.8 oz)  BMI 26.05 kg/m2 , BMI Body mass index is 26.05 kg/(m^2). GEN: Well nourished, well developed, in no acute distress HEENT: normal Neck: no JVD, carotid bruits, or masses Cardiac: Irreg.. Irreg. ; no murmurs, rubs, or gallops,no edema  Respiratory:  clear to auscultation bilaterally, normal work of  breathing GI: soft, nontender, nondistended, + BS Brittany: no deformity or atrophy Skin: warm and dry, no rash Neuro:  Strength and sensation are intact Psych: normal   EKG:  EKG is ordered today. The ekg ordered today demonstrates atrial fibrillation with a heart rate of 52. She has no ST or T wave changes.   Recent Labs: 01/20/2014: Pro B Natriuretic peptide (BNP) 357.0* 08/27/2014: ALT 19; B Natriuretic Peptide 367.2* 10/31/2014: BUN 5*; Creatinine, Ser 0.61; Hemoglobin 11.0*; Platelets 311; Potassium 3.7; Sodium 128*    Lipid Panel    Component Value Date/Time   CHOL 159 10/12/2013 0525   TRIG 53 10/12/2013 0525   HDL 62 10/12/2013 0525   CHOLHDL 2.6 10/12/2013 0525   VLDL 11 10/12/2013 0525   LDLCALC 86 10/12/2013 0525      Wt Readings from Last 3 Encounters:  12/18/14 62.506 kg (137 lb 12.8 oz)  12/02/14 61.689 kg (136 lb)  10/16/14 63.504 kg (140 lb)      Other studies Reviewed: Additional studies/ records that were reviewed today include: records from medical doctor. Review of the above records demonstrates: several months of abdominal pain    ASSESSMENT AND PLAN:  1. Aortic valve replacement - Mrs. [name] is doing well. We'll continue the current medications. She's on Coumadin for her atrial fibrillation.  2. Dyslipidemia- continue current dose of atorvastatin.  3. Hypertension- blood pressure is well-controlled.  4. 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  5. Atrial fibrillation- very stable. Continue Coumadin. Her rate is slow but she is stable and not having any syncope.  6.  COPD:  Has a chronic cough .    7. CAD:   had a stent placed in July, 2015.  She's been having some nosebleeds and at this point I think that we can safely stop the Plavix. She'll continue with Coumadin I'll see her again in 6 month.  Current medicines are reviewed at length with the patient today.  The patient does not have concerns regarding medicines.  The following  changes have been made:  no change   Disposition:   FU with me in 6 months     Signed, Emilia Kayes, Wonda Cheng, MD  12/18/2014 4:40 PM    [facility] [address], [place], [place]  [address] Phone: [phone]; Fax: [phone]

## 2014-12-18 NOTE — Patient Instructions (Signed)
Medication Instructions:  STOP Plavix   Labwork: None Ordered   Testing/Procedures: None Ordered   Follow-Up: Your physician wants you to follow-up in: 6 months with Dr. Acie Fredrickson.  You will receive a reminder letter in the mail two months in advance. If you don't receive a letter, please call our office to schedule the follow-up appointment.

## 2014-12-24 ENCOUNTER — Ambulatory Visit (INDEPENDENT_AMBULATORY_CARE_PROVIDER_SITE_OTHER): Payer: Medicare Other | Admitting: *Deleted

## 2014-12-24 DIAGNOSIS — I482 Chronic atrial fibrillation, unspecified: Secondary | ICD-10-CM

## 2014-12-24 DIAGNOSIS — I214 Non-ST elevation (NSTEMI) myocardial infarction: Secondary | ICD-10-CM

## 2014-12-24 DIAGNOSIS — I480 Paroxysmal atrial fibrillation: Secondary | ICD-10-CM | POA: Diagnosis not present

## 2014-12-24 LAB — POCT INR: INR: 2.4

## 2014-12-26 ENCOUNTER — Ambulatory Visit (INDEPENDENT_AMBULATORY_CARE_PROVIDER_SITE_OTHER): Payer: Medicare Other | Admitting: Pulmonary Disease

## 2014-12-26 ENCOUNTER — Telehealth: Payer: Self-pay

## 2014-12-26 DIAGNOSIS — J432 Centrilobular emphysema: Secondary | ICD-10-CM

## 2014-12-26 DIAGNOSIS — R0600 Dyspnea, unspecified: Secondary | ICD-10-CM | POA: Diagnosis not present

## 2014-12-26 LAB — PULMONARY FUNCTION TEST
DL/VA % pred: 81 %
DL/VA: 3.59 ml/min/mmHg/L
DLCO unc % pred: 62 %
DLCO unc: 12.67 ml/min/mmHg
FEF 25-75 PRE: 0.71 L/s
FEF 25-75 Post: 1.3 L/sec
FEF2575-%CHANGE-POST: 81 %
FEF2575-%Pred-Post: 136 %
FEF2575-%Pred-Pre: 75 %
FEV1-%Change-Post: 5 %
FEV1-%PRED-POST: 107 %
FEV1-%Pred-Pre: 101 %
FEV1-POST: 1.57 L
FEV1-PRE: 1.49 L
FEV1FVC-%CHANGE-POST: 5 %
FEV1FVC-%Pred-Pre: 97 %
FEV6-%CHANGE-POST: 0 %
FEV6-%PRED-PRE: 112 %
FEV6-%Pred-Post: 111 %
FEV6-POST: 2.08 L
FEV6-PRE: 2.08 L
FEV6FVC-%Change-Post: 0 %
FEV6FVC-%PRED-POST: 106 %
FEV6FVC-%PRED-PRE: 107 %
FVC-%CHANGE-POST: 0 %
FVC-%PRED-PRE: 104 %
FVC-%Pred-Post: 105 %
FVC-POST: 2.1 L
FVC-Pre: 2.09 L
POST FEV6/FVC RATIO: 99 %
Post FEV1/FVC ratio: 75 %
Pre FEV1/FVC ratio: 71 %
Pre FEV6/FVC Ratio: 100 %
RV % PRED: 65 %
RV: 1.55 L
TLC % PRED: 80 %
TLC: 3.69 L

## 2014-12-26 NOTE — Telephone Encounter (Signed)
Spoke with Mindy, Dr, Nestor's nurse. Schedule pt for the next available and we will call her with the results of her CT. Pt is aware of this information. Nothing further was needed.

## 2014-12-26 NOTE — Progress Notes (Signed)
PFT done today. 

## 2014-12-26 NOTE — Progress Notes (Signed)
PFT 12/26/14:  FVC 2.09 L (104%) FEV1 1.49 L (101%) FEV1/FVC 0.71 FEF 25-75 0.71 L (75%) no bronchodilator response TLC 3.69 L (80%) RV 65% DLCO uncorrected 62%  6MWT 12/26/14:  Walked 180 meters / Baseline Sat 99% on RA / Nadir Sat 99% on RA (c/o hip/leg/calf pain)

## 2014-12-30 ENCOUNTER — Ambulatory Visit: Payer: Medicare Other | Admitting: Pulmonary Disease

## 2014-12-31 ENCOUNTER — Ambulatory Visit (INDEPENDENT_AMBULATORY_CARE_PROVIDER_SITE_OTHER)
Admission: RE | Admit: 2014-12-31 | Discharge: 2014-12-31 | Disposition: A | Discharge status: Home / Self Care | Payer: Medicare Other | Source: Ambulatory Visit | Attending: Pulmonary Disease | Admitting: Pulmonary Disease

## 2014-12-31 DIAGNOSIS — R918 Other nonspecific abnormal finding of lung field: Secondary | ICD-10-CM | POA: Diagnosis not present

## 2015-01-01 ENCOUNTER — Telehealth: Payer: Self-pay | Admitting: Pulmonary Disease

## 2015-01-01 NOTE — Telephone Encounter (Signed)
Spoke with patient regarding CT results. Plan for repeat CT chest w/o to monitor mediastinal LAD in 6 months or sooner if symptoms arise.

## 2015-01-14 ENCOUNTER — Other Ambulatory Visit: Payer: Self-pay | Admitting: Physician Assistant

## 2015-01-14 ENCOUNTER — Ambulatory Visit (INDEPENDENT_AMBULATORY_CARE_PROVIDER_SITE_OTHER): Payer: Medicare Other | Admitting: *Deleted

## 2015-01-14 DIAGNOSIS — I214 Non-ST elevation (NSTEMI) myocardial infarction: Secondary | ICD-10-CM

## 2015-01-14 DIAGNOSIS — I480 Paroxysmal atrial fibrillation: Secondary | ICD-10-CM

## 2015-01-14 DIAGNOSIS — I482 Chronic atrial fibrillation, unspecified: Secondary | ICD-10-CM

## 2015-01-14 LAB — POCT INR: INR: 2

## 2015-01-14 NOTE — Telephone Encounter (Signed)
This patient saw Nicoletta Ba June of 2016.  Asking for refill on Ativan.

## 2015-01-14 NOTE — Telephone Encounter (Signed)
Yes please

## 2015-01-15 DIAGNOSIS — H53461 Homonymous bilateral field defects, right side: Secondary | ICD-10-CM | POA: Diagnosis not present

## 2015-01-15 DIAGNOSIS — H40013 Open angle with borderline findings, low risk, bilateral: Secondary | ICD-10-CM | POA: Diagnosis not present

## 2015-01-28 ENCOUNTER — Ambulatory Visit: Payer: Medicare Other | Admitting: Pulmonary Disease

## 2015-01-29 ENCOUNTER — Other Ambulatory Visit: Payer: Medicare Other

## 2015-01-29 ENCOUNTER — Encounter: Payer: Self-pay | Admitting: Pulmonary Disease

## 2015-01-29 ENCOUNTER — Ambulatory Visit (INDEPENDENT_AMBULATORY_CARE_PROVIDER_SITE_OTHER): Payer: Medicare Other | Admitting: Pulmonary Disease

## 2015-01-29 VITALS — BP 120/62 | HR 63 | Ht 60.0 in | Wt 141.6 lb

## 2015-01-29 DIAGNOSIS — R599 Enlarged lymph nodes, unspecified: Secondary | ICD-10-CM | POA: Diagnosis not present

## 2015-01-29 DIAGNOSIS — I214 Non-ST elevation (NSTEMI) myocardial infarction: Secondary | ICD-10-CM

## 2015-01-29 DIAGNOSIS — K219 Gastro-esophageal reflux disease without esophagitis: Secondary | ICD-10-CM

## 2015-01-29 DIAGNOSIS — R590 Localized enlarged lymph nodes: Secondary | ICD-10-CM

## 2015-01-29 DIAGNOSIS — I272 Other secondary pulmonary hypertension: Secondary | ICD-10-CM | POA: Diagnosis not present

## 2015-01-29 DIAGNOSIS — J432 Centrilobular emphysema: Secondary | ICD-10-CM | POA: Diagnosis not present

## 2015-01-29 NOTE — Patient Instructions (Signed)
1. Do not forget to call your ENT doctor to schedule appointment to address your sinus headaches & congestion. 2. We are going to repeat a CT scan of your chest in March 2017 to follow-up on the enlarged lymph node around [place]. 3. I'm starting you on Incruse Ellipta to help your breathing & cough. Use 1 inhalation daily. Stop using immediately if you have any trouble urinating or developed any new blurry vision and get checked out. 4. Please contact my office if you have any new breathing problems before your next appointment. 5. I will see back in 1-2 months.

## 2015-01-29 NOTE — Progress Notes (Signed)
Subjective:    Patient ID: Brittany [name], female    DOB: 1927-05-09, 81 y.o.   MRN: [id number]  C.C.:  Follow-up for Emphysema, Lung Nodules, Mediastinal Adenopathy, Pulmonary Hypertension, & GERD.  HPI Emphysema: Alpha-1 antitrypsin ordered at last appointment. No evidence for COPD on spirometry. Breo was stopped at last appointment. She reports she continues to have a cough for months and reports it is only occasionally productive of 1/2 tsp of clear mucus. No wheezing. She does feel her dyspnea is worsening, especially over the last week. She has been using ProAir intermittently and before it was helping more.  Lung Nodules: Resolved on repeat CT imaging in September.   Mediastinal Adenopathy: Largest lymph node is precarinal and measures 1.2 cm. Findings on CT discussed with the patient last month. Plan for repeat CT imaging in 6 months. No adenopathy in her neck, groin, or axilla.   Pulmonary Hypertension: Likely secondary to left ventricular dysfunction & prior aortic valve disease. This is further confounded by underlying emphysema. She denies any lower extremity edema. No syncope or near syncope. Not currently on any pulmonary arterial vasodilator therapy.  GERD: Patient prescribed Protonix & Zantac. Reported dysphagia/odynophagia last appointment. She reports her dysphgia has totally resolved. Swallowing evaluation was normal & she had counseling regarding technique from the speech therapist. She reports waking up with reflux once every 3 weeks or so.  Review of Systems She reports she has sinus congestion & pressure. She has been taking Mucinex & nasal saline rinse. She reports a daily frontal sinus headache. She does report intermittent epistaxis in the left. She has clear sinus drainage recently. No fever, chills, or sweats. No chest pain or pressure.  Allergies  Allergen Reactions  . Amlodipine     Other reaction(s): edema  . Amoxicillin Diarrhea and Nausea And Vomiting  .  Carafate [Sucralfate] Swelling    Swelling in knees and blocky red marks on them.  . Cephalexin Diarrhea  . Codeine Nausea Only  . Irbesartan Swelling    Facial and hand numbness and swelling. Patient believes reaction was with this medication but isn't 100% sure. She is currently tolerating lisinopril.  Marland Kitchen Morphine And Related Other (See Comments)    HX ADDICTION / WITHDRAWAL  . Neurontin [Gabapentin] Swelling    Legs swell  . Nitrofurantoin Monohyd Macro Diarrhea and Nausea Only  . Sulfa Antibiotics Rash   Current Outpatient Prescriptions on File Prior to Visit  Medication Sig Dispense Refill  . atenolol (TENORMIN) 25 MG tablet TAKE 1 TABLET (25 MG TOTAL) BY MOUTH DAILY. (Patient taking differently: Take 1 tablet (25 mg total) by mouth daily.) 30 tablet 5  . atorvastatin (LIPITOR) 20 MG tablet Take 20 mg by mouth daily.    Marland Kitchen CARTIA XT 120 MG 24 hr capsule TAKE 1 TABLET (120 MG TOTAL) BY MOUTH DAILY. (Patient taking differently: Take 1 tablet (120 mg total) by mouth once daily) 30 capsule 5  . HYDROcodone-acetaminophen (NORCO/VICODIN) 5-325 MG per tablet Take 1 tablet by mouth every 6 (six) hours as needed.    . hyoscyamine (LEVSIN, ANASPAZ) 0.125 MG tablet Take 0.125 mg by mouth every 4 (four) hours as needed.    Marland Kitchen lisinopril (PRINIVIL,ZESTRIL) 20 MG tablet Take 20 mg by mouth daily.    Marland Kitchen LORazepam (ATIVAN) 0.5 MG tablet Take 0.5 mg by mouth 2 (two) times daily as needed for anxiety (anxiety).    . nystatin (MYCOSTATIN) 100000 UNIT/ML suspension Take 5 mLs (500,000 Units total) by mouth 3 (  three) times daily. (Patient taking differently: Take 5 mLs by mouth 3 (three) times daily as needed. ) 60 mL 1  . ondansetron (ZOFRAN) 4 MG tablet Take 4 mg by mouth 2 (two) times daily as needed for nausea or vomiting (nausea/vomiting).    Marland Kitchen oxybutynin (DITROPAN) 5 MG tablet Take 5 mg by mouth 2 (two) times daily.    . pantoprazole (PROTONIX) 40 MG tablet Take 1 tablet (40 mg total) by mouth daily. 90  tablet 3  . PROAIR HFA 108 (90 BASE) MCG/ACT inhaler 2 puffs every 4 (four) hours as needed.    . promethazine (PHENERGAN) 12.5 MG tablet Take 1 tablet (12.5 mg total) by mouth every 6 (six) hours as needed for nausea or vomiting. 30 tablet 0  . ranitidine (ZANTAC) 150 MG tablet Take 150 mg by mouth at bedtime.    Marland Kitchen warfarin (COUMADIN) 5 MG tablet Take by mouth daily. Take by mouth as directed by the coumadin clinic     No current facility-administered medications on file prior to visit.   Past Medical History  Diagnosis Date  . Dyslipidemia     takes Lipitor daily  . Arthritis   . Urine incontinence   . Diverticulosis   . IC (interstitial cystitis)   . S/P aortic valve replacement     2005  . H/O hiatal hernia   . History of TIA (transient ischemic attack)     2013-- RESIDUAL PERIPHERAL VISION RIGHT EYE--  RESOLVED  . History of bacterial endocarditis   . Foot drop     SINCE 1987  . History of gastritis   . Chronic back pain   . Hypertension   . Atrial fibrillation ([place])   . GERD (gastroesophageal reflux disease)   . Hearing impaired     Bilateral hearing aids  . History of stomach ulcers     many yrs ago  . Dysrhythmia     Atrial Fibrillation  . Pulmonary hypertension ([place]) 10/2013    Based on TTE   Past Surgical History  Procedure Laterality Date  . Abdominal hysterectomy  1967  . Aortic valve replacement  09-29-2003     [facility] pericardial tissue valve  . Cholecystectomy  1993  . Vein ligation  1969    RIGHT LOWER LEG  . Dilation and curettage of uterus    . Ercp N/A 08/10/2012    Procedure: ENDOSCOPIC RETROGRADE CHOLANGIOPANCREATOGRAPHY (ERCP);  Surgeon: Inda Castle, MD;  Location: [facility];  Service: Gastroenterology;  Laterality: N/A;  . Cataract extraction w/ intraocular lens  implant, bilateral    . Appendectomy  1933  . Cardiac catheterization  07-15-2003 DR HELEN PRESTON    MODERATE TO MODERATELY SEVERE CALCIFIC AORTIC STENOSIS/  NORMAL  CORONARY ARTERIES  . Right shoulder arthroscopy w/ debridement rotator cuff and labral tear/ acrominoplasty/ distal clavicle excision/ ca ligament release  10-08-1999  . Cysto/ hydrodistention/ instillation clorpactin  MULTIPLE  last one 2009  . Mini-open right rotator cuff repair  07-12-2001  . Transvaginal tape procedure  07-30-2002  . Lumbar disc surgery  1985 &  2007  . Macroplastique urethral implantation  08-27-2009  . Transthoracic echocardiogram  08-10-2011    MILD LVH/  EF 55-60%/  NORMAL AVR TISSUE/ MILD MR/  MODERATE DILATED LA/  MILD DILATED RA  . Cardiovascular stress test  01-30-2012  DR NASHER    NORMAL NUCLEAR STUDY/  EF 73%/  NORMAL LVF  . Tonsillectomy and adenoidectomy    . Carpal tunnel  release Bilateral   . Cysto with hydrodistension N/A 02/05/2013    Procedure: CYSTOSCOPY/HYDRODISTENSION;  Surgeon: Irine Seal, MD;  Location[facility];  Service: Urology;  Laterality: N/A;  . Left heart catheterization with coronary angiogram N/A 10/18/2013    Procedure: LEFT HEART CATHETERIZATION WITH CORONARY ANGIOGRAM;  Surgeon: Jettie Booze, MD;  Location: [facility] CATH LAB;  Service: Cardiovascular;  Laterality: N/A;  . Esophagogastroduodenoscopy (egd) with propofol N/A 08/18/2014    Procedure: ESOPHAGOGASTRODUODENOSCOPY (EGD) WITH PROPOFOL;  Surgeon: Inda Castle, MD;  Location: [facility];  Service: Endoscopy;  Laterality: N/A;   Family History  Problem Relation Age of Onset  . Heart disease Father   . Coronary artery disease Brother   . Bipolar disorder Brother     commited suiside at 56yo  . Alcohol abuse Sister     sister #1  . Alcohol abuse Sister     sister #2  . Colon cancer Maternal Aunt   . Stomach cancer Maternal Grandmother   . Lung disease Neg Hx   . Esophageal cancer Maternal Grandfather   . Bipolar disorder Son     Committed Suicide   Social History   Social History  . Marital Status: Widowed    Spouse Name: N/A  . Number of  Children: 4  . Years of Education: N/A   Occupational History  . retired    Social History Main Topics  . Smoking status: Former Smoker -- 0.50 packs/day for 40 years    Types: Cigarettes    Start date: 05/06/1949    Quit date: 03/06/1990  . Smokeless tobacco: Never Used  . Alcohol Use: No     Comment: Remote social EtOH  . Drug Use: No  . Sexual Activity: No   Other Topics Concern  . None   Social History Narrative   She is originally from [place], [place]. She has previously lived in [place] for 10 years. She has traveled to all 52 states as well as every [place] in [place]. Prior travel to [place], [place], [place] [employer]. Previously worked as an Optometrist. Remote exposure to a parakeet for 2 years in 25s. No known asbestos, mold, or hot tub exposure. She enjoys doing needle point & hand crafts.        Objective:   Physical Exam Blood pressure 120/62, pulse 63, height 5' (1.524 m), weight 141 lb 9.6 oz (64.229 kg), SpO2 98 %.  General:  Elderly Caucasian female. No distress. Appears comfortable sitting in chair with walker beside the chair.  Integument:  Warm & dry. No rash on exposed skin.  HEENT:  Tacky mucous membranes. No oral ulcers. No nasal turbinate swelling. Normal left tympanic membrane. Cardiovascular: Regular rate. Trace bilateral lower extremity edema.  Pulmonary: Clear bilaterally to auscultation.normal work of breathing on room air. Abdomen: Soft. Normal bowel sounds. Nondistended. Grossly nontender. Musculoskeletal:  kyphosis present. Normal muscle bulk otherwise. No appreciated joint deformity.  Lymphatics:  No appreciated cervical or supracavicular LAD.  PFT 12/26/14: FVC 2.09 L (104%) FEV1 1.49 L (101%) FEV1/FVC 0.71 FEF 25-75 0.71 L (75%) no bronchodilator response TLC 3.69 L (80%) RV 65% DLCO uncorrected 62%  6MWT 12/26/14: Walked 180 meters / Baseline Sat 99% on RA / Nadir Sat 99% on RA (c/o hip/leg/calf pain)  IMAGING CT CHEST W/O 12/31/14 (personally reviewed by  me): Left lower lobe density resolved. Mild cardiomegaly without pericardial effusion. No pleural effusion or thickening. Apical predominant centrilobular emphysema. Precarinal lymph node measuring 1.2 cm in short axis.  SWALLOW EVALUATION (12/11/14): No evidence for aspiration..  CXR PA/LAT 10/31/14 (previously reviewed by me): Pronounced curvature of the thoracic spine. Cardiomegaly noted. No nodule or opacity appreciated. No pleural effusion. Mediastinum otherwise normal in contour.  CT CHEST W/ 10/07/14 (previously reviewed by me): No pleural effusion or thickening. Borderline precarinal lymph node measuring 1 cm in short axis. Upper lobe predominant centrilobular emphysema with some cystic changes. 8 mm & 9 mm nodules within the left costophrenic sulcus with some spiculation. No other opacity appreciated.  CARDIAC TTE (10/12/13): LV normal in size with moderate LVH. EF 60-65%. Adequate for wall motion assessment. Grade 2 diastolic dysfunction. LA & RA severely dilated. RV poorly visualized but appears mild to moderately enlarged with "likely normal function". Pulmonary artery systolic pressure 41 mmHg. IVC dilated. No pericardial effusion. No aortic stenosis or regurgitation. Mild mitral regurgitation without stenosis. Mild pulmonic and tricuspid valve regurgitation without stenosis.  LABS 11/12/14 INR: 3.1  10/31/14 CBC: 9.6/1.0/33.6/311 BMP: 128/3.11/04/22/5/0.61/125/8.9    Assessment & Plan:  81 year old female with underlying apical predominant emphysema. I suspect the emphysema is contributing to the patient's cough as well as dyspnea. Certainly her sinus congestion and drainage are not helping. Her reflux seems to be reasonably well-controlled and with the help of speech therapy for dysphagia has resolved. We again reviewed the results of her prior chest CT imaging and discussed the enlarged precarinal lymph node. She has no signs of decompensated congestive heart failure today. I instructed the  patient to remove for relief she developed any new breathing problems before her next appointment.   1. Centrilobular emphysema: Checking alpha-1 antitrypsin today. Patient given sample of Incruze Ellipta and instructed on proper technique by nursing staff. Planned repeat spirometry with bronchodilator challenge at next appointment. Patient will continue to use her albuterol inhaler as needed. 2. Mediastinal adenopathy: Plan for repeat CT scan of the chest in March 2017 without contrast. This will be ordered after next appointment. 3. GERD: Seemingly well controlled on Protonix & Zantac. No changes. 4. Pulmonary hypertension: Multifactorial. No signs of volume overload. Continuing to hold on pulmonary arterial vasodilator therapy. 5. Sinus congestion/headaches/chronic sinusitis: Previously evaluated by ENT. Recommended continue use of Mucinex & nasal saline rinse. Patient to contact ENT for a follow-up appointment for further treatment and recommendations. 6. Health maintenance: Patient reports she did receive the influenza vaccine this fall. 7. Follow-up: Patient to return to clinic 1-2 months or sooner if needed.

## 2015-02-02 LAB — ALPHA-1-ANTITRYPSIN: A-1 Antitrypsin, Ser: 176 mg/dL (ref 83–199)

## 2015-02-04 LAB — ALPHA-1 ANTITRYPSIN PHENOTYPE: A1 ANTITRYPSIN: 170 mg/dL (ref 83–199)

## 2015-02-11 ENCOUNTER — Ambulatory Visit (INDEPENDENT_AMBULATORY_CARE_PROVIDER_SITE_OTHER): Payer: Medicare Other | Admitting: Pharmacist

## 2015-02-11 DIAGNOSIS — R05 Cough: Secondary | ICD-10-CM | POA: Diagnosis not present

## 2015-02-11 DIAGNOSIS — J300 Vasomotor rhinitis: Secondary | ICD-10-CM | POA: Diagnosis not present

## 2015-02-11 DIAGNOSIS — I482 Chronic atrial fibrillation, unspecified: Secondary | ICD-10-CM

## 2015-02-11 DIAGNOSIS — I480 Paroxysmal atrial fibrillation: Secondary | ICD-10-CM | POA: Diagnosis not present

## 2015-02-11 DIAGNOSIS — I214 Non-ST elevation (NSTEMI) myocardial infarction: Secondary | ICD-10-CM | POA: Diagnosis not present

## 2015-02-11 LAB — POCT INR: INR: 2.1

## 2015-02-13 DIAGNOSIS — N3011 Interstitial cystitis (chronic) with hematuria: Secondary | ICD-10-CM | POA: Diagnosis not present

## 2015-02-13 DIAGNOSIS — R3121 Asymptomatic microscopic hematuria: Secondary | ICD-10-CM | POA: Diagnosis not present

## 2015-02-13 DIAGNOSIS — R351 Nocturia: Secondary | ICD-10-CM | POA: Diagnosis not present

## 2015-02-13 DIAGNOSIS — N3010 Interstitial cystitis (chronic) without hematuria: Secondary | ICD-10-CM | POA: Diagnosis not present

## 2015-02-13 DIAGNOSIS — R3129 Other microscopic hematuria: Secondary | ICD-10-CM | POA: Diagnosis not present

## 2015-02-23 DIAGNOSIS — M778 Other enthesopathies, not elsewhere classified: Secondary | ICD-10-CM | POA: Diagnosis not present

## 2015-02-25 ENCOUNTER — Other Ambulatory Visit: Payer: Self-pay | Admitting: Cardiovascular Disease

## 2015-02-26 DIAGNOSIS — L218 Other seborrheic dermatitis: Secondary | ICD-10-CM | POA: Diagnosis not present

## 2015-03-03 DIAGNOSIS — M545 Low back pain: Secondary | ICD-10-CM | POA: Diagnosis not present

## 2015-03-03 DIAGNOSIS — M542 Cervicalgia: Secondary | ICD-10-CM | POA: Diagnosis not present

## 2015-03-03 DIAGNOSIS — M4692 Unspecified inflammatory spondylopathy, cervical region: Secondary | ICD-10-CM | POA: Diagnosis not present

## 2015-03-03 DIAGNOSIS — M5416 Radiculopathy, lumbar region: Secondary | ICD-10-CM | POA: Diagnosis not present

## 2015-03-19 DIAGNOSIS — N182 Chronic kidney disease, stage 2 (mild): Secondary | ICD-10-CM | POA: Diagnosis not present

## 2015-03-19 DIAGNOSIS — I130 Hypertensive heart and chronic kidney disease with heart failure and stage 1 through stage 4 chronic kidney disease, or unspecified chronic kidney disease: Secondary | ICD-10-CM | POA: Diagnosis not present

## 2015-03-25 ENCOUNTER — Other Ambulatory Visit: Payer: Self-pay | Admitting: Pulmonary Disease

## 2015-03-25 ENCOUNTER — Ambulatory Visit (INDEPENDENT_AMBULATORY_CARE_PROVIDER_SITE_OTHER): Payer: Medicare Other | Admitting: *Deleted

## 2015-03-25 DIAGNOSIS — I214 Non-ST elevation (NSTEMI) myocardial infarction: Secondary | ICD-10-CM

## 2015-03-25 DIAGNOSIS — I482 Chronic atrial fibrillation, unspecified: Secondary | ICD-10-CM

## 2015-03-25 DIAGNOSIS — I480 Paroxysmal atrial fibrillation: Secondary | ICD-10-CM | POA: Diagnosis not present

## 2015-03-25 DIAGNOSIS — I130 Hypertensive heart and chronic kidney disease with heart failure and stage 1 through stage 4 chronic kidney disease, or unspecified chronic kidney disease: Secondary | ICD-10-CM | POA: Diagnosis not present

## 2015-03-25 DIAGNOSIS — R0600 Dyspnea, unspecified: Secondary | ICD-10-CM

## 2015-03-25 LAB — POCT INR: INR: 2.1

## 2015-03-26 ENCOUNTER — Ambulatory Visit (INDEPENDENT_AMBULATORY_CARE_PROVIDER_SITE_OTHER): Payer: Medicare Other | Admitting: Pulmonary Disease

## 2015-03-26 ENCOUNTER — Encounter: Payer: Self-pay | Admitting: Pulmonary Disease

## 2015-03-26 VITALS — BP 120/70 | HR 60 | Ht 61.0 in | Wt 138.0 lb

## 2015-03-26 DIAGNOSIS — R05 Cough: Secondary | ICD-10-CM | POA: Diagnosis not present

## 2015-03-26 DIAGNOSIS — I214 Non-ST elevation (NSTEMI) myocardial infarction: Secondary | ICD-10-CM | POA: Diagnosis not present

## 2015-03-26 DIAGNOSIS — R590 Localized enlarged lymph nodes: Secondary | ICD-10-CM

## 2015-03-26 DIAGNOSIS — R599 Enlarged lymph nodes, unspecified: Secondary | ICD-10-CM

## 2015-03-26 DIAGNOSIS — R059 Cough, unspecified: Secondary | ICD-10-CM

## 2015-03-26 DIAGNOSIS — K219 Gastro-esophageal reflux disease without esophagitis: Secondary | ICD-10-CM

## 2015-03-26 DIAGNOSIS — J432 Centrilobular emphysema: Secondary | ICD-10-CM

## 2015-03-26 DIAGNOSIS — I272 Other secondary pulmonary hypertension: Secondary | ICD-10-CM

## 2015-03-26 DIAGNOSIS — R0600 Dyspnea, unspecified: Secondary | ICD-10-CM | POA: Diagnosis not present

## 2015-03-26 LAB — PULMONARY FUNCTION TEST
FEF 25-75 PRE: 1 L/s
FEF 25-75 Post: 1.23 L/sec
FEF2575-%CHANGE-POST: 22 %
FEF2575-%PRED-PRE: 107 %
FEF2575-%Pred-Post: 132 %
FEV1-%Change-Post: 4 %
FEV1-%PRED-POST: 109 %
FEV1-%PRED-PRE: 105 %
FEV1-POST: 1.59 L
FEV1-PRE: 1.52 L
FEV1FVC-%CHANGE-POST: 7 %
FEV1FVC-%Pred-Pre: 97 %
FEV6-%CHANGE-POST: 0 %
FEV6-%PRED-POST: 113 %
FEV6-%Pred-Pre: 113 %
FEV6-POST: 2.1 L
FEV6-Pre: 2.1 L
FEV6FVC-%CHANGE-POST: 0 %
FEV6FVC-%PRED-PRE: 106 %
FEV6FVC-%Pred-Post: 107 %
FVC-%CHANGE-POST: -2 %
FVC-%Pred-Post: 105 %
FVC-%Pred-Pre: 107 %
FVC-Post: 2.1 L
FVC-Pre: 2.15 L
POST FEV6/FVC RATIO: 100 %
PRE FEV1/FVC RATIO: 71 %
Post FEV1/FVC ratio: 76 %
Pre FEV6/FVC Ratio: 99 %

## 2015-03-26 NOTE — Progress Notes (Signed)
Subjective:    Patient ID: Brittany [name], female    DOB: 11-09-1927, 81 y.o.   MRN: [id number]  C.C.:  Follow-up for Centrilobular Emphysema, Mediastinal Adenopathy, Pulmonary Hypertension, & GERD.  HPI Centrilobular Emphysema: Given sample of Incruse Ellipta to try at last appointment. She doesn't recall any improvement in her breathing on the inhaler. She continues to have a cough that is productive of a clear mucus. She reports there is only a very minimal amount. She was taking OTC Mucinex & an Anti-histamine with a decngestant that seemed to help. No wheezing. Continues to take Lisinopril. Cough occurs at no specific times. No nocturnal awakenings coughing.  Mediastinal Adenopathy: Largest lymph node is precarinal and measures 1.2 cm. Repeat CT imaging March 2017. No adenopathy in her neck, groin, or axilla.  Pulmonary Hypertension: Likely secondary to left ventricular dysfunction & prior aortic valve disease as well as emphysema. Denies any lower extremity edema. Not on diuretic therapy.   GERD: Currently prescribed Protonix & Zantac & reportedly compliant. She does clear her throat frequently. She reports reflux only once every 2-3 weeks. No morning brash water taste. No dysphagia or odynophagia.   Review of Systems She has noticed chest pressure with her uncontrolled hypertension recently. She has noticed a significant improvement in her chest pressure with control of her BP. Physician increased her Atenolol & Cartia XT doses. No fever, chills, or sweats. She does have some sinus congestion & post-nasal drainage.  Allergies  Allergen Reactions  . Amlodipine     Other reaction(s): edema  . Amoxicillin Diarrhea and Nausea And Vomiting  . Carafate [Sucralfate] Swelling    Swelling in knees and blocky red marks on them.  . Cephalexin Diarrhea  . Codeine Nausea Only  . Irbesartan Swelling    Facial and hand numbness and swelling. Patient believes reaction was with this medication  but isn't 100% sure. She is currently tolerating lisinopril.  Marland Kitchen Morphine And Related Other (See Comments)    HX ADDICTION / WITHDRAWAL  . Neurontin [Gabapentin] Swelling    Legs swell  . Nitrofurantoin Monohyd Macro Diarrhea and Nausea Only  . Sulfa Antibiotics Rash   Current Outpatient Prescriptions on File Prior to Visit  Medication Sig Dispense Refill  . atenolol (TENORMIN) 25 MG tablet TAKE 1 TABLET (25 MG TOTAL) BY MOUTH DAILY. (Patient taking differently: Take 1 tablet Twice a day) 30 tablet 3  . atorvastatin (LIPITOR) 20 MG tablet Take 20 mg by mouth daily.    Marland Kitchen CARTIA XT 120 MG 24 hr capsule TAKE 1 CAPSULE (120 MG TOTAL) BY MOUTH DAILY. (Patient taking differently: Takes 1 CAPSULE Twice a day) 30 capsule 3  . HYDROcodone-acetaminophen (NORCO/VICODIN) 5-325 MG per tablet Take 1 tablet by mouth every 6 (six) hours as needed.    . hyoscyamine (LEVSIN, ANASPAZ) 0.125 MG tablet Take 0.125 mg by mouth every 4 (four) hours as needed.    Marland Kitchen lisinopril (PRINIVIL,ZESTRIL) 20 MG tablet Take 20 mg by mouth daily.    Marland Kitchen LORazepam (ATIVAN) 0.5 MG tablet Take 0.5 mg by mouth 2 (two) times daily as needed for anxiety (anxiety).    . nystatin (MYCOSTATIN) 100000 UNIT/ML suspension Take 5 mLs (500,000 Units total) by mouth 3 (three) times daily. (Patient taking differently: Take 5 mLs by mouth 3 (three) times daily as needed. ) 60 mL 1  . ondansetron (ZOFRAN) 4 MG tablet Take 4 mg by mouth 2 (two) times daily as needed for nausea or vomiting (nausea/vomiting).    Marland Kitchen  oxybutynin (DITROPAN) 5 MG tablet Take 5 mg by mouth 2 (two) times daily.    . pantoprazole (PROTONIX) 40 MG tablet Take 1 tablet (40 mg total) by mouth daily. 90 tablet 3  . PROAIR HFA 108 (90 BASE) MCG/ACT inhaler 2 puffs every 4 (four) hours as needed.    . promethazine (PHENERGAN) 12.5 MG tablet Take 1 tablet (12.5 mg total) by mouth every 6 (six) hours as needed for nausea or vomiting. 30 tablet 0  . ranitidine (ZANTAC) 150 MG tablet Take  150 mg by mouth at bedtime.    Marland Kitchen warfarin (COUMADIN) 5 MG tablet Take by mouth daily. Take by mouth as directed by the coumadin clinic     No current facility-administered medications on file prior to visit.   Past Medical History  Diagnosis Date  . Dyslipidemia     takes Lipitor daily  . Arthritis   . Urine incontinence   . Diverticulosis   . IC (interstitial cystitis)   . S/P aortic valve replacement     2005  . H/O hiatal hernia   . History of TIA (transient ischemic attack)     2013-- RESIDUAL PERIPHERAL VISION RIGHT EYE--  RESOLVED  . History of bacterial endocarditis   . Foot drop     SINCE 1987  . History of gastritis   . Chronic back pain   . Hypertension   . Atrial fibrillation ([place])   . GERD (gastroesophageal reflux disease)   . Hearing impaired     Bilateral hearing aids  . History of stomach ulcers     many yrs ago  . Dysrhythmia     Atrial Fibrillation  . Pulmonary hypertension ([place]) 10/2013    Based on TTE   Past Surgical History  Procedure Laterality Date  . Abdominal hysterectomy  1967  . Aortic valve replacement  09-29-2003     [facility] pericardial tissue valve  . Cholecystectomy  1993  . Vein ligation  1969    RIGHT LOWER LEG  . Dilation and curettage of uterus    . Ercp N/A 08/10/2012    Procedure: ENDOSCOPIC RETROGRADE CHOLANGIOPANCREATOGRAPHY (ERCP);  Surgeon: Inda Castle, MD;  Location: [facility];  Service: Gastroenterology;  Laterality: N/A;  . Cataract extraction w/ intraocular lens  implant, bilateral    . Appendectomy  1933  . Cardiac catheterization  07-15-2003 DR HELEN PRESTON    MODERATE TO MODERATELY SEVERE CALCIFIC AORTIC STENOSIS/  NORMAL CORONARY ARTERIES  . Right shoulder arthroscopy w/ debridement rotator cuff and labral tear/ acrominoplasty/ distal clavicle excision/ ca ligament release  10-08-1999  . Cysto/ hydrodistention/ instillation clorpactin  MULTIPLE  last one 2009  . Mini-open right rotator cuff repair   07-12-2001  . Transvaginal tape procedure  07-30-2002  . Lumbar disc surgery  1985 &  2007  . Macroplastique urethral implantation  08-27-2009  . Transthoracic echocardiogram  08-10-2011    MILD LVH/  EF 55-60%/  NORMAL AVR TISSUE/ MILD MR/  MODERATE DILATED LA/  MILD DILATED RA  . Cardiovascular stress test  01-30-2012  DR NASHER    NORMAL NUCLEAR STUDY/  EF 73%/  NORMAL LVF  . Tonsillectomy and adenoidectomy    . Carpal tunnel release Bilateral   . Cysto with hydrodistension N/A 02/05/2013    Procedure: CYSTOSCOPY/HYDRODISTENSION;  Surgeon: Irine Seal, MD;  Location: [facility];  Service: Urology;  Laterality: N/A;  . Left heart catheterization with coronary angiogram N/A 10/18/2013    Procedure: LEFT HEART  CATHETERIZATION WITH CORONARY ANGIOGRAM;  Surgeon: Jettie Booze, MD;  Location: [facility] CATH LAB;  Service: Cardiovascular;  Laterality: N/A;  . Esophagogastroduodenoscopy (egd) with propofol N/A 08/18/2014    Procedure: ESOPHAGOGASTRODUODENOSCOPY (EGD) WITH PROPOFOL;  Surgeon: Inda Castle, MD;  Location: [facility];  Service: Endoscopy;  Laterality: N/A;   Family History  Problem Relation Age of Onset  . Heart disease Father   . Coronary artery disease Brother   . Bipolar disorder Brother     commited suiside at 29yo  . Alcohol abuse Sister     sister #1  . Alcohol abuse Sister     sister #2  . Colon cancer Maternal Aunt   . Stomach cancer Maternal Grandmother   . Lung disease Neg Hx   . Esophageal cancer Maternal Grandfather   . Bipolar disorder Son     Committed Suicide   Social History   Social History  . Marital Status: Widowed    Spouse Name: N/A  . Number of Children: 4  . Years of Education: N/A   Occupational History  . retired    Social History Main Topics  . Smoking status: Former Smoker -- 0.50 packs/day for 40 years    Types: Cigarettes    Start date: 05/06/1949    Quit date: 03/06/1990  . Smokeless tobacco: Never Used  .  Alcohol Use: No     Comment: Remote social EtOH  . Drug Use: No  . Sexual Activity: No   Other Topics Concern  . None   Social History Narrative   She is originally from [place], [place]. She has previously lived in [place] for 10 years. She has traveled to all 29 states as well as every [place] in [place]. Prior travel to [place], [place], [place]. Previously worked as an Optometrist. Remote exposure to a parakeet for 2 years in 84s. No known asbestos, mold, or hot tub exposure. She enjoys doing needle point & hand crafts.        Objective:   Physical Exam BP 120/70 mmHg  Pulse 60  Ht 5\' 1"  (1.549 m)  Wt 138 lb (62.596 kg)  BMI 26.09 kg/m2  SpO2 98% General:  Elderly Caucasian female. Well-dressed. No distress.  Integument:  Warm & dry. No rash on exposed skin.  HEENT:  Moist mucous membranes. No oral ulcers. Mild bilateral nasal turbinate swelling right greater than left. Cardiovascular: Regular rate. No edema.  Pulmonary: Good aeration bilaterally. Clear to auscultation. Normal work of breathing. Abdomen: Soft. Normal bowel sounds. Nondistended.  Lymphatics:  No appreciated cervical or supracavicular LAD.  PFT 03/26/15: FVC 2.15 L (107%) FEV1 1.52 L (105%) FEV1/FVC 0.71 FEF 25-75 1.00 L (107%) no bronchodilator response 12/26/14:FVC 2.09 L (104%) FEV1 1.49 L (101%) FEV1/FVC 0.71 FEF 25-75 0.71 L (75%) no bronchodilator response TLC 3.69 L (80%) RV 65% DLCO uncorrected 62%  6MWT 12/26/14: Walked 180 meters / Baseline Sat 99% on RA / Nadir Sat 99% on RA (c/o hip/leg/calf pain)  IMAGING CT CHEST W/O 12/31/14 (previously reviewed by me): Left lower lobe density resolved. Mild cardiomegaly without pericardial effusion. No pleural effusion or thickening. Apical predominant centrilobular emphysema. Precarinal lymph node measuring 1.2 cm in short axis.  SWALLOW EVALUATION (12/11/14): No evidence for aspiration..  CXR PA/LAT 10/31/14 (previously reviewed by me): Pronounced curvature of the thoracic  spine. Cardiomegaly noted. No nodule or opacity appreciated. No pleural effusion. Mediastinum otherwise normal in contour.  CT CHEST W/ 10/07/14 (previously reviewed by me): No pleural effusion or  thickening. Borderline precarinal lymph node measuring 1 cm in short axis. Upper lobe predominant centrilobular emphysema with some cystic changes. 8 mm & 9 mm nodules within the left costophrenic sulcus with some spiculation. No other opacity appreciated.  CARDIAC TTE (10/12/13): LV normal in size with moderate LVH. EF 60-65%. Adequate for wall motion assessment. Grade 2 diastolic dysfunction. LA & RA severely dilated. RV poorly visualized but appears mild to moderately enlarged with "likely normal function". Pulmonary artery systolic pressure 41 mmHg. IVC dilated. No pericardial effusion. No aortic stenosis or regurgitation. Mild mitral regurgitation without stenosis. Mild pulmonic and tricuspid valve regurgitation without stenosis.  LABS 01/29/15 Alpha-1 Antitrypsin:  MM (170)  11/12/14 INR: 3.1  10/31/14 CBC: 9.6/1.0/33.6/311 BMP: 128/3.11/04/22/5/0.61/125/8.9    Assessment & Plan:  81 year old female with underlying apical predominant emphysema, mediastinal adenopathy, pulmonary hypertension & GERD.  Patient had no symptomatic benefit with anticholinergic therapy. Additionally her spirometry has significantly improved today. I believe that her cough is likely due to postnasal drainage from probable allergic rhinitis. Especially since she responded to antihistamine medications previously. I did caution the patient against potential worsening of her underlying hypertension if she takes medication with a decongestant. She has no signs of volume overload from her pulmonary hypertension today. We also discussed her prior CT scan and the need for continued follow-up of her mediastinal lymphadenopathy in case there are signs of progression which could signify underlying lymphoma. I instructed the patient to  contact my office if she had any new breathing problems before her next appointment as I would be happy to see her sooner.  1. Cough/allergic rhinitis: Recommended the patient try using Mucinex along with Zyrtec, Claritin, or Allegra simply without decongestant to minimize effect on blood pressure. 2. Centrilobular emphysema:  No evidence for alpha-1 antitrypsin deficiency. No symptomatic benefit with inhaler therapy. Continuing to hold on attempting treatment with further inhaler medications. 3. Mediastinal adenopathy: Repeat CT Chest w/o contrast March 2017. 4. Pulmonary hypertension: Multifactorial. No evidence of volume overload today. No need for pulmonary arterial vasodilator therapy. 5. GERD: Prescribed Protonix & Zantac. Well controlled. 6. Health maintenance: Influenza vaccine & Pneumonia vaccine Fall 2016. 7. Follow-up: Patient to return to clinic March 2017.

## 2015-03-26 NOTE — Patient Instructions (Addendum)
1. Continue taking your medications as prescribed. 2. We will repeat your chest CT scan in March 2017 to follow-up on the lymph nodes around your windpipe in your lungs. 3. You can try the following medications to help with your sinus congestion & cough. Let me know if they don't work. Remember to avoid anything with a "decongestant" in it. I would only use Zyrtec, Allegra, or Claritin as they are all anti-histamines and work in the same way. 1. Mucinex... Generic is Guaifenesin. 2. Zyrtec... Generic is Cetirizine 3. Claritin... Generic is Loratadine  I will see you back in March after your CT scan.

## 2015-03-26 NOTE — Progress Notes (Signed)
Spirometry pre and post done today. 

## 2015-04-02 ENCOUNTER — Telehealth: Payer: Self-pay | Admitting: Pulmonary Disease

## 2015-04-02 ENCOUNTER — Other Ambulatory Visit: Payer: Self-pay | Admitting: Cardiovascular Disease

## 2015-04-02 NOTE — Telephone Encounter (Signed)
Spoke with pt. She called the wrong office. Pt meant to call her PCP's office. Nothing further was needed.

## 2015-04-02 NOTE — Telephone Encounter (Deleted)
Patient Instructions     1. Continue taking your medications as prescribed. 2. We will repeat your chest CT scan in March 2017 to follow-up on the lymph nodes around your windpipe in your lungs. 3. You can try the following medications to help with your sinus congestion & cough. Let me know if they don't work. Remember to avoid anything with a "decongestant" in it. I would only use Zyrtec, Allegra, or Claritin as they are all anti-histamines and work in the same way. 1. Mucinex... Generic is Guaifenesin. 2. Zyrtec... Generic is Cetirizine 3. Claritin... Generic is Loratadine  I will see you back in March after your CT scan

## 2015-04-20 DIAGNOSIS — R600 Localized edema: Secondary | ICD-10-CM | POA: Diagnosis not present

## 2015-04-20 DIAGNOSIS — I130 Hypertensive heart and chronic kidney disease with heart failure and stage 1 through stage 4 chronic kidney disease, or unspecified chronic kidney disease: Secondary | ICD-10-CM | POA: Diagnosis not present

## 2015-05-06 ENCOUNTER — Ambulatory Visit (INDEPENDENT_AMBULATORY_CARE_PROVIDER_SITE_OTHER): Payer: Medicare Other | Admitting: Pharmacist

## 2015-05-06 ENCOUNTER — Other Ambulatory Visit: Payer: Self-pay | Admitting: Physician Assistant

## 2015-05-06 DIAGNOSIS — I480 Paroxysmal atrial fibrillation: Secondary | ICD-10-CM | POA: Diagnosis not present

## 2015-05-06 DIAGNOSIS — I482 Chronic atrial fibrillation, unspecified: Secondary | ICD-10-CM

## 2015-05-06 DIAGNOSIS — I214 Non-ST elevation (NSTEMI) myocardial infarction: Secondary | ICD-10-CM

## 2015-05-06 LAB — POCT INR: INR: 1.7

## 2015-05-07 NOTE — Telephone Encounter (Signed)
Ok to refill x 2 .. Please call pt and let her know that future Rx for Ativan will need to come from her PCP as Dr Deatra Ina is no longer with our group

## 2015-05-07 NOTE — Telephone Encounter (Signed)
Pt requesting refills on Ativan 0.5mg  1 po bid as needed. Last office visit 09-24-2014. Please advise.

## 2015-05-11 ENCOUNTER — Telehealth: Payer: Self-pay | Admitting: *Deleted

## 2015-05-11 NOTE — Telephone Encounter (Signed)
Patient called & stated she was having a spinal procedure on 05/19/15 by Dr. Maryjean Ka and need to be off her Coumadin.  Instructed her that we will need to have a clearance form from Dr. Maryjean Ka office and we will need to have her Cardiologist approve to hold for procedure.  Called Dr. Maryjean Ka office and they stated they will fax over a form for clearance. Provided them with the Coumadin Clinic fax number.

## 2015-05-12 ENCOUNTER — Telehealth: Payer: Self-pay | Admitting: Pharmacist

## 2015-05-12 NOTE — Telephone Encounter (Signed)
I would recommend that we bridge her with Lovenox

## 2015-05-12 NOTE — Telephone Encounter (Signed)
Will fax clearance to [facility] and schedule Lovenox bridging appointment for patient in Coumadin clinic.

## 2015-05-12 NOTE — Telephone Encounter (Signed)
Originally called pt to set up Coumadin appt tomorrow since injection is schedule for 2/7. Pt was very opposed to injections since she has previously been off Coumadin twice for injections and was not on Lovenox either time. Advised her of the risk of stroke while her Coumadin is not therapeutic and pt was still hesitant.   Advised pt that I would discuss with Dr. Acie Fredrickson. He gave verbal ok for no Lovenox bridge, however when I called pt back with this information she stated that she would do the Lovenox injections since we thought they would be beneficial for her... Set pt up with appt in Coumadin clinic 2/1 to discuss Lovenox bridge.

## 2015-05-12 NOTE — Telephone Encounter (Signed)
Pt has some hesitancy about getting Lovenox bridging. After some discussion , she agrees to have Lovenox bridging

## 2015-05-12 NOTE — Telephone Encounter (Signed)
Received clearance form from [facility] for an epidural steroid injection (date not scheduled yet) and request to hold Coumadin x5 days. Pt on Coumadin for afib with CHADS2 score of 5 (CHF, HTN, age, stroke) - per protocol, pt would be bridged with Lovenox. However, upon reviewing pt's chart, she has held Coumadin x4 days for epidurals in the past (January 2016) with no Lovenox bridge. Given this discrepancy and pt's advanced age, will route to Dr. Acie Fredrickson for his recommendation regarding potential need for Lovenox.  [facility] Telephone: [phone] Fax: [phone]

## 2015-05-13 ENCOUNTER — Ambulatory Visit (INDEPENDENT_AMBULATORY_CARE_PROVIDER_SITE_OTHER): Payer: Medicare Other | Admitting: Pharmacist

## 2015-05-13 DIAGNOSIS — I214 Non-ST elevation (NSTEMI) myocardial infarction: Secondary | ICD-10-CM | POA: Diagnosis not present

## 2015-05-13 DIAGNOSIS — I482 Chronic atrial fibrillation, unspecified: Secondary | ICD-10-CM

## 2015-05-13 DIAGNOSIS — I480 Paroxysmal atrial fibrillation: Secondary | ICD-10-CM

## 2015-05-13 LAB — POCT INR: INR: 2

## 2015-05-13 MED ORDER — ENOXAPARIN SODIUM 60 MG/0.6ML ~~LOC~~ SOLN: 60.0000 mg | Freq: Two times a day (BID) | SUBCUTANEOUS | Status: DC

## 2015-05-13 NOTE — Patient Instructions (Signed)
2/1: Last dose of Coumadin  2/2: No Coumadin or Lovenox   2/3: Inject Lovenox 60mg  in the fatty abdominal tissue at least 2 inches from the belly button twice a day about 12 hours apart, 9am and 9pm rotate sites. No Coumadin  2/4: Inject Lovenox in the fatty tissue every 12 hours, 9am and 9pm. No Coumadin  2/5: Inject Lovenox in the fatty tissue every 12 hours, 9am and 9pm. No Coumadin  2/6: Inject Lovenox in the fatty tissue in the morning at 9 am (NO pm dose). No Coumadin  2/7: Procedure Day - No Lovenox - Resume Coumadin in the evening or as directed by doctor (take an extra half tablet with usual dose for 2 days then resume normal dose)  2/8: Resume Lovenox inject in the fatty tissue every 12 hours and take coumadin   2/9: Inject Lovenox in the fatty tissue every 12 hours and take coumadin  2/10: Inject Lovenox in the fatty tissue every 12 hours and take coumadin   2/11: Inject Lovenox in the fatty tissue every 12 hours and take coumadin  2/12: Inject Lovenox in the fatty tissue every 12 hours and take coumadin  2/13: Coumadin appt to check INR

## 2015-05-18 ENCOUNTER — Other Ambulatory Visit: Payer: Self-pay | Admitting: Cardiovascular Disease

## 2015-05-19 DIAGNOSIS — M5416 Radiculopathy, lumbar region: Secondary | ICD-10-CM | POA: Diagnosis not present

## 2015-05-25 ENCOUNTER — Ambulatory Visit (INDEPENDENT_AMBULATORY_CARE_PROVIDER_SITE_OTHER): Payer: Medicare Other | Admitting: *Deleted

## 2015-05-25 DIAGNOSIS — I482 Chronic atrial fibrillation, unspecified: Secondary | ICD-10-CM

## 2015-05-25 DIAGNOSIS — I480 Paroxysmal atrial fibrillation: Secondary | ICD-10-CM

## 2015-05-25 DIAGNOSIS — I214 Non-ST elevation (NSTEMI) myocardial infarction: Secondary | ICD-10-CM

## 2015-05-25 LAB — POCT INR: INR: 1.8

## 2015-05-26 ENCOUNTER — Other Ambulatory Visit: Payer: Self-pay | Admitting: Gastroenterology

## 2015-05-27 ENCOUNTER — Ambulatory Visit (INDEPENDENT_AMBULATORY_CARE_PROVIDER_SITE_OTHER): Payer: Medicare Other | Admitting: Pharmacist

## 2015-05-27 DIAGNOSIS — I214 Non-ST elevation (NSTEMI) myocardial infarction: Secondary | ICD-10-CM

## 2015-05-27 DIAGNOSIS — I482 Chronic atrial fibrillation, unspecified: Secondary | ICD-10-CM

## 2015-05-27 DIAGNOSIS — I480 Paroxysmal atrial fibrillation: Secondary | ICD-10-CM

## 2015-05-27 LAB — POCT INR: INR: 2

## 2015-06-01 ENCOUNTER — Telehealth: Payer: Self-pay | Admitting: Pulmonary Disease

## 2015-06-01 NOTE — Telephone Encounter (Signed)
Called spoke w/ pt. She reports she has been having a lot of nausea and diarrhea x 3 weeks. She has no appetite. I advised her she needed to call her PCP as we are her pulmonologist's. She will do so. Nothing further needed

## 2015-06-03 ENCOUNTER — Ambulatory Visit (INDEPENDENT_AMBULATORY_CARE_PROVIDER_SITE_OTHER)
Admission: RE | Admit: 2015-06-03 | Discharge: 2015-06-03 | Disposition: A | Discharge status: Home / Self Care | Payer: Medicare Other | Source: Ambulatory Visit | Attending: Pulmonary Disease | Admitting: Pulmonary Disease

## 2015-06-03 DIAGNOSIS — R05 Cough: Secondary | ICD-10-CM | POA: Diagnosis not present

## 2015-06-03 DIAGNOSIS — J432 Centrilobular emphysema: Secondary | ICD-10-CM

## 2015-06-03 DIAGNOSIS — R059 Cough, unspecified: Secondary | ICD-10-CM

## 2015-06-06 DIAGNOSIS — M4806 Spinal stenosis, lumbar region: Secondary | ICD-10-CM | POA: Diagnosis not present

## 2015-06-06 DIAGNOSIS — M5136 Other intervertebral disc degeneration, lumbar region: Secondary | ICD-10-CM | POA: Diagnosis not present

## 2015-06-08 ENCOUNTER — Other Ambulatory Visit: Payer: Self-pay | Admitting: Gastroenterology

## 2015-06-09 ENCOUNTER — Encounter: Payer: Self-pay | Admitting: Pulmonary Disease

## 2015-06-09 ENCOUNTER — Ambulatory Visit (INDEPENDENT_AMBULATORY_CARE_PROVIDER_SITE_OTHER): Payer: Medicare Other | Admitting: Pulmonary Disease

## 2015-06-09 VITALS — BP 110/56 | HR 52 | Ht 61.0 in | Wt 142.2 lb

## 2015-06-09 DIAGNOSIS — I272 Other secondary pulmonary hypertension: Secondary | ICD-10-CM

## 2015-06-09 DIAGNOSIS — I214 Non-ST elevation (NSTEMI) myocardial infarction: Secondary | ICD-10-CM

## 2015-06-09 DIAGNOSIS — R590 Localized enlarged lymph nodes: Secondary | ICD-10-CM

## 2015-06-09 DIAGNOSIS — J432 Centrilobular emphysema: Secondary | ICD-10-CM | POA: Diagnosis not present

## 2015-06-09 DIAGNOSIS — R1310 Dysphagia, unspecified: Secondary | ICD-10-CM

## 2015-06-09 DIAGNOSIS — K219 Gastro-esophageal reflux disease without esophagitis: Secondary | ICD-10-CM

## 2015-06-09 DIAGNOSIS — R599 Enlarged lymph nodes, unspecified: Secondary | ICD-10-CM | POA: Diagnosis not present

## 2015-06-09 DIAGNOSIS — J309 Allergic rhinitis, unspecified: Secondary | ICD-10-CM | POA: Diagnosis not present

## 2015-06-09 NOTE — Patient Instructions (Addendum)
   Continue taking your medications as prescribed.  We will repeat your chest CT in August 2017.  I'm arranging a swallowing test and a referral to the GI doctor.   I will see you back in August after your CT scan.  Call me if you have any new problems breathing before then.  TESTS ORDERED: 1. CT CHEST W/O CONTRAST AUGUST 2017 2. BARIUM SWALLOW

## 2015-06-09 NOTE — Progress Notes (Signed)
Subjective:    Patient ID: Brittany [name], female    DOB: 1927/10/16, 82 y.o.   MRN: [id number]  C.C.:  Follow-up for Centrilobular Emphysema, Mediastinal Adenopathy, Pulmonary Hypertension, Allergic Rhinitis, & GERD.  HPI Centrilobular Emphysema: Previously tried Incruse. No alpha-1 antitrypsin deficiency. She reports her cough has nearly totally resolved. She has continued dyspnea with exertion that is unchanged.   Mediastinal Adenopathy: Enlargement first appreciated September 2016. Largest lymph node is precarinal and measures 1.2 cm. The lymph node hasn't changed significantly in size on the most recent CT scan.  Pulmonary Hypertension: Likely secondary to left ventricular dysfunction & prior aortic valve disease as well as emphysema. She has had some mild lower extremity edema. No syncope or near syncope. Using Lasix intermittently managed by her PCP treating her edema.   Allergic Rhinitis:  She reports she is having some sinus congestion, particularly on the left. She is having some "bloody scabs" from her left nare. She has had infrequent epistaxis. No sinus pressure. She is using an OTC antihistamine.  GERD: Prescribed Protonix & Zantac. She does have intermittent nausea. She reports nausea is predominantly with eating. She does have dysphagia at the base of her neck with solid foods but no liquids. No morning brash water taste. No odynophagia. This has developed since her swallow evaluation in September 2016.  Review of Systems No chest pain or pressure. No fever, chills, or sweats.  Allergies  Allergen Reactions  . Amlodipine     Other reaction(s): edema  . Amoxicillin Diarrhea and Nausea And Vomiting  . Carafate [Sucralfate] Swelling    Swelling in knees and blocky red marks on them.  . Cephalexin Diarrhea  . Codeine Nausea Only  . Irbesartan Swelling    Facial and hand numbness and swelling. Patient believes reaction was with this medication but isn't 100% sure. She is  currently tolerating lisinopril.  Marland Kitchen Morphine And Related Other (See Comments)    HX ADDICTION / WITHDRAWAL  . Neurontin [Gabapentin] Swelling    Legs swell  . Nitrofurantoin Monohyd Macro Diarrhea and Nausea Only  . Sulfa Antibiotics Rash   Current Outpatient Prescriptions on File Prior to Visit  Medication Sig Dispense Refill  . atenolol (TENORMIN) 25 MG tablet TAKE 1 TABLET (25 MG TOTAL) BY MOUTH DAILY. (Patient taking differently: Take 1 tablet Twice a day) 30 tablet 3  . atorvastatin (LIPITOR) 20 MG tablet Take 20 mg by mouth daily.    Marland Kitchen CARTIA XT 120 MG 24 hr capsule TAKE 1 CAPSULE (120 MG TOTAL) BY MOUTH DAILY. (Patient taking differently: Takes 1 CAPSULE Twice a day) 30 capsule 3  . HYDROcodone-acetaminophen (NORCO/VICODIN) 5-325 MG per tablet Take 1 tablet by mouth every 6 (six) hours as needed.    . hyoscyamine (LEVSIN, ANASPAZ) 0.125 MG tablet Take 0.125 mg by mouth every 4 (four) hours as needed.    Marland Kitchen ipratropium (ATROVENT) 0.06 % nasal spray 1-2 sprays each nostril twice daily    . lisinopril (PRINIVIL,ZESTRIL) 20 MG tablet Take 20 mg by mouth daily.    Marland Kitchen LORazepam (ATIVAN) 0.5 MG tablet TAKE 1 TABLET BY MOUTH TWICE DAILY 60 tablet 2  . nystatin (MYCOSTATIN) 100000 UNIT/ML suspension Take 5 mLs (500,000 Units total) by mouth 3 (three) times daily. (Patient taking differently: Take 5 mLs by mouth 3 (three) times daily as needed. ) 60 mL 1  . ondansetron (ZOFRAN) 4 MG tablet Take 4 mg by mouth 2 (two) times daily as needed for nausea or vomiting (nausea/vomiting).    Marland Kitchen  oxybutynin (DITROPAN) 5 MG tablet Take 5 mg by mouth 2 (two) times daily.    . pantoprazole (PROTONIX) 40 MG tablet Take 1 tablet (40 mg total) by mouth daily. 90 tablet 3  . PROAIR HFA 108 (90 BASE) MCG/ACT inhaler 2 puffs every 4 (four) hours as needed.    . promethazine (PHENERGAN) 12.5 MG tablet Take 1 tablet (12.5 mg total) by mouth every 6 (six) hours as needed for nausea or vomiting. 30 tablet 0  . ranitidine  (ZANTAC) 150 MG tablet Take 150 mg by mouth at bedtime.    Marland Kitchen warfarin (COUMADIN) 5 MG tablet Take as directed by [facility] 30 tablet 2   No current facility-administered medications on file prior to visit.   Past Medical History  Diagnosis Date  . Dyslipidemia     takes Lipitor daily  . Arthritis   . Urine incontinence   . Diverticulosis   . IC (interstitial cystitis)   . S/P aortic valve replacement     2005  . H/O hiatal hernia   . History of TIA (transient ischemic attack)     2013-- RESIDUAL PERIPHERAL VISION RIGHT EYE--  RESOLVED  . History of bacterial endocarditis   . Foot drop     SINCE 1987  . History of gastritis   . Chronic back pain   . Hypertension   . Atrial fibrillation ([place])   . GERD (gastroesophageal reflux disease)   . Hearing impaired     Bilateral hearing aids  . History of stomach ulcers     many yrs ago  . Dysrhythmia     Atrial Fibrillation  . Pulmonary hypertension ([place]) 10/2013    Based on TTE   Past Surgical History  Procedure Laterality Date  . Abdominal hysterectomy  1967  . Aortic valve replacement  09-29-2003     [facility] pericardial tissue valve  . Cholecystectomy  1993  . Vein ligation  1969    RIGHT LOWER LEG  . Dilation and curettage of uterus    . Ercp N/A 08/10/2012    Procedure: ENDOSCOPIC RETROGRADE CHOLANGIOPANCREATOGRAPHY (ERCP);  Surgeon: Inda Castle, MD;  Location: [facility];  Service: Gastroenterology;  Laterality: N/A;  . Cataract extraction w/ intraocular lens  implant, bilateral    . Appendectomy  1933  . Cardiac catheterization  07-15-2003 DR HELEN PRESTON    MODERATE TO MODERATELY SEVERE CALCIFIC AORTIC STENOSIS/  NORMAL CORONARY ARTERIES  . Right shoulder arthroscopy w/ debridement rotator cuff and labral tear/ acrominoplasty/ distal clavicle excision/ ca ligament release  10-08-1999  . Cysto/ hydrodistention/ instillation clorpactin  MULTIPLE  last one 2009  . Mini-open right rotator cuff repair   07-12-2001  . Transvaginal tape procedure  07-30-2002  . Lumbar disc surgery  1985 &  2007  . Macroplastique urethral implantation  08-27-2009  . Transthoracic echocardiogram  08-10-2011    MILD LVH/  EF 55-60%/  NORMAL AVR TISSUE/ MILD MR/  MODERATE DILATED LA/  MILD DILATED RA  . Cardiovascular stress test  01-30-2012  DR NASHER    NORMAL NUCLEAR STUDY/  EF 73%/  NORMAL LVF  . Tonsillectomy and adenoidectomy    . Carpal tunnel release Bilateral   . Cysto with hydrodistension N/A 02/05/2013    Procedure: CYSTOSCOPY/HYDRODISTENSION;  Surgeon: Irine Seal, MD;  Location: [facility];  Service: Urology;  Laterality: N/A;  . Left heart catheterization with coronary angiogram N/A 10/18/2013    Procedure: LEFT HEART CATHETERIZATION WITH CORONARY ANGIOGRAM;  Surgeon:  Jettie Booze, MD;  Location: [facility] CATH LAB;  Service: Cardiovascular;  Laterality: N/A;  . Esophagogastroduodenoscopy (egd) with propofol N/A 08/18/2014    Procedure: ESOPHAGOGASTRODUODENOSCOPY (EGD) WITH PROPOFOL;  Surgeon: Inda Castle, MD;  Location: [facility];  Service: Endoscopy;  Laterality: N/A;   Family History  Problem Relation Age of Onset  . Heart disease Father   . Coronary artery disease Brother   . Bipolar disorder Brother     commited suiside at 84yo  . Alcohol abuse Sister     sister #1  . Alcohol abuse Sister     sister #2  . Colon cancer Maternal Aunt   . Stomach cancer Maternal Grandmother   . Lung disease Neg Hx   . Esophageal cancer Maternal Grandfather   . Bipolar disorder Son     Committed Suicide   Social History   Social History  . Marital Status: Widowed    Spouse Name: N/A  . Number of Children: 4  . Years of Education: N/A   Occupational History  . retired    Social History Main Topics  . Smoking status: Former Smoker -- 0.50 packs/day for 40 years    Types: Cigarettes    Start date: 05/06/1949    Quit date: 03/06/1990  . Smokeless tobacco: Never Used  .  Alcohol Use: No     Comment: Remote social EtOH  . Drug Use: No  . Sexual Activity: No   Other Topics Concern  . None   Social History Narrative   She is originally from [place], [place]. She has previously lived in [place] for 10 years. She has traveled to all 8 states as well as every [place] in [place]. Prior travel to [place], [place], [place] [employer]. Previously worked as an Optometrist. Remote exposure to a parakeet for 2 years in 28s. No known asbestos, mold, or hot tub exposure. She enjoys doing needle point & hand crafts.        Objective:   Physical Exam BP 110/56 mmHg  Pulse 52  Ht 5\' 1"  (1.549 m)  Wt 142 lb 3.2 oz (64.501 kg)  BMI 26.88 kg/m2  SpO2 95% General:  Elderly Caucasian female. No distress. Alert. Integument:  Warm & dry. No rash on exposed skin.  HEENT:  Moist mucous membranes. No oral ulcers. Mild dried blood left anterior nare. Cardiovascular: Regular rate. Trace edema bilateral lower extremities. Pulmonary: Clear to auscultation bilaterally. Normal work of breathing on room air. Speaking in complete sentences. Abdomen: Soft. Normal bowel sounds. Nondistended.  Lymphatics:  No appreciated cervical or supracavicular LAD.  PFT 03/26/15: FVC 2.15 L (107%) FEV1 1.52 L (105%) FEV1/FVC 0.71 FEF 25-75 1.00 L (107%) no bronchodilator response 12/26/14:FVC 2.09 L (104%) FEV1 1.49 L (101%) FEV1/FVC 0.71 FEF 25-75 0.71 L (75%) no bronchodilator response TLC 3.69 L (80%) RV 65% DLCO uncorrected 62%  6MWT 12/26/14: Walked 180 meters / Baseline Sat 99% on RA / Nadir Sat 99% on RA (c/o hip/leg/calf pain)  IMAGING CT CHEST W/O 06/03/15 (personally reviewed by me): 1.3 cm right paratracheal lymph node that is precarinal unchanged. No other nodule or opacity appreciated. No pathologic mediastinal adenopathy otherwise. Apical predominant centrilobular emphysema unchanged. Right pleural effusion versus thickening that is mild and new compared with prior CT. No pericardial effusion.  CT CHEST  W/O 12/31/14 (previously reviewed by me): Left lower lobe density resolved. Mild cardiomegaly without pericardial effusion. No pleural effusion or thickening. Apical predominant centrilobular emphysema. Precarinal lymph node measuring 1.2 cm in short  axis.  SWALLOW EVALUATION (12/11/14): No evidence for aspiration..  CXR PA/LAT 10/31/14 (previously reviewed by me): Pronounced curvature of the thoracic spine. Cardiomegaly noted. No nodule or opacity appreciated. No pleural effusion. Mediastinum otherwise normal in contour.  CT CHEST W/ 10/07/14 (previously reviewed by me): No pleural effusion or thickening. Borderline precarinal lymph node measuring 1 cm in short axis. Upper lobe predominant centrilobular emphysema with some cystic changes. 8 mm & 9 mm nodules within the left costophrenic sulcus with some spiculation. No other opacity appreciated.  CARDIAC TTE (10/12/13): LV normal in size with moderate LVH. EF 60-65%. Adequate for wall motion assessment. Grade 2 diastolic dysfunction. LA & RA severely dilated. RV poorly visualized but appears mild to moderately enlarged with "likely normal function". Pulmonary artery systolic pressure 41 mmHg. IVC dilated. No pericardial effusion. No aortic stenosis or regurgitation. Mild mitral regurgitation without stenosis. Mild pulmonic and tricuspid valve regurgitation without stenosis.  LABS 01/29/15 Alpha-1 Antitrypsin:  MM (170)  11/12/14 INR: 3.1  10/31/14 CBC: 9.6/1.0/33.6/311 BMP: 128/3.11/04/22/5/0.61/125/8.9    Assessment & Plan:  82 year old female with underlying apical predominant emphysema, mediastinal adenopathy, pulmonary hypertension & GERD.  Patient's allergic rhinitis has significantly improved since she started over-the-counter antihistamine therapy. This appears relatively unchanged. Patient does have signs of mild volume overload today and is using Lasix intermittently prescribed by her primary care physician. I suspect the trace left pleural  effusion versus pleural thickening on her CT scan is likely more effusion and secondary to left ventricular dysfunction. The patient is concerning illnesses that she is having some new dysphasia with history of EGD in May 2016. She is compliant with her antireflux medication but this still should be investigated further. We did have a lengthy discussion regarding the possibility of lymphoma given her mildly enlarged mediastinal lymphadenopathy, given the patient's concern she wishes to continue to follow this with imaging. I instructed the patient to contact my office for any new breathing problems before her next appointment.  1. Centrilobular emphysema: Continuing to hold on initiating inhaler therapy at this time. No symptomatic benefit from inhalers in the past. Holding on further point function testing as well. 2. Mediastinal adenopathy: Repeat CT Chest w/o contrast August 2017. 3. Pulmonary hypertension: Multifactorial. Mild volume overload today. Using Lasix intermittently by PCP. No indication for pulmonary arterial vasodilator therapy. 4. Allergic rhinitis: Continuing on over-the-counter antihistamine therapy. Good control of symptoms. 5. GERD with dysphagia: Continuing Protonix & Zantac. Plan to arrange for an appointment with GI for follow-up. Checking barium swallow. 6. Health maintenance: Influenza vaccine & Pneumonia vaccine Fall 2016. 7. Follow-up: Return to clinic August 2017 or sooner if needed.  Sonia Baller Ashok Cordia, M.D. [facility] Pager:  [phone] After 3pm or if no response, call [phone] 2:05 PM 06/09/2015

## 2015-06-15 ENCOUNTER — Ambulatory Visit (HOSPITAL_COMMUNITY)
Admission: RE | Admit: 2015-06-15 | Discharge: 2015-06-15 | Disposition: A | Discharge status: Home / Self Care | Payer: Medicare Other | Source: Ambulatory Visit | Attending: Pulmonary Disease | Admitting: Pulmonary Disease

## 2015-06-15 ENCOUNTER — Inpatient Hospital Stay: Admission: RE | Admit: 2015-06-15 | Payer: Medicare Other | Source: Ambulatory Visit

## 2015-06-15 DIAGNOSIS — K219 Gastro-esophageal reflux disease without esophagitis: Secondary | ICD-10-CM | POA: Diagnosis not present

## 2015-06-15 DIAGNOSIS — K224 Dyskinesia of esophagus: Secondary | ICD-10-CM | POA: Diagnosis not present

## 2015-06-15 DIAGNOSIS — R1310 Dysphagia, unspecified: Secondary | ICD-10-CM | POA: Diagnosis present

## 2015-06-16 DIAGNOSIS — M5416 Radiculopathy, lumbar region: Secondary | ICD-10-CM | POA: Diagnosis not present

## 2015-06-16 DIAGNOSIS — M4806 Spinal stenosis, lumbar region: Secondary | ICD-10-CM | POA: Diagnosis not present

## 2015-06-16 DIAGNOSIS — Z6826 Body mass index (BMI) 26.0-26.9, adult: Secondary | ICD-10-CM | POA: Diagnosis not present

## 2015-06-16 DIAGNOSIS — M545 Low back pain: Secondary | ICD-10-CM | POA: Diagnosis not present

## 2015-06-16 DIAGNOSIS — M542 Cervicalgia: Secondary | ICD-10-CM | POA: Diagnosis not present

## 2015-06-18 ENCOUNTER — Encounter: Payer: Self-pay | Admitting: Cardiovascular Disease

## 2015-06-18 ENCOUNTER — Ambulatory Visit (INDEPENDENT_AMBULATORY_CARE_PROVIDER_SITE_OTHER): Payer: Medicare Other | Admitting: Cardiovascular Disease

## 2015-06-18 VITALS — BP 122/52 | HR 42 | Ht 61.0 in | Wt 141.1 lb

## 2015-06-18 DIAGNOSIS — I482 Chronic atrial fibrillation, unspecified: Secondary | ICD-10-CM

## 2015-06-18 DIAGNOSIS — I5032 Chronic diastolic (congestive) heart failure: Secondary | ICD-10-CM | POA: Diagnosis not present

## 2015-06-18 DIAGNOSIS — I214 Non-ST elevation (NSTEMI) myocardial infarction: Secondary | ICD-10-CM

## 2015-06-18 MED ORDER — POTASSIUM CHLORIDE ER 10 MEQ PO TBCR: EXTENDED_RELEASE_TABLET | ORAL | Status: DC

## 2015-06-18 MED ORDER — FUROSEMIDE 20 MG PO TABS: ORAL_TABLET | ORAL | Status: DC

## 2015-06-18 NOTE — Progress Notes (Signed)
Cardiology Office Note   Date:  She's been on Plavix and aspirin. 06/18/2015   ID:  Brittany [name], DOB 04-Apr-1928, MRN [id number]  PCP:  Mayra Neer, MD  Cardiologist:   Thayer Headings, MD   Chief Complaint  Patient presents with  . Follow-up    atrial fib   1. Aortic valve replacement 2. Dyslipidemia 3. Hypertension 4. 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  5. Atrial fibrillation  History of Present Illness:  Brittany [name] is an 82 yo with aortic valve replacement, HTN, and hyperlipidemia. She has not had any cardiac complaints recently. She complains of easy bruising on her arms.   She had an episode of severe heart burn this past week.   She's had a stroke over the past year. This has left her with some balance issues and some loss of peripheral vision.  Nov. 4, 2014:  Brittany [name] is doing well from a cardiac standpoint. She is having problems with interstitial cystitis. She had a procedure last week and had a small bladder tear which they are letting heal. She is still having some significant pain.   Aug 27, 2013:  Brittany [name] is doing OK. She is not having any problems . Still very active.  She had an AVR   She is having problems with interstitial cyctitis.   November 06, 2013:  Brittany [name] was admitted to the hospital twice on July , 2015: DC summary :  82 year old Caucasian female with past medical history significant for hypertension, hyperlipidemia, history of CVA permanent atrial fibrillation on Xarelto for the last 2 years, and history of aortic valve replacement with bioprosthetic valve in 2005, here with chest pain. She was recently admitted to the hospital for management of malignant HTN , diastolic HF and headache. She was seen by Dr Haroldine Laws and John Day on 10/16/2013. She returns within 24 hrs for symptoms of chest pain . Pt states that earlier in the afternoon while she was trying to sleep post lunch she started experiencing soreness ,  heaviness "someone sitting on my chest" on the left side radiating to left arm with associated mild SOB. This improved with SL NTG , however, she continues to have some chest pain. Pt denied any orthopnea, PND , LE edema , syncope, claudication , palpitation etc . She reports medication compliance. She bruises easily in her skin and once episodes of epistaxis several months ago.  She was admitted. Xarelto was held in anticipation of coronary angiography. ASA, statin, BB, IV NTG and heparin added. She ruled in for NSTEMI with troponin of 0.63. Left heart cath revealed severe, fibrotic disease in the ostial right coronary artery which was the culprit lesion. This was successfully stented with a 2.5 x 14 resolute drug-eluting stent, postdilated to 3.3 mm in diameter. She was started on plavix, however, a P2Y12 came back at 289 so she was started on Brilinta. Cardizem cd 120 was added for afib RVR with improvement. Due to mild cough Lisinopril was changed to irbesartan. Xarelto was changed to coumadin and she will follow up two days after DC in the coumadin clinic   She now presents for follow up . She has some MSK pain ( hurts when she presses her chest. She has had some bleeding. She woke up in the morning with a small skin tear on her right ankle . There was lots of blood in the bed. The bleeding eventually stopped with pressure. She also has had a nose bleed.  She has not had any chest pain and her energy levels have been better. INR levels have been OK.   Oct. 29, 2015:  Brittany.  [name] feeling better. She had a bad viral illness last week and thinks that she might have the flu. She is getting better.  She's had some problems with anemia and she is turned in 3 sets of guaiac cards. She has had some dyspnea and was started on Lasix on a QOD schedule.  She is now off Brilinta. She had a major bleeding from he right foot. She was on brilinta because She had an inadequate response to  plavix originally but later we discovered that she was also on Omeprazole. We have stopped the omeprazone and her PRU is better. She is taking Nexium instead.   Off Eliquis. Is back on warfarin. doing well     June 20, 2014:  Brittany [name] is a 82 y.o. female who presents for follow up .  She has had prolonged nausea and abdominal pain.  Worse with eating. No cardiac symptoms .   Sept. 8, 2016:    Doing well from a cardiac issue.  has been diagnosed with COPD ( was a former smoker, 25 years ago )  Still has some stomach issues.   Is still weak but is gradually improving  Having intermittant nose bleeds - is on Plavix and couamdin  June 18, 2015:  Doing ok from a cardiac standpoint Walks with the walker - has some leg edema .  Can use a cane when she is in her house Avoids eating salt-does not eat out much .     Past Medical History  Diagnosis Date  . Dyslipidemia     takes Lipitor daily  . Arthritis   . Urine incontinence   . Diverticulosis   . IC (interstitial cystitis)   . S/P aortic valve replacement     2005  . H/O hiatal hernia   . History of TIA (transient ischemic attack)     2013-- RESIDUAL PERIPHERAL VISION RIGHT EYE--  RESOLVED  . History of bacterial endocarditis   . Foot drop     SINCE 1987  . History of gastritis   . Chronic back pain   . Hypertension   . Atrial fibrillation ([place])   . GERD (gastroesophageal reflux disease)   . Hearing impaired     Bilateral hearing aids  . History of stomach ulcers     many yrs ago  . Dysrhythmia     Atrial Fibrillation  . Pulmonary hypertension ([place]) 10/2013    Based on TTE    Past Surgical History  Procedure Laterality Date  . Abdominal hysterectomy  1967  . Aortic valve replacement  09-29-2003     [facility] pericardial tissue valve  . Cholecystectomy  1993  . Vein ligation  1969    RIGHT LOWER LEG  . Dilation and curettage of uterus    . Ercp N/A 08/10/2012    Procedure: ENDOSCOPIC  RETROGRADE CHOLANGIOPANCREATOGRAPHY (ERCP);  Surgeon: Inda Castle, MD;  Location: [place];  Service: Gastroenterology;  Laterality: N/A;  . Cataract extraction w/ intraocular lens  implant, bilateral    . Appendectomy  1933  . Cardiac catheterization  07-15-2003 DR HELEN PRESTON    MODERATE TO MODERATELY SEVERE CALCIFIC AORTIC STENOSIS/  NORMAL CORONARY ARTERIES  . Right shoulder arthroscopy w/ debridement rotator cuff and labral tear/ acrominoplasty/ distal clavicle excision/ ca ligament release  10-08-1999  . Cysto/ hydrodistention/  instillation clorpactin  MULTIPLE  last one 2009  . Mini-open right rotator cuff repair  07-12-2001  . Transvaginal tape procedure  07-30-2002  . Lumbar disc surgery  1985 &  2007  . Macroplastique urethral implantation  08-27-2009  . Transthoracic echocardiogram  08-10-2011    MILD LVH/  EF 55-60%/  NORMAL AVR TISSUE/ MILD MR/  MODERATE DILATED LA/  MILD DILATED RA  . Cardiovascular stress test  01-30-2012  DR NASHER    NORMAL NUCLEAR STUDY/  EF 73%/  NORMAL LVF  . Tonsillectomy and adenoidectomy    . Carpal tunnel release Bilateral   . Cysto with hydrodistension N/A 02/05/2013    Procedure: CYSTOSCOPY/HYDRODISTENSION;  Surgeon: Irine Seal, MD;  [facility];  Service: Urology;  Laterality: N/A;  . Left heart catheterization with coronary angiogram N/A 10/18/2013    Procedure: LEFT HEART CATHETERIZATION WITH CORONARY ANGIOGRAM;  Surgeon: Jettie Booze, MD;  Location: [facility] C[facility];  Service: Cardiovascular;  Laterality: N/A;  . Esophagogastroduodenoscopy (egd) with propofol N/A 08/18/2014    Procedure: ESOPHAGOGASTRODUODENOSCOPY (EGD) WITH PROPOFOL;  Surgeon: Inda Castle, MD;  Location: [facility];  Service: Endoscopy;  Laterality: N/A;     Current Outpatient Prescriptions  Medication Sig Dispense Refill  . atenolol (TENORMIN) 25 MG tablet Take by mouth daily.    Marland Kitchen atorvastatin (LIPITOR) 20 MG tablet Take 20 mg by mouth  daily.    . diazepam (VALIUM) 5 MG tablet Take 5 mg by mouth once a week.    . diltiazem (CARTIA XT) 120 MG 24 hr capsule Take 120 mg by mouth 2 (two) times daily.    Marland Kitchen HYDROcodone-acetaminophen (NORCO/VICODIN) 5-325 MG per tablet Take 1 tablet by mouth every 6 (six) hours as needed.    . hyoscyamine (LEVSIN, ANASPAZ) 0.125 MG tablet Take 0.125 mg by mouth every 4 (four) hours as needed.    Marland Kitchen ipratropium (ATROVENT) 0.06 % nasal spray 1-2 sprays each nostril twice daily    . lisinopril (PRINIVIL,ZESTRIL) 40 MG tablet Take 40 mg by mouth daily.    Marland Kitchen LORazepam (ATIVAN) 0.5 MG tablet TAKE 1 TABLET BY MOUTH TWICE DAILY 60 tablet 2  . nystatin (MYCOSTATIN) 100000 UNIT/ML suspension Take 5 mLs by mouth 3 (three) times daily.    . ondansetron (ZOFRAN) 4 MG tablet Take 4 mg by mouth 2 (two) times daily as needed for nausea or vomiting (nausea/vomiting).    Marland Kitchen oxybutynin (DITROPAN) 5 MG tablet Take 5 mg by mouth 2 (two) times daily.    . pantoprazole (PROTONIX) 40 MG tablet Take 1 tablet (40 mg total) by mouth daily. 90 tablet 3  . PROAIR HFA 108 (90 BASE) MCG/ACT inhaler 2 puffs every 4 (four) hours as needed for wheezing.     . promethazine (PHENERGAN) 12.5 MG tablet Take 1 tablet (12.5 mg total) by mouth every 6 (six) hours as needed for nausea or vomiting. 30 tablet 0  . ranitidine (ZANTAC) 150 MG tablet Take 150 mg by mouth at bedtime.    Marland Kitchen warfarin (COUMADIN) 5 MG tablet Take as directed by Coumadin Clinic 30 tablet 2   No current facility-administered medications for this visit.    Allergies:   Amlodipine; Amoxicillin; Carafate; Cephalexin; Codeine; Irbesartan; Morphine and related; Neurontin; Nitrofurantoin monohyd macro; and Sulfa antibiotics    Social History:  The patient  reports that she quit smoking about 25 years ago. Her smoking use included Cigarettes. She started smoking about 66 years ago. She has a 20 pack-year  smoking history. She has never used smokeless tobacco. She reports that  she does not drink alcohol or use illicit drugs.   Family History:  The patient's family history includes Alcohol abuse in her sister and sister; Bipolar disorder in her brother and son; Colon cancer in her maternal aunt; Coronary artery disease in her brother; Esophageal cancer in her maternal grandfather; Heart disease in her father; Stomach cancer in her maternal grandmother. There is no history of Lung disease.    ROS:  Please see the history of present illness.    Review of Systems: Constitutional:  admits to  , appetite change    HEENT: denies photophobia, eye pain, redness, hearing loss, ear pain, congestion, sore throat, rhinorrhea, sneezing, neck pain, neck stiffness and tinnitus.  Respiratory: admits to SOB, DOE with activity , cough, chest tightness, and wheezing.  Cardiovascular: denies chest pain, palpitations and leg swelling.  Gastrointestinal: admits to nausea, vomiting, abdominal pain,   Genitourinary: denies dysuria, urgency, frequency, hematuria, flank pain and difficulty urinating.  Musculoskeletal: admits to   back pain,     Skin: denies pallor, rash and wound.  Neurological: denies dizziness, seizures, syncope, weakness, light-headedness, numbness and headaches.   Hematological: admits to   easy bruising,   Psychiatric/ Behavioral: denies suicidal ideation, mood changes, confusion, nervousness, sleep disturbance and agitation.       All other systems are reviewed and negative.    PHYSICAL EXAM: VS:  BP 122/52 mmHg  Pulse 42  Ht 5\' 1"  (1.549 m)  Wt 141 lb 1.9 oz (64.012 kg)  BMI 26.68 kg/m2  SpO2 97% , BMI Body mass index is 26.68 kg/(m^2). GEN: Well nourished, well developed, in no acute distress HEENT: normal Neck: no JVD, carotid bruits, or masses Cardiac: Irreg.. Irreg. ; no murmurs, rubs, or gallops,no edema  Respiratory:  clear to auscultation bilaterally, normal work of breathing GI: soft, nontender, nondistended, + BS Brittany: no deformity or  atrophy Skin: warm and dry, no rash Neuro:  Strength and sensation are intact Psych: normal   EKG:  EKG is ordered today. The ekg ordered today demonstrates atrial fibrillation with a heart rate of 52. She has no ST or T wave changes.   Recent Labs: 08/27/2014: ALT 19; B Natriuretic Peptide 367.2* 10/31/2014: BUN 5*; Creatinine, Ser 0.61; Hemoglobin 11.0*; Platelets 311; Potassium 3.7; Sodium 128*    Lipid Panel    Component Value Date/Time   CHOL 159 10/12/2013 0525   TRIG 53 10/12/2013 0525   HDL 62 10/12/2013 0525   CHOLHDL 2.6 10/12/2013 0525   VLDL 11 10/12/2013 0525   LDLCALC 86 10/12/2013 0525      Wt Readings from Last 3 Encounters:  06/18/15 141 lb 1.9 oz (64.012 kg)  06/09/15 142 lb 3.2 oz (64.501 kg)  03/26/15 138 lb (62.596 kg)      Other studies Reviewed: Additional studies/ records that were reviewed today include: records from medical doctor. Review of the above records demonstrates: several months of abdominal pain    ASSESSMENT AND PLAN:  1. Aortic valve replacement - Mrs. Spainhower is doing well. We'll continue the current medications. She's on Coumadin for her atrial fibrillation.  2. Dyslipidemia- continue current dose of atorvastatin.  3. Hypertension- blood pressure is well-controlled.  4. 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  5. Atrial fibrillation- very stable. Continue Coumadin. Her rate is slow but she is stable and not having any syncope.  6. COPD:  Has a chronic cough .  7. CAD:   had a stent placed in July, 2015.  She's been having some nosebleeds and at this point I think that we can safely stop the Plavix. She'll continue with Coumadin I'll see her again in 6 month.  8. Chronic diastolic congestive heart failure. She has 1+ leg edema. She she's not having much shortness breath. She does have grade 2 diastolic dysfunction on echo. We'll try her on Lasix 20 mg and potassium chloride 10 mEq on Mondays, Wednesdays, and  Fridays. We'll have her come back in 3 weeks for basic metabolic profile. I'll see her in 3 months for follow-up office visit.  Current medicines are reviewed at length with the patient today.  The patient does not have concerns regarding medicines.  The following changes have been made:  no change   Disposition:   FU with me in 6 months     Signed, Jahron Hunsinger, Wonda Cheng, MD  06/18/2015 2:31 PM    [facility] [address], [place], [place]  [address] Phone: [phone]; Fax: [phone]

## 2015-06-18 NOTE — Patient Instructions (Signed)
Medication Instructions:  1) START Furosemide 20mg  one tablet on Monday, Wednesday and Friday. 2) START Potassium (KDUR) 59mEq one tablet on Monday, Wednesday and Friday.  Labwork: Your physician recommends that you return for lab work in: 3 weeks (BMET)   Testing/Procedures: None  Follow-Up: Your physician recommends that you schedule a follow-up appointment in: 3 months with Dr. Acie Fredrickson.   Any Other Special Instructions Will Be Listed Below (If Applicable).     If you need a refill on your cardiac medications before your next appointment, please call your pharmacy.

## 2015-06-22 ENCOUNTER — Ambulatory Visit: Payer: Medicare Other | Admitting: Pulmonary Disease

## 2015-06-24 ENCOUNTER — Ambulatory Visit (INDEPENDENT_AMBULATORY_CARE_PROVIDER_SITE_OTHER): Payer: Medicare Other | Admitting: *Deleted

## 2015-06-24 DIAGNOSIS — I482 Chronic atrial fibrillation, unspecified: Secondary | ICD-10-CM

## 2015-06-24 DIAGNOSIS — I480 Paroxysmal atrial fibrillation: Secondary | ICD-10-CM | POA: Diagnosis not present

## 2015-06-24 DIAGNOSIS — I214 Non-ST elevation (NSTEMI) myocardial infarction: Secondary | ICD-10-CM | POA: Diagnosis not present

## 2015-06-24 LAB — POCT INR: INR: 1.5

## 2015-06-29 ENCOUNTER — Telehealth: Payer: Self-pay | Admitting: Cardiovascular Disease

## 2015-06-29 NOTE — Telephone Encounter (Signed)
NEw Msesage  Pt was to call back to speak w/ rN- recently started lasix- pt stated that legs are still swollen and little dizziness- needs to know whether to continue lasix or not. Please call back and discuss.

## 2015-06-29 NOTE — Telephone Encounter (Signed)
Spoke with patient who complains of leg swelling and dizziness.  She was placed on furosemide and kdur at last office visit on 3/9 for 1+ edema in lower extremities.  She is to take these meds on Mon, Wed, and Friday.  States the leg swelling might be a little better since starting the medications.  Complains of moments of dizziness, states it is not constant.  She cannot differentiate between the room spinning and feeling light-headed, states she just doesn't feel comfortable standing.  States BP/pulse at 1149 today are 131/63 mmHg, 59 bpm.  She states she felt particularly dizzy this morning but feels better now.  I advised her to continue furosemide and kdur therapy until blood work is done on 3/30.  I advised that she may take 1/2 lisinopril dose, which would be 20 mg, tomorrow if dizziness persists.  I advised her to call back if dizziness persists or prevents her from doing her regular daily activities or with other questions or concerns.  She verbalized understanding and agreement.

## 2015-06-30 NOTE — Telephone Encounter (Signed)
Agree with note from Michelle Swinyer, RN  

## 2015-07-06 ENCOUNTER — Other Ambulatory Visit (INDEPENDENT_AMBULATORY_CARE_PROVIDER_SITE_OTHER): Payer: Medicare Other

## 2015-07-06 ENCOUNTER — Ambulatory Visit (INDEPENDENT_AMBULATORY_CARE_PROVIDER_SITE_OTHER): Payer: Medicare Other | Admitting: Pharmacist

## 2015-07-06 DIAGNOSIS — I5032 Chronic diastolic (congestive) heart failure: Secondary | ICD-10-CM | POA: Diagnosis not present

## 2015-07-06 DIAGNOSIS — I4891 Unspecified atrial fibrillation: Secondary | ICD-10-CM | POA: Diagnosis not present

## 2015-07-06 DIAGNOSIS — I482 Chronic atrial fibrillation, unspecified: Secondary | ICD-10-CM

## 2015-07-06 DIAGNOSIS — I214 Non-ST elevation (NSTEMI) myocardial infarction: Secondary | ICD-10-CM | POA: Diagnosis not present

## 2015-07-06 DIAGNOSIS — I480 Paroxysmal atrial fibrillation: Secondary | ICD-10-CM | POA: Diagnosis not present

## 2015-07-06 LAB — BASIC METABOLIC PANEL
BUN: 18 mg/dL (ref 7–25)
CHLORIDE: 99 mmol/L (ref 98–110)
CO2: 29 mmol/L (ref 20–31)
CREATININE: 0.92 mg/dL — AB (ref 0.60–0.88)
Calcium: 9 mg/dL (ref 8.6–10.4)
GLUCOSE: 75 mg/dL (ref 65–99)
POTASSIUM: 4.5 mmol/L (ref 3.5–5.3)
Sodium: 135 mmol/L (ref 135–146)

## 2015-07-06 LAB — POCT INR: INR: 2.5

## 2015-07-07 ENCOUNTER — Telehealth: Payer: Self-pay | Admitting: Cardiovascular Disease

## 2015-07-07 DIAGNOSIS — H40013 Open angle with borderline findings, low risk, bilateral: Secondary | ICD-10-CM | POA: Diagnosis not present

## 2015-07-07 DIAGNOSIS — H53461 Homonymous bilateral field defects, right side: Secondary | ICD-10-CM | POA: Diagnosis not present

## 2015-07-07 DIAGNOSIS — H43393 Other vitreous opacities, bilateral: Secondary | ICD-10-CM | POA: Diagnosis not present

## 2015-07-07 DIAGNOSIS — H353122 Nonexudative age-related macular degeneration, left eye, intermediate dry stage: Secondary | ICD-10-CM | POA: Diagnosis not present

## 2015-07-07 DIAGNOSIS — H353112 Nonexudative age-related macular degeneration, right eye, intermediate dry stage: Secondary | ICD-10-CM | POA: Diagnosis not present

## 2015-07-07 DIAGNOSIS — D3131 Benign neoplasm of right choroid: Secondary | ICD-10-CM | POA: Diagnosis not present

## 2015-07-07 MED ORDER — FUROSEMIDE 20 MG PO TABS: ORAL_TABLET | ORAL | Status: DC

## 2015-07-07 MED ORDER — POTASSIUM CHLORIDE ER 10 MEQ PO TBCR: EXTENDED_RELEASE_TABLET | ORAL | Status: DC

## 2015-07-07 NOTE — Telephone Encounter (Signed)
-----   Message from Thayer Headings, MD sent at 07/06/2015  4:27 PM EDT ----- Labs sugggest that she is volume depleted. Decrease lasix to 20 mg twice a week ( Mon, thurs.) And decrease kdur to 10 meq twice a week ( Mon, thurs)

## 2015-07-07 NOTE — Telephone Encounter (Signed)
F/u  Pt returning RN phone call- lab work. Please call back and discuss.   

## 2015-07-07 NOTE — Telephone Encounter (Signed)
Reviewed results and plan of care with patient.  She states she has written down the medication changes.  She states she saw her eye doctor today for c/o blurred vision.  She states they informed her no problems with her eyes, gave her some eye drops for dry eyes, and told her one of her medications could be contributing.  She has a follow-up appointment with them in a few weeks.  I advised her to call back with questions or concerns.  She verbalized understanding and agreement.

## 2015-07-08 DIAGNOSIS — J449 Chronic obstructive pulmonary disease, unspecified: Secondary | ICD-10-CM | POA: Diagnosis not present

## 2015-07-08 DIAGNOSIS — I2510 Atherosclerotic heart disease of native coronary artery without angina pectoris: Secondary | ICD-10-CM | POA: Diagnosis not present

## 2015-07-08 DIAGNOSIS — E782 Mixed hyperlipidemia: Secondary | ICD-10-CM | POA: Diagnosis not present

## 2015-07-08 DIAGNOSIS — I69993 Ataxia following unspecified cerebrovascular disease: Secondary | ICD-10-CM | POA: Diagnosis not present

## 2015-07-08 DIAGNOSIS — I5030 Unspecified diastolic (congestive) heart failure: Secondary | ICD-10-CM | POA: Diagnosis not present

## 2015-07-08 DIAGNOSIS — K5900 Constipation, unspecified: Secondary | ICD-10-CM | POA: Diagnosis not present

## 2015-07-08 DIAGNOSIS — I130 Hypertensive heart and chronic kidney disease with heart failure and stage 1 through stage 4 chronic kidney disease, or unspecified chronic kidney disease: Secondary | ICD-10-CM | POA: Diagnosis not present

## 2015-07-08 DIAGNOSIS — I482 Chronic atrial fibrillation: Secondary | ICD-10-CM | POA: Diagnosis not present

## 2015-07-08 DIAGNOSIS — K219 Gastro-esophageal reflux disease without esophagitis: Secondary | ICD-10-CM | POA: Diagnosis not present

## 2015-07-08 DIAGNOSIS — Z0000 Encounter for general adult medical examination without abnormal findings: Secondary | ICD-10-CM | POA: Diagnosis not present

## 2015-07-08 DIAGNOSIS — N182 Chronic kidney disease, stage 2 (mild): Secondary | ICD-10-CM | POA: Diagnosis not present

## 2015-07-08 DIAGNOSIS — N3010 Interstitial cystitis (chronic) without hematuria: Secondary | ICD-10-CM | POA: Diagnosis not present

## 2015-07-09 ENCOUNTER — Ambulatory Visit: Payer: Medicare Other | Admitting: Gastroenterology

## 2015-07-09 ENCOUNTER — Other Ambulatory Visit: Payer: Medicare Other

## 2015-07-09 ENCOUNTER — Encounter: Payer: Self-pay | Admitting: Cardiovascular Disease

## 2015-07-20 ENCOUNTER — Ambulatory Visit (INDEPENDENT_AMBULATORY_CARE_PROVIDER_SITE_OTHER): Payer: Medicare Other | Admitting: Pharmacist

## 2015-07-20 ENCOUNTER — Other Ambulatory Visit: Payer: Self-pay | Admitting: Physician Assistant

## 2015-07-20 DIAGNOSIS — I214 Non-ST elevation (NSTEMI) myocardial infarction: Secondary | ICD-10-CM

## 2015-07-20 DIAGNOSIS — I480 Paroxysmal atrial fibrillation: Secondary | ICD-10-CM | POA: Diagnosis not present

## 2015-07-20 DIAGNOSIS — I482 Chronic atrial fibrillation, unspecified: Secondary | ICD-10-CM

## 2015-07-20 LAB — POCT INR: INR: 3.1

## 2015-07-21 ENCOUNTER — Other Ambulatory Visit: Payer: Self-pay | Admitting: *Deleted

## 2015-07-21 MED ORDER — PANTOPRAZOLE SODIUM 40 MG PO TBEC: 40.0000 mg | DELAYED_RELEASE_TABLET | Freq: Every day | ORAL | Status: DC

## 2015-07-23 ENCOUNTER — Telehealth: Payer: Self-pay | Admitting: Cardiovascular Disease

## 2015-07-23 ENCOUNTER — Other Ambulatory Visit: Payer: Self-pay | Admitting: *Deleted

## 2015-07-23 MED ORDER — ATENOLOL 25 MG PO TABS: 25.0000 mg | ORAL_TABLET | Freq: Every day | ORAL | Status: DC

## 2015-07-23 NOTE — Telephone Encounter (Signed)
New message     *STAT* If patient is at the pharmacy, call can be transferred to refill team.   1. Which medications need to be refilled? (please list name of each medication and dose if known) atenolol   25 mg   2. Which pharmacy/location (including street and city if local pharmacy) is medication to be sent to? [facility] [address]   3. Do they need a 30 day or 90 day supply? 30 days    Need clarification on direction / dosages

## 2015-07-23 NOTE — Telephone Encounter (Signed)
Follow up      Pt c/o swelling: STAT is pt has developed SOB within 24 hours  1. How long have you been experiencing swelling? month 2. Where is the swelling located? Legs and feet  3.  Are you currently taking a "fluid pill"? yes 4.  Are you currently SOB? no 5.  Have you traveled recently? No  Pt weight has increased  It is 142  Last office visit she was 134

## 2015-07-24 NOTE — Telephone Encounter (Signed)
Patient complaining of weight gain of 3lbs in one week, SOB with exertion, swelling with pitting edema, and pain in feet due to the swelling. Consulted DOD Dr. Curt Bears, he recommend patient take her Lasix 20 mg daily through the weekend, and for her to give our office a call on Monday with an update and for new orders. Patient verbalize understanding.

## 2015-07-24 NOTE — Telephone Encounter (Signed)
Agree with note from [facility] and Dr. Curt Bears

## 2015-07-24 NOTE — Telephone Encounter (Signed)
Follow up   Pt is calling for a rn to return her call from 07-23-15

## 2015-07-25 DIAGNOSIS — M25572 Pain in left ankle and joints of left foot: Secondary | ICD-10-CM | POA: Diagnosis not present

## 2015-07-27 ENCOUNTER — Telehealth: Payer: Self-pay | Admitting: Cardiovascular Disease

## 2015-07-27 DIAGNOSIS — S9032XA Contusion of left foot, initial encounter: Secondary | ICD-10-CM | POA: Diagnosis not present

## 2015-07-27 NOTE — Telephone Encounter (Signed)
Spoke with patient who states she is feeling better today since taking furosemide daily since 4/14.  She states her edema has decreased and her weight is down.  She states she would like to return to taking the furosemide two times per week, on  Tuesdays and Thursdays.  I advised her to call back with questions or concerns prior to next office visit.  She verbalized understanding and agreement.

## 2015-07-27 NOTE — Telephone Encounter (Signed)
Pt calling c/o talking to Pam last week re edema -weight gain -pt on Lasix's on Tues and Thurs-was change to everyday-told to call with fu-weight down 6lbs-edema better

## 2015-07-28 DIAGNOSIS — H04123 Dry eye syndrome of bilateral lacrimal glands: Secondary | ICD-10-CM | POA: Diagnosis not present

## 2015-07-29 ENCOUNTER — Ambulatory Visit (INDEPENDENT_AMBULATORY_CARE_PROVIDER_SITE_OTHER): Payer: Medicare Other | Admitting: *Deleted

## 2015-07-29 DIAGNOSIS — I482 Chronic atrial fibrillation, unspecified: Secondary | ICD-10-CM

## 2015-07-29 DIAGNOSIS — I214 Non-ST elevation (NSTEMI) myocardial infarction: Secondary | ICD-10-CM

## 2015-07-29 DIAGNOSIS — I480 Paroxysmal atrial fibrillation: Secondary | ICD-10-CM

## 2015-07-29 LAB — POCT INR: INR: 2.1

## 2015-08-05 DIAGNOSIS — M75111 Incomplete rotator cuff tear or rupture of right shoulder, not specified as traumatic: Secondary | ICD-10-CM | POA: Diagnosis not present

## 2015-08-12 DIAGNOSIS — R3121 Asymptomatic microscopic hematuria: Secondary | ICD-10-CM | POA: Diagnosis not present

## 2015-08-12 DIAGNOSIS — R3129 Other microscopic hematuria: Secondary | ICD-10-CM | POA: Diagnosis not present

## 2015-08-12 DIAGNOSIS — R351 Nocturia: Secondary | ICD-10-CM | POA: Diagnosis not present

## 2015-08-12 DIAGNOSIS — Z0000 Encounter for general adult medical examination without abnormal findings: Secondary | ICD-10-CM | POA: Diagnosis not present

## 2015-08-12 DIAGNOSIS — N3941 Urge incontinence: Secondary | ICD-10-CM | POA: Diagnosis not present

## 2015-08-12 DIAGNOSIS — N3010 Interstitial cystitis (chronic) without hematuria: Secondary | ICD-10-CM | POA: Diagnosis not present

## 2015-08-19 ENCOUNTER — Encounter (HOSPITAL_COMMUNITY): Payer: Self-pay | Admitting: *Deleted

## 2015-08-19 ENCOUNTER — Ambulatory Visit (INDEPENDENT_AMBULATORY_CARE_PROVIDER_SITE_OTHER): Payer: Medicare Other | Admitting: Internal Medicine

## 2015-08-19 ENCOUNTER — Emergency Department (HOSPITAL_COMMUNITY)
Admission: EM | Admit: 2015-08-19 | Discharge: 2015-08-19 | Disposition: A | Discharge status: Home / Self Care | Payer: Medicare Other | Attending: Emergency Medicine | Admitting: Emergency Medicine

## 2015-08-19 DIAGNOSIS — Z79891 Long term (current) use of opiate analgesic: Secondary | ICD-10-CM | POA: Insufficient documentation

## 2015-08-19 DIAGNOSIS — K219 Gastro-esophageal reflux disease without esophagitis: Secondary | ICD-10-CM | POA: Diagnosis not present

## 2015-08-19 DIAGNOSIS — R040 Epistaxis: Secondary | ICD-10-CM | POA: Insufficient documentation

## 2015-08-19 DIAGNOSIS — M1990 Unspecified osteoarthritis, unspecified site: Secondary | ICD-10-CM | POA: Insufficient documentation

## 2015-08-19 DIAGNOSIS — J342 Deviated nasal septum: Secondary | ICD-10-CM | POA: Diagnosis not present

## 2015-08-19 DIAGNOSIS — Z7901 Long term (current) use of anticoagulants: Secondary | ICD-10-CM | POA: Diagnosis not present

## 2015-08-19 DIAGNOSIS — E785 Hyperlipidemia, unspecified: Secondary | ICD-10-CM | POA: Diagnosis not present

## 2015-08-19 DIAGNOSIS — I10 Essential (primary) hypertension: Secondary | ICD-10-CM | POA: Diagnosis not present

## 2015-08-19 DIAGNOSIS — Z87891 Personal history of nicotine dependence: Secondary | ICD-10-CM | POA: Insufficient documentation

## 2015-08-19 DIAGNOSIS — I482 Chronic atrial fibrillation, unspecified: Secondary | ICD-10-CM

## 2015-08-19 DIAGNOSIS — Z7951 Long term (current) use of inhaled steroids: Secondary | ICD-10-CM | POA: Insufficient documentation

## 2015-08-19 DIAGNOSIS — R030 Elevated blood-pressure reading, without diagnosis of hypertension: Secondary | ICD-10-CM | POA: Diagnosis not present

## 2015-08-19 DIAGNOSIS — I214 Non-ST elevation (NSTEMI) myocardial infarction: Secondary | ICD-10-CM

## 2015-08-19 LAB — CBC WITH DIFFERENTIAL/PLATELET
BASOS PCT: 0 %
Basophils Absolute: 0 10*3/uL (ref 0.0–0.1)
EOS ABS: 0.3 10*3/uL (ref 0.0–0.7)
Eosinophils Relative: 3 %
HEMATOCRIT: 34.6 % — AB (ref 36.0–46.0)
Hemoglobin: 11.7 g/dL — ABNORMAL LOW (ref 12.0–15.0)
LYMPHS ABS: 1.9 10*3/uL (ref 0.7–4.0)
Lymphocytes Relative: 19 %
MCH: 27.5 pg (ref 26.0–34.0)
MCHC: 33.8 g/dL (ref 30.0–36.0)
MCV: 81.4 fL (ref 78.0–100.0)
MONO ABS: 0.9 10*3/uL (ref 0.1–1.0)
Monocytes Relative: 9 %
Neutro Abs: 6.9 10*3/uL (ref 1.7–7.7)
Neutrophils Relative %: 69 %
Platelets: 221 10*3/uL (ref 150–400)
RBC: 4.25 MIL/uL (ref 3.87–5.11)
RDW: 14.9 % (ref 11.5–15.5)
WBC: 10 10*3/uL (ref 4.0–10.5)

## 2015-08-19 LAB — PROTIME-INR
INR: 3.37 — ABNORMAL HIGH (ref 0.00–1.49)
PROTHROMBIN TIME: 33.4 s — AB (ref 11.6–15.2)

## 2015-08-19 MED ORDER — HYDROCODONE-ACETAMINOPHEN 5-325 MG PO TABS
1.0000 | ORAL_TABLET | Freq: Once | ORAL | Status: AC
  Administered 2015-08-19: 1 via ORAL
  Filled 2015-08-19: qty 1

## 2015-08-19 MED ORDER — OXYMETAZOLINE HCL 0.05 % NA SOLN
1.0000 | Freq: Once | NASAL | Status: AC
  Administered 2015-08-19: 1 via NASAL
  Filled 2015-08-19: qty 15

## 2015-08-19 MED ORDER — SILVER NITRATE-POT NITRATE 75-25 % EX MISC: 1.0000 "application " | Freq: Once | CUTANEOUS | Status: DC

## 2015-08-19 MED ORDER — LORAZEPAM 0.5 MG PO TABS
0.5000 mg | ORAL_TABLET | Freq: Once | ORAL | Status: AC
  Administered 2015-08-19: 0.5 mg via ORAL
  Filled 2015-08-19: qty 1

## 2015-08-19 MED ORDER — LIDOCAINE HCL 2 % EX GEL
1.0000 "application " | Freq: Once | CUTANEOUS | Status: AC
  Administered 2015-08-19: 1 via TOPICAL

## 2015-08-19 NOTE — ED Notes (Signed)
MD at bedside. 

## 2015-08-19 NOTE — ED Notes (Signed)
Pt currently getting dressed.  Seeping still noted from L nare.

## 2015-08-19 NOTE — ED Notes (Signed)
Pt's son upset that EMS brought the Pt to [facility] instead of [facility].  Son reassured by this Probation officer and Horton MD that we manage nosebleeds in our ED frequently.    Per Horton MD, Pt will wait in ED until ENT office opens.

## 2015-08-19 NOTE — ED Notes (Signed)
This Probation officer made ENT office aware that the Pt is coming.

## 2015-08-19 NOTE — ED Notes (Signed)
Bed: [id number] Expected date:  Expected time:  Means of arrival:  Comments: Nosebleed 82yr old/coumadin/HTN

## 2015-08-19 NOTE — ED Notes (Addendum)
Pt escorted to discharge window. Verbalized understanding discharge instructions and to go directly to ENT office. In no acute distress.

## 2015-08-19 NOTE — ED Notes (Signed)
Per EMS pt began having nose bleed appx 2 days ago and has been able to control it until tonight. PT reports having had to have cartarizations before to stop bleeding.

## 2015-08-19 NOTE — Discharge Instructions (Signed)
Go straight to the ENT office.  Hold coumadin for 1-2 days.  Nosebleed Nosebleeds are common. They are due to a crack in the inside lining of your nose (mucous membrane) or from a small blood vessel that starts to bleed. Nosebleeds can be caused by many conditions, such as injury, infections, dry mucous membranes or dry climate, medicines, nose picking, and home heating and cooling systems. Most nosebleeds come from blood vessels in the front of your nose. HOME CARE INSTRUCTIONS   Try controlling your nosebleed by pinching your nostrils gently and continuously for at least 10 minutes.  Avoid blowing or sniffing your nose for a number of hours after having a nosebleed.  Do not put gauze inside your nose yourself. If your nose was packed by your health care provider, try to maintain the pack inside of your nose until your health care provider removes it.  If a gauze pack was used and it starts to fall out, gently replace it or cut off the end of it.  If a balloon catheter was used to pack your nose, do not cut or remove it unless your health care provider has instructed you to do that.  Avoid lying down while you are having a nosebleed. Sit up and lean forward.  Use a nasal spray decongestant to help with a nosebleed as directed by your health care provider.  Do not use petroleum jelly or mineral oil in your nose. These can drip into your lungs.  Maintain humidity in your home by using less air conditioning or by using a humidifier.  Aspirinand blood thinners make bleeding more likely. If you are prescribed these medicines and you suffer from nosebleeds, ask your health care provider if you should stop taking the medicines or adjust the dose. Do not stop medicines unless directed by your health care provider  Resume your normal activities as you are able, but avoid straining, lifting, or bending at the waist for several days.  If your nosebleed was caused by dry mucous membranes, use  over-the-counter saline nasal spray or gel. This will keep the mucous membranes moist and allow them to heal. If you must use a lubricant, choose the water-soluble variety. Use it only sparingly, and do not use it within several hours of lying down.  Keep all follow-up visits as directed by your health care provider. This is important. SEEK MEDICAL CARE IF:  You have a fever.  You get frequent nosebleeds.  You are getting nosebleeds more often. SEEK IMMEDIATE MEDICAL CARE IF:  Your nosebleed lasts longer than 20 minutes.  Your nosebleed occurs after an injury to your face, and your nose looks crooked or broken.  You have unusual bleeding from other parts of your body.  You have unusual bruising on other parts of your body.  You feel light-headed or you faint.  You become sweaty.  You vomit blood.  Your nosebleed occurs after a head injury.   This information is not intended to replace advice given to you by your health care provider. Make sure you discuss any questions you have with your health care provider.   Document Released: 01/05/2005 Document Revised: 04/18/2014 Document Reviewed: 11/11/2013 Elsevier Interactive Patient Education [employer].

## 2015-08-19 NOTE — ED Provider Notes (Signed)
CSN: [id number]     Arrival date & time 08/19/15  0440 History   First MD Initiated Contact with Patient 08/19/15 0445     Chief Complaint  Patient presents with  . Epistaxis     (Consider location/radiation/quality/duration/timing/severity/associated sxs/prior Treatment) HPI  This is an 82 rolled female with history of hyperlipidemia, aortic valve replacement, chronic atrial fibrillation who presents with epistaxis. Patient reports bleeding from the left nasal air since 8 PM yesterday. She states that has been constant and progressive. She states "there is blood everywhere." She is on Coumadin. She reports a history of "having to have a cauterization before." Reports Dr. Redmond Baseman is her ENT doctor. Denies dizziness, chest pain, shortness of breath.  Past Medical History  Diagnosis Date  . Dyslipidemia     takes Lipitor daily  . Arthritis   . Urine incontinence   . Diverticulosis   . IC (interstitial cystitis)   . S/P aortic valve replacement     2005  . H/O hiatal hernia   . History of TIA (transient ischemic attack)     2013-- RESIDUAL PERIPHERAL VISION RIGHT EYE--  RESOLVED  . History of bacterial endocarditis   . Foot drop     SINCE 1987  . History of gastritis   . Chronic back pain   . Hypertension   . Atrial fibrillation ([place])   . GERD (gastroesophageal reflux disease)   . Hearing impaired     Bilateral hearing aids  . History of stomach ulcers     many yrs ago  . Dysrhythmia     Atrial Fibrillation  . Pulmonary hypertension ([address]) 10/2013    Based on TTE   Past Surgical History  Procedure Laterality Date  . Abdominal hysterectomy  1967  . Aortic valve replacement  09-29-2003     [facility] pericardial tissue valve  . Cholecystectomy  1993  . Vein ligation  1969    RIGHT LOWER LEG  . Dilation and curettage of uterus    . Ercp N/A 08/10/2012    Procedure: ENDOSCOPIC RETROGRADE CHOLANGIOPANCREATOGRAPHY (ERCP);  Surgeon: Inda Castle, MD;  Location: [place];  Service: Gastroenterology;  Laterality: N/A;  . Cataract extraction w/ intraocular lens  implant, bilateral    . Appendectomy  1933  . Cardiac catheterization  07-15-2003 DR HELEN PRESTON    MODERATE TO MODERATELY SEVERE CALCIFIC AORTIC STENOSIS/  NORMAL CORONARY ARTERIES  . Right shoulder arthroscopy w/ debridement rotator cuff and labral tear/ acrominoplasty/ distal clavicle excision/ ca ligament release  10-08-1999  . Cysto/ hydrodistention/ instillation clorpactin  MULTIPLE  last one 2009  . Mini-open right rotator cuff repair  07-12-2001  . Transvaginal tape procedure  07-30-2002  . Lumbar disc surgery  1985 &  2007  . Macroplastique urethral implantation  08-27-2009  . Transthoracic echocardiogram  08-10-2011    MILD LVH/  EF 55-60%/  NORMAL AVR TISSUE/ MILD MR/  MODERATE DILATED LA/  MILD DILATED RA  . Cardiovascular stress test  01-30-2012  DR NASHER    NORMAL NUCLEAR STUDY/  EF 73%/  NORMAL LVF  . Tonsillectomy and adenoidectomy    . Carpal tunnel release Bilateral   . Cysto with hydrodistension N/A 02/05/2013    Procedure: CYSTOSCOPY/HYDRODISTENSION;  Surgeon: Irine Seal, MD;  Location: [facility];  Service: Urology;  Laterality: N/A;  . Left heart catheterization with coronary angiogram N/A 10/18/2013    Procedure: LEFT HEART CATHETERIZATION WITH CORONARY ANGIOGRAM;  Surgeon: Jettie Booze, MD;  Location: [place] CATH LAB;  Service: Cardiovascular;  Laterality: N/A;  . Esophagogastroduodenoscopy (egd) with propofol N/A 08/18/2014    Procedure: ESOPHAGOGASTRODUODENOSCOPY (EGD) WITH PROPOFOL;  Surgeon: Inda Castle, MD;  Location: [facility];  Service: Endoscopy;  Laterality: N/A;   Family History  Problem Relation Age of Onset  . Heart disease Father   . Coronary artery disease Brother   . Bipolar disorder Brother     commited suiside at 52yo  . Alcohol abuse Sister     sister #1  . Alcohol abuse Sister     sister #2  . Colon cancer Maternal Aunt    . Stomach cancer Maternal Grandmother   . Lung disease Neg Hx   . Esophageal cancer Maternal Grandfather   . Bipolar disorder Son     Committed Suicide   Social History  Substance Use Topics  . Smoking status: Former Smoker -- 0.50 packs/day for 40 years    Types: Cigarettes    Start date: 05/06/1949    Quit date: 03/06/1990  . Smokeless tobacco: Never Used  . Alcohol Use: No     Comment: Remote social EtOH   OB History    No data available     Review of Systems  HENT: Positive for nosebleeds.   Respiratory: Negative for shortness of breath.   Cardiovascular: Negative for chest pain.  Gastrointestinal: Positive for nausea.  All other systems reviewed and are negative.     Allergies  Amlodipine; Amoxicillin; Carafate; Cephalexin; Codeine; Irbesartan; Morphine and related; Neurontin; Nitrofurantoin monohyd macro; and Sulfa antibiotics  Home Medications   Prior to Admission medications   Medication Sig Start Date End Date Taking? Authorizing Provider  atenolol (TENORMIN) 25 MG tablet Take 1 tablet (25 mg total) by mouth daily. 07/23/15   Thayer Headings, MD  atorvastatin (LIPITOR) 20 MG tablet Take 20 mg by mouth daily.    Historical Provider, MD  diazepam (VALIUM) 5 MG tablet Take 5 mg by mouth once a week. 06/16/15   Historical Provider, MD  diltiazem (CARTIA XT) 120 MG 24 hr capsule Take 120 mg by mouth 2 (two) times daily.    Historical Provider, MD  furosemide (LASIX) 20 MG tablet Take one tablet by mouth on Mondays and Thursdays 07/07/15   Thayer Headings, MD  HYDROcodone-acetaminophen (NORCO/VICODIN) 5-325 MG per tablet Take 1 tablet by mouth every 6 (six) hours as needed. 12/17/14   Historical Provider, MD  hyoscyamine (LEVSIN, ANASPAZ) 0.125 MG tablet Take 0.125 mg by mouth every 4 (four) hours as needed.    Historical Provider, MD  ipratropium (ATROVENT) 0.06 % nasal spray 1-2 sprays each nostril twice daily 03/06/15   Historical Provider, MD  lisinopril  (PRINIVIL,ZESTRIL) 40 MG tablet Take 20 mg by mouth daily.  06/17/15   Historical Provider, MD  LORazepam (ATIVAN) 0.5 MG tablet TAKE 1 TABLET BY MOUTH TWICE DAILY 05/11/15   Amy S Esterwood, PA-C  nystatin (MYCOSTATIN) 100000 UNIT/ML suspension Take 5 mLs by mouth 3 (three) times daily.    Historical Provider, MD  ondansetron (ZOFRAN) 4 MG tablet Take 4 mg by mouth 2 (two) times daily as needed for nausea or vomiting (nausea/vomiting).    Historical Provider, MD  oxybutynin (DITROPAN) 5 MG tablet Take 5 mg by mouth 2 (two) times daily.    Historical Provider, MD  pantoprazole (PROTONIX) 40 MG tablet Take 1 tablet (40 mg total) by mouth daily. 07/21/15   Manus Gunning, MD  potassium chloride (K-DUR) 10 MEQ tablet  Take one tablet by mouth on Mondays and Thursdays 07/07/15   Thayer Headings, MD  [facility] HFA 108 (90 BASE) MCG/ACT inhaler 2 puffs every 4 (four) hours as needed for wheezing.  11/05/14   Historical Provider, MD  promethazine (PHENERGAN) 12.5 MG tablet Take 1 tablet (12.5 mg total) by mouth every 6 (six) hours as needed for nausea or vomiting. 08/30/14   Jonetta Osgood, MD  ranitidine (ZANTAC) 150 MG tablet Take 150 mg by mouth at bedtime.    Historical Provider, MD  warfarin (COUMADIN) 5 MG tablet Take as directed by [facility] Clinic 05/18/15   Thayer Headings, MD   BP 162/65 mmHg  Pulse 59  Temp(Src) 97.7 F (36.5 C)  Resp 16  Ht 5\' 1"  (1.549 m)  Wt 134 lb (60.782 kg)  BMI 25.33 kg/m2  SpO2 98% Physical Exam  Constitutional: She is oriented to person, place, and time. No distress.  Elderly, no acute distress  HENT:  Head: Normocephalic and atraumatic.  Oozing and bleeding noted from the left nare, blood noted posterior oropharynx  Eyes: Pupils are equal, round, and reactive to light.  Cardiovascular: Normal rate and normal heart sounds.   Irregular rhythm  Pulmonary/Chest: Effort normal and breath sounds normal. No respiratory distress. She has no wheezes.  Neurological:  She is alert and oriented to person, place, and time.  Skin: Skin is warm and dry.  Psychiatric: She has a normal mood and affect.  Nursing note and vitals reviewed.   ED Course  .Epistaxis Management Date/Time: 08/19/2015 7:48 AM Performed by: Merryl Hacker Authorized by: Merryl Hacker Consent: Verbal consent obtained. Risks and benefits: risks, benefits and alternatives were discussed Consent given by: patient Patient understanding: patient states understanding of the procedure being performed Patient consent: the patient's understanding of the procedure matches consent given Patient identity confirmed: verbally with patient Local anesthetic: topical anesthetic Patient sedated: no Treatment site: left posterior Repair method: suction, silver nitrate, merocel sponge, anterior pack and nasal balloon Post-procedure assessment: bleeding decreased Treatment complexity: complex Recurrence: recurrence of recent bleed Patient tolerance: Patient tolerated the procedure well with no immediate complications Comments: Initially clots were blown from the nare, Afrin was sprayed in the bilateral nares, however, isn't continued. Unable to visualize culprit bleeding.  Initially a Rhino Rocket was use to [place] nonbleeding. This initially slowed the bleeding down but patient continued to have fairly brisk bleeding anteriorly and in the posterior oropharynx. This was removed and a large Merocel was placed. Again, temporary tamponade was achieved; however, patient developed bleeding and subsequently bled from her left eye.  Lastly, a 7.5 anterior/posterior rapid Rhino was placed. Approximately 15 mL of air was insufflated. This seemed to tamponade the bleeding.      CRITICAL CARE Performed by: Merryl Hacker   Total critical care time: 35 minutes  Critical care time was exclusive of separately billable procedures and treating other patients.  Critical care was necessary to treat or  prevent imminent or life-threatening deterioration.  Critical care was time spent personally by me on the following activities: development of treatment plan with patient and/or surrogate as well as nursing, discussions with consultants, evaluation of patient's response to treatment, examination of patient, obtaining history from patient or surrogate, ordering and performing treatments and interventions, ordering and review of laboratory studies, ordering and review of radiographic studies, pulse oximetry and re-evaluation of patient's condition.  (including critical care time) Labs Review Labs Reviewed  PROTIME-INR - Abnormal; Notable for the  following:    Prothrombin Time 33.4 (*)    INR 3.37 (*)    All other components within normal limits  CBC WITH DIFFERENTIAL/PLATELET - Abnormal; Notable for the following:    Hemoglobin 11.7 (*)    HCT 34.6 (*)    All other components within normal limits    Imaging Review No results found. I have personally reviewed and evaluated these images and lab results as part of my medical decision-making.   EKG Interpretation None      MDM   Final diagnoses:  Epistaxis    Patient presents with a left likely anterior nosebleed. She is on Coumadin. INR is 3.3.  Goal 2.5.  Hemoglobin is stable.  Patient was packed multiple times. Finally appeared to achieve Tamponade with a rapid Rhino.  ?Mild continued anterior seeping.  Discussed with Dr. Erik Obey. Patient to follow-up with the office later today for definitive management. Will be observed in the ER until discharge.  Patient is stable and I have assured the patient and her son that ENT has appropriate equipment in office to manage and patient is stable to be evaluated in clinic.   Also, hold coumadin for 1-2 days given supratherapeutic INR.  Of note, patient did have several episodes of dark stools while in the ER. This is likely related to swallowing a large amount of blood. Introduced the patient to  Dr. Audie Pinto who will monitor until d/c.     Merryl Hacker, MD 08/19/15 [phone]

## 2015-08-25 ENCOUNTER — Ambulatory Visit (INDEPENDENT_AMBULATORY_CARE_PROVIDER_SITE_OTHER): Payer: Medicare Other | Admitting: *Deleted

## 2015-08-25 DIAGNOSIS — I482 Chronic atrial fibrillation, unspecified: Secondary | ICD-10-CM

## 2015-08-25 DIAGNOSIS — I214 Non-ST elevation (NSTEMI) myocardial infarction: Secondary | ICD-10-CM | POA: Diagnosis not present

## 2015-08-25 DIAGNOSIS — I480 Paroxysmal atrial fibrillation: Secondary | ICD-10-CM | POA: Diagnosis not present

## 2015-08-25 LAB — POCT INR: INR: 2.2

## 2015-08-31 DIAGNOSIS — H353112 Nonexudative age-related macular degeneration, right eye, intermediate dry stage: Secondary | ICD-10-CM | POA: Diagnosis not present

## 2015-08-31 DIAGNOSIS — H04123 Dry eye syndrome of bilateral lacrimal glands: Secondary | ICD-10-CM | POA: Diagnosis not present

## 2015-08-31 DIAGNOSIS — H353122 Nonexudative age-related macular degeneration, left eye, intermediate dry stage: Secondary | ICD-10-CM | POA: Diagnosis not present

## 2015-09-01 ENCOUNTER — Ambulatory Visit (INDEPENDENT_AMBULATORY_CARE_PROVIDER_SITE_OTHER): Payer: Medicare Other | Admitting: Gastroenterology

## 2015-09-01 ENCOUNTER — Encounter: Payer: Self-pay | Admitting: Gastroenterology

## 2015-09-01 VITALS — BP 110/60 | HR 60 | Ht 61.0 in | Wt 134.6 lb

## 2015-09-01 DIAGNOSIS — M4806 Spinal stenosis, lumbar region: Secondary | ICD-10-CM | POA: Diagnosis not present

## 2015-09-01 DIAGNOSIS — R1013 Epigastric pain: Secondary | ICD-10-CM

## 2015-09-01 DIAGNOSIS — R110 Nausea: Secondary | ICD-10-CM | POA: Diagnosis not present

## 2015-09-01 DIAGNOSIS — F1190 Opioid use, unspecified, uncomplicated: Secondary | ICD-10-CM

## 2015-09-01 DIAGNOSIS — R932 Abnormal findings on diagnostic imaging of liver and biliary tract: Secondary | ICD-10-CM

## 2015-09-01 DIAGNOSIS — I214 Non-ST elevation (NSTEMI) myocardial infarction: Secondary | ICD-10-CM

## 2015-09-01 DIAGNOSIS — M545 Low back pain: Secondary | ICD-10-CM | POA: Diagnosis not present

## 2015-09-01 DIAGNOSIS — M5416 Radiculopathy, lumbar region: Secondary | ICD-10-CM | POA: Diagnosis not present

## 2015-09-01 DIAGNOSIS — M542 Cervicalgia: Secondary | ICD-10-CM | POA: Diagnosis not present

## 2015-09-01 NOTE — Progress Notes (Signed)
HPI :  82 y/o female here to establish care with me, previously followed by Dr. Deatra Ina, for history of chronic nausea and weight loss. She has multiple medical problems including history of aortic stenosis status post aortic valve replacement, chronic diastolic heart failure, hypertension, atrial fibrillation, interstitial cystitis, history of TIA, and coronary artery disease status post MI. She is maintained on chronic Coumadin.  She has had the following evaluation for her symptoms over the past year, which have started since Jan 2016, as outlined: CT scan of the abdomen and pelvis in March 2016 she was noted to have atherosclerotic changes of the SMA and celiac but no progress since 2013 EGD 08/18/2014 - erythematous mucosa of the stomach, reactive changes with GIM, H pylori negative Prior barium study showed some mild dysmotility.  CT head - medial left occipital lobe infarct with encephalomalacia. MRI head done in 2015.   Patient reports for several months she had severe nausea, poor appetite. She has had an extensive workup for this issue as above. Over time this has improved however she continues to eat small amounts and feel full easily. She eats because she has to eat. She had lost weight about this - she thinks she weighed 189 lbs 5 years ago, and now 134 lbs. However he weight has been stable over the past several months. She reports during this time her husband was sick which correlated with the onset of her symptoms. She thinks her weight has plateaud. If she doesn't eat anything she will feel nauseated, she takes ativan and zofran PRN. She states this has helped. She reports she doesn't vomit at all. She reports today she is feeling well without complaints. She sees pain management for chronic back pain, she takes hydrocodone about twice daily, for which she has been on for a long time. She has a history of back surgery. She takes protonix 40mg  daily. She avoids NSAIDs. She has some  occasional epigastric soreness, which comes and goes. It is mild and resolves on its own. She denies any postprandial relationship.   Of note, the patient has a nodular appearing liver on imaging concerning for possible cirrhosis. Last imaged in May 2016. Prior EGD did not show varices. Platelets are normal.   Past Medical History  Diagnosis Date  . Dyslipidemia     takes Lipitor daily  . Arthritis   . Urine incontinence   . Diverticulosis   . IC (interstitial cystitis)   . S/P aortic valve replacement     2005  . H/O hiatal hernia   . History of TIA (transient ischemic attack)     2013-- RESIDUAL PERIPHERAL VISION RIGHT EYE--  RESOLVED  . History of bacterial endocarditis   . Foot drop     SINCE 1987  . History of gastritis   . Chronic back pain   . Hypertension   . Atrial fibrillation ([place])   . GERD (gastroesophageal reflux disease)   . Hearing impaired     Bilateral hearing aids  . History of stomach ulcers     many yrs ago  . Dysrhythmia     Atrial Fibrillation  . Pulmonary hypertension ([place]) 10/2013    Based on TTE     Past Surgical History  Procedure Laterality Date  . Abdominal hysterectomy  1967  . Aortic valve replacement  09-29-2003     [facility] pericardial tissue valve  . Cholecystectomy  1993  . Vein ligation  1969    RIGHT LOWER LEG  .  Dilation and curettage of uterus    . Ercp N/A 08/10/2012    Procedure: ENDOSCOPIC RETROGRADE CHOLANGIOPANCREATOGRAPHY (ERCP);  Surgeon: Inda Castle, MD;  Location: [place];  Service: Gastroenterology;  Laterality: N/A;  . Cataract extraction w/ intraocular lens  implant, bilateral    . Appendectomy  1933  . Cardiac catheterization  07-15-2003 DR HELEN PRESTON    MODERATE TO MODERATELY SEVERE CALCIFIC AORTIC STENOSIS/  NORMAL CORONARY ARTERIES  . Right shoulder arthroscopy w/ debridement rotator cuff and labral tear/ acrominoplasty/ distal clavicle excision/ ca ligament release  10-08-1999  . Cysto/  hydrodistention/ instillation clorpactin  MULTIPLE  last one 2009  . Mini-open right rotator cuff repair  07-12-2001  . Transvaginal tape procedure  07-30-2002  . Lumbar disc surgery  1985 &  2007  . Macroplastique urethral implantation  08-27-2009  . Transthoracic echocardiogram  08-10-2011    MILD LVH/  EF 55-60%/  NORMAL AVR TISSUE/ MILD MR/  MODERATE DILATED LA/  MILD DILATED RA  . Cardiovascular stress test  01-30-2012  DR NASHER    NORMAL NUCLEAR STUDY/  EF 73%/  NORMAL LVF  . Tonsillectomy and adenoidectomy    . Carpal tunnel release Bilateral   . Cysto with hydrodistension N/A 02/05/2013    Procedure: CYSTOSCOPY/HYDRODISTENSION;  Surgeon: Irine Seal, MD;  Location: [facility];  Service: Urology;  Laterality: N/A;  . Left heart catheterization with coronary angiogram N/A 10/18/2013    Procedure: LEFT HEART CATHETERIZATION WITH CORONARY ANGIOGRAM;  Surgeon: Jettie Booze, MD;  Location: [facility] CATH LAB;  Service: Cardiovascular;  Laterality: N/A;  . Esophagogastroduodenoscopy (egd) with propofol N/A 08/18/2014    Procedure: ESOPHAGOGASTRODUODENOSCOPY (EGD) WITH PROPOFOL;  Surgeon: Inda Castle, MD;  Location: [facility];  Service: Endoscopy;  Laterality: N/A;   Family History  Problem Relation Age of Onset  . Heart disease Father   . Coronary artery disease Brother   . Bipolar disorder Brother     commited suiside at 6yo  . Alcohol abuse Sister     sister #1  . Alcohol abuse Sister     sister #2  . Colon cancer Maternal Aunt   . Stomach cancer Maternal Grandmother   . Lung disease Neg Hx   . Esophageal cancer Maternal Grandfather   . Bipolar disorder Son     Committed Suicide   Social History  Substance Use Topics  . Smoking status: Former Smoker -- 0.50 packs/day for 40 years    Types: Cigarettes    Start date: 05/06/1949    Quit date: 03/06/1990  . Smokeless tobacco: Never Used  . Alcohol Use: No     Comment: Remote social EtOH   Current  Outpatient Prescriptions  Medication Sig Dispense Refill  . atenolol (TENORMIN) 25 MG tablet Take 1 tablet (25 mg total) by mouth daily. 90 tablet 3  . atorvastatin (LIPITOR) 20 MG tablet Take 20 mg by mouth daily.    . diazepam (VALIUM) 5 MG tablet Take 5 mg by mouth once a week.    . diltiazem (CARTIA XT) 120 MG 24 hr capsule Take 120 mg by mouth 2 (two) times daily.    . furosemide (LASIX) 20 MG tablet Take one tablet by mouth on Mondays and Thursdays (Patient taking differently: Take one tablet by mouth on Tuesday and Thursdays) 45 tablet 3  . HYDROcodone-acetaminophen (NORCO/VICODIN) 5-325 MG per tablet Take 1 tablet by mouth every 6 (six) hours as needed.    . hyoscyamine (LEVSIN, ANASPAZ) 0.125 MG  tablet Take 0.125 mg by mouth every 4 (four) hours as needed.    Marland Kitchen ipratropium (ATROVENT) 0.06 % nasal spray 1-2 sprays each nostril twice daily    . lisinopril (PRINIVIL,ZESTRIL) 40 MG tablet Take 20 mg by mouth daily.     Marland Kitchen LORazepam (ATIVAN) 0.5 MG tablet TAKE 1 TABLET BY MOUTH TWICE DAILY 60 tablet 2  . nystatin (MYCOSTATIN) 100000 UNIT/ML suspension Take 5 mLs by mouth 3 (three) times daily.    . ondansetron (ZOFRAN) 4 MG tablet Take 4 mg by mouth 2 (two) times daily as needed for nausea or vomiting (nausea/vomiting).    Marland Kitchen oxybutynin (DITROPAN) 5 MG tablet Take 5 mg by mouth 2 (two) times daily.    . pantoprazole (PROTONIX) 40 MG tablet Take 1 tablet (40 mg total) by mouth daily. 30 tablet 1  . potassium chloride (K-DUR) 10 MEQ tablet Take one tablet by mouth on Mondays and Thursdays (Patient taking differently: Take one tablet by mouth on Tuesdays and Thursdays) 45 tablet 3  . promethazine (PHENERGAN) 12.5 MG tablet Take 1 tablet (12.5 mg total) by mouth every 6 (six) hours as needed for nausea or vomiting. 30 tablet 0  . ranitidine (ZANTAC) 150 MG tablet Take 150 mg by mouth at bedtime.    Marland Kitchen warfarin (COUMADIN) 5 MG tablet Take as directed by [facility]madin Clinic 30 tablet 2   No current  facility-administered medications for this visit.   Allergies  Allergen Reactions  . Amlodipine     Other reaction(s): edema  . Amoxicillin Diarrhea and Nausea And Vomiting  . Carafate [Sucralfate] Swelling    Swelling in knees and blocky red marks on them.  . Cephalexin Diarrhea  . Codeine Nausea Only  . Irbesartan Swelling    Facial and hand numbness and swelling. Patient believes reaction was with this medication but isn't 100% sure. She is currently tolerating lisinopril.  Marland Kitchen Morphine And Related Other (See Comments)    HX ADDICTION / WITHDRAWAL  . Neurontin [Gabapentin] Swelling    Legs swell  . Nitrofurantoin Monohyd Macro Diarrhea and Nausea Only  . Sulfa Antibiotics Rash     Review of Systems: All systems reviewed and negative except where noted in HPI.   Lab Results  Component Value Date   WBC 10.0 08/19/2015   HGB 11.7* 08/19/2015   HCT 34.6* 08/19/2015   MCV 81.4 08/19/2015   PLT 221 08/19/2015    Lab Results  Component Value Date   CREATININE 0.92* 07/06/2015   BUN 18 07/06/2015   NA 135 07/06/2015   K 4.5 07/06/2015   CL 99 07/06/2015   CO2 29 07/06/2015    Lab Results  Component Value Date   ALT 19 08/27/2014   AST 27 08/27/2014   ALKPHOS 70 08/27/2014   BILITOT 0.7 08/27/2014     Physical Exam: BP 110/60 mmHg  Pulse 60  Ht 5\' 1"  (1.549 m)  Wt 134 lb 9.6 oz (61.054 kg)  BMI 25.45 kg/m2 Constitutional: Pleasant, thin appearing female in no acute distress. HEENT: Normocephalic and atraumatic. Conjunctivae are normal. No scleral icterus. Cardiovascular: Normal rate, regular rhythm.  Pulmonary/chest: Effort normal and breath sounds normal. No wheezing, rales or rhonchi. Abdominal: Soft, nondistended, nontender. Bowel sounds active throughout. There are no masses palpable.  Extremities: no edema, brace on left foot Lymphadenopathy: No cervical adenopathy noted. Neurological: Alert and oriented to person place and time. Skin: Skin is warm and  dry. No rashes noted. Psychiatric: Normal mood and affect. Behavior is  normal.   ASSESSMENT AND PLAN: 82 y/o female with chronic nausea and dyspepsia for almost a year and a half now, with evaluation as outlined above. She reports being significantly improved now compared to previous, but continues to have some postprandial dyspepsia and mild nausea. I reassured her I don't see any evidence of malignancy on her imaging. She has some stable vascular changes on CT but doesn't have much of any abdominal pain suggestive of mesenteric angina.   She is on multiple medications, including chronic narcotics, and I suspect she is having chronic nausea related to polypharmacy, most likely due to narcotics. While her gastric emptying study is normal, she could still have a component of narcotic bowel. I discussed this with her, and she feels this is the only pain regimen capable of controlling her chronic pain which is severe. I am concerned about her longstanding ativan and benzodiazepine use in the setting of chronic narcotics and would recommend avoiding combining these at her age, and minimize its use.   Otherwise, I discussed options for management of her symptoms. Remeron would be useful to treat nausea and help regain weight but it should not be used with narcotics / benzodiazepines so will avoid this for now. We also discussed using buspirone to help with gastric accomodation, or trial of FD guard for dyspepsia. She wishes to try FD gard first, it no benefit will consider buspirone if not contraindications. She can follow up as needed, minimize narcotic use. She can continue zofran PRN.  Of note, imaging previously showed possibly cirrhotic liver. Prior EGD showed no varices, she has no known liver disease, platelet count is normal. I would recommend an [place] with elastography to help clarify if she has cirrhosis. We will let her know the results.    Dennison Cellar, MD [facility] Gastroenterology Pager  [phone]

## 2015-09-01 NOTE — Patient Instructions (Signed)
We have given you samples of FD Donald [place]se and a coupon. You can get this at, [facility], [facility].Marland Kitchen

## 2015-09-08 IMAGING — RF DG SWALLOWING FUNCTION
2 series · 2 of 2 positions shown · non-contrast
Comparison: Chest radiograph 10/31/2014

CLINICAL DATA: Dysphagia

EXAM:
MODIFIED BARIUM SWALLOW
TECHNIQUE: Different consistencies of barium were administered orally to the
patient by the Speech Pathologist. Imaging of the pharynx was
performed in the lateral projection.
FLUOROSCOPY TIME:  Fluoroscopy Time:  1 minutes, 3 seconds

[Series 1: run · 1 of 1 slices shown (1 of 2)]
[im 1/1]
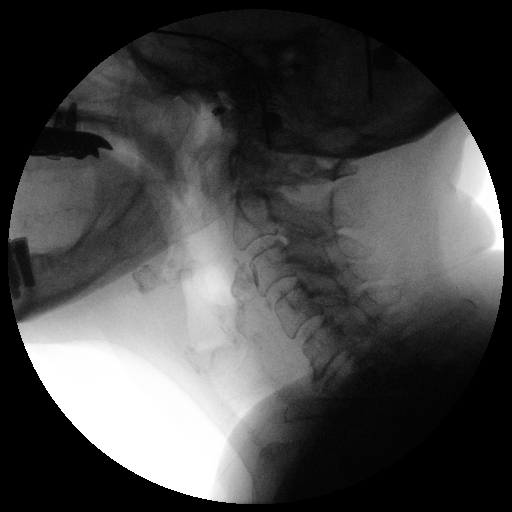

[Series 1: run · 1 of 1 slices shown (2 of 2)]
[im 1/1]
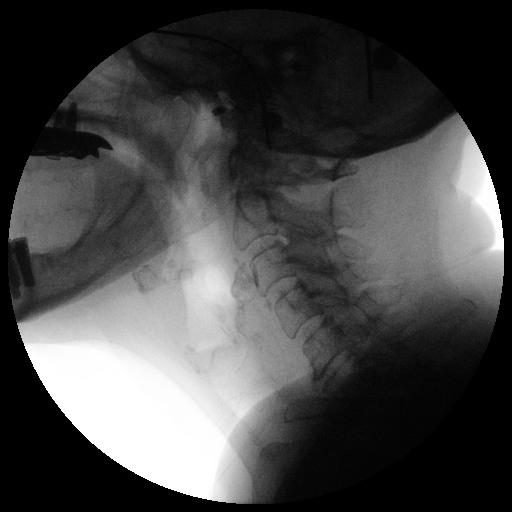

[2 of 2 positions shown; findings below may reference images not displayed]

FINDINGS: Thin liquid- within normal limits

Nectar thick liquid- within normal limits

Honey- within normal limits

Wickrama?Evrade with cracker- No evidence for penetration or aspiration.
Retention demonstrated after swallowing. This was relieved with thin
liquids.

Barium tablet -  within normal limits
IMPRESSION: No evidence for aspiration or penetration.

Please refer to the Speech Pathologists report for complete details
and recommendations.

## 2015-09-14 ENCOUNTER — Other Ambulatory Visit: Payer: Self-pay | Admitting: Physician Assistant

## 2015-09-15 ENCOUNTER — Ambulatory Visit (INDEPENDENT_AMBULATORY_CARE_PROVIDER_SITE_OTHER): Payer: Medicare Other | Admitting: *Deleted

## 2015-09-15 DIAGNOSIS — I482 Chronic atrial fibrillation, unspecified: Secondary | ICD-10-CM

## 2015-09-15 DIAGNOSIS — I480 Paroxysmal atrial fibrillation: Secondary | ICD-10-CM

## 2015-09-15 DIAGNOSIS — I214 Non-ST elevation (NSTEMI) myocardial infarction: Secondary | ICD-10-CM | POA: Diagnosis not present

## 2015-09-15 LAB — POCT INR: INR: 2.4

## 2015-09-15 NOTE — Telephone Encounter (Signed)
Patient saw Nicoletta Ba PA 09/2014 and she gave patient script for Ativan. Refill requested today.

## 2015-09-16 NOTE — Telephone Encounter (Signed)
FYI if this patient is going to run out completely and takes this daily, I will give her an emergency refill for 2 weeks worth so she can see her primary care. If she only takes it as needed and rarely, then she should not need this. Thanks

## 2015-09-16 NOTE — Telephone Encounter (Signed)
No I don't prescribe Ativan, in fact, I think she has polypharmacy with narcotics and benzodiazepines and have recommended she come off of this altogether, especially at her age. She will need to see her primary care if she wishes to have this but I don't recommend she continue it.

## 2015-09-16 NOTE — Telephone Encounter (Signed)
Advised the pharmacist at [facility] that if she needs it daily we will prescribe it for 2 weeks. If she takes it as needed then we think she should call her primary care MD to get this medication.

## 2015-09-17 ENCOUNTER — Telehealth: Payer: Self-pay | Admitting: Gastroenterology

## 2015-09-17 NOTE — Telephone Encounter (Signed)
Called the patient and asked her if she was out of the Ativan or used it daily. She said she is not out of it at this time. She only takes it as needed. I told her Dr. Havery Moros said if she was out or takes it daily he would give her en emergency script for 2  weeks worth.  I advised that Dr. Havery Moros doesn't feel he should refill this and she can call her primary care physician.  I also advised Amy Esterwood PA said she would not refill it. She asked why she cannot get this at our gastro office. I told her we normally do not prescribe Ativan unless the situation  warrents it. Some of our patients  deal with  illnesses and anxiety and we will temporarily prescribe Ativan  to help.

## 2015-09-21 ENCOUNTER — Encounter (INDEPENDENT_AMBULATORY_CARE_PROVIDER_SITE_OTHER): Payer: Medicare Other | Admitting: Ophthalmology

## 2015-09-21 DIAGNOSIS — H35033 Hypertensive retinopathy, bilateral: Secondary | ICD-10-CM | POA: Diagnosis not present

## 2015-09-21 DIAGNOSIS — D3131 Benign neoplasm of right choroid: Secondary | ICD-10-CM | POA: Diagnosis not present

## 2015-09-21 DIAGNOSIS — H43813 Vitreous degeneration, bilateral: Secondary | ICD-10-CM | POA: Diagnosis not present

## 2015-09-21 DIAGNOSIS — H353112 Nonexudative age-related macular degeneration, right eye, intermediate dry stage: Secondary | ICD-10-CM

## 2015-09-21 DIAGNOSIS — I10 Essential (primary) hypertension: Secondary | ICD-10-CM

## 2015-09-21 DIAGNOSIS — H353124 Nonexudative age-related macular degeneration, left eye, advanced atrophic with subfoveal involvement: Secondary | ICD-10-CM

## 2015-09-30 ENCOUNTER — Telehealth: Payer: Self-pay | Admitting: Cardiovascular Disease

## 2015-09-30 NOTE — Telephone Encounter (Signed)
Spoke with patient who states she has had increased SOB, lower extremity edema, and weight gain over the past 2 weeks.  States gained 7-8 lbs in 1 week.  She states she increased lasix to 3 times per week starting last week but has not noted much improvement.  She states she has a dry nonproductive cough that has developed over the last week as well.  She denies increased salt intake and states she stays well hydrated by drinking water throughout the day.  She took 20 mg of lasix today.  I advised her to take 20 mg again tomorrow and to call me back to note improvement in symptoms.  She has an appointment with Dr. Acie Fredrickson on Monday.   After hanging up with the patient I spoke with Dr. Acie Fredrickson and he advised patient take 40 mg today and tomorrow and call me back tomorrow.  He states if she notes marked increase in urine output and symptom improvement, that she may continue this dose until Monday if needed.   I called her back to report his advice and she verbalized understanding to take an additional 20 mg lasix today and 40 mg tomorrow.  I advised her to call me several hours after taking the medication tomorrow to report.  I advised her to continue to monitor her weight to report.  She verbalized understanding and agreement.

## 2015-09-30 NOTE — Telephone Encounter (Signed)
New message   Pt c/o swelling: STAT is pt has developed SOB within 24 hours  1. How long have you been experiencing swelling?  Two weeks  2. Where is the swelling located? Legs and stomach 3.  Are you currently taking a "fluid pill"? yes 4.  Are you currently SOB? No, she has been SOB and it started 3 weeks ago  5.  Have you traveled recently? No  Gained 7 pounds

## 2015-10-01 NOTE — Telephone Encounter (Signed)
F/u Message   pt calling to speak with RN about her update from yesterday. Please call back to discuss

## 2015-10-01 NOTE — Telephone Encounter (Signed)
Spoke with patient who called to report that weight is down 3 lbs; states weight was down 3 lbs before she called me yesterday so that is a total loss of 6 lbs this week.  States she has increased urinary output and cough has almost completely subsided.  States bilateral legs are less edematous.  She states she continues to have SOB with exertion but she does not notice it if she is sitting or laying down.  I advised her that per Dr. Acie Fredrickson, she may take an additional 40 mg of lasix tomorrow.  I advised that if she continues to put out a lot of fluid today, that she can wait and take the extra lasix on Saturday or Sunday.  I advised her to call back to the office to reach the on-call provider if she has questions or concerns over the weekend.  She is scheduled to see Dr. Acie Fredrickson on Monday 6/26 @ 2:00 pm.  She verbalized understanding and agreement with plan of care and thanked me for the call.

## 2015-10-04 NOTE — Telephone Encounter (Signed)
Agree with note from Christen Bame, RN. Will address on MOnday

## 2015-10-05 ENCOUNTER — Ambulatory Visit (INDEPENDENT_AMBULATORY_CARE_PROVIDER_SITE_OTHER): Payer: Medicare Other | Admitting: Cardiovascular Disease

## 2015-10-05 ENCOUNTER — Ambulatory Visit (INDEPENDENT_AMBULATORY_CARE_PROVIDER_SITE_OTHER): Payer: Medicare Other | Admitting: *Deleted

## 2015-10-05 ENCOUNTER — Encounter: Payer: Self-pay | Admitting: Cardiovascular Disease

## 2015-10-05 VITALS — BP 124/60 | HR 40 | Ht 61.0 in | Wt 137.1 lb

## 2015-10-05 DIAGNOSIS — I214 Non-ST elevation (NSTEMI) myocardial infarction: Secondary | ICD-10-CM

## 2015-10-05 DIAGNOSIS — I480 Paroxysmal atrial fibrillation: Secondary | ICD-10-CM

## 2015-10-05 DIAGNOSIS — I5032 Chronic diastolic (congestive) heart failure: Secondary | ICD-10-CM | POA: Diagnosis not present

## 2015-10-05 DIAGNOSIS — I482 Chronic atrial fibrillation, unspecified: Secondary | ICD-10-CM

## 2015-10-05 LAB — POCT INR: INR: 2.4

## 2015-10-05 MED ORDER — FUROSEMIDE 20 MG PO TABS: 40.0000 mg | ORAL_TABLET | ORAL | Status: DC

## 2015-10-05 MED ORDER — POTASSIUM CHLORIDE ER 10 MEQ PO TBCR: EXTENDED_RELEASE_TABLET | ORAL | Status: DC

## 2015-10-05 NOTE — Patient Instructions (Addendum)
Medication Instructions:  STOP Atenolol INCREASE Lasix to 40 mg 4 times per week INCREASE Kdur to 10 meq 4 times per week on same days as Lasix   Labwork: TODAY - basic metabolic panel   Testing/Procedures: None Ordered   Follow-Up: Your physician wants you to follow-up in: 3 months with Dr. Acie Fredrickson.  You will receive a reminder letter in the mail two months in advance. If you don't receive a letter, please call our office to schedule the follow-up appointment.   If you need a refill on your cardiac medications before your next appointment, please call your pharmacy.   Thank you for choosing [facility]! Christen Bame, RN [phone]

## 2015-10-05 NOTE — Progress Notes (Signed)
Cardiology Office Note   Date:  She's been on Plavix and aspirin. 10/05/2015   ID:  Brittany [name], DOB Feb 18, 1928, MRN [id number]  PCP:  Mayra Neer, MD  Cardiologist:   Mertie Moores, MD   Chief Complaint  Patient presents with  . Follow-up    diastolic CHF   1. Aortic valve replacement 2. Dyslipidemia 3. Hypertension 4. 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  5. Atrial fibrillation  History of Present Illness:  Brittany [name] is an 82 yo with aortic valve replacement, HTN, and hyperlipidemia. She has not had any cardiac complaints recently. She complains of easy bruising on her arms.   She had an episode of severe heart burn this past week.   She's had a stroke over the past year. This has left her with some balance issues and some loss of peripheral vision.  Nov. 4, 2014:  Brittany [name] is doing well from a cardiac standpoint. She is having problems with interstitial cystitis. She had a procedure last week and had a small bladder tear which they are letting heal. She is still having some significant pain.   Aug 27, 2013:  Brittany [name] is doing OK. She is not having any problems . Still very active.  She had an AVR   She is having problems with interstitial cyctitis.   November 06, 2013:  Brittany [name] was admitted to the hospital twice on July , 2015: DC summary :  82 year old Caucasian female with past medical history significant for hypertension, hyperlipidemia, history of CVA permanent atrial fibrillation on Xarelto for the last 2 years, and history of aortic valve replacement with bioprosthetic valve in 2005, here with chest pain. She was recently admitted to the hospital for management of malignant HTN , diastolic HF and headache. She was seen by Dr Haroldine Laws and [place] on 10/16/2013. She returns within 24 hrs for symptoms of chest pain . Pt states that earlier in the afternoon while she was trying to sleep post lunch she started experiencing soreness ,  heaviness "someone sitting on my chest" on the left side radiating to left arm with associated mild SOB. This improved with SL NTG , however, she continues to have some chest pain. Pt denied any orthopnea, PND , LE edema , syncope, claudication , palpitation etc . She reports medication compliance. She bruises easily in her skin and once episodes of epistaxis several months ago.  She was admitted. Xarelto was held in anticipation of coronary angiography. ASA, statin, BB, IV NTG and heparin added. She ruled in for NSTEMI with troponin of 0.63. Left heart cath revealed severe, fibrotic disease in the ostial right coronary artery which was the culprit lesion. This was successfully stented with a 2.5 x 14 resolute drug-eluting stent, postdilated to 3.3 mm in diameter. She was started on plavix, however, a P2Y12 came back at 289 so she was started on Brilinta. Cardizem cd 120 was added for afib RVR with improvement. Due to mild cough Lisinopril was changed to irbesartan. Xarelto was changed to coumadin and she will follow up two days after DC in the coumadin clinic   She now presents for follow up . She has some MSK pain ( hurts when she presses her chest. She has had some bleeding. She woke up in the morning with a small skin tear on her right ankle . There was lots of blood in the bed. The bleeding eventually stopped with pressure. She also has had a nose bleed.  She has not had any chest pain and her energy levels have been better. INR levels have been OK.   Oct. 29, 2015:  Brittany [name] feeling better. She had a bad viral illness last week and thinks that she might have the flu. She is getting better.  She's had some problems with anemia and she is turned in 3 sets of guaiac cards. She has had some dyspnea and was started on Lasix on a QOD schedule.  She is now off Brilinta. She had a major bleeding from he right foot. She was on brilinta because She had an inadequate response to  plavix originally but later we discovered that she was also on Omeprazole. We have stopped the omeprazone and her PRU is better. She is taking Nexium instead.   Off Eliquis. Is back on warfarin. doing well     June 20, 2014:  Brittany [name] is a 82 y.o. female who presents for follow up .  She has had prolonged nausea and abdominal pain.  Worse with eating. No cardiac symptoms .   Sept. 8, 2016:    Doing well from a cardiac issue.  has been diagnosed with COPD ( was a former smoker, 25 years ago )  Still has some stomach issues.   Is still weak but is gradually improving  Having intermittant nose bleeds - is on Plavix and couamdin  June 18, 2015:  Doing ok from a cardiac standpoint Walks with the walker - has some leg edema .  Can use a cane when she is in her house Avoids eating salt-does not eat out much .     June 26, [address]:  Brittany [name] has been complaining of increasing shortness of breath / cough over the past several weeks.    We have backed off her diuretic because of the concern for renal insufficiency.   Her previous lasix dose was 20 mg on Mondays and Thursday.     Last week she called Sharyn Lull and we increased her Lasix.  40 mg a day for 2-3 days. She seems to be feeling quite a bit better.  She's lost 7 pounds. She is back close to normal .    Cough is better,   Still short of breath   Past Medical History  Diagnosis Date  . Dyslipidemia     takes Lipitor daily  . Arthritis   . Urine incontinence   . Diverticulosis   . IC (interstitial cystitis)   . S/P aortic valve replacement     2005  . H/O hiatal hernia   . History of TIA (transient ischemic attack)     2013-- RESIDUAL PERIPHERAL VISION RIGHT EYE--  RESOLVED  . History of bacterial endocarditis   . Foot drop     SINCE 1987  . History of gastritis   . Chronic back pain   . Hypertension   . Atrial fibrillation ([place])   . GERD (gastroesophageal reflux disease)   . Hearing impaired     Bilateral  hearing aids  . History of stomach ulcers     many yrs ago  . Dysrhythmia     Atrial Fibrillation  . Pulmonary hypertension ([place]) 10/2013    Based on TTE    Past Surgical History  Procedure Laterality Date  . Abdominal hysterectomy  1967  . Aortic valve replacement  09-29-2003     [facility] pericardial tissue valve  . Cholecystectomy  1993  . Vein ligation  1969  RIGHT LOWER LEG  . Dilation and curettage of uterus    . Ercp N/A 08/10/2012    Procedure: ENDOSCOPIC RETROGRADE CHOLANGIOPANCREATOGRAPHY (ERCP);  Surgeon: Inda Castle, MD;  Location: [place];  Service: Gastroenterology;  Laterality: N/A;  . Cataract extraction w/ intraocular lens  implant, bilateral    . Appendectomy  1933  . Cardiac catheterization  07-15-2003 DR HELEN PRESTON    MODERATE TO MODERATELY SEVERE CALCIFIC AORTIC STENOSIS/  NORMAL CORONARY ARTERIES  . Right shoulder arthroscopy w/ debridement rotator cuff and labral tear/ acrominoplasty/ distal clavicle excision/ ca ligament release  10-08-1999  . Cysto/ hydrodistention/ instillation clorpactin  MULTIPLE  last one 2009  . Mini-open right rotator cuff repair  07-12-2001  . Transvaginal tape procedure  07-30-2002  . Lumbar disc surgery  1985 &  2007  . Macroplastique urethral implantation  08-27-2009  . Transthoracic echocardiogram  08-10-2011    MILD LVH/  EF 55-60%/  NORMAL AVR TISSUE/ MILD MR/  MODERATE DILATED LA/  MILD DILATED RA  . Cardiovascular stress test  01-30-2012  DR NASHER    NORMAL NUCLEAR STUDY/  EF 73%/  NORMAL LVF  . Tonsillectomy and adenoidectomy    . Carpal tunnel release Bilateral   . Cysto with hydrodistension N/A 02/05/2013    Procedure: CYSTOSCOPY/HYDRODISTENSION;  Surgeon: Irine Seal, MD;  Location: [facility];  Service: Urology;  Laterality: N/A;  . Left heart catheterization with coronary angiogram N/A 10/18/2013    Procedure: LEFT HEART CATHETERIZATION WITH CORONARY ANGIOGRAM;  Surgeon: Jettie Booze, MD;  Location: [facility] C[facility] LAB;  Service: Cardiovascular;  Laterality: N/A;  . Esophagogastroduodenoscopy (egd) with propofol N/A 08/18/2014    Procedure: ESOPHAGOGASTRODUODENOSCOPY (EGD) WITH PROPOFOL;  Surgeon: Inda Castle, MD;  Location: [facility];  Service: Endoscopy;  Laterality: N/A;     Current Outpatient Prescriptions  Medication Sig Dispense Refill  . atenolol (TENORMIN) 25 MG tablet Take 1 tablet (25 mg total) by mouth daily. 90 tablet 3  . atorvastatin (LIPITOR) 20 MG tablet Take 20 mg by mouth daily.    . diazepam (VALIUM) 5 MG tablet Take 5 mg by mouth every 8 (eight) hours as needed for anxiety or muscle spasms.     Marland Kitchen diltiazem (CARTIA XT) 120 MG 24 hr capsule Take 120 mg by mouth 2 (two) times daily.    . furosemide (LASIX) 20 MG tablet Take one tablet by mouth on Mondays and Thursdays (Patient taking differently: Take one tablet by mouth on Tuesday and Thursdays) 45 tablet 3  . HYDROcodone-acetaminophen (NORCO/VICODIN) 5-325 MG per tablet Take 1 tablet by mouth every 6 (six) hours as needed.    . hyoscyamine (LEVSIN, ANASPAZ) 0.125 MG tablet Take 0.125 mg by mouth every 4 (four) hours as needed.    Marland Kitchen ipratropium (ATROVENT) 0.06 % nasal spray 1-2 sprays each nostril twice daily    . lisinopril (PRINIVIL,ZESTRIL) 40 MG tablet Take 20 mg by mouth daily.     Marland Kitchen LORazepam (ATIVAN) 0.5 MG tablet TAKE 1 TABLET BY MOUTH TWICE DAILY 60 tablet 2  . nystatin (MYCOSTATIN) 100000 UNIT/ML suspension Take 5 mLs by mouth 3 (three) times daily.    . ondansetron (ZOFRAN) 4 MG tablet Take 4 mg by mouth 2 (two) times daily as needed for nausea or vomiting (nausea/vomiting).    Marland Kitchen oxybutynin (DITROPAN) 5 MG tablet Take 5 mg by mouth 3 (three) times daily.     . pantoprazole (PROTONIX) 40 MG tablet Take 1 tablet (40 mg total)  by mouth daily. 30 tablet 1  . potassium chloride (K-DUR) 10 MEQ tablet Take one tablet by mouth on Mondays and Thursdays (Patient taking differently: Take one tablet  by mouth on Tuesdays and Thursdays) 45 tablet 3  . promethazine (PHENERGAN) 12.5 MG tablet Take 1 tablet (12.5 mg total) by mouth every 6 (six) hours as needed for nausea or vomiting. 30 tablet 0  . ranitidine (ZANTAC) 150 MG tablet Take 150 mg by mouth at bedtime.    Marland Kitchen warfarin (COUMADIN) 5 MG tablet Take as directed by [facility] 30 tablet 2   No current facility-administered medications for this visit.    Allergies:   Amlodipine; Amoxicillin; Carafate; Cephalexin; Codeine; Irbesartan; Morphine and related; Neurontin; Nitrofurantoin monohyd macro; and Sulfa antibiotics    Social History:  The patient  reports that she quit smoking about 25 years ago. Her smoking use included Cigarettes. She started smoking about 66 years ago. She has a 20 pack-year smoking history. She has never used smokeless tobacco. She reports that she does not drink alcohol or use illicit drugs.   Family History:  The patient's family history includes Alcohol abuse in her sister and sister; Bipolar disorder in her brother and son; Colon cancer in her maternal aunt; Coronary artery disease in her brother; Esophageal cancer in her maternal grandfather; Heart disease in her father; Stomach cancer in her maternal grandmother. There is no history of Lung disease.    ROS:  Please see the history of present illness.    Review of Systems: Constitutional:  admits to  , appetite change    HEENT: denies photophobia, eye pain, redness, hearing loss, ear pain, congestion, sore throat, rhinorrhea, sneezing, neck pain, neck stiffness and tinnitus.  Respiratory: admits to SOB, DOE with activity , cough, chest tightness, and wheezing.  Cardiovascular: denies chest pain, palpitations and leg swelling.  Gastrointestinal: admits to nausea, vomiting, abdominal pain,   Genitourinary: denies dysuria, urgency, frequency, hematuria, flank pain and difficulty urinating.  Musculoskeletal: admits to   back pain,     Skin: denies pallor,  rash and wound.  Neurological: denies dizziness, seizures, syncope, weakness, light-headedness, numbness and headaches.   Hematological: admits to   easy bruising,   Psychiatric/ Behavioral: denies suicidal ideation, mood changes, confusion, nervousness, sleep disturbance and agitation.       All other systems are reviewed and negative.    PHYSICAL EXAM: VS:  BP 124/60 mmHg  Pulse 40  Ht 5\' 1"  (1.549 m)  Wt 137 lb 1.9 oz (62.197 kg)  BMI 25.92 kg/m2 , BMI Body mass index is 25.92 kg/(m^2). GEN: Well nourished, well developed, in no acute distress HEENT: normal Neck: no JVD, carotid bruits, or masses Cardiac: Irreg.. Irreg. ; no murmurs, rubs, or gallops,no edema  Respiratory:  clear to auscultation bilaterally, normal work of breathing GI: soft, nontender, nondistended, + BS Brittany: no deformity or atrophy Skin: warm and dry, no rash Neuro:  Strength and sensation are intact Psych: normal   EKG:  EKG is ordered today. The ekg ordered today demonstrates atrial fibrillation with a heart rate of 40. She has no ST or T wave changes.   Recent Labs: 07/06/2015: BUN 18; Creat 0.92*; Potassium 4.5; Sodium 135 08/19/2015: Hemoglobin 11.7*; Platelets 221    Lipid Panel    Component Value Date/Time   CHOL 159 10/12/2013 0525   TRIG 53 10/12/2013 0525   HDL 62 10/12/2013 0525   CHOLHDL 2.6 10/12/2013 0525   VLDL 11 10/12/2013 0525  [place] 86 10/12/2013 0525      Wt Readings from Last 3 Encounters:  10/05/15 137 lb 1.9 oz (62.197 kg)  09/01/15 134 lb 9.6 oz (61.054 kg)  08/19/15 134 lb (60.782 kg)      Other studies Reviewed: Additional studies/ records that were reviewed today include: records from medical doctor. Review of the above records demonstrates: several months of abdominal pain    ASSESSMENT AND PLAN:  1. Aortic valve replacement - Brittany [name] is doing well. We'll continue the current medications. She's on Coumadin for her atrial fibrillation.  2.  Dyslipidemia- continue current dose of atorvastatin.  3. Hypertension- blood pressure is well-controlled.  4. 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  5. Atrial fibrillation- very stable. Continue Coumadin. Her rate is slow - will DC the atenolol   6. COPD:  Has a chronic cough .    7. CAD:   had a stent placed in July, 2015.  She's been having some nosebleeds and at this point I think that we can safely stop the Plavix. She'll continue with Coumadin I'll see her again in 6 month.  8. Chronic diastolic congestive heart failure.  She seems to be doing lots better on the increased dose of Lasix. At this point I think we will continue with a slightly higher dose of Lasix. I anticipate that her creatinine may increase a little bit with this increased dose of Lasix. We'll check a basic medical profile today. We will increase her Lasix dose to 40 mg 4 days a week and potassium chloride 10 mg 4 days week. She understands that she can increase the Lasix to 40 mg a day for 2-3 days if she develops recurrent shortness of breath and leg edema.  Current medicines are reviewed at length with the patient today.  The patient does not have concerns regarding medicines.  The following changes have been made:  no change   Disposition:   FU with me in 3 months     Signed, Mertie Moores, MD  10/05/2015 2:51 PM    [facility] [address], [place], [place]  [address] Phone: [phone]; Fax: [phone]

## 2015-10-06 LAB — BASIC METABOLIC PANEL
BUN: 18 mg/dL (ref 7–25)
CHLORIDE: 97 mmol/L — AB (ref 98–110)
CO2: 27 mmol/L (ref 20–31)
CREATININE: 0.78 mg/dL (ref 0.60–0.88)
Calcium: 9 mg/dL (ref 8.6–10.4)
Glucose, Bld: 68 mg/dL (ref 65–99)
Potassium: 5 mmol/L (ref 3.5–5.3)
Sodium: 133 mmol/L — ABNORMAL LOW (ref 135–146)

## 2015-10-09 ENCOUNTER — Telehealth: Payer: Self-pay | Admitting: Cardiovascular Disease

## 2015-10-09 MED ORDER — ATENOLOL 25 MG PO TABS: 25.0000 mg | ORAL_TABLET | Freq: Every day | ORAL | Status: DC

## 2015-10-09 NOTE — Telephone Encounter (Signed)
Patient reports BPs:  141/76, HR 85  148/84, HR 76  179/91, HR 82  147/86, HR 79 States her heart is racing and causing some chest discomfort. Denies chest pain/pressure, radiating pain, edema, wt gain. She does have some SOB w/ heart racing. Reviewed with Nahser - order to restart Atenolol 25 mg daily.  Patient to monitor BPs/HRs - if HR drops too low (below 60), patient is to take 1/2 tablet and call office to inform us of change. Patient verbalized understanding and agreeable to plan.

## 2015-10-09 NOTE — Telephone Encounter (Signed)
New message   Pt verbalized that she was int he office this week and Dr. Cathie Olden took her off Atenolol and she wants to go back on it

## 2015-10-19 ENCOUNTER — Telehealth: Payer: Self-pay

## 2015-10-19 ENCOUNTER — Other Ambulatory Visit: Payer: Self-pay | Admitting: Gastroenterology

## 2015-10-19 NOTE — Telephone Encounter (Signed)
We can refill it but can you see if she wants to try the 20mg  dosing? Lose dose would be better for her at her age to minimize risks of the medication. If it does not control her symptoms she can take two (total 40mg ) PRN. Thanks

## 2015-10-19 NOTE — Telephone Encounter (Signed)
Refill request on pantoprazole 40 mg one a day. Last office visit 09-01-2015.

## 2015-10-21 ENCOUNTER — Telehealth: Payer: Self-pay | Admitting: Cardiovascular Disease

## 2015-10-21 DIAGNOSIS — R001 Bradycardia, unspecified: Secondary | ICD-10-CM

## 2015-10-21 DIAGNOSIS — I480 Paroxysmal atrial fibrillation: Secondary | ICD-10-CM

## 2015-10-21 NOTE — Telephone Encounter (Signed)
Brittany [name] is calling because her weight was 142lbs on Monday morning, on Tues it was 137 and today is 142lbs and she has double up on her lasik  , . Blood pressure was 101/47  W/ heart rate at 47on this morning and at 11:56 it is 99/48 w/heart rate of 45. Please call .  Thanks

## 2015-10-21 NOTE — Telephone Encounter (Signed)
Pt notified and aware. She has tried the the FD guard and had little to no response. So she will pass on that for now. She is going to try and just increase the Zofran for now and see how she responds to the increase. She will call back if no improvement. She also at this time declines the Buspirone for now.

## 2015-10-21 NOTE — Telephone Encounter (Signed)
Pt wants to stay on her current dose of the Protonix as she is still having a lot of nausea after meals. She uses the Zofran daily and it wares off too soon. If she uses the Phenergan she becomes sleepy.

## 2015-10-21 NOTE — Telephone Encounter (Signed)
Any recommendations for her nausea?

## 2015-10-21 NOTE — Telephone Encounter (Signed)
I think her nausea is likely due to chronic narcotics use which I discussed with her at the clinic visit, and ongoing for a few years now. Long term the solution is for her to stop her narcotics. I had recommended a trial of FD guard, not sure if she tried this yet. If not she should do this. She can also take zofran more frequently if needed. We can also offer her a trial of buspirone if she has exhausted these other options. Thanks

## 2015-10-21 NOTE — Telephone Encounter (Signed)
Spoke with patient who states BP and pulse are lower than normal today and her weight has fluctuated over the past 3 days.  She reports most recent readings are: 105/54, 67 States she is having a great deal of nausea today despite taking zofran.  She states she is eating and drinking well and there have been no major changes that could have caused weight fluctuations.  She restarted atenolol on 6/30 after calling the office to report elevated BP readings.  She is currently taking atenolol 25 mg daily.  I advised her to hold the medication tomorrow and Friday since she has already taken it today.  I advised that I will call her Friday to see how she is feeling.  She verbalized understanding and agreement.   After reviewing with Dr. Acie Fredrickson, who is in the office today, he states he would prefer for patient's BP to be a little high rather than low due to risk of falling with hypotension.

## 2015-10-21 NOTE — Telephone Encounter (Signed)
Okay that's fine. Thank

## 2015-10-23 NOTE — Telephone Encounter (Signed)
Follow Up:   Pt says she forgot to give you her weight for today. It is 139,3lbs less than yesterday.

## 2015-10-23 NOTE — Telephone Encounter (Signed)
Discussed with Christen Bame, RN. HR remains slow while she is on Atenolol  will DC atenolol I would rather her have mildly elevated BP at this point Lets place a 48 HR Holter moniter.

## 2015-10-23 NOTE — Telephone Encounter (Signed)
Reviewed Dr. Elmarie Shiley advice with patient who states she has not taken Atenolol today and will not take any more.  She is in agreement to come in on Tuesday 7/18 for 48 hour monitor placement. I advised her to resume lasix schedule of 4 times per week starting Sunday.  I advised that she proceed to [facility] if sustained heart rate is <46 bpm.  She states she will continue to monitor through the weekend.  She thanked me for the call.

## 2015-10-23 NOTE — Telephone Encounter (Signed)
Spoke with patient who states yesterday's weight was 142 lb even after doubling up on Lasix.  Reports vital signs are as follows: 7/12  @ 1700 165/94 mmHg, 59 bpm 7/13 @ 0800 122/59 mmHg, 50 bpm 7/13 @ 1700 123/58 mmHg, 36 bpm 7/13 @ 2000 143/61 mmHg, 47 bpm 7/14 @ 1005 158/74 mmHg, 65 bpm She states she is concerned about variations in blood pressure and heart rate and we discussed no previous known history of pulse < 40 bpm. She reports she has not taken any atenolol since 7/12.  She states she did feel poor at the time of the pulse reading of 36 bpm.  She states she talked to Dr. Doyne Keel office yesterday who called to check on her GI symptoms.  She states she continues to have constant nausea.  She states she took zofran 3 times yesterday and she states she cannot tolerate it because of dizziness.  Denies dizziness or light headedness currently.  I advised her that I will discuss with Dr. Acie Fredrickson who is in the office today and will call her back with his advice.  She verbalized understanding and agreement.

## 2015-10-26 ENCOUNTER — Other Ambulatory Visit: Payer: Self-pay | Admitting: Cardiovascular Disease

## 2015-10-26 MED ORDER — WARFARIN SODIUM 5 MG PO TABS: ORAL_TABLET | ORAL | Status: DC

## 2015-10-26 NOTE — Telephone Encounter (Signed)
New message    *STAT* If patient is at the pharmacy, call can be transferred to refill team.   1. Which medications need to be refilled? (please list name of each medication and dose if known)  Warfarin 2. Which pharmacy/location (including street and city if local pharmacy) is medication to be sent to? [facility] [phone] 3. Do they need a 30 day or 90 day supply? [place]

## 2015-10-27 ENCOUNTER — Ambulatory Visit (INDEPENDENT_AMBULATORY_CARE_PROVIDER_SITE_OTHER): Payer: Medicare Other

## 2015-10-27 DIAGNOSIS — I480 Paroxysmal atrial fibrillation: Secondary | ICD-10-CM

## 2015-10-27 DIAGNOSIS — R001 Bradycardia, unspecified: Secondary | ICD-10-CM | POA: Diagnosis not present

## 2015-10-27 DIAGNOSIS — I482 Chronic atrial fibrillation, unspecified: Secondary | ICD-10-CM

## 2015-10-27 DIAGNOSIS — I214 Non-ST elevation (NSTEMI) myocardial infarction: Secondary | ICD-10-CM | POA: Diagnosis not present

## 2015-10-27 LAB — POCT INR: INR: 1.9

## 2015-11-06 ENCOUNTER — Telehealth: Payer: Self-pay | Admitting: Nurse Practitioner

## 2015-11-06 NOTE — Telephone Encounter (Signed)
Spoke with patient to review holter monitor and plan to continue current medications per Dr. Acie Fredrickson.  She states she is tolerating lasix well @ 4 doses per week.  States she takes extra lasix the other 3 days of the week if she gains weight or notices swelling.  She remains off the atenolol.  She states she gets SOB with minor activity at various times, such as just now when she walked to the phone from the other side of the kitchen. We discussed that this is most likely associated with her chronic a fib.  I advised that I will discuss with Dr. Acie Fredrickson when he returns to the office.  I do not see documentation that she has tried anti-arrhythmic medications and am not sure that she would be a candidate. I advised someone will call back next week with Dr. Elmarie Shiley advice.  She verbalized understanding and thanked me for the call.

## 2015-11-06 NOTE — Telephone Encounter (Signed)
Agree with note from Michelle Swinyer, RN  

## 2015-11-06 NOTE — Telephone Encounter (Signed)
-----   Message from Thayer Headings, MD sent at 11/06/2015  8:02 AM EDT ----- Atrial fib The HR is WNL for most of the time She has bradycardia while sleeping  Rare PVCs

## 2015-11-11 ENCOUNTER — Ambulatory Visit: Payer: Medicare Other | Admitting: Pulmonary Disease

## 2015-11-17 ENCOUNTER — Ambulatory Visit (INDEPENDENT_AMBULATORY_CARE_PROVIDER_SITE_OTHER): Payer: Medicare Other

## 2015-11-17 ENCOUNTER — Ambulatory Visit (INDEPENDENT_AMBULATORY_CARE_PROVIDER_SITE_OTHER)
Admission: RE | Admit: 2015-11-17 | Discharge: 2015-11-17 | Disposition: A | Discharge status: Home / Self Care | Payer: Medicare Other | Source: Ambulatory Visit | Attending: Pulmonary Disease | Admitting: Pulmonary Disease

## 2015-11-17 DIAGNOSIS — I480 Paroxysmal atrial fibrillation: Secondary | ICD-10-CM | POA: Diagnosis not present

## 2015-11-17 DIAGNOSIS — R599 Enlarged lymph nodes, unspecified: Secondary | ICD-10-CM

## 2015-11-17 DIAGNOSIS — I482 Chronic atrial fibrillation, unspecified: Secondary | ICD-10-CM

## 2015-11-17 DIAGNOSIS — I214 Non-ST elevation (NSTEMI) myocardial infarction: Secondary | ICD-10-CM

## 2015-11-17 DIAGNOSIS — J432 Centrilobular emphysema: Secondary | ICD-10-CM | POA: Diagnosis not present

## 2015-11-17 DIAGNOSIS — R590 Localized enlarged lymph nodes: Secondary | ICD-10-CM

## 2015-11-17 LAB — POCT INR: INR: 2.3

## 2015-11-18 ENCOUNTER — Encounter: Payer: Self-pay | Admitting: Pulmonary Disease

## 2015-11-18 ENCOUNTER — Ambulatory Visit (INDEPENDENT_AMBULATORY_CARE_PROVIDER_SITE_OTHER): Payer: Medicare Other | Admitting: Pulmonary Disease

## 2015-11-18 VITALS — BP 112/58 | HR 56 | Ht 61.0 in | Wt 142.2 lb

## 2015-11-18 DIAGNOSIS — R599 Enlarged lymph nodes, unspecified: Secondary | ICD-10-CM

## 2015-11-18 DIAGNOSIS — K219 Gastro-esophageal reflux disease without esophagitis: Secondary | ICD-10-CM | POA: Diagnosis not present

## 2015-11-18 DIAGNOSIS — J432 Centrilobular emphysema: Secondary | ICD-10-CM

## 2015-11-18 DIAGNOSIS — I272 Other secondary pulmonary hypertension: Secondary | ICD-10-CM | POA: Diagnosis not present

## 2015-11-18 DIAGNOSIS — R590 Localized enlarged lymph nodes: Secondary | ICD-10-CM

## 2015-11-18 DIAGNOSIS — I214 Non-ST elevation (NSTEMI) myocardial infarction: Secondary | ICD-10-CM

## 2015-11-18 DIAGNOSIS — J309 Allergic rhinitis, unspecified: Secondary | ICD-10-CM

## 2015-11-18 NOTE — Progress Notes (Signed)
Subjective:    Patient ID: Brittany [name], female    DOB: 1927/12/22, 82 y.o.   MRN: [id number]  C.C.:  Follow-up for Centrilobular Emphysema, Mediastinal Adenopathy, Pulmonary Hypertension, Allergic Rhinitis, & GERD.  HPI Centrilobular Emphysema: Previously tried Incruse. Not currently on any inhaler medication. She reports she has baseline dyspnea that is unchanged. She reports intermittent coughing. No wheezing.   Mediastinal Adenopathy: Enlargement first appreciated June 2016. Largest lymph node is precarinal and measures 1.2 cm. No significant change on recent CT imaging this month. This has been stable since June 2016.  Pulmonary Hypertension: Likely secondary to left ventricular dysfunction & prior aortic valve disease as well as emphysema. On Lasix managed by her PCP. She continues to use her Lasix 2 pills 4 days a week. She reports she is having trouble decreasing her weight back to her baseline this week.   Allergic Rhinitis:  Using an OTC antihistamine. She reports no significant sinus congestion or drainage.   GERD: Prescribed Protonix & Zantac. Esophagram was essentially normal. Patient referred to GI & is following with Dr. Havery Moros. Patient given swallowing recommendations.   Review of Systems No fever, chills, or sweats. No chest pain or pressure. She reports intermittent palpitations.   Allergies  Allergen Reactions  . Amlodipine     Other reaction(s): edema  . Amoxicillin Diarrhea and Nausea And Vomiting  . Carafate [Sucralfate] Swelling    Swelling in knees and blocky red marks on them.  . Cephalexin Diarrhea  . Codeine Nausea Only  . Irbesartan Swelling    Facial and hand numbness and swelling. Patient believes reaction was with this medication but isn't 100% sure. She is currently tolerating lisinopril.  Marland Kitchen Morphine And Related Other (See Comments)    HX ADDICTION / WITHDRAWAL  . Neurontin [Gabapentin] Swelling    Legs swell  . Nitrofurantoin Monohyd Macro  Diarrhea and Nausea Only  . Sulfa Antibiotics Rash   Current Outpatient Prescriptions on File Prior to Visit  Medication Sig Dispense Refill  . atorvastatin (LIPITOR) 20 MG tablet Take 20 mg by mouth daily.    . diazepam (VALIUM) 5 MG tablet Take 5 mg by mouth every 8 (eight) hours as needed for anxiety or muscle spasms.     Marland Kitchen diltiazem (CARTIA XT) 120 MG 24 hr capsule Take 120 mg by mouth 2 (two) times daily.    . furosemide (LASIX) 20 MG tablet Take 2 tablets (40 mg total) by mouth 4 (four) times a week. May take 40 mg up to 4 times per week 60 tablet 11  . HYDROcodone-acetaminophen (NORCO/VICODIN) 5-325 MG per tablet Take 1 tablet by mouth every 6 (six) hours as needed.    . hyoscyamine (LEVSIN, ANASPAZ) 0.125 MG tablet Take 0.125 mg by mouth every 4 (four) hours as needed.    Marland Kitchen ipratropium (ATROVENT) 0.06 % nasal spray 1-2 sprays each nostril twice daily    . lisinopril (PRINIVIL,ZESTRIL) 40 MG tablet Take 20 mg by mouth daily.     Marland Kitchen LORazepam (ATIVAN) 0.5 MG tablet TAKE 1 TABLET BY MOUTH TWICE DAILY 60 tablet 2  . ondansetron (ZOFRAN) 4 MG tablet Take 4 mg by mouth 2 (two) times daily as needed for nausea or vomiting (nausea/vomiting).    Marland Kitchen oxybutynin (DITROPAN) 5 MG tablet Take 5 mg by mouth 3 (three) times daily.     . pantoprazole (PROTONIX) 40 MG tablet TAKE 1 TABLET (40 MG TOTAL) BY MOUTH DAILY. 30 tablet 5  . pantoprazole (PROTONIX) 40 MG  tablet TAKE 1 TABLET (40 MG TOTAL) BY MOUTH DAILY. 30 tablet 0  . potassium chloride (K-DUR) 10 MEQ tablet Take 1 pill 4 times per week on same days you take Lasix (furosemide) 20 tablet 11  . promethazine (PHENERGAN) 12.5 MG tablet Take 1 tablet (12.5 mg total) by mouth every 6 (six) hours as needed for nausea or vomiting. 30 tablet 0  . ranitidine (ZANTAC) 150 MG tablet Take 150 mg by mouth at bedtime.    Marland Kitchen warfarin (COUMADIN) 5 MG tablet Take as directed by [facility] 30 tablet 3   No current facility-administered medications on file prior  to visit.    Past Medical History:  Diagnosis Date  . Arthritis   . Atrial fibrillation ([place])   . Chronic back pain   . Diverticulosis   . Dyslipidemia    takes Lipitor daily  . Dysrhythmia    Atrial Fibrillation  . Foot drop    SINCE 1987  . GERD (gastroesophageal reflux disease)   . H/O hiatal hernia   . Hearing impaired    Bilateral hearing aids  . History of bacterial endocarditis   . History of gastritis   . History of stomach ulcers    many yrs ago  . History of TIA (transient ischemic attack)    2013-- RESIDUAL PERIPHERAL VISION RIGHT EYE--  RESOLVED  . Hypertension   . IC (interstitial cystitis)   . Pulmonary hypertension ([place]) 10/2013   Based on TTE  . S/P aortic valve replacement    2005  . Urine incontinence    Past Surgical History:  Procedure Laterality Date  . ABDOMINAL HYSTERECTOMY  1967  . AORTIC VALVE REPLACEMENT  09-29-2003    [facility] pericardial tissue valve  . APPENDECTOMY  1933  . CARDIAC CATHETERIZATION  07-15-2003 DR HELEN PRESTON   MODERATE TO MODERATELY SEVERE CALCIFIC AORTIC STENOSIS/  NORMAL CORONARY ARTERIES  . CARDIOVASCULAR STRESS TEST  01-30-2012  DR NASHER   NORMAL NUCLEAR STUDY/  EF 73%/  NORMAL LVF  . CARPAL TUNNEL RELEASE Bilateral   . CATARACT EXTRACTION W/ INTRAOCULAR LENS  IMPLANT, BILATERAL    . CHOLECYSTECTOMY  1993  . CYSTO WITH HYDRODISTENSION N/A 02/05/2013   Procedure: CYSTOSCOPY/HYDRODISTENSION;  Surgeon: Irine Seal, MD;  Location: [facility];  Service: Urology;  Laterality: N/A;  . CYSTO/ HYDRODISTENTION/ INSTILLATION CLORPACTIN  MULTIPLE  last one 2009  . DILATION AND CURETTAGE OF UTERUS    . ERCP N/A 08/10/2012   Procedure: ENDOSCOPIC RETROGRADE CHOLANGIOPANCREATOGRAPHY (ERCP);  Surgeon: Inda Castle, MD;  Location: [place];  Service: Gastroenterology;  Laterality: N/A;  . ESOPHAGOGASTRODUODENOSCOPY (EGD) WITH PROPOFOL N/A 08/18/2014   Procedure: ESOPHAGOGASTRODUODENOSCOPY (EGD) WITH  PROPOFOL;  Surgeon: Inda Castle, MD;  Location: [facility];  Service: Endoscopy;  Laterality: N/A;  . LEFT HEART CATHETERIZATION WITH CORONARY ANGIOGRAM N/A 10/18/2013   Procedure: LEFT HEART CATHETERIZATION WITH CORONARY ANGIOGRAM;  Surgeon: Jettie Booze, MD;  Location: [facility] CATH LAB;  Service: Cardiovascular;  Laterality: N/A;  . LUMBAR [place] &  2007  . MACROPLASTIQUE URETHRAL IMPLANTATION  08-27-2009  . MINI-OPEN RIGHT ROTATOR CUFF REPAIR  07-12-2001  . RIGHT SHOULDER ARTHROSCOPY W/ DEBRIDEMENT ROTATOR CUFF AND LABRAL TEAR/ ACROMINOPLASTY/ DISTAL CLAVICLE EXCISION/ CA LIGAMENT RELEASE  10-08-1999  . TONSILLECTOMY AND ADENOIDECTOMY    . TRANSTHORACIC ECHOCARDIOGRAM  08-10-2011   MILD LVH/  EF 55-60%/  NORMAL AVR TISSUE/ MILD MR/  MODERATE DILATED LA/  MILD DILATED RA  . TRANSVAGINAL  TAPE PROCEDURE  07-30-2002  . VEIN LIGATION  1969   RIGHT LOWER LEG   Family History  Problem Relation Age of Onset  . Heart disease Father   . Coronary artery disease Brother   . Bipolar disorder Brother     commited suiside at 78yo  . Alcohol abuse Sister     sister #1  . Alcohol abuse Sister     sister #2  . Colon cancer Maternal Aunt   . Stomach cancer Maternal Grandmother   . Lung disease Neg Hx   . Esophageal cancer Maternal Grandfather   . Bipolar disorder Son     Committed Suicide   Social History   Social History  . Marital status: Widowed    Spouse name: N/A  . Number of children: 4  . Years of education: N/A   Occupational History  . retired    Social History Main Topics  . Smoking status: Former Smoker    Packs/day: 0.50    Years: 40.00    Types: Cigarettes    Start date: 05/06/1949    Quit date: 03/06/1990  . Smokeless tobacco: Never Used  . Alcohol use No     Comment: Remote social EtOH  . Drug use: No  . Sexual activity: No   Other Topics Concern  . None   Social History Narrative   She is originally from [place], [place]. She has previously lived  in [place] for 10 years. She has traveled to all 60 states as well as every [place] in [place]. Prior travel to [place], [place], [place]. Previously worked as an Optometrist. Remote exposure to a parakeet for 2 years in 69s. No known asbestos, mold, or hot tub exposure. She enjoys doing needle point & hand crafts.        Objective:   Physical Exam BP (!) 112/58 (BP Location: Left Arm, Cuff Size: Normal)   Pulse (!) 56   Ht 5\' 1"  (1.549 m)   Wt 142 lb 3.2 oz (64.5 kg)   SpO2 94%   BMI 26.87 kg/m  General:  Elderly Caucasian female. Well-dressed. Comfortable. Integument:  Warm & dry. No rash on exposed skin.  HEENT:  Moist mucous membranes. No oral ulcers. Minimal nasal turbinate swelling. Cardiovascular: Regular rate. Trace edema bilateral lower extremities unchanged. Pulmonary: Overall good aeration. Speaking in complete sentences. No accessory muscle use. Later on auscultation. Abdomen: Soft. Normal bowel sounds. Nondistended.  Lymphatics:  No appreciated cervical or supracavicular LAD.  PFT 03/26/15: FVC 2.15 L (107%) FEV1 1.52 L (105%) FEV1/FVC 0.71 FEF 25-75 1.00 L (107%) no bronchodilator response 12/26/14:FVC 2.09 L (104%) FEV1 1.49 L (101%) FEV1/FVC 0.71 FEF 25-75 0.71 L (75%) no bronchodilator response TLC 3.69 L (80%) RV 65% DLCO uncorrected 62%  6MWT 12/26/14: Walked 180 meters / Baseline Sat 99% on RA / Nadir Sat 99% on RA (c/o hip/leg/calf pain)  IMAGING CT CHEST W/O 11/17/15 (personally reviewed by me): Precarinal lymph node measuring 1.3 cm in short axis. No new lymphadenopathy. No pleural effusion or thickening. Upper lobe emphysematous changes unchanged. No pericardial effusion. Borderline cardiomegaly.  ESOPHAGRAM (06/15/15): Nonspecific esophageal motility disorder. No stricture or mass. No mucosal ulceration. No hiatal hernia.  CT CHEST W/O 06/03/15 (previiously reviewed by me): 1.3 cm right paratracheal lymph node that is precarinal unchanged. No other nodule or opacity  appreciated. No pathologic mediastinal adenopathy otherwise. Apical predominant centrilobular emphysema unchanged. Right pleural effusion versus thickening that is mild and new compared with prior CT. No pericardial effusion.  CT CHEST W/O 12/31/14 (previously reviewed by me): Left lower lobe density resolved. Mild cardiomegaly without pericardial effusion. No pleural effusion or thickening. Apical predominant centrilobular emphysema. Precarinal lymph node measuring 1.2 cm in short axis.  SWALLOW EVALUATION (12/11/14): No evidence for aspiration..  CXR PA/LAT 10/31/14 (previously reviewed by me): Pronounced curvature of the thoracic spine. Cardiomegaly noted. No nodule or opacity appreciated. No pleural effusion. Mediastinum otherwise normal in contour.  CT CHEST W/ 10/07/14 (previously reviewed by me): No pleural effusion or thickening. Borderline precarinal lymph node measuring 1 cm in short axis. Upper lobe predominant centrilobular emphysema with some cystic changes. 8 mm & 9 mm nodules within the left costophrenic sulcus with some spiculation. No other opacity appreciated.  CARDIAC TTE (10/12/13): LV normal in size with moderate LVH. EF 60-65%. Adequate for wall motion assessment. Grade 2 diastolic dysfunction. LA & RA severely dilated. RV poorly visualized but appears mild to moderately enlarged with "likely normal function". Pulmonary artery systolic pressure 41 mmHg. IVC dilated. No pericardial effusion. No aortic stenosis or regurgitation. Mild mitral regurgitation without stenosis. Mild pulmonic and tricuspid valve regurgitation without stenosis.  LABS 01/29/15 Alpha-1 Antitrypsin:  MM (170)  11/12/14 INR: 3.1  10/31/14 CBC: 9.6/1.0/33.6/311 BMP: 128/3.11/04/22/5/0.61/125/8.9    Assessment & Plan:  82 y.o. female with underlying apical predominant emphysema, mediastinal adenopathy, pulmonary hypertension & GERD.  The patient's mediastinal adenopathy is likely reactive from her left  ventricular dysfunction and I suspect unlikely to represent lymphoma. I did review the results with the patient today and given lack of progression in terms of size on CT imaging or new/enlarging lymph nodes otherwise I do not feel further monitoring with imaging is necessary. I did counsel the patient to notify me if she had any new symptoms that would suggest a malignancy or developing lymphoma. She continues to have minimal symptoms with regards to her underlying emphysema therefore I do not feel attempting to treat with additional inhaler medications is necessary at this time. She does report increased baseline weight this week without response to her usual dose/regimen of Lasix. She is going to address this further with cardiology today. I still do not feel that pulmonary arterial vasodilator therapy is necessary at this time for treatment of her underlying pulmonary hypertension. Her reflux and allergic rhinitis seem to be well-controlled at this time. I instructed the patient to notify my office if she had any new problems or questions before her next appointment as I would be happy to see her sooner.  1. Centrilobular Emphysema: Continuing to hold on inhaler medications at this time. Monitoring symptoms. 2. Mediastinal Adenopathy: Likely secondary to left ventricular dysfunction. No further imaging at this time. Continuing to monitor symptoms. 3. Pulmonary Hypertension: Multifactorial from underlying emphysema as well as left ventricular dysfunction. Currently on diuretic therapy. Continuing to hold on pulmonary arterial vasodilator therapy. 4. Allergic Rhinitis: Continuing on over-the-counter antihistamine therapy. Good control of symptoms. 5. GERD w/ Dysphagia: Continuing Protonix & Zantac. No changes at this time. 6. Health Maintenance: Pneumonia vaccine Fall 2016. 7. Follow-up: Return to clinic in 6 months or sooner if needed.  Sonia Baller Ashok Cordia, M.D. [facility] Pulmonary & Critical Care Pager:   [phone] After 3pm or if no response, call [phone] 12:12 PM 11/18/15

## 2015-11-18 NOTE — Patient Instructions (Signed)
   Call me if you have any new breathing problems before your next appointment.  Call me if you notice any new swollen lymph nodes, persistent fever, chills, or sweats.  I will see you back in 6 months or sooner if needed.

## 2015-11-19 ENCOUNTER — Telehealth: Payer: Self-pay | Admitting: Cardiovascular Disease

## 2015-11-19 NOTE — Telephone Encounter (Signed)
Mrs. Kost is calling because she is on lasik for 4 days and have gained some weight and have some swelling in her ankles

## 2015-11-19 NOTE — Telephone Encounter (Signed)
Spoke with patient who states she has gained 2-3 lbs over the past few days despite taking 40 mg furosemide and 20 meq potassium for 4 days in a row.  She states her lower extremities have mild swelling.  States she is elevating legs above the level of the heart when she is sitting and lying.  Denies SOB.  She saw Dr. Ashok Cordia yesterday for pulmonology evaluation and he did not change any of her medications.  She denies chest or abdominal discomfort.  She states she has been eating well lately and maybe the weight gain is from her diet.  She denies eating high sodium foods.  I advised her to take an additional 20 mg furosemide this afternoon and to call back if symptoms worsen.  She verbalized understanding and agreement and thanked me for the call.

## 2015-11-20 NOTE — Telephone Encounter (Signed)
Per pt call to speak to RN about fluids.  Pt is down 2 lbs. Please give her a call back.

## 2015-11-20 NOTE — Telephone Encounter (Signed)
Spoke with patient who states she is down 2 lbs this morning after taking an additional dose of 20 mg lasix yesterday afternoon.  States ankles are less swollen this morning as well.  She states she feels well today and will continue to take lasix 40 mg 4 times per week as directed.  I advised that if she feels that she needs additional lasix over the next few days to call back next week to let us know.  She verbalized understanding and agreement and thanked me for the call.

## 2015-11-20 NOTE — Progress Notes (Signed)
Called and spoke to pt. Informed her of the results and recs per JN. Pt verbalized understanding and denied any further questions or concerns at this time.

## 2015-11-24 DIAGNOSIS — R0981 Nasal congestion: Secondary | ICD-10-CM | POA: Diagnosis not present

## 2015-11-24 DIAGNOSIS — I482 Chronic atrial fibrillation: Secondary | ICD-10-CM | POA: Diagnosis not present

## 2015-11-24 DIAGNOSIS — J301 Allergic rhinitis due to pollen: Secondary | ICD-10-CM | POA: Diagnosis not present

## 2015-11-29 DIAGNOSIS — S60222A Contusion of left hand, initial encounter: Secondary | ICD-10-CM | POA: Diagnosis not present

## 2015-11-30 ENCOUNTER — Telehealth: Payer: Self-pay | Admitting: Cardiovascular Disease

## 2015-11-30 NOTE — Telephone Encounter (Signed)
Spoke with patient who called to get clarification on Lasix dose.  I advised her per Dr. Elmarie Shiley last office visit note on 10/05/15:   8. Chronic diastolic congestive heart failure.  She seems to be doing lots better on the increased dose of Lasix. At this point I think we will continue with a slightly higher dose of Lasix. I anticipate that her creatinine may increase a little bit with this increased dose of Lasix. We'll check a basic medical profile today. We will increase her Lasix dose to 40 mg 4 days a week and potassium chloride 10 mg 4 days week. She understands that she can increase the Lasix to 40 mg a day for 2-3 days if she develops recurrent shortness of breath and leg edema.  She verbalized understanding and agreement and thanked me for the call.

## 2015-11-30 NOTE — Telephone Encounter (Signed)
Mrs. Casaletto is calling to find out if Dr. Acie Fredrickson wants her to cut back on the amount of lasik that she is taking . Please call

## 2015-12-07 DIAGNOSIS — M5416 Radiculopathy, lumbar region: Secondary | ICD-10-CM | POA: Diagnosis not present

## 2015-12-07 DIAGNOSIS — M542 Cervicalgia: Secondary | ICD-10-CM | POA: Diagnosis not present

## 2015-12-07 DIAGNOSIS — M545 Low back pain: Secondary | ICD-10-CM | POA: Diagnosis not present

## 2015-12-07 DIAGNOSIS — M4806 Spinal stenosis, lumbar region: Secondary | ICD-10-CM | POA: Diagnosis not present

## 2015-12-09 DIAGNOSIS — N3941 Urge incontinence: Secondary | ICD-10-CM | POA: Diagnosis not present

## 2015-12-09 DIAGNOSIS — N3010 Interstitial cystitis (chronic) without hematuria: Secondary | ICD-10-CM | POA: Diagnosis not present

## 2015-12-09 DIAGNOSIS — R102 Pelvic and perineal pain: Secondary | ICD-10-CM | POA: Diagnosis not present

## 2015-12-11 DIAGNOSIS — N3010 Interstitial cystitis (chronic) without hematuria: Secondary | ICD-10-CM | POA: Diagnosis not present

## 2015-12-15 DIAGNOSIS — N3010 Interstitial cystitis (chronic) without hematuria: Secondary | ICD-10-CM | POA: Diagnosis not present

## 2015-12-18 ENCOUNTER — Ambulatory Visit (INDEPENDENT_AMBULATORY_CARE_PROVIDER_SITE_OTHER): Payer: Medicare Other | Admitting: Pharmacist

## 2015-12-18 ENCOUNTER — Encounter (INDEPENDENT_AMBULATORY_CARE_PROVIDER_SITE_OTHER): Payer: Self-pay

## 2015-12-18 DIAGNOSIS — I482 Chronic atrial fibrillation, unspecified: Secondary | ICD-10-CM

## 2015-12-18 DIAGNOSIS — I214 Non-ST elevation (NSTEMI) myocardial infarction: Secondary | ICD-10-CM | POA: Diagnosis not present

## 2015-12-18 DIAGNOSIS — I480 Paroxysmal atrial fibrillation: Secondary | ICD-10-CM

## 2015-12-18 LAB — POCT INR: INR: 1.6

## 2015-12-25 ENCOUNTER — Other Ambulatory Visit: Payer: Self-pay | Admitting: Physician Assistant

## 2015-12-29 DIAGNOSIS — Z23 Encounter for immunization: Secondary | ICD-10-CM | POA: Diagnosis not present

## 2015-12-29 DIAGNOSIS — S8991XA Unspecified injury of right lower leg, initial encounter: Secondary | ICD-10-CM | POA: Diagnosis not present

## 2015-12-29 DIAGNOSIS — S81811A Laceration without foreign body, right lower leg, initial encounter: Secondary | ICD-10-CM | POA: Diagnosis not present

## 2015-12-30 ENCOUNTER — Encounter: Payer: Self-pay | Admitting: Cardiovascular Disease

## 2016-01-04 DIAGNOSIS — E782 Mixed hyperlipidemia: Secondary | ICD-10-CM | POA: Diagnosis not present

## 2016-01-04 DIAGNOSIS — N182 Chronic kidney disease, stage 2 (mild): Secondary | ICD-10-CM | POA: Diagnosis not present

## 2016-01-04 DIAGNOSIS — I130 Hypertensive heart and chronic kidney disease with heart failure and stage 1 through stage 4 chronic kidney disease, or unspecified chronic kidney disease: Secondary | ICD-10-CM | POA: Diagnosis not present

## 2016-01-04 DIAGNOSIS — J449 Chronic obstructive pulmonary disease, unspecified: Secondary | ICD-10-CM | POA: Diagnosis not present

## 2016-01-04 DIAGNOSIS — R600 Localized edema: Secondary | ICD-10-CM | POA: Diagnosis not present

## 2016-01-08 DIAGNOSIS — R748 Abnormal levels of other serum enzymes: Secondary | ICD-10-CM | POA: Diagnosis not present

## 2016-01-08 DIAGNOSIS — E871 Hypo-osmolality and hyponatremia: Secondary | ICD-10-CM | POA: Diagnosis not present

## 2016-01-08 DIAGNOSIS — T148 Other injury of unspecified body region: Secondary | ICD-10-CM | POA: Diagnosis not present

## 2016-01-13 ENCOUNTER — Ambulatory Visit (INDEPENDENT_AMBULATORY_CARE_PROVIDER_SITE_OTHER): Payer: Medicare Other | Admitting: *Deleted

## 2016-01-13 ENCOUNTER — Encounter: Payer: Self-pay | Admitting: Cardiovascular Disease

## 2016-01-13 ENCOUNTER — Ambulatory Visit (INDEPENDENT_AMBULATORY_CARE_PROVIDER_SITE_OTHER): Payer: Medicare Other | Admitting: Cardiovascular Disease

## 2016-01-13 VITALS — BP 142/68 | HR 71 | Ht 61.0 in | Wt 137.4 lb

## 2016-01-13 DIAGNOSIS — I482 Chronic atrial fibrillation, unspecified: Secondary | ICD-10-CM

## 2016-01-13 DIAGNOSIS — I214 Non-ST elevation (NSTEMI) myocardial infarction: Secondary | ICD-10-CM

## 2016-01-13 DIAGNOSIS — I350 Nonrheumatic aortic (valve) stenosis: Secondary | ICD-10-CM | POA: Diagnosis not present

## 2016-01-13 DIAGNOSIS — I5033 Acute on chronic diastolic (congestive) heart failure: Secondary | ICD-10-CM

## 2016-01-13 DIAGNOSIS — I480 Paroxysmal atrial fibrillation: Secondary | ICD-10-CM | POA: Diagnosis not present

## 2016-01-13 LAB — POCT INR: INR: 1.4

## 2016-01-13 NOTE — Patient Instructions (Signed)
Medication Instructions:  Your physician recommends that you continue on your current medications as directed. Please refer to the Current Medication list given to you today.   Labwork: None Ordered   Testing/Procedures: None Ordered   Follow-Up: Your physician wants you to follow-up in: 6 months with Dr. Nahser.  You will receive a reminder letter in the mail two months in advance. If you don't receive a letter, please call our office to schedule the follow-up appointment.   If you need a refill on your cardiac medications before your next appointment, please call your pharmacy.   Thank you for choosing [facility]! Theresea Trautmann, RN [phone]    

## 2016-01-13 NOTE — Progress Notes (Signed)
Cardiology Office Note   ID:  Brittany, [name] Jul 15, 1927, MRN [id number]  PCP:  Mayra Neer, MD  Cardiologist:   Mertie Moores, MD   Chief Complaint  Patient presents with  . Follow-up    PAF, chronic diastolic CHF   1. Aortic valve replacement 2. Dyslipidemia 3. Hypertension 4. 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  5. Atrial fibrillation   Brittany [name] is an 82 yo with aortic valve replacement, HTN, and hyperlipidemia. She has not had any cardiac complaints recently. She complains of easy bruising on her arms.   She had an episode of severe heart burn this past week.   She's had a stroke over the past year. This has left her with some balance issues and some loss of peripheral vision.  Nov. 4, 2014:  Brittany [name] is doing well from a cardiac standpoint. She is having problems with interstitial cystitis. She had a procedure last week and had a small bladder tear which they are letting heal. She is still having some significant pain.   Aug 27, 2013:  Brittany [name] is doing OK. She is not having any problems . Still very active.  She had an AVR   She is having problems with interstitial cyctitis.   November 06, 2013:  Ms Brittany [name] was admitted to the hospital twice on July , 2015: DC summary :  82 year old Caucasian female with past medical history significant for hypertension, hyperlipidemia, history of CVA permanent atrial fibrillation on Xarelto for the last 2 years, and history of aortic valve replacement with bioprosthetic valve in 2005, here with chest pain. She was recently admitted to the hospital for management of malignant HTN , diastolic HF and headache. She was seen by Dr Haroldine Laws and [place] on 10/16/2013. She returns within 24 hrs for symptoms of chest pain . Pt states that earlier in the afternoon while she was trying to sleep post lunch she started experiencing soreness , heaviness "someone sitting on my chest" on the left side radiating to left  arm with associated mild SOB. This improved with SL NTG , however, she continues to have some chest pain. Pt denied any orthopnea, PND , LE edema , syncope, claudication , palpitation etc . She reports medication compliance. She bruises easily in her skin and once episodes of epistaxis several months ago.  She was admitted. Xarelto was held in anticipation of coronary angiography. ASA, statin, BB, IV NTG and heparin added. She ruled in for NSTEMI with troponin of 0.63. Left heart cath revealed severe, fibrotic disease in the ostial right coronary artery which was the culprit lesion. This was successfully stented with a 2.5 x 14 resolute drug-eluting stent, postdilated to 3.3 mm in diameter. She was started on plavix, however, a P2Y12 came back at 289 so she was started on Brilinta. Cardizem cd 120 was added for afib RVR with improvement. Due to mild cough Lisinopril was changed to irbesartan. Xarelto was changed to coumadin and she will follow up two days after DC in the coumadin clinic   She now presents for follow up . She has some MSK pain ( hurts when she presses her chest. She has had some bleeding. She woke up in the morning with a small skin tear on her right ankle . There was lots of blood in the bed. The bleeding eventually stopped with pressure. She also has had a nose bleed.   She has not had any chest pain and her energy levels have  been better. INR levels have been OK.   Oct. 29, 2015:  Ms.  Brittany [name] feeling better. She had a bad viral illness last week and thinks that she might have the flu. She is getting better.  She's had some problems with anemia and she is turned in 3 sets of guaiac cards. She has had some dyspnea and was started on Lasix on a QOD schedule.  She is now off Brilinta. She had a major bleeding from he right foot. She was on brilinta because She had an inadequate response to plavix originally but later we discovered that she was also on Omeprazole. We  have stopped the omeprazone and her PRU is better. She is taking Nexium instead.   Off Eliquis. Is back on warfarin. doing well     June 20, 2014:  Brittany [name] is a 82 y.o. female who presents for follow up .  She has had prolonged nausea and abdominal pain.  Worse with eating. No cardiac symptoms .   Sept. 8, 2016:    Doing well from a cardiac issue.  has been diagnosed with COPD ( was a former smoker, 25 years ago )  Still has some stomach issues.   Is still weak but is gradually improving  Having intermittant nose bleeds - is on Plavix and couamdin  June 18, 2015:  Doing ok from a cardiac standpoint Walks with the walker - has some leg edema .  Can use a cane when she is in her house Avoids eating salt-does not eat out much .     June 26, [address]:  Brittany [name] has been complaining of increasing shortness of breath / cough over the past several weeks.    We have backed off her diuretic because of the concern for renal insufficiency.   Her previous lasix dose was 20 mg on Mondays and Thursday.     Last week she called Sharyn Lull and we increased her Lasix.  40 mg a day for 2-3 days. She seems to be feeling quite a bit better.  She's lost 7 pounds. She is back close to normal .    Cough is better,   Still short of breath   Oct. 4, 2017:  Ms Brittany [name] is seen back today  Still has shortness of breath, especially after she is rushing around .  Has had low salt level and her lasix was decreased and she has been limiting her free water intake.    Past Medical History:  Diagnosis Date  . Arthritis   . Atrial fibrillation ([place])   . Chronic back pain   . Diverticulosis   . Dyslipidemia    takes Lipitor daily  . Dysrhythmia    Atrial Fibrillation  . Foot drop    SINCE 1987  . GERD (gastroesophageal reflux disease)   . H/O hiatal hernia   . Hearing impaired    Bilateral hearing aids  . History of bacterial endocarditis   . History of gastritis   . History of stomach  ulcers    many yrs ago  . History of TIA (transient ischemic attack)    2013-- RESIDUAL PERIPHERAL VISION RIGHT EYE--  RESOLVED  . Hypertension   . IC (interstitial cystitis)   . Pulmonary hypertension 10/2013   Based on TTE  . S/P aortic valve replacement    2005  . Urine incontinence     Past Surgical History:  Procedure Laterality Date  . ABDOMINAL HYSTERECTOMY  1967  . AORTIC VALVE REPLACEMENT  09-29-2003    [place] pericardial tissue valve  . APPENDECTOMY  1933  . CARDIAC CATHETERIZATION  07-15-2003 DR HELEN PRESTON   MODERATE TO MODERATELY SEVERE CALCIFIC AORTIC STENOSIS/  NORMAL CORONARY ARTERIES  . CARDIOVASCULAR STRESS TEST  01-30-2012  DR NASHER   NORMAL NUCLEAR STUDY/  EF 73%/  NORMAL LVF  . CARPAL TUNNEL RELEASE Bilateral   . CATARACT EXTRACTION W/ INTRAOCULAR LENS  IMPLANT, BILATERAL    . CHOLECYSTECTOMY  1993  . CYSTO WITH HYDRODISTENSION N/A 02/05/2013   Procedure: CYSTOSCOPY/HYDRODISTENSION;  Surgeon: Irine Seal, MD;  Location: [facility];  Service: Urology;  Laterality: N/A;  . CYSTO/ HYDRODISTENTION/ INSTILLATION CLORPACTIN  MULTIPLE  last one 2009  . DILATION AND CURETTAGE OF UTERUS    . ERCP N/A 08/10/2012   Procedure: ENDOSCOPIC RETROGRADE CHOLANGIOPANCREATOGRAPHY (ERCP);  Surgeon: Inda Castle, MD;  Location: [facility];  Service: Gastroenterology;  Laterality: N/A;  . ESOPHAGOGASTRODUODENOSCOPY (EGD) WITH PROPOFOL N/A 08/18/2014   Procedure: ESOPHAGOGASTRODUODENOSCOPY (EGD) WITH PROPOFOL;  Surgeon: Inda Castle, MD;  Location: [facility];  Service: Endoscopy;  Laterality: N/A;  . LEFT HEART CATHETERIZATION WITH CORONARY ANGIOGRAM N/A 10/18/2013   Procedure: LEFT HEART CATHETERIZATION WITH CORONARY ANGIOGRAM;  Surgeon: Jettie Booze, MD;  Location: [facility] CATH LAB;  Service: Cardiovascular;  Laterality: N/A;  . LUMBAR [place] &  2007  . MACROPLASTIQUE URETHRAL IMPLANTATION  08-27-2009  . MINI-OPEN RIGHT ROTATOR CUFF  REPAIR  07-12-2001  . RIGHT SHOULDER ARTHROSCOPY W/ DEBRIDEMENT ROTATOR CUFF AND LABRAL TEAR/ ACROMINOPLASTY/ DISTAL CLAVICLE EXCISION/ CA LIGAMENT RELEASE  10-08-1999  . TONSILLECTOMY AND ADENOIDECTOMY    . TRANSTHORACIC ECHOCARDIOGRAM  08-10-2011   MILD LVH/  EF 55-60%/  NORMAL AVR TISSUE/ MILD MR/  MODERATE DILATED LA/  MILD DILATED RA  . TRANSVAGINAL TAPE PROCEDURE  07-30-2002  . VEIN LIGATION  1969   RIGHT LOWER LEG     Current Outpatient Prescriptions  Medication Sig Dispense Refill  . atorvastatin (LIPITOR) 20 MG tablet Take 20 mg by mouth daily.    . diazepam (VALIUM) 5 MG tablet Take 5 mg by mouth every 8 (eight) hours as needed for anxiety or muscle spasms.     Marland Kitchen diltiazem (CARTIA XT) 120 MG 24 hr capsule Take 120 mg by mouth 2 (two) times daily.    . furosemide (LASIX) 20 MG tablet Take 2 tablets (40 mg total) by mouth 4 (four) times a week. May take 40 mg up to 4 times per week 60 tablet 11  . HYDROcodone-acetaminophen (NORCO/VICODIN) 5-325 MG per tablet Take 1 tablet by mouth every 6 (six) hours as needed.    . hyoscyamine (LEVSIN, ANASPAZ) 0.125 MG tablet Take 0.125 mg by mouth every 4 (four) hours as needed.    Marland Kitchen ipratropium (ATROVENT) 0.06 % nasal spray 1-2 sprays each nostril twice daily    . lisinopril (PRINIVIL,ZESTRIL) 40 MG tablet Take 20 mg by mouth daily.     Marland Kitchen LORazepam (ATIVAN) 0.5 MG tablet TAKE 1 TABLET BY MOUTH TWICE DAILY 60 tablet 2  . ondansetron (ZOFRAN) 4 MG tablet TAKE 1 TABLET BY MOUTH TWICE DAILY 60 tablet 2  . oxybutynin (DITROPAN) 5 MG tablet Take 5 mg by mouth 3 (three) times daily.     . pantoprazole (PROTONIX) 40 MG tablet TAKE 1 TABLET (40 MG TOTAL) BY MOUTH DAILY. 30 tablet 0  . potassium chloride (K-DUR) 10 MEQ tablet Take 1 pill 4 times per week on same days you take Lasix (  furosemide) 20 tablet 11  . promethazine (PHENERGAN) 12.5 MG tablet Take 1 tablet (12.5 mg total) by mouth every 6 (six) hours as needed for nausea or vomiting. 30 tablet 0    . ranitidine (ZANTAC) 150 MG tablet Take 150 mg by mouth at bedtime.    Marland Kitchen warfarin (COUMADIN) 5 MG tablet Take as directed by Coumadin Clinic 30 tablet 3   No current facility-administered medications for this visit.     Allergies:   Amlodipine; Amoxicillin; Carafate [sucralfate]; Cephalexin; Codeine; Irbesartan; Morphine and related; Neurontin [gabapentin]; Nitrofurantoin monohyd macro; and Sulfa antibiotics    Social History:  The patient  reports that she quit smoking about 25 years ago. Her smoking use included Cigarettes. She started smoking about 66 years ago. She has a 20.00 pack-year smoking history. She has never used smokeless tobacco. She reports that she does not drink alcohol or use drugs.   Family History:  The patient's family history includes Alcohol abuse in her sister and sister; Bipolar disorder in her brother and son; Colon cancer in her maternal aunt; Coronary artery disease in her brother; Esophageal cancer in her maternal grandfather; Heart disease in her father; Stomach cancer in her maternal grandmother.    ROS:  Please see the history of present illness.    Review of Systems: Constitutional:  admits to  , appetite change    HEENT: denies photophobia, eye pain, redness, hearing loss, ear pain, congestion, sore throat, rhinorrhea, sneezing, neck pain, neck stiffness and tinnitus.  Respiratory: admits to SOB, DOE with activity , cough, chest tightness, and wheezing.  Cardiovascular: denies chest pain, palpitations and leg swelling.  Gastrointestinal: admits to nausea, vomiting, abdominal pain,   Genitourinary: denies dysuria, urgency, frequency, hematuria, flank pain and difficulty urinating.  Musculoskeletal: admits to   back pain,     Skin: denies pallor, rash and wound.  Neurological: denies dizziness, seizures, syncope, weakness, light-headedness, numbness and headaches.   Hematological: admits to   easy bruising,   Psychiatric/ Behavioral: denies suicidal  ideation, mood changes, confusion, nervousness, sleep disturbance and agitation.       All other systems are reviewed and negative.    PHYSICAL EXAM: VS:  BP (!) 142/68   Pulse 71   Ht 5\' 1"  (1.549 m)   Wt 137 lb 6.4 oz (62.3 kg)   SpO2 97%   BMI 25.96 kg/m  , BMI Body mass index is 25.96 kg/m. GEN: Well nourished, well developed, in no acute distress  HEENT: normal  Neck: no JVD, carotid bruits, or masses Cardiac: Irreg.. Irreg. ; no murmurs, rubs, or gallops,no edema  Respiratory:  clear to auscultation bilaterally, normal work of breathing GI: soft, nontender, nondistended, + BS MS: no deformity or atrophy  Skin: warm and dry, no rash Neuro:  Strength and sensation are intact, walks with the assistance of a walker  Psych: normal   EKG:  EKG is not ordered today.  Recent Labs: 08/19/2015: Hemoglobin 11.7; Platelets 221 10/05/2015: BUN 18; Creat 0.78; Potassium 5.0; Sodium 133    Lipid Panel    Component Value Date/Time   CHOL 159 10/12/2013 0525   TRIG 53 10/12/2013 0525   HDL 62 10/12/2013 0525   CHOLHDL 2.6 10/12/2013 0525   VLDL 11 10/12/2013 0525   LDLCALC 86 10/12/2013 0525      Wt Readings from Last 3 Encounters:  01/13/16 137 lb 6.4 oz (62.3 kg)  11/18/15 142 lb 3.2 oz (64.5 kg)  10/05/15 137 lb 1.9 oz (62.2 kg)  Other studies Reviewed: Additional studies/ records that were reviewed today include: records from medical doctor. Review of the above records demonstrates: several months of abdominal pain    ASSESSMENT AND PLAN:  1. Aortic valve replacement - Mrs. Mcalpin is doing well. We'll continue the current medications. She's on Coumadin for her atrial fibrillation.  2. Dyslipidemia- continue current dose of atorvastatin.  3. Hypertension- blood pressure is well-controlled.  4. 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  5. Atrial fibrillation- very stable. Continue Coumadin. Her rate is  Well controlled   6. COPD:  Has a chronic  cough .    7. CAD:   had a stent placed in July, 2015.   We have stopped the Plavix due to nose bleeds.  She'll continue with Coumadin I'll see her again in 6 month.  8. Chronic diastolic congestive heart failure.  She seems to be doing lots better on the increased dose of Lasix.  Current medicines are reviewed at length with the patient today.  The patient does not have concerns regarding medicines.  The following changes have been made:  no change   Disposition:   FU with me in 6 months     Signed, Mertie Moores, MD  01/13/2016 11:51 AM    [facility] [address], [place], [place]  [address] Phone: [phone]; Fax: [phone]

## 2016-01-19 DIAGNOSIS — R748 Abnormal levels of other serum enzymes: Secondary | ICD-10-CM | POA: Diagnosis not present

## 2016-01-19 DIAGNOSIS — E871 Hypo-osmolality and hyponatremia: Secondary | ICD-10-CM | POA: Diagnosis not present

## 2016-01-20 DIAGNOSIS — H524 Presbyopia: Secondary | ICD-10-CM | POA: Diagnosis not present

## 2016-01-20 DIAGNOSIS — H40013 Open angle with borderline findings, low risk, bilateral: Secondary | ICD-10-CM | POA: Diagnosis not present

## 2016-01-20 DIAGNOSIS — H04123 Dry eye syndrome of bilateral lacrimal glands: Secondary | ICD-10-CM | POA: Diagnosis not present

## 2016-01-20 DIAGNOSIS — H53461 Homonymous bilateral field defects, right side: Secondary | ICD-10-CM | POA: Diagnosis not present

## 2016-01-22 ENCOUNTER — Ambulatory Visit (INDEPENDENT_AMBULATORY_CARE_PROVIDER_SITE_OTHER): Payer: Medicare Other | Admitting: *Deleted

## 2016-01-22 DIAGNOSIS — I482 Chronic atrial fibrillation, unspecified: Secondary | ICD-10-CM

## 2016-01-22 DIAGNOSIS — I480 Paroxysmal atrial fibrillation: Secondary | ICD-10-CM

## 2016-01-22 DIAGNOSIS — I214 Non-ST elevation (NSTEMI) myocardial infarction: Secondary | ICD-10-CM

## 2016-01-22 LAB — POCT INR: INR: 1.9

## 2016-01-27 ENCOUNTER — Telehealth: Payer: Self-pay | Admitting: Cardiovascular Disease

## 2016-01-27 DIAGNOSIS — I5032 Chronic diastolic (congestive) heart failure: Secondary | ICD-10-CM

## 2016-01-27 NOTE — Telephone Encounter (Signed)
Pt c/o swelling: STAT is pt has developed SOB within 24 hours  1. How long have you been experiencing swelling? 3-4 days   2. Where is the swelling located? Feet and ankles   3.  Are you currently taking a "fluid pill"?Lasix   4.  Are you currently SOB? No  5.  Have you traveled recently?no  Has had 3-4lb weight gain within the last 4 days

## 2016-01-27 NOTE — Telephone Encounter (Signed)
Spoke with pt. She reports the following weights- 10/18- 144 lbs 10/17-143 lbs 10/16-142 lbs 10/15-141 lbs. Prior to this she was averaging her normal weights of 136-139 lbs. States she usually takes Lasix 1-2 times per week. She started taking Lasix 40 mg with potassium on 10/15 and has been taking daily since that time. Swelling in both feet and ankles.  Wears brace on left foot which is held in place with velcro strap.  Usually this hangs over but strap is now very tight on brace. Shortness of breath is same as usual. She reports her sodium was low recently at primary care. Pt reports she drinks a lot of water and was told to replace this with gatorade.  She has been drinking about 32 ounces daily.  I told pt I would send to Dr. Cathie Olden for review and we would call her back.

## 2016-01-27 NOTE — Telephone Encounter (Signed)
Lets double lasix to BID for the next 3 days Have her report back to let us know of her progress.

## 2016-01-27 NOTE — Telephone Encounter (Signed)
Spoke with patient and advised her per Dr. Acie Fredrickson that she should double Lasix tomorrow and Friday.  I advised her to take Lasix 40 mg twice daily and that I will call her Friday afternoon to see if she is noticing any improvement. She states she will also reduce her intake of gatorade and states she drinks the low sugar version.  She would like to see if symptoms improve by drinking less gatorade.  She verbalized understanding and agreement with plan and thanked me for the call.

## 2016-01-28 ENCOUNTER — Telehealth: Payer: Self-pay | Admitting: Physician Assistant

## 2016-01-28 NOTE — Telephone Encounter (Signed)
Spoke to the pharmacist. He said they filled her zofran on 10-13 and the insurance company wont le them fill it again until 10-24.  The patient told me they gave her 15  Pills on 10-13 and will give her 15 on 10-24. I adavised her to call [employer], her supplement insurance and ask them about this.  I told her on 12-28-2015 we sent # 60 with 2 refills. She said she will do that and also call [facility] and talk to the pharmacist, Rory Percy.

## 2016-01-29 ENCOUNTER — Telehealth: Payer: Self-pay | Admitting: Physician Assistant

## 2016-01-29 MED ORDER — POTASSIUM CHLORIDE ER 10 MEQ PO TBCR: 10.0000 meq | EXTENDED_RELEASE_TABLET | Freq: Once | ORAL | 3 refills | Status: DC

## 2016-01-29 MED ORDER — FUROSEMIDE 40 MG PO TABS: 40.0000 mg | ORAL_TABLET | Freq: Every day | ORAL | 3 refills | Status: DC

## 2016-01-29 NOTE — Telephone Encounter (Signed)
PT CALLING BACK TO GIVE FOLLOW UP ON HOW SHE IS DOING AFTER FOLLOWING DR NAHSER'S INSTRUCTIONS FROM A FEW DAYS AGO-PLS CALL [phone]

## 2016-01-29 NOTE — Telephone Encounter (Signed)
Spoke with patient who states she is doing better.  She took an additional 40 mg of lasix Wednesday, Thursday, and will do so today.  Reports weights: Wed. 144 lb Thurs. 141 lb Fri. 140 lb  States swelling has gone down in her lower extremities, boot is loose again, and it did not hurt to put on her shoes this morning.  I advised her to take lasix 40 mg and kdur 10 meq daily from this point forward.  She requests a refill of the medication since she previously took 40 mg 4 times per week.  She is scheduled for coumadin clinic on Monday and I advised we will have her get lab work that day as well.  She verbalized understanding and agreement with plan.

## 2016-02-01 ENCOUNTER — Other Ambulatory Visit: Payer: Medicare Other | Admitting: *Deleted

## 2016-02-01 ENCOUNTER — Ambulatory Visit (INDEPENDENT_AMBULATORY_CARE_PROVIDER_SITE_OTHER): Payer: Medicare Other

## 2016-02-01 DIAGNOSIS — I482 Chronic atrial fibrillation, unspecified: Secondary | ICD-10-CM

## 2016-02-01 DIAGNOSIS — I5032 Chronic diastolic (congestive) heart failure: Secondary | ICD-10-CM | POA: Diagnosis not present

## 2016-02-01 DIAGNOSIS — I214 Non-ST elevation (NSTEMI) myocardial infarction: Secondary | ICD-10-CM

## 2016-02-01 DIAGNOSIS — I480 Paroxysmal atrial fibrillation: Secondary | ICD-10-CM | POA: Diagnosis not present

## 2016-02-01 LAB — POCT INR: INR: 2.6

## 2016-02-01 NOTE — Telephone Encounter (Signed)
I called and got the [employer] numbers off the patient's card, [employer]. Part D. ID is [id number], Group RX [id number], BIN [id number], PCN # [id number].  Called [facility] and I was told this does not need a PA for # 60 for 30 days.  I told them she only got 15 pills on 10-18 and was to get 15 pill on 10-25.  [facility] said that is not correct.  They gave me the number for [place] to call, Help desk for pharmacy- [phone]. Spoke to [place] again at [employer] center and he will call the help desk.

## 2016-02-01 NOTE — Telephone Encounter (Signed)
LM on the patient's home number answering machine.  Advised her I tried to do the Prior authorization with [employer], the patient's Part D through [employer].  I advised her on the message that her plan does not require the prior authorization. The party I spoke to said she should be able to get # 60 for 30 days on the Zofran ( Ondansetron ) .

## 2016-02-02 LAB — BASIC METABOLIC PANEL
BUN: 18 mg/dL (ref 7–25)
CHLORIDE: 91 mmol/L — AB (ref 98–110)
CO2: 28 mmol/L (ref 20–31)
CREATININE: 0.98 mg/dL — AB (ref 0.60–0.88)
Calcium: 8.7 mg/dL (ref 8.6–10.4)
Glucose, Bld: 82 mg/dL (ref 65–99)
Potassium: 5.2 mmol/L (ref 3.5–5.3)
Sodium: 128 mmol/L — ABNORMAL LOW (ref 135–146)

## 2016-02-04 DIAGNOSIS — L570 Actinic keratosis: Secondary | ICD-10-CM | POA: Diagnosis not present

## 2016-02-09 DIAGNOSIS — G5603 Carpal tunnel syndrome, bilateral upper limbs: Secondary | ICD-10-CM | POA: Diagnosis not present

## 2016-02-24 ENCOUNTER — Ambulatory Visit (INDEPENDENT_AMBULATORY_CARE_PROVIDER_SITE_OTHER): Payer: Medicare Other | Admitting: Pharmacist

## 2016-02-24 DIAGNOSIS — M542 Cervicalgia: Secondary | ICD-10-CM | POA: Diagnosis not present

## 2016-02-24 DIAGNOSIS — I214 Non-ST elevation (NSTEMI) myocardial infarction: Secondary | ICD-10-CM | POA: Diagnosis not present

## 2016-02-24 DIAGNOSIS — I480 Paroxysmal atrial fibrillation: Secondary | ICD-10-CM | POA: Diagnosis not present

## 2016-02-24 DIAGNOSIS — M5416 Radiculopathy, lumbar region: Secondary | ICD-10-CM | POA: Diagnosis not present

## 2016-02-24 DIAGNOSIS — Z6826 Body mass index (BMI) 26.0-26.9, adult: Secondary | ICD-10-CM | POA: Diagnosis not present

## 2016-02-24 DIAGNOSIS — M545 Low back pain: Secondary | ICD-10-CM | POA: Diagnosis not present

## 2016-02-24 LAB — POCT INR: INR: 2.4

## 2016-03-09 ENCOUNTER — Telehealth: Payer: Self-pay | Admitting: Cardiovascular Disease

## 2016-03-09 NOTE — Telephone Encounter (Signed)
New Message  Pt voiced having trouble urinating and has gained a little weight.  Pt voiced she has been drinking a lot of fluids because of her flu but it has not expelled.  Pt voiced needing nurse to call her.  Please f/u

## 2016-03-09 NOTE — Telephone Encounter (Signed)
Spoke with patient who states she has been down for > 1 week with the flu.  States she is moving around more today but she is not urinating as frequent as usual - states significantly less than usual.  She states her appetite has been poor.  She denies SOB or swelling and states loss of weight has been due to not eating very much.  She states she has been drinking a lot of fluids.  I advised her that per Dr. Acie Fredrickson, who is in the office, she may take an additional lasix 40 mg today.  She asked if she could do the same tomorrow and I advised her to monitor for signs of weight gain, SOB, or swelling and to call the office if she does not see an increase in urinary output.  She verbalized understanding and agreement with plan and thanked me for the call.

## 2016-03-11 ENCOUNTER — Other Ambulatory Visit: Payer: Self-pay | Admitting: Cardiovascular Disease

## 2016-03-12 IMAGING — RF DG ESOPHAGUS
10 of 11 series · 14 of 24 positions shown · non-contrast
Comparison: None.

CLINICAL DATA: Difficulty swallowing.

EXAM:
ESOPHOGRAM / BARIUM SWALLOW / BARIUM TABLET STUDY
TECHNIQUE: Combined double contrast and single contrast examination performed
using effervescent crystals, thick barium liquid, and thin barium
liquid. The patient was observed with fluoroscopy swallowing a 13 mm
barium sulphate tablet.
FLUOROSCOPY TIME:  Radiation Exposure Index (as provided by the
fluoroscopic device):
If the device does not provide the exposure index:
Fluoroscopy Time:  1 minutes and 36 seconds
Number of Acquired Images:

[Series 1: fluoro_barium 2fps_bw · 0.17mm/px · 2 of 3 frames shown (1 of 7)]
[frame 1/3]
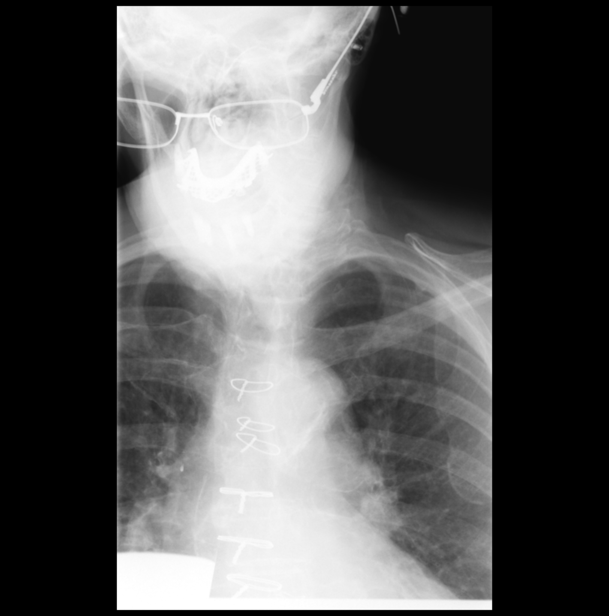
[frame 3/3]
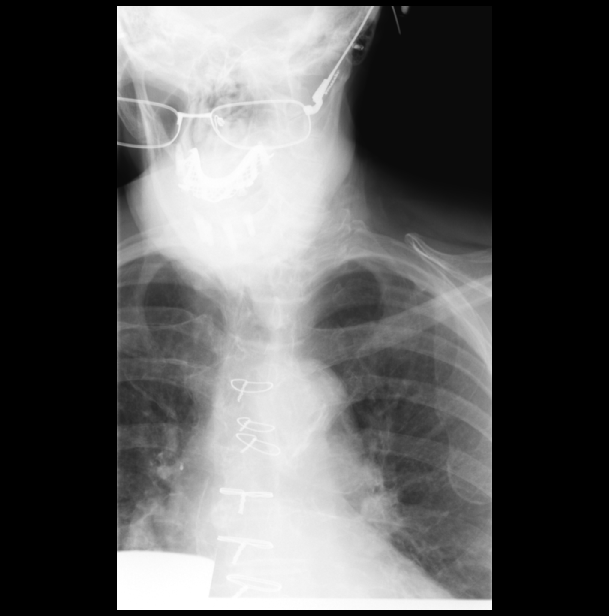

[Series 2: fluoro_barium 2fps_bw · 0.17mm/px · 1 of 13 frames shown (2 of 7)]
[frame 7/13]
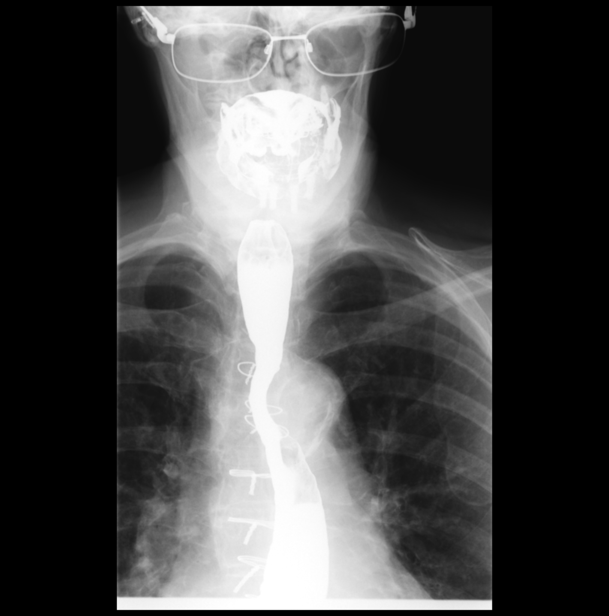

[Series 3: fluoro_barium 2fps_bw · 0.17mm/px · 2 of 26 frames shown (3 of 7)]
[frame 4/26]
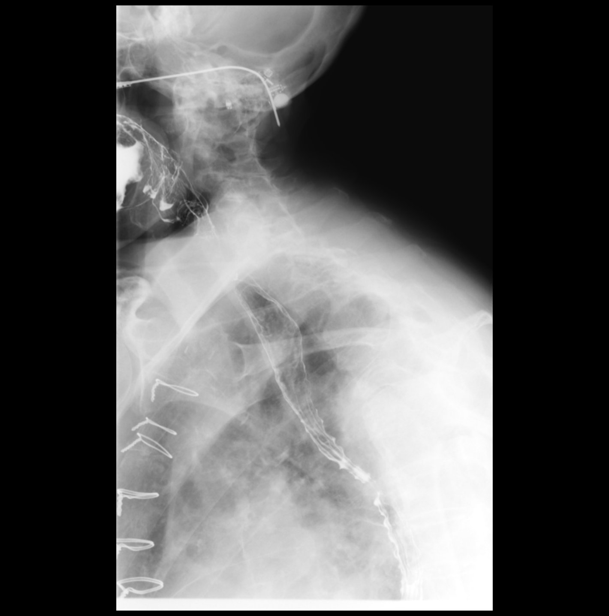
[frame 14/26]
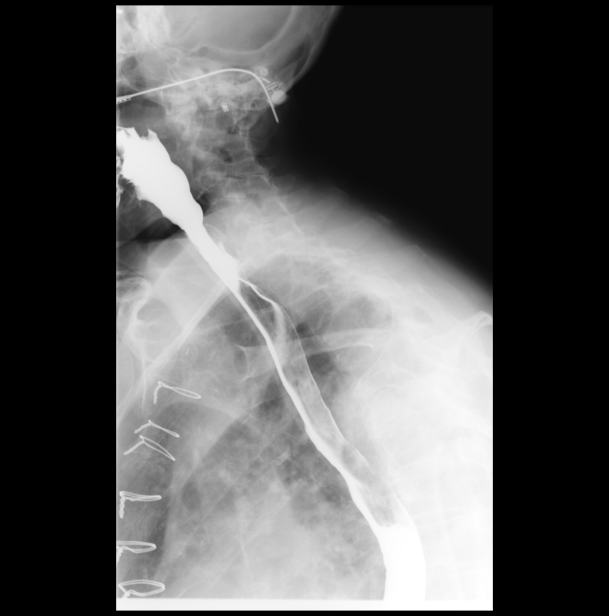

[Series 4: fluoro_barium 2fps_bw · 0.18mm/px · 2 of 8 frames shown (4 of 7)]
[frame 2/8]
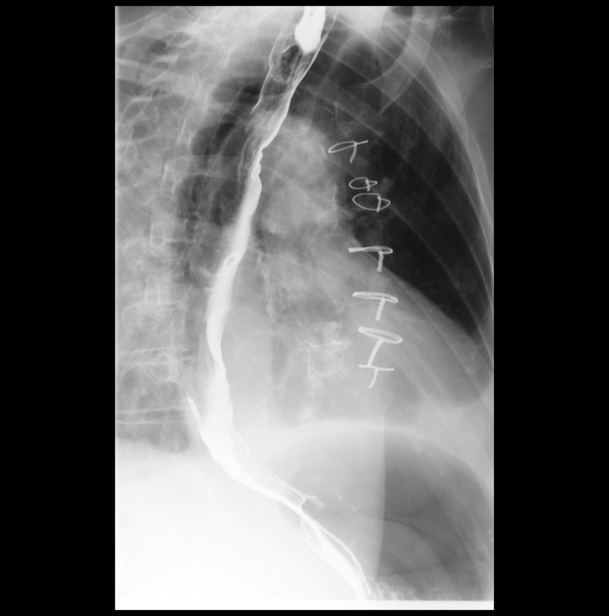
[frame 7/8]
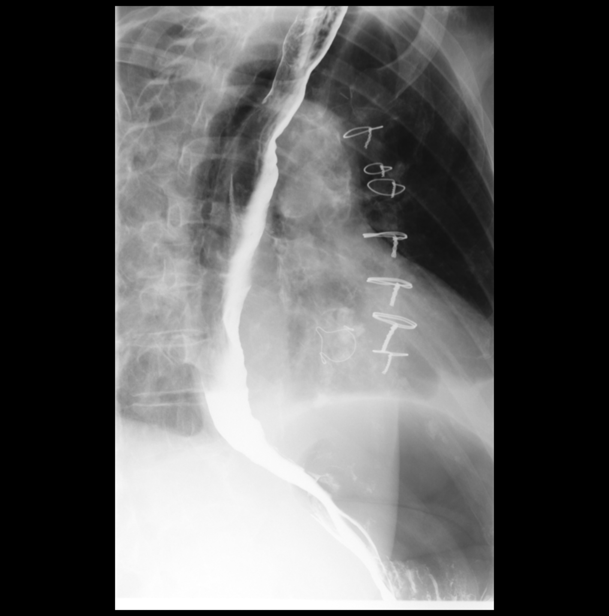

[Series 6: fluoro_barium 2fps_bw · 0.18mm/px · 2 of 4 frames shown (5 of 7)]
[frame 1/4]
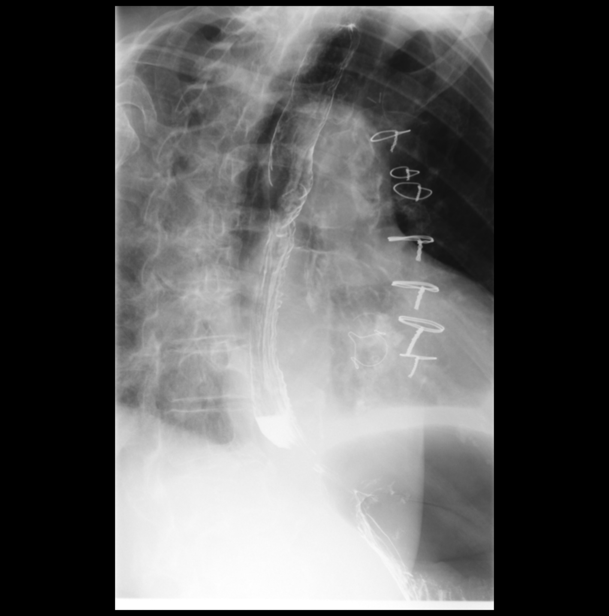
[frame 4/4]
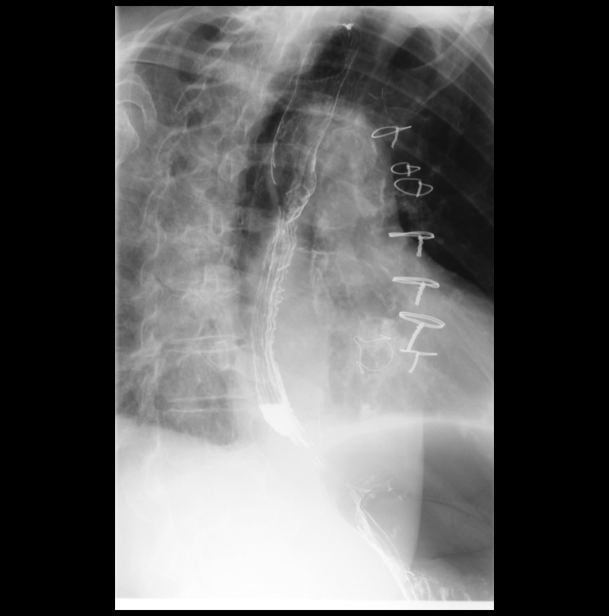

[Series 7: fluoro_barium 2fps_bw · 0.18mm/px · 1 of 11 frames shown (6 of 7)]
[frame 2/11]
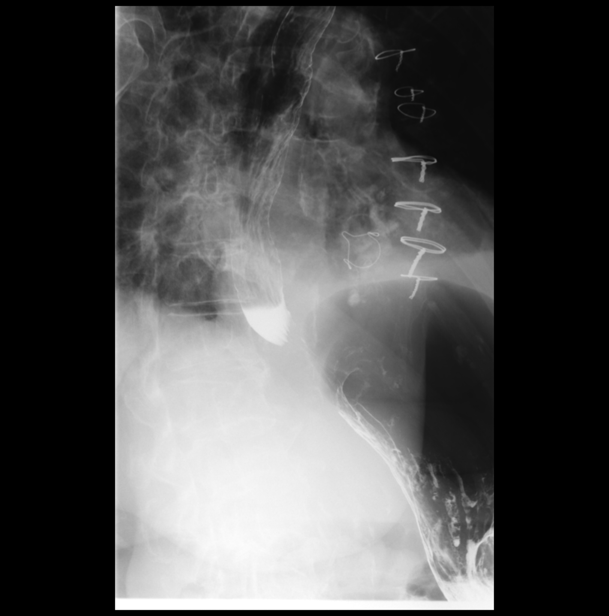

[Series 8: cp_standard · 0.27mm/px · 1 of 1 slices shown (1 of 3)]
[im 1/1]
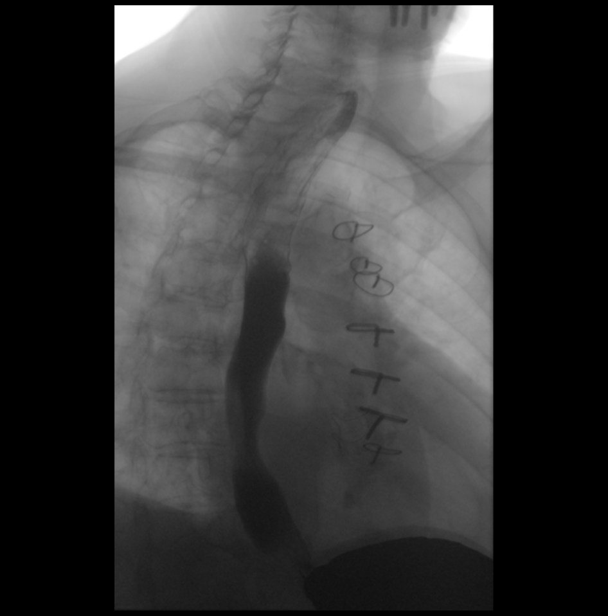

[Series 9: fluoro_barium 2fps_bw · 0.18mm/px · 1 of 6 frames shown (7 of 7)]
[frame 1/6]
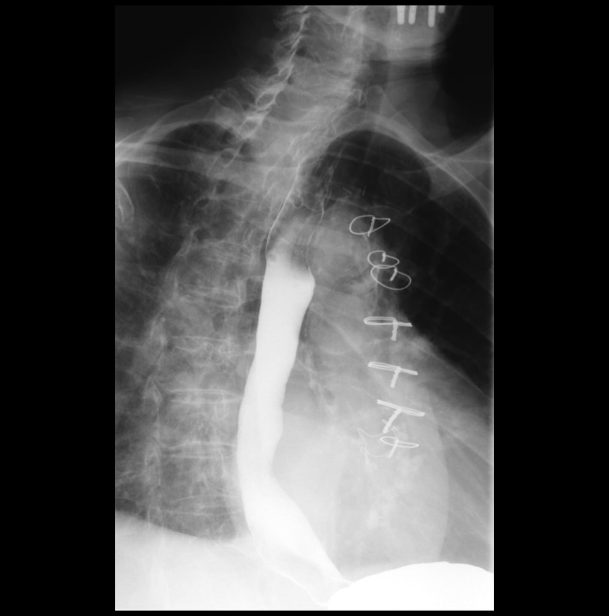

[Series 10: cp_standard · 0.27mm/px · 1 of 1 slices shown (2 of 3)]
[im 1/1]
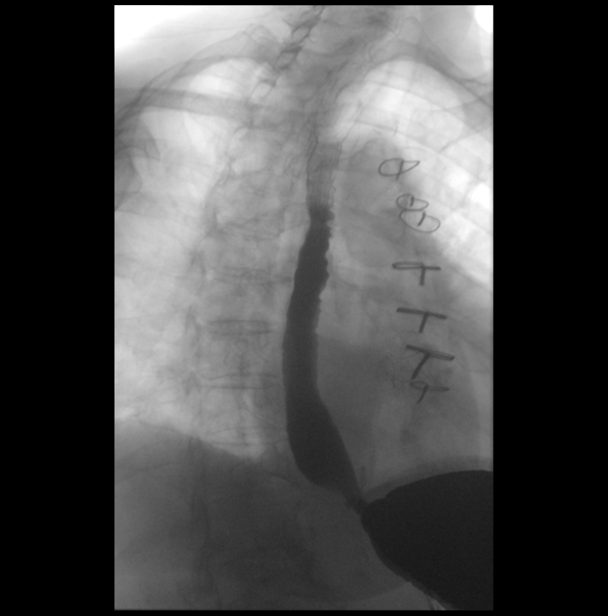

[Series 12: cp_standard · 0.27mm/px · 1 of 1 slices shown (3 of 3)]
[im 1/1]
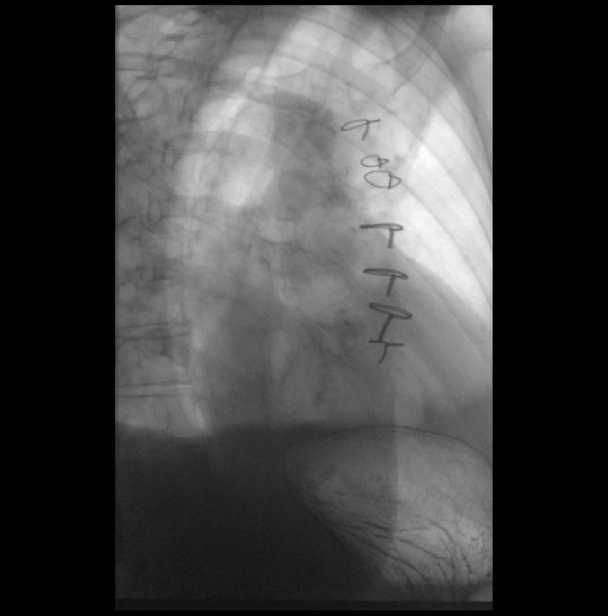

[14 of 24 positions shown; findings below may reference images not displayed]

FINDINGS: Frontal and lateral views of the hypopharynx while swallowing are
unremarkable. Double contrast imaging of the esophagus shows no
evidence for diverticulum. No esophageal stricture or mass lesion.
No evidence for mucosal ulceration. No hiatal hernia.

Patient was placed LPO relative to the fluoro table and monitored
fluoroscopically while swallowing. This demonstrates disruption of
primary peristalsis on all swallows without substantial tertiary
contractions are evidence of presbyesophagus.

Returning joint upright position, a 13 mm barium tablet passes
readily into the stomach when taken with water.
IMPRESSION: Nonspecific esophageal motility disorder. Otherwise normal double
contrast barium esophagram.

## 2016-03-21 ENCOUNTER — Ambulatory Visit (INDEPENDENT_AMBULATORY_CARE_PROVIDER_SITE_OTHER): Payer: Medicare Other | Admitting: *Deleted

## 2016-03-21 DIAGNOSIS — I214 Non-ST elevation (NSTEMI) myocardial infarction: Secondary | ICD-10-CM

## 2016-03-21 DIAGNOSIS — N3946 Mixed incontinence: Secondary | ICD-10-CM | POA: Diagnosis not present

## 2016-03-21 DIAGNOSIS — R351 Nocturia: Secondary | ICD-10-CM | POA: Diagnosis not present

## 2016-03-21 DIAGNOSIS — N3010 Interstitial cystitis (chronic) without hematuria: Secondary | ICD-10-CM | POA: Diagnosis not present

## 2016-03-21 LAB — POCT INR: INR: 2.5

## 2016-04-11 ENCOUNTER — Other Ambulatory Visit: Payer: Self-pay | Admitting: Physician Assistant

## 2016-04-14 ENCOUNTER — Encounter: Payer: Self-pay | Admitting: Gastroenterology

## 2016-04-14 ENCOUNTER — Encounter (INDEPENDENT_AMBULATORY_CARE_PROVIDER_SITE_OTHER): Payer: Self-pay

## 2016-04-14 ENCOUNTER — Ambulatory Visit (INDEPENDENT_AMBULATORY_CARE_PROVIDER_SITE_OTHER): Payer: Medicare Other | Admitting: Gastroenterology

## 2016-04-14 VITALS — BP 142/70 | HR 88 | Ht 61.0 in | Wt 136.0 lb

## 2016-04-14 DIAGNOSIS — R1013 Epigastric pain: Secondary | ICD-10-CM | POA: Diagnosis not present

## 2016-04-14 DIAGNOSIS — R110 Nausea: Secondary | ICD-10-CM

## 2016-04-14 MED ORDER — BUSPIRONE HCL 15 MG PO TABS: 15.0000 mg | ORAL_TABLET | Freq: Two times a day (BID) | ORAL | 1 refills | Status: DC

## 2016-04-14 NOTE — Progress Notes (Signed)
04/14/2016 Brittany [name] BY:[id number] [birth year]   HISTORY OF PRESENT ILLNESS:  This is an 83 year old female who is known to Dr. Havery Moros for complaints of ongoing nausea and dyspepsia. Please see his note from 09/01/2015 for comprehensive information regarding this. This is thought to be secondary to polypharmacy and possibly a component of narcotic bowel. At her last visit it was discussed with her regarding trial of FDgard vs buspirone and she elected to try the [place] first.  Comes back today with the same complaints. Says that she never tried the [place] and she does not even recall discussion about that.  Describes waking up with nausea in the morning and having to take Phenergan first thing every day. Says that Zofran does not do much for her. Then has a lot of postprandial nausea and discomfort and significant fullness even though she eats small meals.   Past Medical History:  Diagnosis Date  . Arthritis   . Atrial fibrillation ([place])   . Chronic back pain   . Diverticulosis   . Dyslipidemia    takes Lipitor daily  . Dysrhythmia    Atrial Fibrillation  . Foot drop    SINCE 1987  . GERD (gastroesophageal reflux disease)   . H/O hiatal hernia   . Hearing impaired    Bilateral hearing aids  . History of bacterial endocarditis   . History of gastritis   . History of stomach ulcers    many yrs ago  . History of TIA (transient ischemic attack)    2013-- RESIDUAL PERIPHERAL VISION RIGHT EYE--  RESOLVED  . Hypertension   . IC (interstitial cystitis)   . Pulmonary hypertension 10/2013   Based on TTE  . S/P aortic valve replacement    2005  . Urine incontinence    Past Surgical History:  Procedure Laterality Date  . ABDOMINAL HYSTERECTOMY  1967  . AORTIC VALVE REPLACEMENT  09-29-2003    [facility] pericardial tissue valve  . APPENDECTOMY  1933  . CARDIAC CATHETERIZATION  07-15-2003 DR HELEN PRESTON   MODERATE TO MODERATELY SEVERE CALCIFIC AORTIC STENOSIS/   NORMAL CORONARY ARTERIES  . CARDIOVASCULAR STRESS TEST  01-30-2012  DR NASHER   NORMAL NUCLEAR STUDY/  EF 73%/  NORMAL LVF  . CARPAL TUNNEL RELEASE Bilateral   . CATARACT EXTRACTION W/ INTRAOCULAR LENS  IMPLANT, BILATERAL    . CHOLECYSTECTOMY  1993  . CYSTO WITH HYDRODISTENSION N/A 02/05/2013   Procedure: CYSTOSCOPY/HYDRODISTENSION;  Surgeon: Irine Seal, MD;  Location: [facility];  Service: Urology;  Laterality: N/A;  . CYSTO/ HYDRODISTENTION/ INSTILLATION CLORPACTIN  MULTIPLE  last one 2009  . DILATION AND CURETTAGE OF UTERUS    . ERCP N/A 08/10/2012   Procedure: ENDOSCOPIC RETROGRADE CHOLANGIOPANCREATOGRAPHY (ERCP);  Surgeon: Inda Castle, MD;  Location: [place];  Service: Gastroenterology;  Laterality: N/A;  . ESOPHAGOGASTRODUODENOSCOPY (EGD) WITH PROPOFOL N/A 08/18/2014   Procedure: ESOPHAGOGASTRODUODENOSCOPY (EGD) WITH PROPOFOL;  Surgeon: Inda Castle, MD;  Location: [facility];  Service: Endoscopy;  Laterality: N/A;  . LEFT HEART CATHETERIZATION WITH CORONARY ANGIOGRAM N/A 10/18/2013   Procedure: LEFT HEART CATHETERIZATION WITH CORONARY ANGIOGRAM;  Surgeon: Jettie Booze, MD;  Location: [facility] CATH LAB;  Service: Cardiovascular;  Laterality: N/A;  . LUMBAR [place] &  2007  . MACROPLASTIQUE URETHRAL IMPLANTATION  08-27-2009  . MINI-OPEN RIGHT ROTATOR CUFF REPAIR  07-12-2001  . RIGHT SHOULDER ARTHROSCOPY W/ DEBRIDEMENT ROTATOR CUFF AND LABRAL TEAR/ ACROMINOPLASTY/ DISTAL CLAVICLE EXCISION/  CA LIGAMENT RELEASE  10-08-1999  . TONSILLECTOMY AND ADENOIDECTOMY    . TRANSTHORACIC ECHOCARDIOGRAM  08-10-2011   MILD LVH/  EF 55-60%/  NORMAL AVR TISSUE/ MILD MR/  MODERATE DILATED LA/  MILD DILATED RA  . TRANSVAGINAL TAPE PROCEDURE  07-30-2002  . VEIN LIGATION  1969   RIGHT LOWER LEG    reports that she quit smoking about 26 years ago. Her smoking use included Cigarettes. She started smoking about 66 years ago. She has a 20.00 pack-year smoking history. She has never  used smokeless tobacco. She reports that she does not drink alcohol or use drugs. family history includes Alcohol abuse in her sister and sister; Bipolar disorder in her brother and son; Colon cancer in her maternal aunt; Coronary artery disease in her brother; Esophageal cancer in her maternal grandfather; Heart disease in her father; Stomach cancer in her maternal grandmother. Allergies  Allergen Reactions  . Amlodipine     Other reaction(s): edema  . Amoxicillin Diarrhea and Nausea And Vomiting  . Carafate [Sucralfate] Swelling    Swelling in knees and blocky red marks on them.  . Cephalexin Diarrhea  . Codeine Nausea Only  . Irbesartan Swelling    Facial and hand numbness and swelling. Patient believes reaction was with this medication but isn't 100% sure. She is currently tolerating lisinopril.  Marland Kitchen Morphine And Related Other (See Comments)    HX ADDICTION / WITHDRAWAL  . Neurontin [Gabapentin] Swelling    Legs swell  . Nitrofurantoin Monohyd Macro Diarrhea and Nausea Only  . Sulfa Antibiotics Rash      Outpatient Encounter Prescriptions as of 04/14/2016  Medication Sig  . atorvastatin (LIPITOR) 20 MG tablet Take 20 mg by mouth daily.  . diazepam (VALIUM) 5 MG tablet Take 5 mg by mouth every 8 (eight) hours as needed for anxiety or muscle spasms.   Marland Kitchen diltiazem (CARTIA XT) 120 MG 24 hr capsule Take 120 mg by mouth 2 (two) times daily.  . furosemide (LASIX) 40 MG tablet Take 1 tablet (40 mg total) by mouth daily.  Marland Kitchen HYDROcodone-acetaminophen (NORCO/VICODIN) 5-325 MG per tablet Take 1 tablet by mouth every 6 (six) hours as needed.  . hyoscyamine (LEVSIN, ANASPAZ) 0.125 MG tablet Take 0.125 mg by mouth every 4 (four) hours as needed.  Marland Kitchen ipratropium (ATROVENT) 0.06 % nasal spray 1-2 sprays each nostril twice daily  . lisinopril (PRINIVIL,ZESTRIL) 40 MG tablet Take 20 mg by mouth daily.   Marland Kitchen LORazepam (ATIVAN) 0.5 MG tablet TAKE 1 TABLET BY MOUTH TWICE DAILY  . ondansetron (ZOFRAN) 4 MG  tablet TAKE 1 TABLET BY MOUTH TWICE DAILY  . oxybutynin (DITROPAN) 5 MG tablet Take 5 mg by mouth 3 (three) times daily.   . pantoprazole (PROTONIX) 40 MG tablet TAKE 1 TABLET (40 MG TOTAL) BY MOUTH DAILY.  Marland Kitchen potassium chloride (K-DUR) 10 MEQ tablet Take 1 tablet (10 mEq total) by mouth once. (Patient taking differently: Take 10 mEq by mouth daily. )  . promethazine (PHENERGAN) 12.5 MG tablet Take 1 tablet (12.5 mg total) by mouth every 6 (six) hours as needed for nausea or vomiting.  . ranitidine (ZANTAC) 150 MG tablet Take 150 mg by mouth at bedtime.  Marland Kitchen warfarin (COUMADIN) 5 MG tablet TAKE 1 TABLET BY MOUTH DAILY OR AS DIRECTED BY COUMADIN CLINIC.   No facility-administered encounter medications on file as of 04/14/2016.      REVIEW OF SYSTEMS  : All other systems reviewed and negative except where noted in the History  of Present Illness.   PHYSICAL EXAM: BP (!) 142/70 (BP Location: Left Arm, Patient Position: Sitting, Cuff Size: Normal)   Pulse 88   Ht 5\' 1"  (1.549 m) Comment: height measured without shoes  Wt 136 lb (61.7 kg)   BMI 25.70 kg/m  General: Well developed white female in no acute distress Head: Normocephalic and atraumatic Eyes:  Sclerae anicteric,conjunctive pink. Ears: Normal auditory acuity Lungs: Clear throughout to auscultation.  No increased WOB. Heart: Regular rate and rhythm Abdomen: Soft, non-distended.  Normal bowel sounds.  Non-tender. Musculoskeletal: Symmetrical with no gross deformities  Skin: No lesions on visible extremities Extremities: No edema  Neurological: Alert oriented x 4, grossly non-focal Psychological:  Alert and cooperative. Normal mood and affect  ASSESSMENT AND PLAN: -83 year old female with chronic nausea and dyspepsia.  Thought to likely be due to polypharmacy and a component of narcotic bowel.  Never tried FDgard as discussed at her last visit.  Buspirone also discussed.  Will try this at 15 mg BID to start.  Can try FDgard prn as  well.  Will continue this regimen until follow-up with Dr. Havery Moros in 6-8 weeks.   CC:  Mayra Neer, MD

## 2016-04-14 NOTE — Progress Notes (Signed)
Agree with assessment and plan as outlined.  

## 2016-04-14 NOTE — Patient Instructions (Signed)
Please purchase the following medications over the counter and take as directed: FD guard  We have sent the following medications to your pharmacy for you to pick up at your convenience: Buspirone 15 mg twice a day

## 2016-05-02 ENCOUNTER — Ambulatory Visit (INDEPENDENT_AMBULATORY_CARE_PROVIDER_SITE_OTHER): Payer: Medicare Other

## 2016-05-02 DIAGNOSIS — I214 Non-ST elevation (NSTEMI) myocardial infarction: Secondary | ICD-10-CM | POA: Diagnosis not present

## 2016-05-02 LAB — POCT INR: INR: 3.1

## 2016-05-14 ENCOUNTER — Other Ambulatory Visit: Payer: Self-pay | Admitting: Gastroenterology

## 2016-05-16 DIAGNOSIS — M19011 Primary osteoarthritis, right shoulder: Secondary | ICD-10-CM | POA: Diagnosis not present

## 2016-05-17 ENCOUNTER — Ambulatory Visit (INDEPENDENT_AMBULATORY_CARE_PROVIDER_SITE_OTHER): Payer: Medicare Other | Admitting: Pulmonary Disease

## 2016-05-17 ENCOUNTER — Encounter: Payer: Self-pay | Admitting: Pulmonary Disease

## 2016-05-17 VITALS — BP 118/52 | HR 57 | Ht 61.0 in | Wt 138.0 lb

## 2016-05-17 DIAGNOSIS — Z23 Encounter for immunization: Secondary | ICD-10-CM

## 2016-05-17 DIAGNOSIS — J432 Centrilobular emphysema: Secondary | ICD-10-CM

## 2016-05-17 DIAGNOSIS — K219 Gastro-esophageal reflux disease without esophagitis: Secondary | ICD-10-CM

## 2016-05-17 DIAGNOSIS — I214 Non-ST elevation (NSTEMI) myocardial infarction: Secondary | ICD-10-CM

## 2016-05-17 DIAGNOSIS — I272 Pulmonary hypertension, unspecified: Secondary | ICD-10-CM | POA: Diagnosis not present

## 2016-05-17 DIAGNOSIS — J309 Allergic rhinitis, unspecified: Secondary | ICD-10-CM | POA: Diagnosis not present

## 2016-05-17 NOTE — Patient Instructions (Signed)
   Continue taking your medications as prescribed.  Call me if you have any new breathing problems as I would be happy to see you back sooner if needed.  I will see you back in 1 year.

## 2016-05-17 NOTE — Progress Notes (Signed)
Subjective:    Patient ID: Brittany [name], female    DOB: 1927/07/02, 83 y.o.   MRN: [id number]  C.C.:  Follow-up for Centrilobular Emphysema, Pulmonary Hypertension, Chronic Allergic Rhinitis, GERD, & Mediastinal Adenopathy.  HPI Centrilobular Emphysema: Previously attempted treatment with Incruse Ellipta without symptomatic benefit. She reports an intermittent cough that is nonproductive. She reports no change in her baseline dyspnea. No exacerbations since last appointment.   Pulmonary Hypertension: Multifactorial from underlying emphysema as well as left ventricular dysfunction. Currently on diuretic therapy with Lasix. Not currently on pulmonary arterial vasodilator therapy. She reports now she is taking a Lasix daily. She reports her weight has been largely stable and fluctuating less. She is weighing herself every morning. She endorses less edema.  Chronic Allergic Rhinitis:  Previously using over-the-counter antihistamine therapy. She denies any sinus congestion or pressure. She does however have some mild drainage, mostly in the morning.   GERD: Currently prescribed Protonix and Zantac. Following with GI. No reflux or dyspepsia depending on dietary indiscretion. No morning brash water taste.   Mediastinal Adenopathy: Enlargement first appreciated June 2016. Largest lymph node is precarinal and measures 1.2 cm. No significant change on recent CT imaging this month. This has been stable since June 2016. Holding off on further imaging at this time. Patient  Review of Systems No chest pain, tightness, or pressure. No fever or chills. No abdominal pain or nausea.   Allergies  Allergen Reactions  . Amlodipine     Other reaction(s): edema  . Amoxicillin Diarrhea and Nausea And Vomiting  . Carafate [Sucralfate] Swelling    Swelling in knees and blocky red marks on them.  . Cephalexin Diarrhea  . Codeine Nausea Only  . Irbesartan Swelling    Facial and hand numbness and swelling.  Patient believes reaction was with this medication but isn't 100% sure. She is currently tolerating lisinopril.  Marland Kitchen Morphine And Related Other (See Comments)    HX ADDICTION / WITHDRAWAL  . Neurontin [Gabapentin] Swelling    Legs swell  . Nitrofurantoin Monohyd Macro Diarrhea and Nausea Only  . Sulfa Antibiotics Rash   Current Outpatient Prescriptions on File Prior to Visit  Medication Sig Dispense Refill  . atorvastatin (LIPITOR) 20 MG tablet Take 20 mg by mouth daily.    . busPIRone (BUSPAR) 15 MG tablet Take 1 tablet (15 mg total) by mouth 2 (two) times daily. 60 tablet 1  . diazepam (VALIUM) 5 MG tablet Take 5 mg by mouth every 8 (eight) hours as needed for anxiety or muscle spasms.     Marland Kitchen diltiazem (CARTIA XT) 120 MG 24 hr capsule Take 120 mg by mouth 2 (two) times daily.    . furosemide (LASIX) 40 MG tablet Take 1 tablet (40 mg total) by mouth daily. 90 tablet 3  . HYDROcodone-acetaminophen (NORCO/VICODIN) 5-325 MG per tablet Take 1 tablet by mouth every 6 (six) hours as needed.    . hyoscyamine (LEVSIN, ANASPAZ) 0.125 MG tablet Take 0.125 mg by mouth every 4 (four) hours as needed.    Marland Kitchen ipratropium (ATROVENT) 0.06 % nasal spray 1-2 sprays each nostril twice daily    . lisinopril (PRINIVIL,ZESTRIL) 40 MG tablet Take 20 mg by mouth daily.     Marland Kitchen LORazepam (ATIVAN) 0.5 MG tablet TAKE 1 TABLET BY MOUTH TWICE DAILY 60 tablet 2  . ondansetron (ZOFRAN) 4 MG tablet TAKE 1 TABLET BY MOUTH TWICE DAILY 60 tablet 1  . oxybutynin (DITROPAN) 5 MG tablet Take 5 mg by mouth  3 (three) times daily.     . pantoprazole (PROTONIX) 40 MG tablet TAKE ONE TABLET BY MOUTH DAILY 30 tablet 0  . promethazine (PHENERGAN) 12.5 MG tablet Take 1 tablet (12.5 mg total) by mouth every 6 (six) hours as needed for nausea or vomiting. 30 tablet 0  . ranitidine (ZANTAC) 150 MG tablet Take 150 mg by mouth at bedtime.    Marland Kitchen warfarin (COUMADIN) 5 MG tablet TAKE 1 TABLET BY MOUTH DAILY OR AS DIRECTED BY COUMADIN CLINIC. 30 tablet  3  . potassium chloride (K-DUR) 10 MEQ tablet Take 1 tablet (10 mEq total) by mouth once. (Patient taking differently: Take 10 mEq by mouth daily. ) 90 tablet 3   No current facility-administered medications on file prior to visit.    Past Medical History:  Diagnosis Date  . Arthritis   . Atrial fibrillation ([place])   . Chronic back pain   . Diverticulosis   . Dyslipidemia    takes Lipitor daily  . Dysrhythmia    Atrial Fibrillation  . Foot drop    SINCE 1987  . GERD (gastroesophageal reflux disease)   . H/O hiatal hernia   . Hearing impaired    Bilateral hearing aids  . History of bacterial endocarditis   . History of gastritis   . History of stomach ulcers    many yrs ago  . History of TIA (transient ischemic attack)    2013-- RESIDUAL PERIPHERAL VISION RIGHT EYE--  RESOLVED  . Hypertension   . IC (interstitial cystitis)   . Pulmonary hypertension 10/2013   Based on TTE  . S/P aortic valve replacement    2005  . Urine incontinence    Past Surgical History:  Procedure Laterality Date  . ABDOMINAL HYSTERECTOMY  1967  . AORTIC VALVE REPLACEMENT  09-29-2003    [facility] pericardial tissue valve  . APPENDECTOMY  1933  . CARDIAC CATHETERIZATION  07-15-2003 DR HELEN PRESTON   MODERATE TO MODERATELY SEVERE CALCIFIC AORTIC STENOSIS/  NORMAL CORONARY ARTERIES  . CARDIOVASCULAR STRESS TEST  01-30-2012  DR NASHER   NORMAL NUCLEAR STUDY/  EF 73%/  NORMAL LVF  . CARPAL TUNNEL RELEASE Bilateral   . CATARACT EXTRACTION W/ INTRAOCULAR LENS  IMPLANT, BILATERAL    . CHOLECYSTECTOMY  1993  . CYSTO WITH HYDRODISTENSION N/A 02/05/2013   Procedure: CYSTOSCOPY/HYDRODISTENSION;  Surgeon: Irine Seal, MD;  Location: [facility];  Service: Urology;  Laterality: N/A;  . CYSTO/ HYDRODISTENTION/ INSTILLATION CLORPACTIN  MULTIPLE  last one 2009  . DILATION AND CURETTAGE OF UTERUS    . ERCP N/A 08/10/2012   Procedure: ENDOSCOPIC RETROGRADE CHOLANGIOPANCREATOGRAPHY (ERCP);   Surgeon: Inda Castle, MD;  Location: [facility];  Service: Gastroenterology;  Laterality: N/A;  . ESOPHAGOGASTRODUODENOSCOPY (EGD) WITH PROPOFOL N/A 08/18/2014   Procedure: ESOPHAGOGASTRODUODENOSCOPY (EGD) WITH PROPOFOL;  Surgeon: Inda Castle, MD;  Location: [facility];  Service: Endoscopy;  Laterality: N/A;  . LEFT HEART CATHETERIZATION WITH CORONARY ANGIOGRAM N/A 10/18/2013   Procedure: LEFT HEART CATHETERIZATION WITH CORONARY ANGIOGRAM;  Surgeon: Jettie Booze, MD;  Location: [facility] CATH LAB;  Service: Cardiovascular;  Laterality: N/A;  . LUMBAR [place] &  2007  . MACROPLASTIQUE URETHRAL IMPLANTATION  08-27-2009  . MINI-OPEN RIGHT ROTATOR CUFF REPAIR  07-12-2001  . RIGHT SHOULDER ARTHROSCOPY W/ DEBRIDEMENT ROTATOR CUFF AND LABRAL TEAR/ ACROMINOPLASTY/ DISTAL CLAVICLE EXCISION/ CA LIGAMENT RELEASE  10-08-1999  . TONSILLECTOMY AND ADENOIDECTOMY    . TRANSTHORACIC ECHOCARDIOGRAM  08-10-2011   MILD LVH/  EF 55-60%/  NORMAL AVR TISSUE/ MILD MR/  MODERATE DILATED LA/  MILD DILATED RA  . TRANSVAGINAL TAPE PROCEDURE  07-30-2002  . VEIN LIGATION  1969   RIGHT LOWER LEG   Family History  Problem Relation Age of Onset  . Heart disease Father   . Stomach cancer Maternal Grandmother   . Esophageal cancer Maternal Grandfather   . Bipolar disorder Son     Committed Suicide  . Coronary artery disease Brother   . Bipolar disorder Brother     commited suiside at 37yo  . Alcohol abuse Sister     sister #1  . Alcohol abuse Sister     sister #2  . Colon cancer Maternal Aunt   . Lung disease Neg Hx    Social History   Social History  . Marital status: Widowed    Spouse name: N/A  . Number of children: 4  . Years of education: N/A   Occupational History  . retired    Social History Main Topics  . Smoking status: Former Smoker    Packs/day: 0.50    Years: 40.00    Types: Cigarettes    Start date: 05/06/1949    Quit date: 03/06/1990  . Smokeless tobacco: Never Used  .  Alcohol use No     Comment: Remote social EtOH  . Drug use: No  . Sexual activity: No   Other Topics Concern  . None   Social History Narrative   She is originally from [place], [place]. She has previously lived in [place] for 10 years. She has traveled to all 19 states as well as every [place] in [place]. Prior travel to [place], [place], [place]. Previously worked as an Optometrist. Remote exposure to a parakeet for 2 years in 35s. No known asbestos, mold, or hot tub exposure. She enjoys doing needle point & hand crafts.        Objective:   Physical Exam BP (!) 118/52 (BP Location: Left Arm, Cuff Size: Normal)   Pulse (!) 57   Ht 5\' 1"  (1.549 m)   Wt 138 lb (62.6 kg)   SpO2 98%   BMI 26.07 kg/m   General:  Awake. Alert. Frail, elderly female.  Integument:  Warm & dry. No rash on exposed skin.  Extremities:  No cyanosis or clubbing.  HEENT:  Moist mucus membranes. No scleral injection or icterus. No nasal turbinate swelling. Cardiovascular:  Regular rate. No edema. Minimal aortic murmur appreciated.  Pulmonary:  Overall normal work of breathing on room air. Speaking in complete sentences. Clear with auscultation. Abdomen: Soft. Normal bowel sounds. Nondistended. G Musculoskeletal:  Normal bulk and tone.  No joint deformity appreciated.  PFT 03/26/15: FVC 2.15 L (107%) FEV1 1.52 L (105%) FEV1/FVC 0.71 FEF 25-75 1.00 L (107%) no bronchodilator response 12/26/14:FVC 2.09 L (104%) FEV1 1.49 L (101%) FEV1/FVC 0.71 FEF 25-75 0.71 L (75%) no bronchodilator response TLC 3.69 L (80%) RV 65% DLCO uncorrected 62%  6MWT 12/26/14: Walked 180 meters / Baseline Sat 99% on RA / Nadir Sat 99% on RA (c/o hip/leg/calf pain)  IMAGING CT CHEST W/O 11/17/15 (previously reviewed by me): Precarinal lymph node measuring 1.3 cm in short axis. No new lymphadenopathy. No pleural effusion or thickening. Upper lobe emphysematous changes unchanged. No pericardial effusion. Borderline cardiomegaly.  ESOPHAGRAM (06/15/15):  Nonspecific esophageal motility disorder. No stricture or mass. No mucosal ulceration. No hiatal hernia.  CT CHEST W/O 06/03/15 (previously reviewed by me): 1.3 cm right paratracheal lymph node that is  precarinal unchanged. No other nodule or opacity appreciated. No pathologic mediastinal adenopathy otherwise. Apical predominant centrilobular emphysema unchanged. Right pleural effusion versus thickening that is mild and new compared with prior CT. No pericardial effusion.  CT CHEST W/O 12/31/14 (previously reviewed by me): Left lower lobe density resolved. Mild cardiomegaly without pericardial effusion. No pleural effusion or thickening. Apical predominant centrilobular emphysema. Precarinal lymph node measuring 1.2 cm in short axis.  SWALLOW EVALUATION (12/11/14): No evidence for aspiration..  CXR PA/LAT 10/31/14 (previously reviewed by me): Pronounced curvature of the thoracic spine. Cardiomegaly noted. No nodule or opacity appreciated. No pleural effusion. Mediastinum otherwise normal in contour.  CT CHEST W/ 10/07/14 (previously reviewed by me): No pleural effusion or thickening. Borderline precarinal lymph node measuring 1 cm in short axis. Upper lobe predominant centrilobular emphysema with some cystic changes. 8 mm & 9 mm nodules within the left costophrenic sulcus with some spiculation. No other opacity appreciated.  CARDIAC TTE (10/12/13): LV normal in size with moderate LVH. EF 60-65%. Adequate for wall motion assessment. Grade 2 diastolic dysfunction. LA & RA severely dilated. RV poorly visualized but appears mild to moderately enlarged with "likely normal function". Pulmonary artery systolic pressure 41 mmHg. IVC dilated. No pericardial effusion. No aortic stenosis or regurgitation. Mild mitral regurgitation without stenosis. Mild pulmonic and tricuspid valve regurgitation without stenosis.  LABS 01/29/15 Alpha-1 Antitrypsin:  MM (170)  11/12/14 INR: 3.1  10/31/14 CBC: 9.6/1.0/33.6/311 BMP:  128/3.11/04/22/5/0.61/125/8.9    Assessment & Plan:  83 y.o. female with centrilobular emphysema, mediastinal adenopathy, pulmonary hypertension, chronic allergic rhinitis, GERD, & mediastinal adenopathy. The patient has no symptoms from her underlying emphysema which is likely due to her limited activity. Her pulmonary hypertension seems to be better controlled on daily Lasix therapy and her weight has remained stable. With regards to her mild postnasal drainage and intermittent cough these appear minimal and mostly in the morning upon awakening. Therefore, I do not feel further medication therapy is necessary at this time. Her reflux is symptomatically well controlled as long as she adheres to her diet. I instructed the patient to notify my office if she had any new breathing problems or questions before her next appointment as I would be happy to see her sooner.  1. Centrilobular Emphysema: Relatively asymptomatic. Continuing to hold on initiating inhaler medications. 2. Pulmonary Hypertension: No indication for pulmonary arterial vasodilator therapy at this time. Continuing on Lasix as managed by outside provider. 3. Chronic Allergic Rhinitis: Minimal symptoms. Continuing over-the-counter antihistamine therapy. 4. GERD: Well-controlled with Protonix and Zantac. No changes at this time. 5. Health Maintenance: S/P Pneumonia vaccine Fall 2016 & Influenza Vaccine October 2017. Administering Prevnar 13 vaccine today.  6. Follow-up: Return to clinic in 1 year or sooner if needed.  Sonia Baller Ashok Cordia, M.D. [facility] Pager:  [phone] After 3pm or if no response, call [phone] 12:57 PM 05/17/16

## 2016-05-18 DIAGNOSIS — M48061 Spinal stenosis, lumbar region without neurogenic claudication: Secondary | ICD-10-CM | POA: Diagnosis not present

## 2016-05-18 DIAGNOSIS — M5416 Radiculopathy, lumbar region: Secondary | ICD-10-CM | POA: Diagnosis not present

## 2016-05-18 DIAGNOSIS — I10 Essential (primary) hypertension: Secondary | ICD-10-CM | POA: Diagnosis not present

## 2016-05-18 DIAGNOSIS — Z6826 Body mass index (BMI) 26.0-26.9, adult: Secondary | ICD-10-CM | POA: Diagnosis not present

## 2016-05-30 ENCOUNTER — Ambulatory Visit (INDEPENDENT_AMBULATORY_CARE_PROVIDER_SITE_OTHER): Payer: Medicare Other | Admitting: *Deleted

## 2016-05-30 DIAGNOSIS — I214 Non-ST elevation (NSTEMI) myocardial infarction: Secondary | ICD-10-CM

## 2016-05-30 LAB — POCT INR: INR: 5.1

## 2016-06-06 ENCOUNTER — Ambulatory Visit (INDEPENDENT_AMBULATORY_CARE_PROVIDER_SITE_OTHER): Payer: Medicare Other | Admitting: *Deleted

## 2016-06-06 DIAGNOSIS — I214 Non-ST elevation (NSTEMI) myocardial infarction: Secondary | ICD-10-CM | POA: Diagnosis not present

## 2016-06-06 LAB — POCT INR: INR: 2.5

## 2016-06-07 ENCOUNTER — Encounter: Payer: Self-pay | Admitting: Gastroenterology

## 2016-06-07 ENCOUNTER — Ambulatory Visit (INDEPENDENT_AMBULATORY_CARE_PROVIDER_SITE_OTHER): Payer: Medicare Other | Admitting: Gastroenterology

## 2016-06-07 VITALS — BP 114/56 | HR 64 | Ht 61.0 in | Wt 138.0 lb

## 2016-06-07 DIAGNOSIS — F1190 Opioid use, unspecified, uncomplicated: Secondary | ICD-10-CM

## 2016-06-07 DIAGNOSIS — I214 Non-ST elevation (NSTEMI) myocardial infarction: Secondary | ICD-10-CM

## 2016-06-07 DIAGNOSIS — R1013 Epigastric pain: Secondary | ICD-10-CM

## 2016-06-07 DIAGNOSIS — R110 Nausea: Secondary | ICD-10-CM

## 2016-06-07 DIAGNOSIS — R932 Abnormal findings on diagnostic imaging of liver and biliary tract: Secondary | ICD-10-CM

## 2016-06-07 MED ORDER — ONDANSETRON 8 MG PO TBDP: 8.0000 mg | ORAL_TABLET | Freq: Three times a day (TID) | ORAL | 0 refills | Status: DC | PRN

## 2016-06-07 MED ORDER — PANTOPRAZOLE SODIUM 40 MG PO TBEC: 40.0000 mg | DELAYED_RELEASE_TABLET | Freq: Two times a day (BID) | ORAL | 1 refills | Status: DC

## 2016-06-07 MED ORDER — BUSPIRONE HCL 15 MG PO TABS: 15.0000 mg | ORAL_TABLET | Freq: Two times a day (BID) | ORAL | 1 refills | Status: DC

## 2016-06-07 NOTE — Patient Instructions (Addendum)
If you are age 81 or older, your body mass index should be between 23-30. Your Body mass index is 26.07 kg/m. If this is out of the aforementioned range listed, please consider follow up with your Primary Care Provider.  If you are age 28 or younger, your body mass index should be between 19-25. Your Body mass index is 26.07 kg/m. If this is out of the aformentioned range listed, please consider follow up with your Primary Care Provider.   We have sent the following medications to your pharmacy for you to pick up at your convenience:   Zofran   Dr. Havery Moros refilled Buspirone  Please increase your Protonix to twice daily for a few weeks  You have been scheduled for an ultrasound at [facility] (1st floor of hospital) on Wednesday March 7th at 10:45am . Please arrive 15 minutes prior to your appointment for registration. Make certain not to have anything to eat or drink 6 hours prior to your appointment. Should you need to reschedule your appointment, please contact radiology at [phone]. This test typically takes about 30 minutes to perform.  Follow up as needed with Dr. Havery Moros.

## 2016-06-07 NOTE — Progress Notes (Signed)
HPI :  83 year old female here for a follow-up visit for chronic nausea and dyspepsia. She reports having these symptoms "forever" and as long as she can remember. Her workup in recent years has included the following:  CT scan of the abdomen and pelvis in March 2016 she was noted to have atherosclerotic changes of the SMA and celiac but no progress since 2013 EGD 08/18/2014 - erythematous mucosa of the stomach, reactive changes with GIM, H pylori negative Prior barium study 06/15/15 showed some mild dysmotility.  CT head - medial left occipital lobe infarct with encephalomalacia. MRI head done in 2015.  Negative gastric emptying study in 4/16 [place] 08/2014 - s/p cholecystectomy, nodular contour of the liver concerning for possible cirrhosis  At the time of her last visit I had recommended an ultrasound elastography to further evaluate her liver but this was not done. I recommended a trial of FD Gard and minimizing narcotic use. She states FD Gard didn't help at all. She saw Alonza Bogus on January 14 and was placed on a trial of buspirone for dyspepsia.   She reports ongoing symptoms of nausea although she does think buspirone has helped with this and her dyspepsia since last visit. She has been taking zofran and phenergan for this and they both help. She also has some epigastric discomfort and worsening heartburn. She is taking protonix 40mg  once daily as well as zantac PRN. No dysphagia. Nausea waxes and wanes. She feels nausea on most days. No vomiting. She eats okay but has poor appetite. She eats small meals, she has early satiety. She denies problems with her bowels at present time.   She has been taking buspirone 15mg  bid since January. She does this this has helped. She thinks her nausea is not as bad in regards to severity, and frequency is less. She endorses being on Reglan in the past which has not helped.   She takes valium as needed and vicodin for chronic back pain, taking it every 6  hours, followed by pain management. She has been on gabapentin / neurontin in the past, she does not think that helped too much.   Last colonoscopy 2006 - diverticulosis, hemorrhoids, small 67mm polyp left side  She denies alcohol use.    Past Medical History:  Diagnosis Date  . Arthritis   . Atrial fibrillation ([place])   . Chronic back pain   . Diverticulosis   . Dyslipidemia    takes Lipitor daily  . Dysrhythmia    Atrial Fibrillation  . Foot drop    SINCE 1987  . GERD (gastroesophageal reflux disease)   . H/O hiatal hernia   . Hearing impaired    Bilateral hearing aids  . History of bacterial endocarditis   . History of gastritis   . History of stomach ulcers    many yrs ago  . History of TIA (transient ischemic attack)    2013-- RESIDUAL PERIPHERAL VISION RIGHT EYE--  RESOLVED  . Hypertension   . IC (interstitial cystitis)   . Pulmonary hypertension 10/2013   Based on TTE  . S/P aortic valve replacement    2005  . Urine incontinence      Past Surgical History:  Procedure Laterality Date  . ABDOMINAL HYSTERECTOMY  1967  . AORTIC VALVE REPLACEMENT  09-29-2003    [facility] pericardial tissue valve  . APPENDECTOMY  1933  . CARDIAC CATHETERIZATION  07-15-2003 DR HELEN PRESTON   MODERATE TO MODERATELY SEVERE CALCIFIC AORTIC STENOSIS/  NORMAL  CORONARY ARTERIES  . CARDIOVASCULAR STRESS TEST  01-30-2012  DR NASHER   NORMAL NUCLEAR STUDY/  EF 73%/  NORMAL LVF  . CARPAL TUNNEL RELEASE Bilateral   . CATARACT EXTRACTION W/ INTRAOCULAR LENS  IMPLANT, BILATERAL    . CHOLECYSTECTOMY  1993  . CYSTO WITH HYDRODISTENSION N/A 02/05/2013   Procedure: CYSTOSCOPY/HYDRODISTENSION;  Surgeon: Irine Seal, MD;  Location: [facility];  Service: Urology;  Laterality: N/A;  . CYSTO/ HYDRODISTENTION/ INSTILLATION CLORPACTIN  MULTIPLE  last one 2009  . DILATION AND CURETTAGE OF UTERUS    . ERCP N/A 08/10/2012   Procedure: ENDOSCOPIC RETROGRADE CHOLANGIOPANCREATOGRAPHY  (ERCP);  Surgeon: Inda Castle, MD;  Location: [place];  Service: Gastroenterology;  Laterality: N/A;  . ESOPHAGOGASTRODUODENOSCOPY (EGD) WITH PROPOFOL N/A 08/18/2014   Procedure: ESOPHAGOGASTRODUODENOSCOPY (EGD) WITH PROPOFOL;  Surgeon: Inda Castle, MD;  Location: [facility];  Service: Endoscopy;  Laterality: N/A;  . LEFT HEART CATHETERIZATION WITH CORONARY ANGIOGRAM N/A 10/18/2013   Procedure: LEFT HEART CATHETERIZATION WITH CORONARY ANGIOGRAM;  Surgeon: Jettie Booze, MD;  Location: [facility] CATH LAB;  Service: Cardiovascular;  Laterality: N/A;  . LUMBAR [place] &  2007  . MACROPLASTIQUE URETHRAL IMPLANTATION  08-27-2009  . MINI-OPEN RIGHT ROTATOR CUFF REPAIR  07-12-2001  . RIGHT SHOULDER ARTHROSCOPY W/ DEBRIDEMENT ROTATOR CUFF AND LABRAL TEAR/ ACROMINOPLASTY/ DISTAL CLAVICLE EXCISION/ CA LIGAMENT RELEASE  10-08-1999  . TONSILLECTOMY AND ADENOIDECTOMY    . TRANSTHORACIC ECHOCARDIOGRAM  08-10-2011   MILD LVH/  EF 55-60%/  NORMAL AVR TISSUE/ MILD MR/  MODERATE DILATED LA/  MILD DILATED RA  . TRANSVAGINAL TAPE PROCEDURE  07-30-2002  . VEIN LIGATION  1969   RIGHT LOWER LEG   Family History  Problem Relation Age of Onset  . Heart disease Father   . Stomach cancer Maternal Grandmother   . Esophageal cancer Maternal Grandfather   . Bipolar disorder Son     Committed Suicide  . Coronary artery disease Brother   . Bipolar disorder Brother     commited suiside at 26yo  . Alcohol abuse Sister     sister #1  . Alcohol abuse Sister     sister #2  . Colon cancer Maternal Aunt   . Lung disease Neg Hx    Social History  Substance Use Topics  . Smoking status: Former Smoker    Packs/day: 0.50    Years: 40.00    Types: Cigarettes    Start date: 05/06/1949    Quit date: 03/06/1990  . Smokeless tobacco: Never Used  . Alcohol use No     Comment: Remote social EtOH   Current Outpatient Prescriptions  Medication Sig Dispense Refill  . atorvastatin (LIPITOR) 20 MG tablet Take  20 mg by mouth daily.    . busPIRone (BUSPAR) 15 MG tablet Take 1 tablet (15 mg total) by mouth 2 (two) times daily. 60 tablet 1  . diazepam (VALIUM) 5 MG tablet Take 5 mg by mouth every 8 (eight) hours as needed for anxiety or muscle spasms.     Marland Kitchen diltiazem (CARTIA XT) 120 MG 24 hr capsule Take 120 mg by mouth 2 (two) times daily.    . furosemide (LASIX) 40 MG tablet Take 1 tablet (40 mg total) by mouth daily. 90 tablet 3  . HYDROcodone-acetaminophen (NORCO/VICODIN) 5-325 MG per tablet Take 1 tablet by mouth every 6 (six) hours as needed.    . hyoscyamine (LEVSIN, ANASPAZ) 0.125 MG tablet Take 0.125 mg by mouth every 4 (four) hours  as needed.    Marland Kitchen ipratropium (ATROVENT) 0.06 % nasal spray 1-2 sprays each nostril twice daily    . lisinopril (PRINIVIL,ZESTRIL) 40 MG tablet Take 20 mg by mouth daily.     Marland Kitchen LORazepam (ATIVAN) 0.5 MG tablet TAKE 1 TABLET BY MOUTH TWICE DAILY 60 tablet 2  . NON FORMULARY Prevision one tablet twice daily    . NON FORMULARY Colon Health one daily    . ondansetron (ZOFRAN) 4 MG tablet TAKE 1 TABLET BY MOUTH TWICE DAILY 60 tablet 1  . oxybutynin (DITROPAN) 5 MG tablet Take 5 mg by mouth 3 (three) times daily.     . pantoprazole (PROTONIX) 40 MG tablet TAKE ONE TABLET BY MOUTH DAILY 30 tablet 0  . promethazine (PHENERGAN) 12.5 MG tablet Take 1 tablet (12.5 mg total) by mouth every 6 (six) hours as needed for nausea or vomiting. 30 tablet 0  . ranitidine (ZANTAC) 150 MG tablet Take 150 mg by mouth at bedtime.    Marland Kitchen warfarin (COUMADIN) 5 MG tablet TAKE 1 TABLET BY MOUTH DAILY OR AS DIRECTED BY COUMADIN CLINIC. 30 tablet 3  . potassium chloride (K-DUR) 10 MEQ tablet Take 1 tablet (10 mEq total) by mouth once. (Patient taking differently: Take 10 mEq by mouth daily. ) 90 tablet 3   No current facility-administered medications for this visit.    Allergies  Allergen Reactions  . Amlodipine     Other reaction(s): edema  . Amoxicillin Diarrhea and Nausea And Vomiting  .  Carafate [Sucralfate] Swelling    Swelling in knees and blocky red marks on them.  . Cephalexin Diarrhea  . Codeine Nausea Only  . Irbesartan Swelling    Facial and hand numbness and swelling. Patient believes reaction was with this medication but isn't 100% sure. She is currently tolerating lisinopril.  Marland Kitchen Morphine And Related Other (See Comments)    HX ADDICTION / WITHDRAWAL  . Neurontin [Gabapentin] Swelling    Legs swell  . Nitrofurantoin Monohyd Macro Diarrhea and Nausea Only  . Sulfa Antibiotics Rash     Review of Systems: All systems reviewed and negative except where noted in HPI.   Lab Results  Component Value Date   WBC 10.0 08/19/2015   HGB 11.7 (L) 08/19/2015   HCT 34.6 (L) 08/19/2015   MCV 81.4 08/19/2015   PLT 221 08/19/2015    Lab Results  Component Value Date   CREATININE 0.98 (H) 02/01/2016   BUN 18 02/01/2016   NA 128 (L) 02/01/2016   K 5.2 02/01/2016   CL 91 (L) 02/01/2016   CO2 28 02/01/2016    Lab Results  Component Value Date   ALT 19 08/27/2014   AST 27 08/27/2014   ALKPHOS 70 08/27/2014   BILITOT 0.7 08/27/2014     Physical Exam: BP (!) 114/56   Pulse 64   Ht 5\' 1"  (1.549 m)   Wt 138 lb (62.6 kg)   BMI 26.07 kg/m  Constitutional: Pleasant, female in no acute distress, using walker for ambulation HEENT: Normocephalic and atraumatic. Conjunctivae are normal. No scleral icterus. Neck supple.  Cardiovascular: Normal rate, regular rhythm.  Pulmonary/chest: Effort normal, CTA B Abdominal: Soft, nondistended, nontender. . There are no masses palpable. No hepatomegaly. Extremities: no edema Lymphadenopathy: No cervical adenopathy noted. Neurological: Alert and oriented to person place and time. Skin: Skin is warm and dry. No rashes noted. Psychiatric: Normal mood and affect. Behavior is normal.   ASSESSMENT AND PLAN: 83 year old female here for follow-up for chronic  nausea and dyspepsia with an extensive evaluation as outlined above.     She is on multiple medications including chronic narcotics and benzodiazepines. I think it quite possible that her chronic narcotics are leading to chronic nausea and that this is related. I spoke with her about her chronic pain management regimen and recommend she follow up with her pain management physician to see if she can be on a non-narcotic regimen. Given her age and poor ambulation and need for a walker, I also think she is higher risk for falls, etc, on this regimen. She verbalized understanding of my recommendation but she is not sure how realistic it is for her to come off all narcotics.   In the interim I would continue buspirone, it is clearly provided some benefit in treating dyspepsia.  I'm hesitant to increase the dose further however given her ongoing narcotic use, given potential interactions. We can increase her Zofran to 8 mg every 8 hours as needed. Given her worsening reflux symptoms she can have a trial of Protonix 40 mg twice a day for a few weeks to see if this helps, and then the lowest dose needed to control symptoms. She can follow-up as needed for this issue, but I'm concerned she will continue feel nauseated as long she is on chronic narcotics.   Regarding her prior liver imaging, her spleen and platelets are normal, I think it may be less likely she has cirrhosis, and offered her a follow-up ultrasound elastography to ensure okay. She was agreeable with proceeding with this.  Hanna Cellar, MD [facility] Gastroenterology Pager [phone]

## 2016-06-15 ENCOUNTER — Ambulatory Visit (HOSPITAL_COMMUNITY)
Admission: RE | Admit: 2016-06-15 | Discharge: 2016-06-15 | Disposition: A | Discharge status: Home / Self Care | Payer: Medicare Other | Source: Ambulatory Visit | Attending: Gastroenterology | Admitting: Gastroenterology

## 2016-06-15 DIAGNOSIS — R110 Nausea: Secondary | ICD-10-CM | POA: Diagnosis not present

## 2016-06-15 DIAGNOSIS — I700 Atherosclerosis of aorta: Secondary | ICD-10-CM | POA: Insufficient documentation

## 2016-06-15 DIAGNOSIS — K30 Functional dyspepsia: Secondary | ICD-10-CM | POA: Diagnosis not present

## 2016-06-15 DIAGNOSIS — K769 Liver disease, unspecified: Secondary | ICD-10-CM | POA: Insufficient documentation

## 2016-06-15 DIAGNOSIS — R932 Abnormal findings on diagnostic imaging of liver and biliary tract: Secondary | ICD-10-CM | POA: Diagnosis not present

## 2016-06-15 DIAGNOSIS — R1013 Epigastric pain: Secondary | ICD-10-CM | POA: Insufficient documentation

## 2016-06-17 ENCOUNTER — Other Ambulatory Visit: Payer: Self-pay

## 2016-06-17 ENCOUNTER — Telehealth: Payer: Self-pay | Admitting: Gastroenterology

## 2016-06-17 DIAGNOSIS — R935 Abnormal findings on diagnostic imaging of other abdominal regions, including retroperitoneum: Secondary | ICD-10-CM

## 2016-06-17 NOTE — Telephone Encounter (Signed)
Mailed patient copy of ultrasound report and letter that has Dr. Doyne Keel findings and recommendations.

## 2016-06-17 NOTE — Progress Notes (Signed)
Letter with results and Dr. Doyne Keel recommendations.

## 2016-06-20 ENCOUNTER — Other Ambulatory Visit (INDEPENDENT_AMBULATORY_CARE_PROVIDER_SITE_OTHER): Payer: Medicare Other

## 2016-06-20 ENCOUNTER — Ambulatory Visit (INDEPENDENT_AMBULATORY_CARE_PROVIDER_SITE_OTHER): Payer: Medicare Other | Admitting: *Deleted

## 2016-06-20 DIAGNOSIS — I4891 Unspecified atrial fibrillation: Secondary | ICD-10-CM

## 2016-06-20 DIAGNOSIS — R935 Abnormal findings on diagnostic imaging of other abdominal regions, including retroperitoneum: Secondary | ICD-10-CM | POA: Diagnosis not present

## 2016-06-20 DIAGNOSIS — I214 Non-ST elevation (NSTEMI) myocardial infarction: Secondary | ICD-10-CM | POA: Diagnosis not present

## 2016-06-20 LAB — CBC WITH DIFFERENTIAL/PLATELET
BASOS PCT: 1.5 % (ref 0.0–3.0)
Basophils Absolute: 0.1 10*3/uL (ref 0.0–0.1)
EOS PCT: 2.6 % (ref 0.0–5.0)
Eosinophils Absolute: 0.2 10*3/uL (ref 0.0–0.7)
HCT: 37.6 % (ref 36.0–46.0)
Hemoglobin: 12.4 g/dL (ref 12.0–15.0)
LYMPHS ABS: 1.7 10*3/uL (ref 0.7–4.0)
Lymphocytes Relative: 19.7 % (ref 12.0–46.0)
MCHC: 33 g/dL (ref 30.0–36.0)
MCV: 86.4 fl (ref 78.0–100.0)
MONO ABS: 1 10*3/uL (ref 0.1–1.0)
Monocytes Relative: 11.1 % (ref 3.0–12.0)
NEUTROS ABS: 5.7 10*3/uL (ref 1.4–7.7)
NEUTROS PCT: 65.1 % (ref 43.0–77.0)
PLATELETS: 219 10*3/uL (ref 150.0–400.0)
RBC: 4.36 Mil/uL (ref 3.87–5.11)
RDW: 17.1 % — AB (ref 11.5–15.5)
WBC: 8.7 10*3/uL (ref 4.0–10.5)

## 2016-06-20 LAB — HEPATITIS C ANTIBODY: HCV AB: NEGATIVE

## 2016-06-20 LAB — HEPATIC FUNCTION PANEL
ALBUMIN: 4.2 g/dL (ref 3.5–5.2)
ALK PHOS: 94 U/L (ref 39–117)
ALT: 15 U/L (ref 0–35)
AST: 28 U/L (ref 0–37)
BILIRUBIN DIRECT: 0.1 mg/dL (ref 0.0–0.3)
BILIRUBIN TOTAL: 0.6 mg/dL (ref 0.2–1.2)
Total Protein: 8.2 g/dL (ref 6.0–8.3)

## 2016-06-20 LAB — HEPATITIS B SURFACE ANTIGEN: Hepatitis B Surface Ag: NEGATIVE

## 2016-06-20 LAB — POCT INR: INR: 2.4

## 2016-06-22 DIAGNOSIS — H353112 Nonexudative age-related macular degeneration, right eye, intermediate dry stage: Secondary | ICD-10-CM | POA: Diagnosis not present

## 2016-06-22 DIAGNOSIS — H35033 Hypertensive retinopathy, bilateral: Secondary | ICD-10-CM | POA: Diagnosis not present

## 2016-06-22 DIAGNOSIS — H353124 Nonexudative age-related macular degeneration, left eye, advanced atrophic with subfoveal involvement: Secondary | ICD-10-CM | POA: Diagnosis not present

## 2016-06-22 DIAGNOSIS — H40013 Open angle with borderline findings, low risk, bilateral: Secondary | ICD-10-CM | POA: Diagnosis not present

## 2016-06-23 ENCOUNTER — Telehealth: Payer: Self-pay

## 2016-06-23 NOTE — Telephone Encounter (Signed)
Pt informed of results. She will follow up 08/17/16 at 1:30 with [facility].

## 2016-06-23 NOTE — Telephone Encounter (Signed)
-----   Message from Manus Gunning, MD sent at 06/21/2016  8:46 AM EDT ----- Caryl Pina can you please relay the following to this patient: - her blood work is normal, normal LFTs and CBC. She tested negative for viral hepatitis - while it remains possible she has cirrhosis based on imaging, I am not convinced, think it may be less likely - she's had negative viral testing, negative iron studies, and negative alpha one antitrypsin historically. Her lfts are normal - I would like her to have a follow up with me in a couple months in the office to discuss this further and make sure she understands everything. Can you please help coordinate? Thanks

## 2016-06-23 NOTE — Telephone Encounter (Signed)
Tried calling pt. The phone did ring but there was no answer or vmail. Phone just cut off. Will try back at a later time.

## 2016-07-11 ENCOUNTER — Other Ambulatory Visit: Payer: Self-pay | Admitting: Gastroenterology

## 2016-07-14 ENCOUNTER — Other Ambulatory Visit: Payer: Self-pay | Admitting: Cardiovascular Disease

## 2016-07-18 ENCOUNTER — Ambulatory Visit (INDEPENDENT_AMBULATORY_CARE_PROVIDER_SITE_OTHER): Payer: Medicare Other | Admitting: *Deleted

## 2016-07-18 DIAGNOSIS — I214 Non-ST elevation (NSTEMI) myocardial infarction: Secondary | ICD-10-CM | POA: Diagnosis not present

## 2016-07-18 DIAGNOSIS — I4891 Unspecified atrial fibrillation: Secondary | ICD-10-CM

## 2016-07-18 LAB — POCT INR: INR: 3.9

## 2016-08-02 ENCOUNTER — Ambulatory Visit (INDEPENDENT_AMBULATORY_CARE_PROVIDER_SITE_OTHER): Payer: Medicare Other | Admitting: *Deleted

## 2016-08-02 DIAGNOSIS — I4891 Unspecified atrial fibrillation: Secondary | ICD-10-CM

## 2016-08-02 DIAGNOSIS — I214 Non-ST elevation (NSTEMI) myocardial infarction: Secondary | ICD-10-CM | POA: Diagnosis not present

## 2016-08-02 LAB — POCT INR: INR: 1.8

## 2016-08-05 ENCOUNTER — Other Ambulatory Visit: Payer: Self-pay | Admitting: Gastroenterology

## 2016-08-08 NOTE — Telephone Encounter (Signed)
OK to refill for 1 month only.

## 2016-08-08 NOTE — Telephone Encounter (Signed)
Dr. Fuller Plan, Will you refill this for Dr. Havery Moros? Pt takes for Dyspepsia and her last OV was in Feb of this year. Thank you.

## 2016-08-09 ENCOUNTER — Ambulatory Visit (INDEPENDENT_AMBULATORY_CARE_PROVIDER_SITE_OTHER): Payer: Medicare Other | Admitting: Cardiovascular Disease

## 2016-08-09 ENCOUNTER — Ambulatory Visit (INDEPENDENT_AMBULATORY_CARE_PROVIDER_SITE_OTHER): Payer: Medicare Other | Admitting: *Deleted

## 2016-08-09 ENCOUNTER — Encounter: Payer: Self-pay | Admitting: Cardiovascular Disease

## 2016-08-09 VITALS — BP 100/52 | HR 63 | Ht 61.0 in | Wt 135.8 lb

## 2016-08-09 DIAGNOSIS — I482 Chronic atrial fibrillation, unspecified: Secondary | ICD-10-CM

## 2016-08-09 DIAGNOSIS — I5032 Chronic diastolic (congestive) heart failure: Secondary | ICD-10-CM | POA: Diagnosis not present

## 2016-08-09 DIAGNOSIS — I214 Non-ST elevation (NSTEMI) myocardial infarction: Secondary | ICD-10-CM

## 2016-08-09 DIAGNOSIS — I4891 Unspecified atrial fibrillation: Secondary | ICD-10-CM

## 2016-08-09 LAB — POCT INR: INR: 2.8

## 2016-08-09 NOTE — Patient Instructions (Signed)

## 2016-08-09 NOTE — Progress Notes (Signed)
Cardiology Office Note   ID:  Brittany [name], Brittany [name] Oct 31, 1927, MRN [id number]  PCP:  Mayra Neer, MD  Cardiologist:   Mertie Moores, MD   Chief Complaint  Patient presents with  . Follow-up    CAD, chronic diastolic CHF   1. Aortic valve replacement 2. Dyslipidemia 3. Hypertension 4. 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  5. Atrial fibrillation   Brittany [name] is an 83 yo with aortic valve replacement, HTN, and hyperlipidemia. She has not had any cardiac complaints recently. She complains of easy bruising on her arms.   She had an episode of severe heart burn this past week.   She's had a stroke over the past year. This has left her with some balance issues and some loss of peripheral vision.  Nov. 4, 2014:  Brittany [name] is doing well from a cardiac standpoint. She is having problems with interstitial cystitis. She had a procedure last week and had a small bladder tear which they are letting heal. She is still having some significant pain.   Aug 27, 2013:  Brittany [name] is doing OK. She is not having any problems . Still very active.  She had an AVR   She is having problems with interstitial cyctitis.   November 06, 2013:  Brittany [name] was admitted to the hospital twice on July , 2015: DC summary :  83 year old Caucasian female with past medical history significant for hypertension, hyperlipidemia, history of CVA permanent atrial fibrillation on Xarelto for the last 2 years, and history of aortic valve replacement with bioprosthetic valve in 2005, here with chest pain. She was recently admitted to the hospital for management of malignant HTN , diastolic HF and headache. She was seen by Dr Haroldine Laws and [facility] [place] on 10/16/2013. She returns within 24 hrs for symptoms of chest pain . Pt states that earlier in the afternoon while she was trying to sleep post lunch she started experiencing soreness , heaviness "someone sitting on my chest" on the left side radiating to left  arm with associated mild SOB. This improved with SL NTG , however, she continues to have some chest pain. Pt denied any orthopnea, PND , LE edema , syncope, claudication , palpitation etc . She reports medication compliance. She bruises easily in her skin and once episodes of epistaxis several months ago.  She was admitted. Xarelto was held in anticipation of coronary angiography. ASA, statin, BB, IV NTG and heparin added. She ruled in for NSTEMI with troponin of 0.63. Left heart cath revealed severe, fibrotic disease in the ostial right coronary artery which was the culprit lesion. This was successfully stented with a 2.5 x 14 resolute drug-eluting stent, postdilated to 3.3 mm in diameter. She was started on plavix, however, a P2Y12 came back at 289 so she was started on Brilinta. Cardizem cd 120 was added for afib RVR with improvement. Due to mild cough Lisinopril was changed to irbesartan. Xarelto was changed to coumadin and she will follow up two days after DC in the coumadin clinic   She now presents for follow up . She has some MSK pain ( hurts when she presses her chest. She has had some bleeding. She woke up in the morning with a small skin tear on her right ankle . There was lots of blood in the bed. The bleeding eventually stopped with pressure. She also has had a nose bleed.   She has not had any chest pain and her energy levels have  been better. INR levels have been OK.   Oct. 29, 2015:  Brittany.  [name] feeling better. She had a bad viral illness last week and thinks that she might have the flu. She is getting better.  She's had some problems with anemia and she is turned in 3 sets of guaiac cards. She has had some dyspnea and was started on Lasix on a QOD schedule.  She is now off Brilinta. She had a major bleeding from he right foot. She was on brilinta because She had an inadequate response to plavix originally but later we discovered that she was also on Omeprazole. We  have stopped the omeprazone and her PRU is better. She is taking Nexium instead.   Off Eliquis. Is back on warfarin. doing well     June 20, 2014:  Brittany [name] is a 83 y.o. female who presents for follow up .  She has had prolonged nausea and abdominal pain.  Worse with eating. No cardiac symptoms .   Sept. 8, 2016:    Doing well from a cardiac issue.  has been diagnosed with COPD ( was a former smoker, 25 years ago )  Still has some stomach issues.   Is still weak but is gradually improving  Having intermittant nose bleeds - is on Plavix and couamdin  June 18, 2015:  Doing ok from a cardiac standpoint Walks with the walker - has some leg edema .  Can use a cane when she is in her house Avoids eating salt-does not eat out much .     June 26, [address]017:  Brittany [name] has been complaining of increasing shortness of breath / cough over the past several weeks.    We have backed off her diuretic because of the concern for renal insufficiency.   Her previous lasix dose was 20 mg on Mondays and Thursday.     Last week she called Sharyn Lull and we increased her Lasix.  40 mg a day for 2-3 days. She seems to be feeling quite a bit better.  She's lost 7 pounds. She is back close to normal .    Cough is better,   Still short of breath   Oct. 4, 2017:  Brittany [name] is seen back today  Still has shortness of breath, especially after she is rushing around .  Has had low salt level and her lasix was decreased and she has been limiting her free water intake.   Aug 09, 2016:  Brittany [name] is seen back for follow up for her CAD, HTN, atrial fib  and chronic diastolic CHF  No active cardiac issues    Past Medical History:  Diagnosis Date  . Arthritis   . Atrial fibrillation ([place])   . Chronic back pain   . Diverticulosis   . Dyslipidemia    takes Lipitor daily  . Dysrhythmia    Atrial Fibrillation  . Foot drop    SINCE 1987  . GERD (gastroesophageal reflux disease)   . H/O hiatal  hernia   . Hearing impaired    Bilateral hearing aids  . History of bacterial endocarditis   . History of gastritis   . History of stomach ulcers    many yrs ago  . History of TIA (transient ischemic attack)    2013-- RESIDUAL PERIPHERAL VISION RIGHT EYE--  RESOLVED  . Hypertension   . IC (interstitial cystitis)   . Pulmonary hypertension ([place]) 10/2013   Based on TTE  . S/P aortic valve  replacement    2005  . Urine incontinence     Past Surgical History:  Procedure Laterality Date  . ABDOMINAL HYSTERECTOMY  1967  . AORTIC VALVE REPLACEMENT  09-29-2003    [facility] pericardial tissue valve  . APPENDECTOMY  1933  . CARDIAC CATHETERIZATION  07-15-2003 DR HELEN PRESTON   MODERATE TO MODERATELY SEVERE CALCIFIC AORTIC STENOSIS/  NORMAL CORONARY ARTERIES  . CARDIOVASCULAR STRESS TEST  01-30-2012  DR NASHER   NORMAL NUCLEAR STUDY/  EF 73%/  NORMAL LVF  . CARPAL TUNNEL RELEASE Bilateral   . CATARACT EXTRACTION W/ INTRAOCULAR LENS  IMPLANT, BILATERAL    . CHOLECYSTECTOMY  1993  . CYSTO WITH HYDRODISTENSION N/A 02/05/2013   Procedure: CYSTOSCOPY/HYDRODISTENSION;  Surgeon: Irine Seal, MD;  Location: [facility];  Service: Urology;  Laterality: N/A;  . CYSTO/ HYDRODISTENTION/ INSTILLATION CLORPACTIN  MULTIPLE  last one 2009  . DILATION AND CURETTAGE OF UTERUS    . ERCP N/A 08/10/2012   Procedure: ENDOSCOPIC RETROGRADE CHOLANGIOPANCREATOGRAPHY (ERCP);  Surgeon: Inda Castle, MD;  Location: [facility];  Service: Gastroenterology;  Laterality: N/A;  . ESOPHAGOGASTRODUODENOSCOPY (EGD) WITH PROPOFOL N/A 08/18/2014   Procedure: ESOPHAGOGASTRODUODENOSCOPY (EGD) WITH PROPOFOL;  Surgeon: Inda Castle, MD;  Location: [facility] END[facility];  Service: Endoscopy;  Laterality: N/A;  . LEFT HEART CATHETERIZATION WITH CORONARY ANGIOGRAM N/A 10/18/2013   Procedure: LEFT HEART CATHETERIZATION WITH CORONARY ANGIOGRAM;  Surgeon: Jettie Booze, MD;  Location: [facility] CATH LAB;  Service:  Cardiovascular;  Laterality: N/A;  . LUMBAR [place] &  2007  . MACROPLASTIQUE URETHRAL IMPLANTATION  08-27-2009  . MINI-OPEN RIGHT ROTATOR CUFF REPAIR  07-12-2001  . RIGHT SHOULDER ARTHROSCOPY W/ DEBRIDEMENT ROTATOR CUFF AND LABRAL TEAR/ ACROMINOPLASTY/ DISTAL CLAVICLE EXCISION/ CA LIGAMENT RELEASE  10-08-1999  . TONSILLECTOMY AND ADENOIDECTOMY    . TRANSTHORACIC ECHOCARDIOGRAM  08-10-2011   MILD LVH/  EF 55-60%/  NORMAL AVR TISSUE/ MILD MR/  MODERATE DILATED LA/  MILD DILATED RA  . TRANSVAGINAL TAPE PROCEDURE  07-30-2002  . VEIN LIGATION  1969   RIGHT LOWER LEG     Current Outpatient Prescriptions  Medication Sig Dispense Refill  . atorvastatin (LIPITOR) 20 MG tablet Take 20 mg by mouth daily.    . busPIRone (BUSPAR) 15 MG tablet TAKE ONE TABLET BY MOUTH TWICE A DAY 60 tablet 0  . diazepam (VALIUM) 5 MG tablet Take 5 mg by mouth every 8 (eight) hours as needed for anxiety or muscle spasms.     Marland Kitchen diltiazem (CARTIA XT) 120 MG 24 hr capsule Take 120 mg by mouth 2 (two) times daily.    . furosemide (LASIX) 40 MG tablet Take 1 tablet (40 mg total) by mouth daily. 90 tablet 3  . HYDROcodone-acetaminophen (NORCO/VICODIN) 5-325 MG per tablet Take 1 tablet by mouth every 6 (six) hours as needed.    . hyoscyamine (LEVSIN, ANASPAZ) 0.125 MG tablet Take 0.125 mg by mouth every 4 (four) hours as needed.    Marland Kitchen ipratropium (ATROVENT) 0.06 % nasal spray 1-2 sprays each nostril twice daily    . lisinopril (PRINIVIL,ZESTRIL) 40 MG tablet Take 20 mg by mouth daily.     Marland Kitchen LORazepam (ATIVAN) 0.5 MG tablet TAKE 1 TABLET BY MOUTH TWICE DAILY 60 tablet 2  . NON FORMULARY Prevision one tablet twice daily    . NON FORMULARY Colon Health one daily    . ondansetron (ZOFRAN) 4 MG tablet TAKE 1 TABLET BY MOUTH TWICE DAILY 60 tablet 1  .  ondansetron (ZOFRAN-ODT) 8 MG disintegrating tablet DISSOLVE ONE TABLET BY MOUTH EVERY 8 HOURS AS NEEDED FOR NAUSEA OR VOMITING 20 tablet 0  . oxybutynin (DITROPAN) 5 MG  tablet Take 5 mg by mouth 3 (three) times daily.     . pantoprazole (PROTONIX) 40 MG tablet TAKE ONE TABLET BY MOUTH TWICE A DAY 60 tablet 0  . promethazine (PHENERGAN) 12.5 MG tablet Take 1 tablet (12.5 mg total) by mouth every 6 (six) hours as needed for nausea or vomiting. 30 tablet 0  . ranitidine (ZANTAC) 150 MG tablet Take 150 mg by mouth at bedtime.    Marland Kitchen warfarin (COUMADIN) 5 MG tablet TAKE ONE TABLET BY MOUTH DAILY OR AS DIRECTED BY COUMADIN CLINIC 30 tablet 3  . potassium chloride (K-DUR) 10 MEQ tablet Take 1 tablet (10 mEq total) by mouth once. (Patient taking differently: Take 10 mEq by mouth daily. ) 90 tablet 3   No current facility-administered medications for this visit.     Allergies:   Amlodipine; Amoxicillin; Carafate [sucralfate]; Cephalexin; Codeine; Irbesartan; Morphine and related; Neurontin [gabapentin]; Nitrofurantoin monohyd macro; and Sulfa antibiotics    Social History:  The patient  reports that she quit smoking about 26 years ago. Her smoking use included Cigarettes. She started smoking about 67 years ago. She has a 20.00 pack-year smoking history. She has never used smokeless tobacco. She reports that she does not drink alcohol or use drugs.   Family History:  The patient's family history includes Alcohol abuse in her sister and sister; Bipolar disorder in her brother and son; Colon cancer in her maternal aunt; Coronary artery disease in her brother; Esophageal cancer in her maternal grandfather; Heart disease in her father; Stomach cancer in her maternal grandmother.    ROS:  Please see the history of present illness.    Review of Systems: Constitutional:  admits to  , appetite change    HEENT: denies photophobia, eye pain, redness, hearing loss, ear pain, congestion, sore throat, rhinorrhea, sneezing, neck pain, neck stiffness and tinnitus.  Respiratory: admits to SOB, DOE with activity , cough, chest tightness, and wheezing.  Cardiovascular: denies chest  pain, palpitations and leg swelling.  Gastrointestinal: admits to nausea, vomiting, abdominal pain,   Genitourinary: denies dysuria, urgency, frequency, hematuria, flank pain and difficulty urinating.  Musculoskeletal: admits to   back pain,     Skin: denies pallor, rash and wound.  Neurological: denies dizziness, seizures, syncope, weakness, light-headedness, numbness and headaches.   Hematological: admits to   easy bruising,   Psychiatric/ Behavioral: denies suicidal ideation, mood changes, confusion, nervousness, sleep disturbance and agitation.       All other systems are reviewed and negative.    PHYSICAL EXAM: VS:  BP (!) 100/52 (BP Location: Left Arm, Patient Position: Sitting, Cuff Size: Normal)   Pulse 63   Ht 5\' 1"  (1.549 m)   Wt 135 lb 12.8 oz (61.6 kg)   SpO2 96%   BMI 25.66 kg/m  , BMI Body mass index is 25.66 kg/m. GEN: Well nourished, well developed, in no acute distress  HEENT: normal  Neck: no JVD, carotid bruits, or masses Cardiac: Irreg.. Irreg. ; no murmurs, rubs, or gallops,no edema  Respiratory:  clear to auscultation bilaterally, normal work of breathing GI: soft, nontender, nondistended, + BS Brittany: no deformity or atrophy  Skin: warm and dry, no rash Neuro:  Strength and sensation are intact, walks with the assistance of a walker  Psych: normal   EKG:  EKG is ordered  today. Atrial fib with V rate of 63.   Recent Labs: 02/01/2016: BUN 18; Creat 0.98; Potassium 5.2; Sodium 128 06/20/2016: ALT 15; Hemoglobin 12.4; Platelets 219.0    Lipid Panel    Component Value Date/Time   CHOL 159 10/12/2013 0525   TRIG 53 10/12/2013 0525   HDL 62 10/12/2013 0525   CHOLHDL 2.6 10/12/2013 0525   VLDL 11 10/12/2013 0525   LDLCALC 86 10/12/2013 0525      Wt Readings from Last 3 Encounters:  08/09/16 135 lb 12.8 oz (61.6 kg)  06/07/16 138 lb (62.6 kg)  05/17/16 138 lb (62.6 kg)      Other studies Reviewed: Additional studies/ records that were reviewed  today include: records from medical doctor. Review of the above records demonstrates: several months of abdominal pain    ASSESSMENT AND PLAN:  1. Aortic valve replacement - Brittany [name] is doing well. We'll continue the current medications. She's on Coumadin for her atrial fibrillation.  2. Dyslipidemia- continue current dose of atorvastatin.  3. Hypertension- blood pressure is well-controlled.  Continue current meds.   4. 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   5. Atrial fibrillation- very stable. Continue Coumadin. Her rate is  Well controlled   6. COPD:  Has a chronic cough .    7. CAD:   had a stent placed in July, 2015.   We have stopped the Plavix due to nose bleeds.  She'll continue with Coumadin I'll see her again in 6 month.  8. Chronic diastolic congestive heart failure.  She seems to be doing lots better on the increased dose of Lasix.  Current medicines are reviewed at length with the patient today.  The patient does not have concerns regarding medicines.  The following changes have been made:  no change  Disposition:   FU with me in 6 months     Signed, Mertie Moores, MD  08/09/2016 1:40 PM    [facility] [address], [place], [place]  [address] Phone: [phone]; Fax: [phone]

## 2016-08-10 DIAGNOSIS — M5416 Radiculopathy, lumbar region: Secondary | ICD-10-CM | POA: Diagnosis not present

## 2016-08-10 DIAGNOSIS — I10 Essential (primary) hypertension: Secondary | ICD-10-CM | POA: Diagnosis not present

## 2016-08-10 DIAGNOSIS — Z6825 Body mass index (BMI) 25.0-25.9, adult: Secondary | ICD-10-CM | POA: Diagnosis not present

## 2016-08-10 DIAGNOSIS — M48061 Spinal stenosis, lumbar region without neurogenic claudication: Secondary | ICD-10-CM | POA: Diagnosis not present

## 2016-08-11 ENCOUNTER — Other Ambulatory Visit: Payer: Self-pay | Admitting: Gastroenterology

## 2016-08-11 DIAGNOSIS — L820 Inflamed seborrheic keratosis: Secondary | ICD-10-CM | POA: Diagnosis not present

## 2016-08-11 DIAGNOSIS — L309 Dermatitis, unspecified: Secondary | ICD-10-CM | POA: Diagnosis not present

## 2016-08-17 ENCOUNTER — Ambulatory Visit (INDEPENDENT_AMBULATORY_CARE_PROVIDER_SITE_OTHER): Payer: Medicare Other | Admitting: Gastroenterology

## 2016-08-17 VITALS — BP 104/50 | Ht 61.0 in | Wt 136.5 lb

## 2016-08-17 DIAGNOSIS — I214 Non-ST elevation (NSTEMI) myocardial infarction: Secondary | ICD-10-CM

## 2016-08-17 DIAGNOSIS — R1013 Epigastric pain: Secondary | ICD-10-CM | POA: Diagnosis not present

## 2016-08-17 DIAGNOSIS — R932 Abnormal findings on diagnostic imaging of liver and biliary tract: Secondary | ICD-10-CM

## 2016-08-17 NOTE — Patient Instructions (Signed)
If you are age 81 or older, your body mass index should be between 23-30. Your Body mass index is 25.79 kg/m. If this is out of the aforementioned range listed, please consider follow up with your Primary Care Provider.  If you are age 14 or younger, your body mass index should be between 19-25. Your Body mass index is 25.79 kg/m. If this is out of the aformentioned range listed, please consider follow up with your Primary Care Provider.   Please follow up with Dr. Havery Moros in 6 months.  Thank you.

## 2016-08-17 NOTE — Progress Notes (Signed)
HPI :  83 year old female well known to me, here for follow-up visit to discuss the following issues.  Chronic dyspepsia / chronic narcotic use - please see my last clinic note for details of this issue. Since last visit increased zofran to every 8 hours PRN. Increased protonix to 40mg  BID. She has continued buspirone 15 mg twice a day. She reports her nausea has been better controlled since [place] last seen her. Sometime certain foods can bother her. Using zofran 3 times per week, has not needed it much. She continues buspirone 15mg  BID, she thinks it has worked very well for her. Higher dose zofran has worked as well.   She had [place] with elastography to further evaluate for possible cirrhosis since the last visit, done on 06/15/16. The [place] was suggestive of cirrhosis of the liver but fibrosis score mostly F2, some F3. Spleen and platelets were normal. If she has cirrhosis it seems well compensated. Repeat blood work showed normal LFTs, normal CBC, negative testing for hep B/C. Negative iron studies and alpha one antitrypsin historically. She denies any significant alcohol use in the past. Sister had alcoholic cirrhosis.  She has a history of chronic narcotic use and chronic Valium use for chronic pain, followed by pain management service.   EGD 08/18/2014 - no varices. Last colonoscopy 2006 - diverticulosis, hemorrhoids, small 32mm polyp left side    Past Medical History:  Diagnosis Date  . Arthritis   . Atrial fibrillation ([place])   . Chronic back pain   . Diverticulosis   . Dyslipidemia    takes Lipitor daily  . Dysrhythmia    Atrial Fibrillation  . Foot drop    SINCE 1987  . GERD (gastroesophageal reflux disease)   . H/O hiatal hernia   . Hearing impaired    Bilateral hearing aids  . History of bacterial endocarditis   . History of gastritis   . History of stomach ulcers    many yrs ago  . History of TIA (transient ischemic attack)    2013-- RESIDUAL PERIPHERAL VISION RIGHT EYE--   RESOLVED  . Hypertension   . IC (interstitial cystitis)   . Pulmonary hypertension ([place]) 10/2013   Based on TTE  . S/P aortic valve replacement    2005  . Urine incontinence      Past Surgical History:  Procedure Laterality Date  . ABDOMINAL HYSTERECTOMY  1967  . AORTIC VALVE REPLACEMENT  09-29-2003    [facility] pericardial tissue valve  . APPENDECTOMY  1933  . CARDIAC CATHETERIZATION  07-15-2003 DR HELEN PRESTON   MODERATE TO MODERATELY SEVERE CALCIFIC AORTIC STENOSIS/  NORMAL CORONARY ARTERIES  . CARDIOVASCULAR STRESS TEST  01-30-2012  DR NASHER   NORMAL NUCLEAR STUDY/  EF 73%/  NORMAL LVF  . CARPAL TUNNEL RELEASE Bilateral   . CATARACT EXTRACTION W/ INTRAOCULAR LENS  IMPLANT, BILATERAL    . CHOLECYSTECTOMY  1993  . CYSTO WITH HYDRODISTENSION N/A 02/05/2013   Procedure: CYSTOSCOPY/HYDRODISTENSION;  Surgeon: Irine Seal, MD;  Location: [facility];  Service: Urology;  Laterality: N/A;  . CYSTO/ HYDRODISTENTION/ INSTILLATION CLORPACTIN  MULTIPLE  last one 2009  . DILATION AND CURETTAGE OF UTERUS    . ERCP N/A 08/10/2012   Procedure: ENDOSCOPIC RETROGRADE CHOLANGIOPANCREATOGRAPHY (ERCP);  Surgeon: Inda Castle, MD;  Location: [facility];  Service: Gastroenterology;  Laterality: N/A;  . ESOPHAGOGASTRODUODENOSCOPY (EGD) WITH PROPOFOL N/A 08/18/2014   Procedure: ESOPHAGOGASTRODUODENOSCOPY (EGD) WITH PROPOFOL;  Surgeon: Inda Castle, MD;  Location: [facility]  END[facility];  Service: Endoscopy;  Laterality: N/A;  . LEFT HEART CATHETERIZATION WITH CORONARY ANGIOGRAM N/A 10/18/2013   Procedure: LEFT HEART CATHETERIZATION WITH CORONARY ANGIOGRAM;  Surgeon: Jettie Booze, MD;  Location: [facility] CATH LAB;  Service: Cardiovascular;  Laterality: N/A;  . LUMBAR [place] &  2007  . MACROPLASTIQUE URETHRAL IMPLANTATION  08-27-2009  . MINI-OPEN RIGHT ROTATOR CUFF REPAIR  07-12-2001  . RIGHT SHOULDER ARTHROSCOPY W/ DEBRIDEMENT ROTATOR CUFF AND LABRAL TEAR/ ACROMINOPLASTY/  DISTAL CLAVICLE EXCISION/ CA LIGAMENT RELEASE  10-08-1999  . TONSILLECTOMY AND ADENOIDECTOMY    . TRANSTHORACIC ECHOCARDIOGRAM  08-10-2011   MILD LVH/  EF 55-60%/  NORMAL AVR TISSUE/ MILD MR/  MODERATE DILATED LA/  MILD DILATED RA  . TRANSVAGINAL TAPE PROCEDURE  07-30-2002  . VEIN LIGATION  1969   RIGHT LOWER LEG   Family History  Problem Relation Age of Onset  . Heart disease Father   . Stomach cancer Maternal Grandmother   . Esophageal cancer Maternal Grandfather   . Bipolar disorder Son     Committed Suicide  . Coronary artery disease Brother   . Bipolar disorder Brother     commited suiside at 66yo  . Alcohol abuse Sister     sister #1  . Alcohol abuse Sister     sister #2  . Colon cancer Maternal Aunt   . Lung disease Neg Hx    Social History  Substance Use Topics  . Smoking status: Former Smoker    Packs/day: 0.50    Years: 40.00    Types: Cigarettes    Start date: 05/06/1949    Quit date: 03/06/1990  . Smokeless tobacco: Never Used  . Alcohol use No     Comment: Remote social EtOH   Current Outpatient Prescriptions  Medication Sig Dispense Refill  . atorvastatin (LIPITOR) 20 MG tablet Take 20 mg by mouth daily.    . busPIRone (BUSPAR) 15 MG tablet TAKE ONE TABLET BY MOUTH TWICE A DAY 60 tablet 0  . diazepam (VALIUM) 5 MG tablet Take 5 mg by mouth every 8 (eight) hours as needed for anxiety or muscle spasms.     Marland Kitchen diltiazem (CARTIA XT) 120 MG 24 hr capsule Take 120 mg by mouth 2 (two) times daily.    . furosemide (LASIX) 40 MG tablet Take 1 tablet (40 mg total) by mouth daily. 90 tablet 3  . HYDROcodone-acetaminophen (NORCO/VICODIN) 5-325 MG per tablet Take 1 tablet by mouth every 6 (six) hours as needed.    . hyoscyamine (LEVSIN, ANASPAZ) 0.125 MG tablet Take 0.125 mg by mouth every 4 (four) hours as needed.    Marland Kitchen ipratropium (ATROVENT) 0.06 % nasal spray 1-2 sprays each nostril twice daily    . lisinopril (PRINIVIL,ZESTRIL) 40 MG tablet Take 20 mg by mouth daily.      Marland Kitchen LORazepam (ATIVAN) 0.5 MG tablet TAKE 1 TABLET BY MOUTH TWICE DAILY 60 tablet 2  . NON FORMULARY Prevision one tablet twice daily    . NON FORMULARY Colon Health one daily    . ondansetron (ZOFRAN) 4 MG tablet TAKE 1 TABLET BY MOUTH TWICE DAILY 60 tablet 1  . ondansetron (ZOFRAN-ODT) 8 MG disintegrating tablet DISSOLVE ONE TABLET BY MOUTH EVERY 8 HOURS AS NEEDED FOR NAUSEA OR VOMITING 20 tablet 0  . oxybutynin (DITROPAN) 5 MG tablet Take 5 mg by mouth 3 (three) times daily.     . pantoprazole (PROTONIX) 40 MG tablet TAKE ONE TABLET BY MOUTH TWICE A DAY 60 tablet  0  . promethazine (PHENERGAN) 12.5 MG tablet Take 1 tablet (12.5 mg total) by mouth every 6 (six) hours as needed for nausea or vomiting. 30 tablet 0  . ranitidine (ZANTAC) 150 MG tablet Take 150 mg by mouth at bedtime.    Marland Kitchen warfarin (COUMADIN) 5 MG tablet TAKE ONE TABLET BY MOUTH DAILY OR AS DIRECTED BY COUMADIN CLINIC 30 tablet 3  . potassium chloride (K-DUR) 10 MEQ tablet Take 1 tablet (10 mEq total) by mouth once. (Patient taking differently: Take 10 mEq by mouth daily. ) 90 tablet 3   No current facility-administered medications for this visit.    Allergies  Allergen Reactions  . Amlodipine     Other reaction(s): edema  . Amoxicillin Diarrhea and Nausea And Vomiting  . Carafate [Sucralfate] Swelling    Swelling in knees and blocky red marks on them.  . Cephalexin Diarrhea  . Codeine Nausea Only  . Irbesartan Swelling    Facial and hand numbness and swelling. Patient believes reaction was with this medication but isn't 100% sure. She is currently tolerating lisinopril.  Marland Kitchen Morphine And Related Other (See Comments)    HX ADDICTION / WITHDRAWAL  . Neurontin [Gabapentin] Swelling    Legs swell  . Nitrofurantoin Monohyd Macro Diarrhea and Nausea Only  . Sulfa Antibiotics Rash     Review of Systems: All systems reviewed and negative except where noted in HPI.    No results found.  Physical Exam: BP (!) 104/50    Ht 5\' 1"  (1.549 m)   Wt 136 lb 8 oz (61.9 kg)   BMI 25.79 kg/m  Constitutional: Pleasant,well-developed, female in no acute distress. HEENT: Normocephalic and atraumatic. Conjunctivae are normal. No scleral icterus. Neck supple.  Cardiovascular: Normal rate, regular rhythm.  Pulmonary/chest: Effort normal and breath sounds normal. No wheezing, rales or rhonchi. Abdominal: Soft, nondistended, nontender. There are no masses palpable. No hepatomegaly. Extremities: no edema Lymphadenopathy: No cervical adenopathy noted. Neurological: Alert and oriented to person place and time. Skin: Skin is warm and dry. No rashes noted. Psychiatric: Normal mood and affect. Behavior is normal.   ASSESSMENT AND PLAN: 84 year old female here for reassessment of the following issues  Chronic dyspepsia / nausea - suspect in part related to chronic narcotic use which we have previously discussed at length. Given history of falls and possible liver disease do not recommend long standing narcotics and benzodiazepine use, although she has used them for years and doesn't think she can come off, continues to follow with pain management. She understands my concern about long-term narcotic use. Otherwise now on buspirone, Zofran as needed, doing really well in regards to the symptom and feeling much better compared to previous. continue present regimen for now.  Abnormal liver imaging - cirrhotic appearing liver on regular ultrasound, however elastography shows score of mostly F2, some of F3. Spleen and platelets are normal. LFTs are normal.  I discussed that it is possible she has cirrhosis, however I'm not convinced that she has it. We discussed what cirrhosis is, potential risks of decompensation and HCC. We discussed role of liver biopsy to further assess for this, at her age she is not interested in liver biopsy. She's had negative testing for hep B/C. Negative iron studies and alpha one antitrypsin historically. She  denies any significant alcohol use in the past. We will obtain baseline labs periodically and consider a repeat US in 6  months to assess for interval changes and HCC screening, otherwise she declines any invasive workup /  evaluation which I agree with. No varices noted on last EGD.  Raymond Cellar, MD [facility]astro[facility] Pager [phone]

## 2016-08-22 ENCOUNTER — Ambulatory Visit (INDEPENDENT_AMBULATORY_CARE_PROVIDER_SITE_OTHER): Payer: Medicare Other | Admitting: Pharmacist

## 2016-08-22 DIAGNOSIS — I482 Chronic atrial fibrillation: Secondary | ICD-10-CM | POA: Diagnosis not present

## 2016-08-22 DIAGNOSIS — I214 Non-ST elevation (NSTEMI) myocardial infarction: Secondary | ICD-10-CM | POA: Diagnosis not present

## 2016-08-22 DIAGNOSIS — E782 Mixed hyperlipidemia: Secondary | ICD-10-CM | POA: Diagnosis not present

## 2016-08-22 DIAGNOSIS — J301 Allergic rhinitis due to pollen: Secondary | ICD-10-CM | POA: Diagnosis not present

## 2016-08-22 DIAGNOSIS — N3010 Interstitial cystitis (chronic) without hematuria: Secondary | ICD-10-CM | POA: Diagnosis not present

## 2016-08-22 DIAGNOSIS — I4891 Unspecified atrial fibrillation: Secondary | ICD-10-CM

## 2016-08-22 DIAGNOSIS — I69993 Ataxia following unspecified cerebrovascular disease: Secondary | ICD-10-CM | POA: Diagnosis not present

## 2016-08-22 DIAGNOSIS — L309 Dermatitis, unspecified: Secondary | ICD-10-CM | POA: Diagnosis not present

## 2016-08-22 DIAGNOSIS — Z0000 Encounter for general adult medical examination without abnormal findings: Secondary | ICD-10-CM | POA: Diagnosis not present

## 2016-08-22 DIAGNOSIS — N182 Chronic kidney disease, stage 2 (mild): Secondary | ICD-10-CM | POA: Diagnosis not present

## 2016-08-22 DIAGNOSIS — I130 Hypertensive heart and chronic kidney disease with heart failure and stage 1 through stage 4 chronic kidney disease, or unspecified chronic kidney disease: Secondary | ICD-10-CM | POA: Diagnosis not present

## 2016-08-22 DIAGNOSIS — I2510 Atherosclerotic heart disease of native coronary artery without angina pectoris: Secondary | ICD-10-CM | POA: Diagnosis not present

## 2016-08-22 DIAGNOSIS — M8580 Other specified disorders of bone density and structure, unspecified site: Secondary | ICD-10-CM | POA: Diagnosis not present

## 2016-08-22 DIAGNOSIS — I5030 Unspecified diastolic (congestive) heart failure: Secondary | ICD-10-CM | POA: Diagnosis not present

## 2016-08-22 LAB — POCT INR: INR: 1.9

## 2016-09-07 ENCOUNTER — Other Ambulatory Visit: Payer: Self-pay | Admitting: Gastroenterology

## 2016-09-08 ENCOUNTER — Ambulatory Visit (INDEPENDENT_AMBULATORY_CARE_PROVIDER_SITE_OTHER): Payer: Medicare Other | Admitting: *Deleted

## 2016-09-08 ENCOUNTER — Encounter (INDEPENDENT_AMBULATORY_CARE_PROVIDER_SITE_OTHER): Payer: Self-pay

## 2016-09-08 DIAGNOSIS — I214 Non-ST elevation (NSTEMI) myocardial infarction: Secondary | ICD-10-CM | POA: Diagnosis not present

## 2016-09-08 DIAGNOSIS — I4891 Unspecified atrial fibrillation: Secondary | ICD-10-CM | POA: Diagnosis not present

## 2016-09-08 LAB — POCT INR: INR: 1.4

## 2016-09-08 NOTE — Telephone Encounter (Signed)
Yes you can refill. Please give 3 month supply with a refill. thanks

## 2016-09-09 MED ORDER — BUSPIRONE HCL 15 MG PO TABS: 15.0000 mg | ORAL_TABLET | Freq: Two times a day (BID) | ORAL | 3 refills | Status: DC

## 2016-09-09 MED ORDER — PANTOPRAZOLE SODIUM 40 MG PO TBEC: 40.0000 mg | DELAYED_RELEASE_TABLET | Freq: Two times a day (BID) | ORAL | 3 refills | Status: DC

## 2016-09-09 NOTE — Telephone Encounter (Signed)
Vivien Rota can you please change these scripts to 3 month supply with 3 refills? It is coming up as 60 pills total without refills, I've tried to change it but system is not letting me. Thanks!

## 2016-09-09 NOTE — Addendum Note (Signed)
Addended by: Elias Else on: 09/09/2016 09:13 AM   Modules accepted: Orders

## 2016-09-12 ENCOUNTER — Other Ambulatory Visit: Payer: Self-pay | Admitting: Gastroenterology

## 2016-09-19 ENCOUNTER — Ambulatory Visit (INDEPENDENT_AMBULATORY_CARE_PROVIDER_SITE_OTHER): Payer: Medicare Other

## 2016-09-19 DIAGNOSIS — I214 Non-ST elevation (NSTEMI) myocardial infarction: Secondary | ICD-10-CM | POA: Diagnosis not present

## 2016-09-19 DIAGNOSIS — I4891 Unspecified atrial fibrillation: Secondary | ICD-10-CM

## 2016-09-19 LAB — POCT INR: INR: 1.8

## 2016-09-21 ENCOUNTER — Ambulatory Visit (INDEPENDENT_AMBULATORY_CARE_PROVIDER_SITE_OTHER): Payer: Medicare Other | Admitting: Ophthalmology

## 2016-09-21 DIAGNOSIS — D3132 Benign neoplasm of left choroid: Secondary | ICD-10-CM

## 2016-09-21 DIAGNOSIS — H353124 Nonexudative age-related macular degeneration, left eye, advanced atrophic with subfoveal involvement: Secondary | ICD-10-CM | POA: Diagnosis not present

## 2016-09-21 DIAGNOSIS — D3131 Benign neoplasm of right choroid: Secondary | ICD-10-CM

## 2016-09-21 DIAGNOSIS — H35033 Hypertensive retinopathy, bilateral: Secondary | ICD-10-CM

## 2016-09-21 DIAGNOSIS — I10 Essential (primary) hypertension: Secondary | ICD-10-CM

## 2016-09-21 DIAGNOSIS — H353112 Nonexudative age-related macular degeneration, right eye, intermediate dry stage: Secondary | ICD-10-CM | POA: Diagnosis not present

## 2016-09-21 DIAGNOSIS — H43813 Vitreous degeneration, bilateral: Secondary | ICD-10-CM

## 2016-09-29 ENCOUNTER — Encounter (HOSPITAL_COMMUNITY): Payer: Self-pay | Admitting: Radiology

## 2016-09-29 ENCOUNTER — Inpatient Hospital Stay (HOSPITAL_COMMUNITY)
Admission: EM | Admit: 2016-09-29 | Discharge: 2016-10-02 | DRG: 391 | Disposition: A | Discharge status: Home / Self Care | Payer: Medicare Other | Attending: Internal Medicine | Admitting: Internal Medicine

## 2016-09-29 ENCOUNTER — Emergency Department (HOSPITAL_COMMUNITY): Payer: Medicare Other

## 2016-09-29 DIAGNOSIS — I482 Chronic atrial fibrillation, unspecified: Secondary | ICD-10-CM | POA: Diagnosis present

## 2016-09-29 DIAGNOSIS — R1013 Epigastric pain: Secondary | ICD-10-CM | POA: Diagnosis not present

## 2016-09-29 DIAGNOSIS — Z79899 Other long term (current) drug therapy: Secondary | ICD-10-CM

## 2016-09-29 DIAGNOSIS — K559 Vascular disorder of intestine, unspecified: Secondary | ICD-10-CM

## 2016-09-29 DIAGNOSIS — R197 Diarrhea, unspecified: Secondary | ICD-10-CM | POA: Diagnosis not present

## 2016-09-29 DIAGNOSIS — I350 Nonrheumatic aortic (valve) stenosis: Secondary | ICD-10-CM | POA: Diagnosis not present

## 2016-09-29 DIAGNOSIS — R109 Unspecified abdominal pain: Secondary | ICD-10-CM

## 2016-09-29 DIAGNOSIS — E785 Hyperlipidemia, unspecified: Secondary | ICD-10-CM | POA: Diagnosis present

## 2016-09-29 DIAGNOSIS — R1012 Left upper quadrant pain: Secondary | ICD-10-CM | POA: Diagnosis not present

## 2016-09-29 DIAGNOSIS — R110 Nausea: Secondary | ICD-10-CM | POA: Diagnosis not present

## 2016-09-29 DIAGNOSIS — G894 Chronic pain syndrome: Secondary | ICD-10-CM | POA: Diagnosis not present

## 2016-09-29 DIAGNOSIS — Z888 Allergy status to other drugs, medicaments and biological substances status: Secondary | ICD-10-CM

## 2016-09-29 DIAGNOSIS — I132 Hypertensive heart and chronic kidney disease with heart failure and with stage 5 chronic kidney disease, or end stage renal disease: Secondary | ICD-10-CM | POA: Diagnosis not present

## 2016-09-29 DIAGNOSIS — I252 Old myocardial infarction: Secondary | ICD-10-CM

## 2016-09-29 DIAGNOSIS — Z952 Presence of prosthetic heart valve: Secondary | ICD-10-CM

## 2016-09-29 DIAGNOSIS — G8929 Other chronic pain: Secondary | ICD-10-CM

## 2016-09-29 DIAGNOSIS — K5790 Diverticulosis of intestine, part unspecified, without perforation or abscess without bleeding: Secondary | ICD-10-CM | POA: Diagnosis present

## 2016-09-29 DIAGNOSIS — I77811 Abdominal aortic ectasia: Secondary | ICD-10-CM | POA: Diagnosis present

## 2016-09-29 DIAGNOSIS — I2510 Atherosclerotic heart disease of native coronary artery without angina pectoris: Secondary | ICD-10-CM | POA: Diagnosis present

## 2016-09-29 DIAGNOSIS — Z7901 Long term (current) use of anticoagulants: Secondary | ICD-10-CM

## 2016-09-29 DIAGNOSIS — K529 Noninfective gastroenteritis and colitis, unspecified: Secondary | ICD-10-CM | POA: Diagnosis not present

## 2016-09-29 DIAGNOSIS — I272 Pulmonary hypertension, unspecified: Secondary | ICD-10-CM | POA: Diagnosis present

## 2016-09-29 DIAGNOSIS — Z882 Allergy status to sulfonamides status: Secondary | ICD-10-CM

## 2016-09-29 DIAGNOSIS — K2950 Unspecified chronic gastritis without bleeding: Secondary | ICD-10-CM | POA: Diagnosis not present

## 2016-09-29 DIAGNOSIS — N186 End stage renal disease: Secondary | ICD-10-CM | POA: Diagnosis not present

## 2016-09-29 DIAGNOSIS — M549 Dorsalgia, unspecified: Secondary | ICD-10-CM | POA: Diagnosis present

## 2016-09-29 DIAGNOSIS — Z955 Presence of coronary angioplasty implant and graft: Secondary | ICD-10-CM

## 2016-09-29 DIAGNOSIS — Z953 Presence of xenogenic heart valve: Secondary | ICD-10-CM

## 2016-09-29 DIAGNOSIS — Z880 Allergy status to penicillin: Secondary | ICD-10-CM

## 2016-09-29 DIAGNOSIS — H9190 Unspecified hearing loss, unspecified ear: Secondary | ICD-10-CM | POA: Diagnosis present

## 2016-09-29 DIAGNOSIS — I10 Essential (primary) hypertension: Secondary | ICD-10-CM

## 2016-09-29 DIAGNOSIS — I5032 Chronic diastolic (congestive) heart failure: Secondary | ICD-10-CM | POA: Diagnosis not present

## 2016-09-29 DIAGNOSIS — Z79891 Long term (current) use of opiate analgesic: Secondary | ICD-10-CM

## 2016-09-29 DIAGNOSIS — K219 Gastro-esophageal reflux disease without esophagitis: Secondary | ICD-10-CM | POA: Diagnosis present

## 2016-09-29 LAB — URINALYSIS, ROUTINE W REFLEX MICROSCOPIC
BACTERIA UA: NONE SEEN
Bilirubin Urine: NEGATIVE
Glucose, UA: NEGATIVE mg/dL
KETONES UR: NEGATIVE mg/dL
LEUKOCYTES UA: NEGATIVE
Nitrite: NEGATIVE
PROTEIN: NEGATIVE mg/dL
Specific Gravity, Urine: 1.003 — ABNORMAL LOW (ref 1.005–1.030)
pH: 7 (ref 5.0–8.0)

## 2016-09-29 LAB — COMPREHENSIVE METABOLIC PANEL
ALBUMIN: 3.6 g/dL (ref 3.5–5.0)
ALK PHOS: 96 U/L (ref 38–126)
ALT: 23 U/L (ref 14–54)
ANION GAP: 8 (ref 5–15)
AST: 36 U/L (ref 15–41)
BILIRUBIN TOTAL: 1.3 mg/dL — AB (ref 0.3–1.2)
BUN: 10 mg/dL (ref 6–20)
CALCIUM: 8.8 mg/dL — AB (ref 8.9–10.3)
CO2: 24 mmol/L (ref 22–32)
Chloride: 96 mmol/L — ABNORMAL LOW (ref 101–111)
Creatinine, Ser: 0.59 mg/dL (ref 0.44–1.00)
GFR calc Af Amer: >60 mL/min (ref >60)
GFR calc non Af Amer: >60 mL/min (ref >60)
GLUCOSE: 108 mg/dL — AB (ref 65–99)
Potassium: 3.7 mmol/L (ref 3.5–5.1)
SODIUM: 128 mmol/L — AB (ref 135–145)
TOTAL PROTEIN: 7.4 g/dL (ref 6.5–8.1)

## 2016-09-29 LAB — I-STAT CHEM 8, ED
BUN: 9 mg/dL (ref 6–20)
CREATININE: 0.5 mg/dL (ref 0.44–1.00)
Calcium, Ion: 1.11 mmol/L — ABNORMAL LOW (ref 1.15–1.40)
Chloride: 95 mmol/L — ABNORMAL LOW (ref 101–111)
GLUCOSE: 108 mg/dL — AB (ref 65–99)
HEMATOCRIT: 42 % (ref 36.0–46.0)
Hemoglobin: 14.3 g/dL (ref 12.0–15.0)
Potassium: 3.8 mmol/L (ref 3.5–5.1)
Sodium: 130 mmol/L — ABNORMAL LOW (ref 135–145)
TCO2: 24 mmol/L (ref 0–100)

## 2016-09-29 LAB — CBC WITH DIFFERENTIAL/PLATELET
BASOS ABS: 0 10*3/uL (ref 0.0–0.1)
BASOS PCT: 0 %
EOS ABS: 0 10*3/uL (ref 0.0–0.7)
Eosinophils Relative: 0 %
HEMATOCRIT: 38.2 % (ref 36.0–46.0)
HEMOGLOBIN: 13.2 g/dL (ref 12.0–15.0)
Lymphocytes Relative: 14 %
Lymphs Abs: 1.3 10*3/uL (ref 0.7–4.0)
MCH: 29.4 pg (ref 26.0–34.0)
MCHC: 34.6 g/dL (ref 30.0–36.0)
MCV: 85.1 fL (ref 78.0–100.0)
MONOS PCT: 9 %
Monocytes Absolute: 0.9 10*3/uL (ref 0.1–1.0)
NEUTROS ABS: 7.3 10*3/uL (ref 1.7–7.7)
NEUTROS PCT: 77 %
Platelets: 235 10*3/uL (ref 150–400)
RBC: 4.49 MIL/uL (ref 3.87–5.11)
RDW: 12.8 % (ref 11.5–15.5)
WBC: 9.5 10*3/uL (ref 4.0–10.5)

## 2016-09-29 LAB — I-STAT TROPONIN, ED: Troponin i, poc: 0 ng/mL (ref 0.00–0.08)

## 2016-09-29 LAB — PROTIME-INR
INR: 1.7
Prothrombin Time: 20.2 seconds — ABNORMAL HIGH (ref 11.4–15.2)

## 2016-09-29 LAB — LIPASE, BLOOD: Lipase: 30 U/L (ref 11–51)

## 2016-09-29 MED ORDER — CIPROFLOXACIN IN D5W 400 MG/200ML IV SOLN: 400.0000 mg | Freq: Two times a day (BID) | INTRAVENOUS | Status: DC

## 2016-09-29 MED ORDER — MORPHINE SULFATE (PF) 4 MG/ML IV SOLN
INTRAVENOUS | Status: AC
  Administered 2016-09-29: 2 mg via INTRAVENOUS
  Filled 2016-09-29: qty 1

## 2016-09-29 MED ORDER — DILTIAZEM HCL 30 MG PO TABS
120.0000 mg | ORAL_TABLET | Freq: Two times a day (BID) | ORAL | Status: DC
  Administered 2016-09-29 – 2016-10-02 (×6): 120 mg via ORAL
  Filled 2016-09-29 (×6): qty 1

## 2016-09-29 MED ORDER — ONDANSETRON HCL 4 MG/2ML IJ SOLN
4.0000 mg | Freq: Once | INTRAMUSCULAR | Status: AC
  Administered 2016-09-29: 4 mg via INTRAVENOUS
  Filled 2016-09-29: qty 2

## 2016-09-29 MED ORDER — WARFARIN - PHARMACIST DOSING INPATIENT: Freq: Every day | Status: DC

## 2016-09-29 MED ORDER — IOPAMIDOL (ISOVUE-300) INJECTION 61%
INTRAVENOUS | Status: AC
  Filled 2016-09-29: qty 100

## 2016-09-29 MED ORDER — WARFARIN SODIUM 5 MG PO TABS: 5.0000 mg | ORAL_TABLET | ORAL | Status: DC

## 2016-09-29 MED ORDER — MORPHINE SULFATE (PF) 4 MG/ML IV SOLN
2.0000 mg | Freq: Once | INTRAVENOUS | Status: AC
  Administered 2016-09-29: 2 mg via INTRAVENOUS
  Filled 2016-09-29: qty 1

## 2016-09-29 MED ORDER — WARFARIN SODIUM 2.5 MG PO TABS: 2.5000 mg | ORAL_TABLET | ORAL | Status: DC

## 2016-09-29 MED ORDER — ONDANSETRON HCL 4 MG/2ML IJ SOLN
4.0000 mg | Freq: Four times a day (QID) | INTRAMUSCULAR | Status: DC | PRN
  Administered 2016-09-30: 4 mg via INTRAVENOUS
  Filled 2016-09-29: qty 2

## 2016-09-29 MED ORDER — HYDROCODONE-ACETAMINOPHEN 5-325 MG PO TABS
1.0000 | ORAL_TABLET | Freq: Four times a day (QID) | ORAL | Status: DC | PRN
  Administered 2016-09-30 – 2016-10-02 (×6): 1 via ORAL
  Filled 2016-09-29 (×7): qty 1

## 2016-09-29 MED ORDER — IOPAMIDOL (ISOVUE-300) INJECTION 61%
100.0000 mL | Freq: Once | INTRAVENOUS | Status: AC | PRN
Start: 2016-09-29 — End: 2016-09-29
  Administered 2016-09-29: 100 mL via INTRAVENOUS

## 2016-09-29 MED ORDER — PANTOPRAZOLE SODIUM 40 MG PO TBEC
40.0000 mg | DELAYED_RELEASE_TABLET | Freq: Two times a day (BID) | ORAL | Status: DC
  Administered 2016-09-29 – 2016-10-02 (×6): 40 mg via ORAL
  Filled 2016-09-29 (×6): qty 1

## 2016-09-29 MED ORDER — SODIUM CHLORIDE 0.9 % IV BOLUS (SEPSIS)
1000.0000 mL | Freq: Once | INTRAVENOUS | Status: AC
  Administered 2016-09-29: 1000 mL via INTRAVENOUS

## 2016-09-29 MED ORDER — METRONIDAZOLE IN NACL 5-0.79 MG/ML-% IV SOLN
500.0000 mg | Freq: Three times a day (TID) | INTRAVENOUS | Status: DC
  Administered 2016-09-30 – 2016-10-02 (×7): 500 mg via INTRAVENOUS
  Filled 2016-09-29 (×9): qty 100

## 2016-09-29 MED ORDER — SODIUM CHLORIDE 0.9 % IV SOLN
INTRAVENOUS | Status: DC
  Administered 2016-09-29: via INTRAVENOUS

## 2016-09-29 MED ORDER — METRONIDAZOLE IN NACL 5-0.79 MG/ML-% IV SOLN: 500.0000 mg | Freq: Three times a day (TID) | INTRAVENOUS | Status: DC

## 2016-09-29 MED ORDER — CIPROFLOXACIN IN D5W 400 MG/200ML IV SOLN
400.0000 mg | Freq: Once | INTRAVENOUS | Status: AC
  Administered 2016-09-29: 400 mg via INTRAVENOUS
  Filled 2016-09-29: qty 200

## 2016-09-29 MED ORDER — CALCIUM CARBONATE ANTACID 500 MG PO CHEW
1.0000 | CHEWABLE_TABLET | Freq: Once | ORAL | Status: AC
  Administered 2016-09-29: 200 mg via ORAL
  Filled 2016-09-29 (×2): qty 1

## 2016-09-29 MED ORDER — LISINOPRIL 20 MG PO TABS
20.0000 mg | ORAL_TABLET | Freq: Every day | ORAL | Status: DC
  Administered 2016-09-30 – 2016-10-02 (×3): 20 mg via ORAL
  Filled 2016-09-29 (×3): qty 1

## 2016-09-29 MED ORDER — MORPHINE SULFATE (PF) 4 MG/ML IV SOLN
2.0000 mg | Freq: Once | INTRAVENOUS | Status: AC
Start: 2016-09-29 — End: 2016-09-29
  Administered 2016-09-29: 2 mg via INTRAVENOUS

## 2016-09-29 MED ORDER — CIPROFLOXACIN IN D5W 400 MG/200ML IV SOLN
400.0000 mg | Freq: Two times a day (BID) | INTRAVENOUS | Status: DC
  Administered 2016-09-30 – 2016-10-01 (×4): 400 mg via INTRAVENOUS
  Filled 2016-09-29 (×4): qty 200

## 2016-09-29 MED ORDER — METRONIDAZOLE IN NACL 5-0.79 MG/ML-% IV SOLN
500.0000 mg | Freq: Once | INTRAVENOUS | Status: AC
  Administered 2016-09-29: 500 mg via INTRAVENOUS
  Filled 2016-09-29: qty 100

## 2016-09-29 MED ORDER — ATORVASTATIN CALCIUM 20 MG PO TABS
20.0000 mg | ORAL_TABLET | Freq: Every day | ORAL | Status: DC
  Administered 2016-09-30 – 2016-10-02 (×3): 20 mg via ORAL
  Filled 2016-09-29 (×3): qty 1

## 2016-09-29 MED ORDER — ACETAMINOPHEN 650 MG RE SUPP: 650.0000 mg | Freq: Four times a day (QID) | RECTAL | Status: DC | PRN

## 2016-09-29 MED ORDER — ONDANSETRON HCL 4 MG PO TABS
4.0000 mg | ORAL_TABLET | Freq: Four times a day (QID) | ORAL | Status: DC | PRN
Start: 2016-09-29 — End: 2016-10-02

## 2016-09-29 MED ORDER — ACETAMINOPHEN 325 MG PO TABS: 650.0000 mg | ORAL_TABLET | Freq: Four times a day (QID) | ORAL | Status: DC | PRN

## 2016-09-29 MED ORDER — MORPHINE SULFATE (PF) 2 MG/ML IV SOLN
2.0000 mg | INTRAVENOUS | Status: DC | PRN
  Administered 2016-09-30 – 2016-10-01 (×5): 2 mg via INTRAVENOUS
  Filled 2016-09-29 (×5): qty 1

## 2016-09-29 MED ORDER — DIAZEPAM 5 MG PO TABS
5.0000 mg | ORAL_TABLET | Freq: Three times a day (TID) | ORAL | Status: DC | PRN
  Administered 2016-09-29 – 2016-10-01 (×3): 5 mg via ORAL
  Filled 2016-09-29 (×3): qty 1

## 2016-09-29 NOTE — ED Provider Notes (Signed)
On repeat exam after CT results are available and I discussed with the patient, she was nauseous, and pain. With concerning findings of colitis, the patient will be admitted for continued fluids, antibiotics, due to her by mouth intolerance and ongoing nausea.   Carmin Muskrat, MD 09/29/16 2051

## 2016-09-29 NOTE — ED Provider Notes (Signed)
[facility] DEPT Provider Note   CSN: [id number] Arrival date & time: 09/29/16  1502     History   Chief Complaint Chief Complaint  Patient presents with  . Abdominal Pain  . Nausea    HPI Brittany [name] is a 83 y.o. female.  83 yo F with a chief complaint of epigastric abdominal pain and diarrhea. Going on for the past couple days. Denies fevers or chills. Denies chest pain shortness of breath. Denies exertional symptoms. Denies lower extremity edema. Called her family physician who suggested she come to the ED   The history is provided by the patient.  Abdominal Pain   This is a new problem. The current episode started 2 days ago. The problem occurs constantly. The problem has been gradually worsening. The pain is located in the epigastric region. The quality of the pain is sharp and shooting. The pain is at a severity of 7/10. The pain is moderate. Associated symptoms include diarrhea and nausea. Pertinent negatives include fever, vomiting, dysuria, headaches, arthralgias and myalgias. Nothing aggravates the symptoms. Nothing relieves the symptoms.    Past Medical History:  Diagnosis Date  . Arthritis   . Atrial fibrillation ([place])   . Chronic back pain   . Diverticulosis   . Dyslipidemia    takes Lipitor daily  . Dysrhythmia    Atrial Fibrillation  . Foot drop    SINCE 1987  . GERD (gastroesophageal reflux disease)   . H/O hiatal hernia   . Hearing impaired    Bilateral hearing aids  . History of bacterial endocarditis   . History of gastritis   . History of stomach ulcers    many yrs ago  . History of TIA (transient ischemic attack)    2013-- RESIDUAL PERIPHERAL VISION RIGHT EYE--  RESOLVED  . Hypertension   . IC (interstitial cystitis)   . Pulmonary hypertension ([place]) 10/2013   Based on TTE  . S/P aortic valve replacement    2005  . Urine incontinence     Patient Active Problem List   Diagnosis Date Noted  . Dyspepsia 04/14/2016  . Cough 12/02/2014   . Emphysema lung ([place]) 12/02/2014  . Oral thrush 12/02/2014  . Multiple lung nodules on CT 10/07/2014  . Nausea without vomiting 09/30/2014  . Hyponatremia 08/27/2014  . Nausea & vomiting 08/27/2014  . Anemia 02/06/2014  . Atrial fibrillation ([place]) 10/23/2013  . Perioral numbness 10/19/2013  . Headache(784.0) 10/12/2013  . Chronic diastolic heart failure ([place]) 10/12/2013  . Atelectasis 10/12/2013  . Pulmonary hypertension ([place]) 10/12/2013  . Cephalalgia   . Dizziness 10/11/2013  . Headache 10/11/2013  . S/P AVR (aortic valve replacement) 11/19/2012  . Abdominal pain, periumbilic 65/46/5035  . 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 Calculus of bile duct without mention of cholecystitis or obstruction 08/10/2012  . Chronic gastritis 07/16/2012  . Nausea alone 05/21/2012  . Abdominal pain, epigastric 05/21/2012  . Cellulitis of foot 01/01/2012  . Dysphagia, unspecified(787.20) 12/26/2011  . Esophageal reflux 12/26/2011  . Dyspnea on exertion 11/18/2011  . Chronic atrial fibrillation ([place]) 11/07/2011  . Chest pain 06/06/2011  . Aortic stenosis 03/24/2011  . Hypertension 03/24/2011  . H/O aortic valve replacement 10/02/2003    Past Surgical History:  Procedure Laterality Date  . ABDOMINAL HYSTERECTOMY  1967  . AORTIC VALVE REPLACEMENT  09-29-2003    [facility] pericardial tissue valve  . APPENDECTOMY  1933  . CARDIAC CATHETERIZATION  07-15-2003 DR HELEN PRESTON   MODERATE TO MODERATELY SEVERE  CALCIFIC AORTIC STENOSIS/  NORMAL CORONARY ARTERIES  . CARDIOVASCULAR STRESS TEST  01-30-2012  DR NASHER   NORMAL NUCLEAR STUDY/  EF 73%/  NORMAL LVF  . CARPAL TUNNEL RELEASE Bilateral   . CATARACT EXTRACTION W/ INTRAOCULAR LENS  IMPLANT, BILATERAL    . CHOLECYSTECTOMY  1993  . CYSTO WITH HYDRODISTENSION N/A 02/05/2013   Procedure: CYSTOSCOPY/HYDRODISTENSION;  Surgeon: Irine Seal, MD;  Location: [facility];  Service: Urology;  Laterality: N/A;  . CYSTO/  HYDRODISTENTION/ INSTILLATION CLORPACTIN  MULTIPLE  last one 2009  . DILATION AND CURETTAGE OF UTERUS    . ERCP N/A 08/10/2012   Procedure: ENDOSCOPIC RETROGRADE CHOLANGIOPANCREATOGRAPHY (ERCP);  Surgeon: Inda Castle, MD;  Location: [facility];  Service: Gastroenterology;  Laterality: N/A;  . ESOPHAGOGASTRODUODENOSCOPY (EGD) WITH PROPOFOL N/A 08/18/2014   Procedure: ESOPHAGOGASTRODUODENOSCOPY (EGD) WITH PROPOFOL;  Surgeon: Inda Castle, MD;  Location: [facility];  Service: Endoscopy;  Laterality: N/A;  . LEFT HEART CATHETERIZATION WITH CORONARY ANGIOGRAM N/A 10/18/2013   Procedure: LEFT HEART CATHETERIZATION WITH CORONARY ANGIOGRAM;  Surgeon: Jettie Booze, MD;  Location: [facility] CATH LAB;  Service: Cardiovascular;  Laterality: N/A;  . LUMBAR [place] &  2007  . MACROPLASTIQUE URETHRAL IMPLANTATION  08-27-2009  . MINI-OPEN RIGHT ROTATOR CUFF REPAIR  07-12-2001  . RIGHT SHOULDER ARTHROSCOPY W/ DEBRIDEMENT ROTATOR CUFF AND LABRAL TEAR/ ACROMINOPLASTY/ DISTAL CLAVICLE EXCISION/ CA LIGAMENT RELEASE  10-08-1999  . TONSILLECTOMY AND ADENOIDECTOMY    . TRANSTHORACIC ECHOCARDIOGRAM  08-10-2011   MILD LVH/  EF 55-60%/  NORMAL AVR TISSUE/ MILD MR/  MODERATE DILATED LA/  MILD DILATED RA  . TRANSVAGINAL TAPE PROCEDURE  07-30-2002  . VEIN LIGATION  1969   RIGHT LOWER LEG    OB History    No data available       Home Medications    Prior to Admission medications   Medication Sig Start Date End Date Taking? Authorizing Provider  atorvastatin (LIPITOR) 20 MG tablet Take 20 mg by mouth daily.   Yes [provider]  cholecalciferol (VITAMIN D) 1000 units tablet Take 1,000 Units by mouth daily.   Yes [provider]  diazepam (VALIUM) 5 MG tablet Take 5 mg by mouth every 8 (eight) hours as needed for anxiety (and/or sleep).    Yes [provider]  diltiazem (CARDIZEM) 120 MG tablet Take 120 mg by mouth 2 (two) times daily.   Yes [provider]    fluticasone (FLONASE) 50 MCG/ACT nasal spray Place 2 sprays into both nostrils daily as needed for rhinitis.   Yes [provider]  furosemide (LASIX) 40 MG tablet Take 1 tablet (40 mg total) by mouth daily. Patient taking differently: Take 40-80 mg by mouth daily as needed for edema.  01/29/16  Yes Nahser, Wonda Cheng, MD  HYDROcodone-acetaminophen (NORCO/VICODIN) 5-325 MG per tablet Take 1 tablet by mouth every 6 (six) hours as needed for moderate pain.    Yes [provider]  hyoscyamine (LEVSIN, ANASPAZ) 0.125 MG tablet Take 0.125 mg by mouth every 4 (four) hours as needed for cramping.    Yes [provider]  ipratropium (ATROVENT) 0.06 % nasal spray Place 2 sprays into both nostrils 3 (three) times daily.   Yes [provider]  lisinopril (PRINIVIL,ZESTRIL) 40 MG tablet Take 20 mg by mouth daily.    Yes [provider]  Multiple Vitamins-Minerals (PRESERVISION/LUTEIN) CAPS Take 1 capsule by mouth 2 (two) times daily.   Yes [provider]  omega-3  acid ethyl esters (LOVAZA) 1 g capsule Take 1 g by mouth daily.   Yes [provider]  ondansetron (ZOFRAN) 4 MG tablet Take 4 mg by mouth every 4 (four) hours as needed for nausea or vomiting.   Yes [provider]  oxybutynin (DITROPAN) 5 MG tablet Take 5-10 mg by mouth 2 (two) times daily. Pt takes one tablet in the morning and two at night.   Yes [provider]  pantoprazole (PROTONIX) 40 MG tablet Take 1 tablet (40 mg total) by mouth 2 (two) times daily. 09/09/16  Yes Armbruster, Renelda Loma, MD  potassium chloride (K-DUR,KLOR-CON) 10 MEQ tablet Take 10 mEq by mouth daily as needed (when taking Lasix).   Yes [provider]  promethazine (PHENERGAN) 12.5 MG tablet Take 1 tablet (12.5 mg total) by mouth every 6 (six) hours as needed for nausea or vomiting. 08/30/14  Yes Ghimire, Henreitta Leber, MD  ranitidine (ZANTAC) 150 MG tablet Take 150 mg by mouth at bedtime as  needed for heartburn.    Yes [provider]  warfarin (COUMADIN) 5 MG tablet Take 2.5-5 mg by mouth every evening. Pt takes one-half tablet on Saturday and one tablet all other days.   Yes [provider]    Family History Family History  Problem Relation Age of Onset  . Heart disease Father   . Stomach cancer Maternal Grandmother   . Esophageal cancer Maternal Grandfather   . Bipolar disorder Son        Committed Suicide  . Coronary artery disease Brother   . Bipolar disorder Brother        commited suiside at 40yo  . Alcohol abuse Sister        sister #1  . Alcohol abuse Sister        sister #2  . Colon cancer Maternal Aunt   . Lung disease Neg Hx     Social History Social History  Substance Use Topics  . Smoking status: Former Smoker    Packs/day: 0.50    Years: 40.00    Types: Cigarettes    Start date: 05/06/1949    Quit date: 03/06/1990  . Smokeless tobacco: Never Used  . Alcohol use No     Comment: Remote social EtOH     Allergies   Amlodipine; Amoxicillin; Carafate [sucralfate]; Cephalexin; Codeine; Irbesartan; Morphine and related; Neurontin [gabapentin]; Nitrofurantoin monohyd macro; Prednisone; and Sulfa antibiotics   Review of Systems Review of Systems  Constitutional: Negative for chills and fever.  HENT: Negative for congestion and rhinorrhea.   Eyes: Negative for redness and visual disturbance.  Respiratory: Negative for shortness of breath and wheezing.   Cardiovascular: Negative for chest pain and palpitations.  Gastrointestinal: Positive for abdominal pain, diarrhea and nausea. Negative for vomiting.  Genitourinary: Negative for dysuria and urgency.  Musculoskeletal: Negative for arthralgias and myalgias.  Skin: Negative for pallor and wound.  Neurological: Negative for dizziness and headaches.     Physical Exam Updated Vital Signs BP (!) 195/99   Pulse 99   Temp 97.9 F (36.6 C) (Axillary)   Ht 5\' 1"  (1.549 m)   Wt  60.3 kg (133 lb)   SpO2 96%   BMI 25.13 kg/m   Physical Exam  Constitutional: She is oriented to person, place, and time. She appears well-developed and well-nourished. No distress.  HENT:  Head: Normocephalic and atraumatic.  Eyes: EOM are normal. Pupils are equal, round, and reactive to light.  Neck: Normal range of motion. Neck  supple.  Cardiovascular: Normal rate and regular rhythm.  Exam reveals no gallop and no friction rub.   No murmur heard. Pulmonary/Chest: Effort normal. She has no wheezes. She has no rales.  Abdominal: Soft. She exhibits no distension and no mass. There is tenderness (epigastric, ruq, worst epigastrium). There is no guarding.  Musculoskeletal: She exhibits no edema or tenderness.  Neurological: She is alert and oriented to person, place, and time.  Skin: Skin is warm and dry. She is not diaphoretic.  Psychiatric: She has a normal mood and affect. Her behavior is normal.  Nursing note and vitals reviewed.    ED Treatments / Results  Labs (all labs ordered are listed, but only abnormal results are displayed) Labs Reviewed  I-STAT CHEM 8, ED - Abnormal; Notable for the following:       Result Value   Sodium 130 (*)    Chloride 95 (*)    Glucose, Bld 108 (*)    Calcium, Ion 1.11 (*)    All other components within normal limits  CBC WITH DIFFERENTIAL/PLATELET  COMPREHENSIVE METABOLIC PANEL  LIPASE, BLOOD  URINALYSIS, ROUTINE W REFLEX MICROSCOPIC  I-STAT TROPOININ, ED    EKG  EKG Interpretation  Date/Time:  Thursday September 29 2016 16:04:00 EDT Ventricular Rate:  79 PR Interval:    QRS Duration: 107 QT Interval:  402 QTC Calculation: 461 R Axis:   -84 Text Interpretation:  Atrial fibrillation Left axis deviation Anterior infarct, old No significant change since last tracing Confirmed by Deno Etienne [phone]) on 09/29/2016 4:29:31 PM       Radiology No results found.  Procedures Procedures (including critical care time)  Medications Ordered  in ED Medications  sodium chloride 0.9 % bolus 1,000 mL (1,000 mLs Intravenous New Bag/Given 09/29/16 1610)  morphine 4 MG/ML injection 2 mg (2 mg Intravenous Given 09/29/16 1610)  ondansetron (ZOFRAN) injection 4 mg (4 mg Intravenous Given 09/29/16 1610)     Initial Impression / Assessment and Plan / ED Course  I have reviewed the triage vital signs and the nursing notes.  Pertinent labs & imaging results that were available during my care of the patient were reviewed by me and considered in my medical decision making (see chart for details).     83 yo F With a chief complaint of epigastric abdominal pain and diarrhea. Going on for the past couple days. On my exam was significant epigastric pain, will CT.  Labs, fluids.   Discussed case with Dr. Vanita Panda, please see his note for further details.   The patients results and plan were reviewed and discussed.   Any x-rays performed were independently reviewed by myself.   Differential diagnosis were considered with the presenting HPI.  Medications  sodium chloride 0.9 % bolus 1,000 mL (1,000 mLs Intravenous New Bag/Given 09/29/16 1610)  morphine 4 MG/ML injection 2 mg (2 mg Intravenous Given 09/29/16 1610)  ondansetron (ZOFRAN) injection 4 mg (4 mg Intravenous Given 09/29/16 1610)    Vitals:   09/29/16 1526 09/29/16 1527  BP: (!) 195/99   Pulse: 99   Temp: 97.9 F (36.6 C)   TempSrc: Axillary   SpO2: 96%   Weight:  60.3 kg (133 lb)  Height:  5\' 1"  (1.549 m)    Final diagnoses:  Diarrhea, unspecified type  Epigastric abdominal pain       Final Clinical Impressions(s) / ED Diagnoses   Final diagnoses:  Diarrhea, unspecified type  Epigastric abdominal pain    New Prescriptions New Prescriptions  No medications on file     Deno Etienne, DO 09/29/16 1645

## 2016-09-29 NOTE — ED Triage Notes (Signed)
Pt c/o nausea and diarrhea x 2 days with right upper quadrant abdominal pain. Alert and oriented x4. No acute distress.

## 2016-09-29 NOTE — ED Notes (Signed)
Bed: ZP91 Expected date: 09/29/16 Expected time: 3:10 PM Means of arrival: Ambulance Comments: N/V/D

## 2016-09-29 NOTE — ED Notes (Signed)
Pt has been using bedpan but sample has been contaminated each time

## 2016-09-29 NOTE — H&P (Signed)
[facility] History and Physical  Brittany [name] ENI:[id number] DOB: Sep 01, 1927 DOA: 09/29/2016  Referring physician: Dr Vanita Panda PCP: Mayra Neer, MD   Chief Complaint: Abd pain, nausea, diarrhea  HPI: Brittany [name] is a 83 y.o. female with hx of with HTN, diast CHF, afib , bioAVR , chronic gastritis presenting with 2 day history of LUQ abd pain, nausea and diarrhea.  No bloody stool.  5-10 stool sper day.  No fevers. Not eating x 2 days. Unable to eat in ED.   In ED CT scan showing mild thickening of colonic wall c/w colitis.  Getting IV cipro/ flagyl and IV msoO4, asked to see for admission.   History as above per pt.  Denies any CP, SOB, no fever or chills. Hx multiple abd surgeries including ruptured ovarian cyst which turned out to be a pregnancy, BTL and then TAH.  Also sp appy and cholecystectomy.  More recently lin 2015 had NSTEMI with tight RCA lesion stented.  Hx CVA but pt says she doesn't really believe it (related to some visual field defect).    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  Husband had 35 yrs of T1DM and died 5 yrs ago with COPD.  Pt lives w/ one of her sons and his family.  Uses a walker all the time for balance, still driving and cares for herself otherwise.   Chart  Jul '15 - hx CVA, admitted for diast CHF and HTN urgency/ malig HTN, afib, hx bioAVR, HA.  Felt due to vol overload, improved with diuresis.  BP's meds adjusted. HA's with normal ESRD.  afib on xarelto.   Jul '15 - chest pain, heart cath showed tight RCA ostial stenosis treated with DES successfully.  DC'd on Brilanta due to ^P2Y12 level.   May '16 - hx of chronic nausea/ vomiting since Jan, EGD showed chronic gastritis as OP. CT as IP no sig changes. GES was negative.  Improved with Ativan, GI felt it was functional N/V.  DC'd home. Chronic afig on atenolol and Cardizem, coumadin. Hx CAD w stent 2015.  Bio AVR 2005.   ROS  denies CP  no  joint pain   no HA  no blurry vision  no rash  no diarrhea  no nausea/ vomiting  no dysuria  no difficulty voiding  no change in urine color    Past Medical History  Past Medical History:  Diagnosis Date  . Arthritis   . Atrial fibrillation ([place])   . Chronic back pain   . Diverticulosis   . Dyslipidemia    takes Lipitor daily  . Dysrhythmia    Atrial Fibrillation  . Foot drop    SINCE 1987  . GERD (gastroesophageal reflux disease)   . H/O hiatal hernia   . Hearing impaired    Bilateral hearing aids  . History of bacterial endocarditis   . History of gastritis   . History of stomach ulcers    many yrs ago  . History of TIA (transient ischemic attack)    2013-- RESIDUAL PERIPHERAL VISION RIGHT EYE--  RESOLVED  . Hypertension   . IC (interstitial cystitis)   . Pulmonary hypertension ([place]) 10/2013   Based on TTE  . S/P aortic valve replacement    2005  . Urine incontinence    Past Surgical History  Past Surgical History:  Procedure Laterality Date  . ABDOMINAL HYSTERECTOMY  1967  . AORTIC VALVE  REPLACEMENT  09-29-2003    [facility] pericardial tissue valve  . APPENDECTOMY  1933  . CARDIAC CATHETERIZATION  07-15-2003 DR HELEN PRESTON   MODERATE TO MODERATELY SEVERE CALCIFIC AORTIC STENOSIS/  NORMAL CORONARY ARTERIES  . CARDIOVASCULAR STRESS TEST  01-30-2012  DR NASHER   NORMAL NUCLEAR STUDY/  EF 73%/  NORMAL LVF  . CARPAL TUNNEL RELEASE Bilateral   . CATARACT EXTRACTION W/ INTRAOCULAR LENS  IMPLANT, BILATERAL    . CHOLECYSTECTOMY  1993  . CYSTO WITH HYDRODISTENSION N/A 02/05/2013   Procedure: CYSTOSCOPY/HYDRODISTENSION;  Surgeon: Irine Seal, MD;  Location: [facility];  Service: Urology;  Laterality: N/A;  . CYSTO/ HYDRODISTENTION/ INSTILLATION CLORPACTIN  MULTIPLE  last one 2009  . DILATION AND CURETTAGE OF UTERUS    . ERCP N/A 08/10/2012   Procedure: ENDOSCOPIC RETROGRADE CHOLANGIOPANCREATOGRAPHY (ERCP);  Surgeon: Inda Castle, MD;   Location: [place];  Service: Gastroenterology;  Laterality: N/A;  . ESOPHAGOGASTRODUODENOSCOPY (EGD) WITH PROPOFOL N/A 08/18/2014   Procedure: ESOPHAGOGASTRODUODENOSCOPY (EGD) WITH PROPOFOL;  Surgeon: Inda Castle, MD;  Location: [facility];  Service: Endoscopy;  Laterality: N/A;  . LEFT HEART CATHETERIZATION WITH CORONARY ANGIOGRAM N/A 10/18/2013   Procedure: LEFT HEART CATHETERIZATION WITH CORONARY ANGIOGRAM;  Surgeon: Jettie Booze, MD;  Location: [facility] CATH LAB;  Service: Cardiovascular;  Laterality: N/A;  . LUMBAR [place] &  2007  . MACROPLASTIQUE URETHRAL IMPLANTATION  08-27-2009  . MINI-OPEN RIGHT ROTATOR CUFF REPAIR  07-12-2001  . RIGHT SHOULDER ARTHROSCOPY W/ DEBRIDEMENT ROTATOR CUFF AND LABRAL TEAR/ ACROMINOPLASTY/ DISTAL CLAVICLE EXCISION/ CA LIGAMENT RELEASE  10-08-1999  . TONSILLECTOMY AND ADENOIDECTOMY    . TRANSTHORACIC ECHOCARDIOGRAM  08-10-2011   MILD LVH/  EF 55-60%/  NORMAL AVR TISSUE/ MILD MR/  MODERATE DILATED LA/  MILD DILATED RA  . TRANSVAGINAL TAPE PROCEDURE  07-30-2002  . VEIN LIGATION  1969   RIGHT LOWER LEG   Family History  Family History  Problem Relation Age of Onset  . Heart disease Father   . Stomach cancer Maternal Grandmother   . Esophageal cancer Maternal Grandfather   . Bipolar disorder Son        Committed Suicide  . Coronary artery disease Brother   . Bipolar disorder Brother        commited suiside at 58yo  . Alcohol abuse Sister        sister #1  . Alcohol abuse Sister        sister #2  . Colon cancer Maternal Aunt   . Lung disease Neg Hx    Social History  reports that she quit smoking about 26 years ago. Her smoking use included Cigarettes. She started smoking about 67 years ago. She has a 20.00 pack-year smoking history. She has never used smokeless tobacco. She reports that she does not drink alcohol or use drugs. Allergies  Allergies  Allergen Reactions  . Amlodipine Swelling and Other (See Comments)    Reaction:   Unspecified swelling reaction  . Amoxicillin Diarrhea, Nausea And Vomiting and Other (See Comments)    Has patient had a PCN reaction causing immediate rash, facial/tongue/throat swelling, SOB or lightheadedness with hypotension: No Has patient had a PCN reaction causing severe rash involving mucus membranes or skin necrosis: No Has patient had a PCN reaction that required hospitalization: No Has patient had a PCN reaction occurring within the last 10 years: No If all of the above answers are "NO", then may proceed with Cephalosporin use.  Ivin Booty [  Sucralfate] Swelling and Other (See Comments)    Reaction:  Knee swelling/redness   . Cephalexin Diarrhea  . Codeine Nausea And Vomiting  . Irbesartan Swelling and Other (See Comments)    Reaction:  Facial/hand swelling and numbness   . Morphine And Related Other (See Comments)    Pt states that she has a history of addiction with this medication.      . Neurontin [Gabapentin] Swelling and Other (See Comments)    Reaction:  Leg swelling   . Nitrofurantoin Monohyd Macro Diarrhea and Nausea And Vomiting  . Prednisone Other (See Comments)    Reaction:  Elevated BP  . Sulfa Antibiotics Rash   Home medications Prior to Admission medications   Medication Sig Start Date End Date Taking? Authorizing Provider  atorvastatin (LIPITOR) 20 MG tablet Take 20 mg by mouth daily.   Yes [provider]  cholecalciferol (VITAMIN D) 1000 units tablet Take 1,000 Units by mouth daily.   Yes [provider]  diazepam (VALIUM) 5 MG tablet Take 5 mg by mouth every 8 (eight) hours as needed for anxiety (and/or sleep).    Yes [provider]  diltiazem (CARDIZEM) 120 MG tablet Take 120 mg by mouth 2 (two) times daily.   Yes [provider]  fluticasone (FLONASE) 50 MCG/ACT nasal spray Place 2 sprays into both nostrils daily as needed for rhinitis.   Yes [provider]  furosemide (LASIX) 40 MG tablet Take 1 tablet (40 mg  total) by mouth daily. Patient taking differently: Take 40-80 mg by mouth daily as needed for edema.  01/29/16  Yes Nahser, Wonda Cheng, MD  HYDROcodone-acetaminophen (NORCO/VICODIN) 5-325 MG per tablet Take 1 tablet by mouth every 6 (six) hours as needed for moderate pain.    Yes [provider]  hyoscyamine (LEVSIN, ANASPAZ) 0.125 MG tablet Take 0.125 mg by mouth every 4 (four) hours as needed for cramping.    Yes [provider]  ipratropium (ATROVENT) 0.06 % nasal spray Place 2 sprays into both nostrils 3 (three) times daily.   Yes [provider]  lisinopril (PRINIVIL,ZESTRIL) 40 MG tablet Take 20 mg by mouth daily.    Yes [provider]  Multiple Vitamins-Minerals (PRESERVISION/LUTEIN) CAPS Take 1 capsule by mouth 2 (two) times daily.   Yes [provider]  omega-3 acid ethyl esters (LOVAZA) 1 g capsule Take 1 g by mouth daily.   Yes [provider]  ondansetron (ZOFRAN) 4 MG tablet Take 4 mg by mouth every 4 (four) hours as needed for nausea or vomiting.   Yes [provider]  oxybutynin (DITROPAN) 5 MG tablet Take 5-10 mg by mouth 2 (two) times daily. Pt takes one tablet in the morning and two at night.   Yes [provider]  pantoprazole (PROTONIX) 40 MG tablet Take 1 tablet (40 mg total) by mouth 2 (two) times daily. 09/09/16  Yes Armbruster, Renelda Loma, MD  potassium chloride (K-DUR,KLOR-CON) 10 MEQ tablet Take 10 mEq by mouth daily as needed (when taking Lasix).   Yes [provider]  promethazine (PHENERGAN) 12.5 MG tablet Take 1 tablet (12.5 mg total) by mouth every 6 (six) hours as needed for nausea or vomiting. 08/30/14  Yes Ghimire, Henreitta Leber, MD  ranitidine (ZANTAC) 150 MG tablet Take 150 mg by mouth at bedtime as needed for heartburn.    Yes [provider]  warfarin (COUMADIN) 5 MG tablet Take 2.5-5 mg by mouth every evening. Pt takes one-half tablet on Saturday  and one tablet all other days.   Yes  [provider]   Liver Function Tests  Recent Labs Lab 09/29/16 1528  AST 36  ALT 23  ALKPHOS 96  BILITOT 1.3*  PROT 7.4  ALBUMIN 3.6    Recent Labs Lab 09/29/16 1528  LIPASE 30   CBC  Recent Labs Lab 09/29/16 1528 09/29/16 1625  WBC 9.5  --   NEUTROABS 7.3  --   HGB 13.2 14.3  HCT 38.2 42.0  MCV 85.1  --   PLT 235  --    Basic Metabolic Panel  Recent Labs Lab 09/29/16 1528 09/29/16 1625  NA 128* 130*  K 3.7 3.8  CL 96* 95*  CO2 24  --   GLUCOSE 108* 108*  BUN 10 9  CREATININE 0.59 0.50  CALCIUM 8.8*  --      Vitals:   09/29/16 1527 09/29/16 1908 09/29/16 1909 09/29/16 2001  BP:  (!) 166/90 (!) 166/90 (!) 156/86  Pulse:  93 88 88  Resp:  16 14 (!) 21  Temp:      TempSrc:      SpO2:  94% 97% 95%  Weight: 60.3 kg (133 lb)     Height: 5\' 1"  (1.549 m)      Exam: Gen alert, no distress, WDWN No rash, cyanosis or gangrene Sclera anicteric, throat clear  No jvd or bruits Chest clear bilat RRR no MRG Abd soft, with increased BS diffusely, tender LUQ, no rebound, no ascites or HSM GU defer MS no joint effusions or deformity Ext no LE or UE edema / no wounds or ulcers Neuro is alert, Ox 3 , nf   Na 128  K 3.7  BUN 10 Cr 0.59  Ca 8.8  Alb 3.6  LFT's ok   Trop 0.00   WBC 9k  Hb 13   UA negative CT abd/ pelvis > Thickening of the colonic wall, consistent with colitis, considerations include ischemic, infectious, or inflammatory causes. Other various findings not related to admitting complaints, see official reading.    EKG (independ reviewed) > afib, LAD, old ant infarct, no acute changes   Assess: 1  Diarrhea/ abd pain/ nausea - "colitis" by CT, started on IV abx w/ cipro / flagyl , IVF's, hold lasix. Send stool for Cdif.  2  HTN 3  Hx MI sp stent 4  Hx gastritis 5  Chronic back pain - f/b pain clinic on Vicodin 6  Hx diast CHF - hold lasix, gentle IVF's 7  Hx bioprosthetic AVR 8  Chronic afib on coumadin  Plan - as  above     [place] [facility] Pager [phone]   If 7PM-7AM, please contact night-coverage [email or web address] Password [facility] 09/29/2016, 9:21 PM

## 2016-09-30 DIAGNOSIS — Z79891 Long term (current) use of opiate analgesic: Secondary | ICD-10-CM | POA: Diagnosis not present

## 2016-09-30 DIAGNOSIS — N186 End stage renal disease: Secondary | ICD-10-CM | POA: Diagnosis present

## 2016-09-30 DIAGNOSIS — I132 Hypertensive heart and chronic kidney disease with heart failure and with stage 5 chronic kidney disease, or end stage renal disease: Secondary | ICD-10-CM | POA: Diagnosis present

## 2016-09-30 DIAGNOSIS — I252 Old myocardial infarction: Secondary | ICD-10-CM | POA: Diagnosis not present

## 2016-09-30 DIAGNOSIS — K529 Noninfective gastroenteritis and colitis, unspecified: Secondary | ICD-10-CM | POA: Diagnosis not present

## 2016-09-30 DIAGNOSIS — I482 Chronic atrial fibrillation: Secondary | ICD-10-CM | POA: Diagnosis present

## 2016-09-30 DIAGNOSIS — I77811 Abdominal aortic ectasia: Secondary | ICD-10-CM | POA: Diagnosis present

## 2016-09-30 DIAGNOSIS — G8929 Other chronic pain: Secondary | ICD-10-CM | POA: Diagnosis present

## 2016-09-30 DIAGNOSIS — I350 Nonrheumatic aortic (valve) stenosis: Secondary | ICD-10-CM | POA: Diagnosis present

## 2016-09-30 DIAGNOSIS — K2950 Unspecified chronic gastritis without bleeding: Secondary | ICD-10-CM | POA: Diagnosis present

## 2016-09-30 DIAGNOSIS — I272 Pulmonary hypertension, unspecified: Secondary | ICD-10-CM | POA: Diagnosis present

## 2016-09-30 DIAGNOSIS — R1013 Epigastric pain: Secondary | ICD-10-CM | POA: Diagnosis not present

## 2016-09-30 DIAGNOSIS — K5790 Diverticulosis of intestine, part unspecified, without perforation or abscess without bleeding: Secondary | ICD-10-CM | POA: Diagnosis present

## 2016-09-30 DIAGNOSIS — Z952 Presence of prosthetic heart valve: Secondary | ICD-10-CM | POA: Diagnosis not present

## 2016-09-30 DIAGNOSIS — I2510 Atherosclerotic heart disease of native coronary artery without angina pectoris: Secondary | ICD-10-CM | POA: Diagnosis present

## 2016-09-30 DIAGNOSIS — E785 Hyperlipidemia, unspecified: Secondary | ICD-10-CM | POA: Diagnosis present

## 2016-09-30 DIAGNOSIS — Z79899 Other long term (current) drug therapy: Secondary | ICD-10-CM | POA: Diagnosis not present

## 2016-09-30 DIAGNOSIS — H9190 Unspecified hearing loss, unspecified ear: Secondary | ICD-10-CM | POA: Diagnosis present

## 2016-09-30 DIAGNOSIS — Z955 Presence of coronary angioplasty implant and graft: Secondary | ICD-10-CM | POA: Diagnosis not present

## 2016-09-30 DIAGNOSIS — Z953 Presence of xenogenic heart valve: Secondary | ICD-10-CM | POA: Diagnosis not present

## 2016-09-30 DIAGNOSIS — K219 Gastro-esophageal reflux disease without esophagitis: Secondary | ICD-10-CM | POA: Diagnosis present

## 2016-09-30 DIAGNOSIS — I5032 Chronic diastolic (congestive) heart failure: Secondary | ICD-10-CM | POA: Diagnosis present

## 2016-09-30 DIAGNOSIS — Z7901 Long term (current) use of anticoagulants: Secondary | ICD-10-CM | POA: Diagnosis not present

## 2016-09-30 DIAGNOSIS — R1032 Left lower quadrant pain: Secondary | ICD-10-CM | POA: Diagnosis not present

## 2016-09-30 DIAGNOSIS — M549 Dorsalgia, unspecified: Secondary | ICD-10-CM | POA: Diagnosis present

## 2016-09-30 LAB — BASIC METABOLIC PANEL
ANION GAP: 7 (ref 5–15)
BUN: 9 mg/dL (ref 6–20)
CHLORIDE: 101 mmol/L (ref 101–111)
CO2: 26 mmol/L (ref 22–32)
Calcium: 8.7 mg/dL — ABNORMAL LOW (ref 8.9–10.3)
Creatinine, Ser: 0.68 mg/dL (ref 0.44–1.00)
GFR calc non Af Amer: >60 mL/min (ref >60)
Glucose, Bld: 89 mg/dL (ref 65–99)
POTASSIUM: 3.5 mmol/L (ref 3.5–5.1)
Sodium: 134 mmol/L — ABNORMAL LOW (ref 135–145)

## 2016-09-30 LAB — PROTIME-INR
INR: 2
Prothrombin Time: 23 seconds — ABNORMAL HIGH (ref 11.4–15.2)

## 2016-09-30 MED ORDER — WARFARIN SODIUM 3 MG PO TABS
3.0000 mg | ORAL_TABLET | Freq: Once | ORAL | Status: AC
  Administered 2016-09-30: 3 mg via ORAL
  Filled 2016-09-30: qty 1

## 2016-09-30 NOTE — Progress Notes (Signed)
PROGRESS NOTE    Brittany [name]  EZM:[id number] DOB: 11/26/1927 DOA: 09/29/2016 PCP: Mayra Neer, MD     Brief Narrative:  Brittany [name] is a 83 y.o. female with hx of with HTN, diast CHF, afib, bioAVR , chronic gastritis presenting with 2 day history of LUQ abd pain, nausea and diarrhea. She has had poor oral intake, 5-10 stools per day. Last bowel movement was yesterday prior to presentation in the emergency department. In the emergency department, CT scan of the abdomen and pelvis showed mild thickening of the colonic wall consistent with colitis. She was started on IV Cipro and Flagyl.  Assessment & Plan:   Principal Problem:   Colitis Active Problems:   Aortic stenosis   Hypertension   Chronic atrial fibrillation (HCC)   S/P AVR (aortic valve replacement)   Chronic diastolic heart failure (HCC)   Abdominal pain   Diarrhea   Chronic back pain   Colitis -CT abd/pelvis: Thickening of the colonic wall, consistent with colitis. -Last colonoscopy 2006 unremarkable per family report  -Has not had a bowel movement since yesterday prior to presentation in the Emergency department -Abdominal pain has improved but still requiring IV morphine prn  -Continue Cipro and Flagyl -Advance diet to soft diet today   Chronic A Fib   -Coumadin per pharmacy -Continue cardizem   Chronic diastolic CHF -Stable  Chronic back pain -Follows pain clinic on vicodin  CAD, hx MI s/p stent, hx bioprosthetic AVR  HLD -Continue lipitor   HTN -Continue lisinopril   Ectatic abdominal aorta  -At risk for aneurysm development. Recommend followup by ultrasound in 5 years.   DVT prophylaxis: coumadin Code Status: full Family Communication: daughter at bedside Disposition Plan: pending improvement, home when stable   Consultants:   None  Procedures:   None  Antimicrobials:  Anti-infectives    Start     Dose/Rate Route Frequency Ordered Stop   09/30/16 1000  ciprofloxacin  (CIPRO) IVPB 400 mg     400 mg 200 mL/hr over 60 Minutes Intravenous Every 12 hours 09/29/16 2339     09/30/16 0600  metroNIDAZOLE (FLAGYL) IVPB 500 mg     500 mg 100 mL/hr over 60 Minutes Intravenous Every 8 hours 09/29/16 2341     09/29/16 2315  ciprofloxacin (CIPRO) IVPB 400 mg  Status:  Discontinued     400 mg 200 mL/hr over 60 Minutes Intravenous Every 12 hours 09/29/16 2308 09/29/16 2339   09/29/16 2315  metroNIDAZOLE (FLAGYL) IVPB 500 mg  Status:  Discontinued     500 mg 100 mL/hr over 60 Minutes Intravenous Every 8 hours 09/29/16 2308 09/29/16 2340   09/29/16 2045  ciprofloxacin (CIPRO) IVPB 400 mg     400 mg 200 mL/hr over 60 Minutes Intravenous  Once 09/29/16 2036 09/29/16 2344   09/29/16 2045  metroNIDAZOLE (FLAGYL) IVPB 500 mg     500 mg 100 mL/hr over 60 Minutes Intravenous  Once 09/29/16 2036 09/29/16 2246       Subjective: Patient feeling a little bit better. Without morphine, she rates her abdominal pain about 6, which has improved from prior to admission. With morphine, abdominal pain is about a 1. Denying any nausea or vomiting. States that her last loose bowel movement was yesterday. She has an appetite and would like to advance diet.  Objective: Vitals:   09/29/16 2215 09/30/16 0012 09/30/16 0539 09/30/16 0956  BP: (!) 157/77 (!) 180/94 (!) 155/76 (!) 143/76  Pulse: 89 82 67 62  Resp: 19 16 18    Temp:  97.8 F (36.6 C) 97.7 F (36.5 C)   TempSrc:  Oral Oral   SpO2: 98% 99% 99%   Weight:   59.8 kg (131 lb 13.4 oz)   Height:   5\' 1"  (1.549 m)     Intake/Output Summary (Last 24 hours) at 09/30/16 1311 Last data filed at 09/30/16 1003  Gross per 24 hour  Intake             1250 ml  Output                1 ml  Net             1249 ml   Filed Weights   09/29/16 1527 09/30/16 0539  Weight: 60.3 kg (133 lb) 59.8 kg (131 lb 13.4 oz)    Examination:  General exam: Appears calm and comfortable  Respiratory system: Clear to auscultation. Respiratory  effort normal. Cardiovascular system: S1 & S2 heard, RRR. No JVD, murmurs, rubs, gallops or clicks. No pedal edema. Gastrointestinal system: Abdomen is nondistended, soft and mildly TTP LLQ. No organomegaly or masses felt. Normal bowel sounds heard. Central nervous system: Alert and oriented. No focal neurological deficits. Extremities: Symmetric 5 x 5 power. Skin: No rashes, lesions or ulcers Psychiatry: Judgement and insight appear normal. Mood & affect appropriate.   Data Reviewed: I have personally reviewed following labs and imaging studies  CBC:  Recent Labs Lab 09/29/16 1528 09/29/16 1625  WBC 9.5  --   NEUTROABS 7.3  --   HGB 13.2 14.3  HCT 38.2 42.0  MCV 85.1  --   PLT 235  --    Basic Metabolic Panel:  Recent Labs Lab 09/29/16 1528 09/29/16 1625 09/30/16 0420  NA 128* 130* 134*  K 3.7 3.8 3.5  CL 96* 95* 101  CO2 24  --  26  GLUCOSE 108* 108* 89  BUN 10 9 9   CREATININE 0.59 0.50 0.68  CALCIUM 8.8*  --  8.7*   GFR: Estimated Creatinine Clearance: 41.1 mL/min (by C-G formula based on SCr of 0.68 mg/dL). Liver Function Tests:  Recent Labs Lab 09/29/16 1528  AST 36  ALT 23  ALKPHOS 96  BILITOT 1.3*  PROT 7.4  ALBUMIN 3.6    Recent Labs Lab 09/29/16 1528  LIPASE 30   No results for input(s): AMMONIA in the last 168 hours. Coagulation Profile:  Recent Labs Lab 09/29/16 2321 09/30/16 1137  INR 1.70 2.00   Cardiac Enzymes: No results for input(s): CKTOTAL, CKMB, CKMBINDEX, TROPONINI in the last 168 hours. BNP (last 3 results) No results for input(s): PROBNP in the last 8760 hours. HbA1C: No results for input(s): HGBA1C in the last 72 hours. CBG: No results for input(s): GLUCAP in the last 168 hours. Lipid Profile: No results for input(s): CHOL, HDL, LDLCALC, TRIG, CHOLHDL, LDLDIRECT in the last 72 hours. Thyroid Function Tests: No results for input(s): TSH, T4TOTAL, FREET4, T3FREE, THYROIDAB in the last 72 hours. Anemia Panel: No  results for input(s): VITAMINB12, FOLATE, FERRITIN, TIBC, IRON, RETICCTPCT in the last 72 hours. Sepsis Labs: No results for input(s): PROCALCITON, LATICACIDVEN in the last 168 hours.  No results found for this or any previous visit (from the past 240 hour(s)).     Radiology Studies: Ct Abdomen Pelvis W Contrast  Result Date: 09/29/2016 CLINICAL DATA:  83 yo F with a chief complaint of epigastric abdominal pain and diarrhea. Going on for the past couple days. Denies  fevers or chills. Denies chest pain shortness of breath. Denies exertional symptoms. EXAM: CT ABDOMEN AND PELVIS WITH CONTRAST TECHNIQUE: Multidetector CT imaging of the abdomen and pelvis was performed using the standard protocol following bolus administration of intravenous contrast. CONTRAST:  135mL ISOVUE-300 IOPAMIDOL (ISOVUE-300) INJECTION 61% COMPARISON:  CT abdomen and pelvis 02/03/2012 FINDINGS: Lower chest: The heart is mildly enlarged. There is dense mitral annulus calcification. Lung bases are unremarkable. There is lingular atelectasis. Hepatobiliary: Status post cholecystectomy. The liver is normal in appearance. There is mild postsurgical dilatation of the common bile duct, within the limits of normal. Pancreas: Unremarkable. No pancreatic ductal dilatation or surrounding inflammatory changes. Spleen: Normal in size without focal abnormality. Adrenals/Urinary Tract: The adrenal glands are normal in appearance. There is no hydronephrosis. No renal mass. Urinary bladder is normal in appearance. Stomach/Bowel: The stomach is normal in appearance. Small bowel loops are normal in caliber. There is mild thickening of the colon, particularly involving the ascending, transverse, proximal descending colon to a greater degree than the distal colon. There are scattered diverticula but no acute diverticulitis. No evidence for abscess. Status post appendectomy. Vascular/Lymphatic: There is atherosclerotic calcification of the abdominal  aorta. Infrarenal course is ectatic measuring 2.5 cm. There is normal vascular opacification of the celiac axis, superior mesenteric artery, and inferior mesenteric artery. Normal appearance of the portal venous system and inferior vena cava. Reproductive: Status post hysterectomy.  No adnexal mass. Other: No ascites.  Anterior abdominal wall is unremarkable. Musculoskeletal: There is degenerative change in the lumbar spine. IMPRESSION: 1. Thickening of the colonic wall, consistent with colitis. Considerations include ischemic, infectious, or inflammatory causes. 2. Cardiomegaly and coronary artery disease. Mitral annulus calcifications. 3. Lingular atelectasis. 4. Colonic diverticulosis without acute diverticulitis. 5. Aortic atherosclerosis. Ectatic abdominal aorta at risk for aneurysm development. Recommend followup by ultrasound in 5 years. This recommendation follows ACR consensus guidelines: White Paper of the ACR Incidental Findings Committee II on Vascular Findings. J Am Coll Radiol 2013; 10:789-794. 6. Hysterectomy. Electronically Signed   By: Nolon Nations M.D.   On: 09/29/2016 19:47      Scheduled Meds: . atorvastatin  20 mg Oral Daily  . diltiazem  120 mg Oral BID  . lisinopril  20 mg Oral Daily  . pantoprazole  40 mg Oral BID  . warfarin  3 mg Oral ONCE-1800  . Warfarin - Pharmacist Dosing Inpatient   Does not apply q1800   Continuous Infusions: . ciprofloxacin Stopped (09/30/16 0949)  . metronidazole Stopped (09/30/16 1242)     LOS: 0 days    Time spent: 40 minutes   Dessa Phi, DO [facility] [email or web address] Password [email or web address] 09/30/2016, 1:11 PM

## 2016-09-30 NOTE — Progress Notes (Signed)
[facility] for Warfarin Indication: atrial fibrillation  Allergies  Allergen Reactions  . Amlodipine Swelling and Other (See Comments)    Reaction:  Unspecified swelling reaction  . Amoxicillin Diarrhea, Nausea And Vomiting and Other (See Comments)    Has patient had a PCN reaction causing immediate rash, facial/tongue/throat swelling, SOB or lightheadedness with hypotension: No Has patient had a PCN reaction causing severe rash involving mucus membranes or skin necrosis: No Has patient had a PCN reaction that required hospitalization: No Has patient had a PCN reaction occurring within the last 10 years: No If all of the above answers are "NO", then may proceed with Cephalosporin use.  . Carafate [Sucralfate] Swelling and Other (See Comments)    Reaction:  Knee swelling/redness   . Cephalexin Diarrhea  . Codeine Nausea And Vomiting  . Irbesartan Swelling and Other (See Comments)    Reaction:  Facial/hand swelling and numbness   . Morphine And Related Other (See Comments)    Pt states that she has a history of addiction with this medication.      . Neurontin [Gabapentin] Swelling and Other (See Comments)    Reaction:  Leg swelling   . Nitrofurantoin Monohyd Macro Diarrhea and Nausea And Vomiting  . Prednisone Other (See Comments)    Reaction:  Elevated BP  . Sulfa Antibiotics Rash    Patient Measurements: Height: 5\' 1"  (154.9 cm) Weight: 131 lb 13.4 oz (59.8 kg) IBW/kg (Calculated) : 47.8 Heparin Dosing Weight:   Vital Signs: Temp: 97.7 F (36.5 C) (06/22 0539) Temp Source: Oral (06/22 0539) BP: 143/76 (06/22 0956) Pulse Rate: 62 (06/22 0956)  Labs:  Recent Labs  09/29/16 1528 09/29/16 1625 09/29/16 2321 09/30/16 0420 09/30/16 1137  HGB 13.2 14.3  --   --   --   HCT 38.2 42.0  --   --   --   PLT 235  --   --   --   --   LABPROT  --   --  20.2*  --  23.0*  INR  --   --  1.70  --  2.00  CREATININE 0.59 0.50  --  0.68  --      Estimated Creatinine Clearance: 41.1 mL/min (by C-G formula based on SCr of 0.68 mg/dL).   Medical History: Past Medical History:  Diagnosis Date  . Arthritis   . Atrial fibrillation ([place])   . Chronic back pain   . Diverticulosis   . Dyslipidemia    takes Lipitor daily  . Dysrhythmia    Atrial Fibrillation  . Foot drop    SINCE 1987  . GERD (gastroesophageal reflux disease)   . H/O hiatal hernia   . Hearing impaired    Bilateral hearing aids  . History of bacterial endocarditis   . History of gastritis   . History of stomach ulcers    many yrs ago  . History of TIA (transient ischemic attack)    2013-- RESIDUAL PERIPHERAL VISION RIGHT EYE--  RESOLVED  . Hypertension   . IC (interstitial cystitis)   . Pulmonary hypertension ([place]) 10/2013   Based on TTE  . S/P aortic valve replacement    2005  . Urine incontinence     Medications:  Prescriptions Prior to Admission  Medication Sig Dispense Refill Last Dose  . atorvastatin (LIPITOR) 20 MG tablet Take 20 mg by mouth daily.   Past Week at Unknown time  . cholecalciferol (VITAMIN D) 1000 units tablet Take 1,000 Units  by mouth daily.   Past Week at Unknown time  . diazepam (VALIUM) 5 MG tablet Take 5 mg by mouth every 8 (eight) hours as needed for anxiety (and/or sleep).    Past Month at Unknown time  . diltiazem (CARDIZEM) 120 MG tablet Take 120 mg by mouth 2 (two) times daily.   Past Week at Unknown time  . fluticasone (FLONASE) 50 MCG/ACT nasal spray Place 2 sprays into both nostrils daily as needed for rhinitis.   Past Week at Unknown time  . furosemide (LASIX) 40 MG tablet Take 1 tablet (40 mg total) by mouth daily. (Patient taking differently: Take 40-80 mg by mouth daily as needed for edema. ) 90 tablet 3 Past Week at Unknown time  . HYDROcodone-acetaminophen (NORCO/VICODIN) 5-325 MG per tablet Take 1 tablet by mouth every 6 (six) hours as needed for moderate pain.    Past Week at Unknown time  . hyoscyamine (LEVSIN,  ANASPAZ) 0.125 MG tablet Take 0.125 mg by mouth every 4 (four) hours as needed for cramping.    Past Month at Unknown time  . ipratropium (ATROVENT) 0.06 % nasal spray Place 2 sprays into both nostrils 3 (three) times daily.   Past Week at Unknown time  . lisinopril (PRINIVIL,ZESTRIL) 40 MG tablet Take 20 mg by mouth daily.    Past Week at Unknown time  . Multiple Vitamins-Minerals (PRESERVISION/LUTEIN) CAPS Take 1 capsule by mouth 2 (two) times daily.   Past Week at Unknown time  . omega-3 acid ethyl esters (LOVAZA) 1 g capsule Take 1 g by mouth daily.   Past Week at Unknown time  . ondansetron (ZOFRAN) 4 MG tablet Take 4 mg by mouth every 4 (four) hours as needed for nausea or vomiting.   Past Month at Unknown time  . oxybutynin (DITROPAN) 5 MG tablet Take 5-10 mg by mouth 2 (two) times daily. Pt takes one tablet in the morning and two at night.   Past Week at Unknown time  . pantoprazole (PROTONIX) 40 MG tablet Take 1 tablet (40 mg total) by mouth 2 (two) times daily. 180 tablet 3 Past Week at Unknown time  . potassium chloride (K-DUR,KLOR-CON) 10 MEQ tablet Take 10 mEq by mouth daily as needed (when taking Lasix).   Past Week at Unknown time  . promethazine (PHENERGAN) 12.5 MG tablet Take 1 tablet (12.5 mg total) by mouth every 6 (six) hours as needed for nausea or vomiting. 30 tablet 0 Past Month at Unknown time  . ranitidine (ZANTAC) 150 MG tablet Take 150 mg by mouth at bedtime as needed for heartburn.    Past Week at Unknown time  . warfarin (COUMADIN) 5 MG tablet Take 2.5-5 mg by mouth every evening. Pt takes one-half tablet on Saturday and one tablet all other days.   09/28/2016 at 1800   Scheduled:  . atorvastatin  20 mg Oral Daily  . diltiazem  120 mg Oral BID  . lisinopril  20 mg Oral Daily  . pantoprazole  40 mg Oral BID  . [START ON 10/01/2016] warfarin  2.5 mg Oral Q Sat-1800  . warfarin  5 mg Oral Once per day on Sun Mon Tue Wed Thu Fri  . Warfarin - Pharmacist Dosing Inpatient    Does not apply q1800    Assessment: Patient with afib and currently on warfarin was admitted with abdominal pain, nausea, and diarrhea.  INR subtherapeutic on admit at 1.7.  Last PTA dose noted 6/20 on medication history, but patient refused  6/21 dose saying they already took it at home.  PTA dose: 5mg /day except 2.5mg  on Saturdays   Today, INR is therapeutic at 2.0, an increase from yesterday evening.   Drug interactions noted with ciprofloxacin and metronidazole, both of which can increase INR. Nausea and diarrhea can also lead to increased INR.  Will give slightly decreased dose tonight with anticipation of INR continuing to increase.   Goal of Therapy:  INR 2-3 Monitor platelets by anticoagulation protocol: Yes   Plan:  Warfarin 3mg  PO x1 tonight  Daily INRs Monitor signs/symptoms of bleeding Monitor for drug interactions   Demetrius Charity, PharmD Acute Care Pharmacy Resident  Pager: [phone] 09/30/2016

## 2016-09-30 NOTE — Progress Notes (Signed)
ANTICOAGULATION CONSULT NOTE - Initial Consult  Pharmacy Consult for Warfarin Indication: atrial fibrillation  Allergies  Allergen Reactions  . Amlodipine Swelling and Other (See Comments)    Reaction:  Unspecified swelling reaction  . Amoxicillin Diarrhea, Nausea And Vomiting and Other (See Comments)    Has patient had a PCN reaction causing immediate rash, facial/tongue/throat swelling, SOB or lightheadedness with hypotension: No Has patient had a PCN reaction causing severe rash involving mucus membranes or skin necrosis: No Has patient had a PCN reaction that required hospitalization: No Has patient had a PCN reaction occurring within the last 10 years: No If all of the above answers are "NO", then may proceed with Cephalosporin use.  . Carafate [Sucralfate] Swelling and Other (See Comments)    Reaction:  Knee swelling/redness   . Cephalexin Diarrhea  . Codeine Nausea And Vomiting  . Irbesartan Swelling and Other (See Comments)    Reaction:  Facial/hand swelling and numbness   . Morphine And Related Other (See Comments)    Pt states that she has a history of addiction with this medication.      . Neurontin [Gabapentin] Swelling and Other (See Comments)    Reaction:  Leg swelling   . Nitrofurantoin Monohyd Macro Diarrhea and Nausea And Vomiting  . Prednisone Other (See Comments)    Reaction:  Elevated BP  . Sulfa Antibiotics Rash    Patient Measurements: Height: 5\' 1"  (154.9 cm) Weight: 133 lb (60.3 kg) IBW/kg (Calculated) : 47.8 Heparin Dosing Weight:   Vital Signs: Temp: 97.8 F (36.6 C) (06/22 0012) Temp Source: Oral (06/22 0012) BP: 180/94 (06/22 0012) Pulse Rate: 82 (06/22 0012)  Labs:  Recent Labs  09/29/16 1528 09/29/16 1625 09/29/16 2321  HGB 13.2 14.3  --   HCT 38.2 42.0  --   PLT 235  --   --   LABPROT  --   --  20.2*  INR  --   --  1.70  CREATININE 0.59 0.50  --     Estimated Creatinine Clearance: 41.3 mL/min (by C-G formula based on SCr of 0.5  mg/dL).   Medical History: Past Medical History:  Diagnosis Date  . Arthritis   . Atrial fibrillation ([place])   . Chronic back pain   . Diverticulosis   . Dyslipidemia    takes Lipitor daily  . Dysrhythmia    Atrial Fibrillation  . Foot drop    SINCE 1987  . GERD (gastroesophageal reflux disease)   . H/O hiatal hernia   . Hearing impaired    Bilateral hearing aids  . History of bacterial endocarditis   . History of gastritis   . History of stomach ulcers    many yrs ago  . History of TIA (transient ischemic attack)    2013-- RESIDUAL PERIPHERAL VISION RIGHT EYE--  RESOLVED  . Hypertension   . IC (interstitial cystitis)   . Pulmonary hypertension ([place]) 10/2013   Based on TTE  . S/P aortic valve replacement    2005  . Urine incontinence     Medications:  Prescriptions Prior to Admission  Medication Sig Dispense Refill Last Dose  . atorvastatin (LIPITOR) 20 MG tablet Take 20 mg by mouth daily.   Past Week at Unknown time  . cholecalciferol (VITAMIN D) 1000 units tablet Take 1,000 Units by mouth daily.   Past Week at Unknown time  . diazepam (VALIUM) 5 MG tablet Take 5 mg by mouth every 8 (eight) hours as needed for anxiety (and/or sleep).  Past Month at Unknown time  . diltiazem (CARDIZEM) 120 MG tablet Take 120 mg by mouth 2 (two) times daily.   Past Week at Unknown time  . fluticasone (FLONASE) 50 MCG/ACT nasal spray Place 2 sprays into both nostrils daily as needed for rhinitis.   Past Week at Unknown time  . furosemide (LASIX) 40 MG tablet Take 1 tablet (40 mg total) by mouth daily. (Patient taking differently: Take 40-80 mg by mouth daily as needed for edema. ) 90 tablet 3 Past Week at Unknown time  . HYDROcodone-acetaminophen (NORCO/VICODIN) 5-325 MG per tablet Take 1 tablet by mouth every 6 (six) hours as needed for moderate pain.    Past Week at Unknown time  . hyoscyamine (LEVSIN, ANASPAZ) 0.125 MG tablet Take 0.125 mg by mouth every 4 (four) hours as needed for  cramping.    Past Month at Unknown time  . ipratropium (ATROVENT) 0.06 % nasal spray Place 2 sprays into both nostrils 3 (three) times daily.   Past Week at Unknown time  . lisinopril (PRINIVIL,ZESTRIL) 40 MG tablet Take 20 mg by mouth daily.    Past Week at Unknown time  . Multiple Vitamins-Minerals (PRESERVISION/LUTEIN) CAPS Take 1 capsule by mouth 2 (two) times daily.   Past Week at Unknown time  . omega-3 acid ethyl esters (LOVAZA) 1 g capsule Take 1 g by mouth daily.   Past Week at Unknown time  . ondansetron (ZOFRAN) 4 MG tablet Take 4 mg by mouth every 4 (four) hours as needed for nausea or vomiting.   Past Month at Unknown time  . oxybutynin (DITROPAN) 5 MG tablet Take 5-10 mg by mouth 2 (two) times daily. Pt takes one tablet in the morning and two at night.   Past Week at Unknown time  . pantoprazole (PROTONIX) 40 MG tablet Take 1 tablet (40 mg total) by mouth 2 (two) times daily. 180 tablet 3 Past Week at Unknown time  . potassium chloride (K-DUR,KLOR-CON) 10 MEQ tablet Take 10 mEq by mouth daily as needed (when taking Lasix).   Past Week at Unknown time  . promethazine (PHENERGAN) 12.5 MG tablet Take 1 tablet (12.5 mg total) by mouth every 6 (six) hours as needed for nausea or vomiting. 30 tablet 0 Past Month at Unknown time  . ranitidine (ZANTAC) 150 MG tablet Take 150 mg by mouth at bedtime as needed for heartburn.    Past Week at Unknown time  . warfarin (COUMADIN) 5 MG tablet Take 2.5-5 mg by mouth every evening. Pt takes one-half tablet on Saturday and one tablet all other days.   09/28/2016 at 1800   Scheduled:  . atorvastatin  20 mg Oral Daily  . diltiazem  120 mg Oral BID  . iopamidol      . lisinopril  20 mg Oral Daily  . pantoprazole  40 mg Oral BID  . [START ON 10/01/2016] warfarin  2.5 mg Oral Q Sat-1800  . warfarin  5 mg Oral Once per day on Sun Mon Tue Wed Thu Fri  . Warfarin - Pharmacist Dosing Inpatient   Does not apply q1800    Assessment: Patient with afib and  currently on warfarin.  INR on admit 1.7.  Last dose noted 6/20 at 1800.    Goal of Therapy:  INR 2-3 Monitor platelets by anticoagulation protocol: Yes   Plan:  Continue home warfarin dosing with dose starting now for missing 6/21 dose. Daily INR  Nani Skillern Crowford 09/30/2016,4:48 AM

## 2016-10-01 ENCOUNTER — Encounter (HOSPITAL_COMMUNITY): Payer: Self-pay | Admitting: Radiology

## 2016-10-01 ENCOUNTER — Inpatient Hospital Stay (HOSPITAL_COMMUNITY): Payer: Medicare Other

## 2016-10-01 LAB — CBC WITH DIFFERENTIAL/PLATELET
Basophils Absolute: 0 10*3/uL (ref 0.0–0.1)
Basophils Relative: 1 %
Eosinophils Absolute: 0.2 10*3/uL (ref 0.0–0.7)
Eosinophils Relative: 3 %
HEMATOCRIT: 31.2 % — AB (ref 36.0–46.0)
HEMOGLOBIN: 10.6 g/dL — AB (ref 12.0–15.0)
LYMPHS ABS: 2.2 10*3/uL (ref 0.7–4.0)
LYMPHS PCT: 29 %
MCH: 29.4 pg (ref 26.0–34.0)
MCHC: 34 g/dL (ref 30.0–36.0)
MCV: 86.7 fL (ref 78.0–100.0)
MONOS PCT: 17 %
Monocytes Absolute: 1.3 10*3/uL — ABNORMAL HIGH (ref 0.1–1.0)
NEUTROS PCT: 50 %
Neutro Abs: 3.8 10*3/uL (ref 1.7–7.7)
Platelets: 179 10*3/uL (ref 150–400)
RBC: 3.6 MIL/uL — AB (ref 3.87–5.11)
RDW: 13 % (ref 11.5–15.5)
WBC: 7.6 10*3/uL (ref 4.0–10.5)

## 2016-10-01 LAB — BASIC METABOLIC PANEL
Anion gap: 7 (ref 5–15)
BUN: 11 mg/dL (ref 6–20)
CHLORIDE: 99 mmol/L — AB (ref 101–111)
CO2: 25 mmol/L (ref 22–32)
Calcium: 8.3 mg/dL — ABNORMAL LOW (ref 8.9–10.3)
Creatinine, Ser: 0.87 mg/dL (ref 0.44–1.00)
GFR calc Af Amer: >60 mL/min (ref >60)
GFR calc non Af Amer: 58 mL/min — ABNORMAL LOW (ref >60)
GLUCOSE: 93 mg/dL (ref 65–99)
POTASSIUM: 3.9 mmol/L (ref 3.5–5.1)
Sodium: 131 mmol/L — ABNORMAL LOW (ref 135–145)

## 2016-10-01 LAB — PROTIME-INR
INR: 2.21
Prothrombin Time: 24.9 seconds — ABNORMAL HIGH (ref 11.4–15.2)

## 2016-10-01 LAB — MAGNESIUM: MAGNESIUM: 1.5 mg/dL — AB (ref 1.7–2.4)

## 2016-10-01 MED ORDER — ALUM & MAG HYDROXIDE-SIMETH 200-200-20 MG/5ML PO SUSP
30.0000 mL | Freq: Once | ORAL | Status: AC
  Administered 2016-10-01: 30 mL via ORAL
  Filled 2016-10-01: qty 30

## 2016-10-01 MED ORDER — WARFARIN SODIUM 2.5 MG PO TABS
2.5000 mg | ORAL_TABLET | Freq: Once | ORAL | Status: AC
  Administered 2016-10-01: 2.5 mg via ORAL
  Filled 2016-10-01: qty 1

## 2016-10-01 MED ORDER — IOPAMIDOL (ISOVUE-370) INJECTION 76%
100.0000 mL | Freq: Once | INTRAVENOUS | Status: AC | PRN
  Administered 2016-10-01: 100 mL via INTRAVENOUS

## 2016-10-01 MED ORDER — IOPAMIDOL (ISOVUE-370) INJECTION 76%
INTRAVENOUS | Status: AC
  Filled 2016-10-01: qty 100

## 2016-10-01 MED ORDER — MAGNESIUM SULFATE 2 GM/50ML IV SOLN
2.0000 g | Freq: Once | INTRAVENOUS | Status: AC
  Administered 2016-10-01: 2 g via INTRAVENOUS
  Filled 2016-10-01: qty 50

## 2016-10-01 MED ORDER — DIPHENHYDRAMINE HCL 25 MG PO CAPS
25.0000 mg | ORAL_CAPSULE | Freq: Four times a day (QID) | ORAL | Status: AC | PRN
  Administered 2016-10-01 (×3): 25 mg via ORAL
  Filled 2016-10-01 (×3): qty 1

## 2016-10-01 NOTE — Progress Notes (Signed)
PROGRESS NOTE    Brittany [name]  MWU:[id number] DOB: [birth year] DOA: 09/29/2016 PCP: Mayra Neer, MD     Brief Narrative:  Brittany [name] is a 83 y.o. female with hx of with HTN, diast CHF, afib, bioAVR , chronic gastritis presenting with 2 day history of LUQ abd pain, nausea and diarrhea. She has had poor oral intake, 5-10 stools per day. Last bowel movement was yesterday prior to presentation in the emergency department. In the emergency department, CT scan of the abdomen and pelvis showed mild thickening of the colonic wall consistent with colitis. She was started on IV Cipro and Flagyl.  Assessment & Plan:   Principal Problem:   Colitis Active Problems:   Aortic stenosis   Hypertension   Chronic atrial fibrillation ([facility])   S/P AVR (aortic valve replacement)   Chronic diastolic heart failure ([facility])   Abdominal pain   Diarrhea   Chronic back pain   Colitis -CT abd/pelvis: Thickening of the colonic wall, consistent with colitis. -Last colonoscopy 2006 unremarkable per family report  -Worsened today, described left abdominal pain 10/10  -Continue cipro/flagyl  -CTA abd/pelvis to r/o ischemia   Chronic A Fib   -Coumadin per pharmacy -Continue cardizem   Hypomagnesemia -Replace  Chronic diastolic CHF -Stable  Chronic back pain -Follows pain clinic on vicodin  CAD, hx MI s/p stent, hx bioprosthetic AVR  HLD -Continue lipitor   HTN -Continue lisinopril   Ectatic abdominal aorta  -At risk for aneurysm development. Recommend followup by ultrasound in 5 years.   DVT prophylaxis: coumadin Code Status: full Family Communication: No family at bedside, patient declined me to update family today. Disposition Plan: pending improvement, home when stable   Consultants:   None  Procedures:   None  Antimicrobials:  Anti-infectives    Start     Dose/Rate Route Frequency Ordered Stop   09/30/16 1000  ciprofloxacin (CIPRO) IVPB 400 mg     400 mg 200 mL/hr  over 60 Minutes Intravenous Every 12 hours 09/29/16 2339     09/30/16 0600  metroNIDAZOLE (FLAGYL) IVPB 500 mg     500 mg 100 mL/hr over 60 Minutes Intravenous Every 8 hours 09/29/16 2341     09/29/16 2315  ciprofloxacin (CIPRO) IVPB 400 mg  Status:  Discontinued     400 mg 200 mL/hr over 60 Minutes Intravenous Every 12 hours 09/29/16 2308 09/29/16 2339   09/29/16 2315  metroNIDAZOLE (FLAGYL) IVPB 500 mg  Status:  Discontinued     500 mg 100 mL/hr over 60 Minutes Intravenous Every 8 hours 09/29/16 2308 09/29/16 2340   09/29/16 2045  ciprofloxacin (CIPRO) IVPB 400 mg     400 mg 200 mL/hr over 60 Minutes Intravenous  Once 09/29/16 2036 09/29/16 2344   09/29/16 2045  metroNIDAZOLE (FLAGYL) IVPB 500 mg     500 mg 100 mL/hr over 60 Minutes Intravenous  Once 09/29/16 2036 09/29/16 2246       Subjective: Patient states that she has 10 out of 10 left abdominal pain. It had been better yesterday, but worse this morning. She has been tolerating diet, no vomiting and no further bowel movements.  Objective: Vitals:   09/30/16 1544 09/30/16 2234 10/01/16 0537 10/01/16 1004  BP: (!) 102/42 (!) 101/54 (!) 118/51 (!) 122/53  Pulse: (!) 58 65 (!) 58 (!) 55  Resp: 17 16 16    Temp: 97.6 F (36.4 C) 98.4 F (36.9 C) 97.9 F (36.6 C)   TempSrc: Oral Oral Oral  SpO2: 97% 96% 95%   Weight:   59.8 kg (131 lb 13.4 oz)   Height:        Intake/Output Summary (Last 24 hours) at 10/01/16 1235 Last data filed at 10/01/16 1210  Gross per 24 hour  Intake              790 ml  Output              650 ml  Net              140 ml   Filed Weights   09/29/16 1527 09/30/16 0539 10/01/16 0537  Weight: 60.3 kg (133 lb) 59.8 kg (131 lb 13.4 oz) 59.8 kg (131 lb 13.4 oz)    Examination:  General exam: Appears calm and comfortable  Respiratory system: Clear to auscultation. Respiratory effort normal. Cardiovascular system: S1 & S2 heard, RRR. No JVD, murmurs, rubs, gallops or clicks. No pedal  edema. Gastrointestinal system: Abdomen is nondistended, soft, TTP LLQ more than yesterday's exam. No organomegaly or masses felt. Normal bowel sounds heard. Central nervous system: Alert and oriented. No focal neurological deficits. Extremities: Symmetric 5 x 5 power. Skin: No rashes, lesions or ulcers Psychiatry: Judgement and insight appear normal. Mood & affect appropriate.   Data Reviewed: I have personally reviewed following labs and imaging studies  CBC:  Recent Labs Lab 09/29/16 1528 09/29/16 1625 10/01/16 0413  WBC 9.5  --  7.6  NEUTROABS 7.3  --  3.8  HGB 13.2 14.3 10.6*  HCT 38.2 42.0 31.2*  MCV 85.1  --  86.7  PLT 235  --  536   Basic Metabolic Panel:  Recent Labs Lab 09/29/16 1528 09/29/16 1625 09/30/16 0420 10/01/16 0413  NA 128* 130* 134* 131*  K 3.7 3.8 3.5 3.9  CL 96* 95* 101 99*  CO2 24  --  26 25  GLUCOSE 108* 108* 89 93  BUN 10 9 9 11   CREATININE 0.59 0.50 0.68 0.87  CALCIUM 8.8*  --  8.7* 8.3*  MG  --   --   --  1.5*   GFR: Estimated Creatinine Clearance: 37.8 mL/min (by C-G formula based on SCr of 0.87 mg/dL). Liver Function Tests:  Recent Labs Lab 09/29/16 1528  AST 36  ALT 23  ALKPHOS 96  BILITOT 1.3*  PROT 7.4  ALBUMIN 3.6    Recent Labs Lab 09/29/16 1528  LIPASE 30   No results for input(s): AMMONIA in the last 168 hours. Coagulation Profile:  Recent Labs Lab 09/29/16 2321 09/30/16 1137 10/01/16 0413  INR 1.70 2.00 2.21   Cardiac Enzymes: No results for input(s): CKTOTAL, CKMB, CKMBINDEX, TROPONINI in the last 168 hours. BNP (last 3 results) No results for input(s): PROBNP in the last 8760 hours. HbA1C: No results for input(s): HGBA1C in the last 72 hours. CBG: No results for input(s): GLUCAP in the last 168 hours. Lipid Profile: No results for input(s): CHOL, HDL, LDLCALC, TRIG, CHOLHDL, LDLDIRECT in the last 72 hours. Thyroid Function Tests: No results for input(s): TSH, T4TOTAL, FREET4, T3FREE, THYROIDAB in  the last 72 hours. Anemia Panel: No results for input(s): VITAMINB12, FOLATE, FERRITIN, TIBC, IRON, RETICCTPCT in the last 72 hours. Sepsis Labs: No results for input(s): PROCALCITON, LATICACIDVEN in the last 168 hours.  No results found for this or any previous visit (from the past 240 hour(s)).     Radiology Studies: Ct Abdomen Pelvis W Contrast  Result Date: 09/29/2016 CLINICAL DATA:  83 yo F with a  chief complaint of epigastric abdominal pain and diarrhea. Going on for the past couple days. Denies fevers or chills. Denies chest pain shortness of breath. Denies exertional symptoms. EXAM: CT ABDOMEN AND PELVIS WITH CONTRAST TECHNIQUE: Multidetector CT imaging of the abdomen and pelvis was performed using the standard protocol following bolus administration of intravenous contrast. CONTRAST:  183mL ISOVUE-300 IOPAMIDOL (ISOVUE-300) INJECTION 61% COMPARISON:  CT abdomen and pelvis 02/03/2012 FINDINGS: Lower chest: The heart is mildly enlarged. There is dense mitral annulus calcification. Lung bases are unremarkable. There is lingular atelectasis. Hepatobiliary: Status post cholecystectomy. The liver is normal in appearance. There is mild postsurgical dilatation of the common bile duct, within the limits of normal. Pancreas: Unremarkable. No pancreatic ductal dilatation or surrounding inflammatory changes. Spleen: Normal in size without focal abnormality. Adrenals/Urinary Tract: The adrenal glands are normal in appearance. There is no hydronephrosis. No renal mass. Urinary bladder is normal in appearance. Stomach/Bowel: The stomach is normal in appearance. Small bowel loops are normal in caliber. There is mild thickening of the colon, particularly involving the ascending, transverse, proximal descending colon to a greater degree than the distal colon. There are scattered diverticula but no acute diverticulitis. No evidence for abscess. Status post appendectomy. Vascular/Lymphatic: There is  atherosclerotic calcification of the abdominal aorta. Infrarenal course is ectatic measuring 2.5 cm. There is normal vascular opacification of the celiac axis, superior mesenteric artery, and inferior mesenteric artery. Normal appearance of the portal venous system and inferior vena cava. Reproductive: Status post hysterectomy.  No adnexal mass. Other: No ascites.  Anterior abdominal wall is unremarkable. Musculoskeletal: There is degenerative change in the lumbar spine. IMPRESSION: 1. Thickening of the colonic wall, consistent with colitis. Considerations include ischemic, infectious, or inflammatory causes. 2. Cardiomegaly and coronary artery disease. Mitral annulus calcifications. 3. Lingular atelectasis. 4. Colonic diverticulosis without acute diverticulitis. 5. Aortic atherosclerosis. Ectatic abdominal aorta at risk for aneurysm development. Recommend followup by ultrasound in 5 years. This recommendation follows ACR consensus guidelines: White Paper of the ACR Incidental Findings Committee II on Vascular Findings. J Am Coll Radiol 2013; 10:789-794. 6. Hysterectomy. Electronically Signed   By: Nolon Nations M.D.   On: 09/29/2016 19:47      Scheduled Meds: . atorvastatin  20 mg Oral Daily  . diltiazem  120 mg Oral BID  . lisinopril  20 mg Oral Daily  . pantoprazole  40 mg Oral BID  . warfarin  2.5 mg Oral ONCE-1800  . Warfarin - Pharmacist Dosing Inpatient   Does not apply q1800   Continuous Infusions: . ciprofloxacin Stopped (10/01/16 1103)  . magnesium sulfate 1 - 4 g bolus IVPB    . metronidazole Stopped (10/01/16 0748)     LOS: 1 day    Time spent: 30 minutes   Dessa Phi, DO [facility] [email or web address] Password [facility] 10/01/2016, 12:35 PM

## 2016-10-01 NOTE — Progress Notes (Signed)
[facility] for Warfarin Indication: atrial fibrillation  Allergies  Allergen Reactions  . Amlodipine Swelling and Other (See Comments)    Reaction:  Unspecified swelling reaction  . Amoxicillin Diarrhea, Nausea And Vomiting and Other (See Comments)    Has patient had a PCN reaction causing immediate rash, facial/tongue/throat swelling, SOB or lightheadedness with hypotension: No Has patient had a PCN reaction causing severe rash involving mucus membranes or skin necrosis: No Has patient had a PCN reaction that required hospitalization: No Has patient had a PCN reaction occurring within the last 10 years: No If all of the above answers are "NO", then may proceed with Cephalosporin use.  . Carafate [Sucralfate] Swelling and Other (See Comments)    Reaction:  Knee swelling/redness   . Cephalexin Diarrhea  . Codeine Nausea And Vomiting  . Irbesartan Swelling and Other (See Comments)    Reaction:  Facial/hand swelling and numbness   . Morphine And Related Other (See Comments)    Pt states that she has a history of addiction with this medication.      . Neurontin [Gabapentin] Swelling and Other (See Comments)    Reaction:  Leg swelling   . Nitrofurantoin Monohyd Macro Diarrhea and Nausea And Vomiting  . Prednisone Other (See Comments)    Reaction:  Elevated BP  . Sulfa Antibiotics Rash   Vital Signs: Temp: 97.9 F (36.6 C) (06/23 0537) Temp Source: Oral (06/23 0537) BP: 122/53 (06/23 1004) Pulse Rate: 55 (06/23 1004)  Labs:  Recent Labs  09/29/16 1528 09/29/16 1625 09/29/16 2321 09/30/16 0420 09/30/16 1137 10/01/16 0413  HGB 13.2 14.3  --   --   --  10.6*  HCT 38.2 42.0  --   --   --  31.2*  PLT 235  --   --   --   --  179  LABPROT  --   --  20.2*  --  23.0* 24.9*  INR  --   --  1.70  --  2.00 2.21  CREATININE 0.59 0.50  --  0.68  --  0.87    Estimated Creatinine Clearance: 37.8 mL/min (by C-G formula based on SCr of 0.87  mg/dL).   Assessment: Patient with afib and currently on warfarin was admitted with abdominal pain, nausea, and diarrhea.  INR subtherapeutic on admit at 1.7.  Last PTA dose noted 6/20 on medication history, but patient refused 6/21 dose saying she already took it at home.  PTA dose: 5mg /day except 2.5mg  on Saturdays   Today, INR is therapeutic at 2.21   Drug interactions noted with ciprofloxacin and metronidazole, both of which can increase INR. Nausea and diarrhea can also lead to increased INR.  Goal of Therapy:  INR 2-3 Monitor platelets by anticoagulation protocol: Yes   Plan:  Warfarin 2.5 mg PO x1 tonight  Daily INRs Monitor signs/symptoms of bleeding Monitor for drug interactions   Eudelia Bunch, Pharm.D. [phone] 10/01/2016 10:50 AM

## 2016-10-02 LAB — BASIC METABOLIC PANEL
Anion gap: 7 (ref 5–15)
BUN: 10 mg/dL (ref 6–20)
CHLORIDE: 102 mmol/L (ref 101–111)
CO2: 24 mmol/L (ref 22–32)
CREATININE: 0.74 mg/dL (ref 0.44–1.00)
Calcium: 8.3 mg/dL — ABNORMAL LOW (ref 8.9–10.3)
GFR calc Af Amer: >60 mL/min (ref >60)
GLUCOSE: 93 mg/dL (ref 65–99)
POTASSIUM: 4 mmol/L (ref 3.5–5.1)
SODIUM: 133 mmol/L — AB (ref 135–145)

## 2016-10-02 LAB — CBC WITH DIFFERENTIAL/PLATELET
Basophils Absolute: 0 10*3/uL (ref 0.0–0.1)
Basophils Relative: 0 %
EOS ABS: 0.3 10*3/uL (ref 0.0–0.7)
EOS PCT: 3 %
HCT: 30.3 % — ABNORMAL LOW (ref 36.0–46.0)
Hemoglobin: 10.2 g/dL — ABNORMAL LOW (ref 12.0–15.0)
LYMPHS ABS: 1.8 10*3/uL (ref 0.7–4.0)
Lymphocytes Relative: 22 %
MCH: 28.8 pg (ref 26.0–34.0)
MCHC: 33.7 g/dL (ref 30.0–36.0)
MCV: 85.6 fL (ref 78.0–100.0)
MONOS PCT: 17 %
Monocytes Absolute: 1.4 10*3/uL — ABNORMAL HIGH (ref 0.1–1.0)
Neutro Abs: 4.7 10*3/uL (ref 1.7–7.7)
Neutrophils Relative %: 58 %
PLATELETS: 171 10*3/uL (ref 150–400)
RBC: 3.54 MIL/uL — ABNORMAL LOW (ref 3.87–5.11)
RDW: 13 % (ref 11.5–15.5)
WBC: 8.1 10*3/uL (ref 4.0–10.5)

## 2016-10-02 LAB — PROTIME-INR
INR: 2.02
Prothrombin Time: 23.2 seconds — ABNORMAL HIGH (ref 11.4–15.2)

## 2016-10-02 MED ORDER — WARFARIN SODIUM 3 MG PO TABS
3.0000 mg | ORAL_TABLET | Freq: Once | ORAL | Status: DC
  Filled 2016-10-02 (×2): qty 1

## 2016-10-02 MED ORDER — METRONIDAZOLE 500 MG PO TABS: 500.0000 mg | ORAL_TABLET | Freq: Three times a day (TID) | ORAL | 0 refills | Status: AC

## 2016-10-02 MED ORDER — CIPROFLOXACIN HCL 500 MG PO TABS: 500.0000 mg | ORAL_TABLET | Freq: Two times a day (BID) | ORAL | 0 refills | Status: AC

## 2016-10-02 NOTE — Progress Notes (Signed)
Pt denies any pain after eating breakfast.

## 2016-10-02 NOTE — Progress Notes (Signed)
Discharge instructions explained to pt. Prescriptions called in to pharmacy.Daughter to take pt home with her for a couple days. Pt given coumadin 3 mg to take at home tonight. She wouldn't take it early and only has 5 mg doses at home. Discharged via wheelchair.

## 2016-10-02 NOTE — Discharge Instructions (Signed)
Colitis Colitis is inflammation of the colon. Colitis may last a short time (acute) or it may last a long time (chronic). What are the causes? This condition may be caused by:  Viruses.  Bacteria.  Reactions to medicine.  Certain autoimmune diseases, such as Crohn disease or ulcerative colitis.  What are the signs or symptoms? Symptoms of this condition include:  Diarrhea.  Passing bloody or tarry stool.  Pain.  Fever.  Vomiting.  Tiredness (fatigue).  Weight loss.  Bloating.  Sudden increase in abdominal pain.  Having fewer bowel movements than usual.  How is this diagnosed? This condition is diagnosed with a stool test or a blood test. You may also have other tests, including X-rays, a CT scan, or a colonoscopy. How is this treated? Treatment may include:  Resting the bowel. This involves not eating or drinking for a period of time.  Fluids that are given through an IV tube.  Medicine for pain and diarrhea.  Antibiotic medicines.  Cortisone medicines.  Surgery.  Follow these instructions at home: Eating and drinking  Follow instructions from your health care provider about eating or drinking restrictions.  Drink enough fluid to keep your urine clear or pale yellow.  Work with a dietitian to determine which foods cause your condition to flare up.  Avoid foods that cause flare-ups.  Eat a well-balanced diet. Medicines  Take over-the-counter and prescription medicines only as told by your health care provider.  If you were prescribed an antibiotic medicine, take it as told by your health care provider. Do not stop taking the antibiotic even if you start to feel better. General instructions  Keep all follow-up visits as told by your health care provider. This is important. Contact a health care provider if:  Your symptoms do not go away.  You develop new symptoms. Get help right away if:  You have a fever that does not go away with  treatment.  You develop chills.  You have extreme weakness, fainting, or dehydration.  You have repeated vomiting.  You develop severe pain in your abdomen.  You pass bloody or tarry stool. This information is not intended to replace advice given to you by your health care provider. Make sure you discuss any questions you have with your health care provider. Document Released: 05/05/2004 Document Revised: 09/03/2015 Document Reviewed: 07/21/2014 Elsevier Interactive Patient Education  2018 [employer]  

## 2016-10-02 NOTE — Progress Notes (Signed)
[place] for Warfarin Indication: atrial fibrillation  Allergies  Allergen Reactions  . Amlodipine Swelling and Other (See Comments)    Reaction:  Unspecified swelling reaction  . Amoxicillin Diarrhea, Nausea And Vomiting and Other (See Comments)    Has patient had a PCN reaction causing immediate rash, facial/tongue/throat swelling, SOB or lightheadedness with hypotension: No Has patient had a PCN reaction causing severe rash involving mucus membranes or skin necrosis: No Has patient had a PCN reaction that required hospitalization: No Has patient had a PCN reaction occurring within the last 10 years: No If all of the above answers are "NO", then may proceed with Cephalosporin use.  . Carafate [Sucralfate] Swelling and Other (See Comments)    Reaction:  Knee swelling/redness   . Cephalexin Diarrhea  . Codeine Nausea And Vomiting  . Irbesartan Swelling and Other (See Comments)    Reaction:  Facial/hand swelling and numbness   . Morphine And Related Other (See Comments)    Pt states that she has a history of addiction with this medication.      . Neurontin [Gabapentin] Swelling and Other (See Comments)    Reaction:  Leg swelling   . Nitrofurantoin Monohyd Macro Diarrhea and Nausea And Vomiting  . Prednisone Other (See Comments)    Reaction:  Elevated BP  . Sulfa Antibiotics Rash   Vital Signs: Temp: 97.5 F (36.4 C) (06/24 0551) Temp Source: Oral (06/24 0551) BP: 131/53 (06/24 0551) Pulse Rate: 59 (06/24 0551)  Labs:  Recent Labs  09/29/16 1528 09/29/16 1625  09/30/16 0420 09/30/16 1137 10/01/16 0413 10/02/16 0424  HGB 13.2 14.3  --   --   --  10.6* 10.2*  HCT 38.2 42.0  --   --   --  31.2* 30.3*  PLT 235  --   --   --   --  179 171  LABPROT  --   --   < >  --  23.0* 24.9* 23.2*  INR  --   --   < >  --  2.00 2.21 2.02  CREATININE 0.59 0.50  --  0.68  --  0.87 0.74  < > = values in this interval not displayed.  Estimated  Creatinine Clearance: 41.4 mL/min (by C-G formula based on SCr of 0.74 mg/dL).   Assessment: Patient with afib and currently on warfarin was admitted with abdominal pain, nausea, and diarrhea.  INR subtherapeutic on admit at 1.7.  Last PTA dose noted 6/20 on medication history, but patient refused 6/21 dose saying she already took it at home.  PTA dose: 5mg /day except 2.5mg  on Saturdays   Today, INR is therapeutic at 2.02, no bleeding reported  Drug interactions noted with ciprofloxacin and metronidazole, both of which can increase INR. Nausea and diarrhea can also lead to increased INR.  Goal of Therapy:  INR 2-3 Monitor platelets by anticoagulation protocol: Yes   Plan:  Warfarin 3 mg PO x1 tonight  Daily INRs Monitor signs/symptoms of bleeding Monitor for drug interactions   Eudelia Bunch, Pharm.D. [phone] 10/02/2016 10:05 AM

## 2016-10-02 NOTE — Discharge Summary (Signed)
Physician Discharge Summary  Brittany [name] JYN:[id number] DOB: 06/02/1927 DOA: 09/29/2016  PCP: Brittany Neer, MD  Admit date: 09/29/2016 Discharge date: 10/02/2016  Admitted From: Home Disposition:  Home  Recommendations for Outpatient Follow-up:  1. Follow up with PCP in 1 week 2. Follow up with coumadin clinic per usual  3. Ectatic abdominal aorta, at risk for aneurysm development. Recommend followup by ultrasound in 5 years.  Home Health: No  Equipment/Devices: None   Discharge Condition: Stable CODE STATUS: Full  Diet recommendation: Heart healthy   Brief/Interim Summary: Brittany [name] a 83 y.o.femalewith hx of with HTN, diast CHF, afib, bioAVR , chronic gastritis presenting with 2 day history of LUQ abd pain, nausea and diarrhea. She has had poor oral intake, 5-10 stools per day. Last bowel movement was yesterday prior to presentation in the emergency department. In the emergency department, CT scan of the abdomen and pelvis showed mild thickening of the colonic wall consistent with colitis. She was started on IV Cipro and Flagyl. Patient had severe abdominal pain on 6/23 and underwent CTA. Abdomen pelvis to rule out ischemia. This was negative. Full results as below. On 6/24, patient had improvement in symptoms, pain much better, tolerating full meals. We discussed warning signs, clinical deterioration that would prompt her reevaluation in the emergency department. Patient voiced understanding. She is discharged home with oral Cipro and Flagyl.  Discharge Diagnoses:  Principal Problem:   Colitis Active Problems:   Aortic stenosis   Hypertension   Chronic atrial fibrillation (HCC)   S/P AVR (aortic valve replacement)   Chronic diastolic heart failure (HCC)   Abdominal pain   Diarrhea   Chronic back pain  Colitis -CT abd/pelvis: Thickening of the colonic wall, consistent with colitis. -Last colonoscopy 2006 unremarkable per family report  -CTA abd/pelvis: No CT  evidence of bowel ischemia, no current colonic wall thickness to suggest colitis or diverticulitis. Colonic diverticulosis.  -Continue cipro/flagyl   Chronic A Fib   -Coumadin per pharmacy. Follow up with coumadin clinic as usual  -Continue cardizem   Hypomagnesemia -Replaced   Chronic diastolic CHF -Stable  Chronic back pain -Follows pain clinic on vicodin  CAD, hx MI s/p stent, hx bioprosthetic AVR  HLD -Continue lipitor   HTN -Continue lisinopril   Ectatic abdominal aorta  -At risk for aneurysm development. Recommend followup by ultrasound in 5 years.   Discharge Instructions  Discharge Instructions    Call MD for:  difficulty breathing, headache or visual disturbances    Complete by:  As directed    Call MD for:  extreme fatigue    Complete by:  As directed    Call MD for:  hives    Complete by:  As directed    Call MD for:  persistant dizziness or light-headedness    Complete by:  As directed    Call MD for:  persistant nausea and vomiting    Complete by:  As directed    Call MD for:  severe uncontrolled pain    Complete by:  As directed    Call MD for:  temperature >100.4    Complete by:  As directed    Diet - low sodium heart healthy    Complete by:  As directed    Discharge instructions    Complete by:  As directed    You were cared for by a hospitalist during your hospital stay. If you have any questions about your discharge medications or the care you received while you  were in the hospital after you are discharged, you can call the unit and asked to speak with the hospitalist on call if the hospitalist that took care of you is not available. Once you are discharged, your primary care physician will handle any further medical issues. Please note that NO REFILLS for any discharge medications will be authorized once you are discharged, as it is imperative that you return to your primary care physician (or establish a relationship with a primary care  physician if you do not have one) for your aftercare needs so that they can reassess your need for medications and monitor your lab values.   Increase activity slowly    Complete by:  As directed      Allergies as of 10/02/2016      Reactions   Amlodipine Swelling, Other (See Comments)   Reaction:  Unspecified swelling reaction   Amoxicillin Diarrhea, Nausea And Vomiting, Other (See Comments)   Has patient had a PCN reaction causing immediate rash, facial/tongue/throat swelling, SOB or lightheadedness with hypotension: No Has patient had a PCN reaction causing severe rash involving mucus membranes or skin necrosis: No Has patient had a PCN reaction that required hospitalization: No Has patient had a PCN reaction occurring within the last 10 years: No If all of the above answers are "NO", then may proceed with Cephalosporin use.   Carafate [sucralfate] Swelling, Other (See Comments)   Reaction:  Knee swelling/redness    Cephalexin Diarrhea   Codeine Nausea And Vomiting   Irbesartan Swelling, Other (See Comments)   Reaction:  Facial/hand swelling and numbness    Morphine And Related Other (See Comments)   Pt states that she has a history of addiction with this medication.       Neurontin [gabapentin] Swelling, Other (See Comments)   Reaction:  Leg swelling    Nitrofurantoin Monohyd Macro Diarrhea, Nausea And Vomiting   Prednisone Other (See Comments)   Reaction:  Elevated BP   Sulfa Antibiotics Rash      Medication List    TAKE these medications   atorvastatin 20 MG tablet Commonly known as:  LIPITOR Take 20 mg by mouth daily.   cholecalciferol 1000 units tablet Commonly known as:  VITAMIN D Take 1,000 Units by mouth daily.   ciprofloxacin 500 MG tablet Commonly known as:  CIPRO Take 1 tablet (500 mg total) by mouth 2 (two) times daily.   diazepam 5 MG tablet Commonly known as:  VALIUM Take 5 mg by mouth every 8 (eight) hours as needed for anxiety (and/or sleep).    diltiazem 120 MG tablet Commonly known as:  CARDIZEM Take 120 mg by mouth 2 (two) times daily.   fluticasone 50 MCG/ACT nasal spray Commonly known as:  FLONASE Place 2 sprays into both nostrils daily as needed for rhinitis.   furosemide 40 MG tablet Commonly known as:  LASIX Take 1 tablet (40 mg total) by mouth daily. What changed:  how much to take  when to take this  reasons to take this   HYDROcodone-acetaminophen 5-325 MG tablet Commonly known as:  NORCO/VICODIN Take 1 tablet by mouth every 6 (six) hours as needed for moderate pain.   hyoscyamine 0.125 MG tablet Commonly known as:  LEVSIN, ANASPAZ Take 0.125 mg by mouth every 4 (four) hours as needed for cramping.   ipratropium 0.06 % nasal spray Commonly known as:  ATROVENT Place 2 sprays into both nostrils 3 (three) times daily.   lisinopril 40 MG tablet Commonly known as:  PRINIVIL,ZESTRIL Take 20 mg by mouth daily.   metroNIDAZOLE 500 MG tablet Commonly known as:  FLAGYL Take 1 tablet (500 mg total) by mouth 3 (three) times daily.   omega-3 acid ethyl esters 1 g capsule Commonly known as:  LOVAZA Take 1 g by mouth daily.   ondansetron 4 MG tablet Commonly known as:  ZOFRAN Take 4 mg by mouth every 4 (four) hours as needed for nausea or vomiting.   oxybutynin 5 MG tablet Commonly known as:  DITROPAN Take 5-10 mg by mouth 2 (two) times daily. Pt takes one tablet in the morning and two at night.   pantoprazole 40 MG tablet Commonly known as:  PROTONIX Take 1 tablet (40 mg total) by mouth 2 (two) times daily.   potassium chloride 10 MEQ tablet Commonly known as:  K-DUR,KLOR-CON Take 10 mEq by mouth daily as needed (when taking Lasix).   PRESERVISION/LUTEIN Caps Take 1 capsule by mouth 2 (two) times daily.   promethazine 12.5 MG tablet Commonly known as:  PHENERGAN Take 1 tablet (12.5 mg total) by mouth every 6 (six) hours as needed for nausea or vomiting.   ranitidine 150 MG tablet Commonly  known as:  ZANTAC Take 150 mg by mouth at bedtime as needed for heartburn.   warfarin 5 MG tablet Commonly known as:  COUMADIN Take 2.5-5 mg by mouth every evening. Pt takes one-half tablet on Saturday and one tablet all other days.      Follow-up Information    Brittany Neer, MD. Schedule an appointment as soon as possible for a visit in 1 week(s).   Specialty:  Family Medicine Contact information: [address] [place] [address] [phone]          Allergies  Allergen Reactions  . Amlodipine Swelling and Other (See Comments)    Reaction:  Unspecified swelling reaction  . Amoxicillin Diarrhea, Nausea And Vomiting and Other (See Comments)    Has patient had a PCN reaction causing immediate rash, facial/tongue/throat swelling, SOB or lightheadedness with hypotension: No Has patient had a PCN reaction causing severe rash involving mucus membranes or skin necrosis: No Has patient had a PCN reaction that required hospitalization: No Has patient had a PCN reaction occurring within the last 10 years: No If all of the above answers are "NO", then may proceed with Cephalosporin use.  . Carafate [Sucralfate] Swelling and Other (See Comments)    Reaction:  Knee swelling/redness   . Cephalexin Diarrhea  . Codeine Nausea And Vomiting  . Irbesartan Swelling and Other (See Comments)    Reaction:  Facial/hand swelling and numbness   . Morphine And Related Other (See Comments)    Pt states that she has a history of addiction with this medication.      . Neurontin [Gabapentin] Swelling and Other (See Comments)    Reaction:  Leg swelling   . Nitrofurantoin Monohyd Macro Diarrhea and Nausea And Vomiting  . Prednisone Other (See Comments)    Reaction:  Elevated BP  . Sulfa Antibiotics Rash    Consultations:  None   Procedures/Studies: Ct Abdomen Pelvis W Contrast  Result Date: 09/29/2016 CLINICAL DATA:  83 yo F with a chief complaint of epigastric abdominal  pain and diarrhea. Going on for the past couple days. Denies fevers or chills. Denies chest pain shortness of breath. Denies exertional symptoms. EXAM: CT ABDOMEN AND PELVIS WITH CONTRAST TECHNIQUE: Multidetector CT imaging of the abdomen and pelvis was performed using the standard protocol following  bolus administration of intravenous contrast. CONTRAST:  130mL ISOVUE-300 IOPAMIDOL (ISOVUE-300) INJECTION 61% COMPARISON:  CT abdomen and pelvis 02/03/2012 FINDINGS: Lower chest: The heart is mildly enlarged. There is dense mitral annulus calcification. Lung bases are unremarkable. There is lingular atelectasis. Hepatobiliary: Status post cholecystectomy. The liver is normal in appearance. There is mild postsurgical dilatation of the common bile duct, within the limits of normal. Pancreas: Unremarkable. No pancreatic ductal dilatation or surrounding inflammatory changes. Spleen: Normal in size without focal abnormality. Adrenals/Urinary Tract: The adrenal glands are normal in appearance. There is no hydronephrosis. No renal mass. Urinary bladder is normal in appearance. Stomach/Bowel: The stomach is normal in appearance. Small bowel loops are normal in caliber. There is mild thickening of the colon, particularly involving the ascending, transverse, proximal descending colon to a greater degree than the distal colon. There are scattered diverticula but no acute diverticulitis. No evidence for abscess. Status post appendectomy. Vascular/Lymphatic: There is atherosclerotic calcification of the abdominal aorta. Infrarenal course is ectatic measuring 2.5 cm. There is normal vascular opacification of the celiac axis, superior mesenteric artery, and inferior mesenteric artery. Normal appearance of the portal venous system and inferior vena cava. Reproductive: Status post hysterectomy.  No adnexal mass. Other: No ascites.  Anterior abdominal wall is unremarkable. Musculoskeletal: There is degenerative change in the lumbar  spine. IMPRESSION: 1. Thickening of the colonic wall, consistent with colitis. Considerations include ischemic, infectious, or inflammatory causes. 2. Cardiomegaly and coronary artery disease. Mitral annulus calcifications. 3. Lingular atelectasis. 4. Colonic diverticulosis without acute diverticulitis. 5. Aortic atherosclerosis. Ectatic abdominal aorta at risk for aneurysm development. Recommend followup by ultrasound in 5 years. This recommendation follows ACR consensus guidelines: White Paper of the ACR Incidental Findings Committee II on Vascular Findings. J Am Coll Radiol 2013; 10:789-794. 6. Hysterectomy. Electronically Signed   By: Nolon Nations M.D.   On: 09/29/2016 19:47   Ct Angio Abd/pel W/ And/or W/o  Result Date: 10/02/2016 CLINICAL DATA:  Left lower quadrant abdominal pain, nausea and diarrhea. Concern for ischemic colitis of the colon. EXAM: CT ANGIOGRAPHY ABDOMEN AND PELVIS TECHNIQUE: Multidetector CT imaging of the abdomen and pelvis was performed using the standard protocol during bolus administration of intravenous contrast. Multiplanar reconstructed images including MIPs were obtained and reviewed to evaluate the vascular anatomy. CONTRAST:  100 cc Isovue 300 COMPARISON:  09/29/2016 FINDINGS: Arterial findings: Aorta: Moderate atherosclerosis, tortuosity and ectasia of the abdominal aorta without occlusion, significant aneurysm or dissection. No retroperitoneal hemorrhage or rupture. Infrarenal aorta has an AP dimension of 2.4 cm and 3.1 cm transverse, image 98. Celiac axis: Atherosclerotic origin with mild ostial narrowing. Celiac remains patent. Splenic, left gastric, and left hepatic artery are all patent off of the celiac. Superior mesenteric: Atherosclerotic origin without stenosis. SMA remains patent. Replaced right hepatic artery off the proximal SMA, a normal variant. Left renal: Atherosclerotic left renal artery without occlusion or significant stenosis Right renal: Heavily  calcified right renal origin without significant stenosis or occlusion Inferior mesenteric: IMA origin is not well demonstrated. Heavy wall calcification at its origin. IMA is patent but diminutive in caliber. Left iliac: Left iliac atherosclerosis and tortuosity noted. Left common, internal and external iliac arteries are patent. Right iliac: Similar right iliac atherosclerosis and tortuosity without occlusion. Right common, internal and external iliac arteries are patent Venous findings: Venous imaging is limited, suspect related to cardiac output. Portal, mesenteric, splenic, and renal veins are patent. IVC and iliac vessels are not well opacified Review of the MIP images confirms the  above findings. Nonvascular findings: Lower chest: Postop changes of the heart. Aortic valve replacement noted. Remote cholecystectomy. No focal hepatic abnormality. Stable biliary prominence. Pancreas and spleen demonstrate no acute finding. Normal adrenal glands. No renal obstruction or hydronephrosis. No focal renal mass. Vascular calcifications noted within the kidneys bilaterally. Negative for bowel obstruction, significant dilatation, ileus, free air. No fluid collection or abscess. Colon is better distended with stool and gas. No current colonic wall thickening or colitis. Diverticulosis of the left descending colon and sigmoid. No acute inflammatory process. Duodenum diverticulum again noted. Appendix not visualized. No adenopathy. Remote hysterectomy noted. No adnexal mass. No pelvic free fluid or fluid collection. Intact abdominal wall.  Hernia or inguinal abnormality Degenerative changes noted spine.  No acute osseous finding. IMPRESSION: Abdominal atherosclerosis without evidence of occlusive mesenteric vascular disease. No CT evidence of bowel ischemia. Better distention of the colon with gas and stool. No current colonic wall thickening to suggest colitis or diverticulitis. Colonic diverticulosis Electronically Signed    By: Jerilynn Mages.  Shick M.D.   On: 10/02/2016 09:02       Discharge Exam: Vitals:   10/02/16 0551 10/02/16 1049  BP: (!) 131/53 132/60  Pulse: (!) 59   Resp: 17   Temp: 97.5 F (36.4 C)    Vitals:   10/01/16 1700 10/01/16 2124 10/02/16 0551 10/02/16 1049  BP: (!) 131/51 (!) 107/57 (!) 131/53 132/60  Pulse: 61 66 (!) 59   Resp: 16 16 17    Temp: 98.5 F (36.9 C) 98.1 F (36.7 C) 97.5 F (36.4 C)   TempSrc: Oral Oral Oral   SpO2: 97% 94% 93%   Weight:   60.6 kg (133 lb 9.6 oz)   Height:        General: Pt is alert, awake, not in acute distress Cardiovascular: RRR, S1/S2 +, no rubs, no gallops Respiratory: CTA bilaterally, no wheezing, no rhonchi Abdominal: Soft, NT, ND, bowel sounds + Extremities: no edema, no cyanosis    The results of significant diagnostics from this hospitalization (including imaging, microbiology, ancillary and laboratory) are listed below for reference.     Microbiology: No results found for this or any previous visit (from the past 240 hour(s)).   Labs: BNP (last 3 results) No results for input(s): BNP in the last 8760 hours. Basic Metabolic Panel:  Recent Labs Lab 09/29/16 1528 09/29/16 1625 09/30/16 0420 10/01/16 0413 10/02/16 0424  NA 128* 130* 134* 131* 133*  K 3.7 3.8 3.5 3.9 4.0  CL 96* 95* 101 99* 102  CO2 24  --  26 25 24   GLUCOSE 108* 108* 89 93 93  BUN 10 9 9 11 10   CREATININE 0.59 0.50 0.68 0.87 0.74  CALCIUM 8.8*  --  8.7* 8.3* 8.3*  MG  --   --   --  1.5*  --    Liver Function Tests:  Recent Labs Lab 09/29/16 1528  AST 36  ALT 23  ALKPHOS 96  BILITOT 1.3*  PROT 7.4  ALBUMIN 3.6    Recent Labs Lab 09/29/16 1528  LIPASE 30   No results for input(s): AMMONIA in the last 168 hours. CBC:  Recent Labs Lab 09/29/16 1528 09/29/16 1625 10/01/16 0413 10/02/16 0424  WBC 9.5  --  7.6 8.1  NEUTROABS 7.3  --  3.8 4.7  HGB 13.2 14.3 10.6* 10.2*  HCT 38.2 42.0 31.2* 30.3*  MCV 85.1  --  86.7 85.6  PLT 235  --   179 171   Cardiac Enzymes:  No results for input(s): CKTOTAL, CKMB, CKMBINDEX, TROPONINI in the last 168 hours. BNP: Invalid input(s): POCBNP CBG: No results for input(s): GLUCAP in the last 168 hours. D-Dimer No results for input(s): DDIMER in the last 72 hours. Hgb A1c No results for input(s): HGBA1C in the last 72 hours. Lipid Profile No results for input(s): CHOL, HDL, LDLCALC, TRIG, CHOLHDL, LDLDIRECT in the last 72 hours. Thyroid function studies No results for input(s): TSH, T4TOTAL, T3FREE, THYROIDAB in the last 72 hours.  Invalid input(s): FREET3 Anemia work up No results for input(s): VITAMINB12, FOLATE, FERRITIN, TIBC, IRON, RETICCTPCT in the last 72 hours. Urinalysis    Component Value Date/Time   COLORURINE STRAW (A) 09/29/2016 1529   APPEARANCEUR CLEAR 09/29/2016 1529   LABSPEC 1.003 (L) 09/29/2016 1529   PHURINE 7.0 09/29/2016 1529   GLUCOSEU NEGATIVE 09/29/2016 1529   GLUCOSEU NEGATIVE 06/25/2014 1201   HGBUR MODERATE (A) 09/29/2016 1529   BILIRUBINUR NEGATIVE 09/29/2016 1529   KETONESUR NEGATIVE 09/29/2016 1529   PROTEINUR NEGATIVE 09/29/2016 1529   UROBILINOGEN 0.2 08/27/2014 1954   NITRITE NEGATIVE 09/29/2016 1529   LEUKOCYTESUR NEGATIVE 09/29/2016 1529   Sepsis Labs Invalid input(s): PROCALCITONIN,  WBC,  LACTICIDVEN Microbiology No results found for this or any previous visit (from the past 240 hour(s)).   Time coordinating discharge: 40 minutes  SIGNED:  Dessa Phi, DO [facility] Pager [phone]  If 7PM-7AM, please contact night-coverage [email or web address] Password [facility] 10/02/2016, 11:41 AM

## 2016-10-04 ENCOUNTER — Ambulatory Visit (INDEPENDENT_AMBULATORY_CARE_PROVIDER_SITE_OTHER): Payer: Medicare Other | Admitting: *Deleted

## 2016-10-04 DIAGNOSIS — I214 Non-ST elevation (NSTEMI) myocardial infarction: Secondary | ICD-10-CM | POA: Diagnosis not present

## 2016-10-04 DIAGNOSIS — Z5181 Encounter for therapeutic drug level monitoring: Secondary | ICD-10-CM | POA: Diagnosis not present

## 2016-10-04 LAB — POCT INR: INR: 1.9

## 2016-10-07 ENCOUNTER — Other Ambulatory Visit: Payer: Self-pay | Admitting: Cardiovascular Disease

## 2016-10-10 ENCOUNTER — Ambulatory Visit (INDEPENDENT_AMBULATORY_CARE_PROVIDER_SITE_OTHER): Payer: Medicare Other | Admitting: *Deleted

## 2016-10-10 DIAGNOSIS — I214 Non-ST elevation (NSTEMI) myocardial infarction: Secondary | ICD-10-CM

## 2016-10-10 DIAGNOSIS — Z5181 Encounter for therapeutic drug level monitoring: Secondary | ICD-10-CM

## 2016-10-10 LAB — POCT INR: INR: 4.5

## 2016-10-14 DIAGNOSIS — K529 Noninfective gastroenteritis and colitis, unspecified: Secondary | ICD-10-CM | POA: Diagnosis not present

## 2016-10-20 ENCOUNTER — Other Ambulatory Visit: Payer: Self-pay | Admitting: Gastroenterology

## 2016-10-25 ENCOUNTER — Ambulatory Visit (INDEPENDENT_AMBULATORY_CARE_PROVIDER_SITE_OTHER): Payer: Medicare Other

## 2016-10-25 DIAGNOSIS — I214 Non-ST elevation (NSTEMI) myocardial infarction: Secondary | ICD-10-CM

## 2016-10-25 DIAGNOSIS — Z5181 Encounter for therapeutic drug level monitoring: Secondary | ICD-10-CM | POA: Diagnosis not present

## 2016-10-25 LAB — POCT INR: INR: 2

## 2016-10-31 DIAGNOSIS — M19011 Primary osteoarthritis, right shoulder: Secondary | ICD-10-CM | POA: Diagnosis not present

## 2016-11-08 DIAGNOSIS — M48061 Spinal stenosis, lumbar region without neurogenic claudication: Secondary | ICD-10-CM | POA: Diagnosis not present

## 2016-11-08 DIAGNOSIS — M5416 Radiculopathy, lumbar region: Secondary | ICD-10-CM | POA: Diagnosis not present

## 2016-11-08 DIAGNOSIS — Z6825 Body mass index (BMI) 25.0-25.9, adult: Secondary | ICD-10-CM | POA: Diagnosis not present

## 2016-11-15 ENCOUNTER — Ambulatory Visit (INDEPENDENT_AMBULATORY_CARE_PROVIDER_SITE_OTHER): Payer: Medicare Other

## 2016-11-15 ENCOUNTER — Encounter (INDEPENDENT_AMBULATORY_CARE_PROVIDER_SITE_OTHER): Payer: Self-pay

## 2016-11-15 DIAGNOSIS — I214 Non-ST elevation (NSTEMI) myocardial infarction: Secondary | ICD-10-CM

## 2016-11-15 DIAGNOSIS — Z5181 Encounter for therapeutic drug level monitoring: Secondary | ICD-10-CM | POA: Diagnosis not present

## 2016-11-15 LAB — POCT INR: INR: 2.9

## 2016-11-23 DIAGNOSIS — M8588 Other specified disorders of bone density and structure, other site: Secondary | ICD-10-CM | POA: Diagnosis not present

## 2016-12-08 ENCOUNTER — Other Ambulatory Visit: Payer: Self-pay | Admitting: Cardiovascular Disease

## 2016-12-13 ENCOUNTER — Ambulatory Visit (INDEPENDENT_AMBULATORY_CARE_PROVIDER_SITE_OTHER): Payer: Medicare Other | Admitting: *Deleted

## 2016-12-13 DIAGNOSIS — I214 Non-ST elevation (NSTEMI) myocardial infarction: Secondary | ICD-10-CM | POA: Diagnosis not present

## 2016-12-13 DIAGNOSIS — Z5181 Encounter for therapeutic drug level monitoring: Secondary | ICD-10-CM | POA: Diagnosis not present

## 2016-12-13 LAB — POCT INR: INR: 2.8

## 2017-01-04 ENCOUNTER — Other Ambulatory Visit: Payer: Self-pay | Admitting: Cardiovascular Disease

## 2017-01-04 ENCOUNTER — Encounter: Payer: Self-pay | Admitting: Gastroenterology

## 2017-01-11 ENCOUNTER — Ambulatory Visit (INDEPENDENT_AMBULATORY_CARE_PROVIDER_SITE_OTHER): Payer: Medicare Other

## 2017-01-11 DIAGNOSIS — I214 Non-ST elevation (NSTEMI) myocardial infarction: Secondary | ICD-10-CM | POA: Diagnosis not present

## 2017-01-11 DIAGNOSIS — Z5181 Encounter for therapeutic drug level monitoring: Secondary | ICD-10-CM | POA: Diagnosis not present

## 2017-01-11 LAB — POCT INR: INR: 3.5

## 2017-01-17 ENCOUNTER — Other Ambulatory Visit: Payer: Self-pay | Admitting: Gastroenterology

## 2017-01-26 DIAGNOSIS — H353124 Nonexudative age-related macular degeneration, left eye, advanced atrophic with subfoveal involvement: Secondary | ICD-10-CM | POA: Diagnosis not present

## 2017-01-26 DIAGNOSIS — H353112 Nonexudative age-related macular degeneration, right eye, intermediate dry stage: Secondary | ICD-10-CM | POA: Diagnosis not present

## 2017-01-26 DIAGNOSIS — H35033 Hypertensive retinopathy, bilateral: Secondary | ICD-10-CM | POA: Diagnosis not present

## 2017-01-26 DIAGNOSIS — H40013 Open angle with borderline findings, low risk, bilateral: Secondary | ICD-10-CM | POA: Diagnosis not present

## 2017-02-01 ENCOUNTER — Ambulatory Visit (INDEPENDENT_AMBULATORY_CARE_PROVIDER_SITE_OTHER): Payer: Medicare Other | Admitting: *Deleted

## 2017-02-01 DIAGNOSIS — Z5181 Encounter for therapeutic drug level monitoring: Secondary | ICD-10-CM | POA: Diagnosis not present

## 2017-02-01 DIAGNOSIS — I214 Non-ST elevation (NSTEMI) myocardial infarction: Secondary | ICD-10-CM

## 2017-02-01 LAB — POCT INR: INR: 3.9

## 2017-02-06 DIAGNOSIS — M5416 Radiculopathy, lumbar region: Secondary | ICD-10-CM | POA: Diagnosis not present

## 2017-02-06 DIAGNOSIS — M19011 Primary osteoarthritis, right shoulder: Secondary | ICD-10-CM | POA: Diagnosis not present

## 2017-02-15 ENCOUNTER — Encounter (INDEPENDENT_AMBULATORY_CARE_PROVIDER_SITE_OTHER): Payer: Self-pay

## 2017-02-15 ENCOUNTER — Ambulatory Visit (INDEPENDENT_AMBULATORY_CARE_PROVIDER_SITE_OTHER): Payer: Medicare Other | Admitting: *Deleted

## 2017-02-15 DIAGNOSIS — I482 Chronic atrial fibrillation, unspecified: Secondary | ICD-10-CM

## 2017-02-15 DIAGNOSIS — Z5181 Encounter for therapeutic drug level monitoring: Secondary | ICD-10-CM

## 2017-02-15 DIAGNOSIS — I214 Non-ST elevation (NSTEMI) myocardial infarction: Secondary | ICD-10-CM

## 2017-02-15 DIAGNOSIS — Z952 Presence of prosthetic heart valve: Secondary | ICD-10-CM

## 2017-02-15 LAB — POCT INR: INR: 2.6

## 2017-02-27 DIAGNOSIS — I130 Hypertensive heart and chronic kidney disease with heart failure and stage 1 through stage 4 chronic kidney disease, or unspecified chronic kidney disease: Secondary | ICD-10-CM | POA: Diagnosis not present

## 2017-02-27 DIAGNOSIS — Z23 Encounter for immunization: Secondary | ICD-10-CM | POA: Diagnosis not present

## 2017-02-27 DIAGNOSIS — I2510 Atherosclerotic heart disease of native coronary artery without angina pectoris: Secondary | ICD-10-CM | POA: Diagnosis not present

## 2017-02-27 DIAGNOSIS — I5030 Unspecified diastolic (congestive) heart failure: Secondary | ICD-10-CM | POA: Diagnosis not present

## 2017-02-27 DIAGNOSIS — H3530 Unspecified macular degeneration: Secondary | ICD-10-CM | POA: Diagnosis not present

## 2017-02-27 DIAGNOSIS — N182 Chronic kidney disease, stage 2 (mild): Secondary | ICD-10-CM | POA: Diagnosis not present

## 2017-02-27 DIAGNOSIS — I482 Chronic atrial fibrillation: Secondary | ICD-10-CM | POA: Diagnosis not present

## 2017-02-27 DIAGNOSIS — J449 Chronic obstructive pulmonary disease, unspecified: Secondary | ICD-10-CM | POA: Diagnosis not present

## 2017-02-27 DIAGNOSIS — I69993 Ataxia following unspecified cerebrovascular disease: Secondary | ICD-10-CM | POA: Diagnosis not present

## 2017-02-27 DIAGNOSIS — E782 Mixed hyperlipidemia: Secondary | ICD-10-CM | POA: Diagnosis not present

## 2017-03-08 ENCOUNTER — Ambulatory Visit (INDEPENDENT_AMBULATORY_CARE_PROVIDER_SITE_OTHER): Payer: Medicare Other

## 2017-03-08 DIAGNOSIS — Z5181 Encounter for therapeutic drug level monitoring: Secondary | ICD-10-CM | POA: Diagnosis not present

## 2017-03-08 DIAGNOSIS — I214 Non-ST elevation (NSTEMI) myocardial infarction: Secondary | ICD-10-CM | POA: Diagnosis not present

## 2017-03-08 LAB — POCT INR: INR: 2.6

## 2017-03-08 NOTE — Patient Instructions (Signed)
Continue taking 1 tablet daily except 1/2 tablet on Tuesdays and Saturdays. Recheck in 4 weeks.  Call if any changes [phone]

## 2017-03-14 DIAGNOSIS — L821 Other seborrheic keratosis: Secondary | ICD-10-CM | POA: Diagnosis not present

## 2017-03-14 DIAGNOSIS — L570 Actinic keratosis: Secondary | ICD-10-CM | POA: Diagnosis not present

## 2017-03-16 ENCOUNTER — Encounter: Payer: Self-pay | Admitting: Gastroenterology

## 2017-03-16 ENCOUNTER — Ambulatory Visit (INDEPENDENT_AMBULATORY_CARE_PROVIDER_SITE_OTHER): Payer: Medicare Other | Admitting: Gastroenterology

## 2017-03-16 VITALS — BP 110/58 | HR 46 | Ht 61.0 in | Wt 137.4 lb

## 2017-03-16 DIAGNOSIS — R1013 Epigastric pain: Secondary | ICD-10-CM

## 2017-03-16 DIAGNOSIS — Z8719 Personal history of other diseases of the digestive system: Secondary | ICD-10-CM | POA: Diagnosis not present

## 2017-03-16 DIAGNOSIS — I214 Non-ST elevation (NSTEMI) myocardial infarction: Secondary | ICD-10-CM | POA: Diagnosis not present

## 2017-03-16 DIAGNOSIS — K219 Gastro-esophageal reflux disease without esophagitis: Secondary | ICD-10-CM

## 2017-03-16 DIAGNOSIS — R932 Abnormal findings on diagnostic imaging of liver and biliary tract: Secondary | ICD-10-CM | POA: Diagnosis not present

## 2017-03-16 NOTE — Patient Instructions (Signed)
If you are age 81 or older, your body mass index should be between 23-30. Your Body mass index is 25.96 kg/m. If this is out of the aforementioned range listed, please consider follow up with your Primary Care Provider.  If you are age 21 or younger, your body mass index should be between 19-25. Your Body mass index is 25.96 kg/m. If this is out of the aformentioned range listed, please consider follow up with your Primary Care Provider.   You have been scheduled for an abdominal ultrasound at [facility] (1st floor of hospital) on Thursday, Dec. 13th at 10:30am. Please arrive 15 minutes prior to your appointment for registration. Make certain not to have anything to eat or drink 6 hours prior to your appointment. Should you need to reschedule your appointment, please contact radiology at [phone]. This test typically takes about 30 minutes to perform.  Please continue with your medications as discussed.  Please follow up with [place] in 6 months.   Thank you for entrusting me with your care, Dr. Nashotah Cellar

## 2017-03-16 NOTE — Progress Notes (Signed)
HPI :  83 year old female with chronic dyspepsia and narcotic use, here for a follow-up visit. On coumadin for history of A fibb and TIA  The last time I saw her was in May. I had recommended minimizing long-term narcotics, continuing buspirone, continuing Zofran as needed. Generally she has been feeling well regarding her stomach. She states that buspirone has significant only helped her. I don't see it listed in her medication list however she states she is taking 15 mg twice a day. She is taking Zofran as needed. There are generally she is feeling well in this regard.  And fortunately she was hospitalized this past June she had severe abdominal pain and CT scan showed inflammation of her ascending transverse and descending colon. She denied any diarrhea or blood in her stools during this time. She had a subsequent CT angio done given concern for potential ischemia, and this study showed no vascular compromise. On the study her colon also appeared back to normal. He was discharged home and has not had any recurrence of symptoms. Denies any blood in her stools and is generally feeling quite well.   Of note her liver appeared normal on her last CT scan in June. She had [place] with elastography to further evaluate for possible cirrhosis done on 06/15/16. The [place] was suggestive of cirrhosis of the liver but fibrosis score mostly F2, some F3. Spleen and platelets were normal. Repeat blood work showed normal LFTs, normal CBC, negative testing for hep B/C. Negative iron studies and alpha one antitrypsin historically. She denies any significant alcohol use in the past. Sister had alcoholic cirrhosis.   She otherwise states she takes high-dose Protonix and Zantac every day. She states her symptoms bother her if she is not on these doses.   Last colonoscopy 2006 - diverticulosis, hemorrhoids, small 66mm hyperplastic polyp left side EGD 08/18/2014 - no varices  Past Medical History:  Diagnosis Date  . Arthritis     . Atrial fibrillation ([place])   . Chronic back pain   . Diverticulosis   . Dyslipidemia    takes Lipitor daily  . Dysrhythmia    Atrial Fibrillation  . Foot drop    SINCE 1987  . GERD (gastroesophageal reflux disease)   . H/O hiatal hernia   . Hearing impaired    Bilateral hearing aids  . History of bacterial endocarditis   . History of gastritis   . History of stomach ulcers    many yrs ago  . History of TIA (transient ischemic attack)    2013-- RESIDUAL PERIPHERAL VISION RIGHT EYE--  RESOLVED  . Hypertension   . IC (interstitial cystitis)   . Pulmonary hypertension ([place]) 10/2013   Based on TTE  . S/P aortic valve replacement    2005  . Urine incontinence      Past Surgical History:  Procedure Laterality Date  . ABDOMINAL HYSTERECTOMY  1967  . AORTIC VALVE REPLACEMENT  09-29-2003    [facility] pericardial tissue valve  . APPENDECTOMY  1933  . CARDIAC CATHETERIZATION  07-15-2003 DR HELEN PRESTON   MODERATE TO MODERATELY SEVERE CALCIFIC AORTIC STENOSIS/  NORMAL CORONARY ARTERIES  . CARDIOVASCULAR STRESS TEST  01-30-2012  DR NASHER   NORMAL NUCLEAR STUDY/  EF 73%/  NORMAL LVF  . CARPAL TUNNEL RELEASE Bilateral   . CATARACT EXTRACTION W/ INTRAOCULAR LENS  IMPLANT, BILATERAL    . CHOLECYSTECTOMY  1993  . CYSTO WITH HYDRODISTENSION N/A 02/05/2013   Procedure: CYSTOSCOPY/HYDRODISTENSION;  Surgeon: Irine Seal,  MD;  Location: [place];  Service: Urology;  Laterality: N/A;  . CYSTO/ HYDRODISTENTION/ INSTILLATION CLORPACTIN  MULTIPLE  last one 2009  . DILATION AND CURETTAGE OF UTERUS    . ERCP N/A 08/10/2012   Procedure: ENDOSCOPIC RETROGRADE CHOLANGIOPANCREATOGRAPHY (ERCP);  Surgeon: Inda Castle, MD;  Location: [place];  Service: Gastroenterology;  Laterality: N/A;  . ESOPHAGOGASTRODUODENOSCOPY (EGD) WITH PROPOFOL N/A 08/18/2014   Procedure: ESOPHAGOGASTRODUODENOSCOPY (EGD) WITH PROPOFOL;  Surgeon: Inda Castle, MD;  Location: [facility] END[facility];   Service: Endoscopy;  Laterality: N/A;  . LEFT HEART CATHETERIZATION WITH CORONARY ANGIOGRAM N/A 10/18/2013   Procedure: LEFT HEART CATHETERIZATION WITH CORONARY ANGIOGRAM;  Surgeon: Jettie Booze, MD;  Location[facility] C[facility] LAB;  Service: Cardiovascular;  Laterality: N/A;  . LUMBAR [place] &  2007  . MACROPLASTIQUE URETHRAL IMPLANTATION  08-27-2009  . MINI-OPEN RIGHT ROTATOR CUFF REPAIR  07-12-2001  . RIGHT SHOULDER ARTHROSCOPY W/ DEBRIDEMENT ROTATOR CUFF AND LABRAL TEAR/ ACROMINOPLASTY/ DISTAL CLAVICLE EXCISION/ CA LIGAMENT RELEASE  10-08-1999  . TONSILLECTOMY AND ADENOIDECTOMY    . TRANSTHORACIC ECHOCARDIOGRAM  08-10-2011   MILD LVH/  EF 55-60%/  NORMAL AVR TISSUE/ MILD MR/  MODERATE DILATED LA/  MILD DILATED RA  . TRANSVAGINAL TAPE PROCEDURE  07-30-2002  . VEIN LIGATION  1969   RIGHT LOWER LEG   Family History  Problem Relation Age of Onset  . Heart disease Father   . Stomach cancer Maternal Grandmother   . Esophageal cancer Maternal Grandfather   . Bipolar disorder Son        Committed Suicide  . Coronary artery disease Brother   . Bipolar disorder Brother        commited suiside at 66yo  . Alcohol abuse Sister        sister #1  . Alcohol abuse Sister        sister #2  . Colon cancer Maternal Aunt   . Lung disease Neg Hx    Social History   Tobacco Use  . Smoking status: Former Smoker    Packs/day: 0.50    Years: 40.00    Pack years: 20.00    Types: Cigarettes    Start date: 05/06/1949    Last attempt to quit: 03/06/1990    Years since quitting: 27.0  . Smokeless tobacco: Never Used  Substance Use Topics  . Alcohol use: No    Alcohol/week: 0.0 oz    Comment: Remote social EtOH  . Drug use: No   Current Outpatient Medications  Medication Sig Dispense Refill  . atorvastatin (LIPITOR) 20 MG tablet Take 20 mg by mouth daily.    . cholecalciferol (VITAMIN D) 1000 units tablet Take 1,000 Units by mouth daily.    . diazepam (VALIUM) 5 MG tablet Take 5 mg  by mouth every 8 (eight) hours as needed for anxiety (and/or sleep).     Marland Kitchen diltiazem (CARDIZEM) 120 MG tablet Take 120 mg by mouth 2 (two) times daily.    . fluticasone (FLONASE) 50 MCG/ACT nasal spray Place 2 sprays into both nostrils daily as needed for rhinitis.    . furosemide (LASIX) 40 MG tablet TAKE ONE TABLET BY MOUTH DAILY 90 tablet 2  . HYDROcodone-acetaminophen (NORCO/VICODIN) 5-325 MG per tablet Take 1 tablet by mouth every 6 (six) hours as needed for moderate pain.     . hyoscyamine (LEVSIN, ANASPAZ) 0.125 MG tablet Take 0.125 mg by mouth every 4 (four) hours as needed for cramping.     Marland Kitchen  ipratropium (ATROVENT) 0.06 % nasal spray Place 2 sprays into both nostrils 3 (three) times daily.    Marland Kitchen lisinopril (PRINIVIL,ZESTRIL) 40 MG tablet Take 20 mg by mouth daily.     . Multiple Vitamins-Minerals (PRESERVISION/LUTEIN) CAPS Take 1 capsule by mouth 2 (two) times daily.    Marland Kitchen omega-3 acid ethyl esters (LOVAZA) 1 g capsule Take 1 g by mouth daily.    . ondansetron (ZOFRAN) 4 MG tablet Take 4 mg by mouth every 4 (four) hours as needed for nausea or vomiting.    . ondansetron (ZOFRAN-ODT) 8 MG disintegrating tablet DISSOLVE ONE TABLET IN MOUTH EVERY 8 HOURS AS NEEDED FOR NAUSEA OR VOMITING 20 tablet 1  . oxybutynin (DITROPAN) 5 MG tablet Take 5-10 mg by mouth 2 (two) times daily. Pt takes one tablet in the morning and two at night.    . pantoprazole (PROTONIX) 40 MG tablet Take 1 tablet (40 mg total) by mouth 2 (two) times daily. 180 tablet 3  . potassium chloride (K-DUR) 10 MEQ tablet TAKE ONE TABLET BY MOUTH DAILY 90 tablet 2  . potassium chloride (K-DUR,KLOR-CON) 10 MEQ tablet Take 10 mEq by mouth daily as needed (when taking Lasix).    . promethazine (PHENERGAN) 12.5 MG tablet Take 1 tablet (12.5 mg total) by mouth every 6 (six) hours as needed for nausea or vomiting. 30 tablet 0  . ranitidine (ZANTAC) 150 MG tablet Take 150 mg by mouth at bedtime as needed for heartburn.     . warfarin  (COUMADIN) 5 MG tablet TAKE ONE TABLET BY MOUTH DAILY OR AS DIRECTED BY COUMADIN CLINIC 30 tablet 3   No current facility-administered medications for this visit.    Allergies  Allergen Reactions  . Amlodipine Swelling and Other (See Comments)    Reaction:  Unspecified swelling reaction  . Amoxicillin Diarrhea, Nausea And Vomiting and Other (See Comments)    Has patient had a PCN reaction causing immediate rash, facial/tongue/throat swelling, SOB or lightheadedness with hypotension: No Has patient had a PCN reaction causing severe rash involving mucus membranes or skin necrosis: No Has patient had a PCN reaction that required hospitalization: No Has patient had a PCN reaction occurring within the last 10 years: No If all of the above answers are "NO", then may proceed with Cephalosporin use.  . Carafate [Sucralfate] Swelling and Other (See Comments)    Reaction:  Knee swelling/redness   . Cephalexin Diarrhea  . Codeine Nausea And Vomiting  . Irbesartan Swelling and Other (See Comments)    Reaction:  Facial/hand swelling and numbness   . Morphine And Related Other (See Comments)    Pt states that she has a history of addiction with this medication.      . Neurontin [Gabapentin] Swelling and Other (See Comments)    Reaction:  Leg swelling   . Nitrofurantoin Monohyd Macro Diarrhea and Nausea And Vomiting  . Prednisone Other (See Comments)    Reaction:  Elevated BP  . Sulfa Antibiotics Rash     Review of Systems: All systems reviewed and negative except where noted in HPI.   Lab Results  Component Value Date   WBC 8.1 10/02/2016   HGB 10.2 (L) 10/02/2016   HCT 30.3 (L) 10/02/2016   MCV 85.6 10/02/2016   PLT 171 10/02/2016    Lab Results  Component Value Date   CREATININE 0.74 10/02/2016   BUN 10 10/02/2016   NA 133 (L) 10/02/2016   K 4.0 10/02/2016   CL 102 10/02/2016  CO2 24 10/02/2016    Lab Results  Component Value Date   ALT 23 09/29/2016   AST 36 09/29/2016     ALKPHOS 96 09/29/2016   BILITOT 1.3 (H) 09/29/2016     Physical Exam: BP (!) 110/58   Pulse (!) 46   Ht 5\' 1"  (1.549 m)   Wt 137 lb 6.4 oz (62.3 kg)   BMI 25.96 kg/m  Constitutional: Pleasant,, female in no acute distress, uses walker HEENT: Normocephalic and atraumatic. Conjunctivae are normal. No scleral icterus. Neck supple.  Cardiovascular: Normal rate, regular rhythm.  Pulmonary/chest: Effort normal and breath sounds normal. No wheezing, rales or rhonchi. Abdominal: Soft, nondistended, nontender. . There are no masses palpable. No hepatomegaly. Extremities: no edema Lymphadenopathy: No cervical adenopathy noted. Neurological: Alert and oriented to person place and time. Skin: Skin is warm and dry. No rashes noted. Psychiatric: Normal mood and affect. behavior is normal.   ASSESSMENT AND PLAN: 83 year old female here for reassessment following issues:  Dyspepsia - suspect related in part to chronic narcotic use, and I again do not think chronic narcotics with her history of falls, Coumadin use, and suspected underlying liver disease is a good idea. Recommend she come off of them but she doesn't think she can do this. She will continue Buspirone and Zofran as needed, as it very well controlled in regards to dyspepsia at this time.  Abnormal liver imaging - cirrhotic appearing liver on regular ultrasound, however elastography shows score of mostly F2, some of F3. Spleen and platelets are normal. LFTs are normal.  I discussed that it is possible she has cirrhosis, however I'm not convinced that she has it. Liver looks normal on last CT Scan in June. See prior note for full discussion of this issue. If she does have cirrhosis unclear etiology, negative workup thus far, and not pursuing liver biopsy based on prior discussion with her. She is interested in [facility] screening with an [place] to re-evaluate, but is not interested in EGD for varices screening. Will await [place]. I will also obtain  baseline labs from her primary care which were done recently to ensure stable.  GERD - I think she should try to wean off Zantac if possible and if successful, try to lower dose of Protonix. She is not sure she can do this, but we'll see how she does.. Recommend using lowest dose needed to control symptoms. She has no history of Barrett's.  History of colitis - mostly pain brought her into the hospital which led to CT showing colitis as outlined. No diarrhea or bloody stools per her report, this would make infectious seem less likely. CT angio showed no vascular compromise. In general I discussed how we normally follow-up CT scan finding such as this with a colonoscopy, however given her age and comorbidities am hesitant to do so. She strongly declined colonoscopy and never wishes to have one again. If she has recurrence of her previous pain she should contact us.   Luna Pier Cellar, MD [facility] Gastroenterology Pager [phone]

## 2017-03-22 ENCOUNTER — Other Ambulatory Visit: Payer: Self-pay | Admitting: Gastroenterology

## 2017-03-22 NOTE — Telephone Encounter (Signed)
Yes that's fine. She can have 4mg  ODT q 8 hours PRN nausea. #60 RF3. Thanks

## 2017-03-22 NOTE — Telephone Encounter (Signed)
OK to refill Zofran-ODT? If so, how much would you like to give her? Thanks.

## 2017-03-23 ENCOUNTER — Ambulatory Visit (HOSPITAL_COMMUNITY): Payer: Medicare Other

## 2017-03-23 ENCOUNTER — Ambulatory Visit (HOSPITAL_COMMUNITY)
Admission: RE | Admit: 2017-03-23 | Discharge: 2017-03-23 | Disposition: A | Discharge status: Home / Self Care | Payer: Medicare Other | Source: Ambulatory Visit | Attending: Gastroenterology | Admitting: Gastroenterology

## 2017-03-23 ENCOUNTER — Other Ambulatory Visit: Payer: Self-pay

## 2017-03-23 DIAGNOSIS — R932 Abnormal findings on diagnostic imaging of liver and biliary tract: Secondary | ICD-10-CM | POA: Diagnosis not present

## 2017-03-23 DIAGNOSIS — Z8719 Personal history of other diseases of the digestive system: Secondary | ICD-10-CM | POA: Insufficient documentation

## 2017-03-23 DIAGNOSIS — K7689 Other specified diseases of liver: Secondary | ICD-10-CM | POA: Diagnosis not present

## 2017-03-23 DIAGNOSIS — Z9049 Acquired absence of other specified parts of digestive tract: Secondary | ICD-10-CM | POA: Insufficient documentation

## 2017-03-23 MED ORDER — ONDANSETRON HCL 4 MG PO TABS: 4.0000 mg | ORAL_TABLET | Freq: Three times a day (TID) | ORAL | 3 refills | Status: DC | PRN

## 2017-03-24 ENCOUNTER — Ambulatory Visit: Payer: Medicare Other | Admitting: Cardiovascular Disease

## 2017-03-31 ENCOUNTER — Telehealth: Payer: Self-pay | Admitting: Gastroenterology

## 2017-03-31 NOTE — Telephone Encounter (Signed)
Correction, newer labs noted on another form that came in.  Date 02/27/2017 - BUN 13, Cr 0.79, NA 132, Alb 4.0, T bil 0.7, AP 86, AST 38, ALT 23  I think okay to hold off on repeat labs for another 6 months (CBC, CMET).  Jan can you place a recall for labs, no need to contact patient. Thanks

## 2017-03-31 NOTE — Telephone Encounter (Deleted)
Labs obtained from primary care, however done this past May. Labs in [place] from June are most recent.  Jan may be good to repeat CBC and CMET for Brittany [name] in the next 1-2 months when she can go to the lab. Can you let her know? Thanks

## 2017-04-03 ENCOUNTER — Emergency Department (HOSPITAL_COMMUNITY): Payer: Medicare Other

## 2017-04-03 ENCOUNTER — Emergency Department (HOSPITAL_COMMUNITY)
Admission: EM | Admit: 2017-04-03 | Discharge: 2017-04-03 | Disposition: A | Discharge status: Home / Self Care | Payer: Medicare Other | Attending: Emergency Medicine | Admitting: Emergency Medicine

## 2017-04-03 ENCOUNTER — Encounter (HOSPITAL_COMMUNITY): Payer: Self-pay | Admitting: *Deleted

## 2017-04-03 DIAGNOSIS — S0101XA Laceration without foreign body of scalp, initial encounter: Secondary | ICD-10-CM | POA: Diagnosis not present

## 2017-04-03 DIAGNOSIS — I5032 Chronic diastolic (congestive) heart failure: Secondary | ICD-10-CM | POA: Insufficient documentation

## 2017-04-03 DIAGNOSIS — I110 Hypertensive heart disease with heart failure: Secondary | ICD-10-CM | POA: Insufficient documentation

## 2017-04-03 DIAGNOSIS — R0781 Pleurodynia: Secondary | ICD-10-CM | POA: Diagnosis not present

## 2017-04-03 DIAGNOSIS — S299XXA Unspecified injury of thorax, initial encounter: Secondary | ICD-10-CM | POA: Diagnosis not present

## 2017-04-03 DIAGNOSIS — Y9389 Activity, other specified: Secondary | ICD-10-CM | POA: Diagnosis not present

## 2017-04-03 DIAGNOSIS — S064X0A Epidural hemorrhage without loss of consciousness, initial encounter: Secondary | ICD-10-CM | POA: Diagnosis not present

## 2017-04-03 DIAGNOSIS — Z79899 Other long term (current) drug therapy: Secondary | ICD-10-CM | POA: Diagnosis not present

## 2017-04-03 DIAGNOSIS — Z87891 Personal history of nicotine dependence: Secondary | ICD-10-CM | POA: Diagnosis not present

## 2017-04-03 DIAGNOSIS — S20212A Contusion of left front wall of thorax, initial encounter: Secondary | ICD-10-CM

## 2017-04-03 DIAGNOSIS — Z9049 Acquired absence of other specified parts of digestive tract: Secondary | ICD-10-CM | POA: Diagnosis not present

## 2017-04-03 DIAGNOSIS — S0990XA Unspecified injury of head, initial encounter: Secondary | ICD-10-CM | POA: Diagnosis not present

## 2017-04-03 DIAGNOSIS — Y9289 Other specified places as the place of occurrence of the external cause: Secondary | ICD-10-CM | POA: Insufficient documentation

## 2017-04-03 DIAGNOSIS — Y998 Other external cause status: Secondary | ICD-10-CM | POA: Insufficient documentation

## 2017-04-03 DIAGNOSIS — W0110XA Fall on same level from slipping, tripping and stumbling with subsequent striking against unspecified object, initial encounter: Secondary | ICD-10-CM | POA: Insufficient documentation

## 2017-04-03 DIAGNOSIS — Z7901 Long term (current) use of anticoagulants: Secondary | ICD-10-CM | POA: Insufficient documentation

## 2017-04-03 MED ORDER — LIDOCAINE-EPINEPHRINE (PF) 2 %-1:200000 IJ SOLN
10.0000 mL | Freq: Once | INTRAMUSCULAR | Status: AC
  Administered 2017-04-03: 10 mL
  Filled 2017-04-03: qty 20

## 2017-04-03 MED ORDER — HYDROCODONE-ACETAMINOPHEN 5-325 MG PO TABS
1.0000 | ORAL_TABLET | Freq: Once | ORAL | Status: AC
  Administered 2017-04-03: 1 via ORAL
  Filled 2017-04-03: qty 1

## 2017-04-03 NOTE — Discharge Instructions (Signed)
Staple removal in 7 days with primary care physician.

## 2017-04-03 NOTE — ED Provider Notes (Addendum)
[facility] Provider Note   CSN: [id number] Arrival date & time: 04/03/17  1459     History   Chief Complaint Chief Complaint  Patient presents with  . Fall    on Coumadin    HPI Brittany [name] is a 83 y.o. female. Chief complaint is fall.  HPI:  This is an 83 year old female. She her daughter were coming out of a nail salon. She missed a small 6 inch step and fell forward. She complains of pain in her left ribs and has a small left parietal occipital scalp laceration. She is anticoagulated with Coumadin with history of AVR.  Past Medical History:  Diagnosis Date  . Arthritis   . Atrial fibrillation ([place])   . Chronic back pain   . Diverticulosis   . Dyslipidemia    takes Lipitor daily  . Dysrhythmia    Atrial Fibrillation  . Foot drop    SINCE 1987  . GERD (gastroesophageal reflux disease)   . H/O hiatal hernia   . Hearing impaired    Bilateral hearing aids  . History of bacterial endocarditis   . History of gastritis   . History of stomach ulcers    many yrs ago  . History of TIA (transient ischemic attack)    2013-- RESIDUAL PERIPHERAL VISION RIGHT EYE--  RESOLVED  . Hypertension   . IC (interstitial cystitis)   . Pulmonary hypertension ([place]) 10/2013   Based on TTE  . S/P aortic valve replacement    2005  . Urine incontinence     Patient Active Problem List   Diagnosis Date Noted  . Encounter for therapeutic drug monitoring 10/04/2016  . Colitis 09/29/2016  . Abdominal pain 09/29/2016  . Diarrhea 09/29/2016  . Chronic back pain 09/29/2016  . Dyspepsia 04/14/2016  . Cough 12/02/2014  . Emphysema lung ([place]) 12/02/2014  . Oral thrush 12/02/2014  . Multiple lung nodules on CT 10/07/2014  . Nausea without vomiting 09/30/2014  . Hyponatremia 08/27/2014  . Nausea & vomiting 08/27/2014  . Anemia 02/06/2014  . Perioral numbness 10/19/2013  . Headache(784.0) 10/12/2013  . Chronic diastolic heart failure ([place])  10/12/2013  . Atelectasis 10/12/2013  . Pulmonary hypertension ([place]) 10/12/2013  . Cephalalgia   . Dizziness 10/11/2013  . Headache 10/11/2013  . S/P AVR (aortic valve replacement) 11/19/2012  . Abdominal pain, periumbilic 56/21/3086  . 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 Calculus of bile duct without mention of cholecystitis or obstruction 08/10/2012  . Chronic gastritis 07/16/2012  . Nausea alone 05/21/2012  . Abdominal pain, epigastric 05/21/2012  . Cellulitis of foot 01/01/2012  . Dysphagia, unspecified(787.20) 12/26/2011  . Esophageal reflux 12/26/2011  . Dyspnea on exertion 11/18/2011  . Chronic atrial fibrillation ([place]) 11/07/2011  . Chest pain 06/06/2011  . Aortic stenosis 03/24/2011  . Hypertension 03/24/2011  . H/O aortic valve replacement 10/02/2003    Past Surgical History:  Procedure Laterality Date  . ABDOMINAL HYSTERECTOMY  1967  . AORTIC VALVE REPLACEMENT  09-29-2003    [facility] pericardial tissue valve  . APPENDECTOMY  1933  . CARDIAC CATHETERIZATION  07-15-2003 DR HELEN PRESTON   MODERATE TO MODERATELY SEVERE CALCIFIC AORTIC STENOSIS/  NORMAL CORONARY ARTERIES  . CARDIOVASCULAR STRESS TEST  01-30-2012  DR NASHER   NORMAL NUCLEAR STUDY/  EF 73%/  NORMAL LVF  . CARPAL TUNNEL RELEASE Bilateral   . CATARACT EXTRACTION W/ INTRAOCULAR LENS  IMPLANT, BILATERAL    . CHOLECYSTECTOMY  1993  . CYSTO WITH  HYDRODISTENSION N/A 02/05/2013   Procedure: CYSTOSCOPY/HYDRODISTENSION;  Surgeon: Irine Seal, MD;  Location: [facility];  Service: Urology;  Laterality: N/A;  . CYSTO/ HYDRODISTENTION/ INSTILLATION CLORPACTIN  MULTIPLE  last one 2009  . DILATION AND CURETTAGE OF UTERUS    . ERCP N/A 08/10/2012   Procedure: ENDOSCOPIC RETROGRADE CHOLANGIOPANCREATOGRAPHY (ERCP);  Surgeon: Inda Castle, MD;  Location: [facility];  Service: Gastroenterology;  Laterality: N/A;  . ESOPHAGOGASTRODUODENOSCOPY (EGD) WITH PROPOFOL N/A 08/18/2014   Procedure:  ESOPHAGOGASTRODUODENOSCOPY (EGD) WITH PROPOFOL;  Surgeon: Inda Castle, MD;  Location: [facility];  Service: Endoscopy;  Laterality: N/A;  . LEFT HEART CATHETERIZATION WITH CORONARY ANGIOGRAM N/A 10/18/2013   Procedure: LEFT HEART CATHETERIZATION WITH CORONARY ANGIOGRAM;  Surgeon: Jettie Booze, MD;  Location: [facility] CATH LAB;  Service: Cardiovascular;  Laterality: N/A;  . LUMBAR [place] &  2007  . MACROPLASTIQUE URETHRAL IMPLANTATION  08-27-2009  . MINI-OPEN RIGHT ROTATOR CUFF REPAIR  07-12-2001  . RIGHT SHOULDER ARTHROSCOPY W/ DEBRIDEMENT ROTATOR CUFF AND LABRAL TEAR/ ACROMINOPLASTY/ DISTAL CLAVICLE EXCISION/ CA LIGAMENT RELEASE  10-08-1999  . TONSILLECTOMY AND ADENOIDECTOMY    . TRANSTHORACIC ECHOCARDIOGRAM  08-10-2011   MILD LVH/  EF 55-60%/  NORMAL AVR TISSUE/ MILD MR/  MODERATE DILATED LA/  MILD DILATED RA  . TRANSVAGINAL TAPE PROCEDURE  07-30-2002  . VEIN LIGATION  1969   RIGHT LOWER LEG    OB History    No data available       Home Medications    Prior to Admission medications   Medication Sig Start Date End Date Taking? Authorizing Provider  atorvastatin (LIPITOR) 20 MG tablet Take 20 mg by mouth daily.   Yes [provider]  busPIRone (BUSPAR) 15 MG tablet Take 15 mg by mouth 2 (two) times daily. 03/19/17  Yes [provider]  cycloSPORINE (RESTASIS) 0.05 % ophthalmic emulsion Place 1 drop into both eyes 2 (two) times daily.   Yes [provider]  diazepam (VALIUM) 5 MG tablet Take 5 mg by mouth at bedtime as needed (sleep).    Yes [provider]  diltiazem (CARDIZEM) 120 MG tablet Take 120 mg by mouth 2 (two) times daily.   Yes [provider]  furosemide (LASIX) 40 MG tablet TAKE ONE TABLET BY MOUTH DAILY Patient taking differently: TAKE ONE TABLET (40 MG) BY MOUTH DAILY AS NEEDED FOR WEIGHT GAIN OF 2 LBS IN 24 HOURS 10/07/16  Yes Nahser, Wonda Cheng, MD  HYDROcodone-acetaminophen (NORCO/VICODIN) 5-325 MG per tablet  Take 1 tablet by mouth every 6 (six) hours as needed (pain).    Yes [provider]  hyoscyamine (LEVSIN, ANASPAZ) 0.125 MG tablet Take 0.125 mg by mouth every 4 (four) hours as needed for cramping.    Yes [provider]  ipratropium (ATROVENT) 0.06 % nasal spray Place 2 sprays into both nostrils 2 (two) times daily.    Yes [provider]  lisinopril (PRINIVIL,ZESTRIL) 40 MG tablet Take 20 mg by mouth daily.    Yes [provider]  Multiple Vitamins-Minerals (ICAPS AREDS 2) CAPS Take 1 capsule by mouth 2 (two) times daily.   Yes [provider]  ondansetron (ZOFRAN) 4 MG tablet Take 1 tablet (4 mg total) by mouth every 8 (eight) hours as needed for nausea. Patient taking differently: Take 4 mg by mouth every 8 (eight) hours as needed for nausea or vomiting.  03/23/17  Yes Armbruster, Carlota Raspberry, MD  oxybutynin (DITROPAN) 5 MG tablet Take 5-10 mg  by mouth See admin instructions. Take 1 tablet (5 mg) by mouth every morning and 2 tablets (10 mg) at night   Yes [provider]  pantoprazole (PROTONIX) 40 MG tablet Take 1 tablet (40 mg total) by mouth 2 (two) times daily. Patient taking differently: Take 40 mg by mouth daily.  09/09/16  Yes Armbruster, Carlota Raspberry, MD  Polyvinyl Alcohol-Povidone (REFRESH OP) Place 1 drop into both eyes 2 (two) times daily.   Yes [provider]  potassium chloride (K-DUR) 10 MEQ tablet TAKE ONE TABLET BY MOUTH DAILY Patient taking differently: TAKE ONE TABLET (10 MEQ) BY MOUTH WITH EVERY DOSE OF LASIX (FUROSEMIDE) 01/04/17  Yes Nahser, Wonda Cheng, MD  promethazine (PHENERGAN) 12.5 MG tablet Take 1 tablet (12.5 mg total) by mouth every 6 (six) hours as needed for nausea or vomiting. 08/30/14  Yes Ghimire, Henreitta Leber, MD  ranitidine (ZANTAC) 150 MG tablet Take 150 mg by mouth at bedtime.    Yes [provider]  warfarin (COUMADIN) 5 MG tablet TAKE ONE TABLET BY MOUTH DAILY OR AS DIRECTED BY COUMADIN  CLINIC Patient taking differently: TAKE 1/2 TABLET (2.5 MG) BY MOUTH ON TUESDAY AND SATURDAY EVENING, TAKE 1 TABLET (5 MG) ON ALL OTHER NIGHTS OF THE WEEK 12/09/16  Yes Nahser, Wonda Cheng, MD  ondansetron (ZOFRAN-ODT) 8 MG disintegrating tablet DISSOLVE ONE TABLET IN MOUTH EVERY 8 HOURS AS NEEDED FOR NAUSEA OR VOMITING Patient not taking: Reported on 04/03/2017 01/17/17   Armbruster, Carlota Raspberry, MD    Family History Family History  Problem Relation Age of Onset  . Heart disease Father   . Stomach cancer Maternal Grandmother   . Esophageal cancer Maternal Grandfather   . Bipolar disorder Son        Committed Suicide  . Coronary artery disease Brother   . Bipolar disorder Brother        commited suiside at 42yo  . Alcohol abuse Sister        sister #1  . Alcohol abuse Sister        sister #2  . Colon cancer Maternal Aunt   . Lung disease Neg Hx     Social History Social History   Tobacco Use  . Smoking status: Former Smoker    Packs/day: 0.50    Years: 40.00    Pack years: 20.00    Types: Cigarettes    Start date: 05/06/1949    Last attempt to quit: 03/06/1990    Years since quitting: 27.0  . Smokeless tobacco: Never Used  Substance Use Topics  . Alcohol use: No    Alcohol/week: 0.0 oz    Comment: Remote social EtOH  . Drug use: No     Allergies   Amlodipine; Amoxicillin; Carafate [sucralfate]; Cephalexin; Codeine; Irbesartan; Morphine and related; Neurontin [gabapentin]; Nitrofurantoin monohyd macro; Prednisone; and Sulfa antibiotics   Review of Systems Review of Systems  Constitutional: Negative for appetite change, chills, diaphoresis, fatigue and fever.  HENT: Negative for mouth sores, sore throat and trouble swallowing.        Left posterior scalp laceration  Eyes: Negative for visual disturbance.  Respiratory: Negative for cough, chest tightness, shortness of breath and wheezing.   Cardiovascular: Negative for chest pain.       Pain and tenderness in left chest.   Gastrointestinal: Negative for abdominal distention, abdominal pain, diarrhea, nausea and vomiting.  Endocrine: Negative for polydipsia, polyphagia and polyuria.  Genitourinary: Negative for dysuria, frequency and hematuria.  Musculoskeletal: Negative for gait problem.  Skin: Negative for color change, pallor and rash.  Neurological: Negative for dizziness, syncope, light-headedness and headaches.  Hematological: Does not bruise/bleed easily.  Psychiatric/Behavioral: Negative for behavioral problems and confusion.     Physical Exam Updated Vital Signs BP (!) 166/67 (BP Location: Right Arm)   Pulse 63   Temp 97.7 F (36.5 C) (Oral)   Resp 18   Ht 5\' 1"  (1.549 m)   Wt 60.8 kg (134 lb)   SpO2 94%   BMI 25.32 kg/m   Physical Exam  Constitutional: She is oriented to person, place, and time. She appears well-developed and well-nourished. No distress.  HENT:  Head: Normocephalic.  3 cm vertically oriented midline posterior occipital scalp laceration  Eyes: Conjunctivae are normal. Pupils are equal, round, and reactive to light. No scleral icterus.  Neck: Normal range of motion. Neck supple. No thyromegaly present.  Cardiovascular: Normal rate and regular rhythm. Exam reveals no gallop and no friction rub.  No murmur heard. Pulmonary/Chest: Effort normal and breath sounds normal. No respiratory distress. She has no wheezes. She has no rales.  Left ribs.  No crepitus.  No subcu air.  Symmetric breath sounds.  Abdominal: Soft. Bowel sounds are normal. She exhibits no distension. There is no tenderness. There is no rebound.  Musculoskeletal: Normal range of motion.  Neurological: She is alert and oriented to person, place, and time.  Skin: Skin is warm and dry. No rash noted.  Psychiatric: She has a normal mood and affect. Her behavior is normal.     ED Treatments / Results  Labs (all labs ordered are listed, but only abnormal results are displayed) Labs Reviewed - No data to  display  EKG  EKG Interpretation None       Radiology Dg Ribs Unilateral W/chest Left  Result Date: 04/03/2017 CLINICAL DATA:  83 year old who fell earlier today and complains of left anterior rib pain. Initial encounter. EXAM: LEFT RIBS AND CHEST - 3+ VIEW COMPARISON:  CT chest 11/17/2015, 06/03/2015 and earlier. Chest x-rays 10/31/2014, 07/15/2014 and earlier. FINDINGS: The site of maximum pain and tenderness was marked with a metallic BB. No fractures identified involving the left ribs. Mild osseous demineralization is suspected. No other intrinsic osseous abnormalities. Prior sternotomy for aortic valve replacement. Cardiac silhouette moderately enlarged, unchanged. Thoracic aorta tortuous and atherosclerotic, unchanged. Hilar and mediastinal contours otherwise unremarkable. Lungs clear. Bronchovascular markings normal. Pulmonary vascularity normal. No visible pleural effusions. No pneumothorax. IMPRESSION: 1. No left rib fractures identified. 2. Stable moderate cardiomegaly.  No acute cardiopulmonary disease. 3.  Aortic Atherosclerosis (ICD10-170.0) Electronically Signed   By: Evangeline Dakin M.D.   On: 04/03/2017 17:09   Ct Head Wo Contrast  Result Date: 04/03/2017 CLINICAL DATA:  Fall with pain to the left side of head EXAM: CT HEAD WITHOUT CONTRAST TECHNIQUE: Contiguous axial images were obtained from the base of the skull through the vertex without intravenous contrast. COMPARISON:  10/07/2014 FINDINGS: Brain: No acute territorial infarction, hemorrhage or intracranial mass is visualized. Old left occipital infarct. Extensive small vessel ischemic changes of the white matter. Mild moderate atrophy. Stable ventricle size. Vascular: No hyperdense vessels.  Carotid artery calcification. Skull: No fracture Sinuses/Orbits: No acute finding. Other: Small left parietal scalp hematoma IMPRESSION: 1. No CT evidence for acute intracranial abnormality. 2. Atrophy with extensive small vessel ischemic  changes of the white matter and evidence of old left occipital infarct. 3. Small moderate left parietal scalp hematoma Electronically Signed   By: Madie Reno.D.  On: 04/03/2017 16:51    Procedures Procedures (including critical care time)  Medications Ordered in ED Medications  lidocaine-EPINEPHrine (XYLOCAINE W/EPI) 2 %-1:200000 (PF) injection 10 mL (10 mLs Infiltration Given 04/03/17 1750)  HYDROcodone-acetaminophen (NORCO/VICODIN) 5-325 MG per tablet 1 tablet (1 tablet Oral Given 04/03/17 1750)     Initial Impression / Assessment and Plan / ED Course  I have reviewed the triage vital signs and the nursing notes.  Pertinent labs & imaging results that were available during my care of the patient were reviewed by me and considered in my medical decision making (see chart for details).     LACERATION REPAIR Performed by: Lolita Patella Authorized by: Lolita Patella Consent: Verbal consent obtained. Risks and benefits: risks, benefits and alternatives were discussed Consent given by: patient Patient identity confirmed: provided demographic data Prepped and Draped in normal sterile fashion Wound explored  Laceration Location: Lt occipital  Laceration Length: 3cm  No Foreign Bodies seen or palpated  Anesthesia: local infiltration  Local anesthetic: lidocaine 1% c epinephrine  Anesthetic total: 3 ml  Irrigation method: syringe Amount of cleaning: standard  Skin closure: 4 staples  Number of sutures: 4 staples  Technique: Staples  Patient tolerance: Patient tolerated the procedure well with no immediate complications.   Final Clinical Impressions(s) / ED Diagnoses   Final diagnoses:  Laceration of scalp, initial encounter  Contusion of left chest wall, initial encounter     Chest x-ray without fracture, contusion. Not hypoxemic. Given hydrocodone which he takes this once daily for chronic pain did well. Laceration repair to 4 sentences staples as  above. Discharged home. Vicodin. Ice pack to the scalp tonight. Suture removal 7 days. ER with acute changes.  ED Discharge Orders    None       Tanna Furry, MD 04/03/17 Noel Gerold, MD 05/13/17 [phone]

## 2017-04-03 NOTE — ED Triage Notes (Signed)
Patient arrived via [place], states that she was leaving her nail salon when she "missed a step" and fell. PT reports pain to left side of head, blood stain observed on that side of head, PT reports pain to Left side chest pain, she is A & O X4. Daughter is at her bedside.

## 2017-04-05 ENCOUNTER — Ambulatory Visit (INDEPENDENT_AMBULATORY_CARE_PROVIDER_SITE_OTHER): Payer: Medicare Other | Admitting: *Deleted

## 2017-04-05 DIAGNOSIS — I214 Non-ST elevation (NSTEMI) myocardial infarction: Secondary | ICD-10-CM | POA: Diagnosis not present

## 2017-04-05 DIAGNOSIS — Z5181 Encounter for therapeutic drug level monitoring: Secondary | ICD-10-CM

## 2017-04-05 LAB — POCT INR: INR: 3.7

## 2017-04-05 NOTE — Patient Instructions (Signed)
Description   Skip today's dose, then Continue taking 1 tablet daily except 1/2 tablet on Tuesdays and Saturdays. Recheck in 2 weeks.  Call if any changes [phone]

## 2017-04-06 ENCOUNTER — Other Ambulatory Visit: Payer: Self-pay | Admitting: Gastroenterology

## 2017-04-07 ENCOUNTER — Other Ambulatory Visit: Payer: Self-pay

## 2017-04-12 DIAGNOSIS — Z4802 Encounter for removal of sutures: Secondary | ICD-10-CM | POA: Diagnosis not present

## 2017-04-12 DIAGNOSIS — S0101XA Laceration without foreign body of scalp, initial encounter: Secondary | ICD-10-CM | POA: Diagnosis not present

## 2017-04-17 ENCOUNTER — Telehealth: Payer: Self-pay | Admitting: Gastroenterology

## 2017-04-17 NOTE — Telephone Encounter (Signed)
Yes that's fine. Thanks  

## 2017-04-17 NOTE — Telephone Encounter (Signed)
Refill request for Zorfan 4 mg 1 every 8 hrs as needed for nausea and vomiting. Last seen 03-16-2018

## 2017-04-18 ENCOUNTER — Other Ambulatory Visit: Payer: Self-pay | Admitting: Cardiovascular Disease

## 2017-04-18 ENCOUNTER — Other Ambulatory Visit: Payer: Self-pay

## 2017-04-18 MED ORDER — ONDANSETRON HCL 4 MG PO TABS: 4.0000 mg | ORAL_TABLET | Freq: Three times a day (TID) | ORAL | 3 refills | Status: DC | PRN

## 2017-04-18 NOTE — Telephone Encounter (Signed)
Refilled as directed.  

## 2017-04-20 ENCOUNTER — Ambulatory Visit (INDEPENDENT_AMBULATORY_CARE_PROVIDER_SITE_OTHER): Payer: Medicare Other

## 2017-04-20 DIAGNOSIS — Z5181 Encounter for therapeutic drug level monitoring: Secondary | ICD-10-CM

## 2017-04-20 DIAGNOSIS — I214 Non-ST elevation (NSTEMI) myocardial infarction: Secondary | ICD-10-CM

## 2017-04-20 LAB — POCT INR: INR: 3.2

## 2017-04-20 NOTE — Patient Instructions (Signed)
Description   Start taking 1 tablet daily except 1/2 tablet on Tuesdays, Thursdays, and Saturdays. Recheck in 3 weeks.  Call if any changes [phone]

## 2017-04-24 DIAGNOSIS — N3000 Acute cystitis without hematuria: Secondary | ICD-10-CM | POA: Diagnosis not present

## 2017-04-24 DIAGNOSIS — R109 Unspecified abdominal pain: Secondary | ICD-10-CM | POA: Diagnosis not present

## 2017-05-04 DIAGNOSIS — M48061 Spinal stenosis, lumbar region without neurogenic claudication: Secondary | ICD-10-CM | POA: Diagnosis not present

## 2017-05-04 DIAGNOSIS — M5416 Radiculopathy, lumbar region: Secondary | ICD-10-CM | POA: Diagnosis not present

## 2017-05-11 ENCOUNTER — Ambulatory Visit (INDEPENDENT_AMBULATORY_CARE_PROVIDER_SITE_OTHER): Payer: Medicare Other

## 2017-05-11 DIAGNOSIS — I214 Non-ST elevation (NSTEMI) myocardial infarction: Secondary | ICD-10-CM | POA: Diagnosis not present

## 2017-05-11 DIAGNOSIS — Z5181 Encounter for therapeutic drug level monitoring: Secondary | ICD-10-CM

## 2017-05-11 LAB — POCT INR: INR: 1.9

## 2017-05-11 NOTE — Patient Instructions (Signed)
Description    Take 1 tablet today, then resume same dosage 1 tablet daily except 1/2 tablet on Tuesdays, Thursdays, and Saturdays. Recheck in 2 weeks.  Call if any changes [phone]

## 2017-05-17 ENCOUNTER — Ambulatory Visit: Payer: Medicare Other | Admitting: Emergency Medicine

## 2017-05-25 ENCOUNTER — Ambulatory Visit (INDEPENDENT_AMBULATORY_CARE_PROVIDER_SITE_OTHER): Payer: Medicare Other | Admitting: *Deleted

## 2017-05-25 DIAGNOSIS — I214 Non-ST elevation (NSTEMI) myocardial infarction: Secondary | ICD-10-CM | POA: Diagnosis not present

## 2017-05-25 DIAGNOSIS — Z5181 Encounter for therapeutic drug level monitoring: Secondary | ICD-10-CM | POA: Diagnosis not present

## 2017-05-25 LAB — POCT INR: INR: 2

## 2017-05-25 NOTE — Patient Instructions (Signed)
Description   Take 1 tablet today, then resume same dosage 1 tablet daily except 1/2 tablet on Tuesdays, Thursdays, and Saturdays. Recheck in 3 weeks. Call if any changes [phone]

## 2017-06-01 DIAGNOSIS — J3489 Other specified disorders of nose and nasal sinuses: Secondary | ICD-10-CM | POA: Diagnosis not present

## 2017-06-06 ENCOUNTER — Ambulatory Visit (INDEPENDENT_AMBULATORY_CARE_PROVIDER_SITE_OTHER): Payer: Medicare Other | Admitting: Emergency Medicine

## 2017-06-06 ENCOUNTER — Encounter: Payer: Self-pay | Admitting: Emergency Medicine

## 2017-06-06 DIAGNOSIS — J301 Allergic rhinitis due to pollen: Secondary | ICD-10-CM | POA: Diagnosis not present

## 2017-06-06 DIAGNOSIS — R590 Localized enlarged lymph nodes: Secondary | ICD-10-CM

## 2017-06-06 DIAGNOSIS — I214 Non-ST elevation (NSTEMI) myocardial infarction: Secondary | ICD-10-CM

## 2017-06-06 DIAGNOSIS — J432 Centrilobular emphysema: Secondary | ICD-10-CM | POA: Diagnosis not present

## 2017-06-06 DIAGNOSIS — I5032 Chronic diastolic (congestive) heart failure: Secondary | ICD-10-CM

## 2017-06-06 DIAGNOSIS — J309 Allergic rhinitis, unspecified: Secondary | ICD-10-CM | POA: Insufficient documentation

## 2017-06-06 NOTE — Assessment & Plan Note (Signed)
Defer any repeat imaging at this time given her overall clinical stability

## 2017-06-06 NOTE — Patient Instructions (Signed)
Please continue to use your Atrovent nasal spray as needed to treat your nasal congestion. We will not start any new inhaled medications at this time. Follow with Dr Acie Fredrickson as planned.  Follow with Dr Lamonte Sakai in 12 months or sooner if you have any problems

## 2017-06-06 NOTE — Progress Notes (Signed)
Subjective:    Patient ID: Brittany [name], female    DOB: 09-28-1927, 84 y.o.   MRN: [id number]  HPI 84 year old former smoker who is been followed by Dr. Ashok Cordia in our office for COPD/emphysema, secondary pulmonary hypertension (due to lung disease and left-sided heart disease), chronic allergic rhinitis, GERD.  She is also been noted to have stable mediastinal lymphadenopathy on serial CT scans of the chest, last was done 11/17/15.     Her PFT from 2016 are almost normal, maybe some evidence for mild obstruction on curve.  She is not currently on any BD's. She is able to walk without dyspnea. She does get SOB if she has edema - responds to lasix.                  She is on atrovent NS for chronic rhinitis.                                                                                                                                       Review of Systems  Past Medical History:  Diagnosis Date  . Arthritis   . Atrial fibrillation ([place])   . Chronic back pain   . Diverticulosis   . Dyslipidemia    takes Lipitor daily  . Dysrhythmia    Atrial Fibrillation  . Foot drop    SINCE 1987  . GERD (gastroesophageal reflux disease)   . H/O hiatal hernia   . Hearing impaired    Bilateral hearing aids  . History of bacterial endocarditis   . History of gastritis   . History of stomach ulcers    many yrs ago  . History of TIA (transient ischemic attack)    2013-- RESIDUAL PERIPHERAL VISION RIGHT EYE--  RESOLVED  . Hypertension   . IC (interstitial cystitis)   . Pulmonary hypertension ([place]) 10/2013   Based on TTE  . S/P aortic valve replacement    2005  . Urine incontinence      Family History  Problem Relation Age of Onset  . Heart disease Father   . Stomach cancer Maternal Grandmother   . Esophageal cancer Maternal Grandfather   . Bipolar disorder Son        Committed Suicide  . Coronary artery disease Brother   . Bipolar disorder Brother        commited suiside at 50yo  .  Alcohol abuse Sister        sister #1  . Alcohol abuse Sister        sister #2  . Colon cancer Maternal Aunt   . Lung disease Neg Hx      Social History   Socioeconomic History  . Marital status: Widowed    Spouse name: Not on file  . Number of children: 4  . Years of education: Not on file  . Highest education level: Not on file  Social Needs  .  Financial resource strain: Not on file  . Food insecurity - worry: Not on file  . Food insecurity - inability: Not on file  . Transportation needs - medical: Not on file  . Transportation needs - non-medical: Not on file  Occupational History  . Occupation: retired  Tobacco Use  . Smoking status: Former Smoker    Packs/day: 0.50    Years: 40.00    Pack years: 20.00    Types: Cigarettes    Start date: 05/06/1949    Last attempt to quit: 03/06/1990    Years since quitting: 27.2  . Smokeless tobacco: Never Used  Substance and Sexual Activity  . Alcohol use: No    Alcohol/week: 0.0 oz    Comment: Remote social EtOH  . Drug use: No  . Sexual activity: No  Other Topics Concern  . Not on file  Social History Narrative   She is originally from [place], [place]. She has previously lived in [place] for 10 years. She has traveled to all 21 states as well as every [place] in [place]. Prior travel to [place], [place], [place]. Previously worked as an Optometrist. Remote exposure to a parakeet for 2 years in [place]. No known asbestos, mold, or hot tub exposure. She enjoys doing needle point & hand crafts.       Allergies  Allergen Reactions  . Amlodipine Swelling and Other (See Comments)     Unspecified swelling reaction  . Amoxicillin Diarrhea, Nausea And Vomiting and Other (See Comments)    Has patient had a PCN reaction causing immediate rash, facial/tongue/throat swelling, SOB or lightheadedness with hypotension: No Has patient had a PCN reaction causing severe rash involving mucus membranes or skin necrosis: No Has patient had a PCN reaction that  required hospitalization: No Has patient had a PCN reaction occurring within the last 10 years: No If all of the above answers are "NO", then may proceed with Cephalosporin use.  . Carafate [Sucralfate] Swelling and Other (See Comments)    Reaction:  Knee swelling/redness   . Cephalexin Diarrhea  . Codeine Nausea And Vomiting  . Irbesartan Swelling and Other (See Comments)    Reaction:  Facial/hand swelling and numbness   . Morphine And Related Other (See Comments)    Pt states that she has a history of addiction with this medication.      . Neurontin [Gabapentin] Swelling and Other (See Comments)    Reaction:  Leg swelling   . Nitrofurantoin Monohyd Macro Diarrhea and Nausea And Vomiting  . Prednisone Other (See Comments)    Reaction:  Elevated BP  . Sulfa Antibiotics Rash     Outpatient Medications Prior to Visit  Medication Sig Dispense Refill  . atorvastatin (LIPITOR) 20 MG tablet Take 20 mg by mouth daily.    . busPIRone (BUSPAR) 15 MG tablet Take 15 mg by mouth 2 (two) times daily.    . cycloSPORINE (RESTASIS) 0.05 % ophthalmic emulsion Place 1 drop into both eyes 2 (two) times daily.    . diazepam (VALIUM) 5 MG tablet Take 5 mg by mouth at bedtime as needed (sleep).     Marland Kitchen diltiazem (CARDIZEM) 120 MG tablet Take 120 mg by mouth 2 (two) times daily.    . furosemide (LASIX) 40 MG tablet TAKE ONE TABLET BY MOUTH DAILY (Patient taking differently: TAKE ONE TABLET (40 MG) BY MOUTH DAILY AS NEEDED FOR WEIGHT GAIN OF 2 LBS IN 24 HOURS) 90 tablet 2  . HYDROcodone-acetaminophen (NORCO/VICODIN) 5-325 MG per tablet Take  1 tablet by mouth every 6 (six) hours as needed (pain).     . hyoscyamine (LEVSIN, ANASPAZ) 0.125 MG tablet Take 0.125 mg by mouth every 4 (four) hours as needed for cramping.     Marland Kitchen ipratropium (ATROVENT) 0.06 % nasal spray Place 2 sprays into both nostrils 2 (two) times daily.     Marland Kitchen lisinopril (PRINIVIL,ZESTRIL) 40 MG tablet Take 20 mg by mouth daily.     . Multiple  Vitamins-Minerals (ICAPS AREDS 2) CAPS Take 1 capsule by mouth 2 (two) times daily.    . ondansetron (ZOFRAN) 4 MG tablet Take 1 tablet (4 mg total) by mouth every 8 (eight) hours as needed for nausea. 60 tablet 3  . oxybutynin (DITROPAN) 5 MG tablet Take 5-10 mg by mouth See admin instructions. Take 1 tablet (5 mg) by mouth every morning and 2 tablets (10 mg) at night    . pantoprazole (PROTONIX) 40 MG tablet Take 1 tablet (40 mg total) by mouth 2 (two) times daily. (Patient taking differently: Take 40 mg by mouth daily. ) 180 tablet 3  . Polyvinyl Alcohol-Povidone (REFRESH OP) Place 1 drop into both eyes 2 (two) times daily.    . potassium chloride (K-DUR) 10 MEQ tablet TAKE ONE TABLET BY MOUTH DAILY (Patient taking differently: TAKE ONE TABLET (10 MEQ) BY MOUTH WITH EVERY DOSE OF LASIX (FUROSEMIDE)) 90 tablet 2  . promethazine (PHENERGAN) 12.5 MG tablet Take 1 tablet (12.5 mg total) by mouth every 6 (six) hours as needed for nausea or vomiting. 30 tablet 0  . ranitidine (ZANTAC) 150 MG tablet Take 150 mg by mouth at bedtime.     Marland Kitchen warfarin (COUMADIN) 5 MG tablet Take as directed by coumadin clinic 30 tablet 3   No facility-administered medications prior to visit.         Objective:   Physical Exam Vitals:   06/06/17 1545  BP: 140/70  Pulse: (!) 56  SpO2: 98%  Weight: 130 lb (59 kg)  Height: 5\' 1"  (1.549 m)   Gen: Pleasant, elderly woman, well-nourished, in no distress,  normal affect  ENT: No lesions,  mouth clear,  oropharynx clear, no postnasal drip  Neck: No JVD, no stridor  Lungs: No use of accessory muscles, clear without rales or rhonchi  Cardiovascular: RRR, soft holosyst M with a loud S2.   Musculoskeletal: No deformities, no cyanosis or clubbing  Neuro: alert, non focal  Skin: Warm, no lesions or rash       Assessment & Plan:  Emphysema lung If she has COPD then it is quite mild based on spirometry.  She is doing well off of bronchodilators.  I will not start  any new inhaled medication today  Chronic diastolic heart failure ([place]) She relates her degree of edema and volume overload, weight to any dyspnea.  None present currently.  She uses Lasix as needed  Allergic rhinitis She appears to be benefiting from Atrovent nasal spray we will continue this.  Mediastinal lymphadenopathy Defer any repeat imaging at this time given her overall clinical stability  Baltazar Apo, MD, PhD 06/06/2017, 4:16 PM [facility] [phone] or if no answer [phone]

## 2017-06-06 NOTE — Assessment & Plan Note (Signed)
She appears to be benefiting from Atrovent nasal spray we will continue this.

## 2017-06-06 NOTE — Assessment & Plan Note (Signed)
She relates her degree of edema and volume overload, weight to any dyspnea.  None present currently.  She uses Lasix as needed

## 2017-06-06 NOTE — Assessment & Plan Note (Signed)
If she has COPD then it is quite mild based on spirometry.  She is doing well off of bronchodilators.  I will not start any new inhaled medication today

## 2017-06-07 ENCOUNTER — Telehealth: Payer: Self-pay | Admitting: Cardiovascular Disease

## 2017-06-07 DIAGNOSIS — M7061 Trochanteric bursitis, right hip: Secondary | ICD-10-CM | POA: Diagnosis not present

## 2017-06-07 NOTE — Telephone Encounter (Signed)
New message     Dr West Carbo found out that she has a murmur ([facility]) does she need to come in earlier or can she wait until April appt .   Pt c/o swelling: STAT is pt has developed SOB within 24 hours  1) How much weight have you gained and in what time span?  no  2) If swelling, where is the swelling located? Rt leg  3) Are you currently taking a fluid pill? yes  4) Are you currently SOB? no  5) Do you have a log of your daily weights (if so, list)? no  6) Have you gained 3 pounds in a day or 5 pounds in a week? no  7) Have you traveled recently? no

## 2017-06-07 NOTE — Telephone Encounter (Signed)
Spoke with patient and advised her that the murmur heard by Dr. Lamonte Sakai was likely her mechanical aorta valve. She states she has had the valve since 2005 and wants to make certain it is still working appropriately since she was told it would last 10 years. Her last echo was 2015. She states she is also calling to report that her right leg is "puffy." She states she weighs herself daily and denies increase in weight; denies pain, states it feels "tight."  States she has taken lasix 40 mg daily x 3 days and will continue until the swelling decreases. States the leg seems to be remaining the same since she started the daily lasix. She denies SOB, denies recent travel or surgery. She states the leg is not cold to touch and denies discoloration. She states she will elevate her leg when sitting and can also do so in the bed because her leg has controls that will allow elevation. I advised that I will send message to Dr. Acie Fredrickson and that if he has any additional advice, I will call her back. She verbalized understanding and thanked me for the call.

## 2017-06-07 NOTE — Telephone Encounter (Signed)
I agree with leg elevation. Continue Lasix as she is doing for now Please have her call back if her condition persists long term or worsens

## 2017-06-09 NOTE — Telephone Encounter (Signed)
Spoke with patient to ask if leg swelling has improved and she reports that it has. She thanked me for the call.

## 2017-06-12 DIAGNOSIS — N3946 Mixed incontinence: Secondary | ICD-10-CM | POA: Diagnosis not present

## 2017-06-12 DIAGNOSIS — R351 Nocturia: Secondary | ICD-10-CM | POA: Diagnosis not present

## 2017-06-12 DIAGNOSIS — N3010 Interstitial cystitis (chronic) without hematuria: Secondary | ICD-10-CM | POA: Diagnosis not present

## 2017-06-12 DIAGNOSIS — R3121 Asymptomatic microscopic hematuria: Secondary | ICD-10-CM | POA: Diagnosis not present

## 2017-06-14 ENCOUNTER — Telehealth: Payer: Self-pay | Admitting: Cardiovascular Disease

## 2017-06-14 NOTE — Telephone Encounter (Signed)
New Message   Pt c/o BP issue:  1. What are your last 5 BP readings? 154/93, 160/86, 154/86, 130/75, 101/52 2. Are you having any other symptoms (ex. Dizziness, headache, blurred vision, passed out)? No symptoms 3. What is your medication issue? None     Patient states that earlier this week she went to another doctor and it was 180/?Marland Kitchen

## 2017-06-14 NOTE — Telephone Encounter (Signed)
Talked with [name]. She is concerned with some unusually high blood pressure readings for her. She states has been taking her blood pressure every hour and it has been in the "140s-150s." I suggested to her to take it a few hours after she takes her morning BP medications and not to take them as often as this can cause an anxious rise in her BP readings. She stated she has a coumadin appt tomorrow and will ask them to take a BP reading when she comes in. I told her if it was high, we could address it with the DOD since Dr Acie Fredrickson will be out of the office. Additionally, she has a follow up appointment with him 4/29 but we could possibly move this up if necessary. I advised her if her BP continues to rise and is sustained in the 180's-190's, she should visit the ED or Urgent care so it can be treated. In the meantime, I will forward this to Dr Elmarie Shiley nurse if they would like to address it further.

## 2017-06-15 ENCOUNTER — Ambulatory Visit (INDEPENDENT_AMBULATORY_CARE_PROVIDER_SITE_OTHER): Payer: Medicare Other | Admitting: *Deleted

## 2017-06-15 DIAGNOSIS — Z5181 Encounter for therapeutic drug level monitoring: Secondary | ICD-10-CM

## 2017-06-15 DIAGNOSIS — I214 Non-ST elevation (NSTEMI) myocardial infarction: Secondary | ICD-10-CM | POA: Diagnosis not present

## 2017-06-15 LAB — POCT INR: INR: 2.4

## 2017-06-15 NOTE — Patient Instructions (Signed)
Description   Continue taking same dosage 1 tablet daily except 1/2 tablet on Tuesdays, Thursdays, and Saturdays. Recheck in 4 weeks. Call if any changes [phone]

## 2017-06-15 NOTE — Telephone Encounter (Signed)
Pt was instructed to have B/P taken today while in [facility] it was 134/73.

## 2017-06-15 NOTE — Telephone Encounter (Signed)
Agree with note by Dollene Primrose, RN This BP is not dangerously elevated.   I think she is making it worse by measuring it every hour. Have her keep her appt with me

## 2017-06-19 NOTE — Telephone Encounter (Signed)
BP looks good  

## 2017-06-21 ENCOUNTER — Ambulatory Visit: Payer: Medicare Other | Admitting: Cardiovascular Disease

## 2017-06-22 DIAGNOSIS — J3489 Other specified disorders of nose and nasal sinuses: Secondary | ICD-10-CM | POA: Diagnosis not present

## 2017-06-29 DIAGNOSIS — R109 Unspecified abdominal pain: Secondary | ICD-10-CM | POA: Diagnosis not present

## 2017-06-29 DIAGNOSIS — K529 Noninfective gastroenteritis and colitis, unspecified: Secondary | ICD-10-CM | POA: Diagnosis not present

## 2017-07-11 ENCOUNTER — Telehealth: Payer: Self-pay | Admitting: Cardiovascular Disease

## 2017-07-11 NOTE — Telephone Encounter (Signed)
Continue additional Lasix for 2-3  more days Elevated legs She needs to watch her salt

## 2017-07-11 NOTE — Telephone Encounter (Signed)
Pt c/o swelling: STAT is pt has developed SOB within 24 hours  1) How much weight have you gained and in what time span? 5 pounds in a week  If swelling, where is the swelling located? Both legs Are you currently taking a fluid pill? yes 2) Are you currently SOB? yes  3) Do you have a log of your daily weights (if so, list)? 138.  4) Have you gained 3 pounds in a day or 5 pounds in a week? 5 pounds in a week   5) Have you traveled recently? No

## 2017-07-11 NOTE — Telephone Encounter (Signed)
Pt called today she states that  normally her wt is 130 lbs. 3 days ago her wt was 140 lbs.  Pt took lasix 40 mg for 3 days.Today's wt is 138 lbs. Usually 3 days of lasix 40 mg would help to bring her wt down, but not this time. Pt states that her LE are swollen and tight. Pt denies SOB nor   other symptoms. Pt is aware that I will send this message to MD for recommendations.

## 2017-07-12 NOTE — Telephone Encounter (Signed)
Follow up   Pt calling to check on what Nahser decided to do about her Swelling, states someone was suppose to call her back but she hasn't heard anything. Please call

## 2017-07-12 NOTE — Telephone Encounter (Signed)
Called patient and apologized that she did not receive a call back yesterday and that I was off yesterday. Patient states she has not taken Lasix yesterday or today. Reports weight today is 138 lb. She denies SOB. I advised her to take Lasix 40 mg BID today and then once daily tomorrow and Friday. I advised her to take the [place] each time she takes Lasix. She states she has a coumadin clinic appointment tomorrow afternoon. I advised that I will see her in the office tomorrow to assess her leg swelling and to make certain the Lasix therapy is effective. She will likely need a BMET tomorrow. She verbalized understanding and agreement and thanked me for the call.

## 2017-07-13 ENCOUNTER — Encounter: Payer: Self-pay | Admitting: Nurse Practitioner

## 2017-07-13 ENCOUNTER — Ambulatory Visit (INDEPENDENT_AMBULATORY_CARE_PROVIDER_SITE_OTHER): Payer: Medicare Other | Admitting: *Deleted

## 2017-07-13 ENCOUNTER — Encounter: Payer: Self-pay | Admitting: Cardiovascular Disease

## 2017-07-13 ENCOUNTER — Ambulatory Visit (INDEPENDENT_AMBULATORY_CARE_PROVIDER_SITE_OTHER): Payer: Medicare Other | Admitting: Cardiovascular Disease

## 2017-07-13 VITALS — BP 118/56 | HR 62 | Ht 61.0 in | Wt 140.4 lb

## 2017-07-13 DIAGNOSIS — I4891 Unspecified atrial fibrillation: Secondary | ICD-10-CM | POA: Diagnosis not present

## 2017-07-13 DIAGNOSIS — I214 Non-ST elevation (NSTEMI) myocardial infarction: Secondary | ICD-10-CM | POA: Diagnosis not present

## 2017-07-13 DIAGNOSIS — M7989 Other specified soft tissue disorders: Secondary | ICD-10-CM | POA: Diagnosis not present

## 2017-07-13 DIAGNOSIS — I482 Chronic atrial fibrillation, unspecified: Secondary | ICD-10-CM

## 2017-07-13 DIAGNOSIS — Z5181 Encounter for therapeutic drug level monitoring: Secondary | ICD-10-CM

## 2017-07-13 DIAGNOSIS — I5033 Acute on chronic diastolic (congestive) heart failure: Secondary | ICD-10-CM | POA: Diagnosis not present

## 2017-07-13 LAB — POCT INR: INR: 2.3

## 2017-07-13 MED ORDER — POTASSIUM CHLORIDE CRYS ER 20 MEQ PO TBCR: 20.0000 meq | EXTENDED_RELEASE_TABLET | Freq: Every day | ORAL | 3 refills | Status: DC

## 2017-07-13 MED ORDER — TORSEMIDE 20 MG PO TABS: 20.0000 mg | ORAL_TABLET | Freq: Two times a day (BID) | ORAL | 3 refills | Status: DC

## 2017-07-13 NOTE — Progress Notes (Signed)
Cardiology Office Note   ID:  Brittany [name], Brittany [name] [birth year], MRN [id number]  PCP:  Mayra Neer, MD  Cardiologist:   Mertie Moores, MD   Chief Complaint  Patient presents with  . Congestive Heart Failure   1. Aortic valve replacement 2. Dyslipidemia 3. Hypertension 4. 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  5. Atrial fibrillation   Brittany [name] is an 84 yo with aortic valve replacement, HTN, and hyperlipidemia. She has not had any cardiac complaints recently. She complains of easy bruising on her arms.   She had an episode of severe heart burn this past week.   She's had a stroke over the past year. This has left her with some balance issues and some loss of peripheral vision.  Nov. 4, 2014:  Brittany [name] is doing well from a cardiac standpoint. She is having problems with interstitial cystitis. She had a procedure last week and had a small bladder tear which they are letting heal. She is still having some significant pain.   Aug 27, 2013:  Brittany [name] is doing OK. She is not having any problems . Still very active.  She had an AVR   She is having problems with interstitial cyctitis.   November 06, 2013:  Brittany [name] was admitted to the hospital twice on July , 2015: DC summary :  84 year old Caucasian female with past medical history significant for hypertension, hyperlipidemia, history of CVA permanent atrial fibrillation on Xarelto for the last 2 years, and history of aortic valve replacement with bioprosthetic valve in 2005, here with chest pain. She was recently admitted to the hospital for management of malignant HTN , diastolic HF and headache. She was seen by Dr Haroldine Laws and [place] on 10/16/2013. She returns within 24 hrs for symptoms of chest pain . Pt states that earlier in the afternoon while she was trying to sleep post lunch she started experiencing soreness , heaviness "someone sitting on my chest" on the left side radiating to left arm with  associated mild SOB. This improved with SL NTG , however, she continues to have some chest pain. Pt denied any orthopnea, PND , LE edema , syncope, claudication , palpitation etc . She reports medication compliance. She bruises easily in her skin and once episodes of epistaxis several months ago.  She was admitted. Xarelto was held in anticipation of coronary angiography. ASA, statin, BB, IV NTG and heparin added. She ruled in for NSTEMI with troponin of 0.63. Left heart cath revealed severe, fibrotic disease in the ostial right coronary artery which was the culprit lesion. This was successfully stented with a 2.5 x 14 resolute drug-eluting stent, postdilated to 3.3 mm in diameter. She was started on plavix, however, a P2Y12 came back at 289 so she was started on Brilinta. Cardizem cd 120 was added for afib RVR with improvement. Due to mild cough Lisinopril was changed to irbesartan. Xarelto was changed to coumadin and she will follow up two days after DC in the coumadin clinic   She now presents for follow up . She has some MSK pain ( hurts when she presses her chest. She has had some bleeding. She woke up in the morning with a small skin tear on her right ankle . There was lots of blood in the bed. The bleeding eventually stopped with pressure. She also has had a nose bleed.   She has not had any chest pain and her energy levels have been better. INR levels  have been OK.   Oct. 29, 2015:  Brittany [name] feeling better. She had a bad viral illness last week and thinks that she might have the flu. She is getting better.  She's had some problems with anemia and she is turned in 3 sets of guaiac cards. She has had some dyspnea and was started on Lasix on a QOD schedule.  She is now off Brilinta. She had a major bleeding from he right foot. She was on brilinta because She had an inadequate response to plavix originally but later we discovered that she was also on Omeprazole. We have  stopped the omeprazone and her PRU is better. She is taking Nexium instead.   Off Eliquis. Is back on warfarin. doing well     June 20, 2014:  Brittany [name] is a 84 y.o. female who presents for follow up .  She has had prolonged nausea and abdominal pain.  Worse with eating. No cardiac symptoms .   Sept. 8, 2016:    Doing well from a cardiac issue.  has been diagnosed with COPD ( was a former smoker, 25 years ago )  Still has some stomach issues.   Is still weak but is gradually improving  Having intermittant nose bleeds - is on Plavix and couamdin  June 18, 2015:  Doing ok from a cardiac standpoint Walks with the walker - has some leg edema .  Can use a cane when she is in her house Avoids eating salt-does not eat out much .     June 26, 29017:  Brittany [name] has been complaining of increasing shortness of breath / cough over the past several weeks.    We have backed off her diuretic because of the concern for renal insufficiency.   Her previous lasix dose was 20 mg on Mondays and Thursday.     Last week she called Sharyn Lull and we increased her Lasix.  40 mg a day for 2-3 days. She seems to be feeling quite a bit better.  She's lost 7 pounds. She is back close to normal .    Cough is better,   Still short of breath   Oct. 4, 2017:  Brittany [name] is seen back today  Still has shortness of breath, especially after she is rushing around .  Has had low salt level and her lasix was decreased and she has been limiting her free water intake.   Aug 09, 2016:  Brittany [name] is seen back for follow up for her CAD, HTN, atrial fib  and chronic diastolic CHF  No active cardiac issues  July 13, 2017:  Seen today as a work in visit . Has very swollen and erythematous legs .  She has had swollen legs for the past several weeks.  We have doubled her Lasix.  She did not notice any increase in urination with doubling of the Lasix. The legs have continued to become more more red and more  painful.  She was wearing High support hose.  She has a deep ring around the calf of her leg representing edema.  She takes Lasix on a PRN basis - based on weight  Does not put out much urine even when she takes the lasix   No CP or dyspnea.    She has gained 10 lbs of fluid over the past 3-4 weeks   Past Medical History:  Diagnosis Date  . Arthritis   . Atrial fibrillation ([place])   . Chronic back  pain   . Diverticulosis   . Dyslipidemia    takes Lipitor daily  . Dysrhythmia    Atrial Fibrillation  . Foot drop    SINCE 1987  . GERD (gastroesophageal reflux disease)   . H/O hiatal hernia   . Hearing impaired    Bilateral hearing aids  . History of bacterial endocarditis   . History of gastritis   . History of stomach ulcers    many yrs ago  . History of TIA (transient ischemic attack)    2013-- RESIDUAL PERIPHERAL VISION RIGHT EYE--  RESOLVED  . Hypertension   . IC (interstitial cystitis)   . Pulmonary hypertension ([place]) 10/2013   Based on TTE  . S/P aortic valve replacement    2005  . Urine incontinence     Past Surgical History:  Procedure Laterality Date  . ABDOMINAL HYSTERECTOMY  1967  . AORTIC VALVE REPLACEMENT  09-29-2003    [facility] pericardial tissue valve  . APPENDECTOMY  1933  . CARDIAC CATHETERIZATION  07-15-2003 DR HELEN PRESTON   MODERATE TO MODERATELY SEVERE CALCIFIC AORTIC STENOSIS/  NORMAL CORONARY ARTERIES  . CARDIOVASCULAR STRESS TEST  01-30-2012  DR NASHER   NORMAL NUCLEAR STUDY/  EF 73%/  NORMAL LVF  . CARPAL TUNNEL RELEASE Bilateral   . CATARACT EXTRACTION W/ INTRAOCULAR LENS  IMPLANT, BILATERAL    . CHOLECYSTECTOMY  1993  . CYSTO WITH HYDRODISTENSION N/A 02/05/2013   Procedure: CYSTOSCOPY/HYDRODISTENSION;  Surgeon: Irine Seal, MD;  Location: [facility];  Service: Urology;  Laterality: N/A;  . CYSTO/ HYDRODISTENTION/ INSTILLATION CLORPACTIN  MULTIPLE  last one 2009  . DILATION AND CURETTAGE OF UTERUS    . ERCP N/A  08/10/2012   Procedure: ENDOSCOPIC RETROGRADE CHOLANGIOPANCREATOGRAPHY (ERCP);  Surgeon: Inda Castle, MD;  Location: [place];  Service: Gastroenterology;  Laterality: N/A;  . ESOPHAGOGASTRODUODENOSCOPY (EGD) WITH PROPOFOL N/A 08/18/2014   Procedure: ESOPHAGOGASTRODUODENOSCOPY (EGD) WITH PROPOFOL;  Surgeon: Inda Castle, MD;  Location: [facility];  Service: Endoscopy;  Laterality: N/A;  . LEFT HEART CATHETERIZATION WITH CORONARY ANGIOGRAM N/A 10/18/2013   Procedure: LEFT HEART CATHETERIZATION WITH CORONARY ANGIOGRAM;  Surgeon: Jettie Booze, MD;  Location: [facility] C[facility] LAB;  Service: Cardiovascular;  Laterality: N/A;  . LUMBAR [place] &  2007  . MACROPLASTIQUE URETHRAL IMPLANTATION  08-27-2009  . MINI-OPEN RIGHT ROTATOR CUFF REPAIR  07-12-2001  . RIGHT SHOULDER ARTHROSCOPY W/ DEBRIDEMENT ROTATOR CUFF AND LABRAL TEAR/ ACROMINOPLASTY/ DISTAL CLAVICLE EXCISION/ CA LIGAMENT RELEASE  10-08-1999  . TONSILLECTOMY AND ADENOIDECTOMY    . TRANSTHORACIC ECHOCARDIOGRAM  08-10-2011   MILD LVH/  EF 55-60%/  NORMAL AVR TISSUE/ MILD MR/  MODERATE DILATED LA/  MILD DILATED RA  . TRANSVAGINAL TAPE PROCEDURE  07-30-2002  . VEIN LIGATION  1969   RIGHT LOWER LEG     Current Outpatient Medications  Medication Sig Dispense Refill  . atorvastatin (LIPITOR) 20 MG tablet Take 20 mg by mouth daily.    . busPIRone (BUSPAR) 15 MG tablet Take 15 mg by mouth 2 (two) times daily.    . cycloSPORINE (RESTASIS) 0.05 % ophthalmic emulsion Place 1 drop into both eyes 2 (two) times daily.    . diazepam (VALIUM) 5 MG tablet Take 5 mg by mouth at bedtime as needed (sleep).     Marland Kitchen diltiazem (CARDIZEM) 120 MG tablet Take 120 mg by mouth 2 (two) times daily.    . furosemide (LASIX) 40 MG tablet TAKE ONE TABLET BY MOUTH DAILY (Patient  taking differently: TAKE ONE TABLET (40 MG) BY MOUTH DAILY AS NEEDED FOR WEIGHT GAIN OF 2 LBS IN 24 HOURS) 90 tablet 2  . HYDROcodone-acetaminophen (NORCO/VICODIN) 5-325 MG per tablet Take  1 tablet by mouth every 6 (six) hours as needed (pain).     . hyoscyamine (LEVSIN, ANASPAZ) 0.125 MG tablet Take 0.125 mg by mouth every 4 (four) hours as needed for cramping.     Marland Kitchen ipratropium (ATROVENT) 0.06 % nasal spray Place 2 sprays into both nostrils 2 (two) times daily.     Marland Kitchen lisinopril (PRINIVIL,ZESTRIL) 40 MG tablet Take 20 mg by mouth daily.     . Multiple Vitamins-Minerals (ICAPS AREDS 2) CAPS Take 1 capsule by mouth 2 (two) times daily.    . ondansetron (ZOFRAN) 4 MG tablet Take 1 tablet (4 mg total) by mouth every 8 (eight) hours as needed for nausea. 60 tablet 3  . oxybutynin (DITROPAN) 5 MG tablet Take 5-10 mg by mouth See admin instructions. Take 1 tablet (5 mg) by mouth every morning and 2 tablets (10 mg) at night    . pantoprazole (PROTONIX) 40 MG tablet Take 1 tablet (40 mg total) by mouth 2 (two) times daily. (Patient taking differently: Take 40 mg by mouth daily. ) 180 tablet 3  . Polyvinyl Alcohol-Povidone (REFRESH OP) Place 1 drop into both eyes 2 (two) times daily.    . potassium chloride (K-DUR) 10 MEQ tablet TAKE ONE TABLET BY MOUTH DAILY (Patient taking differently: TAKE ONE TABLET (10 MEQ) BY MOUTH WITH EVERY DOSE OF LASIX (FUROSEMIDE)) 90 tablet 2  . promethazine (PHENERGAN) 12.5 MG tablet Take 1 tablet (12.5 mg total) by mouth every 6 (six) hours as needed for nausea or vomiting. 30 tablet 0  . ranitidine (ZANTAC) 150 MG tablet Take 150 mg by mouth at bedtime.     Marland Kitchen warfarin (COUMADIN) 5 MG tablet Take as directed by coumadin clinic 30 tablet 3   No current facility-administered medications for this visit.     Allergies:   Amlodipine; Amoxicillin; Carafate [sucralfate]; Cephalexin; Codeine; Irbesartan; Morphine and related; Neurontin [gabapentin]; Nitrofurantoin monohyd macro; Prednisone; and Sulfa antibiotics    Social History:  The patient  reports that she quit smoking about 27 years ago. Her smoking use included cigarettes. She started smoking about 68 years  ago. She has a 20.00 pack-year smoking history. She has never used smokeless tobacco. She reports that she does not drink alcohol or use drugs.   Family History:  The patient's family history includes Alcohol abuse in her sister and sister; Bipolar disorder in her brother and son; Colon cancer in her maternal aunt; Coronary artery disease in her brother; Esophageal cancer in her maternal grandfather; Heart disease in her father; Stomach cancer in her maternal grandmother.    ROS: Noted in current history.  Otherwise review of systems is negative.  PHYSICAL EXAM: VS:  BP (!) 118/56   Pulse 62   Ht 5\' 1"  (1.549 m)   Wt 140 lb 6.4 oz (63.7 kg)   SpO2 95%   BMI 26.53 kg/m  , BMI Body mass index is 26.53 kg/m. GEN: Elderly female.  Somewhat frail.  Walks with the assistance of a walker. HEENT: normal  Neck: No JVD. Cardiac: IIrreg.   Soft systolic murmur  Respiratory: Clear to auscultation GI: Tender, good bowel sounds Brittany: 2-3+ pitting edema.  Her legs are quite red.  There is no warmth. Skin: warm and dry, no rash Neuro:  Strength and sensation are intact,  walks with the assistance of a walker  Psych: normal   EKG:  EKG is ordered today.   Recent Labs: 09/29/2016: ALT 23 10/01/2016: Magnesium 1.5 10/02/2016: BUN 10; Creatinine, Ser 0.74; Hemoglobin 10.2; Platelets 171; Potassium 4.0; Sodium 133    Lipid Panel    Component Value Date/Time   CHOL 159 10/12/2013 0525   TRIG 53 10/12/2013 0525   HDL 62 10/12/2013 0525   CHOLHDL 2.6 10/12/2013 0525   VLDL 11 10/12/2013 0525   LDLCALC 86 10/12/2013 0525      Wt Readings from Last 3 Encounters:  07/13/17 140 lb 6.4 oz (63.7 kg)  06/06/17 130 lb (59 kg)  04/03/17 134 lb (60.8 kg)      Other studies Reviewed: Additional studies/ records that were reviewed today include: records from medical doctor. Review of the above records demonstrates: several months of abdominal pain    ASSESSMENT AND PLAN:  1.  Leg swelling.   Brittany [name] has very swollen and erythematous legs.  It appears that she has worsening acute on chronic diastolic congestive heart failure.  She spends a lot of times in a seated position.  We discussed elevation of her legs and I have suggested that she get a lounge doctor leg rest.  Also not clear if the furosemide is actually working.  She takes it on an as-needed basis but does not think that it causes her to urinate much.  We will discontinue the furosemide and start her on torsemide 20 mg a day.  We will add potassium chloride 20 mEq a day.  I will see her back in 3-4 weeks for follow-up visit.  We will check a basic metabolic profile today and again when I see her again in 3-4 weeks.   1. Aortic valve replacement - Brittany [name] is doing well. We'll continue the current medications. She's on Coumadin for her atrial fibrillation. She will get an echocardiogram for follow-up of her AVR.  2. Dyslipidemia-continue current medications.  Labs are fairly stable.  3. Hypertension-pressure is well controlled.  Continue current medications..   4. 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   5. Atrial fibrillation-  Stable   6. COPD:  Has a chronic cough .    7. CAD:   had a stent placed in July, 2015.   We have stopped the Plavix due to nose bleeds.  She'll continue with Coumadin  8.  Acute on Chronic diastolic congestive heart failure.   see above   Current medicines are reviewed at length with the patient today.  The patient does not have concerns regarding medicines.  The following changes have been made:  no change  Disposition:   FU with me in 3-4 weeks    Signed, Mertie Moores, MD  07/13/2017 4:10 PM    [facility] [address], [place], [place]  [address] Phone: [phone]; Fax: [phone]

## 2017-07-13 NOTE — Patient Instructions (Signed)
Description   Continue taking same dosage 1 tablet daily except 1/2 tablet on Tuesdays, Thursdays, and Saturdays. Recheck in 6 weeks. Call if any changes [phone]

## 2017-07-13 NOTE — Patient Instructions (Addendum)
Medication Instructions:  Your physician has recommended you make the following change in your medication:   STOP Lasix (Furosemide) START Torsemide (Demadex) 20 mg once daily in the AM START Kdur (potassium chloride) 20 mEq once daily in the AM   Labwork: TODAY - basic metabolic panel   Testing/Procedures: Your physician has requested that you have an echocardiogram. Echocardiography is a painless test that uses sound waves to create images of your heart. It provides your doctor with information about the size and shape of your heart and how well your heart's chambers and valves are working. This procedure takes approximately one hour. There are no restrictions for this procedure.   Follow-Up: Your physician recommends that you keep your follow-up appointment on April 29 with Dr. Acie Fredrickson   If you need a refill on your cardiac medications before your next appointment, please call your pharmacy.   Thank you for choosing [facility]! Christen Bame, RN [phone]     For your  leg edema you  should do  the following 1. Leg elevation - I recommend the Lounge Dr. Leg rest.  See below for details  2. Salt restriction  -  Use potassium chloride instead of regular salt as a salt substitute. 3. Walk regularly 4. Compression hose - [facility] [employer] [facility] 5. Weight loss    Available on [email or web address] Or  Go to [email or web address]

## 2017-07-14 LAB — BASIC METABOLIC PANEL
BUN / CREAT RATIO: 13 (ref 12–28)
BUN: 14 mg/dL (ref 8–27)
CO2: 25 mmol/L (ref 20–29)
CREATININE: 1.11 mg/dL — AB (ref 0.57–1.00)
Calcium: 8.9 mg/dL (ref 8.7–10.3)
Chloride: 94 mmol/L — ABNORMAL LOW (ref 96–106)
GFR calc non Af Amer: 44 mL/min/{1.73_m2} — ABNORMAL LOW (ref >59)
GFR, EST AFRICAN AMERICAN: 51 mL/min/{1.73_m2} — AB (ref >59)
Glucose: 97 mg/dL (ref 65–99)
Potassium: 4.8 mmol/L (ref 3.5–5.2)
Sodium: 133 mmol/L — ABNORMAL LOW (ref 134–144)

## 2017-07-18 DIAGNOSIS — M48061 Spinal stenosis, lumbar region without neurogenic claudication: Secondary | ICD-10-CM | POA: Diagnosis not present

## 2017-07-18 DIAGNOSIS — M5416 Radiculopathy, lumbar region: Secondary | ICD-10-CM | POA: Diagnosis not present

## 2017-07-21 ENCOUNTER — Other Ambulatory Visit: Payer: Self-pay

## 2017-07-21 ENCOUNTER — Ambulatory Visit (HOSPITAL_COMMUNITY): Payer: Medicare Other | Attending: Cardiology

## 2017-07-21 DIAGNOSIS — I5033 Acute on chronic diastolic (congestive) heart failure: Secondary | ICD-10-CM | POA: Diagnosis not present

## 2017-07-21 DIAGNOSIS — I371 Nonrheumatic pulmonary valve insufficiency: Secondary | ICD-10-CM | POA: Insufficient documentation

## 2017-07-21 DIAGNOSIS — M7989 Other specified soft tissue disorders: Secondary | ICD-10-CM

## 2017-07-21 DIAGNOSIS — E785 Hyperlipidemia, unspecified: Secondary | ICD-10-CM | POA: Insufficient documentation

## 2017-07-21 DIAGNOSIS — I071 Rheumatic tricuspid insufficiency: Secondary | ICD-10-CM | POA: Insufficient documentation

## 2017-07-21 DIAGNOSIS — Z952 Presence of prosthetic heart valve: Secondary | ICD-10-CM | POA: Insufficient documentation

## 2017-07-21 DIAGNOSIS — I110 Hypertensive heart disease with heart failure: Secondary | ICD-10-CM | POA: Diagnosis not present

## 2017-07-21 DIAGNOSIS — I4891 Unspecified atrial fibrillation: Secondary | ICD-10-CM | POA: Diagnosis not present

## 2017-07-31 ENCOUNTER — Encounter: Payer: Self-pay | Admitting: Gastroenterology

## 2017-08-07 ENCOUNTER — Ambulatory Visit (INDEPENDENT_AMBULATORY_CARE_PROVIDER_SITE_OTHER): Payer: Medicare Other | Admitting: Cardiovascular Disease

## 2017-08-07 ENCOUNTER — Encounter: Payer: Self-pay | Admitting: Cardiovascular Disease

## 2017-08-07 VITALS — BP 120/60 | HR 65 | Ht 61.0 in | Wt 128.8 lb

## 2017-08-07 DIAGNOSIS — I214 Non-ST elevation (NSTEMI) myocardial infarction: Secondary | ICD-10-CM

## 2017-08-07 DIAGNOSIS — I482 Chronic atrial fibrillation, unspecified: Secondary | ICD-10-CM

## 2017-08-07 DIAGNOSIS — I5032 Chronic diastolic (congestive) heart failure: Secondary | ICD-10-CM | POA: Diagnosis not present

## 2017-08-07 NOTE — Progress Notes (Signed)
Cardiology Office Note   ID:  Brittany [name], Brittany [name] 1928/02/03, MRN [id number]  PCP:  Mayra Neer, MD  Cardiologist:   Mertie Moores, MD   Chief Complaint  Patient presents with  . Congestive Heart Failure   1. Aortic valve replacement 2. Dyslipidemia 3. Hypertension 4. 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  5. Atrial fibrillation   Brittany [name] is an 84 yo with aortic valve replacement, HTN, and hyperlipidemia. She has not had any cardiac complaints recently. She complains of easy bruising on her arms.   She had an episode of severe heart burn this past week.   She's had a stroke over the past year. This has left her with some balance issues and some loss of peripheral vision.  Nov. 4, 2014:  Brittany [name] is doing well from a cardiac standpoint. She is having problems with interstitial cystitis. She had a procedure last week and had a small bladder tear which they are letting heal. She is still having some significant pain.   Aug 27, 2013:  Brittany [name] is doing OK. She is not having any problems . Still very active.  She had an AVR   She is having problems with interstitial cyctitis.   November 06, 2013:  Brittany [name] was admitted to the hospital twice on July , 2015: DC summary :  84 year old Caucasian female with past medical history significant for hypertension, hyperlipidemia, history of CVA permanent atrial fibrillation on Xarelto for the last 2 years, and history of aortic valve replacement with bioprosthetic valve in 2005, here with chest pain. She was recently admitted to the hospital for management of malignant HTN , diastolic HF and headache. She was seen by Dr Haroldine Laws and [place] on 10/16/2013. She returns within 24 hrs for symptoms of chest pain . Pt states that earlier in the afternoon while she was trying to sleep post lunch she started experiencing soreness , heaviness "someone sitting on my chest" on the left side radiating to left arm with  associated mild SOB. This improved with SL NTG , however, she continues to have some chest pain. Pt denied any orthopnea, PND , LE edema , syncope, claudication , palpitation etc . She reports medication compliance. She bruises easily in her skin and once episodes of epistaxis several months ago.  She was admitted. Xarelto was held in anticipation of coronary angiography. ASA, statin, BB, IV NTG and heparin added. She ruled in for NSTEMI with troponin of 0.63. Left heart cath revealed severe, fibrotic disease in the ostial right coronary artery which was the culprit lesion. This was successfully stented with a 2.5 x 14 resolute drug-eluting stent, postdilated to 3.3 mm in diameter. She was started on plavix, however, a P2Y12 came back at 289 so she was started on Brilinta. Cardizem cd 120 was added for afib RVR with improvement. Due to mild cough Lisinopril was changed to irbesartan. Xarelto was changed to coumadin and she will follow up two days after DC in the coumadin clinic   She now presents for follow up . She has some MSK pain ( hurts when she presses her chest. She has had some bleeding. She woke up in the morning with a small skin tear on her right ankle . There was lots of blood in the bed. The bleeding eventually stopped with pressure. She also has had a nose bleed.   She has not had any chest pain and her energy levels have been better. INR levels  have been OK.   Oct. 29, 2015:  Brittany.  [name] feeling better. She had a bad viral illness last week and thinks that she might have the flu. She is getting better.  She's had some problems with anemia and she is turned in 3 sets of guaiac cards. She has had some dyspnea and was started on Lasix on a QOD schedule.  She is now off Brilinta. She had a major bleeding from he right foot. She was on brilinta because She had an inadequate response to plavix originally but later we discovered that she was also on Omeprazole. We have  stopped the omeprazone and her PRU is better. She is taking Nexium instead.   Off Eliquis. Is back on warfarin. doing well     June 20, 2014:  Brittany [name] is a 84 y.o. female who presents for follow up .  She has had prolonged nausea and abdominal pain.  Worse with eating. No cardiac symptoms .   Sept. 8, 2016:    Doing well from a cardiac issue.  has been diagnosed with COPD ( was a former smoker, 25 years ago )  Still has some stomach issues.   Is still weak but is gradually improving  Having intermittant nose bleeds - is on Plavix and couamdin  June 18, 2015:  Doing ok from a cardiac standpoint Walks with the walker - has some leg edema .  Can use a cane when she is in her house Avoids eating salt-does not eat out much .     June 26, [address]:  Brittany [name] has been complaining of increasing shortness of breath / cough over the past several weeks.    We have backed off her diuretic because of the concern for renal insufficiency.   Her previous lasix dose was 20 mg on Mondays and Thursday.     Last week she called Sharyn Lull and we increased her Lasix.  40 mg a day for 2-3 days. She seems to be feeling quite a bit better.  She's lost 7 pounds. She is back close to normal .    Cough is better,   Still short of breath   Oct. 4, 2017:  Brittany [name] is seen back today  Still has shortness of breath, especially after she is rushing around .  Has had low salt level and her lasix was decreased and she has been limiting her free water intake.   Aug 09, 2016:  Brittany [name] is seen back for follow up for her CAD, HTN, atrial fib  and chronic diastolic CHF  No active cardiac issues  July 13, 2017:  Seen today as a work in visit . Has very swollen and erythematous legs .  She has had swollen legs for the past several weeks.  We have doubled her Lasix.  She did not notice any increase in urination with doubling of the Lasix. The legs have continued to become more more red and more  painful.  She was wearing High support hose.  She has a deep ring around the calf of her leg representing edema.  She takes Lasix on a PRN basis - based on weight  Does not put out much urine even when she takes the lasix   No CP or dyspnea.    She has gained 10 lbs of fluid over the past 3-4 weeks   August 07, 2017: Brittany [name] is seen back today for follow-up visit.  When I saw her last visit she  was having lots of leg swelling and the Lasix was not.  We changed to torsemide.  Wt Readings from Last 3 Encounters:  08/07/17 128 lb 12.8 oz (58.4 kg)  07/13/17 140 lb 6.4 oz (63.7 kg)  06/06/17 130 lb (59 kg)   She is tolerating the torsemide on a daily basis.  Her leg edema has significantly improved and she is getting around a lot better.   Past Medical History:  Diagnosis Date  . Arthritis   . Atrial fibrillation ([place])   . Chronic back pain   . Diverticulosis   . Dyslipidemia    takes Lipitor daily  . Dysrhythmia    Atrial Fibrillation  . Foot drop    SINCE 1987  . GERD (gastroesophageal reflux disease)   . H/O hiatal hernia   . Hearing impaired    Bilateral hearing aids  . History of bacterial endocarditis   . History of gastritis   . History of stomach ulcers    many yrs ago  . History of TIA (transient ischemic attack)    2013-- RESIDUAL PERIPHERAL VISION RIGHT EYE--  RESOLVED  . Hypertension   . IC (interstitial cystitis)   . Pulmonary hypertension ([place]) 10/2013   Based on TTE  . S/P aortic valve replacement    2005  . Urine incontinence     Past Surgical History:  Procedure Laterality Date  . ABDOMINAL HYSTERECTOMY  1967  . AORTIC VALVE REPLACEMENT  09-29-2003    [facility] pericardial tissue valve  . APPENDECTOMY  1933  . CARDIAC CATHETERIZATION  07-15-2003 DR HELEN PRESTON   MODERATE TO MODERATELY SEVERE CALCIFIC AORTIC STENOSIS/  NORMAL CORONARY ARTERIES  . CARDIOVASCULAR STRESS TEST  01-30-2012  DR NASHER   NORMAL NUCLEAR STUDY/  EF 73%/   NORMAL LVF  . CARPAL TUNNEL RELEASE Bilateral   . CATARACT EXTRACTION W/ INTRAOCULAR LENS  IMPLANT, BILATERAL    . CHOLECYSTECTOMY  1993  . CYSTO WITH HYDRODISTENSION N/A 02/05/2013   Procedure: CYSTOSCOPY/HYDRODISTENSION;  Surgeon: Irine Seal, MD;  [facility];  Service: Urology;  Laterality: N/A;  . CYSTO/ HYDRODISTENTION/ INSTILLATION CLORPACTIN  MULTIPLE  last one 2009  . DILATION AND CURETTAGE OF UTERUS    . ERCP N/A 08/10/2012   Procedure: ENDOSCOPIC RETROGRADE CHOLANGIOPANCREATOGRAPHY (ERCP);  Surgeon: Inda Castle, MD;  Location: [facility];  Service: Gastroenterology;  Laterality: N/A;  . ESOPHAGOGASTRODUODENOSCOPY (EGD) WITH PROPOFOL N/A 08/18/2014   Procedure: ESOPHAGOGASTRODUODENOSCOPY (EGD) WITH PROPOFOL;  Surgeon: Inda Castle, MD;  Location: [facility];  Service: Endoscopy;  Laterality: N/A;  . LEFT HEART CATHETERIZATION WITH CORONARY ANGIOGRAM N/A 10/18/2013   Procedure: LEFT HEART CATHETERIZATION WITH CORONARY ANGIOGRAM;  Surgeon: Jettie Booze, MD;  Location: [facility] CATH LAB;  Service: Cardiovascular;  Laterality: N/A;  . LUMBAR [place] &  2007  . MACROPLASTIQUE URETHRAL IMPLANTATION  08-27-2009  . MINI-OPEN RIGHT ROTATOR CUFF REPAIR  07-12-2001  . RIGHT SHOULDER ARTHROSCOPY W/ DEBRIDEMENT ROTATOR CUFF AND LABRAL TEAR/ ACROMINOPLASTY/ DISTAL CLAVICLE EXCISION/ CA LIGAMENT RELEASE  10-08-1999  . TONSILLECTOMY AND ADENOIDECTOMY    . TRANSTHORACIC ECHOCARDIOGRAM  08-10-2011   MILD LVH/  EF 55-60%/  NORMAL AVR TISSUE/ MILD MR/  MODERATE DILATED LA/  MILD DILATED RA  . TRANSVAGINAL TAPE PROCEDURE  07-30-2002  . VEIN LIGATION  1969   RIGHT LOWER LEG     Current Outpatient Medications  Medication Sig Dispense Refill  . atorvastatin (LIPITOR) 20 MG tablet Take 20 mg by mouth  daily.    . busPIRone (BUSPAR) 15 MG tablet Take 15 mg by mouth 2 (two) times daily.    . cycloSPORINE (RESTASIS) 0.05 % ophthalmic emulsion Place 1 drop into both eyes  2 (two) times daily.    . diazepam (VALIUM) 5 MG tablet Take 5 mg by mouth at bedtime as needed (sleep).     Marland Kitchen diltiazem (CARDIZEM) 120 MG tablet Take 120 mg by mouth 2 (two) times daily.    . furosemide (LASIX) 40 MG tablet Take 40 mg by mouth.    Marland Kitchen HYDROcodone-acetaminophen (NORCO/VICODIN) 5-325 MG per tablet Take 1 tablet by mouth every 6 (six) hours as needed (pain).     . hyoscyamine (LEVSIN, ANASPAZ) 0.125 MG tablet Take 0.125 mg by mouth every 4 (four) hours as needed for cramping.     Marland Kitchen ipratropium (ATROVENT) 0.06 % nasal spray Place 2 sprays into both nostrils 2 (two) times daily.     Marland Kitchen lisinopril (PRINIVIL,ZESTRIL) 40 MG tablet Take 20 mg by mouth daily.     . Multiple Vitamins-Minerals (ICAPS AREDS 2) CAPS Take 1 capsule by mouth 2 (two) times daily.    . ondansetron (ZOFRAN) 4 MG tablet Take 1 tablet (4 mg total) by mouth every 8 (eight) hours as needed for nausea. 60 tablet 3  . oxybutynin (DITROPAN) 5 MG tablet Take 5-10 mg by mouth See admin instructions. Take 1 tablet (5 mg) by mouth every morning and 2 tablets (10 mg) at night    . pantoprazole (PROTONIX) 40 MG tablet Take 1 tablet (40 mg total) by mouth 2 (two) times daily. (Patient taking differently: Take 40 mg by mouth daily. ) 180 tablet 3  . Polyvinyl Alcohol-Povidone (REFRESH OP) Place 1 drop into both eyes 2 (two) times daily.    . potassium chloride SA (K-DUR,KLOR-CON) 20 MEQ tablet Take 1 tablet (20 mEq total) by mouth daily. 90 tablet 3  . promethazine (PHENERGAN) 12.5 MG tablet Take 1 tablet (12.5 mg total) by mouth every 6 (six) hours as needed for nausea or vomiting. 30 tablet 0  . ranitidine (ZANTAC) 150 MG tablet Take 150 mg by mouth at bedtime.     Marland Kitchen warfarin (COUMADIN) 5 MG tablet Take as directed by coumadin clinic 30 tablet 3   No current facility-administered medications for this visit.     Allergies:   Amlodipine; Amoxicillin; Carafate [sucralfate]; Cephalexin; Codeine; Irbesartan; Morphine and related;  Neurontin [gabapentin]; Nitrofurantoin monohyd macro; Prednisone; and Sulfa antibiotics    Social History:  The patient  reports that she quit smoking about 27 years ago. Her smoking use included cigarettes. She started smoking about 68 years ago. She has a 20.00 pack-year smoking history. She has never used smokeless tobacco. She reports that she does not drink alcohol or use drugs.   Family History:  The patient's family history includes Alcohol abuse in her sister and sister; Bipolar disorder in her brother and son; Colon cancer in her maternal aunt; Coronary artery disease in her brother; Esophageal cancer in her maternal grandfather; Heart disease in her father; Stomach cancer in her maternal grandmother.    ROS: Noted in current history, all other review of systems are negative.Marland Kitchen  Physical Exam: Blood pressure 120/60, pulse 65, height 5\' 1"  (1.549 m), weight 128 lb 12.8 oz (58.4 kg), SpO2 96 %.  GEN:  Well nourished, well developed in no acute distress HEENT: Normal NECK: No JVD; No carotid bruits LYMPHATICS: No lymphadenopathy CARDIAC: Irreg.  Irreg.  Soft systolic murmur,  RESPIRATORY:  Clear to auscultation without rales, wheezing or rhonchi  ABDOMEN: Soft, non-tender, non-distended MUSCULOSKELETAL:  No edema; No deformity  SKIN: Warm and dry NEUROLOGIC:  Alert and oriented x 3  EKG:      Recent Labs: 09/29/2016: ALT 23 10/01/2016: Magnesium 1.5 10/02/2016: Hemoglobin 10.2; Platelets 171 07/13/2017: BUN 14; Creatinine, Ser 1.11; Potassium 4.8; Sodium 133    Lipid Panel    Component Value Date/Time   CHOL 159 10/12/2013 0525   TRIG 53 10/12/2013 0525   HDL 62 10/12/2013 0525   CHOLHDL 2.6 10/12/2013 0525   VLDL 11 10/12/2013 0525   LDLCALC 86 10/12/2013 0525      Wt Readings from Last 3 Encounters:  08/07/17 128 lb 12.8 oz (58.4 kg)  07/13/17 140 lb 6.4 oz (63.7 kg)  06/06/17 130 lb (59 kg)      Other studies Reviewed: Additional studies/ records that were  reviewed today include: records from medical doctor. Review of the above records demonstrates: several months of abdominal pain    ASSESSMENT AND PLAN:  1.  Leg swelling.   Markedly improved on Torsemide. Check BMP today  Continue current dose of torsemide.  1. Aortic valve replacement - Brittany [name] is doing well. We'll continue the current medications. She's on Coumadin for her atrial fibrillation. Valve sounds normal.  2. Dyslipidemia-continue current medications.  Labs are fairly stable.  3. Hypertension-pressure is well controlled.  Continue current medications..   4. 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   5. Atrial fibrillation-   Stable, continue coumadin   6. COPD:  Has a chronic cough .    7. CAD:   had a stent placed in July, 2015.   We have stopped the Plavix due to nose bleeds.  She'll continue with Coumadin  8.  Acute on Chronic diastolic congestive heart failure.   see above   Current medicines are reviewed at length with the patient today.  The patient does not have concerns regarding medicines.  The following changes have been made:  no change  Disposition:   FU with me in 6 months    Signed, Mertie Moores, MD  08/07/2017 3:01 PM    [facility] [address], [place], [place]  [address] Phone: [phone]; Fax: [phone]

## 2017-08-07 NOTE — Patient Instructions (Addendum)
Medication Instructions:  Your physician recommends that you continue on your current medications as directed. Please refer to the Current Medication list given to you today.   Labwork: TODAY - basic metabolic panel   Testing/Procedures: None Ordered   Follow-Up: Your physician recommends that you return for a follow-up appointment on Monday July 29 at 2:20 pm  If you need a refill on your cardiac medications before your next appointment, please call your pharmacy.   Thank you for choosing [facility]! Christen Bame, RN [phone]

## 2017-08-08 ENCOUNTER — Encounter: Payer: Self-pay | Admitting: Cardiovascular Disease

## 2017-08-08 LAB — BASIC METABOLIC PANEL
BUN / CREAT RATIO: 22 (ref 12–28)
BUN: 26 mg/dL (ref 8–27)
CHLORIDE: 91 mmol/L — AB (ref 96–106)
CO2: 26 mmol/L (ref 20–29)
Calcium: 9.4 mg/dL (ref 8.7–10.3)
Creatinine, Ser: 1.19 mg/dL — ABNORMAL HIGH (ref 0.57–1.00)
GFR calc Af Amer: 47 mL/min/{1.73_m2} — ABNORMAL LOW (ref >59)
GFR calc non Af Amer: 41 mL/min/{1.73_m2} — ABNORMAL LOW (ref >59)
Glucose: 88 mg/dL (ref 65–99)
Potassium: 4.9 mmol/L (ref 3.5–5.2)
Sodium: 133 mmol/L — ABNORMAL LOW (ref 134–144)

## 2017-08-21 DIAGNOSIS — M19011 Primary osteoarthritis, right shoulder: Secondary | ICD-10-CM | POA: Diagnosis not present

## 2017-08-23 DIAGNOSIS — I69993 Ataxia following unspecified cerebrovascular disease: Secondary | ICD-10-CM | POA: Diagnosis not present

## 2017-08-23 DIAGNOSIS — K219 Gastro-esophageal reflux disease without esophagitis: Secondary | ICD-10-CM | POA: Diagnosis not present

## 2017-08-23 DIAGNOSIS — N182 Chronic kidney disease, stage 2 (mild): Secondary | ICD-10-CM | POA: Diagnosis not present

## 2017-08-23 DIAGNOSIS — I482 Chronic atrial fibrillation: Secondary | ICD-10-CM | POA: Diagnosis not present

## 2017-08-23 DIAGNOSIS — E782 Mixed hyperlipidemia: Secondary | ICD-10-CM | POA: Diagnosis not present

## 2017-08-23 DIAGNOSIS — R410 Disorientation, unspecified: Secondary | ICD-10-CM | POA: Diagnosis not present

## 2017-08-23 DIAGNOSIS — J449 Chronic obstructive pulmonary disease, unspecified: Secondary | ICD-10-CM | POA: Diagnosis not present

## 2017-08-23 DIAGNOSIS — I2510 Atherosclerotic heart disease of native coronary artery without angina pectoris: Secondary | ICD-10-CM | POA: Diagnosis not present

## 2017-08-23 DIAGNOSIS — I5030 Unspecified diastolic (congestive) heart failure: Secondary | ICD-10-CM | POA: Diagnosis not present

## 2017-08-23 DIAGNOSIS — F411 Generalized anxiety disorder: Secondary | ICD-10-CM | POA: Diagnosis not present

## 2017-08-23 DIAGNOSIS — I130 Hypertensive heart and chronic kidney disease with heart failure and stage 1 through stage 4 chronic kidney disease, or unspecified chronic kidney disease: Secondary | ICD-10-CM | POA: Diagnosis not present

## 2017-08-23 DIAGNOSIS — Z0000 Encounter for general adult medical examination without abnormal findings: Secondary | ICD-10-CM | POA: Diagnosis not present

## 2017-08-24 ENCOUNTER — Ambulatory Visit (INDEPENDENT_AMBULATORY_CARE_PROVIDER_SITE_OTHER): Payer: Medicare Other | Admitting: *Deleted

## 2017-08-24 ENCOUNTER — Encounter: Payer: Self-pay | Admitting: Neurology

## 2017-08-24 DIAGNOSIS — Z5181 Encounter for therapeutic drug level monitoring: Secondary | ICD-10-CM | POA: Diagnosis not present

## 2017-08-24 DIAGNOSIS — I214 Non-ST elevation (NSTEMI) myocardial infarction: Secondary | ICD-10-CM | POA: Diagnosis not present

## 2017-08-24 LAB — POCT INR: INR: 2.4

## 2017-08-24 NOTE — Patient Instructions (Signed)
Description   Continue taking same dosage 1 tablet daily except 1/2 tablet on Tuesdays, Thursdays, and Saturdays. Recheck in 6 weeks. Call if any changes [phone]

## 2017-08-25 ENCOUNTER — Ambulatory Visit
Admission: RE | Admit: 2017-08-25 | Discharge: 2017-08-25 | Disposition: A | Discharge status: Home / Self Care | Payer: Medicare Other | Source: Ambulatory Visit | Attending: Family Medicine | Admitting: Family Medicine

## 2017-08-25 ENCOUNTER — Other Ambulatory Visit: Payer: Self-pay | Admitting: Family Medicine

## 2017-08-25 DIAGNOSIS — D72828 Other elevated white blood cell count: Secondary | ICD-10-CM

## 2017-08-25 DIAGNOSIS — J42 Unspecified chronic bronchitis: Secondary | ICD-10-CM

## 2017-08-25 DIAGNOSIS — D72829 Elevated white blood cell count, unspecified: Secondary | ICD-10-CM | POA: Diagnosis not present

## 2017-08-28 DIAGNOSIS — H04123 Dry eye syndrome of bilateral lacrimal glands: Secondary | ICD-10-CM | POA: Diagnosis not present

## 2017-08-28 DIAGNOSIS — H16223 Keratoconjunctivitis sicca, not specified as Sjogren's, bilateral: Secondary | ICD-10-CM | POA: Diagnosis not present

## 2017-08-28 DIAGNOSIS — H40013 Open angle with borderline findings, low risk, bilateral: Secondary | ICD-10-CM | POA: Diagnosis not present

## 2017-09-01 DIAGNOSIS — I482 Chronic atrial fibrillation: Secondary | ICD-10-CM | POA: Diagnosis not present

## 2017-09-04 ENCOUNTER — Other Ambulatory Visit: Payer: Self-pay | Admitting: Cardiovascular Disease

## 2017-09-17 ENCOUNTER — Other Ambulatory Visit: Payer: Self-pay | Admitting: Gastroenterology

## 2017-09-17 ENCOUNTER — Other Ambulatory Visit: Payer: Self-pay | Admitting: Cardiovascular Disease

## 2017-09-25 ENCOUNTER — Encounter (INDEPENDENT_AMBULATORY_CARE_PROVIDER_SITE_OTHER): Payer: Medicare Other | Admitting: Ophthalmology

## 2017-09-25 ENCOUNTER — Ambulatory Visit (INDEPENDENT_AMBULATORY_CARE_PROVIDER_SITE_OTHER): Payer: Medicare Other | Admitting: Ophthalmology

## 2017-09-25 DIAGNOSIS — I10 Essential (primary) hypertension: Secondary | ICD-10-CM | POA: Diagnosis not present

## 2017-09-25 DIAGNOSIS — H35033 Hypertensive retinopathy, bilateral: Secondary | ICD-10-CM

## 2017-09-25 DIAGNOSIS — H43813 Vitreous degeneration, bilateral: Secondary | ICD-10-CM

## 2017-09-25 DIAGNOSIS — H353134 Nonexudative age-related macular degeneration, bilateral, advanced atrophic with subfoveal involvement: Secondary | ICD-10-CM | POA: Diagnosis not present

## 2017-09-25 DIAGNOSIS — D3132 Benign neoplasm of left choroid: Secondary | ICD-10-CM | POA: Diagnosis not present

## 2017-10-05 ENCOUNTER — Ambulatory Visit (INDEPENDENT_AMBULATORY_CARE_PROVIDER_SITE_OTHER): Payer: Medicare Other | Admitting: *Deleted

## 2017-10-05 DIAGNOSIS — Z5181 Encounter for therapeutic drug level monitoring: Secondary | ICD-10-CM

## 2017-10-05 DIAGNOSIS — I214 Non-ST elevation (NSTEMI) myocardial infarction: Secondary | ICD-10-CM

## 2017-10-05 LAB — POCT INR: INR: 1.9 — AB (ref 2.0–3.0)

## 2017-10-05 NOTE — Patient Instructions (Signed)
Description   Today take 1 tablet, then Continue taking same dosage 1 tablet daily except 1/2 tablet on Tuesdays, Thursdays, and Saturdays. Recheck in 4 weeks. Call if any changes [phone]

## 2017-10-10 ENCOUNTER — Ambulatory Visit: Payer: Medicare Other | Admitting: Gastroenterology

## 2017-10-17 ENCOUNTER — Encounter (INDEPENDENT_AMBULATORY_CARE_PROVIDER_SITE_OTHER): Payer: Self-pay

## 2017-10-17 ENCOUNTER — Ambulatory Visit (INDEPENDENT_AMBULATORY_CARE_PROVIDER_SITE_OTHER): Payer: Medicare Other | Admitting: Gastroenterology

## 2017-10-17 ENCOUNTER — Encounter: Payer: Self-pay | Admitting: Gastroenterology

## 2017-10-17 VITALS — BP 88/56 | HR 62 | Ht 61.0 in | Wt 131.6 lb

## 2017-10-17 DIAGNOSIS — K219 Gastro-esophageal reflux disease without esophagitis: Secondary | ICD-10-CM | POA: Diagnosis not present

## 2017-10-17 DIAGNOSIS — R932 Abnormal findings on diagnostic imaging of liver and biliary tract: Secondary | ICD-10-CM

## 2017-10-17 DIAGNOSIS — I214 Non-ST elevation (NSTEMI) myocardial infarction: Secondary | ICD-10-CM

## 2017-10-17 DIAGNOSIS — Z8719 Personal history of other diseases of the digestive system: Secondary | ICD-10-CM | POA: Diagnosis not present

## 2017-10-17 DIAGNOSIS — R1013 Epigastric pain: Secondary | ICD-10-CM | POA: Diagnosis not present

## 2017-10-17 NOTE — Progress Notes (Signed)
HPI :  84 y/o female here for reassessment, known to me for possible cirrhosis, chronic dyspepsia. She continues to take coumadin for history of Afibb and TIA.   She has continued to take buspirone for her dyspepsia. She states this has worked better than anything she has taken for this in the past and is quite pleased with the regimen. She is tolerating it well. She reports eating 3 meals a day. She does not have any vomiting. Weight is stable. She is also taking Protonix once daily and this is controlling her reflux symptoms well. She did not want to come off this medication the last spoke about it. Since her last visit she had an ultrasound done on December 2018. This showed possible early changes of cirrhosis without any focal abnormalities. She's had a prior lab work for causes for cirrhosis which has been unremarkable historically. She denies any history of significant alcohol. She had lab work done this past May. Her hemoglobin was normal, her platelet counts 216, her LFTs remain normal. If she has cirrhosis, she has not had any decompensations. Her INR is elevated due to Coumadin use. Her last upper endoscopy was in 2016 without any evidence of varices. She is not interested in any surveillance endoscopy. She denies any abdominal pains or problems with her bowels at this time. She continues to take about 2-3 tablets of Vicodin per day for chronic back pain, followed by pain management. She notes that her blood pressure has been fluctuating from 80 systolic to 409B, has been rather regular. She denies any symptoms with low blood pressure, denies lightheadedness, dizziness, no chest pain or shortness of breath. Overall she states she is feeling very well.   Last colonoscopy 2006 - diverticulosis, hemorrhoids, small 44mm hyperplastic polyp left side EGD 08/18/2014 - no varices   Past Medical History:  Diagnosis Date  . Abnormal liver diagnostic imaging    suspected cirrhosis  . Arthritis   .  Atrial fibrillation ([place])   . Chronic back pain   . Diverticulosis   . Dyslipidemia    takes Lipitor daily  . Dysrhythmia    Atrial Fibrillation  . Foot drop    SINCE 1987  . GERD (gastroesophageal reflux disease)   . H/O hiatal hernia   . Hearing impaired    Bilateral hearing aids  . History of bacterial endocarditis   . History of gastritis   . History of stomach ulcers    many yrs ago  . History of TIA (transient ischemic attack)    2013-- RESIDUAL PERIPHERAL VISION RIGHT EYE--  RESOLVED  . Hypertension   . IC (interstitial cystitis)   . Pulmonary hypertension ([place]) 10/2013   Based on TTE  . S/P aortic valve replacement    2005  . Urine incontinence      Past Surgical History:  Procedure Laterality Date  . ABDOMINAL HYSTERECTOMY  1967  . AORTIC VALVE REPLACEMENT  09-29-2003    [facility] pericardial tissue valve  . APPENDECTOMY  1933  . CARDIAC CATHETERIZATION  07-15-2003 DR HELEN PRESTON   MODERATE TO MODERATELY SEVERE CALCIFIC AORTIC STENOSIS/  NORMAL CORONARY ARTERIES  . CARDIOVASCULAR STRESS TEST  01-30-2012  DR NASHER   NORMAL NUCLEAR STUDY/  EF 73%/  NORMAL LVF  . CARPAL TUNNEL RELEASE Bilateral   . CATARACT EXTRACTION W/ INTRAOCULAR LENS  IMPLANT, BILATERAL    . CHOLECYSTECTOMY  1993  . CYSTO WITH HYDRODISTENSION N/A 02/05/2013   Procedure: CYSTOSCOPY/HYDRODISTENSION;  Surgeon: Irine Seal,  MD;  Location: [facility];  Service: Urology;  Laterality: N/A;  . CYSTO/ HYDRODISTENTION/ INSTILLATION CLORPACTIN  MULTIPLE  last one 2009  . DILATION AND CURETTAGE OF UTERUS    . ERCP N/A 08/10/2012   Procedure: ENDOSCOPIC RETROGRADE CHOLANGIOPANCREATOGRAPHY (ERCP);  Surgeon: Inda Castle, MD;  Location: [facility];  Service: Gastroenterology;  Laterality: N/A;  . ESOPHAGOGASTRODUODENOSCOPY (EGD) WITH PROPOFOL N/A 08/18/2014   Procedure: ESOPHAGOGASTRODUODENOSCOPY (EGD) WITH PROPOFOL;  Surgeon: Inda Castle, MD;  Location: [facility];  Service:  Endoscopy;  Laterality: N/A;  . LEFT HEART CATHETERIZATION WITH CORONARY ANGIOGRAM N/A 10/18/2013   Procedure: LEFT HEART CATHETERIZATION WITH CORONARY ANGIOGRAM;  Surgeon: Jettie Booze, MD;  Location: [facility] C[facility] LAB;  Service: Cardiovascular;  Laterality: N/A;  . LUMBAR [place] &  2007  . MACROPLASTIQUE URETHRAL IMPLANTATION  08-27-2009  . MINI-OPEN RIGHT ROTATOR CUFF REPAIR  07-12-2001  . RIGHT SHOULDER ARTHROSCOPY W/ DEBRIDEMENT ROTATOR CUFF AND LABRAL TEAR/ ACROMINOPLASTY/ DISTAL CLAVICLE EXCISION/ CA LIGAMENT RELEASE  10-08-1999  . TONSILLECTOMY AND ADENOIDECTOMY    . TRANSTHORACIC ECHOCARDIOGRAM  08-10-2011   MILD LVH/  EF 55-60%/  NORMAL AVR TISSUE/ MILD MR/  MODERATE DILATED LA/  MILD DILATED RA  . TRANSVAGINAL TAPE PROCEDURE  07-30-2002  . VEIN LIGATION  1969   RIGHT LOWER LEG   Family History  Problem Relation Age of Onset  . Heart disease Father   . Stomach cancer Maternal Grandmother   . Esophageal cancer Maternal Grandfather   . Bipolar disorder Son        Committed Suicide  . Coronary artery disease Brother   . Bipolar disorder Brother        commited suiside at 10yo  . Alcohol abuse Sister        sister #1  . Alcohol abuse Sister        sister #2  . Colon cancer Maternal Aunt   . Lung disease Neg Hx    Social History   Tobacco Use  . Smoking status: Former Smoker    Packs/day: 0.50    Years: 40.00    Pack years: 20.00    Types: Cigarettes    Start date: 05/06/1949    Last attempt to quit: 03/06/1990    Years since quitting: 27.6  . Smokeless tobacco: Never Used  Substance Use Topics  . Alcohol use: No    Alcohol/week: 0.0 oz    Comment: Remote social EtOH  . Drug use: No   Current Outpatient Medications  Medication Sig Dispense Refill  . atorvastatin (LIPITOR) 20 MG tablet Take 20 mg by mouth daily.    . busPIRone (BUSPAR) 15 MG tablet TAKE ONE TABLET BY MOUTH TWICE A DAY 180 tablet 0  . cycloSPORINE (RESTASIS) 0.05 % ophthalmic  emulsion Place 1 drop into both eyes 2 (two) times daily.    . diazepam (VALIUM) 5 MG tablet Take 5 mg by mouth at bedtime as needed (sleep).     Marland Kitchen diltiazem (CARDIZEM) 120 MG tablet Take 120 mg by mouth 2 (two) times daily.    . furosemide (LASIX) 40 MG tablet TAKE ONE TABLET BY MOUTH DAILY (Patient taking differently: take as needed) 90 tablet 2  . HYDROcodone-acetaminophen (NORCO/VICODIN) 5-325 MG per tablet Take 1 tablet by mouth every 6 (six) hours as needed (pain).     . hyoscyamine (LEVSIN, ANASPAZ) 0.125 MG tablet Take 0.125 mg by mouth every 4 (four) hours as needed for cramping.     Marland Kitchen  ipratropium (ATROVENT) 0.06 % nasal spray Place 2 sprays into both nostrils 2 (two) times daily.     Marland Kitchen lisinopril (PRINIVIL,ZESTRIL) 40 MG tablet Take 20 mg by mouth daily.     . Multiple Vitamins-Minerals (ICAPS AREDS 2) CAPS Take 1 capsule by mouth 2 (two) times daily.    . ondansetron (ZOFRAN) 4 MG tablet Take 1 tablet (4 mg total) by mouth every 8 (eight) hours as needed for nausea. 60 tablet 3  . oxybutynin (DITROPAN) 5 MG tablet Take 5-10 mg by mouth See admin instructions. Take 1 tablet (5 mg) by mouth every morning and 2 tablets (10 mg) at night    . pantoprazole (PROTONIX) 40 MG tablet TAKE ONE TABLET BY MOUTH TWICE A DAY (Patient taking differently: prn) 180 tablet 0  . Polyvinyl Alcohol-Povidone (REFRESH OP) Place 1 drop into both eyes 2 (two) times daily.    . potassium chloride SA (K-DUR,KLOR-CON) 20 MEQ tablet Take 1 tablet (20 mEq total) by mouth daily. (Patient taking differently: Take 20 mEq by mouth daily. When taking Lasix) 90 tablet 3  . promethazine (PHENERGAN) 12.5 MG tablet Take 1 tablet (12.5 mg total) by mouth every 6 (six) hours as needed for nausea or vomiting. 30 tablet 0  . ranitidine (ZANTAC) 150 MG tablet Take 150 mg by mouth at bedtime.     . torsemide (DEMADEX) 20 MG tablet Take 20 mg by mouth daily.     Marland Kitchen warfarin (COUMADIN) 5 MG tablet TAKE AS DIRECTED BY COUMADIN CLINIC 30  tablet 3   No current facility-administered medications for this visit.    Allergies  Allergen Reactions  . Amlodipine Swelling and Other (See Comments)     Unspecified swelling reaction  . Amoxicillin Diarrhea, Nausea And Vomiting and Other (See Comments)    Has patient had a PCN reaction causing immediate rash, facial/tongue/throat swelling, SOB or lightheadedness with hypotension: No Has patient had a PCN reaction causing severe rash involving mucus membranes or skin necrosis: No Has patient had a PCN reaction that required hospitalization: No Has patient had a PCN reaction occurring within the last 10 years: No If all of the above answers are "NO", then may proceed with Cephalosporin use.  . Carafate [Sucralfate] Swelling and Other (See Comments)    Reaction:  Knee swelling/redness   . Cephalexin Diarrhea  . Codeine Nausea And Vomiting  . Irbesartan Swelling and Other (See Comments)    Reaction:  Facial/hand swelling and numbness   . Morphine And Related Other (See Comments)    Pt states that she has a history of addiction with this medication.      . Neurontin [Gabapentin] Swelling and Other (See Comments)    Reaction:  Leg swelling   . Nitrofurantoin Monohyd Macro Diarrhea and Nausea And Vomiting  . Prednisone Other (See Comments)    Reaction:  Elevated BP  . Sulfa Antibiotics Rash     Review of Systems: All systems reviewed and negative except where noted in HPI.   Labs per HPI - attached in [place] from May 2019  Physical Exam: BP (!) 88/56   Pulse 62   Ht 5\' 1"  (1.549 m)   Wt 131 lb 9.6 oz (59.7 kg)   BMI 24.87 kg/m  Constitutional: Pleasant, female in no acute distress, uses walker for ambulation HEENT: Normocephalic and atraumatic. Conjunctivae are normal. No scleral icterus. Neck supple.  Cardiovascular: Normal rate, regular rhythm.  Pulmonary/chest: Effort normal and breath sounds normal. No wheezing, rales or rhonchi. Abdominal:  Soft, nondistended,  nontender. . There are no masses palpable. No hepatomegaly. Extremities: no edema Lymphadenopathy: No cervical adenopathy noted. Neurological: Alert and oriented to person place and time. Skin: Skin is warm and dry. No rashes noted. Psychiatric: Normal mood and affect. Behavior is normal.   ASSESSMENT AND PLAN: 84 year old female here for reassessment following issues:  Dyspepsia - significantly improved with use of buspirone. This seems to be managing quite well, we'll continue this. Long term we had discussed that in light of this issue and her history of falls as well as underlying liver disease, that chronic narcotics is not a good management strategy as she gets older. She understands this, however it is working to control her back pain, she is followed closely by pain management for this issue.  Abnormal liver imaging - cirrhotic appearing liver on regular ultrasound,however elastography shows score ofmostlyF2,some ofF3.Spleen and platelets are normal.LFTs are normal. If she has cirrhosis,she is well-compensated and this has not bothered her. Prior lab workup unrevealing. We discussed long-term, how aggressive she wants to be with management of this in light of her age and other medical problems. She is not interested in any endoscopic evaluation to screen for varices. We discussed whether or not she wanted further [place] screening, given that if she did have a hepatic mass lesion noted she did not think she would want any sort of biopsy or invasive therapy. In this light she wanted to hold off on further [place] screening exams which is reasonable. If her labs become abnormal moving forward we will reconsider imaging. She will follow up with me in 6 months for reassessment.  GERD - she prefers to continue current dose of Protonix is controlling her symptoms well compared to lower dosing. Long-term recommend low-dose dose needed to control symptoms, she has no history of Barrett's.  History  of colitis - prior CT scan showing colitis last June, complained only of pain without diarrhea or bloody stools per her report, this would make infectious seem less likely. CT angio showed no vascular compromise. In general I discussed how we normally follow-up CT scan finding such as this with a colonoscopy, however given her age and comorbidities she strongly declined colonoscopy and never wishes to have one again. If she has recurrence of her previous pain she should contact us.   HTN - on multidrug regimen, BP is on lower side today but she's asymptomatic. She states this has been fluctuating up and down and following with her cardiologist in the near future, defer to them for adjustment of her regimen.   F/u 6 months with labs at that time. She agreed.  [place] Cellar, MD [facility]

## 2017-10-17 NOTE — Patient Instructions (Addendum)
If you are age 82 or older, your body mass index should be between 23-30. Your Body mass index is 24.87 kg/m. If this is out of the aforementioned range listed, please consider follow up with your Primary Care Provider.  If you are age 34 or younger, your body mass index should be between 19-25. Your Body mass index is 24.87 kg/m. If this is out of the aformentioned range listed, please consider follow up with your Primary Care Provider.   Please follow up in 6 months.  Thank you for entrusting me with your care and for choosing [facility], Dr. Caroline Cellar

## 2017-10-18 DIAGNOSIS — M48061 Spinal stenosis, lumbar region without neurogenic claudication: Secondary | ICD-10-CM | POA: Diagnosis not present

## 2017-10-18 DIAGNOSIS — M5416 Radiculopathy, lumbar region: Secondary | ICD-10-CM | POA: Diagnosis not present

## 2017-10-27 ENCOUNTER — Other Ambulatory Visit: Payer: Self-pay | Admitting: Cardiovascular Disease

## 2017-11-02 DIAGNOSIS — L570 Actinic keratosis: Secondary | ICD-10-CM | POA: Diagnosis not present

## 2017-11-02 DIAGNOSIS — L503 Dermatographic urticaria: Secondary | ICD-10-CM | POA: Diagnosis not present

## 2017-11-03 ENCOUNTER — Other Ambulatory Visit: Payer: Self-pay | Admitting: Cardiovascular Disease

## 2017-11-06 ENCOUNTER — Ambulatory Visit (INDEPENDENT_AMBULATORY_CARE_PROVIDER_SITE_OTHER): Payer: Medicare Other | Admitting: Cardiovascular Disease

## 2017-11-06 ENCOUNTER — Ambulatory Visit (INDEPENDENT_AMBULATORY_CARE_PROVIDER_SITE_OTHER): Payer: Medicare Other | Admitting: *Deleted

## 2017-11-06 ENCOUNTER — Encounter: Payer: Self-pay | Admitting: Cardiovascular Disease

## 2017-11-06 VITALS — BP 112/46 | HR 57 | Ht 61.0 in | Wt 129.1 lb

## 2017-11-06 DIAGNOSIS — I482 Chronic atrial fibrillation, unspecified: Secondary | ICD-10-CM

## 2017-11-06 DIAGNOSIS — I214 Non-ST elevation (NSTEMI) myocardial infarction: Secondary | ICD-10-CM | POA: Diagnosis not present

## 2017-11-06 DIAGNOSIS — I350 Nonrheumatic aortic (valve) stenosis: Secondary | ICD-10-CM

## 2017-11-06 DIAGNOSIS — Z5181 Encounter for therapeutic drug level monitoring: Secondary | ICD-10-CM

## 2017-11-06 DIAGNOSIS — I5032 Chronic diastolic (congestive) heart failure: Secondary | ICD-10-CM | POA: Diagnosis not present

## 2017-11-06 LAB — POCT INR: INR: 1.7 — AB (ref 2.0–3.0)

## 2017-11-06 NOTE — Progress Notes (Signed)
Cardiology Office Note   ID:  Brittany [name], Brittany [name] March 21, 1928, MRN [id number]  PCP:  Brittany Neer, MD  Cardiologist:   Brittany Moores, MD   Chief Complaint  Patient presents with  . Atrial Fibrillation  . Congestive Heart Failure   1. Aortic valve replacement 2. Dyslipidemia 3. Hypertension [name]. 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  5. Atrial fibrillation   Brittany [name] is an 84 yo with aortic valve replacement, HTN, and hyperlipidemia. She has not had any cardiac complaints recently. She complains of easy bruising on her arms.   She had an episode of severe heart burn this past week.   She's had a stroke over the past year. This has left her with some balance issues and some loss of peripheral vision.  Nov. [name], 2014:  Brittany [name] is doing well from a cardiac standpoint. She is having problems with interstitial cystitis. She had a procedure last week and had a small bladder tear which they are letting heal. She is still having some significant pain.   Aug 27, 2013:  Brittany [name] is doing OK. She is not having any problems . Still very active.  She had an AVR   She is having problems with interstitial cyctitis.   November 06, 2013:  Brittany [name] was admitted to the hospital twice on July , 2015: DC summary :  84 year old Caucasian female with past medical history significant for hypertension, hyperlipidemia, history of CVA permanent atrial fibrillation on Xarelto for the last 2 years, and history of aortic valve replacement with bioprosthetic valve in 2005, here with chest pain. She was recently admitted to the hospital for management of malignant HTN , diastolic HF and headache. She was seen by Dr Brittany [name] and Brittany [name] on 10/16/2013. She returns within 24 hrs for symptoms of chest pain . Pt states that earlier in the afternoon while she was trying to sleep post lunch she started experiencing soreness , heaviness "someone sitting on my chest" on the left side  radiating to left arm with associated mild SOB. This improved with SL NTG , however, she continues to have some chest pain. Pt denied any orthopnea, PND , LE edema , syncope, claudication , palpitation etc . She reports medication compliance. She bruises easily in her skin and once episodes of epistaxis several months ago.  She was admitted. Xarelto was held in anticipation of coronary angiography. ASA, statin, BB, IV NTG and heparin added. She ruled in for NSTEMI with troponin of 0.63. Left heart cath revealed severe, fibrotic disease in the ostial right coronary artery which was the culprit lesion. This was successfully stented with a 2.5 x 14 resolute drug-eluting stent, postdilated to 3.3 mm in diameter. She was started on plavix, however, a P2Y12 came back at 289 so she was started on Brilinta. Cardizem cd 120 was added for afib RVR with improvement. Due to mild cough Lisinopril was changed to irbesartan. Xarelto was changed to coumadin and she will follow up two days after DC in the coumadin clinic   She now presents for follow up . She has some MSK pain ( hurts when she presses her chest. She has had some bleeding. She woke up in the morning with a small skin tear on her right ankle . There was lots of blood in the bed. The bleeding eventually stopped with pressure. She also has had a nose bleed.   She has not had any chest pain and her energy levels have  been better. INR levels have been OK.   Oct. 29, 2015:  Brittany [name] feeling better. She had a bad viral illness last week and thinks that she might have the flu. She is getting better.  She's had some problems with anemia and she is turned in 3 sets of guaiac cards. She has had some dyspnea and was started on Lasix on a QOD schedule.  She is now off Brilinta. She had a major bleeding from he right foot. She was on brilinta because She had an inadequate response to plavix originally but later we discovered that she was also  on Omeprazole. We have stopped the omeprazone and her PRU is better. She is taking Nexium instead.   Off Eliquis. Is back on warfarin. doing well     June 20, 2014:  Brittany [name] is a 84 y.o. female who presents for follow up .  She has had prolonged nausea and abdominal pain.  Worse with eating. No cardiac symptoms .   Sept. 8, 2016:    Doing well from a cardiac issue.  has been diagnosed with COPD ( was a former smoker, 25 years ago )  Still has some stomach issues.   Is still weak but is gradually improving  Having intermittant nose bleeds - is on Plavix and couamdin  June 18, 2015:  Doing ok from a cardiac standpoint Walks with the walker - has some leg edema .  Can use a cane when she is in her house Avoids eating salt-does not eat out much .     June 26, 29017:  Brittany [name] has been complaining of increasing shortness of breath / cough over the past several weeks.    We have backed off her diuretic because of the concern for renal insufficiency.   Her previous lasix dose was 20 mg on Mondays and Thursday.     Last week she called Brittany [name] and we increased her Lasix.  40 mg a day for 2-3 days. She seems to be feeling quite a bit better.  She's lost 7 pounds. She is back close to normal .    Cough is better,   Still short of breath   Oct. [name], 2017:  Brittany [name] is seen back today  Still has shortness of breath, especially after she is rushing around .  Has had low salt level and her lasix was decreased and she has been limiting her free water intake.   Aug 09, 2016:  Brittany [name] is seen back for follow up for her CAD, HTN, atrial fib  and chronic diastolic CHF  No active cardiac issues  Brittany [name], Brittany [name]:  Seen today as a work in visit . Has very swollen and erythematous legs .  She has had swollen legs for the past several weeks.  We have doubled her Lasix.  She did not notice any increase in urination with doubling of the Lasix. The legs have continued to become  more more red and more painful.  She was wearing High support hose.  She has a deep ring around the calf of her leg representing edema.  She takes Lasix on a PRN basis - based on weight  Does not put out much urine even when she takes the lasix   No CP or dyspnea.    She has gained 10 lbs of fluid over the past 3-[name] weeks   July 29, Brittany [name]:  Brittany [name] is seen today for follow-up visit of her acute on  chronic diastolic congestive heart failure and atrial fibrillation. She had lots of edema when I last saw her.  We changed the furosemide to torsemide and this seems to be working great.  No chest pain or shortness of breath.  Her energy level seem to be stable.  Has bilateral tingling in her fingertips.  May be related to waking and holding on to her walker .    Past Medical History:  Diagnosis Date  . Abnormal liver diagnostic imaging    suspected cirrhosis  . Arthritis   . Atrial fibrillation ([place])   . Chronic back pain   . Diverticulosis   . Dyslipidemia    takes Lipitor daily  . Dysrhythmia    Atrial Fibrillation  . Foot drop    SINCE 1987  . GERD (gastroesophageal reflux disease)   . H/O hiatal hernia   . Hearing impaired    Bilateral hearing aids  . History of bacterial endocarditis   . History of gastritis   . History of stomach ulcers    many yrs ago  . History of TIA (transient ischemic attack)    2013-- RESIDUAL PERIPHERAL VISION RIGHT EYE--  RESOLVED  . Hypertension   . IC (interstitial cystitis)   . Pulmonary hypertension ([place]) 10/2013   Based on TTE  . S/P aortic valve replacement    2005  . Urine incontinence     Past Surgical History:  Procedure Laterality Date  . ABDOMINAL HYSTERECTOMY  1967  . AORTIC VALVE REPLACEMENT  09-29-2003    [facility] pericardial tissue valve  . APPENDECTOMY  1933  . CARDIAC CATHETERIZATION  07-15-2003 DR HELEN PRESTON   MODERATE TO MODERATELY SEVERE CALCIFIC AORTIC STENOSIS/  NORMAL CORONARY ARTERIES  .  CARDIOVASCULAR STRESS TEST  01-30-2012  DR NASHER   NORMAL NUCLEAR STUDY/  EF 73%/  NORMAL LVF  . CARPAL TUNNEL RELEASE Bilateral   . CATARACT EXTRACTION W/ INTRAOCULAR LENS  IMPLANT, BILATERAL    . CHOLECYSTECTOMY  1993  . CYSTO WITH HYDRODISTENSION N/A 02/05/2013   Procedure: CYSTOSCOPY/HYDRODISTENSION;  Surgeon: Irine Seal, MD;  Location[facility];  Service: Urology;  Laterality: N/A;  . CYSTO/ HYDRODISTENTION/ INSTILLATION CLORPACTIN  MULTIPLE  last one 2009  . DILATION AND CURETTAGE OF UTERUS    . ERCP N/A 08/10/2012   Procedure: ENDOSCOPIC RETROGRADE CHOLANGIOPANCREATOGRAPHY (ERCP);  Surgeon: Inda Castle, MD;  Location: [facility];  Service: Gastroenterology;  Laterality: N/A;  . ESOPHAGOGASTRODUODENOSCOPY (EGD) WITH PROPOFOL N/A 08/18/2014   Procedure: ESOPHAGOGASTRODUODENOSCOPY (EGD) WITH PROPOFOL;  Surgeon: Inda Castle, MD;  Location: [facility] END[facility];  Service: Endoscopy;  Laterality: N/A;  . LEFT HEART CATHETERIZATION WITH CORONARY ANGIOGRAM N/A 10/18/2013   Procedure: LEFT HEART CATHETERIZATION WITH CORONARY ANGIOGRAM;  Surgeon: Jettie Booze, MD;  Location: [facility] CATH LAB;  Service: Cardiovascular;  Laterality: N/A;  . LUMBAR [place] &  2007  . MACROPLASTIQUE URETHRAL IMPLANTATION  08-27-2009  . MINI-OPEN RIGHT ROTATOR CUFF REPAIR  07-12-2001  . RIGHT SHOULDER ARTHROSCOPY W/ DEBRIDEMENT ROTATOR CUFF AND LABRAL TEAR/ ACROMINOPLASTY/ DISTAL CLAVICLE EXCISION/ CA LIGAMENT RELEASE  10-08-1999  . TONSILLECTOMY AND ADENOIDECTOMY    . TRANSTHORACIC ECHOCARDIOGRAM  08-10-2011   MILD LVH/  EF 55-60%/  NORMAL AVR TISSUE/ MILD MR/  MODERATE DILATED LA/  MILD DILATED RA  . TRANSVAGINAL TAPE PROCEDURE  07-30-2002  . VEIN LIGATION  1969   RIGHT LOWER LEG     Current Outpatient Medications  Medication Sig Dispense Refill  . atorvastatin (  LIPITOR) 20 MG tablet Take 20 mg by mouth daily.    . busPIRone (BUSPAR) 15 MG tablet TAKE ONE TABLET BY MOUTH TWICE A DAY 180  tablet 0  . cycloSPORINE (RESTASIS) 0.05 % ophthalmic emulsion Place 1 drop into both eyes 2 (two) times daily.    . diazepam (VALIUM) 5 MG tablet Take 5 mg by mouth at bedtime as needed (sleep).     Marland Kitchen diltiazem (CARDIZEM) 120 MG tablet Take 120 mg by mouth 2 (two) times daily.    Marland Kitchen HYDROcodone-acetaminophen (NORCO/VICODIN) 5-325 MG per tablet Take 1 tablet by mouth every 6 (six) hours as needed (pain).     . hyoscyamine (LEVSIN, ANASPAZ) 0.125 MG tablet Take 0.125 mg by mouth every [name] (four) hours as needed for cramping.     Marland Kitchen ipratropium (ATROVENT) 0.06 % nasal spray Place 2 sprays into both nostrils 2 (two) times daily.     Marland Kitchen lisinopril (PRINIVIL,ZESTRIL) 40 MG tablet Take 20 mg by mouth daily.     . Multiple Vitamins-Minerals (ICAPS AREDS 2) CAPS Take 1 capsule by mouth 2 (two) times daily.    . ondansetron (ZOFRAN) [name] MG tablet Take 1 tablet ([name] mg total) by mouth every 8 (eight) hours as needed for nausea. 60 tablet 3  . oxybutynin (DITROPAN) 5 MG tablet Take 5-10 mg by mouth See admin instructions. Take 1 tablet (5 mg) by mouth every morning and 2 tablets (10 mg) at night    . pantoprazole (PROTONIX) 40 MG tablet TAKE ONE TABLET BY MOUTH TWICE A DAY (Patient taking differently: prn) 180 tablet 0  . Polyvinyl Alcohol-Povidone (REFRESH OP) Place 1 drop into both eyes 2 (two) times daily.    . potassium chloride SA (K-DUR,KLOR-CON) 20 MEQ tablet Take 20 mEq by mouth daily as needed (ONLY WHEN TAKING TORSEMIDE).    Marland Kitchen promethazine (PHENERGAN) 12.5 MG tablet Take 1 tablet (12.5 mg total) by mouth every 6 (six) hours as needed for nausea or vomiting. 30 tablet 0  . ranitidine (ZANTAC) 150 MG tablet Take 150 mg by mouth at bedtime.     . torsemide (DEMADEX) 20 MG tablet Take 20 mg by mouth daily.     Marland Kitchen warfarin (COUMADIN) 5 MG tablet TAKE AS DIRECTED BY COUMADIN CLINIC 30 tablet 3   No current facility-administered medications for this visit.     Allergies:   Amlodipine; Amoxicillin; Carafate  [sucralfate]; Cephalexin; Codeine; Irbesartan; Morphine and related; Neurontin [gabapentin]; Nitrofurantoin monohyd macro; Prednisone; and Sulfa antibiotics    Social History:  The patient  reports that she quit smoking about 27 years ago. Her smoking use included cigarettes. She started smoking about 68 years ago. She has a 20.00 pack-year smoking history. She has never used smokeless tobacco. She reports that she does not drink alcohol or use drugs.   Family History:  The patient's family history includes Alcohol abuse in her sister and sister; Bipolar disorder in her brother and son; Colon cancer in her maternal aunt; Coronary artery disease in her brother; Esophageal cancer in her maternal grandfather; Heart disease in her father; Stomach cancer in her maternal grandmother.    ROS: Noted in current history.  Otherwise review of systems is negative.  Physical Exam: Blood pressure (!) 112/46, pulse (!) 57, height 5\' 1"  (1.549 m), weight 129 lb 1.9 oz (58.6 kg), SpO2 97 %.  GEN:  Well nourished, well developed in no acute distress HEENT: Normal NECK: No JVD; No carotid bruits LYMPHATICS: No lymphadenopathy CARDIAC:   Irreg.  Irreg.  ,  Soft systolic murmur  RESPIRATORY:  Clear to auscultation without rales, wheezing or rhonchi  ABDOMEN: Soft, non-tender, non-distended MUSCULOSKELETAL:  Trace - 1 + bilateral edema  SKIN: Warm and dry NEUROLOGIC:  Alert and oriented x 3   EKG:     Recent Labs: [name]/29/Brittany [name]: BUN 26; Creatinine, Ser 1.19; Potassium [name].9; Sodium 133    Lipid Panel    Component Value Date/Time   CHOL 159 10/12/2013 0525   TRIG 53 10/12/2013 0525   HDL 62 10/12/2013 0525   CHOLHDL 2.6 10/12/2013 0525   VLDL 11 10/12/2013 0525   LDLCALC 86 10/12/2013 0525      Wt Readings from Last 3 Encounters:  11/06/17 129 lb 1.9 oz (58.6 kg)  10/17/17 131 lb 9.6 oz (59.7 kg)  08/07/17 128 lb 12.8 oz (58.[name] kg)      Other studies Reviewed: Additional studies/ records that  were reviewed today include: records from medical doctor. Review of the above records demonstrates: several months of abdominal pain    ASSESSMENT AND PLAN:  1.  Leg swelling.      1. Aortic valve replacement -   2. Dyslipidemia-   3. Hypertension- .   [name]. CVA -     5. Atrial fibrillation-      6. COPD:       7. CAD:      8.  Acute on Chronic diastolic congestive heart failure.     Current medicines are reviewed at length with the patient today.  The patient does not have concerns regarding medicines.  The following changes have been made:  no change  Disposition:   FU with me in 3-[name] weeks    Signed, Brittany Moores, MD  7/29/Brittany [name] 2:57 PM    [facility] [place], [place], [place]  [address] Phone: [phone]; Fax: [phone]

## 2017-11-06 NOTE — Patient Instructions (Signed)
Description   Today take 1.5  Tablets,  then change your  dosage to 1 tablet daily except 1/2 tablet on Tuesdays and Saturdays. Recheck in 2 weeks. Call if any changes [phone]

## 2017-11-06 NOTE — Patient Instructions (Signed)

## 2017-11-09 ENCOUNTER — Ambulatory Visit: Payer: Medicare Other | Admitting: Cardiovascular Disease

## 2017-11-10 ENCOUNTER — Ambulatory Visit: Payer: Medicare Other | Admitting: Neurology

## 2017-11-14 DIAGNOSIS — M25572 Pain in left ankle and joints of left foot: Secondary | ICD-10-CM | POA: Diagnosis not present

## 2017-11-16 DIAGNOSIS — D1801 Hemangioma of skin and subcutaneous tissue: Secondary | ICD-10-CM | POA: Diagnosis not present

## 2017-11-16 DIAGNOSIS — D485 Neoplasm of uncertain behavior of skin: Secondary | ICD-10-CM | POA: Diagnosis not present

## 2017-11-20 ENCOUNTER — Ambulatory Visit (INDEPENDENT_AMBULATORY_CARE_PROVIDER_SITE_OTHER): Payer: Medicare Other

## 2017-11-20 DIAGNOSIS — Z5181 Encounter for therapeutic drug level monitoring: Secondary | ICD-10-CM

## 2017-11-20 DIAGNOSIS — I214 Non-ST elevation (NSTEMI) myocardial infarction: Secondary | ICD-10-CM | POA: Diagnosis not present

## 2017-11-20 LAB — POCT INR: INR: 2.1 (ref 2.0–3.0)

## 2017-11-20 NOTE — Patient Instructions (Signed)
Description   Continue on same dosage 1 tablet daily except 1/2 tablet on Tuesdays and Saturdays. Recheck in 3 weeks. Call if any changes [phone]

## 2017-11-22 DIAGNOSIS — M19011 Primary osteoarthritis, right shoulder: Secondary | ICD-10-CM | POA: Diagnosis not present

## 2017-11-28 ENCOUNTER — Encounter: Payer: Self-pay | Admitting: Neurology

## 2017-12-01 ENCOUNTER — Other Ambulatory Visit: Payer: Self-pay | Admitting: Gastroenterology

## 2017-12-07 DIAGNOSIS — L929 Granulomatous disorder of the skin and subcutaneous tissue, unspecified: Secondary | ICD-10-CM | POA: Diagnosis not present

## 2017-12-07 DIAGNOSIS — L980 Pyogenic granuloma: Secondary | ICD-10-CM | POA: Diagnosis not present

## 2017-12-12 ENCOUNTER — Ambulatory Visit (INDEPENDENT_AMBULATORY_CARE_PROVIDER_SITE_OTHER): Payer: Medicare Other

## 2017-12-12 DIAGNOSIS — Z5181 Encounter for therapeutic drug level monitoring: Secondary | ICD-10-CM

## 2017-12-12 DIAGNOSIS — I214 Non-ST elevation (NSTEMI) myocardial infarction: Secondary | ICD-10-CM

## 2017-12-12 LAB — POCT INR: INR: 1.4 — AB (ref 2.0–3.0)

## 2017-12-12 NOTE — Patient Instructions (Addendum)
  Description   Take 1 tablet today, then start taking 1 tablet daily except 1/2 tablet on Tuesdays. Recheck in 1 week. Call if any changes [phone]

## 2017-12-13 ENCOUNTER — Ambulatory Visit: Payer: Medicare Other | Admitting: Neurology

## 2017-12-14 ENCOUNTER — Ambulatory Visit: Payer: Medicare Other | Admitting: Neurology

## 2017-12-15 ENCOUNTER — Other Ambulatory Visit: Payer: Self-pay | Admitting: Gastroenterology

## 2017-12-21 ENCOUNTER — Ambulatory Visit (INDEPENDENT_AMBULATORY_CARE_PROVIDER_SITE_OTHER): Payer: Medicare Other

## 2017-12-21 DIAGNOSIS — Z5181 Encounter for therapeutic drug level monitoring: Secondary | ICD-10-CM

## 2017-12-21 DIAGNOSIS — I214 Non-ST elevation (NSTEMI) myocardial infarction: Secondary | ICD-10-CM | POA: Diagnosis not present

## 2017-12-21 LAB — POCT INR: INR: 2.6 (ref 2.0–3.0)

## 2017-12-21 NOTE — Patient Instructions (Signed)
Description   Continue on same dosage 1 tablet daily except 1/2 tablet on Tuesdays. Recheck in 2 weeks. Call if any changes [phone]

## 2018-01-01 DIAGNOSIS — D485 Neoplasm of uncertain behavior of skin: Secondary | ICD-10-CM | POA: Diagnosis not present

## 2018-01-03 ENCOUNTER — Ambulatory Visit (INDEPENDENT_AMBULATORY_CARE_PROVIDER_SITE_OTHER): Payer: Medicare Other | Admitting: *Deleted

## 2018-01-03 DIAGNOSIS — Z5181 Encounter for therapeutic drug level monitoring: Secondary | ICD-10-CM | POA: Diagnosis not present

## 2018-01-03 DIAGNOSIS — I214 Non-ST elevation (NSTEMI) myocardial infarction: Secondary | ICD-10-CM

## 2018-01-03 DIAGNOSIS — M5416 Radiculopathy, lumbar region: Secondary | ICD-10-CM | POA: Diagnosis not present

## 2018-01-03 DIAGNOSIS — M48061 Spinal stenosis, lumbar region without neurogenic claudication: Secondary | ICD-10-CM | POA: Diagnosis not present

## 2018-01-03 LAB — POCT INR: INR: 5.2 — AB (ref 2.0–3.0)

## 2018-01-03 NOTE — Patient Instructions (Signed)
Description   Skip today and tomorrow's dose, then start taking 1 tablet everyday except 1/2 tablet on Mondays, Wednesdays and Fridays.  Recheck in 1 week. Call if any changes [phone]. Seek immediate medical attention for any unusual signs of bleeding.

## 2018-01-10 ENCOUNTER — Ambulatory Visit (INDEPENDENT_AMBULATORY_CARE_PROVIDER_SITE_OTHER): Payer: Medicare Other | Admitting: Pharmacist

## 2018-01-10 DIAGNOSIS — I214 Non-ST elevation (NSTEMI) myocardial infarction: Secondary | ICD-10-CM

## 2018-01-10 DIAGNOSIS — Z5181 Encounter for therapeutic drug level monitoring: Secondary | ICD-10-CM | POA: Diagnosis not present

## 2018-01-10 DIAGNOSIS — D485 Neoplasm of uncertain behavior of skin: Secondary | ICD-10-CM | POA: Diagnosis not present

## 2018-01-10 DIAGNOSIS — C44729 Squamous cell carcinoma of skin of left lower limb, including hip: Secondary | ICD-10-CM | POA: Diagnosis not present

## 2018-01-10 LAB — POCT INR: INR: 2.2 (ref 2.0–3.0)

## 2018-01-10 NOTE — Patient Instructions (Signed)
Description   Continue taking 1 tablet everyday except 1/2 tablet on Mondays, Wednesdays and Fridays.  Recheck in 2 weeks. Call if any changes [phone]. Seek immediate medical attention for any unusual signs of bleeding.

## 2018-01-11 DIAGNOSIS — N3000 Acute cystitis without hematuria: Secondary | ICD-10-CM | POA: Diagnosis not present

## 2018-01-15 DIAGNOSIS — N3000 Acute cystitis without hematuria: Secondary | ICD-10-CM | POA: Diagnosis not present

## 2018-01-16 ENCOUNTER — Ambulatory Visit (INDEPENDENT_AMBULATORY_CARE_PROVIDER_SITE_OTHER): Payer: Medicare Other | Admitting: *Deleted

## 2018-01-16 DIAGNOSIS — Z5181 Encounter for therapeutic drug level monitoring: Secondary | ICD-10-CM

## 2018-01-16 DIAGNOSIS — N3000 Acute cystitis without hematuria: Secondary | ICD-10-CM | POA: Diagnosis not present

## 2018-01-16 DIAGNOSIS — I214 Non-ST elevation (NSTEMI) myocardial infarction: Secondary | ICD-10-CM

## 2018-01-16 LAB — POCT INR: INR: 2.8 (ref 2.0–3.0)

## 2018-01-16 NOTE — Patient Instructions (Addendum)
Description   Continue taking 1 tablet everyday except 1/2 tablet on Mondays, Wednesdays and Fridays.  Recheck in 3 weeks. Call if any changes [phone].

## 2018-01-17 DIAGNOSIS — N3000 Acute cystitis without hematuria: Secondary | ICD-10-CM | POA: Diagnosis not present

## 2018-01-23 DIAGNOSIS — S91302A Unspecified open wound, left foot, initial encounter: Secondary | ICD-10-CM | POA: Diagnosis not present

## 2018-01-23 DIAGNOSIS — L84 Corns and callosities: Secondary | ICD-10-CM | POA: Diagnosis not present

## 2018-01-24 ENCOUNTER — Other Ambulatory Visit: Payer: Self-pay | Admitting: Cardiovascular Disease

## 2018-01-29 ENCOUNTER — Emergency Department (HOSPITAL_COMMUNITY): Payer: Medicare Other

## 2018-01-29 ENCOUNTER — Encounter (HOSPITAL_COMMUNITY): Payer: Self-pay | Admitting: Emergency Medicine

## 2018-01-29 ENCOUNTER — Telehealth: Payer: Self-pay | Admitting: Cardiovascular Disease

## 2018-01-29 ENCOUNTER — Inpatient Hospital Stay (HOSPITAL_COMMUNITY)
Admission: EM | Admit: 2018-01-29 | Discharge: 2018-02-02 | DRG: 384 | Disposition: A | Discharge status: Home / Self Care | Payer: Medicare Other | Attending: Internal Medicine | Admitting: Internal Medicine

## 2018-01-29 DIAGNOSIS — I130 Hypertensive heart and chronic kidney disease with heart failure and stage 1 through stage 4 chronic kidney disease, or unspecified chronic kidney disease: Secondary | ICD-10-CM | POA: Diagnosis not present

## 2018-01-29 DIAGNOSIS — N183 Chronic kidney disease, stage 3 unspecified: Secondary | ICD-10-CM | POA: Diagnosis present

## 2018-01-29 DIAGNOSIS — Z8673 Personal history of transient ischemic attack (TIA), and cerebral infarction without residual deficits: Secondary | ICD-10-CM

## 2018-01-29 DIAGNOSIS — E785 Hyperlipidemia, unspecified: Secondary | ICD-10-CM | POA: Diagnosis present

## 2018-01-29 DIAGNOSIS — K210 Gastro-esophageal reflux disease with esophagitis: Secondary | ICD-10-CM | POA: Diagnosis present

## 2018-01-29 DIAGNOSIS — Z974 Presence of external hearing-aid: Secondary | ICD-10-CM

## 2018-01-29 DIAGNOSIS — I10 Essential (primary) hypertension: Secondary | ICD-10-CM | POA: Diagnosis present

## 2018-01-29 DIAGNOSIS — I482 Chronic atrial fibrillation, unspecified: Secondary | ICD-10-CM | POA: Diagnosis present

## 2018-01-29 DIAGNOSIS — Z8719 Personal history of other diseases of the digestive system: Secondary | ICD-10-CM

## 2018-01-29 DIAGNOSIS — Z881 Allergy status to other antibiotic agents status: Secondary | ICD-10-CM

## 2018-01-29 DIAGNOSIS — K5900 Constipation, unspecified: Secondary | ICD-10-CM | POA: Diagnosis present

## 2018-01-29 DIAGNOSIS — K319 Disease of stomach and duodenum, unspecified: Secondary | ICD-10-CM | POA: Diagnosis present

## 2018-01-29 DIAGNOSIS — I2510 Atherosclerotic heart disease of native coronary artery without angina pectoris: Secondary | ICD-10-CM | POA: Diagnosis present

## 2018-01-29 DIAGNOSIS — H9190 Unspecified hearing loss, unspecified ear: Secondary | ICD-10-CM | POA: Diagnosis present

## 2018-01-29 DIAGNOSIS — I4821 Permanent atrial fibrillation: Secondary | ICD-10-CM | POA: Diagnosis not present

## 2018-01-29 DIAGNOSIS — R1013 Epigastric pain: Secondary | ICD-10-CM | POA: Diagnosis present

## 2018-01-29 DIAGNOSIS — Z888 Allergy status to other drugs, medicaments and biological substances status: Secondary | ICD-10-CM

## 2018-01-29 DIAGNOSIS — E222 Syndrome of inappropriate secretion of antidiuretic hormone: Secondary | ICD-10-CM | POA: Diagnosis not present

## 2018-01-29 DIAGNOSIS — Z885 Allergy status to narcotic agent status: Secondary | ICD-10-CM

## 2018-01-29 DIAGNOSIS — R0789 Other chest pain: Secondary | ICD-10-CM | POA: Diagnosis not present

## 2018-01-29 DIAGNOSIS — I5032 Chronic diastolic (congestive) heart failure: Secondary | ICD-10-CM | POA: Diagnosis present

## 2018-01-29 DIAGNOSIS — K259 Gastric ulcer, unspecified as acute or chronic, without hemorrhage or perforation: Principal | ICD-10-CM | POA: Diagnosis present

## 2018-01-29 DIAGNOSIS — F419 Anxiety disorder, unspecified: Secondary | ICD-10-CM | POA: Diagnosis present

## 2018-01-29 DIAGNOSIS — Z66 Do not resuscitate: Secondary | ICD-10-CM | POA: Diagnosis present

## 2018-01-29 DIAGNOSIS — I8500 Esophageal varices without bleeding: Secondary | ICD-10-CM | POA: Diagnosis present

## 2018-01-29 DIAGNOSIS — I252 Old myocardial infarction: Secondary | ICD-10-CM

## 2018-01-29 DIAGNOSIS — Z79899 Other long term (current) drug therapy: Secondary | ICD-10-CM

## 2018-01-29 DIAGNOSIS — R079 Chest pain, unspecified: Secondary | ICD-10-CM | POA: Diagnosis not present

## 2018-01-29 DIAGNOSIS — Z7901 Long term (current) use of anticoagulants: Secondary | ICD-10-CM

## 2018-01-29 DIAGNOSIS — T18128A Food in esophagus causing other injury, initial encounter: Secondary | ICD-10-CM | POA: Diagnosis present

## 2018-01-29 DIAGNOSIS — E871 Hypo-osmolality and hyponatremia: Secondary | ICD-10-CM | POA: Diagnosis present

## 2018-01-29 DIAGNOSIS — Z955 Presence of coronary angioplasty implant and graft: Secondary | ICD-10-CM

## 2018-01-29 DIAGNOSIS — Z87891 Personal history of nicotine dependence: Secondary | ICD-10-CM

## 2018-01-29 DIAGNOSIS — Z882 Allergy status to sulfonamides status: Secondary | ICD-10-CM

## 2018-01-29 DIAGNOSIS — N3010 Interstitial cystitis (chronic) without hematuria: Secondary | ICD-10-CM | POA: Diagnosis present

## 2018-01-29 DIAGNOSIS — I4510 Unspecified right bundle-branch block: Secondary | ICD-10-CM | POA: Diagnosis present

## 2018-01-29 DIAGNOSIS — Z952 Presence of prosthetic heart valve: Secondary | ICD-10-CM

## 2018-01-29 LAB — BASIC METABOLIC PANEL
ANION GAP: 13 (ref 5–15)
BUN: 21 mg/dL (ref 8–23)
CHLORIDE: 89 mmol/L — AB (ref 98–111)
CO2: 25 mmol/L (ref 22–32)
Calcium: 9.2 mg/dL (ref 8.9–10.3)
Creatinine, Ser: 1.11 mg/dL — ABNORMAL HIGH (ref 0.44–1.00)
GFR calc Af Amer: 50 mL/min — ABNORMAL LOW (ref >60)
GFR, EST NON AFRICAN AMERICAN: 43 mL/min — AB (ref >60)
GLUCOSE: 104 mg/dL — AB (ref 70–99)
POTASSIUM: 3.9 mmol/L (ref 3.5–5.1)
Sodium: 127 mmol/L — ABNORMAL LOW (ref 135–145)

## 2018-01-29 LAB — CBC
HCT: 41.8 % (ref 36.0–46.0)
HEMOGLOBIN: 13.8 g/dL (ref 12.0–15.0)
MCH: 28.9 pg (ref 26.0–34.0)
MCHC: 33 g/dL (ref 30.0–36.0)
MCV: 87.6 fL (ref 80.0–100.0)
Platelets: 196 10*3/uL (ref 150–400)
RBC: 4.77 MIL/uL (ref 3.87–5.11)
RDW: 14.3 % (ref 11.5–15.5)
WBC: 8.1 10*3/uL (ref 4.0–10.5)
nRBC: 0 % (ref 0.0–0.2)

## 2018-01-29 LAB — I-STAT TROPONIN, ED: TROPONIN I, POC: 0 ng/mL (ref 0.00–0.08)

## 2018-01-29 NOTE — Telephone Encounter (Signed)
New Message   Pt's daughter states she is calling because she wants Dr. Acie Fredrickson to know that her mother is about to be admitted into the hospital with a possible heart attack. Please call

## 2018-01-29 NOTE — ED Notes (Signed)
Daughter concerned with her BP, stated she could have a stroke out here. VAN negative.

## 2018-01-29 NOTE — ED Triage Notes (Signed)
Pt presents with central CP and dizziness x 1 wk; pt states she has hx of aortic valve replacement, macular degeneration, and MI; states this CP is worse than previous MI;

## 2018-01-29 NOTE — ED Notes (Signed)
Pt. Stated, I have had some chest pressure more than usual for the last 20 minutes.  Pt rates pain at 10. Repeat an EKG

## 2018-01-29 NOTE — ED Notes (Signed)
Pt came back to triage complaining that CP went back to a 10/10 at this time.  Second EKG done at this time.

## 2018-01-30 ENCOUNTER — Encounter (HOSPITAL_COMMUNITY): Payer: Self-pay | Admitting: Family Medicine

## 2018-01-30 ENCOUNTER — Other Ambulatory Visit: Payer: Self-pay

## 2018-01-30 DIAGNOSIS — N183 Chronic kidney disease, stage 3 unspecified: Secondary | ICD-10-CM | POA: Diagnosis present

## 2018-01-30 DIAGNOSIS — I8500 Esophageal varices without bleeding: Secondary | ICD-10-CM | POA: Diagnosis not present

## 2018-01-30 DIAGNOSIS — I482 Chronic atrial fibrillation, unspecified: Secondary | ICD-10-CM | POA: Diagnosis not present

## 2018-01-30 DIAGNOSIS — I10 Essential (primary) hypertension: Secondary | ICD-10-CM | POA: Diagnosis not present

## 2018-01-30 DIAGNOSIS — I2510 Atherosclerotic heart disease of native coronary artery without angina pectoris: Secondary | ICD-10-CM | POA: Diagnosis not present

## 2018-01-30 DIAGNOSIS — E222 Syndrome of inappropriate secretion of antidiuretic hormone: Secondary | ICD-10-CM | POA: Diagnosis not present

## 2018-01-30 DIAGNOSIS — Z66 Do not resuscitate: Secondary | ICD-10-CM | POA: Diagnosis not present

## 2018-01-30 DIAGNOSIS — R0789 Other chest pain: Secondary | ICD-10-CM | POA: Diagnosis not present

## 2018-01-30 DIAGNOSIS — I4821 Permanent atrial fibrillation: Secondary | ICD-10-CM | POA: Diagnosis not present

## 2018-01-30 DIAGNOSIS — T18128A Food in esophagus causing other injury, initial encounter: Secondary | ICD-10-CM | POA: Diagnosis not present

## 2018-01-30 DIAGNOSIS — Z952 Presence of prosthetic heart valve: Secondary | ICD-10-CM | POA: Diagnosis not present

## 2018-01-30 DIAGNOSIS — K259 Gastric ulcer, unspecified as acute or chronic, without hemorrhage or perforation: Secondary | ICD-10-CM | POA: Diagnosis not present

## 2018-01-30 DIAGNOSIS — I130 Hypertensive heart and chronic kidney disease with heart failure and stage 1 through stage 4 chronic kidney disease, or unspecified chronic kidney disease: Secondary | ICD-10-CM | POA: Diagnosis not present

## 2018-01-30 DIAGNOSIS — Z8719 Personal history of other diseases of the digestive system: Secondary | ICD-10-CM

## 2018-01-30 DIAGNOSIS — F419 Anxiety disorder, unspecified: Secondary | ICD-10-CM | POA: Diagnosis not present

## 2018-01-30 DIAGNOSIS — E871 Hypo-osmolality and hyponatremia: Secondary | ICD-10-CM | POA: Diagnosis not present

## 2018-01-30 DIAGNOSIS — R079 Chest pain, unspecified: Secondary | ICD-10-CM

## 2018-01-30 DIAGNOSIS — I5032 Chronic diastolic (congestive) heart failure: Secondary | ICD-10-CM | POA: Diagnosis not present

## 2018-01-30 LAB — BASIC METABOLIC PANEL
ANION GAP: 8 (ref 5–15)
BUN: 11 mg/dL (ref 8–23)
CALCIUM: 8.7 mg/dL — AB (ref 8.9–10.3)
CHLORIDE: 95 mmol/L — AB (ref 98–111)
CO2: 24 mmol/L (ref 22–32)
Creatinine, Ser: 0.74 mg/dL (ref 0.44–1.00)
GFR calc non Af Amer: >60 mL/min (ref >60)
GLUCOSE: 94 mg/dL (ref 70–99)
POTASSIUM: 3.8 mmol/L (ref 3.5–5.1)
Sodium: 127 mmol/L — ABNORMAL LOW (ref 135–145)

## 2018-01-30 LAB — PROTIME-INR
INR: 2.08
PROTHROMBIN TIME: 23.1 s — AB (ref 11.4–15.2)

## 2018-01-30 LAB — POCT I-STAT TROPONIN I: TROPONIN I, POC: 0 ng/mL (ref 0.00–0.08)

## 2018-01-30 LAB — TROPONIN I
Troponin I: <0.03 ng/mL (ref <0.03)
Troponin I: <0.03 ng/mL (ref <0.03)

## 2018-01-30 LAB — SODIUM, URINE, RANDOM: SODIUM UR: 45 mmol/L

## 2018-01-30 LAB — OSMOLALITY, URINE: Osmolality, Ur: 228 mOsm/kg — ABNORMAL LOW (ref 300–900)

## 2018-01-30 MED ORDER — ASPIRIN EC 81 MG PO TBEC
81.0000 mg | DELAYED_RELEASE_TABLET | Freq: Every day | ORAL | Status: DC
  Administered 2018-01-31 – 2018-02-02 (×3): 81 mg via ORAL
  Filled 2018-01-30 (×3): qty 1

## 2018-01-30 MED ORDER — FAMOTIDINE 20 MG PO TABS
20.0000 mg | ORAL_TABLET | Freq: Every day | ORAL | Status: DC
  Administered 2018-01-30 – 2018-02-01 (×3): 20 mg via ORAL
  Filled 2018-01-30 (×3): qty 1

## 2018-01-30 MED ORDER — DILTIAZEM HCL 60 MG PO TABS
120.0000 mg | ORAL_TABLET | Freq: Two times a day (BID) | ORAL | Status: DC
  Administered 2018-01-30 – 2018-02-02 (×7): 120 mg via ORAL
  Filled 2018-01-30 (×7): qty 2

## 2018-01-30 MED ORDER — HYDRALAZINE HCL 20 MG/ML IJ SOLN: 5.0000 mg | INTRAMUSCULAR | Status: DC | PRN

## 2018-01-30 MED ORDER — SODIUM CHLORIDE 0.9 % IV SOLN
INTRAVENOUS | Status: DC
  Administered 2018-01-30: 15:00:00 via INTRAVENOUS

## 2018-01-30 MED ORDER — NITROGLYCERIN 0.4 MG SL SUBL
0.4000 mg | SUBLINGUAL_TABLET | SUBLINGUAL | Status: DC | PRN
  Administered 2018-01-30: 0.4 mg via SUBLINGUAL
  Filled 2018-01-30 (×2): qty 1

## 2018-01-30 MED ORDER — PROSIGHT PO TABS
2.0000 | ORAL_TABLET | Freq: Two times a day (BID) | ORAL | Status: DC
  Administered 2018-01-30 – 2018-02-02 (×7): 2 via ORAL
  Filled 2018-01-30 (×7): qty 2

## 2018-01-30 MED ORDER — LIDOCAINE VISCOUS HCL 2 % MT SOLN
15.0000 mL | Freq: Once | OROMUCOSAL | Status: AC
  Administered 2018-01-30: 15 mL via ORAL
  Filled 2018-01-30: qty 15

## 2018-01-30 MED ORDER — HYDROCODONE-ACETAMINOPHEN 5-325 MG PO TABS
1.0000 | ORAL_TABLET | Freq: Four times a day (QID) | ORAL | Status: DC | PRN
  Administered 2018-01-30 – 2018-02-02 (×9): 1 via ORAL
  Filled 2018-01-30 (×9): qty 1

## 2018-01-30 MED ORDER — ASPIRIN 81 MG PO CHEW
324.0000 mg | CHEWABLE_TABLET | Freq: Once | ORAL | Status: DC
  Filled 2018-01-30: qty 4

## 2018-01-30 MED ORDER — LISINOPRIL 40 MG PO TABS
40.0000 mg | ORAL_TABLET | Freq: Every day | ORAL | Status: DC
  Administered 2018-01-30 – 2018-02-02 (×4): 40 mg via ORAL
  Filled 2018-01-30 (×4): qty 1

## 2018-01-30 MED ORDER — ALUM & MAG HYDROXIDE-SIMETH 200-200-20 MG/5ML PO SUSP
30.0000 mL | Freq: Once | ORAL | Status: AC
  Administered 2018-01-30: 30 mL via ORAL
  Filled 2018-01-30: qty 30

## 2018-01-30 MED ORDER — DIAZEPAM 5 MG PO TABS: 5.0000 mg | ORAL_TABLET | Freq: Every evening | ORAL | Status: DC | PRN

## 2018-01-30 MED ORDER — ACETAMINOPHEN 325 MG PO TABS: 650.0000 mg | ORAL_TABLET | ORAL | Status: DC | PRN

## 2018-01-30 MED ORDER — ATORVASTATIN CALCIUM 20 MG PO TABS
20.0000 mg | ORAL_TABLET | Freq: Every day | ORAL | Status: DC
  Administered 2018-01-30 – 2018-02-01 (×3): 20 mg via ORAL
  Filled 2018-01-30 (×3): qty 1

## 2018-01-30 MED ORDER — HYOSCYAMINE SULFATE 0.125 MG PO TBDP
0.1250 mg | ORAL_TABLET | ORAL | Status: DC | PRN
  Filled 2018-01-30: qty 1

## 2018-01-30 MED ORDER — BUSPIRONE HCL 5 MG PO TABS
15.0000 mg | ORAL_TABLET | Freq: Two times a day (BID) | ORAL | Status: DC
  Administered 2018-01-30 – 2018-02-02 (×7): 15 mg via ORAL
  Filled 2018-01-30 (×7): qty 3

## 2018-01-30 MED ORDER — WARFARIN - PHARMACIST DOSING INPATIENT: Freq: Every day | Status: DC

## 2018-01-30 MED ORDER — MORPHINE SULFATE (PF) 2 MG/ML IV SOLN: 1.0000 mg | INTRAVENOUS | Status: DC | PRN

## 2018-01-30 MED ORDER — WARFARIN SODIUM 2.5 MG PO TABS
2.5000 mg | ORAL_TABLET | Freq: Once | ORAL | Status: AC
  Administered 2018-01-30: 2.5 mg via ORAL
  Filled 2018-01-30: qty 1

## 2018-01-30 MED ORDER — OXYBUTYNIN CHLORIDE 5 MG PO TABS
10.0000 mg | ORAL_TABLET | Freq: Every day | ORAL | Status: DC
  Administered 2018-01-30 – 2018-02-01 (×3): 10 mg via ORAL
  Filled 2018-01-30 (×3): qty 2

## 2018-01-30 MED ORDER — ONDANSETRON HCL 4 MG PO TABS
4.0000 mg | ORAL_TABLET | Freq: Three times a day (TID) | ORAL | Status: DC | PRN
  Administered 2018-01-30 – 2018-02-02 (×2): 4 mg via ORAL
  Filled 2018-01-30 (×2): qty 1

## 2018-01-30 MED ORDER — ONDANSETRON HCL 4 MG/2ML IJ SOLN
4.0000 mg | Freq: Four times a day (QID) | INTRAMUSCULAR | Status: DC | PRN
  Administered 2018-01-30 – 2018-01-31 (×4): 4 mg via INTRAVENOUS
  Filled 2018-01-30 (×4): qty 2

## 2018-01-30 MED ORDER — OXYBUTYNIN CHLORIDE 5 MG PO TABS
5.0000 mg | ORAL_TABLET | Freq: Every day | ORAL | Status: DC
  Administered 2018-01-30 – 2018-02-02 (×4): 5 mg via ORAL
  Filled 2018-01-30 (×4): qty 1

## 2018-01-30 MED ORDER — ONDANSETRON HCL 4 MG/2ML IJ SOLN: 4.0000 mg | Freq: Four times a day (QID) | INTRAMUSCULAR | Status: DC | PRN

## 2018-01-30 MED ORDER — CYCLOSPORINE 0.05 % OP EMUL
1.0000 [drp] | Freq: Two times a day (BID) | OPHTHALMIC | Status: DC
  Administered 2018-01-30 – 2018-02-02 (×7): 1 [drp] via OPHTHALMIC
  Filled 2018-01-30 (×9): qty 1

## 2018-01-30 MED ORDER — POLYVINYL ALCOHOL 1.4 % OP SOLN
1.0000 [drp] | Freq: Two times a day (BID) | OPHTHALMIC | Status: DC
  Administered 2018-01-30 – 2018-02-02 (×7): 1 [drp] via OPHTHALMIC
  Filled 2018-01-30 (×2): qty 15

## 2018-01-30 MED ORDER — PANTOPRAZOLE SODIUM 40 MG PO TBEC
40.0000 mg | DELAYED_RELEASE_TABLET | Freq: Two times a day (BID) | ORAL | Status: DC
  Administered 2018-01-30 – 2018-02-02 (×7): 40 mg via ORAL
  Filled 2018-01-30 (×8): qty 1

## 2018-01-30 NOTE — Consult Note (Addendum)
Cardiology Consultation:   Patient ID: Brittany [name] MRN: [id number]; DOB: June 22, 1927  Admit date: 01/29/2018 Date of Consult: 01/30/2018  Primary Care Provider: Mayra Neer, MD Primary Cardiologist: Mertie Moores, MD  Primary Electrophysiologist:  None    Patient Profile:   Brittany [name] is a 84 y.o. female with a hx of Afib on coumadin, AVR, chronic diastolic heart failure, HTN, CKD stage III, emphysema with lung nodules, and hx of stroke with visual deficits who is being seen today for the evaluation of chest pain at the request of Dr. Evangeline Gula.  History of Present Illness:   Brittany [name] has a history of NSTEMI in 2015. Left heart cath showed severe fibrotic disease in the ostial RCA which was the culprit lesion treated with DES. She was a nonresponder to plavix and subsequently maintained on ASA and brilinta. She was also started on cardizem 120 mg for Afib RVR and xarelto changed to coumadin. She was last seen in clinic by Dr. Acie Fredrickson on 11/06/17. She was doing well at that time on torsemide.   She presented to [facility]30/18 with complaints of chest pain. She states that over the past 4-5 days she has not felt well, describing fatigue. She is generally active and independent. She was out shopping with her caregiver yesterday when she developed substernal chest pain that radiated down her left arm.  Chest pain was described as a chest pressure and was associated with shortness of breath and nausea. No diaphoresis or vomiting. Chest pressure was relieved with rest. She asked her caregiver to bring her to the hospital. She has not had a recurrence of chest pressure since being in the hospital, but also has not exerted herself. She states that this is the same chest pressure she experienced prior to her heart cath in 2015. She denies palpitations and syncope.    In the ER, troponin x 2 negative, EKG with Q waves inferior (new) and anteroseptal leads (old). Creatinine 1.11, K 3.9.  Hyponatremic at 127. INR was 2.08.  Cardiology was asked to consult.   She is amenable to heart catheterization, but states she does not want to take ASA unless absolutely necessary for her hiatal hernia.    Past Medical History:  Diagnosis Date  . Abnormal liver diagnostic imaging    suspected cirrhosis  . Arthritis   . Atrial fibrillation ([place])   . Chronic back pain   . Diverticulosis   . Dyslipidemia    takes Lipitor daily  . Dysrhythmia    Atrial Fibrillation  . Foot drop    SINCE 1987  . GERD (gastroesophageal reflux disease)   . H/O hiatal hernia   . Hearing impaired    Bilateral hearing aids  . History of bacterial endocarditis   . History of gastritis   . History of stomach ulcers    many yrs ago  . History of TIA (transient ischemic attack)    2013-- RESIDUAL PERIPHERAL VISION RIGHT EYE--  RESOLVED  . Hypertension   . IC (interstitial cystitis)   . Pulmonary hypertension ([place]) 10/2013   Based on TTE  . S/P aortic valve replacement    2005  . Urine incontinence     Past Surgical History:  Procedure Laterality Date  . ABDOMINAL HYSTERECTOMY  1967  . AORTIC VALVE REPLACEMENT  09-29-2003    [facility] pericardial tissue valve  . APPENDECTOMY  1933  . CARDIAC CATHETERIZATION  07-15-2003 DR HELEN PRESTON   MODERATE TO MODERATELY SEVERE CALCIFIC AORTIC STENOSIS/  NORMAL CORONARY ARTERIES  . CARDIOVASCULAR STRESS TEST  01-30-2012  DR NASHER   NORMAL NUCLEAR STUDY/  EF 73%/  NORMAL LVF  . CARPAL TUNNEL RELEASE Bilateral   . CATARACT EXTRACTION W/ INTRAOCULAR LENS  IMPLANT, BILATERAL    . CHOLECYSTECTOMY  1993  . CYSTO WITH HYDRODISTENSION N/A 02/05/2013   Procedure: CYSTOSCOPY/HYDRODISTENSION;  Surgeon: Irine Seal, MD;  Location: [facility];  Service: Urology;  Laterality: N/A;  . CYSTO/ HYDRODISTENTION/ INSTILLATION CLORPACTIN  MULTIPLE  last one 2009  . DILATION AND CURETTAGE OF UTERUS    . ERCP N/A 08/10/2012   Procedure: ENDOSCOPIC  RETROGRADE CHOLANGIOPANCREATOGRAPHY (ERCP);  Surgeon: Inda Castle, MD;  Location: [facility];  Service: Gastroenterology;  Laterality: N/A;  . ESOPHAGOGASTRODUODENOSCOPY (EGD) WITH PROPOFOL N/A 08/18/2014   Procedure: ESOPHAGOGASTRODUODENOSCOPY (EGD) WITH PROPOFOL;  Surgeon: Inda Castle, MD;  Location: [facility];  Service: Endoscopy;  Laterality: N/A;  . LEFT HEART CATHETERIZATION WITH CORONARY ANGIOGRAM N/A 10/18/2013   Procedure: LEFT HEART CATHETERIZATION WITH CORONARY ANGIOGRAM;  Surgeon: Jettie Booze, MD;  Location: [facility] C[facility] LAB;  Service: Cardiovascular;  Laterality: N/A;  . LUMBAR [place] &  2007  . MACROPLASTIQUE URETHRAL IMPLANTATION  08-27-2009  . MINI-OPEN RIGHT ROTATOR CUFF REPAIR  07-12-2001  . RIGHT SHOULDER ARTHROSCOPY W/ DEBRIDEMENT ROTATOR CUFF AND LABRAL TEAR/ ACROMINOPLASTY/ DISTAL CLAVICLE EXCISION/ CA LIGAMENT RELEASE  10-08-1999  . TONSILLECTOMY AND ADENOIDECTOMY    . TRANSTHORACIC ECHOCARDIOGRAM  08-10-2011   MILD LVH/  EF 55-60%/  NORMAL AVR TISSUE/ MILD MR/  MODERATE DILATED LA/  MILD DILATED RA  . TRANSVAGINAL TAPE PROCEDURE  07-30-2002  . VEIN LIGATION  1969   RIGHT LOWER LEG     Home Medications:  Prior to Admission medications   Medication Sig Start Date End Date Taking? Authorizing Provider  atorvastatin (LIPITOR) 20 MG tablet Take 20 mg by mouth daily.   Yes [provider]  busPIRone (BUSPAR) 15 MG tablet TAKE ONE TABLET BY MOUTH TWICE A DAY Patient taking differently: Take 15 mg by mouth 2 (two) times daily.  12/15/17  Yes Armbruster, Carlota Raspberry, MD  cycloSPORINE (RESTASIS) 0.05 % ophthalmic emulsion Place 1 drop into both eyes 2 (two) times daily.   Yes [provider]  diazepam (VALIUM) 5 MG tablet Take 5 mg by mouth at bedtime as needed (sleep).    Yes [provider]  diltiazem (CARDIZEM) 120 MG tablet Take 120 mg by mouth 2 (two) times daily.   Yes [provider]  HYDROcodone-acetaminophen  (NORCO/VICODIN) 5-325 MG per tablet Take 1 tablet by mouth every 6 (six) hours as needed (pain).    Yes [provider]  hyoscyamine (LEVSIN, ANASPAZ) 0.125 MG tablet Take 0.125 mg by mouth every 4 (four) hours as needed for cramping.    Yes [provider]  ipratropium (ATROVENT) 0.06 % nasal spray Place 2 sprays into both nostrils 2 (two) times daily.    Yes [provider]  lisinopril (PRINIVIL,ZESTRIL) 40 MG tablet Take 40 mg by mouth daily.    Yes [provider]  Multiple Vitamins-Minerals (ICAPS AREDS 2) CAPS Take 1 capsule by mouth 2 (two) times daily.   Yes [provider]  ondansetron (ZOFRAN) 4 MG tablet Take 1 tablet (4 mg total) by mouth every 8 (eight) hours as needed for nausea. 04/18/17  Yes Armbruster, Carlota Raspberry, MD  oxybutynin (DITROPAN) 5 MG tablet Take 5-10 mg by mouth See admin instructions. Take 1 tablet (5 mg)  by mouth every morning and 2 tablets (10 mg) at night   Yes [provider]  pantoprazole (PROTONIX) 40 MG tablet Take 1 tablet (40 mg total) by mouth 2 (two) times daily. 12/04/17  Yes Armbruster, Carlota Raspberry, MD  Polyvinyl Alcohol-Povidone (REFRESH OP) Place 1 drop into both eyes 2 (two) times daily.   Yes [provider]  potassium chloride SA (K-DUR,KLOR-CON) 20 MEQ tablet Take 20 mEq by mouth daily as needed (ONLY WHEN TAKING TORSEMIDE).   Yes [provider]  promethazine (PHENERGAN) 12.5 MG tablet Take 1 tablet (12.5 mg total) by mouth every 6 (six) hours as needed for nausea or vomiting. 08/30/14  Yes Ghimire, Henreitta Leber, MD  ranitidine (ZANTAC) 150 MG tablet Take 150 mg by mouth at bedtime.    Yes [provider]  torsemide (DEMADEX) 20 MG tablet Take 20 mg by mouth daily.    Yes [provider]  warfarin (COUMADIN) 5 MG tablet TAKE AS DIRECTED BY COUMADIN CLINIC Patient taking differently: Take 2.5-5 mg by mouth See admin instructions. Take 1 tablet on Tuesday and Thursday then take  1/2 tablet all the other days 01/25/18  Yes Nahser, Wonda Cheng, MD    Inpatient Medications: Scheduled Meds: . aspirin  324 mg Oral Once  . [START ON 01/31/2018] aspirin EC  81 mg Oral Daily  . atorvastatin  20 mg Oral q1800  . busPIRone  15 mg Oral BID  . cycloSPORINE  1 drop Both Eyes BID  . diltiazem  120 mg Oral BID  . famotidine  20 mg Oral QHS  . lisinopril  40 mg Oral Daily  . multivitamin  2 tablet Oral BID  . oxybutynin  10 mg Oral QHS  . oxybutynin  5 mg Oral Daily  . pantoprazole  40 mg Oral BID  . polyvinyl alcohol  1 drop Both Eyes BID  . Warfarin - Pharmacist Dosing Inpatient   Does not apply q1800   Continuous Infusions:  PRN Meds: acetaminophen, diazepam, hyoscyamine, morphine injection, nitroGLYCERIN, ondansetron (ZOFRAN) IV  Allergies:    Allergies  Allergen Reactions  . Amlodipine Swelling and Other (See Comments)     Unspecified swelling reaction  . Amoxicillin Diarrhea, Nausea And Vomiting and Other (See Comments)    Has patient had a PCN reaction causing immediate rash, facial/tongue/throat swelling, SOB or lightheadedness with hypotension: No Has patient had a PCN reaction causing severe rash involving mucus membranes or skin necrosis: No Has patient had a PCN reaction that required hospitalization: No Has patient had a PCN reaction occurring within the last 10 years: No If all of the above answers are "NO", then may proceed with Cephalosporin use.  . Carafate [Sucralfate] Swelling and Other (See Comments)    Reaction:  Knee swelling/redness   . Cephalexin Diarrhea  . Codeine Nausea And Vomiting  . Irbesartan Swelling and Other (See Comments)    Reaction:  Facial/hand swelling and numbness   . Morphine And Related Other (See Comments)    Pt states that she has a history of addiction with this medication.      . Neurontin [Gabapentin] Swelling and Other (See Comments)    Reaction:  Leg swelling   . Nitrofurantoin Monohyd Macro Diarrhea and Nausea And  Vomiting  . Prednisone Other (See Comments)    Reaction:  Elevated BP  . Sulfa Antibiotics Rash    Social History:   Social History   Socioeconomic History  . Marital status: Widowed    Spouse name:  Not on file  . Number of children: 4  . Years of education: Not on file  . Highest education level: Not on file  Occupational History  . Occupation: retired  Scientific laboratory technician  . Financial resource strain: Not on file  . Food insecurity:    Worry: Not on file    Inability: Not on file  . Transportation needs:    Medical: Not on file    Non-medical: Not on file  Tobacco Use  . Smoking status: Former Smoker    Packs/day: 0.50    Years: 40.00    Pack years: 20.00    Types: Cigarettes    Start date: 05/06/1949    Last attempt to quit: 03/06/1990    Years since quitting: 27.9  . Smokeless tobacco: Never Used  Substance and Sexual Activity  . Alcohol use: No    Alcohol/week: 0.0 standard drinks    Comment: Remote social EtOH  . Drug use: No  . Sexual activity: Never  Lifestyle  . Physical activity:    Days per week: Not on file    Minutes per session: Not on file  . Stress: Not on file  Relationships  . Social connections:    Talks on phone: Not on file    Gets together: Not on file    Attends religious service: Not on file    Active member of club or organization: Not on file    Attends meetings of clubs or organizations: Not on file    Relationship status: Not on file  . Intimate partner violence:    Fear of current or ex partner: Not on file    Emotionally abused: Not on file    Physically abused: Not on file    Forced sexual activity: Not on file  Other Topics Concern  . Not on file  Social History Narrative   She is originally from [place], [place]. She has previously lived in [place] for 10 years. She has traveled to all 75 states as well as every [place] in [place]. Prior travel to [place], [place], [place]. Previously worked as an Optometrist. Remote exposure to a parakeet for 2  years in 48s. No known asbestos, mold, or hot tub exposure. She enjoys doing needle point & hand crafts.      Family History:    Family History  Problem Relation Age of Onset  . Heart disease Father   . Stomach cancer Maternal Grandmother   . Esophageal cancer Maternal Grandfather   . Bipolar disorder Son        Committed Suicide  . Coronary artery disease Brother   . Bipolar disorder Brother        commited suiside at 62yo  . Alcohol abuse Sister        sister #1  . Alcohol abuse Sister        sister #2  . Colon cancer Maternal Aunt   . Lung disease Neg Hx      ROS:  Please see the history of present illness.   All other ROS reviewed and negative.     Physical Exam/Data:   Vitals:   01/30/18 0130 01/30/18 0145 01/30/18 0222 01/30/18 0515  BP: 129/61  (!) 177/71 140/69  Pulse:  62 60   Resp:  20  16  Temp:   97.7 F (36.5 C)   TempSrc:   Axillary   SpO2:  97% 98%   Weight:   54.5 kg   Height:   5\' 1"  (1.549  m)     Intake/Output Summary (Last 24 hours) at 01/30/2018 0817 Last data filed at 01/30/2018 0715 Gross per 24 hour  Intake -  Output 200 ml  Net -200 ml   Filed Weights   01/29/18 1532 01/30/18 0222  Weight: 55.3 kg 54.5 kg   Body mass index is 22.69 kg/m.  General:  Well nourished, well developed, in no acute distress HEENT: normal Neck: no JVD Vascular: No carotid bruits  Cardiac: irregular rhythm, regular rate, soft murmur Lungs:  clear to auscultation bilaterally, no wheezing, rhonchi or rales, diminished in bases Abd: soft, nontender, no hepatomegaly  Ext: no edema Musculoskeletal:  No deformities, BUE and BLE strength normal and equal Skin: warm and dry  Neuro:  CNs 2-12 intact, no focal abnormalities noted Psych:  Normal affect   EKG:  The EKG was personally reviewed and demonstrates:  Atrial fibrillation Telemetry:  Telemetry was personally reviewed and demonstrates:  Afib  Relevant CV Studies:  Echo 07/21/17: Study  Conclusions - Left ventricle: The cavity size was normal. There was severe   concentric hypertrophy. Systolic function was normal. The   estimated ejection fraction was in the range of 60% to 65%. Wall   motion was normal; there were no regional wall motion   abnormalities. The study was not technically sufficient to allow   evaluation of LV diastolic dysfunction due to atrial   fibrillation. - Aortic valve: There is an [employer] pericardial tissue   AVR present. A pericardial bioprosthesis was present. The   leaflets are moderately thickened and calcified. There was mild   stenosis. Mean gradient (S): 14 mm Hg. - Mitral valve: Calcified annulus. There was trivial regurgitation. - Left atrium: The atrium was severely dilated. - Tricuspid valve: There was moderate regurgitation. - Pulmonic valve: There was mild regurgitation. - Pulmonary arteries: PA peak pressure: 38 mm Hg (S).  Impressions: - The right ventricular systolic pressure was increased consistent   with mild pulmonary hypertension.  Laboratory Data:  Chemistry Recent Labs  Lab 01/29/18 1545  NA 127*  K 3.9  CL 89*  CO2 25  GLUCOSE 104*  BUN 21  CREATININE 1.11*  CALCIUM 9.2  GFRNONAA 43*  GFRAA 50*  ANIONGAP 13    No results for input(s): PROT, ALBUMIN, AST, ALT, ALKPHOS, BILITOT in the last 168 hours. Hematology Recent Labs  Lab 01/29/18 1545  WBC 8.1  RBC 4.77  HGB 13.8  HCT 41.8  MCV 87.6  MCH 28.9  MCHC 33.0  RDW 14.3  PLT 196   Cardiac Enzymes Recent Labs  Lab 01/30/18 0306  TROPONINI <0.03    Recent Labs  Lab 01/29/18 1544 01/30/18 0134  TROPIPOC 0.00 0.00    BNPNo results for input(s): BNP, PROBNP in the last 168 hours.  DDimer No results for input(s): DDIMER in the last 168 hours.  Radiology/Studies:  Dg Chest 2 View  Result Date: 01/29/2018 CLINICAL DATA:  Chest pain EXAM: CHEST - 2 VIEW COMPARISON:  08/25/2017 FINDINGS: Post sternotomy changes with valve  prosthesis. Dense mitral calcification. Mild cardiomegaly. No pleural effusion or focal opacity. Aortic atherosclerosis. No pneumothorax. IMPRESSION: No active cardiopulmonary disease.  Mild cardiomegaly. Electronically Signed   By: Donavan Foil M.D.   On: 01/29/2018 16:55    Assessment and Plan:   1. Chest pain, hx of NSTEMI with DES to RCA - pt describes symptoms with typical and atypical symptoms, also complains of epigastric and left upper quadrant pain consistent with hiatal hernia -  troponin x 2 negative - EKG with Q waves inferior and anteroseptal leads - patient is very active and independent -  Will treat conservatively with GI cocktail, will give her a diet and monitor symptoms - repeat an EKG now   2. Atrial fibrillation - continue cardizem - continue coumadin for now    3. Chronic diastolic heart failure - echo with normal EF 07/2017 - euvolemic on exam   4. HTN - continue lisinopril - pressures have been controlled   5. S/P AVR 2005 - valve was stable on recent echo      For questions or updates, please contact [facility] Please consult [email or web address] for contact info under     Signed, Brittany Bottcher, PA  01/30/2018 8:17 AM  Personally seen and examined. Agree with above.  84 year old with known CAD status post RCA stent in 2015 in the setting of elevated troponin, non-ST elevation myocardial infarction being seen for the evaluation of chest pain, nausea, generalized fatigue.  Over the last week or so she is felt poor.  Nauseous at times.  Increased fatigue.  This morning when I examined her, she was complaining of epigastric discomfort, worse with palpation with associated nausea and some radiation up her chest wall.  No fevers chills.  No melena, no bleeding.  GEN: Elderly but well nourished, well developed, in no acute distress  HEENT: normal  Neck: no JVD, carotid bruits, or masses Cardiac: Irregularly irregular normal rate; no murmurs, rubs,  or gallops,no edema  Respiratory:  clear to auscultation bilaterally, normal work of breathing GI: soft, epigastric tenderness noted, nondistended, + BS MS: no deformity or atrophy  Skin: warm and dry, no rash Neuro:  Alert and Oriented x 3, Strength and sensation are intact Psych: euthymic mood, full affect  Labs: Troponin negative.  ECG without any significant ischemic changes personally reviewed.  Assessment and plan:  Chest pain/epigastric pain/nausea - Thankfully, troponin thus far is normal, no objective signs of myocardial injury/infarction or ischemia. - Currently quite tender to palpation in the epigastric region.  I think at this point, symptoms point towards a gastric etiology.  I see that she is on Protonix 40 mg twice daily.  We will write for a GI cocktail.  I will go ahead and give her a diet.  She is requesting food.  She does have a history of hiatal hernia.  At this point, I would hold off on any further cardiac testing unless there is clear objective evidence pointing [place] in the direction for further work-up.  Permanent atrial fibrillation - Continue with Coumadin therapy.  Aortic atherosclerosis -Continue with statin therapy.  Coronary artery disease status post MI 2015 -RCA stent.  Catheterization report reviewed.  Bioprosthetic aortic valve 2005 - Stable on echocardiogram 07/21/2017 personally reviewed.  Mild secondary pulmonary hypertension - Second to left heart disease, underlying lung disease.  Candee Furbish, MD

## 2018-01-30 NOTE — Progress Notes (Signed)
Patient's daughter expressed concern about treatment in ED and by physicians on the floor.  Dyanne Carrel, NP and Ailene Rud, RN notified.  Both came to speak with patient and daughter about concerns.

## 2018-01-30 NOTE — Progress Notes (Signed)
ANTICOAGULATION CONSULT NOTE - Initial Consult  Pharmacy Consult for Coumadin Indication: atrial fibrillation  Allergies  Allergen Reactions  . Amlodipine Swelling and Other (See Comments)     Unspecified swelling reaction  . Amoxicillin Diarrhea, Nausea And Vomiting and Other (See Comments)    Has patient had a PCN reaction causing immediate rash, facial/tongue/throat swelling, SOB or lightheadedness with hypotension: No Has patient had a PCN reaction causing severe rash involving mucus membranes or skin necrosis: No Has patient had a PCN reaction that required hospitalization: No Has patient had a PCN reaction occurring within the last 10 years: No If all of the above answers are "NO", then may proceed with Cephalosporin use.  . Carafate [Sucralfate] Swelling and Other (See Comments)    Reaction:  Knee swelling/redness   . Cephalexin Diarrhea  . Codeine Nausea And Vomiting  . Irbesartan Swelling and Other (See Comments)    Reaction:  Facial/hand swelling and numbness   . Morphine And Related Other (See Comments)    Pt states that she has a history of addiction with this medication.      . Neurontin [Gabapentin] Swelling and Other (See Comments)    Reaction:  Leg swelling   . Nitrofurantoin Monohyd Macro Diarrhea and Nausea And Vomiting  . Prednisone Other (See Comments)    Reaction:  Elevated BP  . Sulfa Antibiotics Rash    Patient Measurements: Height: 5\' 1"  (154.9 cm) Weight: 120 lb 1.6 oz (54.5 kg) IBW/kg (Calculated) : 47.8  Vital Signs: Temp: 97.7 F (36.5 C) (10/22 0222) Temp Source: Axillary (10/22 0222) BP: 132/86 (10/22 1004) Pulse Rate: 60 (10/22 0222)  Labs: Recent Labs    01/29/18 1545 01/30/18 0105 01/30/18 0306 01/30/18 0814  HGB 13.8  --   --   --   HCT 41.8  --   --   --   PLT 196  --   --   --   LABPROT  --  23.1*  --   --   INR  --  2.08  --   --   CREATININE 1.11*  --   --  0.74  TROPONINI  --   --  <0.03 <0.03    Estimated Creatinine  Clearance: 36.7 mL/min (by C-G formula based on SCr of 0.74 mg/dL).   Medical History: Past Medical History:  Diagnosis Date  . Abnormal liver diagnostic imaging    suspected cirrhosis  . Arthritis   . Atrial fibrillation ([place])   . Chronic back pain   . Diverticulosis   . Dyslipidemia    takes Lipitor daily  . Dysrhythmia    Atrial Fibrillation  . Foot drop    SINCE 1987  . GERD (gastroesophageal reflux disease)   . H/O hiatal hernia   . Hearing impaired    Bilateral hearing aids  . History of bacterial endocarditis   . History of gastritis   . History of stomach ulcers    many yrs ago  . History of TIA (transient ischemic attack)    2013-- RESIDUAL PERIPHERAL VISION RIGHT EYE--  RESOLVED  . Hypertension   . IC (interstitial cystitis)   . Pulmonary hypertension ([place]) 10/2013   Based on TTE  . S/P aortic valve replacement    2005  . Urine incontinence     Medications:  Medications Prior to Admission  Medication Sig Dispense Refill Last Dose  . atorvastatin (LIPITOR) 20 MG tablet Take 20 mg by mouth daily.   01/29/2018 at Unknown time  .  busPIRone (BUSPAR) 15 MG tablet TAKE ONE TABLET BY MOUTH TWICE A DAY (Patient taking differently: Take 15 mg by mouth 2 (two) times daily. ) 180 tablet 0 01/29/2018 at Unknown time  . cycloSPORINE (RESTASIS) 0.05 % ophthalmic emulsion Place 1 drop into both eyes 2 (two) times daily.   01/29/2018 at Unknown time  . diazepam (VALIUM) 5 MG tablet Take 5 mg by mouth at bedtime as needed (sleep).    Past Week at Unknown time  . diltiazem (CARDIZEM) 120 MG tablet Take 120 mg by mouth 2 (two) times daily.   01/29/2018 at Unknown time  . HYDROcodone-acetaminophen (NORCO/VICODIN) 5-325 MG per tablet Take 1 tablet by mouth every 6 (six) hours as needed (pain).    Past Week at Unknown time  . hyoscyamine (LEVSIN, ANASPAZ) 0.125 MG tablet Take 0.125 mg by mouth every 4 (four) hours as needed for cramping.    Past Week at Unknown time  . ipratropium  (ATROVENT) 0.06 % nasal spray Place 2 sprays into both nostrils 2 (two) times daily.    01/29/2018 at Unknown time  . lisinopril (PRINIVIL,ZESTRIL) 40 MG tablet Take 40 mg by mouth daily.    01/29/2018 at Unknown time  . Multiple Vitamins-Minerals (ICAPS AREDS 2) CAPS Take 1 capsule by mouth 2 (two) times daily.   01/29/2018 at Unknown time  . ondansetron (ZOFRAN) 4 MG tablet Take 1 tablet (4 mg total) by mouth every 8 (eight) hours as needed for nausea. 60 tablet 3 Past Month at Unknown time  . oxybutynin (DITROPAN) 5 MG tablet Take 5-10 mg by mouth See admin instructions. Take 1 tablet (5 mg) by mouth every morning and 2 tablets (10 mg) at night   01/29/2018 at Unknown time  . pantoprazole (PROTONIX) 40 MG tablet Take 1 tablet (40 mg total) by mouth 2 (two) times daily. 180 tablet 1 01/29/2018 at Unknown time  . Polyvinyl Alcohol-Povidone (REFRESH OP) Place 1 drop into both eyes 2 (two) times daily.   01/29/2018 at Unknown time  . potassium chloride SA (K-DUR,KLOR-CON) 20 MEQ tablet Take 20 mEq by mouth daily as needed (ONLY WHEN TAKING TORSEMIDE).   Past Month at Unknown time  . promethazine (PHENERGAN) 12.5 MG tablet Take 1 tablet (12.5 mg total) by mouth every 6 (six) hours as needed for nausea or vomiting. 30 tablet 0 Past Month at Unknown time  . ranitidine (ZANTAC) 150 MG tablet Take 150 mg by mouth at bedtime.    01/29/2018 at Unknown time  . torsemide (DEMADEX) 20 MG tablet Take 20 mg by mouth daily.    01/29/2018 at Unknown time  . warfarin (COUMADIN) 5 MG tablet TAKE AS DIRECTED BY COUMADIN CLINIC (Patient taking differently: Take 2.5-5 mg by mouth See admin instructions. Take 1 tablet on Tuesday and Thursday then take 1/2 tablet all the other days) 30 tablet 3 01/29/2018 at 1900   Scheduled:  . aspirin  324 mg Oral Once  . [START ON 01/31/2018] aspirin EC  81 mg Oral Daily  . atorvastatin  20 mg Oral q1800  . busPIRone  15 mg Oral BID  . cycloSPORINE  1 drop Both Eyes BID  . diltiazem   120 mg Oral BID  . famotidine  20 mg Oral QHS  . lisinopril  40 mg Oral Daily  . multivitamin  2 tablet Oral BID  . oxybutynin  10 mg Oral QHS  . oxybutynin  5 mg Oral Daily  . pantoprazole  40 mg Oral BID  .  polyvinyl alcohol  1 drop Both Eyes BID  . warfarin  2.5 mg Oral ONCE-1800  . Warfarin - Pharmacist Dosing Inpatient   Does not apply q1800    Assessment: 84yo female c/o CP and dizziness x1wk, initial w/u in ED unremarkable, admitted for further evaluation, to continue Coumadin for Afib; current INR at goal with last Coumadin dose taken 10/21.  Goal of Therapy:  INR 2-3   Plan:  Coumadin 2.5mg  x1 01/30/18 then will continue home Coumadin regimen of 2.5mg  MWFSS and 5mg  TT upon discharge. Monitor INR.  Brittany [name], Rph Please check Amion for pharmacy contact number

## 2018-01-30 NOTE — Care Management Obs Status (Signed)
[facility] NOTIFICATION   Patient Details  Name: Brittany [name] MRN: [id number] Date of Birth: 03-27-1928   Medicare Observation Status Notification Given:  Yes    Bethena Roys, RN 01/30/2018, 4:49 PM

## 2018-01-30 NOTE — Progress Notes (Signed)
ANTICOAGULATION CONSULT NOTE - Initial Consult  Pharmacy Consult for Coumadin Indication: atrial fibrillation  Allergies  Allergen Reactions  . Amlodipine Swelling and Other (See Comments)     Unspecified swelling reaction  . Amoxicillin Diarrhea, Nausea And Vomiting and Other (See Comments)    Has patient had a PCN reaction causing immediate rash, facial/tongue/throat swelling, SOB or lightheadedness with hypotension: No Has patient had a PCN reaction causing severe rash involving mucus membranes or skin necrosis: No Has patient had a PCN reaction that required hospitalization: No Has patient had a PCN reaction occurring within the last 10 years: No If all of the above answers are "NO", then may proceed with Cephalosporin use.  . Carafate [Sucralfate] Swelling and Other (See Comments)    Reaction:  Knee swelling/redness   . Cephalexin Diarrhea  . Codeine Nausea And Vomiting  . Irbesartan Swelling and Other (See Comments)    Reaction:  Facial/hand swelling and numbness   . Morphine And Related Other (See Comments)    Pt states that she has a history of addiction with this medication.      . Neurontin [Gabapentin] Swelling and Other (See Comments)    Reaction:  Leg swelling   . Nitrofurantoin Monohyd Macro Diarrhea and Nausea And Vomiting  . Prednisone Other (See Comments)    Reaction:  Elevated BP  . Sulfa Antibiotics Rash    Patient Measurements: Height: 5\' 1"  (154.9 cm) Weight: 120 lb 1.6 oz (54.5 kg) IBW/kg (Calculated) : 47.8  Vital Signs: Temp: 97.7 F (36.5 C) (10/22 0222) Temp Source: Axillary (10/22 0222) BP: 177/71 (10/22 0222) Pulse Rate: 60 (10/22 0222)  Labs: Recent Labs    01/29/18 1545 01/30/18 0105  HGB 13.8  --   HCT 41.8  --   PLT 196  --   LABPROT  --  23.1*  INR  --  2.08  CREATININE 1.11*  --     Estimated Creatinine Clearance: 26.4 mL/min (A) (by C-G formula based on SCr of 1.11 mg/dL (H)).   Medical History: Past Medical History:   Diagnosis Date  . Abnormal liver diagnostic imaging    suspected cirrhosis  . Arthritis   . Atrial fibrillation ([place])   . Chronic back pain   . Diverticulosis   . Dyslipidemia    takes Lipitor daily  . Dysrhythmia    Atrial Fibrillation  . Foot drop    SINCE 1987  . GERD (gastroesophageal reflux disease)   . H/O hiatal hernia   . Hearing impaired    Bilateral hearing aids  . History of bacterial endocarditis   . History of gastritis   . History of stomach ulcers    many yrs ago  . History of TIA (transient ischemic attack)    2013-- RESIDUAL PERIPHERAL VISION RIGHT EYE--  RESOLVED  . Hypertension   . IC (interstitial cystitis)   . Pulmonary hypertension ([place]) 10/2013   Based on TTE  . S/P aortic valve replacement    2005  . Urine incontinence     Medications:  Medications Prior to Admission  Medication Sig Dispense Refill Last Dose  . atorvastatin (LIPITOR) 20 MG tablet Take 20 mg by mouth daily.   01/29/2018 at Unknown time  . busPIRone (BUSPAR) 15 MG tablet TAKE ONE TABLET BY MOUTH TWICE A DAY (Patient taking differently: Take 15 mg by mouth 2 (two) times daily. ) 180 tablet 0 01/29/2018 at Unknown time  . cycloSPORINE (RESTASIS) 0.05 % ophthalmic emulsion Place 1 drop into both  eyes 2 (two) times daily.   01/29/2018 at Unknown time  . diazepam (VALIUM) 5 MG tablet Take 5 mg by mouth at bedtime as needed (sleep).    Past Week at Unknown time  . diltiazem (CARDIZEM) 120 MG tablet Take 120 mg by mouth 2 (two) times daily.   01/29/2018 at Unknown time  . HYDROcodone-acetaminophen (NORCO/VICODIN) 5-325 MG per tablet Take 1 tablet by mouth every 6 (six) hours as needed (pain).    Past Week at Unknown time  . hyoscyamine (LEVSIN, ANASPAZ) 0.125 MG tablet Take 0.125 mg by mouth every 4 (four) hours as needed for cramping.    Past Week at Unknown time  . ipratropium (ATROVENT) 0.06 % nasal spray Place 2 sprays into both nostrils 2 (two) times daily.    01/29/2018 at Unknown time   . lisinopril (PRINIVIL,ZESTRIL) 40 MG tablet Take 40 mg by mouth daily.    01/29/2018 at Unknown time  . Multiple Vitamins-Minerals (ICAPS AREDS 2) CAPS Take 1 capsule by mouth 2 (two) times daily.   01/29/2018 at Unknown time  . ondansetron (ZOFRAN) 4 MG tablet Take 1 tablet (4 mg total) by mouth every 8 (eight) hours as needed for nausea. 60 tablet 3 Past Month at Unknown time  . oxybutynin (DITROPAN) 5 MG tablet Take 5-10 mg by mouth See admin instructions. Take 1 tablet (5 mg) by mouth every morning and 2 tablets (10 mg) at night   01/29/2018 at Unknown time  . pantoprazole (PROTONIX) 40 MG tablet Take 1 tablet (40 mg total) by mouth 2 (two) times daily. 180 tablet 1 01/29/2018 at Unknown time  . Polyvinyl Alcohol-Povidone (REFRESH OP) Place 1 drop into both eyes 2 (two) times daily.   01/29/2018 at Unknown time  . potassium chloride SA (K-DUR,KLOR-CON) 20 MEQ tablet Take 20 mEq by mouth daily as needed (ONLY WHEN TAKING TORSEMIDE).   Past Month at Unknown time  . promethazine (PHENERGAN) 12.5 MG tablet Take 1 tablet (12.5 mg total) by mouth every 6 (six) hours as needed for nausea or vomiting. 30 tablet 0 Past Month at Unknown time  . ranitidine (ZANTAC) 150 MG tablet Take 150 mg by mouth at bedtime.    01/29/2018 at Unknown time  . torsemide (DEMADEX) 20 MG tablet Take 20 mg by mouth daily.    01/29/2018 at Unknown time  . warfarin (COUMADIN) 5 MG tablet TAKE AS DIRECTED BY COUMADIN CLINIC (Patient taking differently: Take 2.5-5 mg by mouth See admin instructions. Take 1 tablet on Tuesday and Thursday then take 1/2 tablet all the other days) 30 tablet 3 01/29/2018 at 1900   Scheduled:  . aspirin  324 mg Oral Once  . [START ON 01/31/2018] aspirin EC  81 mg Oral Daily  . atorvastatin  20 mg Oral q1800  . busPIRone  15 mg Oral BID  . cycloSPORINE  1 drop Both Eyes BID  . diltiazem  120 mg Oral BID  . famotidine  20 mg Oral QHS  . lisinopril  40 mg Oral Daily  . multivitamin  2 tablet Oral  BID  . oxybutynin  10 mg Oral QHS  . oxybutynin  5 mg Oral Daily  . pantoprazole  40 mg Oral BID  . polyvinyl alcohol  1 drop Both Eyes BID    Assessment: 84yo female c/o CP and dizziness x1wk, initial w/u in ED unremarkable, admitted for further evaluation, to continue Coumadin for Afib; current INR at goal with last Coumadin dose taken 10/21.  Goal of  Therapy:  INR 2-3   Plan:  Will continue home Coumadin regimen of 2.5mg  MWFSS and 5mg  TT and monitor INR.  Wynona Neat, PharmD, BCPS  01/30/2018,4:02 AM

## 2018-01-30 NOTE — Progress Notes (Signed)
[facility] PROGRESS NOTE  LALANI WINKLES EYC:[id number] DOB: 06-21-1927 DOA: 01/29/2018 PCP: Mayra Neer, MD  Assessment/Plan:  . Chest pain - Presented 10/21 with 1 wk of intermittent chest pain, worse yesterday  - She had CAD on remote cath, normal stress test in 2013. Hx NSTEMI with DES to RCA  - Initial workup in ED is reassuring and she was treated with ASA 324 mg  - evaluated by cardiology who opine symptoms typical and atypical and more consistent with hiatal hernia. Recommended gi cocktail and no further cardiac testing at this time -continue gi cocktail -will likely need OP follow up  2. Chronic atrial fibrillation  - Rate is controlled  - CHADS-VASc 5 (age x2, gender, HTN, CAD)  - Continue warfarin, diltiazem   3. Hyponatremia  - Serum sodium is 127 on admission, previously in low 130's  - Appears euvolemic  - Urine osmolality 228 and urine sodium 45. -gentle IV fluids -recheck in am    4. CKD stage III  - SCr is 1.11 on admission, similar to priors  - Renally-dose medications    5. Hypertension  -controlled - Continue diltiazem and lisinopril as tolerated   Code Status: dnr Family Communication: none present Disposition Plan: home tomorrow   Consultants:  [facility]  Procedures:  none  Antibiotics:  none  HPI/Subjective: Complains of continued discomfort in epigastric area. Denies nausea/sob/dizziness  Objective: Vitals:   01/30/18 0515 01/30/18 1004  BP: 140/69 132/86  Pulse:    Resp: 16   Temp:    SpO2:      Intake/Output Summary (Last 24 hours) at 01/30/2018 1337 Last data filed at 01/30/2018 1045 Gross per 24 hour  Intake 420 ml  Output 200 ml  Net 220 ml   Filed Weights   01/29/18 1532 01/30/18 0222  Weight: 55.3 kg 54.5 kg    Exam:   General:  Awake alert in no acute distress but appears unhappy  Cardiovascular: irregularly irregular no MGR no LE edema  Respiratory: normal effort BS clear  bilaterally no wheeze  Abdomen: flat soft +BS mild tenderness to palpation no guarding or rebounding  Musculoskeletal: joints without swelling/erythema    Data Reviewed: Basic Metabolic Panel: Recent Labs  Lab 01/29/18 1545 01/30/18 0814  NA 127* 127*  K 3.9 3.8  CL 89* 95*  CO2 25 24  GLUCOSE 104* 94  BUN 21 11  CREATININE 1.11* 0.74  CALCIUM 9.2 8.7*   Liver Function Tests: No results for input(s): AST, ALT, ALKPHOS, BILITOT, PROT, ALBUMIN in the last 168 hours. No results for input(s): LIPASE, AMYLASE in the last 168 hours. No results for input(s): AMMONIA in the last 168 hours. CBC: Recent Labs  Lab 01/29/18 1545  WBC 8.1  HGB 13.8  HCT 41.8  MCV 87.6  PLT 196   Cardiac Enzymes: Recent Labs  Lab 01/30/18 0306 01/30/18 0814  TROPONINI <0.03 <0.03   BNP (last 3 results) No results for input(s): BNP in the last 8760 hours.  ProBNP (last 3 results) No results for input(s): PROBNP in the last 8760 hours.  CBG: No results for input(s): GLUCAP in the last 168 hours.  No results found for this or any previous visit (from the past 240 hour(s)).   Studies: Dg Chest 2 View  Result Date: 01/29/2018 CLINICAL DATA:  Chest pain EXAM: CHEST - 2 VIEW COMPARISON:  08/25/2017 FINDINGS: Post sternotomy changes with valve prosthesis. Dense mitral calcification. Mild cardiomegaly. No pleural effusion or focal opacity. Aortic atherosclerosis.  No pneumothorax. IMPRESSION: No active cardiopulmonary disease.  Mild cardiomegaly. Electronically Signed   By: Donavan Foil M.D.   On: 01/29/2018 16:55    Scheduled Meds: . aspirin  324 mg Oral Once  . [START ON 01/31/2018] aspirin EC  81 mg Oral Daily  . atorvastatin  20 mg Oral q1800  . busPIRone  15 mg Oral BID  . cycloSPORINE  1 drop Both Eyes BID  . diltiazem  120 mg Oral BID  . famotidine  20 mg Oral QHS  . lisinopril  40 mg Oral Daily  . multivitamin  2 tablet Oral BID  . oxybutynin  10 mg Oral QHS  . oxybutynin  5 mg  Oral Daily  . pantoprazole  40 mg Oral BID  . polyvinyl alcohol  1 drop Both Eyes BID  . Warfarin - Pharmacist Dosing Inpatient   Does not apply q1800   Continuous Infusions:  Principal Problem:   Chest pain Active Problems:   Hypertension   Chronic atrial fibrillation (HCC)   S/P AVR (aortic valve replacement)   Chronic diastolic heart failure (HCC)   Hyponatremia   CKD (chronic kidney disease), stage III ([place])    Time spent: 45 minutes    [facility] If 7PM-7AM, please contact night-coverage at [email or web address], password [facility] 01/30/2018, 1:37 PM  LOS: 0 days

## 2018-01-30 NOTE — H&P (Signed)
History and Physical    Brittany [name] CHE:[id number] DOB: 12-25-1927 DOA: 01/29/2018  PCP: Mayra Neer, MD   Patient coming from: Home   Chief Complaint: Chest pain   HPI: Brittany [name] is a 84 y.o. female with medical history significant for atrial fibrillation on warfarin, aortic valve repair, CAD on remote cath, CKD 3, hypertension, and anxiety, now presenting to the emergency department for evaluation of chest pain.  Patient reports that she has been experiencing intermittent pain in the central chest for the past week, worse yesterday while out shopping.  Pain is described as pressure, sometimes radiating towards the left arm, intermittent.  She denies any significant shortness of breath and denies diaphoresis, but reports some nausea associated with this.  Pain was worse yesterday when she was out shopping, but otherwise she cannot identify any alleviating or exacerbating factors.  She denies any fevers or chills denies lower extremity swelling or tenderness.  ED Course: Upon arrival to the ED, patient is found to be afebrile, saturating well on room air, hypertensive, and with vitals otherwise normal.  EKG features atrial fibrillation with incomplete RBBB.  Chest x-ray is notable for mild cardiomegaly, but no acute findings.  Chemistry panel features a sodium of 127 and creatinine 1.11, similar to priors.  CBC is unremarkable and troponin is undetectable.  Patient was given 324 mg of aspirin in the ED.  Pain eased off prior to admission.  She will be observed for ongoing evaluation and management.  Review of Systems:  All other systems reviewed and apart from HPI, are negative.  Past Medical History:  Diagnosis Date  . Abnormal liver diagnostic imaging    suspected cirrhosis  . Arthritis   . Atrial fibrillation ([place])   . Chronic back pain   . Diverticulosis   . Dyslipidemia    takes Lipitor daily  . Dysrhythmia    Atrial Fibrillation  . Foot drop    SINCE 1987  . GERD  (gastroesophageal reflux disease)   . H/O hiatal hernia   . Hearing impaired    Bilateral hearing aids  . History of bacterial endocarditis   . History of gastritis   . History of stomach ulcers    many yrs ago  . History of TIA (transient ischemic attack)    2013-- RESIDUAL PERIPHERAL VISION RIGHT EYE--  RESOLVED  . Hypertension   . IC (interstitial cystitis)   . Pulmonary hypertension ([place]) 10/2013   Based on TTE  . S/P aortic valve replacement    2005  . Urine incontinence     Past Surgical History:  Procedure Laterality Date  . ABDOMINAL HYSTERECTOMY  1967  . AORTIC VALVE REPLACEMENT  09-29-2003    [facility] pericardial tissue valve  . APPENDECTOMY  1933  . CARDIAC CATHETERIZATION  07-15-2003 DR HELEN PRESTON   MODERATE TO MODERATELY SEVERE CALCIFIC AORTIC STENOSIS/  NORMAL CORONARY ARTERIES  . CARDIOVASCULAR STRESS TEST  01-30-2012  DR NASHER   NORMAL NUCLEAR STUDY/  EF 73%/  NORMAL LVF  . CARPAL TUNNEL RELEASE Bilateral   . CATARACT EXTRACTION W/ INTRAOCULAR LENS  IMPLANT, BILATERAL    . CHOLECYSTECTOMY  1993  . CYSTO WITH HYDRODISTENSION N/A 02/05/2013   Procedure: CYSTOSCOPY/HYDRODISTENSION;  Surgeon: Irine Seal, MD;  Location: [facility];  Service: Urology;  Laterality: N/A;  . CYSTO/ HYDRODISTENTION/ INSTILLATION CLORPACTIN  MULTIPLE  last one 2009  . DILATION AND CURETTAGE OF UTERUS    . ERCP N/A 08/10/2012   Procedure:  ENDOSCOPIC RETROGRADE CHOLANGIOPANCREATOGRAPHY (ERCP);  Surgeon: Inda Castle, MD;  Location: [place];  Service: Gastroenterology;  Laterality: N/A;  . ESOPHAGOGASTRODUODENOSCOPY (EGD) WITH PROPOFOL N/A 08/18/2014   Procedure: ESOPHAGOGASTRODUODENOSCOPY (EGD) WITH PROPOFOL;  Surgeon: Inda Castle, MD;  Location: [facility];  Service: Endoscopy;  Laterality: N/A;  . LEFT HEART CATHETERIZATION WITH CORONARY ANGIOGRAM N/A 10/18/2013   Procedure: LEFT HEART CATHETERIZATION WITH CORONARY ANGIOGRAM;  Surgeon: Jettie Booze, MD;  Location: [facility] CATH LAB;  Service: Cardiovascular;  Laterality: N/A;  . LUMBAR Catarina &  2007  . MACROPLASTIQUE URETHRAL IMPLANTATION  08-27-2009  . MINI-OPEN RIGHT ROTATOR CUFF REPAIR  07-12-2001  . RIGHT SHOULDER ARTHROSCOPY W/ DEBRIDEMENT ROTATOR CUFF AND LABRAL TEAR/ ACROMINOPLASTY/ DISTAL CLAVICLE EXCISION/ CA LIGAMENT RELEASE  10-08-1999  . TONSILLECTOMY AND ADENOIDECTOMY    . TRANSTHORACIC ECHOCARDIOGRAM  08-10-2011   MILD LVH/  EF 55-60%/  NORMAL AVR TISSUE/ MILD MR/  MODERATE DILATED LA/  MILD DILATED RA  . TRANSVAGINAL TAPE PROCEDURE  07-30-2002  . VEIN LIGATION  1969   RIGHT LOWER LEG     reports that she quit smoking about 27 years ago. Her smoking use included cigarettes. She started smoking about 68 years ago. She has a 20.00 pack-year smoking history. She has never used smokeless tobacco. She reports that she does not drink alcohol or use drugs.  Allergies  Allergen Reactions  . Amlodipine Swelling and Other (See Comments)     Unspecified swelling reaction  . Amoxicillin Diarrhea, Nausea And Vomiting and Other (See Comments)    Has patient had a PCN reaction causing immediate rash, facial/tongue/throat swelling, SOB or lightheadedness with hypotension: No Has patient had a PCN reaction causing severe rash involving mucus membranes or skin necrosis: No Has patient had a PCN reaction that required hospitalization: No Has patient had a PCN reaction occurring within the last 10 years: No If all of the above answers are "NO", then may proceed with Cephalosporin use.  . Carafate [Sucralfate] Swelling and Other (See Comments)    Reaction:  Knee swelling/redness   . Cephalexin Diarrhea  . Codeine Nausea And Vomiting  . Irbesartan Swelling and Other (See Comments)    Reaction:  Facial/hand swelling and numbness   . Morphine And Related Other (See Comments)    Pt states that she has a history of addiction with this medication.      . Neurontin  [Gabapentin] Swelling and Other (See Comments)    Reaction:  Leg swelling   . Nitrofurantoin Monohyd Macro Diarrhea and Nausea And Vomiting  . Prednisone Other (See Comments)    Reaction:  Elevated BP  . Sulfa Antibiotics Rash    Family History  Problem Relation Age of Onset  . Heart disease Father   . Stomach cancer Maternal Grandmother   . Esophageal cancer Maternal Grandfather   . Bipolar disorder Son        Committed Suicide  . Coronary artery disease Brother   . Bipolar disorder Brother        commited suiside at 20yo  . Alcohol abuse Sister        sister #1  . Alcohol abuse Sister        sister #2  . Colon cancer Maternal Aunt   . Lung disease Neg Hx      Prior to Admission medications   Medication Sig Start Date End Date Taking? Authorizing Provider  atorvastatin (LIPITOR) 20 MG tablet Take 20 mg by mouth daily.  Yes [provider]  busPIRone (BUSPAR) 15 MG tablet TAKE ONE TABLET BY MOUTH TWICE A DAY Patient taking differently: Take 15 mg by mouth 2 (two) times daily.  12/15/17  Yes Armbruster, Carlota Raspberry, MD  cycloSPORINE (RESTASIS) 0.05 % ophthalmic emulsion Place 1 drop into both eyes 2 (two) times daily.   Yes [provider]  diazepam (VALIUM) 5 MG tablet Take 5 mg by mouth at bedtime as needed (sleep).    Yes [provider]  diltiazem (CARDIZEM) 120 MG tablet Take 120 mg by mouth 2 (two) times daily.   Yes [provider]  HYDROcodone-acetaminophen (NORCO/VICODIN) 5-325 MG per tablet Take 1 tablet by mouth every 6 (six) hours as needed (pain).    Yes [provider]  hyoscyamine (LEVSIN, ANASPAZ) 0.125 MG tablet Take 0.125 mg by mouth every 4 (four) hours as needed for cramping.    Yes [provider]  ipratropium (ATROVENT) 0.06 % nasal spray Place 2 sprays into both nostrils 2 (two) times daily.    Yes [provider]  lisinopril (PRINIVIL,ZESTRIL) 40 MG tablet Take 40 mg by mouth daily.    Yes  [provider]  Multiple Vitamins-Minerals (ICAPS AREDS 2) CAPS Take 1 capsule by mouth 2 (two) times daily.   Yes [provider]  ondansetron (ZOFRAN) 4 MG tablet Take 1 tablet (4 mg total) by mouth every 8 (eight) hours as needed for nausea. 04/18/17  Yes Armbruster, Carlota Raspberry, MD  oxybutynin (DITROPAN) 5 MG tablet Take 5-10 mg by mouth See admin instructions. Take 1 tablet (5 mg) by mouth every morning and 2 tablets (10 mg) at night   Yes [provider]  pantoprazole (PROTONIX) 40 MG tablet Take 1 tablet (40 mg total) by mouth 2 (two) times daily. 12/04/17  Yes Armbruster, Carlota Raspberry, MD  Polyvinyl Alcohol-Povidone (REFRESH OP) Place 1 drop into both eyes 2 (two) times daily.   Yes [provider]  potassium chloride SA (K-DUR,KLOR-CON) 20 MEQ tablet Take 20 mEq by mouth daily as needed (ONLY WHEN TAKING TORSEMIDE).   Yes [provider]  promethazine (PHENERGAN) 12.5 MG tablet Take 1 tablet (12.5 mg total) by mouth every 6 (six) hours as needed for nausea or vomiting. 08/30/14  Yes Ghimire, Henreitta Leber, MD  ranitidine (ZANTAC) 150 MG tablet Take 150 mg by mouth at bedtime.    Yes [provider]  torsemide (DEMADEX) 20 MG tablet Take 20 mg by mouth daily.    Yes [provider]  warfarin (COUMADIN) 5 MG tablet TAKE AS DIRECTED BY COUMADIN CLINIC Patient taking differently: Take 2.5-5 mg by mouth See admin instructions. Take 1 tablet on Tuesday and Thursday then take 1/2 tablet all the other days 01/25/18  Yes Nahser, Wonda Cheng, MD    Physical Exam: Vitals:   01/30/18 0115 01/30/18 0130 01/30/18 0145 01/30/18 0222  BP: 111/62 129/61  (!) 177/71  Pulse: (!) 56  62 60  Resp:   20   Temp:    97.7 F (36.5 C)  TempSrc:    Axillary  SpO2: 97%  97% 98%  Weight:    54.5 kg  Height:    5\' 1"  (1.549 m)    Constitutional: NAD, calm, comfortable Eyes: PERTLA, lids and conjunctivae normal ENMT: Mucous membranes are moist. Posterior pharynx  clear of any exudate or lesions.   Neck: normal, supple, no masses, no thyromegaly Respiratory: clear to auscultation bilaterally, no wheezing, no crackles. Normal respiratory effort.  Cardiovascular: Rate ~60 and irregular. No extremity edema.   Abdomen: No distension, no tenderness, soft. Bowel sounds normal.  Musculoskeletal: no clubbing / cyanosis. No joint deformity upper and lower extremities.   Skin: no significant rashes, lesions, ulcers. Warm, dry, well-perfused. Neurologic: No facial asymmetry. Sensation intact. Moving all extremities.  Psychiatric: Alert and oriented x 3. Calm, cooperative.     Labs on Admission: I have personally reviewed following labs and imaging studies  CBC: Recent Labs  Lab 01/29/18 1545  WBC 8.1  HGB 13.8  HCT 41.8  MCV 87.6  PLT 106   Basic Metabolic Panel: Recent Labs  Lab 01/29/18 1545  NA 127*  K 3.9  CL 89*  CO2 25  GLUCOSE 104*  BUN 21  CREATININE 1.11*  CALCIUM 9.2   GFR: Estimated Creatinine Clearance: 26.4 mL/min (A) (by C-G formula based on SCr of 1.11 mg/dL (H)). Liver Function Tests: No results for input(s): AST, ALT, ALKPHOS, BILITOT, PROT, ALBUMIN in the last 168 hours. No results for input(s): LIPASE, AMYLASE in the last 168 hours. No results for input(s): AMMONIA in the last 168 hours. Coagulation Profile: Recent Labs  Lab 01/30/18 0105  INR 2.08   Cardiac Enzymes: No results for input(s): CKTOTAL, CKMB, CKMBINDEX, TROPONINI in the last 168 hours. BNP (last 3 results) No results for input(s): PROBNP in the last 8760 hours. HbA1C: No results for input(s): HGBA1C in the last 72 hours. CBG: No results for input(s): GLUCAP in the last 168 hours. Lipid Profile: No results for input(s): CHOL, HDL, LDLCALC, TRIG, CHOLHDL, LDLDIRECT in the last 72 hours. Thyroid Function Tests: No results for input(s): TSH, T4TOTAL, FREET4, T3FREE, THYROIDAB in the last 72 hours. Anemia Panel: No results for input(s):  VITAMINB12, FOLATE, FERRITIN, TIBC, IRON, RETICCTPCT in the last 72 hours. Urine analysis:    Component Value Date/Time   COLORURINE STRAW (A) 09/29/2016 1529   APPEARANCEUR CLEAR 09/29/2016 1529   LABSPEC 1.003 (L) 09/29/2016 1529   PHURINE 7.0 09/29/2016 1529   GLUCOSEU NEGATIVE 09/29/2016 1529   GLUCOSEU NEGATIVE 06/25/2014 1201   HGBUR MODERATE (A) 09/29/2016 1529   BILIRUBINUR NEGATIVE 09/29/2016 1529   KETONESUR NEGATIVE 09/29/2016 1529   PROTEINUR NEGATIVE 09/29/2016 1529   UROBILINOGEN 0.2 08/27/2014 1954   NITRITE NEGATIVE 09/29/2016 1529   LEUKOCYTESUR NEGATIVE 09/29/2016 1529   Sepsis Labs: @LABRCNTIP (procalcitonin:4,lacticidven:4) )No results found for this or any previous visit (from the past 240 hour(s)).   Radiological Exams on Admission: Dg Chest 2 View  Result Date: 01/29/2018 CLINICAL DATA:  Chest pain EXAM: CHEST - 2 VIEW COMPARISON:  08/25/2017 FINDINGS: Post sternotomy changes with valve prosthesis. Dense mitral calcification. Mild cardiomegaly. No pleural effusion or focal opacity. Aortic atherosclerosis. No pneumothorax. IMPRESSION: No active cardiopulmonary disease.  Mild cardiomegaly. Electronically Signed   By: Donavan Foil M.D.   On: 01/29/2018 16:55    EKG: Independently reviewed. Atrial fibrillation, incomplete RBBB.   Assessment/Plan   1. Chest pain - Presents with 1 wk of intermittent chest pain, worse yesterday  - She had CAD on remote cath, normal stress test in 2013  - Initial workup in ED is reassuring and she was treated with ASA 324 mg  - Continue cardiac monitoring, trend troponin, repeat EKG, continue statin and anticoagulation   2. Chronic atrial fibrillation  - Rate is controlled on admission  - CHADS-VASc 5 (age x2, gender, HTN, CAD)  - Continue warfarin, diltiazem   3. Hyponatremia  - Serum sodium is 127 on admission, previously in  low 130's  - Appears euvolemic  - Check urine sodium and urine osm, repeat chem panel in am     4. CKD stage III  - SCr is 1.11 on admission, similar to priors  - Renally-dose medications    5. Hypertension  - Continue diltiazem and lisinopril as tolerated    DVT prophylaxis: warfarin  Code Status: Full  Family Communication: Son updated at bedside Consults called: None Admission status: Observation     Vianne Bulls, MD [facility] Pager [phone]  If 7PM-7AM, please contact night-coverage [email or web address] Password [email or web address]  01/30/2018, 3:40 AM

## 2018-01-30 NOTE — ED Provider Notes (Signed)
TIME SEEN: 12:07 AM  CHIEF COMPLAINT: Chest pain  HPI: Patient is an 84 year old female with history of aortic valve replacement, previous MI with one stent who presents the emergency department with chest pain that started this afternoon while shopping.  Describes it as a central pressure that radiates down the left arm with shortness of breath, nausea and dizziness.  Feels similar to her previous MI.  Better with rest.  07/21/17 Echo:  Study Conclusions  - Left ventricle: The cavity size was normal. There was severe   concentric hypertrophy. Systolic function was normal. The   estimated ejection fraction was in the range of 60% to 65%. Wall   motion was normal; there were no regional wall motion   abnormalities. The study was not technically sufficient to allow   evaluation of LV diastolic dysfunction due to atrial   fibrillation. - Aortic valve: There is an [employer] pericardial tissue   AVR present. A pericardial bioprosthesis was present. The   leaflets are moderately thickened and calcified. There was mild   stenosis. Mean gradient (S): 14 mm Hg. - Mitral valve: Calcified annulus. There was trivial regurgitation. - Left atrium: The atrium was severely dilated. - Tricuspid valve: There was moderate regurgitation. - Pulmonic valve: There was mild regurgitation. - Pulmonary arteries: PA peak pressure: 38 mm Hg (S).  Impressions:  - The right ventricular systolic pressure was increased consistent   with mild pulmonary hypertension.  ROS: See HPI Constitutional: no fever  Eyes: no drainage  ENT: no runny nose   Cardiovascular:  no chest pain  Resp: no SOB  GI: no vomiting GU: no dysuria Integumentary: no rash  Allergy: no hives  Musculoskeletal: no leg swelling  Neurological: no slurred speech ROS otherwise negative  PAST MEDICAL HISTORY/PAST SURGICAL HISTORY:  Past Medical History:  Diagnosis Date  . Abnormal liver diagnostic imaging    suspected  cirrhosis  . Arthritis   . Atrial fibrillation ([place])   . Chronic back pain   . Diverticulosis   . Dyslipidemia    takes Lipitor daily  . Dysrhythmia    Atrial Fibrillation  . Foot drop    SINCE 1987  . GERD (gastroesophageal reflux disease)   . H/O hiatal hernia   . Hearing impaired    Bilateral hearing aids  . History of bacterial endocarditis   . History of gastritis   . History of stomach ulcers    many yrs ago  . History of TIA (transient ischemic attack)    2013-- RESIDUAL PERIPHERAL VISION RIGHT EYE--  RESOLVED  . Hypertension   . IC (interstitial cystitis)   . Pulmonary hypertension ([place]) 10/2013   Based on TTE  . S/P aortic valve replacement    2005  . Urine incontinence     MEDICATIONS:  Prior to Admission medications   Medication Sig Start Date End Date Taking? Authorizing Provider  atorvastatin (LIPITOR) 20 MG tablet Take 20 mg by mouth daily.    [provider]  busPIRone (BUSPAR) 15 MG tablet TAKE ONE TABLET BY MOUTH TWICE A DAY 12/15/17   Armbruster, Carlota Raspberry, MD  cycloSPORINE (RESTASIS) 0.05 % ophthalmic emulsion Place 1 drop into both eyes 2 (two) times daily.    [provider]  diazepam (VALIUM) 5 MG tablet Take 5 mg by mouth at bedtime as needed (sleep).     [provider]  diltiazem (CARDIZEM) 120 MG tablet Take 120 mg by mouth 2 (two) times daily.    [provider]  HYDROcodone-acetaminophen (NORCO/VICODIN) 5-325 MG per tablet Take 1 tablet by mouth every 6 (six) hours as needed (pain).     [provider]  hyoscyamine (LEVSIN, ANASPAZ) 0.125 MG tablet Take 0.125 mg by mouth every 4 (four) hours as needed for cramping.     [provider]  ipratropium (ATROVENT) 0.06 % nasal spray Place 2 sprays into both nostrils 2 (two) times daily.     [provider]  lisinopril (PRINIVIL,ZESTRIL) 40 MG tablet Take 20 mg by mouth daily.     [provider]  Multiple Vitamins-Minerals (ICAPS AREDS  2) CAPS Take 1 capsule by mouth 2 (two) times daily.    [provider]  ondansetron (ZOFRAN) 4 MG tablet Take 1 tablet (4 mg total) by mouth every 8 (eight) hours as needed for nausea. 04/18/17   Armbruster, Carlota Raspberry, MD  oxybutynin (DITROPAN) 5 MG tablet Take 5-10 mg by mouth See admin instructions. Take 1 tablet (5 mg) by mouth every morning and 2 tablets (10 mg) at night    [provider]  pantoprazole (PROTONIX) 40 MG tablet Take 1 tablet (40 mg total) by mouth 2 (two) times daily. 12/04/17   Armbruster, Carlota Raspberry, MD  Polyvinyl Alcohol-Povidone (REFRESH OP) Place 1 drop into both eyes 2 (two) times daily.    [provider]  potassium chloride SA (K-DUR,KLOR-CON) 20 MEQ tablet Take 20 mEq by mouth daily as needed (ONLY WHEN TAKING TORSEMIDE).    [provider]  promethazine (PHENERGAN) 12.5 MG tablet Take 1 tablet (12.5 mg total) by mouth every 6 (six) hours as needed for nausea or vomiting. 08/30/14   Ghimire, Henreitta Leber, MD  ranitidine (ZANTAC) 150 MG tablet Take 150 mg by mouth at bedtime.     [provider]  torsemide (DEMADEX) 20 MG tablet Take 20 mg by mouth daily.     [provider]  warfarin (COUMADIN) 5 MG tablet TAKE AS DIRECTED BY COUMADIN CLINIC 01/25/18   Nahser, Wonda Cheng, MD    ALLERGIES:  Allergies  Allergen Reactions  . Amlodipine Swelling and Other (See Comments)     Unspecified swelling reaction  . Amoxicillin Diarrhea, Nausea And Vomiting and Other (See Comments)    Has patient had a PCN reaction causing immediate rash, facial/tongue/throat swelling, SOB or lightheadedness with hypotension: No Has patient had a PCN reaction causing severe rash involving mucus membranes or skin necrosis: No Has patient had a PCN reaction that required hospitalization: No Has patient had a PCN reaction occurring within the last 10 years: No If all of the above answers are "NO", then may proceed with Cephalosporin use.  . Carafate  [Sucralfate] Swelling and Other (See Comments)    Reaction:  Knee swelling/redness   . Cephalexin Diarrhea  . Codeine Nausea And Vomiting  . Irbesartan Swelling and Other (See Comments)    Reaction:  Facial/hand swelling and numbness   . Morphine And Related Other (See Comments)    Pt states that she has a history of addiction with this medication.      . Neurontin [Gabapentin] Swelling and Other (See Comments)    Reaction:  Leg swelling   . Nitrofurantoin Monohyd Macro Diarrhea and Nausea And Vomiting  . Prednisone Other (See Comments)    Reaction:  Elevated BP  . Sulfa Antibiotics Rash    SOCIAL HISTORY:  Social History   Tobacco Use  . Smoking status: Former Smoker    Packs/day: 0.50    Years:  40.00    Pack years: 20.00    Types: Cigarettes    Start date: 05/06/1949    Last attempt to quit: 03/06/1990    Years since quitting: 27.9  . Smokeless tobacco: Never Used  Substance Use Topics  . Alcohol use: No    Alcohol/week: 0.0 standard drinks    Comment: Remote social EtOH    FAMILY HISTORY: Family History  Problem Relation Age of Onset  . Heart disease Father   . Stomach cancer Maternal Grandmother   . Esophageal cancer Maternal Grandfather   . Bipolar disorder Son        Committed Suicide  . Coronary artery disease Brother   . Bipolar disorder Brother        commited suiside at 55yo  . Alcohol abuse Sister        sister #1  . Alcohol abuse Sister        sister #2  . Colon cancer Maternal Aunt   . Lung disease Neg Hx     EXAM: BP (!) 160/66   Pulse (!) 59   Temp 97.7 F (36.5 C) (Oral)   Resp 12   Ht 5\' 1"  (1.549 m)   Wt 55.3 kg   SpO2 99%   BMI 23.05 kg/m  CONSTITUTIONAL: Alert and oriented and responds appropriately to questions. Well-appearing; well-nourished, elderly HEAD: Normocephalic EYES: Conjunctivae clear, pupils appear equal, EOMI ENT: normal nose; moist mucous membranes NECK: Supple, no meningismus, no nuchal rigidity, no LAD  CARD:  RRR; S1 and S2 appreciated; mechanical murmur appreciated, no clicks, no rubs, no gallops RESP: Normal chest excursion without splinting or tachypnea; breath sounds clear and equal bilaterally; no wheezes, no rhonchi, no rales, no hypoxia or respiratory distress, speaking full sentences ABD/GI: Normal bowel sounds; non-distended; soft, non-tender, no rebound, no guarding, no peritoneal signs, no hepatosplenomegaly BACK:  The back appears normal and is non-tender to palpation, there is no CVA tenderness EXT: Normal ROM in all joints; non-tender to palpation; no edema; normal capillary refill; no cyanosis, no calf tenderness or swelling    SKIN: Normal color for age and race; warm; no rash NEURO: Moves all extremities equally PSYCH: The patient's mood and manner are appropriate. Grooming and personal hygiene are appropriate.  MEDICAL DECISION MAKING: Patient here with complaints of chest pain.  Has history of MI and states this feels similar.  Will give aspirin, nitroglycerin.  First troponin negative.  Will obtain second troponin.  Chest x-ray clear.  Will discuss with medicine for admission for chest pain rule out.  Doubt PE or dissection.  No signs of volume overload, pneumonia.  ED PROGRESS: Second troponin negative.   1:13 AM Discussed patient's case with hospitalist, Dr. Myna Hidalgo.  I have recommended admission and patient (and family if present) agree with this plan. Admitting physician will place admission orders.   I reviewed all nursing notes, vitals, pertinent previous records, EKGs, lab and urine results, imaging (as available).      EKG Interpretation  Date/Time:  Monday January 29 2018 15:28:13 EDT Ventricular Rate:  73 PR Interval:    QRS Duration: 100 QT Interval:  418 QTC Calculation: 460 R Axis:   -100 Text Interpretation:  Atrial fibrillation Right superior axis deviation Incomplete right bundle branch block Possible Anteroseptal infarct , age undetermined Abnormal ECG No  significant change since last tracing Confirmed by Keimari , Cyril Mourning [phone]) on 01/30/2018 12:06:56 AM         Adison Jerger, Delice Bison, DO 01/30/18 9417

## 2018-01-31 ENCOUNTER — Encounter (HOSPITAL_COMMUNITY): Payer: Self-pay | Admitting: Anesthesiology

## 2018-01-31 DIAGNOSIS — K210 Gastro-esophageal reflux disease with esophagitis: Secondary | ICD-10-CM | POA: Diagnosis present

## 2018-01-31 DIAGNOSIS — Z952 Presence of prosthetic heart valve: Secondary | ICD-10-CM | POA: Diagnosis not present

## 2018-01-31 DIAGNOSIS — K222 Esophageal obstruction: Secondary | ICD-10-CM | POA: Diagnosis not present

## 2018-01-31 DIAGNOSIS — Z8719 Personal history of other diseases of the digestive system: Secondary | ICD-10-CM | POA: Diagnosis not present

## 2018-01-31 DIAGNOSIS — K209 Esophagitis, unspecified: Secondary | ICD-10-CM | POA: Diagnosis not present

## 2018-01-31 DIAGNOSIS — F419 Anxiety disorder, unspecified: Secondary | ICD-10-CM | POA: Diagnosis present

## 2018-01-31 DIAGNOSIS — E871 Hypo-osmolality and hyponatremia: Secondary | ICD-10-CM | POA: Diagnosis not present

## 2018-01-31 DIAGNOSIS — I5032 Chronic diastolic (congestive) heart failure: Secondary | ICD-10-CM | POA: Diagnosis present

## 2018-01-31 DIAGNOSIS — Z974 Presence of external hearing-aid: Secondary | ICD-10-CM | POA: Diagnosis not present

## 2018-01-31 DIAGNOSIS — I8500 Esophageal varices without bleeding: Secondary | ICD-10-CM | POA: Diagnosis present

## 2018-01-31 DIAGNOSIS — B3781 Candidal esophagitis: Secondary | ICD-10-CM | POA: Diagnosis not present

## 2018-01-31 DIAGNOSIS — I4510 Unspecified right bundle-branch block: Secondary | ICD-10-CM | POA: Diagnosis present

## 2018-01-31 DIAGNOSIS — I2510 Atherosclerotic heart disease of native coronary artery without angina pectoris: Secondary | ICD-10-CM | POA: Diagnosis present

## 2018-01-31 DIAGNOSIS — K259 Gastric ulcer, unspecified as acute or chronic, without hemorrhage or perforation: Secondary | ICD-10-CM | POA: Diagnosis present

## 2018-01-31 DIAGNOSIS — T18128A Food in esophagus causing other injury, initial encounter: Secondary | ICD-10-CM | POA: Diagnosis present

## 2018-01-31 DIAGNOSIS — E785 Hyperlipidemia, unspecified: Secondary | ICD-10-CM | POA: Diagnosis present

## 2018-01-31 DIAGNOSIS — I4821 Permanent atrial fibrillation: Secondary | ICD-10-CM | POA: Diagnosis present

## 2018-01-31 DIAGNOSIS — K228 Other specified diseases of esophagus: Secondary | ICD-10-CM | POA: Diagnosis not present

## 2018-01-31 DIAGNOSIS — N183 Chronic kidney disease, stage 3 (moderate): Secondary | ICD-10-CM | POA: Diagnosis present

## 2018-01-31 DIAGNOSIS — Z66 Do not resuscitate: Secondary | ICD-10-CM | POA: Diagnosis present

## 2018-01-31 DIAGNOSIS — E222 Syndrome of inappropriate secretion of antidiuretic hormone: Secondary | ICD-10-CM | POA: Diagnosis present

## 2018-01-31 DIAGNOSIS — I482 Chronic atrial fibrillation, unspecified: Secondary | ICD-10-CM | POA: Diagnosis not present

## 2018-01-31 DIAGNOSIS — Z8673 Personal history of transient ischemic attack (TIA), and cerebral infarction without residual deficits: Secondary | ICD-10-CM | POA: Diagnosis not present

## 2018-01-31 DIAGNOSIS — I130 Hypertensive heart and chronic kidney disease with heart failure and stage 1 through stage 4 chronic kidney disease, or unspecified chronic kidney disease: Secondary | ICD-10-CM | POA: Diagnosis present

## 2018-01-31 DIAGNOSIS — Z882 Allergy status to sulfonamides status: Secondary | ICD-10-CM | POA: Diagnosis not present

## 2018-01-31 DIAGNOSIS — Z79899 Other long term (current) drug therapy: Secondary | ICD-10-CM | POA: Diagnosis not present

## 2018-01-31 DIAGNOSIS — Z7901 Long term (current) use of anticoagulants: Secondary | ICD-10-CM

## 2018-01-31 DIAGNOSIS — H9190 Unspecified hearing loss, unspecified ear: Secondary | ICD-10-CM | POA: Diagnosis present

## 2018-01-31 DIAGNOSIS — K2950 Unspecified chronic gastritis without bleeding: Secondary | ICD-10-CM | POA: Diagnosis not present

## 2018-01-31 DIAGNOSIS — R1013 Epigastric pain: Secondary | ICD-10-CM | POA: Diagnosis not present

## 2018-01-31 DIAGNOSIS — Z87891 Personal history of nicotine dependence: Secondary | ICD-10-CM | POA: Diagnosis not present

## 2018-01-31 DIAGNOSIS — R0789 Other chest pain: Secondary | ICD-10-CM | POA: Diagnosis not present

## 2018-01-31 DIAGNOSIS — Z888 Allergy status to other drugs, medicaments and biological substances status: Secondary | ICD-10-CM | POA: Diagnosis not present

## 2018-01-31 DIAGNOSIS — Z885 Allergy status to narcotic agent status: Secondary | ICD-10-CM | POA: Diagnosis not present

## 2018-01-31 DIAGNOSIS — Z881 Allergy status to other antibiotic agents status: Secondary | ICD-10-CM | POA: Diagnosis not present

## 2018-01-31 LAB — PROTIME-INR
INR: 2.27
Prothrombin Time: 24.7 s — ABNORMAL HIGH (ref 11.4–15.2)

## 2018-01-31 LAB — BASIC METABOLIC PANEL
Anion gap: 7 (ref 5–15)
BUN: 7 mg/dL — ABNORMAL LOW (ref 8–23)
CALCIUM: 8.7 mg/dL — AB (ref 8.9–10.3)
CO2: 27 mmol/L (ref 22–32)
CREATININE: 0.74 mg/dL (ref 0.44–1.00)
Chloride: 92 mmol/L — ABNORMAL LOW (ref 98–111)
GFR calc Af Amer: >60 mL/min (ref >60)
GLUCOSE: 91 mg/dL (ref 70–99)
POTASSIUM: 4.3 mmol/L (ref 3.5–5.1)
SODIUM: 126 mmol/L — AB (ref 135–145)

## 2018-01-31 MED ORDER — WARFARIN SODIUM 2.5 MG PO TABS
2.5000 mg | ORAL_TABLET | Freq: Once | ORAL | Status: AC
  Administered 2018-01-31: 2.5 mg via ORAL
  Filled 2018-01-31: qty 1

## 2018-01-31 MED ORDER — BISMUTH SUBSALICYLATE 262 MG/15ML PO SUSP
30.0000 mL | Freq: Three times a day (TID) | ORAL | Status: DC
  Administered 2018-01-31: 30 mL via ORAL
  Filled 2018-01-31: qty 236

## 2018-01-31 MED ORDER — PROMETHAZINE HCL 25 MG/ML IJ SOLN
6.2500 mg | Freq: Four times a day (QID) | INTRAMUSCULAR | Status: DC | PRN
  Administered 2018-01-31: 6.25 mg via INTRAVENOUS
  Filled 2018-01-31: qty 1

## 2018-01-31 MED ORDER — DOCUSATE SODIUM 100 MG PO CAPS
100.0000 mg | ORAL_CAPSULE | Freq: Two times a day (BID) | ORAL | Status: DC
  Administered 2018-01-31 – 2018-02-02 (×5): 100 mg via ORAL
  Filled 2018-01-31 (×5): qty 1

## 2018-01-31 NOTE — Telephone Encounter (Signed)
Noted. Will follow-up with patient after discharge

## 2018-01-31 NOTE — Discharge Instructions (Signed)

## 2018-01-31 NOTE — H&P (View-Only) (Signed)
[facility] Consult: 2:07 PM 01/31/2018  LOS: 0 days    Referring Provider: Dr. Eliseo Squires Primary Care Physician:  Mayra Neer, MD Primary Gastroenterologist:  Dr. Havery Moros     Reason for Consultation:  Epigastric pain.   HPI: Brittany [name] is a 84 y.o. female.  Past medical history include aortic valve replacement, diastolic heart failure.  Hypertension.  Atrial fibrillation.  TIA.  History MI, CAD.  Chronic Coumadin.  Interstitial cystitis.  CKD 3.  2006 colonoscopy, diverticulosis, hemorrhoids, hyperplastic polyp. 06/2014 CT abdomen pelvis showed atherosclerosis of the SMA and celiac, stable since 2013. 07/2014 gastric emptying study.  Normal. 08/2014 EGD with gastric erythema.  Biopsy showed reactive changes with gastrointestinal metaplasia.  H. pylori was negative. 06/2015 barium esophagram:   Nonspecific motility disorder, 13 mm tablet passed readily through the esophagus 06/2016 abdominal ultrasound showed prior cholecystectomy.  Inhomogeneous liver with irregular contours suggesting early cirrhosis.  CBD 7.2 mm.  Portal vein patent with normal directional blood flow.  Elastography showed shear wave velocity calculated at 1.54 m/sec.  Metavir fibrosis score is  F2 + some F3, corresponding with moderate risk of fibrosis. Hepatitis B and C serologies negative.  Iron studies negative.  Alpha-1 antitrypsin negative.  No significant history of alcohol abuse.  A couple of years ago she was evaluated by Dr. Havery Moros for chronic nausea and dyspepsia despite using daily Zantac and Protonix.  Which he attributed to multiple meds, especially chronic narcotics.  He recommended minimizing narcotics and stopping her use of Ativan.   She treats her nausea, which is present when she wakes up in the morning, with  Phenergan first thing in the day.  Zofran does not help very much.  She complains of postprandial nausea and discomfort, abdominal fullness even with small meals. Although it was suggested that she try FD guard, she did not.  She was started on BuSpar 15 mg TID in January 2018.  In June 2018 she was hospitalized with abdominal pain.  CT showed inflammation in the ascending, transverse, descending colon.  CT angio then pursued to rule out ischemia but this showed no vascular compromise.  On CT angio the colonic inflammation had resolved.  Patient vigorously declined repeat colonoscopy offered by Dr. Havery Moros in December 2018.  Last office visit with Dr. Havery Moros was in July 2019 for follow-up of her dyspepsia.  The BuSpar was helping.  She was eating 3 meals a day without vomiting.  Weight was stable.  Protonix once daily was controlling her reflux symptoms.  Patient continued to do well this summer and into the fall.  In late September, early October she was treated for what Dr. Sandrea Matte felt was a UTI with doxycycline and Pyridium.  Her symptoms did not improve and on 10/7, 10/8, 10/9 she was treated with injections of gentamicin.  It was after this that she started to feel weak, nauseated and lost her appetite.  She also complained of dizziness.  This Monday, 10/21 she developed severe epigastric distress and pressure.  She is now admitted for her symptoms.  GI  cocktail helped for several hours.  Vicodin does not really help.  Bismuth does not help.  Labs show hyponatremia at 126.  Normal renal function.  LFTs have not been obtained.  No elevated WBC count.  INR 2.2. CXR shows no heart failure or pneumonia.    Past Medical History:  Diagnosis Date  . Abnormal liver diagnostic imaging    suspected cirrhosis  . Arthritis   . Atrial fibrillation ([place])   . Chronic back pain   . Diverticulosis   . Dyslipidemia    takes Lipitor daily  . Dysrhythmia    Atrial Fibrillation  . Foot drop    SINCE  1987  . GERD (gastroesophageal reflux disease)   . H/O hiatal hernia   . Hearing impaired    Bilateral hearing aids  . History of bacterial endocarditis   . History of gastritis   . History of stomach ulcers    many yrs ago  . History of TIA (transient ischemic attack)    2013-- RESIDUAL PERIPHERAL VISION RIGHT EYE--  RESOLVED  . Hypertension   . IC (interstitial cystitis)   . Pulmonary hypertension ([place]) 10/2013   Based on TTE  . S/P aortic valve replacement    2005  . Urine incontinence     Past Surgical History:  Procedure Laterality Date  . ABDOMINAL HYSTERECTOMY  1967  . AORTIC VALVE REPLACEMENT  09-29-2003    [facility] pericardial tissue valve  . APPENDECTOMY  1933  . CARDIAC CATHETERIZATION  07-15-2003 DR HELEN PRESTON   MODERATE TO MODERATELY SEVERE CALCIFIC AORTIC STENOSIS/  NORMAL CORONARY ARTERIES  . CARDIOVASCULAR STRESS TEST  01-30-2012  DR NASHER   NORMAL NUCLEAR STUDY/  EF 73%/  NORMAL LVF  . CARPAL TUNNEL RELEASE Bilateral   . CATARACT EXTRACTION W/ INTRAOCULAR LENS  IMPLANT, BILATERAL    . CHOLECYSTECTOMY  1993  . CYSTO WITH HYDRODISTENSION N/A 02/05/2013   Procedure: CYSTOSCOPY/HYDRODISTENSION;  Surgeon: Irine Seal, MD;  Location: [facility];  Service: Urology;  Laterality: N/A;  . CYSTO/ HYDRODISTENTION/ INSTILLATION CLORPACTIN  MULTIPLE  last one 2009  . DILATION AND CURETTAGE OF UTERUS    . ERCP N/A 08/10/2012   Procedure: ENDOSCOPIC RETROGRADE CHOLANGIOPANCREATOGRAPHY (ERCP);  Surgeon: Inda Castle, MD;  Location: [facility];  Service: Gastroenterology;  Laterality: N/A;  . ESOPHAGOGASTRODUODENOSCOPY (EGD) WITH PROPOFOL N/A 08/18/2014   Procedure: ESOPHAGOGASTRODUODENOSCOPY (EGD) WITH PROPOFOL;  Surgeon: Inda Castle, MD;  Location: [facility];  Service: Endoscopy;  Laterality: N/A;  . LEFT HEART CATHETERIZATION WITH CORONARY ANGIOGRAM N/A 10/18/2013   Procedure: LEFT HEART CATHETERIZATION WITH CORONARY ANGIOGRAM;  Surgeon:  Jettie Booze, MD;  Location: [facility] C[facility];  Service: Cardiovascular;  Laterality: N/A;  . LUMBAR [place] &  2007  . MACROPLASTIQUE URETHRAL IMPLANTATION  08-27-2009  . MINI-OPEN RIGHT ROTATOR CUFF REPAIR  07-12-2001  . RIGHT SHOULDER ARTHROSCOPY W/ DEBRIDEMENT ROTATOR CUFF AND LABRAL TEAR/ ACROMINOPLASTY/ DISTAL CLAVICLE EXCISION/ CA LIGAMENT RELEASE  10-08-1999  . TONSILLECTOMY AND ADENOIDECTOMY    . TRANSTHORACIC ECHOCARDIOGRAM  08-10-2011   MILD LVH/  EF 55-60%/  NORMAL AVR TISSUE/ MILD MR/  MODERATE DILATED LA/  MILD DILATED RA  . TRANSVAGINAL TAPE PROCEDURE  07-30-2002  . VEIN LIGATION  1969   RIGHT LOWER LEG    Prior to Admission medications   Medication Sig Start Date End Date Taking? Authorizing Provider  atorvastatin (LIPITOR) 20 MG tablet Take 20 mg by mouth daily.   Yes [provider]  busPIRone (BUSPAR) 15 MG tablet TAKE ONE TABLET BY MOUTH TWICE A DAY Patient taking differently: Take 15 mg by mouth 2 (two) times daily.  12/15/17  Yes Armbruster, Carlota Raspberry, MD  cycloSPORINE (RESTASIS) 0.05 % ophthalmic emulsion Place 1 drop into both eyes 2 (two) times daily.   Yes [provider]  diazepam (VALIUM) 5 MG tablet Take 5 mg by mouth at bedtime as needed (sleep).    Yes [provider]  diltiazem (CARDIZEM) 120 MG tablet Take 120 mg by mouth 2 (two) times daily.   Yes [provider]  HYDROcodone-acetaminophen (NORCO/VICODIN) 5-325 MG per tablet Take 1 tablet by mouth every 6 (six) hours as needed (pain).    Yes [provider]  hyoscyamine (LEVSIN, ANASPAZ) 0.125 MG tablet Take 0.125 mg by mouth every 4 (four) hours as needed for cramping.    Yes [provider]  ipratropium (ATROVENT) 0.06 % nasal spray Place 2 sprays into both nostrils 2 (two) times daily.    Yes [provider]  lisinopril (PRINIVIL,ZESTRIL) 40 MG tablet Take 40 mg by mouth daily.    Yes [provider]  Multiple  Vitamins-Minerals (ICAPS AREDS 2) CAPS Take 1 capsule by mouth 2 (two) times daily.   Yes [provider]  ondansetron (ZOFRAN) 4 MG tablet Take 1 tablet (4 mg total) by mouth every 8 (eight) hours as needed for nausea. 04/18/17  Yes Armbruster, Carlota Raspberry, MD  oxybutynin (DITROPAN) 5 MG tablet Take 5-10 mg by mouth See admin instructions. Take 1 tablet (5 mg) by mouth every morning and 2 tablets (10 mg) at night   Yes [provider]  pantoprazole (PROTONIX) 40 MG tablet Take 1 tablet (40 mg total) by mouth 2 (two) times daily. 12/04/17  Yes Armbruster, Carlota Raspberry, MD  Polyvinyl Alcohol-Povidone (REFRESH OP) Place 1 drop into both eyes 2 (two) times daily.   Yes [provider]  potassium chloride SA (K-DUR,KLOR-CON) 20 MEQ tablet Take 20 mEq by mouth daily as needed (ONLY WHEN TAKING TORSEMIDE).   Yes [provider]  promethazine (PHENERGAN) 12.5 MG tablet Take 1 tablet (12.5 mg total) by mouth every 6 (six) hours as needed for nausea or vomiting. 08/30/14  Yes Ghimire, Henreitta Leber, MD  ranitidine (ZANTAC) 150 MG tablet Take 150 mg by mouth at bedtime.    Yes [provider]  torsemide (DEMADEX) 20 MG tablet Take 20 mg by mouth daily.    Yes [provider]  warfarin (COUMADIN) 5 MG tablet TAKE AS DIRECTED BY COUMADIN CLINIC Patient taking differently: Take 2.5-5 mg by mouth See admin instructions. Take 1 tablet on Tuesday and Thursday then take 1/2 tablet all the other days 01/25/18  Yes Nahser, Wonda Cheng, MD    Scheduled Meds: . aspirin  324 mg Oral Once  . aspirin EC  81 mg Oral Daily  . atorvastatin  20 mg Oral q1800  . bismuth subsalicylate  30 mL Oral TID AC & HS  . busPIRone  15 mg Oral BID  . cycloSPORINE  1 drop Both Eyes BID  . diltiazem  120 mg Oral BID  . docusate sodium  100 mg Oral BID  . famotidine  20 mg Oral QHS  . lisinopril  40 mg Oral Daily  . multivitamin  2 tablet Oral BID  . oxybutynin  10 mg Oral QHS  . oxybutynin  5 mg  Oral Daily  . pantoprazole  40 mg Oral BID  . polyvinyl  alcohol  1 drop Both Eyes BID  . warfarin  2.5 mg Oral ONCE-1800  . Warfarin - Pharmacist Dosing Inpatient   Does not apply q1800   Infusions:  PRN Meds: acetaminophen, diazepam, hydrALAZINE, HYDROcodone-acetaminophen, hyoscyamine, nitroGLYCERIN, ondansetron (ZOFRAN) IV, ondansetron   Allergies as of 01/29/2018 - Review Complete 01/29/2018  Allergen Reaction Noted  . Amlodipine Swelling and Other (See Comments) 08/07/2014  . Amoxicillin Diarrhea, Nausea And Vomiting, and Other (See Comments) 01/06/2011  . Carafate [sucralfate] Swelling and Other (See Comments) 08/08/2014  . Cephalexin Diarrhea 01/06/2011  . Codeine Nausea And Vomiting 01/06/2011  . Irbesartan Swelling and Other (See Comments) 11/07/2013  . Morphine and related Other (See Comments) 01/06/2011  . Neurontin [gabapentin] Swelling and Other (See Comments) 12/26/2011  . Nitrofurantoin monohyd macro Diarrhea and Nausea And Vomiting 01/06/2011  . Prednisone Other (See Comments) 06/15/2011  . Sulfa antibiotics Rash 01/01/2012    Family History  Problem Relation Age of Onset  . Heart disease Father   . Stomach cancer Maternal Grandmother   . Esophageal cancer Maternal Grandfather   . Bipolar disorder Son        Committed Suicide  . Coronary artery disease Brother   . Bipolar disorder Brother        commited suiside at 10yo  . Alcohol abuse Sister        sister #1  . Alcohol abuse Sister        sister #2  . Colon cancer Maternal Aunt   . Lung disease Neg Hx     Social History   Socioeconomic History  . Marital status: Widowed    Spouse name: Not on file  . Number of children: 4  . Years of education: Not on file  . Highest education level: Not on file  Occupational History  . Occupation: retired  Scientific laboratory technician  . Financial resource strain: Not on file  . Food insecurity:    Worry: Not on file    Inability: Not on file  . Transportation needs:     Medical: Not on file    Non-medical: Not on file  Tobacco Use  . Smoking status: Former Smoker    Packs/day: 0.50    Years: 40.00    Pack years: 20.00    Types: Cigarettes    Start date: 05/06/1949    Last attempt to quit: 03/06/1990    Years since quitting: 27.9  . Smokeless tobacco: Never Used  Substance and Sexual Activity  . Alcohol use: No    Alcohol/week: 0.0 standard drinks    Comment: Remote social EtOH  . Drug use: No  . Sexual activity: Never  Lifestyle  . Physical activity:    Days per week: Not on file    Minutes per session: Not on file  . Stress: Not on file  Relationships  . Social connections:    Talks on phone: Not on file    Gets together: Not on file    Attends religious service: Not on file    Active member of club or organization: Not on file    Attends meetings of clubs or organizations: Not on file    Relationship status: Not on file  . Intimate partner violence:    Fear of current or ex partner: Not on file    Emotionally abused: Not on file    Physically abused: Not on file    Forced sexual activity: Not on file  Other Topics Concern  . Not on file  Social  History Narrative   She is originally from [place], [place]. She has previously lived in [place] for 10 years. She has traveled to all 61 states as well as every [place] in [place]. Prior travel to [place], [place], [place]. Previously worked as an Optometrist. Remote exposure to a parakeet for 2 years in 32s. No known asbestos, mold, or hot tub exposure. She enjoys doing needle point & hand crafts.      REVIEW OF SYSTEMS: Constitutional: Weakness, fatigue. ENT:  No nose bleeds Pulm: No cough, no shortness of breath. CV:  No palpitations, no LE edema.  GU:  No hematuria, no frequency GI: Per HPI. Heme: No excessive or unusual bleeding or bruising. Transfusions: None per record. Neuro: Chronic back pain.  Recent dizziness in the last couple of weeks.  No headaches, no peripheral tingling or  numbness Derm:  No itching, no rash or sores.  Endocrine:  No sweats or chills.  No polyuria or dysuria Immunization: Not inquire as to recent vaccinations. Travel:  None beyond local counties in last few months.    PHYSICAL EXAM: Vital signs in last 24 hours: Vitals:   01/31/18 1321 01/31/18 1322  BP: (!) 106/50 (!) 110/49  Pulse: (!) 43   Resp: 15   Temp: 97.9 F (36.6 C)   SpO2: 96%    Wt Readings from Last 3 Encounters:  01/31/18 56.3 kg  11/06/17 58.6 kg  10/17/17 59.7 kg    General: Depressed, aged, somewhat chronically ill looking, pale WF. Head: No facial asymmetry or swelling, no signs of head trauma. Eyes: EOMI.  No scleral icterus or conjunctival pallor. Ears: sLightly hard of hearing. Nose: No discharge Mouth: Moist, pink, clear oral mucosa.  Tongue midline.  Full dentures in place. Neck: No JVD, no masses, no thyromegaly. Lungs: Decreased overall breath sounds but clear.  No cough, no labored breathing. Heart: RRR..  S1, S2 present. Abdomen: Soft.  Mild to moderate tenderness in the epigastrium fairly focal.  No guarding or rebound.  Bowel sounds active.  No distention.  No HSM, bruits, masses, hernias..   Rectal: Deferred. Musc/Skeltl: Kyphosis. Extremities: No CCE.  Limbs overall thin. Neurologic: Oriented x3.  Moves all 4 limbs, strength not tested.  No tremors.  No gross neurologic deficits. Skin: No telangiectasia, rashes, suspicious lesions. Tattoos: None Nodes: No cervical adenopathy. Psych: Flat affect, fluid speech.  Looks depressed and minimally anxious..  Intake/Output from previous day: 10/22 0701 - 10/23 0700 In: 1991.5 [P.O.:1080; I.V.:911.5] Out: 1600 [Urine:1600] Intake/Output this shift: Total I/O In: 240 [P.O.:240] Out: 200 [Urine:200]  LAB RESULTS: Recent Labs    01/29/18 1545  WBC 8.1  HGB 13.8  HCT 41.8  PLT 196   BMET Lab Results  Component Value Date   NA 126 (L) 01/31/2018   NA 127 (L) 01/30/2018   NA 127 (L)  01/29/2018   K 4.3 01/31/2018   K 3.8 01/30/2018   K 3.9 01/29/2018   CL 92 (L) 01/31/2018   CL 95 (L) 01/30/2018   CL 89 (L) 01/29/2018   CO2 27 01/31/2018   CO2 24 01/30/2018   CO2 25 01/29/2018   GLUCOSE 91 01/31/2018   GLUCOSE 94 01/30/2018   GLUCOSE 104 (H) 01/29/2018   BUN 7 (L) 01/31/2018   BUN 11 01/30/2018   BUN 21 01/29/2018   CREATININE 0.74 01/31/2018   CREATININE 0.74 01/30/2018   CREATININE 1.11 (H) 01/29/2018   CALCIUM 8.7 (L) 01/31/2018   CALCIUM 8.7 (L) 01/30/2018   CALCIUM  9.2 01/29/2018   LFT No results for input(s): PROT, ALBUMIN, AST, ALT, ALKPHOS, BILITOT, BILIDIR, IBILI in the last 72 hours. PT/INR Lab Results  Component Value Date   INR 2.27 01/31/2018   INR 2.08 01/30/2018   INR 2.8 01/16/2018   Hepatitis Panel No results for input(s): HEPBSAG, HCVAB, HEPAIGM, HEPBIGM in the last 72 hours. C-Diff No components found for: CDIFF Lipase     Component Value Date/Time   LIPASE 30 09/29/2016 1528    Drugs of Abuse  No results found for: LABOPIA, COCAINSCRNUR, LABBENZ, AMPHETMU, THCU, LABBARB   RADIOLOGY STUDIES: Dg Chest 2 View  Result Date: 01/29/2018 CLINICAL DATA:  Chest pain EXAM: CHEST - 2 VIEW COMPARISON:  08/25/2017 FINDINGS: Post sternotomy changes with valve prosthesis. Dense mitral calcification. Mild cardiomegaly. No pleural effusion or focal opacity. Aortic atherosclerosis. No pneumothorax. IMPRESSION: No active cardiopulmonary disease.  Mild cardiomegaly. Electronically Signed   By: Donavan Foil M.D.   On: 01/29/2018 16:55     IMPRESSION:   *    Epigastric pain, nausea, anorexia. Long history of the same which had been under better control with BuSpar. Symptoms started triggering again after treatment with antibiotics and then gentamicin injections performed by her urologist. Question could she have Candida or other infectious esophagitis causing her flare of symptoms? Patient is willing to undergo upper endoscopy.  *      Aortic valve replacement.  On chronic Coumadin.  INR therapeutic.  *     Interstitial cystitis.  *     Hyponatremia.  *     Likely cirrhosis of the liver, not decompensated.  INR is elevated because of Coumadin.   PLAN:     *    Set her up for EGD tomorrow.  Given her Coumadin is still active and not on hold, this is a diagnostic study.  *    I stopped the bismuth which was ordered 3 times a day, she is already a bit on the constipated side with her last bowel movement being this weekend, bismuth is not helping her upper GI symptoms much and is certainly likely to exacerbate any tendency to constipation.   Azucena Freed  01/31/2018, 2:07 PM Phone [phone]

## 2018-01-31 NOTE — Progress Notes (Addendum)
Requested stool softner for patient due to pain medication being given every 6 hours.  Patient is able to ambulate with walker to the bathkrrom, has been complaining of abdominal pain due to hietal hernia, tolerates ambulation well.  I will keep monitoring patient.

## 2018-01-31 NOTE — Progress Notes (Signed)
ANTICOAGULATION CONSULT NOTE - Follow Up Consult  Pharmacy Consult for Coumadin Indication: atrial fibrillation  Allergies  Allergen Reactions  . Amlodipine Swelling and Other (See Comments)     Unspecified swelling reaction  . Amoxicillin Diarrhea, Nausea And Vomiting and Other (See Comments)    Has patient had a PCN reaction causing immediate rash, facial/tongue/throat swelling, SOB or lightheadedness with hypotension: No Has patient had a PCN reaction causing severe rash involving mucus membranes or skin necrosis: No Has patient had a PCN reaction that required hospitalization: No Has patient had a PCN reaction occurring within the last 10 years: No If all of the above answers are "NO", then may proceed with Cephalosporin use.  . Carafate [Sucralfate] Swelling and Other (See Comments)    Reaction:  Knee swelling/redness   . Cephalexin Diarrhea  . Codeine Nausea And Vomiting  . Irbesartan Swelling and Other (See Comments)    Reaction:  Facial/hand swelling and numbness   . Morphine And Related Other (See Comments)    Pt states that she has a history of addiction with this medication.      . Neurontin [Gabapentin] Swelling and Other (See Comments)    Reaction:  Leg swelling   . Nitrofurantoin Monohyd Macro Diarrhea and Nausea And Vomiting  . Prednisone Other (See Comments)    Reaction:  Elevated BP  . Sulfa Antibiotics Rash    Patient Measurements: Height: 5\' 1"  (154.9 cm) Weight: 124 lb 3.2 oz (56.3 kg) IBW/kg (Calculated) : 47.8  Vital Signs: Temp: 97.9 F (36.6 C) (10/23 1321) Temp Source: Oral (10/23 1321) BP: 110/49 (10/23 1322) Pulse Rate: 43 (10/23 1321)  Labs: Recent Labs    01/29/18 1545 01/30/18 0105 01/30/18 0306 01/30/18 0814 01/30/18 1412 01/31/18 0422  HGB 13.8  --   --   --   --   --   HCT 41.8  --   --   --   --   --   PLT 196  --   --   --   --   --   LABPROT  --  23.1*  --   --   --  24.7*  INR  --  2.08  --   --   --  2.27  CREATININE 1.11*   --   --  0.74  --  0.74  TROPONINI  --   --  <0.03 <0.03 <0.03  --     Estimated Creatinine Clearance: 36.7 mL/min (by C-G formula based on SCr of 0.74 mg/dL).  Assessment:  84yo female admitted 10/22 with CP and dizziness x1wk, initial w/u in ED unremarkable, admitted for further evaluation.  Continues on Coumadin for atrial fibrillation.   INR 2.27, remains therapeutic.  Aspirin 81 mg daily added on admit.  Noted GI consult planned, as patient reported pain feels like when she had ulcer.  On Pepcid qhs (for PTA Zantac) and Protonix BID. Pepto-bismol prn added.   Home Coumadin regimen: 2.5 mg daily except 5 mg on Tuesdays and Thursdays.  Goal of Therapy:  INR 2-3 Monitor platelets by anticoagulation protocol: Yes   Plan:   Coumadin 2.5 mg again today, usual Wednesday dose.  Daily PT/INR  Follow up GI consult/plans.  Arty Baumgartner, [place] Pager: [phone] or phone: [phone] 01/31/2018,1:45 PM

## 2018-01-31 NOTE — Consult Note (Signed)
[facility] Consult: 2:07 PM 01/31/2018  LOS: 0 days    Referring Provider: Dr. Eliseo Squires Primary Care Physician:  Mayra Neer, MD Primary Gastroenterologist:  Dr. Havery Moros     Reason for Consultation:  Epigastric pain.   HPI: Brittany [name] is a 84 y.o. female.  Past medical history include aortic valve replacement, diastolic heart failure.  Hypertension.  Atrial fibrillation.  TIA.  History MI, CAD.  Chronic Coumadin.  Interstitial cystitis.  CKD 3.  2006 colonoscopy, diverticulosis, hemorrhoids, hyperplastic polyp. 06/2014 CT abdomen pelvis showed atherosclerosis of the SMA and celiac, stable since 2013. 07/2014 gastric emptying study.  Normal. 08/2014 EGD with gastric erythema.  Biopsy showed reactive changes with gastrointestinal metaplasia.  H. pylori was negative. 06/2015 barium esophagram:   Nonspecific motility disorder, 13 mm tablet passed readily through the esophagus 06/2016 abdominal ultrasound showed prior cholecystectomy.  Inhomogeneous liver with irregular contours suggesting early cirrhosis.  CBD 7.2 mm.  Portal vein patent with normal directional blood flow.  Elastography showed shear wave velocity calculated at 1.54 m/sec.  Metavir fibrosis score is  F2 + some F3, corresponding with moderate risk of fibrosis. Hepatitis B and C serologies negative.  Iron studies negative.  Alpha-1 antitrypsin negative.  No significant history of alcohol abuse.  A couple of years ago she was evaluated by Dr. Havery Moros for chronic nausea and dyspepsia despite using daily Zantac and Protonix.  Which he attributed to multiple meds, especially chronic narcotics.  He recommended minimizing narcotics and stopping her use of Ativan.   She treats her nausea, which is present when she wakes up in the morning, with  Phenergan first thing in the day.  Zofran does not help very much.  She complains of postprandial nausea and discomfort, abdominal fullness even with small meals. Although it was suggested that she try FD guard, she did not.  She was started on BuSpar 15 mg TID in January 2018.  In June 2018 she was hospitalized with abdominal pain.  CT showed inflammation in the ascending, transverse, descending colon.  CT angio then pursued to rule out ischemia but this showed no vascular compromise.  On CT angio the colonic inflammation had resolved.  Patient vigorously declined repeat colonoscopy offered by Dr. Havery Moros in December 2018.  Last office visit with Dr. Havery Moros was in July 2019 for follow-up of her dyspepsia.  The BuSpar was helping.  She was eating 3 meals a day without vomiting.  Weight was stable.  Protonix once daily was controlling her reflux symptoms.  Patient continued to do well this summer and into the fall.  In late September, early October she was treated for what Dr. Sandrea Matte felt was a UTI with doxycycline and Pyridium.  Her symptoms did not improve and on 10/7, 10/8, 10/9 she was treated with injections of gentamicin.  It was after this that she started to feel weak, nauseated and lost her appetite.  She also complained of dizziness.  This Monday, 10/21 she developed severe epigastric distress and pressure.  She is now admitted for her symptoms.  GI  cocktail helped for several hours.  Vicodin does not really help.  Bismuth does not help.  Labs show hyponatremia at 126.  Normal renal function.  LFTs have not been obtained.  No elevated WBC count.  INR 2.2. CXR shows no heart failure or pneumonia.    Past Medical History:  Diagnosis Date  . Abnormal liver diagnostic imaging    suspected cirrhosis  . Arthritis   . Atrial fibrillation ([place])   . Chronic back pain   . Diverticulosis   . Dyslipidemia    takes Lipitor daily  . Dysrhythmia    Atrial Fibrillation  . Foot drop    SINCE  1987  . GERD (gastroesophageal reflux disease)   . H/O hiatal hernia   . Hearing impaired    Bilateral hearing aids  . History of bacterial endocarditis   . History of gastritis   . History of stomach ulcers    many yrs ago  . History of TIA (transient ischemic attack)    2013-- RESIDUAL PERIPHERAL VISION RIGHT EYE--  RESOLVED  . Hypertension   . IC (interstitial cystitis)   . Pulmonary hypertension ([place]) 10/2013   Based on TTE  . S/P aortic valve replacement    2005  . Urine incontinence     Past Surgical History:  Procedure Laterality Date  . ABDOMINAL HYSTERECTOMY  1967  . AORTIC VALVE REPLACEMENT  09-29-2003    [facility] pericardial tissue valve  . APPENDECTOMY  1933  . CARDIAC CATHETERIZATION  07-15-2003 DR HELEN PRESTON   MODERATE TO MODERATELY SEVERE CALCIFIC AORTIC STENOSIS/  NORMAL CORONARY ARTERIES  . CARDIOVASCULAR STRESS TEST  01-30-2012  DR NASHER   NORMAL NUCLEAR STUDY/  EF 73%/  NORMAL LVF  . CARPAL TUNNEL RELEASE Bilateral   . CATARACT EXTRACTION W/ INTRAOCULAR LENS  IMPLANT, BILATERAL    . CHOLECYSTECTOMY  1993  . CYSTO WITH HYDRODISTENSION N/A 02/05/2013   Procedure: CYSTOSCOPY/HYDRODISTENSION;  Surgeon: Irine Seal, MD;  [facility];  Service: Urology;  Laterality: N/A;  . CYSTO/ HYDRODISTENTION/ INSTILLATION CLORPACTIN  MULTIPLE  last one 2009  . DILATION AND CURETTAGE OF UTERUS    . ERCP N/A 08/10/2012   Procedure: ENDOSCOPIC RETROGRADE CHOLANGIOPANCREATOGRAPHY (ERCP);  Surgeon: Inda Castle, MD;  Location: [place];  Service: Gastroenterology;  Laterality: N/A;  . ESOPHAGOGASTRODUODENOSCOPY (EGD) WITH PROPOFOL N/A 08/18/2014   Procedure: ESOPHAGOGASTRODUODENOSCOPY (EGD) WITH PROPOFOL;  Surgeon: Inda Castle, MD;  Location: [facility];  Service: Endoscopy;  Laterality: N/A;  . LEFT HEART CATHETERIZATION WITH CORONARY ANGIOGRAM N/A 10/18/2013   Procedure: LEFT HEART CATHETERIZATION WITH CORONARY ANGIOGRAM;  Surgeon:  Jettie Booze, MD;  Location: [facility] CATH LAB;  Service: Cardiovascular;  Laterality: N/A;  . LUMBAR [place] &  2007  . MACROPLASTIQUE URETHRAL IMPLANTATION  08-27-2009  . MINI-OPEN RIGHT ROTATOR CUFF REPAIR  07-12-2001  . RIGHT SHOULDER ARTHROSCOPY W/ DEBRIDEMENT ROTATOR CUFF AND LABRAL TEAR/ ACROMINOPLASTY/ DISTAL CLAVICLE EXCISION/ CA LIGAMENT RELEASE  10-08-1999  . TONSILLECTOMY AND ADENOIDECTOMY    . TRANSTHORACIC ECHOCARDIOGRAM  08-10-2011   MILD LVH/  EF 55-60%/  NORMAL AVR TISSUE/ MILD MR/  MODERATE DILATED LA/  MILD DILATED RA  . TRANSVAGINAL TAPE PROCEDURE  07-30-2002  . VEIN LIGATION  1969   RIGHT LOWER LEG    Prior to Admission medications   Medication Sig Start Date End Date Taking? Authorizing Provider  atorvastatin (LIPITOR) 20 MG tablet Take 20 mg by mouth daily.   Yes [provider]  busPIRone (BUSPAR) 15 MG tablet TAKE ONE TABLET BY MOUTH TWICE A DAY Patient taking differently: Take 15 mg by mouth 2 (two) times daily.  12/15/17  Yes Armbruster, Carlota Raspberry, MD  cycloSPORINE (RESTASIS) 0.05 % ophthalmic emulsion Place 1 drop into both eyes 2 (two) times daily.   Yes [provider]  diazepam (VALIUM) 5 MG tablet Take 5 mg by mouth at bedtime as needed (sleep).    Yes [provider]  diltiazem (CARDIZEM) 120 MG tablet Take 120 mg by mouth 2 (two) times daily.   Yes [provider]  HYDROcodone-acetaminophen (NORCO/VICODIN) 5-325 MG per tablet Take 1 tablet by mouth every 6 (six) hours as needed (pain).    Yes [provider]  hyoscyamine (LEVSIN, ANASPAZ) 0.125 MG tablet Take 0.125 mg by mouth every 4 (four) hours as needed for cramping.    Yes [provider]  ipratropium (ATROVENT) 0.06 % nasal spray Place 2 sprays into both nostrils 2 (two) times daily.    Yes [provider]  lisinopril (PRINIVIL,ZESTRIL) 40 MG tablet Take 40 mg by mouth daily.    Yes [provider]  Multiple  Vitamins-Minerals (ICAPS AREDS 2) CAPS Take 1 capsule by mouth 2 (two) times daily.   Yes [provider]  ondansetron (ZOFRAN) 4 MG tablet Take 1 tablet (4 mg total) by mouth every 8 (eight) hours as needed for nausea. 04/18/17  Yes Armbruster, Carlota Raspberry, MD  oxybutynin (DITROPAN) 5 MG tablet Take 5-10 mg by mouth See admin instructions. Take 1 tablet (5 mg) by mouth every morning and 2 tablets (10 mg) at night   Yes [provider]  pantoprazole (PROTONIX) 40 MG tablet Take 1 tablet (40 mg total) by mouth 2 (two) times daily. 12/04/17  Yes Armbruster, Carlota Raspberry, MD  Polyvinyl Alcohol-Povidone (REFRESH OP) Place 1 drop into both eyes 2 (two) times daily.   Yes [provider]  potassium chloride SA (K-DUR,KLOR-CON) 20 MEQ tablet Take 20 mEq by mouth daily as needed (ONLY WHEN TAKING TORSEMIDE).   Yes [provider]  promethazine (PHENERGAN) 12.5 MG tablet Take 1 tablet (12.5 mg total) by mouth every 6 (six) hours as needed for nausea or vomiting. 08/30/14  Yes Ghimire, Henreitta Leber, MD  ranitidine (ZANTAC) 150 MG tablet Take 150 mg by mouth at bedtime.    Yes [provider]  torsemide (DEMADEX) 20 MG tablet Take 20 mg by mouth daily.    Yes [provider]  warfarin (COUMADIN) 5 MG tablet TAKE AS DIRECTED BY COUMADIN CLINIC Patient taking differently: Take 2.5-5 mg by mouth See admin instructions. Take 1 tablet on Tuesday and Thursday then take 1/2 tablet all the other days 01/25/18  Yes Nahser, Wonda Cheng, MD    Scheduled Meds: . aspirin  324 mg Oral Once  . aspirin EC  81 mg Oral Daily  . atorvastatin  20 mg Oral q1800  . bismuth subsalicylate  30 mL Oral TID AC & HS  . busPIRone  15 mg Oral BID  . cycloSPORINE  1 drop Both Eyes BID  . diltiazem  120 mg Oral BID  . docusate sodium  100 mg Oral BID  . famotidine  20 mg Oral QHS  . lisinopril  40 mg Oral Daily  . multivitamin  2 tablet Oral BID  . oxybutynin  10 mg Oral QHS  . oxybutynin  5 mg  Oral Daily  . pantoprazole  40 mg Oral BID  . polyvinyl  alcohol  1 drop Both Eyes BID  . warfarin  2.5 mg Oral ONCE-1800  . Warfarin - Pharmacist Dosing Inpatient   Does not apply q1800   Infusions:  PRN Meds: acetaminophen, diazepam, hydrALAZINE, HYDROcodone-acetaminophen, hyoscyamine, nitroGLYCERIN, ondansetron (ZOFRAN) IV, ondansetron   Allergies as of 01/29/2018 - Review Complete 01/29/2018  Allergen Reaction Noted  . Amlodipine Swelling and Other (See Comments) 08/07/2014  . Amoxicillin Diarrhea, Nausea And Vomiting, and Other (See Comments) 01/06/2011  . Carafate [sucralfate] Swelling and Other (See Comments) 08/08/2014  . Cephalexin Diarrhea 01/06/2011  . Codeine Nausea And Vomiting 01/06/2011  . Irbesartan Swelling and Other (See Comments) 11/07/2013  . Morphine and related Other (See Comments) 01/06/2011  . Neurontin [gabapentin] Swelling and Other (See Comments) 12/26/2011  . Nitrofurantoin monohyd macro Diarrhea and Nausea And Vomiting 01/06/2011  . Prednisone Other (See Comments) 06/15/2011  . Sulfa antibiotics Rash 01/01/2012    Family History  Problem Relation Age of Onset  . Heart disease Father   . Stomach cancer Maternal Grandmother   . Esophageal cancer Maternal Grandfather   . Bipolar disorder Son        Committed Suicide  . Coronary artery disease Brother   . Bipolar disorder Brother        commited suiside at 91yo  . Alcohol abuse Sister        sister #1  . Alcohol abuse Sister        sister #2  . Colon cancer Maternal Aunt   . Lung disease Neg Hx     Social History   Socioeconomic History  . Marital status: Widowed    Spouse name: Not on file  . Number of children: 4  . Years of education: Not on file  . Highest education level: Not on file  Occupational History  . Occupation: retired  Scientific laboratory technician  . Financial resource strain: Not on file  . Food insecurity:    Worry: Not on file    Inability: Not on file  . Transportation needs:     Medical: Not on file    Non-medical: Not on file  Tobacco Use  . Smoking status: Former Smoker    Packs/day: 0.50    Years: 40.00    Pack years: 20.00    Types: Cigarettes    Start date: 05/06/1949    Last attempt to quit: 03/06/1990    Years since quitting: 27.9  . Smokeless tobacco: Never Used  Substance and Sexual Activity  . Alcohol use: No    Alcohol/week: 0.0 standard drinks    Comment: Remote social EtOH  . Drug use: No  . Sexual activity: Never  Lifestyle  . Physical activity:    Days per week: Not on file    Minutes per session: Not on file  . Stress: Not on file  Relationships  . Social connections:    Talks on phone: Not on file    Gets together: Not on file    Attends religious service: Not on file    Active member of club or organization: Not on file    Attends meetings of clubs or organizations: Not on file    Relationship status: Not on file  . Intimate partner violence:    Fear of current or ex partner: Not on file    Emotionally abused: Not on file    Physically abused: Not on file    Forced sexual activity: Not on file  Other Topics Concern  . Not on file  Social  History Narrative   She is originally from [place], [place]. She has previously lived in [place] for 10 years. She has traveled to all 49 states as well as every [place] in [place]. Prior travel to [place], [place], [place] [employer]. Previously worked as an Optometrist. Remote exposure to a parakeet for 2 years in 13s. No known asbestos, mold, or hot tub exposure. She enjoys doing needle point & hand crafts.      REVIEW OF SYSTEMS: Constitutional: Weakness, fatigue. ENT:  No nose bleeds Pulm: No cough, no shortness of breath. CV:  No palpitations, no LE edema.  GU:  No hematuria, no frequency GI: Per HPI. Heme: No excessive or unusual bleeding or bruising. Transfusions: None per record. Neuro: Chronic back pain.  Recent dizziness in the last couple of weeks.  No headaches, no peripheral tingling or  numbness Derm:  No itching, no rash or sores.  Endocrine:  No sweats or chills.  No polyuria or dysuria Immunization: Not inquire as to recent vaccinations. Travel:  None beyond local counties in last few months.    PHYSICAL EXAM: Vital signs in last 24 hours: Vitals:   01/31/18 1321 01/31/18 1322  BP: (!) 106/50 (!) 110/49  Pulse: (!) 43   Resp: 15   Temp: 97.9 F (36.6 C)   SpO2: 96%    Wt Readings from Last 3 Encounters:  01/31/18 56.3 kg  11/06/17 58.6 kg  10/17/17 59.7 kg    General: Depressed, aged, somewhat chronically ill looking, pale WF. Head: No facial asymmetry or swelling, no signs of head trauma. Eyes: EOMI.  No scleral icterus or conjunctival pallor. Ears: sLightly hard of hearing. Nose: No discharge Mouth: Moist, pink, clear oral mucosa.  Tongue midline.  Full dentures in place. Neck: No JVD, no masses, no thyromegaly. Lungs: Decreased overall breath sounds but clear.  No cough, no labored breathing. Heart: RRR..  S1, S2 present. Abdomen: Soft.  Mild to moderate tenderness in the epigastrium fairly focal.  No guarding or rebound.  Bowel sounds active.  No distention.  No HSM, bruits, masses, hernias..   Rectal: Deferred. Musc/Skeltl: Kyphosis. Extremities: No CCE.  Limbs overall thin. Neurologic: Oriented x3.  Moves all 4 limbs, strength not tested.  No tremors.  No gross neurologic deficits. Skin: No telangiectasia, rashes, suspicious lesions. Tattoos: None Nodes: No cervical adenopathy. Psych: Flat affect, fluid speech.  Looks depressed and minimally anxious..  Intake/Output from previous day: 10/22 0701 - 10/23 0700 In: 1991.5 [P.O.:1080; I.V.:911.5] Out: 1600 [Urine:1600] Intake/Output this shift: Total I/O In: 240 [P.O.:240] Out: 200 [Urine:200]  LAB RESULTS: Recent Labs    01/29/18 1545  WBC 8.1  HGB 13.8  HCT 41.8  PLT 196   BMET Lab Results  Component Value Date   NA 126 (L) 01/31/2018   NA 127 (L) 01/30/2018   NA 127 (L)  01/29/2018   K 4.3 01/31/2018   K 3.8 01/30/2018   K 3.9 01/29/2018   CL 92 (L) 01/31/2018   CL 95 (L) 01/30/2018   CL 89 (L) 01/29/2018   CO2 27 01/31/2018   CO2 24 01/30/2018   CO2 25 01/29/2018   GLUCOSE 91 01/31/2018   GLUCOSE 94 01/30/2018   GLUCOSE 104 (H) 01/29/2018   BUN 7 (L) 01/31/2018   BUN 11 01/30/2018   BUN 21 01/29/2018   CREATININE 0.74 01/31/2018   CREATININE 0.74 01/30/2018   CREATININE 1.11 (H) 01/29/2018   CALCIUM 8.7 (L) 01/31/2018   CALCIUM 8.7 (L) 01/30/2018   CALCIUM  9.2 01/29/2018   LFT No results for input(s): PROT, ALBUMIN, AST, ALT, ALKPHOS, BILITOT, BILIDIR, IBILI in the last 72 hours. PT/INR Lab Results  Component Value Date   INR 2.27 01/31/2018   INR 2.08 01/30/2018   INR 2.8 01/16/2018   Hepatitis Panel No results for input(s): HEPBSAG, HCVAB, HEPAIGM, HEPBIGM in the last 72 hours. C-Diff No components found for: CDIFF Lipase     Component Value Date/Time   LIPASE 30 09/29/2016 1528    Drugs of Abuse  No results found for: LABOPIA, COCAINSCRNUR, LABBENZ, AMPHETMU, THCU, LABBARB   RADIOLOGY STUDIES: Dg Chest 2 View  Result Date: 01/29/2018 CLINICAL DATA:  Chest pain EXAM: CHEST - 2 VIEW COMPARISON:  08/25/2017 FINDINGS: Post sternotomy changes with valve prosthesis. Dense mitral calcification. Mild cardiomegaly. No pleural effusion or focal opacity. Aortic atherosclerosis. No pneumothorax. IMPRESSION: No active cardiopulmonary disease.  Mild cardiomegaly. Electronically Signed   By: Donavan Foil M.D.   On: 01/29/2018 16:55     IMPRESSION:   *    Epigastric pain, nausea, anorexia. Long history of the same which had been under better control with BuSpar. Symptoms started triggering again after treatment with antibiotics and then gentamicin injections performed by her urologist. Question could she have Candida or other infectious esophagitis causing her flare of symptoms? Patient is willing to undergo upper endoscopy.  *      Aortic valve replacement.  On chronic Coumadin.  INR therapeutic.  *     Interstitial cystitis.  *     Hyponatremia.  *     Likely cirrhosis of the liver, not decompensated.  INR is elevated because of Coumadin.   PLAN:     *    Set her up for EGD tomorrow.  Given her Coumadin is still active and not on hold, this is a diagnostic study.  *    I stopped the bismuth which was ordered 3 times a day, she is already a bit on the constipated side with her last bowel movement being this weekend, bismuth is not helping her upper GI symptoms much and is certainly likely to exacerbate any tendency to constipation.   Azucena Freed  01/31/2018, 2:07 PM Phone [phone]

## 2018-01-31 NOTE — Progress Notes (Signed)
Progress Note    Brittany [name]  NID:[id number] DOB: May 28, 1927  DOA: 01/29/2018 PCP: Mayra Neer, MD    Brief Narrative:     Medical records reviewed and are as summarized below:  Brittany [name] is an 84 y.o. female with medical history significant for atrial fibrillation on warfarin, aortic valve repair, CAD on remote cath, CKD 3, hypertension, and anxiety, now presenting to the emergency department for evaluation of chest pain.  Patient reports that she has been experiencing intermittent pain in the central chest for the past week, worse yesterday while out shopping.  Pain is described as pressure, sometimes radiating towards the left arm, intermittent.  She denies any significant shortness of breath and denies diaphoresis, but reports some nausea associated with this.  Pain was worse yesterday when she was out shopping, but otherwise she cannot identify any alleviating or exacerbating factors.  She denies any fevers or chills denies lower extremity swelling or tenderness.  Assessment/Plan:   Principal Problem:   Chest pain Active Problems:   Hypertension   Chronic atrial fibrillation (HCC)   Abdominal pain, epigastric   S/P AVR (aortic valve replacement)   Chronic diastolic heart failure (HCC)   Hyponatremia   CKD (chronic kidney disease), stage III (HCC)   H/O hiatal hernia   Chest pain due to probable hiatal hernia -dietary changes have been done per patient yet continues to have issues -PPI BID plus zantac -"allergy to carafate"-- will try pepto with meals -GI Consult as patient states this pain feels like when she had an ulcer -seen by cardiology: hold off on any further cardiac testing unless there is clear objective evidence pointing [place] in the direction for further work-up.  Permanent a fib -coumadin -rate controlled  Hyponatremia -suspect SIADH (urine Na 45) -fluid restrict -trend Na    Family Communication/Anticipated D/C date and plan/Code Status    DVT prophylaxis: coumadin Code Status: DNR.  Family Communication: none at bedside Disposition Plan:    Medical Consultants:    Cards  GI  Subjective:   Still with "horrible" pain needing oral pain medications "I can not go home in all of this pain"  Objective:    Vitals:   01/30/18 1300 01/30/18 2239 01/31/18 0435 01/31/18 0951  BP: (!) 150/68 128/64 (!) 152/71 136/65  Pulse: 65  (!) 53   Resp:   14   Temp: 98.7 F (37.1 C)  97.7 F (36.5 C)   TempSrc: Oral  Oral   SpO2: 97%  97%   Weight:   56.3 kg   Height:        Intake/Output Summary (Last 24 hours) at 01/31/2018 1114 Last data filed at 01/31/2018 0900 Gross per 24 hour  Intake 1811.51 ml  Output 1600 ml  Net 211.51 ml   Filed Weights   01/29/18 1532 01/30/18 0222 01/31/18 0435  Weight: 55.3 kg 54.5 kg 56.3 kg    Exam: Hard of hearing, resting in bed, NAD but irritable irr but rate controlled  clear, no increased work of breathing Epigastric pain with palpation No LE edema  Data Reviewed:   I have personally reviewed following labs and imaging studies:  Labs: Labs show the following:   Basic Metabolic Panel: Recent Labs  Lab 01/29/18 1545 01/30/18 0814 01/31/18 0422  NA 127* 127* 126*  K 3.9 3.8 4.3  CL 89* 95* 92*  CO2 25 24 27   GLUCOSE 104* 94 91  BUN 21 11 7*  CREATININE 1.11* 0.74 0.74  CALCIUM 9.2 8.7* 8.7*   GFR Estimated Creatinine Clearance: 36.7 mL/min (by C-G formula based on SCr of 0.74 mg/dL). Liver Function Tests: No results for input(s): AST, ALT, ALKPHOS, BILITOT, PROT, ALBUMIN in the last 168 hours. No results for input(s): LIPASE, AMYLASE in the last 168 hours. No results for input(s): AMMONIA in the last 168 hours. Coagulation profile Recent Labs  Lab 01/30/18 0105 01/31/18 0422  INR 2.08 2.27    CBC: Recent Labs  Lab 01/29/18 1545  WBC 8.1  HGB 13.8  HCT 41.8  MCV 87.6  PLT 196   Cardiac Enzymes: Recent Labs  Lab 01/30/18 0306  01/30/18 0814 01/30/18 1412  TROPONINI <0.03 <0.03 <0.03   BNP (last 3 results) No results for input(s): PROBNP in the last 8760 hours. CBG: No results for input(s): GLUCAP in the last 168 hours. D-Dimer: No results for input(s): DDIMER in the last 72 hours. Hgb A1c: No results for input(s): HGBA1C in the last 72 hours. Lipid Profile: No results for input(s): CHOL, HDL, LDLCALC, TRIG, CHOLHDL, LDLDIRECT in the last 72 hours. Thyroid function studies: No results for input(s): TSH, T4TOTAL, T3FREE, THYROIDAB in the last 72 hours.  Invalid input(s): FREET3 Anemia work up: No results for input(s): VITAMINB12, FOLATE, FERRITIN, TIBC, IRON, RETICCTPCT in the last 72 hours. Sepsis Labs: Recent Labs  Lab 01/29/18 1545  WBC 8.1    Microbiology No results found for this or any previous visit (from the past 240 hour(s)).  Procedures and diagnostic studies:  Dg Chest 2 View  Result Date: 01/29/2018 CLINICAL DATA:  Chest pain EXAM: CHEST - 2 VIEW COMPARISON:  08/25/2017 FINDINGS: Post sternotomy changes with valve prosthesis. Dense mitral calcification. Mild cardiomegaly. No pleural effusion or focal opacity. Aortic atherosclerosis. No pneumothorax. IMPRESSION: No active cardiopulmonary disease.  Mild cardiomegaly. Electronically Signed   By: Donavan Foil M.D.   On: 01/29/2018 16:55    Medications:   . aspirin  324 mg Oral Once  . aspirin EC  81 mg Oral Daily  . atorvastatin  20 mg Oral q1800  . bismuth subsalicylate  30 mL Oral TID AC & HS  . busPIRone  15 mg Oral BID  . cycloSPORINE  1 drop Both Eyes BID  . diltiazem  120 mg Oral BID  . docusate sodium  100 mg Oral BID  . famotidine  20 mg Oral QHS  . lisinopril  40 mg Oral Daily  . multivitamin  2 tablet Oral BID  . oxybutynin  10 mg Oral QHS  . oxybutynin  5 mg Oral Daily  . pantoprazole  40 mg Oral BID  . polyvinyl alcohol  1 drop Both Eyes BID  . Warfarin - Pharmacist Dosing Inpatient   Does not apply q1800    Continuous Infusions:   LOS: 0 days   Geradine Girt  [facility]   *Please refer to [email or web address], password [email or web address] to get updated schedule on who will round on this patient, as hospitalists switch teams weekly. If 7PM-7AM, please contact night-coverage at [email or web address], password [email or web address] for any overnight needs.  01/31/2018, 11:14 AM

## 2018-02-01 ENCOUNTER — Encounter (HOSPITAL_COMMUNITY)
Admission: EM | Disposition: A | Discharge status: Home / Self Care | Payer: Self-pay | Source: Home / Self Care | Attending: Internal Medicine

## 2018-02-01 ENCOUNTER — Encounter (HOSPITAL_COMMUNITY): Payer: Self-pay | Admitting: Anesthesiology

## 2018-02-01 DIAGNOSIS — K259 Gastric ulcer, unspecified as acute or chronic, without hemorrhage or perforation: Principal | ICD-10-CM

## 2018-02-01 DIAGNOSIS — K222 Esophageal obstruction: Secondary | ICD-10-CM

## 2018-02-01 DIAGNOSIS — R1013 Epigastric pain: Secondary | ICD-10-CM

## 2018-02-01 DIAGNOSIS — T18128A Food in esophagus causing other injury, initial encounter: Secondary | ICD-10-CM

## 2018-02-01 DIAGNOSIS — K209 Esophagitis, unspecified: Secondary | ICD-10-CM

## 2018-02-01 DIAGNOSIS — E871 Hypo-osmolality and hyponatremia: Secondary | ICD-10-CM

## 2018-02-01 DIAGNOSIS — I8500 Esophageal varices without bleeding: Secondary | ICD-10-CM

## 2018-02-01 DIAGNOSIS — K228 Other specified diseases of esophagus: Secondary | ICD-10-CM

## 2018-02-01 HISTORY — PX: BIOPSY: SHX5522

## 2018-02-01 HISTORY — PX: ESOPHAGOGASTRODUODENOSCOPY (EGD) WITH PROPOFOL: SHX5813

## 2018-02-01 HISTORY — PX: FOREIGN BODY REMOVAL: SHX962

## 2018-02-01 LAB — BASIC METABOLIC PANEL
ANION GAP: 6 (ref 5–15)
BUN: 8 mg/dL (ref 8–23)
CALCIUM: 8.4 mg/dL — AB (ref 8.9–10.3)
CO2: 25 mmol/L (ref 22–32)
CREATININE: 0.66 mg/dL (ref 0.44–1.00)
Chloride: 95 mmol/L — ABNORMAL LOW (ref 98–111)
Glucose, Bld: 87 mg/dL (ref 70–99)
Potassium: 4.2 mmol/L (ref 3.5–5.1)
Sodium: 126 mmol/L — ABNORMAL LOW (ref 135–145)

## 2018-02-01 LAB — CBC
HEMATOCRIT: 34 % — AB (ref 36.0–46.0)
Hemoglobin: 11.3 g/dL — ABNORMAL LOW (ref 12.0–15.0)
MCH: 28.8 pg (ref 26.0–34.0)
MCHC: 33.2 g/dL (ref 30.0–36.0)
MCV: 86.7 fL (ref 80.0–100.0)
NRBC: 0 % (ref 0.0–0.2)
Platelets: 175 10*3/uL (ref 150–400)
RBC: 3.92 MIL/uL (ref 3.87–5.11)
RDW: 14 % (ref 11.5–15.5)
WBC: 8 10*3/uL (ref 4.0–10.5)

## 2018-02-01 LAB — PROTIME-INR
INR: 2.42
Prothrombin Time: 26 seconds — ABNORMAL HIGH (ref 11.4–15.2)

## 2018-02-01 SURGERY — ESOPHAGOGASTRODUODENOSCOPY (EGD) WITH PROPOFOL
Anesthesia: Moderate Sedation

## 2018-02-01 MED ORDER — MIDAZOLAM HCL 5 MG/ML IJ SOLN
INTRAMUSCULAR | Status: AC
  Filled 2018-02-01: qty 2

## 2018-02-01 MED ORDER — LACTATED RINGERS IV SOLN
INTRAVENOUS | Status: DC
  Administered 2018-02-01: 13:00:00 via INTRAVENOUS

## 2018-02-01 MED ORDER — BUTAMBEN-TETRACAINE-BENZOCAINE 2-2-14 % EX AERO
INHALATION_SPRAY | CUTANEOUS | Status: DC | PRN
  Administered 2018-02-01: 2 via TOPICAL

## 2018-02-01 MED ORDER — FENTANYL CITRATE (PF) 100 MCG/2ML IJ SOLN
INTRAMUSCULAR | Status: AC
  Filled 2018-02-01: qty 2

## 2018-02-01 MED ORDER — FENTANYL CITRATE (PF) 100 MCG/2ML IJ SOLN
INTRAMUSCULAR | Status: DC | PRN
  Administered 2018-02-01 (×3): 25 ug via INTRAVENOUS

## 2018-02-01 MED ORDER — MIDAZOLAM HCL 10 MG/2ML IJ SOLN
INTRAMUSCULAR | Status: DC | PRN
  Administered 2018-02-01: 2 mg via INTRAVENOUS
  Administered 2018-02-01: 1 mg via INTRAVENOUS
  Administered 2018-02-01: 2 mg via INTRAVENOUS

## 2018-02-01 MED ORDER — POLYETHYLENE GLYCOL 3350 17 G PO PACK
17.0000 g | PACK | Freq: Every day | ORAL | Status: DC | PRN
  Administered 2018-02-01 – 2018-02-02 (×2): 17 g via ORAL
  Filled 2018-02-01 (×2): qty 1

## 2018-02-01 MED ORDER — DIPHENHYDRAMINE HCL 50 MG/ML IJ SOLN
INTRAMUSCULAR | Status: AC
  Filled 2018-02-01: qty 1

## 2018-02-01 MED ORDER — WARFARIN SODIUM 5 MG PO TABS: 5.0000 mg | ORAL_TABLET | Freq: Once | ORAL | Status: DC

## 2018-02-01 MED ORDER — DIPHENHYDRAMINE HCL 50 MG/ML IJ SOLN
INTRAMUSCULAR | Status: DC | PRN
  Administered 2018-02-01: 25 mg via INTRAVENOUS

## 2018-02-01 SURGICAL SUPPLY — 15 items

## 2018-02-01 NOTE — Progress Notes (Addendum)
ANTICOAGULATION CONSULT NOTE - Follow Up Consult  Pharmacy Consult for Coumadin Indication: atrial fibrillation  Allergies  Allergen Reactions  . Amlodipine Swelling and Other (See Comments)     Unspecified swelling reaction  . Amoxicillin Diarrhea, Nausea And Vomiting and Other (See Comments)    Has patient had a PCN reaction causing immediate rash, facial/tongue/throat swelling, SOB or lightheadedness with hypotension: No Has patient had a PCN reaction causing severe rash involving mucus membranes or skin necrosis: No Has patient had a PCN reaction that required hospitalization: No Has patient had a PCN reaction occurring within the last 10 years: No If all of the above answers are "NO", then may proceed with Cephalosporin use.  . Carafate [Sucralfate] Swelling and Other (See Comments)    Reaction:  Knee swelling/redness   . Cephalexin Diarrhea  . Codeine Nausea And Vomiting  . Irbesartan Swelling and Other (See Comments)    Reaction:  Facial/hand swelling and numbness   . Morphine And Related Other (See Comments)    Pt states that she has a history of addiction with this medication.      . Neurontin [Gabapentin] Swelling and Other (See Comments)    Reaction:  Leg swelling   . Nitrofurantoin Monohyd Macro Diarrhea and Nausea And Vomiting  . Prednisone Other (See Comments)    Reaction:  Elevated BP  . Sulfa Antibiotics Rash    Patient Measurements: Height: 5\' 1"  (154.9 cm) Weight: 123 lb 0.3 oz (55.8 kg) IBW/kg (Calculated) : 47.8  Vital Signs: Temp: 98 F (36.7 C) (10/24 0500) Temp Source: Oral (10/24 0500) BP: 97/83 (10/24 0500) Pulse Rate: 58 (10/24 0500)  Labs: Recent Labs    01/29/18 1545 01/30/18 0105 01/30/18 0306 01/30/18 0814 01/30/18 1412 01/31/18 0422 02/01/18 0425  HGB 13.8  --   --   --   --   --  11.3*  HCT 41.8  --   --   --   --   --  34.0*  PLT 196  --   --   --   --   --  175  LABPROT  --  23.1*  --   --   --  24.7* 26.0*  INR  --  2.08  --    --   --  2.27 2.42  CREATININE 1.11*  --   --  0.74  --  0.74 0.66  TROPONINI  --   --  <0.03 <0.03 <0.03  --   --     Estimated Creatinine Clearance: 36.7 mL/min (by C-G formula based on SCr of 0.66 mg/dL).  Assessment:  84yo female admitted 10/22 with CP and dizziness x1wk, initial w/u in ED unremarkable, admitted for further evaluation.  Continues on Coumadin for atrial fibrillation.  INR 2.42, therapeutic. Hg 11.3, plt 175. No s/sx of bleeding. Received GI consult - will monitor work-up.   Home Coumadin regimen: 2.5 mg daily except 5 mg on Tuesdays and Thursdays.  Goal of Therapy:  INR 2-3 Monitor platelets by anticoagulation protocol: Yes   Plan:  Hold warfarin tonight for EDG possibly tomorrow Daily PT/INR Follow up GI consult/plans.  Doylene Canard, PharmD Clinical Pharmacist  Pager: [phone] Phone: [phone] 02/01/2018,8:59 AM

## 2018-02-01 NOTE — Progress Notes (Signed)
Patient ID: Brittany [name], female   DOB: Aug 17, 1927, 84 y.o.   MRN: [id number]  PROGRESS NOTE    Brittany [name]  SWN:[id number] DOB: 1927-08-28 DOA: 01/29/2018 PCP: Mayra Neer, MD   Brief Narrative: 84 year old female with history of atrial fibrillation on Coumadin, aortic valve repair, CAD, CKD stage III, hypertension and anxiety presented with chest pain.  Cardiology was consulted and cardiology thought that the chest pain was probably secondary to hiatal hernia.  Pain did not improve with conservative treatment.  GI was consulted.   Assessment & Plan:   Principal Problem:   Chest pain Active Problems:   Hypertension   Chronic atrial fibrillation (HCC)   Abdominal pain, epigastric   S/P AVR (aortic valve replacement)   Chronic diastolic heart failure (HCC)   Hyponatremia   CKD (chronic kidney disease), stage III (HCC)   H/O hiatal hernia   Chest pain/epigastric pain probably secondary to esophagitis/hiatal hernia -Still complains of considerable pain.  Continue Protonix, Pepcid.  GI following.  Planning for upper GI endoscopy at some point. -Seen by cardiology: Hold off on any further cardiac testing unless there is clear objective evidence pointing in the direction for further work-up.  Permanent atrial fibrillation -Rate controlled.  Will hold dose of Coumadin tonight in preparation for EGD.  Check INR  Hyponatremia -Probably from SIADH. -Continue fluid restriction.  Monitor sodium   DVT prophylaxis: Coumadin Code Status: DNR  family Communication: None at bedside Disposition Plan: Home once cleared by GI  Consultants: GI/cardiology Procedures: None Antimicrobials: None   Subjective: Patient seen and examined at bedside.  She still complains of moderate to severe abdominal pain.  No overnight fever or vomiting.  Objective: Vitals:   01/31/18 2100 01/31/18 2313 02/01/18 0500 02/01/18 1016  BP: (!) 174/90 (!) 146/92 97/83 (!) 151/80  Pulse: 68  (!) 58     Resp: 15  17   Temp: 98.1 F (36.7 C)  98 F (36.7 C)   TempSrc: Oral  Oral   SpO2: 97%  94%   Weight:   55.8 kg   Height:        Intake/Output Summary (Last 24 hours) at 02/01/2018 1147 Last data filed at 02/01/2018 9381 Gross per 24 hour  Intake 240 ml  Output 1100 ml  Net -860 ml   Filed Weights   01/30/18 0222 01/31/18 0435 02/01/18 0500  Weight: 54.5 kg 56.3 kg 55.8 kg    Examination:  General exam: Appears calm and comfortable  Respiratory system: Bilateral decreased breath sounds at bases Cardiovascular system: S1 & S2 heard, Rate controlled Gastrointestinal system: Abdomen is nondistended, soft and mildly tender in the epigastric region. Normal bowel sounds heard. Extremities: No cyanosis, clubbing, edema   Data Reviewed: I have personally reviewed following labs and imaging studies  CBC: Recent Labs  Lab 01/29/18 1545 02/01/18 0425  WBC 8.1 8.0  HGB 13.8 11.3*  HCT 41.8 34.0*  MCV 87.6 86.7  PLT 196 829   Basic Metabolic Panel: Recent Labs  Lab 01/29/18 1545 01/30/18 0814 01/31/18 0422 02/01/18 0425  NA 127* 127* 126* 126*  K 3.9 3.8 4.3 4.2  CL 89* 95* 92* 95*  CO2 25 24 27 25   GLUCOSE 104* 94 91 87  BUN 21 11 7* 8  CREATININE 1.11* 0.74 0.74 0.66  CALCIUM 9.2 8.7* 8.7* 8.4*   GFR: Estimated Creatinine Clearance: 36.7 mL/min (by C-G formula based on SCr of 0.66 mg/dL). Liver Function Tests: No results for input(s): AST, ALT,  ALKPHOS, BILITOT, PROT, ALBUMIN in the last 168 hours. No results for input(s): LIPASE, AMYLASE in the last 168 hours. No results for input(s): AMMONIA in the last 168 hours. Coagulation Profile: Recent Labs  Lab 01/30/18 0105 01/31/18 0422 02/01/18 0425  INR 2.08 2.27 2.42   Cardiac Enzymes: Recent Labs  Lab 01/30/18 0306 01/30/18 0814 01/30/18 1412  TROPONINI <0.03 <0.03 <0.03   BNP (last 3 results) No results for input(s): PROBNP in the last 8760 hours. HbA1C: No results for input(s): HGBA1C in the  last 72 hours. CBG: No results for input(s): GLUCAP in the last 168 hours. Lipid Profile: No results for input(s): CHOL, HDL, LDLCALC, TRIG, CHOLHDL, LDLDIRECT in the last 72 hours. Thyroid Function Tests: No results for input(s): TSH, T4TOTAL, FREET4, T3FREE, THYROIDAB in the last 72 hours. Anemia Panel: No results for input(s): VITAMINB12, FOLATE, FERRITIN, TIBC, IRON, RETICCTPCT in the last 72 hours. Sepsis Labs: No results for input(s): PROCALCITON, LATICACIDVEN in the last 168 hours.  No results found for this or any previous visit (from the past 240 hour(s)).       Radiology Studies: No results found.      Scheduled Meds: . aspirin  324 mg Oral Once  . aspirin EC  81 mg Oral Daily  . atorvastatin  20 mg Oral q1800  . busPIRone  15 mg Oral BID  . cycloSPORINE  1 drop Both Eyes BID  . diltiazem  120 mg Oral BID  . docusate sodium  100 mg Oral BID  . famotidine  20 mg Oral QHS  . lisinopril  40 mg Oral Daily  . multivitamin  2 tablet Oral BID  . oxybutynin  10 mg Oral QHS  . oxybutynin  5 mg Oral Daily  . pantoprazole  40 mg Oral BID  . polyvinyl alcohol  1 drop Both Eyes BID  . warfarin  5 mg Oral ONCE-1800  . Warfarin - Pharmacist Dosing Inpatient   Does not apply q1800   Continuous Infusions:   LOS: 1 day        Aline August, MD [facility] Pager [phone]  If 7PM-7AM, please contact night-coverage [email or web address] Password [email or web address] 02/01/2018, 11:47 AM

## 2018-02-01 NOTE — Interval H&P Note (Signed)
History and Physical Interval Note:  02/01/2018 2:19 PM  Brittany [name]  has presented today for surgery, with the diagnosis of Epigastric pain  The various methods of treatment have been discussed with the patient and family. After consideration of risks, benefits and other options for treatment, the patient has consented to  Procedure(s): ESOPHAGOGASTRODUODENOSCOPY (EGD) WITH PROPOFOL (N/A) as a surgical intervention .  The patient's history has been reviewed, patient examined, no change in status, stable for surgery.  I have reviewed the patient's chart and labs.  Questions were answered to the patient's satisfaction.     The risks and benefits of endoscopic evaluation were discussed with the patient; these include but are not limited to the risk of perforation, infection, bleeding, missed lesions, lack of diagnosis, severe illness requiring hospitalization, as well as anesthesia and sedation related illnesses.  The patient is agreeable to proceed.  Slightly increased risk of bleeding in setting of coumadin and INR use but will proceed with diagnostic evaluation.  She and caregiver agree to this plan of action.   [employer]

## 2018-02-01 NOTE — Op Note (Signed)
[facility] Patient Name: Brittany [name] Procedure Date : 02/01/2018 MRN: [id number] Attending MD: Justice Britain , MD Date of Birth: 07-12-1927 CSN: [id number] Age: 84 Admit Type: Inpatient Procedure:                Upper GI endoscopy Indications:              Epigastric abdominal pain Providers:                Justice Britain, MD, Carlyn Reichert, RN, Elspeth Cho Tech., Technician Referring MD:             Carlota Raspberry. Armbruster, MD Medicines:                Fentanyl 75 micrograms IV, Midazolam 5 mg IV,                            Diphenhydramine 25 mg IV Complications:            No immediate complications. Estimated Blood Loss:     Estimated blood loss was minimal. Procedure:                Pre-Anesthesia Assessment:                           - Prior to the procedure, a History and Physical                            was performed, and patient medications and                            allergies were reviewed. The patient's tolerance of                            previous anesthesia was also reviewed. The risks                            and benefits of the procedure and the sedation                            options and risks were discussed with the patient.                            All questions were answered, and informed consent                            was obtained. Prior Anticoagulants: The patient has                            taken Coumadin (warfarin). ASA Grade Assessment:                            III - A patient with severe systemic disease. After  reviewing the risks and benefits, the patient was                            deemed in satisfactory condition to undergo the                            procedure.                           After obtaining informed consent, the endoscope was                            passed under direct vision. Throughout the                            procedure,  the patient's blood pressure, pulse, and                            oxygen saturations were monitored continuously. The                            [id number]-[id number] ([id number]) Olympus Adult EGD was introduced                            through the mouth, and advanced to the second part                            of duodenum. The upper GI endoscopy was                            accomplished without difficulty. The patient                            tolerated the procedure. Scope In: Scope Out: Findings:      White nummular lesions were noted in the proximal esophagus and in the       mid esophagus. Cells for cytology were obtained by brushing to rule out       Candida.      Food was found in the distal esophagus. Removal of food was accomplished       with use of a Roth net and multiple intubations to remove the pieces.Marland Kitchen      LA Grade C (one or more mucosal breaks continuous between tops of 2 or       more mucosal folds, less than 75% circumference) esophagitis with no       bleeding was found in the distal esophagus - this was in region where       foodstuffs had been hanging up.      Likely grade I, small (< 5 mm) varices were found in the distal       esophagus.      One non-bleeding superficial gastric ulcer with a clean ulcer base       (Forrest Class III) was found in the gastric fundus. The lesion was 7 mm       in largest dimension.      A few dispersed, small non-bleeding erosions were found  in the gastric       body and in the gastric antrum. There were no stigmata of recent       bleeding.      No other gross lesions were noted in the entire examined stomach.       Biopsies were taken with a cold forceps for histology and Helicobacter       pylori testing.      No gross lesions were noted in the duodenal bulb, in the first portion       of the duodenum and in the second portion of the duodenum. Impression:               - White nummular lesions in esophageal mucosa.                             Cells for cytology obtained to rule out Candida.                           - Food in the distal esophagus. Removal was                            successful.                           - LA Grade C esophagitis in region of foodstuffs.                           - Grade I and small (< 5 mm) esophageal varices.                           - Non-bleeding gastric ulcer with a clean ulcer                            base (Forrest Class III). Non-bleeding erosive                            gastropathy. No other gross lesions in the stomach.                            Biopsied for HP.                           - No gross lesions in the duodenal bulb, in the                            first portion of the duodenum and in the second                            portion of the duodenum. Recommendation:           - The patient will be observed post-procedure,                            until all discharge criteria are met.                           -  Return patient to hospital ward for ongoing care.                           - Esophagitis is likely more of a result of the                            foodstuffs in the esophagus being stuck in distal                            esophagus for a time period.                           - Await pathology results.                           - Transition patient to Nexium 40 mg BID.                           - Clear liquid diet for 24 hours then advance to                            full liquid diet for 48 hours and then soft-diet                            thereafter.                           - Would obtain a Liver Doppler U/S.                           - Further workup will be dictated as an outpatient                            in regards to possible dysphagia, although patient                            did not have this type of symptomatology upon                            questioning prior to her procedure.                           - The findings  and recommendations were discussed                            with the patient.                           - The findings and recommendations were discussed                            with the designated responsible adult.                           -  The findings and recommendations were discussed                            with the referring physician. Procedure Code(s):        --- Professional ---                           [phone], Esophagogastroduodenoscopy, flexible,                            transoral; with removal of foreign body(s)                           43239, Esophagogastroduodenoscopy, flexible,                            transoral; with biopsy, single or multiple Diagnosis Code(s):        --- Professional ---                           K22.8, Other specified diseases of esophagus                           T18.128A, Food in esophagus causing other injury,                            initial encounter                           K20.9, Esophagitis, unspecified                           I85.00, Esophageal varices without bleeding                           K25.9, Gastric ulcer, unspecified as acute or                            chronic, without hemorrhage or perforation                           K31.89, Other diseases of stomach and duodenum                           R10.13, Epigastric pain CPT copyright 2018 American Medical Association. All rights reserved. The codes documented in this report are preliminary and upon coder review may  be revised to meet current compliance requirements. Justice Britain, MD 02/01/2018 5:23:11 PM Number of Addenda: 0

## 2018-02-02 ENCOUNTER — Encounter (HOSPITAL_COMMUNITY): Payer: Self-pay | Admitting: Gastroenterology

## 2018-02-02 DIAGNOSIS — N183 Chronic kidney disease, stage 3 (moderate): Secondary | ICD-10-CM

## 2018-02-02 LAB — CBC WITH DIFFERENTIAL/PLATELET
ABS IMMATURE GRANULOCYTES: 0.02 10*3/uL (ref 0.00–0.07)
BASOS ABS: 0.1 10*3/uL (ref 0.0–0.1)
Basophils Relative: 1 %
Eosinophils Absolute: 0.2 10*3/uL (ref 0.0–0.5)
Eosinophils Relative: 3 %
HCT: 33.4 % — ABNORMAL LOW (ref 36.0–46.0)
Hemoglobin: 10.9 g/dL — ABNORMAL LOW (ref 12.0–15.0)
IMMATURE GRANULOCYTES: 0 %
LYMPHS ABS: 1.5 10*3/uL (ref 0.7–4.0)
LYMPHS PCT: 21 %
MCH: 28.8 pg (ref 26.0–34.0)
MCHC: 32.6 g/dL (ref 30.0–36.0)
MCV: 88.1 fL (ref 80.0–100.0)
MONO ABS: 1.1 10*3/uL — AB (ref 0.1–1.0)
MONOS PCT: 16 %
Neutro Abs: 4.2 10*3/uL (ref 1.7–7.7)
Neutrophils Relative %: 59 %
PLATELETS: 179 10*3/uL (ref 150–400)
RBC: 3.79 MIL/uL — AB (ref 3.87–5.11)
RDW: 14.4 % (ref 11.5–15.5)
WBC: 7.1 10*3/uL (ref 4.0–10.5)
nRBC: 0 % (ref 0.0–0.2)

## 2018-02-02 LAB — BASIC METABOLIC PANEL
ANION GAP: 6 (ref 5–15)
BUN: 8 mg/dL (ref 8–23)
CALCIUM: 8.4 mg/dL — AB (ref 8.9–10.3)
CO2: 28 mmol/L (ref 22–32)
Chloride: 100 mmol/L (ref 98–111)
Creatinine, Ser: 0.88 mg/dL (ref 0.44–1.00)
GFR calc Af Amer: >60 mL/min (ref >60)
GFR, EST NON AFRICAN AMERICAN: 57 mL/min — AB (ref >60)
Glucose, Bld: 86 mg/dL (ref 70–99)
POTASSIUM: 4.5 mmol/L (ref 3.5–5.1)
SODIUM: 134 mmol/L — AB (ref 135–145)

## 2018-02-02 LAB — PROTIME-INR
INR: 1.89
Prothrombin Time: 21.4 seconds — ABNORMAL HIGH (ref 11.4–15.2)

## 2018-02-02 LAB — MAGNESIUM: MAGNESIUM: 1.8 mg/dL (ref 1.7–2.4)

## 2018-02-02 MED ORDER — ESOMEPRAZOLE MAGNESIUM 40 MG PO CPDR: 40.0000 mg | DELAYED_RELEASE_CAPSULE | Freq: Two times a day (BID) | ORAL | 0 refills | Status: DC

## 2018-02-02 MED ORDER — NITROGLYCERIN 0.4 MG SL SUBL: 0.4000 mg | SUBLINGUAL_TABLET | SUBLINGUAL | 0 refills | Status: AC | PRN

## 2018-02-02 MED ORDER — HYDROCODONE-ACETAMINOPHEN 5-325 MG PO TABS: 1.0000 | ORAL_TABLET | Freq: Four times a day (QID) | ORAL | 0 refills | Status: AC | PRN

## 2018-02-02 MED ORDER — WARFARIN SODIUM 5 MG PO TABS: 2.5000 mg | ORAL_TABLET | ORAL | Status: DC

## 2018-02-02 MED ORDER — POLYETHYLENE GLYCOL 3350 17 G PO PACK: 17.0000 g | PACK | Freq: Every day | ORAL | 0 refills | Status: DC | PRN

## 2018-02-02 MED ORDER — WARFARIN - PHARMACIST DOSING INPATIENT: Freq: Every day | Status: DC

## 2018-02-02 MED ORDER — WARFARIN SODIUM 5 MG PO TABS: 5.0000 mg | ORAL_TABLET | Freq: Once | ORAL | Status: DC

## 2018-02-02 NOTE — Care Management Important Message (Signed)
Important Message  Patient Details  Name: Brittany [name] MRN: [id number] Date of Birth: 04-05-1928   Medicare Important Message Given:  Yes    [name] 02/02/2018, 11:44 AM

## 2018-02-02 NOTE — Care Management Note (Signed)
Case Management Note  Patient Details  Name: Brittany [name] MRN: [id number] Date of Birth: 1928-04-09  Subjective/Objective: Pt presented for Chest pain and increased Blood pressure. Pt is a home with a caregiver. CM did speak with patient earlier in the week and just stopped back by to see if pt will need anything for home.                    Action/Plan: No home needs identified at this time. Caregiver at the bedside and she will provide transportation home.   Expected Discharge Date:  02/02/18               Expected Discharge Plan:  Home/Self Care  In-House Referral:  NA  Discharge planning Services  CM Consult  Post Acute Care Choice:  NA Choice offered to:  NA  DME Arranged:  N/A DME Agency:  NA  HH Arranged:  NA HH Agency:  NA  Status of Service:  Completed, signed off  If discussed at [place] of Stay Meetings, dates discussed:    Additional Comments:  Bethena Roys, RN 02/02/2018, 3:34 PM

## 2018-02-02 NOTE — Progress Notes (Signed)
Discharge instructions (including medications) discussed with and copy provided to patient/caregiver 

## 2018-02-02 NOTE — Progress Notes (Signed)
ANTICOAGULATION CONSULT NOTE - Follow Up Consult  Pharmacy Consult for Coumadin Indication: atrial fibrillation  Allergies  Allergen Reactions  . Amlodipine Swelling and Other (See Comments)     Unspecified swelling reaction  . Amoxicillin Diarrhea, Nausea And Vomiting and Other (See Comments)    Has patient had a PCN reaction causing immediate rash, facial/tongue/throat swelling, SOB or lightheadedness with hypotension: No Has patient had a PCN reaction causing severe rash involving mucus membranes or skin necrosis: No Has patient had a PCN reaction that required hospitalization: No Has patient had a PCN reaction occurring within the last 10 years: No If all of the above answers are "NO", then may proceed with Cephalosporin use.  . Carafate [Sucralfate] Swelling and Other (See Comments)    Reaction:  Knee swelling/redness   . Cephalexin Diarrhea  . Codeine Nausea And Vomiting  . Irbesartan Swelling and Other (See Comments)    Reaction:  Facial/hand swelling and numbness   . Morphine And Related Other (See Comments)    Pt states that she has a history of addiction with this medication.      . Neurontin [Gabapentin] Swelling and Other (See Comments)    Reaction:  Leg swelling   . Nitrofurantoin Monohyd Macro Diarrhea and Nausea And Vomiting  . Prednisone Other (See Comments)    Reaction:  Elevated BP  . Sulfa Antibiotics Rash    Patient Measurements: Height: 5\' 1"  (154.9 cm) Weight: 121 lb 4.1 oz (55 kg) IBW/kg (Calculated) : 47.8  Vital Signs: Temp: 98.4 F (36.9 C) (10/25 0600) Temp Source: Oral (10/25 0600) BP: 134/78 (10/25 0958) Pulse Rate: 58 (10/25 0600)  Labs: Recent Labs    01/30/18 1412 01/31/18 0422 02/01/18 0425 02/02/18 0458  HGB  --   --  11.3* 10.9*  HCT  --   --  34.0* 33.4*  PLT  --   --  175 179  LABPROT  --  24.7* 26.0* 21.4*  INR  --  2.27 2.42 1.89  CREATININE  --  0.74 0.66 0.88  TROPONINI <0.03  --   --   --     Estimated Creatinine  Clearance: 33.3 mL/min (by C-G formula based on SCr of 0.88 mg/dL).  Assessment: Anticoag: Coumadin for Afib (also hx tissues AVR). INR down to 1.89. Hgb 10.9  -PTA: Coumadin regimen of 2.5 mg MWFSS and 5mg  T/Th; LD 2.5mg  on 10/22  10/25: EGD with esophagitis. No bleeding. Nexium 40mg  BID recommended   Goal of Therapy:  INR 2-3 Monitor platelets by anticoagulation protocol: Yes   Plan:  Coumadin 5mg  po x 1 tonight Daily PT/INR   Edit Ricciardelli S. Alford Highland, PharmD, BCPS Clinical Staff Pharmacist Brittany [name] 02/02/2018,11:14 AM

## 2018-02-02 NOTE — Discharge Summary (Addendum)
Physician Discharge Summary  Brittany [name] VPX:[id number] DOB: 1928-02-24 DOA: 01/29/2018  PCP: Mayra Neer, MD  Admit date: 01/29/2018 Discharge date: 02/02/2018  Admitted From: Home Disposition: Home  Recommendations for Outpatient Follow-up:  1. Follow up with PCP in 1 week with repeat CBC/BMP/INR  2. Follow-up with gastroenterology as an outpatient 3. Outpatient follow-up with cardiology 4. Follow-up in the ED if symptoms worsen or new appear   Home Health: No Equipment/Devices: None  Discharge Condition: Stable CODE STATUS: DNR Diet recommendation: Heart Healthy /clear liquid diet till 02/02/2018, full liquid diet from 02/03/2018 for 2 days and then soft diet for a few days as per GI recommendations  Brief/Interim Summary: 84 year old female with history of atrial fibrillation on Coumadin, aortic valve repair, CAD, CKD stage III, hypertension and anxiety presented with chest pain.  Cardiology was consulted and cardiology thought that the chest pain was probably secondary to hiatal hernia.  Pain did not improve with conservative treatment.  GI was consulted.  Patient had EGD on 02/01/2018.  Symptoms have improved.  GI has cleared the patient for discharge.  Discharge Diagnoses:  Principal Problem:   Chest pain Active Problems:   Hypertension   Chronic atrial fibrillation (HCC)   Abdominal pain, epigastric   S/P AVR (aortic valve replacement)   Chronic diastolic heart failure (HCC)   Hyponatremia   CKD (chronic kidney disease), stage III (HCC)   H/O hiatal hernia  Chest pain/epigastric pain probably secondary to esophageal food bolus, esophagitis and gastric ulcer -Patient had EGD on 02/01/2018 which showed esophageal food bolus which was removed, esophagitis and gastric ulcer.  GI recommends PPI/Nexium twice a day.  Biopsy results can be followed up with GI as an outpatient.  GI has cleared the patient for discharge.  Patient's pain has significantly improved.   Patient to continue clear liquid diet until 02/02/2018, full liquid diet from 02/03/2018 for 2 days then soft diet for a few days afterwards as per GI recommendations.  Outpatient follow-up with GI. -Seen by cardiology: Hold off on any further cardiac testing unless there is clear objective evidence pointing in the direction for further work-up.  Outpatient follow-up with cardiology  Permanent atrial fibrillation -Rate controlled.    Resume outpatient Coumadin regimen.  Follow INR as an outpatient  Hyponatremia -Probably from SIADH. -Improved.  Outpatient follow-up  Discharge Instructions  Discharge Instructions    Ambulatory referral to Gastroenterology   Complete by:  As directed    Follow-up   Call MD for:  difficulty breathing, headache or visual disturbances   Complete by:  As directed    Call MD for:  extreme fatigue   Complete by:  As directed    Call MD for:  hives   Complete by:  As directed    Call MD for:  persistant dizziness or light-headedness   Complete by:  As directed    Call MD for:  persistant nausea and vomiting   Complete by:  As directed    Call MD for:  severe uncontrolled pain   Complete by:  As directed    Call MD for:  temperature >100.4   Complete by:  As directed    Diet - low sodium heart healthy   Complete by:  As directed    Discharge instructions   Complete by:  As directed    Clear liquid diet for 02/02/2018.  Full liquid diet from 02/03/2018 for 2 days.  Soft diet for a few days afterwards as per GI recommendations.  Increase activity slowly   Complete by:  As directed      Allergies as of 02/02/2018      Reactions   Amlodipine Swelling, Other (See Comments)    Unspecified swelling reaction   Amoxicillin Diarrhea, Nausea And Vomiting, Other (See Comments)   Has patient had a PCN reaction causing immediate rash, facial/tongue/throat swelling, SOB or lightheadedness with hypotension: No Has patient had a PCN reaction causing severe rash  involving mucus membranes or skin necrosis: No Has patient had a PCN reaction that required hospitalization: No Has patient had a PCN reaction occurring within the last 10 years: No If all of the above answers are "NO", then may proceed with Cephalosporin use.   Carafate [sucralfate] Swelling, Other (See Comments)   Reaction:  Knee swelling/redness    Cephalexin Diarrhea   Codeine Nausea And Vomiting   Irbesartan Swelling, Other (See Comments)   Reaction:  Facial/hand swelling and numbness    Morphine And Related Other (See Comments)   Pt states that she has a history of addiction with this medication.       Neurontin [gabapentin] Swelling, Other (See Comments)   Reaction:  Leg swelling    Nitrofurantoin Monohyd Macro Diarrhea, Nausea And Vomiting   Prednisone Other (See Comments)   Reaction:  Elevated BP   Sulfa Antibiotics Rash      Medication List    STOP taking these medications   pantoprazole 40 MG tablet Commonly known as:  PROTONIX     TAKE these medications   atorvastatin 20 MG tablet Commonly known as:  LIPITOR Take 20 mg by mouth daily.   busPIRone 15 MG tablet Commonly known as:  BUSPAR TAKE ONE TABLET BY MOUTH TWICE A DAY   cycloSPORINE 0.05 % ophthalmic emulsion Commonly known as:  RESTASIS Place 1 drop into both eyes 2 (two) times daily.   diazepam 5 MG tablet Commonly known as:  VALIUM Take 5 mg by mouth at bedtime as needed (sleep).   diltiazem 120 MG tablet Commonly known as:  CARDIZEM Take 120 mg by mouth 2 (two) times daily.   esomeprazole 40 MG capsule Commonly known as:  NEXIUM Take 1 capsule (40 mg total) by mouth 2 (two) times daily before a meal.   HYDROcodone-acetaminophen 5-325 MG tablet Commonly known as:  NORCO/VICODIN Take 1 tablet by mouth every 6 (six) hours as needed for severe pain. What changed:  reasons to take this   hyoscyamine 0.125 MG tablet Commonly known as:  LEVSIN, ANASPAZ Take 0.125 mg by mouth every 4 (four) hours  as needed for cramping.   ICAPS AREDS 2 Caps Take 1 capsule by mouth 2 (two) times daily.   ipratropium 0.06 % nasal spray Commonly known as:  ATROVENT Place 2 sprays into both nostrils 2 (two) times daily.   lisinopril 40 MG tablet Commonly known as:  PRINIVIL,ZESTRIL Take 40 mg by mouth daily.   nitroGLYCERIN 0.4 MG SL tablet Commonly known as:  NITROSTAT Place 1 tablet (0.4 mg total) under the tongue every 5 (five) minutes as needed for chest pain.   ondansetron 4 MG tablet Commonly known as:  ZOFRAN Take 1 tablet (4 mg total) by mouth every 8 (eight) hours as needed for nausea.   oxybutynin 5 MG tablet Commonly known as:  DITROPAN Take 5-10 mg by mouth See admin instructions. Take 1 tablet (5 mg) by mouth every morning and 2 tablets (10 mg) at night   polyethylene glycol packet Commonly known as:  MIRALAX /  GLYCOLAX Take 17 g by mouth daily as needed for moderate constipation.   potassium chloride SA 20 MEQ tablet Commonly known as:  K-DUR,KLOR-CON Take 20 mEq by mouth daily as needed (ONLY WHEN TAKING TORSEMIDE).   promethazine 12.5 MG tablet Commonly known as:  PHENERGAN Take 1 tablet (12.5 mg total) by mouth every 6 (six) hours as needed for nausea or vomiting.   ranitidine 150 MG tablet Commonly known as:  ZANTAC Take 150 mg by mouth at bedtime.   REFRESH OP Place 1 drop into both eyes 2 (two) times daily.   torsemide 20 MG tablet Commonly known as:  DEMADEX Take 20 mg by mouth daily.   warfarin 5 MG tablet Commonly known as:  COUMADIN Take as directed. If you are unsure how to take this medication, talk to your nurse or doctor. Original instructions:  Take 0.5-1 tablets (2.5-5 mg total) by mouth See admin instructions. Take 1 tablet on Tuesday and Thursday then take 1/2 tablet all the other days What changed:  See the new instructions.       Follow-up Information    Mayra Neer, MD. Schedule an appointment as soon as possible for a visit in 1  week(s).   Specialty:  Family Medicine Contact information: [address] [place] [address] [phone]        Nahser, Wonda Cheng, MD .   Specialty:  Cardiology Contact information: [place] [address] [phone]        Mansouraty, Telford Nab., MD Follow up in 2 month(s).   Specialties:  Gastroenterology, Internal Medicine Contact information: [place] [address] [phone]          Allergies  Allergen Reactions  . Amlodipine Swelling and Other (See Comments)     Unspecified swelling reaction  . Amoxicillin Diarrhea, Nausea And Vomiting and Other (See Comments)    Has patient had a PCN reaction causing immediate rash, facial/tongue/throat swelling, SOB or lightheadedness with hypotension: No Has patient had a PCN reaction causing severe rash involving mucus membranes or skin necrosis: No Has patient had a PCN reaction that required hospitalization: No Has patient had a PCN reaction occurring within the last 10 years: No If all of the above answers are "NO", then may proceed with Cephalosporin use.  . Carafate [Sucralfate] Swelling and Other (See Comments)    Reaction:  Knee swelling/redness   . Cephalexin Diarrhea  . Codeine Nausea And Vomiting  . Irbesartan Swelling and Other (See Comments)    Reaction:  Facial/hand swelling and numbness   . Morphine And Related Other (See Comments)    Pt states that she has a history of addiction with this medication.      . Neurontin [Gabapentin] Swelling and Other (See Comments)    Reaction:  Leg swelling   . Nitrofurantoin Monohyd Macro Diarrhea and Nausea And Vomiting  . Prednisone Other (See Comments)    Reaction:  Elevated BP  . Sulfa Antibiotics Rash    Consultations:  GI/cardiology   Procedures/Studies: Dg Chest 2 View  Result Date: 01/29/2018 CLINICAL DATA:  Chest pain EXAM: CHEST - 2 VIEW COMPARISON:  08/25/2017 FINDINGS: Post sternotomy  changes with valve prosthesis. Dense mitral calcification. Mild cardiomegaly. No pleural effusion or focal opacity. Aortic atherosclerosis. No pneumothorax. IMPRESSION: No active cardiopulmonary disease.  Mild cardiomegaly. Electronically Signed   By: Donavan Foil M.D.   On: 01/29/2018 16:55    EGD on 02/01/2018 Findings:  White nummular lesions were noted in the proximal esophagus and in the       mid esophagus. Cells for cytology were obtained by brushing to rule out       Candida.      Food was found in the distal esophagus. Removal of food was accomplished       with use of a Roth net and multiple intubations to remove the pieces.Marland Kitchen      LA Grade C (one or more mucosal breaks continuous between tops of 2 or       more mucosal folds, less than 75% circumference) esophagitis with no       bleeding was found in the distal esophagus - this was in region where       foodstuffs had been hanging up.      Likely grade I, small (< 5 mm) varices were found in the distal       esophagus.      One non-bleeding superficial gastric ulcer with a clean ulcer base       (Forrest Class III) was found in the gastric fundus. The lesion was 7 mm       in largest dimension.      A few dispersed, small non-bleeding erosions were found in the gastric       body and in the gastric antrum. There were no stigmata of recent       bleeding.      No other gross lesions were noted in the entire examined stomach.       Biopsies were taken with a cold forceps for histology and Helicobacter       pylori testing.      No gross lesions were noted in the duodenal bulb, in the first portion       of the duodenum and in the second portion of the duodenum. Impression:               - White nummular lesions in esophageal mucosa.                            Cells for cytology obtained to rule out Candida.                           - Food in the distal esophagus. Removal was                            successful.                            - LA Grade C esophagitis in region of foodstuffs.                           - Grade I and small (< 5 mm) esophageal varices.                           - Non-bleeding gastric ulcer with a clean ulcer                            base (Forrest Class III). Non-bleeding erosive  gastropathy. No other gross lesions in the stomach.                            Biopsied for HP.                           - No gross lesions in the duodenal bulb, in the                            first portion of the duodenum and in the second                            portion of the duodenum. Recommendation:           - The patient will be observed post-procedure,                            until all discharge criteria are met.                           - Return patient to hospital ward for ongoing care.                           - Esophagitis is likely more of a result of the                            foodstuffs in the esophagus being stuck in distal                            esophagus for a time period.                           - Await pathology results.                           - Transition patient to Nexium 40 mg BID.                           - Clear liquid diet for 24 hours then advance to                            full liquid diet for 48 hours and then soft-diet                            thereafter.                           - Would obtain a Liver Doppler U/S.                           - Further workup will be dictated as an outpatient                            in regards to possible dysphagia, although patient  did not have this type of symptomatology upon                            questioning prior to her procedure.                           - The findings and recommendations were discussed                            with the patient.                           - The findings and recommendations were discussed                             with the designated responsible adult.                           - The findings and recommendations were discussed                            with the referring physician.   Subjective: Patient seen and examined at bedside.  She feels much better and her abdominal pain is much better controlled with pain medications.  No overnight fever, nausea or vomiting.  She is tolerating her clear liquid diet.  Discharge Exam: Vitals:   02/02/18 0958 02/02/18 1323  BP: 134/78 (!) 124/51  Pulse:  (!) 57  Resp:    Temp:  98.2 F (36.8 C)  SpO2:  97%   Vitals:   02/01/18 2024 02/02/18 0600 02/02/18 0958 02/02/18 1323  BP: 128/76 101/61 134/78 (!) 124/51  Pulse: 65 (!) 58  (!) 57  Resp: 14 (!) 24    Temp: 97.6 F (36.4 C) 98.4 F (36.9 C)  98.2 F (36.8 C)  TempSrc: Oral Oral  Oral  SpO2: 95% 97%  97%  Weight:  55 kg    Height:        General: Pt is alert, awake, not in acute distress Cardiovascular: Intermittent bradycardia, S1/S2 + Respiratory: bilateral decreased breath sounds at bases Abdominal: Soft, mild epigastric tenderness, ND, bowel sounds + Extremities: no edema, no cyanosis    The results of significant diagnostics from this hospitalization (including imaging, microbiology, ancillary and laboratory) are listed below for reference.     Microbiology: No results found for this or any previous visit (from the past 240 hour(s)).   Labs: BNP (last 3 results) No results for input(s): BNP in the last 8760 hours. Basic Metabolic Panel: Recent Labs  Lab 01/29/18 1545 01/30/18 0814 01/31/18 0422 02/01/18 0425 02/02/18 0458  NA 127* 127* 126* 126* 134*  K 3.9 3.8 4.3 4.2 4.5  CL 89* 95* 92* 95* 100  CO2 '25 24 27 25 28  '$ GLUCOSE 104* 94 91 87 86  BUN 21 11 7* 8 8  CREATININE 1.11* 0.74 0.74 0.66 0.88  CALCIUM 9.2 8.7* 8.7* 8.4* 8.4*  MG  --   --   --   --  1.8   Liver Function Tests: No results for input(s): AST, ALT, ALKPHOS, BILITOT, PROT, ALBUMIN in the last  168 hours. No results for input(s): LIPASE, AMYLASE in the last 168 hours. No  results for input(s): AMMONIA in the last 168 hours. CBC: Recent Labs  Lab 01/29/18 1545 02/01/18 0425 02/02/18 0458  WBC 8.1 8.0 7.1  NEUTROABS  --   --  4.2  HGB 13.8 11.3* 10.9*  HCT 41.8 34.0* 33.4*  MCV 87.6 86.7 88.1  PLT 196 175 179   Cardiac Enzymes: Recent Labs  Lab 01/30/18 0306 01/30/18 0814 01/30/18 1412  TROPONINI <0.03 <0.03 <0.03   BNP: Invalid input(s): POCBNP CBG: No results for input(s): GLUCAP in the last 168 hours. D-Dimer No results for input(s): DDIMER in the last 72 hours. Hgb A1c No results for input(s): HGBA1C in the last 72 hours. Lipid Profile No results for input(s): CHOL, HDL, LDLCALC, TRIG, CHOLHDL, LDLDIRECT in the last 72 hours. Thyroid function studies No results for input(s): TSH, T4TOTAL, T3FREE, THYROIDAB in the last 72 hours.  Invalid input(s): FREET3 Anemia work up No results for input(s): VITAMINB12, FOLATE, FERRITIN, TIBC, IRON, RETICCTPCT in the last 72 hours. Urinalysis    Component Value Date/Time   COLORURINE STRAW (A) 09/29/2016 1529   APPEARANCEUR CLEAR 09/29/2016 1529   LABSPEC 1.003 (L) 09/29/2016 1529   PHURINE 7.0 09/29/2016 1529   GLUCOSEU NEGATIVE 09/29/2016 1529   GLUCOSEU NEGATIVE 06/25/2014 1201   HGBUR MODERATE (A) 09/29/2016 1529   BILIRUBINUR NEGATIVE 09/29/2016 1529   KETONESUR NEGATIVE 09/29/2016 1529   PROTEINUR NEGATIVE 09/29/2016 1529   UROBILINOGEN 0.2 08/27/2014 1954   NITRITE NEGATIVE 09/29/2016 1529   LEUKOCYTESUR NEGATIVE 09/29/2016 1529   Sepsis Labs Invalid input(s): PROCALCITONIN,  WBC,  LACTICIDVEN Microbiology No results found for this or any previous visit (from the past 240 hour(s)).   Time coordinating discharge: 35 minutes  SIGNED:   Aline August, MD  [facility] 02/02/2018, 2:04 PM Pager: [phone]  If 7PM-7AM, please contact night-coverage [email or web address] Password [email or web address]

## 2018-02-02 NOTE — Consult Note (Signed)
[facility] CM Primary Care Navigator  02/02/2018  DAKARI STABLER 1927/05/03 [id number]   Met with patient and caregiver Brien Mates) at the bedsideto identify possible discharge needs. Patient states that she had "chest pain and extreme pressure to chest area" that resulted to this admission. (chest pain/ epigastric pain probably secondary to esophageal food bolus, esophagitis and gastric ulcer)  Patientendorses Dr. Mayra Neer with [place] [facility] provider.    [facility] anydifficulty.  Patientreports thatshehas beenmanagingher own medications at home straight out of the containers, but she is aware to use "pill box" if starts having trouble taking/ managing medications.  Patientverbalized that her caregiver has been driving and providing transportationtoherdoctors' appointments.  Patientlivesathomewith son Konrad Dolores) and his wife Jacqlyn Larsen) who are supportive of her care. Patient has a private pay caregiver who comes Mon- Fri 5 hours a day who assists with her needs. Her son and wife assists her during weekends. Patient mentioned having a "life alert" device that she uses.  Anticipateddischargeplan ishome with caregiver per patient.   Patientand caregiver voiced understanding to call primary care provider's office whenshegetsback home, for a post discharge follow-up within1- 2weeksor sooner if needs arise. Patient letter (with PCP's contact number) was provided as theirreminder.   Explained topatientand caregiver aboutTHN CM services available for health management andresourcesat home but she verbalized that she is still capable and is well-informed of ways in managing HF/ HTN and caregiver has been assisting herwith health needsas well. Patient denies having COPD nor having any problems related to it. Patient and  caregiver expressed understandingto seekreferral to [facility] care management from primary care providerif deemed necessary and appropriate foranyservicesin thenear future.  [facility] care management information was provided for future needs that patient may have.  Primary care provider's office is listed as providing transition of care (TOC) follow-up.   For additional questions please contact:  Edwena Felty A. Briany Aye, BSN, RN-BC [facility] PRIMARY CARE Navigator Cell: [phone]

## 2018-02-06 ENCOUNTER — Telehealth: Payer: Self-pay | Admitting: Gastroenterology

## 2018-02-06 MED ORDER — ONDANSETRON HCL 8 MG PO TABS: 8.0000 mg | ORAL_TABLET | Freq: Three times a day (TID) | ORAL | 1 refills | Status: DC | PRN

## 2018-02-06 NOTE — Telephone Encounter (Signed)
Script sent to [facility] 

## 2018-02-06 NOTE — Telephone Encounter (Signed)
Pt already has a f/u appt with Dr. Havery Moros on 02/22/18.

## 2018-02-06 NOTE — Telephone Encounter (Signed)
Yes that's fine, thanks

## 2018-02-06 NOTE — Telephone Encounter (Signed)
Caregiver calling requesting zofran 8mg  [facility].

## 2018-02-06 NOTE — Telephone Encounter (Signed)
Dr. Havery Moros, [place] to refill Zofran 8mg ? Pt had EGD with Mansouraty on 10-24 at [facility].

## 2018-02-07 ENCOUNTER — Other Ambulatory Visit: Payer: Self-pay

## 2018-02-07 ENCOUNTER — Ambulatory Visit: Payer: Medicare Other | Admitting: Neurology

## 2018-02-07 DIAGNOSIS — K259 Gastric ulcer, unspecified as acute or chronic, without hemorrhage or perforation: Secondary | ICD-10-CM | POA: Diagnosis not present

## 2018-02-07 DIAGNOSIS — K2970 Gastritis, unspecified, without bleeding: Secondary | ICD-10-CM | POA: Diagnosis not present

## 2018-02-07 DIAGNOSIS — L97529 Non-pressure chronic ulcer of other part of left foot with unspecified severity: Secondary | ICD-10-CM | POA: Diagnosis not present

## 2018-02-07 DIAGNOSIS — I130 Hypertensive heart and chronic kidney disease with heart failure and stage 1 through stage 4 chronic kidney disease, or unspecified chronic kidney disease: Secondary | ICD-10-CM | POA: Diagnosis not present

## 2018-02-07 DIAGNOSIS — E871 Hypo-osmolality and hyponatremia: Secondary | ICD-10-CM | POA: Diagnosis not present

## 2018-02-07 DIAGNOSIS — B379 Candidiasis, unspecified: Secondary | ICD-10-CM | POA: Diagnosis not present

## 2018-02-07 DIAGNOSIS — D649 Anemia, unspecified: Secondary | ICD-10-CM | POA: Diagnosis not present

## 2018-02-07 DIAGNOSIS — T18128D Food in esophagus causing other injury, subsequent encounter: Secondary | ICD-10-CM | POA: Diagnosis not present

## 2018-02-07 DIAGNOSIS — I2510 Atherosclerotic heart disease of native coronary artery without angina pectoris: Secondary | ICD-10-CM | POA: Diagnosis not present

## 2018-02-07 DIAGNOSIS — B370 Candidal stomatitis: Secondary | ICD-10-CM

## 2018-02-07 MED ORDER — FLUCONAZOLE 100 MG PO TABS: 100.0000 mg | ORAL_TABLET | Freq: Every day | ORAL | 1 refills | Status: DC

## 2018-02-07 NOTE — Telephone Encounter (Signed)
Called patient to follow-up post hospitalization. Left message for patient to call back

## 2018-02-08 ENCOUNTER — Telehealth: Payer: Self-pay | Admitting: Cardiovascular Disease

## 2018-02-08 ENCOUNTER — Ambulatory Visit (INDEPENDENT_AMBULATORY_CARE_PROVIDER_SITE_OTHER): Payer: Medicare Other | Admitting: Pharmacist

## 2018-02-08 DIAGNOSIS — Z5181 Encounter for therapeutic drug level monitoring: Secondary | ICD-10-CM

## 2018-02-08 DIAGNOSIS — I214 Non-ST elevation (NSTEMI) myocardial infarction: Secondary | ICD-10-CM

## 2018-02-08 DIAGNOSIS — L080 Pyoderma: Secondary | ICD-10-CM | POA: Diagnosis not present

## 2018-02-08 DIAGNOSIS — L84 Corns and callosities: Secondary | ICD-10-CM | POA: Diagnosis not present

## 2018-02-08 DIAGNOSIS — S91302D Unspecified open wound, left foot, subsequent encounter: Secondary | ICD-10-CM | POA: Diagnosis not present

## 2018-02-08 LAB — POCT INR: INR: 1.9 — AB (ref 2.0–3.0)

## 2018-02-08 NOTE — Telephone Encounter (Signed)
See telephone encounter dated 10/31

## 2018-02-08 NOTE — Patient Instructions (Signed)
Description   Take 1/2 tablet on Saturday while on Fluconazole then Continue taking 1 tablet everyday except 1/2 tablet on Mondays, Wednesdays and Fridays.  Recheck on Monday. Call if any changes [phone].

## 2018-02-08 NOTE — Telephone Encounter (Signed)
New message ° ° °Patient is returning your call. °

## 2018-02-09 DIAGNOSIS — Z86010 Personal history of colonic polyps: Secondary | ICD-10-CM | POA: Diagnosis not present

## 2018-02-09 DIAGNOSIS — Z7901 Long term (current) use of anticoagulants: Secondary | ICD-10-CM | POA: Diagnosis not present

## 2018-02-09 DIAGNOSIS — Z9181 History of falling: Secondary | ICD-10-CM | POA: Diagnosis not present

## 2018-02-09 DIAGNOSIS — I5030 Unspecified diastolic (congestive) heart failure: Secondary | ICD-10-CM | POA: Diagnosis not present

## 2018-02-09 DIAGNOSIS — H547 Unspecified visual loss: Secondary | ICD-10-CM | POA: Diagnosis not present

## 2018-02-09 DIAGNOSIS — E782 Mixed hyperlipidemia: Secondary | ICD-10-CM | POA: Diagnosis not present

## 2018-02-09 DIAGNOSIS — I2510 Atherosclerotic heart disease of native coronary artery without angina pectoris: Secondary | ICD-10-CM | POA: Diagnosis not present

## 2018-02-09 DIAGNOSIS — I69393 Ataxia following cerebral infarction: Secondary | ICD-10-CM | POA: Diagnosis not present

## 2018-02-09 DIAGNOSIS — L97522 Non-pressure chronic ulcer of other part of left foot with fat layer exposed: Secondary | ICD-10-CM | POA: Diagnosis not present

## 2018-02-09 DIAGNOSIS — H3530 Unspecified macular degeneration: Secondary | ICD-10-CM | POA: Diagnosis not present

## 2018-02-09 DIAGNOSIS — I482 Chronic atrial fibrillation, unspecified: Secondary | ICD-10-CM | POA: Diagnosis not present

## 2018-02-09 DIAGNOSIS — I130 Hypertensive heart and chronic kidney disease with heart failure and stage 1 through stage 4 chronic kidney disease, or unspecified chronic kidney disease: Secondary | ICD-10-CM | POA: Diagnosis not present

## 2018-02-09 DIAGNOSIS — F419 Anxiety disorder, unspecified: Secondary | ICD-10-CM | POA: Diagnosis not present

## 2018-02-09 DIAGNOSIS — M1991 Primary osteoarthritis, unspecified site: Secondary | ICD-10-CM | POA: Diagnosis not present

## 2018-02-09 DIAGNOSIS — K210 Gastro-esophageal reflux disease with esophagitis: Secondary | ICD-10-CM | POA: Diagnosis not present

## 2018-02-09 DIAGNOSIS — K259 Gastric ulcer, unspecified as acute or chronic, without hemorrhage or perforation: Secondary | ICD-10-CM | POA: Diagnosis not present

## 2018-02-09 DIAGNOSIS — K5900 Constipation, unspecified: Secondary | ICD-10-CM | POA: Diagnosis not present

## 2018-02-09 DIAGNOSIS — M8580 Other specified disorders of bone density and structure, unspecified site: Secondary | ICD-10-CM | POA: Diagnosis not present

## 2018-02-09 DIAGNOSIS — D649 Anemia, unspecified: Secondary | ICD-10-CM | POA: Diagnosis not present

## 2018-02-09 DIAGNOSIS — I252 Old myocardial infarction: Secondary | ICD-10-CM | POA: Diagnosis not present

## 2018-02-09 DIAGNOSIS — Z87440 Personal history of urinary (tract) infections: Secondary | ICD-10-CM | POA: Diagnosis not present

## 2018-02-09 DIAGNOSIS — N182 Chronic kidney disease, stage 2 (mild): Secondary | ICD-10-CM | POA: Diagnosis not present

## 2018-02-09 NOTE — Telephone Encounter (Signed)
Left message for patient that I was returning her call and sorry that I missed her. Advised patient in message to call back to schedule sooner appointment with Dr. Acie Fredrickson than January if needed.

## 2018-02-12 ENCOUNTER — Ambulatory Visit (INDEPENDENT_AMBULATORY_CARE_PROVIDER_SITE_OTHER): Payer: Medicare Other

## 2018-02-12 DIAGNOSIS — L97522 Non-pressure chronic ulcer of other part of left foot with fat layer exposed: Secondary | ICD-10-CM | POA: Diagnosis not present

## 2018-02-12 DIAGNOSIS — Z5181 Encounter for therapeutic drug level monitoring: Secondary | ICD-10-CM | POA: Diagnosis not present

## 2018-02-12 DIAGNOSIS — I5030 Unspecified diastolic (congestive) heart failure: Secondary | ICD-10-CM | POA: Diagnosis not present

## 2018-02-12 DIAGNOSIS — I214 Non-ST elevation (NSTEMI) myocardial infarction: Secondary | ICD-10-CM

## 2018-02-12 DIAGNOSIS — I130 Hypertensive heart and chronic kidney disease with heart failure and stage 1 through stage 4 chronic kidney disease, or unspecified chronic kidney disease: Secondary | ICD-10-CM | POA: Diagnosis not present

## 2018-02-12 DIAGNOSIS — I482 Chronic atrial fibrillation, unspecified: Secondary | ICD-10-CM | POA: Diagnosis not present

## 2018-02-12 DIAGNOSIS — I2510 Atherosclerotic heart disease of native coronary artery without angina pectoris: Secondary | ICD-10-CM | POA: Diagnosis not present

## 2018-02-12 DIAGNOSIS — N182 Chronic kidney disease, stage 2 (mild): Secondary | ICD-10-CM | POA: Diagnosis not present

## 2018-02-12 LAB — POCT INR: INR: 4.4 — AB (ref 2.0–3.0)

## 2018-02-12 NOTE — Patient Instructions (Signed)
Description   Skip today and tomorrow's dosage of Coumadin, then start taking 1/2 tablet daily while on Fluconazole.  Recheck on Friday due to Fluconazole. Call if any changes [phone].

## 2018-02-14 DIAGNOSIS — I5030 Unspecified diastolic (congestive) heart failure: Secondary | ICD-10-CM | POA: Diagnosis not present

## 2018-02-14 DIAGNOSIS — N182 Chronic kidney disease, stage 2 (mild): Secondary | ICD-10-CM | POA: Diagnosis not present

## 2018-02-14 DIAGNOSIS — L97522 Non-pressure chronic ulcer of other part of left foot with fat layer exposed: Secondary | ICD-10-CM | POA: Diagnosis not present

## 2018-02-14 DIAGNOSIS — I2510 Atherosclerotic heart disease of native coronary artery without angina pectoris: Secondary | ICD-10-CM | POA: Diagnosis not present

## 2018-02-14 DIAGNOSIS — I130 Hypertensive heart and chronic kidney disease with heart failure and stage 1 through stage 4 chronic kidney disease, or unspecified chronic kidney disease: Secondary | ICD-10-CM | POA: Diagnosis not present

## 2018-02-14 DIAGNOSIS — I482 Chronic atrial fibrillation, unspecified: Secondary | ICD-10-CM | POA: Diagnosis not present

## 2018-02-15 ENCOUNTER — Encounter: Payer: Self-pay | Admitting: Gastroenterology

## 2018-02-16 ENCOUNTER — Ambulatory Visit (INDEPENDENT_AMBULATORY_CARE_PROVIDER_SITE_OTHER): Payer: Medicare Other | Admitting: Internal Medicine

## 2018-02-16 DIAGNOSIS — I4891 Unspecified atrial fibrillation: Secondary | ICD-10-CM

## 2018-02-16 DIAGNOSIS — Z5181 Encounter for therapeutic drug level monitoring: Secondary | ICD-10-CM

## 2018-02-16 DIAGNOSIS — N182 Chronic kidney disease, stage 2 (mild): Secondary | ICD-10-CM | POA: Diagnosis not present

## 2018-02-16 DIAGNOSIS — I2510 Atherosclerotic heart disease of native coronary artery without angina pectoris: Secondary | ICD-10-CM | POA: Diagnosis not present

## 2018-02-16 DIAGNOSIS — I482 Chronic atrial fibrillation, unspecified: Secondary | ICD-10-CM | POA: Diagnosis not present

## 2018-02-16 DIAGNOSIS — I130 Hypertensive heart and chronic kidney disease with heart failure and stage 1 through stage 4 chronic kidney disease, or unspecified chronic kidney disease: Secondary | ICD-10-CM | POA: Diagnosis not present

## 2018-02-16 DIAGNOSIS — L97522 Non-pressure chronic ulcer of other part of left foot with fat layer exposed: Secondary | ICD-10-CM | POA: Diagnosis not present

## 2018-02-16 DIAGNOSIS — I5030 Unspecified diastolic (congestive) heart failure: Secondary | ICD-10-CM | POA: Diagnosis not present

## 2018-02-16 LAB — POCT INR: INR: 3.2 — AB (ref 2.0–3.0)

## 2018-02-19 ENCOUNTER — Telehealth: Payer: Self-pay | Admitting: *Deleted

## 2018-02-19 ENCOUNTER — Ambulatory Visit (INDEPENDENT_AMBULATORY_CARE_PROVIDER_SITE_OTHER): Payer: Medicare Other | Admitting: Cardiovascular Disease

## 2018-02-19 DIAGNOSIS — L97522 Non-pressure chronic ulcer of other part of left foot with fat layer exposed: Secondary | ICD-10-CM | POA: Diagnosis not present

## 2018-02-19 DIAGNOSIS — I482 Chronic atrial fibrillation, unspecified: Secondary | ICD-10-CM | POA: Diagnosis not present

## 2018-02-19 DIAGNOSIS — I130 Hypertensive heart and chronic kidney disease with heart failure and stage 1 through stage 4 chronic kidney disease, or unspecified chronic kidney disease: Secondary | ICD-10-CM | POA: Diagnosis not present

## 2018-02-19 DIAGNOSIS — I5030 Unspecified diastolic (congestive) heart failure: Secondary | ICD-10-CM | POA: Diagnosis not present

## 2018-02-19 DIAGNOSIS — I2510 Atherosclerotic heart disease of native coronary artery without angina pectoris: Secondary | ICD-10-CM | POA: Diagnosis not present

## 2018-02-19 DIAGNOSIS — Z5181 Encounter for therapeutic drug level monitoring: Secondary | ICD-10-CM

## 2018-02-19 DIAGNOSIS — N182 Chronic kidney disease, stage 2 (mild): Secondary | ICD-10-CM | POA: Diagnosis not present

## 2018-02-19 LAB — POCT INR: INR: 3 (ref 2.0–3.0)

## 2018-02-19 NOTE — Telephone Encounter (Signed)
Tiffany RN with [facility], called to inform us that the pt was prescribed Doxycycline 100mg . She is unsure when the pt will start or how long the med will be, therefore she will call back with this information because the med interacts with Coumadin. Pt INR will have to be checked sooner and Tiffany RN is aware.

## 2018-02-20 NOTE — Telephone Encounter (Signed)
Called the pt and she stated she stated the medication on yesterday 02/19/18 and she did take 2 pills and the prescription is for 7 days. Geronimo Boot RN will come out to se the pt tomorrow and wil give her an order to check the pt's INR.

## 2018-02-21 ENCOUNTER — Ambulatory Visit (INDEPENDENT_AMBULATORY_CARE_PROVIDER_SITE_OTHER): Payer: Medicare Other

## 2018-02-21 DIAGNOSIS — I5030 Unspecified diastolic (congestive) heart failure: Secondary | ICD-10-CM | POA: Diagnosis not present

## 2018-02-21 DIAGNOSIS — Z5181 Encounter for therapeutic drug level monitoring: Secondary | ICD-10-CM

## 2018-02-21 DIAGNOSIS — L97522 Non-pressure chronic ulcer of other part of left foot with fat layer exposed: Secondary | ICD-10-CM | POA: Diagnosis not present

## 2018-02-21 DIAGNOSIS — I2510 Atherosclerotic heart disease of native coronary artery without angina pectoris: Secondary | ICD-10-CM | POA: Diagnosis not present

## 2018-02-21 DIAGNOSIS — N182 Chronic kidney disease, stage 2 (mild): Secondary | ICD-10-CM | POA: Diagnosis not present

## 2018-02-21 DIAGNOSIS — I482 Chronic atrial fibrillation, unspecified: Secondary | ICD-10-CM

## 2018-02-21 DIAGNOSIS — I130 Hypertensive heart and chronic kidney disease with heart failure and stage 1 through stage 4 chronic kidney disease, or unspecified chronic kidney disease: Secondary | ICD-10-CM | POA: Diagnosis not present

## 2018-02-21 LAB — POCT INR: INR: 2.7 (ref 2.0–3.0)

## 2018-02-21 NOTE — Patient Instructions (Signed)
Description   Spoke with Tiffany Miles-RN [facility] while in pt's home and instructed to have pt to continue taking 1/2 tablet daily while on Doxycycline.   Recheck on Friday-finished Diflucan on Sunday 11/10, started on Doxy 100mg  BID x 7 days on Monday 11/11. Call if any changes [phone].

## 2018-02-22 ENCOUNTER — Encounter: Payer: Self-pay | Admitting: Gastroenterology

## 2018-02-22 ENCOUNTER — Ambulatory Visit (INDEPENDENT_AMBULATORY_CARE_PROVIDER_SITE_OTHER): Payer: Medicare Other | Admitting: Gastroenterology

## 2018-02-22 VITALS — BP 124/62 | HR 72 | Ht 61.0 in | Wt 123.4 lb

## 2018-02-22 DIAGNOSIS — I214 Non-ST elevation (NSTEMI) myocardial infarction: Secondary | ICD-10-CM | POA: Diagnosis not present

## 2018-02-22 DIAGNOSIS — K259 Gastric ulcer, unspecified as acute or chronic, without hemorrhage or perforation: Secondary | ICD-10-CM

## 2018-02-22 DIAGNOSIS — T18128A Food in esophagus causing other injury, initial encounter: Secondary | ICD-10-CM

## 2018-02-22 DIAGNOSIS — K7460 Unspecified cirrhosis of liver: Secondary | ICD-10-CM

## 2018-02-22 DIAGNOSIS — B3781 Candidal esophagitis: Secondary | ICD-10-CM

## 2018-02-22 DIAGNOSIS — R110 Nausea: Secondary | ICD-10-CM | POA: Diagnosis not present

## 2018-02-22 DIAGNOSIS — F1190 Opioid use, unspecified, uncomplicated: Secondary | ICD-10-CM

## 2018-02-22 DIAGNOSIS — Z8719 Personal history of other diseases of the digestive system: Secondary | ICD-10-CM

## 2018-02-22 MED ORDER — ONDANSETRON HCL 8 MG PO TABS: 8.0000 mg | ORAL_TABLET | Freq: Two times a day (BID) | ORAL | 1 refills | Status: DC

## 2018-02-22 NOTE — Patient Instructions (Signed)
We have sent the following medications to your pharmacy for you to pick up at your convenience:  Zofran  You have been scheduled for an endoscopy. Please follow written instructions given to you at your visit today. If you use inhalers (even only as needed), please bring them with you on the day of your procedure. Your physician has requested that you go to [email or web address] and enter the access code given to you at your visit today. This web site gives a general overview about your procedure. However, you should still follow specific instructions given to you by our office regarding your preparation for the procedure.

## 2018-02-22 NOTE — Progress Notes (Signed)
HPI :  84 year old female here for reassessment, known to me for history of suspected cirrhosis, chronic dyspepsia and nausea.  She is here for follow-up after hospitalization. She presented to the hospital at the end of October with worsening chest pain and epigastric pain. Our service was consulted and she underwent an upper endoscopy on October 24. This exam showed esophageal candidiasis along with a food impaction at the distal esophagus associated with LA grade C esophagitis. She was also noted to have a small gastric ulcer and suspected small esophageal varices. Biopsies were negative for H. Pylori. She was given Nexium to take twice daily which she has been compliant with. She was also treated with fluconazole for esophageal candidiasis for 2 weeks.  Generally she reports these medications have helped her symptoms and her pain is improved / gone. She continues to have chronic nausea and a poor appetite. She continues to have some discomfort at times after she eats in her upper abdomen. She denies any dysphagia. She denies any odynophagia. She has been taking doxycycline for a foot infection recently. She otherwise takes Norco usually every night prior to bed for chronic back pain, in addition to Valium. Her caretaker is present in the room with her today, states the patient has had some issues with falls recently. She continues to take her buspirone.  Otherwise in regards to her suspected diagnosis of cirrhosis, she was thought to have mild varices on EGD. She has not had any problems with ascites or jaundice. No history of hepatic encephalopathy. Given her age and comorbidities we have not been aggressively surveilling her for [facility], prior discussions regarding that she did not want any treatment if she developed [place]. She also continues to decline colonoscopy exams for abnormal CT studies showing abnormalities of the colon in the past.  EGD 02/01/2018 - esophageal candidiasis, food impaction, LA  grade C esophagitis, suspected small varices, 32mm gastric ulcer, - biopsies negative for H pylori  Last colonoscopy 2006 - diverticulosis, hemorrhoids, small 15mmhyperplasticpolyp left side EGD 08/18/2014 - no varices    Past Medical History:  Diagnosis Date  . Abnormal liver diagnostic imaging    suspected cirrhosis  . Arthritis   . Atrial fibrillation ([place])   . Chronic back pain   . Diverticulosis   . Dyslipidemia    takes Lipitor daily  . Dysrhythmia    Atrial Fibrillation  . Foot drop    SINCE 1987  . GERD (gastroesophageal reflux disease)   . H/O hiatal hernia   . Hearing impaired    Bilateral hearing aids  . History of bacterial endocarditis   . History of gastritis   . History of stomach ulcers    many yrs ago  . History of TIA (transient ischemic attack)    2013-- RESIDUAL PERIPHERAL VISION RIGHT EYE--  RESOLVED  . Hypertension   . IC (interstitial cystitis)   . Pulmonary hypertension ([place]) 10/2013   Based on TTE  . S/P aortic valve replacement    2005  . Urine incontinence      Past Surgical History:  Procedure Laterality Date  . ABDOMINAL HYSTERECTOMY  1967  . AORTIC VALVE REPLACEMENT  09-29-2003    [facility] pericardial tissue valve  . APPENDECTOMY  1933  . BIOPSY  02/01/2018   Procedure: BIOPSY;  Surgeon: Rush Landmark Telford Nab., MD;  Location: [place];  Service: Gastroenterology;;  . CARDIAC CATHETERIZATION  07-15-2003 DR HELEN PRESTON   MODERATE TO MODERATELY SEVERE CALCIFIC AORTIC STENOSIS/  NORMAL CORONARY ARTERIES  . CARDIOVASCULAR STRESS TEST  01-30-2012  DR NASHER   NORMAL NUCLEAR STUDY/  EF 73%/  NORMAL LVF  . CARPAL TUNNEL RELEASE Bilateral   . CATARACT EXTRACTION W/ INTRAOCULAR LENS  IMPLANT, BILATERAL    . CHOLECYSTECTOMY  1993  . CYSTO WITH HYDRODISTENSION N/A 02/05/2013   Procedure: CYSTOSCOPY/HYDRODISTENSION;  Surgeon: Irine Seal, MD;  Location: [facility];  Service: Urology;  Laterality: N/A;  . CYSTO/  HYDRODISTENTION/ INSTILLATION CLORPACTIN  MULTIPLE  last one 2009  . DILATION AND CURETTAGE OF UTERUS    . ERCP N/A 08/10/2012   Procedure: ENDOSCOPIC RETROGRADE CHOLANGIOPANCREATOGRAPHY (ERCP);  Surgeon: Inda Castle, MD;  Location: [place];  Service: Gastroenterology;  Laterality: N/A;  . ESOPHAGOGASTRODUODENOSCOPY (EGD) WITH PROPOFOL N/A 08/18/2014   Procedure: ESOPHAGOGASTRODUODENOSCOPY (EGD) WITH PROPOFOL;  Surgeon: Inda Castle, MD;  Location: [facility];  Service: Endoscopy;  Laterality: N/A;  . ESOPHAGOGASTRODUODENOSCOPY (EGD) WITH PROPOFOL N/A 02/01/2018   Procedure: ESOPHAGOGASTRODUODENOSCOPY (EGD) WITH PROPOFOL;  Surgeon: Rush Landmark Telford Nab., MD;  Location: [place];  Service: Gastroenterology;  Laterality: N/A;  . FOREIGN BODY REMOVAL  02/01/2018   Procedure: FOREIGN BODY REMOVAL;  Surgeon: Rush Landmark Telford Nab., MD;  Location: [place];  Service: Gastroenterology;;  . LEFT HEART CATHETERIZATION WITH CORONARY ANGIOGRAM N/A 10/18/2013   Procedure: LEFT HEART CATHETERIZATION WITH CORONARY ANGIOGRAM;  Surgeon: Jettie Booze, MD;  Location: [facility] CATH LAB;  Service: Cardiovascular;  Laterality: N/A;  . LUMBAR [place] &  2007  . MACROPLASTIQUE URETHRAL IMPLANTATION  08-27-2009  . MINI-OPEN RIGHT ROTATOR CUFF REPAIR  07-12-2001  . RIGHT SHOULDER ARTHROSCOPY W/ DEBRIDEMENT ROTATOR CUFF AND LABRAL TEAR/ ACROMINOPLASTY/ DISTAL CLAVICLE EXCISION/ CA LIGAMENT RELEASE  10-08-1999  . TONSILLECTOMY AND ADENOIDECTOMY    . TRANSTHORACIC ECHOCARDIOGRAM  08-10-2011   MILD LVH/  EF 55-60%/  NORMAL AVR TISSUE/ MILD MR/  MODERATE DILATED LA/  MILD DILATED RA  . TRANSVAGINAL TAPE PROCEDURE  07-30-2002  . VEIN LIGATION  1969   RIGHT LOWER LEG   Family History  Problem Relation Age of Onset  . Heart disease Father   . Stomach cancer Maternal Grandmother   . Esophageal cancer Maternal Grandfather   . Bipolar disorder Son        Committed Suicide  . Coronary artery  disease Brother   . Bipolar disorder Brother        commited suiside at 15yo  . Alcohol abuse Sister        sister #1  . Alcohol abuse Sister        sister #2  . Colon cancer Maternal Aunt   . Lung disease Neg Hx    Social History   Tobacco Use  . Smoking status: Former Smoker    Packs/day: 0.50    Years: 40.00    Pack years: 20.00    Types: Cigarettes    Start date: 05/06/1949    Last attempt to quit: 03/06/1990    Years since quitting: 27.9  . Smokeless tobacco: Never Used  Substance Use Topics  . Alcohol use: No    Alcohol/week: 0.0 standard drinks    Comment: Remote social EtOH  . Drug use: No   Current Outpatient Medications  Medication Sig Dispense Refill  . atorvastatin (LIPITOR) 20 MG tablet Take 20 mg by mouth daily.    . busPIRone (BUSPAR) 15 MG tablet TAKE ONE TABLET BY MOUTH TWICE A DAY (Patient taking differently: Take 15 mg by mouth 2 (two) times  daily. ) 180 tablet 0  . cycloSPORINE (RESTASIS) 0.05 % ophthalmic emulsion Place 1 drop into both eyes 2 (two) times daily.    . diazepam (VALIUM) 5 MG tablet Take 5 mg by mouth at bedtime as needed (sleep).     Marland Kitchen diltiazem (CARDIZEM) 120 MG tablet Take 120 mg by mouth 2 (two) times daily.    Marland Kitchen esomeprazole (NEXIUM) 40 MG capsule Take 1 capsule (40 mg total) by mouth 2 (two) times daily before a meal. 60 capsule 0  . HYDROcodone-acetaminophen (NORCO/VICODIN) 5-325 MG tablet Take 1 tablet by mouth every 6 (six) hours as needed for severe pain. 14 tablet 0  . hyoscyamine (LEVSIN, ANASPAZ) 0.125 MG tablet Take 0.125 mg by mouth every 4 (four) hours as needed for cramping.     Marland Kitchen lisinopril (PRINIVIL,ZESTRIL) 40 MG tablet Take 40 mg by mouth daily.     . Multiple Vitamins-Minerals (ICAPS AREDS 2) CAPS Take 1 capsule by mouth 2 (two) times daily.    . nitroGLYCERIN (NITROSTAT) 0.4 MG SL tablet Place 1 tablet (0.4 mg total) under the tongue every 5 (five) minutes as needed for chest pain. 30 tablet 0  . ondansetron (ZOFRAN) 8  MG tablet Take 1 tablet (8 mg total) by mouth every 8 (eight) hours as needed for nausea or vomiting. 30 tablet 1  . oxybutynin (DITROPAN) 5 MG tablet Take 5-10 mg by mouth See admin instructions. Take 1 tablet (5 mg) by mouth every morning and 2 tablets (10 mg) at night    . polyethylene glycol (MIRALAX / GLYCOLAX) packet Take 17 g by mouth daily as needed for moderate constipation. 14 each 0  . Polyvinyl Alcohol-Povidone (REFRESH OP) Place 1 drop into both eyes 2 (two) times daily.    . potassium chloride SA (K-DUR,KLOR-CON) 20 MEQ tablet Take 20 mEq by mouth daily as needed (ONLY WHEN TAKING TORSEMIDE).    Marland Kitchen torsemide (DEMADEX) 20 MG tablet Take 20 mg by mouth daily.     Marland Kitchen warfarin (COUMADIN) 5 MG tablet Take 0.5-1 tablets (2.5-5 mg total) by mouth See admin instructions. Take 1 tablet on Tuesday and Thursday then take 1/2 tablet all the other days     No current facility-administered medications for this visit.    Allergies  Allergen Reactions  . Amlodipine Swelling and Other (See Comments)     Unspecified swelling reaction  . Amoxicillin Diarrhea, Nausea And Vomiting and Other (See Comments)    Has patient had a PCN reaction causing immediate rash, facial/tongue/throat swelling, SOB or lightheadedness with hypotension: No Has patient had a PCN reaction causing severe rash involving mucus membranes or skin necrosis: No Has patient had a PCN reaction that required hospitalization: No Has patient had a PCN reaction occurring within the last 10 years: No If all of the above answers are "NO", then may proceed with Cephalosporin use.  . Carafate [Sucralfate] Swelling and Other (See Comments)    Reaction:  Knee swelling/redness   . Cephalexin Diarrhea  . Codeine Nausea And Vomiting  . Irbesartan Swelling and Other (See Comments)    Reaction:  Facial/hand swelling and numbness   . Morphine And Related Other (See Comments)    Pt states that she has a history of addiction with this medication.       . Neurontin [Gabapentin] Swelling and Other (See Comments)    Reaction:  Leg swelling   . Nitrofurantoin Monohyd Macro Diarrhea and Nausea And Vomiting  . Prednisone Other (See Comments)  Reaction:  Elevated BP  . Sulfa Antibiotics Rash     Review of Systems: All systems reviewed and negative except where noted in HPI.    Dg Chest 2 View  Result Date: 01/29/2018 CLINICAL DATA:  Chest pain EXAM: CHEST - 2 VIEW COMPARISON:  08/25/2017 FINDINGS: Post sternotomy changes with valve prosthesis. Dense mitral calcification. Mild cardiomegaly. No pleural effusion or focal opacity. Aortic atherosclerosis. No pneumothorax. IMPRESSION: No active cardiopulmonary disease.  Mild cardiomegaly. Electronically Signed   By: Donavan Foil M.D.   On: 01/29/2018 16:55   Lab Results  Component Value Date   WBC 7.1 02/02/2018   HGB 10.9 (L) 02/02/2018   HCT 33.4 (L) 02/02/2018   MCV 88.1 02/02/2018   PLT 179 02/02/2018    Lab Results  Component Value Date   CREATININE 0.88 02/02/2018   BUN 8 02/02/2018   NA 134 (L) 02/02/2018   K 4.5 02/02/2018   CL 100 02/02/2018   CO2 28 02/02/2018  . Lab Results  Component Value Date   ALT 23 09/29/2016   AST 36 09/29/2016   ALKPHOS 96 09/29/2016   BILITOT 1.3 (H) 09/29/2016      Physical Exam: BP 124/62   Pulse 72   Ht 5\' 1"  (1.549 m)   Wt 123 lb 6 oz (56 kg)   BMI 23.31 kg/m  Constitutional: Pleasant, female in no acute distress, using walker to ambulate HEENT: Normocephalic and atraumatic. Conjunctivae are normal. No scleral icterus. Neck supple.  Cardiovascular: Normal rate, regular rhythm.  Pulmonary/chest: Effort normal and breath sounds normal. No wheezing, rales or rhonchi. Abdominal: Soft, nondistended, nontender. . There are no masses palpable. No hepatomegaly. Extremities: no edema Lymphadenopathy: No cervical adenopathy noted. Neurological: Alert and oriented to person place and time. Skin: Skin is warm and dry. No rashes  noted. Psychiatric: Normal mood and affect. Behavior is normal.   ASSESSMENT AND PLAN: 84 year old female here for reassessment following issues:  Gastric ulcer / esophageal candidiasis / food impaction - recently found all 3 of these when she was admitted to the hospital with chest and abdominal pain. Status post therapy with Nexium and fluconazole her discomfort has resolved. Biopsies negative for HP. She did interestingly denies any dysphagia and never knew she had a food impaction when she initially presented. She'll stay on a soft diet and chew food well. She will continue Nexium twice daily. We discussed whether or not she wanted to have a follow-up endoscopy to evaluate her stomach and esophagus after therapy. Given her ongoing dyspepsia she did want to follow up with an endoscopy. We will schedule this for later in December to allow some time for the ulcer to heal. Upon discussion of risks and benefits of endoscopy she wanted to proceed. She can continue Coumadin for diagnostic EGD.  Chronic nausea / dyspepsia / chronic narcotic usage - counseled her I think a lot of her chronic nausea and dyspepsia is related to her chronic narcotic use. She had been improved with buspirone and she will continue that, however symptoms recently worsened likely by issues as above. Moving forward that in light of her history of cirrhosis and history of falls and her age, and her chronic nausea, I don't think chronic narcotics as a good long-term option for her especially as she ages. I recommend she follow up with her prescribing provider for this issue and discuss other modalities for pain control. Her care provider present with her today agreed with this and will follow-up with her  pain management physician.  Cirrhosis - small varices on EGD, otherwise appears to be compensated. We discussed long-term, how aggressive she wants to be with management of this in light of her age and other medical problems. She is not  interested in any aggressive measures. She does not wish to have [place] screening as if she did have a hepatic mass lesion noted she did not think she would want any sort of biopsy or invasive therapy. In this light she wanted to hold off on further [facility] screening exams which is reasonable. I counseled her on symptoms for decompensation, if she notes any of these she will contact us.   History of colitis- prior CT scan showing colitis last year, CTangioshowed no vascular compromise.In general I discussed how we normally follow-up CT scan finding such as this with a colonoscopy,however given her age and comorbidities she strongly declined colonoscopyand never wishes to have one again. If she has recurrence of her previous pain she should contact us.  [place] Cellar, MD [facility]

## 2018-02-23 ENCOUNTER — Ambulatory Visit (INDEPENDENT_AMBULATORY_CARE_PROVIDER_SITE_OTHER): Payer: Medicare Other | Admitting: Cardiovascular Disease

## 2018-02-23 DIAGNOSIS — I5030 Unspecified diastolic (congestive) heart failure: Secondary | ICD-10-CM | POA: Diagnosis not present

## 2018-02-23 DIAGNOSIS — Z5181 Encounter for therapeutic drug level monitoring: Secondary | ICD-10-CM

## 2018-02-23 DIAGNOSIS — I130 Hypertensive heart and chronic kidney disease with heart failure and stage 1 through stage 4 chronic kidney disease, or unspecified chronic kidney disease: Secondary | ICD-10-CM | POA: Diagnosis not present

## 2018-02-23 DIAGNOSIS — I482 Chronic atrial fibrillation, unspecified: Secondary | ICD-10-CM | POA: Diagnosis not present

## 2018-02-23 DIAGNOSIS — N182 Chronic kidney disease, stage 2 (mild): Secondary | ICD-10-CM | POA: Diagnosis not present

## 2018-02-23 DIAGNOSIS — L97522 Non-pressure chronic ulcer of other part of left foot with fat layer exposed: Secondary | ICD-10-CM | POA: Diagnosis not present

## 2018-02-23 DIAGNOSIS — I2510 Atherosclerotic heart disease of native coronary artery without angina pectoris: Secondary | ICD-10-CM | POA: Diagnosis not present

## 2018-02-23 LAB — POCT INR: INR: 2.2 (ref 2.0–3.0)

## 2018-02-26 ENCOUNTER — Ambulatory Visit (INDEPENDENT_AMBULATORY_CARE_PROVIDER_SITE_OTHER): Payer: Medicare Other | Admitting: Internal Medicine

## 2018-02-26 DIAGNOSIS — L97522 Non-pressure chronic ulcer of other part of left foot with fat layer exposed: Secondary | ICD-10-CM | POA: Diagnosis not present

## 2018-02-26 DIAGNOSIS — Z5181 Encounter for therapeutic drug level monitoring: Secondary | ICD-10-CM

## 2018-02-26 DIAGNOSIS — N182 Chronic kidney disease, stage 2 (mild): Secondary | ICD-10-CM | POA: Diagnosis not present

## 2018-02-26 DIAGNOSIS — I2510 Atherosclerotic heart disease of native coronary artery without angina pectoris: Secondary | ICD-10-CM | POA: Diagnosis not present

## 2018-02-26 DIAGNOSIS — I482 Chronic atrial fibrillation, unspecified: Secondary | ICD-10-CM | POA: Diagnosis not present

## 2018-02-26 DIAGNOSIS — I130 Hypertensive heart and chronic kidney disease with heart failure and stage 1 through stage 4 chronic kidney disease, or unspecified chronic kidney disease: Secondary | ICD-10-CM | POA: Diagnosis not present

## 2018-02-26 DIAGNOSIS — I5030 Unspecified diastolic (congestive) heart failure: Secondary | ICD-10-CM | POA: Diagnosis not present

## 2018-02-26 LAB — POCT INR: INR: 2.3 (ref 2.0–3.0)

## 2018-03-02 ENCOUNTER — Ambulatory Visit (INDEPENDENT_AMBULATORY_CARE_PROVIDER_SITE_OTHER): Payer: Medicare Other | Admitting: Cardiovascular Disease

## 2018-03-02 ENCOUNTER — Other Ambulatory Visit: Payer: Self-pay | Admitting: Gastroenterology

## 2018-03-02 DIAGNOSIS — Z5181 Encounter for therapeutic drug level monitoring: Secondary | ICD-10-CM | POA: Diagnosis not present

## 2018-03-02 DIAGNOSIS — I5030 Unspecified diastolic (congestive) heart failure: Secondary | ICD-10-CM | POA: Diagnosis not present

## 2018-03-02 DIAGNOSIS — I482 Chronic atrial fibrillation, unspecified: Secondary | ICD-10-CM | POA: Diagnosis not present

## 2018-03-02 DIAGNOSIS — I130 Hypertensive heart and chronic kidney disease with heart failure and stage 1 through stage 4 chronic kidney disease, or unspecified chronic kidney disease: Secondary | ICD-10-CM | POA: Diagnosis not present

## 2018-03-02 DIAGNOSIS — N182 Chronic kidney disease, stage 2 (mild): Secondary | ICD-10-CM | POA: Diagnosis not present

## 2018-03-02 DIAGNOSIS — L97522 Non-pressure chronic ulcer of other part of left foot with fat layer exposed: Secondary | ICD-10-CM | POA: Diagnosis not present

## 2018-03-02 DIAGNOSIS — I2510 Atherosclerotic heart disease of native coronary artery without angina pectoris: Secondary | ICD-10-CM | POA: Diagnosis not present

## 2018-03-02 LAB — POCT INR: INR: 1.9 — AB (ref 2.0–3.0)

## 2018-03-02 MED ORDER — ESOMEPRAZOLE MAGNESIUM 40 MG PO CPDR: 40.0000 mg | DELAYED_RELEASE_CAPSULE | Freq: Two times a day (BID) | ORAL | 2 refills | Status: DC

## 2018-03-02 NOTE — Telephone Encounter (Signed)
Rx sent 

## 2018-03-05 DIAGNOSIS — I482 Chronic atrial fibrillation, unspecified: Secondary | ICD-10-CM | POA: Diagnosis not present

## 2018-03-05 DIAGNOSIS — I2510 Atherosclerotic heart disease of native coronary artery without angina pectoris: Secondary | ICD-10-CM | POA: Diagnosis not present

## 2018-03-05 DIAGNOSIS — M19011 Primary osteoarthritis, right shoulder: Secondary | ICD-10-CM | POA: Diagnosis not present

## 2018-03-05 DIAGNOSIS — I5030 Unspecified diastolic (congestive) heart failure: Secondary | ICD-10-CM | POA: Diagnosis not present

## 2018-03-05 DIAGNOSIS — L97522 Non-pressure chronic ulcer of other part of left foot with fat layer exposed: Secondary | ICD-10-CM | POA: Diagnosis not present

## 2018-03-05 DIAGNOSIS — N182 Chronic kidney disease, stage 2 (mild): Secondary | ICD-10-CM | POA: Diagnosis not present

## 2018-03-05 DIAGNOSIS — I130 Hypertensive heart and chronic kidney disease with heart failure and stage 1 through stage 4 chronic kidney disease, or unspecified chronic kidney disease: Secondary | ICD-10-CM | POA: Diagnosis not present

## 2018-03-07 ENCOUNTER — Ambulatory Visit (INDEPENDENT_AMBULATORY_CARE_PROVIDER_SITE_OTHER): Payer: Medicare Other | Admitting: Cardiovascular Disease

## 2018-03-07 DIAGNOSIS — I2510 Atherosclerotic heart disease of native coronary artery without angina pectoris: Secondary | ICD-10-CM | POA: Diagnosis not present

## 2018-03-07 DIAGNOSIS — L97522 Non-pressure chronic ulcer of other part of left foot with fat layer exposed: Secondary | ICD-10-CM | POA: Diagnosis not present

## 2018-03-07 DIAGNOSIS — L97509 Non-pressure chronic ulcer of other part of unspecified foot with unspecified severity: Secondary | ICD-10-CM | POA: Diagnosis not present

## 2018-03-07 DIAGNOSIS — I482 Chronic atrial fibrillation, unspecified: Secondary | ICD-10-CM | POA: Diagnosis not present

## 2018-03-07 DIAGNOSIS — Z5181 Encounter for therapeutic drug level monitoring: Secondary | ICD-10-CM

## 2018-03-07 DIAGNOSIS — E46 Unspecified protein-calorie malnutrition: Secondary | ICD-10-CM | POA: Diagnosis not present

## 2018-03-07 DIAGNOSIS — I4891 Unspecified atrial fibrillation: Secondary | ICD-10-CM | POA: Diagnosis not present

## 2018-03-07 DIAGNOSIS — J449 Chronic obstructive pulmonary disease, unspecified: Secondary | ICD-10-CM | POA: Diagnosis not present

## 2018-03-07 DIAGNOSIS — K259 Gastric ulcer, unspecified as acute or chronic, without hemorrhage or perforation: Secondary | ICD-10-CM | POA: Diagnosis not present

## 2018-03-07 DIAGNOSIS — N182 Chronic kidney disease, stage 2 (mild): Secondary | ICD-10-CM | POA: Diagnosis not present

## 2018-03-07 DIAGNOSIS — E782 Mixed hyperlipidemia: Secondary | ICD-10-CM | POA: Diagnosis not present

## 2018-03-07 DIAGNOSIS — I5030 Unspecified diastolic (congestive) heart failure: Secondary | ICD-10-CM | POA: Diagnosis not present

## 2018-03-07 DIAGNOSIS — I130 Hypertensive heart and chronic kidney disease with heart failure and stage 1 through stage 4 chronic kidney disease, or unspecified chronic kidney disease: Secondary | ICD-10-CM | POA: Diagnosis not present

## 2018-03-07 LAB — POCT INR: INR: 1.7 — AB (ref 2.0–3.0)

## 2018-03-07 NOTE — Patient Instructions (Signed)
Description   Spoke with Tiffany-RN [facility] while in pt's home and instructed to have pt take 1.5 tablets today, then start taking 1 tablet daily except 1/2 tablet on Tuesdays, Thursdays, and Saturdays. Recheck in 7 days.  Call if any changes [phone].

## 2018-03-09 DIAGNOSIS — I130 Hypertensive heart and chronic kidney disease with heart failure and stage 1 through stage 4 chronic kidney disease, or unspecified chronic kidney disease: Secondary | ICD-10-CM | POA: Diagnosis not present

## 2018-03-09 DIAGNOSIS — L97522 Non-pressure chronic ulcer of other part of left foot with fat layer exposed: Secondary | ICD-10-CM | POA: Diagnosis not present

## 2018-03-09 DIAGNOSIS — I2510 Atherosclerotic heart disease of native coronary artery without angina pectoris: Secondary | ICD-10-CM | POA: Diagnosis not present

## 2018-03-09 DIAGNOSIS — I5030 Unspecified diastolic (congestive) heart failure: Secondary | ICD-10-CM | POA: Diagnosis not present

## 2018-03-09 DIAGNOSIS — N182 Chronic kidney disease, stage 2 (mild): Secondary | ICD-10-CM | POA: Diagnosis not present

## 2018-03-09 DIAGNOSIS — I482 Chronic atrial fibrillation, unspecified: Secondary | ICD-10-CM | POA: Diagnosis not present

## 2018-03-12 DIAGNOSIS — N182 Chronic kidney disease, stage 2 (mild): Secondary | ICD-10-CM | POA: Diagnosis not present

## 2018-03-12 DIAGNOSIS — I130 Hypertensive heart and chronic kidney disease with heart failure and stage 1 through stage 4 chronic kidney disease, or unspecified chronic kidney disease: Secondary | ICD-10-CM | POA: Diagnosis not present

## 2018-03-12 DIAGNOSIS — I2510 Atherosclerotic heart disease of native coronary artery without angina pectoris: Secondary | ICD-10-CM | POA: Diagnosis not present

## 2018-03-12 DIAGNOSIS — L97522 Non-pressure chronic ulcer of other part of left foot with fat layer exposed: Secondary | ICD-10-CM | POA: Diagnosis not present

## 2018-03-12 DIAGNOSIS — I482 Chronic atrial fibrillation, unspecified: Secondary | ICD-10-CM | POA: Diagnosis not present

## 2018-03-12 DIAGNOSIS — I5030 Unspecified diastolic (congestive) heart failure: Secondary | ICD-10-CM | POA: Diagnosis not present

## 2018-03-14 ENCOUNTER — Ambulatory Visit (INDEPENDENT_AMBULATORY_CARE_PROVIDER_SITE_OTHER): Payer: Medicare Other | Admitting: Pharmacist

## 2018-03-14 DIAGNOSIS — I5030 Unspecified diastolic (congestive) heart failure: Secondary | ICD-10-CM | POA: Diagnosis not present

## 2018-03-14 DIAGNOSIS — I130 Hypertensive heart and chronic kidney disease with heart failure and stage 1 through stage 4 chronic kidney disease, or unspecified chronic kidney disease: Secondary | ICD-10-CM | POA: Diagnosis not present

## 2018-03-14 DIAGNOSIS — I482 Chronic atrial fibrillation, unspecified: Secondary | ICD-10-CM | POA: Diagnosis not present

## 2018-03-14 DIAGNOSIS — L97522 Non-pressure chronic ulcer of other part of left foot with fat layer exposed: Secondary | ICD-10-CM | POA: Diagnosis not present

## 2018-03-14 DIAGNOSIS — I2510 Atherosclerotic heart disease of native coronary artery without angina pectoris: Secondary | ICD-10-CM | POA: Diagnosis not present

## 2018-03-14 DIAGNOSIS — Z5181 Encounter for therapeutic drug level monitoring: Secondary | ICD-10-CM | POA: Diagnosis not present

## 2018-03-14 DIAGNOSIS — N182 Chronic kidney disease, stage 2 (mild): Secondary | ICD-10-CM | POA: Diagnosis not present

## 2018-03-14 LAB — POCT INR: INR: 1.7 — AB (ref 2.0–3.0)

## 2018-03-15 ENCOUNTER — Encounter (HOSPITAL_BASED_OUTPATIENT_CLINIC_OR_DEPARTMENT_OTHER): Payer: Medicare Other | Attending: Internal Medicine

## 2018-03-15 DIAGNOSIS — I2510 Atherosclerotic heart disease of native coronary artery without angina pectoris: Secondary | ICD-10-CM | POA: Diagnosis not present

## 2018-03-15 DIAGNOSIS — Y838 Other surgical procedures as the cause of abnormal reaction of the patient, or of later complication, without mention of misadventure at the time of the procedure: Secondary | ICD-10-CM | POA: Insufficient documentation

## 2018-03-15 DIAGNOSIS — S61411A Laceration without foreign body of right hand, initial encounter: Secondary | ICD-10-CM | POA: Diagnosis not present

## 2018-03-15 DIAGNOSIS — W19XXXA Unspecified fall, initial encounter: Secondary | ICD-10-CM | POA: Diagnosis not present

## 2018-03-15 DIAGNOSIS — I252 Old myocardial infarction: Secondary | ICD-10-CM | POA: Diagnosis not present

## 2018-03-15 DIAGNOSIS — Z87891 Personal history of nicotine dependence: Secondary | ICD-10-CM | POA: Diagnosis not present

## 2018-03-15 DIAGNOSIS — T8189XA Other complications of procedures, not elsewhere classified, initial encounter: Secondary | ICD-10-CM | POA: Insufficient documentation

## 2018-03-15 DIAGNOSIS — F0390 Unspecified dementia without behavioral disturbance: Secondary | ICD-10-CM | POA: Diagnosis not present

## 2018-03-16 ENCOUNTER — Other Ambulatory Visit: Payer: Self-pay

## 2018-03-16 MED ORDER — BUSPIRONE HCL 15 MG PO TABS: 15.0000 mg | ORAL_TABLET | Freq: Two times a day (BID) | ORAL | 2 refills | Status: DC

## 2018-03-19 DIAGNOSIS — M5416 Radiculopathy, lumbar region: Secondary | ICD-10-CM | POA: Diagnosis not present

## 2018-03-19 DIAGNOSIS — I10 Essential (primary) hypertension: Secondary | ICD-10-CM | POA: Diagnosis not present

## 2018-03-19 DIAGNOSIS — M48061 Spinal stenosis, lumbar region without neurogenic claudication: Secondary | ICD-10-CM | POA: Diagnosis not present

## 2018-03-19 DIAGNOSIS — E871 Hypo-osmolality and hyponatremia: Secondary | ICD-10-CM | POA: Diagnosis not present

## 2018-03-20 DIAGNOSIS — I482 Chronic atrial fibrillation, unspecified: Secondary | ICD-10-CM | POA: Diagnosis not present

## 2018-03-20 DIAGNOSIS — I2510 Atherosclerotic heart disease of native coronary artery without angina pectoris: Secondary | ICD-10-CM | POA: Diagnosis not present

## 2018-03-20 DIAGNOSIS — I130 Hypertensive heart and chronic kidney disease with heart failure and stage 1 through stage 4 chronic kidney disease, or unspecified chronic kidney disease: Secondary | ICD-10-CM | POA: Diagnosis not present

## 2018-03-20 DIAGNOSIS — I5030 Unspecified diastolic (congestive) heart failure: Secondary | ICD-10-CM | POA: Diagnosis not present

## 2018-03-20 DIAGNOSIS — L97522 Non-pressure chronic ulcer of other part of left foot with fat layer exposed: Secondary | ICD-10-CM | POA: Diagnosis not present

## 2018-03-20 DIAGNOSIS — N182 Chronic kidney disease, stage 2 (mild): Secondary | ICD-10-CM | POA: Diagnosis not present

## 2018-03-21 ENCOUNTER — Ambulatory Visit (INDEPENDENT_AMBULATORY_CARE_PROVIDER_SITE_OTHER): Payer: Medicare Other | Admitting: Interventional Cardiology

## 2018-03-21 DIAGNOSIS — L97522 Non-pressure chronic ulcer of other part of left foot with fat layer exposed: Secondary | ICD-10-CM | POA: Diagnosis not present

## 2018-03-21 DIAGNOSIS — I482 Chronic atrial fibrillation, unspecified: Secondary | ICD-10-CM | POA: Diagnosis not present

## 2018-03-21 DIAGNOSIS — Z5181 Encounter for therapeutic drug level monitoring: Secondary | ICD-10-CM | POA: Diagnosis not present

## 2018-03-21 DIAGNOSIS — I130 Hypertensive heart and chronic kidney disease with heart failure and stage 1 through stage 4 chronic kidney disease, or unspecified chronic kidney disease: Secondary | ICD-10-CM | POA: Diagnosis not present

## 2018-03-21 DIAGNOSIS — I5030 Unspecified diastolic (congestive) heart failure: Secondary | ICD-10-CM | POA: Diagnosis not present

## 2018-03-21 DIAGNOSIS — N182 Chronic kidney disease, stage 2 (mild): Secondary | ICD-10-CM | POA: Diagnosis not present

## 2018-03-21 DIAGNOSIS — I2510 Atherosclerotic heart disease of native coronary artery without angina pectoris: Secondary | ICD-10-CM | POA: Diagnosis not present

## 2018-03-21 LAB — POCT INR: INR: 2.6 (ref 2.0–3.0)

## 2018-03-21 NOTE — Patient Instructions (Signed)
Description   Spoke with Tiffany-RN [facility] while in pt's home and instructed to have pt taking 1 tablet daily except 1/2 tablet on Tuesdays and Saturdays. Recheck in 1 week.  Call if any changes [phone].

## 2018-03-22 DIAGNOSIS — I252 Old myocardial infarction: Secondary | ICD-10-CM | POA: Diagnosis not present

## 2018-03-22 DIAGNOSIS — Z87891 Personal history of nicotine dependence: Secondary | ICD-10-CM | POA: Diagnosis not present

## 2018-03-22 DIAGNOSIS — M79672 Pain in left foot: Secondary | ICD-10-CM | POA: Diagnosis not present

## 2018-03-22 DIAGNOSIS — F0390 Unspecified dementia without behavioral disturbance: Secondary | ICD-10-CM | POA: Diagnosis not present

## 2018-03-22 DIAGNOSIS — S61411A Laceration without foreign body of right hand, initial encounter: Secondary | ICD-10-CM | POA: Diagnosis not present

## 2018-03-22 DIAGNOSIS — I2510 Atherosclerotic heart disease of native coronary artery without angina pectoris: Secondary | ICD-10-CM | POA: Diagnosis not present

## 2018-03-22 DIAGNOSIS — T8189XA Other complications of procedures, not elsewhere classified, initial encounter: Secondary | ICD-10-CM | POA: Diagnosis not present

## 2018-03-26 DIAGNOSIS — N182 Chronic kidney disease, stage 2 (mild): Secondary | ICD-10-CM | POA: Diagnosis not present

## 2018-03-26 DIAGNOSIS — I2510 Atherosclerotic heart disease of native coronary artery without angina pectoris: Secondary | ICD-10-CM | POA: Diagnosis not present

## 2018-03-26 DIAGNOSIS — L97522 Non-pressure chronic ulcer of other part of left foot with fat layer exposed: Secondary | ICD-10-CM | POA: Diagnosis not present

## 2018-03-26 DIAGNOSIS — I482 Chronic atrial fibrillation, unspecified: Secondary | ICD-10-CM | POA: Diagnosis not present

## 2018-03-26 DIAGNOSIS — I130 Hypertensive heart and chronic kidney disease with heart failure and stage 1 through stage 4 chronic kidney disease, or unspecified chronic kidney disease: Secondary | ICD-10-CM | POA: Diagnosis not present

## 2018-03-26 DIAGNOSIS — I5030 Unspecified diastolic (congestive) heart failure: Secondary | ICD-10-CM | POA: Diagnosis not present

## 2018-03-28 ENCOUNTER — Ambulatory Visit (INDEPENDENT_AMBULATORY_CARE_PROVIDER_SITE_OTHER): Payer: Medicare Other | Admitting: Internal Medicine

## 2018-03-28 DIAGNOSIS — I482 Chronic atrial fibrillation, unspecified: Secondary | ICD-10-CM | POA: Diagnosis not present

## 2018-03-28 DIAGNOSIS — Z5181 Encounter for therapeutic drug level monitoring: Secondary | ICD-10-CM | POA: Diagnosis not present

## 2018-03-28 DIAGNOSIS — N182 Chronic kidney disease, stage 2 (mild): Secondary | ICD-10-CM | POA: Diagnosis not present

## 2018-03-28 DIAGNOSIS — I130 Hypertensive heart and chronic kidney disease with heart failure and stage 1 through stage 4 chronic kidney disease, or unspecified chronic kidney disease: Secondary | ICD-10-CM | POA: Diagnosis not present

## 2018-03-28 DIAGNOSIS — I5030 Unspecified diastolic (congestive) heart failure: Secondary | ICD-10-CM | POA: Diagnosis not present

## 2018-03-28 DIAGNOSIS — I2510 Atherosclerotic heart disease of native coronary artery without angina pectoris: Secondary | ICD-10-CM | POA: Diagnosis not present

## 2018-03-28 DIAGNOSIS — L97522 Non-pressure chronic ulcer of other part of left foot with fat layer exposed: Secondary | ICD-10-CM | POA: Diagnosis not present

## 2018-03-28 LAB — POCT INR: INR: 2.4 (ref 2.0–3.0)

## 2018-03-29 DIAGNOSIS — S61411A Laceration without foreign body of right hand, initial encounter: Secondary | ICD-10-CM | POA: Diagnosis not present

## 2018-03-29 DIAGNOSIS — I252 Old myocardial infarction: Secondary | ICD-10-CM | POA: Diagnosis not present

## 2018-03-29 DIAGNOSIS — Z87891 Personal history of nicotine dependence: Secondary | ICD-10-CM | POA: Diagnosis not present

## 2018-03-29 DIAGNOSIS — F0390 Unspecified dementia without behavioral disturbance: Secondary | ICD-10-CM | POA: Diagnosis not present

## 2018-03-29 DIAGNOSIS — T8189XA Other complications of procedures, not elsewhere classified, initial encounter: Secondary | ICD-10-CM | POA: Diagnosis not present

## 2018-03-29 DIAGNOSIS — I2510 Atherosclerotic heart disease of native coronary artery without angina pectoris: Secondary | ICD-10-CM | POA: Diagnosis not present

## 2018-04-03 DIAGNOSIS — I130 Hypertensive heart and chronic kidney disease with heart failure and stage 1 through stage 4 chronic kidney disease, or unspecified chronic kidney disease: Secondary | ICD-10-CM | POA: Diagnosis not present

## 2018-04-03 DIAGNOSIS — N182 Chronic kidney disease, stage 2 (mild): Secondary | ICD-10-CM | POA: Diagnosis not present

## 2018-04-03 DIAGNOSIS — L97522 Non-pressure chronic ulcer of other part of left foot with fat layer exposed: Secondary | ICD-10-CM | POA: Diagnosis not present

## 2018-04-03 DIAGNOSIS — I2510 Atherosclerotic heart disease of native coronary artery without angina pectoris: Secondary | ICD-10-CM | POA: Diagnosis not present

## 2018-04-03 DIAGNOSIS — I5030 Unspecified diastolic (congestive) heart failure: Secondary | ICD-10-CM | POA: Diagnosis not present

## 2018-04-03 DIAGNOSIS — I482 Chronic atrial fibrillation, unspecified: Secondary | ICD-10-CM | POA: Diagnosis not present

## 2018-04-06 DIAGNOSIS — I482 Chronic atrial fibrillation, unspecified: Secondary | ICD-10-CM | POA: Diagnosis not present

## 2018-04-06 DIAGNOSIS — I5030 Unspecified diastolic (congestive) heart failure: Secondary | ICD-10-CM | POA: Diagnosis not present

## 2018-04-06 DIAGNOSIS — N182 Chronic kidney disease, stage 2 (mild): Secondary | ICD-10-CM | POA: Diagnosis not present

## 2018-04-06 DIAGNOSIS — I2510 Atherosclerotic heart disease of native coronary artery without angina pectoris: Secondary | ICD-10-CM | POA: Diagnosis not present

## 2018-04-06 DIAGNOSIS — I130 Hypertensive heart and chronic kidney disease with heart failure and stage 1 through stage 4 chronic kidney disease, or unspecified chronic kidney disease: Secondary | ICD-10-CM | POA: Diagnosis not present

## 2018-04-06 DIAGNOSIS — L97522 Non-pressure chronic ulcer of other part of left foot with fat layer exposed: Secondary | ICD-10-CM | POA: Diagnosis not present

## 2018-04-09 ENCOUNTER — Encounter: Payer: Self-pay | Admitting: Gastroenterology

## 2018-04-09 ENCOUNTER — Ambulatory Visit (AMBULATORY_SURGERY_CENTER): Payer: Medicare Other | Admitting: Gastroenterology

## 2018-04-09 VITALS — BP 119/80 | HR 97 | Temp 96.4°F | Resp 16 | Ht 61.0 in | Wt 123.0 lb

## 2018-04-09 DIAGNOSIS — I272 Pulmonary hypertension, unspecified: Secondary | ICD-10-CM | POA: Diagnosis not present

## 2018-04-09 DIAGNOSIS — K2100 Gastro-esophageal reflux disease with esophagitis, without bleeding: Secondary | ICD-10-CM

## 2018-04-09 DIAGNOSIS — I4891 Unspecified atrial fibrillation: Secondary | ICD-10-CM | POA: Diagnosis not present

## 2018-04-09 DIAGNOSIS — K766 Portal hypertension: Secondary | ICD-10-CM | POA: Diagnosis not present

## 2018-04-09 DIAGNOSIS — K259 Gastric ulcer, unspecified as acute or chronic, without hemorrhage or perforation: Secondary | ICD-10-CM

## 2018-04-09 DIAGNOSIS — B3781 Candidal esophagitis: Secondary | ICD-10-CM

## 2018-04-09 DIAGNOSIS — J449 Chronic obstructive pulmonary disease, unspecified: Secondary | ICD-10-CM | POA: Diagnosis not present

## 2018-04-09 MED ORDER — SODIUM CHLORIDE 0.9 % IV SOLN: 500.0000 mL | Freq: Once | INTRAVENOUS | Status: DC

## 2018-04-09 NOTE — Patient Instructions (Addendum)
YOU HAD AN ENDOSCOPIC PROCEDURE TODAY AT [facility]:   Refer to the procedure report that was given to you for any specific questions about what was found during the examination.  If the procedure report does not answer your questions, please call your gastroenterologist to clarify.  If you requested that your care partner not be given the details of your procedure findings, then the procedure report has been included in a sealed envelope for you to review at your convenience later.  YOU SHOULD EXPECT: Some feelings of bloating in the abdomen. Passage of more gas than usual.  Walking can help get rid of the air that was put into your GI tract during the procedure and reduce the bloating. If you had a lower endoscopy (such as a colonoscopy or flexible sigmoidoscopy) you may notice spotting of blood in your stool or on the toilet paper. If you underwent a bowel prep for your procedure, you may not have a normal bowel movement for a few days.  Please Note:  You might notice some irritation and congestion in your nose or some drainage.  This is from the oxygen used during your procedure.  There is no need for concern and it should clear up in a day or so.  SYMPTOMS TO REPORT IMMEDIATELY:   Following upper endoscopy (EGD)  Vomiting of blood or coffee ground material  New chest pain or pain under the shoulder blades  Painful or persistently difficult swallowing  New shortness of breath  Fever of 100F or higher  Black, tarry-looking stools  For urgent or emergent issues, a gastroenterologist can be reached at any hour by calling [phone].   DIET:  We do recommend a small meal at first, but then you may proceed to your regular diet.  Drink plenty of fluids but you should avoid alcoholic beverages for 24 hours.  ACTIVITY:  You should plan to take it easy for the rest of today and you should NOT DRIVE or use heavy machinery until tomorrow (because of the sedation medicines used  during the test).    FOLLOW UP: Our staff will call the number listed on your records the next business day following your procedure to check on you and address any questions or concerns that you may have regarding the information given to you following your procedure. If we do not reach you, we will leave a message.  However, if you are feeling well and you are not experiencing any problems, there is no need to return our call.  We will assume that you have returned to your regular daily activities without incident.  If any biopsies were taken you will be contacted by phone or by letter within the next 1-3 weeks.  Please call us at [phone] if you have not heard about the biopsies in 3 weeks.   Continue all medications including Coumadin today Decrease Protonix to once a day  SIGNATURES/CONFIDENTIALITY: You and/or your care partner have signed paperwork which will be entered into your electronic medical record.  These signatures attest to the fact that that the information above on your After Visit Summary has been reviewed and is understood.  Full responsibility of the confidentiality of this discharge information lies with you and/or your care-partner.

## 2018-04-09 NOTE — Op Note (Signed)
[place] Patient Name: Brittany [name] Procedure Date: 04/09/2018 10:27 AM MRN: [id number] Endoscopist: Remo Lipps P. Havery Moros , MD Age: 84 Referring MD:  Date of Birth: [birth year] Gender: Female Account #: [id number] Procedure:                Upper GI endoscopy Indications:              Follow-up from hospitalization for severe reflux                            esophagitis associated with food impaction, also                            with gastric ulcer noted and esophageal                            candidiasis. history of cirrhosis with small                            esophageal varices reported. On nexium twice daily                            with resolution of symptoms. On coumadin. Prior                            testing for H pylori negative. Medicines:                Monitored Anesthesia Care Procedure:                Pre-Anesthesia Assessment:                           - Prior to the procedure, a History and Physical                            was performed, and patient medications and                            allergies were reviewed. The patient's tolerance of                            previous anesthesia was also reviewed. The risks                            and benefits of the procedure and the sedation                            options and risks were discussed with the patient.                            All questions were answered, and informed consent                            was obtained. Prior Anticoagulants: The patient has  taken Coumadin (warfarin), last dose was day of                            procedure. ASA Grade Assessment: III - A patient                            with severe systemic disease. After reviewing the                            risks and benefits, the patient was deemed in                            satisfactory condition to undergo the procedure.                           After obtaining informed  consent, the endoscope was                            passed under direct vision. Throughout the                            procedure, the patient's blood pressure, pulse, and                            oxygen saturations were monitored continuously. The                            Model GIF-[id number]) scope was introduced                            through the mouth, and advanced to the second part                            of duodenum. The upper GI endoscopy was                            accomplished without difficulty. The patient                            tolerated the procedure well. Scope In: Scope Out: Findings:                 Esophagogastric landmarks were identified: the                            Z-line was found at 40 cm, the gastroesophageal                            junction was found at 40 cm and the upper extent of                            the gastric folds was found at 40 cm from the  incisors.                           The exam of the esophagus was otherwise normal.                            Interval healing of esophagitis and candidiasis has                            occured on PPI. No stenosis / stricture                            appreciated. No esophageal varices.                           Diffuse mildly erythematous mucosa was found in the                            entire examined stomach, perhaps due to portal                            hypertension.                           The exam of the stomach was otherwise normal.                            Interval healin g of gastric ulcer. Diminutive                            benign appearing polyp of the fundus noted.                            Biopsies not taken of the stomach due recent                            negative testing for H pylori on last endoscopy.                           The duodenal bulb and second portion of the                            duodenum were  normal. Complications:            No immediate complications. Estimated blood loss:                            None. Estimated Blood Loss:     Estimated blood loss: none. Impression:               - Esophagogastric landmarks identified.                           - Normal esophagus - healing of esophagitis, no  varices appreciated.                           - Erythematous mucosa in the stomach, suspect due                            to portal hypertension.                           - Normal stomach otherwise                           - Normal duodenal bulb and second portion of the                            duodenum. Recommendation:           - Patient has a contact number available for                            emergencies. The signs and symptoms of potential                            delayed complications were discussed with the                            patient. Return to normal activities tomorrow.                            Written discharge instructions were provided to the                            patient.                           - Resume previous diet.                           - Continue present medications including Coumadin                           - Decrease nexium to once daily Abigale Dorow P. Denna Fryberger, MD 04/09/2018 10:42:52 AM This report has been signed electronically.

## 2018-04-09 NOTE — Progress Notes (Signed)
Report given to PACU, vss 

## 2018-04-10 ENCOUNTER — Telehealth: Payer: Self-pay | Admitting: *Deleted

## 2018-04-10 DIAGNOSIS — I482 Chronic atrial fibrillation, unspecified: Secondary | ICD-10-CM | POA: Diagnosis not present

## 2018-04-10 DIAGNOSIS — I2510 Atherosclerotic heart disease of native coronary artery without angina pectoris: Secondary | ICD-10-CM | POA: Diagnosis not present

## 2018-04-10 DIAGNOSIS — I5030 Unspecified diastolic (congestive) heart failure: Secondary | ICD-10-CM | POA: Diagnosis not present

## 2018-04-10 DIAGNOSIS — M8580 Other specified disorders of bone density and structure, unspecified site: Secondary | ICD-10-CM | POA: Diagnosis not present

## 2018-04-10 DIAGNOSIS — N182 Chronic kidney disease, stage 2 (mild): Secondary | ICD-10-CM | POA: Diagnosis not present

## 2018-04-10 DIAGNOSIS — I252 Old myocardial infarction: Secondary | ICD-10-CM | POA: Diagnosis not present

## 2018-04-10 DIAGNOSIS — I130 Hypertensive heart and chronic kidney disease with heart failure and stage 1 through stage 4 chronic kidney disease, or unspecified chronic kidney disease: Secondary | ICD-10-CM | POA: Diagnosis not present

## 2018-04-10 DIAGNOSIS — Z86010 Personal history of colonic polyps: Secondary | ICD-10-CM | POA: Diagnosis not present

## 2018-04-10 DIAGNOSIS — L97522 Non-pressure chronic ulcer of other part of left foot with fat layer exposed: Secondary | ICD-10-CM | POA: Diagnosis not present

## 2018-04-10 DIAGNOSIS — K5900 Constipation, unspecified: Secondary | ICD-10-CM | POA: Diagnosis not present

## 2018-04-10 DIAGNOSIS — H547 Unspecified visual loss: Secondary | ICD-10-CM | POA: Diagnosis not present

## 2018-04-10 DIAGNOSIS — I69393 Ataxia following cerebral infarction: Secondary | ICD-10-CM | POA: Diagnosis not present

## 2018-04-10 DIAGNOSIS — K259 Gastric ulcer, unspecified as acute or chronic, without hemorrhage or perforation: Secondary | ICD-10-CM | POA: Diagnosis not present

## 2018-04-10 DIAGNOSIS — Z9181 History of falling: Secondary | ICD-10-CM | POA: Diagnosis not present

## 2018-04-10 DIAGNOSIS — F419 Anxiety disorder, unspecified: Secondary | ICD-10-CM | POA: Diagnosis not present

## 2018-04-10 DIAGNOSIS — D631 Anemia in chronic kidney disease: Secondary | ICD-10-CM | POA: Diagnosis not present

## 2018-04-10 DIAGNOSIS — Z87440 Personal history of urinary (tract) infections: Secondary | ICD-10-CM | POA: Diagnosis not present

## 2018-04-10 DIAGNOSIS — E782 Mixed hyperlipidemia: Secondary | ICD-10-CM | POA: Diagnosis not present

## 2018-04-10 DIAGNOSIS — H3530 Unspecified macular degeneration: Secondary | ICD-10-CM | POA: Diagnosis not present

## 2018-04-10 DIAGNOSIS — K210 Gastro-esophageal reflux disease with esophagitis: Secondary | ICD-10-CM | POA: Diagnosis not present

## 2018-04-10 DIAGNOSIS — M1991 Primary osteoarthritis, unspecified site: Secondary | ICD-10-CM | POA: Diagnosis not present

## 2018-04-10 DIAGNOSIS — Z7901 Long term (current) use of anticoagulants: Secondary | ICD-10-CM | POA: Diagnosis not present

## 2018-04-10 NOTE — Telephone Encounter (Signed)
No answer for post procedure call back. Left message and will attempt to call back later this afternoon. SM 

## 2018-04-10 NOTE — Telephone Encounter (Signed)
  Follow up Call-  Call back number 04/09/2018  Post procedure Call Back phone  # [phone]  Permission to leave phone message Yes  Some recent data might be hidden     Patient questions:  Do you have a fever, pain , or abdominal swelling? No. Pain Score  0 *  Have you tolerated food without any problems? Yes.    Have you been able to return to your normal activities? Yes.    Do you have any questions about your discharge instructions: Diet   No. Medications  No. Follow up visit  No.    Do you have questions or concerns about your Care? No.  Actions: * If pain score is 4 or above: No action needed, pain <4.

## 2018-04-10 NOTE — Telephone Encounter (Signed)
Called back to advice the office that she is doing good after procedure. A little slower but still doing good.

## 2018-04-12 ENCOUNTER — Ambulatory Visit (INDEPENDENT_AMBULATORY_CARE_PROVIDER_SITE_OTHER): Payer: Medicare Other

## 2018-04-12 ENCOUNTER — Encounter (HOSPITAL_BASED_OUTPATIENT_CLINIC_OR_DEPARTMENT_OTHER): Payer: Medicare Other | Attending: Internal Medicine

## 2018-04-12 DIAGNOSIS — Y838 Other surgical procedures as the cause of abnormal reaction of the patient, or of later complication, without mention of misadventure at the time of the procedure: Secondary | ICD-10-CM | POA: Diagnosis not present

## 2018-04-12 DIAGNOSIS — I482 Chronic atrial fibrillation, unspecified: Secondary | ICD-10-CM | POA: Insufficient documentation

## 2018-04-12 DIAGNOSIS — I2510 Atherosclerotic heart disease of native coronary artery without angina pectoris: Secondary | ICD-10-CM | POA: Diagnosis not present

## 2018-04-12 DIAGNOSIS — S60221A Contusion of right hand, initial encounter: Secondary | ICD-10-CM | POA: Insufficient documentation

## 2018-04-12 DIAGNOSIS — L97522 Non-pressure chronic ulcer of other part of left foot with fat layer exposed: Secondary | ICD-10-CM | POA: Diagnosis not present

## 2018-04-12 DIAGNOSIS — Z5181 Encounter for therapeutic drug level monitoring: Secondary | ICD-10-CM | POA: Diagnosis not present

## 2018-04-12 DIAGNOSIS — I130 Hypertensive heart and chronic kidney disease with heart failure and stage 1 through stage 4 chronic kidney disease, or unspecified chronic kidney disease: Secondary | ICD-10-CM | POA: Diagnosis not present

## 2018-04-12 DIAGNOSIS — I5030 Unspecified diastolic (congestive) heart failure: Secondary | ICD-10-CM | POA: Diagnosis not present

## 2018-04-12 DIAGNOSIS — W19XXXA Unspecified fall, initial encounter: Secondary | ICD-10-CM | POA: Diagnosis not present

## 2018-04-12 DIAGNOSIS — Z7901 Long term (current) use of anticoagulants: Secondary | ICD-10-CM | POA: Diagnosis not present

## 2018-04-12 DIAGNOSIS — D631 Anemia in chronic kidney disease: Secondary | ICD-10-CM | POA: Diagnosis not present

## 2018-04-12 DIAGNOSIS — N182 Chronic kidney disease, stage 2 (mild): Secondary | ICD-10-CM | POA: Diagnosis not present

## 2018-04-12 DIAGNOSIS — F0390 Unspecified dementia without behavioral disturbance: Secondary | ICD-10-CM | POA: Diagnosis not present

## 2018-04-12 DIAGNOSIS — Z952 Presence of prosthetic heart valve: Secondary | ICD-10-CM

## 2018-04-12 DIAGNOSIS — K7460 Unspecified cirrhosis of liver: Secondary | ICD-10-CM | POA: Insufficient documentation

## 2018-04-12 DIAGNOSIS — T8189XA Other complications of procedures, not elsewhere classified, initial encounter: Secondary | ICD-10-CM | POA: Insufficient documentation

## 2018-04-12 LAB — POCT INR: INR: 2.6 (ref 2.0–3.0)

## 2018-04-12 MED ORDER — WARFARIN SODIUM 5 MG PO TABS: ORAL_TABLET | ORAL | 2 refills | Status: DC

## 2018-04-12 NOTE — Patient Instructions (Signed)
Description   Spoke with Tiffany-RN [facility] while in pt's home and instructed to have pt continue taking 1 tablet daily except 1/2 tablet on Tuesdays and Saturdays. Recheck in 3 weeks.  Call if any changes [phone].

## 2018-04-17 DIAGNOSIS — I130 Hypertensive heart and chronic kidney disease with heart failure and stage 1 through stage 4 chronic kidney disease, or unspecified chronic kidney disease: Secondary | ICD-10-CM | POA: Diagnosis not present

## 2018-04-17 DIAGNOSIS — L97522 Non-pressure chronic ulcer of other part of left foot with fat layer exposed: Secondary | ICD-10-CM | POA: Diagnosis not present

## 2018-04-17 DIAGNOSIS — D631 Anemia in chronic kidney disease: Secondary | ICD-10-CM | POA: Diagnosis not present

## 2018-04-17 DIAGNOSIS — N182 Chronic kidney disease, stage 2 (mild): Secondary | ICD-10-CM | POA: Diagnosis not present

## 2018-04-17 DIAGNOSIS — I2510 Atherosclerotic heart disease of native coronary artery without angina pectoris: Secondary | ICD-10-CM | POA: Diagnosis not present

## 2018-04-17 DIAGNOSIS — I5030 Unspecified diastolic (congestive) heart failure: Secondary | ICD-10-CM | POA: Diagnosis not present

## 2018-04-18 ENCOUNTER — Ambulatory Visit: Payer: Medicare Other | Admitting: Internal Medicine

## 2018-04-18 DIAGNOSIS — L308 Other specified dermatitis: Secondary | ICD-10-CM | POA: Diagnosis not present

## 2018-04-19 ENCOUNTER — Ambulatory Visit (INDEPENDENT_AMBULATORY_CARE_PROVIDER_SITE_OTHER): Payer: Medicare Other | Admitting: Internal Medicine

## 2018-04-19 ENCOUNTER — Ambulatory Visit
Admission: RE | Admit: 2018-04-19 | Discharge: 2018-04-19 | Disposition: A | Discharge status: Home / Self Care | Payer: Medicare Other | Source: Ambulatory Visit | Attending: Family Medicine | Admitting: Family Medicine

## 2018-04-19 ENCOUNTER — Encounter: Payer: Self-pay | Admitting: Internal Medicine

## 2018-04-19 ENCOUNTER — Other Ambulatory Visit: Payer: Self-pay | Admitting: Family Medicine

## 2018-04-19 VITALS — BP 138/68 | HR 66 | Ht 61.0 in | Wt 125.2 lb

## 2018-04-19 DIAGNOSIS — E871 Hypo-osmolality and hyponatremia: Secondary | ICD-10-CM | POA: Diagnosis not present

## 2018-04-19 DIAGNOSIS — I5030 Unspecified diastolic (congestive) heart failure: Secondary | ICD-10-CM | POA: Diagnosis not present

## 2018-04-19 DIAGNOSIS — I2510 Atherosclerotic heart disease of native coronary artery without angina pectoris: Secondary | ICD-10-CM | POA: Diagnosis not present

## 2018-04-19 DIAGNOSIS — I130 Hypertensive heart and chronic kidney disease with heart failure and stage 1 through stage 4 chronic kidney disease, or unspecified chronic kidney disease: Secondary | ICD-10-CM | POA: Diagnosis not present

## 2018-04-19 DIAGNOSIS — D631 Anemia in chronic kidney disease: Secondary | ICD-10-CM | POA: Diagnosis not present

## 2018-04-19 DIAGNOSIS — R52 Pain, unspecified: Secondary | ICD-10-CM

## 2018-04-19 DIAGNOSIS — N182 Chronic kidney disease, stage 2 (mild): Secondary | ICD-10-CM | POA: Diagnosis not present

## 2018-04-19 DIAGNOSIS — M79641 Pain in right hand: Secondary | ICD-10-CM | POA: Diagnosis not present

## 2018-04-19 DIAGNOSIS — L97522 Non-pressure chronic ulcer of other part of left foot with fat layer exposed: Secondary | ICD-10-CM | POA: Diagnosis not present

## 2018-04-19 LAB — T4, FREE: FREE T4: 0.86 ng/dL (ref 0.60–1.60)

## 2018-04-19 LAB — TSH: TSH: 1.33 u[IU]/mL (ref 0.35–4.50)

## 2018-04-19 LAB — BASIC METABOLIC PANEL
BUN: 18 mg/dL (ref 6–23)
CO2: 24 meq/L (ref 19–32)
Calcium: 8.8 mg/dL (ref 8.4–10.5)
Chloride: 100 mEq/L (ref 96–112)
Creatinine, Ser: 0.84 mg/dL (ref 0.40–1.20)
GFR: 67.86 mL/min (ref >60.00)
Glucose, Bld: 93 mg/dL (ref 70–99)
Potassium: 4.3 mEq/L (ref 3.5–5.1)
Sodium: 132 mEq/L — ABNORMAL LOW (ref 135–145)

## 2018-04-19 MED ORDER — PANTOPRAZOLE SODIUM 20 MG PO TBEC: 20.0000 mg | DELAYED_RELEASE_TABLET | Freq: Every day | ORAL | 0 refills | Status: DC

## 2018-04-19 NOTE — Progress Notes (Signed)
Name: Brittany [name]  MRN/ DOB: [id number], 1927-06-12    Age/ Sex: 85 y.o., female    PCP: Brittany Neer, MD   Reason for Endocrinology Evaluation: Hyponatremia     Date of Initial Endocrinology Evaluation: 04/19/2018     HPI: [name] is a 85 y.o. female with a past medical history of Aortic valve replacement, suspected cirrhosis , chronic pain syndrome and CVA. The patient presented for initial endocrinology clinic visit on 04/19/2018 for consultative assistance with her hyponatremia .   Pt noted with hyponatremia intermittently since 2013 but this has become more persistent in 2016.   She has chronic nausea and takes zofran that has been attributed to GI issues (gastritis) She also has chronic pain syndrome and is on chronic valium and opiate use for years. She takes Norco on average twice a day.   She is on PPI therapy for years.  There is a questionable diagnosis of cirrhosis in her history. She does have occasional LE edema and uses torsemide PRN , last use was 2 days ago.   She also has noted loss of balance lately and increased fall , and had been using the walker on regular basis.    HISTORY:  Past Medical History:  Past Medical History:  Diagnosis Date  . Abnormal liver diagnostic imaging    suspected cirrhosis  . Arthritis   . Atrial fibrillation ([place])   . Chronic back pain   . Diverticulosis   . Dyslipidemia    takes Lipitor daily  . Dysrhythmia    Atrial Fibrillation  . Foot drop    SINCE 1987  . GERD (gastroesophageal reflux disease)   . H/O hiatal hernia   . Hearing impaired    Bilateral hearing aids  . History of bacterial endocarditis   . History of gastritis   . History of stomach ulcers    many yrs ago  . History of TIA (transient ischemic attack)    2013-- RESIDUAL PERIPHERAL VISION RIGHT EYE--  RESOLVED  . Hypertension   . IC (interstitial cystitis)   . Macular degeneration   . Pulmonary hypertension ([place]) 10/2013   Based on TTE   . S/P aortic valve replacement    2005  . Urine incontinence    Past Surgical History:  Past Surgical History:  Procedure Laterality Date  . ABDOMINAL HYSTERECTOMY  1967  . AORTIC VALVE REPLACEMENT  09-29-2003    [facility] pericardial tissue valve  . APPENDECTOMY  1933  . BIOPSY  02/01/2018   Procedure: BIOPSY;  Surgeon: Rush Landmark Telford Nab., MD;  Location: [place];  Service: Gastroenterology;;  . CARDIAC CATHETERIZATION  07-15-2003 DR HELEN PRESTON   MODERATE TO MODERATELY SEVERE CALCIFIC AORTIC STENOSIS/  NORMAL CORONARY ARTERIES  . CARDIOVASCULAR STRESS TEST  01-30-2012  DR NASHER   NORMAL NUCLEAR STUDY/  EF 73%/  NORMAL LVF  . CARPAL TUNNEL RELEASE Bilateral   . CATARACT EXTRACTION W/ INTRAOCULAR LENS  IMPLANT, BILATERAL    . CHOLECYSTECTOMY  1993  . CYSTO WITH HYDRODISTENSION N/A 02/05/2013   Procedure: CYSTOSCOPY/HYDRODISTENSION;  Surgeon: Irine Seal, MD;  Location: [facility];  Service: Urology;  Laterality: N/A;  . CYSTO/ HYDRODISTENTION/ INSTILLATION CLORPACTIN  MULTIPLE  last one 2009  . DILATION AND CURETTAGE OF UTERUS    . ERCP N/A 08/10/2012   Procedure: ENDOSCOPIC RETROGRADE CHOLANGIOPANCREATOGRAPHY (ERCP);  Surgeon: Inda Castle, MD;  Location: [place];  Service: Gastroenterology;  Laterality: N/A;  . ESOPHAGOGASTRODUODENOSCOPY (EGD)  WITH PROPOFOL N/A 08/18/2014   Procedure: ESOPHAGOGASTRODUODENOSCOPY (EGD) WITH PROPOFOL;  Surgeon: Inda Castle, MD;  Location: [facility];  Service: Endoscopy;  Laterality: N/A;  . ESOPHAGOGASTRODUODENOSCOPY (EGD) WITH PROPOFOL N/A 02/01/2018   Procedure: ESOPHAGOGASTRODUODENOSCOPY (EGD) WITH PROPOFOL;  Surgeon: Rush Landmark Telford Nab., MD;  Location: [place];  Service: Gastroenterology;  Laterality: N/A;  . FOREIGN BODY REMOVAL  02/01/2018   Procedure: FOREIGN BODY REMOVAL;  Surgeon: Rush Landmark Telford Nab., MD;  Location: [place];  Service: Gastroenterology;;  . LEFT HEART  CATHETERIZATION WITH CORONARY ANGIOGRAM N/A 10/18/2013   Procedure: LEFT HEART CATHETERIZATION WITH CORONARY ANGIOGRAM;  Surgeon: Jettie Booze, MD;  Location: [facility] C[facility];  Service: Cardiovascular;  Laterality: N/A;  . LUMBAR [place] &  2007  . MACROPLASTIQUE URETHRAL IMPLANTATION  08-27-2009  . MINI-OPEN RIGHT ROTATOR CUFF REPAIR  07-12-2001  . RIGHT SHOULDER ARTHROSCOPY W/ DEBRIDEMENT ROTATOR CUFF AND LABRAL TEAR/ ACROMINOPLASTY/ DISTAL CLAVICLE EXCISION/ CA LIGAMENT RELEASE  10-08-1999  . TONSILLECTOMY AND ADENOIDECTOMY    . TRANSTHORACIC ECHOCARDIOGRAM  08-10-2011   MILD LVH/  EF 55-60%/  NORMAL AVR TISSUE/ MILD MR/  MODERATE DILATED LA/  MILD DILATED RA  . TRANSVAGINAL TAPE PROCEDURE  07-30-2002  . VEIN LIGATION  1969   RIGHT LOWER LEG      Social History:  reports that she quit smoking about 28 years ago. Her smoking use included cigarettes. She started smoking about 69 years ago. She has a 20.00 pack-year smoking history. She has never used smokeless tobacco. She reports that she does not drink alcohol or use drugs.  Family History: family history includes Alcohol abuse in her sister and sister; Bipolar disorder in her brother and son; Colon cancer in her maternal aunt; Coronary artery disease in her brother; Esophageal cancer in her maternal grandfather; Heart disease in her father; Stomach cancer in her maternal grandmother.   HOME MEDICATIONS: Allergies as of 04/19/2018      Reactions   Amlodipine Swelling, Other (See Comments)    Unspecified swelling reaction   Amoxicillin Diarrhea, Nausea And Vomiting, Other (See Comments)   Has patient had a PCN reaction causing immediate rash, facial/tongue/throat swelling, SOB or lightheadedness with hypotension: No Has patient had a PCN reaction causing severe rash involving mucus membranes or skin necrosis: No Has patient had a PCN reaction that required hospitalization: No Has patient had a PCN reaction occurring within  the last 10 years: No If all of the above answers are "NO", then may proceed with Cephalosporin use.   Carafate [sucralfate] Swelling, Other (See Comments)   Reaction:  Knee swelling/redness    Cephalexin Diarrhea   Codeine Nausea And Vomiting   Irbesartan Swelling, Other (See Comments)   Reaction:  Facial/hand swelling and numbness    Morphine And Related Other (See Comments)   Pt states that she has a history of addiction with this medication.       Neurontin [gabapentin] Swelling, Other (See Comments)   Reaction:  Leg swelling    Nitrofurantoin Monohyd Macro Diarrhea, Nausea And Vomiting   Prednisone Other (See Comments)   Reaction:  Elevated BP   Sulfa Antibiotics Rash      Medication List       Accurate as of April 19, 2018  4:18 PM. Always use your most recent med list.        atorvastatin 20 MG tablet Commonly known as:  LIPITOR Take 20 mg by mouth daily.   busPIRone 15 MG tablet Commonly known as:  BUSPAR Take 1 tablet (15 mg total) by mouth 2 (two) times daily.   cycloSPORINE 0.05 % ophthalmic emulsion Commonly known as:  RESTASIS Place 1 drop into both eyes 2 (two) times daily.   diazepam 5 MG tablet Commonly known as:  VALIUM Take 5 mg by mouth at bedtime as needed (sleep).   diltiazem 120 MG tablet Commonly known as:  CARDIZEM Take 120 mg by mouth 2 (two) times daily.   famotidine 40 MG tablet Commonly known as:  PEPCID Take 40 mg by mouth daily.   HYDROcodone-acetaminophen 5-325 MG tablet Commonly known as:  NORCO/VICODIN Take 1 tablet by mouth every 6 (six) hours as needed for severe pain.   hyoscyamine 0.125 MG tablet Commonly known as:  LEVSIN, ANASPAZ Take 0.125 mg by mouth every 4 (four) hours as needed for cramping.   ICAPS AREDS 2 Caps Take 1 capsule by mouth 2 (two) times daily.   lisinopril 40 MG tablet Commonly known as:  PRINIVIL,ZESTRIL Take 40 mg by mouth daily.   nitroGLYCERIN 0.4 MG SL tablet Commonly known as:   NITROSTAT Place 1 tablet (0.4 mg total) under the tongue every 5 (five) minutes as needed for chest pain.   ondansetron 8 MG tablet Commonly known as:  ZOFRAN Take 1 tablet (8 mg total) by mouth 2 (two) times daily.   oxybutynin 5 MG tablet Commonly known as:  DITROPAN Take 5-10 mg by mouth See admin instructions. Take 1 tablet (5 mg) by mouth every morning and 2 tablets (10 mg) at night   pantoprazole 20 MG tablet Commonly known as:  PROTONIX Take 1 tablet (20 mg total) by mouth daily.   polyethylene glycol packet Commonly known as:  MIRALAX / GLYCOLAX Take 17 g by mouth daily as needed for moderate constipation.   potassium chloride SA 20 MEQ tablet Commonly known as:  K-DUR,KLOR-CON Take 20 mEq by mouth daily as needed (ONLY WHEN TAKING TORSEMIDE).   REFRESH OP Place 1 drop into both eyes 2 (two) times daily.   torsemide 20 MG tablet Commonly known as:  DEMADEX Take 20 mg by mouth daily.   warfarin 5 MG tablet Commonly known as:  COUMADIN Take as directed by the anticoagulation clinic. If you are unsure how to take this medication, talk to your nurse or doctor. Original instructions:  Take as directed by Coumadin Clinic         REVIEW OF SYSTEMS: A comprehensive ROS was conducted with the patient and is negative except as per HPI and below:  Review of Systems  Constitutional: Negative for malaise/fatigue and weight loss.  HENT: Negative for congestion and sore throat.   Eyes: Positive for blurred vision. Negative for pain.  Respiratory: Negative for cough and shortness of breath.   Cardiovascular: Negative for chest pain and palpitations.  Gastrointestinal: Positive for nausea. Negative for diarrhea.  Genitourinary: Positive for frequency.  Musculoskeletal: Positive for back pain and falls.  Skin: Negative for itching and rash.  Neurological: Negative for tingling and tremors.  Endo/Heme/Allergies: Negative for polydipsia.  Psychiatric/Behavioral: Negative for  depression. The patient is not nervous/anxious.        OBJECTIVE:  VS: BP 138/68 (BP Location: Left Arm, Patient Position: Sitting, Cuff Size: Normal)   Pulse 66   Ht 5\' 1"  (1.549 m)   Wt 125 lb 3.2 oz (56.8 kg)   SpO2 98%   BMI 23.66 kg/m    Wt Readings from Last 3 Encounters:  04/19/18 125 lb 3.2 oz (56.8 kg)  04/09/18 123 lb (55.8 kg)  02/22/18 123 lb 6 oz (56 kg)     EXAM: General: Pt appears well and is in NAD  Hydration: Well-hydrated with moist mucous membranes and good skin turgor  Eyes: External eye exam normal without stare, lid lag or exophthalmos.  EOM intact.  PERRL.  Ears, Nose, Throat: Hearing: Grossly intact bilaterally Throat: Clear without mass, erythema or exudate  Neck: General: Supple without adenopathy. Thyroid: Thyroid size normal.  No goiter or nodules appreciated. No thyroid bruit.  Lungs: Clear with good BS bilat with no rales, rhonchi, or wheezes  Heart: Auscultation: RRR.  Abdomen: Soft, nontender, but distended  Skin: Hair: Texture and amount normal with gender appropriate distribution Skin Inspection: No rashes. Skin Palpation: Skin temperature, texture, and thickness normal to palpation  Neuro: Cranial nerves: II - XII grossly intact  No focal deficit  Mental Status: Judgment, insight: Intact Orientation: Oriented to time, place, and person Mood and affect: No depression, anxiety, or agitation     DATA REVIEWED: Results for GENIFER, LAZENBY (MRN [id number]) as of 04/19/2018 16:15  Ref. Range 08/27/2014 19:07 08/28/2014 05:40 08/29/2014 05:15 10/31/2014 14:44 07/06/2015 12:44 10/05/2015 15:16 02/01/2016 12:02 09/29/2016 15:28 09/29/2016 16:25 09/30/2016 04:20 10/01/2016 04:13 10/02/2016 04:24 07/13/2017 16:41 08/07/2017 15:36 01/29/2018 15:45 01/30/2018 08:14 01/31/2018 04:22 02/01/2018 04:25 02/02/2018 04:58  Sodium Latest Ref Range: 135 - 145 mmol/L 124 (L) 133 (L) 131 (L) 128 (L) 135 133 (L) 128 (L) 128 (L) 130 (L) 134 (L) 131 (L) 133 (L) 133 (L) 133 (L)  127 (L) 127 (L) 126 (L) 126 (L) 134 (L)     Abdominal Ultrasound 08/28/2014  Nodular contour of the left lobe of the liver. This is suggestive of but not diagnostic for cirrhosis. Hepatic elastography may be useful for confirmation of cirrhosis.   ASSESSMENT/PLAN/RECOMMENDATIONS:   1. Chronic Hyponatremia :    - Hyponatremia is caused by excess of water in relation to sodium. This could be related to increased water intake in the case of polydipsia or due to impaired water excretion such as when there's persistent ADH release .  - In review of her history  She has no evidence of extra solute in the serum.  Pt does not have lipemic syndrome nor abnormal thyroid function testing.  - Clinically she has no evidence of adrenal insufficiency.  - On exam today patient has evidence of peripheral edema based on 1+ ankle edema and abdominal distension. In 2016 there is a question about cirrhosis based on abdominal ultrasound imaging.  - Her hyponatremia is caused by systemic vasodilation that is caused by cirrhosis, vasodilation lowers the mean arterial pressure and stimulates ADH release.  - Of note the patient is on medications that could exacerbate hyponatremia such as opiates and PPI's.  - I will defer further treatment of hyponatremia secondary to cirrhosis to the discretion of her PCP.     F/U PRN    Signed electronically by: Mack Guise, MD  [facility] Endocrinology  [address] [place], [place] [address] Phone: [phone] FAX: [phone]   CC: Brittany Neer, MD [place] [address] [place] [address] Phone: [phone] Fax: [phone]   Return to Endocrinology clinic as below: Future Appointments  Date Time Provider [place]  04/24/2018  2:20 PM Nahser, Wonda Cheng, MD [facility]  06/06/2018  1:30 PM Collene Gobble, MD [facility] None  09/27/2018  9:15 AM Hayden Pedro, MD  [facility] None

## 2018-04-19 NOTE — Patient Instructions (Signed)
-   Please stop by the lab today. We will contact you with the results.

## 2018-04-23 DIAGNOSIS — H3530 Unspecified macular degeneration: Secondary | ICD-10-CM | POA: Diagnosis not present

## 2018-04-23 DIAGNOSIS — N182 Chronic kidney disease, stage 2 (mild): Secondary | ICD-10-CM | POA: Diagnosis not present

## 2018-04-23 DIAGNOSIS — F411 Generalized anxiety disorder: Secondary | ICD-10-CM | POA: Diagnosis not present

## 2018-04-23 DIAGNOSIS — I130 Hypertensive heart and chronic kidney disease with heart failure and stage 1 through stage 4 chronic kidney disease, or unspecified chronic kidney disease: Secondary | ICD-10-CM | POA: Diagnosis not present

## 2018-04-23 DIAGNOSIS — E871 Hypo-osmolality and hyponatremia: Secondary | ICD-10-CM | POA: Diagnosis not present

## 2018-04-23 DIAGNOSIS — E46 Unspecified protein-calorie malnutrition: Secondary | ICD-10-CM | POA: Diagnosis not present

## 2018-04-23 DIAGNOSIS — J449 Chronic obstructive pulmonary disease, unspecified: Secondary | ICD-10-CM | POA: Diagnosis not present

## 2018-04-23 DIAGNOSIS — I69993 Ataxia following unspecified cerebrovascular disease: Secondary | ICD-10-CM | POA: Diagnosis not present

## 2018-04-23 DIAGNOSIS — K7460 Unspecified cirrhosis of liver: Secondary | ICD-10-CM | POA: Diagnosis not present

## 2018-04-23 LAB — OSMOLALITY: OSMOLALITY: 282 mosm/kg (ref 278–305)

## 2018-04-23 LAB — OSMOLALITY, URINE: OSMOLALITY UR: 394 mosm/kg (ref 50–1200)

## 2018-04-23 LAB — SODIUM, URINE, RANDOM: Sodium, Ur: 53 mmol/L (ref 28–272)

## 2018-04-23 LAB — EXTRA URINE SPECIMEN

## 2018-04-24 ENCOUNTER — Ambulatory Visit (INDEPENDENT_AMBULATORY_CARE_PROVIDER_SITE_OTHER): Payer: Medicare Other | Admitting: Cardiovascular Disease

## 2018-04-24 ENCOUNTER — Encounter: Payer: Self-pay | Admitting: Cardiovascular Disease

## 2018-04-24 VITALS — BP 140/70 | HR 70 | Ht 61.0 in | Wt 126.0 lb

## 2018-04-24 DIAGNOSIS — I5032 Chronic diastolic (congestive) heart failure: Secondary | ICD-10-CM

## 2018-04-24 DIAGNOSIS — N182 Chronic kidney disease, stage 2 (mild): Secondary | ICD-10-CM | POA: Diagnosis not present

## 2018-04-24 DIAGNOSIS — L97522 Non-pressure chronic ulcer of other part of left foot with fat layer exposed: Secondary | ICD-10-CM | POA: Diagnosis not present

## 2018-04-24 DIAGNOSIS — I5030 Unspecified diastolic (congestive) heart failure: Secondary | ICD-10-CM | POA: Diagnosis not present

## 2018-04-24 DIAGNOSIS — I350 Nonrheumatic aortic (valve) stenosis: Secondary | ICD-10-CM | POA: Diagnosis not present

## 2018-04-24 DIAGNOSIS — I2510 Atherosclerotic heart disease of native coronary artery without angina pectoris: Secondary | ICD-10-CM | POA: Diagnosis not present

## 2018-04-24 DIAGNOSIS — I130 Hypertensive heart and chronic kidney disease with heart failure and stage 1 through stage 4 chronic kidney disease, or unspecified chronic kidney disease: Secondary | ICD-10-CM | POA: Diagnosis not present

## 2018-04-24 DIAGNOSIS — D631 Anemia in chronic kidney disease: Secondary | ICD-10-CM | POA: Diagnosis not present

## 2018-04-24 NOTE — Progress Notes (Signed)
Cardiology Office Note   ID:  Brittany [name], Group [birth year], MRN [id number]  PCP:  Mayra Neer, MD  Cardiologist:   Mertie Moores, MD   Chief Complaint  Patient presents with  . Congestive Heart Failure  . Atrial Fibrillation   1. Aortic valve replacement 2. Dyslipidemia 3. Hypertension 4. 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  5. Atrial fibrillation   Brittany [name] is an 85 yo with aortic valve replacement, HTN, and hyperlipidemia. She has not had any cardiac complaints recently. She complains of easy bruising on her arms.   She had an episode of severe heart burn this past week.   She's had a stroke over the past year. This has left her with some balance issues and some loss of peripheral vision.  Nov. 4, 2014:  Brittany [name] is doing well from a cardiac standpoint. She is having problems with interstitial cystitis. She had a procedure last week and had a small bladder tear which they are letting heal. She is still having some significant pain.   Aug 27, 2013:  Brittany [name] is doing OK. She is not having any problems . Still very active.  She had an AVR   She is having problems with interstitial cyctitis.   November 06, 2013:  Brittany [name] was admitted to the hospital twice on July , 2015: DC summary :  85 year old Caucasian female with past medical history significant for hypertension, hyperlipidemia, history of CVA permanent atrial fibrillation on Xarelto for the last 2 years, and history of aortic valve replacement with bioprosthetic valve in 2005, here with chest pain. She was recently admitted to the hospital for management of malignant HTN , diastolic HF and headache. She was seen by Dr Haroldine Laws and [place] on 10/16/2013. She returns within 24 hrs for symptoms of chest pain . Pt states that earlier in the afternoon while she was trying to sleep post lunch she started experiencing soreness , heaviness "someone sitting on my chest" on the left side  radiating to left arm with associated mild SOB. This improved with SL NTG , however, she continues to have some chest pain. Pt denied any orthopnea, PND , LE edema , syncope, claudication , palpitation etc . She reports medication compliance. She bruises easily in her skin and once episodes of epistaxis several months ago.  She was admitted. Xarelto was held in anticipation of coronary angiography. ASA, statin, BB, IV NTG and heparin added. She ruled in for NSTEMI with troponin of 0.63. Left heart cath revealed severe, fibrotic disease in the ostial right coronary artery which was the culprit lesion. This was successfully stented with a 2.5 x 14 resolute drug-eluting stent, postdilated to 3.3 mm in diameter. She was started on plavix, however, a P2Y12 came back at 289 so she was started on Brilinta. Cardizem cd 120 was added for afib RVR with improvement. Due to mild cough Lisinopril was changed to irbesartan. Xarelto was changed to coumadin and she will follow up two days after DC in the coumadin clinic   She now presents for follow up . She has some MSK pain ( hurts when she presses her chest. She has had some bleeding. She woke up in the morning with a small skin tear on her right ankle . There was lots of blood in the bed. The bleeding eventually stopped with pressure. She also has had a nose bleed.   She has not had any chest pain and her energy levels have  been better. INR levels have been OK.   Oct. 29, 2015:  Brittany.  [name] feeling better. She had a bad viral illness last week and thinks that she might have the flu. She is getting better.  She's had some problems with anemia and she is turned in 3 sets of guaiac cards. She has had some dyspnea and was started on Lasix on a QOD schedule.  She is now off Brilinta. She had a major bleeding from he right foot. She was on brilinta because She had an inadequate response to plavix originally but later we discovered that she was also  on Omeprazole. We have stopped the omeprazone and her PRU is better. She is taking Nexium instead.   Off Eliquis. Is back on warfarin. doing well     June 20, 2014:  Brittany [name] is a 85 y.o. female who presents for follow up .  She has had prolonged nausea and abdominal pain.  Worse with eating. No cardiac symptoms .   Sept. 8, 2016:    Doing well from a cardiac issue.  has been diagnosed with COPD ( was a former smoker, 25 years ago )  Still has some stomach issues.   Is still weak but is gradually improving  Having intermittant nose bleeds - is on Plavix and couamdin  June 18, 2015:  Doing ok from a cardiac standpoint Walks with the walker - has some leg edema .  Can use a cane when she is in her house Avoids eating salt-does not eat out much .     June 26, [address]:  Brittany [name] has been complaining of increasing shortness of breath / cough over the past several weeks.    We have backed off her diuretic because of the concern for renal insufficiency.   Her previous lasix dose was 20 mg on Mondays and Thursday.     Last week she called Sharyn Lull and we increased her Lasix.  40 mg a day for 2-3 days. She seems to be feeling quite a bit better.  She's lost 7 pounds. She is back close to normal .    Cough is better,   Still short of breath   Oct. 4, 2017:  Brittany [name] is seen back today  Still has shortness of breath, especially after she is rushing around .  Has had low salt level and her lasix was decreased and she has been limiting her free water intake.   Aug 09, 2016:  Brittany [name] is seen back for follow up for her CAD, HTN, atrial fib  and chronic diastolic CHF  No active cardiac issues  July 13, 2017:  Seen today as a work in visit . Has very swollen and erythematous legs .  She has had swollen legs for the past several weeks.  We have doubled her Lasix.  She did not notice any increase in urination with doubling of the Lasix. The legs have continued to become  more more red and more painful.  She was wearing High support hose.  She has a deep ring around the calf of her leg representing edema.  She takes Lasix on a PRN basis - based on weight  Does not put out much urine even when she takes the lasix   No CP or dyspnea.    She has gained 10 lbs of fluid over the past 3-4 weeks   November 06, 2017:  Brittany [name] is seen today for follow-up visit of her acute on  chronic diastolic congestive heart failure and atrial fibrillation. She had lots of edema when I last saw her.  We changed the furosemide to torsemide and this seems to be working great.  No chest pain or shortness of breath.  Her energy level seem to be stable.  Has bilateral tingling in her fingertips.  May be related to waking and holding on to her walker .   April 24, 2018: Brittany [name] is seen with caregiver, Maudie Mercury .   Follow-up for her chronic diastolic congestive heart failure and atrial fibrillation.  Has a nonhealing wound on her left foot.   Has the wound care nurse sees her   The edema in her legs has largely resolved.    She now takes the Torsemide and Kdur when her legs do not resolve with leg elevation      Past Medical History:  Diagnosis Date  . Abnormal liver diagnostic imaging    suspected cirrhosis  . Arthritis   . Atrial fibrillation ([place])   . Chronic back pain   . Diverticulosis   . Dyslipidemia    takes Lipitor daily  . Dysrhythmia    Atrial Fibrillation  . Foot drop    SINCE 1987  . GERD (gastroesophageal reflux disease)   . H/O hiatal hernia   . Hearing impaired    Bilateral hearing aids  . History of bacterial endocarditis   . History of gastritis   . History of stomach ulcers    many yrs ago  . History of TIA (transient ischemic attack)    2013-- RESIDUAL PERIPHERAL VISION RIGHT EYE--  RESOLVED  . Hypertension   . IC (interstitial cystitis)   . Macular degeneration   . Pulmonary hypertension ([place]) 10/2013   Based on TTE  . S/P aortic valve  replacement    2005  . Urine incontinence     Past Surgical History:  Procedure Laterality Date  . ABDOMINAL HYSTERECTOMY  1967  . AORTIC VALVE REPLACEMENT  09-29-2003    [facility] pericardial tissue valve  . APPENDECTOMY  1933  . BIOPSY  02/01/2018   Procedure: BIOPSY;  Surgeon: Rush Landmark Telford Nab., MD;  Location: [facility];  Service: Gastroenterology;;  . CARDIAC CATHETERIZATION  07-15-2003 DR HELEN PRESTON   MODERATE TO MODERATELY SEVERE CALCIFIC AORTIC STENOSIS/  NORMAL CORONARY ARTERIES  . CARDIOVASCULAR STRESS TEST  01-30-2012  DR NASHER   NORMAL NUCLEAR STUDY/  EF 73%/  NORMAL LVF  . CARPAL TUNNEL RELEASE Bilateral   . CATARACT EXTRACTION W/ INTRAOCULAR LENS  IMPLANT, BILATERAL    . CHOLECYSTECTOMY  1993  . CYSTO WITH HYDRODISTENSION N/A 02/05/2013   Procedure: CYSTOSCOPY/HYDRODISTENSION;  Surgeon: Irine Seal, MD;  Location: [facility];  Service: Urology;  Laterality: N/A;  . CYSTO/ HYDRODISTENTION/ INSTILLATION CLORPACTIN  MULTIPLE  last one 2009  . DILATION AND CURETTAGE OF UTERUS    . ERCP N/A 08/10/2012   Procedure: ENDOSCOPIC RETROGRADE CHOLANGIOPANCREATOGRAPHY (ERCP);  Surgeon: Inda Castle, MD;  Location: [facility];  Service: Gastroenterology;  Laterality: N/A;  . ESOPHAGOGASTRODUODENOSCOPY (EGD) WITH PROPOFOL N/A 08/18/2014   Procedure: ESOPHAGOGASTRODUODENOSCOPY (EGD) WITH PROPOFOL;  Surgeon: Inda Castle, MD;  Location: [facility];  Service: Endoscopy;  Laterality: N/A;  . ESOPHAGOGASTRODUODENOSCOPY (EGD) WITH PROPOFOL N/A 02/01/2018   Procedure: ESOPHAGOGASTRODUODENOSCOPY (EGD) WITH PROPOFOL;  Surgeon: Rush Landmark Telford Nab., MD;  Location: [facility];  Service: Gastroenterology;  Laterality: N/A;  . FOREIGN BODY REMOVAL  02/01/2018   Procedure: FOREIGN BODY REMOVAL;  Surgeon: Justice Britain  Brooke Bonito., MD;  Location: [facility];  Service: Gastroenterology;;  . LEFT HEART CATHETERIZATION WITH CORONARY ANGIOGRAM N/A 10/18/2013    Procedure: LEFT HEART CATHETERIZATION WITH CORONARY ANGIOGRAM;  Surgeon: Jettie Booze, MD;  Location: [facility] CATH [facility];  Service: Cardiovascular;  Laterality: N/A;  . LUMBAR [place] &  2007  . MACROPLASTIQUE URETHRAL IMPLANTATION  08-27-2009  . MINI-OPEN RIGHT ROTATOR CUFF REPAIR  07-12-2001  . RIGHT SHOULDER ARTHROSCOPY W/ DEBRIDEMENT ROTATOR CUFF AND LABRAL TEAR/ ACROMINOPLASTY/ DISTAL CLAVICLE EXCISION/ CA LIGAMENT RELEASE  10-08-1999  . TONSILLECTOMY AND ADENOIDECTOMY    . TRANSTHORACIC ECHOCARDIOGRAM  08-10-2011   MILD LVH/  EF 55-60%/  NORMAL AVR TISSUE/ MILD MR/  MODERATE DILATED LA/  MILD DILATED RA  . TRANSVAGINAL TAPE PROCEDURE  07-30-2002  . VEIN LIGATION  1969   RIGHT LOWER LEG     Current Outpatient Medications  Medication Sig Dispense Refill  . atorvastatin (LIPITOR) 20 MG tablet Take 20 mg by mouth daily.    . busPIRone (BUSPAR) 15 MG tablet Take 1 tablet (15 mg total) by mouth 2 (two) times daily. 180 tablet 2  . cycloSPORINE (RESTASIS) 0.05 % ophthalmic emulsion Place 1 drop into both eyes 2 (two) times daily.    . diazepam (VALIUM) 5 MG tablet Take 5 mg by mouth at bedtime as needed (sleep).     Marland Kitchen diltiazem (CARDIZEM) 120 MG tablet Take 120 mg by mouth 2 (two) times daily.    . famotidine (PEPCID) 40 MG tablet Take 40 mg by mouth daily.    Marland Kitchen HYDROcodone-acetaminophen (NORCO/VICODIN) 5-325 MG tablet Take 1 tablet by mouth every 6 (six) hours as needed for severe pain. 14 tablet 0  . hyoscyamine (LEVSIN, ANASPAZ) 0.125 MG tablet Take 0.125 mg by mouth every 4 (four) hours as needed for cramping.     Marland Kitchen lisinopril (PRINIVIL,ZESTRIL) 40 MG tablet Take 40 mg by mouth daily.     . Multiple Vitamins-Minerals (ICAPS AREDS 2) CAPS Take 1 capsule by mouth 2 (two) times daily.    . nitroGLYCERIN (NITROSTAT) 0.4 MG SL tablet Place 1 tablet (0.4 mg total) under the tongue every 5 (five) minutes as needed for chest pain. 30 tablet 0  . ondansetron (ZOFRAN) 8 MG tablet Take  1 tablet (8 mg total) by mouth 2 (two) times daily. 90 tablet 1  . oxybutynin (DITROPAN) 5 MG tablet Take 5-10 mg by mouth See admin instructions. Take 1 tablet (5 mg) by mouth every morning and 2 tablets (10 mg) at night    . pantoprazole (PROTONIX) 20 MG tablet Take 1 tablet (20 mg total) by mouth daily. 30 tablet 0  . polyethylene glycol (MIRALAX / GLYCOLAX) packet Take 17 g by mouth daily as needed for moderate constipation. 14 each 0  . Polyvinyl Alcohol-Povidone (REFRESH OP) Place 1 drop into both eyes 2 (two) times daily.    . potassium chloride SA (K-DUR,KLOR-CON) 20 MEQ tablet Take 20 mEq by mouth daily as needed (ONLY WHEN TAKING TORSEMIDE).    Marland Kitchen torsemide (DEMADEX) 20 MG tablet Take 20 mg by mouth daily.     Marland Kitchen warfarin (COUMADIN) 5 MG tablet Take as directed by Coumadin Clinic 30 tablet 2   Current Facility-Administered Medications  Medication Dose Route Frequency Provider Last Rate Last Dose  . 0.9 %  sodium chloride infusion  500 mL Intravenous Once Armbruster, Carlota Raspberry, MD        Allergies:   Amlodipine; Amoxicillin; Carafate [sucralfate]; Cephalexin; Codeine; Irbesartan; Morphine and  related; Neurontin [gabapentin]; Nitrofurantoin monohyd macro; Prednisone; and Sulfa antibiotics    Social History:  The patient  reports that she quit smoking about 28 years ago. Her smoking use included cigarettes. She started smoking about 69 years ago. She has a 20.00 pack-year smoking history. She has never used smokeless tobacco. She reports that she does not drink alcohol or use drugs.   Family History:  The patient's family history includes Alcohol abuse in her sister and sister; Bipolar disorder in her brother and son; Colon cancer in her maternal aunt; Coronary artery disease in her brother; Esophageal cancer in her maternal grandfather; Heart disease in her father; Stomach cancer in her maternal grandmother.    ROS: Noted in current history.  Otherwise review of systems is  negative.   Physical Exam: Blood pressure 140/70, pulse 70, height 5\' 1"  (1.549 m), weight 126 lb (57.2 kg), SpO2 99 %.  GEN: Female, no acute distress walks with the assistance of a walker HEENT: Normal NECK: No JVD; No carotid bruits LYMPHATICS: No lymphadenopathy CARDIAC: Irregularly irregular RESPIRATORY:  Clear to auscultation without rales, wheezing or rhonchi  ABDOMEN: Soft, non-tender, non-distended MUSCULOSKELETAL:  No edema; No deformity  SKIN: Warm and dry NEUROLOGIC:  Alert and oriented x 3    EKG:     Recent Labs: 02/02/2018: Hemoglobin 10.9; Magnesium 1.8; Platelets 179 04/19/2018: BUN 18; Creatinine, Ser 0.84; Potassium 4.3; Sodium 132; TSH 1.33    Lipid Panel    Component Value Date/Time   CHOL 159 10/12/2013 0525   TRIG 53 10/12/2013 0525   HDL 62 10/12/2013 0525   CHOLHDL 2.6 10/12/2013 0525   VLDL 11 10/12/2013 0525   LDLCALC 86 10/12/2013 0525      Wt Readings from Last 3 Encounters:  04/24/18 126 lb (57.2 kg)  04/19/18 125 lb 3.2 oz (56.8 kg)  04/09/18 123 lb (55.8 kg)      Other studies Reviewed: Additional studies/ records that were reviewed today include: records from medical doctor. Review of the above records demonstrates: several months of abdominal pain    ASSESSMENT AND PLAN:  1.  Leg swelling.   Largely resolved.  She elevates her legs regularly.  She takes torsemide and potassium on an as-needed basis for leg swelling.   1. Aortic valve replacement -  Valve sounds great.  2. Dyslipidemia-   3. Hypertension- .  Blood pressure is well controlled.  4. CVA -     5. Atrial fibrillation-   rate is well controlled.  Continue Coumadin.    Current medicines are reviewed at length with the patient today.  The patient does not have concerns regarding medicines.  The following changes have been made:  no change  Disposition:   I will see her again in 6 months.   Signed, Mertie Moores, MD  04/24/2018 2:57 PM    [facility] [address], [place], [place]  [address] Phone: [phone]; Fax: [phone]

## 2018-04-24 NOTE — Patient Instructions (Signed)

## 2018-04-25 DIAGNOSIS — N182 Chronic kidney disease, stage 2 (mild): Secondary | ICD-10-CM | POA: Diagnosis not present

## 2018-04-25 DIAGNOSIS — D631 Anemia in chronic kidney disease: Secondary | ICD-10-CM | POA: Diagnosis not present

## 2018-04-25 DIAGNOSIS — L97522 Non-pressure chronic ulcer of other part of left foot with fat layer exposed: Secondary | ICD-10-CM | POA: Diagnosis not present

## 2018-04-25 DIAGNOSIS — I5030 Unspecified diastolic (congestive) heart failure: Secondary | ICD-10-CM | POA: Diagnosis not present

## 2018-04-25 DIAGNOSIS — I130 Hypertensive heart and chronic kidney disease with heart failure and stage 1 through stage 4 chronic kidney disease, or unspecified chronic kidney disease: Secondary | ICD-10-CM | POA: Diagnosis not present

## 2018-04-25 DIAGNOSIS — I2510 Atherosclerotic heart disease of native coronary artery without angina pectoris: Secondary | ICD-10-CM | POA: Diagnosis not present

## 2018-04-26 DIAGNOSIS — S60221A Contusion of right hand, initial encounter: Secondary | ICD-10-CM | POA: Diagnosis not present

## 2018-04-26 DIAGNOSIS — S61411A Laceration without foreign body of right hand, initial encounter: Secondary | ICD-10-CM | POA: Diagnosis not present

## 2018-04-26 DIAGNOSIS — I482 Chronic atrial fibrillation, unspecified: Secondary | ICD-10-CM | POA: Diagnosis not present

## 2018-04-26 DIAGNOSIS — Z7901 Long term (current) use of anticoagulants: Secondary | ICD-10-CM | POA: Diagnosis not present

## 2018-04-26 DIAGNOSIS — K7460 Unspecified cirrhosis of liver: Secondary | ICD-10-CM | POA: Diagnosis not present

## 2018-04-26 DIAGNOSIS — T8189XA Other complications of procedures, not elsewhere classified, initial encounter: Secondary | ICD-10-CM | POA: Diagnosis not present

## 2018-04-26 DIAGNOSIS — F0390 Unspecified dementia without behavioral disturbance: Secondary | ICD-10-CM | POA: Diagnosis not present

## 2018-04-27 DIAGNOSIS — I2510 Atherosclerotic heart disease of native coronary artery without angina pectoris: Secondary | ICD-10-CM | POA: Diagnosis not present

## 2018-04-27 DIAGNOSIS — I130 Hypertensive heart and chronic kidney disease with heart failure and stage 1 through stage 4 chronic kidney disease, or unspecified chronic kidney disease: Secondary | ICD-10-CM | POA: Diagnosis not present

## 2018-04-27 DIAGNOSIS — N182 Chronic kidney disease, stage 2 (mild): Secondary | ICD-10-CM | POA: Diagnosis not present

## 2018-04-27 DIAGNOSIS — D631 Anemia in chronic kidney disease: Secondary | ICD-10-CM | POA: Diagnosis not present

## 2018-04-27 DIAGNOSIS — I5030 Unspecified diastolic (congestive) heart failure: Secondary | ICD-10-CM | POA: Diagnosis not present

## 2018-04-27 DIAGNOSIS — L97522 Non-pressure chronic ulcer of other part of left foot with fat layer exposed: Secondary | ICD-10-CM | POA: Diagnosis not present

## 2018-05-02 DIAGNOSIS — I130 Hypertensive heart and chronic kidney disease with heart failure and stage 1 through stage 4 chronic kidney disease, or unspecified chronic kidney disease: Secondary | ICD-10-CM | POA: Diagnosis not present

## 2018-05-02 DIAGNOSIS — L97522 Non-pressure chronic ulcer of other part of left foot with fat layer exposed: Secondary | ICD-10-CM | POA: Diagnosis not present

## 2018-05-02 DIAGNOSIS — D631 Anemia in chronic kidney disease: Secondary | ICD-10-CM | POA: Diagnosis not present

## 2018-05-02 DIAGNOSIS — I5030 Unspecified diastolic (congestive) heart failure: Secondary | ICD-10-CM | POA: Diagnosis not present

## 2018-05-02 DIAGNOSIS — N182 Chronic kidney disease, stage 2 (mild): Secondary | ICD-10-CM | POA: Diagnosis not present

## 2018-05-02 DIAGNOSIS — I2510 Atherosclerotic heart disease of native coronary artery without angina pectoris: Secondary | ICD-10-CM | POA: Diagnosis not present

## 2018-05-03 DIAGNOSIS — L97522 Non-pressure chronic ulcer of other part of left foot with fat layer exposed: Secondary | ICD-10-CM | POA: Diagnosis not present

## 2018-05-03 DIAGNOSIS — I130 Hypertensive heart and chronic kidney disease with heart failure and stage 1 through stage 4 chronic kidney disease, or unspecified chronic kidney disease: Secondary | ICD-10-CM | POA: Diagnosis not present

## 2018-05-03 DIAGNOSIS — D631 Anemia in chronic kidney disease: Secondary | ICD-10-CM | POA: Diagnosis not present

## 2018-05-03 DIAGNOSIS — I5030 Unspecified diastolic (congestive) heart failure: Secondary | ICD-10-CM | POA: Diagnosis not present

## 2018-05-03 DIAGNOSIS — I2510 Atherosclerotic heart disease of native coronary artery without angina pectoris: Secondary | ICD-10-CM | POA: Diagnosis not present

## 2018-05-03 DIAGNOSIS — N182 Chronic kidney disease, stage 2 (mild): Secondary | ICD-10-CM | POA: Diagnosis not present

## 2018-05-04 ENCOUNTER — Emergency Department (HOSPITAL_COMMUNITY): Payer: Medicare Other

## 2018-05-04 ENCOUNTER — Inpatient Hospital Stay (HOSPITAL_COMMUNITY)
Admission: EM | Admit: 2018-05-04 | Discharge: 2018-05-10 | DRG: 689 | Disposition: A | Discharge status: Home / Self Care | Payer: Medicare Other | Source: Skilled Nursing Facility | Attending: Internal Medicine | Admitting: Internal Medicine

## 2018-05-04 ENCOUNTER — Other Ambulatory Visit: Payer: Self-pay

## 2018-05-04 ENCOUNTER — Encounter (HOSPITAL_COMMUNITY): Payer: Self-pay | Admitting: Emergency Medicine

## 2018-05-04 DIAGNOSIS — K7460 Unspecified cirrhosis of liver: Secondary | ICD-10-CM | POA: Diagnosis present

## 2018-05-04 DIAGNOSIS — Z880 Allergy status to penicillin: Secondary | ICD-10-CM

## 2018-05-04 DIAGNOSIS — G8929 Other chronic pain: Secondary | ICD-10-CM | POA: Diagnosis present

## 2018-05-04 DIAGNOSIS — Z952 Presence of prosthetic heart valve: Secondary | ICD-10-CM

## 2018-05-04 DIAGNOSIS — I130 Hypertensive heart and chronic kidney disease with heart failure and stage 1 through stage 4 chronic kidney disease, or unspecified chronic kidney disease: Secondary | ICD-10-CM | POA: Diagnosis present

## 2018-05-04 DIAGNOSIS — K5900 Constipation, unspecified: Secondary | ICD-10-CM | POA: Diagnosis not present

## 2018-05-04 DIAGNOSIS — I272 Pulmonary hypertension, unspecified: Secondary | ICD-10-CM | POA: Diagnosis present

## 2018-05-04 DIAGNOSIS — R410 Disorientation, unspecified: Secondary | ICD-10-CM

## 2018-05-04 DIAGNOSIS — Z800 Family history of malignant neoplasm of digestive organs: Secondary | ICD-10-CM

## 2018-05-04 DIAGNOSIS — R059 Cough, unspecified: Secondary | ICD-10-CM

## 2018-05-04 DIAGNOSIS — Z8711 Personal history of peptic ulcer disease: Secondary | ICD-10-CM

## 2018-05-04 DIAGNOSIS — D631 Anemia in chronic kidney disease: Secondary | ICD-10-CM | POA: Diagnosis not present

## 2018-05-04 DIAGNOSIS — Z974 Presence of external hearing-aid: Secondary | ICD-10-CM

## 2018-05-04 DIAGNOSIS — N183 Chronic kidney disease, stage 3 (moderate): Secondary | ICD-10-CM | POA: Diagnosis present

## 2018-05-04 DIAGNOSIS — Z8673 Personal history of transient ischemic attack (TIA), and cerebral infarction without residual deficits: Secondary | ICD-10-CM

## 2018-05-04 DIAGNOSIS — Z9089 Acquired absence of other organs: Secondary | ICD-10-CM

## 2018-05-04 DIAGNOSIS — I482 Chronic atrial fibrillation, unspecified: Secondary | ICD-10-CM | POA: Diagnosis not present

## 2018-05-04 DIAGNOSIS — Z87891 Personal history of nicotine dependence: Secondary | ICD-10-CM

## 2018-05-04 DIAGNOSIS — Z885 Allergy status to narcotic agent status: Secondary | ICD-10-CM

## 2018-05-04 DIAGNOSIS — E860 Dehydration: Secondary | ICD-10-CM | POA: Diagnosis not present

## 2018-05-04 DIAGNOSIS — Z66 Do not resuscitate: Secondary | ICD-10-CM | POA: Diagnosis present

## 2018-05-04 DIAGNOSIS — N3001 Acute cystitis with hematuria: Secondary | ICD-10-CM | POA: Diagnosis not present

## 2018-05-04 DIAGNOSIS — X58XXXD Exposure to other specified factors, subsequent encounter: Secondary | ICD-10-CM | POA: Diagnosis present

## 2018-05-04 DIAGNOSIS — Z79899 Other long term (current) drug therapy: Secondary | ICD-10-CM

## 2018-05-04 DIAGNOSIS — G9341 Metabolic encephalopathy: Secondary | ICD-10-CM | POA: Diagnosis not present

## 2018-05-04 DIAGNOSIS — E785 Hyperlipidemia, unspecified: Secondary | ICD-10-CM | POA: Diagnosis present

## 2018-05-04 DIAGNOSIS — H9193 Unspecified hearing loss, bilateral: Secondary | ICD-10-CM | POA: Diagnosis present

## 2018-05-04 DIAGNOSIS — L97521 Non-pressure chronic ulcer of other part of left foot limited to breakdown of skin: Secondary | ICD-10-CM | POA: Diagnosis present

## 2018-05-04 DIAGNOSIS — I5030 Unspecified diastolic (congestive) heart failure: Secondary | ICD-10-CM | POA: Diagnosis not present

## 2018-05-04 DIAGNOSIS — F0390 Unspecified dementia without behavioral disturbance: Secondary | ICD-10-CM | POA: Diagnosis present

## 2018-05-04 DIAGNOSIS — I5032 Chronic diastolic (congestive) heart failure: Secondary | ICD-10-CM | POA: Diagnosis present

## 2018-05-04 DIAGNOSIS — K219 Gastro-esophageal reflux disease without esophagitis: Secondary | ICD-10-CM | POA: Diagnosis present

## 2018-05-04 DIAGNOSIS — K2950 Unspecified chronic gastritis without bleeding: Secondary | ICD-10-CM | POA: Diagnosis present

## 2018-05-04 DIAGNOSIS — I10 Essential (primary) hypertension: Secondary | ICD-10-CM | POA: Diagnosis present

## 2018-05-04 DIAGNOSIS — M549 Dorsalgia, unspecified: Secondary | ICD-10-CM | POA: Diagnosis present

## 2018-05-04 DIAGNOSIS — E871 Hypo-osmolality and hyponatremia: Secondary | ICD-10-CM | POA: Diagnosis present

## 2018-05-04 DIAGNOSIS — Z9842 Cataract extraction status, left eye: Secondary | ICD-10-CM

## 2018-05-04 DIAGNOSIS — R05 Cough: Secondary | ICD-10-CM

## 2018-05-04 DIAGNOSIS — N390 Urinary tract infection, site not specified: Secondary | ICD-10-CM

## 2018-05-04 DIAGNOSIS — Z79891 Long term (current) use of opiate analgesic: Secondary | ICD-10-CM

## 2018-05-04 DIAGNOSIS — B952 Enterococcus as the cause of diseases classified elsewhere: Secondary | ICD-10-CM | POA: Diagnosis present

## 2018-05-04 DIAGNOSIS — Z811 Family history of alcohol abuse and dependence: Secondary | ICD-10-CM

## 2018-05-04 DIAGNOSIS — Z9841 Cataract extraction status, right eye: Secondary | ICD-10-CM

## 2018-05-04 DIAGNOSIS — Z881 Allergy status to other antibiotic agents status: Secondary | ICD-10-CM

## 2018-05-04 DIAGNOSIS — I4891 Unspecified atrial fibrillation: Secondary | ICD-10-CM | POA: Diagnosis not present

## 2018-05-04 DIAGNOSIS — L97522 Non-pressure chronic ulcer of other part of left foot with fat layer exposed: Secondary | ICD-10-CM | POA: Diagnosis not present

## 2018-05-04 DIAGNOSIS — Z888 Allergy status to other drugs, medicaments and biological substances status: Secondary | ICD-10-CM

## 2018-05-04 DIAGNOSIS — Z8249 Family history of ischemic heart disease and other diseases of the circulatory system: Secondary | ICD-10-CM

## 2018-05-04 DIAGNOSIS — G9389 Other specified disorders of brain: Secondary | ICD-10-CM | POA: Diagnosis not present

## 2018-05-04 DIAGNOSIS — N3010 Interstitial cystitis (chronic) without hematuria: Secondary | ICD-10-CM | POA: Diagnosis present

## 2018-05-04 DIAGNOSIS — Z8679 Personal history of other diseases of the circulatory system: Secondary | ICD-10-CM

## 2018-05-04 DIAGNOSIS — R4182 Altered mental status, unspecified: Secondary | ICD-10-CM | POA: Diagnosis not present

## 2018-05-04 DIAGNOSIS — I2510 Atherosclerotic heart disease of native coronary artery without angina pectoris: Secondary | ICD-10-CM | POA: Diagnosis not present

## 2018-05-04 DIAGNOSIS — N182 Chronic kidney disease, stage 2 (mild): Secondary | ICD-10-CM | POA: Diagnosis not present

## 2018-05-04 DIAGNOSIS — M21379 Foot drop, unspecified foot: Secondary | ICD-10-CM | POA: Diagnosis present

## 2018-05-04 DIAGNOSIS — Z7901 Long term (current) use of anticoagulants: Secondary | ICD-10-CM

## 2018-05-04 DIAGNOSIS — Z90710 Acquired absence of both cervix and uterus: Secondary | ICD-10-CM

## 2018-05-04 DIAGNOSIS — Z818 Family history of other mental and behavioral disorders: Secondary | ICD-10-CM

## 2018-05-04 DIAGNOSIS — R3915 Urgency of urination: Secondary | ICD-10-CM | POA: Diagnosis present

## 2018-05-04 DIAGNOSIS — H3530 Unspecified macular degeneration: Secondary | ICD-10-CM | POA: Diagnosis present

## 2018-05-04 DIAGNOSIS — Z9049 Acquired absence of other specified parts of digestive tract: Secondary | ICD-10-CM

## 2018-05-04 DIAGNOSIS — Z961 Presence of intraocular lens: Secondary | ICD-10-CM | POA: Diagnosis present

## 2018-05-04 DIAGNOSIS — E875 Hyperkalemia: Secondary | ICD-10-CM | POA: Diagnosis not present

## 2018-05-04 DIAGNOSIS — S61412D Laceration without foreign body of left hand, subsequent encounter: Secondary | ICD-10-CM

## 2018-05-04 DIAGNOSIS — I350 Nonrheumatic aortic (valve) stenosis: Secondary | ICD-10-CM

## 2018-05-04 DIAGNOSIS — L97529 Non-pressure chronic ulcer of other part of left foot with unspecified severity: Secondary | ICD-10-CM

## 2018-05-04 LAB — COMPREHENSIVE METABOLIC PANEL
ALT: 18 U/L (ref 0–44)
AST: 25 U/L (ref 15–41)
Albumin: 4.2 g/dL (ref 3.5–5.0)
Alkaline Phosphatase: 70 U/L (ref 38–126)
Anion gap: 9 (ref 5–15)
BUN: 20 mg/dL (ref 8–23)
CO2: 24 mmol/L (ref 22–32)
Calcium: 8.7 mg/dL — ABNORMAL LOW (ref 8.9–10.3)
Chloride: 101 mmol/L (ref 98–111)
Creatinine, Ser: 0.68 mg/dL (ref 0.44–1.00)
GFR calc Af Amer: >60 mL/min (ref >60)
GFR calc non Af Amer: >60 mL/min (ref >60)
Glucose, Bld: 129 mg/dL — ABNORMAL HIGH (ref 70–99)
Potassium: 3.8 mmol/L (ref 3.5–5.1)
Sodium: 134 mmol/L — ABNORMAL LOW (ref 135–145)
TOTAL PROTEIN: 8 g/dL (ref 6.5–8.1)
Total Bilirubin: 0.8 mg/dL (ref 0.3–1.2)

## 2018-05-04 LAB — URINALYSIS, ROUTINE W REFLEX MICROSCOPIC
Bilirubin Urine: NEGATIVE
Glucose, UA: NEGATIVE mg/dL
Ketones, ur: NEGATIVE mg/dL
Nitrite: NEGATIVE
Protein, ur: 30 mg/dL — AB
SPECIFIC GRAVITY, URINE: 1.005 (ref 1.005–1.030)
pH: 6 (ref 5.0–8.0)

## 2018-05-04 LAB — CBC
HCT: 39.9 % (ref 36.0–46.0)
HEMOGLOBIN: 13 g/dL (ref 12.0–15.0)
MCH: 30.2 pg (ref 26.0–34.0)
MCHC: 32.6 g/dL (ref 30.0–36.0)
MCV: 92.6 fL (ref 80.0–100.0)
Platelets: 213 10*3/uL (ref 150–400)
RBC: 4.31 MIL/uL (ref 3.87–5.11)
RDW: 14.4 % (ref 11.5–15.5)
WBC: 9 10*3/uL (ref 4.0–10.5)
nRBC: 0 % (ref 0.0–0.2)

## 2018-05-04 LAB — CBG MONITORING, ED: GLUCOSE-CAPILLARY: 130 mg/dL — AB (ref 70–99)

## 2018-05-04 LAB — PROTIME-INR
INR: 2.27
Prothrombin Time: 24.7 seconds — ABNORMAL HIGH (ref 11.4–15.2)

## 2018-05-04 MED ORDER — SODIUM CHLORIDE 0.9% FLUSH
3.0000 mL | Freq: Once | INTRAVENOUS | Status: AC
  Administered 2018-05-04: 3 mL via INTRAVENOUS

## 2018-05-04 MED ORDER — SODIUM CHLORIDE 0.9 % IV SOLN
1.0000 g | Freq: Once | INTRAVENOUS | Status: AC
  Administered 2018-05-04: 1 g via INTRAVENOUS
  Filled 2018-05-04: qty 10

## 2018-05-04 NOTE — Progress Notes (Signed)
ANTICOAGULATION CONSULT NOTE - Initial Consult  Pharmacy Consult for Warfarin Indication: atrial fibrillation  Allergies  Allergen Reactions  . Amlodipine Swelling and Other (See Comments)     Unspecified swelling reaction  . Amoxicillin Diarrhea, Nausea And Vomiting and Other (See Comments)    Has patient had a PCN reaction causing immediate rash, facial/tongue/throat swelling, SOB or lightheadedness with hypotension: No Has patient had a PCN reaction causing severe rash involving mucus membranes or skin necrosis: No Has patient had a PCN reaction that required hospitalization: No Has patient had a PCN reaction occurring within the last 10 years: No If all of the above answers are "NO", then may proceed with Cephalosporin use.  . Carafate [Sucralfate] Swelling and Other (See Comments)    Reaction:  Knee swelling/redness   . Cephalexin Diarrhea  . Codeine Nausea And Vomiting  . Irbesartan Swelling and Other (See Comments)    Reaction:  Facial/hand swelling and numbness   . Morphine And Related Other (See Comments)    Pt states that she has a history of addiction with this medication.      . Neurontin [Gabapentin] Swelling and Other (See Comments)    Reaction:  Leg swelling   . Nitrofurantoin Monohyd Macro Diarrhea and Nausea And Vomiting  . Prednisone Other (See Comments)    Reaction:  Elevated BP  . Sulfa Antibiotics Rash    Patient Measurements: Height: 5\' 1"  (154.9 cm) Weight: 125 lb (56.7 kg) IBW/kg (Calculated) : 47.8   Vital Signs: Temp: 98.4 F (36.9 C) (01/24 1943) Temp Source: Oral (01/24 1943) BP: 148/71 (01/24 2300) Pulse Rate: 65 (01/24 2300)  Labs: Recent Labs    05/04/18 1953  HGB 13.0  HCT 39.9  PLT 213  LABPROT 24.7*  INR 2.27  CREATININE 0.68    Estimated Creatinine Clearance: 36.7 mL/min (by C-G formula based on SCr of 0.68 mg/dL).   Medical History: Past Medical History:  Diagnosis Date  . Abnormal liver diagnostic imaging    suspected  cirrhosis  . Arthritis   . Atrial fibrillation ([place])   . Chronic back pain   . Diverticulosis   . Dyslipidemia    takes Lipitor daily  . Dysrhythmia    Atrial Fibrillation  . Foot drop    SINCE 1987  . GERD (gastroesophageal reflux disease)   . H/O hiatal hernia   . Hearing impaired    Bilateral hearing aids  . History of bacterial endocarditis   . History of gastritis   . History of stomach ulcers    many yrs ago  . History of TIA (transient ischemic attack)    2013-- RESIDUAL PERIPHERAL VISION RIGHT EYE--  RESOLVED  . Hypertension   . IC (interstitial cystitis)   . Macular degeneration   . Pulmonary hypertension ([place]) 10/2013   Based on TTE  . S/P aortic valve replacement    2005  . Urine incontinence     Medications:  Scheduled:   Infusions:  . sodium chloride      Assessment: 88 yoF admitted with altered mental status, HA and HTN on home warfarin for a-fib.  INR=2.27 on admission. HD 2.5 mg Tu/Sat and 5 mg other days. LD 1/24  Goal of Therapy:  INR 2-3    Plan:  Daily PT/INR  Dorrene German 05/04/2018,11:42 PM

## 2018-05-04 NOTE — H&P (Addendum)
History and Physical    Brittany [name] OVF:[id number] DOB: [birth year] DOA: 05/04/2018  PCP: Mayra Neer, MD  Patient [facility]: [facility]   Chief Complaint: confusion  HPI: Brittany [name] is a 85 y.o. female with medical history significant for AS s/p aortic valve replacement, mild pHTN, HTN, CVA, a-fib on coumadin, chronic hyponatremia, and possible cirrhosis, who presents with above.  Family reports several years of episodes of intermittent confusion. For past week or so has been sometimes confused, mainly forgetting dates, also forgetting time of day. No decreased level of consciousness or changes in mobility. NO chest pain or SOB. No abdominal pain. No dysuria or fevers or hematuria or new incontinence. Cites years of intermittent urinary urgency, unchanged from baseline. No cough. Followed by wound clinic for small ulcer on lateral left foot that has been very slow to heal; is told has been healing, and recent laceration to dosrum of left hand that is healing. Had headache earlier today and bp was elevated (unsure to what) earlier today. Headache has almost entirely resolved. Recently moved to new location, assisted living facility. Takes hydrocodone and valium daily, or most days.  ED Course: ct head, labs, ceftriaxone  Review of Systems: As per HPI otherwise 10 point review of systems negative.    Past Medical History:  Diagnosis Date  . Abnormal liver diagnostic imaging    suspected cirrhosis  . Arthritis   . Atrial fibrillation ([place])   . Chronic back pain   . Diverticulosis   . Dyslipidemia    takes Lipitor daily  . Dysrhythmia    Atrial Fibrillation  . Foot drop    SINCE 1987  . GERD (gastroesophageal reflux disease)   . H/O hiatal hernia   . Hearing impaired    Bilateral hearing aids  . History of bacterial endocarditis   . History of gastritis   . History of stomach ulcers    many yrs ago  . History of TIA (transient ischemic attack)    2013--  RESIDUAL PERIPHERAL VISION RIGHT EYE--  RESOLVED  . Hypertension   . IC (interstitial cystitis)   . Macular degeneration   . Pulmonary hypertension ([place]) 10/2013   Based on TTE  . S/P aortic valve replacement    2005  . Urine incontinence     Past Surgical History:  Procedure Laterality Date  . ABDOMINAL HYSTERECTOMY  1967  . AORTIC VALVE REPLACEMENT  09-29-2003    [facility] pericardial tissue valve  . APPENDECTOMY  1933  . BIOPSY  02/01/2018   Procedure: BIOPSY;  Surgeon: Rush Landmark Telford Nab., MD;  Location: [facility];  Service: Gastroenterology;;  . CARDIAC CATHETERIZATION  07-15-2003 DR HELEN PRESTON   MODERATE TO MODERATELY SEVERE CALCIFIC AORTIC STENOSIS/  NORMAL CORONARY ARTERIES  . CARDIOVASCULAR STRESS TEST  01-30-2012  DR NASHER   NORMAL NUCLEAR STUDY/  EF 73%/  NORMAL LVF  . CARPAL TUNNEL RELEASE Bilateral   . CATARACT EXTRACTION W/ INTRAOCULAR LENS  IMPLANT, BILATERAL    . CHOLECYSTECTOMY  1993  . CYSTO WITH HYDRODISTENSION N/A 02/05/2013   Procedure: CYSTOSCOPY/HYDRODISTENSION;  Surgeon: Irine Seal, MD;  [facility];  Service: Urology;  Laterality: N/A;  . CYSTO/ HYDRODISTENTION/ INSTILLATION CLORPACTIN  MULTIPLE  last one 2009  . DILATION AND CURETTAGE OF UTERUS    . ERCP N/A 08/10/2012   Procedure: ENDOSCOPIC RETROGRADE CHOLANGIOPANCREATOGRAPHY (ERCP);  Surgeon: Inda Castle, MD;  Location: [facility];  Service: Gastroenterology;  Laterality: N/A;  .  ESOPHAGOGASTRODUODENOSCOPY (EGD) WITH PROPOFOL N/A 08/18/2014   Procedure: ESOPHAGOGASTRODUODENOSCOPY (EGD) WITH PROPOFOL;  Surgeon: Inda Castle, MD;  Location: [facility];  Service: Endoscopy;  Laterality: N/A;  . ESOPHAGOGASTRODUODENOSCOPY (EGD) WITH PROPOFOL N/A 02/01/2018   Procedure: ESOPHAGOGASTRODUODENOSCOPY (EGD) WITH PROPOFOL;  Surgeon: Rush Landmark Telford Nab., MD;  Location: [facility];  Service: Gastroenterology;  Laterality: N/A;  . FOREIGN BODY REMOVAL  02/01/2018     Procedure: FOREIGN BODY REMOVAL;  Surgeon: Rush Landmark Telford Nab., MD;  Location: [place];  Service: Gastroenterology;;  . LEFT HEART CATHETERIZATION WITH CORONARY ANGIOGRAM N/A 10/18/2013   Procedure: LEFT HEART CATHETERIZATION WITH CORONARY ANGIOGRAM;  Surgeon: Jettie Booze, MD;  Location: [facility] CATH LAB;  Service: Cardiovascular;  Laterality: N/A;  . LUMBAR [place] &  2007  . MACROPLASTIQUE URETHRAL IMPLANTATION  08-27-2009  . MINI-OPEN RIGHT ROTATOR CUFF REPAIR  07-12-2001  . RIGHT SHOULDER ARTHROSCOPY W/ DEBRIDEMENT ROTATOR CUFF AND LABRAL TEAR/ ACROMINOPLASTY/ DISTAL CLAVICLE EXCISION/ CA LIGAMENT RELEASE  10-08-1999  . TONSILLECTOMY AND ADENOIDECTOMY    . TRANSTHORACIC ECHOCARDIOGRAM  08-10-2011   MILD LVH/  EF 55-60%/  NORMAL AVR TISSUE/ MILD MR/  MODERATE DILATED LA/  MILD DILATED RA  . TRANSVAGINAL TAPE PROCEDURE  07-30-2002  . VEIN LIGATION  1969   RIGHT LOWER LEG     reports that she quit smoking about 28 years ago. Her smoking use included cigarettes. She started smoking about 69 years ago. She has a 20.00 pack-year smoking history. She has never used smokeless tobacco. She reports that she does not drink alcohol or use drugs.  Allergies  Allergen Reactions  . Amlodipine Swelling and Other (See Comments)     Unspecified swelling reaction  . Amoxicillin Diarrhea, Nausea And Vomiting and Other (See Comments)    Has patient had a PCN reaction causing immediate rash, facial/tongue/throat swelling, SOB or lightheadedness with hypotension: No Has patient had a PCN reaction causing severe rash involving mucus membranes or skin necrosis: No Has patient had a PCN reaction that required hospitalization: No Has patient had a PCN reaction occurring within the last 10 years: No If all of the above answers are "NO", then may proceed with Cephalosporin use.  . Carafate [Sucralfate] Swelling and Other (See Comments)    Reaction:  Knee swelling/redness   . Cephalexin  Diarrhea  . Codeine Nausea And Vomiting  . Irbesartan Swelling and Other (See Comments)    Reaction:  Facial/hand swelling and numbness   . Morphine And Related Other (See Comments)    Pt states that she has a history of addiction with this medication.      . Neurontin [Gabapentin] Swelling and Other (See Comments)    Reaction:  Leg swelling   . Nitrofurantoin Monohyd Macro Diarrhea and Nausea And Vomiting  . Prednisone Other (See Comments)    Reaction:  Elevated BP  . Sulfa Antibiotics Rash    Family History  Problem Relation Age of Onset  . Heart disease Father   . Stomach cancer Maternal Grandmother   . Esophageal cancer Maternal Grandfather   . Bipolar disorder Son        Committed Suicide  . Coronary artery disease Brother   . Bipolar disorder Brother        commited suiside at 67yo  . Alcohol abuse Sister        sister #1  . Alcohol abuse Sister        sister #2  . Colon cancer Maternal Aunt   . Lung  disease Neg Hx     Prior to Admission medications   Medication Sig Start Date End Date Taking? Authorizing Provider  atorvastatin (LIPITOR) 20 MG tablet Take 20 mg by mouth daily.   Yes [provider]  augmented betamethasone dipropionate (DIPROLENE-AF) 0.05 % cream Apply 1 application topically daily.  04/18/18  Yes [provider]  busPIRone (BUSPAR) 15 MG tablet Take 1 tablet (15 mg total) by mouth 2 (two) times daily. 03/16/18  Yes Armbruster, Carlota Raspberry, MD  cycloSPORINE (RESTASIS) 0.05 % ophthalmic emulsion Place 1 drop into both eyes 2 (two) times daily.   Yes [provider]  diazepam (VALIUM) 5 MG tablet Take 5 mg by mouth at bedtime.    Yes [provider]  diltiazem (CARDIZEM) 120 MG tablet Take 120 mg by mouth 2 (two) times daily.   Yes [provider]  famotidine (PEPCID) 40 MG tablet Take 40 mg by mouth daily.   Yes [provider]  HYDROcodone-acetaminophen (NORCO/VICODIN) 5-325 MG tablet Take 1 tablet by  mouth every 6 (six) hours as needed for severe pain. 02/02/18  Yes Aline August, MD  lisinopril (PRINIVIL,ZESTRIL) 40 MG tablet Take 40 mg by mouth daily.    Yes [provider]  Multiple Vitamins-Minerals (ICAPS AREDS 2) CAPS Take 1 capsule by mouth 2 (two) times daily.   Yes [provider]  ondansetron (ZOFRAN) 8 MG tablet Take 1 tablet (8 mg total) by mouth 2 (two) times daily. Patient taking differently: Take 8 mg by mouth every 8 (eight) hours as needed for nausea or vomiting.  02/22/18  Yes Armbruster, Carlota Raspberry, MD  pantoprazole (PROTONIX) 20 MG tablet Take 1 tablet (20 mg total) by mouth daily. 04/19/18  Yes Shamleffer, Melanie Crazier, MD  Polyvinyl Alcohol-Povidone (REFRESH OP) Place 1 drop into both eyes 2 (two) times daily.   Yes [provider]  warfarin (COUMADIN) 5 MG tablet Take as directed by [facility] Patient taking differently: Take 2.5-5 mg by mouth. Take 2.5 mg every Tues and Sat & Take 5 mg all other days ake as directed by Coumadin Clinic 04/12/18  Yes Nahser, Wonda Cheng, MD  furosemide (LASIX) 40 MG tablet Take 40 mg by mouth daily as needed for fluid.  03/15/18   [provider]  nitroGLYCERIN (NITROSTAT) 0.4 MG SL tablet Place 1 tablet (0.4 mg total) under the tongue every 5 (five) minutes as needed for chest pain. 02/02/18   Aline August, MD  polyethylene glycol (MIRALAX / GLYCOLAX) packet Take 17 g by mouth daily as needed for moderate constipation. 02/02/18   Aline August, MD  potassium chloride SA (K-DUR,KLOR-CON) 20 MEQ tablet Take 20 mEq by mouth daily as needed (ONLY WHEN TAKING TORSEMIDE).    [provider]  torsemide (DEMADEX) 20 MG tablet Take 20 mg by mouth daily as needed (retaining fluid).     [provider]    Physical Exam: Vitals:   05/04/18 2104 05/04/18 2130 05/04/18 2200 05/04/18 2230  BP: (!) 144/80 (!) 142/77 136/76 132/72  Pulse: 75 71 71 65  Resp: 19 14 13 17   Temp:      TempSrc:        SpO2: 97% 99% 97% 95%  Weight:      Height:        Constitutional: No acute distress Head: Atraumatic Eyes: Conjunctiva clear ENM: Moist mucous membranes. Normal dentition.  Neck: Supple Respiratory: Clear to auscultation bilaterally, no wheezing/rales/rhonchi. Normal respiratory effort. No accessory muscle use. Marland Kitchen  Cardiovascular: Regular rate and rhythm. No murmurs/rubs/gallops. Abdomen: Non-tender, non-distended. No masses. No rebound or guarding. Positive bowel sounds. Musculoskeletal: No joint deformity upper and lower extremities. Normal ROM, no contractures. Normal muscle tone.  Skin: No rashes. Shallow ulcer lateral left foot, superficial abrasion dorsum of left hand  Extremities: No peripheral edema. Palpable peripheral pulses. Neurologic: Alert, moving all 4 extremities. Psychiatric: Normal insight and judgement.   Labs on Admission: I have personally reviewed following labs and imaging studies  CBC: Recent Labs  Lab 05/04/18 1953  WBC 9.0  HGB 13.0  HCT 39.9  MCV 92.6  PLT 440   Basic Metabolic Panel: Recent Labs  Lab 05/04/18 1953  NA 134*  K 3.8  CL 101  CO2 24  GLUCOSE 129*  BUN 20  CREATININE 0.68  CALCIUM 8.7*   GFR: Estimated Creatinine Clearance: 36.7 mL/min (by C-G formula based on SCr of 0.68 mg/dL). Liver Function Tests: Recent Labs  Lab 05/04/18 1953  AST 25  ALT 18  ALKPHOS 70  BILITOT 0.8  PROT 8.0  ALBUMIN 4.2   No results for input(s): LIPASE, AMYLASE in the last 168 hours. No results for input(s): AMMONIA in the last 168 hours. Coagulation Profile: Recent Labs  Lab 05/04/18 1953  INR 2.27   Cardiac Enzymes: No results for input(s): CKTOTAL, CKMB, CKMBINDEX, TROPONINI in the last 168 hours. BNP (last 3 results) No results for input(s): PROBNP in the last 8760 hours. HbA1C: No results for input(s): HGBA1C in the last 72 hours. CBG: Recent Labs  Lab 05/04/18 1949  GLUCAP 130*   Lipid Profile: No results for  input(s): CHOL, HDL, LDLCALC, TRIG, CHOLHDL, LDLDIRECT in the last 72 hours. Thyroid Function Tests: No results for input(s): TSH, T4TOTAL, FREET4, T3FREE, THYROIDAB in the last 72 hours. Anemia Panel: No results for input(s): VITAMINB12, FOLATE, FERRITIN, TIBC, IRON, RETICCTPCT in the last 72 hours. Urine analysis:    Component Value Date/Time   COLORURINE STRAW (A) 05/04/2018 2104   APPEARANCEUR CLEAR 05/04/2018 2104   LABSPEC 1.005 05/04/2018 2104   PHURINE 6.0 05/04/2018 2104   GLUCOSEU NEGATIVE 05/04/2018 2104   GLUCOSEU NEGATIVE 06/25/2014 1201   HGBUR MODERATE (A) 05/04/2018 2104   BILIRUBINUR NEGATIVE 05/04/2018 2104   [place] NEGATIVE 05/04/2018 2104   PROTEINUR 30 (A) 05/04/2018 2104   UROBILINOGEN 0.2 08/27/2014 1954   NITRITE NEGATIVE 05/04/2018 2104   LEUKOCYTESUR MODERATE (A) 05/04/2018 2104    Radiological Exams on Admission: Ct Head Wo Contrast  Result Date: 05/04/2018 CLINICAL DATA:  Confusion and headache EXAM: CT HEAD WITHOUT CONTRAST TECHNIQUE: Contiguous axial images were obtained from the base of the skull through the vertex without intravenous contrast. COMPARISON:  04/03/2017 FINDINGS: Brain: Encephalomalacia secondary to remote left occipital lobe infarct is redemonstrated with extensive moderate to marked small vessel ischemic disease of white matter. Superficial and central atrophy is stable. No acute intracranial hemorrhage, midline shift or edema. No new large vascular territory infarct. Midline fourth ventricle and basal cisterns without effacement. The brainstem and cerebellum are stable in appearance without acute abnormalities. No intra-axial mass nor extra-axial fluid. Vascular: No hyperdense vessel sign. Atherosclerosis of the carotid siphons bilaterally. Skull: Intact Sinuses/Orbits: Bilateral lens replacements. Mild ethmoid sinus mucosal thickening. Mastoids are clear. Other: None IMPRESSION: 1. Stable encephalomalacia secondary to remote left  occipital lobe infarct with extensive small vessel ischemic disease of white matter. 2. No acute intracranial abnormality. Electronically Signed   By: Ashley Royalty M.D.   On: 05/04/2018 20:28  EKG: Independently reviewed. A-fib, no ischemic changes. LAD  Assessment/Plan Active Problems:   Aortic stenosis   Hypertension   Chronic atrial fibrillation (HCC)   Hyponatremia   H/O aortic valve replacement   Confusion   Dementia (HCC)   Chronic ulcer of left foot ([place])   # Confusion # Dementia - Intermittent episodes of confusion for several years. CT no acute findings but extensive small vessel ischemic disease and duration of symptoms suggests underlying vascular dementia. Daily use of opioid and benzodiazepine may very well contribute. BP only mildly elevated. UA suggestive of bacteriuria, but no classic symptoms of uti, no physiologic s/s of acute illness or infection. Stable mild hyponatremia, labs otherwise unremarkable. Has had one dose of ceftriaxone in ED for possible UTI. Called to admit for UTI and family concern. Family is very desirous of admission, aware will be for observation. I do not currently see an indication to continue antibiotics - admit to obs - monitor for s/s acute illness - hold home benzodiazepine; decrease hydrocodone frequency to 5 mg qd prn  # a-fib - rate controlled, inr therapeutic - pharmacy dose coumadin - daily inr  # left foot ulcer - shallow, does not appear infected - continue wound care - (this problem added as addendum 1/25 - will communicate this with pt's primary team)  # htn - here bp mildly elevated - resume home diltiazem and lisinopril - hold home prn torsemide/lasix and atorvastatin  # hyponatremia # cirrhosis - mild, stable. Seen by endo, thought to be 2/2 presumed cirrhosis, which appears to be compensated - outpt f/u  DVT prophylaxis: coumadin Code Status: dnr, confirmed w/ patient and family who were at bedside  Family  Communication: son Thereasa Solo Sapia [phone]  Disposition Plan: tbd  Consults called: none  Admission status: obs med/surg    Desma Maxim MD [facility] Pager [phone]  If 7PM-7AM, please contact night-coverage [email or web address] Password [facility]  05/04/2018, 11:30 PM

## 2018-05-04 NOTE — ED Triage Notes (Signed)
Patient daughter states patient has had confusion since yesterday. Patient also has a headache and high blood pressures since an hour ago.

## 2018-05-04 NOTE — ED Provider Notes (Signed)
[facility] DEPT Provider Note   CSN: [id number] Arrival date & time: 05/04/18  1928     History   Chief Complaint Chief Complaint  Patient presents with  . Altered Mental Status  . Headache  . Hypertension    HPI Brittany [name] is a 85 y.o. female with a past medical history of A. fib anticoagulated with warfarin, TIA, pulmonary hypertension status post aortic valve replacement, foot drop, who presents today for evaluation of confusion.  History is provided by patient and her daughter.  Patient has reportedly been confused since yesterday.  Her daughter states that she is "missing a day" and is not acting her normal.  She is normally "fully with it."  She reports that her head started hurting around 5pm and that her blood pressure has been up.  She denies any recent trauma.  She reports nausea with out vomiting.    She denies any recent sickness or illness.   HPI  Past Medical History:  Diagnosis Date  . Abnormal liver diagnostic imaging    suspected cirrhosis  . Arthritis   . Atrial fibrillation ([place])   . Chronic back pain   . Diverticulosis   . Dyslipidemia    takes Lipitor daily  . Dysrhythmia    Atrial Fibrillation  . Foot drop    SINCE 1987  . GERD (gastroesophageal reflux disease)   . H/O hiatal hernia   . Hearing impaired    Bilateral hearing aids  . History of bacterial endocarditis   . History of gastritis   . History of stomach ulcers    many yrs ago  . History of TIA (transient ischemic attack)    2013-- RESIDUAL PERIPHERAL VISION RIGHT EYE--  RESOLVED  . Hypertension   . IC (interstitial cystitis)   . Macular degeneration   . Pulmonary hypertension ([place]) 10/2013   Based on TTE  . S/P aortic valve replacement    2005  . Urine incontinence     Patient Active Problem List   Diagnosis Date Noted  . Confusion 05/04/2018  . Dementia ([place]) 05/04/2018  . Chronic ulcer of left foot ([place]) 05/04/2018  . CKD (chronic  kidney disease), stage III ([place]) 01/30/2018  . H/O hiatal hernia   . Allergic rhinitis 06/06/2017  . Mediastinal lymphadenopathy 06/06/2017  . Encounter for therapeutic drug monitoring 10/04/2016  . Colitis 09/29/2016  . Abdominal pain 09/29/2016  . Diarrhea 09/29/2016  . Chronic back pain 09/29/2016  . Dyspepsia 04/14/2016  . Cough 12/02/2014  . Emphysema lung ([place]) 12/02/2014  . Oral thrush 12/02/2014  . Multiple lung nodules on CT 10/07/2014  . Nausea without vomiting 09/30/2014  . Hyponatremia 08/27/2014  . Nausea & vomiting 08/27/2014  . Anemia 02/06/2014  . Perioral numbness 10/19/2013  . Headache(784.0) 10/12/2013  . Chronic diastolic heart failure ([place]) 10/12/2013  . Atelectasis 10/12/2013  . Pulmonary hypertension ([place]) 10/12/2013  . Cephalalgia   . Dizziness 10/11/2013  . Headache 10/11/2013  . S/P AVR (aortic valve replacement) 11/19/2012  . Abdominal pain, periumbilic 20/35/5974  . 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 Calculus of bile duct without mention of cholecystitis or obstruction 08/10/2012  . Chronic gastritis 07/16/2012  . Nausea alone 05/21/2012  . Abdominal pain, epigastric 05/21/2012  . Cellulitis of foot 01/01/2012  . Dysphagia, unspecified(787.20) 12/26/2011  . Esophageal reflux 12/26/2011  . Dyspnea on exertion 11/18/2011  . Chronic atrial fibrillation ([place]) 11/07/2011  . Chest pain 06/06/2011  . Aortic stenosis 03/24/2011  . Hypertension  03/24/2011  . H/O aortic valve replacement 10/02/2003    Past Surgical History:  Procedure Laterality Date  . ABDOMINAL HYSTERECTOMY  1967  . AORTIC VALVE REPLACEMENT  09-29-2003    [facility] pericardial tissue valve  . APPENDECTOMY  1933  . BIOPSY  02/01/2018   Procedure: BIOPSY;  Surgeon: Rush Landmark Telford Nab., MD;  Location: [place];  Service: Gastroenterology;;  . CARDIAC CATHETERIZATION  07-15-2003 DR HELEN PRESTON   MODERATE TO MODERATELY SEVERE CALCIFIC AORTIC STENOSIS/  NORMAL  CORONARY ARTERIES  . CARDIOVASCULAR STRESS TEST  01-30-2012  DR NASHER   NORMAL NUCLEAR STUDY/  EF 73%/  NORMAL LVF  . CARPAL TUNNEL RELEASE Bilateral   . CATARACT EXTRACTION W/ INTRAOCULAR LENS  IMPLANT, BILATERAL    . CHOLECYSTECTOMY  1993  . CYSTO WITH HYDRODISTENSION N/A 02/05/2013   Procedure: CYSTOSCOPY/HYDRODISTENSION;  Surgeon: Irine Seal, MD;  Location: [facility];  Service: Urology;  Laterality: N/A;  . CYSTO/ HYDRODISTENTION/ INSTILLATION CLORPACTIN  MULTIPLE  last one 2009  . DILATION AND CURETTAGE OF UTERUS    . ERCP N/A 08/10/2012   Procedure: ENDOSCOPIC RETROGRADE CHOLANGIOPANCREATOGRAPHY (ERCP);  Surgeon: Inda Castle, MD;  Location: [facility];  Service: Gastroenterology;  Laterality: N/A;  . ESOPHAGOGASTRODUODENOSCOPY (EGD) WITH PROPOFOL N/A 08/18/2014   Procedure: ESOPHAGOGASTRODUODENOSCOPY (EGD) WITH PROPOFOL;  Surgeon: Inda Castle, MD;  Location: [facility];  Service: Endoscopy;  Laterality: N/A;  . ESOPHAGOGASTRODUODENOSCOPY (EGD) WITH PROPOFOL N/A 02/01/2018   Procedure: ESOPHAGOGASTRODUODENOSCOPY (EGD) WITH PROPOFOL;  Surgeon: Rush Landmark Telford Nab., MD;  Location: [place] [facility];  Service: Gastroenterology;  Laterality: N/A;  . FOREIGN BODY REMOVAL  02/01/2018   Procedure: FOREIGN BODY REMOVAL;  Surgeon: Rush Landmark Telford Nab., MD;  Location: [facility];  Service: Gastroenterology;;  . LEFT HEART CATHETERIZATION WITH CORONARY ANGIOGRAM N/A 10/18/2013   Procedure: LEFT HEART CATHETERIZATION WITH CORONARY ANGIOGRAM;  Surgeon: Jettie Booze, MD;  Location: [facility] CATH LAB;  Service: Cardiovascular;  Laterality: N/A;  . LUMBAR [place] &  2007  . MACROPLASTIQUE URETHRAL IMPLANTATION  08-27-2009  . MINI-OPEN RIGHT ROTATOR CUFF REPAIR  07-12-2001  . RIGHT SHOULDER ARTHROSCOPY W/ DEBRIDEMENT ROTATOR CUFF AND LABRAL TEAR/ ACROMINOPLASTY/ DISTAL CLAVICLE EXCISION/ CA LIGAMENT RELEASE  10-08-1999  . TONSILLECTOMY AND ADENOIDECTOMY    .  TRANSTHORACIC ECHOCARDIOGRAM  08-10-2011   MILD LVH/  EF 55-60%/  NORMAL AVR TISSUE/ MILD MR/  MODERATE DILATED LA/  MILD DILATED RA  . TRANSVAGINAL TAPE PROCEDURE  07-30-2002  . VEIN LIGATION  1969   RIGHT LOWER LEG     OB History   No obstetric history on file.      Home Medications    Prior to Admission medications   Medication Sig Start Date End Date Taking? Authorizing Provider  atorvastatin (LIPITOR) 20 MG tablet Take 20 mg by mouth daily.   Yes [provider]  augmented betamethasone dipropionate (DIPROLENE-AF) 0.05 % cream Apply 1 application topically daily.  04/18/18  Yes [provider]  busPIRone (BUSPAR) 15 MG tablet Take 1 tablet (15 mg total) by mouth 2 (two) times daily. 03/16/18  Yes Armbruster, Carlota Raspberry, MD  cycloSPORINE (RESTASIS) 0.05 % ophthalmic emulsion Place 1 drop into both eyes 2 (two) times daily.   Yes [provider]  diazepam (VALIUM) 5 MG tablet Take 5 mg by mouth at bedtime.    Yes [provider]  diltiazem (CARDIZEM) 120 MG tablet Take 120 mg by mouth 2 (two) times daily.   Yes [provider]  famotidine (PEPCID) 40 MG tablet Take 40 mg by mouth daily.   Yes [provider]  HYDROcodone-acetaminophen (NORCO/VICODIN) 5-325 MG tablet Take 1 tablet by mouth every 6 (six) hours as needed for severe pain. 02/02/18  Yes Aline August, MD  lisinopril (PRINIVIL,ZESTRIL) 40 MG tablet Take 40 mg by mouth daily.    Yes [provider]  Multiple Vitamins-Minerals (ICAPS AREDS 2) CAPS Take 1 capsule by mouth 2 (two) times daily.   Yes [provider]  ondansetron (ZOFRAN) 8 MG tablet Take 1 tablet (8 mg total) by mouth 2 (two) times daily. Patient taking differently: Take 8 mg by mouth every 8 (eight) hours as needed for nausea or vomiting.  02/22/18  Yes Armbruster, Carlota Raspberry, MD  pantoprazole (PROTONIX) 20 MG tablet Take 1 tablet (20 mg total) by mouth daily. 04/19/18  Yes Shamleffer, Melanie Crazier, MD  Polyvinyl Alcohol-Povidone (REFRESH OP) Place 1 drop into both eyes 2 (two) times daily.   Yes [provider]  warfarin (COUMADIN) 5 MG tablet Take as directed by [facility] Patient taking differently: Take 2.5-5 mg by mouth. Take 2.5 mg every Tues and Sat & Take 5 mg all other days ake as directed by [facility] 04/12/18  Yes Nahser, Wonda Cheng, MD  furosemide (LASIX) 40 MG tablet Take 40 mg by mouth daily as needed for fluid.  03/15/18   [provider]  nitroGLYCERIN (NITROSTAT) 0.4 MG SL tablet Place 1 tablet (0.4 mg total) under the tongue every 5 (five) minutes as needed for chest pain. 02/02/18   Aline August, MD  polyethylene glycol (MIRALAX / GLYCOLAX) packet Take 17 g by mouth daily as needed for moderate constipation. 02/02/18   Aline August, MD  potassium chloride SA (K-DUR,KLOR-CON) 20 MEQ tablet Take 20 mEq by mouth daily as needed (ONLY WHEN TAKING TORSEMIDE).    [provider]  torsemide (DEMADEX) 20 MG tablet Take 20 mg by mouth daily as needed (retaining fluid).     [provider]    Family History Family History  Problem Relation Age of Onset  . Heart disease Father   . Stomach cancer Maternal Grandmother   . Esophageal cancer Maternal Grandfather   . Bipolar disorder Son        Committed Suicide  . Coronary artery disease Brother   . Bipolar disorder Brother        commited suiside at 13yo  . Alcohol abuse Sister        sister #1  . Alcohol abuse Sister        sister #2  . Colon cancer Maternal Aunt   . Lung disease Neg Hx     Social History Social History   Tobacco Use  . Smoking status: Former Smoker    Packs/day: 0.50    Years: 40.00    Pack years: 20.00    Types: Cigarettes    Start date: 05/06/1949    Last attempt to quit: 03/06/1990    Years since quitting: 28.1  . Smokeless tobacco: Never Used  Substance Use Topics  . Alcohol use: No    Alcohol/week: 0.0 standard drinks    Comment:  Remote social EtOH  . Drug use: No     Allergies   Amlodipine; Amoxicillin; Carafate [sucralfate]; Cephalexin; Codeine; Irbesartan; Morphine and related; Neurontin [gabapentin]; Nitrofurantoin monohyd macro; Prednisone; and Sulfa antibiotics   Review of Systems Review of Systems  Constitutional: Negative for chills and fever.  HENT: Negative for congestion.  Respiratory: Negative for chest tightness and shortness of breath.   Cardiovascular: Negative for chest pain.  Gastrointestinal: Negative for abdominal pain.  Genitourinary: Negative for dysuria.  Musculoskeletal: Negative for back pain and neck pain.  Neurological: Positive for headaches. Negative for weakness and numbness.  Psychiatric/Behavioral: Positive for confusion.  All other systems reviewed and are negative.    Physical Exam Updated Vital Signs BP 132/72   Pulse 65   Temp 98.4 F (36.9 C) (Oral)   Resp 17   Ht 5\' 1"  (1.549 m)   Wt 56.7 kg   SpO2 95%   BMI 23.62 kg/m   Physical Exam Vitals signs and nursing note reviewed.  Constitutional:      General: She is not in acute distress.    Appearance: She is well-developed.  HENT:     Head: Normocephalic and atraumatic.  Eyes:     Conjunctiva/sclera: Conjunctivae normal.  Neck:     Musculoskeletal: Neck supple.  Cardiovascular:     Rate and Rhythm: Normal rate and regular rhythm.     Heart sounds: No murmur.  Pulmonary:     Effort: Pulmonary effort is normal. No respiratory distress.     Breath sounds: Normal breath sounds.  Abdominal:     Palpations: Abdomen is soft.     Tenderness: There is no abdominal tenderness.  Skin:    General: Skin is warm and dry.  Neurological:     Mental Status: She is alert.     Comments: Oriented to person, and place, not to year or date.  Mental Status:  Alert, thought content appropriate, able to give a coherent history. Speech fluent without evidence of aphasia. Able to follow 2 step commands without difficulty.   Cranial Nerves:  II:   pupils equal, round, reactive to light III,IV, VI: ptosis not present, extra-ocular motions intact bilaterally  V,VII: smile symmetric, facial light touch sensation equal VIII: hearing baseline per patient X: uvula elevates symmetrically  XI: bilateral shoulder shrug symmetric and strong XII: midline tongue extension without fassiculations Motor:  Normal tone. 5/5 in upper bilateral extremities.  Chronic (unchanged according to pt and family) weakness of bilateral lower legs) CV: distal pulses palpable throughout        ED Treatments / Results  Labs (all labs ordered are listed, but only abnormal results are displayed) Labs Reviewed  COMPREHENSIVE METABOLIC PANEL - Abnormal; Notable for the following components:      Result Value   Sodium 134 (*)    Glucose, Bld 129 (*)    Calcium 8.7 (*)    All other components within normal limits  PROTIME-INR - Abnormal; Notable for the following components:   Prothrombin Time 24.7 (*)    All other components within normal limits  URINALYSIS, ROUTINE W REFLEX MICROSCOPIC - Abnormal; Notable for the following components:   Color, Urine STRAW (*)    Hgb urine dipstick MODERATE (*)    Protein, ur 30 (*)    Leukocytes, UA MODERATE (*)    Bacteria, UA RARE (*)    All other components within normal limits  CBG MONITORING, ED - Abnormal; Notable for the following components:   Glucose-Capillary 130 (*)    All other components within normal limits  URINE CULTURE  CBC    EKG None  Radiology Ct Head Wo Contrast  Result Date: 05/04/2018 CLINICAL DATA:  Confusion and headache EXAM: CT HEAD WITHOUT CONTRAST TECHNIQUE: Contiguous axial images were obtained from the base of the skull through the vertex  without intravenous contrast. COMPARISON:  04/03/2017 FINDINGS: Brain: Encephalomalacia secondary to remote left occipital lobe infarct is redemonstrated with extensive moderate to marked small vessel ischemic disease of  white matter. Superficial and central atrophy is stable. No acute intracranial hemorrhage, midline shift or edema. No new large vascular territory infarct. Midline fourth ventricle and basal cisterns without effacement. The brainstem and cerebellum are stable in appearance without acute abnormalities. No intra-axial mass nor extra-axial fluid. Vascular: No hyperdense vessel sign. Atherosclerosis of the carotid siphons bilaterally. Skull: Intact Sinuses/Orbits: Bilateral lens replacements. Mild ethmoid sinus mucosal thickening. Mastoids are clear. Other: None IMPRESSION: 1. Stable encephalomalacia secondary to remote left occipital lobe infarct with extensive small vessel ischemic disease of white matter. 2. No acute intracranial abnormality. Electronically Signed   By: Ashley Royalty M.D.   On: 05/04/2018 20:28    Procedures Procedures (including critical care time)  Medications Ordered in ED Medications  sodium chloride flush (NS) 0.9 % injection 3 mL (3 mLs Intravenous Given 05/04/18 1954)  cefTRIAXone (ROCEPHIN) 1 g in sodium chloride 0.9 % 100 mL IVPB (1 g Intravenous New Bag/Given 05/04/18 2257)     Initial Impression / Assessment and Plan / ED Course  I have reviewed the triage vital signs and the nursing notes.  Pertinent labs & imaging results that were available during my care of the patient were reviewed by me and considered in my medical decision making (see chart for details).  Clinical Course as of May 04 2332  Fri May 04, 2018  2236 I discussed allergies with patient and her family.  She does not have any true allergies according to patient, they are all side effects.  Most antibiotics give her nausea vomiting and diarrhea however she has taken penicillins and cephalosporins multiple times without true allergies.   [EH]  58 Spoke with hospitalist who will come see patient.     [EH]    Clinical Course User Index [EH] Lorin Glass, PA-C    [name] presents today  for evaluation of headache, HTN, and confusion.  She reports that her headache and nausea started around 5pm tonight.  She is oriented to person, and place but not to time which is different from her baseline according to family.  CT head does not show any acure intracranial abnormalities.  Does not have a significant leukocytosis or anemia.  Her creatinine is not elevated.  Sodium is slightly low at 134, however she does not otherwise have significant electrolyte derangements.  PT/INR is not significantly elevated.  Her urine shows moderate blood with 30 protein and moderate leukocytes.  Concerning for infection.  Chart has multiple allergies listed however patient states that she has taken all of these medications without difficulties.  This is confirmed with family that patient does not have true penicillin or cephalosporin allergies.  IV Rocephin was ordered after urine culture was obtained.  Patient does not meet Sirs or sepsis criteria, she is afebrile not tachycardic or tachypneic.  Given confusion, and urinary tract infection will pursue admission for IV antibiotics.  I spoke with the hospitalist who came to see the patient and will admit.   This patient was discussed with Dr. Alvino Chapel.   Final Clinical Impressions(s) / ED Diagnoses   Final diagnoses:  Confusion  Acute cystitis with hematuria    ED Discharge Orders    None       Ollen Gross 05/04/18 2336    Davonna Belling, MD 05/05/18 0003

## 2018-05-05 DIAGNOSIS — L97529 Non-pressure chronic ulcer of other part of left foot with unspecified severity: Secondary | ICD-10-CM

## 2018-05-05 DIAGNOSIS — Z952 Presence of prosthetic heart valve: Secondary | ICD-10-CM

## 2018-05-05 DIAGNOSIS — G9341 Metabolic encephalopathy: Secondary | ICD-10-CM | POA: Diagnosis not present

## 2018-05-05 DIAGNOSIS — N390 Urinary tract infection, site not specified: Secondary | ICD-10-CM | POA: Diagnosis not present

## 2018-05-05 DIAGNOSIS — I10 Essential (primary) hypertension: Secondary | ICD-10-CM

## 2018-05-05 DIAGNOSIS — I482 Chronic atrial fibrillation, unspecified: Secondary | ICD-10-CM

## 2018-05-05 DIAGNOSIS — E871 Hypo-osmolality and hyponatremia: Secondary | ICD-10-CM | POA: Diagnosis not present

## 2018-05-05 LAB — PROTIME-INR
INR: 2.51
Prothrombin Time: 26.7 seconds — ABNORMAL HIGH (ref 11.4–15.2)

## 2018-05-05 MED ORDER — HYDROCODONE-ACETAMINOPHEN 5-325 MG PO TABS
1.0000 | ORAL_TABLET | Freq: Every day | ORAL | Status: DC | PRN
  Administered 2018-05-07 – 2018-05-09 (×4): 1 via ORAL
  Filled 2018-05-05 (×4): qty 1

## 2018-05-05 MED ORDER — ACETAMINOPHEN 325 MG PO TABS
650.0000 mg | ORAL_TABLET | Freq: Four times a day (QID) | ORAL | Status: DC | PRN
  Administered 2018-05-06 – 2018-05-08 (×4): 650 mg via ORAL
  Filled 2018-05-05 (×4): qty 2

## 2018-05-05 MED ORDER — SODIUM CHLORIDE 0.9 % IV SOLN
1.0000 g | INTRAVENOUS | Status: DC
  Administered 2018-05-05 – 2018-05-06 (×2): 1 g via INTRAVENOUS
  Filled 2018-05-05 (×2): qty 1

## 2018-05-05 MED ORDER — SODIUM CHLORIDE 0.9 % IV SOLN: 250.0000 mL | INTRAVENOUS | Status: DC | PRN

## 2018-05-05 MED ORDER — SODIUM CHLORIDE 0.9% FLUSH
3.0000 mL | Freq: Two times a day (BID) | INTRAVENOUS | Status: DC
  Administered 2018-05-05 – 2018-05-06 (×3): 3 mL via INTRAVENOUS

## 2018-05-05 MED ORDER — BISACODYL 10 MG RE SUPP
10.0000 mg | Freq: Once | RECTAL | Status: AC
  Administered 2018-05-05: 10 mg via RECTAL
  Filled 2018-05-05: qty 1

## 2018-05-05 MED ORDER — WARFARIN SODIUM 2.5 MG PO TABS
2.5000 mg | ORAL_TABLET | Freq: Once | ORAL | Status: AC
  Administered 2018-05-05: 2.5 mg via ORAL
  Filled 2018-05-05: qty 1

## 2018-05-05 MED ORDER — DILTIAZEM HCL 90 MG PO TABS
120.0000 mg | ORAL_TABLET | Freq: Two times a day (BID) | ORAL | Status: DC
  Administered 2018-05-05 – 2018-05-10 (×12): 120 mg via ORAL
  Filled 2018-05-05 (×12): qty 1

## 2018-05-05 MED ORDER — ACETAMINOPHEN 650 MG RE SUPP: 650.0000 mg | Freq: Four times a day (QID) | RECTAL | Status: DC | PRN

## 2018-05-05 MED ORDER — HYDRALAZINE HCL 20 MG/ML IJ SOLN: 5.0000 mg | Freq: Four times a day (QID) | INTRAMUSCULAR | Status: DC | PRN

## 2018-05-05 MED ORDER — LISINOPRIL 20 MG PO TABS
40.0000 mg | ORAL_TABLET | Freq: Every day | ORAL | Status: DC
  Administered 2018-05-05 – 2018-05-10 (×6): 40 mg via ORAL
  Filled 2018-05-05 (×6): qty 2

## 2018-05-05 MED ORDER — PANTOPRAZOLE SODIUM 20 MG PO TBEC
20.0000 mg | DELAYED_RELEASE_TABLET | Freq: Every day | ORAL | Status: DC
  Administered 2018-05-05 – 2018-05-10 (×6): 20 mg via ORAL
  Filled 2018-05-05 (×6): qty 1

## 2018-05-05 MED ORDER — SENNOSIDES-DOCUSATE SODIUM 8.6-50 MG PO TABS
1.0000 | ORAL_TABLET | Freq: Two times a day (BID) | ORAL | Status: DC
  Administered 2018-05-05 – 2018-05-07 (×5): 1 via ORAL
  Filled 2018-05-05 (×6): qty 1

## 2018-05-05 MED ORDER — POLYETHYLENE GLYCOL 3350 17 G PO PACK
17.0000 g | PACK | Freq: Every day | ORAL | Status: DC
  Administered 2018-05-05 – 2018-05-07 (×3): 17 g via ORAL
  Filled 2018-05-05 (×3): qty 1

## 2018-05-05 MED ORDER — BUSPIRONE HCL 5 MG PO TABS
15.0000 mg | ORAL_TABLET | Freq: Two times a day (BID) | ORAL | Status: DC
  Administered 2018-05-05 – 2018-05-10 (×12): 15 mg via ORAL
  Filled 2018-05-05 (×13): qty 3

## 2018-05-05 MED ORDER — SODIUM CHLORIDE 0.9% FLUSH: 3.0000 mL | INTRAVENOUS | Status: DC | PRN

## 2018-05-05 MED ORDER — WARFARIN - PHARMACIST DOSING INPATIENT: Freq: Every day | Status: DC

## 2018-05-05 NOTE — Progress Notes (Signed)
ANTICOAGULATION CONSULT NOTE - Follow Up Consult  Pharmacy Consult for Warfarin Indication: atrial fibrillation  Allergies  Allergen Reactions  . Amlodipine Swelling and Other (See Comments)     Unspecified swelling reaction  . Amoxicillin Diarrhea, Nausea And Vomiting and Other (See Comments)    Has patient had a PCN reaction causing immediate rash, facial/tongue/throat swelling, SOB or lightheadedness with hypotension: No Has patient had a PCN reaction causing severe rash involving mucus membranes or skin necrosis: No Has patient had a PCN reaction that required hospitalization: No Has patient had a PCN reaction occurring within the last 10 years: No If all of the above answers are "NO", then may proceed with Cephalosporin use.  . Carafate [Sucralfate] Swelling and Other (See Comments)    Reaction:  Knee swelling/redness   . Cephalexin Diarrhea  . Codeine Nausea And Vomiting  . Irbesartan Swelling and Other (See Comments)    Reaction:  Facial/hand swelling and numbness   . Morphine And Related Other (See Comments)    Pt states that she has a history of addiction with this medication.      . Neurontin [Gabapentin] Swelling and Other (See Comments)    Reaction:  Leg swelling   . Nitrofurantoin Monohyd Macro Diarrhea and Nausea And Vomiting  . Prednisone Other (See Comments)    Reaction:  Elevated BP  . Sulfa Antibiotics Rash    Patient Measurements: Height: 5\' 1"  (154.9 cm) Weight: 125 lb (56.7 kg) IBW/kg (Calculated) : 47.8  Vital Signs: Temp: 97.9 F (36.6 C) (01/25 0605) Temp Source: Oral (01/25 0605) BP: 162/81 (01/25 0605) Pulse Rate: 56 (01/25 0605)  Labs: Recent Labs    05/04/18 1953 05/05/18 0530  HGB 13.0  --   HCT 39.9  --   PLT 213  --   LABPROT 24.7* 26.7*  INR 2.27 2.51  CREATININE 0.68  --     Estimated Creatinine Clearance: 36.7 mL/min (by C-G formula based on SCr of 0.68 mg/dL).   Medications:  Scheduled:  . busPIRone  15 mg Oral BID  .  diltiazem  120 mg Oral BID  . lisinopril  40 mg Oral Daily  . pantoprazole  20 mg Oral Daily  . sodium chloride flush  3 mL Intravenous Q12H   Infusions:  . sodium chloride      Assessment: 63 yoF admitted with altered mental status, HA and HTN on home warfarin for a-fib, tissue aortic valve replacement, TIA.  Pharmacy is consulted to continue warfarin dosing inpatient.  INR=2.27 on admission.  PTA warfarin dose: 2.5 mg Tu/Sat and 5 mg other days. LD 1/24  Today, 05/05/2018: INR 2.51 CBC - no new labs 1/25, WNL on 1/24 No bleeding or complications reported No major drug-drug interactions noted   Goal of Therapy:  INR 2-3 Monitor platelets by anticoagulation protocol: Yes   Plan:  Warfarin 2.5 mg PO x 1. Daily PT/INR. Monitor for signs and symptoms of bleeding.   Gretta Arab PharmD, BCPS Pager [phone] 05/05/2018 9:25 AM

## 2018-05-05 NOTE — Progress Notes (Addendum)
PROGRESS NOTE    Brittany [name]  ZDG:[id number] DOB: [birth year] DOA: 05/04/2018 PCP: Mayra Neer, MD   Brief Narrative:  Patient is a 85 year old female history of AAA status post aortic valve replacement, HTN, CVA, A. fib on Coumadin, chronic hyponatremia presented with worsening confusion.  Patient's family feels patient may have a urinary tract infection.  Head CT which was done with no acute abnormalities however consistent with extensive small vessel ischemic disease.  Urinalysis which was done was concerning for possible UTI.  Patient given IV Rocephin in the ED.  Patient admitted for further evaluation.   Assessment & Plan:   Active Problems:   Aortic stenosis   Hypertension   Chronic atrial fibrillation (HCC)   Hyponatremia   H/O aortic valve replacement   Confusion   Dementia (HCC)   Chronic ulcer of left foot ([place])  #1 acute encephalopathy/confusion Questionable etiology.  May be likely secondary to probable UTI.  Urinalysis done with moderate leukocytes, nitrite negative, 6-10 WBCs.  Patient afebrile.  Urine cultures pending.  WBC within normal limits.  Patient improving clinically per daughter.  Patient received a dose of IV Rocephin in the ED.  Will place back on IV Rocephin pending urine cultures as patient is improving clinically.  Patient has Valium on hold.  Supportive care.  Follow.  2.  Chronic atrial fibrillation Currently rate controlled.  INR at 2.51.  Coumadin for anticoagulation.  3.  Hypertension Systolic blood pressure 951/88.  Continue home regimen of Cardizem, lisinopril.  Hydralazine as needed.  Follow.  4.  Constipation Per RN patient complaining of constipation.  Place on Senokot twice daily.  MiraLAX daily.  Dulcolax suppository x 1.  Follow.  5.  Hyponatremia Repeat labs in the morning.  May need gentle hydration.  Follow.  6.  History of aortic valve replacement Outpatient follow-up with cardiology.  7.  Chronic ulcer of the left  foot Continue current wound care.  Outpatient follow-up with the wound care center.  Will   8. Probable UTI Urine cultures pending.  Patient received a dose of IV Rocephin in the ED.  Place on IV Rocephin daily pending urine cultures.  Follow.   DVT prophylaxis: Coumadin Code Status: DNR Family Communication: Updated patient and daughter at bedside. Disposition Plan: Likely home once clinically improved and mentation back to baseline.   Consultants:   None  Procedures:   CT head 05/04/2018  Antimicrobials:   IV Rocephin 05/05/2018   Subjective: Patient alert and oriented to self place.  Not sure about the year however no since January.  Knows who the president is.  Denies any chest pain or shortness of breath.  Daughter at bedside.  Per RN patient with some complaints of constipation.  Objective: Vitals:   05/05/18 0133 05/05/18 0605 05/05/18 1024 05/05/18 1238  BP: (!) 152/75 (!) 162/81 (!) 158/81 (!) 156/76  Pulse: 65 (!) 56 72 81  Resp: 18 16 16 16   Temp: 98.3 F (36.8 C) 97.9 F (36.6 C)  97.7 F (36.5 C)  TempSrc: Oral Oral  Oral  SpO2: 97% 98% 99% 97%  Weight:      Height:        Intake/Output Summary (Last 24 hours) at 05/05/2018 1247 Last data filed at 05/05/2018 0900 Gross per 24 hour  Intake 340 ml  Output 1200 ml  Net -860 ml   Filed Weights   05/04/18 1934  Weight: 56.7 kg    Examination:  General exam: Appears calm and comfortable  Respiratory system: Clear to auscultation. Respiratory effort normal. Cardiovascular system: Irregularly irregular. No JVD, murmurs, rubs, gallops or clicks. No pedal edema. Gastrointestinal system: Abdomen is nondistended, soft and nontender. No organomegaly or masses felt. Normal bowel sounds heard. Central nervous system: Alert and oriented. No focal neurological deficits. Extremities: Symmetric 5 x 5 power. Skin: No rashes, lesions or ulcers Psychiatry: Judgement and insight appear normal. Mood & affect  appropriate.     Data Reviewed: I have personally reviewed following labs and imaging studies  CBC: Recent Labs  Lab 05/04/18 1953  WBC 9.0  HGB 13.0  HCT 39.9  MCV 92.6  PLT 409   Basic Metabolic Panel: Recent Labs  Lab 05/04/18 1953  NA 134*  K 3.8  CL 101  CO2 24  GLUCOSE 129*  BUN 20  CREATININE 0.68  CALCIUM 8.7*   GFR: Estimated Creatinine Clearance: 36.7 mL/min (by C-G formula based on SCr of 0.68 mg/dL). Liver Function Tests: Recent Labs  Lab 05/04/18 1953  AST 25  ALT 18  ALKPHOS 70  BILITOT 0.8  PROT 8.0  ALBUMIN 4.2   No results for input(s): LIPASE, AMYLASE in the last 168 hours. No results for input(s): AMMONIA in the last 168 hours. Coagulation Profile: Recent Labs  Lab 05/04/18 1953 05/05/18 0530  INR 2.27 2.51   Cardiac Enzymes: No results for input(s): CKTOTAL, CKMB, CKMBINDEX, TROPONINI in the last 168 hours. BNP (last 3 results) No results for input(s): PROBNP in the last 8760 hours. HbA1C: No results for input(s): HGBA1C in the last 72 hours. CBG: Recent Labs  Lab 05/04/18 1949  GLUCAP 130*   Lipid Profile: No results for input(s): CHOL, HDL, LDLCALC, TRIG, CHOLHDL, LDLDIRECT in the last 72 hours. Thyroid Function Tests: No results for input(s): TSH, T4TOTAL, FREET4, T3FREE, THYROIDAB in the last 72 hours. Anemia Panel: No results for input(s): VITAMINB12, FOLATE, FERRITIN, TIBC, IRON, RETICCTPCT in the last 72 hours. Sepsis Labs: No results for input(s): PROCALCITON, LATICACIDVEN in the last 168 hours.  No results found for this or any previous visit (from the past 240 hour(s)).       Radiology Studies: Ct Head Wo Contrast  Result Date: 05/04/2018 CLINICAL DATA:  Confusion and headache EXAM: CT HEAD WITHOUT CONTRAST TECHNIQUE: Contiguous axial images were obtained from the base of the skull through the vertex without intravenous contrast. COMPARISON:  04/03/2017 FINDINGS: Brain: Encephalomalacia secondary to remote  left occipital lobe infarct is redemonstrated with extensive moderate to marked small vessel ischemic disease of white matter. Superficial and central atrophy is stable. No acute intracranial hemorrhage, midline shift or edema. No new large vascular territory infarct. Midline fourth ventricle and basal cisterns without effacement. The brainstem and cerebellum are stable in appearance without acute abnormalities. No intra-axial mass nor extra-axial fluid. Vascular: No hyperdense vessel sign. Atherosclerosis of the carotid siphons bilaterally. Skull: Intact Sinuses/Orbits: Bilateral lens replacements. Mild ethmoid sinus mucosal thickening. Mastoids are clear. Other: None IMPRESSION: 1. Stable encephalomalacia secondary to remote left occipital lobe infarct with extensive small vessel ischemic disease of white matter. 2. No acute intracranial abnormality. Electronically Signed   By: Ashley Royalty M.D.   On: 05/04/2018 20:28        Scheduled Meds: . busPIRone  15 mg Oral BID  . diltiazem  120 mg Oral BID  . lisinopril  40 mg Oral Daily  . pantoprazole  20 mg Oral Daily  . sodium chloride flush  3 mL Intravenous Q12H  . warfarin  2.5 mg Oral  ONCE-1800  . Warfarin - Pharmacist Dosing Inpatient   Does not apply q1800   Continuous Infusions: . sodium chloride    . cefTRIAXone (ROCEPHIN)  IV       LOS: 0 days    Time spent: 35 minutes    Irine Seal, MD [facility]  If 7PM-7AM, please contact night-coverage [email or web address] 05/05/2018, 12:47 PM

## 2018-05-06 DIAGNOSIS — E871 Hypo-osmolality and hyponatremia: Secondary | ICD-10-CM | POA: Diagnosis not present

## 2018-05-06 DIAGNOSIS — I10 Essential (primary) hypertension: Secondary | ICD-10-CM | POA: Diagnosis not present

## 2018-05-06 DIAGNOSIS — N390 Urinary tract infection, site not specified: Secondary | ICD-10-CM | POA: Diagnosis not present

## 2018-05-06 DIAGNOSIS — I482 Chronic atrial fibrillation, unspecified: Secondary | ICD-10-CM | POA: Diagnosis not present

## 2018-05-06 DIAGNOSIS — Z952 Presence of prosthetic heart valve: Secondary | ICD-10-CM | POA: Diagnosis not present

## 2018-05-06 DIAGNOSIS — G9341 Metabolic encephalopathy: Secondary | ICD-10-CM | POA: Diagnosis not present

## 2018-05-06 DIAGNOSIS — L97529 Non-pressure chronic ulcer of other part of left foot with unspecified severity: Secondary | ICD-10-CM | POA: Diagnosis not present

## 2018-05-06 LAB — BASIC METABOLIC PANEL
Anion gap: 8 (ref 5–15)
BUN: 12 mg/dL (ref 8–23)
CHLORIDE: 102 mmol/L (ref 98–111)
CO2: 23 mmol/L (ref 22–32)
Calcium: 8.6 mg/dL — ABNORMAL LOW (ref 8.9–10.3)
Creatinine, Ser: 0.56 mg/dL (ref 0.44–1.00)
GFR calc Af Amer: >60 mL/min (ref >60)
GFR calc non Af Amer: >60 mL/min (ref >60)
Glucose, Bld: 104 mg/dL — ABNORMAL HIGH (ref 70–99)
Potassium: 3.5 mmol/L (ref 3.5–5.1)
Sodium: 133 mmol/L — ABNORMAL LOW (ref 135–145)

## 2018-05-06 LAB — CBC WITH DIFFERENTIAL/PLATELET
Abs Immature Granulocytes: 0.04 10*3/uL (ref 0.00–0.07)
BASOS PCT: 1 %
Basophils Absolute: 0.1 10*3/uL (ref 0.0–0.1)
Eosinophils Absolute: 0.2 10*3/uL (ref 0.0–0.5)
Eosinophils Relative: 2 %
HCT: 39.2 % (ref 36.0–46.0)
Hemoglobin: 13 g/dL (ref 12.0–15.0)
Immature Granulocytes: 0 %
Lymphocytes Relative: 19 %
Lymphs Abs: 2 10*3/uL (ref 0.7–4.0)
MCH: 29.3 pg (ref 26.0–34.0)
MCHC: 33.2 g/dL (ref 30.0–36.0)
MCV: 88.3 fL (ref 80.0–100.0)
Monocytes Absolute: 1.1 10*3/uL — ABNORMAL HIGH (ref 0.1–1.0)
Monocytes Relative: 11 %
NRBC: 0 % (ref 0.0–0.2)
Neutro Abs: 7.2 10*3/uL (ref 1.7–7.7)
Neutrophils Relative %: 67 %
Platelets: 252 10*3/uL (ref 150–400)
RBC: 4.44 MIL/uL (ref 3.87–5.11)
RDW: 14.1 % (ref 11.5–15.5)
WBC: 10.6 10*3/uL — ABNORMAL HIGH (ref 4.0–10.5)

## 2018-05-06 LAB — PROTIME-INR
INR: 2.64
Prothrombin Time: 27.8 seconds — ABNORMAL HIGH (ref 11.4–15.2)

## 2018-05-06 MED ORDER — POTASSIUM CHLORIDE CRYS ER 20 MEQ PO TBCR
40.0000 meq | EXTENDED_RELEASE_TABLET | Freq: Every day | ORAL | Status: DC
  Administered 2018-05-06 – 2018-05-08 (×3): 40 meq via ORAL
  Filled 2018-05-06 (×3): qty 2

## 2018-05-06 MED ORDER — WARFARIN SODIUM 2.5 MG PO TABS
2.5000 mg | ORAL_TABLET | Freq: Once | ORAL | Status: AC
  Administered 2018-05-06: 2.5 mg via ORAL
  Filled 2018-05-06: qty 1

## 2018-05-06 MED ORDER — POTASSIUM CHLORIDE CRYS ER 20 MEQ PO TBCR
40.0000 meq | EXTENDED_RELEASE_TABLET | Freq: Once | ORAL | Status: AC
  Administered 2018-05-06: 40 meq via ORAL
  Filled 2018-05-06: qty 2

## 2018-05-06 MED ORDER — ALUM & MAG HYDROXIDE-SIMETH 200-200-20 MG/5ML PO SUSP
30.0000 mL | ORAL | Status: DC | PRN
  Administered 2018-05-06 – 2018-05-08 (×4): 30 mL via ORAL
  Filled 2018-05-06 (×4): qty 30

## 2018-05-06 MED ORDER — TORSEMIDE 20 MG PO TABS
20.0000 mg | ORAL_TABLET | Freq: Every day | ORAL | Status: DC
  Administered 2018-05-06 – 2018-05-07 (×2): 20 mg via ORAL
  Filled 2018-05-06 (×2): qty 1

## 2018-05-06 NOTE — Progress Notes (Signed)
PROGRESS NOTE    Brittany [name]  AYT:[id number] DOB: 01-Feb-1928 DOA: 05/04/2018 PCP: Mayra Neer, MD   Brief Narrative:  Patient is a 85 year old female history of AAA status post aortic valve replacement, HTN, CVA, A. fib on Coumadin, chronic hyponatremia presented with worsening confusion.  Patient's family feels patient may have a urinary tract infection.  Head CT which was done with no acute abnormalities however consistent with extensive small vessel ischemic disease.  Urinalysis which was done was concerning for possible UTI.  Patient given IV Rocephin in the ED.  Patient admitted for further evaluation.   Assessment & Plan:   Active Problems:   Aortic stenosis   Hypertension   Chronic atrial fibrillation (HCC)   Hyponatremia   H/O aortic valve replacement   Confusion   Dementia (HCC)   Chronic ulcer of left foot (HCC)   Acute metabolic encephalopathy   Acute lower UTI  1 acute encephalopathy/confusion Questionable etiology.  May be likely secondary to probable UTI.  Urinalysis done with moderate leukocytes, nitrite negative, 6-10 WBCs.  Patient afebrile.  Urine cultures pending with preliminary results of 50,000 colonies of Enterococcus faecalis.  Sensitivities pending.  WBC within normal limits.  Patient improving clinically per daughter.  Patient started on IV Rocephin pending sensitivities and culture results.  Valium on hold.  Supportive care.  Follow.   2.  Chronic atrial fibrillation Currently rate controlled.  INR at 2.64.  Coumadin for anticoagulation.  3.  Hypertension Systolic blood pressure evaded and ranging from 149/72-169/102.  Continue current regimen of Cardizem and lisinopril.  Per last cardiologist note patient is on Demadex 20 mg daily which we will resume.  Saline lock IV fluids.  Hydralazine as needed.  Follow.  4.  Constipation Per RN patient complaining of constipation.  Continue current regimen of Senokot twice daily and MiraLAX daily.  Follow.     5.  Hyponatremia Repeat labs with improvement with hyponatremia.  Saline lock IV fluids.  Follow.   6.  History of aortic valve replacement Outpatient follow-up with cardiology.  7.  Chronic ulcer of the left foot Continue current wound care.  Outpatient follow-up with the wound care center.  Will   8. Probable UTI Urine cultures pending.  Patient received a dose of IV Rocephin in the ED. continue IV Rocephin.   DVT prophylaxis: Coumadin Code Status: DNR Family Communication: Updated patient.  No family present. Disposition Plan: Likely home once clinically improved and mentation back to baseline.   Consultants:   None  Procedures:   CT head 05/04/2018  Antimicrobials:   IV Rocephin 05/05/2018   Subjective: Patient laying in bed eating her lunch.  Denies any chest pain or shortness of breath.  Alert.  Confusion improving.   Objective: Vitals:   05/05/18 1238 05/05/18 2119 05/06/18 0546 05/06/18 0945  BP: (!) 156/76 (!) 170/76 (!) 149/72 (!) 169/102  Pulse: 81 81 77   Resp: 16 16 16    Temp: 97.7 F (36.5 C) 99.5 F (37.5 C) 97.9 F (36.6 C)   TempSrc: Oral Oral Oral   SpO2: 97% 99% 98%   Weight:      Height:        Intake/Output Summary (Last 24 hours) at 05/06/2018 1248 Last data filed at 05/06/2018 1001 Gross per 24 hour  Intake 702.38 ml  Output 300 ml  Net 402.38 ml   Filed Weights   05/04/18 1934  Weight: 56.7 kg    Examination:  General exam: Appears calm and comfortable  Respiratory system: Clear to auscultation. Respiratory effort normal. Cardiovascular system: Regular rate rhythm no murmurs rubs or gallops.  No JVD.  No lower extremity edema.   Gastrointestinal system: Abdomen is soft, nontender, nondistended, positive bowel sounds.  No rebound.  No guarding.  Central nervous system: Alert and oriented. No focal neurological deficits. Extremities: Symmetric 5 x 5 power. Skin: No rashes, lesions or ulcers Psychiatry: Judgement and insight  appear fair. Mood & affect appropriate.     Data Reviewed: I have personally reviewed following labs and imaging studies  CBC: Recent Labs  Lab 05/04/18 1953 05/06/18 0455  WBC 9.0 10.6*  NEUTROABS  --  7.2  HGB 13.0 13.0  HCT 39.9 39.2  MCV 92.6 88.3  PLT 213 789   Basic Metabolic Panel: Recent Labs  Lab 05/04/18 1953 05/06/18 0455  NA 134* 133*  K 3.8 3.5  CL 101 102  CO2 24 23  GLUCOSE 129* 104*  BUN 20 12  CREATININE 0.68 0.56  CALCIUM 8.7* 8.6*   GFR: Estimated Creatinine Clearance: 36 mL/min (by C-G formula based on SCr of 0.56 mg/dL). Liver Function Tests: Recent Labs  Lab 05/04/18 1953  AST 25  ALT 18  ALKPHOS 70  BILITOT 0.8  PROT 8.0  ALBUMIN 4.2   No results for input(s): LIPASE, AMYLASE in the last 168 hours. No results for input(s): AMMONIA in the last 168 hours. Coagulation Profile: Recent Labs  Lab 05/04/18 1953 05/05/18 0530 05/06/18 0455  INR 2.27 2.51 2.64   Cardiac Enzymes: No results for input(s): CKTOTAL, CKMB, CKMBINDEX, TROPONINI in the last 168 hours. BNP (last 3 results) No results for input(s): PROBNP in the last 8760 hours. HbA1C: No results for input(s): HGBA1C in the last 72 hours. CBG: Recent Labs  Lab 05/04/18 1949  GLUCAP 130*   Lipid Profile: No results for input(s): CHOL, HDL, LDLCALC, TRIG, CHOLHDL, LDLDIRECT in the last 72 hours. Thyroid Function Tests: No results for input(s): TSH, T4TOTAL, FREET4, T3FREE, THYROIDAB in the last 72 hours. Anemia Panel: No results for input(s): VITAMINB12, FOLATE, FERRITIN, TIBC, IRON, RETICCTPCT in the last 72 hours. Sepsis Labs: No results for input(s): PROCALCITON, LATICACIDVEN in the last 168 hours.  Recent Results (from the past 240 hour(s))  Urine culture     Status: Abnormal (Preliminary result)   Collection Time: 05/04/18  9:04 PM  Result Value Ref Range Status   Specimen Description URINE, CLEAN CATCH  Final   Special Requests   Final    Normal Performed at  [facility] [address] [place], [place] [address]    Culture 50,000 COLONIES/mL UNIDENTIFIED ORGANISM (A)  Final   Report Status PENDING  Incomplete         Radiology Studies: Ct Head Wo Contrast  Result Date: 05/04/2018 CLINICAL DATA:  Confusion and headache EXAM: CT HEAD WITHOUT CONTRAST TECHNIQUE: Contiguous axial images were obtained from the [name] of the skull through the vertex without intravenous contrast. COMPARISON:  04/03/2017 FINDINGS: Brain: Encephalomalacia secondary to remote left occipital lobe infarct is redemonstrated with extensive moderate to marked small vessel ischemic disease of white matter. Superficial and central atrophy is stable. No acute intracranial hemorrhage, midline shift or edema. No new large vascular territory infarct. Midline fourth ventricle and basal cisterns without effacement. The brainstem and cerebellum are stable in appearance without acute abnormalities. No intra-axial mass nor extra-axial fluid. Vascular: No hyperdense vessel sign. Atherosclerosis of the carotid siphons bilaterally. Skull: Intact Sinuses/Orbits: Bilateral lens replacements. Mild ethmoid sinus  mucosal thickening. Mastoids are clear. Other: None IMPRESSION: 1. Stable encephalomalacia secondary to remote left occipital lobe infarct with extensive small vessel ischemic disease of white matter. 2. No acute intracranial abnormality. Electronically Signed   By: Ashley Royalty M.D.   On: 05/04/2018 20:28        Scheduled Meds: . busPIRone  15 mg Oral BID  . diltiazem  120 mg Oral BID  . lisinopril  40 mg Oral Daily  . pantoprazole  20 mg Oral Daily  . polyethylene glycol  17 g Oral Daily  . senna-docusate  1 tablet Oral BID  . sodium chloride flush  3 mL Intravenous Q12H  . warfarin  2.5 mg Oral ONCE-1800  . Warfarin - Pharmacist Dosing Inpatient   Does not apply q1800   Continuous Infusions: . sodium chloride    . cefTRIAXone (ROCEPHIN)  IV 1 g (05/05/18  2239)     LOS: 0 days    Time spent: 35 minutes    Irine Seal, MD [facility]  If 7PM-7AM, please contact night-coverage [email or web address] 05/06/2018, 12:48 PM

## 2018-05-06 NOTE — Progress Notes (Signed)
ANTICOAGULATION CONSULT NOTE - Follow Up Consult  Pharmacy Consult for Warfarin Indication: atrial fibrillation  Allergies  Allergen Reactions  . Amlodipine Swelling and Other (See Comments)     Unspecified swelling reaction  . Amoxicillin Diarrhea, Nausea And Vomiting and Other (See Comments)    Has patient had a PCN reaction causing immediate rash, facial/tongue/throat swelling, SOB or lightheadedness with hypotension: No Has patient had a PCN reaction causing severe rash involving mucus membranes or skin necrosis: No Has patient had a PCN reaction that required hospitalization: No Has patient had a PCN reaction occurring within the last 10 years: No If all of the above answers are "NO", then may proceed with Cephalosporin use.  . Carafate [Sucralfate] Swelling and Other (See Comments)    Reaction:  Knee swelling/redness   . Cephalexin Diarrhea  . Codeine Nausea And Vomiting  . Irbesartan Swelling and Other (See Comments)    Reaction:  Facial/hand swelling and numbness   . Morphine And Related Other (See Comments)    Pt states that she has a history of addiction with this medication.      . Neurontin [Gabapentin] Swelling and Other (See Comments)    Reaction:  Leg swelling   . Nitrofurantoin Monohyd Macro Diarrhea and Nausea And Vomiting  . Prednisone Other (See Comments)    Reaction:  Elevated BP  . Sulfa Antibiotics Rash    Patient Measurements: Height: 5\' 1"  (154.9 cm) Weight: 125 lb (56.7 kg) IBW/kg (Calculated) : 47.8  Vital Signs: Temp: 97.9 F (36.6 C) (01/26 0546) Temp Source: Oral (01/26 0546) BP: 169/102 (01/26 0945) Pulse Rate: 77 (01/26 0546)  Labs: Recent Labs    05/04/18 1953 05/05/18 0530 05/06/18 0455  HGB 13.0  --  13.0  HCT 39.9  --  39.2  PLT 213  --  252  LABPROT 24.7* 26.7* 27.8*  INR 2.27 2.51 2.64  CREATININE 0.68  --  0.56    Estimated Creatinine Clearance: 36 mL/min (by C-G formula based on SCr of 0.56 mg/dL).   Medications:   Scheduled:  . busPIRone  15 mg Oral BID  . diltiazem  120 mg Oral BID  . lisinopril  40 mg Oral Daily  . pantoprazole  20 mg Oral Daily  . polyethylene glycol  17 g Oral Daily  . senna-docusate  1 tablet Oral BID  . sodium chloride flush  3 mL Intravenous Q12H  . Warfarin - Pharmacist Dosing Inpatient   Does not apply q1800   Infusions:  . sodium chloride    . cefTRIAXone (ROCEPHIN)  IV 1 g (05/05/18 2239)    Assessment: 56 yoF admitted with altered mental status, HA and HTN on home warfarin for a-fib, tissue aortic valve replacement, TIA.  Pharmacy is consulted to continue warfarin dosing inpatient.  PTA warfarin dose: 2.5 mg Tu/Sat and 5 mg other days. LD 1/24. INR 2.27 on admission.   Today, 05/06/2018: INR 2.64 CBC - Hgb and Plt WNL and stable. No bleeding or complications reported No major drug-drug interactions noted   Goal of Therapy:  INR 2-3 Monitor platelets by anticoagulation protocol: Yes   Plan:  Warfarin 2.5 mg PO x 1. Daily PT/INR. Monitor for signs and symptoms of bleeding.   Gretta Arab PharmD, BCPS Pager [phone] 05/06/2018 10:06 AM

## 2018-05-06 NOTE — Progress Notes (Signed)
Patient ambulated the full length of the hall and back, tolerated well. She then sat up in the chair for a couple hours.

## 2018-05-07 DIAGNOSIS — H3530 Unspecified macular degeneration: Secondary | ICD-10-CM | POA: Diagnosis present

## 2018-05-07 DIAGNOSIS — I350 Nonrheumatic aortic (valve) stenosis: Secondary | ICD-10-CM

## 2018-05-07 DIAGNOSIS — K219 Gastro-esophageal reflux disease without esophagitis: Secondary | ICD-10-CM | POA: Diagnosis present

## 2018-05-07 DIAGNOSIS — R05 Cough: Secondary | ICD-10-CM | POA: Diagnosis not present

## 2018-05-07 DIAGNOSIS — M549 Dorsalgia, unspecified: Secondary | ICD-10-CM | POA: Diagnosis present

## 2018-05-07 DIAGNOSIS — Z66 Do not resuscitate: Secondary | ICD-10-CM | POA: Diagnosis present

## 2018-05-07 DIAGNOSIS — N390 Urinary tract infection, site not specified: Secondary | ICD-10-CM | POA: Diagnosis not present

## 2018-05-07 DIAGNOSIS — E860 Dehydration: Secondary | ICD-10-CM | POA: Diagnosis not present

## 2018-05-07 DIAGNOSIS — G9341 Metabolic encephalopathy: Secondary | ICD-10-CM | POA: Diagnosis present

## 2018-05-07 DIAGNOSIS — N3010 Interstitial cystitis (chronic) without hematuria: Secondary | ICD-10-CM | POA: Diagnosis present

## 2018-05-07 DIAGNOSIS — I272 Pulmonary hypertension, unspecified: Secondary | ICD-10-CM | POA: Diagnosis present

## 2018-05-07 DIAGNOSIS — K5900 Constipation, unspecified: Secondary | ICD-10-CM | POA: Diagnosis not present

## 2018-05-07 DIAGNOSIS — L97529 Non-pressure chronic ulcer of other part of left foot with unspecified severity: Secondary | ICD-10-CM | POA: Diagnosis not present

## 2018-05-07 DIAGNOSIS — N183 Chronic kidney disease, stage 3 (moderate): Secondary | ICD-10-CM | POA: Diagnosis present

## 2018-05-07 DIAGNOSIS — R4182 Altered mental status, unspecified: Secondary | ICD-10-CM | POA: Diagnosis not present

## 2018-05-07 DIAGNOSIS — M21379 Foot drop, unspecified foot: Secondary | ICD-10-CM | POA: Diagnosis present

## 2018-05-07 DIAGNOSIS — E785 Hyperlipidemia, unspecified: Secondary | ICD-10-CM | POA: Diagnosis present

## 2018-05-07 DIAGNOSIS — E875 Hyperkalemia: Secondary | ICD-10-CM | POA: Diagnosis not present

## 2018-05-07 DIAGNOSIS — X58XXXD Exposure to other specified factors, subsequent encounter: Secondary | ICD-10-CM | POA: Diagnosis present

## 2018-05-07 DIAGNOSIS — K2950 Unspecified chronic gastritis without bleeding: Secondary | ICD-10-CM | POA: Diagnosis present

## 2018-05-07 DIAGNOSIS — N3001 Acute cystitis with hematuria: Secondary | ICD-10-CM | POA: Diagnosis present

## 2018-05-07 DIAGNOSIS — I130 Hypertensive heart and chronic kidney disease with heart failure and stage 1 through stage 4 chronic kidney disease, or unspecified chronic kidney disease: Secondary | ICD-10-CM | POA: Diagnosis present

## 2018-05-07 DIAGNOSIS — Z952 Presence of prosthetic heart valve: Secondary | ICD-10-CM | POA: Diagnosis not present

## 2018-05-07 DIAGNOSIS — I5032 Chronic diastolic (congestive) heart failure: Secondary | ICD-10-CM | POA: Diagnosis present

## 2018-05-07 DIAGNOSIS — L97521 Non-pressure chronic ulcer of other part of left foot limited to breakdown of skin: Secondary | ICD-10-CM | POA: Diagnosis present

## 2018-05-07 DIAGNOSIS — G8929 Other chronic pain: Secondary | ICD-10-CM | POA: Diagnosis present

## 2018-05-07 DIAGNOSIS — F0390 Unspecified dementia without behavioral disturbance: Secondary | ICD-10-CM | POA: Diagnosis present

## 2018-05-07 DIAGNOSIS — I10 Essential (primary) hypertension: Secondary | ICD-10-CM | POA: Diagnosis not present

## 2018-05-07 DIAGNOSIS — K7460 Unspecified cirrhosis of liver: Secondary | ICD-10-CM | POA: Diagnosis present

## 2018-05-07 DIAGNOSIS — I482 Chronic atrial fibrillation, unspecified: Secondary | ICD-10-CM | POA: Diagnosis present

## 2018-05-07 DIAGNOSIS — E871 Hypo-osmolality and hyponatremia: Secondary | ICD-10-CM | POA: Diagnosis present

## 2018-05-07 DIAGNOSIS — B952 Enterococcus as the cause of diseases classified elsewhere: Secondary | ICD-10-CM | POA: Diagnosis present

## 2018-05-07 LAB — BASIC METABOLIC PANEL
Anion gap: 11 (ref 5–15)
BUN: 21 mg/dL (ref 8–23)
CO2: 25 mmol/L (ref 22–32)
CREATININE: 0.63 mg/dL (ref 0.44–1.00)
Calcium: 8.9 mg/dL (ref 8.9–10.3)
Chloride: 94 mmol/L — ABNORMAL LOW (ref 98–111)
GFR calc Af Amer: >60 mL/min (ref >60)
GFR calc non Af Amer: >60 mL/min (ref >60)
Glucose, Bld: 108 mg/dL — ABNORMAL HIGH (ref 70–99)
Potassium: 4.2 mmol/L (ref 3.5–5.1)
SODIUM: 130 mmol/L — AB (ref 135–145)

## 2018-05-07 LAB — CBC
HCT: 41.5 % (ref 36.0–46.0)
Hemoglobin: 13.6 g/dL (ref 12.0–15.0)
MCH: 29.4 pg (ref 26.0–34.0)
MCHC: 32.8 g/dL (ref 30.0–36.0)
MCV: 89.8 fL (ref 80.0–100.0)
Platelets: 266 10*3/uL (ref 150–400)
RBC: 4.62 MIL/uL (ref 3.87–5.11)
RDW: 14.3 % (ref 11.5–15.5)
WBC: 12.1 10*3/uL — ABNORMAL HIGH (ref 4.0–10.5)
nRBC: 0 % (ref 0.0–0.2)

## 2018-05-07 LAB — PROTIME-INR
INR: 2.08
Prothrombin Time: 23.1 seconds — ABNORMAL HIGH (ref 11.4–15.2)

## 2018-05-07 MED ORDER — SODIUM CHLORIDE 0.9 % IV SOLN: INTRAVENOUS | Status: DC

## 2018-05-07 MED ORDER — FOSFOMYCIN TROMETHAMINE 3 G PO PACK
3.0000 g | PACK | Freq: Once | ORAL | Status: AC
  Administered 2018-05-07: 3 g via ORAL
  Filled 2018-05-07: qty 3

## 2018-05-07 MED ORDER — SODIUM CHLORIDE 0.9 % IV SOLN
INTRAVENOUS | Status: DC
  Administered 2018-05-07: 22:00:00 via INTRAVENOUS

## 2018-05-07 MED ORDER — WARFARIN SODIUM 2.5 MG PO TABS: 2.5000 mg | ORAL_TABLET | ORAL | Status: DC

## 2018-05-07 MED ORDER — WARFARIN SODIUM 5 MG PO TABS
5.0000 mg | ORAL_TABLET | ORAL | Status: DC
  Administered 2018-05-07: 5 mg via ORAL
  Filled 2018-05-07: qty 1

## 2018-05-07 NOTE — Progress Notes (Signed)
Patient ambulated to the nursing station and back to her room. States she's feeling tired.Sat up in the chair for lunch.

## 2018-05-07 NOTE — Progress Notes (Signed)
PROGRESS NOTE    Brittany THIBEAUX  TDV:[id number] DOB: Oct 27, 1927 DOA: 05/04/2018 PCP: Mayra Neer, MD   Brief Narrative:  Patient is a 85 year old female history of AAA status post aortic valve replacement, HTN, CVA, A. fib on Coumadin, chronic hyponatremia presented with worsening confusion.  Patient's family feels patient may have a urinary tract infection.  Head CT which was done with no acute abnormalities however consistent with extensive small vessel ischemic disease.  Urinalysis which was done was concerning for possible UTI.  Patient given IV Rocephin in the ED.  Patient admitted for further evaluation.   Assessment & Plan:   Active Problems:   Aortic stenosis   Hypertension   Chronic atrial fibrillation (HCC)   Hyponatremia   H/O aortic valve replacement   Confusion   Dementia (HCC)   Chronic ulcer of left foot (HCC)   Acute metabolic encephalopathy   Acute lower UTI  1 acute encephalopathy/confusion Questionable etiology.  May be likely secondary to probable UTI.  Urinalysis done with moderate leukocytes, nitrite negative, 6-10 WBCs.  Patient afebrile.  Urine cultures pending with preliminary results of 50,000 colonies of Enterococcus faecalis.  Sensitivities pending.  WBC within normal limits.  Patient improving clinically per daughter.  Patient started on IV Rocephin.discontinue IV Rocephin.  Give a dose of fosfomycin.  Valium on hold.  Supportive care.  Follow.    2.  Chronic atrial fibrillation Currently rate controlled.  INR at 2.08.  Coumadin for anticoagulation.  3.  Hypertension Systolic blood pressure evaded and ranging from 149/72-169/102.  Continue current regimen of Cardizem and lisinopril.  Per last cardiologist note patient is on Demadex 20 mg daily which was resumed last night.  Patient however somewhat lethargic today.  Patient already received a dose of Demadex.  Blood pressure improved.  Hold Demadex.  Hydralazine as needed. Follow.  4.   Constipation Continue current regimen of Senokot twice daily and MiraLAX daily.  Follow.   5.  Hyponatremia Repeat labs with improvement with hyponatremia.  Saline lock IV fluids.  Hold Demadex.  Follow.   6.  History of aortic valve replacement Outpatient follow-up with cardiology.  7.  Chronic ulcer of the left foot Continue current wound care.  Outpatient follow-up with the wound care center.  Will   8.  Enterococcus faecalis uncomplicated UTI Urine cultures pending.  Patient was on IV Rocephin however urine cultures growing Enterococcus faecalis with sensitivities pending.  Discussed with pharmacist and as such due to culture of Enterococcus faecalis will discontinue IV Rocephin and given a dose of oral fosfomycin.  9.  Lethargy Patient somewhat lethargic this morning.  Patient stated he was up all night urinating and as such did not get that much sleep.  Patient was started last night on her home regimen of Demadex and likely cause of patient's lethargy.  Patient already received Demadex this morning.  Hold Demadex.  Could likely resume in the outpatient setting.   DVT prophylaxis: Coumadin Code Status: DNR Family Communication: Updated patient and daughter at bedside. Disposition Plan: Likely home once clinically improved and mentation back to baseline with improvement in lethargy hopefully tomorrow.   Consultants:   None  Procedures:   CT head 05/04/2018  Antimicrobials:   IV Rocephin 05/05/2018>>>> 05/07/2018  Fosfomycin 05/07/2018   Subjective: Patient sitting up in chair.  Seems somewhat lethargic and exhausted.  Patient stated that she peed all night and did not get significant sleep.  Confusion improved.  Objective: Vitals:   05/07/18 0541 05/07/18  1032 05/07/18 1034 05/07/18 1035  BP: (!) 149/82 139/76 139/76 139/76  Pulse: 90     Resp: 16     Temp: 98.6 F (37 C)     TempSrc: Oral     SpO2: 97% 97%    Weight:      Height:        Intake/Output Summary  (Last 24 hours) at 05/07/2018 1153 Last data filed at 05/07/2018 0918 Gross per 24 hour  Intake 1047.12 ml  Output -  Net 1047.12 ml   Filed Weights   05/04/18 1934  Weight: 56.7 kg    Examination:  General exam: Lethargic. Respiratory system: CTAB.  No wheezes, no crackles, no rhonchi.  Respiratory effort normal. Cardiovascular system: RRR no murmurs rubs or gallops.  No JVD.  No lower extremity edema.  Gastrointestinal system: Abdomen is nontender, nondistended, soft, positive bowel sounds.  No rebound.  No guarding.  Central nervous system: Alert and oriented. No focal neurological deficits. Extremities: Symmetric 5 x 5 power. Skin: No rashes, lesions or ulcers Psychiatry: Judgement and insight appear fair. Mood & affect appropriate.     Data Reviewed: I have personally reviewed following labs and imaging studies  CBC: Recent Labs  Lab 05/04/18 1953 05/06/18 0455 05/07/18 0449  WBC 9.0 10.6* 12.1*  NEUTROABS  --  7.2  --   HGB 13.0 13.0 13.6  HCT 39.9 39.2 41.5  MCV 92.6 88.3 89.8  PLT 213 252 546   Basic Metabolic Panel: Recent Labs  Lab 05/04/18 1953 05/06/18 0455 05/07/18 0449  NA 134* 133* 130*  K 3.8 3.5 4.2  CL 101 102 94*  CO2 24 23 25   GLUCOSE 129* 104* 108*  BUN 20 12 21   CREATININE 0.68 0.56 0.63  CALCIUM 8.7* 8.6* 8.9   GFR: Estimated Creatinine Clearance: 36 mL/min (by C-G formula based on SCr of 0.63 mg/dL). Liver Function Tests: Recent Labs  Lab 05/04/18 1953  AST 25  ALT 18  ALKPHOS 70  BILITOT 0.8  PROT 8.0  ALBUMIN 4.2   No results for input(s): LIPASE, AMYLASE in the last 168 hours. No results for input(s): AMMONIA in the last 168 hours. Coagulation Profile: Recent Labs  Lab 05/04/18 1953 05/05/18 0530 05/06/18 0455 05/07/18 0449  INR 2.27 2.51 2.64 2.08   Cardiac Enzymes: No results for input(s): CKTOTAL, CKMB, CKMBINDEX, TROPONINI in the last 168 hours. BNP (last 3 results) No results for input(s): PROBNP in the  last 8760 hours. HbA1C: No results for input(s): HGBA1C in the last 72 hours. CBG: Recent Labs  Lab 05/04/18 1949  GLUCAP 130*   Lipid Profile: No results for input(s): CHOL, HDL, LDLCALC, TRIG, CHOLHDL, LDLDIRECT in the last 72 hours. Thyroid Function Tests: No results for input(s): TSH, T4TOTAL, FREET4, T3FREE, THYROIDAB in the last 72 hours. Anemia Panel: No results for input(s): VITAMINB12, FOLATE, FERRITIN, TIBC, IRON, RETICCTPCT in the last 72 hours. Sepsis Labs: No results for input(s): PROCALCITON, LATICACIDVEN in the last 168 hours.  Recent Results (from the past 240 hour(s))  Urine culture     Status: Abnormal (Preliminary result)   Collection Time: 05/04/18  9:04 PM  Result Value Ref Range Status   Specimen Description   Final    URINE, CLEAN CATCH Performed at [facility], [place] [address], [place], [place] [address]    Special Requests   Final    Normal Performed at [facility], [place] [address] [place], [place] [address]    Culture (  A)  Final    50,000 COLONIES/mL ENTEROCOCCUS FAECALIS SUSCEPTIBILITIES TO FOLLOW Performed at [facility], [place] [address], [place], [place] [address]    Report Status PENDING  Incomplete         Radiology Studies: No results found.      Scheduled Meds: . busPIRone  15 mg Oral BID  . diltiazem  120 mg Oral BID  . fosfomycin  3 g Oral Once  . lisinopril  40 mg Oral Daily  . pantoprazole  20 mg Oral Daily  . polyethylene glycol  17 g Oral Daily  . potassium chloride  40 mEq Oral Daily  . senna-docusate  1 tablet Oral BID  . torsemide  20 mg Oral Daily  . [START ON 05/08/2018] warfarin  2.5 mg Oral Once per day on Tue Sat   And  . warfarin  5 mg Oral Once per day on Sun Mon Wed Thu Fri  . Warfarin - Pharmacist Dosing Inpatient   Does not apply q1800   Continuous Infusions:    LOS: 0 days    Time spent: 35 minutes    Irine Seal, MD [facility]  If  7PM-7AM, please contact night-coverage [email or web address] 05/07/2018, 11:53 AM

## 2018-05-07 NOTE — Progress Notes (Signed)
ANTICOAGULATION CONSULT NOTE - Follow Up Consult  Pharmacy Consult for Warfarin Indication: atrial fibrillation  Allergies  Allergen Reactions  . Amlodipine Swelling and Other (See Comments)     Unspecified swelling reaction  . Amoxicillin Diarrhea, Nausea And Vomiting and Other (See Comments)    Has patient had a PCN reaction causing immediate rash, facial/tongue/throat swelling, SOB or lightheadedness with hypotension: No Has patient had a PCN reaction causing severe rash involving mucus membranes or skin necrosis: No Has patient had a PCN reaction that required hospitalization: No Has patient had a PCN reaction occurring within the last 10 years: No If all of the above answers are "NO", then may proceed with Cephalosporin use.  . Carafate [Sucralfate] Swelling and Other (See Comments)    Reaction:  Knee swelling/redness   . Cephalexin Diarrhea  . Codeine Nausea And Vomiting  . Irbesartan Swelling and Other (See Comments)    Reaction:  Facial/hand swelling and numbness   . Morphine And Related Other (See Comments)    Pt states that she has a history of addiction with this medication.      . Neurontin [Gabapentin] Swelling and Other (See Comments)    Reaction:  Leg swelling   . Nitrofurantoin Monohyd Macro Diarrhea and Nausea And Vomiting  . Prednisone Other (See Comments)    Reaction:  Elevated BP  . Sulfa Antibiotics Rash    Patient Measurements: Height: 5\' 1"  (154.9 cm) Weight: 125 lb (56.7 kg) IBW/kg (Calculated) : 47.8  Vital Signs: Temp: 98.6 F (37 C) (01/27 0541) Temp Source: Oral (01/27 0541) BP: 149/82 (01/27 0541) Pulse Rate: 90 (01/27 0541)  Labs: Recent Labs    05/04/18 1953 05/05/18 0530 05/06/18 0455 05/07/18 0449  HGB 13.0  --  13.0 13.6  HCT 39.9  --  39.2 41.5  PLT 213  --  252 266  LABPROT 24.7* 26.7* 27.8* 23.1*  INR 2.27 2.51 2.64 2.08  CREATININE 0.68  --  0.56 0.63    Estimated Creatinine Clearance: 36 mL/min (by C-G formula based on  SCr of 0.63 mg/dL).   Assessment: 34 yoF admitted with altered mental status, HA and HTN on home warfarin for a-fib, tissue aortic valve replacement, TIA.  Pharmacy is consulted to continue warfarin dosing inpatient.  PTA warfarin dose: 2.5 mg Tu/Sat and 5 mg other days. LD 1/24. INR 2.27 on admission.   Today, 05/07/2018: INR 2.08 CBC - Hgb and Plt WNL and stable. No bleeding or complications reported No major drug-drug interactions noted   Goal of Therapy:  INR 2-3 Monitor platelets by anticoagulation protocol: Yes   Plan:  Resume home dose 2.5 mg Tu/Sat, 5 mg AOD Daily PT/INR. Monitor for signs and symptoms of bleeding.  Eudelia Bunch, Pharm.D [phone] 05/07/2018 8:04 AM

## 2018-05-08 ENCOUNTER — Inpatient Hospital Stay (HOSPITAL_COMMUNITY): Payer: Medicare Other

## 2018-05-08 LAB — BASIC METABOLIC PANEL
Anion gap: 11 (ref 5–15)
Anion gap: 13 (ref 5–15)
BUN: 20 mg/dL (ref 8–23)
BUN: 21 mg/dL (ref 8–23)
CHLORIDE: 93 mmol/L — AB (ref 98–111)
CO2: 25 mmol/L (ref 22–32)
CO2: 23 mmol/L (ref 22–32)
Calcium: 8.6 mg/dL — ABNORMAL LOW (ref 8.9–10.3)
Calcium: 8.4 mg/dL — ABNORMAL LOW (ref 8.9–10.3)
Chloride: 92 mmol/L — ABNORMAL LOW (ref 98–111)
Creatinine, Ser: 0.69 mg/dL (ref 0.44–1.00)
Creatinine, Ser: 0.75 mg/dL (ref 0.44–1.00)
GFR calc Af Amer: >60 mL/min (ref >60)
GFR calc Af Amer: >60 mL/min (ref >60)
GFR calc non Af Amer: >60 mL/min (ref >60)
GFR calc non Af Amer: >60 mL/min (ref >60)
Glucose, Bld: 92 mg/dL (ref 70–99)
Glucose, Bld: 89 mg/dL (ref 70–99)
POTASSIUM: 3.8 mmol/L (ref 3.5–5.1)
Potassium: 3.8 mmol/L (ref 3.5–5.1)
Sodium: 128 mmol/L — ABNORMAL LOW (ref 135–145)
Sodium: 129 mmol/L — ABNORMAL LOW (ref 135–145)

## 2018-05-08 LAB — CBC
HCT: 42.5 % (ref 36.0–46.0)
Hemoglobin: 14.2 g/dL (ref 12.0–15.0)
MCH: 30 pg (ref 26.0–34.0)
MCHC: 33.4 g/dL (ref 30.0–36.0)
MCV: 89.7 fL (ref 80.0–100.0)
Platelets: 239 10*3/uL (ref 150–400)
RBC: 4.74 MIL/uL (ref 3.87–5.11)
RDW: 14 % (ref 11.5–15.5)
WBC: 10 10*3/uL (ref 4.0–10.5)
nRBC: 0 % (ref 0.0–0.2)

## 2018-05-08 LAB — PROTIME-INR
INR: 1.83
Prothrombin Time: 20.9 seconds — ABNORMAL HIGH (ref 11.4–15.2)

## 2018-05-08 LAB — MAGNESIUM: Magnesium: 2 mg/dL (ref 1.7–2.4)

## 2018-05-08 MED ORDER — HYDRALAZINE HCL 25 MG PO TABS
25.0000 mg | ORAL_TABLET | Freq: Three times a day (TID) | ORAL | Status: DC
  Administered 2018-05-08 – 2018-05-10 (×7): 25 mg via ORAL
  Filled 2018-05-08 (×7): qty 1

## 2018-05-08 MED ORDER — LOPERAMIDE HCL 2 MG PO CAPS: 2.0000 mg | ORAL_CAPSULE | ORAL | Status: DC | PRN

## 2018-05-08 MED ORDER — WARFARIN SODIUM 5 MG PO TABS
5.0000 mg | ORAL_TABLET | Freq: Once | ORAL | Status: AC
  Administered 2018-05-08: 5 mg via ORAL
  Filled 2018-05-08: qty 1

## 2018-05-08 MED ORDER — LOPERAMIDE HCL 2 MG PO CAPS
2.0000 mg | ORAL_CAPSULE | Freq: Once | ORAL | Status: AC
  Administered 2018-05-08: 2 mg via ORAL
  Filled 2018-05-08: qty 1

## 2018-05-08 MED ORDER — SODIUM CHLORIDE 0.9 % IV SOLN
INTRAVENOUS | Status: AC
  Administered 2018-05-09: 04:00:00 via INTRAVENOUS

## 2018-05-08 MED ORDER — DIAZEPAM 2 MG PO TABS
2.0000 mg | ORAL_TABLET | Freq: Every evening | ORAL | Status: DC | PRN
  Administered 2018-05-08: 2 mg via ORAL
  Filled 2018-05-08: qty 1

## 2018-05-08 MED ORDER — BENZONATATE 100 MG PO CAPS
100.0000 mg | ORAL_CAPSULE | Freq: Three times a day (TID) | ORAL | Status: DC | PRN
  Administered 2018-05-09 – 2018-05-10 (×2): 100 mg via ORAL
  Filled 2018-05-08 (×2): qty 1

## 2018-05-08 NOTE — Care Management (Signed)
Per [facility] rep pt is active with them for home health PT/RN. Will need resumption orders at DC. Marney Doctor RN,BSN [phone]

## 2018-05-08 NOTE — Progress Notes (Signed)
PROGRESS NOTE    Brittany [name]  XFG:[id number] DOB: Aug 12, 1927 DOA: 05/04/2018 PCP: Mayra Neer, MD   Brief Narrative:  Patient is a 85 year old female history of AAA status post aortic valve replacement, HTN, CVA, A. fib on Coumadin, chronic hyponatremia presented with worsening confusion.  Patient's family feels patient may have a urinary tract infection.  Head CT which was done with no acute abnormalities however consistent with extensive small vessel ischemic disease.  Urinalysis which was done was concerning for possible UTI.  Patient given IV Rocephin in the ED.  Patient admitted for further evaluation.   Assessment & Plan:   Active Problems:   Aortic stenosis   Hypertension   Chronic atrial fibrillation (HCC)   Hyponatremia   H/O aortic valve replacement   Confusion   Dementia (HCC)   Chronic ulcer of left foot (HCC)   Acute metabolic encephalopathy   Acute lower UTI  1 acute encephalopathy/confusion Questionable etiology.  May be likely secondary to probable UTI.  Urinalysis done with moderate leukocytes, nitrite negative, 6-10 WBCs.  Patient afebrile.  Urine cultures pending with preliminary results of 50,000 colonies of Enterococcus faecalis.  Sensitivities pending.  WBC within normal limits.  Patient improving clinically per daughter.  Patient started on IV Rocephin which was subsequently discontinued.  Patient placed on fosfomycin.  Will resume Valium at 2 mg p.o. nightly as needed.  Patient now volume depleted and lethargic which is slowly improving with hydration.  Continue IV fluids.  Supportive care.  Follow.  2.  Chronic atrial fibrillation Currently rate controlled on Cardizem.  INR at 1.83.  Coumadin per pharmacy.   3.  Hypertension Systolic blood pressure improving. Continue current regimen of Cardizem and lisinopril.  Per last cardiologist note patient is on Demadex 20 mg daily which was resumed last night.  Patient however somewhat lethargic yesterday and  today.  Demadex has been discontinued.  Patient on IV fluids due to lethargy.  Follow.   4.  Constipation Patient now with multiple watery loose stools overnight and this morning.  Patient now dehydrated and weak with some lethargy.  Discontinue Senokot and MiraLAX.  Imodium for loose stools.  Follow.  5.  Hyponatremia Likely secondary to volume depletion.  Patient initially placed back on Demadex with significant urine output.  Patient noted overnight to have multiple watery loose stools.  Patient noted this morning to have multiple watery loose stools and is very lethargic.  Laxatives discontinued.  Change IV fluids to normal saline at 100 cc/h.  Follow.  6.  History of aortic valve replacement Outpatient follow-up with cardiology.  7.  Chronic ulcer of the left foot Continue current wound care.  Outpatient follow-up with the wound care center.  Will   8.  Enterococcus faecalis uncomplicated UTI Urine cultures pending.  Patient was on IV Rocephin however urine cultures growing Enterococcus faecalis with sensitivities pending.  Discussed with pharmacist and as such due to culture of Enterococcus faecalis IV Rocephin was discontinued and patient given a dose of oral fosfomycin.   9.  Lethargy/dehydration Patient with lethargic this morning.  Per nurse tech patient now with 7 loose stools this morning and noted to have multiple loose stools overnight.  Patient likely dehydrated.  Demadex has been discontinued.  Discontinue stool softener and laxative.  Imodium as needed.  IV fluids.  Follow.   DVT prophylaxis: Coumadin Code Status: DNR Family Communication: Updated patient and daughter and son at bedside. Disposition Plan: Likely home once clinically improved and mentation back  to baseline with improvement in lethargy hopefully in 24 to 48 hours.   Consultants:   None  Procedures:   CT head 05/04/2018  Chest x-ray pending 05/08/2018  Antimicrobials:   IV Rocephin 05/05/2018>>>>  05/07/2018  Fosfomycin 05/07/2018   Subjective: Patient coming out of the bathroom.  Patient feeling very weak.  Per nurse tech patient with at least 7 watery loose stools this morning.  Patient noted to be up all night with multiple loose stools.  Patient alert oriented to self place and time.    Objective: Vitals:   05/07/18 1034 05/07/18 1035 05/07/18 2115 05/08/18 0613  BP: 139/76 139/76 (!) 160/98 (!) 175/91  Pulse:   88 76  Resp:   15 17  Temp:   98.5 F (36.9 C) 98 F (36.7 C)  TempSrc:   Oral Oral  SpO2:   99% 96%  Weight:      Height:        Intake/Output Summary (Last 24 hours) at 05/08/2018 1214 Last data filed at 05/08/2018 0900 Gross per 24 hour  Intake 1533.04 ml  Output -  Net 1533.04 ml   Filed Weights   05/04/18 1934  Weight: 56.7 kg    Examination:  General exam: Lethargic. Respiratory system: Lungs clear to auscultation bilaterally.  No wheezes, no crackles, no rhonchi.  Normal respiratory effort.  Cardiovascular system: Regular rate rhythm no murmurs rubs or gallops.  No JVD.  No lower extremity edema. Gastrointestinal system: Abdomen is soft, nontender, nondistended, positive bowel sounds.  No rebound.  No guarding.  Central nervous system: Alert and oriented. No focal neurological deficits. Extremities: Symmetric 5 x 5 power. Skin: No rashes, lesions or ulcers Psychiatry: Judgement and insight appear fair. Mood & affect appropriate.     Data Reviewed: I have personally reviewed following labs and imaging studies  CBC: Recent Labs  Lab 05/04/18 1953 05/06/18 0455 05/07/18 0449 05/08/18 0511  WBC 9.0 10.6* 12.1* 10.0  NEUTROABS  --  7.2  --   --   HGB 13.0 13.0 13.6 14.2  HCT 39.9 39.2 41.5 42.5  MCV 92.6 88.3 89.8 89.7  PLT 213 252 266 299   Basic Metabolic Panel: Recent Labs  Lab 05/04/18 1953 05/06/18 0455 05/07/18 0449 05/08/18 0511  NA 134* 133* 130* 128*  K 3.8 3.5 4.2 3.8  CL 101 102 94* 92*  CO2 24 23 25 25   GLUCOSE  129* 104* 108* 92  BUN 20 12 21 20   CREATININE 0.68 0.56 0.63 0.69  CALCIUM 8.7* 8.6* 8.9 8.6*   GFR: Estimated Creatinine Clearance: 36 mL/min (by C-G formula based on SCr of 0.69 mg/dL). Liver Function Tests: Recent Labs  Lab 05/04/18 1953  AST 25  ALT 18  ALKPHOS 70  BILITOT 0.8  PROT 8.0  ALBUMIN 4.2   No results for input(s): LIPASE, AMYLASE in the last 168 hours. No results for input(s): AMMONIA in the last 168 hours. Coagulation Profile: Recent Labs  Lab 05/04/18 1953 05/05/18 0530 05/06/18 0455 05/07/18 0449 05/08/18 0511  INR 2.27 2.51 2.64 2.08 1.83   Cardiac Enzymes: No results for input(s): CKTOTAL, CKMB, CKMBINDEX, TROPONINI in the last 168 hours. BNP (last 3 results) No results for input(s): PROBNP in the last 8760 hours. HbA1C: No results for input(s): HGBA1C in the last 72 hours. CBG: Recent Labs  Lab 05/04/18 1949  GLUCAP 130*   Lipid Profile: No results for input(s): CHOL, HDL, LDLCALC, TRIG, CHOLHDL, LDLDIRECT in the last 72 hours. Thyroid Function  Tests: No results for input(s): TSH, T4TOTAL, FREET4, T3FREE, THYROIDAB in the last 72 hours. Anemia Panel: No results for input(s): VITAMINB12, FOLATE, FERRITIN, TIBC, IRON, RETICCTPCT in the last 72 hours. Sepsis Labs: No results for input(s): PROCALCITON, LATICACIDVEN in the last 168 hours.  Recent Results (from the past 240 hour(s))  Urine culture     Status: Abnormal (Preliminary result)   Collection Time: 05/04/18  9:04 PM  Result Value Ref Range Status   Specimen Description   Final    URINE, CLEAN CATCH Performed at [facility], [place] [address] [place], [place] [address]    Special Requests   Final    Normal Performed at [facility], [place] [address] [place], [place] [address]    Culture (A)  Final    50,000 COLONIES/mL ENTEROCOCCUS FAECALIS SUSCEPTIBILITIES TO FOLLOW Performed at [facility], [place] [address], [place], [place]  [address]    Report Status PENDING  Incomplete         Radiology Studies: No results found.      Scheduled Meds: . busPIRone  15 mg Oral BID  . diltiazem  120 mg Oral BID  . hydrALAZINE  25 mg Oral Q8H  . lisinopril  40 mg Oral Daily  . pantoprazole  20 mg Oral Daily  . polyethylene glycol  17 g Oral Daily  . potassium chloride  40 mEq Oral Daily  . senna-docusate  1 tablet Oral BID  . warfarin  5 mg Oral ONCE-1800  . Warfarin - Pharmacist Dosing Inpatient   Does not apply q1800   Continuous Infusions: . sodium chloride       LOS: 1 day    Time spent: 35 minutes    Irine Seal, MD [facility]  If 7PM-7AM, please contact night-coverage [email or web address] 05/08/2018, 12:14 PM

## 2018-05-08 NOTE — Progress Notes (Signed)
Patient had a very restless night - I had her Sunday and I feel as if she was more confused last tonight than she was Sunday night when I had her. Not sure if she is quite ready to D/C today given the difference from Sunday to Monday night.

## 2018-05-08 NOTE — Progress Notes (Addendum)
ANTICOAGULATION CONSULT NOTE - Follow Up Consult  Pharmacy Consult for Warfarin Indication: atrial fibrillation  Allergies  Allergen Reactions  . Amlodipine Swelling and Other (See Comments)     Unspecified swelling reaction  . Amoxicillin Diarrhea, Nausea And Vomiting and Other (See Comments)    Has patient had a PCN reaction causing immediate rash, facial/tongue/throat swelling, SOB or lightheadedness with hypotension: No Has patient had a PCN reaction causing severe rash involving mucus membranes or skin necrosis: No Has patient had a PCN reaction that required hospitalization: No Has patient had a PCN reaction occurring within the last 10 years: No If all of the above answers are "NO", then may proceed with Cephalosporin use.  . Carafate [Sucralfate] Swelling and Other (See Comments)    Reaction:  Knee swelling/redness   . Cephalexin Diarrhea  . Codeine Nausea And Vomiting  . Irbesartan Swelling and Other (See Comments)    Reaction:  Facial/hand swelling and numbness   . Morphine And Related Other (See Comments)    Pt states that she has a history of addiction with this medication.      . Neurontin [Gabapentin] Swelling and Other (See Comments)    Reaction:  Leg swelling   . Nitrofurantoin Monohyd Macro Diarrhea and Nausea And Vomiting  . Prednisone Other (See Comments)    Reaction:  Elevated BP  . Sulfa Antibiotics Rash    Patient Measurements: Height: 5\' 1"  (154.9 cm) Weight: 125 lb (56.7 kg) IBW/kg (Calculated) : 47.8  Vital Signs: Temp: 98 F (36.7 C) (01/28 0613) Temp Source: Oral (01/28 9675) BP: 175/91 (01/28 9163) Pulse Rate: 76 (01/28 0613)  Labs: Recent Labs    05/06/18 0455 05/07/18 0449 05/08/18 0511  HGB 13.0 13.6 14.2  HCT 39.2 41.5 42.5  PLT 252 266 239  LABPROT 27.8* 23.1* 20.9*  INR 2.64 2.08 1.83  CREATININE 0.56 0.63 0.69    Estimated Creatinine Clearance: 36 mL/min (by C-G formula based on SCr of 0.69 mg/dL).   Assessment: 89 yoF  admitted with altered mental status, HA and HTN on home warfarin for a-fib, tissue aortic valve replacement, TIA.  Pharmacy is consulted to continue warfarin dosing inpatient.  PTA warfarin dose: 2.5 mg Tu/Sat and 5 mg other days. LD 1/24. INR 2.27 on admission.   Today, 05/08/2018: INR 1.83 - below goal of 2-3 after 2.5-2.5- 5 mg coumadin CBC - Hgb and Plt WNL and stable. No bleeding or complications reported No major drug-drug interactions noted   Goal of Therapy:  INR 2-3 Monitor platelets by anticoagulation protocol: Yes   Plan:  Coumadin 5 mg po x 1 dose today Daily PT/INR. Monitor for signs and symptoms of bleeding.  Eudelia Bunch, Pharm.D [phone] 05/08/2018 10:51 AM

## 2018-05-09 LAB — BASIC METABOLIC PANEL
Anion gap: 10 (ref 5–15)
BUN: 18 mg/dL (ref 8–23)
CO2: 21 mmol/L — ABNORMAL LOW (ref 22–32)
CREATININE: 0.72 mg/dL (ref 0.44–1.00)
Calcium: 8.9 mg/dL (ref 8.9–10.3)
Chloride: 95 mmol/L — ABNORMAL LOW (ref 98–111)
GFR calc Af Amer: >60 mL/min (ref >60)
GFR calc non Af Amer: >60 mL/min (ref >60)
Glucose, Bld: 93 mg/dL (ref 70–99)
Potassium: 6 mmol/L — ABNORMAL HIGH (ref 3.5–5.1)
Sodium: 126 mmol/L — ABNORMAL LOW (ref 135–145)

## 2018-05-09 LAB — CBC
HCT: 46.8 % — ABNORMAL HIGH (ref 36.0–46.0)
Hemoglobin: 15.1 g/dL — ABNORMAL HIGH (ref 12.0–15.0)
MCH: 29 pg (ref 26.0–34.0)
MCHC: 32.3 g/dL (ref 30.0–36.0)
MCV: 90 fL (ref 80.0–100.0)
Platelets: 255 10*3/uL (ref 150–400)
RBC: 5.2 MIL/uL — ABNORMAL HIGH (ref 3.87–5.11)
RDW: 14.2 % (ref 11.5–15.5)
WBC: 11.4 10*3/uL — ABNORMAL HIGH (ref 4.0–10.5)
nRBC: 0 % (ref 0.0–0.2)

## 2018-05-09 LAB — URINE CULTURE
Culture: 50000 — AB
Special Requests: NORMAL

## 2018-05-09 LAB — PROTIME-INR
INR: 1.49
Prothrombin Time: 17.8 seconds — ABNORMAL HIGH (ref 11.4–15.2)

## 2018-05-09 MED ORDER — SODIUM CHLORIDE 0.9 % IV SOLN
INTRAVENOUS | Status: DC
  Administered 2018-05-09 – 2018-05-10 (×3): via INTRAVENOUS

## 2018-05-09 MED ORDER — WARFARIN SODIUM 5 MG PO TABS
5.0000 mg | ORAL_TABLET | Freq: Once | ORAL | Status: AC
  Administered 2018-05-09: 5 mg via ORAL
  Filled 2018-05-09: qty 1

## 2018-05-09 NOTE — Progress Notes (Signed)
PROGRESS NOTE  Brittany [name] NID:[id number] DOB: 01-May-1927 DOA: 05/04/2018 PCP: Mayra Neer, MD   LOS: 2 days   Brief narrative: Patient is a 85 year old female history of AAA status post aortic valve replacement, HTN, CVA, A. fib on Coumadin, chronic hyponatremia presented with worsening confusion.  Patient's family feels patient may have a urinary tract infection.  Head CT which was done with no acute abnormalities however consistent with extensive small vessel ischemic disease.  Urinalysis was concerning for possible UTI.  Patient was given IV Rocephin in the ED.  Patient was admitted for further evaluation.  Assessment/Plan:  Active Problems:   Aortic stenosis   Hypertension   Chronic atrial fibrillation (HCC)   Hyponatremia   H/O aortic valve replacement   Confusion   Dementia (HCC)   Chronic ulcer of left foot (HCC)   Acute metabolic encephalopathy   Acute lower UTI  1 acute encephalopathy/confusion Likely secondary to UTI.  Improving mental status now. Will resume Valium at 2 mg p.o. nightly as needed.   2. Enterococcus UTI -pansensitive.  IV fosfomycin 1 dose was given on 1/27.  Patient is now volume depleted and lethargic which is slowly improving with hydration. Continue IV fluids.  Supportive care.   2.  Chronic atrial fibrillation Currently rate controlled on Cardizem.  INR at 1.49. Coumadin per pharmacy.   3.  Hypertension Systolic blood pressure improving. Continue current regimen of Cardizem and lisinopril.  Per last cardiologist note patient is on Demadex 20 mg daily.   Patient was hydrated because of lethargy.  Demadex remains on hold at this time.  4.  Diarrhea-patient initially had constipation and had received Senokot, MiraLAX and Imodium.  Patient then started having diarrhea.  Suspect an allergic response to IV ceftriaxone, given similar allergy with Keflex. Monitor bowel movements for next 24 hours.  Check C. difficile assay if patient continues to  have diarrhea.  5.  Hyponatremia - sodium level low at 126 today which is a downtrend.  Diuretics remains on hold.  Repeat BMP tomorrow.  6.  Hyperkalemia -potassium level was elevated to 6 today from 3.8 yesterday.  True versus false results.  In any case, potassium supplementation was held this morning.  Will repeat blood work in the morning.  6.  History of aortic valve replacement - Outpatient follow-up with cardiology.  7.  Chronic ulcer of the left foot - Continue current wound care.  Outpatient follow-up with the wound care center.    VTE Prophylaxis: Coumadin    Code Status: DNR   Disposition Plan: Likely home tomorrow if diarrhea improves.  Antibiotics: Antibiotics Given (last 72 hours)    Date/Time Action Medication Dose Rate   05/06/18 2217 New Bag/Given   cefTRIAXone (ROCEPHIN) 1 g in sodium chloride 0.9 % 100 mL IVPB 1 g 200 mL/hr   05/07/18 1303 Given   fosfomycin (MONUROL) packet 3 g 3 g       Continuous Infusions:  . sodium chloride      Scheduled Meds: . busPIRone  15 mg Oral BID  . diltiazem  120 mg Oral BID  . hydrALAZINE  25 mg Oral Q8H  . lisinopril  40 mg Oral Daily  . pantoprazole  20 mg Oral Daily  . warfarin  5 mg Oral ONCE-1800  . Warfarin - Pharmacist Dosing Inpatient   Does not apply q1800    PRN meds: acetaminophen **OR** acetaminophen, alum & mag hydroxide-simeth, benzonatate, diazepam, hydrALAZINE, HYDROcodone-acetaminophen, loperamide   Subjective: Patient was seen and  examined this morning.  Pleasant elderly Caucasian female.  No acute distress.  Caregiver at bedside  Objective: Vitals:   05/09/18 0943 05/09/18 1331  BP: 138/79 121/64  Pulse:  (!) 58  Resp:  18  Temp:  97.8 F (36.6 C)  SpO2:  98%    Intake/Output Summary (Last 24 hours) at 05/09/2018 1515 Last data filed at 05/09/2018 1227 Gross per 24 hour  Intake 1820 ml  Output 700 ml  Net 1120 ml   Filed Weights   05/04/18 1934  Weight: 56.7 kg   Body mass  index is 23.62 kg/m.   Physical Exam: GENERAL: Pleasant, elderly, Caucasian female HENT: No scleral pallor or icterus. Pupils equally reactive to light. Oral mucosa is moist NECK: is supple, no palpable thyroid enlargement. CHEST: Clear to auscultation. No crackles or wheezes. Non tender on palpation. Diminished breath sounds bilaterally. CVS: S1 and S2 heard, no murmur. Regular rate and rhythm. No pericardial rub. ABDOMEN: Soft, non-tender, bowel sounds are present. No palpable hepato-splenomegaly. EXTREMITIES: No edema. CNS: Alert, awake, oriented to place and person SKIN: warm and dry without rashes.  Data Review: I have personally reviewed the laboratory data and studies available.  Terrilee Croak, MD  [facility] 05/09/2018

## 2018-05-09 NOTE — Progress Notes (Signed)
ANTICOAGULATION CONSULT NOTE - Follow Up Consult  Pharmacy Consult for Warfarin Indication: atrial fibrillation  Allergies  Allergen Reactions  . Amlodipine Swelling and Other (See Comments)     Unspecified swelling reaction  . Amoxicillin Diarrhea, Nausea And Vomiting and Other (See Comments)    Has patient had a PCN reaction causing immediate rash, facial/tongue/throat swelling, SOB or lightheadedness with hypotension: No Has patient had a PCN reaction causing severe rash involving mucus membranes or skin necrosis: No Has patient had a PCN reaction that required hospitalization: No Has patient had a PCN reaction occurring within the last 10 years: No If all of the above answers are "NO", then may proceed with Cephalosporin use.  . Carafate [Sucralfate] Swelling and Other (See Comments)    Reaction:  Knee swelling/redness   . Cephalexin Diarrhea  . Codeine Nausea And Vomiting  . Irbesartan Swelling and Other (See Comments)    Reaction:  Facial/hand swelling and numbness   . Morphine And Related Other (See Comments)    Pt states that she has a history of addiction with this medication.      . Neurontin [Gabapentin] Swelling and Other (See Comments)    Reaction:  Leg swelling   . Nitrofurantoin Monohyd Macro Diarrhea and Nausea And Vomiting  . Prednisone Other (See Comments)    Reaction:  Elevated BP  . Sulfa Antibiotics Rash    Patient Measurements: Height: 5\' 1"  (154.9 cm) Weight: 125 lb (56.7 kg) IBW/kg (Calculated) : 47.8  Vital Signs: Temp: 98.9 F (37.2 C) (01/29 0600) Temp Source: Oral (01/29 0600) BP: 138/79 (01/29 0943) Pulse Rate: 92 (01/29 0831)  Labs: Recent Labs    05/07/18 0449 05/08/18 0511 05/08/18 1300 05/09/18 0347  HGB 13.6 14.2  --  15.1*  HCT 41.5 42.5  --  46.8*  PLT 266 239  --  255  LABPROT 23.1* 20.9*  --  17.8*  INR 2.08 1.83  --  1.49  CREATININE 0.63 0.69 0.75 0.72    Estimated Creatinine Clearance: 36 mL/min (by C-G formula based  on SCr of 0.72 mg/dL).   Assessment: 42 yoF admitted with altered mental status, HA and HTN on home warfarin for a-fib, tissue aortic valve replacement, TIA.  Pharmacy is consulted to continue warfarin dosing inpatient.  PTA warfarin dose: 2.5 mg Tu/Sat and 5 mg other days. LD 1/24. INR 2.27 on admission.   Today, 05/09/2018: INR 1.49 drifting down- below goal of 2-3 after 2.5>2.5> 5>5 mg coumadin CBC - Hgb and Plt WNL and stable. No bleeding or complications reported No major drug-drug interactions noted   Goal of Therapy:  INR 2-3 Monitor platelets by anticoagulation protocol: Yes   Plan:  Coumadin 5 mg po x 1 dose today Daily PT/INR. Monitor for signs and symptoms of bleeding.  Eudelia Bunch, Pharm.D [phone] 05/09/2018 10:49 AM

## 2018-05-09 NOTE — Progress Notes (Signed)
Report handed off  to [facility] who will resume care.

## 2018-05-10 LAB — CBC WITH DIFFERENTIAL/PLATELET
Abs Immature Granulocytes: 0.05 10*3/uL (ref 0.00–0.07)
Basophils Absolute: 0.1 10*3/uL (ref 0.0–0.1)
Basophils Relative: 1 %
Eosinophils Absolute: 0.4 10*3/uL (ref 0.0–0.5)
Eosinophils Relative: 4 %
HCT: 37.2 % (ref 36.0–46.0)
Hemoglobin: 12.4 g/dL (ref 12.0–15.0)
Immature Granulocytes: 1 %
Lymphocytes Relative: 29 %
Lymphs Abs: 2.8 10*3/uL (ref 0.7–4.0)
MCH: 29.7 pg (ref 26.0–34.0)
MCHC: 33.3 g/dL (ref 30.0–36.0)
MCV: 89.2 fL (ref 80.0–100.0)
MONOS PCT: 13 %
Monocytes Absolute: 1.2 10*3/uL — ABNORMAL HIGH (ref 0.1–1.0)
NEUTROS PCT: 52 %
NRBC: 0 % (ref 0.0–0.2)
Neutro Abs: 5 10*3/uL (ref 1.7–7.7)
Platelets: 209 10*3/uL (ref 150–400)
RBC: 4.17 MIL/uL (ref 3.87–5.11)
RDW: 14.2 % (ref 11.5–15.5)
WBC: 9.4 10*3/uL (ref 4.0–10.5)

## 2018-05-10 LAB — BASIC METABOLIC PANEL
Anion gap: 8 (ref 5–15)
BUN: 15 mg/dL (ref 8–23)
CALCIUM: 8.1 mg/dL — AB (ref 8.9–10.3)
CO2: 19 mmol/L — AB (ref 22–32)
Chloride: 99 mmol/L (ref 98–111)
Creatinine, Ser: 0.55 mg/dL (ref 0.44–1.00)
GFR calc Af Amer: >60 mL/min (ref >60)
GFR calc non Af Amer: >60 mL/min (ref >60)
Glucose, Bld: 93 mg/dL (ref 70–99)
Potassium: 4 mmol/L (ref 3.5–5.1)
Sodium: 126 mmol/L — ABNORMAL LOW (ref 135–145)

## 2018-05-10 LAB — PROTIME-INR
INR: 1.48
Prothrombin Time: 17.8 seconds — ABNORMAL HIGH (ref 11.4–15.2)

## 2018-05-10 MED ORDER — WARFARIN SODIUM 6 MG PO TABS
6.0000 mg | ORAL_TABLET | Freq: Once | ORAL | Status: DC
  Filled 2018-05-10: qty 1

## 2018-05-10 NOTE — Progress Notes (Signed)
ANTICOAGULATION CONSULT NOTE - Follow Up Consult  Pharmacy Consult for Warfarin Indication: atrial fibrillation  Allergies  Allergen Reactions  . Amlodipine Swelling and Other (See Comments)     Unspecified swelling reaction  . Amoxicillin Diarrhea, Nausea And Vomiting and Other (See Comments)    Has patient had a PCN reaction causing immediate rash, facial/tongue/throat swelling, SOB or lightheadedness with hypotension: No Has patient had a PCN reaction causing severe rash involving mucus membranes or skin necrosis: No Has patient had a PCN reaction that required hospitalization: No Has patient had a PCN reaction occurring within the last 10 years: No If all of the above answers are "NO", then may proceed with Cephalosporin use.  . Carafate [Sucralfate] Swelling and Other (See Comments)    Reaction:  Knee swelling/redness   . Cephalexin Diarrhea  . Codeine Nausea And Vomiting  . Irbesartan Swelling and Other (See Comments)    Reaction:  Facial/hand swelling and numbness   . Morphine And Related Other (See Comments)    Pt states that she has a history of addiction with this medication.      . Neurontin [Gabapentin] Swelling and Other (See Comments)    Reaction:  Leg swelling   . Nitrofurantoin Monohyd Macro Diarrhea and Nausea And Vomiting  . Prednisone Other (See Comments)    Reaction:  Elevated BP  . Sulfa Antibiotics Rash    Patient Measurements: Height: 5\' 1"  (154.9 cm) Weight: 125 lb (56.7 kg) IBW/kg (Calculated) : 47.8  Vital Signs: Temp: 99 F (37.2 C) (01/30 0504) Temp Source: Oral (01/30 0504) BP: 138/65 (01/30 0504) Pulse Rate: 82 (01/30 0504)  Labs: Recent Labs    05/08/18 0511 05/08/18 1300 05/09/18 0347 05/10/18 0345  HGB 14.2  --  15.1* 12.4  HCT 42.5  --  46.8* 37.2  PLT 239  --  255 209  LABPROT 20.9*  --  17.8* 17.8*  INR 1.83  --  1.49 1.48  CREATININE 0.69 0.75 0.72 0.55    Estimated Creatinine Clearance: 36 mL/min (by C-G formula based on  SCr of 0.55 mg/dL).   Assessment: 74 yoF admitted with altered mental status, HA and HTN on home warfarin for a-fib, tissue aortic valve replacement, TIA.  Pharmacy is consulted to continue warfarin dosing inpatient.  PTA warfarin dose: 2.5 mg Tu/Sat and 5 mg other days. LD 1/24. INR 2.27 on admission.   Today, 05/10/2018: INR 1.48 below goal of 2-3 after 2.5>2.5> 5>5>5 mg coumadin CBC - Hgb and Plt WNL No bleeding or complications reported No major drug-drug interactions noted   Goal of Therapy:  INR 2-3 Monitor platelets by anticoagulation protocol: Yes   Plan:  Coumadin 6 mg po x 1 dose today Daily PT/INR. Monitor for signs and symptoms of bleeding.  Eudelia Bunch, Pharm.D [phone] 05/10/2018 8:16 AM

## 2018-05-10 NOTE — Discharge Summary (Signed)
Physician Discharge Summary  Patient ID: Brittany [name] MRN: [id number] DOB/AGE: 1927/07/13 85 y.o.  Admit date: 05/04/2018 Discharge date: 05/10/2018  Admission Diagnoses:  Discharge Diagnoses:  Active Problems:   Aortic stenosis   Hypertension   Chronic atrial fibrillation (HCC)   Hyponatremia   H/O aortic valve replacement   Confusion   Dementia (HCC)   Chronic ulcer of left foot (HCC)   Acute metabolic encephalopathy   Acute lower UTI   Discharged Condition: fair  Hospital Course: Patient is 85 year old female with history of AAA status post aortic valve replacement, hypertension, CVA, chronic A. fib on Coumadin, chronic hyponatremia who presented to the emergency room with worsening confusion.  She was brought to the hospital by her daughter with concern about UTI making her confused and loss of balance.  Initial evaluation in the ER showed normal head CT with chronic vascular changes.  Urinalysis concerned about UTI.  She was admitted and treated in the hospital for following conditions.  Acute encephalopathy/confusion: Likely secondary to UTI.  Mental status has improved and normalized to her baseline.  She lives at independent living facility with health aide.  Patient was evaluated by physical therapy and she is willing to return back to independent living.  Enterococcus UTI: Pansensitive enterococcus.  She received different antibiotics including IV Rocephin then IV fosfomycin.  She has already received 7 days of therapy in the hospital, does not need to continue further antibiotics.  Chronic atrial fibrillation: Rate controlled on Cardizem.  INR is subtherapeutic, she will continue 5 mg of Coumadin tonight.  She will have PT/INR checked by home health nursing.  Hyponatremia: Patient does have chronic hyponatremia.  This is probably multifactorial.  Her sodium is 126 today, however her sodium has been 126 and 127 for last 4 years.  This is mostly chronic.  She is on as  needed torsemide.  We discussed about decreasing free water intake.  Since this is mostly chronic, will not try to increase aggressively.  Suggested drinking electrolyte water not free water.  Other chronic medical problems including hypertension remains a stable.  She will resume home medications.   Consults: None  Significant Diagnostic Studies: microbiology: urine culture: positive for 50,000 enterococcus.  Treatments: antibiotics: ceftriaxone and Fosfomycin.  Discharge Exam: Blood pressure 138/65, pulse 82, temperature 99 F (37.2 C), temperature source Oral, resp. rate 16, height 5\' 1"  (1.549 m), weight 56.7 kg, SpO2 98 %. General appearance: alert, appears stated age and no distress Head: Normocephalic, without obvious abnormality, atraumatic Eyes: conjunctivae/corneas clear. PERRL, EOM's intact. Fundi benign. Ears: normal TM's and external ear canals both ears Nose: Nares normal. Septum midline. Mucosa normal. No drainage or sinus tenderness. Throat: lips, mucosa, and tongue normal; teeth and gums normal Resp: clear to auscultation bilaterally Chest wall: no tenderness Cardio: regular rate and rhythm, S1, S2 normal, no murmur, click, rub or gallop GI: soft, non-tender; bowel sounds normal; no masses,  no organomegaly Extremities: extremities normal, atraumatic, no cyanosis or edema Pulses: 2+ and symmetric Neurologic: Grossly normal  Disposition: Discharge disposition: 01-Home or Self Care       Discharge Instructions    Diet - low sodium heart healthy   Complete by:  As directed    Increase activity slowly   Complete by:  As directed      Allergies as of 05/10/2018      Reactions   Amlodipine Swelling, Other (See Comments)    Unspecified swelling reaction   Amoxicillin Diarrhea, Nausea And Vomiting, Other (  See Comments)   Has patient had a PCN reaction causing immediate rash, facial/tongue/throat swelling, SOB or lightheadedness with hypotension: No Has patient  had a PCN reaction causing severe rash involving mucus membranes or skin necrosis: No Has patient had a PCN reaction that required hospitalization: No Has patient had a PCN reaction occurring within the last 10 years: No If all of the above answers are "NO", then may proceed with Cephalosporin use.   Carafate [sucralfate] Swelling, Other (See Comments)   Reaction:  Knee swelling/redness    Cephalexin Diarrhea   Codeine Nausea And Vomiting   Irbesartan Swelling, Other (See Comments)   Reaction:  Facial/hand swelling and numbness    Morphine And Related Other (See Comments)   Pt states that she has a history of addiction with this medication.       Neurontin [gabapentin] Swelling, Other (See Comments)   Reaction:  Leg swelling    Nitrofurantoin Monohyd Macro Diarrhea, Nausea And Vomiting   Prednisone Other (See Comments)   Reaction:  Elevated BP   Sulfa Antibiotics Rash      Medication List    STOP taking these medications   furosemide 40 MG tablet Commonly known as:  LASIX   polyethylene glycol packet Commonly known as:  MIRALAX / GLYCOLAX     TAKE these medications   atorvastatin 20 MG tablet Commonly known as:  LIPITOR Take 20 mg by mouth daily.   augmented betamethasone dipropionate 0.05 % cream Commonly known as:  DIPROLENE-AF Apply 1 application topically daily.   busPIRone 15 MG tablet Commonly known as:  BUSPAR Take 1 tablet (15 mg total) by mouth 2 (two) times daily.   cycloSPORINE 0.05 % ophthalmic emulsion Commonly known as:  RESTASIS Place 1 drop into both eyes 2 (two) times daily.   diazepam 5 MG tablet Commonly known as:  VALIUM Take 5 mg by mouth at bedtime.   diltiazem 120 MG tablet Commonly known as:  CARDIZEM Take 120 mg by mouth 2 (two) times daily.   famotidine 40 MG tablet Commonly known as:  PEPCID Take 40 mg by mouth daily.   HYDROcodone-acetaminophen 5-325 MG tablet Commonly known as:  NORCO/VICODIN Take 1 tablet by mouth every 6  (six) hours as needed for severe pain.   ICAPS AREDS 2 Caps Take 1 capsule by mouth 2 (two) times daily.   lisinopril 40 MG tablet Commonly known as:  PRINIVIL,ZESTRIL Take 40 mg by mouth daily.   nitroGLYCERIN 0.4 MG SL tablet Commonly known as:  NITROSTAT Place 1 tablet (0.4 mg total) under the tongue every 5 (five) minutes as needed for chest pain.   ondansetron 8 MG tablet Commonly known as:  ZOFRAN Take 1 tablet (8 mg total) by mouth 2 (two) times daily. What changed:    when to take this  reasons to take this   pantoprazole 20 MG tablet Commonly known as:  PROTONIX Take 1 tablet (20 mg total) by mouth daily.   potassium chloride SA 20 MEQ tablet Commonly known as:  K-DUR,KLOR-CON Take 20 mEq by mouth daily as needed (ONLY WHEN TAKING TORSEMIDE).   REFRESH OP Place 1 drop into both eyes 2 (two) times daily.   torsemide 20 MG tablet Commonly known as:  DEMADEX Take 20 mg by mouth daily as needed (retaining fluid).   warfarin 5 MG tablet Commonly known as:  COUMADIN Take as directed. If you are unsure how to take this medication, talk to your nurse or doctor. Original instructions:  Take  as directed by [facility] What changed:    how much to take  how to take this  additional instructions       Total time spent on discharge 32 minutes.  SignedBarb Merino 05/10/2018, 12:14 PM

## 2018-05-10 NOTE — Care Management Important Message (Signed)
Important Message  Patient Details  Name: LILLIANNA SABEL MRN: [id number] Date of Birth: 1927-10-09   Medicare Important Message Given:  Yes    Kerin Salen 05/10/2018, 9:12 AMImportant Message  Patient Details  Name: GAYL IVANOFF MRN: [id number] Date of Birth: [birth year]   Medicare Important Message Given:  Yes    Kerin Salen 05/10/2018, 9:12 AM

## 2018-05-10 NOTE — Care Management (Signed)
[facility] rep alerted of order for 3in1 Marney Doctor RN,BSN [phone]

## 2018-05-10 NOTE — Progress Notes (Signed)
Patient discharged to home with caregiver to [place]. Discharge instructions reviewed with caregiver Maudie Mercury who verbalized understanding. No new RX.

## 2018-05-14 DIAGNOSIS — D631 Anemia in chronic kidney disease: Secondary | ICD-10-CM | POA: Diagnosis not present

## 2018-05-14 DIAGNOSIS — I2510 Atherosclerotic heart disease of native coronary artery without angina pectoris: Secondary | ICD-10-CM | POA: Diagnosis not present

## 2018-05-14 DIAGNOSIS — L97522 Non-pressure chronic ulcer of other part of left foot with fat layer exposed: Secondary | ICD-10-CM | POA: Diagnosis not present

## 2018-05-14 DIAGNOSIS — I5030 Unspecified diastolic (congestive) heart failure: Secondary | ICD-10-CM | POA: Diagnosis not present

## 2018-05-14 DIAGNOSIS — N182 Chronic kidney disease, stage 2 (mild): Secondary | ICD-10-CM | POA: Diagnosis not present

## 2018-05-14 DIAGNOSIS — I130 Hypertensive heart and chronic kidney disease with heart failure and stage 1 through stage 4 chronic kidney disease, or unspecified chronic kidney disease: Secondary | ICD-10-CM | POA: Diagnosis not present

## 2018-05-17 ENCOUNTER — Encounter (HOSPITAL_BASED_OUTPATIENT_CLINIC_OR_DEPARTMENT_OTHER): Payer: Medicare Other | Attending: Internal Medicine

## 2018-05-17 DIAGNOSIS — I252 Old myocardial infarction: Secondary | ICD-10-CM | POA: Insufficient documentation

## 2018-05-17 DIAGNOSIS — S99922A Unspecified injury of left foot, initial encounter: Secondary | ICD-10-CM | POA: Insufficient documentation

## 2018-05-17 DIAGNOSIS — L03115 Cellulitis of right lower limb: Secondary | ICD-10-CM | POA: Insufficient documentation

## 2018-05-17 DIAGNOSIS — I2510 Atherosclerotic heart disease of native coronary artery without angina pectoris: Secondary | ICD-10-CM | POA: Insufficient documentation

## 2018-05-17 DIAGNOSIS — Y838 Other surgical procedures as the cause of abnormal reaction of the patient, or of later complication, without mention of misadventure at the time of the procedure: Secondary | ICD-10-CM | POA: Insufficient documentation

## 2018-05-17 DIAGNOSIS — S40022A Contusion of left upper arm, initial encounter: Secondary | ICD-10-CM | POA: Insufficient documentation

## 2018-05-17 DIAGNOSIS — K7460 Unspecified cirrhosis of liver: Secondary | ICD-10-CM | POA: Insufficient documentation

## 2018-05-17 DIAGNOSIS — F0390 Unspecified dementia without behavioral disturbance: Secondary | ICD-10-CM | POA: Insufficient documentation

## 2018-05-17 DIAGNOSIS — Y92239 Unspecified place in hospital as the place of occurrence of the external cause: Secondary | ICD-10-CM | POA: Insufficient documentation

## 2018-05-17 DIAGNOSIS — X58XXXA Exposure to other specified factors, initial encounter: Secondary | ICD-10-CM | POA: Insufficient documentation

## 2018-05-17 DIAGNOSIS — T8131XA Disruption of external operation (surgical) wound, not elsewhere classified, initial encounter: Secondary | ICD-10-CM | POA: Insufficient documentation

## 2018-05-18 DIAGNOSIS — X58XXXA Exposure to other specified factors, initial encounter: Secondary | ICD-10-CM | POA: Diagnosis not present

## 2018-05-18 DIAGNOSIS — I130 Hypertensive heart and chronic kidney disease with heart failure and stage 1 through stage 4 chronic kidney disease, or unspecified chronic kidney disease: Secondary | ICD-10-CM | POA: Diagnosis not present

## 2018-05-18 DIAGNOSIS — T8189XA Other complications of procedures, not elsewhere classified, initial encounter: Secondary | ICD-10-CM | POA: Diagnosis not present

## 2018-05-18 DIAGNOSIS — N182 Chronic kidney disease, stage 2 (mild): Secondary | ICD-10-CM | POA: Diagnosis not present

## 2018-05-18 DIAGNOSIS — S99922A Unspecified injury of left foot, initial encounter: Secondary | ICD-10-CM | POA: Diagnosis not present

## 2018-05-18 DIAGNOSIS — F0390 Unspecified dementia without behavioral disturbance: Secondary | ICD-10-CM | POA: Diagnosis not present

## 2018-05-18 DIAGNOSIS — D631 Anemia in chronic kidney disease: Secondary | ICD-10-CM | POA: Diagnosis not present

## 2018-05-18 DIAGNOSIS — S40022A Contusion of left upper arm, initial encounter: Secondary | ICD-10-CM | POA: Diagnosis not present

## 2018-05-18 DIAGNOSIS — I252 Old myocardial infarction: Secondary | ICD-10-CM | POA: Diagnosis not present

## 2018-05-18 DIAGNOSIS — Y92239 Unspecified place in hospital as the place of occurrence of the external cause: Secondary | ICD-10-CM | POA: Diagnosis not present

## 2018-05-18 DIAGNOSIS — L97522 Non-pressure chronic ulcer of other part of left foot with fat layer exposed: Secondary | ICD-10-CM | POA: Diagnosis not present

## 2018-05-18 DIAGNOSIS — L03115 Cellulitis of right lower limb: Secondary | ICD-10-CM | POA: Diagnosis not present

## 2018-05-18 DIAGNOSIS — I5030 Unspecified diastolic (congestive) heart failure: Secondary | ICD-10-CM | POA: Diagnosis not present

## 2018-05-18 DIAGNOSIS — T8131XA Disruption of external operation (surgical) wound, not elsewhere classified, initial encounter: Secondary | ICD-10-CM | POA: Diagnosis not present

## 2018-05-18 DIAGNOSIS — I2510 Atherosclerotic heart disease of native coronary artery without angina pectoris: Secondary | ICD-10-CM | POA: Diagnosis not present

## 2018-05-18 DIAGNOSIS — Y838 Other surgical procedures as the cause of abnormal reaction of the patient, or of later complication, without mention of misadventure at the time of the procedure: Secondary | ICD-10-CM | POA: Diagnosis not present

## 2018-05-18 DIAGNOSIS — K7460 Unspecified cirrhosis of liver: Secondary | ICD-10-CM | POA: Diagnosis not present

## 2018-05-21 ENCOUNTER — Ambulatory Visit (INDEPENDENT_AMBULATORY_CARE_PROVIDER_SITE_OTHER): Payer: Medicare Other | Admitting: Internal Medicine

## 2018-05-21 ENCOUNTER — Telehealth: Payer: Self-pay | Admitting: Cardiovascular Disease

## 2018-05-21 DIAGNOSIS — I5030 Unspecified diastolic (congestive) heart failure: Secondary | ICD-10-CM | POA: Diagnosis not present

## 2018-05-21 DIAGNOSIS — L97522 Non-pressure chronic ulcer of other part of left foot with fat layer exposed: Secondary | ICD-10-CM | POA: Diagnosis not present

## 2018-05-21 DIAGNOSIS — Z5181 Encounter for therapeutic drug level monitoring: Secondary | ICD-10-CM | POA: Diagnosis not present

## 2018-05-21 DIAGNOSIS — I130 Hypertensive heart and chronic kidney disease with heart failure and stage 1 through stage 4 chronic kidney disease, or unspecified chronic kidney disease: Secondary | ICD-10-CM | POA: Diagnosis not present

## 2018-05-21 DIAGNOSIS — N182 Chronic kidney disease, stage 2 (mild): Secondary | ICD-10-CM | POA: Diagnosis not present

## 2018-05-21 DIAGNOSIS — D631 Anemia in chronic kidney disease: Secondary | ICD-10-CM | POA: Diagnosis not present

## 2018-05-21 DIAGNOSIS — I2510 Atherosclerotic heart disease of native coronary artery without angina pectoris: Secondary | ICD-10-CM | POA: Diagnosis not present

## 2018-05-21 LAB — POCT INR: INR: 5 — AB (ref 2.0–3.0)

## 2018-05-21 NOTE — Telephone Encounter (Signed)
New Message:     Stanton Kidney, Nurse from [place] at [facility], says she needs order for when to draw blood for INR at home please.

## 2018-05-21 NOTE — Patient Instructions (Signed)
Description   Spoke with Mary-RN [facility] while in pt's home and instructed to have pt hold today's dose and tomorrow's dose then continue taking 1 tablet daily except 1/2 tablet on Tuesdays and Saturdays. Recheck in 1 week. Call if any changes [phone].

## 2018-05-21 NOTE — Telephone Encounter (Signed)
Spoke with Brittany [name]. Patient is overdue for INR check. She was recently d/c from hospital. Gave orders to check INR today. Patient was also started on doxycycline on Friday for a total of 5 days

## 2018-05-23 DIAGNOSIS — I2510 Atherosclerotic heart disease of native coronary artery without angina pectoris: Secondary | ICD-10-CM | POA: Diagnosis not present

## 2018-05-23 DIAGNOSIS — N182 Chronic kidney disease, stage 2 (mild): Secondary | ICD-10-CM | POA: Diagnosis not present

## 2018-05-23 DIAGNOSIS — I130 Hypertensive heart and chronic kidney disease with heart failure and stage 1 through stage 4 chronic kidney disease, or unspecified chronic kidney disease: Secondary | ICD-10-CM | POA: Diagnosis not present

## 2018-05-23 DIAGNOSIS — L97522 Non-pressure chronic ulcer of other part of left foot with fat layer exposed: Secondary | ICD-10-CM | POA: Diagnosis not present

## 2018-05-23 DIAGNOSIS — D631 Anemia in chronic kidney disease: Secondary | ICD-10-CM | POA: Diagnosis not present

## 2018-05-23 DIAGNOSIS — I5030 Unspecified diastolic (congestive) heart failure: Secondary | ICD-10-CM | POA: Diagnosis not present

## 2018-05-24 DIAGNOSIS — I2510 Atherosclerotic heart disease of native coronary artery without angina pectoris: Secondary | ICD-10-CM | POA: Diagnosis not present

## 2018-05-24 DIAGNOSIS — I5030 Unspecified diastolic (congestive) heart failure: Secondary | ICD-10-CM | POA: Diagnosis not present

## 2018-05-24 DIAGNOSIS — I130 Hypertensive heart and chronic kidney disease with heart failure and stage 1 through stage 4 chronic kidney disease, or unspecified chronic kidney disease: Secondary | ICD-10-CM | POA: Diagnosis not present

## 2018-05-24 DIAGNOSIS — N182 Chronic kidney disease, stage 2 (mild): Secondary | ICD-10-CM | POA: Diagnosis not present

## 2018-05-24 DIAGNOSIS — D631 Anemia in chronic kidney disease: Secondary | ICD-10-CM | POA: Diagnosis not present

## 2018-05-24 DIAGNOSIS — L97522 Non-pressure chronic ulcer of other part of left foot with fat layer exposed: Secondary | ICD-10-CM | POA: Diagnosis not present

## 2018-05-25 DIAGNOSIS — I482 Chronic atrial fibrillation, unspecified: Secondary | ICD-10-CM | POA: Diagnosis not present

## 2018-05-25 DIAGNOSIS — I2510 Atherosclerotic heart disease of native coronary artery without angina pectoris: Secondary | ICD-10-CM | POA: Diagnosis not present

## 2018-05-25 DIAGNOSIS — I5030 Unspecified diastolic (congestive) heart failure: Secondary | ICD-10-CM | POA: Diagnosis not present

## 2018-05-25 DIAGNOSIS — L97522 Non-pressure chronic ulcer of other part of left foot with fat layer exposed: Secondary | ICD-10-CM | POA: Diagnosis not present

## 2018-05-25 DIAGNOSIS — N182 Chronic kidney disease, stage 2 (mild): Secondary | ICD-10-CM | POA: Diagnosis not present

## 2018-05-25 DIAGNOSIS — N390 Urinary tract infection, site not specified: Secondary | ICD-10-CM | POA: Diagnosis not present

## 2018-05-25 DIAGNOSIS — D631 Anemia in chronic kidney disease: Secondary | ICD-10-CM | POA: Diagnosis not present

## 2018-05-25 DIAGNOSIS — I130 Hypertensive heart and chronic kidney disease with heart failure and stage 1 through stage 4 chronic kidney disease, or unspecified chronic kidney disease: Secondary | ICD-10-CM | POA: Diagnosis not present

## 2018-05-25 DIAGNOSIS — G9340 Encephalopathy, unspecified: Secondary | ICD-10-CM | POA: Diagnosis not present

## 2018-05-25 DIAGNOSIS — E871 Hypo-osmolality and hyponatremia: Secondary | ICD-10-CM | POA: Diagnosis not present

## 2018-05-26 DIAGNOSIS — L97522 Non-pressure chronic ulcer of other part of left foot with fat layer exposed: Secondary | ICD-10-CM | POA: Diagnosis not present

## 2018-05-26 DIAGNOSIS — I5030 Unspecified diastolic (congestive) heart failure: Secondary | ICD-10-CM | POA: Diagnosis not present

## 2018-05-26 DIAGNOSIS — D631 Anemia in chronic kidney disease: Secondary | ICD-10-CM | POA: Diagnosis not present

## 2018-05-26 DIAGNOSIS — I130 Hypertensive heart and chronic kidney disease with heart failure and stage 1 through stage 4 chronic kidney disease, or unspecified chronic kidney disease: Secondary | ICD-10-CM | POA: Diagnosis not present

## 2018-05-26 DIAGNOSIS — I2510 Atherosclerotic heart disease of native coronary artery without angina pectoris: Secondary | ICD-10-CM | POA: Diagnosis not present

## 2018-05-26 DIAGNOSIS — N182 Chronic kidney disease, stage 2 (mild): Secondary | ICD-10-CM | POA: Diagnosis not present

## 2018-05-28 ENCOUNTER — Ambulatory Visit (INDEPENDENT_AMBULATORY_CARE_PROVIDER_SITE_OTHER): Payer: Medicare Other

## 2018-05-28 DIAGNOSIS — I130 Hypertensive heart and chronic kidney disease with heart failure and stage 1 through stage 4 chronic kidney disease, or unspecified chronic kidney disease: Secondary | ICD-10-CM | POA: Diagnosis not present

## 2018-05-28 DIAGNOSIS — I5030 Unspecified diastolic (congestive) heart failure: Secondary | ICD-10-CM | POA: Diagnosis not present

## 2018-05-28 DIAGNOSIS — N182 Chronic kidney disease, stage 2 (mild): Secondary | ICD-10-CM | POA: Diagnosis not present

## 2018-05-28 DIAGNOSIS — I482 Chronic atrial fibrillation, unspecified: Secondary | ICD-10-CM

## 2018-05-28 DIAGNOSIS — I2510 Atherosclerotic heart disease of native coronary artery without angina pectoris: Secondary | ICD-10-CM | POA: Diagnosis not present

## 2018-05-28 DIAGNOSIS — D631 Anemia in chronic kidney disease: Secondary | ICD-10-CM | POA: Diagnosis not present

## 2018-05-28 DIAGNOSIS — Z5181 Encounter for therapeutic drug level monitoring: Secondary | ICD-10-CM

## 2018-05-28 DIAGNOSIS — L97522 Non-pressure chronic ulcer of other part of left foot with fat layer exposed: Secondary | ICD-10-CM | POA: Diagnosis not present

## 2018-05-28 LAB — POCT INR: INR: 1.4 — AB (ref 2.0–3.0)

## 2018-05-28 NOTE — Patient Instructions (Signed)
Description   Spoke with Kendrice-RN [facility] while in pt's home and instructed to have pt take an extra 1/2 tablet today and tomorrow, then resume same dosage 1 tablet daily except 1/2 tablet on Tuesdays and Saturdays. Recheck in 1 week. Call if any changes [phone].

## 2018-05-30 DIAGNOSIS — D631 Anemia in chronic kidney disease: Secondary | ICD-10-CM | POA: Diagnosis not present

## 2018-05-30 DIAGNOSIS — I5030 Unspecified diastolic (congestive) heart failure: Secondary | ICD-10-CM | POA: Diagnosis not present

## 2018-05-30 DIAGNOSIS — M19011 Primary osteoarthritis, right shoulder: Secondary | ICD-10-CM | POA: Diagnosis not present

## 2018-05-30 DIAGNOSIS — I130 Hypertensive heart and chronic kidney disease with heart failure and stage 1 through stage 4 chronic kidney disease, or unspecified chronic kidney disease: Secondary | ICD-10-CM | POA: Diagnosis not present

## 2018-05-30 DIAGNOSIS — I2510 Atherosclerotic heart disease of native coronary artery without angina pectoris: Secondary | ICD-10-CM | POA: Diagnosis not present

## 2018-05-30 DIAGNOSIS — L97522 Non-pressure chronic ulcer of other part of left foot with fat layer exposed: Secondary | ICD-10-CM | POA: Diagnosis not present

## 2018-05-30 DIAGNOSIS — N182 Chronic kidney disease, stage 2 (mild): Secondary | ICD-10-CM | POA: Diagnosis not present

## 2018-05-31 DIAGNOSIS — N182 Chronic kidney disease, stage 2 (mild): Secondary | ICD-10-CM | POA: Diagnosis not present

## 2018-05-31 DIAGNOSIS — L97522 Non-pressure chronic ulcer of other part of left foot with fat layer exposed: Secondary | ICD-10-CM | POA: Diagnosis not present

## 2018-05-31 DIAGNOSIS — I2510 Atherosclerotic heart disease of native coronary artery without angina pectoris: Secondary | ICD-10-CM | POA: Diagnosis not present

## 2018-05-31 DIAGNOSIS — D631 Anemia in chronic kidney disease: Secondary | ICD-10-CM | POA: Diagnosis not present

## 2018-05-31 DIAGNOSIS — I130 Hypertensive heart and chronic kidney disease with heart failure and stage 1 through stage 4 chronic kidney disease, or unspecified chronic kidney disease: Secondary | ICD-10-CM | POA: Diagnosis not present

## 2018-05-31 DIAGNOSIS — I5030 Unspecified diastolic (congestive) heart failure: Secondary | ICD-10-CM | POA: Diagnosis not present

## 2018-06-01 DIAGNOSIS — L03115 Cellulitis of right lower limb: Secondary | ICD-10-CM | POA: Diagnosis not present

## 2018-06-01 DIAGNOSIS — T8189XA Other complications of procedures, not elsewhere classified, initial encounter: Secondary | ICD-10-CM | POA: Diagnosis not present

## 2018-06-01 DIAGNOSIS — S40022A Contusion of left upper arm, initial encounter: Secondary | ICD-10-CM | POA: Diagnosis not present

## 2018-06-01 DIAGNOSIS — S99922A Unspecified injury of left foot, initial encounter: Secondary | ICD-10-CM | POA: Diagnosis not present

## 2018-06-01 DIAGNOSIS — I2510 Atherosclerotic heart disease of native coronary artery without angina pectoris: Secondary | ICD-10-CM | POA: Diagnosis not present

## 2018-06-01 DIAGNOSIS — T8131XA Disruption of external operation (surgical) wound, not elsewhere classified, initial encounter: Secondary | ICD-10-CM | POA: Diagnosis not present

## 2018-06-01 DIAGNOSIS — K7460 Unspecified cirrhosis of liver: Secondary | ICD-10-CM | POA: Diagnosis not present

## 2018-06-04 ENCOUNTER — Ambulatory Visit (INDEPENDENT_AMBULATORY_CARE_PROVIDER_SITE_OTHER): Payer: Medicare Other | Admitting: Internal Medicine

## 2018-06-04 DIAGNOSIS — I482 Chronic atrial fibrillation, unspecified: Secondary | ICD-10-CM

## 2018-06-04 DIAGNOSIS — M48061 Spinal stenosis, lumbar region without neurogenic claudication: Secondary | ICD-10-CM | POA: Diagnosis not present

## 2018-06-04 DIAGNOSIS — Z5181 Encounter for therapeutic drug level monitoring: Secondary | ICD-10-CM

## 2018-06-04 DIAGNOSIS — I5030 Unspecified diastolic (congestive) heart failure: Secondary | ICD-10-CM | POA: Diagnosis not present

## 2018-06-04 DIAGNOSIS — M542 Cervicalgia: Secondary | ICD-10-CM | POA: Diagnosis not present

## 2018-06-04 DIAGNOSIS — I2510 Atherosclerotic heart disease of native coronary artery without angina pectoris: Secondary | ICD-10-CM | POA: Diagnosis not present

## 2018-06-04 DIAGNOSIS — M5416 Radiculopathy, lumbar region: Secondary | ICD-10-CM | POA: Diagnosis not present

## 2018-06-04 DIAGNOSIS — D631 Anemia in chronic kidney disease: Secondary | ICD-10-CM | POA: Diagnosis not present

## 2018-06-04 DIAGNOSIS — I10 Essential (primary) hypertension: Secondary | ICD-10-CM | POA: Diagnosis not present

## 2018-06-04 DIAGNOSIS — N182 Chronic kidney disease, stage 2 (mild): Secondary | ICD-10-CM | POA: Diagnosis not present

## 2018-06-04 DIAGNOSIS — L97522 Non-pressure chronic ulcer of other part of left foot with fat layer exposed: Secondary | ICD-10-CM | POA: Diagnosis not present

## 2018-06-04 DIAGNOSIS — I130 Hypertensive heart and chronic kidney disease with heart failure and stage 1 through stage 4 chronic kidney disease, or unspecified chronic kidney disease: Secondary | ICD-10-CM | POA: Diagnosis not present

## 2018-06-04 LAB — POCT INR: INR: 2.9 (ref 2.0–3.0)

## 2018-06-04 NOTE — Patient Instructions (Addendum)
Description   Spoke with Kendrice-RN [facility] while in pt's home and instructed to have pt continue taking 1 tablet daily except 1/2 tablet on Tuesdays and Saturdays. Recheck in 1 week prior to 12 noon. Call if any changes [phone].

## 2018-06-05 DIAGNOSIS — N182 Chronic kidney disease, stage 2 (mild): Secondary | ICD-10-CM | POA: Diagnosis not present

## 2018-06-05 DIAGNOSIS — I2510 Atherosclerotic heart disease of native coronary artery without angina pectoris: Secondary | ICD-10-CM | POA: Diagnosis not present

## 2018-06-05 DIAGNOSIS — I5030 Unspecified diastolic (congestive) heart failure: Secondary | ICD-10-CM | POA: Diagnosis not present

## 2018-06-05 DIAGNOSIS — R300 Dysuria: Secondary | ICD-10-CM | POA: Diagnosis not present

## 2018-06-05 DIAGNOSIS — I130 Hypertensive heart and chronic kidney disease with heart failure and stage 1 through stage 4 chronic kidney disease, or unspecified chronic kidney disease: Secondary | ICD-10-CM | POA: Diagnosis not present

## 2018-06-05 DIAGNOSIS — N3000 Acute cystitis without hematuria: Secondary | ICD-10-CM | POA: Diagnosis not present

## 2018-06-05 DIAGNOSIS — D631 Anemia in chronic kidney disease: Secondary | ICD-10-CM | POA: Diagnosis not present

## 2018-06-05 DIAGNOSIS — L97522 Non-pressure chronic ulcer of other part of left foot with fat layer exposed: Secondary | ICD-10-CM | POA: Diagnosis not present

## 2018-06-06 ENCOUNTER — Telehealth: Payer: Self-pay | Admitting: *Deleted

## 2018-06-06 ENCOUNTER — Ambulatory Visit: Payer: Medicare Other | Admitting: Emergency Medicine

## 2018-06-06 DIAGNOSIS — D631 Anemia in chronic kidney disease: Secondary | ICD-10-CM | POA: Diagnosis not present

## 2018-06-06 DIAGNOSIS — L97522 Non-pressure chronic ulcer of other part of left foot with fat layer exposed: Secondary | ICD-10-CM | POA: Diagnosis not present

## 2018-06-06 DIAGNOSIS — N182 Chronic kidney disease, stage 2 (mild): Secondary | ICD-10-CM | POA: Diagnosis not present

## 2018-06-06 DIAGNOSIS — I2510 Atherosclerotic heart disease of native coronary artery without angina pectoris: Secondary | ICD-10-CM | POA: Diagnosis not present

## 2018-06-06 DIAGNOSIS — I130 Hypertensive heart and chronic kidney disease with heart failure and stage 1 through stage 4 chronic kidney disease, or unspecified chronic kidney disease: Secondary | ICD-10-CM | POA: Diagnosis not present

## 2018-06-06 DIAGNOSIS — I5030 Unspecified diastolic (congestive) heart failure: Secondary | ICD-10-CM | POA: Diagnosis not present

## 2018-06-06 NOTE — Telephone Encounter (Signed)
Brittany [name] with [facility] called and stated the pt started Cipro BID for 14 days today. She is aware that the med interacts with Coumadin and we need to check the INR. Gave her an order to check the INR on Friday and call to [place] at [phone].

## 2018-06-08 ENCOUNTER — Ambulatory Visit (INDEPENDENT_AMBULATORY_CARE_PROVIDER_SITE_OTHER): Payer: Medicare Other | Admitting: Internal Medicine

## 2018-06-08 DIAGNOSIS — D631 Anemia in chronic kidney disease: Secondary | ICD-10-CM | POA: Diagnosis not present

## 2018-06-08 DIAGNOSIS — Z5181 Encounter for therapeutic drug level monitoring: Secondary | ICD-10-CM | POA: Diagnosis not present

## 2018-06-08 DIAGNOSIS — I482 Chronic atrial fibrillation, unspecified: Secondary | ICD-10-CM | POA: Diagnosis not present

## 2018-06-08 DIAGNOSIS — I130 Hypertensive heart and chronic kidney disease with heart failure and stage 1 through stage 4 chronic kidney disease, or unspecified chronic kidney disease: Secondary | ICD-10-CM | POA: Diagnosis not present

## 2018-06-08 DIAGNOSIS — N182 Chronic kidney disease, stage 2 (mild): Secondary | ICD-10-CM | POA: Diagnosis not present

## 2018-06-08 DIAGNOSIS — L97522 Non-pressure chronic ulcer of other part of left foot with fat layer exposed: Secondary | ICD-10-CM | POA: Diagnosis not present

## 2018-06-08 DIAGNOSIS — I5030 Unspecified diastolic (congestive) heart failure: Secondary | ICD-10-CM | POA: Diagnosis not present

## 2018-06-08 DIAGNOSIS — I2510 Atherosclerotic heart disease of native coronary artery without angina pectoris: Secondary | ICD-10-CM | POA: Diagnosis not present

## 2018-06-08 LAB — POCT INR: INR: 2.7 (ref 2.0–3.0)

## 2018-06-09 DIAGNOSIS — I2510 Atherosclerotic heart disease of native coronary artery without angina pectoris: Secondary | ICD-10-CM | POA: Diagnosis not present

## 2018-06-09 DIAGNOSIS — H3530 Unspecified macular degeneration: Secondary | ICD-10-CM | POA: Diagnosis not present

## 2018-06-09 DIAGNOSIS — N183 Chronic kidney disease, stage 3 (moderate): Secondary | ICD-10-CM | POA: Diagnosis not present

## 2018-06-09 DIAGNOSIS — I272 Pulmonary hypertension, unspecified: Secondary | ICD-10-CM | POA: Diagnosis not present

## 2018-06-09 DIAGNOSIS — L97529 Non-pressure chronic ulcer of other part of left foot with unspecified severity: Secondary | ICD-10-CM | POA: Diagnosis not present

## 2018-06-09 DIAGNOSIS — Z9181 History of falling: Secondary | ICD-10-CM | POA: Diagnosis not present

## 2018-06-09 DIAGNOSIS — Z87891 Personal history of nicotine dependence: Secondary | ICD-10-CM | POA: Diagnosis not present

## 2018-06-09 DIAGNOSIS — Z86010 Personal history of colonic polyps: Secondary | ICD-10-CM | POA: Diagnosis not present

## 2018-06-09 DIAGNOSIS — J439 Emphysema, unspecified: Secondary | ICD-10-CM | POA: Diagnosis not present

## 2018-06-09 DIAGNOSIS — Z7901 Long term (current) use of anticoagulants: Secondary | ICD-10-CM | POA: Diagnosis not present

## 2018-06-09 DIAGNOSIS — M8580 Other specified disorders of bone density and structure, unspecified site: Secondary | ICD-10-CM | POA: Diagnosis not present

## 2018-06-09 DIAGNOSIS — E782 Mixed hyperlipidemia: Secondary | ICD-10-CM | POA: Diagnosis not present

## 2018-06-09 DIAGNOSIS — I5032 Chronic diastolic (congestive) heart failure: Secondary | ICD-10-CM | POA: Diagnosis not present

## 2018-06-09 DIAGNOSIS — Z954 Presence of other heart-valve replacement: Secondary | ICD-10-CM | POA: Diagnosis not present

## 2018-06-09 DIAGNOSIS — I252 Old myocardial infarction: Secondary | ICD-10-CM | POA: Diagnosis not present

## 2018-06-09 DIAGNOSIS — I130 Hypertensive heart and chronic kidney disease with heart failure and stage 1 through stage 4 chronic kidney disease, or unspecified chronic kidney disease: Secondary | ICD-10-CM | POA: Diagnosis not present

## 2018-06-09 DIAGNOSIS — H547 Unspecified visual loss: Secondary | ICD-10-CM | POA: Diagnosis not present

## 2018-06-09 DIAGNOSIS — Z8673 Personal history of transient ischemic attack (TIA), and cerebral infarction without residual deficits: Secondary | ICD-10-CM | POA: Diagnosis not present

## 2018-06-09 DIAGNOSIS — I69393 Ataxia following cerebral infarction: Secondary | ICD-10-CM | POA: Diagnosis not present

## 2018-06-09 DIAGNOSIS — M1991 Primary osteoarthritis, unspecified site: Secondary | ICD-10-CM | POA: Diagnosis not present

## 2018-06-09 DIAGNOSIS — Z87440 Personal history of urinary (tract) infections: Secondary | ICD-10-CM | POA: Diagnosis not present

## 2018-06-09 DIAGNOSIS — K5900 Constipation, unspecified: Secondary | ICD-10-CM | POA: Diagnosis not present

## 2018-06-09 DIAGNOSIS — F0390 Unspecified dementia without behavioral disturbance: Secondary | ICD-10-CM | POA: Diagnosis not present

## 2018-06-11 DIAGNOSIS — I5032 Chronic diastolic (congestive) heart failure: Secondary | ICD-10-CM | POA: Diagnosis not present

## 2018-06-11 DIAGNOSIS — I69393 Ataxia following cerebral infarction: Secondary | ICD-10-CM | POA: Diagnosis not present

## 2018-06-11 DIAGNOSIS — M1991 Primary osteoarthritis, unspecified site: Secondary | ICD-10-CM | POA: Diagnosis not present

## 2018-06-11 DIAGNOSIS — N183 Chronic kidney disease, stage 3 (moderate): Secondary | ICD-10-CM | POA: Diagnosis not present

## 2018-06-11 DIAGNOSIS — I130 Hypertensive heart and chronic kidney disease with heart failure and stage 1 through stage 4 chronic kidney disease, or unspecified chronic kidney disease: Secondary | ICD-10-CM | POA: Diagnosis not present

## 2018-06-11 DIAGNOSIS — L97529 Non-pressure chronic ulcer of other part of left foot with unspecified severity: Secondary | ICD-10-CM | POA: Diagnosis not present

## 2018-06-12 DIAGNOSIS — I5032 Chronic diastolic (congestive) heart failure: Secondary | ICD-10-CM | POA: Diagnosis not present

## 2018-06-12 DIAGNOSIS — L97529 Non-pressure chronic ulcer of other part of left foot with unspecified severity: Secondary | ICD-10-CM | POA: Diagnosis not present

## 2018-06-12 DIAGNOSIS — I69393 Ataxia following cerebral infarction: Secondary | ICD-10-CM | POA: Diagnosis not present

## 2018-06-12 DIAGNOSIS — M1991 Primary osteoarthritis, unspecified site: Secondary | ICD-10-CM | POA: Diagnosis not present

## 2018-06-12 DIAGNOSIS — N183 Chronic kidney disease, stage 3 (moderate): Secondary | ICD-10-CM | POA: Diagnosis not present

## 2018-06-12 DIAGNOSIS — I130 Hypertensive heart and chronic kidney disease with heart failure and stage 1 through stage 4 chronic kidney disease, or unspecified chronic kidney disease: Secondary | ICD-10-CM | POA: Diagnosis not present

## 2018-06-13 DIAGNOSIS — L97529 Non-pressure chronic ulcer of other part of left foot with unspecified severity: Secondary | ICD-10-CM | POA: Diagnosis not present

## 2018-06-13 DIAGNOSIS — I130 Hypertensive heart and chronic kidney disease with heart failure and stage 1 through stage 4 chronic kidney disease, or unspecified chronic kidney disease: Secondary | ICD-10-CM | POA: Diagnosis not present

## 2018-06-13 DIAGNOSIS — I5032 Chronic diastolic (congestive) heart failure: Secondary | ICD-10-CM | POA: Diagnosis not present

## 2018-06-13 DIAGNOSIS — N183 Chronic kidney disease, stage 3 (moderate): Secondary | ICD-10-CM | POA: Diagnosis not present

## 2018-06-13 DIAGNOSIS — I69393 Ataxia following cerebral infarction: Secondary | ICD-10-CM | POA: Diagnosis not present

## 2018-06-13 DIAGNOSIS — M1991 Primary osteoarthritis, unspecified site: Secondary | ICD-10-CM | POA: Diagnosis not present

## 2018-06-14 ENCOUNTER — Encounter (HOSPITAL_COMMUNITY): Payer: Self-pay

## 2018-06-14 ENCOUNTER — Observation Stay (HOSPITAL_COMMUNITY): Payer: Medicare Other

## 2018-06-14 ENCOUNTER — Inpatient Hospital Stay (HOSPITAL_COMMUNITY)
Admission: EM | Admit: 2018-06-14 | Discharge: 2018-06-21 | DRG: 871 | Disposition: A | Discharge status: Home Health | Payer: Medicare Other | Attending: Internal Medicine | Admitting: Internal Medicine

## 2018-06-14 ENCOUNTER — Other Ambulatory Visit: Payer: Self-pay

## 2018-06-14 ENCOUNTER — Emergency Department (HOSPITAL_COMMUNITY): Payer: Medicare Other

## 2018-06-14 DIAGNOSIS — J90 Pleural effusion, not elsewhere classified: Secondary | ICD-10-CM | POA: Diagnosis not present

## 2018-06-14 DIAGNOSIS — L97529 Non-pressure chronic ulcer of other part of left foot with unspecified severity: Secondary | ICD-10-CM | POA: Diagnosis present

## 2018-06-14 DIAGNOSIS — Z974 Presence of external hearing-aid: Secondary | ICD-10-CM

## 2018-06-14 DIAGNOSIS — Z800 Family history of malignant neoplasm of digestive organs: Secondary | ICD-10-CM

## 2018-06-14 DIAGNOSIS — M545 Low back pain: Secondary | ICD-10-CM | POA: Diagnosis not present

## 2018-06-14 DIAGNOSIS — R0902 Hypoxemia: Secondary | ICD-10-CM | POA: Diagnosis not present

## 2018-06-14 DIAGNOSIS — G9341 Metabolic encephalopathy: Secondary | ICD-10-CM | POA: Diagnosis not present

## 2018-06-14 DIAGNOSIS — N390 Urinary tract infection, site not specified: Secondary | ICD-10-CM | POA: Diagnosis not present

## 2018-06-14 DIAGNOSIS — K219 Gastro-esophageal reflux disease without esophagitis: Secondary | ICD-10-CM | POA: Diagnosis not present

## 2018-06-14 DIAGNOSIS — Z7901 Long term (current) use of anticoagulants: Secondary | ICD-10-CM

## 2018-06-14 DIAGNOSIS — R58 Hemorrhage, not elsewhere classified: Secondary | ICD-10-CM | POA: Diagnosis not present

## 2018-06-14 DIAGNOSIS — R079 Chest pain, unspecified: Secondary | ICD-10-CM | POA: Diagnosis present

## 2018-06-14 DIAGNOSIS — Z8711 Personal history of peptic ulcer disease: Secondary | ICD-10-CM

## 2018-06-14 DIAGNOSIS — L03116 Cellulitis of left lower limb: Secondary | ICD-10-CM | POA: Diagnosis present

## 2018-06-14 DIAGNOSIS — Z885 Allergy status to narcotic agent status: Secondary | ICD-10-CM

## 2018-06-14 DIAGNOSIS — E871 Hypo-osmolality and hyponatremia: Secondary | ICD-10-CM | POA: Diagnosis present

## 2018-06-14 DIAGNOSIS — I10 Essential (primary) hypertension: Secondary | ICD-10-CM

## 2018-06-14 DIAGNOSIS — A419 Sepsis, unspecified organism: Secondary | ICD-10-CM | POA: Diagnosis not present

## 2018-06-14 DIAGNOSIS — A408 Other streptococcal sepsis: Principal | ICD-10-CM | POA: Diagnosis present

## 2018-06-14 DIAGNOSIS — I482 Chronic atrial fibrillation, unspecified: Secondary | ICD-10-CM | POA: Diagnosis present

## 2018-06-14 DIAGNOSIS — R000 Tachycardia, unspecified: Secondary | ICD-10-CM | POA: Diagnosis not present

## 2018-06-14 DIAGNOSIS — I272 Pulmonary hypertension, unspecified: Secondary | ICD-10-CM | POA: Diagnosis present

## 2018-06-14 DIAGNOSIS — Z8249 Family history of ischemic heart disease and other diseases of the circulatory system: Secondary | ICD-10-CM

## 2018-06-14 DIAGNOSIS — B955 Unspecified streptococcus as the cause of diseases classified elsewhere: Secondary | ICD-10-CM

## 2018-06-14 DIAGNOSIS — I350 Nonrheumatic aortic (valve) stenosis: Secondary | ICD-10-CM

## 2018-06-14 DIAGNOSIS — Z818 Family history of other mental and behavioral disorders: Secondary | ICD-10-CM

## 2018-06-14 DIAGNOSIS — I5032 Chronic diastolic (congestive) heart failure: Secondary | ICD-10-CM | POA: Diagnosis present

## 2018-06-14 DIAGNOSIS — I130 Hypertensive heart and chronic kidney disease with heart failure and stage 1 through stage 4 chronic kidney disease, or unspecified chronic kidney disease: Secondary | ICD-10-CM | POA: Diagnosis not present

## 2018-06-14 DIAGNOSIS — H9190 Unspecified hearing loss, unspecified ear: Secondary | ICD-10-CM | POA: Diagnosis present

## 2018-06-14 DIAGNOSIS — Z881 Allergy status to other antibiotic agents status: Secondary | ICD-10-CM

## 2018-06-14 DIAGNOSIS — R7881 Bacteremia: Secondary | ICD-10-CM

## 2018-06-14 DIAGNOSIS — Z811 Family history of alcohol abuse and dependence: Secondary | ICD-10-CM

## 2018-06-14 DIAGNOSIS — K449 Diaphragmatic hernia without obstruction or gangrene: Secondary | ICD-10-CM | POA: Diagnosis present

## 2018-06-14 DIAGNOSIS — G9340 Encephalopathy, unspecified: Secondary | ICD-10-CM | POA: Diagnosis not present

## 2018-06-14 DIAGNOSIS — Z95828 Presence of other vascular implants and grafts: Secondary | ICD-10-CM

## 2018-06-14 DIAGNOSIS — T148XXA Other injury of unspecified body region, initial encounter: Secondary | ICD-10-CM

## 2018-06-14 DIAGNOSIS — M21372 Foot drop, left foot: Secondary | ICD-10-CM | POA: Diagnosis present

## 2018-06-14 DIAGNOSIS — Z87440 Personal history of urinary (tract) infections: Secondary | ICD-10-CM

## 2018-06-14 DIAGNOSIS — F0390 Unspecified dementia without behavioral disturbance: Secondary | ICD-10-CM | POA: Diagnosis not present

## 2018-06-14 DIAGNOSIS — Z8673 Personal history of transient ischemic attack (TIA), and cerebral infarction without residual deficits: Secondary | ICD-10-CM

## 2018-06-14 DIAGNOSIS — I25119 Atherosclerotic heart disease of native coronary artery with unspecified angina pectoris: Secondary | ICD-10-CM | POA: Diagnosis present

## 2018-06-14 DIAGNOSIS — Z66 Do not resuscitate: Secondary | ICD-10-CM | POA: Diagnosis present

## 2018-06-14 DIAGNOSIS — R509 Fever, unspecified: Secondary | ICD-10-CM | POA: Diagnosis not present

## 2018-06-14 DIAGNOSIS — N183 Chronic kidney disease, stage 3 (moderate): Secondary | ICD-10-CM | POA: Diagnosis present

## 2018-06-14 DIAGNOSIS — M549 Dorsalgia, unspecified: Secondary | ICD-10-CM | POA: Diagnosis present

## 2018-06-14 DIAGNOSIS — G8929 Other chronic pain: Secondary | ICD-10-CM | POA: Diagnosis present

## 2018-06-14 DIAGNOSIS — J439 Emphysema, unspecified: Secondary | ICD-10-CM | POA: Diagnosis present

## 2018-06-14 DIAGNOSIS — S91302A Unspecified open wound, left foot, initial encounter: Secondary | ICD-10-CM | POA: Diagnosis not present

## 2018-06-14 DIAGNOSIS — K7460 Unspecified cirrhosis of liver: Secondary | ICD-10-CM | POA: Diagnosis present

## 2018-06-14 DIAGNOSIS — K5790 Diverticulosis of intestine, part unspecified, without perforation or abscess without bleeding: Secondary | ICD-10-CM | POA: Diagnosis present

## 2018-06-14 DIAGNOSIS — L089 Local infection of the skin and subcutaneous tissue, unspecified: Secondary | ICD-10-CM

## 2018-06-14 DIAGNOSIS — Z882 Allergy status to sulfonamides status: Secondary | ICD-10-CM

## 2018-06-14 DIAGNOSIS — I4891 Unspecified atrial fibrillation: Secondary | ICD-10-CM | POA: Diagnosis not present

## 2018-06-14 DIAGNOSIS — Z953 Presence of xenogenic heart valve: Secondary | ICD-10-CM

## 2018-06-14 DIAGNOSIS — R197 Diarrhea, unspecified: Secondary | ICD-10-CM | POA: Diagnosis not present

## 2018-06-14 DIAGNOSIS — S3992XA Unspecified injury of lower back, initial encounter: Secondary | ICD-10-CM | POA: Diagnosis not present

## 2018-06-14 DIAGNOSIS — Z955 Presence of coronary angioplasty implant and graft: Secondary | ICD-10-CM

## 2018-06-14 DIAGNOSIS — Z87891 Personal history of nicotine dependence: Secondary | ICD-10-CM

## 2018-06-14 DIAGNOSIS — E785 Hyperlipidemia, unspecified: Secondary | ICD-10-CM | POA: Diagnosis present

## 2018-06-14 DIAGNOSIS — Z888 Allergy status to other drugs, medicaments and biological substances status: Secondary | ICD-10-CM

## 2018-06-14 LAB — PROTIME-INR
INR: 1.9 — ABNORMAL HIGH (ref 0.8–1.2)
Prothrombin Time: 21.9 seconds — ABNORMAL HIGH (ref 11.4–15.2)

## 2018-06-14 LAB — CBC WITH DIFFERENTIAL/PLATELET
Abs Immature Granulocytes: 0.18 10*3/uL — ABNORMAL HIGH (ref 0.00–0.07)
Basophils Absolute: 0 10*3/uL (ref 0.0–0.1)
Basophils Relative: 0 %
Eosinophils Absolute: 0.1 10*3/uL (ref 0.0–0.5)
Eosinophils Relative: 0 %
HCT: 40.4 % (ref 36.0–46.0)
Hemoglobin: 13.3 g/dL (ref 12.0–15.0)
Immature Granulocytes: 1 %
Lymphocytes Relative: 3 %
Lymphs Abs: 0.6 10*3/uL — ABNORMAL LOW (ref 0.7–4.0)
MCH: 30.1 pg (ref 26.0–34.0)
MCHC: 32.9 g/dL (ref 30.0–36.0)
MCV: 91.4 fL (ref 80.0–100.0)
Monocytes Absolute: 0.5 10*3/uL (ref 0.1–1.0)
Monocytes Relative: 3 %
Neutro Abs: 17.6 10*3/uL — ABNORMAL HIGH (ref 1.7–7.7)
Neutrophils Relative %: 93 %
Platelets: 256 10*3/uL (ref 150–400)
RBC: 4.42 MIL/uL (ref 3.87–5.11)
RDW: 13.4 % (ref 11.5–15.5)
WBC: 19 10*3/uL — ABNORMAL HIGH (ref 4.0–10.5)
nRBC: 0 % (ref 0.0–0.2)

## 2018-06-14 LAB — COMPREHENSIVE METABOLIC PANEL
ALT: 26 U/L (ref 0–44)
AST: 31 U/L (ref 15–41)
Albumin: 3.8 g/dL (ref 3.5–5.0)
Alkaline Phosphatase: 90 U/L (ref 38–126)
Anion gap: 10 (ref 5–15)
BUN: 18 mg/dL (ref 8–23)
CO2: 25 mmol/L (ref 22–32)
Calcium: 8.9 mg/dL (ref 8.9–10.3)
Chloride: 95 mmol/L — ABNORMAL LOW (ref 98–111)
Creatinine, Ser: 0.78 mg/dL (ref 0.44–1.00)
GFR calc Af Amer: >60 mL/min (ref >60)
GFR calc non Af Amer: >60 mL/min (ref >60)
Glucose, Bld: 91 mg/dL (ref 70–99)
Potassium: 4.1 mmol/L (ref 3.5–5.1)
Sodium: 130 mmol/L — ABNORMAL LOW (ref 135–145)
Total Bilirubin: 1.2 mg/dL (ref 0.3–1.2)
Total Protein: 8.2 g/dL — ABNORMAL HIGH (ref 6.5–8.1)

## 2018-06-14 LAB — LACTIC ACID, PLASMA: Lactic Acid, Venous: 1.6 mmol/L (ref 0.5–1.9)

## 2018-06-14 LAB — BLOOD CULTURE ID PANEL (REFLEXED)

## 2018-06-14 LAB — URINALYSIS, ROUTINE W REFLEX MICROSCOPIC
Bacteria, UA: NONE SEEN
Bilirubin Urine: NEGATIVE
Glucose, UA: NEGATIVE mg/dL
Ketones, ur: NEGATIVE mg/dL
Leukocytes,Ua: NEGATIVE
Nitrite: NEGATIVE
Protein, ur: NEGATIVE mg/dL
Specific Gravity, Urine: 1.01 (ref 1.005–1.030)
pH: 7 (ref 5.0–8.0)

## 2018-06-14 LAB — INFLUENZA PANEL BY PCR (TYPE A & B)
Influenza A By PCR: NEGATIVE
Influenza B By PCR: NEGATIVE

## 2018-06-14 LAB — MRSA PCR SCREENING: MRSA by PCR: NEGATIVE

## 2018-06-14 MED ORDER — SODIUM CHLORIDE 0.9 % IV BOLUS
500.0000 mL | Freq: Once | INTRAVENOUS | Status: AC
  Administered 2018-06-14: 500 mL via INTRAVENOUS

## 2018-06-14 MED ORDER — ENOXAPARIN SODIUM 40 MG/0.4ML ~~LOC~~ SOLN
40.0000 mg | SUBCUTANEOUS | Status: DC
  Administered 2018-06-14: 40 mg via SUBCUTANEOUS
  Filled 2018-06-14: qty 0.4

## 2018-06-14 MED ORDER — POLYVINYL ALCOHOL 1.4 % OP SOLN
1.0000 [drp] | Freq: Two times a day (BID) | OPHTHALMIC | Status: DC
  Administered 2018-06-14 – 2018-06-21 (×13): 1 [drp] via OPHTHALMIC
  Filled 2018-06-14: qty 15

## 2018-06-14 MED ORDER — NITROGLYCERIN 0.4 MG SL SUBL
0.4000 mg | SUBLINGUAL_TABLET | SUBLINGUAL | Status: DC | PRN
  Administered 2018-06-17 – 2018-06-21 (×5): 0.4 mg via SUBLINGUAL
  Filled 2018-06-14 (×5): qty 1

## 2018-06-14 MED ORDER — DILTIAZEM HCL 60 MG PO TABS
120.0000 mg | ORAL_TABLET | Freq: Two times a day (BID) | ORAL | Status: DC
  Administered 2018-06-14 – 2018-06-21 (×10): 120 mg via ORAL
  Filled 2018-06-14 (×14): qty 2

## 2018-06-14 MED ORDER — WARFARIN SODIUM 6 MG PO TABS
6.0000 mg | ORAL_TABLET | Freq: Once | ORAL | Status: DC
  Filled 2018-06-14: qty 1

## 2018-06-14 MED ORDER — ONDANSETRON HCL 4 MG PO TABS: 8.0000 mg | ORAL_TABLET | Freq: Two times a day (BID) | ORAL | Status: DC

## 2018-06-14 MED ORDER — ACETAMINOPHEN 650 MG RE SUPP: 650.0000 mg | Freq: Four times a day (QID) | RECTAL | Status: DC | PRN

## 2018-06-14 MED ORDER — SORBITOL 70 % SOLN
30.0000 mL | Freq: Every day | Status: DC | PRN
  Filled 2018-06-14: qty 30

## 2018-06-14 MED ORDER — VANCOMYCIN HCL IN DEXTROSE 1-5 GM/200ML-% IV SOLN
1000.0000 mg | Freq: Once | INTRAVENOUS | Status: AC
  Administered 2018-06-14: 1000 mg via INTRAVENOUS
  Filled 2018-06-14: qty 200

## 2018-06-14 MED ORDER — PROSIGHT PO TABS
1.0000 | ORAL_TABLET | Freq: Two times a day (BID) | ORAL | Status: DC
  Administered 2018-06-15 – 2018-06-21 (×12): 1 via ORAL
  Filled 2018-06-14 (×13): qty 1

## 2018-06-14 MED ORDER — SODIUM CHLORIDE 0.9 % IV SOLN
INTRAVENOUS | Status: DC
  Administered 2018-06-15: 09:00:00 via INTRAVENOUS

## 2018-06-14 MED ORDER — ATORVASTATIN CALCIUM 20 MG PO TABS
20.0000 mg | ORAL_TABLET | Freq: Every day | ORAL | Status: DC
  Administered 2018-06-14 – 2018-06-21 (×8): 20 mg via ORAL
  Filled 2018-06-14 (×8): qty 1

## 2018-06-14 MED ORDER — ONDANSETRON HCL 4 MG PO TABS: 4.0000 mg | ORAL_TABLET | Freq: Four times a day (QID) | ORAL | Status: DC | PRN

## 2018-06-14 MED ORDER — BUSPIRONE HCL 5 MG PO TABS
15.0000 mg | ORAL_TABLET | Freq: Two times a day (BID) | ORAL | Status: DC
  Administered 2018-06-14 – 2018-06-21 (×14): 15 mg via ORAL
  Filled 2018-06-14: qty 1
  Filled 2018-06-14 (×2): qty 3
  Filled 2018-06-14 (×5): qty 1
  Filled 2018-06-14 (×2): qty 3
  Filled 2018-06-14 (×2): qty 1
  Filled 2018-06-14: qty 3
  Filled 2018-06-14: qty 1

## 2018-06-14 MED ORDER — PANTOPRAZOLE SODIUM 20 MG PO TBEC
20.0000 mg | DELAYED_RELEASE_TABLET | Freq: Every day | ORAL | Status: DC
  Administered 2018-06-15 – 2018-06-21 (×6): 20 mg via ORAL
  Filled 2018-06-14 (×8): qty 1

## 2018-06-14 MED ORDER — ONDANSETRON HCL 4 MG/2ML IJ SOLN
4.0000 mg | Freq: Four times a day (QID) | INTRAMUSCULAR | Status: DC | PRN
  Administered 2018-06-17 – 2018-06-21 (×7): 4 mg via INTRAVENOUS
  Filled 2018-06-14 (×8): qty 2

## 2018-06-14 MED ORDER — SODIUM CHLORIDE 0.9 % IV SOLN
1.0000 g | Freq: Two times a day (BID) | INTRAVENOUS | Status: DC
  Administered 2018-06-15: 1 g via INTRAVENOUS
  Filled 2018-06-14: qty 1

## 2018-06-14 MED ORDER — TORSEMIDE 20 MG PO TABS
20.0000 mg | ORAL_TABLET | Freq: Every day | ORAL | Status: DC | PRN
  Filled 2018-06-14: qty 1

## 2018-06-14 MED ORDER — IBUPROFEN 400 MG PO TABS
400.0000 mg | ORAL_TABLET | Freq: Once | ORAL | Status: AC
  Administered 2018-06-14: 400 mg via ORAL
  Filled 2018-06-14: qty 1

## 2018-06-14 MED ORDER — SODIUM CHLORIDE 0.9% FLUSH: 3.0000 mL | Freq: Once | INTRAVENOUS | Status: DC

## 2018-06-14 MED ORDER — WARFARIN - PHARMACIST DOSING INPATIENT
Freq: Every day | Status: DC
  Administered 2018-06-16: 1
  Administered 2018-06-20: 18:00:00

## 2018-06-14 MED ORDER — SODIUM CHLORIDE 0.9 % IV SOLN
2.0000 g | Freq: Once | INTRAVENOUS | Status: AC
  Administered 2018-06-14: 2 g via INTRAVENOUS
  Filled 2018-06-14: qty 2

## 2018-06-14 MED ORDER — ACETAMINOPHEN 325 MG PO TABS
650.0000 mg | ORAL_TABLET | Freq: Four times a day (QID) | ORAL | Status: DC | PRN
  Administered 2018-06-15 – 2018-06-20 (×4): 650 mg via ORAL
  Filled 2018-06-14 (×5): qty 2

## 2018-06-14 MED ORDER — SENNOSIDES-DOCUSATE SODIUM 8.6-50 MG PO TABS: 1.0000 | ORAL_TABLET | Freq: Every evening | ORAL | Status: DC | PRN

## 2018-06-14 MED ORDER — CYCLOSPORINE 0.05 % OP EMUL
1.0000 [drp] | Freq: Two times a day (BID) | OPHTHALMIC | Status: DC
  Administered 2018-06-14 – 2018-06-21 (×14): 1 [drp] via OPHTHALMIC
  Filled 2018-06-14 (×14): qty 30

## 2018-06-14 MED ORDER — LISINOPRIL 20 MG PO TABS
20.0000 mg | ORAL_TABLET | Freq: Two times a day (BID) | ORAL | Status: DC
  Administered 2018-06-14: 20 mg via ORAL
  Filled 2018-06-14: qty 1

## 2018-06-14 MED ORDER — ACETAMINOPHEN 325 MG PO TABS
650.0000 mg | ORAL_TABLET | Freq: Once | ORAL | Status: AC
  Administered 2018-06-14: 650 mg via ORAL
  Filled 2018-06-14: qty 2

## 2018-06-14 MED ORDER — OXYBUTYNIN CHLORIDE 5 MG PO TABS
5.0000 mg | ORAL_TABLET | Freq: Every day | ORAL | Status: DC
  Administered 2018-06-14 – 2018-06-21 (×8): 5 mg via ORAL
  Filled 2018-06-14 (×8): qty 1

## 2018-06-14 MED ORDER — HYDROCODONE-ACETAMINOPHEN 5-325 MG PO TABS
1.0000 | ORAL_TABLET | Freq: Four times a day (QID) | ORAL | Status: DC | PRN
  Administered 2018-06-14 – 2018-06-21 (×17): 1 via ORAL
  Filled 2018-06-14 (×19): qty 1

## 2018-06-14 NOTE — H&P (Signed)
Brittany [name] is an 85 y.o. female.   Chief Complaint: Fever, rigors, weakness, and fall. HPI: The patient is a 85 yr old woman who resides in an assisted living facility. She states that she has been having the above issues for the last 2-3 days. She actually denies dysuria, although her daughter who is present endorses frequency. The patient also has a chronic wound on the lateral aspect of her left foot that is being cared for by wound care. The daughter states that earlier this week there was a foul smell associated with the wound. She has also had a low blood pressure.  The patient has a past medical history significant for suspected cirrhosis, atrial fibrillation, chronic back pain, dyslipidemia, GERD, impaired hearing, hypertension, gastritis, history of bacterial endocarditis, pulmonary hypertension, and history of aortic valve replacement 2005.  Upon arrival at the ED the patient had a temperature of 102.3. respiratory rate of 22, pulse of 94. Blood pressure of 168/75. She was saturating 94% on room air.  The patient's sodium was 130. Potassium was 4.1. C)@ was 25, BUN was 18, creatinine was 0.78. LFT's were within normal limits. Lactic acid was 1.6. WBC was 19. Hemoglobin was 13.3. Hematocrit was 40.4. Platelets were 256. INR is 1.9. Urinalysis is pending as is urine culture and blood cultures x 2.  CXR was viewed and interpreted by me. It demonstrated bilateral pleural effusions and an enlarged cardiac silhouette suggestive of CHF. X-ray or the left foot demonstrated soft tissue ulceration with no radiographic evidence for acute osteomyelitis. X-ray of the lumbar spine demonstrated no acute fracture or dislocation of identified. It did demonstrate multilevel osteoarthritic changes.  The hospitalists have been consulted to admit the patient for further evaluation and treatment.  Past Medical History:  Diagnosis Date  . Abnormal liver diagnostic imaging    suspected cirrhosis  .  Arthritis   . Atrial fibrillation ([place])   . Chronic back pain   . Diverticulosis   . Dyslipidemia    takes Lipitor daily  . Dysrhythmia    Atrial Fibrillation  . Foot drop    SINCE 1987  . GERD (gastroesophageal reflux disease)   . H/O hiatal hernia   . Hearing impaired    Bilateral hearing aids  . History of bacterial endocarditis   . History of gastritis   . History of stomach ulcers    many yrs ago  . History of TIA (transient ischemic attack)    2013-- RESIDUAL PERIPHERAL VISION RIGHT EYE--  RESOLVED  . Hypertension   . IC (interstitial cystitis)   . Macular degeneration   . Pulmonary hypertension ([place]) 10/2013   Based on TTE  . S/P aortic valve replacement    2005  . Urine incontinence     Past Surgical History:  Procedure Laterality Date  . ABDOMINAL HYSTERECTOMY  1967  . AORTIC VALVE REPLACEMENT  09-29-2003    [facility] pericardial tissue valve  . APPENDECTOMY  1933  . BIOPSY  02/01/2018   Procedure: BIOPSY;  Surgeon: Rush Landmark Telford Nab., MD;  Location: [place];  Service: Gastroenterology;;  . CARDIAC CATHETERIZATION  07-15-2003 DR HELEN PRESTON   MODERATE TO MODERATELY SEVERE CALCIFIC AORTIC STENOSIS/  NORMAL CORONARY ARTERIES  . CARDIOVASCULAR STRESS TEST  01-30-2012  DR NASHER   NORMAL NUCLEAR STUDY/  EF 73%/  NORMAL LVF  . CARPAL TUNNEL RELEASE Bilateral   . CATARACT EXTRACTION W/ INTRAOCULAR LENS  IMPLANT, BILATERAL    . CHOLECYSTECTOMY  1993  .  CYSTO WITH HYDRODISTENSION N/A 02/05/2013   Procedure: CYSTOSCOPY/HYDRODISTENSION;  Surgeon: Irine Seal, MD;  Location: [facility];  Service: Urology;  Laterality: N/A;  . CYSTO/ HYDRODISTENTION/ INSTILLATION CLORPACTIN  MULTIPLE  last one 2009  . DILATION AND CURETTAGE OF UTERUS    . ERCP N/A 08/10/2012   Procedure: ENDOSCOPIC RETROGRADE CHOLANGIOPANCREATOGRAPHY (ERCP);  Surgeon: Inda Castle, MD;  Location: [place];  Service: Gastroenterology;  Laterality: N/A;  .  ESOPHAGOGASTRODUODENOSCOPY (EGD) WITH PROPOFOL N/A 08/18/2014   Procedure: ESOPHAGOGASTRODUODENOSCOPY (EGD) WITH PROPOFOL;  Surgeon: Inda Castle, MD;  Location: [facility];  Service: Endoscopy;  Laterality: N/A;  . ESOPHAGOGASTRODUODENOSCOPY (EGD) WITH PROPOFOL N/A 02/01/2018   Procedure: ESOPHAGOGASTRODUODENOSCOPY (EGD) WITH PROPOFOL;  Surgeon: Rush Landmark Telford Nab., MD;  Location: [place];  Service: Gastroenterology;  Laterality: N/A;  . FOREIGN BODY REMOVAL  02/01/2018   Procedure: FOREIGN BODY REMOVAL;  Surgeon: Rush Landmark Telford Nab., MD;  Location: [place];  Service: Gastroenterology;;  . LEFT HEART CATHETERIZATION WITH CORONARY ANGIOGRAM N/A 10/18/2013   Procedure: LEFT HEART CATHETERIZATION WITH CORONARY ANGIOGRAM;  Surgeon: Jettie Booze, MD;  Location: [facility] C[facility];  Service: Cardiovascular;  Laterality: N/A;  . LUMBAR [place] &  2007  . MACROPLASTIQUE URETHRAL IMPLANTATION  08-27-2009  . MINI-OPEN RIGHT ROTATOR CUFF REPAIR  07-12-2001  . RIGHT SHOULDER ARTHROSCOPY W/ DEBRIDEMENT ROTATOR CUFF AND LABRAL TEAR/ ACROMINOPLASTY/ DISTAL CLAVICLE EXCISION/ CA LIGAMENT RELEASE  10-08-1999  . TONSILLECTOMY AND ADENOIDECTOMY    . TRANSTHORACIC ECHOCARDIOGRAM  08-10-2011   MILD LVH/  EF 55-60%/  NORMAL AVR TISSUE/ MILD MR/  MODERATE DILATED LA/  MILD DILATED RA  . TRANSVAGINAL TAPE PROCEDURE  07-30-2002  . VEIN LIGATION  1969   RIGHT LOWER LEG    Family History  Problem Relation Age of Onset  . Heart disease Father   . Stomach cancer Maternal Grandmother   . Esophageal cancer Maternal Grandfather   . Bipolar disorder Son        Committed Suicide  . Coronary artery disease Brother   . Bipolar disorder Brother        commited suiside at 84yo  . Alcohol abuse Sister        sister #1  . Alcohol abuse Sister        sister #2  . Colon cancer Maternal Aunt   . Lung disease Neg Hx    Social History:  reports that she quit smoking about 28 years ago. Her  smoking use included cigarettes. She started smoking about 69 years ago. She has a 20.00 pack-year smoking history. She has never used smokeless tobacco. She reports that she does not drink alcohol or use drugs. Medications Prior to Admission  Medication Sig Dispense Refill  . acetaminophen (TYLENOL) 325 MG tablet Take 650 mg by mouth daily as needed for mild pain.    Marland Kitchen atorvastatin (LIPITOR) 20 MG tablet Take 20 mg by mouth daily.    . busPIRone (BUSPAR) 15 MG tablet Take 1 tablet (15 mg total) by mouth 2 (two) times daily. 180 tablet 2  . cycloSPORINE (RESTASIS) 0.05 % ophthalmic emulsion Place 1 drop into both eyes 2 (two) times daily.    Marland Kitchen diltiazem (CARDIZEM) 120 MG tablet Take 120 mg by mouth 2 (two) times daily.    Marland Kitchen HYDROcodone-acetaminophen (NORCO/VICODIN) 5-325 MG tablet Take 1 tablet by mouth every 6 (six) hours as needed for severe pain. 14 tablet 0  . lisinopril (PRINIVIL,ZESTRIL) 20 MG tablet Take 20 mg by mouth  2 (two) times daily.    . Multiple Vitamins-Minerals (ICAPS AREDS 2) CAPS Take 1 capsule by mouth 2 (two) times daily.    . ondansetron (ZOFRAN) 8 MG tablet Take 1 tablet (8 mg total) by mouth 2 (two) times daily. (Patient taking differently: Take 8 mg by mouth every 8 (eight) hours as needed for nausea or vomiting. ) 90 tablet 1  . oxybutynin (DITROPAN) 5 MG tablet Take 5 mg by mouth daily.    . pantoprazole (PROTONIX) 20 MG tablet Take 1 tablet (20 mg total) by mouth daily. 30 tablet 0  . Polyvinyl Alcohol-Povidone (REFRESH OP) Place 1 drop into both eyes 2 (two) times daily.    Marland Kitchen torsemide (DEMADEX) 20 MG tablet Take 20 mg by mouth daily as needed (retaining fluid).     . warfarin (COUMADIN) 5 MG tablet Take as directed by [facility] (Patient taking differently: Take 2.5-5 mg by mouth. Take 2.5 mg every Tues and Sat & Take 5 mg all other days ake as directed by [facility]) 30 tablet 2  . nitroGLYCERIN (NITROSTAT) 0.4 MG SL tablet Place 1 tablet (0.4 mg total) under  the tongue every 5 (five) minutes as needed for chest pain. 30 tablet 0    Allergies:  Allergies  Allergen Reactions  . Amlodipine Swelling and Other (See Comments)     Unspecified swelling reaction  . Amoxicillin Diarrhea, Nausea And Vomiting and Other (See Comments)    Has patient had a PCN reaction causing immediate rash, facial/tongue/throat swelling, SOB or lightheadedness with hypotension: No Has patient had a PCN reaction causing severe rash involving mucus membranes or skin necrosis: No Has patient had a PCN reaction that required hospitalization: No Has patient had a PCN reaction occurring within the last 10 years: No If all of the above answers are "NO", then may proceed with Cephalosporin use.  . Carafate [Sucralfate] Swelling and Other (See Comments)    Reaction:  Knee swelling/redness   . Cephalexin Diarrhea  . Codeine Nausea And Vomiting  . Irbesartan Swelling and Other (See Comments)    Reaction:  Facial/hand swelling and numbness   . Morphine And Related Other (See Comments)    Pt states that she has a history of addiction with this medication.      . Neurontin [Gabapentin] Swelling and Other (See Comments)    Reaction:  Leg swelling   . Nitrofurantoin Monohyd Macro Diarrhea and Nausea And Vomiting  . Prednisone Other (See Comments)    Reaction:  Elevated BP  . Sulfa Antibiotics Rash    Pertinent items are noted in HPI.   General appearance: alert, cooperative and slowed mentation Head: Normocephalic, without obvious abnormality, atraumatic Eyes: conjunctivae/corneas clear. PERRL, EOM's intact. Fundi benign. Throat: lips, mucosa, and tongue normal; teeth and gums normal Neck: no adenopathy, no carotid bruit, no JVD, supple, symmetrical, trachea midline and thyroid not enlarged, symmetric, no tenderness/mass/nodules Resp: there is no increased work of breathing. No wheezes, rales, or rhonchi. There are diminished breath sounds bilaterally.  No tactile  fremitus. Chest wall: no tenderness Cardio: regular rate and rhythm, S1, S2 normal, no murmur, click, rub or gallop GI: soft, non-tender; bowel sounds normal; no masses,  no organomegaly Extremities: extremities normal, atraumatic, no cyanosis or edema Pulses: 2+ and symmetric Skin: Skin color, texture, turgor normal. No rashes or lesions Lymph nodes: Cervical, supraclavicular, and axillary nodes normal. Neurologic: Grossly normal Incision/Wound: There is wound on the lateral aspect of the left foot. It is 1 cm  in diameter and appears to have some purulent drainage.   Results for orders placed or performed during the hospital encounter of 06/14/18 (from the past 48 hour(s))  Comprehensive metabolic panel     Status: Abnormal   Collection Time: 06/14/18  9:50 AM  Result Value Ref Range   Sodium 130 (L) 135 - 145 mmol/L   Potassium 4.1 3.5 - 5.1 mmol/L   Chloride 95 (L) 98 - 111 mmol/L   CO2 25 22 - 32 mmol/L   Glucose, Bld 91 70 - 99 mg/dL   BUN 18 8 - 23 mg/dL   Creatinine, Ser 0.78 0.44 - 1.00 mg/dL   Calcium 8.9 8.9 - 10.3 mg/dL   Total Protein 8.2 (H) 6.5 - 8.1 g/dL   Albumin 3.8 3.5 - 5.0 g/dL   AST 31 15 - 41 U/L   ALT 26 0 - 44 U/L   Alkaline Phosphatase 90 38 - 126 U/L   Total Bilirubin 1.2 0.3 - 1.2 mg/dL   GFR calc non Af Amer >60 >60 mL/min   GFR calc Af Amer >60 >60 mL/min   Anion gap 10 5 - 15    Comment: Performed at [facility], [place] [address], [place], [place] [address]  Lactic acid, plasma     Status: None   Collection Time: 06/14/18  9:50 AM  Result Value Ref Range   Lactic Acid, Venous 1.6 0.5 - 1.9 mmol/L    Comment: Performed at [facility], [place] [address], [place], [place] [address]  CBC with Differential     Status: Abnormal   Collection Time: 06/14/18  9:50 AM  Result Value Ref Range   WBC 19.0 (H) 4.0 - 10.5 K/uL   RBC 4.42 3.87 - 5.11 MIL/uL   Hemoglobin 13.3 12.0 - 15.0 g/dL   HCT 40.4 36.0 - 46.0 %   MCV  91.4 80.0 - 100.0 fL   MCH 30.1 26.0 - 34.0 pg   MCHC 32.9 30.0 - 36.0 g/dL   RDW 13.4 11.5 - 15.5 %   Platelets 256 150 - 400 K/uL   nRBC 0.0 0.0 - 0.2 %   Neutrophils Relative % 93 %   Neutro Abs 17.6 (H) 1.7 - 7.7 K/uL   Lymphocytes Relative 3 %   Lymphs Abs 0.6 (L) 0.7 - 4.0 K/uL   Monocytes Relative 3 %   Monocytes Absolute 0.5 0.1 - 1.0 K/uL   Eosinophils Relative 0 %   Eosinophils Absolute 0.1 0.0 - 0.5 K/uL   Basophils Relative 0 %   Basophils Absolute 0.0 0.0 - 0.1 K/uL   Immature Granulocytes 1 %   Abs Immature Granulocytes 0.18 (H) 0.00 - 0.07 K/uL    Comment: Performed at [facility], [place] [address], [place], [place] [address]  Protime-INR     Status: Abnormal   Collection Time: 06/14/18  9:50 AM  Result Value Ref Range   Prothrombin Time 21.9 (H) 11.4 - 15.2 seconds   INR 1.9 (H) 0.8 - 1.2    Comment: (NOTE) INR goal varies based on device and disease states. Performed at [facility], [place] [address] [place], [place] [address]   Influenza panel by PCR (type A & B)     Status: None   Collection Time: 06/14/18 10:08 AM  Result Value Ref Range   Influenza A By PCR NEGATIVE NEGATIVE   Influenza B By PCR NEGATIVE NEGATIVE    Comment: (NOTE) The Xpert Xpress  Flu assay is intended as an aid in the diagnosis of  influenza and should not be used as a sole basis for treatment.  This  assay is FDA approved for nasopharyngeal swab specimens only. Nasal  washings and aspirates are unacceptable for Xpert Xpress Flu testing. Performed at [facility], [place] [address], [place], [place] [address]   Urinalysis, Routine w reflex microscopic     Status: Abnormal   Collection Time: 06/14/18 11:17 AM  Result Value Ref Range   Color, Urine YELLOW YELLOW   APPearance CLEAR CLEAR   Specific Gravity, Urine 1.010 1.005 - 1.030   pH 7.0 5.0 - 8.0   Glucose, UA NEGATIVE NEGATIVE mg/dL   Hgb urine dipstick MODERATE (A)  NEGATIVE   Bilirubin Urine NEGATIVE NEGATIVE   Ketones, ur NEGATIVE NEGATIVE mg/dL   Protein, ur NEGATIVE NEGATIVE mg/dL   Nitrite NEGATIVE NEGATIVE   Leukocytes,Ua NEGATIVE NEGATIVE   RBC / HPF 11-20 0 - 5 RBC/hpf   WBC, UA 0-5 0 - 5 WBC/hpf   Bacteria, UA NONE SEEN NONE SEEN    Comment: Performed at [facility], [place] [address] [place], [place] [address]   @[email or web address] @  Blood pressure 120/70, pulse (!) 102, temperature 99.5 F (37.5 C), temperature source Oral, resp. rate 18, height 5\' 1"  (1.549 m), weight 47.6 kg, SpO2 99 %.    Assessment/Plan Problem  Sepsis Secondary to Uti ([facility])  Dementia ([facility])  Chronic Ulcer of Left Foot ([facility])  Diarrhea  Hyponatremia  Esophageal Reflux  Chronic atrial fibrillation ([facility])  Aortic Stenosis  Hypertension   The patient will be admitted to a medical bed. The source of the patient's sepsis is somewhat in question. Her UA is negative although urine culture is pending. I am concerned that the ulceration on the left aspect of her foot could also be a source. X-ray of the foot did not reveal abscess or osteomyelitis. Wound care will be consulted for this lesion. May consider CT or MRI to further investigate this wound. The patient will receive IV Vancomycin and cefepime for now. She will be continued on her home medications as possible. Blood cultures are also pending.  I have seen and examined this patient myself. I have spent 70 minute in her evaluation and admission. I have reviewed her medical record and I have viewed and interpreted her chest x-ray myself.  Anabella Capshaw 06/14/2018, 4:19 PM

## 2018-06-14 NOTE — ED Notes (Signed)
Bed: WA01 Expected date:  Expected time:  Means of arrival:  Comments: Fever

## 2018-06-14 NOTE — ED Notes (Signed)
Patient transported to X-ray 

## 2018-06-14 NOTE — ED Notes (Signed)
ED TO INPATIENT HANDOFF REPORT  ED Nurse Name and Phone #: Maddie RN  S Name/Age/Gender Brittany [name] 85 y.o. female Room/Bed: WA01/WA01  Code Status   Code Status: DNR  Home/SNF/Other Nursing Home {Patient oriented to: A&Ox4 Is this baseline? Yes   Triage Complete: Triage complete  Chief Complaint fever  Triage Note Per EMS: Ems called out for fall.  Pt was shaking, stating she was cold.  Pt's temp was 101 orally, and 103 tympanic.  Pt tachy in 120-130's.  Pt was here 1-2 weeks ago for a UTI.  Pt is irritable and daughter says she acts that was with a UTI.  Pt received 4mg  IV zofran, and 250 mL of NS.   Allergies Allergies  Allergen Reactions  . Amlodipine Swelling and Other (See Comments)     Unspecified swelling reaction  . Amoxicillin Diarrhea, Nausea And Vomiting and Other (See Comments)    Has patient had a PCN reaction causing immediate rash, facial/tongue/throat swelling, SOB or lightheadedness with hypotension: No Has patient had a PCN reaction causing severe rash involving mucus membranes or skin necrosis: No Has patient had a PCN reaction that required hospitalization: No Has patient had a PCN reaction occurring within the last 10 years: No If all of the above answers are "NO", then may proceed with Cephalosporin use.  . Carafate [Sucralfate] Swelling and Other (See Comments)    Reaction:  Knee swelling/redness   . Cephalexin Diarrhea  . Codeine Nausea And Vomiting  . Irbesartan Swelling and Other (See Comments)    Reaction:  Facial/hand swelling and numbness   . Morphine And Related Other (See Comments)    Pt states that she has a history of addiction with this medication.      . Neurontin [Gabapentin] Swelling and Other (See Comments)    Reaction:  Leg swelling   . Nitrofurantoin Monohyd Macro Diarrhea and Nausea And Vomiting  . Prednisone Other (See Comments)    Reaction:  Elevated BP  . Sulfa Antibiotics Rash    Level of Care/Admitting  Diagnosis ED Disposition    ED Disposition Condition Comment   Admit  Hospital Area: [place] [[id number]]  Level of Care: Med-Surg [16]  Diagnosis: Sepsis secondary to UTI [facility]) [[id number]]  Admitting Physician: Karie Kirks [phone]  Attending Physician: Benny Lennert, AVA [[id number]]  PT Class (Do Not Modify): Observation [104]  PT Acc Code (Do Not Modify): Observation [10022]       B Medical/Surgery History Past Medical History:  Diagnosis Date  . Abnormal liver diagnostic imaging    suspected cirrhosis  . Arthritis   . Atrial fibrillation ([place])   . Chronic back pain   . Diverticulosis   . Dyslipidemia    takes Lipitor daily  . Dysrhythmia    Atrial Fibrillation  . Foot drop    SINCE 1987  . GERD (gastroesophageal reflux disease)   . H/O hiatal hernia   . Hearing impaired    Bilateral hearing aids  . History of bacterial endocarditis   . History of gastritis   . History of stomach ulcers    many yrs ago  . History of TIA (transient ischemic attack)    2013-- RESIDUAL PERIPHERAL VISION RIGHT EYE--  RESOLVED  . Hypertension   . IC (interstitial cystitis)   . Macular degeneration   . Pulmonary hypertension ([place]) 10/2013   Based on TTE  . S/P aortic valve replacement    2005  . Urine incontinence    Past Surgical  History:  Procedure Laterality Date  . ABDOMINAL HYSTERECTOMY  1967  . AORTIC VALVE REPLACEMENT  09-29-2003    [facility] pericardial tissue valve  . APPENDECTOMY  1933  . BIOPSY  02/01/2018   Procedure: BIOPSY;  Surgeon: Rush Landmark Telford Nab., MD;  Location: [place];  Service: Gastroenterology;;  . CARDIAC CATHETERIZATION  07-15-2003 DR HELEN PRESTON   MODERATE TO MODERATELY SEVERE CALCIFIC AORTIC STENOSIS/  NORMAL CORONARY ARTERIES  . CARDIOVASCULAR STRESS TEST  01-30-2012  DR NASHER   NORMAL NUCLEAR STUDY/  EF 73%/  NORMAL LVF  . CARPAL TUNNEL RELEASE Bilateral   . CATARACT EXTRACTION W/ INTRAOCULAR LENS  IMPLANT, BILATERAL     . CHOLECYSTECTOMY  1993  . CYSTO WITH HYDRODISTENSION N/A 02/05/2013   Procedure: CYSTOSCOPY/HYDRODISTENSION;  Surgeon: Irine Seal, MD;  Location: [facility];  Service: Urology;  Laterality: N/A;  . CYSTO/ HYDRODISTENTION/ INSTILLATION CLORPACTIN  MULTIPLE  last one 2009  . DILATION AND CURETTAGE OF UTERUS    . ERCP N/A 08/10/2012   Procedure: ENDOSCOPIC RETROGRADE CHOLANGIOPANCREATOGRAPHY (ERCP);  Surgeon: Inda Castle, MD;  Location: [place];  Service: Gastroenterology;  Laterality: N/A;  . ESOPHAGOGASTRODUODENOSCOPY (EGD) WITH PROPOFOL N/A 08/18/2014   Procedure: ESOPHAGOGASTRODUODENOSCOPY (EGD) WITH PROPOFOL;  Surgeon: Inda Castle, MD;  Location: [facility];  Service: Endoscopy;  Laterality: N/A;  . ESOPHAGOGASTRODUODENOSCOPY (EGD) WITH PROPOFOL N/A 02/01/2018   Procedure: ESOPHAGOGASTRODUODENOSCOPY (EGD) WITH PROPOFOL;  Surgeon: Rush Landmark Telford Nab., MD;  Location: [place];  Service: Gastroenterology;  Laterality: N/A;  . FOREIGN BODY REMOVAL  02/01/2018   Procedure: FOREIGN BODY REMOVAL;  Surgeon: Rush Landmark Telford Nab., MD;  Location: [place];  Service: Gastroenterology;;  . LEFT HEART CATHETERIZATION WITH CORONARY ANGIOGRAM N/A 10/18/2013   Procedure: LEFT HEART CATHETERIZATION WITH CORONARY ANGIOGRAM;  Surgeon: Jettie Booze, MD;  Location: [facility] CATH LAB;  Service: Cardiovascular;  Laterality: N/A;  . LUMBAR [place] &  2007  . MACROPLASTIQUE URETHRAL IMPLANTATION  08-27-2009  . MINI-OPEN RIGHT ROTATOR CUFF REPAIR  07-12-2001  . RIGHT SHOULDER ARTHROSCOPY W/ DEBRIDEMENT ROTATOR CUFF AND LABRAL TEAR/ ACROMINOPLASTY/ DISTAL CLAVICLE EXCISION/ CA LIGAMENT RELEASE  10-08-1999  . TONSILLECTOMY AND ADENOIDECTOMY    . TRANSTHORACIC ECHOCARDIOGRAM  08-10-2011   MILD LVH/  EF 55-60%/  NORMAL AVR TISSUE/ MILD MR/  MODERATE DILATED LA/  MILD DILATED RA  . TRANSVAGINAL TAPE PROCEDURE  07-30-2002  . [place]   RIGHT LOWER LEG     A IV  Location/Drains/Wounds Patient Lines/Drains/Airways Status   Active Line/Drains/Airways    Name:   Placement date:   Placement time:   Site:   Days:   Peripheral IV 06/14/18 Left Antecubital   06/14/18    -    Antecubital   less than 1   Peripheral IV 06/14/18 Right Antecubital   06/14/18    0959    Antecubital   less than 1   External Urinary Catheter   05/04/18    2008    -   41   Pressure Injury 05/05/18 Unstageable - Full thickness tissue loss in which the base of the ulcer is covered by slough (yellow, tan, gray, green or brown) and/or eschar (tan, brown or black) in the wound bed.   05/05/18    0221     40   Wound / Incision (Open or Dehisced) 05/10/18 Other (Comment) Arm Left;Lower;Posterior patient hit arm on the chair arm and caused a skin tear, vasaline gauze, 4x4 & gauze  applied   05/10/18    0830    Arm   35          Intake/Output Last 24 hours  Intake/Output Summary (Last 24 hours) at 06/14/2018 1446 Last data filed at 06/14/2018 1253 Gross per 24 hour  Intake 100 ml  Output -  Net 100 ml    Labs/Imaging Results for orders placed or performed during the hospital encounter of 06/14/18 (from the past 48 hour(s))  Comprehensive metabolic panel     Status: Abnormal   Collection Time: 06/14/18  9:50 AM  Result Value Ref Range   Sodium 130 (L) 135 - 145 mmol/L   Potassium 4.1 3.5 - 5.1 mmol/L   Chloride 95 (L) 98 - 111 mmol/L   CO2 25 22 - 32 mmol/L   Glucose, Bld 91 70 - 99 mg/dL   BUN 18 8 - 23 mg/dL   Creatinine, Ser 0.78 0.44 - 1.00 mg/dL   Calcium 8.9 8.9 - 10.3 mg/dL   Total Protein 8.2 (H) 6.5 - 8.1 g/dL   Albumin 3.8 3.5 - 5.0 g/dL   AST 31 15 - 41 U/L   ALT 26 0 - 44 U/L   Alkaline Phosphatase 90 38 - 126 U/L   Total Bilirubin 1.2 0.3 - 1.2 mg/dL   GFR calc non Af Amer >60 >60 mL/min   GFR calc Af Amer >60 >60 mL/min   Anion gap 10 5 - 15    Comment: Performed at [facility], [place] [address] [place], [place] [address]  Lactic acid, plasma      Status: None   Collection Time: 06/14/18  9:50 AM  Result Value Ref Range   Lactic Acid, Venous 1.6 0.5 - 1.9 mmol/L    Comment: Performed at [facility], [place] [address], [place], [place] [address]  CBC with Differential     Status: Abnormal   Collection Time: 06/14/18  9:50 AM  Result Value Ref Range   WBC 19.0 (H) 4.0 - 10.5 K/uL   RBC 4.42 3.87 - 5.11 MIL/uL   Hemoglobin 13.3 12.0 - 15.0 g/dL   HCT 40.4 36.0 - 46.0 %   MCV 91.4 80.0 - 100.0 fL   MCH 30.1 26.0 - 34.0 pg   MCHC 32.9 30.0 - 36.0 g/dL   RDW 13.4 11.5 - 15.5 %   Platelets 256 150 - 400 K/uL   nRBC 0.0 0.0 - 0.2 %   Neutrophils Relative % 93 %   Neutro Abs 17.6 (H) 1.7 - 7.7 K/uL   Lymphocytes Relative 3 %   Lymphs Abs 0.6 (L) 0.7 - 4.0 K/uL   Monocytes Relative 3 %   Monocytes Absolute 0.5 0.1 - 1.0 K/uL   Eosinophils Relative 0 %   Eosinophils Absolute 0.1 0.0 - 0.5 K/uL   Basophils Relative 0 %   Basophils Absolute 0.0 0.0 - 0.1 K/uL   Immature Granulocytes 1 %   Abs Immature Granulocytes 0.18 (H) 0.00 - 0.07 K/uL    Comment: Performed at [facility], [place] [address] [place], [place] [address]  Protime-INR     Status: Abnormal   Collection Time: 06/14/18  9:50 AM  Result Value Ref Range   Prothrombin Time 21.9 (H) 11.4 - 15.2 seconds   INR 1.9 (H) 0.8 - 1.2    Comment: (NOTE) INR goal varies based on device and disease states. Performed at [facility], [place] [address] [place], [place] [address]  Influenza panel by PCR (type A & B)     Status: None   Collection Time: 06/14/18 10:08 AM  Result Value Ref Range   Influenza A By PCR NEGATIVE NEGATIVE   Influenza B By PCR NEGATIVE NEGATIVE    Comment: (NOTE) The Xpert Xpress Flu assay is intended as an aid in the diagnosis of  influenza and should not be used as a sole basis for treatment.  This  assay is FDA approved for nasopharyngeal swab specimens only. Nasal  washings and aspirates are  unacceptable for Xpert Xpress Flu testing. Performed at [facility], [place] [address] [place], [place] [address]   Urinalysis, Routine w reflex microscopic     Status: Abnormal   Collection Time: 06/14/18 11:17 AM  Result Value Ref Range   Color, Urine YELLOW YELLOW   APPearance CLEAR CLEAR   Specific Gravity, Urine 1.010 1.005 - 1.030   pH 7.0 5.0 - 8.0   Glucose, UA NEGATIVE NEGATIVE mg/dL   Hgb urine dipstick MODERATE (A) NEGATIVE   Bilirubin Urine NEGATIVE NEGATIVE   Ketones, ur NEGATIVE NEGATIVE mg/dL   Protein, ur NEGATIVE NEGATIVE mg/dL   Nitrite NEGATIVE NEGATIVE   Leukocytes,Ua NEGATIVE NEGATIVE   RBC / HPF 11-20 0 - 5 RBC/hpf   WBC, UA 0-5 0 - 5 WBC/hpf   Bacteria, UA NONE SEEN NONE SEEN    Comment: Performed at [facility], [place] [address] [place], [place] [address]   Dg Chest 2 View  Result Date: 06/14/2018 CLINICAL DATA:  Cough for 3 days COMPARISON:  Radiograph 05/08/2018 FINDINGS: Sternotomy wires overlie enlarged cardiac silhouette. Small pleural effusion seen on lateral projection. Upper lungs are clear. No focal consolidation. Atherosclerotic calcification of the aorta. IMPRESSION: Cardiomegaly and bilateral effusions. Findings suggest congestive heart failure Electronically Signed   By: Suzy Bouchard M.D.   On: 06/14/2018 10:39   Dg Lumbar Spine Complete  Result Date: 06/14/2018 CLINICAL DATA:  Post unwitnessed fall with low back pain. EXAM: LUMBAR SPINE - COMPLETE 4+ VIEW COMPARISON:  None. FINDINGS: There is no evidence of lumbar spine fracture. Alignment is normal. Multilevel osteoarthritic changes with disc space narrowing, subchondral sclerosis, osteophyte formation. Calcific atherosclerotic disease of the aorta. IMPRESSION: 1. No acute fracture or dislocation identified about the lumbosacral spine. 2. Multilevel osteoarthritic changes, moderate. Electronically Signed   By: Fidela Salisbury M.D.   On: 06/14/2018 13:32    Dg Foot 2 Views Left  Result Date: 06/14/2018 CLINICAL DATA:  Initial evaluation for nonhealing wound at lateral left foot. Recent trauma with fall. EXAM: LEFT FOOT - 2 VIEW COMPARISON:  Prior radiograph from 01/01/2012 FINDINGS: No acute fracture or dislocation. Fixation screws in place at the left third and fourth digits without complication. Scattered osteoarthritic changes present about the foot with underlying osteopenia. Soft tissue swelling overlies the lateral aspect of the base of the left fifth metatarsal, likely related to overlying ulcer/wound. No evidence for acute osteomyelitis. No dissecting soft tissue emphysema or other worrisome feature. Vascular calcifications noted within the lower leg. IMPRESSION: 1. Soft tissue ulceration/wound overlying the base of the left fifth metatarsal. No radiographic evidence for acute osteomyelitis. 2. No other acute osseous abnormality about the left foot. 3. Postoperative changes at the left third and fourth digits without adverse features. 4. Advanced osteopenia. Electronically Signed   By: Jeannine Boga M.D.   On: 06/14/2018 13:39    Pending Labs [employer] (From admission, onward)    Start  Ordered   06/21/18 0500  Creatinine, serum  (enoxaparin (LOVENOX)    CrCl >/= 30 ml/min)  Weekly,   R    Comments:  while on enoxaparin therapy    06/14/18 1150   06/15/18 7062  Basic metabolic panel  Tomorrow morning,   R     06/14/18 1150   06/15/18 0500  CBC  Tomorrow morning,   R     06/14/18 1150   06/15/18 0500  Protime-INR  Daily,   R     06/14/18 1225   06/14/18 1150  Culture, Urine  Add-on,   R     06/14/18 1150   06/14/18 1144  CBC  (enoxaparin (LOVENOX)    CrCl >/= 30 ml/min)  Once,   R    Comments:  Baseline for enoxaparin therapy IF NOT ALREADY DRAWN.  Notify MD if PLT < 100 K.    06/14/18 1150   06/14/18 1144  Creatinine, serum  (enoxaparin (LOVENOX)    CrCl >/= 30 ml/min)  Once,   R    Comments:  Baseline for enoxaparin  therapy IF NOT ALREADY DRAWN.    06/14/18 1150   06/14/18 0950  Lactic acid, plasma  Now then every 2 hours,   STAT     06/14/18 0950   06/14/18 0950  Culture, blood (Routine x 2)  BLOOD CULTURE X 2,   STAT     06/14/18 0950          Vitals/Pain Today's Vitals   06/14/18 0944 06/14/18 0945 06/14/18 0947 06/14/18 1358  BP:  (!) 168/75  (!) 102/52  Pulse:  94    Resp:  (!) 22  16  Temp:  (!) 102.3 F (39.1 C)    TempSrc:  Oral    SpO2: 94%     Weight:   47.6 kg   Height:   5\' 1"  (1.549 m)     Isolation Precautions No active isolations  Medications Medications  sodium chloride flush (NS) 0.9 % injection 3 mL (has no administration in time range)  enoxaparin (LOVENOX) injection 40 mg (has no administration in time range)  0.9 %  sodium chloride infusion (has no administration in time range)  acetaminophen (TYLENOL) tablet 650 mg (has no administration in time range)    Or  acetaminophen (TYLENOL) suppository 650 mg (has no administration in time range)  senna-docusate (Senokot-S) tablet 1 tablet (has no administration in time range)  sorbitol 70 % solution 30 mL (has no administration in time range)  ondansetron (ZOFRAN) tablet 4 mg (has no administration in time range)    Or  ondansetron (ZOFRAN) injection 4 mg (has no administration in time range)  ceFEPIme (MAXIPIME) 1 g in sodium chloride 0.9 % 100 mL IVPB (has no administration in time range)  Warfarin - Pharmacist Dosing Inpatient (has no administration in time range)  warfarin (COUMADIN) tablet 6 mg (has no administration in time range)  acetaminophen (TYLENOL) tablet 650 mg (650 mg Oral Given 06/14/18 1002)  ceFEPIme (MAXIPIME) 2 g in sodium chloride 0.9 % 100 mL IVPB (0 g Intravenous Stopped 06/14/18 1253)  vancomycin (VANCOCIN) IVPB 1000 mg/200 mL premix (1,000 mg Intravenous New Bag/Given 06/14/18 1252)    Mobility walks with device     Focused Assessments Pulmonary Assessment Handoff:  Lung sounds:             R Recommendations: See Admitting Provider Note  Report given to:   Additional Notes: sepsis

## 2018-06-14 NOTE — Progress Notes (Signed)
VS: HR: 125,  T: 102.1.  K. Schorr, NP notified.

## 2018-06-14 NOTE — ED Triage Notes (Signed)
Per EMS: Ems called out for fall.  Pt was shaking, stating she was cold.  Pt's temp was 101 orally, and 103 tympanic.  Pt tachy in 120-130's.  Pt was here 1-2 weeks ago for a UTI.  Pt is irritable and daughter says she acts that was with a UTI.  Pt received 4mg  IV zofran, and 250 mL of NS.

## 2018-06-14 NOTE — ED Notes (Signed)
Pt given graham crackers and applesauce to eat and water until pt receives lunch tray.

## 2018-06-14 NOTE — Progress Notes (Addendum)
ANTICOAGULATION CONSULT NOTE - Initial Consult  Pharmacy Consult for warfarin Indication: s/p AVR  Allergies  Allergen Reactions  . Amlodipine Swelling and Other (See Comments)     Unspecified swelling reaction  . Amoxicillin Diarrhea, Nausea And Vomiting and Other (See Comments)    Has patient had a PCN reaction causing immediate rash, facial/tongue/throat swelling, SOB or lightheadedness with hypotension: No Has patient had a PCN reaction causing severe rash involving mucus membranes or skin necrosis: No Has patient had a PCN reaction that required hospitalization: No Has patient had a PCN reaction occurring within the last 10 years: No If all of the above answers are "NO", then may proceed with Cephalosporin use.  . Carafate [Sucralfate] Swelling and Other (See Comments)    Reaction:  Knee swelling/redness   . Cephalexin Diarrhea  . Codeine Nausea And Vomiting  . Irbesartan Swelling and Other (See Comments)    Reaction:  Facial/hand swelling and numbness   . Morphine And Related Other (See Comments)    Pt states that she has a history of addiction with this medication.      . Neurontin [Gabapentin] Swelling and Other (See Comments)    Reaction:  Leg swelling   . Nitrofurantoin Monohyd Macro Diarrhea and Nausea And Vomiting  . Prednisone Other (See Comments)    Reaction:  Elevated BP  . Sulfa Antibiotics Rash    Patient Measurements: Height: 5\' 1"  (154.9 cm) Weight: 105 lb (47.6 kg) IBW/kg (Calculated) : 47.8  Vital Signs: Temp: 102.3 F (39.1 C) (03/05 0945) Temp Source: Oral (03/05 0945) BP: 168/75 (03/05 0945)  Labs: Recent Labs    06/14/18 0950  HGB 13.3  HCT 40.4  PLT 256  LABPROT 21.9*  INR 1.9*  CREATININE 0.78    Estimated Creatinine Clearance: 35.8 mL/min (by C-G formula based on SCr of 0.78 mg/dL).   Medical History: Past Medical History:  Diagnosis Date  . Abnormal liver diagnostic imaging    suspected cirrhosis  . Arthritis   . Atrial  fibrillation ([place])   . Chronic back pain   . Diverticulosis   . Dyslipidemia    takes Lipitor daily  . Dysrhythmia    Atrial Fibrillation  . Foot drop    SINCE 1987  . GERD (gastroesophageal reflux disease)   . H/O hiatal hernia   . Hearing impaired    Bilateral hearing aids  . History of bacterial endocarditis   . History of gastritis   . History of stomach ulcers    many yrs ago  . History of TIA (transient ischemic attack)    2013-- RESIDUAL PERIPHERAL VISION RIGHT EYE--  RESOLVED  . Hypertension   . IC (interstitial cystitis)   . Macular degeneration   . Pulmonary hypertension ([place]) 10/2013   Based on TTE  . S/P aortic valve replacement    2005  . Urine incontinence     Medications:  Scheduled:  . enoxaparin (LOVENOX) injection  40 mg Subcutaneous Q24H  . sodium chloride flush  3 mL Intravenous Once   Infusions:  . sodium chloride    . ceFEPime (MAXIPIME) IV    . vancomycin      Assessment: 85 yo female presented to ER after fall with fever and tachycardia. To continue warfarin per patient's home meds for hx AVR. Patient reportedly takes warfarin 5mg  daily except 2.5mg  on Tues/Sat with last dose per PTA med list 3/4 at 0930. INR just below goal of 2-3.   Goal of Therapy:  INR 2-3  Plan:  1) Warfarin 6mg  today at 1800, just above usual Thursday dose of 5mg  per above due to slightly subtherapeutic INR 2) Daily INR 3) Currently on Lovenox 40mg  qhs - discontinue when INR > 2   Adrian Saran, PharmD, BCPS Pager [phone] 06/14/2018 12:22 PM

## 2018-06-14 NOTE — ED Provider Notes (Signed)
[facility] DEPT Provider Note   CSN: [id number] Arrival date & time: 06/14/18  0737    History   Chief Complaint No chief complaint on file.   HPI Brittany [name] is a 85 y.o. female.     HPI   85 year old female brought in by EMS after fall.  Patient is unable to tell me exactly how/why she fell.  Daughter at bedside states she seemed little bit irritable which is typical for when she has a urinary tract infection.  Patient is complaining of having chills.  She is noted to be febrile prehospital.  No vomiting or diarrhea.  She does feel nauseated though.  No cough.  Past Medical History:  Diagnosis Date  . Abnormal liver diagnostic imaging    suspected cirrhosis  . Arthritis   . Atrial fibrillation ([place])   . Chronic back pain   . Diverticulosis   . Dyslipidemia    takes Lipitor daily  . Dysrhythmia    Atrial Fibrillation  . Foot drop    SINCE 1987  . GERD (gastroesophageal reflux disease)   . H/O hiatal hernia   . Hearing impaired    Bilateral hearing aids  . History of bacterial endocarditis   . History of gastritis   . History of stomach ulcers    many yrs ago  . History of TIA (transient ischemic attack)    2013-- RESIDUAL PERIPHERAL VISION RIGHT EYE--  RESOLVED  . Hypertension   . IC (interstitial cystitis)   . Macular degeneration   . Pulmonary hypertension ([place]) 10/2013   Based on TTE  . S/P aortic valve replacement    2005  . Urine incontinence     Patient Active Problem List   Diagnosis Date Noted  . Acute metabolic encephalopathy   . Acute lower UTI   . Confusion 05/04/2018  . Dementia ([place]) 05/04/2018  . Chronic ulcer of left foot ([place]) 05/04/2018  . CKD (chronic kidney disease), stage III ([place]) 01/30/2018  . H/O hiatal hernia   . Allergic rhinitis 06/06/2017  . Mediastinal lymphadenopathy 06/06/2017  . Encounter for therapeutic drug monitoring 10/04/2016  . Colitis 09/29/2016  . Abdominal pain 09/29/2016  .  Diarrhea 09/29/2016  . Chronic back pain 09/29/2016  . Dyspepsia 04/14/2016  . Cough 12/02/2014  . Emphysema lung ([place]) 12/02/2014  . Oral thrush 12/02/2014  . Multiple lung nodules on CT 10/07/2014  . Nausea without vomiting 09/30/2014  . Hyponatremia 08/27/2014  . Nausea & vomiting 08/27/2014  . Anemia 02/06/2014  . Perioral numbness 10/19/2013  . Headache(784.0) 10/12/2013  . Chronic diastolic heart failure ([place]) 10/12/2013  . Atelectasis 10/12/2013  . Pulmonary hypertension ([place]) 10/12/2013  . Cephalalgia   . Dizziness 10/11/2013  . Headache 10/11/2013  . S/P AVR (aortic valve replacement) 11/19/2012  . Abdominal pain, periumbilic 10/62/6948  . 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 Calculus of bile duct without mention of cholecystitis or obstruction 08/10/2012  . Chronic gastritis 07/16/2012  . Nausea alone 05/21/2012  . Abdominal pain, epigastric 05/21/2012  . Cellulitis of foot 01/01/2012  . Dysphagia, unspecified(787.20) 12/26/2011  . Esophageal reflux 12/26/2011  . Dyspnea on exertion 11/18/2011  . Chronic atrial fibrillation ([place]) 11/07/2011  . Chest pain 06/06/2011  . Aortic stenosis 03/24/2011  . Hypertension 03/24/2011  . H/O aortic valve replacement 10/02/2003    Past Surgical History:  Procedure Laterality Date  . ABDOMINAL HYSTERECTOMY  1967  . AORTIC VALVE REPLACEMENT  09-29-2003    [facility] pericardial  tissue valve  . APPENDECTOMY  1933  . BIOPSY  02/01/2018   Procedure: BIOPSY;  Surgeon: Rush Landmark Telford Nab., MD;  Location: [facility];  Service: Gastroenterology;;  . CARDIAC CATHETERIZATION  07-15-2003 DR HELEN PRESTON   MODERATE TO MODERATELY SEVERE CALCIFIC AORTIC STENOSIS/  NORMAL CORONARY ARTERIES  . CARDIOVASCULAR STRESS TEST  01-30-2012  DR NASHER   NORMAL NUCLEAR STUDY/  EF 73%/  NORMAL LVF  . CARPAL TUNNEL RELEASE Bilateral   . CATARACT EXTRACTION W/ INTRAOCULAR LENS  IMPLANT, BILATERAL    . CHOLECYSTECTOMY  1993  . CYSTO  WITH HYDRODISTENSION N/A 02/05/2013   Procedure: CYSTOSCOPY/HYDRODISTENSION;  Surgeon: Irine Seal, MD;  Location: [facility];  Service: Urology;  Laterality: N/A;  . CYSTO/ HYDRODISTENTION/ INSTILLATION CLORPACTIN  MULTIPLE  last one 2009  . DILATION AND CURETTAGE OF UTERUS    . ERCP N/A 08/10/2012   Procedure: ENDOSCOPIC RETROGRADE CHOLANGIOPANCREATOGRAPHY (ERCP);  Surgeon: Inda Castle, MD;  Location: [facility];  Service: Gastroenterology;  Laterality: N/A;  . ESOPHAGOGASTRODUODENOSCOPY (EGD) WITH PROPOFOL N/A 08/18/2014   Procedure: ESOPHAGOGASTRODUODENOSCOPY (EGD) WITH PROPOFOL;  Surgeon: Inda Castle, MD;  Location: [facility];  Service: Endoscopy;  Laterality: N/A;  . ESOPHAGOGASTRODUODENOSCOPY (EGD) WITH PROPOFOL N/A 02/01/2018   Procedure: ESOPHAGOGASTRODUODENOSCOPY (EGD) WITH PROPOFOL;  Surgeon: Rush Landmark Telford Nab., MD;  Location: [facility];  Service: Gastroenterology;  Laterality: N/A;  . FOREIGN BODY REMOVAL  02/01/2018   Procedure: FOREIGN BODY REMOVAL;  Surgeon: Rush Landmark Telford Nab., MD;  Location: [facility];  Service: Gastroenterology;;  . LEFT HEART CATHETERIZATION WITH CORONARY ANGIOGRAM N/A 10/18/2013   Procedure: LEFT HEART CATHETERIZATION WITH CORONARY ANGIOGRAM;  Surgeon: Jettie Booze, MD;  Location: [facility] C[facility];  Service: Cardiovascular;  Laterality: N/A;  . LUMBAR [place] &  2007  . MACROPLASTIQUE URETHRAL IMPLANTATION  08-27-2009  . MINI-OPEN RIGHT ROTATOR CUFF REPAIR  07-12-2001  . RIGHT SHOULDER ARTHROSCOPY W/ DEBRIDEMENT ROTATOR CUFF AND LABRAL TEAR/ ACROMINOPLASTY/ DISTAL CLAVICLE EXCISION/ CA LIGAMENT RELEASE  10-08-1999  . TONSILLECTOMY AND ADENOIDECTOMY    . TRANSTHORACIC ECHOCARDIOGRAM  08-10-2011   MILD LVH/  EF 55-60%/  NORMAL AVR TISSUE/ MILD MR/  MODERATE DILATED LA/  MILD DILATED RA  . TRANSVAGINAL TAPE PROCEDURE  07-30-2002  . VEIN LIGATION  1969   RIGHT LOWER LEG     OB History   No obstetric history on  file.      Home Medications    Prior to Admission medications   Medication Sig Start Date End Date Taking? Authorizing Provider  atorvastatin (LIPITOR) 20 MG tablet Take 20 mg by mouth daily.    [provider]  augmented betamethasone dipropionate (DIPROLENE-AF) 0.05 % cream Apply 1 application topically daily.  04/18/18   [provider]  busPIRone (BUSPAR) 15 MG tablet Take 1 tablet (15 mg total) by mouth 2 (two) times daily. 03/16/18   Armbruster, Carlota Raspberry, MD  cycloSPORINE (RESTASIS) 0.05 % ophthalmic emulsion Place 1 drop into both eyes 2 (two) times daily.    [provider]  diazepam (VALIUM) 5 MG tablet Take 5 mg by mouth at bedtime.     [provider]  diltiazem (CARDIZEM) 120 MG tablet Take 120 mg by mouth 2 (two) times daily.    [provider]  famotidine (PEPCID) 40 MG tablet Take 40 mg by mouth daily.    [provider]  HYDROcodone-acetaminophen (NORCO/VICODIN) 5-325 MG tablet Take 1 tablet by mouth every 6 (six) hours as needed for severe  pain. 02/02/18   Aline August, MD  lisinopril (PRINIVIL,ZESTRIL) 40 MG tablet Take 40 mg by mouth daily.     [provider]  Multiple Vitamins-Minerals (ICAPS AREDS 2) CAPS Take 1 capsule by mouth 2 (two) times daily.    [provider]  nitroGLYCERIN (NITROSTAT) 0.4 MG SL tablet Place 1 tablet (0.4 mg total) under the tongue every 5 (five) minutes as needed for chest pain. 02/02/18   Aline August, MD  ondansetron (ZOFRAN) 8 MG tablet Take 1 tablet (8 mg total) by mouth 2 (two) times daily. Patient taking differently: Take 8 mg by mouth every 8 (eight) hours as needed for nausea or vomiting.  02/22/18   Armbruster, Carlota Raspberry, MD  pantoprazole (PROTONIX) 20 MG tablet Take 1 tablet (20 mg total) by mouth daily. 04/19/18   Shamleffer, Melanie Crazier, MD  Polyvinyl Alcohol-Povidone (REFRESH OP) Place 1 drop into both eyes 2 (two) times daily.    [provider]    potassium chloride SA (K-DUR,KLOR-CON) 20 MEQ tablet Take 20 mEq by mouth daily as needed (ONLY WHEN TAKING TORSEMIDE).    [provider]  torsemide (DEMADEX) 20 MG tablet Take 20 mg by mouth daily as needed (retaining fluid).     [provider]  warfarin (COUMADIN) 5 MG tablet Take as directed by [facility] Patient taking differently: Take 2.5-5 mg by mouth. Take 2.5 mg every Tues and Sat & Take 5 mg all other days ake as directed by [facility] 04/12/18   Nahser, Wonda Cheng, MD    Family History Family History  Problem Relation Age of Onset  . Heart disease Father   . Stomach cancer Maternal Grandmother   . Esophageal cancer Maternal Grandfather   . Bipolar disorder Son        Committed Suicide  . Coronary artery disease Brother   . Bipolar disorder Brother        commited suiside at 87yo  . Alcohol abuse Sister        sister #1  . Alcohol abuse Sister        sister #2  . Colon cancer Maternal Aunt   . Lung disease Neg Hx     Social History Social History   Tobacco Use  . Smoking status: Former Smoker    Packs/day: 0.50    Years: 40.00    Pack years: 20.00    Types: Cigarettes    Start date: 05/06/1949    Last attempt to quit: 03/06/1990    Years since quitting: 28.2  . Smokeless tobacco: Never Used  Substance Use Topics  . Alcohol use: No    Alcohol/week: 0.0 standard drinks    Comment: Remote social EtOH  . Drug use: No     Allergies   Amlodipine; Amoxicillin; Carafate [sucralfate]; Cephalexin; Codeine; Irbesartan; Morphine and related; Neurontin [gabapentin]; Nitrofurantoin monohyd macro; Prednisone; and Sulfa antibiotics   Review of Systems Review of Systems  All systems reviewed and negative, other than as noted in HPI.  Physical Exam Updated Vital Signs BP (!) 168/75   Temp (!) 102.3 F (39.1 C) (Oral)   Resp (!) 22   Ht 5\' 1"  (1.549 m)   Wt 47.6 kg   SpO2 94% Comment: etCO2: 24  BMI 19.84 kg/m   Physical  Exam Vitals signs and nursing note reviewed.  Constitutional:      General: She is not in acute distress.    Appearance: She is well-developed.  HENT:     Head:  Normocephalic and atraumatic.  Eyes:     General:        Right eye: No discharge.        Left eye: No discharge.     Conjunctiva/sclera: Conjunctivae normal.  Neck:     Musculoskeletal: Neck supple.  Cardiovascular:     Rate and Rhythm: Normal rate and regular rhythm.     Heart sounds: Normal heart sounds. No murmur. No friction rub. No gallop.   Pulmonary:     Effort: Pulmonary effort is normal. No respiratory distress.     Breath sounds: Normal breath sounds.  Abdominal:     General: There is no distension.     Palpations: Abdomen is soft.     Tenderness: There is no abdominal tenderness.  Musculoskeletal:        General: No tenderness.  Skin:    General: Skin is warm and dry.  Neurological:     Mental Status: She is alert.  Psychiatric:        Behavior: Behavior normal.        Thought Content: Thought content normal.      ED Treatments / Results  Labs (all labs ordered are listed, but only abnormal results are displayed) Labs Reviewed  CULTURE, BLOOD (ROUTINE X 2) - Abnormal; Notable for the following components:      Result Value   Culture STREPTOCOCCUS GROUP G (*)    All other components within normal limits  URINE CULTURE - Abnormal; Notable for the following components:   Culture   (*)    Value: <10,000 COLONIES/mL INSIGNIFICANT GROWTH Performed at [facility] [address] [place], [place] [address]    All other components within normal limits  BLOOD CULTURE ID PANEL (REFLEXED) - Abnormal; Notable for the following components:   Streptococcus species DETECTED (*)    All other components within normal limits  COMPREHENSIVE METABOLIC PANEL - Abnormal; Notable for the following components:   Sodium 130 (*)    Chloride 95 (*)    Total Protein 8.2 (*)    All other components within normal  limits  CBC WITH DIFFERENTIAL/PLATELET - Abnormal; Notable for the following components:   WBC 19.0 (*)    Neutro Abs 17.6 (*)    Lymphs Abs 0.6 (*)    Abs Immature Granulocytes 0.18 (*)    All other components within normal limits  PROTIME-INR - Abnormal; Notable for the following components:   Prothrombin Time 21.9 (*)    INR 1.9 (*)    All other components within normal limits  URINALYSIS, ROUTINE W REFLEX MICROSCOPIC - Abnormal; Notable for the following components:   Hgb urine dipstick MODERATE (*)    All other components within normal limits  BASIC METABOLIC PANEL - Abnormal; Notable for the following components:   Sodium 128 (*)    CO2 20 (*)    Calcium 7.3 (*)    GFR calc non Af Amer 57 (*)    All other components within normal limits  PROTIME-INR - Abnormal; Notable for the following components:   Prothrombin Time 23.1 (*)    INR 2.1 (*)    All other components within normal limits  CBC - Abnormal; Notable for the following components:   WBC 33.6 (*)    RBC 3.41 (*)    Hemoglobin 10.2 (*)    HCT 31.8 (*)    All other components within normal limits  PROTIME-INR - Abnormal; Notable for the following components:   Prothrombin Time 17.2 (*)  INR 1.4 (*)    All other components within normal limits  CBC - Abnormal; Notable for the following components:   WBC 29.2 (*)    RBC 3.50 (*)    Hemoglobin 10.4 (*)    HCT 33.2 (*)    All other components within normal limits  BASIC METABOLIC PANEL - Abnormal; Notable for the following components:   Sodium 129 (*)    CO2 21 (*)    Glucose, Bld 101 (*)    Calcium 7.9 (*)    All other components within normal limits  PROTIME-INR - Abnormal; Notable for the following components:   Prothrombin Time 20.2 (*)    INR 1.8 (*)    All other components within normal limits  COMPREHENSIVE METABOLIC PANEL - Abnormal; Notable for the following components:   Sodium 128 (*)    Chloride 96 (*)    Calcium 7.6 (*)    Total Protein 5.6  (*)    Albumin 2.2 (*)    All other components within normal limits  TROPONIN I - Abnormal; Notable for the following components:   Troponin I 0.10 (*)    All other components within normal limits  CBC - Abnormal; Notable for the following components:   WBC 18.6 (*)    RBC 3.15 (*)    Hemoglobin 9.2 (*)    HCT 28.9 (*)    Platelets 147 (*)    All other components within normal limits  TROPONIN I - Abnormal; Notable for the following components:   Troponin I 0.10 (*)    All other components within normal limits  TROPONIN I - Abnormal; Notable for the following components:   Troponin I 0.09 (*)    All other components within normal limits  PROTIME-INR - Abnormal; Notable for the following components:   Prothrombin Time 25.7 (*)    INR 2.4 (*)    All other components within normal limits  BASIC METABOLIC PANEL - Abnormal; Notable for the following components:   Sodium 132 (*)    Chloride 95 (*)    Glucose, Bld 122 (*)    Calcium 7.8 (*)    All other components within normal limits  CBC WITH DIFFERENTIAL/PLATELET - Abnormal; Notable for the following components:   WBC 13.8 (*)    RBC 3.08 (*)    Hemoglobin 9.0 (*)    HCT 28.4 (*)    Neutro Abs 10.2 (*)    Monocytes Absolute 1.5 (*)    Abs Immature Granulocytes 0.08 (*)    All other components within normal limits  CULTURE, BLOOD (ROUTINE X 2)  C DIFFICILE QUICK SCREEN W PCR REFLEX  MRSA PCR SCREENING  CULTURE, BLOOD (ROUTINE X 2)  CULTURE, BLOOD (ROUTINE X 2)  LACTIC ACID, PLASMA  INFLUENZA PANEL BY PCR (TYPE A & B)  LACTIC ACID, PLASMA  LACTIC ACID, PLASMA    EKG None  Radiology Dg Chest 2 View  Result Date: 06/14/2018 CLINICAL DATA:  Cough for 3 days COMPARISON:  Radiograph 05/08/2018 FINDINGS: Sternotomy wires overlie enlarged cardiac silhouette. Small pleural effusion seen on lateral projection. Upper lungs are clear. No focal consolidation. Atherosclerotic calcification of the aorta. IMPRESSION: Cardiomegaly  and bilateral effusions. Findings suggest congestive heart failure Electronically Signed   By: Suzy Bouchard M.D.   On: 06/14/2018 10:39    Procedures Procedures (including critical care time)  Medications Ordered in ED Medications  sodium chloride flush (NS) 0.9 % injection 3 mL (has no administration in time range)  vancomycin (  VANCOCIN) IVPB 1000 mg/200 mL premix (has no administration in time range)  acetaminophen (TYLENOL) tablet 650 mg (650 mg Oral Given 06/14/18 1002)  ceFEPIme (MAXIPIME) 2 g in sodium chloride 0.9 % 100 mL IVPB (2 g Intravenous New Bag/Given 06/14/18 1040)     Initial Impression / Assessment and Plan / ED Course  I have reviewed the triage vital signs and the nursing notes.  Pertinent labs & imaging results that were available during my care of the patient were reviewed by me and considered in my medical decision making (see chart for details).        85 year old female with encephalopathy likely related to febrile illness.  Empiric antibiotics were given.  At this time I do not a clear source though.  Given this change in her mental status I feel that she needs admitted for further evaluation.  Final Clinical Impressions(s) / ED Diagnoses   Final diagnoses:  Fever, unspecified  Encephalopathy    ED Discharge Orders    None       Virgel Manifold, MD 06/18/18 1028

## 2018-06-14 NOTE — Progress Notes (Signed)
ED NCM consulted for [facility], received message from [facility]. Pt is from [facility], CSW referral. NCM will continue to follow pt for dc needs. Jonnie Finner RN CCM Case Mgmt phone [phone]

## 2018-06-14 NOTE — Progress Notes (Signed)
PHARMACY - PHYSICIAN COMMUNICATION CRITICAL VALUE ALERT - BLOOD CULTURE IDENTIFICATION (BCID)  Brittany [name] is an 85 y.o. female who presented to [facility] on 06/14/2018 with a chief complaint of fevers, rigor, weakness, fall  Assessment:  Unknown source (include suspected source if known) 2 of 3 bottles strep species- possible contaminant but d/t clinical picture will continue abxs T=102.1 and HR = 125 Name of physician (or Provider) Contacted: Lamar Blinks, NP  Current antibiotics: Cefepime  Changes to prescribed antibiotics recommended:  Narrow to Rocephin 2 Gm IV q24h per algorighm  Results for orders placed or performed during the hospital encounter of 06/14/18  Blood Culture ID Panel (Reflexed) (Collected: 06/14/2018  9:50 AM)  Result Value Ref Range   Enterococcus species NOT DETECTED NOT DETECTED   Listeria monocytogenes NOT DETECTED NOT DETECTED   Staphylococcus species NOT DETECTED NOT DETECTED   Staphylococcus aureus (BCID) NOT DETECTED NOT DETECTED   Streptococcus species DETECTED (A) NOT DETECTED   Streptococcus agalactiae NOT DETECTED NOT DETECTED   Streptococcus pneumoniae NOT DETECTED NOT DETECTED   Streptococcus pyogenes NOT DETECTED NOT DETECTED   Acinetobacter baumannii NOT DETECTED NOT DETECTED   Enterobacteriaceae species NOT DETECTED NOT DETECTED   Enterobacter cloacae complex NOT DETECTED NOT DETECTED   Escherichia coli NOT DETECTED NOT DETECTED   Klebsiella oxytoca NOT DETECTED NOT DETECTED   Klebsiella pneumoniae NOT DETECTED NOT DETECTED   Proteus species NOT DETECTED NOT DETECTED   Serratia marcescens NOT DETECTED NOT DETECTED   Haemophilus influenzae NOT DETECTED NOT DETECTED   Neisseria meningitidis NOT DETECTED NOT DETECTED   Pseudomonas aeruginosa NOT DETECTED NOT DETECTED   Candida albicans NOT DETECTED NOT DETECTED   Candida glabrata NOT DETECTED NOT DETECTED   Candida krusei NOT DETECTED NOT DETECTED   Candida parapsilosis NOT DETECTED NOT  DETECTED   Candida tropicalis NOT DETECTED NOT DETECTED    Dorrene German 06/14/2018  11:28 PM

## 2018-06-14 NOTE — ED Notes (Signed)
Pt difficult stick, unable to obtain second set of cultures at this time

## 2018-06-14 NOTE — ED Notes (Signed)
Attempted x2 to call transport to take pt upstairs

## 2018-06-15 ENCOUNTER — Encounter (HOSPITAL_BASED_OUTPATIENT_CLINIC_OR_DEPARTMENT_OTHER): Payer: Medicare Other | Attending: Internal Medicine

## 2018-06-15 DIAGNOSIS — I340 Nonrheumatic mitral (valve) insufficiency: Secondary | ICD-10-CM | POA: Diagnosis not present

## 2018-06-15 DIAGNOSIS — L539 Erythematous condition, unspecified: Secondary | ICD-10-CM | POA: Diagnosis not present

## 2018-06-15 DIAGNOSIS — X58XXXA Exposure to other specified factors, initial encounter: Secondary | ICD-10-CM | POA: Diagnosis not present

## 2018-06-15 DIAGNOSIS — R079 Chest pain, unspecified: Secondary | ICD-10-CM | POA: Diagnosis not present

## 2018-06-15 DIAGNOSIS — L03116 Cellulitis of left lower limb: Secondary | ICD-10-CM | POA: Diagnosis present

## 2018-06-15 DIAGNOSIS — B954 Other streptococcus as the cause of diseases classified elsewhere: Secondary | ICD-10-CM | POA: Diagnosis not present

## 2018-06-15 DIAGNOSIS — M549 Dorsalgia, unspecified: Secondary | ICD-10-CM | POA: Diagnosis present

## 2018-06-15 DIAGNOSIS — I10 Essential (primary) hypertension: Secondary | ICD-10-CM | POA: Diagnosis not present

## 2018-06-15 DIAGNOSIS — I482 Chronic atrial fibrillation, unspecified: Secondary | ICD-10-CM | POA: Diagnosis present

## 2018-06-15 DIAGNOSIS — Z952 Presence of prosthetic heart valve: Secondary | ICD-10-CM | POA: Diagnosis not present

## 2018-06-15 DIAGNOSIS — G8929 Other chronic pain: Secondary | ICD-10-CM | POA: Diagnosis present

## 2018-06-15 DIAGNOSIS — Z8249 Family history of ischemic heart disease and other diseases of the circulatory system: Secondary | ICD-10-CM | POA: Diagnosis not present

## 2018-06-15 DIAGNOSIS — R7881 Bacteremia: Secondary | ICD-10-CM | POA: Diagnosis not present

## 2018-06-15 DIAGNOSIS — H9190 Unspecified hearing loss, unspecified ear: Secondary | ICD-10-CM | POA: Diagnosis present

## 2018-06-15 DIAGNOSIS — J90 Pleural effusion, not elsewhere classified: Secondary | ICD-10-CM | POA: Diagnosis not present

## 2018-06-15 DIAGNOSIS — S91302A Unspecified open wound, left foot, initial encounter: Secondary | ICD-10-CM | POA: Diagnosis not present

## 2018-06-15 DIAGNOSIS — Z888 Allergy status to other drugs, medicaments and biological substances status: Secondary | ICD-10-CM | POA: Diagnosis not present

## 2018-06-15 DIAGNOSIS — Z885 Allergy status to narcotic agent status: Secondary | ICD-10-CM | POA: Diagnosis not present

## 2018-06-15 DIAGNOSIS — N183 Chronic kidney disease, stage 3 (moderate): Secondary | ICD-10-CM | POA: Diagnosis present

## 2018-06-15 DIAGNOSIS — M21372 Foot drop, left foot: Secondary | ICD-10-CM | POA: Diagnosis present

## 2018-06-15 DIAGNOSIS — Z8711 Personal history of peptic ulcer disease: Secondary | ICD-10-CM | POA: Diagnosis not present

## 2018-06-15 DIAGNOSIS — G9341 Metabolic encephalopathy: Secondary | ICD-10-CM | POA: Diagnosis present

## 2018-06-15 DIAGNOSIS — F0390 Unspecified dementia without behavioral disturbance: Secondary | ICD-10-CM | POA: Diagnosis present

## 2018-06-15 DIAGNOSIS — Z87891 Personal history of nicotine dependence: Secondary | ICD-10-CM | POA: Diagnosis not present

## 2018-06-15 DIAGNOSIS — Z95828 Presence of other vascular implants and grafts: Secondary | ICD-10-CM | POA: Diagnosis not present

## 2018-06-15 DIAGNOSIS — R7989 Other specified abnormal findings of blood chemistry: Secondary | ICD-10-CM | POA: Diagnosis not present

## 2018-06-15 DIAGNOSIS — R0789 Other chest pain: Secondary | ICD-10-CM | POA: Diagnosis not present

## 2018-06-15 DIAGNOSIS — Z8673 Personal history of transient ischemic attack (TIA), and cerebral infarction without residual deficits: Secondary | ICD-10-CM | POA: Diagnosis not present

## 2018-06-15 DIAGNOSIS — A408 Other streptococcal sepsis: Secondary | ICD-10-CM | POA: Diagnosis present

## 2018-06-15 DIAGNOSIS — E785 Hyperlipidemia, unspecified: Secondary | ICD-10-CM | POA: Diagnosis present

## 2018-06-15 DIAGNOSIS — Z881 Allergy status to other antibiotic agents status: Secondary | ICD-10-CM | POA: Diagnosis not present

## 2018-06-15 DIAGNOSIS — Z8679 Personal history of other diseases of the circulatory system: Secondary | ICD-10-CM | POA: Diagnosis not present

## 2018-06-15 DIAGNOSIS — R509 Fever, unspecified: Secondary | ICD-10-CM | POA: Diagnosis not present

## 2018-06-15 DIAGNOSIS — E871 Hypo-osmolality and hyponatremia: Secondary | ICD-10-CM | POA: Diagnosis present

## 2018-06-15 DIAGNOSIS — N390 Urinary tract infection, site not specified: Secondary | ICD-10-CM | POA: Diagnosis present

## 2018-06-15 DIAGNOSIS — L97529 Non-pressure chronic ulcer of other part of left foot with unspecified severity: Secondary | ICD-10-CM | POA: Diagnosis not present

## 2018-06-15 DIAGNOSIS — R011 Cardiac murmur, unspecified: Secondary | ICD-10-CM | POA: Diagnosis not present

## 2018-06-15 DIAGNOSIS — B955 Unspecified streptococcus as the cause of diseases classified elsewhere: Secondary | ICD-10-CM | POA: Diagnosis not present

## 2018-06-15 DIAGNOSIS — R197 Diarrhea, unspecified: Secondary | ICD-10-CM | POA: Diagnosis not present

## 2018-06-15 DIAGNOSIS — M79644 Pain in right finger(s): Secondary | ICD-10-CM | POA: Diagnosis not present

## 2018-06-15 DIAGNOSIS — Z87440 Personal history of urinary (tract) infections: Secondary | ICD-10-CM | POA: Diagnosis not present

## 2018-06-15 DIAGNOSIS — K449 Diaphragmatic hernia without obstruction or gangrene: Secondary | ICD-10-CM | POA: Diagnosis present

## 2018-06-15 DIAGNOSIS — M1990 Unspecified osteoarthritis, unspecified site: Secondary | ICD-10-CM | POA: Diagnosis not present

## 2018-06-15 DIAGNOSIS — I5032 Chronic diastolic (congestive) heart failure: Secondary | ICD-10-CM | POA: Diagnosis present

## 2018-06-15 DIAGNOSIS — Z452 Encounter for adjustment and management of vascular access device: Secondary | ICD-10-CM | POA: Diagnosis not present

## 2018-06-15 DIAGNOSIS — Z974 Presence of external hearing-aid: Secondary | ICD-10-CM | POA: Diagnosis not present

## 2018-06-15 DIAGNOSIS — K219 Gastro-esophageal reflux disease without esophagitis: Secondary | ICD-10-CM | POA: Diagnosis not present

## 2018-06-15 DIAGNOSIS — K5790 Diverticulosis of intestine, part unspecified, without perforation or abscess without bleeding: Secondary | ICD-10-CM | POA: Diagnosis present

## 2018-06-15 DIAGNOSIS — I130 Hypertensive heart and chronic kidney disease with heart failure and stage 1 through stage 4 chronic kidney disease, or unspecified chronic kidney disease: Secondary | ICD-10-CM | POA: Diagnosis present

## 2018-06-15 DIAGNOSIS — A419 Sepsis, unspecified organism: Secondary | ICD-10-CM | POA: Diagnosis not present

## 2018-06-15 DIAGNOSIS — J439 Emphysema, unspecified: Secondary | ICD-10-CM | POA: Diagnosis present

## 2018-06-15 DIAGNOSIS — I350 Nonrheumatic aortic (valve) stenosis: Secondary | ICD-10-CM | POA: Diagnosis not present

## 2018-06-15 DIAGNOSIS — Z7901 Long term (current) use of anticoagulants: Secondary | ICD-10-CM | POA: Diagnosis not present

## 2018-06-15 LAB — CBC
HCT: 31.8 % — ABNORMAL LOW (ref 36.0–46.0)
Hemoglobin: 10.2 g/dL — ABNORMAL LOW (ref 12.0–15.0)
MCH: 29.9 pg (ref 26.0–34.0)
MCHC: 32.1 g/dL (ref 30.0–36.0)
MCV: 93.3 fL (ref 80.0–100.0)
Platelets: 166 10*3/uL (ref 150–400)
RBC: 3.41 MIL/uL — ABNORMAL LOW (ref 3.87–5.11)
RDW: 13.6 % (ref 11.5–15.5)
WBC: 33.6 10*3/uL — ABNORMAL HIGH (ref 4.0–10.5)
nRBC: 0 % (ref 0.0–0.2)

## 2018-06-15 LAB — BASIC METABOLIC PANEL
Anion gap: 8 (ref 5–15)
BUN: 18 mg/dL (ref 8–23)
CO2: 20 mmol/L — ABNORMAL LOW (ref 22–32)
Calcium: 7.3 mg/dL — ABNORMAL LOW (ref 8.9–10.3)
Chloride: 100 mmol/L (ref 98–111)
Creatinine, Ser: 0.89 mg/dL (ref 0.44–1.00)
GFR calc Af Amer: >60 mL/min (ref >60)
GFR, EST NON AFRICAN AMERICAN: 57 mL/min — AB (ref >60)
Glucose, Bld: 94 mg/dL (ref 70–99)
Potassium: 4.4 mmol/L (ref 3.5–5.1)
Sodium: 128 mmol/L — ABNORMAL LOW (ref 135–145)

## 2018-06-15 LAB — PROTIME-INR
INR: 2.1 — ABNORMAL HIGH (ref 0.8–1.2)
PROTHROMBIN TIME: 23.1 s — AB (ref 11.4–15.2)

## 2018-06-15 LAB — C DIFFICILE QUICK SCREEN W PCR REFLEX
C Diff antigen: NEGATIVE
C Diff interpretation: NOT DETECTED
C Diff toxin: NEGATIVE

## 2018-06-15 MED ORDER — PRO-STAT SUGAR FREE PO LIQD
30.0000 mL | Freq: Two times a day (BID) | ORAL | Status: DC
  Administered 2018-06-15 – 2018-06-21 (×2): 30 mL via ORAL
  Filled 2018-06-15 (×8): qty 30

## 2018-06-15 MED ORDER — WARFARIN SODIUM 6 MG PO TABS
6.0000 mg | ORAL_TABLET | Freq: Once | ORAL | Status: AC
  Administered 2018-06-15: 6 mg via ORAL
  Filled 2018-06-15: qty 1

## 2018-06-15 MED ORDER — ENSURE ENLIVE PO LIQD
237.0000 mL | Freq: Two times a day (BID) | ORAL | Status: DC
  Administered 2018-06-16 – 2018-06-21 (×5): 237 mL via ORAL

## 2018-06-15 MED ORDER — SODIUM CHLORIDE 0.9 % IV SOLN
2.0000 g | Freq: Every day | INTRAVENOUS | Status: DC
  Administered 2018-06-15 – 2018-06-21 (×7): 2 g via INTRAVENOUS
  Filled 2018-06-15 (×3): qty 2
  Filled 2018-06-15: qty 20
  Filled 2018-06-15 (×3): qty 2

## 2018-06-15 NOTE — Plan of Care (Signed)
Plan of care reviewed and discussed with the patient and her daughter. 

## 2018-06-15 NOTE — Progress Notes (Signed)
ANTICOAGULATION CONSULT NOTE - Initial Consult  Pharmacy Consult for warfarin Indication: atrial fibrillation and aortic valve replacement  Allergies  Allergen Reactions  . Amlodipine Swelling and Other (See Comments)     Unspecified swelling reaction  . Amoxicillin Diarrhea, Nausea And Vomiting and Other (See Comments)    Has patient had a PCN reaction causing immediate rash, facial/tongue/throat swelling, SOB or lightheadedness with hypotension: No Has patient had a PCN reaction causing severe rash involving mucus membranes or skin necrosis: No Has patient had a PCN reaction that required hospitalization: No Has patient had a PCN reaction occurring within the last 10 years: No If all of the above answers are "NO", then may proceed with Cephalosporin use.  . Carafate [Sucralfate] Swelling and Other (See Comments)    Reaction:  Knee swelling/redness   . Cephalexin Diarrhea  . Codeine Nausea And Vomiting  . Irbesartan Swelling and Other (See Comments)    Reaction:  Facial/hand swelling and numbness   . Morphine And Related Other (See Comments)    Pt states that she has a history of addiction with this medication.      . Neurontin [Gabapentin] Swelling and Other (See Comments)    Reaction:  Leg swelling   . Nitrofurantoin Monohyd Macro Diarrhea and Nausea And Vomiting  . Prednisone Other (See Comments)    Reaction:  Elevated BP  . Sulfa Antibiotics Rash    Patient Measurements: Height: 5\' 1"  (154.9 cm) Weight: 105 lb (47.6 kg) IBW/kg (Calculated) : 47.8  Vital Signs: Temp: 97.3 F (36.3 C) (03/06 0807) Temp Source: Oral (03/06 0807) BP: 102/45 (03/06 0807) Pulse Rate: 68 (03/06 0807)  Labs: Recent Labs    06/14/18 0950 06/15/18 0453 06/15/18 0614  HGB 13.3  --  10.2*  HCT 40.4  --  31.8*  PLT 256  --  166  LABPROT 21.9* 23.1*  --   INR 1.9* 2.1*  --   CREATININE 0.78 0.89  --     Estimated Creatinine Clearance: 32.2 mL/min (by C-G formula based on SCr of 0.89  mg/dL).   Medical History: Past Medical History:  Diagnosis Date  . Abnormal liver diagnostic imaging    suspected cirrhosis  . Arthritis   . Atrial fibrillation ([place])   . Chronic back pain   . Diverticulosis   . Dyslipidemia    takes Lipitor daily  . Dysrhythmia    Atrial Fibrillation  . Foot drop    SINCE 1987  . GERD (gastroesophageal reflux disease)   . H/O hiatal hernia   . Hearing impaired    Bilateral hearing aids  . History of bacterial endocarditis   . History of gastritis   . History of stomach ulcers    many yrs ago  . History of TIA (transient ischemic attack)    2013-- RESIDUAL PERIPHERAL VISION RIGHT EYE--  RESOLVED  . Hypertension   . IC (interstitial cystitis)   . Macular degeneration   . Pulmonary hypertension ([place]) 10/2013   Based on TTE  . S/P aortic valve replacement    2005  . Urine incontinence     Medications:  Scheduled:  . atorvastatin  20 mg Oral Daily  . busPIRone  15 mg Oral BID  . cycloSPORINE  1 drop Both Eyes BID  . diltiazem  120 mg Oral BID  . enoxaparin (LOVENOX) injection  40 mg Subcutaneous Q24H  . lisinopril  20 mg Oral BID  . multivitamin  1 tablet Oral BID  . oxybutynin  5  mg Oral Daily  . pantoprazole  20 mg Oral Daily  . polyvinyl alcohol  1 drop Both Eyes BID  . sodium chloride flush  3 mL Intravenous Once  . warfarin  6 mg Oral ONCE-1800  . Warfarin - Pharmacist Dosing Inpatient   Does not apply q1800   Infusions:  . sodium chloride 75 mL/hr at 06/15/18 0852  . cefTRIAXone (ROCEPHIN)  IV Stopped (06/15/18 0559)    Assessment: 85 yo female presented to ER after fall with fever and tachycardia. Pt takes warfarin PTA for atrial fibrillation and history of aortic valve replacement. Pharmacy consulted to dose/monitor warfarin during admission.  Home dose/INR goal (confirmed with outpatient clinic note from 06/08/18): Dose: Warfarin 2.5 mg on Tuesday & Sat; 5 mg on all other days of the week  Goal: INR 2-3  INR 1.9  therapeutic on admission. LD PTA 3/4   Today, 06/15/18  INR 2.1 is therapeutic  Hgb 10.2 decreased from admission. No bleeding documented.  Plt - WNL  Enoxaparin 40 mg subQ daily ordered  No significant drug interactions noted. Pt is on antibiotics.  Warfarin dose 3/5 not charted as being given.  Goal of Therapy:  INR 2-3   Plan:   Discontinue enoxaparin since INR is therapeutic  Warfarin 6 mg PO once this evening  INR daily  Recheck CBC with AM labs tomorrow  Closely monitor for signs/symptoms of bleeding or thrombosis  Lenis Noon, PharmD 06/15/18 10:33 AM

## 2018-06-15 NOTE — Progress Notes (Signed)
PROGRESS NOTE  Brittany [name]  UPJ:[id number] DOB: 1927/11/06 DOA: 06/14/2018 PCP: Mayra Neer, MD   Brief Narrative: Brittany [name] is an 85 y.o. female with a history of cirrhosis, chronic AFib, chronic back pain, HTN, gastritis/GERD, pulmonary HTN, AVR 2005 on coumadin, and bacterial endocarditis who presented from ALF with fever, weakness, and an unwitnessed fall. She was found to be febrile to 102.61F, tachypneic but not hypoxic. WBC 19k, INR 1.9. CXR demonstrated stable cardiomegaly and pleural effusions. Flu negative. She's had a chronic left foot ulcer under 3x weekly treatment by wound care RN and recently was noted to have malodorous discharge, now with increasing erythema. XR of the foot did not reveal osteomyelitis. Urinalysis was pending at admission, though urinary source was suspected based on history of recurrent UTIs. She denies diarrhea, though CDiff was checked and found to be negative. Vancomycin and cefepime were started after blood cultures were drawn. Cultures have grown group G strep. Antibiotics narrowed to ceftriaxone. WBC rose from 19k to 33.6k though thus far the patient appears nontoxic and had defervesced.   Assessment & Plan: Active Problems:   Aortic stenosis   Hypertension   Chronic atrial fibrillation (HCC)   Esophageal reflux   Hyponatremia   Diarrhea   Dementia (HCC)   Chronic ulcer of left foot (HCC)   Sepsis secondary to UTI ([place])  Sepsis due to group G strep bacteremia, source suspected to be left foot ulcer with cellulitis: - Continue to be hypotensive and leukocytosis increased to 33k. W/history of edema and cardiomegaly will stop IVFs, taking good po, and lisinopril - Check echocardiogram. At very high risk of endocarditis with AVR - Repeated blood cultures - Has RF's for joint seeding (mostly age, OA), will monitor for symptoms of septic arthritis.  - Continue ceftriaxone 2g q24h. Note allergies to amoxicillin, keflex, sulfa, and macrobid. -  Will consult ID for further recommendations. - WOC consulted for local wound care. Does not appear to require surgical debridement at this time.  Chronic atrial fibrillation:  - Rate is controlled, continue diltiazem - Continue coumadin as below  Chronic diastolic CHF: No acute exacerbation, but will be judicious with IV fluids. Echo April 2019 w/EF 92-92%, diastolic function not mentioned due to AFib.  History of brioprosthetic AVR: Mild stenosis on most recent echo. - Continue coumadin with INR goal 2-3, dosing per pharmacy. INR 2.1 today.  Pulmonary HTN:  - Echo as above  Left foot drop: s/p orthopedic procedure 1987.  - Fall precautions  Fall at home:  - Fall precautions, PT, OT eval  Dyslipidemia:  - Continue statin  GERD:  - PPI  [place] 4/14  DVT prophylaxis: Coumadin Code Status: DNR Family Communication: Daughter at bedside Disposition Plan: Anticipate return to ALF once clinically stabilizing.   Consultants:   ID  Procedures:   Echocardiogram ordered  Antimicrobials:  Vancomycin 3/5  Cefepime 3/5  Ceftriaxone 3/6 >>    Subjective: Feels well, having regular BMs, denies abdominal pain. No chest pain or dyspnea or cough. Denies dysuria or hematuria. Slipped when getting out of bed while not wearing socks. Wasn't wearing socks because of pain in the left foot. Ulcer did drain some exudate per report.  Objective: Vitals:   06/15/18 0058 06/15/18 0613 06/15/18 0807 06/15/18 1306  BP:  (!) 85/42 (!) 102/45 (!) 128/56  Pulse: 93 (!) 52 68 82  Resp: 16 16 16 16   Temp: 100 F (37.8 C) (!) 97.5 F (36.4 C) (!) 97.3 F (36.3 C)  98.9 F (37.2 C)  TempSrc: Oral Oral Oral Oral  SpO2: 95% 96% 97% 97%  Weight:    62.2 kg  Height:        Intake/Output Summary (Last 24 hours) at 06/15/2018 1704 Last data filed at 06/15/2018 1320 Gross per 24 hour  Intake 2475 ml  Output 500 ml  Net 1975 ml   Filed Weights   06/14/18 0947 06/14/18 1609 06/15/18 1306    Weight: 47.6 kg 47.6 kg 62.2 kg    Gen: Elderly, frail female in no distress Pulm: Non-labored breathing room air. Clear to auscultation bilaterally.  CV: Irreg irreg. No murmur, rub, or gallop. No JVD, no significant pedal edema. GI: Abdomen soft, +right abdominal hernia, very large, nontender, non-distended, with normoactive bowel sounds. No organomegaly or masses felt. Ext: Warm, no deformities. Tender palpable lymphadenopathy in left inguinal area. 1+ DP pulses bilaterally.  Skin: Left lateral foot with raised callused ulceration with malodorous scant discharge, no fluctuance, tender, ill-defined surrounding erythema. Varicosities on bilateral feet. Neuro: Alert and oriented. No focal neurological deficits. Psych: Judgement and insight appear normal. Mood & affect appropriate.   Data Reviewed: I have personally reviewed following labs and imaging studies  CBC: Recent Labs  Lab 06/14/18 0950 06/15/18 0614  WBC 19.0* 33.6*  NEUTROABS 17.6*  --   HGB 13.3 10.2*  HCT 40.4 31.8*  MCV 91.4 93.3  PLT 256 732   Basic Metabolic Panel: Recent Labs  Lab 06/14/18 0950 06/15/18 0453  NA 130* 128*  K 4.1 4.4  CL 95* 100  CO2 25 20*  GLUCOSE 91 94  BUN 18 18  CREATININE 0.78 0.89  CALCIUM 8.9 7.3*   GFR: Estimated Creatinine Clearance: 36.3 mL/min (by C-G formula based on SCr of 0.89 mg/dL). Liver Function Tests: Recent Labs  Lab 06/14/18 0950  AST 31  ALT 26  ALKPHOS 90  BILITOT 1.2  PROT 8.2*  ALBUMIN 3.8   No results for input(s): LIPASE, AMYLASE in the last 168 hours. No results for input(s): AMMONIA in the last 168 hours. Coagulation Profile: Recent Labs  Lab 06/14/18 0950 06/15/18 0453  INR 1.9* 2.1*   Cardiac Enzymes: No results for input(s): CKTOTAL, CKMB, CKMBINDEX, TROPONINI in the last 168 hours. BNP (last 3 results) No results for input(s): PROBNP in the last 8760 hours. HbA1C: No results for input(s): HGBA1C in the last 72 hours. CBG: No  results for input(s): GLUCAP in the last 168 hours. Lipid Profile: No results for input(s): CHOL, HDL, LDLCALC, TRIG, CHOLHDL, LDLDIRECT in the last 72 hours. Thyroid Function Tests: No results for input(s): TSH, T4TOTAL, FREET4, T3FREE, THYROIDAB in the last 72 hours. Anemia Panel: No results for input(s): VITAMINB12, FOLATE, FERRITIN, TIBC, IRON, RETICCTPCT in the last 72 hours. Urine analysis:    Component Value Date/Time   COLORURINE YELLOW 06/14/2018 1117   APPEARANCEUR CLEAR 06/14/2018 1117   LABSPEC 1.010 06/14/2018 1117   PHURINE 7.0 06/14/2018 1117   GLUCOSEU NEGATIVE 06/14/2018 1117   GLUCOSEU NEGATIVE 06/25/2014 1201   HGBUR MODERATE (A) 06/14/2018 1117   BILIRUBINUR NEGATIVE 06/14/2018 1117   KETONESUR NEGATIVE 06/14/2018 1117   PROTEINUR NEGATIVE 06/14/2018 1117   UROBILINOGEN 0.2 08/27/2014 1954   NITRITE NEGATIVE 06/14/2018 1117   LEUKOCYTESUR NEGATIVE 06/14/2018 1117   Recent Results (from the past 240 hour(s))  Culture, blood (Routine x 2)     Status: Abnormal (Preliminary result)   Collection Time: 06/14/18  9:50 AM  Result Value Ref Range Status   Specimen  Description   Final    BLOOD RIGHT ANTECUBITAL Performed at [facility] [address], [place], [place] [address]    Special Requests   Final    BOTTLES DRAWN AEROBIC AND ANAEROBIC Blood Culture results may not be optimal due to an excessive volume of blood received in culture bottles Performed at [facility] [address] [place], [place] [address]    Culture  Setup Time   Final    GRAM POSITIVE COCCI IN BOTH AEROBIC AND ANAEROBIC BOTTLES CRITICAL RESULT CALLED TO, READ BACK BY AND VERIFIED WITH: B GREEN PHARMD 06/14/18 2321 JDW    Culture (A)  Final    STREPTOCOCCUS GROUP G SUSCEPTIBILITIES TO FOLLOW Performed at [facility], [place] [address], [place], [place] [address]    Report Status PENDING  Incomplete  Blood Culture ID Panel (Reflexed)      Status: Abnormal   Collection Time: 06/14/18  9:50 AM  Result Value Ref Range Status   Enterococcus species NOT DETECTED NOT DETECTED Final   Listeria monocytogenes NOT DETECTED NOT DETECTED Final   Staphylococcus species NOT DETECTED NOT DETECTED Final   Staphylococcus aureus (BCID) NOT DETECTED NOT DETECTED Final   Streptococcus species DETECTED (A) NOT DETECTED Final    Comment: Not Enterococcus species, Streptococcus agalactiae, Streptococcus pyogenes, or Streptococcus pneumoniae. CRITICAL RESULT CALLED TO, READ BACK BY AND VERIFIED WITH: B GREEN PHARMD 06/14/18 2321 JDW    Streptococcus agalactiae NOT DETECTED NOT DETECTED Final   Streptococcus pneumoniae NOT DETECTED NOT DETECTED Final   Streptococcus pyogenes NOT DETECTED NOT DETECTED Final   Acinetobacter baumannii NOT DETECTED NOT DETECTED Final   Enterobacteriaceae species NOT DETECTED NOT DETECTED Final   Enterobacter cloacae complex NOT DETECTED NOT DETECTED Final   Escherichia coli NOT DETECTED NOT DETECTED Final   Klebsiella oxytoca NOT DETECTED NOT DETECTED Final   Klebsiella pneumoniae NOT DETECTED NOT DETECTED Final   Proteus species NOT DETECTED NOT DETECTED Final   Serratia marcescens NOT DETECTED NOT DETECTED Final   Haemophilus influenzae NOT DETECTED NOT DETECTED Final   Neisseria meningitidis NOT DETECTED NOT DETECTED Final   Pseudomonas aeruginosa NOT DETECTED NOT DETECTED Final   Candida albicans NOT DETECTED NOT DETECTED Final   Candida glabrata NOT DETECTED NOT DETECTED Final   Candida krusei NOT DETECTED NOT DETECTED Final   Candida parapsilosis NOT DETECTED NOT DETECTED Final   Candida tropicalis NOT DETECTED NOT DETECTED Final    Comment: Performed at [facility], [place] [address], [place], [place] [address]  Culture, blood (Routine x 2)     Status: None (Preliminary result)   Collection Time: 06/14/18 11:18 AM  Result Value Ref Range Status   Specimen Description   Final    BLOOD RIGHT  HAND Performed at [facility] [address], [place], [place] [address]    Special Requests   Final    BOTTLES DRAWN AEROBIC ONLY Blood Culture results may not be optimal due to an inadequate volume of blood received in culture bottles Performed at [facility] [address], [place], [place] [address]    Culture   Final    NO GROWTH 1 DAY Performed at [facility], [place] [address], [place], [place] [address]    Report Status PENDING  Incomplete  MRSA PCR Screening     Status: None   Collection Time: 06/14/18  5:52 PM  Result Value Ref Range Status   MRSA  by PCR NEGATIVE NEGATIVE Final    Comment:        The GeneXpert MRSA Assay (FDA approved for NASAL specimens only), is one component of a comprehensive MRSA colonization surveillance program. It is not intended to diagnose MRSA infection nor to guide or monitor treatment for MRSA infections. Performed at [facility], [place] [address], [place], [place] [address]   C difficile quick scan w PCR reflex     Status: None   Collection Time: 06/15/18  5:35 AM  Result Value Ref Range Status   C Diff antigen NEGATIVE NEGATIVE Final   C Diff toxin NEGATIVE NEGATIVE Final   C Diff interpretation No C. difficile detected.  Final    Comment: Performed at [facility], [place] [address], [place], [place] [address]      Radiology Studies: Dg Chest 2 View  Result Date: 06/14/2018 CLINICAL DATA:  Cough for 3 days COMPARISON:  Radiograph 05/08/2018 FINDINGS: Sternotomy wires overlie enlarged cardiac silhouette. Small pleural effusion seen on lateral projection. Upper lungs are clear. No focal consolidation. Atherosclerotic calcification of the aorta. IMPRESSION: Cardiomegaly and bilateral effusions. Findings suggest congestive heart failure Electronically Signed   By: Suzy Bouchard M.D.   On: 06/14/2018 10:39   Dg Lumbar Spine Complete  Result Date:  06/14/2018 CLINICAL DATA:  Post unwitnessed fall with low back pain. EXAM: LUMBAR SPINE - COMPLETE 4+ VIEW COMPARISON:  None. FINDINGS: There is no evidence of lumbar spine fracture. Alignment is normal. Multilevel osteoarthritic changes with disc space narrowing, subchondral sclerosis, osteophyte formation. Calcific atherosclerotic disease of the aorta. IMPRESSION: 1. No acute fracture or dislocation identified about the lumbosacral spine. 2. Multilevel osteoarthritic changes, moderate. Electronically Signed   By: Fidela Salisbury M.D.   On: 06/14/2018 13:32   Dg Foot 2 Views Left  Result Date: 06/14/2018 CLINICAL DATA:  Initial evaluation for nonhealing wound at lateral left foot. Recent trauma with fall. EXAM: LEFT FOOT - 2 VIEW COMPARISON:  Prior radiograph from 01/01/2012 FINDINGS: No acute fracture or dislocation. Fixation screws in place at the left third and fourth digits without complication. Scattered osteoarthritic changes present about the foot with underlying osteopenia. Soft tissue swelling overlies the lateral aspect of the base of the left fifth metatarsal, likely related to overlying ulcer/wound. No evidence for acute osteomyelitis. No dissecting soft tissue emphysema or other worrisome feature. Vascular calcifications noted within the lower leg. IMPRESSION: 1. Soft tissue ulceration/wound overlying the base of the left fifth metatarsal. No radiographic evidence for acute osteomyelitis. 2. No other acute osseous abnormality about the left foot. 3. Postoperative changes at the left third and fourth digits without adverse features. 4. Advanced osteopenia. Electronically Signed   By: Jeannine Boga M.D.   On: 06/14/2018 13:39    Scheduled Meds: . atorvastatin  20 mg Oral Daily  . busPIRone  15 mg Oral BID  . cycloSPORINE  1 drop Both Eyes BID  . diltiazem  120 mg Oral BID  . [START ON 06/16/2018] feeding supplement (ENSURE ENLIVE)  237 mL Oral BID BM  . feeding supplement (PRO-STAT  SUGAR FREE 64)  30 mL Oral BID  . lisinopril  20 mg Oral BID  . multivitamin  1 tablet Oral BID  . oxybutynin  5 mg Oral Daily  . pantoprazole  20 mg Oral Daily  . polyvinyl alcohol  1 drop Both Eyes BID  . sodium chloride flush  3 mL Intravenous Once  . warfarin  6 mg Oral ONCE-1800  .  Warfarin - Pharmacist Dosing Inpatient   Does not apply q1800   Continuous Infusions: . sodium chloride 75 mL/hr at 06/15/18 0852  . cefTRIAXone (ROCEPHIN)  IV Stopped (06/15/18 0559)     LOS: 0 days   Time spent: 35 minutes.  Patrecia Pour, MD [facility] [email or web address] Password [email or web address] 06/15/2018, 5:04 PM

## 2018-06-15 NOTE — Progress Notes (Signed)
Patient is a resident at [facility]. Patient currently has [facility] services arranged.  CSW will sign off. Please consult social work again if needs arise.   Kathrin Greathouse, Marlinda Mike, MSW Clinical Social Worker  [phone] 06/15/2018  2:10 PM

## 2018-06-15 NOTE — Progress Notes (Signed)
Initial Nutrition Assessment  DOCUMENTATION CODES:   Not applicable  INTERVENTION:  Ensure Enlive po BID, each supplement provides 350 kcal and 20 grams of protein  30 ml Prostat BID, each supplement provides 100 kcal and 15 grams protein.  NUTRITION DIAGNOSIS:   Increased nutrient needs related to wound healing as evidenced by estimated needs, per patient/family report.  GOAL:   Patient will meet greater than or equal to 90% of their needs   MONITOR:   PO intake, Supplement acceptance, Weight trends, Labs, Skin  REASON FOR ASSESSMENT:   Other (Comment)(Pressure Injury)    ASSESSMENT:   Pt is a 70y F from assisted living facility, with PMH of suspected cirrhosis, A.Fib (warfarin), chronic back pain, dyslipidemia, GERD, impaired hearing, HTN, gastritis, pulmonary HTN, bacterial endocarditis, and aortic valve replacement (2005). Pt presents to ED with fever, weakness, and fall. Pt is now admitted for sepsis secondary to UTI.  Pt appetite has been low for the last week, pt states that when she eats she begins to feel nauseous and stops eating. Prior the the nausea, she would eat fine. Pt daughter fixes most meals. Pt will drink a smoothie made from daughter in the morning, protein power, fruit and some vegetables. Daughter in law was in room and stated she thought the smoothie provided ~1500 calories. Typically the pt will eat ice cream and sherbet, [place] clubs, etc. The daughter is a Location manager according to pt, and she can eat her food without getting nausea.  Pt takes a MVI daily. Pt is agreeable to ensure chocolate. Pt is also agreeable to Prostat. Pt meals while admitted were <25% per pt report because of nausea.   Pt doesn't endorse weight loss stating that she is typically around 126-130#, weight in chart was 104# and they didn't think that was right at all, so upon weighing in bed, she was currently 62.2 kg (136.8#). So no weight loss.   Pt is mobile at baseline aided  with walker, and is receiving PT at ALF. Pt walks around complex daily. This is apparent in NFPE. Pt states that she has some issues with the wound on foot, but outpatient wound care is following, and is healing her pt report.   Emphasized small, frequent meals, and sips on ensure and prostat to help with wound healing.    Labs reviewed.  Medications reviewed and include:  MVI (Prosight)  0.9% Sodium Chloride Infusion 27mL/hr    NUTRITION - FOCUSED PHYSICAL EXAM:    Most Recent Value  Orbital Region  Mild depletion  Upper Arm Region  Mild depletion  Thoracic and Lumbar Region  Moderate depletion  Buccal Region  Mild depletion  Temple Region  Mild depletion  Clavicle Bone Region  Mild depletion  Clavicle and Acromion Bone Region  Mild depletion  Scapular Bone Region  Mild depletion  Dorsal Hand  Mild depletion  Patellar Region  No depletion  Anterior Thigh Region  No depletion  Posterior Calf Region  No depletion  Edema (RD Assessment)  None  Hair  Reviewed  Eyes  Reviewed  Mouth  Reviewed  Skin  Reviewed  Nails  Reviewed       Diet Order:   Diet Order            Diet regular Room service appropriate? Yes; Fluid consistency: Thin  Diet effective now              EDUCATION NEEDS:   Education needs have been addressed  Skin:  Skin  Assessment: Skin Integrity Issues: Skin Integrity Issues:: Other (Comment) Other: Bruise to right lower back. No skin breakage, Ulcer on left.  Last BM:  3/6  Height:   Ht Readings from Last 1 Encounters:  06/14/18 5\' 1"  (1.549 m)    Weight:   Wt Readings from Last 1 Encounters:  06/15/18 62.2 kg    Ideal Body Weight:  47.7 kg  BMI:  Body mass index is 25.91 kg/m.  Estimated Nutritional Needs:   Kcal:  1500-1800  Protein:  75-90 grams  Fluid:  >1.5L    Herma Carson, [place] Dietetic Intern

## 2018-06-16 ENCOUNTER — Inpatient Hospital Stay (HOSPITAL_COMMUNITY): Payer: Medicare Other

## 2018-06-16 ENCOUNTER — Inpatient Hospital Stay: Payer: Self-pay

## 2018-06-16 DIAGNOSIS — Z881 Allergy status to other antibiotic agents status: Secondary | ICD-10-CM

## 2018-06-16 DIAGNOSIS — L97529 Non-pressure chronic ulcer of other part of left foot with unspecified severity: Secondary | ICD-10-CM

## 2018-06-16 DIAGNOSIS — Z87891 Personal history of nicotine dependence: Secondary | ICD-10-CM

## 2018-06-16 DIAGNOSIS — I340 Nonrheumatic mitral (valve) insufficiency: Secondary | ICD-10-CM

## 2018-06-16 DIAGNOSIS — Z8679 Personal history of other diseases of the circulatory system: Secondary | ICD-10-CM

## 2018-06-16 DIAGNOSIS — Z888 Allergy status to other drugs, medicaments and biological substances status: Secondary | ICD-10-CM

## 2018-06-16 DIAGNOSIS — Z952 Presence of prosthetic heart valve: Secondary | ICD-10-CM

## 2018-06-16 DIAGNOSIS — Z885 Allergy status to narcotic agent status: Secondary | ICD-10-CM

## 2018-06-16 DIAGNOSIS — B954 Other streptococcus as the cause of diseases classified elsewhere: Secondary | ICD-10-CM

## 2018-06-16 DIAGNOSIS — R7881 Bacteremia: Secondary | ICD-10-CM

## 2018-06-16 LAB — PROTIME-INR
INR: 1.4 — ABNORMAL HIGH (ref 0.8–1.2)
Prothrombin Time: 17.2 seconds — ABNORMAL HIGH (ref 11.4–15.2)

## 2018-06-16 LAB — ECHOCARDIOGRAM COMPLETE
HEIGHTINCHES: 61 in
Weight: 2194.02 oz

## 2018-06-16 LAB — BASIC METABOLIC PANEL
Anion gap: 9 (ref 5–15)
BUN: 16 mg/dL (ref 8–23)
CALCIUM: 7.9 mg/dL — AB (ref 8.9–10.3)
CO2: 21 mmol/L — ABNORMAL LOW (ref 22–32)
Chloride: 99 mmol/L (ref 98–111)
Creatinine, Ser: 0.67 mg/dL (ref 0.44–1.00)
GFR calc Af Amer: >60 mL/min (ref >60)
GFR calc non Af Amer: >60 mL/min (ref >60)
Glucose, Bld: 101 mg/dL — ABNORMAL HIGH (ref 70–99)
Potassium: 4.3 mmol/L (ref 3.5–5.1)
Sodium: 129 mmol/L — ABNORMAL LOW (ref 135–145)

## 2018-06-16 LAB — CBC
HCT: 33.2 % — ABNORMAL LOW (ref 36.0–46.0)
Hemoglobin: 10.4 g/dL — ABNORMAL LOW (ref 12.0–15.0)
MCH: 29.7 pg (ref 26.0–34.0)
MCHC: 31.3 g/dL (ref 30.0–36.0)
MCV: 94.9 fL (ref 80.0–100.0)
Platelets: 154 10*3/uL (ref 150–400)
RBC: 3.5 MIL/uL — ABNORMAL LOW (ref 3.87–5.11)
RDW: 13.7 % (ref 11.5–15.5)
WBC: 29.2 10*3/uL — ABNORMAL HIGH (ref 4.0–10.5)
nRBC: 0 % (ref 0.0–0.2)

## 2018-06-16 LAB — URINE CULTURE: Culture: <10000 — AB

## 2018-06-16 MED ORDER — SODIUM CHLORIDE 0.9% FLUSH: 10.0000 mL | INTRAVENOUS | Status: DC | PRN

## 2018-06-16 MED ORDER — SODIUM CHLORIDE 0.9% FLUSH
10.0000 mL | Freq: Two times a day (BID) | INTRAVENOUS | Status: DC
  Administered 2018-06-16 – 2018-06-21 (×6): 10 mL

## 2018-06-16 MED ORDER — WARFARIN SODIUM 6 MG PO TABS
6.0000 mg | ORAL_TABLET | Freq: Once | ORAL | Status: AC
  Administered 2018-06-16: 6 mg via ORAL
  Filled 2018-06-16: qty 1

## 2018-06-16 MED ORDER — ENOXAPARIN SODIUM 40 MG/0.4ML ~~LOC~~ SOLN
40.0000 mg | SUBCUTANEOUS | Status: DC
  Administered 2018-06-16 – 2018-06-18 (×3): 40 mg via SUBCUTANEOUS
  Filled 2018-06-16 (×3): qty 0.4

## 2018-06-16 NOTE — Progress Notes (Signed)
Peripherally Inserted Central Catheter/Midline Placement  The IV Nurse has discussed with the patient and/or persons authorized to consent for the patient, the purpose of this procedure and the potential benefits and risks involved with this procedure.  The benefits include less needle sticks, lab draws from the catheter, and the patient may be discharged home with the catheter. Risks include, but not limited to, infection, bleeding, blood clot (thrombus formation), and puncture of an artery; nerve damage and irregular heartbeat and possibility to perform a PICC exchange if needed/ordered by physician.  Alternatives to this procedure were also discussed.  Bard Power PICC patient education guide, fact sheet on infection prevention and patient information card has been provided to patient /or left at bedside.  Pt agreeable to procedure.  States unable to sign consent due to blindness, requested son to sign.  PICC/Midline Placement Documentation  PICC Single Lumen 06/16/18 PICC Right Brachial 36 cm 0 cm (Active)  Indication for Insertion or Continuance of Line Home intravenous therapies (PICC only) 06/16/2018  5:47 PM  Exposed Catheter (cm) 0 cm 06/16/2018  5:47 PM  Site Assessment Clean;Intact;Bleeding 06/16/2018  5:47 PM  Line Status Flushed;Saline locked;Blood return noted 06/16/2018  5:47 PM  Dressing Type Transparent 06/16/2018  5:47 PM  Dressing Status Clean;Dry;Intact;Antimicrobial disc in place 06/16/2018  5:47 PM  Line Care Connections checked and tightened 06/16/2018  5:47 PM  Line Adjustment (NICU/IV Team Only) No 06/16/2018  5:47 PM  Dressing Intervention New dressing 06/16/2018  5:47 PM  Dressing Change Due 06/18/18 06/16/2018  5:47 PM       Rolena Infante 06/16/2018, 5:48 PM

## 2018-06-16 NOTE — Progress Notes (Signed)
md paged to obtain order for picc line. Iv team informed rn that picc team is very busy and pt will need to be placed on list.

## 2018-06-16 NOTE — Progress Notes (Signed)
Spoke with Dr Linus Salmons re PICC order.  States okay to proceed with PICC placement.

## 2018-06-16 NOTE — Progress Notes (Signed)
  Echocardiogram 2D Echocardiogram has been performed.  Brittany [name] Brittany [name] 06/16/2018, 11:23 AM

## 2018-06-16 NOTE — Consult Note (Signed)
[facility]       Reason for Consult: bacteremia    Referring Physician: Dr. Bonner Puna  Active Problems:   Aortic stenosis   Hypertension   Chronic atrial fibrillation (HCC)   Esophageal reflux   Hyponatremia   Diarrhea   Dementia (HCC)   Chronic ulcer of left foot (HCC)   Sepsis secondary to UTI ([place])   . atorvastatin  20 mg Oral Daily  . busPIRone  15 mg Oral BID  . cycloSPORINE  1 drop Both Eyes BID  . diltiazem  120 mg Oral BID  . enoxaparin (LOVENOX) injection  40 mg Subcutaneous Q24H  . feeding supplement (ENSURE ENLIVE)  237 mL Oral BID BM  . feeding supplement (PRO-STAT SUGAR FREE 64)  30 mL Oral BID  . multivitamin  1 tablet Oral BID  . oxybutynin  5 mg Oral Daily  . pantoprazole  20 mg Oral Daily  . polyvinyl alcohol  1 drop Both Eyes BID  . sodium chloride flush  3 mL Intravenous Once  . warfarin  6 mg Oral ONCE-1800  . Warfarin - Pharmacist Dosing Inpatient   Does not apply q1800    Recommendations: Ceftriaxone for now TEE if TTE does not reveal anything Repeat blood cultures  Assessment: Bacteremia, likely from her worsening foot wound and in the setting of AVR history.  Will need evaluation of valves, as above.  Amoxicillin intolerance - n/v.  No allergy.  Can use ampicillin, though at this time she is having access issues and once a day ceftriaxone optimal at this time.  Will consider narrowing when able.   Antibiotics: Ceftriaxone 2 days  HPI: Brittany [name] is a 85 y.o. female with a history of afeib, chronic foot ulcer, AVR in 2005 who presented with fever, rigors and weakness with worsening drainage of her foot.  Found to have a positive blood culture with Streptococcus G in 2/4 bottles.  On ceftriaxone due to intolerance to amoxicillin.  She states she still feels 'crappy'.  Daughter at bedside.  Getting echo now.  Afebrile > 24 hours, Tmax was 102.3, WBC 33.6 to 29.2.  Currently having issues with access.    Review  of Systems:  Constitutional: negative for fevers and chills Respiratory: negative for cough Gastrointestinal: negative for nausea All other systems reviewed and are negative    Past Medical History:  Diagnosis Date  . Abnormal liver diagnostic imaging    suspected cirrhosis  . Arthritis   . Atrial fibrillation ([place])   . Chronic back pain   . Diverticulosis   . Dyslipidemia    takes Lipitor daily  . Dysrhythmia    Atrial Fibrillation  . Foot drop    SINCE 1987  . GERD (gastroesophageal reflux disease)   . H/O hiatal hernia   . Hearing impaired    Bilateral hearing aids  . History of bacterial endocarditis   . History of gastritis   . History of stomach ulcers    many yrs ago  . History of TIA (transient ischemic attack)    2013-- RESIDUAL PERIPHERAL VISION RIGHT EYE--  RESOLVED  . Hypertension   . IC (interstitial cystitis)   . Macular degeneration   . Pulmonary hypertension ([place]) 10/2013   Based on TTE  . S/P aortic valve replacement    2005  . Urine incontinence     Social History   Tobacco Use  . Smoking status: Former Smoker    Packs/day: 0.50  Years: 40.00    Pack years: 20.00    Types: Cigarettes    Start date: 05/06/1949    Last attempt to quit: 03/06/1990    Years since quitting: 28.2  . Smokeless tobacco: Never Used  Substance Use Topics  . Alcohol use: No    Alcohol/week: 0.0 standard drinks    Comment: Remote social EtOH  . Drug use: No    Family History  Problem Relation Age of Onset  . Heart disease Father   . Stomach cancer Maternal Grandmother   . Esophageal cancer Maternal Grandfather   . Bipolar disorder Son        Committed Suicide  . Coronary artery disease Brother   . Bipolar disorder Brother        commited suiside at 16yo  . Alcohol abuse Sister        sister #1  . Alcohol abuse Sister        sister #2  . Colon cancer Maternal Aunt   . Lung disease Neg Hx     Allergies  Allergen Reactions  . Amlodipine Swelling and  Other (See Comments)     Unspecified swelling reaction  . Amoxicillin Diarrhea, Nausea And Vomiting and Other (See Comments)    Has patient had a PCN reaction causing immediate rash, facial/tongue/throat swelling, SOB or lightheadedness with hypotension: No Has patient had a PCN reaction causing severe rash involving mucus membranes or skin necrosis: No Has patient had a PCN reaction that required hospitalization: No Has patient had a PCN reaction occurring within the last 10 years: No If all of the above answers are "NO", then may proceed with Cephalosporin use.  . Carafate [Sucralfate] Swelling and Other (See Comments)    Reaction:  Knee swelling/redness   . Cephalexin Diarrhea  . Codeine Nausea And Vomiting  . Irbesartan Swelling and Other (See Comments)    Reaction:  Facial/hand swelling and numbness   . Morphine And Related Other (See Comments)    Pt states that she has a history of addiction with this medication.      . Neurontin [Gabapentin] Swelling and Other (See Comments)    Reaction:  Leg swelling   . Nitrofurantoin Monohyd Macro Diarrhea and Nausea And Vomiting  . Prednisone Other (See Comments)    Reaction:  Elevated BP  . Sulfa Antibiotics Rash    Physical Exam: Constitutional: in no apparent distress  Vitals:   06/16/18 0412 06/16/18 0413  BP: (!) 156/82 (!) 156/82  Pulse: (!) 103 91  Resp:    Temp: 98.3 F (36.8 C) 98.3 F (36.8 C)  SpO2: 97% 97%   EYES: anicteric ENMT: no thrush Cardiovascular: Cor RRR Respiratory: CTA B; normal respiratory effort GI: soft Musculoskeletal: no pedal edema noted; warm left leg, wound open on foot, no drainage now.   Skin: negatives: no rash Neuro: non focal  Lab Results  Component Value Date   WBC 29.2 (H) 06/16/2018   HGB 10.4 (L) 06/16/2018   HCT 33.2 (L) 06/16/2018   MCV 94.9 06/16/2018   PLT 154 06/16/2018    Lab Results  Component Value Date   CREATININE 0.67 06/16/2018   BUN 16 06/16/2018   NA 129 (L)  06/16/2018   K 4.3 06/16/2018   CL 99 06/16/2018   CO2 21 (L) 06/16/2018    Lab Results  Component Value Date   ALT 26 06/14/2018   AST 31 06/14/2018   ALKPHOS 90 06/14/2018     Microbiology: Recent Results (from  the past 240 hour(s))  Culture, blood (Routine x 2)     Status: Abnormal (Preliminary result)   Collection Time: 06/14/18  9:50 AM  Result Value Ref Range Status   Specimen Description   Final    BLOOD RIGHT ANTECUBITAL Performed at [facility] [address], [place], [place] [address]    Special Requests   Final    BOTTLES DRAWN AEROBIC AND ANAEROBIC Blood Culture results may not be optimal due to an excessive volume of blood received in culture bottles Performed at [facility] [address] [place], [place] [address]    Culture  Setup Time   Final    GRAM POSITIVE COCCI IN BOTH AEROBIC AND ANAEROBIC BOTTLES CRITICAL RESULT CALLED TO, READ BACK BY AND VERIFIED WITH: B GREEN PHARMD 06/14/18 2321 JDW Performed at [facility], [place] [address], [place], [place] [address]    Culture STREPTOCOCCUS GROUP G (A)  Final   Report Status PENDING  Incomplete   Organism ID, Bacteria STREPTOCOCCUS GROUP G  Final      Susceptibility   Streptococcus group g - MIC*    CLINDAMYCIN <=0.25 SENSITIVE Sensitive     AMPICILLIN <=0.25 SENSITIVE Sensitive     ERYTHROMYCIN <=0.12 SENSITIVE Sensitive     VANCOMYCIN <=0.12 SENSITIVE Sensitive     CEFTRIAXONE <=0.12 SENSITIVE Sensitive     LEVOFLOXACIN 0.5 SENSITIVE Sensitive     * STREPTOCOCCUS GROUP G  Blood Culture ID Panel (Reflexed)     Status: Abnormal   Collection Time: 06/14/18  9:50 AM  Result Value Ref Range Status   Enterococcus species NOT DETECTED NOT DETECTED Final   Listeria monocytogenes NOT DETECTED NOT DETECTED Final   Staphylococcus species NOT DETECTED NOT DETECTED Final   Staphylococcus aureus (BCID) NOT DETECTED NOT DETECTED Final   Streptococcus species DETECTED (A)  NOT DETECTED Final    Comment: Not Enterococcus species, Streptococcus agalactiae, Streptococcus pyogenes, or Streptococcus pneumoniae. CRITICAL RESULT CALLED TO, READ BACK BY AND VERIFIED WITH: B GREEN PHARMD 06/14/18 2321 JDW    Streptococcus agalactiae NOT DETECTED NOT DETECTED Final   Streptococcus pneumoniae NOT DETECTED NOT DETECTED Final   Streptococcus pyogenes NOT DETECTED NOT DETECTED Final   Acinetobacter baumannii NOT DETECTED NOT DETECTED Final   Enterobacteriaceae species NOT DETECTED NOT DETECTED Final   Enterobacter cloacae complex NOT DETECTED NOT DETECTED Final   Escherichia coli NOT DETECTED NOT DETECTED Final   Klebsiella oxytoca NOT DETECTED NOT DETECTED Final   Klebsiella pneumoniae NOT DETECTED NOT DETECTED Final   Proteus species NOT DETECTED NOT DETECTED Final   Serratia marcescens NOT DETECTED NOT DETECTED Final   Haemophilus influenzae NOT DETECTED NOT DETECTED Final   Neisseria meningitidis NOT DETECTED NOT DETECTED Final   Pseudomonas aeruginosa NOT DETECTED NOT DETECTED Final   Candida albicans NOT DETECTED NOT DETECTED Final   Candida glabrata NOT DETECTED NOT DETECTED Final   Candida krusei NOT DETECTED NOT DETECTED Final   Candida parapsilosis NOT DETECTED NOT DETECTED Final   Candida tropicalis NOT DETECTED NOT DETECTED Final    Comment: Performed at [facility], [place] [address] [place], [place] [address]  Culture, Urine     Status: Abnormal   Collection Time: 06/14/18 11:17 AM  Result Value Ref Range Status   Specimen Description   Final    URINE, RANDOM Performed at [facility] [address], [place], [place] [address]    Special Requests   Final  NONE Performed at [facility], [place] [address] [place] [address] [place], [place] [address]    Culture (A)  Final    <10,000 COLONIES/mL INSIGNIFICANT GROWTH Performed at [facility] [address], [place], [place] [address]    Report Status 06/16/2018 FINAL   Final  Culture, blood (Routine x 2)     Status: None (Preliminary result)   Collection Time: 06/14/18 11:18 AM  Result Value Ref Range Status   Specimen Description   Final    BLOOD RIGHT HAND Performed at [facility] [address] [place], [place] [address]    Special Requests   Final    BOTTLES DRAWN AEROBIC ONLY Blood Culture results may not be optimal due to an inadequate volume of blood received in culture bottles Performed at [facility] [address], [place], [place] [address]    Culture   Final    NO GROWTH 2 DAYS Performed at [facility] [address], [place], [place] [address]    Report Status PENDING  Incomplete  MRSA PCR Screening     Status: None   Collection Time: 06/14/18  5:52 PM  Result Value Ref Range Status   MRSA by PCR NEGATIVE NEGATIVE Final    Comment:        The GeneXpert MRSA Assay (FDA approved for NASAL specimens only), is one component of a comprehensive MRSA colonization surveillance program. It is not intended to diagnose MRSA infection nor to guide or monitor treatment for MRSA infections. Performed at [facility], [place] [address], [place],  [address]   C difficile quick scan w PCR reflex     Status: None   Collection Time: 06/15/18  5:35 AM  Result Value Ref Range Status   C Diff antigen NEGATIVE NEGATIVE Final   C Diff toxin NEGATIVE NEGATIVE Final   C Diff interpretation No C. difficile detected.  Final    Comment: Performed at [facility], [place] [address] [place],  [address]     W , MD [facility] [email or web address] 06/16/2018, 11:45 AM

## 2018-06-16 NOTE — Progress Notes (Signed)
PROGRESS NOTE  Brittany DRAKES  TKZ:[id number] DOB: [birth year] DOA: 06/14/2018 PCP: Mayra Neer, MD   Brief Narrative: Brittany [name] is an 85 y.o. female with a history of cirrhosis, chronic AFib, chronic back pain, HTN, gastritis/GERD, pulmonary HTN, AVR 2005 on coumadin, and bacterial endocarditis who presented from ALF with fever, weakness, and an unwitnessed fall. She was found to be febrile to 102.34F, tachypneic but not hypoxic. WBC 19k, INR 1.9. CXR demonstrated stable cardiomegaly and pleural effusions. Flu negative. She's had a chronic left foot ulcer under 3x weekly treatment by wound care RN and recently was noted to have malodorous discharge, now with increasing erythema. XR of the foot did not reveal osteomyelitis. Urinalysis was pending at admission, though urinary source was suspected based on history of recurrent UTIs. She denies diarrhea, though CDiff was checked and found to be negative. Vancomycin and cefepime were started after blood cultures were drawn. Cultures have grown group G strep. Antibiotics narrowed to ceftriaxone. WBC rose from 19k to 33.6k though thus far the patient appears nontoxic and had defervesced.   Assessment & Plan: Active Problems:   Aortic stenosis   Hypertension   Chronic atrial fibrillation (HCC)   Esophageal reflux   Hyponatremia   Diarrhea   Dementia (HCC)   Chronic ulcer of left foot (HCC)   Sepsis secondary to UTI ([place])  Sepsis due to group G strep bacteremia, source suspected to be left foot ulcer with cellulitis: - Leukocytosis modestly improved, afebrile >24hrs, initial repeat. Will insert PICC for poor IV access and need for prolonged IV abx. Repeat blood cultures ordered. Appreciate ID consultation.  - TTE without vegetation, will get TEE.  - Has RF's for joint seeding (mostly age, OA), will monitor for symptoms of septic arthritis.  - Continue ceftriaxone 2g q24h. Note allergies to amoxicillin, keflex, sulfa, and macrobid. May still  be able to transition to amox per ID. - WOC consulted for local wound care. Does not appear to require surgical debridement at this time.  Chronic atrial fibrillation:  - Rate is controlled, continue diltiazem - Continue coumadin as below  Chronic diastolic CHF: After IVF's given, having some edema and dilated IVC on echo. Echo April 2019 w/EF 57-32%, diastolic function not mentioned due to AFib. - Stop IVF.  History of brioprosthetic AVR: Mild stenosis on most recent echo. - Continue coumadin with INR goal 2-3, dosing per pharmacy. Will give lovenox while INR subtherapeutic.  Pulmonary HTN:  - Echo as above  Left foot drop: s/p orthopedic procedure 1987.  - Fall precautions  Fall at home:  - Fall precautions, PT, OT eval to start 3/9  Dyslipidemia:  - Continue statin  GERD:  - PPI  [place] 4/14  DVT prophylaxis: Coumadin Code Status: DNR Family Communication: Daughter at bedside Disposition Plan: Anticipate return to ALF once clinically stabilizing, may need higher level of care if grows more weak during hospitalization.  Consultants:   ID  Procedures:   Echocardiogram  Antimicrobials:  Vancomycin 3/5  Cefepime 3/5  Ceftriaxone 3/6 >>    Subjective: No complaints, but does blame initial wound on dermatology PA after skin lesion removed in Summer 2019. No fevers, chills, cough, dyspnea, chest pain, abd pain, N/V/D.    Objective: Vitals:   06/15/18 1952 06/16/18 0412 06/16/18 0413 06/16/18 1440  BP: (!) 101/54 (!) 156/82 (!) 156/82 120/61  Pulse: 78 (!) 103 91 82  Resp: 15     Temp: 98 F (36.7 C) 98.3 F (36.8 C) 98.3  F (36.8 C) 98.2 F (36.8 C)  TempSrc: Oral Oral Oral   SpO2: 96% 97% 97% 98%  Weight:      Height:        Intake/Output Summary (Last 24 hours) at 06/16/2018 1542 Last data filed at 06/16/2018 1325 Gross per 24 hour  Intake 2716.28 ml  Output 2850 ml  Net -133.72 ml   Filed Weights   06/14/18 0947 06/14/18 1609 06/15/18 1306    Weight: 47.6 kg 47.6 kg 62.2 kg   Gen: Elderly, pleasant female in no distress Pulm: Nonlabored breathing room air. Decreased at bases. CV: Irreg irreg. I/VI systolic murmur, no rub, or gallop. No JVD, trace dependent edema. GI: Abdomen soft, non-tender, non-distended, with normoactive bowel sounds.  Ext: Warm, no deformities Skin: No new rashes, lesions or ulcers on visualized skin. Left foot wound dressing c/d/i, malodorous. Neuro: Alert and oriented. No focal neurological deficits. Psych: Judgement and insight appear fair. Mood euthymic & affect congruent. Behavior is appropriate.    Data Reviewed: I have personally reviewed following labs and imaging studies  CBC: Recent Labs  Lab 06/14/18 0950 06/15/18 0614 06/16/18 0347  WBC 19.0* 33.6* 29.2*  NEUTROABS 17.6*  --   --   HGB 13.3 10.2* 10.4*  HCT 40.4 31.8* 33.2*  MCV 91.4 93.3 94.9  PLT 256 166 527   Basic Metabolic Panel: Recent Labs  Lab 06/14/18 0950 06/15/18 0453 06/16/18 0347  NA 130* 128* 129*  K 4.1 4.4 4.3  CL 95* 100 99  CO2 25 20* 21*  GLUCOSE 91 94 101*  BUN 18 18 16   CREATININE 0.78 0.89 0.67  CALCIUM 8.9 7.3* 7.9*   GFR: Estimated Creatinine Clearance: 40.3 mL/min (by C-G formula based on SCr of 0.67 mg/dL). Liver Function Tests: Recent Labs  Lab 06/14/18 0950  AST 31  ALT 26  ALKPHOS 90  BILITOT 1.2  PROT 8.2*  ALBUMIN 3.8   No results for input(s): LIPASE, AMYLASE in the last 168 hours. No results for input(s): AMMONIA in the last 168 hours. Coagulation Profile: Recent Labs  Lab 06/14/18 0950 06/15/18 0453 06/16/18 0347  INR 1.9* 2.1* 1.4*   Cardiac Enzymes: No results for input(s): CKTOTAL, CKMB, CKMBINDEX, TROPONINI in the last 168 hours. BNP (last 3 results) No results for input(s): PROBNP in the last 8760 hours. HbA1C: No results for input(s): HGBA1C in the last 72 hours. CBG: No results for input(s): GLUCAP in the last 168 hours. Lipid Profile: No results for  input(s): CHOL, HDL, LDLCALC, TRIG, CHOLHDL, LDLDIRECT in the last 72 hours. Thyroid Function Tests: No results for input(s): TSH, T4TOTAL, FREET4, T3FREE, THYROIDAB in the last 72 hours. Anemia Panel: No results for input(s): VITAMINB12, FOLATE, FERRITIN, TIBC, IRON, RETICCTPCT in the last 72 hours. Urine analysis:    Component Value Date/Time   COLORURINE YELLOW 06/14/2018 1117   APPEARANCEUR CLEAR 06/14/2018 1117   LABSPEC 1.010 06/14/2018 1117   PHURINE 7.0 06/14/2018 1117   GLUCOSEU NEGATIVE 06/14/2018 1117   GLUCOSEU NEGATIVE 06/25/2014 1201   HGBUR MODERATE (A) 06/14/2018 1117   BILIRUBINUR NEGATIVE 06/14/2018 1117   KETONESUR NEGATIVE 06/14/2018 1117   PROTEINUR NEGATIVE 06/14/2018 1117   UROBILINOGEN 0.2 08/27/2014 1954   NITRITE NEGATIVE 06/14/2018 1117   LEUKOCYTESUR NEGATIVE 06/14/2018 1117   Recent Results (from the past 240 hour(s))  Culture, blood (Routine x 2)     Status: Abnormal (Preliminary result)   Collection Time: 06/14/18  9:50 AM  Result Value Ref Range Status  Specimen Description   Final    BLOOD RIGHT ANTECUBITAL Performed at [facility] [address], [place], [place] [address]    Special Requests   Final    BOTTLES DRAWN AEROBIC AND ANAEROBIC Blood Culture results may not be optimal due to an excessive volume of blood received in culture bottles Performed at [facility] [address], [place], [place] [address]    Culture  Setup Time   Final    GRAM POSITIVE COCCI IN BOTH AEROBIC AND ANAEROBIC BOTTLES CRITICAL RESULT CALLED TO, READ BACK BY AND VERIFIED WITH: B GREEN PHARMD 06/14/18 2321 JDW Performed at [facility], [place] [address] [place], [place] [address]    Culture STREPTOCOCCUS GROUP G (A)  Final   Report Status PENDING  Incomplete   Organism ID, Bacteria STREPTOCOCCUS GROUP G  Final      Susceptibility   Streptococcus group g - MIC*    CLINDAMYCIN <=0.25 SENSITIVE Sensitive      AMPICILLIN <=0.25 SENSITIVE Sensitive     ERYTHROMYCIN <=0.12 SENSITIVE Sensitive     VANCOMYCIN <=0.12 SENSITIVE Sensitive     CEFTRIAXONE <=0.12 SENSITIVE Sensitive     LEVOFLOXACIN 0.5 SENSITIVE Sensitive     * STREPTOCOCCUS GROUP G  Blood Culture ID Panel (Reflexed)     Status: Abnormal   Collection Time: 06/14/18  9:50 AM  Result Value Ref Range Status   Enterococcus species NOT DETECTED NOT DETECTED Final   Listeria monocytogenes NOT DETECTED NOT DETECTED Final   Staphylococcus species NOT DETECTED NOT DETECTED Final   Staphylococcus aureus (BCID) NOT DETECTED NOT DETECTED Final   Streptococcus species DETECTED (A) NOT DETECTED Final    Comment: Not Enterococcus species, Streptococcus agalactiae, Streptococcus pyogenes, or Streptococcus pneumoniae. CRITICAL RESULT CALLED TO, READ BACK BY AND VERIFIED WITH: B GREEN PHARMD 06/14/18 2321 JDW    Streptococcus agalactiae NOT DETECTED NOT DETECTED Final   Streptococcus pneumoniae NOT DETECTED NOT DETECTED Final   Streptococcus pyogenes NOT DETECTED NOT DETECTED Final   Acinetobacter baumannii NOT DETECTED NOT DETECTED Final   Enterobacteriaceae species NOT DETECTED NOT DETECTED Final   Enterobacter cloacae complex NOT DETECTED NOT DETECTED Final   Escherichia coli NOT DETECTED NOT DETECTED Final   Klebsiella oxytoca NOT DETECTED NOT DETECTED Final   Klebsiella pneumoniae NOT DETECTED NOT DETECTED Final   Proteus species NOT DETECTED NOT DETECTED Final   Serratia marcescens NOT DETECTED NOT DETECTED Final   Haemophilus influenzae NOT DETECTED NOT DETECTED Final   Neisseria meningitidis NOT DETECTED NOT DETECTED Final   Pseudomonas aeruginosa NOT DETECTED NOT DETECTED Final   Candida albicans NOT DETECTED NOT DETECTED Final   Candida glabrata NOT DETECTED NOT DETECTED Final   Candida krusei NOT DETECTED NOT DETECTED Final   Candida parapsilosis NOT DETECTED NOT DETECTED Final   Candida tropicalis NOT DETECTED NOT DETECTED Final     Comment: Performed at [facility], [place] [address] [place], [place] [address]  Culture, Urine     Status: Abnormal   Collection Time: 06/14/18 11:17 AM  Result Value Ref Range Status   Specimen Description   Final    URINE, RANDOM Performed at [place] [address] [place], [place] [address]    Special Requests   Final    NONE Performed at [facility], [place] [address], [place], [place] [address]    Culture (A)  Final    <10,000 COLONIES/mL INSIGNIFICANT GROWTH Performed at [facility], [place] [address] [place], [place] [address]    Report Status 06/16/2018 FINAL  Final  Culture, blood (Routine x 2)     Status: None (Preliminary result)   Collection Time: 06/14/18 11:18 AM  Result Value Ref Range Status   Specimen Description   Final    BLOOD RIGHT HAND Performed at [facility] [address] [place], [place] [address]    Special Requests   Final    BOTTLES DRAWN AEROBIC ONLY Blood Culture results may not be optimal due to an inadequate volume of blood received in culture bottles Performed at [facility] [address] [place], [place] [address]    Culture   Final    NO GROWTH 2 DAYS Performed at [facility] [address] [place], [place] [address]    Report Status PENDING  Incomplete  MRSA PCR Screening     Status: None   Collection Time: 06/14/18  5:52 PM  Result Value Ref Range Status   MRSA by PCR NEGATIVE NEGATIVE Final    Comment:        The GeneXpert MRSA Assay (FDA approved for NASAL specimens only), is one component of a comprehensive MRSA colonization surveillance program. It is not intended to diagnose MRSA infection nor to guide or monitor treatment for MRSA infections. Performed at [facility], [place] [address], [place], [place] [address]   C difficile quick scan w PCR reflex     Status: None   Collection Time: 06/15/18  5:35  AM  Result Value Ref Range Status   C Diff antigen NEGATIVE NEGATIVE Final   C Diff toxin NEGATIVE NEGATIVE Final   C Diff interpretation No C. difficile detected.  Final    Comment: Performed at [facility], [place] [address] [place], [place] [address]      Radiology Studies: [place] Ekg Site Rite  Result Date: 06/16/2018 If [facility] image not attached, placement could not be confirmed due to current cardiac rhythm.   Scheduled Meds: . atorvastatin  20 mg Oral Daily  . busPIRone  15 mg Oral BID  . cycloSPORINE  1 drop Both Eyes BID  . diltiazem  120 mg Oral BID  . enoxaparin (LOVENOX) injection  40 mg Subcutaneous Q24H  . feeding supplement (ENSURE ENLIVE)  237 mL Oral BID BM  . feeding supplement (PRO-STAT SUGAR FREE 64)  30 mL Oral BID  . multivitamin  1 tablet Oral BID  . oxybutynin  5 mg Oral Daily  . pantoprazole  20 mg Oral Daily  . polyvinyl alcohol  1 drop Both Eyes BID  . sodium chloride flush  3 mL Intravenous Once  . warfarin  6 mg Oral ONCE-1800  . Warfarin - Pharmacist Dosing Inpatient   Does not apply q1800   Continuous Infusions: . cefTRIAXone (ROCEPHIN)  IV Stopped (06/16/18 1246)     LOS: 1 day   Time spent: 35 minutes.  Patrecia Pour, MD [facility] [email or web address] Password [facility] 06/16/2018, 3:42 PM

## 2018-06-16 NOTE — Progress Notes (Signed)
PT Cancellation Note  Patient Details Name: Brittany [name] MRN: [id number] DOB: 03/27/1928   Cancelled Treatment:    Reason Eval/Treat Not Completed: Medical issues which prohibited therapy; Hold PT today per RN--Dr. Bonner Puna would like PT to wait until next day; will continue efforts to complete eval as schedule permits   [facility] 06/16/2018, 11:48 AM

## 2018-06-16 NOTE — Progress Notes (Signed)
[place] for warfarin Indication: atrial fibrillation and aortic valve replacement  Allergies  Allergen Reactions  . Amlodipine Swelling and Other (See Comments)     Unspecified swelling reaction  . Amoxicillin Diarrhea, Nausea And Vomiting and Other (See Comments)    Has patient had a PCN reaction causing immediate rash, facial/tongue/throat swelling, SOB or lightheadedness with hypotension: No Has patient had a PCN reaction causing severe rash involving mucus membranes or skin necrosis: No Has patient had a PCN reaction that required hospitalization: No Has patient had a PCN reaction occurring within the last 10 years: No If all of the above answers are "NO", then may proceed with Cephalosporin use.  . Carafate [Sucralfate] Swelling and Other (See Comments)    Reaction:  Knee swelling/redness   . Cephalexin Diarrhea  . Codeine Nausea And Vomiting  . Irbesartan Swelling and Other (See Comments)    Reaction:  Facial/hand swelling and numbness   . Morphine And Related Other (See Comments)    Pt states that she has a history of addiction with this medication.      . Neurontin [Gabapentin] Swelling and Other (See Comments)    Reaction:  Leg swelling   . Nitrofurantoin Monohyd Macro Diarrhea and Nausea And Vomiting  . Prednisone Other (See Comments)    Reaction:  Elevated BP  . Sulfa Antibiotics Rash    Patient Measurements: Height: 5\' 1"  (154.9 cm) Weight: 137 lb 2 oz (62.2 kg) IBW/kg (Calculated) : 47.8  Vital Signs: Temp: 98.3 F (36.8 C) (03/07 0413) Temp Source: Oral (03/07 0413) BP: 156/82 (03/07 0413) Pulse Rate: 91 (03/07 0413)  Labs: Recent Labs    06/14/18 0950 06/15/18 0453 06/15/18 0614 06/16/18 0347  HGB 13.3  --  10.2* 10.4*  HCT 40.4  --  31.8* 33.2*  PLT 256  --  166 154  LABPROT 21.9* 23.1*  --  17.2*  INR 1.9* 2.1*  --  1.4*  CREATININE 0.78 0.89  --  0.67    Estimated Creatinine Clearance: 40.3 mL/min (by  C-G formula based on SCr of 0.67 mg/dL).   Medical History: Past Medical History:  Diagnosis Date  . Abnormal liver diagnostic imaging    suspected cirrhosis  . Arthritis   . Atrial fibrillation ([place])   . Chronic back pain   . Diverticulosis   . Dyslipidemia    takes Lipitor daily  . Dysrhythmia    Atrial Fibrillation  . Foot drop    SINCE 1987  . GERD (gastroesophageal reflux disease)   . H/O hiatal hernia   . Hearing impaired    Bilateral hearing aids  . History of bacterial endocarditis   . History of gastritis   . History of stomach ulcers    many yrs ago  . History of TIA (transient ischemic attack)    2013-- RESIDUAL PERIPHERAL VISION RIGHT EYE--  RESOLVED  . Hypertension   . IC (interstitial cystitis)   . Macular degeneration   . Pulmonary hypertension ([place]) 10/2013   Based on TTE  . S/P aortic valve replacement    2005  . Urine incontinence     Medications:  Scheduled:  . atorvastatin  20 mg Oral Daily  . busPIRone  15 mg Oral BID  . cycloSPORINE  1 drop Both Eyes BID  . diltiazem  120 mg Oral BID  . feeding supplement (ENSURE ENLIVE)  237 mL Oral BID BM  . feeding supplement (PRO-STAT SUGAR FREE 64)  30 mL  Oral BID  . multivitamin  1 tablet Oral BID  . oxybutynin  5 mg Oral Daily  . pantoprazole  20 mg Oral Daily  . polyvinyl alcohol  1 drop Both Eyes BID  . sodium chloride flush  3 mL Intravenous Once  . Warfarin - Pharmacist Dosing Inpatient   Does not apply q1800   Infusions:  . cefTRIAXone (ROCEPHIN)  IV Stopped (06/15/18 0559)    Assessment: 85 yo female presented to ER after fall with fever and tachycardia. Pt takes warfarin PTA for atrial fibrillation and history of aortic valve replacement. Pharmacy consulted to dose/monitor warfarin during admission.  Home dose/INR goal (confirmed with outpatient clinic note from 06/08/18): Dose: Warfarin 2.5 mg on Tuesday & Sat; 5 mg on all other days of the week  Goal: INR 2-3  INR 1.9 therapeutic on  admission. LD PTA 3/4   Today, 06/16/18  INR 1.4 is sub-therapeutic  Hgb 10.4 decreased from admission. Pt had nose bleed overnight, stated this is a common occurrence  Plt - WNL  No significant drug interactions noted. Pt is on antibiotics.  Warfarin dose 3/5 not charted as being given.  Goal of Therapy:  INR 2-3   Plan:   Warfarin 6 mg PO once this evening  Resume enoxaparin 40mg  SQ once daily til INR therapeutic (d/w MD)  INR daily  Recheck CBC with AM labs tomorrow  Closely monitor for signs/symptoms of bleeding or thrombosis  Dolly Rias RPh 06/16/2018, 8:45 AM Pager [phone]

## 2018-06-16 NOTE — Plan of Care (Signed)
Pt stable at this time. No changes needed to care plans overall. Pt remains weak and tired overall with poor po intake.

## 2018-06-16 NOTE — Progress Notes (Signed)
Pt up to attempt transfer to [facility] for void.  Pt's nose began to bleed. Stopped after pt held washcloth to nose. She stated that this happens fairly frequently.

## 2018-06-17 DIAGNOSIS — X58XXXA Exposure to other specified factors, initial encounter: Secondary | ICD-10-CM

## 2018-06-17 DIAGNOSIS — I482 Chronic atrial fibrillation, unspecified: Secondary | ICD-10-CM

## 2018-06-17 DIAGNOSIS — A419 Sepsis, unspecified organism: Secondary | ICD-10-CM

## 2018-06-17 DIAGNOSIS — I350 Nonrheumatic aortic (valve) stenosis: Secondary | ICD-10-CM

## 2018-06-17 DIAGNOSIS — S91302A Unspecified open wound, left foot, initial encounter: Secondary | ICD-10-CM

## 2018-06-17 DIAGNOSIS — L539 Erythematous condition, unspecified: Secondary | ICD-10-CM

## 2018-06-17 DIAGNOSIS — R0789 Other chest pain: Secondary | ICD-10-CM

## 2018-06-17 LAB — CULTURE, BLOOD (ROUTINE X 2)

## 2018-06-17 LAB — CBC
HCT: 28.9 % — ABNORMAL LOW (ref 36.0–46.0)
Hemoglobin: 9.2 g/dL — ABNORMAL LOW (ref 12.0–15.0)
MCH: 29.2 pg (ref 26.0–34.0)
MCHC: 31.8 g/dL (ref 30.0–36.0)
MCV: 91.7 fL (ref 80.0–100.0)
Platelets: 147 10*3/uL — ABNORMAL LOW (ref 150–400)
RBC: 3.15 MIL/uL — ABNORMAL LOW (ref 3.87–5.11)
RDW: 13.6 % (ref 11.5–15.5)
WBC: 18.6 10*3/uL — ABNORMAL HIGH (ref 4.0–10.5)
nRBC: 0 % (ref 0.0–0.2)

## 2018-06-17 LAB — COMPREHENSIVE METABOLIC PANEL
ALT: 22 U/L (ref 0–44)
AST: 28 U/L (ref 15–41)
Albumin: 2.2 g/dL — ABNORMAL LOW (ref 3.5–5.0)
Alkaline Phosphatase: 96 U/L (ref 38–126)
Anion gap: 5 (ref 5–15)
BUN: 16 mg/dL (ref 8–23)
CO2: 27 mmol/L (ref 22–32)
Calcium: 7.6 mg/dL — ABNORMAL LOW (ref 8.9–10.3)
Chloride: 96 mmol/L — ABNORMAL LOW (ref 98–111)
Creatinine, Ser: 0.56 mg/dL (ref 0.44–1.00)
GFR calc Af Amer: >60 mL/min (ref >60)
GFR calc non Af Amer: >60 mL/min (ref >60)
Glucose, Bld: 98 mg/dL (ref 70–99)
Potassium: 4.3 mmol/L (ref 3.5–5.1)
Sodium: 128 mmol/L — ABNORMAL LOW (ref 135–145)
Total Bilirubin: 0.3 mg/dL (ref 0.3–1.2)
Total Protein: 5.6 g/dL — ABNORMAL LOW (ref 6.5–8.1)

## 2018-06-17 LAB — TROPONIN I
TROPONIN I: 0.1 ng/mL — AB (ref <0.03)
Troponin I: 0.1 ng/mL (ref <0.03)
Troponin I: 0.09 ng/mL (ref <0.03)

## 2018-06-17 LAB — LACTIC ACID, PLASMA
LACTIC ACID, VENOUS: 1 mmol/L (ref 0.5–1.9)
Lactic Acid, Venous: 1.1 mmol/L (ref 0.5–1.9)

## 2018-06-17 LAB — PROTIME-INR
INR: 1.8 — ABNORMAL HIGH (ref 0.8–1.2)
Prothrombin Time: 20.2 seconds — ABNORMAL HIGH (ref 11.4–15.2)

## 2018-06-17 MED ORDER — PROMETHAZINE HCL 25 MG/ML IJ SOLN
6.2500 mg | Freq: Four times a day (QID) | INTRAMUSCULAR | Status: DC | PRN
  Administered 2018-06-17 – 2018-06-21 (×4): 6.25 mg via INTRAVENOUS
  Filled 2018-06-17 (×4): qty 1

## 2018-06-17 MED ORDER — WARFARIN SODIUM 6 MG PO TABS
6.0000 mg | ORAL_TABLET | Freq: Once | ORAL | Status: AC
  Administered 2018-06-17: 6 mg via ORAL
  Filled 2018-06-17: qty 1

## 2018-06-17 MED ORDER — MORPHINE SULFATE (PF) 2 MG/ML IV SOLN
2.0000 mg | INTRAVENOUS | Status: DC | PRN
  Administered 2018-06-17 – 2018-06-21 (×3): 2 mg via INTRAVENOUS
  Filled 2018-06-17 (×3): qty 1

## 2018-06-17 MED ORDER — ALUM & MAG HYDROXIDE-SIMETH 200-200-20 MG/5ML PO SUSP: 30.0000 mL | Freq: Four times a day (QID) | ORAL | Status: DC | PRN

## 2018-06-17 MED ORDER — MORPHINE SULFATE (PF) 2 MG/ML IV SOLN
2.0000 mg | Freq: Once | INTRAVENOUS | Status: AC
  Administered 2018-06-17: 2 mg via INTRAVENOUS
  Filled 2018-06-17: qty 1

## 2018-06-17 MED ORDER — TORSEMIDE 20 MG PO TABS
20.0000 mg | ORAL_TABLET | Freq: Once | ORAL | Status: AC
  Administered 2018-06-17: 20 mg via ORAL
  Filled 2018-06-17: qty 1

## 2018-06-17 NOTE — Progress Notes (Signed)
[place] for warfarin Indication: atrial fibrillation and aortic valve replacement  Allergies  Allergen Reactions  . Amlodipine Swelling and Other (See Comments)     Unspecified swelling reaction  . Amoxicillin Diarrhea, Nausea And Vomiting and Other (See Comments)    Has patient had a PCN reaction causing immediate rash, facial/tongue/throat swelling, SOB or lightheadedness with hypotension: No Has patient had a PCN reaction causing severe rash involving mucus membranes or skin necrosis: No Has patient had a PCN reaction that required hospitalization: No Has patient had a PCN reaction occurring within the last 10 years: No If all of the above answers are "NO", then may proceed with Cephalosporin use.  . Carafate [Sucralfate] Swelling and Other (See Comments)    Reaction:  Knee swelling/redness   . Cephalexin Diarrhea  . Codeine Nausea And Vomiting  . Irbesartan Swelling and Other (See Comments)    Reaction:  Facial/hand swelling and numbness   . Morphine And Related Other (See Comments)    Pt states that she has a history of addiction with this medication.      . Neurontin [Gabapentin] Swelling and Other (See Comments)    Reaction:  Leg swelling   . Nitrofurantoin Monohyd Macro Diarrhea and Nausea And Vomiting  . Prednisone Other (See Comments)    Reaction:  Elevated BP  . Sulfa Antibiotics Rash    Patient Measurements: Height: 5\' 1"  (154.9 cm) Weight: 137 lb 2 oz (62.2 kg) IBW/kg (Calculated) : 47.8  Vital Signs: Temp: 98.8 F (37.1 C) (03/08 0838) Temp Source: Oral (03/08 0838) BP: 135/66 (03/08 1007) Pulse Rate: 77 (03/08 1007)  Labs: Recent Labs    06/15/18 0453 06/15/18 6606 06/16/18 0347 06/17/18 0403 06/17/18 0752 06/17/18 0823  HGB  --  10.2* 10.4*  --   --  9.2*  HCT  --  31.8* 33.2*  --   --  28.9*  PLT  --  166 154  --   --  147*  LABPROT 23.1*  --  17.2* 20.2*  --   --   INR 2.1*  --  1.4* 1.8*  --   --    CREATININE 0.89  --  0.67  --  0.56  --   TROPONINI  --   --   --   --  0.10*  --     Estimated Creatinine Clearance: 40.3 mL/min (by C-G formula based on SCr of 0.56 mg/dL).   Medical History: Past Medical History:  Diagnosis Date  . Abnormal liver diagnostic imaging    suspected cirrhosis  . Arthritis   . Atrial fibrillation ([place])   . Chronic back pain   . Diverticulosis   . Dyslipidemia    takes Lipitor daily  . Dysrhythmia    Atrial Fibrillation  . Foot drop    SINCE 1987  . GERD (gastroesophageal reflux disease)   . H/O hiatal hernia   . Hearing impaired    Bilateral hearing aids  . History of bacterial endocarditis   . History of gastritis   . History of stomach ulcers    many yrs ago  . History of TIA (transient ischemic attack)    2013-- RESIDUAL PERIPHERAL VISION RIGHT EYE--  RESOLVED  . Hypertension   . IC (interstitial cystitis)   . Macular degeneration   . Pulmonary hypertension ([place]) 10/2013   Based on TTE  . S/P aortic valve replacement    2005  . Urine incontinence     Medications:  Scheduled:  . atorvastatin  20 mg Oral Daily  . busPIRone  15 mg Oral BID  . cycloSPORINE  1 drop Both Eyes BID  . diltiazem  120 mg Oral BID  . enoxaparin (LOVENOX) injection  40 mg Subcutaneous Q24H  . feeding supplement (ENSURE ENLIVE)  237 mL Oral BID BM  . feeding supplement (PRO-STAT SUGAR FREE 64)  30 mL Oral BID  . multivitamin  1 tablet Oral BID  . oxybutynin  5 mg Oral Daily  . pantoprazole  20 mg Oral Daily  . polyvinyl alcohol  1 drop Both Eyes BID  . sodium chloride flush  10-40 mL Intracatheter Q12H  . sodium chloride flush  3 mL Intravenous Once  . torsemide  20 mg Oral Once  . Warfarin - Pharmacist Dosing Inpatient   Does not apply q1800   Infusions:  . cefTRIAXone (ROCEPHIN)  IV 2 g (06/17/18 0756)    Assessment: 85 yo female presented to ER after fall with fever and tachycardia. Pt takes warfarin PTA for atrial fibrillation and history of  aortic valve replacement. Pharmacy consulted to dose/monitor warfarin during admission. INR 1.9 therapeutic on admission. LD PTA 3/4  Home dose/INR goal (confirmed with outpatient clinic note from 06/08/18): Dose: Warfarin 2.5 mg on Tuesday & Sat; 5 mg on all other days of the week    Today, 06/17/18  INR 1.8 is sub-therapeutic  Hgb 9.2 decreased from admission.   Plt - down  No significant drug interactions noted. Pt is on antibiotics.  Warfarin dose 3/5 not charted as being given.  Goal of Therapy:  INR 2-3   Plan:   Warfarin 6 mg PO once this evening  continue enoxaparin 40mg  SQ once daily til INR therapeutic   INR daily  Recheck CBC with AM labs tomorrow  Closely monitor for signs/symptoms of bleeding or thrombosis  Dolly Rias RPh 06/17/2018, 10:11 AM Pager [phone]

## 2018-06-17 NOTE — Significant Event (Addendum)
Rapid Response Event Note  Overview: Time Called: 0724 Arrival Time: 0727 Event Type: Cardiac  Initial Focused Assessment: Rapid Response called for Chest Pain.  Upon arrival patient resting in bed. Rates pain as 6/10 (post one dose of SL nitroglycerin). Describes pain as pressure and burning over left chest. Pain does not radiate. Some nausea associated with pain. Pt alert and oriented x4. States that they were trying to get her purewick situated when chest pain started. HR 77 Afib, O2 91% on 2LNC, BP 108/64 (77)   Interventions: EKG obtained. O2 increased to 3L [facility].Troponin drawn. SL Nitroglycerin x3 total. 2mg  IV morphine once.  Pain down to 2/10. Pt transferred to 1422 for telemetry monitoring.          Wray Kearns

## 2018-06-17 NOTE — Progress Notes (Signed)
PROGRESS NOTE  Brittany [name]  WUJ:[id number] DOB: December 15, 1927 DOA: 06/14/2018 PCP: Mayra Neer, MD   Brief Narrative: Brittany [name] is an 85 y.o. female with a history of cirrhosis, chronic AFib, chronic back pain, HTN, gastritis/GERD, pulmonary HTN, AVR 2005 on coumadin, and bacterial endocarditis who presented from ALF with fever, weakness, and an unwitnessed fall. She was found to be febrile to 102.8F, tachypneic but not hypoxic. WBC 19k, INR 1.9. CXR demonstrated stable cardiomegaly and pleural effusions. Flu negative. She's had a chronic left foot ulcer under 3x weekly treatment by wound care RN and recently was noted to have malodorous discharge, now with increasing erythema. XR of the foot did not reveal osteomyelitis. Urinalysis was pending at admission, though urinary source was suspected based on history of recurrent UTIs. She denies diarrhea, though CDiff was checked and found to be negative. Vancomycin and cefepime were started after blood cultures were drawn. Cultures have grown group G strep. Antibiotics narrowed to ceftriaxone. WBC rose from 19k to 33.6k though thus far the patient appears nontoxic and had defervesced.   Assessment & Plan: Active Problems:   Aortic stenosis   Hypertension   Chronic atrial fibrillation (HCC)   Esophageal reflux   Hyponatremia   Diarrhea   Dementia (HCC)   Chronic ulcer of left foot (HCC)   Sepsis secondary to UTI ([place])  Sepsis due to group G strep bacteremia, source suspected to be left foot ulcer with cellulitis: - Leukocytosis modestly improved, afebrile >24hrs, initial repeat. PICC inserted due to poor access and possible need for prolonged IV abx.  - Repeat blood cultures ordered.  - Appreciate ID consultation.  - TTE without vegetation, will need TEE.  - Continue ceftriaxone 2g q24h. Note allergies to amoxicillin, keflex, sulfa, and macrobid. May still be able to transition to amp/amox per ID. - WOC consulted for local wound care.  Does not appear to require surgical debridement at this time.  Chronic atrial fibrillation:  - Rate is controlled, continue diltiazem - Continue coumadin as below  Chest pain in patient with hx CAD s/p RCA DES 2015: - Continue oxygen, prn NTG, prn morphine - Pt has typical and atypical features. Discussed with Dr. Debara Pickett, appreciate cardiology consultation.  - Continue statin, coumadin. Has been a nonresponder to plavix and not taking aspirin due to hx hiatal hernia. No longer taking atenolol.  - Trial GI cocktail as well . On PPI  Chronic diastolic CHF: After IVF's given, having some edema and dilated IVC on echo, today developing mild LE edema. Echo April 2019 w/EF 95-62%, diastolic function not mentioned due to AFib. - Stopped IVF 3/7, will give dose of home prn demadex 3/8 and monitor I/O, weights, BMP  History of brioprosthetic AVR: Mild stenosis on most recent echo. - Continue coumadin with INR goal 2-3, dosing per pharmacy. Giving lovenox while INR subtherapeutic. Up to 1.8 today.  Pulmonary HTN, emphysema:  - Echo as above - Has follow up with pulmonology, Dr. Lamonte Sakai 4/14  Left foot drop: s/p orthopedic procedure 1987.  - Fall precautions  Hyponatremia:  - Monitor with diuretic  Fall at home:  - Fall precautions, PT, OT eval to start 3/9  Dyslipidemia:  - Continue statin  GERD:  - PPI  DVT prophylaxis: Coumadin Code Status: DNR Family Communication: Son at bedside Disposition Plan: Uncertain. Anticipate return to ALF once clinically stabilizing, may need higher level of care if grows more weak during hospitalization.  Consultants:   ID  Procedures:   Echocardiogram  06/16/2018: 1. The left ventricle has hyperdynamic systolic function, with an ejection fraction of >65%. The cavity size was normal. There is moderate concentric left ventricular hypertrophy. Left ventricular diastology could not be evaluated secondary to atrial  fibrillation.  2. The right ventricle  has normal systolic function. The cavity was normal. There is no increase in right ventricular wall thickness. Right ventricular systolic pressure is severely elevated with an estimated pressure of 64.4 mmHg.  3. Left atrial size was severely dilated.  4. Right atrial size was severely dilated.  5. The mitral valve is degenerative. Moderate thickening of the mitral valve leaflet. Moderate calcification of the mitral valve leaflet. There is severe mitral annular calcification present. Mitral valve regurgitation is mild to moderate by color flow  Doppler.  6. The tricuspid valve is normal in structure. Tricuspid valve regurgitation is severe.  7. The aortic valve is tricuspid Moderate thickening of the aortic valve Moderate calcification of the aortic valve. moderate-severe stenosis of the aortic valve. No vegetaion is seen.  8. The pulmonic valve was normal in structure.  9. The inferior vena cava was dilated in size with <50% respiratory variability.  Antimicrobials:  Vancomycin 3/5  Cefepime 3/5  Ceftriaxone 3/6 >>    Subjective: Developed acute left-central chest pain feeling like a pressure, nonradiating, 9/10 while laying in bed quietly. Improved to 3/10 with nitroglycerin, then further with trial of morphine. Oxygen applied, rapid response called. I ordered labs, transferred to telemetry which continues to show rate-controlled AFib. Troponin slightly elevated with unknown baseline. No dyspnea, no fevers. Cardiology called.   Objective: Vitals:   06/17/18 0815 06/17/18 0820 06/17/18 0825 06/17/18 0838  BP: (!) 116/57 115/68 (!) 117/56 134/62  Pulse: 78 78 79 77  Resp:    16  Temp:    98.8 F (37.1 C)  TempSrc:    Oral  SpO2: 97% 97% 95% 97%  Weight:      Height:        Intake/Output Summary (Last 24 hours) at 06/17/2018 0937 Last data filed at 06/17/2018 0600 Gross per 24 hour  Intake 1433.23 ml  Output 1800 ml  Net -366.77 ml   Filed Weights   06/14/18 0947 06/14/18 1609  06/15/18 1306  Weight: 47.6 kg 47.6 kg 62.2 kg   Gen: Elderly, tired-appearing female in no distress Pulm: Nonlabored breathing supplemental oxygen. Clear, decreased modestly at the bases. CV: Regular rate and rhythm. Soft, unchanged systolic murmur, no rub, or gallop. No JVD, trace LLE dependent edema. GI: Abdomen soft, non-tender, non-distended, with normoactive bowel sounds.  Ext: Warm, LLE with lateral foot wound dressing foam padding c/d/i no proximal erythema contiguous but is developing splotchy erythema on left lower leg anterolaterally that is NOT tender.  Skin: As above. PICC in place if RUE. No other rashes, lesions or ulcers on visualized skin. Neuro: Alert and oriented. No focal neurological deficits. Psych: Judgement and insight appear fair. Mood euthymic & affect congruent. Behavior is appropriate.    Data Reviewed: I have personally reviewed following labs and imaging studies  CBC: Recent Labs  Lab 06/14/18 0950 06/15/18 0614 06/16/18 0347  WBC 19.0* 33.6* 29.2*  NEUTROABS 17.6*  --   --   HGB 13.3 10.2* 10.4*  HCT 40.4 31.8* 33.2*  MCV 91.4 93.3 94.9  PLT 256 166 696   Basic Metabolic Panel: Recent Labs  Lab 06/14/18 0950 06/15/18 0453 06/16/18 0347 06/17/18 0752  NA 130* 128* 129* 128*  K 4.1 4.4 4.3 4.3  CL  95* 100 99 96*  CO2 25 20* 21* 27  GLUCOSE 91 94 101* 98  BUN 18 18 16 16   CREATININE 0.78 0.89 0.67 0.56  CALCIUM 8.9 7.3* 7.9* 7.6*   GFR: Estimated Creatinine Clearance: 40.3 mL/min (by C-G formula based on SCr of 0.56 mg/dL). Liver Function Tests: Recent Labs  Lab 06/14/18 0950 06/17/18 0752  AST 31 28  ALT 26 22  ALKPHOS 90 96  BILITOT 1.2 0.3  PROT 8.2* 5.6*  ALBUMIN 3.8 2.2*   No results for input(s): LIPASE, AMYLASE in the last 168 hours. No results for input(s): AMMONIA in the last 168 hours. Coagulation Profile: Recent Labs  Lab 06/14/18 0950 06/15/18 0453 06/16/18 0347 06/17/18 0403  INR 1.9* 2.1* 1.4* 1.8*   Cardiac  Enzymes: Recent Labs  Lab 06/17/18 0752  TROPONINI 0.10*   Urine analysis:    Component Value Date/Time   COLORURINE YELLOW 06/14/2018 1117   APPEARANCEUR CLEAR 06/14/2018 1117   LABSPEC 1.010 06/14/2018 1117   PHURINE 7.0 06/14/2018 1117   GLUCOSEU NEGATIVE 06/14/2018 1117   GLUCOSEU NEGATIVE 06/25/2014 1201   HGBUR MODERATE (A) 06/14/2018 1117   BILIRUBINUR NEGATIVE 06/14/2018 1117   KETONESUR NEGATIVE 06/14/2018 1117   PROTEINUR NEGATIVE 06/14/2018 1117   UROBILINOGEN 0.2 08/27/2014 1954   NITRITE NEGATIVE 06/14/2018 1117   LEUKOCYTESUR NEGATIVE 06/14/2018 1117   Recent Results (from the past 240 hour(s))  Culture, blood (Routine x 2)     Status: Abnormal   Collection Time: 06/14/18  9:50 AM  Result Value Ref Range Status   Specimen Description   Final    BLOOD RIGHT ANTECUBITAL Performed at [facility], [place] [address], [place], [place] [address]    Special Requests   Final    BOTTLES DRAWN AEROBIC AND ANAEROBIC Blood Culture results may not be optimal due to an excessive volume of blood received in culture bottles Performed at [place] [address], [place], [place] [address]    Culture  Setup Time   Final    GRAM POSITIVE COCCI IN BOTH AEROBIC AND ANAEROBIC BOTTLES CRITICAL RESULT CALLED TO, READ BACK BY AND VERIFIED WITH: B GREEN PHARMD 06/14/18 2321 JDW Performed at [facility], [place] [address] [place], [place] [address]    Culture STREPTOCOCCUS GROUP G (A)  Final   Report Status 06/17/2018 FINAL  Final   Organism ID, Bacteria STREPTOCOCCUS GROUP G  Final      Susceptibility   Streptococcus group g - MIC*    CLINDAMYCIN <=0.25 SENSITIVE Sensitive     AMPICILLIN <=0.25 SENSITIVE Sensitive     ERYTHROMYCIN <=0.12 SENSITIVE Sensitive     VANCOMYCIN <=0.12 SENSITIVE Sensitive     CEFTRIAXONE <=0.12 SENSITIVE Sensitive     LEVOFLOXACIN 0.5 SENSITIVE Sensitive     * STREPTOCOCCUS GROUP G  Blood Culture ID Panel  (Reflexed)     Status: Abnormal   Collection Time: 06/14/18  9:50 AM  Result Value Ref Range Status   Enterococcus species NOT DETECTED NOT DETECTED Final   Listeria monocytogenes NOT DETECTED NOT DETECTED Final   Staphylococcus species NOT DETECTED NOT DETECTED Final   Staphylococcus aureus (BCID) NOT DETECTED NOT DETECTED Final   Streptococcus species DETECTED (A) NOT DETECTED Final    Comment: Not Enterococcus species, Streptococcus agalactiae, Streptococcus pyogenes, or Streptococcus pneumoniae. CRITICAL RESULT CALLED TO, READ BACK BY AND VERIFIED WITH: B GREEN PHARMD 06/14/18 2321 JDW    Streptococcus agalactiae NOT DETECTED NOT DETECTED Final  Streptococcus pneumoniae NOT DETECTED NOT DETECTED Final   Streptococcus pyogenes NOT DETECTED NOT DETECTED Final   Acinetobacter baumannii NOT DETECTED NOT DETECTED Final   Enterobacteriaceae species NOT DETECTED NOT DETECTED Final   Enterobacter cloacae complex NOT DETECTED NOT DETECTED Final   Escherichia coli NOT DETECTED NOT DETECTED Final   Klebsiella oxytoca NOT DETECTED NOT DETECTED Final   Klebsiella pneumoniae NOT DETECTED NOT DETECTED Final   Proteus species NOT DETECTED NOT DETECTED Final   Serratia marcescens NOT DETECTED NOT DETECTED Final   Haemophilus influenzae NOT DETECTED NOT DETECTED Final   Neisseria meningitidis NOT DETECTED NOT DETECTED Final   Pseudomonas aeruginosa NOT DETECTED NOT DETECTED Final   Candida albicans NOT DETECTED NOT DETECTED Final   Candida glabrata NOT DETECTED NOT DETECTED Final   Candida krusei NOT DETECTED NOT DETECTED Final   Candida parapsilosis NOT DETECTED NOT DETECTED Final   Candida tropicalis NOT DETECTED NOT DETECTED Final    Comment: Performed at [facility], [place] [address], [place], [place] [address]  Culture, Urine     Status: Abnormal   Collection Time: 06/14/18 11:17 AM  Result Value Ref Range Status   Specimen Description   Final    URINE, RANDOM Performed at [place] [address] [place], [place] [address]    Special Requests   Final    NONE Performed at [facility], [place] [address] [place], [place] [address]    Culture (A)  Final    <10,000 COLONIES/mL INSIGNIFICANT GROWTH Performed at [place] [address] [place], [place] [address]    Report Status 06/16/2018 FINAL  Final  Culture, blood (Routine x 2)     Status: None (Preliminary result)   Collection Time: 06/14/18 11:18 AM  Result Value Ref Range Status   Specimen Description   Final    BLOOD RIGHT HAND Performed at [place] [address], [place], [place] [address]    Special Requests   Final    BOTTLES DRAWN AEROBIC ONLY Blood Culture results may not be optimal due to an inadequate volume of blood received in culture bottles Performed at [place] [address], [place], [place] [address]    Culture   Final    NO GROWTH 3 DAYS Performed at [facility], [place] [address], [place], [place] [address]    Report Status PENDING  Incomplete  MRSA PCR Screening     Status: None   Collection Time: 06/14/18  5:52 PM  Result Value Ref Range Status   MRSA by PCR NEGATIVE NEGATIVE Final    Comment:        The GeneXpert MRSA Assay (FDA approved for NASAL specimens only), is one component of a comprehensive MRSA colonization surveillance program. It is not intended to diagnose MRSA infection nor to guide or monitor treatment for MRSA infections. Performed at [facility], [place] [address] [place], [place] [address]   C difficile quick scan w PCR reflex     Status: None   Collection Time: 06/15/18  5:35 AM  Result Value Ref Range Status   C Diff antigen NEGATIVE NEGATIVE Final   C Diff toxin NEGATIVE NEGATIVE Final   C Diff interpretation No C. difficile detected.  Final    Comment: Performed at [facility], [place] [address] [place],  [place] [address]      Radiology Studies: Dg Chest [facility]  Result Date: 06/16/2018 CLINICAL DATA:  Evaluate PICC line placement. EXAM: PORTABLE CHEST 1 VIEW COMPARISON:  June 14, 2018 FINDINGS: A right PICC line terminates in the central SVC. Bilateral pleural effusions, right greater than left, are more pronounced on this frontal view compared to the previous frontal view, likely due to difference in positioning and layering of the effusions. Cardiomegaly. The hila and mediastinum are normal. No pneumothorax. No nodules or masses. IMPRESSION: 1. The right PICC line is in good position. 2. Bilateral pleural effusions, right greater than left, with under lying atelectasis. Electronically Signed   By: Dorise Bullion III M.D   On: 06/16/2018 18:28   [place] Ekg Site Rite  Result Date: 06/16/2018 If Site Rite image not attached, placement could not be confirmed due to current cardiac rhythm.   Scheduled Meds: . atorvastatin  20 mg Oral Daily  . busPIRone  15 mg Oral BID  . cycloSPORINE  1 drop Both Eyes BID  . diltiazem  120 mg Oral BID  . enoxaparin (LOVENOX) injection  40 mg Subcutaneous Q24H  . feeding supplement (ENSURE ENLIVE)  237 mL Oral BID BM  . feeding supplement (PRO-STAT SUGAR FREE 64)  30 mL Oral BID  . multivitamin  1 tablet Oral BID  . oxybutynin  5 mg Oral Daily  . pantoprazole  20 mg Oral Daily  . polyvinyl alcohol  1 drop Both Eyes BID  . sodium chloride flush  10-40 mL Intracatheter Q12H  . sodium chloride flush  3 mL Intravenous Once  . Warfarin - Pharmacist Dosing Inpatient   Does not apply q1800   Continuous Infusions: . cefTRIAXone (ROCEPHIN)  IV 2 g (06/17/18 0756)     LOS: 2 days   Time spent: 35 minutes.  Patrecia Pour, MD [facility] [email or web address] Password [email or web address] 06/17/2018, 9:37 AM

## 2018-06-17 NOTE — Progress Notes (Signed)
[facility] for Infectious Disease   Reason for visit: Follow up on bacteremia  Interval History: new significant redness of left leg; WBC down to 18.6, remains afebrile since 3/6. Family at bedside.  Repeat blood cultures sent today.     Physical Exam: Constitutional:  Vitals:   06/17/18 1158 06/17/18 1303  BP: 127/77 (!) 141/73  Pulse: 79 84  Resp: 16 16  Temp:  99 F (37.2 C)  SpO2: 100% 99%   patient appears in NAD Eyes: anicteric Respiratory: Normal respiratory effort; CTA B Cardiovascular: RRR GI: soft, nt, nd MS: left let with non-blanching vascular appearing redness that is new; not contiguous with wound on foot; tenderness of leg  Review of Systems: Constitutional: negative for fevers and chills   Lab Results  Component Value Date   WBC 18.6 (H) 06/17/2018   HGB 9.2 (L) 06/17/2018   HCT 28.9 (L) 06/17/2018   MCV 91.7 06/17/2018   PLT 147 (L) 06/17/2018    Lab Results  Component Value Date   CREATININE 0.56 06/17/2018   BUN 16 06/17/2018   NA 128 (L) 06/17/2018   K 4.3 06/17/2018   CL 96 (L) 06/17/2018   CO2 27 06/17/2018    Lab Results  Component Value Date   ALT 22 06/17/2018   AST 28 06/17/2018   ALKPHOS 96 06/17/2018     Microbiology: Recent Results (from the past 240 hour(s))  Culture, blood (Routine x 2)     Status: Abnormal   Collection Time: 06/14/18  9:50 AM  Result Value Ref Range Status   Specimen Description   Final    BLOOD RIGHT ANTECUBITAL Performed at [facility], [place] [address] [place], [place] [address]    Special Requests   Final    BOTTLES DRAWN AEROBIC AND ANAEROBIC Blood Culture results may not be optimal due to an excessive volume of blood received in culture bottles Performed at [place] [address] [place], [place] [address]    Culture  Setup Time   Final    GRAM POSITIVE COCCI IN BOTH AEROBIC AND ANAEROBIC BOTTLES CRITICAL RESULT CALLED TO, READ BACK BY AND  VERIFIED WITH: B GREEN PHARMD 06/14/18 2321 JDW Performed at [facility], [place] [address], [place],  [address]    Culture STREPTOCOCCUS GROUP G (A)  Final   Report Status 06/17/2018 FINAL  Final   Organism ID, Bacteria STREPTOCOCCUS GROUP G  Final      Susceptibility   Streptococcus group g - MIC*    CLINDAMYCIN <=0.25 SENSITIVE Sensitive     AMPICILLIN <=0.25 SENSITIVE Sensitive     ERYTHROMYCIN <=0.12 SENSITIVE Sensitive     VANCOMYCIN <=0.12 SENSITIVE Sensitive     CEFTRIAXONE <=0.12 SENSITIVE Sensitive     LEVOFLOXACIN 0.5 SENSITIVE Sensitive     * STREPTOCOCCUS GROUP G  Blood Culture ID Panel (Reflexed)     Status: Abnormal   Collection Time: 06/14/18  9:50 AM  Result Value Ref Range Status   Enterococcus species NOT DETECTED NOT DETECTED Final   Listeria monocytogenes NOT DETECTED NOT DETECTED Final   Staphylococcus species NOT DETECTED NOT DETECTED Final   Staphylococcus aureus (BCID) NOT DETECTED NOT DETECTED Final   Streptococcus species DETECTED (A) NOT DETECTED Final    Comment: Not Enterococcus species, Streptococcus agalactiae, Streptococcus pyogenes, or Streptococcus pneumoniae. CRITICAL RESULT CALLED TO, READ BACK BY AND VERIFIED WITH: B GREEN PHARMD 06/14/18 2321 JDW    Streptococcus agalactiae NOT  DETECTED NOT DETECTED Final   Streptococcus pneumoniae NOT DETECTED NOT DETECTED Final   Streptococcus pyogenes NOT DETECTED NOT DETECTED Final   Acinetobacter baumannii NOT DETECTED NOT DETECTED Final   Enterobacteriaceae species NOT DETECTED NOT DETECTED Final   Enterobacter cloacae complex NOT DETECTED NOT DETECTED Final   Escherichia coli NOT DETECTED NOT DETECTED Final   Klebsiella oxytoca NOT DETECTED NOT DETECTED Final   Klebsiella pneumoniae NOT DETECTED NOT DETECTED Final   Proteus species NOT DETECTED NOT DETECTED Final   Serratia marcescens NOT DETECTED NOT DETECTED Final   Haemophilus influenzae NOT DETECTED NOT DETECTED Final   Neisseria  meningitidis NOT DETECTED NOT DETECTED Final   Pseudomonas aeruginosa NOT DETECTED NOT DETECTED Final   Candida albicans NOT DETECTED NOT DETECTED Final   Candida glabrata NOT DETECTED NOT DETECTED Final   Candida krusei NOT DETECTED NOT DETECTED Final   Candida parapsilosis NOT DETECTED NOT DETECTED Final   Candida tropicalis NOT DETECTED NOT DETECTED Final    Comment: Performed at [facility], [place] [address], [place], [place] [address]  Culture, Urine     Status: Abnormal   Collection Time: 06/14/18 11:17 AM  Result Value Ref Range Status   Specimen Description   Final    URINE, RANDOM Performed at [place] [address], [place], [place] [address]    Special Requests   Final    NONE Performed at [facility], [place] [address] [place], [place] [address]    Culture (A)  Final    <10,000 COLONIES/mL INSIGNIFICANT GROWTH Performed at [place] [address] [place], [place] [address]    Report Status 06/16/2018 FINAL  Final  Culture, blood (Routine x 2)     Status: None (Preliminary result)   Collection Time: 06/14/18 11:18 AM  Result Value Ref Range Status   Specimen Description   Final    BLOOD RIGHT HAND Performed at [place] [address], [place], [place] [address]    Special Requests   Final    BOTTLES DRAWN AEROBIC ONLY Blood Culture results may not be optimal due to an inadequate volume of blood received in culture bottles Performed at [place] [address], [place], [place] [address]    Culture   Final    NO GROWTH 3 DAYS Performed at [facility], [place] [address], [place], [place] [address]    Report Status PENDING  Incomplete  MRSA PCR Screening     Status: None   Collection Time: 06/14/18  5:52 PM  Result Value Ref Range Status   MRSA by PCR NEGATIVE NEGATIVE Final    Comment:        The GeneXpert MRSA Assay (FDA approved for NASAL  specimens only), is one component of a comprehensive MRSA colonization surveillance program. It is not intended to diagnose MRSA infection nor to guide or monitor treatment for MRSA infections. Performed at [facility], [place] [address] [place],  [address]   C difficile quick scan w PCR reflex     Status: None   Collection Time: 06/15/18  5:35 AM  Result Value Ref Range Status   C Diff antigen NEGATIVE NEGATIVE Final   C Diff toxin NEGATIVE NEGATIVE Final   C Diff interpretation No C. difficile detected.  Final    Comment: Performed at [facility], [place] [address] [place],  [address]    Impression/Plan:  1.  Left leg erythema - not cw cellulitis.  Is warm but not contiguous with wound.  Non-infectious appearing.  2.  Bacteremia - repeat cultures sent  3.  AVR - I would favor TEE if possible which will determine if she needs just two weeks of IV therapy vs PVE with prolonged IV therapy and followed potentially with suppressive antibiotics if a vegetation is present and gentamicin would be indicated.  Very different scenarios with or without vegetation.

## 2018-06-17 NOTE — Progress Notes (Addendum)
CRITICAL VALUE ALERT  Critical Value:  TROP 0.10  Date & Time Notied:  06/17/18 0900  Provider Notified: AJLUN  Orders Received/Actions taken: repeat ekg

## 2018-06-17 NOTE — Progress Notes (Signed)
Rn called to room with pt c/o 9/10 substernal chest pain with nausea. No sweating or clamminess with episode. Zofran given and pt assessed overall. Pt given first dose of sl nitro at 0722 with a bp of 129/70, hr 74. Second nitro given at 0729 with chest pain 5/10, bp 113/58, hr 79. Rapid response was called and md paged about situation. ekg obtained and labs ordered by md. 3rd nitro given at 0735 with cp 4/10, bp 1110/60, hr 75. md placed orders to transfer pt to tele for closer monitoring. rn repaged md about unresolved chest pain, still 4/10 after 3 sl nitro. Iv morphine ordered and given. Pt family called about transfer and reason for transfer. rn called report to amber rn via telephone at [phone]. Pt transferred to tele bed 1422 in stable condition with rapid response and floor rn.

## 2018-06-17 NOTE — Consult Note (Signed)
CONSULTATION NOTE   Patient Name: Brittany [name] Date of Encounter: 06/17/2018 Cardiologist: Mertie Moores, MD  Chief Complaint   Chest pain   Patient Profile   85 year old female with history of aortic valve replacement, hypertension, dyslipidemia, prior stroke with right visual field defect, and atrial fibrillation.  Admitted for fever, weakness and fall and found to be bacteremic.  Cardiology was consulted for chest pain which was nitrate responsive and elevated troponin.  HPI   Brittany [name] is a 85 y.o. female who is being seen today for the evaluation of chest pain at the request of Dr. Bonner Puna.  This is an 85 year old female followed by Dr. Acie Fredrickson for many years with a history of aortic valve replacement (Edwards tissue valve-2005), hypertension, dyslipidemia, prior stroke with right visual field defect and atrial fibrillation.  She was admitted for fever, weakness and fall and found to be bacteremic with a group G strep infection.  She was seen by infectious diseases who recommended an echo and transesophageal echo.  Apparently she complained of chest pain today.  This was in the left upper chest.  It was described as somewhat sharp. She was given nitroglycerin with a positive response.  Lab work indicated mild troponin elevation 0.1, however again this is in the setting of bacteremia with leukocytosis.  EKG personally reviewed shows A. fib at 85 with poor R wave progression.  She does have a history of coronary disease with cath in 2015 that showed moderate disease in the mid LAD, mild to moderate disease of left circumflex and severe fibrotic disease of the ostial right coronary.  This was successfully stented with a 2.5 x 14 mm resolute drug-eluting stent postdilated to 3.3 mm.  She has indicated she wants to be DNR and her daughter says that she is not particularly interested in procedures.  PMHx   Past Medical History:  Diagnosis Date  . Abnormal liver diagnostic imaging    suspected cirrhosis  . Arthritis   . Atrial fibrillation ([place])   . Chronic back pain   . Diverticulosis   . Dyslipidemia    takes Lipitor daily  . Dysrhythmia    Atrial Fibrillation  . Foot drop    SINCE 1987  . GERD (gastroesophageal reflux disease)   . H/O hiatal hernia   . Hearing impaired    Bilateral hearing aids  . History of bacterial endocarditis   . History of gastritis   . History of stomach ulcers    many yrs ago  . History of TIA (transient ischemic attack)    2013-- RESIDUAL PERIPHERAL VISION RIGHT EYE--  RESOLVED  . Hypertension   . IC (interstitial cystitis)   . Macular degeneration   . Pulmonary hypertension ([place]) 10/2013   Based on TTE  . S/P aortic valve replacement    2005  . Urine incontinence     Past Surgical History:  Procedure Laterality Date  . ABDOMINAL HYSTERECTOMY  1967  . AORTIC VALVE REPLACEMENT  09-29-2003    [facility] pericardial tissue valve  . APPENDECTOMY  1933  . BIOPSY  02/01/2018   Procedure: BIOPSY;  Surgeon: Rush Landmark Telford Nab., MD;  Location: [place];  Service: Gastroenterology;;  . CARDIAC CATHETERIZATION  07-15-2003 DR HELEN PRESTON   MODERATE TO MODERATELY SEVERE CALCIFIC AORTIC STENOSIS/  NORMAL CORONARY ARTERIES  . CARDIOVASCULAR STRESS TEST  01-30-2012  DR NASHER   NORMAL NUCLEAR STUDY/  EF 73%/  NORMAL LVF  . CARPAL TUNNEL RELEASE Bilateral   .  CATARACT EXTRACTION W/ INTRAOCULAR LENS  IMPLANT, BILATERAL    . CHOLECYSTECTOMY  1993  . CYSTO WITH HYDRODISTENSION N/A 02/05/2013   Procedure: CYSTOSCOPY/HYDRODISTENSION;  Surgeon: Irine Seal, MD;  Location: [facility];  Service: Urology;  Laterality: N/A;  . CYSTO/ HYDRODISTENTION/ INSTILLATION CLORPACTIN  MULTIPLE  last one 2009  . DILATION AND CURETTAGE OF UTERUS    . ERCP N/A 08/10/2012   Procedure: ENDOSCOPIC RETROGRADE CHOLANGIOPANCREATOGRAPHY (ERCP);  Surgeon: Inda Castle, MD;  Location: [place];  Service: Gastroenterology;   Laterality: N/A;  . ESOPHAGOGASTRODUODENOSCOPY (EGD) WITH PROPOFOL N/A 08/18/2014   Procedure: ESOPHAGOGASTRODUODENOSCOPY (EGD) WITH PROPOFOL;  Surgeon: Inda Castle, MD;  Location: [facility];  Service: Endoscopy;  Laterality: N/A;  . ESOPHAGOGASTRODUODENOSCOPY (EGD) WITH PROPOFOL N/A 02/01/2018   Procedure: ESOPHAGOGASTRODUODENOSCOPY (EGD) WITH PROPOFOL;  Surgeon: Rush Landmark Telford Nab., MD;  Location: [place];  Service: Gastroenterology;  Laterality: N/A;  . FOREIGN BODY REMOVAL  02/01/2018   Procedure: FOREIGN BODY REMOVAL;  Surgeon: Rush Landmark Telford Nab., MD;  Location: [place];  Service: Gastroenterology;;  . LEFT HEART CATHETERIZATION WITH CORONARY ANGIOGRAM N/A 10/18/2013   Procedure: LEFT HEART CATHETERIZATION WITH CORONARY ANGIOGRAM;  Surgeon: Jettie Booze, MD;  Location: [facility];  Service: Cardiovascular;  Laterality: N/A;  . LUMBAR [place] &  2007  . MACROPLASTIQUE URETHRAL IMPLANTATION  08-27-2009  . MINI-OPEN RIGHT ROTATOR CUFF REPAIR  07-12-2001  . RIGHT SHOULDER ARTHROSCOPY W/ DEBRIDEMENT ROTATOR CUFF AND LABRAL TEAR/ ACROMINOPLASTY/ DISTAL CLAVICLE EXCISION/ CA LIGAMENT RELEASE  10-08-1999  . TONSILLECTOMY AND ADENOIDECTOMY    . TRANSTHORACIC ECHOCARDIOGRAM  08-10-2011   MILD LVH/  EF 55-60%/  NORMAL AVR TISSUE/ MILD MR/  MODERATE DILATED LA/  MILD DILATED RA  . TRANSVAGINAL TAPE PROCEDURE  07-30-2002  . VEIN LIGATION  1969   RIGHT LOWER LEG    FAMHx   Family History  Problem Relation Age of Onset  . Heart disease Father   . Stomach cancer Maternal Grandmother   . Esophageal cancer Maternal Grandfather   . Bipolar disorder Son        Committed Suicide  . Coronary artery disease Brother   . Bipolar disorder Brother        commited suiside at 82yo  . Alcohol abuse Sister        sister #1  . Alcohol abuse Sister        sister #2  . Colon cancer Maternal Aunt   . Lung disease Neg Hx     SOCHx    reports that she quit smoking  about 28 years ago. Her smoking use included cigarettes. She started smoking about 69 years ago. She has a 20.00 pack-year smoking history. She has never used smokeless tobacco. She reports that she does not drink alcohol or use drugs.  Outpatient Medications   No current facility-administered medications on file prior to encounter.    Current Outpatient Medications on File Prior to Encounter  Medication Sig Dispense Refill  . acetaminophen (TYLENOL) 325 MG tablet Take 650 mg by mouth daily as needed for mild pain.    Marland Kitchen atorvastatin (LIPITOR) 20 MG tablet Take 20 mg by mouth daily.    . busPIRone (BUSPAR) 15 MG tablet Take 1 tablet (15 mg total) by mouth 2 (two) times daily. 180 tablet 2  . cycloSPORINE (RESTASIS) 0.05 % ophthalmic emulsion Place 1 drop into both eyes 2 (two) times daily.    Marland Kitchen diltiazem (CARDIZEM) 120 MG tablet Take 120 mg by  mouth 2 (two) times daily.    Marland Kitchen HYDROcodone-acetaminophen (NORCO/VICODIN) 5-325 MG tablet Take 1 tablet by mouth every 6 (six) hours as needed for severe pain. 14 tablet 0  . lisinopril (PRINIVIL,ZESTRIL) 20 MG tablet Take 20 mg by mouth 2 (two) times daily.    . Multiple Vitamins-Minerals (ICAPS AREDS 2) CAPS Take 1 capsule by mouth 2 (two) times daily.    . ondansetron (ZOFRAN) 8 MG tablet Take 1 tablet (8 mg total) by mouth 2 (two) times daily. (Patient taking differently: Take 8 mg by mouth every 8 (eight) hours as needed for nausea or vomiting. ) 90 tablet 1  . oxybutynin (DITROPAN) 5 MG tablet Take 5 mg by mouth daily.    . pantoprazole (PROTONIX) 20 MG tablet Take 1 tablet (20 mg total) by mouth daily. 30 tablet 0  . Polyvinyl Alcohol-Povidone (REFRESH OP) Place 1 drop into both eyes 2 (two) times daily.    Marland Kitchen torsemide (DEMADEX) 20 MG tablet Take 20 mg by mouth daily as needed (retaining fluid).     . warfarin (COUMADIN) 5 MG tablet Take as directed by [facility] (Patient taking differently: Take 2.5-5 mg by mouth. Take 2.5 mg every Tues and Sat  & Take 5 mg all other days ake as directed by [facility] Clinic) 30 tablet 2  . nitroGLYCERIN (NITROSTAT) 0.4 MG SL tablet Place 1 tablet (0.4 mg total) under the tongue every 5 (five) minutes as needed for chest pain. 30 tablet 0    Inpatient Medications    Scheduled Meds: . atorvastatin  20 mg Oral Daily  . busPIRone  15 mg Oral BID  . cycloSPORINE  1 drop Both Eyes BID  . diltiazem  120 mg Oral BID  . enoxaparin (LOVENOX) injection  40 mg Subcutaneous Q24H  . feeding supplement (ENSURE ENLIVE)  237 mL Oral BID BM  . feeding supplement (PRO-STAT SUGAR FREE 64)  30 mL Oral BID  . multivitamin  1 tablet Oral BID  . oxybutynin  5 mg Oral Daily  . pantoprazole  20 mg Oral Daily  . polyvinyl alcohol  1 drop Both Eyes BID  . sodium chloride flush  10-40 mL Intracatheter Q12H  . sodium chloride flush  3 mL Intravenous Once  . torsemide  20 mg Oral Once  . warfarin  6 mg Oral ONCE-1800  . Warfarin - Pharmacist Dosing Inpatient   Does not apply q1800    Continuous Infusions: . cefTRIAXone (ROCEPHIN)  IV 2 g (06/17/18 0756)    PRN Meds: acetaminophen **OR** acetaminophen, alum & mag hydroxide-simeth, HYDROcodone-acetaminophen, morphine injection, nitroGLYCERIN, ondansetron **OR** ondansetron (ZOFRAN) IV, promethazine, senna-docusate, sodium chloride flush, sorbitol   ALLERGIES   Allergies  Allergen Reactions  . Amlodipine Swelling and Other (See Comments)     Unspecified swelling reaction  . Amoxicillin Diarrhea, Nausea And Vomiting and Other (See Comments)    Has patient had a PCN reaction causing immediate rash, facial/tongue/throat swelling, SOB or lightheadedness with hypotension: No Has patient had a PCN reaction causing severe rash involving mucus membranes or skin necrosis: No Has patient had a PCN reaction that required hospitalization: No Has patient had a PCN reaction occurring within the last 10 years: No If all of the above answers are "NO", then may proceed with  Cephalosporin use.  . Carafate [Sucralfate] Swelling and Other (See Comments)    Reaction:  Knee swelling/redness   . Cephalexin Diarrhea  . Codeine Nausea And Vomiting  . Irbesartan Swelling and Other (See Comments)  Reaction:  Facial/hand swelling and numbness   . Morphine And Related Other (See Comments)    Pt states that she has a history of addiction with this medication.      . Neurontin [Gabapentin] Swelling and Other (See Comments)    Reaction:  Leg swelling   . Nitrofurantoin Monohyd Macro Diarrhea and Nausea And Vomiting  . Prednisone Other (See Comments)    Reaction:  Elevated BP  . Sulfa Antibiotics Rash    ROS   Pertinent items noted in HPI and remainder of comprehensive ROS otherwise negative.  Vitals   Vitals:   06/17/18 0820 06/17/18 0825 06/17/18 0838 06/17/18 1007  BP: 115/68 (!) 117/56 134/62 135/66  Pulse: 78 79 77 77  Resp:   16 16  Temp:   98.8 F (37.1 C)   TempSrc:   Oral   SpO2: 97% 95% 97% 99%  Weight:      Height:        Intake/Output Summary (Last 24 hours) at 06/17/2018 1143 Last data filed at 06/17/2018 0600 Gross per 24 hour  Intake 1433.23 ml  Output 1800 ml  Net -366.77 ml   Filed Weights   06/14/18 0947 06/14/18 1609 06/15/18 1306  Weight: 47.6 kg 47.6 kg 62.2 kg    Physical Exam   General appearance: Somnolent, no distress Neck: no carotid bruit, no JVD and thyroid not enlarged, symmetric, no tenderness/mass/nodules Lungs: diminished breath sounds bibasilar Heart: irregularly irregular rhythm Abdomen: soft, non-tender; bowel sounds normal; no masses,  no organomegaly Extremities: No edema, left lower extremity with significant rubor and warmth Pulses: 2+ and symmetric Skin: Left lower extremity rubor Neurologic: Mental status: Somnolent, but awakens to stimulation Psych: Pleasant  Labs   Results for orders placed or performed during the hospital encounter of 06/14/18 (from the past 48 hour(s))  Protime-INR     Status:  Abnormal   Collection Time: 06/16/18  3:47 AM  Result Value Ref Range   Prothrombin Time 17.2 (H) 11.4 - 15.2 seconds   INR 1.4 (H) 0.8 - 1.2    Comment: (NOTE) INR goal varies based on device and disease states. Performed at [facility], [place] [address], [place], [place] [address]   CBC     Status: Abnormal   Collection Time: 06/16/18  3:47 AM  Result Value Ref Range   WBC 29.2 (H) 4.0 - 10.5 K/uL   RBC 3.50 (L) 3.87 - 5.11 MIL/uL   Hemoglobin 10.4 (L) 12.0 - 15.0 g/dL   HCT 33.2 (L) 36.0 - 46.0 %   MCV 94.9 80.0 - 100.0 fL   MCH 29.7 26.0 - 34.0 pg   MCHC 31.3 30.0 - 36.0 g/dL   RDW 13.7 11.5 - 15.5 %   Platelets 154 150 - 400 K/uL   nRBC 0.0 0.0 - 0.2 %    Comment: Performed at [facility], [place] [address], [place], [place] [address]  Basic metabolic panel     Status: Abnormal   Collection Time: 06/16/18  3:47 AM  Result Value Ref Range   Sodium 129 (L) 135 - 145 mmol/L   Potassium 4.3 3.5 - 5.1 mmol/L   Chloride 99 98 - 111 mmol/L   CO2 21 (L) 22 - 32 mmol/L   Glucose, Bld 101 (H) 70 - 99 mg/dL   BUN 16 8 - 23 mg/dL   Creatinine, Ser 0.67 0.44 - 1.00 mg/dL   Calcium 7.9 (L) 8.9 - 10.3 mg/dL   GFR calc  non Af Amer >60 >60 mL/min   GFR calc Af Amer >60 >60 mL/min   Anion gap 9 5 - 15    Comment: Performed at [facility], [place] [address], [place], [place] [address]  Protime-INR     Status: Abnormal   Collection Time: 06/17/18  4:03 AM  Result Value Ref Range   Prothrombin Time 20.2 (H) 11.4 - 15.2 seconds   INR 1.8 (H) 0.8 - 1.2    Comment: (NOTE) INR goal varies based on device and disease states. Performed at [facility], [place] [address], [place], [place] [address]   Lactic acid, plasma     Status: None   Collection Time: 06/17/18  7:31 AM  Result Value Ref Range   Lactic Acid, Venous 1.0 0.5 - 1.9 mmol/L    Comment: Performed at [facility], [place] [address]  [place], [place] [address]  Comprehensive metabolic panel     Status: Abnormal   Collection Time: 06/17/18  7:52 AM  Result Value Ref Range   Sodium 128 (L) 135 - 145 mmol/L   Potassium 4.3 3.5 - 5.1 mmol/L   Chloride 96 (L) 98 - 111 mmol/L   CO2 27 22 - 32 mmol/L   Glucose, Bld 98 70 - 99 mg/dL   BUN 16 8 - 23 mg/dL   Creatinine, Ser 0.56 0.44 - 1.00 mg/dL   Calcium 7.6 (L) 8.9 - 10.3 mg/dL   Total Protein 5.6 (L) 6.5 - 8.1 g/dL   Albumin 2.2 (L) 3.5 - 5.0 g/dL   AST 28 15 - 41 U/L   ALT 22 0 - 44 U/L   Alkaline Phosphatase 96 38 - 126 U/L   Total Bilirubin 0.3 0.3 - 1.2 mg/dL   GFR calc non Af Amer >60 >60 mL/min   GFR calc Af Amer >60 >60 mL/min   Anion gap 5 5 - 15    Comment: Performed at [facility], [place] [address] [place], [place] [address]  Troponin I - Once     Status: Abnormal   Collection Time: 06/17/18  7:52 AM  Result Value Ref Range   Troponin I 0.10 (HH) <0.03 ng/mL    Comment: CRITICAL RESULT CALLED TO, READ BACK BY AND VERIFIED WITHMarrian Salvage RN AT [phone] 06/17/18 MULLINS,T Performed at [facility], [place] [address], [place], [place] [address]   CBC     Status: Abnormal   Collection Time: 06/17/18  8:23 AM  Result Value Ref Range   WBC 18.6 (H) 4.0 - 10.5 K/uL   RBC 3.15 (L) 3.87 - 5.11 MIL/uL   Hemoglobin 9.2 (L) 12.0 - 15.0 g/dL   HCT 28.9 (L) 36.0 - 46.0 %   MCV 91.7 80.0 - 100.0 fL   MCH 29.2 26.0 - 34.0 pg   MCHC 31.8 30.0 - 36.0 g/dL   RDW 13.6 11.5 - 15.5 %   Platelets 147 (L) 150 - 400 K/uL   nRBC 0.0 0.0 - 0.2 %    Comment: Performed at [facility], [place] [address], [place],  [address]    ECG   A. fib at 73- Personally Reviewed  Telemetry   N/A  Radiology   Dg Chest Port 1 View  Result Date: 06/16/2018 CLINICAL DATA:  Evaluate PICC line placement. EXAM: PORTABLE CHEST 1 VIEW COMPARISON:  June 14, 2018 FINDINGS: A right PICC line terminates in the central SVC. Bilateral pleural  effusions, right greater than  left, are more pronounced on this frontal view compared to the previous frontal view, likely due to difference in positioning and layering of the effusions. Cardiomegaly. The hila and mediastinum are normal. No pneumothorax. No nodules or masses. IMPRESSION: 1. The right PICC line is in good position. 2. Bilateral pleural effusions, right greater than left, with under lying atelectasis. Electronically Signed   By: Dorise Bullion III M.D   On: 06/16/2018 18:28   [place] Ekg Site Rite  Result Date: 06/16/2018 If Site Rite image not attached, placement could not be confirmed due to current cardiac rhythm.   Cardiac Studies   Procedure: 2D Echo, Cardiac Doppler and Color Doppler  Indications:    Aortic Valve Disorder. Bacteremia.   History:        Patient has prior history of Echocardiogram examinations, most                 recent 07/21/2017. TIA and Pulmonary HTN, Atrial Fibrillation;                 Risk Factors: Hypertension and Dyslipidemia. Aortic Valve                 Replacement. GERD.   Sonographer:    Jonelle Sidle Dance Referring Phys: [place]    1. The left ventricle has hyperdynamic systolic function, with an ejection fraction of >65%. The cavity size was normal. There is moderate concentric left ventricular hypertrophy. Left ventricular diastology could not be evaluated secondary to atrial  fibrillation.  2. The right ventricle has normal systolic function. The cavity was normal. There is no increase in right ventricular wall thickness. Right ventricular systolic pressure is severely elevated with an estimated pressure of 64.4 mmHg.  3. Left atrial size was severely dilated.  4. Right atrial size was severely dilated.  5. The mitral valve is degenerative. Moderate thickening of the mitral valve leaflet. Moderate calcification of the mitral valve leaflet. There is severe mitral annular calcification present. Mitral valve regurgitation  is mild to moderate by color flow  Doppler.  6. The tricuspid valve is normal in structure. Tricuspid valve regurgitation is severe.  7. The aortic valve is tricuspid Moderate thickening of the aortic valve Moderate calcification of the aortic valve. moderate-severe stenosis of the aortic valve. No vegetaion is seen.  8. The pulmonic valve was normal in structure.  9. The inferior vena cava was dilated in size with <50% respiratory variability.  Impression   Principal Problem:   Sepsis secondary to UTI [facility]) Active Problems:   Aortic stenosis   Hypertension   Chest pain   Chronic atrial fibrillation (HCC)   Esophageal reflux   Hyponatremia   Diarrhea   Dementia (HCC)   Chronic ulcer of left foot (HCC)   Recommendation   1. Mrs. Kasik is admitted with sepsis and has strep G bacteremia.  Her surface echo showed no significant vegetations however TEE was recommended.  I do not think this is playing a role in her chest pain.  She apparently had nitrate responsive chest pain although EKG shows chronic A. fib without ischemic changes.  Initial troponin was mildly elevated.  Would recommend trending troponins however at this point she is not a cath candidate given her bacteremia.  Ultimately after discussion with her daughter, it sounds like she would not want further invasive procedures.  As to the recommendation for TEE, I think we would have to consider that on a day-to-day basis.  She is  notably somnolent today.  If she continues to improve and is agreeable to TEE and may be helpful to understand duration of antibiotic therapy, ultimately, however if she does have vegetation, is not likely she would be a surgical candidate and antibiotic therapy is the treatment option.  Thanks for the kind referral.  Time Spent Directly with Patient:  I have spent a total of 45 minutes with the patient reviewing hospital notes, telemetry, EKGs, labs and examining the patient as well as establishing an  assessment and plan that was discussed personally with the patient.  > 50% of time was spent in direct patient care.  Length of Stay:  LOS: 2 days   Pixie Casino, MD, [facility], [place] Director of the Advanced Lipid Disorders &  Cardiovascular Risk Reduction Clinic Diplomate of the American Board of Clinical Lipidology Attending Cardiologist  Direct Dial: [phone]  Fax: [phone]  Website:  [email or web address] 06/17/2018, 11:43 AM

## 2018-06-17 NOTE — Progress Notes (Signed)
Patient c/o CP. VS assessed 70, 105/55   . Pain described as sharp & dull Left mid chest. Morphine administered. Pt now reports pain as better. Repeat BP 65, 98/55. Will continue to monitor.

## 2018-06-17 NOTE — Progress Notes (Signed)
PT Cancellation Note  Patient Details Name: Brittany [name] MRN: [id number] DOB: Aug 29, 1927   Cancelled Treatment:     PT deferred this am - pt in rapid response.  Will follow.   Manda Holstad 06/17/2018, 7:59 AM

## 2018-06-17 NOTE — Progress Notes (Signed)
OT Cancellation Note  Patient Details Name: Brittany [name] MRN: [id number] DOB: 1927-05-27   Cancelled Treatment:    Reason Eval/Treat Not Completed: Patient not medically ready OT deferred this AM, pt in rapid response. Will continue to follow to initiate OT POC.  Zenovia Jarred, MSOT, OTR/L Behavioral Health OT/ Acute Relief OT [facility] Office: [place] 06/17/2018, 8:59 AM

## 2018-06-18 DIAGNOSIS — Z87440 Personal history of urinary (tract) infections: Secondary | ICD-10-CM

## 2018-06-18 DIAGNOSIS — B955 Unspecified streptococcus as the cause of diseases classified elsewhere: Secondary | ICD-10-CM

## 2018-06-18 DIAGNOSIS — R7881 Bacteremia: Secondary | ICD-10-CM

## 2018-06-18 DIAGNOSIS — R011 Cardiac murmur, unspecified: Secondary | ICD-10-CM

## 2018-06-18 DIAGNOSIS — N390 Urinary tract infection, site not specified: Secondary | ICD-10-CM

## 2018-06-18 DIAGNOSIS — R079 Chest pain, unspecified: Secondary | ICD-10-CM

## 2018-06-18 DIAGNOSIS — L03116 Cellulitis of left lower limb: Secondary | ICD-10-CM

## 2018-06-18 DIAGNOSIS — R7989 Other specified abnormal findings of blood chemistry: Secondary | ICD-10-CM

## 2018-06-18 LAB — CBC WITH DIFFERENTIAL/PLATELET
ABS IMMATURE GRANULOCYTES: 0.08 10*3/uL — AB (ref 0.00–0.07)
BASOS PCT: 0 %
Basophils Absolute: 0 10*3/uL (ref 0.0–0.1)
Eosinophils Absolute: 0.5 10*3/uL (ref 0.0–0.5)
Eosinophils Relative: 4 %
HCT: 28.4 % — ABNORMAL LOW (ref 36.0–46.0)
Hemoglobin: 9 g/dL — ABNORMAL LOW (ref 12.0–15.0)
Immature Granulocytes: 1 %
Lymphocytes Relative: 11 %
Lymphs Abs: 1.5 10*3/uL (ref 0.7–4.0)
MCH: 29.2 pg (ref 26.0–34.0)
MCHC: 31.7 g/dL (ref 30.0–36.0)
MCV: 92.2 fL (ref 80.0–100.0)
MONOS PCT: 11 %
Monocytes Absolute: 1.5 10*3/uL — ABNORMAL HIGH (ref 0.1–1.0)
NEUTROS ABS: 10.2 10*3/uL — AB (ref 1.7–7.7)
Neutrophils Relative %: 73 %
Platelets: 162 10*3/uL (ref 150–400)
RBC: 3.08 MIL/uL — ABNORMAL LOW (ref 3.87–5.11)
RDW: 13.6 % (ref 11.5–15.5)
WBC: 13.8 10*3/uL — ABNORMAL HIGH (ref 4.0–10.5)
nRBC: 0 % (ref 0.0–0.2)

## 2018-06-18 LAB — PROTIME-INR
INR: 2.4 — ABNORMAL HIGH (ref 0.8–1.2)
Prothrombin Time: 25.7 seconds — ABNORMAL HIGH (ref 11.4–15.2)

## 2018-06-18 LAB — BASIC METABOLIC PANEL
Anion gap: 7 (ref 5–15)
BUN: 20 mg/dL (ref 8–23)
CO2: 30 mmol/L (ref 22–32)
Calcium: 7.8 mg/dL — ABNORMAL LOW (ref 8.9–10.3)
Chloride: 95 mmol/L — ABNORMAL LOW (ref 98–111)
Creatinine, Ser: 0.76 mg/dL (ref 0.44–1.00)
GFR calc Af Amer: >60 mL/min (ref >60)
GFR calc non Af Amer: >60 mL/min (ref >60)
Glucose, Bld: 122 mg/dL — ABNORMAL HIGH (ref 70–99)
Potassium: 4 mmol/L (ref 3.5–5.1)
Sodium: 132 mmol/L — ABNORMAL LOW (ref 135–145)

## 2018-06-18 MED ORDER — WARFARIN SODIUM 2.5 MG PO TABS
2.5000 mg | ORAL_TABLET | Freq: Once | ORAL | Status: AC
  Administered 2018-06-18: 2.5 mg via ORAL
  Filled 2018-06-18: qty 1

## 2018-06-18 MED ORDER — ISOSORBIDE MONONITRATE ER 30 MG PO TB24: 15.0000 mg | ORAL_TABLET | Freq: Every day | ORAL | Status: DC

## 2018-06-18 MED ORDER — ISOSORBIDE MONONITRATE ER 30 MG PO TB24
15.0000 mg | ORAL_TABLET | Freq: Every day | ORAL | Status: DC
  Administered 2018-06-18 – 2018-06-21 (×4): 15 mg via ORAL
  Filled 2018-06-18 (×4): qty 1

## 2018-06-18 MED ORDER — SALINE SPRAY 0.65 % NA SOLN
1.0000 | NASAL | Status: DC | PRN
  Administered 2018-06-18: 1 via NASAL
  Filled 2018-06-18: qty 44

## 2018-06-18 NOTE — Progress Notes (Signed)
PROGRESS NOTE  Brittany [name]  YYT:[id number] DOB: [birth year] DOA: 06/14/2018 PCP: Mayra Neer, MD   Brief Narrative: Brittany [name] is an 85 y.o. female with a history of cirrhosis, chronic AFib, chronic back pain, HTN, gastritis/GERD, pulmonary HTN, AVR 2005 on coumadin, and bacterial endocarditis who presented from ALF with fever, weakness, and an unwitnessed fall. She was found to be febrile to 102.49F, tachypneic but not hypoxic. WBC 19k, INR 1.9. CXR demonstrated stable cardiomegaly and pleural effusions. Flu negative. She's had a chronic left foot ulcer under 3x weekly treatment by wound care RN and recently was noted to have malodorous discharge, now with increasing erythema. XR of the foot did not reveal osteomyelitis. Urinalysis was pending at admission, though urinary source was suspected based on history of recurrent UTIs. She denies diarrhea, though CDiff was checked and found to be negative. Vancomycin and cefepime were started after blood cultures were drawn. Cultures have grown group G strep. Antibiotics narrowed to ceftriaxone. WBC rose from 19k to 33.6k though thus far the patient appears nontoxic and had defervesced.   Assessment & Plan: Principal Problem:   Sepsis secondary to UTI [facility]) Active Problems:   Aortic stenosis   Hypertension   Chest pain   Chronic atrial fibrillation (HCC)   Esophageal reflux   Hyponatremia   Diarrhea   Dementia (HCC)   Chronic ulcer of left foot (HCC)  Sepsis due to group G strep bacteremia, source suspected to be left foot ulcer with cellulitis: - Leukocytosis continues significant improvement. PICC inserted due to poor access and possible need for prolonged IV abx.  - Repeat blood cultures ordered.  - Appreciate ID consultation.  - TTE without vegetation, will need TEE to evaluate for prosthetic valve endocarditis which would necessitate lifelong abx. Discussed and confirmed with cardiology NP 3/9.  - Continue ceftriaxone 2g q24h. Note  allergies to amoxicillin, keflex, sulfa, and macrobid. May still be able to transition to amp/amox per ID. - WOC consulted for local wound care. Does not appear to require surgical debridement at this time.  Left leg erythema: Uncertain etiology, though not contiguous with wound. It is tender, not consistent with purely vascular etiology and platelets normal.  - Not expanding, on effective abx. Will continue to monitor.  Chronic atrial fibrillation:  - Rate is controlled, continue diltiazem - Continue coumadin as below  Chest pain in patient with hx CAD s/p RCA DES 2015: Troponin trend reassuring 0.10 > 0.10 > 0.09 with no ischemic ECG changes.  - [place] cardiology consultation. The patient is on anticoagulation and is not a good candidate for catheterization even if she were to develop NSTEMI.  - Continue oxygen, prn NTG, prn morphine - Continue statin, coumadin. Has been a nonresponder to plavix and not taking aspirin due to hx hiatal hernia. No longer taking atenolol.  - Trial GI cocktail as well. On PPI  Chronic diastolic CHF: After IVF's given, having some edema and dilated IVC on echo, today developing mild LE edema. Echo April 2019 w/EF 27-51%, diastolic function not mentioned due to AFib. - Stopped IVF 3/7, gave demadex 3/8. BUN:Cr >20:1 and edema improved. No dyspnea or crackles currently.  - Continue to monitor I/O, weights, BMP  History of brioprosthetic AVR: Mild stenosis on most recent echo. - Continue coumadin with INR goal 2-3, dosing per pharmacy.   Pulmonary HTN, emphysema:  - Echo as above - Has follow up with pulmonology, Dr. Lamonte Sakai 4/14  Left foot drop: s/p orthopedic procedure 1987.  -  Fall precautions  Hyponatremia:  - Monitor with diuretic  Fall at home:  - Fall precautions, PT, OT eval to start 3/9  Dyslipidemia:  - Continue statin  GERD:  - PPI  DVT prophylaxis: Coumadin Code Status: DNR Family Communication: None at bedside this AM. Discussed  with extended family and daughter after rounds yesterday as well. Disposition Plan: Uncertain. Anticipate return to ALF once clinically stabilizing, may need higher level of care if grows more weak during hospitalization.  Consultants:   ID  Procedures:   Echocardiogram 06/16/2018: 1. The left ventricle has hyperdynamic systolic function, with an ejection fraction of >65%. The cavity size was normal. There is moderate concentric left ventricular hypertrophy. Left ventricular diastology could not be evaluated secondary to atrial  fibrillation.  2. The right ventricle has normal systolic function. The cavity was normal. There is no increase in right ventricular wall thickness. Right ventricular systolic pressure is severely elevated with an estimated pressure of 64.4 mmHg.  3. Left atrial size was severely dilated.  4. Right atrial size was severely dilated.  5. The mitral valve is degenerative. Moderate thickening of the mitral valve leaflet. Moderate calcification of the mitral valve leaflet. There is severe mitral annular calcification present. Mitral valve regurgitation is mild to moderate by color flow  Doppler.  6. The tricuspid valve is normal in structure. Tricuspid valve regurgitation is severe.  7. The aortic valve is tricuspid Moderate thickening of the aortic valve Moderate calcification of the aortic valve. moderate-severe stenosis of the aortic valve. No vegetaion is seen.  8. The pulmonic valve was normal in structure.  9. The inferior vena cava was dilated in size with <50% respiratory variability.  Antimicrobials:  Vancomycin 3/5  Cefepime 3/5  Ceftriaxone 3/6 >>    Subjective: Some nausea but chest pain has resolved. Ate some breakfast without vomiting. No dyspnea. Feels worn out, no fevers.  Objective: Vitals:   06/17/18 1521 06/17/18 1705 06/17/18 2049 06/18/18 0324  BP: (!) 100/54 (!) 112/55 (!) 125/58 (!) 125/58  Pulse: 64 (!) 59 65 65  Resp:  16 15 16   Temp:   98.1 F (36.7 C) 98.7 F (37.1 C) 98.3 F (36.8 C)  TempSrc:  Oral Oral Oral  SpO2: 100% 100% 100% 98%  Weight:      Height:        Intake/Output Summary (Last 24 hours) at 06/18/2018 1209 Last data filed at 06/18/2018 0900 Gross per 24 hour  Intake 120 ml  Output 1000 ml  Net -880 ml   Filed Weights   06/14/18 0947 06/14/18 1609 06/15/18 1306  Weight: 47.6 kg 47.6 kg 62.2 kg   Gen: Pleasant elderly female in no distress Pulm: Nonlabored breathing supplemental oxygen. Clear bilaterally. CV: Irreg irreg. Soft systolic murmur without rub, or gallop. No JVD, trace LLE dependent edema. GI: Abdomen soft, no focal tenderness, non-distended, with normoactive bowel sounds.  Ext: Warm, no deformities Skin: Left leg with stable slightly blanching erythema which is warm but not warmer than contralateral area. Not tracking proximally. +Tender. Left lateral foot wound is stable, no purulence. Neuro: Alert and oriented. No focal neurological deficits. Psych: Judgement and insight appear fair. Mood euthymic & affect congruent. Behavior is appropriate.    Data Reviewed: I have personally reviewed following labs and imaging studies  CBC: Recent Labs  Lab 06/14/18 0950 06/15/18 0614 06/16/18 0347 06/17/18 0823 06/18/18 0316  WBC 19.0* 33.6* 29.2* 18.6* 13.8*  NEUTROABS 17.6*  --   --   --  10.2*  HGB 13.3 10.2* 10.4* 9.2* 9.0*  HCT 40.4 31.8* 33.2* 28.9* 28.4*  MCV 91.4 93.3 94.9 91.7 92.2  PLT 256 166 154 147* 956   Basic Metabolic Panel: Recent Labs  Lab 06/14/18 0950 06/15/18 0453 06/16/18 0347 06/17/18 0752 06/18/18 0316  NA 130* 128* 129* 128* 132*  K 4.1 4.4 4.3 4.3 4.0  CL 95* 100 99 96* 95*  CO2 25 20* 21* 27 30  GLUCOSE 91 94 101* 98 122*  BUN 18 18 16 16 20   CREATININE 0.78 0.89 0.67 0.56 0.76  CALCIUM 8.9 7.3* 7.9* 7.6* 7.8*   GFR: Estimated Creatinine Clearance: 40.3 mL/min (by C-G formula based on SCr of 0.76 mg/dL). Liver Function Tests: Recent Labs  Lab  06/14/18 0950 06/17/18 0752  AST 31 28  ALT 26 22  ALKPHOS 90 96  BILITOT 1.2 0.3  PROT 8.2* 5.6*  ALBUMIN 3.8 2.2*   No results for input(s): LIPASE, AMYLASE in the last 168 hours. No results for input(s): AMMONIA in the last 168 hours. Coagulation Profile: Recent Labs  Lab 06/14/18 0950 06/15/18 0453 06/16/18 0347 06/17/18 0403 06/18/18 0316  INR 1.9* 2.1* 1.4* 1.8* 2.4*   Cardiac Enzymes: Recent Labs  Lab 06/17/18 0752 06/17/18 1300 06/17/18 1900  TROPONINI 0.10* 0.10* 0.09*   Urine analysis:    Component Value Date/Time   COLORURINE YELLOW 06/14/2018 1117   APPEARANCEUR CLEAR 06/14/2018 1117   LABSPEC 1.010 06/14/2018 1117   PHURINE 7.0 06/14/2018 1117   GLUCOSEU NEGATIVE 06/14/2018 1117   GLUCOSEU NEGATIVE 06/25/2014 1201   HGBUR MODERATE (A) 06/14/2018 1117   BILIRUBINUR NEGATIVE 06/14/2018 1117   KETONESUR NEGATIVE 06/14/2018 1117   PROTEINUR NEGATIVE 06/14/2018 1117   UROBILINOGEN 0.2 08/27/2014 1954   NITRITE NEGATIVE 06/14/2018 1117   LEUKOCYTESUR NEGATIVE 06/14/2018 1117   Recent Results (from the past 240 hour(s))  Culture, blood (Routine x 2)     Status: Abnormal   Collection Time: 06/14/18  9:50 AM  Result Value Ref Range Status   Specimen Description   Final    BLOOD RIGHT ANTECUBITAL Performed at [facility], [place] [address], [place], [place] [address]    Special Requests   Final    BOTTLES DRAWN AEROBIC AND ANAEROBIC Blood Culture results may not be optimal due to an excessive volume of blood received in culture bottles Performed at [place] [address], [place], [place] [address]    Culture  Setup Time   Final    GRAM POSITIVE COCCI IN BOTH AEROBIC AND ANAEROBIC BOTTLES CRITICAL RESULT CALLED TO, READ BACK BY AND VERIFIED WITH: B GREEN PHARMD 06/14/18 2321 JDW Performed at [facility], [place] [address] [place], [place] [address]    Culture STREPTOCOCCUS GROUP G (A)  Final   Report  Status 06/17/2018 FINAL  Final   Organism ID, Bacteria STREPTOCOCCUS GROUP G  Final      Susceptibility   Streptococcus group g - MIC*    CLINDAMYCIN <=0.25 SENSITIVE Sensitive     AMPICILLIN <=0.25 SENSITIVE Sensitive     ERYTHROMYCIN <=0.12 SENSITIVE Sensitive     VANCOMYCIN <=0.12 SENSITIVE Sensitive     CEFTRIAXONE <=0.12 SENSITIVE Sensitive     LEVOFLOXACIN 0.5 SENSITIVE Sensitive     * STREPTOCOCCUS GROUP G  Blood Culture ID Panel (Reflexed)     Status: Abnormal   Collection Time: 06/14/18  9:50 AM  Result Value Ref Range Status   Enterococcus species NOT DETECTED NOT  DETECTED Final   Listeria monocytogenes NOT DETECTED NOT DETECTED Final   Staphylococcus species NOT DETECTED NOT DETECTED Final   Staphylococcus aureus (BCID) NOT DETECTED NOT DETECTED Final   Streptococcus species DETECTED (A) NOT DETECTED Final    Comment: Not Enterococcus species, Streptococcus agalactiae, Streptococcus pyogenes, or Streptococcus pneumoniae. CRITICAL RESULT CALLED TO, READ BACK BY AND VERIFIED WITH: B GREEN PHARMD 06/14/18 2321 JDW    Streptococcus agalactiae NOT DETECTED NOT DETECTED Final   Streptococcus pneumoniae NOT DETECTED NOT DETECTED Final   Streptococcus pyogenes NOT DETECTED NOT DETECTED Final   Acinetobacter baumannii NOT DETECTED NOT DETECTED Final   Enterobacteriaceae species NOT DETECTED NOT DETECTED Final   Enterobacter cloacae complex NOT DETECTED NOT DETECTED Final   Escherichia coli NOT DETECTED NOT DETECTED Final   Klebsiella oxytoca NOT DETECTED NOT DETECTED Final   Klebsiella pneumoniae NOT DETECTED NOT DETECTED Final   Proteus species NOT DETECTED NOT DETECTED Final   Serratia marcescens NOT DETECTED NOT DETECTED Final   Haemophilus influenzae NOT DETECTED NOT DETECTED Final   Neisseria meningitidis NOT DETECTED NOT DETECTED Final   Pseudomonas aeruginosa NOT DETECTED NOT DETECTED Final   Candida albicans NOT DETECTED NOT DETECTED Final   Candida glabrata NOT DETECTED  NOT DETECTED Final   Candida krusei NOT DETECTED NOT DETECTED Final   Candida parapsilosis NOT DETECTED NOT DETECTED Final   Candida tropicalis NOT DETECTED NOT DETECTED Final    Comment: Performed at [facility], [place] [address] [place], [place] [address]  Culture, Urine     Status: Abnormal   Collection Time: 06/14/18 11:17 AM  Result Value Ref Range Status   Specimen Description   Final    URINE, RANDOM Performed at [place] [address] [place], [place] [address]    Special Requests   Final    NONE Performed at [facility], [place] [address], [place], [place] [address]    Culture (A)  Final    <10,000 COLONIES/mL INSIGNIFICANT GROWTH Performed at [place] [address] [place], [place] [address]    Report Status 06/16/2018 FINAL  Final  Culture, blood (Routine x 2)     Status: None (Preliminary result)   Collection Time: 06/14/18 11:18 AM  Result Value Ref Range Status   Specimen Description   Final    BLOOD RIGHT HAND Performed at [facility] [address] [place], [place] [address]    Special Requests   Final    BOTTLES DRAWN AEROBIC ONLY Blood Culture results may not be optimal due to an inadequate volume of blood received in culture bottles Performed at [place] [address], [place], [place] [address]    Culture   Final    NO GROWTH 4 DAYS Performed at [facility], [place] [address], [place],  [address]    Report Status PENDING  Incomplete  MRSA PCR Screening     Status: None   Collection Time: 06/14/18  5:52 PM  Result Value Ref Range Status   MRSA by PCR NEGATIVE NEGATIVE Final    Comment:        The GeneXpert MRSA Assay (FDA approved for NASAL specimens only), is one component of a comprehensive MRSA colonization surveillance program. It is not intended to diagnose MRSA infection nor to guide or monitor treatment for MRSA  infections. Performed at [facility], [place] [address], [place],  [address]   C difficile  quick scan w PCR reflex     Status: None   Collection Time: 06/15/18  5:35 AM  Result Value Ref Range Status   C Diff antigen NEGATIVE NEGATIVE Final   C Diff toxin NEGATIVE NEGATIVE Final   C Diff interpretation No C. difficile detected.  Final    Comment: Performed at [facility], [place] [address] [place] [address], [place], [place] [address]  Culture, blood (routine x 2)     Status: None (Preliminary result)   Collection Time: 06/17/18  4:03 AM  Result Value Ref Range Status   Specimen Description   Final    BLOOD LEFT ANTECUBITAL Performed at [place] [address], [place], [place] [address]    Special Requests   Final    BOTTLES DRAWN AEROBIC AND ANAEROBIC Blood Culture adequate volume Performed at [facility] [address], [place], [place] [address]    Culture   Final    NO GROWTH < 24 HOURS Performed at [place] [address] [place], [place] [address]    Report Status PENDING  Incomplete  Culture, blood (routine x 2)     Status: None (Preliminary result)   Collection Time: 06/17/18  4:03 AM  Result Value Ref Range Status   Specimen Description   Final    BLOOD LEFT ARM Performed at [facility] [address] [place], [place] [address]    Special Requests   Final    BOTTLES DRAWN AEROBIC ONLY Blood Culture adequate volume Performed at [facility] [address] [place], [place] [address]    Culture   Final    NO GROWTH < 24 HOURS Performed at [place] [address], [place],  [address]    Report Status PENDING  Incomplete      Radiology Studies: Dg Chest Port 1 View  Result Date: 06/16/2018 CLINICAL DATA:  Evaluate PICC line placement. EXAM: PORTABLE CHEST 1 VIEW COMPARISON:  June 14, 2018 FINDINGS: A right PICC line terminates  in the central SVC. Bilateral pleural effusions, right greater than left, are more pronounced on this frontal view compared to the previous frontal view, likely due to difference in positioning and layering of the effusions. Cardiomegaly. The hila and mediastinum are normal. No pneumothorax. No nodules or masses. IMPRESSION: 1. The right PICC line is in good position. 2. Bilateral pleural effusions, right greater than left, with under lying atelectasis. Electronically Signed   By: Dorise Bullion III M.D   On: 06/16/2018 18:28   [place] Ekg Site Rite  Result Date: 06/16/2018 If Site Rite image not attached, placement could not be confirmed due to current cardiac rhythm.   Scheduled Meds: . atorvastatin  20 mg Oral Daily  . busPIRone  15 mg Oral BID  . cycloSPORINE  1 drop Both Eyes BID  . diltiazem  120 mg Oral BID  . feeding supplement (ENSURE ENLIVE)  237 mL Oral BID BM  . feeding supplement (PRO-STAT SUGAR FREE 64)  30 mL Oral BID  . multivitamin  1 tablet Oral BID  . oxybutynin  5 mg Oral Daily  . pantoprazole  20 mg Oral Daily  . polyvinyl alcohol  1 drop Both Eyes BID  . sodium chloride flush  10-40 mL Intracatheter Q12H  . sodium chloride flush  3 mL Intravenous Once  . warfarin  2.5 mg Oral ONCE-1800  . Warfarin - Pharmacist Dosing Inpatient   Does not apply  q1800   Continuous Infusions: . cefTRIAXone (ROCEPHIN)  IV 2 g (06/18/18 0858)     LOS: 3 days   Time spent: 35 minutes.  Patrecia Pour, MD [facility] [email or web address] Password [facility] 06/18/2018, 12:09 PM

## 2018-06-18 NOTE — Consult Note (Signed)
[place] Nurse wound consult note Reason for Consult:Nonhealing vascular wound to left lateral foot. Present on admission. Seen at wound care center. Using silver collagen and hydrogel.  I will continue this as her family has provided supplies.  Wound type:vascular Has vasculitis with red, speckled tender rash.  New onset, per daughter.   Pressure Injury POA: Yes Measurement:0.3 cm round, scabbed lesion. Recent increase in drainage.   Right hand, wrist and forearm with nonapproximated skin tear from recent fall.  Right wrist:  13 cm length x 0.5 cm width and moderate serosanguinous drainage.  Wound JKK:[id number] Drainage (amount, consistency, odor) increase in serosanguinous  No odor Periwound:erythema and tender to touch Dressing procedure/placement/frequency: Cleanse left foot with NS and pat dry. Apply pea sized amount hydrogel to wound bed. Apply silver collagen to wound bed.  Cover with foam dressing. Change M/W/F Cleanse skin tear to right arm with NS.  Apply saline moist Aquacel Ag to wound bed. Cover with dry dressing and gauze/tape.  Change M/W/F Will not follow at this time.  Please re-consult if needed.  Domenic Moras MSN, RN, FNP-BC CWON Wound, Ostomy, Continence Nurse Pager [phone]

## 2018-06-18 NOTE — Progress Notes (Signed)
RN assumed care of patient at this time and agrees with previous RN's assessment.

## 2018-06-18 NOTE — Progress Notes (Signed)
Brittany [name] for warfarin Indication: atrial fibrillation and aortic valve replacement  Allergies  Allergen Reactions  . Amlodipine Swelling and Other (See Comments)     Unspecified swelling reaction  . Amoxicillin Diarrhea, Nausea And Vomiting and Other (See Comments)    Has patient had a PCN reaction causing immediate rash, facial/tongue/throat swelling, SOB or lightheadedness with hypotension: No Has patient had a PCN reaction causing severe rash involving mucus membranes or skin necrosis: No Has patient had a PCN reaction that required hospitalization: No Has patient had a PCN reaction occurring within the last 10 years: No If all of the above answers are "NO", then may proceed with Cephalosporin use.  . Carafate [Sucralfate] Swelling and Other (See Comments)    Reaction:  Knee swelling/redness   . Cephalexin Diarrhea  . Codeine Nausea And Vomiting  . Irbesartan Swelling and Other (See Comments)    Reaction:  Facial/hand swelling and numbness   . Morphine And Related Other (See Comments)    Pt states that she has a history of addiction with this medication.      . Neurontin [Gabapentin] Swelling and Other (See Comments)    Reaction:  Leg swelling   . Nitrofurantoin Monohyd Macro Diarrhea and Nausea And Vomiting  . Prednisone Other (See Comments)    Reaction:  Elevated BP  . Sulfa Antibiotics Rash    Patient Measurements: Height: 5\' 1"  (154.9 cm) Weight: 137 lb 2 oz (62.2 kg) IBW/kg (Calculated) : 47.8  Vital Signs: Temp: 98.3 F (36.8 C) (03/09 0324) Temp Source: Oral (03/09 0324) BP: 125/58 (03/09 0324) Pulse Rate: 65 (03/09 0324)  Labs: Recent Labs    06/16/18 0347 06/17/18 0403 06/17/18 0752 06/17/18 0823 06/17/18 1300 06/17/18 1900 06/18/18 0316  HGB 10.4*  --   --  9.2*  --   --  9.0*  HCT 33.2*  --   --  28.9*  --   --  28.4*  PLT 154  --   --  147*  --   --  162  LABPROT 17.2* 20.2*  --   --   --   --  25.7*  INR  1.4* 1.8*  --   --   --   --  2.4*  CREATININE 0.67  --  0.56  --   --   --  0.76  TROPONINI  --   --  0.10*  --  0.10* 0.09*  --     Estimated Creatinine Clearance: 40.3 mL/min (by C-G formula based on SCr of 0.76 mg/dL).   Medical History: Past Medical History:  Diagnosis Date  . Abnormal liver diagnostic imaging    suspected cirrhosis  . Arthritis   . Atrial fibrillation ([place])   . Chronic back pain   . Diverticulosis   . Dyslipidemia    takes Lipitor daily  . Dysrhythmia    Atrial Fibrillation  . Foot drop    SINCE 1987  . GERD (gastroesophageal reflux disease)   . H/O hiatal hernia   . Hearing impaired    Bilateral hearing aids  . History of bacterial endocarditis   . History of gastritis   . History of stomach ulcers    many yrs ago  . History of TIA (transient ischemic attack)    2013-- RESIDUAL PERIPHERAL VISION RIGHT EYE--  RESOLVED  . Hypertension   . IC (interstitial cystitis)   . Macular degeneration   . Pulmonary hypertension ([place]) 10/2013   Based on TTE  .  S/P aortic valve replacement    2005  . Urine incontinence     Medications:  Scheduled:  . atorvastatin  20 mg Oral Daily  . busPIRone  15 mg Oral BID  . cycloSPORINE  1 drop Both Eyes BID  . diltiazem  120 mg Oral BID  . enoxaparin (LOVENOX) injection  40 mg Subcutaneous Q24H  . feeding supplement (ENSURE ENLIVE)  237 mL Oral BID BM  . feeding supplement (PRO-STAT SUGAR FREE 64)  30 mL Oral BID  . multivitamin  1 tablet Oral BID  . oxybutynin  5 mg Oral Daily  . pantoprazole  20 mg Oral Daily  . polyvinyl alcohol  1 drop Both Eyes BID  . sodium chloride flush  10-40 mL Intracatheter Q12H  . sodium chloride flush  3 mL Intravenous Once  . Warfarin - Pharmacist Dosing Inpatient   Does not apply q1800   Infusions:  . cefTRIAXone (ROCEPHIN)  IV 2 g (06/18/18 6967)    Assessment: 85 yo female presented to ER after fall with fever and tachycardia. Pt takes warfarin PTA for atrial fibrillation  and history of aortic valve replacement. Pharmacy consulted to dose/monitor warfarin during admission. INR 1.9 therapeutic on admission. LD PTA 3/4  Home dose/INR goal (confirmed with outpatient clinic note from 06/08/18): Dose: Warfarin 2.5 mg on Tuesday & Sat; 5 mg on all other days of the week    Today, 06/18/18  INR 2.4 is therapeutic and significantly increased  Hgb 9 decreased from admission but stable from yesterday.   Plt - improved  Pt is on antibiotic (ceftriaxone) which can increase warfarin sensitivity.  Warfarin dose 3/5 not charted as being given.  Goal of Therapy:  INR 2-3   Plan:   Decrease Warfarin 2.5 mg PO once this evening  Stop enoxaparin since INR therapeutic (already received dose today  INR daily  Closely monitor for signs/symptoms of bleeding or thrombosis  Peggyann Juba, PharmD, BCPS Pager: [phone] 06/18/2018, 9:12 AM

## 2018-06-18 NOTE — Evaluation (Signed)
Occupational Therapy Evaluation Patient Details Name: Brittany [name] MRN: [id number] DOB: Jun 15, 1927 Today's Date: 06/18/2018    History of Present Illness 85 y.o. female with a history of cirrhosis, chronic AFib, chronic back pain, left foot drop, HTN, gastritis/GERD, pulmonary HTN, AVR 2005 on coumadin, and bacterial endocarditis who presented from ALF with fever, weakness, and an unwitnessed fall. She was found to be febrile and admitted for Sepsis due to group G strep bacteremia, source suspected to be left foot ulcer with cellulitis   Clinical Impression   Pt admitted s/p a fall at her ALF. Pt currently with functional limitations due to the deficits listed below (see OT Problem List).  Pt will benefit from skilled OT to increase their safety and independence with ADL and functional mobility for ADL to facilitate discharge to venue listed below.      Follow Up Recommendations  Home health OT;Supervision/Assistance - 24 hour    Equipment Recommendations  Other (comment)    Recommendations for Other Services       Precautions / Restrictions Precautions Precautions: Fall Precaution Comments: L foot drop (unable to wear AFO since last year due to chronic wound)      Mobility Bed Mobility Overal bed mobility: Needs Assistance Bed Mobility: Supine to Sit     Supine to sit: Min assist;HOB elevated        Transfers Overall transfer level: Needs assistance Equipment used: Rolling walker (2 wheeled) Transfers: Sit to/from [facility] Sit to Stand: Min assist Stand pivot transfers: Min assist            Balance Overall balance assessment: History of Falls                                         ADL either performed or assessed with clinical judgement   ADL Overall ADL's : Needs assistance/impaired Eating/Feeding: Minimal assistance;Sitting Eating/Feeding Details (indicate cue type and reason): due to vision and lack of adequate   light in room.  Sons reports they do feed patient sometimes Grooming: Minimal assistance;Sitting           Upper Body Dressing : Set up;Sitting   Lower Body Dressing: Moderate assistance;Sit to/from stand;Cueing for safety;Cueing for compensatory techniques   Toilet Transfer: Minimal assistance;BSC;RW;Stand-pivot;Cueing for safety   Toileting- Clothing Manipulation and Hygiene: Moderate assistance;Sit to/from stand;Cueing for compensatory techniques;Cueing for safety               Vision Baseline Vision/History: Macular Degeneration              Pertinent Vitals/Pain Pain Assessment: No/denies pain     Hand Dominance     Extremity/Trunk Assessment Upper Extremity Assessment Upper Extremity Assessment: Generalized weakness           Communication Communication Communication: HOH   Cognition Arousal/Alertness: Awake/alert Behavior During Therapy: WFL for tasks assessed/performed Overall Cognitive Status: Within Functional Limits for tasks assessed                                                Home Living Family/patient expects to be discharged to:: Assisted living  Home Equipment: [place] - 2 wheels          Prior Functioning/Environment Level of Independence: Needs assistance  Gait / Transfers Assistance Needed: ambulates with RW, receiving HHPT prior to this admission              OT Problem List: Decreased strength;Decreased activity tolerance;Impaired balance (sitting and/or standing)      OT Treatment/Interventions: Self-care/ADL training;Patient/family education;DME and/or AE instruction;Therapeutic activities    OT Goals(Current goals can be found in the care plan section) Acute Rehab OT Goals Patient Stated Goal: back to ALF OT Goal Formulation: With patient Time For Goal Achievement: 07/02/18  OT Frequency: Min 2X/week    AM-PAC OT "6 Clicks" Daily Activity     Outcome  Measure Help from another person eating meals?: A Little Help from another person taking care of personal grooming?: A Little Help from another person toileting, which includes using toliet, bedpan, or urinal?: A Lot Help from another person bathing (including washing, rinsing, drying)?: A Little Help from another person to put on and taking off regular upper body clothing?: A Little Help from another person to put on and taking off regular lower body clothing?: A Lot 6 Click Score: 16   End of Session Equipment Utilized During Treatment: Rolling walker;Oxygen Nurse Communication: Mobility status  Activity Tolerance: Patient limited by fatigue Patient left: in chair;with call bell/phone within reach;with chair alarm set;with family/visitor present  OT Visit Diagnosis: Unsteadiness on feet (R26.81);Repeated falls (R29.6);Muscle weakness (generalized) (M62.81);History of falling (Z91.81)                Time: 1710-1726 OT Time Calculation (min): 16 min Charges:  OT General Charges $OT Visit: 1 Visit OT Evaluation $OT Eval Moderate Complexity: 1 Mod  Kari Baars, [place] [phone] Office- [phone], Edwena Felty D 06/18/2018, 6:49 PM

## 2018-06-18 NOTE — Progress Notes (Signed)
Patient c/o left side chest pressure 8/10.  Patient resting in bed.  Also c/o lower back pain 9/10.  Dr. Bonner Puna notified.  Obtaining VS.

## 2018-06-18 NOTE — Progress Notes (Signed)
INFECTIOUS DISEASE PROGRESS NOTE  ID: Brittany [name] is a 85 y.o. female with  Principal Problem:   Sepsis secondary to UTI [facility]) Active Problems:   Aortic stenosis   Hypertension   Chest pain   Chronic atrial fibrillation (HCC)   Esophageal reflux   Hyponatremia   Diarrhea   Dementia (HCC)   Chronic ulcer of left foot (HCC)  Subjective: Feels better  Abtx:  Anti-infectives (From admission, onward)   Start     Dose/Rate Route Frequency Ordered Stop   06/15/18 0500  cefTRIAXone (ROCEPHIN) 2 g in sodium chloride 0.9 % 100 mL IVPB     2 g 200 mL/hr over 30 Minutes Intravenous Daily 06/15/18 0131     06/14/18 2200  ceFEPIme (MAXIPIME) 1 g in sodium chloride 0.9 % 100 mL IVPB  Status:  Discontinued     1 g 200 mL/hr over 30 Minutes Intravenous Every 12 hours 06/14/18 1150 06/15/18 0130   06/14/18 1015  ceFEPIme (MAXIPIME) 2 g in sodium chloride 0.9 % 100 mL IVPB     2 g 200 mL/hr over 30 Minutes Intravenous  Once 06/14/18 1007 06/14/18 1253   06/14/18 1015  vancomycin (VANCOCIN) IVPB 1000 mg/200 mL premix     1,000 mg 200 mL/hr over 60 Minutes Intravenous  Once 06/14/18 1007 06/14/18 1352      Medications:  Scheduled: . atorvastatin  20 mg Oral Daily  . busPIRone  15 mg Oral BID  . cycloSPORINE  1 drop Both Eyes BID  . diltiazem  120 mg Oral BID  . feeding supplement (ENSURE ENLIVE)  237 mL Oral BID BM  . feeding supplement (PRO-STAT SUGAR FREE 64)  30 mL Oral BID  . [START ON 06/19/2018] isosorbide mononitrate  15 mg Oral Daily  . multivitamin  1 tablet Oral BID  . oxybutynin  5 mg Oral Daily  . pantoprazole  20 mg Oral Daily  . polyvinyl alcohol  1 drop Both Eyes BID  . sodium chloride flush  10-40 mL Intracatheter Q12H  . sodium chloride flush  3 mL Intravenous Once  . warfarin  2.5 mg Oral ONCE-1800  . Warfarin - Pharmacist Dosing Inpatient   Does not apply q1800    Objective: Vital signs in last 24 hours: Temp:  [98.1 F (36.7 C)-98.7 F (37.1 C)] 98.3  F (36.8 C) (03/09 0324) Pulse Rate:  [58-67] 65 (03/09 0324) Resp:  [15-16] 16 (03/09 0324) BP: (94-125)/(52-58) 125/58 (03/09 0324) SpO2:  [98 %-100 %] 98 % (03/09 0324)   General appearance: alert, cooperative and no distress Resp: clear to auscultation bilaterally Cardio: regular rate and rhythm and systolic murmur: holosystolic 4/6, crescendo at 2nd left intercostal space, at 2nd right intercostal space GI: normal findings: bowel sounds normal and soft, non-tender Extremities: well demarcated erythematous area on LLE. mild warmth. small wound on lateral foot, clean.   Lab Results Recent Labs    06/17/18 0752 06/17/18 0823 06/18/18 0316  WBC  --  18.6* 13.8*  HGB  --  9.2* 9.0*  HCT  --  28.9* 28.4*  NA 128*  --  132*  K 4.3  --  4.0  CL 96*  --  95*  CO2 27  --  30  BUN 16  --  20  CREATININE 0.56  --  0.76   Liver Panel Recent Labs    06/17/18 0752  PROT 5.6*  ALBUMIN 2.2*  AST 28  ALT 22  ALKPHOS 96  BILITOT 0.3  Sedimentation Rate No results for input(s): ESRSEDRATE in the last 72 hours. C-Reactive Protein No results for input(s): CRP in the last 72 hours.  Microbiology: Recent Results (from the past 240 hour(s))  Culture, blood (Routine x 2)     Status: Abnormal   Collection Time: 06/14/18  9:50 AM  Result Value Ref Range Status   Specimen Description   Final    BLOOD RIGHT ANTECUBITAL Performed at [facility] [address], [place], [place] [address]    Special Requests   Final    BOTTLES DRAWN AEROBIC AND ANAEROBIC Blood Culture results may not be optimal due to an excessive volume of blood received in culture bottles Performed at [facility] [address], [place], [place] [address]    Culture  Setup Time   Final    GRAM POSITIVE COCCI IN BOTH AEROBIC AND ANAEROBIC BOTTLES CRITICAL RESULT CALLED TO, READ BACK BY AND VERIFIED WITH: B GREEN PHARMD 06/14/18 2321 JDW Performed at [facility],  [place] [address] [place], [place] [address]    Culture STREPTOCOCCUS GROUP G (A)  Final   Report Status 06/17/2018 FINAL  Final   Organism ID, Bacteria STREPTOCOCCUS GROUP G  Final      Susceptibility   Streptococcus group g - MIC*    CLINDAMYCIN <=0.25 SENSITIVE Sensitive     AMPICILLIN <=0.25 SENSITIVE Sensitive     ERYTHROMYCIN <=0.12 SENSITIVE Sensitive     VANCOMYCIN <=0.12 SENSITIVE Sensitive     CEFTRIAXONE <=0.12 SENSITIVE Sensitive     LEVOFLOXACIN 0.5 SENSITIVE Sensitive     * STREPTOCOCCUS GROUP G  Blood Culture ID Panel (Reflexed)     Status: Abnormal   Collection Time: 06/14/18  9:50 AM  Result Value Ref Range Status   Enterococcus species NOT DETECTED NOT DETECTED Final   Listeria monocytogenes NOT DETECTED NOT DETECTED Final   Staphylococcus species NOT DETECTED NOT DETECTED Final   Staphylococcus aureus (BCID) NOT DETECTED NOT DETECTED Final   Streptococcus species DETECTED (A) NOT DETECTED Final    Comment: Not Enterococcus species, Streptococcus agalactiae, Streptococcus pyogenes, or Streptococcus pneumoniae. CRITICAL RESULT CALLED TO, READ BACK BY AND VERIFIED WITH: B GREEN PHARMD 06/14/18 2321 JDW    Streptococcus agalactiae NOT DETECTED NOT DETECTED Final   Streptococcus pneumoniae NOT DETECTED NOT DETECTED Final   Streptococcus pyogenes NOT DETECTED NOT DETECTED Final   Acinetobacter baumannii NOT DETECTED NOT DETECTED Final   Enterobacteriaceae species NOT DETECTED NOT DETECTED Final   Enterobacter cloacae complex NOT DETECTED NOT DETECTED Final   Escherichia coli NOT DETECTED NOT DETECTED Final   Klebsiella oxytoca NOT DETECTED NOT DETECTED Final   Klebsiella pneumoniae NOT DETECTED NOT DETECTED Final   Proteus species NOT DETECTED NOT DETECTED Final   Serratia marcescens NOT DETECTED NOT DETECTED Final   Haemophilus influenzae NOT DETECTED NOT DETECTED Final   Neisseria meningitidis NOT DETECTED NOT DETECTED Final   Pseudomonas aeruginosa NOT DETECTED NOT  DETECTED Final   Candida albicans NOT DETECTED NOT DETECTED Final   Candida glabrata NOT DETECTED NOT DETECTED Final   Candida krusei NOT DETECTED NOT DETECTED Final   Candida parapsilosis NOT DETECTED NOT DETECTED Final   Candida tropicalis NOT DETECTED NOT DETECTED Final    Comment: Performed at [facility], [place] [address], [place], [place] [address]  Culture, Urine     Status: Abnormal   Collection Time: 06/14/18 11:17 AM  Result Value Ref Range Status   Specimen Description  Final    URINE, RANDOM Performed at [facility], [place] [address] [place], [place] [address]    Special Requests   Final    NONE Performed at [facility], [place] [address] [place], [place] [address]    Culture (A)  Final    <10,000 COLONIES/mL INSIGNIFICANT GROWTH Performed at [facility] [address], [place], [place] [address]    Report Status 06/16/2018 FINAL  Final  Culture, blood (Routine x 2)     Status: None (Preliminary result)   Collection Time: 06/14/18 11:18 AM  Result Value Ref Range Status   Specimen Description   Final    BLOOD RIGHT HAND Performed at [facility] [address], [place], [place] [address]    Special Requests   Final    BOTTLES DRAWN AEROBIC ONLY Blood Culture results may not be optimal due to an inadequate volume of blood received in culture bottles Performed at [facility] [address] [place], [place] [address]    Culture   Final    NO GROWTH 4 DAYS Performed at [facility], [place] [address] [place], [place] [address]    Report Status PENDING  Incomplete  MRSA PCR Screening     Status: None   Collection Time: 06/14/18  5:52 PM  Result Value Ref Range Status   MRSA by PCR NEGATIVE NEGATIVE Final    Comment:        The GeneXpert MRSA Assay (FDA approved for NASAL specimens only), is one component of a comprehensive MRSA colonization surveillance program. It  is not intended to diagnose MRSA infection nor to guide or monitor treatment for MRSA infections. Performed at [facility], [place] [address] [place], [place] [address]   C difficile quick scan w PCR reflex     Status: None   Collection Time: 06/15/18  5:35 AM  Result Value Ref Range Status   C Diff antigen NEGATIVE NEGATIVE Final   C Diff toxin NEGATIVE NEGATIVE Final   C Diff interpretation No C. difficile detected.  Final    Comment: Performed at [facility], [place] [address] [place], [place] [address]  Culture, blood (routine x 2)     Status: None (Preliminary result)   Collection Time: 06/17/18  4:03 AM  Result Value Ref Range Status   Specimen Description   Final    BLOOD LEFT ANTECUBITAL Performed at [facility] [address] [place], [place] [address]    Special Requests   Final    BOTTLES DRAWN AEROBIC AND ANAEROBIC Blood Culture adequate volume Performed at [facility] [address], [place], [place] [address]    Culture   Final    NO GROWTH < 24 HOURS Performed at [facility] [address] [place], [place] [address]    Report Status PENDING  Incomplete  Culture, blood (routine x 2)     Status: None (Preliminary result)   Collection Time: 06/17/18  4:03 AM  Result Value Ref Range Status   Specimen Description   Final    BLOOD LEFT ARM Performed at [facility] [address], [place], [place] [address]    Special Requests   Final    BOTTLES DRAWN AEROBIC ONLY Blood Culture adequate volume Performed at [facility] [address] [place],  [address]    Culture   Final  NO GROWTH < 24 HOURS Performed at [facility] [address], [place], [place] [address]    Report Status PENDING  Incomplete    Studies/Results: Dg Chest Port 1 View  Result Date: 06/16/2018 CLINICAL DATA:  Evaluate PICC line placement. EXAM:  PORTABLE CHEST 1 VIEW COMPARISON:  June 14, 2018 FINDINGS: A right PICC line terminates in the central SVC. Bilateral pleural effusions, right greater than left, are more pronounced on this frontal view compared to the previous frontal view, likely due to difference in positioning and layering of the effusions. Cardiomegaly. The hila and mediastinum are normal. No pneumothorax. No nodules or masses. IMPRESSION: 1. The right PICC line is in good position. 2. Bilateral pleural effusions, right greater than left, with under lying atelectasis. Electronically Signed   By: Dorise Bullion III M.D   On: 06/16/2018 18:28     Assessment/Plan: L leg cellulitis Group G strep bacteremia Previous AVR 2005 L foot ulcer Recurrent UTI CP, +troponin  Total days of antibiotics: 3 ceftriaxone  Appreciate CV eval. Pt would like to have TEE WBC better (33.6 --> 13.8) Repeat BCx 3-8 are ngtd Continue ceftriaxone vascular eval of LLE?         Bobby Rumpf MD, FACP Infectious Diseases (pager) [phone] [email or web address] 06/18/2018, 1:05 PM  LOS: 3 days

## 2018-06-18 NOTE — Care Management Important Message (Signed)
Important Message  Patient Details  Name: Brittany [name] MRN: [id number] Date of Birth: [birth year]   Medicare Important Message Given:  Yes    Beau [place] 06/18/2018, 8:53 AM

## 2018-06-18 NOTE — Progress Notes (Addendum)
Progress Note  [name] Name: Brittany [name] Date of Encounter: 06/18/2018  Primary Cardiologist: Mertie Moores, MD   Subjective   No significant overnight events. [name] does report an episode of chest pressure this morning that she ranks as a 7/10 on Brittany pain and lasted for about 45 minutes before resolving with Brittany "pain medication" she took for her back. She notes some nausea with Brittany pain. No shortness of breath, palpitations, lightheadedness, or dizziness. [name] does state she is feeling better than she did yesterday.  Inpatient Medications    Scheduled Meds: . atorvastatin  20 mg Oral Daily  . busPIRone  15 mg Oral BID  . cycloSPORINE  1 drop Both Eyes BID  . diltiazem  120 mg Oral BID  . feeding supplement (ENSURE ENLIVE)  237 mL Oral BID BM  . feeding supplement (PRO-STAT SUGAR FREE 64)  30 mL Oral BID  . multivitamin  1 tablet Oral BID  . oxybutynin  5 mg Oral Daily  . pantoprazole  20 mg Oral Daily  . polyvinyl alcohol  1 drop Both Eyes BID  . sodium chloride flush  10-40 mL Intracatheter Q12H  . sodium chloride flush  3 mL Intravenous Once  . warfarin  2.5 mg Oral ONCE-1800  . Warfarin - Pharmacist Dosing Inpatient   Does not apply q1800   Continuous Infusions: . cefTRIAXone (ROCEPHIN)  IV 2 g (06/18/18 0858)   PRN Meds: acetaminophen **OR** acetaminophen, alum & mag hydroxide-simeth, HYDROcodone-acetaminophen, morphine injection, nitroGLYCERIN, ondansetron **OR** ondansetron (ZOFRAN) IV, promethazine, senna-docusate, sodium chloride flush, sorbitol   Vital Signs    Vitals:   06/17/18 1521 06/17/18 1705 06/17/18 2049 06/18/18 0324  BP: (!) 100/54 (!) 112/55 (!) 125/58 (!) 125/58  Pulse: 64 (!) 59 65 65  Resp:  16 15 16   Temp:  98.1 F (36.7 C) 98.7 F (37.1 C) 98.3 F (36.8 C)  TempSrc:  Oral Oral Oral  SpO2: 100% 100% 100% 98%  Weight:      Height:        Intake/Output Summary (Last 24 hours) at 06/18/2018 1120 Last data filed at 06/18/2018  0900 Gross per 24 hour  Intake 120 ml  Output 1000 ml  Net -880 ml   Filed Weights   06/14/18 0947 06/14/18 1609 06/15/18 1306  Weight: 47.6 kg 47.6 kg 62.2 kg    Telemetry    Atrial fibrillation with ventricular rate in Brittany 60's to 90's. - Personally Reviewed  ECG    No new ECG tracings today. - Personally Reviewed  Physical Exam   QHU:TMLYYTKPT female resting comfortably. Alert and in no acute distress.   Neck: Supple. Cardiac: Irregularly irregular rhythm with normal rate. II/VI systolic murmur. No gallops or rubs appreciated.  Respiratory: No increased work of breathing. Diminished breath sounds with minimal crackles noted in bases. GI: Abdomen soft, non-distended, and non-tender. Bowel sounds present. MS: No lower extremity edema. Left lower extremity with significant rubor warmth has improved. No deformity. Skin: Warm and dry. Neuro:  No focal deficits. Psych: Normal affect. Responds appropriately.  Labs    Chemistry Recent Labs  Lab 06/14/18 [phone]  06/16/18 0347 06/17/18 0752 06/18/18 0316  NA 130*   < > 129* 128* 132*  K 4.1   < > 4.3 4.3 4.0  CL 95*   < > 99 96* 95*  CO2 25   < > 21* 27 30  GLUCOSE 91   < > 101* 98 122*  BUN 18   < >  16 16 20   CREATININE 0.78   < > 0.67 0.56 0.76  CALCIUM 8.9   < > 7.9* 7.6* 7.8*  PROT 8.2*  --   --  5.6*  --   ALBUMIN 3.8  --   --  2.2*  --   AST 31  --   --  28  --   ALT 26  --   --  22  --   ALKPHOS 90  --   --  96  --   BILITOT 1.2  --   --  0.3  --   GFRNONAA >60   < > >60 >60 >60  GFRAA >60   < > >60 >60 >60  ANIONGAP 10   < > 9 5 7    < > = values in this interval not displayed.     Hematology Recent Labs  Lab 06/16/18 0347 06/17/18 0823 06/18/18 0316  WBC 29.2* 18.6* 13.8*  RBC 3.50* 3.15* 3.08*  HGB 10.4* 9.2* 9.0*  HCT 33.2* 28.9* 28.4*  MCV 94.9 91.7 92.2  MCH 29.7 29.2 29.2  MCHC 31.3 31.8 31.7  RDW 13.7 13.6 13.6  PLT 154 147* 162    Cardiac Enzymes Recent Labs  Lab 06/17/18 0752  06/17/18 1300 06/17/18 1900  TROPONINI 0.10* 0.10* 0.09*   No results for input(s): TROPIPOC in Brittany last 168 hours.   BNPNo results for input(s): BNP, PROBNP in Brittany last 168 hours.   DDimer No results for input(s): DDIMER in Brittany last 168 hours.   Radiology    Dg Chest Port 1 View  Result Date: 06/16/2018 CLINICAL DATA:  Evaluate PICC line placement. EXAM: PORTABLE CHEST 1 VIEW COMPARISON:  June 14, 2018 FINDINGS: A right PICC line terminates in Brittany central SVC. Bilateral pleural effusions, right greater than left, are more pronounced on this frontal view compared to Brittany previous frontal view, likely due to difference in positioning and layering of Brittany effusions. Cardiomegaly. Brittany hila and mediastinum are normal. No pneumothorax. No nodules or masses. IMPRESSION: 1. Brittany right PICC line is in good position. 2. Bilateral pleural effusions, right greater than left, with under lying atelectasis. Electronically Signed   By: Dorise Bullion III M.D   On: 06/16/2018 18:28   [place] Ekg Site Rite  Result Date: 06/16/2018 If Site Rite image not attached, placement could not be confirmed due to current cardiac rhythm.   Cardiac Studies   Echocardiogram 06/16/2018: Impressions:  1. Brittany left ventricle has hyperdynamic systolic function, with an ejection fraction of >65%. Brittany cavity size was normal. There is moderate concentric left ventricular hypertrophy. Left ventricular diastology could not be evaluated secondary to atrial  fibrillation.  2. Brittany right ventricle has normal systolic function. Brittany cavity was normal. There is no increase in right ventricular wall thickness. Right ventricular systolic pressure is severely elevated with an estimated pressure of 64.4 mmHg.  3. Left atrial size was severely dilated.  4. Right atrial size was severely dilated.  5. Brittany mitral valve is degenerative. Moderate thickening of Brittany mitral valve leaflet. Moderate calcification of Brittany mitral valve leaflet. There is severe  mitral annular calcification present. Mitral valve regurgitation is mild to moderate by color flow  Doppler.  6. Brittany tricuspid valve is normal in structure. Tricuspid valve regurgitation is severe.  7. Brittany aortic valve is tricuspid Moderate thickening of Brittany aortic valve Moderate calcification of Brittany aortic valve. Moderate-severe stenosis of Brittany aortic valve. No vegetaion is seen.  8. Brittany pulmonic valve was  normal in structure.  9. Brittany inferior vena cava was dilated in size with <50% respiratory variability.  [name] Profile   Brittany [name] is a 84 y.o. female with a history of aortic valve replacement, atrial fibrillation, prior stroke with right visual field defect, hypertension, and hyperlipidemia who was admitted for fever, weakness, and a fall. She was found to be bacteremic. Cardiology was consulted for evaluation of chest pain which was nitrate responsive and an elevated troponin.   Assessment & Plan    Chest Pain - Troponin minimally elevated and flat at 0.10 >> 0.10 >> 0.09. Not consistent with ACS. Possibly demand ischemia secondary to bacteremia. - Echo showed LVEF of >65%. Right ventricular systolic pressure severely elevated with an estimated pressure of 64.4 mmHg. Moderate to severe aortic stenosis and mild to moderate mitral regurgitation noted. - [name] had another episode of chest pressure this morning that resolved with Vicodin. Currently chest pain free.  - Will continue to monitor. [name] would not be a candidate for catheterization at this time due to bacteremia but will discuss possible ischemic evaluation with MD if chest pain continues.  Atrial Fibrillation - Telemetry shows atrial fibrillation with ventricular rates in Brittany 60's to 90's.  - Continue Diltiazem 120mg  daily. - Continue chronic anticoagulation with Coumadin.  Bacteremia - Infectious Disease has recommended TEE to help guide length of antibiotic treatment.  After careful review of history and examination,  Brittany risks and benefits of transesophageal echocardiogram have been explained including risks of esophageal damage, perforation (1:10,000 risk), bleeding, pharyngeal hematoma as well as other potential complications associated with conscious sedation including aspiration, arrhythmia, respiratory failure and death. Alternatives to treatment were discussed, questions were answered. [name] is willing to proceed.   Darreld Mclean, PA-C 06/18/2018 12:15 PM   For questions or updates, please contact [place] Please consult [email or web address] for contact info under Cardiology/STEMI.      Signed, Darreld Mclean, PA-C  06/18/2018, 11:20 AM    ---------------------------------------------------------------------------------------------   History and all data above reviewed.  [name] examined.  I agree with Brittany findings as above.  Brittany [name] feels well and is mentating clearly.  My colleague Sande Rives, PA-C has consented Brittany [name] for TEE, and Brittany [name] agrees, and understands Brittany risks of sedation as well as TEE as well as Brittany benefits and alternatives of performing this test.  Constitutional: No acute distress Cardiovascular: Irregular rhythm, normal rate, 1-2 out of 6 systolic murmur . S1 and S2 normal. Radial pulses normal bilaterally. No jugular venous distention.  Respiratory: clear to auscultation bilaterally GI : normal bowel sounds, soft and nontender. No distention.   MSK: extremities warm, well perfused. No edema.  NEURO: grossly nonfocal exam, moves all extremities. PSYCH: alert and oriented x 3, normal mood and affect.   All available labs, radiology testing, previous records reviewed. Agree with documented assessment and plan of my colleague as stated above with Brittany following additions or changes:  Principal Problem:   Sepsis secondary to UTI [facility]) Active Problems:   Aortic stenosis   Hypertension   Chest pain   Chronic atrial fibrillation (HCC)   Esophageal  reflux   Hyponatremia   Diarrhea   Dementia (HCC)   Chronic ulcer of left foot ([place])   Streptococcal bacteremia    Plan: Plan for TEE to evaluate for bioprosthetic valve endocarditis.  This will likely be accomplished earliest by Wednesday of this week.  I discussed Brittany risk benefits and alternatives of this procedure with Brittany  [name] and her daughter, and have discussed indications for sedation during TEE.  For chest pain we will plan to initiate Imdur.  It is unclear at this time if this represents ischemic chest pain, however troponins were minimally elevated on presentation, unlikely to be related to ACS.  She is rate controlled in atrial fibrillation also unlikely to be a contributor.  She has had a normal stress Myoview in 2013, a repeat stress test can be considered if Brittany [name] is agreeable potentially as an outpatient.  Time Spent Directly with [name]:  I have spent a total of 35 minutes with Brittany [name] reviewing hospital notes, telemetry, EKGs, labs and examining Brittany [name] as well as establishing an assessment and plan that was discussed personally with Brittany [name].  > 50% of time was spent in direct [name] care.  Length of Stay:  LOS: 3 days   Elouise Munroe, MD [facility] 3:09 PM  06/18/2018

## 2018-06-18 NOTE — Evaluation (Signed)
Physical Therapy Evaluation Patient Details Name: Brittany [name] MRN: [id number] DOB: October 11, 1927 Today's Date: 06/18/2018   History of Present Illness  85 y.o. female with a history of cirrhosis, chronic AFib, chronic back pain, left foot drop, HTN, gastritis/GERD, pulmonary HTN, AVR 2005 on coumadin, and bacterial endocarditis who presented from ALF with fever, weakness, and an unwitnessed fall. She was found to be febrile and admitted for Sepsis due to group G strep bacteremia, source suspected to be left foot ulcer with cellulitis  Clinical Impression  Pt admitted with above diagnosis. Pt currently with functional limitations due to the deficits listed below (see PT Problem List).  Pt will benefit from skilled PT to increase their independence and safety with mobility to allow discharge to the venue listed below.  Pt assisted with transfer to recliner and requiring min assist.  Pt with generalized weakness and fatigue.  Pt had HHPT prior to admission and recommend return to this upon d/c.     Follow Up Recommendations Supervision/Assistance - 24 hour;Home health PT    Equipment Recommendations  None recommended by PT    Recommendations for Other Services       Precautions / Restrictions Precautions Precautions: Fall Precaution Comments: L foot drop (unable to wear AFO since last year due to chronic wound)      Mobility  Bed Mobility Overal bed mobility: Needs Assistance Bed Mobility: Supine to Sit     Supine to sit: Min assist;HOB elevated     General bed mobility comments: verbal cues for technique, assist for trunk upright (pt on 3L O2 on arrival and SpO2 98%, Spo2 94% on room air)  Transfers Overall transfer level: Needs assistance Equipment used: Rolling walker (2 wheeled) Transfers: Sit to/from [facility] Sit to Stand: Min assist Stand pivot transfers: Min assist       General transfer comment: verbal cues for safe technique, pt with difficulty  initially lifting feet from floor, transferred to recliner with steadying assist, once in front of recliner pt was better able to march in place however fatigued; SpO2 95% on room air after transfer (reapplied only 1L O2 and RN notified)  Ambulation/Gait                Stairs            Wheelchair Mobility    Modified Rankin (Stroke Patients Only)       Balance Overall balance assessment: History of Falls                                           Pertinent Vitals/Pain Pain Assessment: Faces Faces Pain Scale: Hurts little more Pain Location: L leg Pain Descriptors / Indicators: Sore;Grimacing;Discomfort Pain Intervention(s): Monitored during session;Repositioned    Home Living Family/patient expects to be discharged to:: Assisted living               Home Equipment: Walker - 2 wheels      Prior Function Level of Independence: Needs assistance   Gait / Transfers Assistance Needed: ambulates with RW, receiving HHPT prior to this admission           Hand Dominance        Extremity/Trunk Assessment        Lower Extremity Assessment Lower Extremity Assessment: Generalized weakness;LLE deficits/detail LLE Deficits / Details: hx of L foot drop, erythematous L lower leg esp  with dependent position    Cervical / Trunk Assessment Cervical / Trunk Assessment: Other exceptions Cervical / Trunk Exceptions: ecchymosis mid-lower right back  Communication   Communication: HOH  Cognition Arousal/Alertness: Awake/alert Behavior During Therapy: WFL for tasks assessed/performed Overall Cognitive Status: Within Functional Limits for tasks assessed                                        General Comments      Exercises     Assessment/Plan    PT Assessment Patient needs continued PT services  PT Problem List Decreased strength;Decreased mobility;Decreased activity tolerance;Decreased balance;Decreased knowledge of  use of DME       PT Treatment Interventions DME instruction;Functional mobility training;Gait training;Therapeutic activities;Stair training;Therapeutic exercise;Balance training;Patient/family education    PT Goals (Current goals can be found in the Care Plan section)  Acute Rehab PT Goals PT Goal Formulation: With patient Time For Goal Achievement: 07/02/18 Potential to Achieve Goals: Good    Frequency Min 2X/week   Barriers to discharge        Co-evaluation               AM-PAC PT "6 Clicks" Mobility  Outcome Measure Help needed turning from your back to your side while in a flat bed without using bedrails?: A Little Help needed moving from lying on your back to sitting on the side of a flat bed without using bedrails?: A Little Help needed moving to and from a bed to a chair (including a wheelchair)?: A Little Help needed standing up from a chair using your arms (e.g., wheelchair or bedside chair)?: A Little Help needed to walk in hospital room?: A Lot Help needed climbing 3-5 steps with a railing? : A Lot 6 Click Score: 16    End of Session Equipment Utilized During Treatment: Gait belt Activity Tolerance: Patient limited by fatigue Patient left: with call bell/phone within reach;with chair alarm set;in chair;with family/visitor present Nurse Communication: Mobility status PT Visit Diagnosis: Difficulty in walking, not elsewhere classified (R26.2)    Time: 7829-5621 PT Time Calculation (min) (ACUTE ONLY): 25 min   Charges:   PT Evaluation $PT Eval Low Complexity: [place], PT, DPT Acute Rehabilitation Services Office: [phone] Pager: [phone]  Trena Platt 06/18/2018, 12:55 PM

## 2018-06-19 DIAGNOSIS — Z95828 Presence of other vascular implants and grafts: Secondary | ICD-10-CM

## 2018-06-19 DIAGNOSIS — M79644 Pain in right finger(s): Secondary | ICD-10-CM

## 2018-06-19 DIAGNOSIS — M1990 Unspecified osteoarthritis, unspecified site: Secondary | ICD-10-CM

## 2018-06-19 LAB — BASIC METABOLIC PANEL
Anion gap: 8 (ref 5–15)
BUN: 14 mg/dL (ref 8–23)
CO2: 26 mmol/L (ref 22–32)
Calcium: 8.1 mg/dL — ABNORMAL LOW (ref 8.9–10.3)
Chloride: 96 mmol/L — ABNORMAL LOW (ref 98–111)
Creatinine, Ser: 0.55 mg/dL (ref 0.44–1.00)
GFR calc Af Amer: >60 mL/min (ref >60)
Glucose, Bld: 107 mg/dL — ABNORMAL HIGH (ref 70–99)
Potassium: 4.2 mmol/L (ref 3.5–5.1)
Sodium: 130 mmol/L — ABNORMAL LOW (ref 135–145)

## 2018-06-19 LAB — CBC
HCT: 28.4 % — ABNORMAL LOW (ref 36.0–46.0)
Hemoglobin: 9.3 g/dL — ABNORMAL LOW (ref 12.0–15.0)
MCH: 29.5 pg (ref 26.0–34.0)
MCHC: 32.7 g/dL (ref 30.0–36.0)
MCV: 90.2 fL (ref 80.0–100.0)
Platelets: 157 10*3/uL (ref 150–400)
RBC: 3.15 MIL/uL — ABNORMAL LOW (ref 3.87–5.11)
RDW: 13.6 % (ref 11.5–15.5)
WBC: 11.6 10*3/uL — AB (ref 4.0–10.5)
nRBC: 0 % (ref 0.0–0.2)

## 2018-06-19 LAB — CULTURE, BLOOD (ROUTINE X 2): Culture: NO GROWTH

## 2018-06-19 LAB — PROTIME-INR
INR: 2.6 — AB (ref 0.8–1.2)
PROTHROMBIN TIME: 27.2 s — AB (ref 11.4–15.2)

## 2018-06-19 MED ORDER — WARFARIN SODIUM 2.5 MG PO TABS
2.5000 mg | ORAL_TABLET | Freq: Once | ORAL | Status: AC
  Administered 2018-06-19: 2.5 mg via ORAL
  Filled 2018-06-19: qty 1

## 2018-06-19 MED ORDER — KETOROLAC TROMETHAMINE 15 MG/ML IJ SOLN
15.0000 mg | Freq: Once | INTRAMUSCULAR | Status: AC
  Administered 2018-06-19: 15 mg via INTRAVENOUS
  Filled 2018-06-19: qty 1

## 2018-06-19 NOTE — Progress Notes (Signed)
Occupational Therapy Treatment Patient Details Name: Brittany [name] MRN: [id number] DOB: 26-Mar-1928 Today's Date: 06/19/2018    History of present illness 85 y.o. female with a history of cirrhosis, chronic AFib, chronic back pain, left foot drop, HTN, gastritis/GERD, pulmonary HTN, AVR 2005 on coumadin, and bacterial endocarditis who presented from ALF with fever, weakness, and an unwitnessed fall. She was found to be febrile and admitted for Sepsis due to group G strep bacteremia, source suspected to be left foot ulcer with cellulitis   OT comments  Pt with good participation this day.  Pt pleased daughter has fixed her lunch.  Pt with increased I with sit to stand and mobility for ADL activity this day   Follow Up Recommendations  Home health OT;Supervision/Assistance - 24 hour    Equipment Recommendations  Other (comment)    Recommendations for Other Services      Precautions / Restrictions Precautions Precautions: Fall Precaution Comments: L foot drop (unable to wear AFO since last year due to chronic wound)       Mobility Bed Mobility               General bed mobility comments: pt in chair  Transfers Overall transfer level: Needs assistance Equipment used: Rolling walker (2 wheeled) Transfers: Sit to/from [facility] Sit to Stand: Min assist Stand pivot transfers: Min assist       General transfer comment: VC for hand placement.  Pt initiallly sat quickly with no warning.     Balance Overall balance assessment: History of Falls                                         ADL either performed or assessed with clinical judgement   ADL Overall ADL's : Needs assistance/impaired Eating/Feeding: Set up Eating/Feeding Details (indicate cue type and reason): pt fed herself with S this day with set up.   Grooming: Set up;Sitting;Brushing hair;Wash/dry Lobbyist Transfer: Minimal  assistance;BSC;RW;Stand-pivot;Cueing for safety   Toileting- Clothing Manipulation and Hygiene: Sit to/from stand;Cueing for compensatory techniques;Cueing for safety;Minimal assistance         General ADL Comments: OT session focused on ADL's and sit to stand using safe technique.  Daughter present for part of OT session     Vision Baseline Vision/History: Macular Degeneration            Cognition Arousal/Alertness: Awake/alert Behavior During Therapy: WFL for tasks assessed/performed Overall Cognitive Status: Within Functional Limits for tasks assessed                                                     Pertinent Vitals/ Pain       Pain Assessment: No/denies pain         Frequency  Min 2X/week        Progress Toward Goals  OT Goals(current goals can now be found in the care plan section)  Progress towards OT goals: Progressing toward goals     Plan Discharge plan remains appropriate       AM-PAC OT "6 Clicks" Daily Activity     Outcome Measure   Help from another  person eating meals?: A Little Help from another person taking care of personal grooming?: A Little Help from another person toileting, which includes using toliet, bedpan, or urinal?: A Lot Help from another person bathing (including washing, rinsing, drying)?: A Little Help from another person to put on and taking off regular upper body clothing?: A Little Help from another person to put on and taking off regular lower body clothing?: A Lot 6 Click Score: 16    End of Session Equipment Utilized During Treatment: Rolling walker;Oxygen  OT Visit Diagnosis: Unsteadiness on feet (R26.81);Repeated falls (R29.6);Muscle weakness (generalized) (M62.81);History of falling (Z91.81)   Activity Tolerance Patient limited by fatigue   Patient Left in chair;with call bell/phone within reach;with chair alarm set;with family/visitor present   Nurse Communication Mobility status         Time: 1209-1221 OT Time Calculation (min): 12 min  Charges: OT General Charges $OT Visit: 1 Visit OT Treatments $Self Care/Home Management : 8-22 mins  Kari Baars, [place] [phone] Office- [place], Edwena Felty D 06/19/2018, 1:18 PM

## 2018-06-19 NOTE — Progress Notes (Signed)
Progress Note  Patient Name: Brittany [name] Date of Encounter: 06/19/2018  Primary Cardiologist: Brittany Moores, MD   Subjective   No recurrent chest pain  Inpatient Medications    Scheduled Meds: . atorvastatin  20 mg Oral Daily  . busPIRone  15 mg Oral BID  . cycloSPORINE  1 drop Both Eyes BID  . diltiazem  120 mg Oral BID  . feeding supplement (ENSURE ENLIVE)  237 mL Oral BID BM  . feeding supplement (PRO-STAT SUGAR FREE 64)  30 mL Oral BID  . isosorbide mononitrate  15 mg Oral Daily  . multivitamin  1 tablet Oral BID  . oxybutynin  5 mg Oral Daily  . pantoprazole  20 mg Oral Daily  . polyvinyl alcohol  1 drop Both Eyes BID  . sodium chloride flush  10-40 mL Intracatheter Q12H  . warfarin  2.5 mg Oral ONCE-1800  . Warfarin - Pharmacist Dosing Inpatient   Does not apply q1800   Continuous Infusions: . cefTRIAXone (ROCEPHIN)  IV 2 g (06/19/18 0840)   PRN Meds: acetaminophen **OR** acetaminophen, alum & mag hydroxide-simeth, HYDROcodone-acetaminophen, morphine injection, nitroGLYCERIN, ondansetron **OR** ondansetron (ZOFRAN) IV, promethazine, senna-docusate, sodium chloride, sodium chloride flush, sorbitol   Vital Signs    Vitals:   06/18/18 1531 06/18/18 2054 06/19/18 0418 06/19/18 1002  BP: 117/62 126/69 (!) 130/59 (!) 157/82  Pulse:  71 72   Resp:  17 16   Temp:  98.1 F (36.7 C) 99 F (37.2 C)   TempSrc:  Oral Oral   SpO2:  96% 93%   Weight:   61.7 kg   Height:        Intake/Output Summary (Last 24 hours) at 06/19/2018 1209 Last data filed at 06/19/2018 0419 Gross per 24 hour  Intake 240 ml  Output 1700 ml  Net -1460 ml   Filed Weights   06/14/18 1609 06/15/18 1306 06/19/18 0418  Weight: 47.6 kg 62.2 kg 61.7 kg    Telemetry    afib  Personally Reviewed  ECG    No new  Physical Exam   GEN: No acute distress.   Neck: No JVD Cardiac: irregular rhythm, normal rate, 2/6 SEM ,no  rubs, or gallops.  Respiratory: Clear to auscultation  bilaterally. GI: Soft, nontender, non-distended  MS: No edema Neuro:  Nonfocal  Psych: Normal affect   Labs    Chemistry Recent Labs  Lab 06/14/18 0950  06/17/18 0752 06/18/18 0316 06/19/18 0831  NA 130*   < > 128* 132* 130*  K 4.1   < > 4.3 4.0 4.2  CL 95*   < > 96* 95* 96*  CO2 25   < > 27 30 26   GLUCOSE 91   < > 98 122* 107*  BUN 18   < > 16 20 14   CREATININE 0.78   < > 0.56 0.76 0.55  CALCIUM 8.9   < > 7.6* 7.8* 8.1*  PROT 8.2*  --  5.6*  --   --   ALBUMIN 3.8  --  2.2*  --   --   AST 31  --  28  --   --   ALT 26  --  22  --   --   ALKPHOS 90  --  96  --   --   BILITOT 1.2  --  0.3  --   --   GFRNONAA >60   < > >60 >60 >60  GFRAA >60   < > >60 >60 >60  ANIONGAP 10   < > 5 7 8    < > = values in this interval not displayed.     Hematology Recent Labs  Lab 06/17/18 0823 06/18/18 0316 06/19/18 0831  WBC 18.6* 13.8* 11.6*  RBC 3.15* 3.08* 3.15*  HGB 9.2* 9.0* 9.3*  HCT 28.9* 28.4* 28.4*  MCV 91.7 92.2 90.2  MCH 29.2 29.2 29.5  MCHC 31.8 31.7 32.7  RDW 13.6 13.6 13.6  PLT 147* 162 157    Cardiac Enzymes Recent Labs  Lab 06/17/18 0752 06/17/18 1300 06/17/18 1900  TROPONINI 0.10* 0.10* 0.09*   No results for input(s): TROPIPOC in the last 168 hours.   BNPNo results for input(s): BNP, PROBNP in the last 168 hours.   DDimer No results for input(s): DDIMER in the last 168 hours.   Radiology    No results found.  Cardiac Studies   Echocardiogram 06/16/2018: Impressions: 1. The left ventricle has hyperdynamic systolic function, with an ejection fraction of >65%. The cavity size was normal. There is moderate concentric left ventricular hypertrophy. Left ventricular diastology could not be evaluated secondary to atrial  fibrillation. 2. The right ventricle has normal systolic function. The cavity was normal. There is no increase in right ventricular wall thickness. Right ventricular systolic pressure is severely elevated with an estimated pressure of  64.4 mmHg. 3. Left atrial size was severely dilated. 4. Right atrial size was severely dilated. 5. The mitral valve is degenerative. Moderate thickening of the mitral valve leaflet. Moderate calcification of the mitral valve leaflet. There is severe mitral annular calcification present. Mitral valve regurgitation is mild to moderate by color flow  Doppler. 6. The tricuspid valve is normal in structure. Tricuspid valve regurgitation is severe. 7. The aortic valve is tricuspid Moderate thickening of the aortic valve Moderate calcification of the aortic valve. Moderate-severe stenosis of the aortic valve. No vegetaion is seen. 8. The pulmonic valve was normal in structure. 9. The inferior vena cava was dilated in size with <50% respiratory variability.   Patient Profile     [name] is a 85 y.o. female with a history of aortic valve replacement, atrial fibrillation, prior stroke with right visual field defect, hypertension, and hyperlipidemia who was admitted for fever, weakness, and a fall. She was found to be bacteremic. Cardiology was consulted for evaluation of chest pain which was nitrate responsive and an elevated troponin.   Assessment & Plan   Principal Problem:   Sepsis secondary to UTI [facility]) Active Problems:   Aortic stenosis   Hypertension   Chest pain   Chronic atrial fibrillation (HCC)   Esophageal reflux   Hyponatremia   Diarrhea   Dementia (HCC)   Chronic ulcer of left foot (HCC)   Streptococcal bacteremia   Chest pain  - improved, none overnight. Continue imdur 15 mg daily.  afib - continue diltiazem, and coumadin  Bacteremia - plan for TEE tomorrow. Npo at midnight. Consent obtained yesterday by C. Sarajane [facility]     For questions or updates, please contact [facility] Please consult [email or web address] for contact info under        Signed, Elouise Munroe, MD  06/19/2018, 12:09 PM

## 2018-06-19 NOTE — Progress Notes (Signed)
[facility] called and scheduled transportation to [facility] for TEE, plan to pick patient up at 9am [facility]. Patient informed.

## 2018-06-19 NOTE — Progress Notes (Signed)
[place] for warfarin Indication: atrial fibrillation and aortic valve replacement  Allergies  Allergen Reactions  . Amlodipine Swelling and Other (See Comments)     Unspecified swelling reaction  . Amoxicillin Diarrhea, Nausea And Vomiting and Other (See Comments)    Has patient had a PCN reaction causing immediate rash, facial/tongue/throat swelling, SOB or lightheadedness with hypotension: No Has patient had a PCN reaction causing severe rash involving mucus membranes or skin necrosis: No Has patient had a PCN reaction that required hospitalization: No Has patient had a PCN reaction occurring within the last 10 years: No If all of the above answers are "NO", then may proceed with Cephalosporin use.  . Carafate [Sucralfate] Swelling and Other (See Comments)    Reaction:  Knee swelling/redness   . Cephalexin Diarrhea  . Codeine Nausea And Vomiting  . Irbesartan Swelling and Other (See Comments)    Reaction:  Facial/hand swelling and numbness   . Morphine And Related Other (See Comments)    Pt states that she has a history of addiction with this medication.      . Neurontin [Gabapentin] Swelling and Other (See Comments)    Reaction:  Leg swelling   . Nitrofurantoin Monohyd Macro Diarrhea and Nausea And Vomiting  . Prednisone Other (See Comments)    Reaction:  Elevated BP  . Sulfa Antibiotics Rash    Patient Measurements: Height: 5\' 1"  (154.9 cm) Weight: 136 lb 0.4 oz (61.7 kg) IBW/kg (Calculated) : 47.8  Vital Signs: Temp: 99 F (37.2 C) (03/10 0418) Temp Source: Oral (03/10 0418) BP: 130/59 (03/10 0418) Pulse Rate: 72 (03/10 0418)  Labs: Recent Labs    06/17/18 0403 06/17/18 0752 06/17/18 0823 06/17/18 1300 06/17/18 1900 06/18/18 0316 06/19/18 0336  HGB  --   --  9.2*  --   --  9.0*  --   HCT  --   --  28.9*  --   --  28.4*  --   PLT  --   --  147*  --   --  162  --   LABPROT 20.2*  --   --   --   --  25.7* 27.2*  INR  1.8*  --   --   --   --  2.4* 2.6*  CREATININE  --  0.56  --   --   --  0.76  --   TROPONINI  --  0.10*  --  0.10* 0.09*  --   --     Estimated Creatinine Clearance: 40.2 mL/min (by C-G formula based on SCr of 0.76 mg/dL).   Assessment: 85 yo female presented to ER after fall with fever and tachycardia. Pt takes warfarin PTA for atrial fibrillation and history of aortic valve replacement. Pharmacy consulted to dose/monitor warfarin during admission. INR 1.9 therapeutic on admission. LD PTA 3/4  Home dose/INR goal (confirmed with outpatient clinic note from 06/08/18): Dose: Warfarin 2.5 mg on Tuesday & Sat; 5 mg on all other days of the week    Today, 06/19/18  INR 2.6 is therapeutic  3/9 Hgb 9 decreased from admission but stable from yesterday.   3/9 Plt - improved  Pt is on antibiotic (ceftriaxone) which can increase warfarin sensitivity.  Warfarin dose 3/5 not charted as being given.  Goal of Therapy:  INR 2-3   Plan:   Repeat warfarin 2.5 mg po x 1 dose at 1800  INR daily  Closely monitor for signs/symptoms of bleeding or thrombosis  Eudelia Bunch, Pharm.D [phone] 06/19/2018 7:47 AM

## 2018-06-19 NOTE — Progress Notes (Addendum)
INFECTIOUS DISEASE PROGRESS NOTE  ID: Brittany [name] is a 85 y.o. female with  Principal Problem:   Sepsis secondary to UTI [facility]) Active Problems:   Aortic stenosis   Hypertension   Chest pain   Chronic atrial fibrillation (HCC)   Esophageal reflux   Hyponatremia   Diarrhea   Dementia (HCC)   Chronic ulcer of left foot (HCC)   Streptococcal bacteremia  Subjective: Complains of R thumb pain. Hx of arthritis.   Abtx:  Anti-infectives (From admission, onward)   Start     Dose/Rate Route Frequency Ordered Stop   06/15/18 0500  cefTRIAXone (ROCEPHIN) 2 g in sodium chloride 0.9 % 100 mL IVPB     2 g 200 mL/hr over 30 Minutes Intravenous Daily 06/15/18 0131     06/14/18 2200  ceFEPIme (MAXIPIME) 1 g in sodium chloride 0.9 % 100 mL IVPB  Status:  Discontinued     1 g 200 mL/hr over 30 Minutes Intravenous Every 12 hours 06/14/18 1150 06/15/18 0130   06/14/18 1015  ceFEPIme (MAXIPIME) 2 g in sodium chloride 0.9 % 100 mL IVPB     2 g 200 mL/hr over 30 Minutes Intravenous  Once 06/14/18 1007 06/14/18 1253   06/14/18 1015  vancomycin (VANCOCIN) IVPB 1000 mg/200 mL premix     1,000 mg 200 mL/hr over 60 Minutes Intravenous  Once 06/14/18 1007 06/14/18 1352      Medications:  Scheduled: . atorvastatin  20 mg Oral Daily  . busPIRone  15 mg Oral BID  . cycloSPORINE  1 drop Both Eyes BID  . diltiazem  120 mg Oral BID  . feeding supplement (ENSURE ENLIVE)  237 mL Oral BID BM  . feeding supplement (PRO-STAT SUGAR FREE 64)  30 mL Oral BID  . isosorbide mononitrate  15 mg Oral Daily  . multivitamin  1 tablet Oral BID  . oxybutynin  5 mg Oral Daily  . pantoprazole  20 mg Oral Daily  . polyvinyl alcohol  1 drop Both Eyes BID  . sodium chloride flush  10-40 mL Intracatheter Q12H  . warfarin  2.5 mg Oral ONCE-1800  . Warfarin - Pharmacist Dosing Inpatient   Does not apply q1800    Objective: Vital signs in last 24 hours: Temp:  [98.1 F (36.7 C)-99 F (37.2 C)] 98.2 F (36.8 C)  (03/10 1250) Pulse Rate:  [71-79] 79 (03/10 1250) Resp:  [16-18] 18 (03/10 1250) BP: (111-157)/(59-82) 111/59 (03/10 1250) SpO2:  [93 %-96 %] 94 % (03/10 1250) Weight:  [61.7 kg] 61.7 kg (03/10 0418)   General appearance: alert, cooperative and no distress Resp: clear to auscultation bilaterally Cardio: regular rate and rhythm and systolic murmur: holosystolic 4/6, crescendo throughout the precordium GI: normal findings: bowel sounds normal and soft, non-tender Extremities: R thumb- non-tender, no heat, no erythema, no swelling.   L leg erythematous, warm around calf, antero-laterally.   Lab Results Recent Labs    06/18/18 0316 06/19/18 0831  WBC 13.8* 11.6*  HGB 9.0* 9.3*  HCT 28.4* 28.4*  NA 132* 130*  K 4.0 4.2  CL 95* 96*  CO2 30 26  BUN 20 14  CREATININE 0.76 0.55   Liver Panel Recent Labs    06/17/18 0752  PROT 5.6*  ALBUMIN 2.2*  AST 28  ALT 22  ALKPHOS 96  BILITOT 0.3   Sedimentation Rate No results for input(s): ESRSEDRATE in the last 72 hours. C-Reactive Protein No results for input(s): CRP in the last 72 hours.  Microbiology: Recent Results (from the past 240 hour(s))  Culture, blood (Routine x 2)     Status: Abnormal   Collection Time: 06/14/18  9:50 AM  Result Value Ref Range Status   Specimen Description   Final    BLOOD RIGHT ANTECUBITAL Performed at [facility] [address] [place], [place] [address]    Special Requests   Final    BOTTLES DRAWN AEROBIC AND ANAEROBIC Blood Culture results may not be optimal due to an excessive volume of blood received in culture bottles Performed at [place] [address] [place], [place] [address]    Culture  Setup Time   Final    GRAM POSITIVE COCCI IN BOTH AEROBIC AND ANAEROBIC BOTTLES CRITICAL RESULT CALLED TO, READ BACK BY AND VERIFIED WITH: B GREEN PHARMD 06/14/18 2321 JDW Performed at [facility], [place] [address], [place], [place] [address]     Culture STREPTOCOCCUS GROUP G (A)  Final   Report Status 06/17/2018 FINAL  Final   Organism ID, Bacteria STREPTOCOCCUS GROUP G  Final      Susceptibility   Streptococcus group g - MIC*    CLINDAMYCIN <=0.25 SENSITIVE Sensitive     AMPICILLIN <=0.25 SENSITIVE Sensitive     ERYTHROMYCIN <=0.12 SENSITIVE Sensitive     VANCOMYCIN <=0.12 SENSITIVE Sensitive     CEFTRIAXONE <=0.12 SENSITIVE Sensitive     LEVOFLOXACIN 0.5 SENSITIVE Sensitive     * STREPTOCOCCUS GROUP G  Blood Culture ID Panel (Reflexed)     Status: Abnormal   Collection Time: 06/14/18  9:50 AM  Result Value Ref Range Status   Enterococcus species NOT DETECTED NOT DETECTED Final   Listeria monocytogenes NOT DETECTED NOT DETECTED Final   Staphylococcus species NOT DETECTED NOT DETECTED Final   Staphylococcus aureus (BCID) NOT DETECTED NOT DETECTED Final   Streptococcus species DETECTED (A) NOT DETECTED Final    Comment: Not Enterococcus species, Streptococcus agalactiae, Streptococcus pyogenes, or Streptococcus pneumoniae. CRITICAL RESULT CALLED TO, READ BACK BY AND VERIFIED WITH: B GREEN PHARMD 06/14/18 2321 JDW    Streptococcus agalactiae NOT DETECTED NOT DETECTED Final   Streptococcus pneumoniae NOT DETECTED NOT DETECTED Final   Streptococcus pyogenes NOT DETECTED NOT DETECTED Final   Acinetobacter baumannii NOT DETECTED NOT DETECTED Final   Enterobacteriaceae species NOT DETECTED NOT DETECTED Final   Enterobacter cloacae complex NOT DETECTED NOT DETECTED Final   Escherichia coli NOT DETECTED NOT DETECTED Final   Klebsiella oxytoca NOT DETECTED NOT DETECTED Final   Klebsiella pneumoniae NOT DETECTED NOT DETECTED Final   Proteus species NOT DETECTED NOT DETECTED Final   Serratia marcescens NOT DETECTED NOT DETECTED Final   Haemophilus influenzae NOT DETECTED NOT DETECTED Final   Neisseria meningitidis NOT DETECTED NOT DETECTED Final   Pseudomonas aeruginosa NOT DETECTED NOT DETECTED Final   Candida albicans NOT DETECTED  NOT DETECTED Final   Candida glabrata NOT DETECTED NOT DETECTED Final   Candida krusei NOT DETECTED NOT DETECTED Final   Candida parapsilosis NOT DETECTED NOT DETECTED Final   Candida tropicalis NOT DETECTED NOT DETECTED Final    Comment: Performed at [facility], [place] [address] [place], [place] [address]  Culture, Urine     Status: Abnormal   Collection Time: 06/14/18 11:17 AM  Result Value Ref Range Status   Specimen Description   Final    URINE, RANDOM Performed at [facility] [address] [place], [place] [address]    Special Requests  Final    NONE Performed at [facility], [place] [address] [place], [place] [address]    Culture (A)  Final    <10,000 COLONIES/mL INSIGNIFICANT GROWTH Performed at [place] [address] [place], [place] [address]    Report Status 06/16/2018 FINAL  Final  Culture, blood (Routine x 2)     Status: None   Collection Time: 06/14/18 11:18 AM  Result Value Ref Range Status   Specimen Description   Final    BLOOD RIGHT HAND Performed at [facility] [address], [place], [place] [address]    Special Requests   Final    BOTTLES DRAWN AEROBIC ONLY Blood Culture results may not be optimal due to an inadequate volume of blood received in culture bottles Performed at [place] [address], [place], [place] [address]    Culture   Final    NO GROWTH 5 DAYS Performed at [facility], [place] [address] [place], [place] [address]    Report Status 06/19/2018 FINAL  Final  MRSA PCR Screening     Status: None   Collection Time: 06/14/18  5:52 PM  Result Value Ref Range Status   MRSA by PCR NEGATIVE NEGATIVE Final    Comment:        The GeneXpert MRSA Assay (FDA approved for NASAL specimens only), is one component of a comprehensive MRSA colonization surveillance program. It is not intended to diagnose MRSA infection nor to guide  or monitor treatment for MRSA infections. Performed at [facility], [place] [address], [place], [place] [address]   C difficile quick scan w PCR reflex     Status: None   Collection Time: 06/15/18  5:35 AM  Result Value Ref Range Status   C Diff antigen NEGATIVE NEGATIVE Final   C Diff toxin NEGATIVE NEGATIVE Final   C Diff interpretation No C. difficile detected.  Final    Comment: Performed at [facility], [place] [address], [place], [place] [address]  Culture, blood (routine x 2)     Status: None (Preliminary result)   Collection Time: 06/17/18  4:03 AM  Result Value Ref Range Status   Specimen Description   Final    BLOOD LEFT ANTECUBITAL Performed at [facility] [address], [place], [place] [address]    Special Requests   Final    BOTTLES DRAWN AEROBIC AND ANAEROBIC Blood Culture adequate volume Performed at [place] [address], [place], [place] [address]    Culture   Final    NO GROWTH 2 DAYS Performed at [facility] [address], [place], [place] [address]    Report Status PENDING  Incomplete  Culture, blood (routine x 2)     Status: None (Preliminary result)   Collection Time: 06/17/18  4:03 AM  Result Value Ref Range Status   Specimen Description   Final    BLOOD LEFT ARM Performed at [facility], [place] [address], [place], [place] [address]    Special Requests   Final    BOTTLES DRAWN AEROBIC ONLY Blood Culture adequate volume Performed at [facility] [address] [place], [place] [address]    Culture   Final    NO GROWTH 2 DAYS Performed at [place] [address], [place],  [address]    Report Status PENDING  Incomplete  Studies/Results: No results found.   Assessment/Plan: L leg cellulitis Group G strep bacteremia Previous AVR 2005 L foot ulcer Recurrent UTI CP, +troponin  Total days of antibiotics: 4  ceftriaxone  WBC better Repeat BCx 3-8 are ngtd.  For TEE tomorrow. Await result to determine length of therapy.  Has PIC.  No evidence of septic arthritis in her R thumb at this time.  Cont to watch her leg (? Immune complex disease) Explained to pt, family.          Bobby Rumpf MD, FACP Infectious Diseases (pager) [phone] [email or web address] 06/19/2018, 3:25 PM  LOS: 4 days

## 2018-06-19 NOTE — Progress Notes (Signed)
Patient is scheduled for a TEE on 3.11.20 @ [facility] @ 1100. Patient will need to be transported to [facility] via [facility] and arrive @ 0930. Spoke with bedside RN about setting up transport.

## 2018-06-19 NOTE — Progress Notes (Signed)
PROGRESS NOTE  Brittany [name]  WPY:[id number] DOB: [birth year] DOA: 06/14/2018 PCP: Mayra Neer, MD   Brief Narrative: Brittany [name] is an 85 y.o. female with a history of cirrhosis, chronic AFib, chronic back pain, HTN, gastritis/GERD, pulmonary HTN, AVR 2005 on coumadin, and bacterial endocarditis who presented from ALF with fever, weakness, and an unwitnessed fall. She was found to be febrile to 102.36F, tachypneic but not hypoxic. WBC 19k, INR 1.9. CXR demonstrated stable cardiomegaly and pleural effusions. Flu negative. She's had a chronic left foot ulcer under 3x weekly treatment by wound care RN and recently was noted to have malodorous discharge, now with increasing erythema. XR of the foot did not reveal osteomyelitis. Urinalysis was pending at admission, though urinary source was suspected based on history of recurrent UTIs. She denies diarrhea, though CDiff was checked and found to be negative. Vancomycin and cefepime were started after blood cultures were drawn. Cultures have grown group G strep. Antibiotics narrowed to ceftriaxone. WBC rose from 19k to 33.6k though thus far the patient appears nontoxic and had defervesced.   Assessment & Plan: Principal Problem:   Sepsis secondary to UTI [facility]) Active Problems:   Aortic stenosis   Hypertension   Chest pain   Chronic atrial fibrillation (HCC)   Esophageal reflux   Hyponatremia   Diarrhea   Dementia (HCC)   Chronic ulcer of left foot ([place])   Streptococcal bacteremia  Sepsis due to group G strep bacteremia, source suspected to be left foot ulcer with cellulitis: - Leukocytosis continues significant improvement. PICC inserted due to poor access and possible need for prolonged IV abx.  - Repeat blood cultures NG2D. - Appreciate ID consultation.  - TTE without vegetation, will need TEE to evaluate for prosthetic valve endocarditis which would necessitate lifelong abx.  - Continue ceftriaxone 2g q24h. Note allergies to  amoxicillin, keflex, sulfa, and macrobid. May still be able to transition to amp/amox per ID. - WOC consulted for local wound care. Does not appear to require surgical debridement at this time.  Left leg erythema: Uncertain etiology, though not contiguous with wound. Possibly cellulitis, also ?small vessel vasculitis. Blood counts are stable, platelets remain wnl. Pulses symmetric, no gross venous insufficiency/edema.  - Not expanding, on effective abx. Will continue to monitor.  Chronic atrial fibrillation:  - Rate is controlled, continue diltiazem - Continue coumadin as below  Chest pain in patient with hx CAD s/p RCA DES 2015: Troponin trend reassuring 0.10 > 0.10 > 0.09 with no ischemic ECG changes.  - [place] cardiology consultation. The patient is on anticoagulation and is not a good candidate for catheterization even if she were to develop NSTEMI. Started imdur for antianginal effect 3/9. BP stable. - Continue oxygen prn, prn NTG, prn morphine - Continue statin, coumadin. Has been a nonresponder to plavix and not taking aspirin due to hx hiatal hernia. No longer taking atenolol.  - Trial GI cocktail as well. On PPI  Chronic diastolic CHF: After IVF's given, having some edema and dilated IVC on echo, today developing mild LE edema. Echo April 2019 w/EF 05-39%, diastolic function not mentioned due to AFib. - Stopped IVF 3/7, gave demadex 3/8. BUN:Cr >20:1 and edema improved. No dyspnea or crackles currently.  - Continue to monitor I/O, weights, BMP  History of brioprosthetic AVR: Mild stenosis on most recent echo. - Continue coumadin with INR goal 2-3, dosing per pharmacy.   Pulmonary HTN, emphysema:  - Echo as above - Has follow up with pulmonology, Dr. Lamonte Sakai  4/14  Left foot drop: s/p orthopedic procedure 1987.  - Fall precautions  Hyponatremia:  - Monitor with diuretic  Fall at home:  - Fall precautions, PT, OT eval to start 3/9  Dyslipidemia:  - Continue  statin  GERD:  - PPI  DVT prophylaxis: Coumadin Code Status: DNR Family Communication: None at bedside this AM.   Disposition Plan: Uncertain. Anticipate return to ALF once clinically stabilizing, may need higher level of care if grows more weak during hospitalization.  Consultants:   ID  Procedures:   Echocardiogram 06/16/2018: 1. The left ventricle has hyperdynamic systolic function, with an ejection fraction of >65%. The cavity size was normal. There is moderate concentric left ventricular hypertrophy. Left ventricular diastology could not be evaluated secondary to atrial  fibrillation.  2. The right ventricle has normal systolic function. The cavity was normal. There is no increase in right ventricular wall thickness. Right ventricular systolic pressure is severely elevated with an estimated pressure of 64.4 mmHg.  3. Left atrial size was severely dilated.  4. Right atrial size was severely dilated.  5. The mitral valve is degenerative. Moderate thickening of the mitral valve leaflet. Moderate calcification of the mitral valve leaflet. There is severe mitral annular calcification present. Mitral valve regurgitation is mild to moderate by color flow  Doppler.  6. The tricuspid valve is normal in structure. Tricuspid valve regurgitation is severe.  7. The aortic valve is tricuspid Moderate thickening of the aortic valve Moderate calcification of the aortic valve. moderate-severe stenosis of the aortic valve. No vegetaion is seen.  8. The pulmonic valve was normal in structure.  9. The inferior vena cava was dilated in size with <50% respiratory variability.  Antimicrobials:  Vancomycin 3/5  Cefepime 3/5  Ceftriaxone 3/6 >>    Subjective: Chest pain better, no dyspnea. Feels tired, needs help with eating. RN was aware when I spoke with her. Left leg tenderness is decreased. No fevers, or palpitations. No new bleeding or bruising.  Objective: Vitals:   06/18/18 1513 06/18/18  1531 06/18/18 2054 06/19/18 0418  BP: (!) 143/67 117/62 126/69 (!) 130/59  Pulse:   71 72  Resp:   17 16  Temp:   98.1 F (36.7 C) 99 F (37.2 C)  TempSrc:   Oral Oral  SpO2:   96% 93%  Weight:    61.7 kg  Height:        Intake/Output Summary (Last 24 hours) at 06/19/2018 0908 Last data filed at 06/19/2018 0419 Gross per 24 hour  Intake 480 ml  Output 1700 ml  Net -1220 ml   Filed Weights   06/14/18 1609 06/15/18 1306 06/19/18 0418  Weight: 47.6 kg 62.2 kg 61.7 kg   Gen: Tired-appearing elderly female in no distress Pulm: Nonlabored breathing room air. Clear. CV: Irreg irreg, no murmur, rub, or gallop. No JVD, trace LLE dependent edema which has improved. GI: Abdomen soft, non-tender, non-distended, with normoactive bowel sounds.  Ext: Warm, no deformities Skin: Left leg with stable erythema, decreased tenderness, not expanding from demarcation. See picture below. Findings are not palpable on exam and warmth previously present is decreased. Toes with brisk cap refill, warm and dry. Neuro: Alert and oriented. No focal neurological deficits. Psych: Judgement and insight appear fair. Mood euthymic & affect congruent. Behavior is appropriate.       Data Reviewed: I have personally reviewed following labs and imaging studies  CBC: Recent Labs  Lab 06/14/18 0950 06/15/18 7829 06/16/18 0347 06/17/18 5621 06/18/18 0316  06/19/18 0831  WBC 19.0* 33.6* 29.2* 18.6* 13.8* 11.6*  NEUTROABS 17.6*  --   --   --  10.2*  --   HGB 13.3 10.2* 10.4* 9.2* 9.0* 9.3*  HCT 40.4 31.8* 33.2* 28.9* 28.4* 28.4*  MCV 91.4 93.3 94.9 91.7 92.2 90.2  PLT 256 166 154 147* 162 160   Basic Metabolic Panel: Recent Labs  Lab 06/15/18 0453 06/16/18 0347 06/17/18 0752 06/18/18 0316 06/19/18 0831  NA 128* 129* 128* 132* 130*  K 4.4 4.3 4.3 4.0 4.2  CL 100 99 96* 95* 96*  CO2 20* 21* 27 30 26   GLUCOSE 94 101* 98 122* 107*  BUN 18 16 16 20 14   CREATININE 0.89 0.67 0.56 0.76 0.55  CALCIUM 7.3*  7.9* 7.6* 7.8* 8.1*   GFR: Estimated Creatinine Clearance: 40.2 mL/min (by C-G formula based on SCr of 0.55 mg/dL). Liver Function Tests: Recent Labs  Lab 06/14/18 0950 06/17/18 0752  AST 31 28  ALT 26 22  ALKPHOS 90 96  BILITOT 1.2 0.3  PROT 8.2* 5.6*  ALBUMIN 3.8 2.2*   No results for input(s): LIPASE, AMYLASE in the last 168 hours. No results for input(s): AMMONIA in the last 168 hours. Coagulation Profile: Recent Labs  Lab 06/15/18 0453 06/16/18 0347 06/17/18 0403 06/18/18 0316 06/19/18 0336  INR 2.1* 1.4* 1.8* 2.4* 2.6*   Cardiac Enzymes: Recent Labs  Lab 06/17/18 0752 06/17/18 1300 06/17/18 1900  TROPONINI 0.10* 0.10* 0.09*   Urine analysis:    Component Value Date/Time   COLORURINE YELLOW 06/14/2018 1117   APPEARANCEUR CLEAR 06/14/2018 1117   LABSPEC 1.010 06/14/2018 1117   PHURINE 7.0 06/14/2018 1117   GLUCOSEU NEGATIVE 06/14/2018 1117   GLUCOSEU NEGATIVE 06/25/2014 1201   HGBUR MODERATE (A) 06/14/2018 1117   BILIRUBINUR NEGATIVE 06/14/2018 1117   KETONESUR NEGATIVE 06/14/2018 1117   PROTEINUR NEGATIVE 06/14/2018 1117   UROBILINOGEN 0.2 08/27/2014 1954   NITRITE NEGATIVE 06/14/2018 1117   LEUKOCYTESUR NEGATIVE 06/14/2018 1117   Recent Results (from the past 240 hour(s))  Culture, blood (Routine x 2)     Status: Abnormal   Collection Time: 06/14/18  9:50 AM  Result Value Ref Range Status   Specimen Description   Final    BLOOD RIGHT ANTECUBITAL Performed at [facility], [place] [address] [place], [place] [address]    Special Requests   Final    BOTTLES DRAWN AEROBIC AND ANAEROBIC Blood Culture results may not be optimal due to an excessive volume of blood received in culture bottles Performed at [facility] [address] [place], [place] [address]    Culture  Setup Time   Final    GRAM POSITIVE COCCI IN BOTH AEROBIC AND ANAEROBIC BOTTLES CRITICAL RESULT CALLED TO, READ BACK BY AND VERIFIED WITH: B  GREEN PHARMD 06/14/18 2321 JDW Performed at [facility], [place] [address], [place], [place] [address]    Culture STREPTOCOCCUS GROUP G (A)  Final   Report Status 06/17/2018 FINAL  Final   Organism ID, Bacteria STREPTOCOCCUS GROUP G  Final      Susceptibility   Streptococcus group g - MIC*    CLINDAMYCIN <=0.25 SENSITIVE Sensitive     AMPICILLIN <=0.25 SENSITIVE Sensitive     ERYTHROMYCIN <=0.12 SENSITIVE Sensitive     VANCOMYCIN <=0.12 SENSITIVE Sensitive     CEFTRIAXONE <=0.12 SENSITIVE Sensitive     LEVOFLOXACIN 0.5 SENSITIVE Sensitive     * STREPTOCOCCUS GROUP G  Blood Culture ID  Panel (Reflexed)     Status: Abnormal   Collection Time: 06/14/18  9:50 AM  Result Value Ref Range Status   Enterococcus species NOT DETECTED NOT DETECTED Final   Listeria monocytogenes NOT DETECTED NOT DETECTED Final   Staphylococcus species NOT DETECTED NOT DETECTED Final   Staphylococcus aureus (BCID) NOT DETECTED NOT DETECTED Final   Streptococcus species DETECTED (A) NOT DETECTED Final    Comment: Not Enterococcus species, Streptococcus agalactiae, Streptococcus pyogenes, or Streptococcus pneumoniae. CRITICAL RESULT CALLED TO, READ BACK BY AND VERIFIED WITH: B GREEN PHARMD 06/14/18 2321 JDW    Streptococcus agalactiae NOT DETECTED NOT DETECTED Final   Streptococcus pneumoniae NOT DETECTED NOT DETECTED Final   Streptococcus pyogenes NOT DETECTED NOT DETECTED Final   Acinetobacter baumannii NOT DETECTED NOT DETECTED Final   Enterobacteriaceae species NOT DETECTED NOT DETECTED Final   Enterobacter cloacae complex NOT DETECTED NOT DETECTED Final   Escherichia coli NOT DETECTED NOT DETECTED Final   Klebsiella oxytoca NOT DETECTED NOT DETECTED Final   Klebsiella pneumoniae NOT DETECTED NOT DETECTED Final   Proteus species NOT DETECTED NOT DETECTED Final   Serratia marcescens NOT DETECTED NOT DETECTED Final   Haemophilus influenzae NOT DETECTED NOT DETECTED Final   Neisseria meningitidis NOT DETECTED  NOT DETECTED Final   Pseudomonas aeruginosa NOT DETECTED NOT DETECTED Final   Candida albicans NOT DETECTED NOT DETECTED Final   Candida glabrata NOT DETECTED NOT DETECTED Final   Candida krusei NOT DETECTED NOT DETECTED Final   Candida parapsilosis NOT DETECTED NOT DETECTED Final   Candida tropicalis NOT DETECTED NOT DETECTED Final    Comment: Performed at [facility], [place] [address] [place], [place] [address]  Culture, Urine     Status: Abnormal   Collection Time: 06/14/18 11:17 AM  Result Value Ref Range Status   Specimen Description   Final    URINE, RANDOM Performed at [facility] [address], [place], [place] [address]    Special Requests   Final    NONE Performed at [facility], [place] [address], [place], [place] [address]    Culture (A)  Final    <10,000 COLONIES/mL INSIGNIFICANT GROWTH Performed at [facility] [address] [place], [place] [address]    Report Status 06/16/2018 FINAL  Final  Culture, blood (Routine x 2)     Status: None   Collection Time: 06/14/18 11:18 AM  Result Value Ref Range Status   Specimen Description   Final    BLOOD RIGHT HAND Performed at [facility] [address], [place], [place] [address]    Special Requests   Final    BOTTLES DRAWN AEROBIC ONLY Blood Culture results may not be optimal due to an inadequate volume of blood received in culture bottles Performed at [facility] [address] [place], [place] [address]    Culture   Final    NO GROWTH 5 DAYS Performed at [facility], [place] [address], [place], [place] [address]    Report Status 06/19/2018 FINAL  Final  MRSA PCR Screening     Status: None   Collection Time: 06/14/18  5:52 PM  Result Value Ref Range Status   MRSA by PCR NEGATIVE NEGATIVE Final    Comment:        The GeneXpert MRSA Assay (FDA approved for NASAL specimens only), is one component of a comprehensive  MRSA colonization surveillance program. It is not intended to diagnose  MRSA infection nor to guide or monitor treatment for MRSA infections. Performed at [facility], [place] [address], [place], [place] [address]   C difficile quick scan w PCR reflex     Status: None   Collection Time: 06/15/18  5:35 AM  Result Value Ref Range Status   C Diff antigen NEGATIVE NEGATIVE Final   C Diff toxin NEGATIVE NEGATIVE Final   C Diff interpretation No C. difficile detected.  Final    Comment: Performed at [facility], [place] [address] [place], [place] [address]  Culture, blood (routine x 2)     Status: None (Preliminary result)   Collection Time: 06/17/18  4:03 AM  Result Value Ref Range Status   Specimen Description   Final    BLOOD LEFT ANTECUBITAL Performed at [facility] [address], [place], [place] [address]    Special Requests   Final    BOTTLES DRAWN AEROBIC AND ANAEROBIC Blood Culture adequate volume Performed at [facility] [address] [place], [place] [address]    Culture   Final    NO GROWTH 2 DAYS Performed at [facility] [address], [place], [place] [address]    Report Status PENDING  Incomplete  Culture, blood (routine x 2)     Status: None (Preliminary result)   Collection Time: 06/17/18  4:03 AM  Result Value Ref Range Status   Specimen Description   Final    BLOOD LEFT ARM Performed at [facility], [place] [address] [place], [place] [address]    Special Requests   Final    BOTTLES DRAWN AEROBIC ONLY Blood Culture adequate volume Performed at [facility] [address], [place], [place] [address]    Culture   Final    NO GROWTH 2 DAYS Performed at [facility] [address] [place],  [address]    Report Status PENDING  Incomplete      Radiology Studies: No results found.  Scheduled Meds: . atorvastatin  20 mg Oral Daily  .  busPIRone  15 mg Oral BID  . cycloSPORINE  1 drop Both Eyes BID  . diltiazem  120 mg Oral BID  . feeding supplement (ENSURE ENLIVE)  237 mL Oral BID BM  . feeding supplement (PRO-STAT SUGAR FREE 64)  30 mL Oral BID  . isosorbide mononitrate  15 mg Oral Daily  . multivitamin  1 tablet Oral BID  . oxybutynin  5 mg Oral Daily  . pantoprazole  20 mg Oral Daily  . polyvinyl alcohol  1 drop Both Eyes BID  . sodium chloride flush  10-40 mL Intracatheter Q12H  . warfarin  2.5 mg Oral ONCE-1800  . Warfarin - Pharmacist Dosing Inpatient   Does not apply q1800   Continuous Infusions: . cefTRIAXone (ROCEPHIN)  IV 2 g (06/19/18 0840)     LOS: 4 days   Time spent: 35 minutes.  Patrecia Pour, MD [facility] [email or web address] Password [email or web address] 06/19/2018, 9:08 AM

## 2018-06-20 ENCOUNTER — Other Ambulatory Visit: Payer: Self-pay | Admitting: Cardiovascular Disease

## 2018-06-20 ENCOUNTER — Inpatient Hospital Stay (HOSPITAL_COMMUNITY): Payer: Medicare Other

## 2018-06-20 ENCOUNTER — Encounter (HOSPITAL_COMMUNITY)
Admission: EM | Disposition: A | Discharge status: Home Health | Payer: Self-pay | Source: Home / Self Care | Attending: Family Medicine

## 2018-06-20 ENCOUNTER — Encounter (HOSPITAL_COMMUNITY): Payer: Self-pay | Admitting: Cardiology

## 2018-06-20 DIAGNOSIS — I340 Nonrheumatic mitral (valve) insufficiency: Secondary | ICD-10-CM

## 2018-06-20 HISTORY — PX: TEE WITHOUT CARDIOVERSION: SHX5443

## 2018-06-20 LAB — PROTIME-INR
INR: 2.3 — ABNORMAL HIGH (ref 0.8–1.2)
Prothrombin Time: 25.3 seconds — ABNORMAL HIGH (ref 11.4–15.2)

## 2018-06-20 LAB — BASIC METABOLIC PANEL
Anion gap: 9 (ref 5–15)
BUN: 22 mg/dL (ref 8–23)
CHLORIDE: 92 mmol/L — AB (ref 98–111)
CO2: 28 mmol/L (ref 22–32)
Calcium: 7.9 mg/dL — ABNORMAL LOW (ref 8.9–10.3)
Creatinine, Ser: 0.72 mg/dL (ref 0.44–1.00)
GFR calc Af Amer: >60 mL/min (ref >60)
GFR calc non Af Amer: >60 mL/min (ref >60)
Glucose, Bld: 131 mg/dL — ABNORMAL HIGH (ref 70–99)
Potassium: 4 mmol/L (ref 3.5–5.1)
Sodium: 129 mmol/L — ABNORMAL LOW (ref 135–145)

## 2018-06-20 LAB — CBC
HEMATOCRIT: 26.1 % — AB (ref 36.0–46.0)
Hemoglobin: 8.4 g/dL — ABNORMAL LOW (ref 12.0–15.0)
MCH: 29.1 pg (ref 26.0–34.0)
MCHC: 32.2 g/dL (ref 30.0–36.0)
MCV: 90.3 fL (ref 80.0–100.0)
Platelets: 156 10*3/uL (ref 150–400)
RBC: 2.89 MIL/uL — ABNORMAL LOW (ref 3.87–5.11)
RDW: 13.5 % (ref 11.5–15.5)
WBC: 10.5 10*3/uL (ref 4.0–10.5)
nRBC: 0 % (ref 0.0–0.2)

## 2018-06-20 SURGERY — ECHOCARDIOGRAM, TRANSESOPHAGEAL
Anesthesia: Moderate Sedation

## 2018-06-20 MED ORDER — FENTANYL CITRATE (PF) 100 MCG/2ML IJ SOLN
INTRAMUSCULAR | Status: DC | PRN
  Administered 2018-06-20: 25 ug via INTRAVENOUS

## 2018-06-20 MED ORDER — BUTAMBEN-TETRACAINE-BENZOCAINE 2-2-14 % EX AERO
INHALATION_SPRAY | CUTANEOUS | Status: DC | PRN
  Administered 2018-06-20: 2 via TOPICAL

## 2018-06-20 MED ORDER — FENTANYL CITRATE (PF) 100 MCG/2ML IJ SOLN
INTRAMUSCULAR | Status: AC
  Filled 2018-06-20: qty 2

## 2018-06-20 MED ORDER — WARFARIN SODIUM 5 MG PO TABS
5.0000 mg | ORAL_TABLET | Freq: Once | ORAL | Status: AC
  Administered 2018-06-20: 5 mg via ORAL
  Filled 2018-06-20: qty 1

## 2018-06-20 MED ORDER — MIDAZOLAM HCL (PF) 10 MG/2ML IJ SOLN
INTRAMUSCULAR | Status: DC | PRN
  Administered 2018-06-20: 1 mg via INTRAVENOUS

## 2018-06-20 MED ORDER — SODIUM CHLORIDE 0.9 % IV SOLN
INTRAVENOUS | Status: DC
  Administered 2018-06-20: 09:00:00 via INTRAVENOUS

## 2018-06-20 MED ORDER — CEFTRIAXONE IV (FOR PTA / DISCHARGE USE ONLY): 2.0000 g | INTRAVENOUS | 0 refills | Status: DC

## 2018-06-20 MED ORDER — MIDAZOLAM HCL (PF) 5 MG/ML IJ SOLN
INTRAMUSCULAR | Status: AC
  Filled 2018-06-20: qty 2

## 2018-06-20 NOTE — Progress Notes (Signed)
Patient back from cone  post TEE. Alert and in no distress. Patient up in the chair resting.

## 2018-06-20 NOTE — Progress Notes (Signed)
Physical Therapy Treatment Patient Details Name: Brittany [name] MRN: [id number] DOB: [birth year] Today's Date: 06/20/2018    History of Present Illness 85 y.o. female with a history of cirrhosis, chronic AFib, chronic back pain, left foot drop, HTN, gastritis/GERD, pulmonary HTN, AVR 2005 on coumadin, and bacterial endocarditis who presented from ALF with fever, weakness, and an unwitnessed fall. She was found to be febrile and admitted for Sepsis due to group G strep bacteremia, source suspected to be left foot ulcer with cellulitis    PT Comments    Patient progressing with ambulation this session and noting improvement in pain in L leg, but still with history of chronic back pain.  Remains appropriate for HHPT at ALF.    Follow Up Recommendations  Supervision/Assistance - 24 hour;Home health PT     Equipment Recommendations  None recommended by PT    Recommendations for Other Services       Precautions / Restrictions Precautions Precautions: Fall Precaution Comments: L foot drop (unable to wear AFO since last year due to chronic wound) Restrictions Weight Bearing Restrictions: No    Mobility  Bed Mobility               General bed mobility comments: pt in chair  Transfers Overall transfer level: Needs assistance Equipment used: Rolling walker (2 wheeled) Transfers: Sit to/from Stand Sit to Stand: Min guard         General transfer comment: used armrests appropriately, assist for safety/balance  Ambulation/Gait Ambulation/Gait assistance: Min guard;Min assist Gait Distance (Feet): 70 Feet Assistive device: Rolling walker (2 wheeled) Gait Pattern/deviations: Decreased stride length;Decreased dorsiflexion - left;Steppage     General Gait Details: able to clear L foot despite foot drop, fatigued with ambulation, but walking in room to bathroom with nursing staff   Stairs             Wheelchair Mobility    Modified Rankin (Stroke Patients Only)        Balance Overall balance assessment: History of Falls;Mild deficits observed, not formally tested                                          Cognition Arousal/Alertness: Awake/alert Behavior During Therapy: WFL for tasks assessed/performed Overall Cognitive Status: Within Functional Limits for tasks assessed                                        Exercises General Exercises - Lower Extremity Ankle Circles/Pumps: AROM;Right;10 reps;Seated Long Arc Quad: AROM;Both;10 reps;Seated Hip Flexion/Marching: AROM;Both;10 reps;Seated Other Exercises Other Exercises: PROM L ankle DF stretch, then showed pt how to use sheet for self stretch and she performed x 3    General Comments        Pertinent Vitals/Pain Pain Score: 10-Worst pain ever Pain Location: back Pain Descriptors / Indicators: Throbbing Pain Intervention(s): Monitored during session;Patient requesting pain meds-RN notified    Home Living                      Prior Function            PT Goals (current goals can now be found in the care plan section) Progress towards PT goals: Progressing toward goals    Frequency    Min 2X/week  PT Plan Current plan remains appropriate    Co-evaluation              AM-PAC PT "6 Clicks" Mobility   Outcome Measure  Help needed turning from your back to your side while in a flat bed without using bedrails?: A Little Help needed moving from lying on your back to sitting on the side of a flat bed without using bedrails?: A Little Help needed moving to and from a bed to a chair (including a wheelchair)?: A Little Help needed standing up from a chair using your arms (e.g., wheelchair or bedside chair)?: A Little Help needed to walk in hospital room?: A Little Help needed climbing 3-5 steps with a railing? : A Lot 6 Click Score: 17    End of Session Equipment Utilized During Treatment: Gait belt Activity Tolerance:  Patient tolerated treatment well Patient left: in chair;with call bell/phone within reach   PT Visit Diagnosis: Difficulty in walking, not elsewhere classified (R26.2)     Time: 5809-9833 PT Time Calculation (min) (ACUTE ONLY): 25 min  Charges:  $Gait Training: 8-22 mins $Therapeutic Exercise: 8-22 mins                     Magda Kiel, [place] [phone] 06/20/2018    Reginia Naas 06/20/2018, 5:09 PM

## 2018-06-20 NOTE — Progress Notes (Signed)
No vegetation seen on TEE.   We will arrange cardiology follow up for patient in 3-4 weeks with Dr. Elmarie Shiley care team for follow up of regurgitant valve lesions.   Cardiology will sign off.

## 2018-06-20 NOTE — Progress Notes (Addendum)
INFECTIOUS DISEASE PROGRESS NOTE  ID: Brittany [name] is a 85 y.o. female with  Principal Problem:   Sepsis secondary to UTI [facility]) Active Problems:   Aortic stenosis   Hypertension   Chest pain   Chronic atrial fibrillation (HCC)   Esophageal reflux   Hyponatremia   Diarrhea   Dementia (HCC)   Chronic ulcer of left foot (HCC)   Streptococcal bacteremia  Subjective: Feels better, no complaints. R thumb pain resolved.   Abtx:  Anti-infectives (From admission, onward)   Start     Dose/Rate Route Frequency Ordered Stop   06/15/18 0500  cefTRIAXone (ROCEPHIN) 2 g in sodium chloride 0.9 % 100 mL IVPB     2 g 200 mL/hr over 30 Minutes Intravenous Daily 06/15/18 0131     06/14/18 2200  ceFEPIme (MAXIPIME) 1 g in sodium chloride 0.9 % 100 mL IVPB  Status:  Discontinued     1 g 200 mL/hr over 30 Minutes Intravenous Every 12 hours 06/14/18 1150 06/15/18 0130   06/14/18 1015  ceFEPIme (MAXIPIME) 2 g in sodium chloride 0.9 % 100 mL IVPB     2 g 200 mL/hr over 30 Minutes Intravenous  Once 06/14/18 1007 06/14/18 1253   06/14/18 1015  vancomycin (VANCOCIN) IVPB 1000 mg/200 mL premix     1,000 mg 200 mL/hr over 60 Minutes Intravenous  Once 06/14/18 1007 06/14/18 1352      Medications:  Scheduled:  atorvastatin  20 mg Oral Daily   busPIRone  15 mg Oral BID   cycloSPORINE  1 drop Both Eyes BID   diltiazem  120 mg Oral BID   feeding supplement (ENSURE ENLIVE)  237 mL Oral BID BM   feeding supplement (PRO-STAT SUGAR FREE 64)  30 mL Oral BID   isosorbide mononitrate  15 mg Oral Daily   multivitamin  1 tablet Oral BID   oxybutynin  5 mg Oral Daily   pantoprazole  20 mg Oral Daily   polyvinyl alcohol  1 drop Both Eyes BID   sodium chloride flush  10-40 mL Intracatheter Q12H   warfarin  5 mg Oral ONCE-1800   Warfarin - Pharmacist Dosing Inpatient   Does not apply q1800    Objective: Vital signs in last 24 hours: Temp:  [97.9 F (36.6 C)-98.6 F (37 C)] 98.6 F (37  C) (03/11 1220) Pulse Rate:  [69-103] 103 (03/11 1220) Resp:  [10-24] 18 (03/11 1220) BP: (104-208)/(61-102) 178/91 (03/11 1220) SpO2:  [93 %-99 %] 98 % (03/11 1220) Weight:  [62.3 kg] 62.3 kg (03/11 0407)   General appearance: alert, cooperative and no distress Resp: clear to auscultation bilaterally Cardio: irregularly irregular rhythm GI: normal findings: bowel sounds normal and soft, non-tender Extremities: R thumb non-tender, no swelling.   Lab Results Recent Labs    06/19/18 0831 06/20/18 0403  WBC 11.6* 10.5  HGB 9.3* 8.4*  HCT 28.4* 26.1*  NA 130* 129*  K 4.2 4.0  CL 96* 92*  CO2 26 28  BUN 14 22  CREATININE 0.55 0.72   Liver Panel No results for input(s): PROT, ALBUMIN, AST, ALT, ALKPHOS, BILITOT, BILIDIR, IBILI in the last 72 hours. Sedimentation Rate No results for input(s): ESRSEDRATE in the last 72 hours. C-Reactive Protein No results for input(s): CRP in the last 72 hours.  Microbiology: Recent Results (from the past 240 hour(s))  Culture, blood (Routine x 2)     Status: Abnormal   Collection Time: 06/14/18  9:50 AM  Result Value Ref  Range Status   Specimen Description   Final    BLOOD RIGHT ANTECUBITAL Performed at [facility] [address] [place], [place] [address]    Special Requests   Final    BOTTLES DRAWN AEROBIC AND ANAEROBIC Blood Culture results may not be optimal due to an excessive volume of blood received in culture bottles Performed at [facility] [address], [place], [place] [address]    Culture  Setup Time   Final    GRAM POSITIVE COCCI IN BOTH AEROBIC AND ANAEROBIC BOTTLES CRITICAL RESULT CALLED TO, READ BACK BY AND VERIFIED WITH: B GREEN PHARMD 06/14/18 2321 JDW Performed at [facility], [place] [address] [place], [place] [address]    Culture STREPTOCOCCUS GROUP G (A)  Final   Report Status 06/17/2018 FINAL  Final   Organism ID, Bacteria STREPTOCOCCUS GROUP G  Final       Susceptibility   Streptococcus group g - MIC*    CLINDAMYCIN <=0.25 SENSITIVE Sensitive     AMPICILLIN <=0.25 SENSITIVE Sensitive     ERYTHROMYCIN <=0.12 SENSITIVE Sensitive     VANCOMYCIN <=0.12 SENSITIVE Sensitive     CEFTRIAXONE <=0.12 SENSITIVE Sensitive     LEVOFLOXACIN 0.5 SENSITIVE Sensitive     * STREPTOCOCCUS GROUP G  Blood Culture ID Panel (Reflexed)     Status: Abnormal   Collection Time: 06/14/18  9:50 AM  Result Value Ref Range Status   Enterococcus species NOT DETECTED NOT DETECTED Final   Listeria monocytogenes NOT DETECTED NOT DETECTED Final   Staphylococcus species NOT DETECTED NOT DETECTED Final   Staphylococcus aureus (BCID) NOT DETECTED NOT DETECTED Final   Streptococcus species DETECTED (A) NOT DETECTED Final    Comment: Not Enterococcus species, Streptococcus agalactiae, Streptococcus pyogenes, or Streptococcus pneumoniae. CRITICAL RESULT CALLED TO, READ BACK BY AND VERIFIED WITH: B GREEN PHARMD 06/14/18 2321 JDW    Streptococcus agalactiae NOT DETECTED NOT DETECTED Final   Streptococcus pneumoniae NOT DETECTED NOT DETECTED Final   Streptococcus pyogenes NOT DETECTED NOT DETECTED Final   Acinetobacter baumannii NOT DETECTED NOT DETECTED Final   Enterobacteriaceae species NOT DETECTED NOT DETECTED Final   Enterobacter cloacae complex NOT DETECTED NOT DETECTED Final   Escherichia coli NOT DETECTED NOT DETECTED Final   Klebsiella oxytoca NOT DETECTED NOT DETECTED Final   Klebsiella pneumoniae NOT DETECTED NOT DETECTED Final   Proteus species NOT DETECTED NOT DETECTED Final   Serratia marcescens NOT DETECTED NOT DETECTED Final   Haemophilus influenzae NOT DETECTED NOT DETECTED Final   Neisseria meningitidis NOT DETECTED NOT DETECTED Final   Pseudomonas aeruginosa NOT DETECTED NOT DETECTED Final   Candida albicans NOT DETECTED NOT DETECTED Final   Candida glabrata NOT DETECTED NOT DETECTED Final   Candida krusei NOT DETECTED NOT DETECTED Final   Candida  parapsilosis NOT DETECTED NOT DETECTED Final   Candida tropicalis NOT DETECTED NOT DETECTED Final    Comment: Performed at [facility], [place] [address], [place], [place] [address]  Culture, Urine     Status: Abnormal   Collection Time: 06/14/18 11:17 AM  Result Value Ref Range Status   Specimen Description   Final    URINE, RANDOM Performed at [facility] [address] [place], [place] [address]    Special Requests   Final    NONE Performed at [facility], [place] [address] [place], [place] [address]    Culture (A)  Final    <10,000 COLONIES/mL  INSIGNIFICANT GROWTH Performed at [facility], [place] [address] [place], [place] [address]    Report Status 06/16/2018 FINAL  Final  Culture, blood (Routine x 2)     Status: None   Collection Time: 06/14/18 11:18 AM  Result Value Ref Range Status   Specimen Description   Final    BLOOD RIGHT HAND Performed at [facility] [address] [place], [place] [address]    Special Requests   Final    BOTTLES DRAWN AEROBIC ONLY Blood Culture results may not be optimal due to an inadequate volume of blood received in culture bottles Performed at [facility] [address], [place], [place] [address]    Culture   Final    NO GROWTH 5 DAYS Performed at [facility], [place] [address] [place], [place] [address]    Report Status 06/19/2018 FINAL  Final  MRSA PCR Screening     Status: None   Collection Time: 06/14/18  5:52 PM  Result Value Ref Range Status   MRSA by PCR NEGATIVE NEGATIVE Final    Comment:        The GeneXpert MRSA Assay (FDA approved for NASAL specimens only), is one component of a comprehensive MRSA colonization surveillance program. It is not intended to diagnose MRSA infection nor to guide or monitor treatment for MRSA infections. Performed at [facility], [place] [address] [place], [place] [address]   C  difficile quick scan w PCR reflex     Status: None   Collection Time: 06/15/18  5:35 AM  Result Value Ref Range Status   C Diff antigen NEGATIVE NEGATIVE Final   C Diff toxin NEGATIVE NEGATIVE Final   C Diff interpretation No C. difficile detected.  Final    Comment: Performed at [facility], [place] [address] [place], [place] [address]  Culture, blood (routine x 2)     Status: None (Preliminary result)   Collection Time: 06/17/18  4:03 AM  Result Value Ref Range Status   Specimen Description   Final    BLOOD LEFT ANTECUBITAL Performed at [facility] [address] [place], [place] [address]    Special Requests   Final    BOTTLES DRAWN AEROBIC AND ANAEROBIC Blood Culture adequate volume Performed at [facility] [address] [place], [place] [address]    Culture   Final    NO GROWTH 3 DAYS Performed at [facility], [place] [address] [place], [place] [address]    Report Status PENDING  Incomplete  Culture, blood (routine x 2)     Status: None (Preliminary result)   Collection Time: 06/17/18  4:03 AM  Result Value Ref Range Status   Specimen Description   Final    BLOOD LEFT ARM Performed at [facility], [place] [address] [place], [place] [address]    Special Requests   Final    BOTTLES DRAWN AEROBIC ONLY Blood Culture adequate volume Performed at [facility] [address] [place], [place] [address]    Culture   Final    NO GROWTH 3 DAYS Performed at [facility], [place] [address] [place],  [address]    Report Status PENDING  Incomplete    Studies/Results: No results found.   Assessment/Plan: L leg cellulitis Group G strep bacteremia Previous [id number] L foot ulcer Recurrent UTI CP, +troponin  Total days of antibiotics:5 ceftriaxone  TEE (-) Would continue ceftriaxone for 14 days Would repeat BCx 1 week after completing IV anbx.  Repeat BCx 3-8  ngtd Defer LLE rash, vasculitis? To primary Can f/u with PCP   Allergies  Allergen Reactions   Amlodipine Swelling and Other (See Comments)     Unspecified swelling reaction   Amoxicillin Diarrhea, Nausea And Vomiting and Other (See Comments)    Has patient had a PCN reaction causing immediate rash, facial/tongue/throat swelling, SOB or lightheadedness with hypotension: No Has patient had a PCN reaction causing severe rash involving mucus membranes or skin necrosis: No Has patient had a PCN reaction that required hospitalization: No Has patient had a PCN reaction occurring within the last 10 years: No If all of the above answers are "NO", then may proceed with Cephalosporin use.   Carafate [Sucralfate] Swelling and Other (See Comments)    Reaction:  Knee swelling/redness    Cephalexin Diarrhea   Codeine Nausea And Vomiting   Irbesartan Swelling and Other (See Comments)    Reaction:  Facial/hand swelling and numbness    Morphine And Related Other (See Comments)    Pt states that she has a history of addiction with this medication.       Neurontin [Gabapentin] Swelling and Other (See Comments)    Reaction:  Leg swelling    Nitrofurantoin Monohyd Macro Diarrhea and Nausea And Vomiting   Prednisone Other (See Comments)    Reaction:  Elevated BP   Sulfa Antibiotics Rash    OPAT Orders Discharge antibiotics: Ceftriaxone 2g qday Duration: 14 days End Date: 06-29-18  [facility] Care Per Protocol:  Labs weekly while on IV antibiotics: _x_ CBC with differential __ BMP _x_ CMP __ CRP __ ESR __ Vancomycin trough __ CK  _x_ Please pull PIC at completion of IV antibiotics __ Please leave PIC in place until doctor has seen patient or been notified  Fax weekly labs to [phone]  Clinic Follow Up Appt: PCP         Bobby Rumpf MD, FACP Infectious Diseases (pager) [phone] [email or web address] 06/20/2018, 1:07 PM  LOS: 5 days

## 2018-06-20 NOTE — Progress Notes (Addendum)
PROGRESS NOTE  Brittany [name] QHU:[id number] DOB: 1928/03/24 DOA: 06/14/2018 PCP: Mayra Neer, MD   LOS: 5 days   Brief narrative: Brittany [name] is an 85 y.o. female with last medical history of cirrhosis, chronic AFib, chronic back pain, HTN, gastritis/GERD, pulmonary HTN, AVR 2005 on coumadin, and bacterial endocarditis who presented from ALF with fever, weakness, and an unwitnessed fall. She was found to be febrile to 102.41F, tachypneic but not hypoxic. WBC 19k, INR 1.9 on presentation.. CXR demonstrated stable cardiomegaly and pleural effusions. Flu was negative. She's had a chronic left foot ulcer under 3x weekly treatment by wound care RN and recently was noted to have malodorous discharge, now with increasing erythema.  X-ray of the foot did not reveal osteomyelitis.  Vancomycin and cefepime were started after blood cultures were drawn.  Blood cultures  Grew group G strep.  Infectious disease was consulted.  Antibiotics narrowed down to ceftriaxone.  Patient plan for TEE on 06/20/2018.  Subjective: Patient complains of mild leg pain.  Denies any shortness of breath, cough fever or chills.  Awaiting for TEE.  Assessment/Plan:  Principal Problem:   Sepsis secondary to UTI [facility]) Active Problems:   Aortic stenosis   Hypertension   Chest pain   Chronic atrial fibrillation (HCC)   Esophageal reflux   Hyponatremia   Diarrhea   Dementia (HCC)   Chronic ulcer of left foot (HCC)   Streptococcal bacteremia  Sepsis secondary to group C streptococcus bacteremia with left foot cellulitis and ulcer.  Currently on Rocephin IV.  ID on board.  Repeat blood cultures are negative so far.  Status post PICC line placement.  TEE was performed today without any endocarditis.  Continue wound care.    Chronic atrial fibrillation:  - Rate is controlled, continue diltiazem and Coumadin pharmacy to dose.  Chest pain in patient with hx CAD s/p RCA DES 2015: Troponin trend reassuring 0.10 > 0.10 >  0.09 with no ischemic ECG changes.  Cardiology was consulted and did not recommend further cardiac work-up including cardiac intervention.  Patient was then started on Imdur for antianginal effect.  Continue oxygen as needed.  On statins Coumadin.Patient has been a nonresponder to plavix and not taking aspirin due to hx hiatal hernia. No longer taking atenolol.  New PPI.  Patient has been seen by cardiology and will follow up with cardiology as outpatient.  Chronic diastolic CHF:  Echo April 2019 w/EF 46-56%, diastolic function not mentioned due to AFib. - Stopped IVF 3/7, and received Demadex 3/8. BUN:Cr >20:1 and edema improved.  Improved at this time.    History of brioprosthetic AVR: Mild stenosis on most recent echo. Continue coumadin with INR goal 2-3, dosing per pharmacy.    Pulmonary HTN, emphysema:  Patient will have follow-up with pulmonology, Dr. Lamonte Sakai 4/14.  Currently stable  Left foot drop: s/p orthopedic procedure 1987.  - Fall precautions.  Continue supportive care.  Physical therapy, Occupational Therapy on discharge  Hyponatremia: Have mild baseline hyponatremia.  Slightly downtrending sodium levels today to 129.  Will check BMP in a.m.  Fall at home:  - Fall precautions, PT, OT evaluation recommend home health care.  Patient is currently from assisted living facility.  Dyslipidemia:  - Continue statin  GERD:  - PPI   VTE Prophylaxis: Warfarin  Code Status: DNR  Family Communication: No one was available at bedside  Disposition Plan: Assisted living facility with home health, OT and PT on discharge.  Likely in 1 to 2 days.  Follow infectious disease recommendation.   Consultants:  Cardiology  Procedures:  2 D Echocardiogram 06/16/2018: 1. The left ventricle has hyperdynamic systolic function, with an ejection fraction of >65%. The cavity size was normal. There is moderate concentric left ventricular hypertrophy. Left ventricular diastology could not be  evaluated secondary to atrial  fibrillation. 2. The right ventricle has normal systolic function. The cavity was normal. There is no increase in right ventricular wall thickness. Right ventricular systolic pressure is severely elevated with an estimated pressure of 64.4 mmHg. 3. Left atrial size was severely dilated. 4. Right atrial size was severely dilated. 5. The mitral valve is degenerative. Moderate thickening of the mitral valve leaflet. Moderate calcification of the mitral valve leaflet. There is severe mitral annular calcification present. Mitral valve regurgitation is mild to moderate by color flow  Doppler. 6. The tricuspid valve is normal in structure. Tricuspid valve regurgitation is severe. 7. The aortic valve is tricuspid Moderate thickening of the aortic valve Moderate calcification of the aortic valve. moderate-severe stenosis of the aortic valve. No vegetaion is seen. 8. The pulmonic valve was normal in structure. 9. The inferior vena cava was dilated in size with <50% respiratory variability  Antibiotics: Anti-infectives (From admission, onward)   Start     Dose/Rate Route Frequency Ordered Stop   06/15/18 0500  cefTRIAXone (ROCEPHIN) 2 g in sodium chloride 0.9 % 100 mL IVPB     2 g 200 mL/hr over 30 Minutes Intravenous Daily 06/15/18 0131     06/14/18 2200  ceFEPIme (MAXIPIME) 1 g in sodium chloride 0.9 % 100 mL IVPB  Status:  Discontinued     1 g 200 mL/hr over 30 Minutes Intravenous Every 12 hours 06/14/18 1150 06/15/18 0130   06/14/18 1015  ceFEPIme (MAXIPIME) 2 g in sodium chloride 0.9 % 100 mL IVPB     2 g 200 mL/hr over 30 Minutes Intravenous  Once 06/14/18 1007 06/14/18 1253   06/14/18 1015  vancomycin (VANCOCIN) IVPB 1000 mg/200 mL premix     1,000 mg 200 mL/hr over 60 Minutes Intravenous  Once 06/14/18 1007 06/14/18 1352       Objective: Vitals:   06/20/18 1140 06/20/18 1220  BP: 109/74 (!) 178/91  Pulse: 92 (!) 103  Resp: 16 18  Temp:  98.6 F  (37 C)  SpO2: 93% 98%    Intake/Output Summary (Last 24 hours) at 06/20/2018 1234 Last data filed at 06/20/2018 0645 Gross per 24 hour  Intake 240 ml  Output -  Net 240 ml   Filed Weights   06/15/18 1306 06/19/18 0418 06/20/18 0407  Weight: 62.2 kg 61.7 kg 62.3 kg   Body mass index is 25.95 kg/m.   Physical Exam: GENERAL: Patient is alert awake and communicative. Not in obvious distress.  Elderly female lying supine in bed.  Thinly built.  Hard of hearing. HENT: No scleral pallor or icterus. Pupils equally reactive to light. Oral mucosa is moist NECK: is supple, no palpable thyroid enlargement. CHEST: Clear to auscultation. No crackles or wheezes. Non tender on palpation. Diminished breath sounds bilaterally. CVS: S1 and S2 heard, no murmur.  Irregularly irregular rhythm. No pericardial rub. ABDOMEN: Soft, non-tender, bowel sounds are present. No palpable hepato-splenomegaly. EXTREMITIES left leg with erythema mild tenderness.  Erythema has not expanded beyond the demarcation. CNS: Cranial nerves are intact. No focal motor or sensory deficits. SKIN: warm and dry without rashes.  Data Review: I have personally reviewed the following laboratory data and studies,  CBC:  Recent Labs  Lab 06/14/18 0950  06/16/18 0347 06/17/18 0823 06/18/18 0316 06/19/18 0831 06/20/18 0403  WBC 19.0*   < > 29.2* 18.6* 13.8* 11.6* 10.5  NEUTROABS 17.6*  --   --   --  10.2*  --   --   HGB 13.3   < > 10.4* 9.2* 9.0* 9.3* 8.4*  HCT 40.4   < > 33.2* 28.9* 28.4* 28.4* 26.1*  MCV 91.4   < > 94.9 91.7 92.2 90.2 90.3  PLT 256   < > 154 147* 162 157 156   < > = values in this interval not displayed.   Basic Metabolic Panel: Recent Labs  Lab 06/16/18 0347 06/17/18 0752 06/18/18 0316 06/19/18 0831 06/20/18 0403  NA 129* 128* 132* 130* 129*  K 4.3 4.3 4.0 4.2 4.0  CL 99 96* 95* 96* 92*  CO2 21* 27 30 26 28   GLUCOSE 101* 98 122* 107* 131*  BUN 16 16 20 14 22   CREATININE 0.67 0.56 0.76 0.55  0.72  CALCIUM 7.9* 7.6* 7.8* 8.1* 7.9*   Liver Function Tests: Recent Labs  Lab 06/14/18 0950 06/17/18 0752  AST 31 28  ALT 26 22  ALKPHOS 90 96  BILITOT 1.2 0.3  PROT 8.2* 5.6*  ALBUMIN 3.8 2.2*   No results for input(s): LIPASE, AMYLASE in the last 168 hours. No results for input(s): AMMONIA in the last 168 hours. Cardiac Enzymes: Recent Labs  Lab 06/17/18 0752 06/17/18 1300 06/17/18 1900  TROPONINI 0.10* 0.10* 0.09*   BNP (last 3 results) No results for input(s): BNP in the last 8760 hours.  ProBNP (last 3 results) No results for input(s): PROBNP in the last 8760 hours.  CBG: No results for input(s): GLUCAP in the last 168 hours. Recent Results (from the past 240 hour(s))  Culture, blood (Routine x 2)     Status: Abnormal   Collection Time: 06/14/18  9:50 AM  Result Value Ref Range Status   Specimen Description   Final    BLOOD RIGHT ANTECUBITAL Performed at [facility] [address], [place], [place] [address]    Special Requests   Final    BOTTLES DRAWN AEROBIC AND ANAEROBIC Blood Culture results may not be optimal due to an excessive volume of blood received in culture bottles Performed at [facility] [address], [place], [place] [address]    Culture  Setup Time   Final    GRAM POSITIVE COCCI IN BOTH AEROBIC AND ANAEROBIC BOTTLES CRITICAL RESULT CALLED TO, READ BACK BY AND VERIFIED WITH: B GREEN PHARMD 06/14/18 2321 JDW Performed at [facility], [place] [address] [place], [place] [address]    Culture STREPTOCOCCUS GROUP G (A)  Final   Report Status 06/17/2018 FINAL  Final   Organism ID, Bacteria STREPTOCOCCUS GROUP G  Final      Susceptibility   Streptococcus group g - MIC*    CLINDAMYCIN <=0.25 SENSITIVE Sensitive     AMPICILLIN <=0.25 SENSITIVE Sensitive     ERYTHROMYCIN <=0.12 SENSITIVE Sensitive     VANCOMYCIN <=0.12 SENSITIVE Sensitive     CEFTRIAXONE <=0.12 SENSITIVE Sensitive     LEVOFLOXACIN  0.5 SENSITIVE Sensitive     * STREPTOCOCCUS GROUP G  Blood Culture ID Panel (Reflexed)     Status: Abnormal   Collection Time: 06/14/18  9:50 AM  Result Value Ref Range Status   Enterococcus species NOT DETECTED NOT DETECTED Final   Listeria monocytogenes NOT DETECTED NOT DETECTED  Final   Staphylococcus species NOT DETECTED NOT DETECTED Final   Staphylococcus aureus (BCID) NOT DETECTED NOT DETECTED Final   Streptococcus species DETECTED (A) NOT DETECTED Final    Comment: Not Enterococcus species, Streptococcus agalactiae, Streptococcus pyogenes, or Streptococcus pneumoniae. CRITICAL RESULT CALLED TO, READ BACK BY AND VERIFIED WITH: B GREEN PHARMD 06/14/18 2321 JDW    Streptococcus agalactiae NOT DETECTED NOT DETECTED Final   Streptococcus pneumoniae NOT DETECTED NOT DETECTED Final   Streptococcus pyogenes NOT DETECTED NOT DETECTED Final   Acinetobacter baumannii NOT DETECTED NOT DETECTED Final   Enterobacteriaceae species NOT DETECTED NOT DETECTED Final   Enterobacter cloacae complex NOT DETECTED NOT DETECTED Final   Escherichia coli NOT DETECTED NOT DETECTED Final   Klebsiella oxytoca NOT DETECTED NOT DETECTED Final   Klebsiella pneumoniae NOT DETECTED NOT DETECTED Final   Proteus species NOT DETECTED NOT DETECTED Final   Serratia marcescens NOT DETECTED NOT DETECTED Final   Haemophilus influenzae NOT DETECTED NOT DETECTED Final   Neisseria meningitidis NOT DETECTED NOT DETECTED Final   Pseudomonas aeruginosa NOT DETECTED NOT DETECTED Final   Candida albicans NOT DETECTED NOT DETECTED Final   Candida glabrata NOT DETECTED NOT DETECTED Final   Candida krusei NOT DETECTED NOT DETECTED Final   Candida parapsilosis NOT DETECTED NOT DETECTED Final   Candida tropicalis NOT DETECTED NOT DETECTED Final    Comment: Performed at [facility], [place] [address], [place], [place] [address]  Culture, Urine     Status: Abnormal   Collection Time: 06/14/18 11:17 AM  Result Value Ref Range  Status   Specimen Description   Final    URINE, RANDOM Performed at [facility] [address], [place], [place] [address]    Special Requests   Final    NONE Performed at [facility], [place] [address] [place], [place] [address]    Culture (A)  Final    <10,000 COLONIES/mL INSIGNIFICANT GROWTH Performed at [facility] [address] [place], [place] [address]    Report Status 06/16/2018 FINAL  Final  Culture, blood (Routine x 2)     Status: None   Collection Time: 06/14/18 11:18 AM  Result Value Ref Range Status   Specimen Description   Final    BLOOD RIGHT HAND Performed at [facility] [address], [place], [place] [address]    Special Requests   Final    BOTTLES DRAWN AEROBIC ONLY Blood Culture results may not be optimal due to an inadequate volume of blood received in culture bottles Performed at [facility] [address] [place], [place] [address]    Culture   Final    NO GROWTH 5 DAYS Performed at [facility], [place] [address], [place], [place] [address]    Report Status 06/19/2018 FINAL  Final  MRSA PCR Screening     Status: None   Collection Time: 06/14/18  5:52 PM  Result Value Ref Range Status   MRSA by PCR NEGATIVE NEGATIVE Final    Comment:        The GeneXpert MRSA Assay (FDA approved for NASAL specimens only), is one component of a comprehensive MRSA colonization surveillance program. It is not intended to diagnose MRSA infection nor to guide or monitor treatment for MRSA infections. Performed at [facility], [place] [address], [place], [place] [address]   C difficile quick scan w PCR reflex     Status: None  Collection Time: 06/15/18  5:35 AM  Result Value Ref Range Status   C Diff antigen NEGATIVE NEGATIVE Final   C Diff toxin NEGATIVE NEGATIVE Final   C Diff interpretation No C. difficile detected.  Final    Comment: Performed at  [facility], [place] [address] [place], [place] [address]  Culture, blood (routine x 2)     Status: None (Preliminary result)   Collection Time: 06/17/18  4:03 AM  Result Value Ref Range Status   Specimen Description   Final    BLOOD LEFT ANTECUBITAL Performed at [facility] [address] [place], [place] [address]    Special Requests   Final    BOTTLES DRAWN AEROBIC AND ANAEROBIC Blood Culture adequate volume Performed at [facility] [address] [place], [place] [address]    Culture   Final    NO GROWTH 3 DAYS Performed at [facility], [place] [address], [place], [place] [address]    Report Status PENDING  Incomplete  Culture, blood (routine x 2)     Status: None (Preliminary result)   Collection Time: 06/17/18  4:03 AM  Result Value Ref Range Status   Specimen Description   Final    BLOOD LEFT ARM Performed at [facility], [place] [address] [place], [place] [address]    Special Requests   Final    BOTTLES DRAWN AEROBIC ONLY Blood Culture adequate volume Performed at [facility] [address] [place], [place] [address]    Culture   Final    NO GROWTH 3 DAYS Performed at [facility], [place] [address] [place], [place] [address]    Report Status PENDING  Incomplete     Studies: No results found.  Scheduled Meds: . atorvastatin  20 mg Oral Daily  . busPIRone  15 mg Oral BID  . cycloSPORINE  1 drop Both Eyes BID  . diltiazem  120 mg Oral BID  . feeding supplement (ENSURE ENLIVE)  237 mL Oral BID BM  . feeding supplement (PRO-STAT SUGAR FREE 64)  30 mL Oral BID  . isosorbide mononitrate  15 mg Oral Daily  . multivitamin  1 tablet Oral BID  . oxybutynin  5 mg Oral Daily  . pantoprazole  20 mg Oral Daily  . polyvinyl alcohol  1 drop Both Eyes BID  . sodium chloride flush  10-40 mL Intracatheter Q12H  . warfarin  5 mg Oral ONCE-1800  . Warfarin - Pharmacist Dosing  Inpatient   Does not apply q1800    Continuous Infusions: . cefTRIAXone (ROCEPHIN)  IV 2 g (06/20/18 0805)     Flora Lipps, MD  [facility] 06/20/2018

## 2018-06-20 NOTE — Progress Notes (Signed)
  Echocardiogram Echocardiogram Transesophageal has been performed.  Brittany [name] 06/20/2018, 11:36 AM

## 2018-06-20 NOTE — Progress Notes (Signed)
PHARMACY CONSULT NOTE FOR:  OUTPATIENT  PARENTERAL ANTIBIOTIC THERAPY (OPAT)  Indication: Group G streptococcal bacteremia Regimen: Ceftriaxone 2g IV q24h End date: 06/29/18  IV antibiotic discharge orders are pended. To discharging provider:  please sign these orders via discharge navigator,  Select New Orders & click on the button choice - Manage This Unsigned Work.     Thank you for allowing pharmacy to be a part of this patient's care.  Peggyann Juba, PharmD, BCPS Pager: [phone] 06/20/2018, 1:18 PM

## 2018-06-20 NOTE — CV Procedure (Signed)
   Transesophageal Echocardiogram  Indications: Bacteremia, AVR  Time out performed  During this procedure the patient is administered a total of Versed 1 mg and Fentanyl 25 mcg to achieve and maintain moderate conscious sedation.  The patient's heart rate, blood pressure, and oxygen saturation are monitored continuously during the procedure. The period of conscious sedation is 24 minutes, of which I was present face-to-face 100% of this time.  Findings:  Left Ventricle:  Normal EF 55%  Mitral Valve: Moderate MR, PISA 0.7, ERO 0.15, no PV flow reversal.   Aortic Valve: Edwards bioprosthesis. Thickened. NO VEGETATION, no abscess. Well seated.   Tricuspid Valve: Severe TR. Redundant eustachian valve.   Right atrium: 5.3cm diameter, dilated  Left Atrium: 5.2cm diameter, dilated. No thrombus.   Impression: NO VEGETATIONS  Candee Furbish, MD

## 2018-06-20 NOTE — Progress Notes (Signed)
[place] for warfarin Indication: atrial fibrillation and aortic valve replacement  Allergies  Allergen Reactions  . Amlodipine Swelling and Other (See Comments)     Unspecified swelling reaction  . Amoxicillin Diarrhea, Nausea And Vomiting and Other (See Comments)    Has patient had a PCN reaction causing immediate rash, facial/tongue/throat swelling, SOB or lightheadedness with hypotension: No Has patient had a PCN reaction causing severe rash involving mucus membranes or skin necrosis: No Has patient had a PCN reaction that required hospitalization: No Has patient had a PCN reaction occurring within the last 10 years: No If all of the above answers are "NO", then may proceed with Cephalosporin use.  . Carafate [Sucralfate] Swelling and Other (See Comments)    Reaction:  Knee swelling/redness   . Cephalexin Diarrhea  . Codeine Nausea And Vomiting  . Irbesartan Swelling and Other (See Comments)    Reaction:  Facial/hand swelling and numbness   . Morphine And Related Other (See Comments)    Pt states that she has a history of addiction with this medication.      . Neurontin [Gabapentin] Swelling and Other (See Comments)    Reaction:  Leg swelling   . Nitrofurantoin Monohyd Macro Diarrhea and Nausea And Vomiting  . Prednisone Other (See Comments)    Reaction:  Elevated BP  . Sulfa Antibiotics Rash    Patient Measurements: Height: 5\' 1"  (154.9 cm) Weight: 137 lb 5.6 oz (62.3 kg) IBW/kg (Calculated) : 47.8  Vital Signs: Temp: 97.9 F (36.6 C) (03/11 0407) Temp Source: Oral (03/11 0407) BP: 135/70 (03/11 0407) Pulse Rate: 78 (03/11 0407)  Labs: Recent Labs    06/17/18 0752  06/17/18 1300 06/17/18 1900 06/18/18 0316 06/19/18 0336 06/19/18 0831 06/20/18 0403  HGB  --    < >  --   --  9.0*  --  9.3* 8.4*  HCT  --    < >  --   --  28.4*  --  28.4* 26.1*  PLT  --    < >  --   --  162  --  157 156  LABPROT  --   --   --   --  25.7*  27.2*  --  25.3*  INR  --   --   --   --  2.4* 2.6*  --  2.3*  CREATININE 0.56  --   --   --  0.76  --  0.55 0.72  TROPONINI 0.10*  --  0.10* 0.09*  --   --   --   --    < > = values in this interval not displayed.    Estimated Creatinine Clearance: 40.3 mL/min (by C-G formula based on SCr of 0.72 mg/dL).   Assessment: 85 yo female presented to ER after fall with fever and tachycardia. Pt takes warfarin PTA for atrial fibrillation and history of aortic valve replacement. Pharmacy consulted to dose/monitor warfarin during admission. INR 1.9 therapeutic on admission. LD PTA 3/4   Home dose/INR goal (confirmed with outpatient clinic note from 06/08/18): Dose: Warfarin 2.5 mg on Tuesday & Sat; 5 mg on all other days of the week    Today, 06/20/18  INR 2.3 is therapeutic  Hgb 8.3 trending down from admission.   Plt 156 - low but stable  Pt is on antibiotic (ceftriaxone) which can increase warfarin sensitivity.  Warfarin dose 3/5 not charted as being given.  Goal of Therapy:  INR 2-3  Plan:   Repeat warfarin 5 mg po x 1 dose at 1800  INR daily  Closely monitor for signs/symptoms of bleeding or thrombosis  Peggyann Juba, PharmD, BCPS Pager: [phone] 06/20/2018 6:56 AM

## 2018-06-21 ENCOUNTER — Other Ambulatory Visit: Payer: Self-pay

## 2018-06-21 LAB — BASIC METABOLIC PANEL
Anion gap: 8 (ref 5–15)
BUN: 21 mg/dL (ref 8–23)
CALCIUM: 8.1 mg/dL — AB (ref 8.9–10.3)
CO2: 30 mmol/L (ref 22–32)
Chloride: 94 mmol/L — ABNORMAL LOW (ref 98–111)
Creatinine, Ser: 0.59 mg/dL (ref 0.44–1.00)
GFR calc Af Amer: >60 mL/min (ref >60)
GFR calc non Af Amer: >60 mL/min (ref >60)
Glucose, Bld: 107 mg/dL — ABNORMAL HIGH (ref 70–99)
Potassium: 4.2 mmol/L (ref 3.5–5.1)
SODIUM: 132 mmol/L — AB (ref 135–145)

## 2018-06-21 LAB — PROTIME-INR
INR: 1.9 — ABNORMAL HIGH (ref 0.8–1.2)
Prothrombin Time: 21.1 seconds — ABNORMAL HIGH (ref 11.4–15.2)

## 2018-06-21 LAB — CBC
HCT: 25.4 % — ABNORMAL LOW (ref 36.0–46.0)
Hemoglobin: 8.3 g/dL — ABNORMAL LOW (ref 12.0–15.0)
MCH: 29.5 pg (ref 26.0–34.0)
MCHC: 32.7 g/dL (ref 30.0–36.0)
MCV: 90.4 fL (ref 80.0–100.0)
PLATELETS: 259 10*3/uL (ref 150–400)
RBC: 2.81 MIL/uL — ABNORMAL LOW (ref 3.87–5.11)
RDW: 13.7 % (ref 11.5–15.5)
WBC: 10.2 10*3/uL (ref 4.0–10.5)
nRBC: 0 % (ref 0.0–0.2)

## 2018-06-21 MED ORDER — WARFARIN SODIUM 5 MG PO TABS: 5.0000 mg | ORAL_TABLET | Freq: Once | ORAL | Status: DC

## 2018-06-21 MED ORDER — ISOSORBIDE MONONITRATE ER 30 MG PO TB24: 15.0000 mg | ORAL_TABLET | Freq: Every day | ORAL | 2 refills | Status: DC

## 2018-06-21 MED ORDER — HEPARIN SOD (PORK) LOCK FLUSH 100 UNIT/ML IV SOLN
250.0000 [IU] | INTRAVENOUS | Status: AC | PRN
  Administered 2018-06-21: 250 [IU]

## 2018-06-21 MED ORDER — WARFARIN SODIUM 5 MG PO TABS: ORAL_TABLET | ORAL | 2 refills | Status: DC

## 2018-06-21 NOTE — Progress Notes (Signed)
Occupational Therapy Treatment Patient Details Name: Brittany [name] MRN: [id number] DOB: 05/08/1927 Today's Date: 06/21/2018    History of present illness 85 y.o. female with a history of cirrhosis, chronic AFib, chronic back pain, left foot drop, HTN, gastritis/GERD, pulmonary HTN, AVR 2005 on coumadin, and bacterial endocarditis who presented from ALF with fever, weakness, and an unwitnessed fall. She was found to be febrile and admitted for Sepsis due to group G strep bacteremia, source suspected to be left foot ulcer with cellulitis   OT comments  Plan is to have 24/7 A at daughters home  Follow Up Recommendations  Home health OT;Supervision/Assistance - 24 hour          Precautions / Restrictions Precautions Precautions: Fall Precaution Comments: L foot drop (unable to wear AFO since last year due to chronic wound)       Mobility Bed Mobility               General bed mobility comments: pt in chair  Transfers Overall transfer level: Needs assistance Equipment used: Rolling walker (2 wheeled) Transfers: Sit to/from [facility] Sit to Stand: Min guard Stand pivot transfers: Min guard       General transfer comment: used armrests appropriately, assist for safety/balance    Balance Overall balance assessment: History of Falls;Mild deficits observed, not formally tested                                         ADL either performed or assessed with clinical judgement   ADL Overall ADL's : Needs assistance/impaired Eating/Feeding: Set up                       Toilet Transfer: Minimal assistance;BSC;RW;Stand-pivot;Cueing for safety   Toileting- Clothing Manipulation and Hygiene: Sit to/from stand;Cueing for compensatory techniques;Cueing for safety;Minimal assistance       Functional mobility during ADLs: Minimal assistance General ADL Comments: pt stated she will be going to her daugthers and will have care with all  ADL activity rather than back to ILF.     Vision Baseline Vision/History: Macular Degeneration            Cognition Arousal/Alertness: Awake/alert Behavior During Therapy: WFL for tasks assessed/performed Overall Cognitive Status: Within Functional Limits for tasks assessed                                                     Pertinent Vitals/ Pain       Pain Assessment: No/denies pain         Frequency  Min 2X/week        Progress Toward Goals  OT Goals(current goals can now be found in the care plan section)  Progress towards OT goals: Progressing toward goals     Plan Discharge plan remains appropriate       AM-PAC OT "6 Clicks" Daily Activity     Outcome Measure   Help from another person eating meals?: A Little Help from another person taking care of personal grooming?: A Little Help from another person toileting, which includes using toliet, bedpan, or urinal?: A Little Help from another person bathing (including washing, rinsing, drying)?: A Little Help from another person to put on and  taking off regular upper body clothing?: A Little Help from another person to put on and taking off regular lower body clothing?: A Little 6 Click Score: 18    End of Session Equipment Utilized During Treatment: Rolling walker  OT Visit Diagnosis: Unsteadiness on feet (R26.81);Repeated falls (R29.6);Muscle weakness (generalized) (M62.81);History of falling (Z91.81)   Activity Tolerance Patient tolerated treatment well   Patient Left in chair;with call bell/phone within reach;with chair alarm set   Nurse Communication Mobility status        Time: 3267-1245 OT Time Calculation (min): 12 min  Charges: OT General Charges $OT Visit: 1 Visit OT Treatments $Self Care/Home Management : 8-22 mins  Kari Baars,  [phone] Office- [phone]      [place], Edwena Felty D 06/21/2018, 4:08 PM

## 2018-06-21 NOTE — Progress Notes (Signed)
[place] for warfarin Indication: atrial fibrillation and aortic valve replacement  Allergies  Allergen Reactions  . Amlodipine Swelling and Other (See Comments)     Unspecified swelling reaction  . Amoxicillin Diarrhea, Nausea And Vomiting and Other (See Comments)    Has patient had a PCN reaction causing immediate rash, facial/tongue/throat swelling, SOB or lightheadedness with hypotension: No Has patient had a PCN reaction causing severe rash involving mucus membranes or skin necrosis: No Has patient had a PCN reaction that required hospitalization: No Has patient had a PCN reaction occurring within the last 10 years: No If all of the above answers are "NO", then may proceed with Cephalosporin use.  . Carafate [Sucralfate] Swelling and Other (See Comments)    Reaction:  Knee swelling/redness   . Cephalexin Diarrhea  . Codeine Nausea And Vomiting  . Irbesartan Swelling and Other (See Comments)    Reaction:  Facial/hand swelling and numbness   . Morphine And Related Other (See Comments)    Pt states that she has a history of addiction with this medication.      . Neurontin [Gabapentin] Swelling and Other (See Comments)    Reaction:  Leg swelling   . Nitrofurantoin Monohyd Macro Diarrhea and Nausea And Vomiting  . Prednisone Other (See Comments)    Reaction:  Elevated BP  . Sulfa Antibiotics Rash    Patient Measurements: Height: 5\' 1"  (154.9 cm) Weight: 135 lb 2.3 oz (61.3 kg) IBW/kg (Calculated) : 47.8  Vital Signs: Temp: 97.8 F (36.6 C) (03/12 0409) Temp Source: Oral (03/12 0409) BP: 116/67 (03/12 0409) Pulse Rate: 82 (03/12 0409)  Labs: Recent Labs    06/19/18 0336  06/19/18 0831 06/20/18 0403 06/21/18 0442  HGB  --    < > 9.3* 8.4* 8.3*  HCT  --   --  28.4* 26.1* 25.4*  PLT  --   --  157 156 259  LABPROT 27.2*  --   --  25.3* 21.1*  INR 2.6*  --   --  2.3* 1.9*  CREATININE  --   --  0.55 0.72 0.59   < > = values in  this interval not displayed.    Estimated Creatinine Clearance: 40 mL/min (by C-G formula based on SCr of 0.59 mg/dL).   Assessment: 85 yo female presented to ER after fall with fever and tachycardia. Pt takes warfarin PTA for atrial fibrillation and history of aortic valve replacement. Pharmacy consulted to dose/monitor warfarin during admission. INR 1.9 therapeutic on admission. LD PTA 3/4   Home dose/INR goal (confirmed with outpatient clinic note from 06/08/18): Dose: Warfarin 2.5 mg on Tuesday & Sat; 5 mg on all other days of the week    Today, 06/21/18  INR 1.9 is just below goal range  Hgb 8.3 trending down from admission.   Plt 259 - WNL-improved today  Pt is on antibiotic (ceftriaxone) which can increase warfarin sensitivity.  Warfarin dose 3/5 not charted as being given.  Goal of Therapy:  INR 2-3   Plan:   Repeat warfarin 5 mg po x 1 dose at 1800 (home dose)  INR daily  Closely monitor for signs/symptoms of bleeding or thrombosis  Netta Cedars, PharmD, BCPS Pager: [phone] 06/21/2018 7:10 AM

## 2018-06-21 NOTE — Progress Notes (Signed)
NP on call made aware of patient c/o chest pain with administration of morphine, nitro and obtaining EKG, no new orders received. Patient resting quietly in bed at this time in no further complain.

## 2018-06-21 NOTE — Discharge Summary (Signed)
Physician Discharge Summary  Brittany [name] VQQ:[id number] DOB: 15-Nov-1927 DOA: 06/14/2018  PCP: Mayra Neer, MD  Admit date: 06/14/2018 Discharge date: 06/21/2018  Admitted From: Home  Discharge disposition: Home with home health   Recommendations for Outpatient Follow-Up:    Follow up with your primary care provider in one week.  Follow-up with Dr. Johnnye Sima infectious disease and with cardiology as outpatient.   Discharge Diagnosis:   Principal Problem:   Sepsis secondary to UTI [facility]) Active Problems:   Aortic stenosis   Hypertension   Chest pain   Chronic atrial fibrillation (HCC)   Esophageal reflux   Hyponatremia   Diarrhea   Dementia (HCC)   Chronic ulcer of left foot ([place])   Streptococcal bacteremia    Discharge Condition: Improved.  Diet recommendation: Low sodium, heart healthy.   Wound care: None.  Code status: DNR   History of Present Illness:   Brittany [name] an 85 y.o.femalewith last medical history of cirrhosis, chronic AFib, chronic back pain, HTN, gastritis/GERD, pulmonary HTN, AVR 2005 on coumadin, and bacterial endocarditis who presented from ALF with fever, weakness, and an unwitnessed fall. She was found to be febrile to 102.52F, tachypneic but not hypoxic. WBC 19k, INR 1.9 on presentation.. CXR demonstrated stable cardiomegaly and pleural effusions. Flu was negative. She's had a chronic left foot ulcer under 3x weekly treatment by wound care RN and recently was noted to have malodorous discharge, now with increasing erythema.  X-ray of the foot did not reveal osteomyelitis.  Vancomycin and cefepime were started after blood cultures were drawn.  Blood cultures  Grew group G strep.  Infectious disease was consulted.  Antibiotics narrowed down to ceftriaxone.  Patient plan underwent TEE on 06/20/2018.   Hospital Course:   Sepsis secondary to group C streptococcus bacteremia with left foot cellulitis and ulcer.  Present on admission.   Received Rocephin IV.  ID on board.  Repeat blood cultures are negative so far.  Status post PICC line placement.  TEE was performed  without any vegetation.  Patient will need to continue IV Rocephin as outpatient to complete the course through the PICC line.  Chronic atrial fibrillation:  - Rate  controlled, continued diltiazem and Coumadin   Chest pain, angina in patient with hx CAD s/p RCA DES 2015: Troponin trend reassuring 0.10 >0.10 >0.09 with no ischemic ECG changes.  Cardiology was consulted and did not recommend further cardiac work-up including cardiac intervention.  Patient was then started on Imdur for antianginal effect.    Continued on statins Coumadin. Patient has been a nonresponder to plavix and not taking aspirin due to hx hiatal hernia. Patient will follow up with cardiology as outpatient.  Chronic diastolic CHF:  Echo April 2019 w/EF 43-32%, diastolic function not mentioned due to AFib. - Stopped IVF 3/7, and received Demadex 3/8. BUN:Cr >20:1 and edema improved.  Improved at this time.    History of brioprosthetic AVR: Mild stenosis on most recent echo. Continue coumadin with INR goal 2-3,   Pulmonary HTN, emphysema:  Patient will have follow-up with pulmonology, Dr. Lamonte Sakai 4/14.  Currently stable  Left foot drop: s/p orthopedic procedure 1987.   Hyponatremia: Have mild baseline hyponatremia.  Will need to follow-up as outpatient.  Fall at home:  - Fall precautions, PT, OT evaluation recommend home health care.  Patient is currently from independent living facility.  Dyslipidemia:  - Continue statin  GERD:  - PPI  Disposition:  At this time patient is stable for disposition  home with home health services.  I spoke with the patient's son at bedside at length including the patient's daughter on the phone during the plan for disposition and follow-up..  Medical Consultants:    Cardiology, Infectious disease   Subjective:   Today, patient feels  overall okay.  Has mild nausea.  Has a mild leg pain on touching  Discharge Exam:   Vitals:   06/21/18 0054 06/21/18 0409  BP: (!) 95/50 116/67  Pulse: 71 82  Resp:  18  Temp:  97.8 F (36.6 C)  SpO2:  94%   Vitals:   06/20/18 2344 06/21/18 0054 06/21/18 0409 06/21/18 0700  BP: 123/60 (!) 95/50 116/67   Pulse: 89 71 82   Resp:   18   Temp:   97.8 F (36.6 C)   TempSrc:   Oral   SpO2: 92%  94%   Weight:    61.3 kg  Height:        General exam: Appears calm and comfortable ,Not in distress.  Hard of hearing. HEENT:PERRL,Oral mucosa moist Respiratory system: Bilateral equal air entry, normal vesicular breath sounds, no wheezes or crackles  Cardiovascular system: S1 & S2 heard, irregular rhythm Gastrointestinal system: Abdomen is nondistended, soft and nontender. No organomegaly or masses felt. Normal bowel sounds heard. Central nervous system: Alert and oriented. No focal neurological deficits. Extremities: Left leg with erythema and mild tenderness.  Erythema has not expanded beyond the demarcation.  Distal peripheral pulses palpable. Skin: Left leg erythema with mild tenderness MSK: Normal muscle bulk,tone ,power    Procedures:    2D Echocardiogram 06/16/2018: 1. The left ventricle has hyperdynamic systolic function, with an ejection fraction of >65%. The cavity size was normal. There is moderate concentric left ventricular hypertrophy. Left ventricular diastology could not be evaluated secondary to atrial  fibrillation. 2. The right ventricle has normal systolic function. The cavity was normal. There is no increase in right ventricular wall thickness. Right ventricular systolic pressure is severely elevated with an estimated pressure of 64.4 mmHg. 3. Left atrial size was severely dilated. 4. Right atrial size was severely dilated. 5. The mitral valve is degenerative. Moderate thickening of the mitral valve leaflet. Moderate calcification of the mitral valve leaflet.  There is severe mitral annular calcification present. Mitral valve regurgitation is mild to moderate by color flow  Doppler. 6. The tricuspid valve is normal in structure. Tricuspid valve regurgitation is severe. 7. The aortic valve is tricuspid Moderate thickening of the aortic valve Moderate calcification of the aortic valve. moderate-severe stenosis of the aortic valve. No vegetaion is seen. 8. The pulmonic valve was normal in structure. 9. The inferior vena cava was dilated in size with <50% respiratory variability  The results of significant diagnostics from this hospitalization (including imaging, microbiology, ancillary and laboratory) are listed below for reference.     Diagnostic Studies:   Dg Chest 2 View  Result Date: 06/14/2018 CLINICAL DATA:  Cough for 3 days COMPARISON:  Radiograph 05/08/2018 FINDINGS: Sternotomy wires overlie enlarged cardiac silhouette. Small pleural effusion seen on lateral projection. Upper lungs are clear. No focal consolidation. Atherosclerotic calcification of the aorta. IMPRESSION: Cardiomegaly and bilateral effusions. Findings suggest congestive heart failure Electronically Signed   By: Suzy Bouchard M.D.   On: 06/14/2018 10:39   Dg Lumbar Spine Complete  Result Date: 06/14/2018 CLINICAL DATA:  Post unwitnessed fall with low back pain. EXAM: LUMBAR SPINE - COMPLETE 4+ VIEW COMPARISON:  None. FINDINGS: There is no evidence of lumbar spine  fracture. Alignment is normal. Multilevel osteoarthritic changes with disc space narrowing, subchondral sclerosis, osteophyte formation. Calcific atherosclerotic disease of the aorta. IMPRESSION: 1. No acute fracture or dislocation identified about the lumbosacral spine. 2. Multilevel osteoarthritic changes, moderate. Electronically Signed   By: Fidela Salisbury M.D.   On: 06/14/2018 13:32   Dg Foot 2 Views Left  Result Date: 06/14/2018 CLINICAL DATA:  Initial evaluation for nonhealing wound at lateral left foot.  Recent trauma with fall. EXAM: LEFT FOOT - 2 VIEW COMPARISON:  Prior radiograph from 01/01/2012 FINDINGS: No acute fracture or dislocation. Fixation screws in place at the left third and fourth digits without complication. Scattered osteoarthritic changes present about the foot with underlying osteopenia. Soft tissue swelling overlies the lateral aspect of the base of the left fifth metatarsal, likely related to overlying ulcer/wound. No evidence for acute osteomyelitis. No dissecting soft tissue emphysema or other worrisome feature. Vascular calcifications noted within the lower leg. IMPRESSION: 1. Soft tissue ulceration/wound overlying the base of the left fifth metatarsal. No radiographic evidence for acute osteomyelitis. 2. No other acute osseous abnormality about the left foot. 3. Postoperative changes at the left third and fourth digits without adverse features. 4. Advanced osteopenia. Electronically Signed   By: Jeannine Boga M.D.   On: 06/14/2018 13:39     Labs:   Basic Metabolic Panel: Recent Labs  Lab 06/17/18 0752 06/18/18 0316 06/19/18 0831 06/20/18 0403 06/21/18 0442  NA 128* 132* 130* 129* 132*  K 4.3 4.0 4.2 4.0 4.2  CL 96* 95* 96* 92* 94*  CO2 _0 GLUCOSE 98 122* 107* 131* 107*  BUN _1 CREATININE 0.56 0.76 0.55 0.72 0.59  CALCIUM 7.6* 7.8* 8.1* 7.9* 8.1*   GFR Estimated Creatinine Clearance: 40 mL/min (by C-G formula based on SCr of 0.59 mg/dL). Liver Function Tests: Recent Labs  Lab 06/14/18 0950 06/17/18 0752  AST 31 28  ALT 26 22  ALKPHOS 90 96  BILITOT 1.2 0.3  PROT 8.2* 5.6*  ALBUMIN 3.8 2.2*   No results for input(s): LIPASE, AMYLASE in the last 168 hours. No results for input(s): AMMONIA in the last 168 hours. Coagulation profile Recent Labs  Lab 06/17/18 0403 06/18/18 0316 06/19/18 0336 06/20/18 0403 06/21/18 0442  INR 1.8* 2.4* 2.6* 2.3* 1.9*    CBC: Recent Labs  Lab 06/14/18 0950  06/17/18 0823  06/18/18 0316 06/19/18 0831 06/20/18 0403 06/21/18 0442  WBC 19.0*   < > 18.6* 13.8* 11.6* 10.5 10.2  NEUTROABS 17.6*  --   --  10.2*  --   --   --   HGB 13.3   < > 9.2* 9.0* 9.3* 8.4* 8.3*  HCT 40.4   < > 28.9* 28.4* 28.4* 26.1* 25.4*  MCV 91.4   < > 91.7 92.2 90.2 90.3 90.4  PLT 256   < > 147* 162 157 156 259   < > = values in this interval not displayed.   Cardiac Enzymes: Recent Labs  Lab 06/17/18 0752 06/17/18 1300 06/17/18 1900  TROPONINI 0.10* 0.10* 0.09*   BNP: Invalid input(s): POCBNP CBG: No results for input(s): GLUCAP in the last 168 hours. D-Dimer No results for input(s): DDIMER in the last 72 hours. Hgb A1c No results for input(s): HGBA1C in the last 72 hours. Lipid Profile No results for input(s): CHOL, HDL, LDLCALC, TRIG, CHOLHDL, LDLDIRECT in the last 72 hours. Thyroid function studies No results for input(s): TSH, T4TOTAL, T3FREE, THYROIDAB in the  last 72 hours.  Invalid input(s): FREET3 Anemia work up No results for input(s): VITAMINB12, FOLATE, FERRITIN, TIBC, IRON, RETICCTPCT in the last 72 hours. Microbiology Recent Results (from the past 240 hour(s))  Culture, blood (Routine x 2)     Status: Abnormal   Collection Time: 06/14/18  9:50 AM  Result Value Ref Range Status   Specimen Description   Final    BLOOD RIGHT ANTECUBITAL Performed at [facility] [address] [place], [place] [address]    Special Requests   Final    BOTTLES DRAWN AEROBIC AND ANAEROBIC Blood Culture results may not be optimal due to an excessive volume of blood received in culture bottles Performed at [facility] [address], [place], [place] [address]    Culture  Setup Time   Final    GRAM POSITIVE COCCI IN BOTH AEROBIC AND ANAEROBIC BOTTLES CRITICAL RESULT CALLED TO, READ BACK BY AND VERIFIED WITH: B GREEN PHARMD 06/14/18 2321 JDW Performed at [facility], [place] [address] [place], [place] [address]    Culture  STREPTOCOCCUS GROUP G (A)  Final   Report Status 06/17/2018 FINAL  Final   Organism ID, Bacteria STREPTOCOCCUS GROUP G  Final      Susceptibility   Streptococcus group g - MIC*    CLINDAMYCIN <=0.25 SENSITIVE Sensitive     AMPICILLIN <=0.25 SENSITIVE Sensitive     ERYTHROMYCIN <=0.12 SENSITIVE Sensitive     VANCOMYCIN <=0.12 SENSITIVE Sensitive     CEFTRIAXONE <=0.12 SENSITIVE Sensitive     LEVOFLOXACIN 0.5 SENSITIVE Sensitive     * STREPTOCOCCUS GROUP G  Blood Culture ID Panel (Reflexed)     Status: Abnormal   Collection Time: 06/14/18  9:50 AM  Result Value Ref Range Status   Enterococcus species NOT DETECTED NOT DETECTED Final   Listeria monocytogenes NOT DETECTED NOT DETECTED Final   Staphylococcus species NOT DETECTED NOT DETECTED Final   Staphylococcus aureus (BCID) NOT DETECTED NOT DETECTED Final   Streptococcus species DETECTED (A) NOT DETECTED Final    Comment: Not Enterococcus species, Streptococcus agalactiae, Streptococcus pyogenes, or Streptococcus pneumoniae. CRITICAL RESULT CALLED TO, READ BACK BY AND VERIFIED WITH: B GREEN PHARMD 06/14/18 2321 JDW    Streptococcus agalactiae NOT DETECTED NOT DETECTED Final   Streptococcus pneumoniae NOT DETECTED NOT DETECTED Final   Streptococcus pyogenes NOT DETECTED NOT DETECTED Final   Acinetobacter baumannii NOT DETECTED NOT DETECTED Final   Enterobacteriaceae species NOT DETECTED NOT DETECTED Final   Enterobacter cloacae complex NOT DETECTED NOT DETECTED Final   Escherichia coli NOT DETECTED NOT DETECTED Final   Klebsiella oxytoca NOT DETECTED NOT DETECTED Final   Klebsiella pneumoniae NOT DETECTED NOT DETECTED Final   Proteus species NOT DETECTED NOT DETECTED Final   Serratia marcescens NOT DETECTED NOT DETECTED Final   Haemophilus influenzae NOT DETECTED NOT DETECTED Final   Neisseria meningitidis NOT DETECTED NOT DETECTED Final   Pseudomonas aeruginosa NOT DETECTED NOT DETECTED Final   Candida albicans NOT DETECTED NOT  DETECTED Final   Candida glabrata NOT DETECTED NOT DETECTED Final   Candida krusei NOT DETECTED NOT DETECTED Final   Candida parapsilosis NOT DETECTED NOT DETECTED Final   Candida tropicalis NOT DETECTED NOT DETECTED Final    Comment: Performed at [facility], [place] [address] [place], [place] [address]  Culture, Urine     Status: Abnormal   Collection Time: 06/14/18 11:17 AM  Result Value Ref Range Status   Specimen Description  Final    URINE, RANDOM Performed at [facility], [place] [address], [place], [place] [address]    Special Requests   Final    NONE Performed at [facility], [place] [address], [place], [place] [address]    Culture (A)  Final    <10,000 COLONIES/mL INSIGNIFICANT GROWTH Performed at [facility] [address], [place], [place] [address]    Report Status 06/16/2018 FINAL  Final  Culture, blood (Routine x 2)     Status: None   Collection Time: 06/14/18 11:18 AM  Result Value Ref Range Status   Specimen Description   Final    BLOOD RIGHT HAND Performed at [facility] [address], [place], [place] [address]    Special Requests   Final    BOTTLES DRAWN AEROBIC ONLY Blood Culture results may not be optimal due to an inadequate volume of blood received in culture bottles Performed at [facility] [address], [place], [place] [address]    Culture   Final    NO GROWTH 5 DAYS Performed at [facility], [place] [address] [place], [place] [address]    Report Status 06/19/2018 FINAL  Final  MRSA PCR Screening     Status: None   Collection Time: 06/14/18  5:52 PM  Result Value Ref Range Status   MRSA by PCR NEGATIVE NEGATIVE Final    Comment:        The GeneXpert MRSA Assay (FDA approved for NASAL specimens only), is one component of a comprehensive MRSA colonization surveillance program. It is not intended to diagnose MRSA infection nor to guide or monitor  treatment for MRSA infections. Performed at [facility], [place] [address] [place], [place] [address]   C difficile quick scan w PCR reflex     Status: None   Collection Time: 06/15/18  5:35 AM  Result Value Ref Range Status   C Diff antigen NEGATIVE NEGATIVE Final   C Diff toxin NEGATIVE NEGATIVE Final   C Diff interpretation No C. difficile detected.  Final    Comment: Performed at [facility], [place] [address] [place], [place] [address]  Culture, blood (routine x 2)     Status: None (Preliminary result)   Collection Time: 06/17/18  4:03 AM  Result Value Ref Range Status   Specimen Description   Final    BLOOD LEFT ANTECUBITAL Performed at [facility] [address] [place], [place] [address]    Special Requests   Final    BOTTLES DRAWN AEROBIC AND ANAEROBIC Blood Culture adequate volume Performed at [facility] [address], [place], [place] [address]    Culture   Final    NO GROWTH 4 DAYS Performed at [facility], [place] [address], [place], [place] [address]    Report Status PENDING  Incomplete  Culture, blood (routine x 2)     Status: None (Preliminary result)   Collection Time: 06/17/18  4:03 AM  Result Value Ref Range Status   Specimen Description   Final    BLOOD LEFT ARM Performed at [facility], [place] [address] [place], [place] [address]    Special Requests   Final    BOTTLES DRAWN AEROBIC ONLY Blood Culture adequate volume Performed at [facility] [address] [place], [place] [address]    Culture   Final    NO GROWTH  4 DAYS Performed at [facility], [place] [address], [place], [place] [address]    Report Status PENDING  Incomplete     Discharge Instructions:   Discharge Instructions    Call MD for:   Complete by:  As directed    Worsening symptoms   Diet - low sodium heart healthy   Complete by:  As directed    Discharge  instructions   Complete by:  As directed    Follow up with your primary care physician in one week. Follow up with infectious disease physician after antibiotics. Continue your home medications. Follow up with Cardiology in 3-4 weeks   Home infusion instructions [place] May follow [place] Dosing Protocol; May administer Cathflo as needed to maintain patency of vascular access device.; [place] of vascular access device: per [facility] Protocol: 0.9% NaCl pre/post medica...   Complete by:  As directed    Instructions:  May follow [place] Dosing Protocol   Instructions:  May administer Cathflo as needed to maintain patency of vascular access device.   Instructions:  [place] of vascular access device: per [facility] Protocol: 0.9% NaCl pre/post medication administration and prn patency; Heparin 100 u/ml, 109m for implanted ports and Heparin 10u/ml, 573mfor all other central venous catheters.   Instructions:  May follow [facility] Anaphylaxis Protocol for First Dose Administration in the home: 0.9% NaCl at 25-50 ml/hr to maintain IV access for protocol meds. Epinephrine 0.3 ml IV/IM PRN and Benadryl 25-50 IV/IM PRN s/s of anaphylaxis.   Instructions:  [facility] Coordinator (RN) to assist per patient IV care needs in the home PRN.   Increase activity slowly   Complete by:  As directed      Allergies as of 06/21/2018      Reactions   Amlodipine Swelling, Other (See Comments)    Unspecified swelling reaction   Amoxicillin Diarrhea, Nausea And Vomiting, Other (See Comments)   Has patient had a PCN reaction causing immediate rash, facial/tongue/throat swelling, SOB or lightheadedness with hypotension: No Has patient had a PCN reaction causing severe rash involving mucus membranes or skin necrosis: No Has patient had a PCN reaction that required hospitalization: No Has patient had a PCN reaction occurring within the last 10 years: No If all of the above answers are "NO", then may  proceed with Cephalosporin use.   Carafate [sucralfate] Swelling, Other (See Comments)   Reaction:  Knee swelling/redness    Cephalexin Diarrhea   Codeine Nausea And Vomiting   Irbesartan Swelling, Other (See Comments)   Reaction:  Facial/hand swelling and numbness    Morphine And Related Other (See Comments)   Pt states that she has a history of addiction with this medication.       Neurontin [gabapentin] Swelling, Other (See Comments)   Reaction:  Leg swelling    Nitrofurantoin Monohyd Macro Diarrhea, Nausea And Vomiting   Prednisone Other (See Comments)   Reaction:  Elevated BP   Sulfa Antibiotics Rash      Medication List    TAKE these medications   acetaminophen 325 MG tablet Commonly known as:  TYLENOL Take 650 mg by mouth daily as needed for mild pain.   atorvastatin 20 MG tablet Commonly known as:  LIPITOR Take 20 mg by mouth daily.   busPIRone 15 MG tablet Commonly known as:  BUSPAR Take 1 tablet (15 mg total) by mouth 2 (two) times daily.   cefTRIAXone  IVPB Commonly known as:  ROCEPHIN Inject 2  g into the vein daily. Indication:  Group G streptococcal bacteremia Last Day of Therapy:  06/29/18 Labs - Once weekly:  CBC/D and BMP, Labs - Every other week:  ESR and CRP   cycloSPORINE 0.05 % ophthalmic emulsion Commonly known as:  RESTASIS Place 1 drop into both eyes 2 (two) times daily.   diltiazem 120 MG tablet Commonly known as:  CARDIZEM Take 120 mg by mouth 2 (two) times daily.   HYDROcodone-acetaminophen 5-325 MG tablet Commonly known as:  NORCO/VICODIN Take 1 tablet by mouth every 6 (six) hours as needed for severe pain.   ICaps Areds 2 Caps Take 1 capsule by mouth 2 (two) times daily.   isosorbide mononitrate 30 MG 24 hr tablet Commonly known as:  IMDUR Take 0.5 tablets (15 mg total) by mouth daily.   lisinopril 20 MG tablet Commonly known as:  PRINIVIL,ZESTRIL Take 20 mg by mouth 2 (two) times daily.   nitroGLYCERIN 0.4 MG SL  tablet Commonly known as:  NITROSTAT Place 1 tablet (0.4 mg total) under the tongue every 5 (five) minutes as needed for chest pain.   ondansetron 8 MG tablet Commonly known as:  Zofran Take 1 tablet (8 mg total) by mouth 2 (two) times daily. What changed:    when to take this  reasons to take this   oxybutynin 5 MG tablet Commonly known as:  DITROPAN Take 5 mg by mouth daily.   pantoprazole 20 MG tablet Commonly known as:  Protonix Take 1 tablet (20 mg total) by mouth daily.   REFRESH OP Place 1 drop into both eyes 2 (two) times daily.   torsemide 20 MG tablet Commonly known as:  DEMADEX Take 20 mg by mouth daily as needed (retaining fluid).   warfarin 5 MG tablet Commonly known as:  COUMADIN Take as directed. If you are unsure how to take this medication, talk to your nurse or doctor. Original instructions:  Take as directed by [facility]mad[facility] Clinic What changed:    how much to take  how to take this  additional instructions            Home Infusion Instuctions  (From admission, onward)         Start     Ordered   06/20/18 0000  Home infusion instructions [facility] May follow [place] Dosing Protocol; May administer Cathflo as needed to maintain patency of vascular access device.; [place] of vascular access device: per [facility] Protocol: 0.9% NaCl pre/post medica...    Question Answer Comment  Instructions May follow [place] Dosing Protocol   Instructions May administer Cathflo as needed to maintain patency of vascular access device.   Instructions [place] of vascular access device: per [facility] Protocol: 0.9% NaCl pre/post medication administration and prn patency; Heparin 100 u/ml, 20m for implanted ports and Heparin 10u/ml, 567mfor all other central venous catheters.   Instructions May follow [facility] Anaphylaxis Protocol for First Dose Administration in the home: 0.9% NaCl at 25-50 ml/hr to maintain IV access for protocol meds. Epinephrine 0.3 ml IV/IM PRN  and Benadryl 25-50 IV/IM PRN s/s of anaphylaxis.   Instructions [facility] Infusion Coordinator (RN) to assist per patient IV care needs in the home PRN.      06/20/18 1644         Follow-up Information    HaDaune PerchNP Follow up.   Specialty:  Cardiology Why:  You have a hospital follow-up scheduled for 07/13/2018 at 10:00am with NiPecolia Adesone of  our office's NPs. Please arrive 15 minutes early for check-in. Contact information: [address] [place] [address] [phone]        Campbell Riches, MD. Schedule an appointment as soon as possible for a visit in 2 week(s).   Specialty:  Infectious Diseases Why:  follow up after antibiotics. Contact information: [address] [address] [place] [address] [phone]        Nahser, Wonda Cheng, MD. Schedule an appointment as soon as possible for a visit in 3 day(s).   Specialty:  Cardiology Why:  for heart followup Contact information: [place] [address] [phone]            Time coordinating discharge: 39 minutes  Signed:  Wayman Hoard  [facility] 06/21/2018, 10:29 AM

## 2018-06-21 NOTE — Care Management Important Message (Signed)
Important Message  Patient Details  Name: RUHAMA LEHEW MRN: [id number] Date of Birth: [birth year]   Medicare Important Message Given:  Yes    Kerin Salen 06/21/2018, 10:53 AMImportant Message  Patient Details  Name: SHANAN FITZPATRICK MRN: [id number] Date of Birth: Apr 21, 1927   Medicare Important Message Given:  Yes    Kerin Salen 06/21/2018, 10:53 AM

## 2018-06-21 NOTE — TOC Transition Note (Addendum)
Transition of Care [facility]) - CM/SW Discharge Note   Patient Details  Name: Brittany [name] MRN: [id number] Date of Birth: [birth year]  Transition of Care [facility]) CM/SW Contact:  Lia Hopping, [place] Phone Number: 06/21/2018, 10:51 AM   Clinical Narrative:    [facility] will resume [facility] [employer] [facility].   OUTPATIENT  PARENTERAL ANTIBIOTIC THERAPY (OPAT)  Indication: Group G streptococcal bacteremia Regimen: Ceftriaxone 2g IV q24h End date: 06/29/18 [facility] will provide IV infusions.   Final next level of care: [place] [facility]) Barriers to Discharge: No Barriers Identified   Patient Goals and CMS Choice Patient states their goals for this hospitalization and ongoing recovery are:: to recover at home.    Choice offered to / list presented to : Adult Children, Spouse  Discharge Placement  Home-[facility].                      Discharge Plan and Services Discharge Planning Services: CM Consult                HH Arranged: RN [facility] Agency: [facility] ([facility])   Social Determinants of Health (SDOH) Interventions     Readmission Risk Interventions Readmission Risk Prevention Plan 06/21/2018  Transportation Screening Complete  Medication Review Press photographer) Complete  PCP or Specialist appointment within 3-5 days of discharge Complete  HRI or [place] Complete  SW Recovery Care/Counseling Consult Complete  [place] Not Applicable  Some recent data might be hidden

## 2018-06-21 NOTE — Progress Notes (Signed)
Patient c/o chest pain. Rate pain 10/10. Assess, patient  talking and smiling. VS stable. Offered nitro, patient prefer morphine. Morphine given and pain level drop to 7/10. Patient resting quietly in bed at this time.Will cont to monitor.

## 2018-06-22 DIAGNOSIS — I110 Hypertensive heart disease with heart failure: Secondary | ICD-10-CM | POA: Diagnosis not present

## 2018-06-22 DIAGNOSIS — M1991 Primary osteoarthritis, unspecified site: Secondary | ICD-10-CM | POA: Diagnosis not present

## 2018-06-22 DIAGNOSIS — I69393 Ataxia following cerebral infarction: Secondary | ICD-10-CM | POA: Diagnosis not present

## 2018-06-22 DIAGNOSIS — L97529 Non-pressure chronic ulcer of other part of left foot with unspecified severity: Secondary | ICD-10-CM | POA: Diagnosis not present

## 2018-06-22 DIAGNOSIS — I130 Hypertensive heart and chronic kidney disease with heart failure and stage 1 through stage 4 chronic kidney disease, or unspecified chronic kidney disease: Secondary | ICD-10-CM | POA: Diagnosis not present

## 2018-06-22 DIAGNOSIS — I5032 Chronic diastolic (congestive) heart failure: Secondary | ICD-10-CM | POA: Diagnosis not present

## 2018-06-22 DIAGNOSIS — J9601 Acute respiratory failure with hypoxia: Secondary | ICD-10-CM | POA: Diagnosis not present

## 2018-06-22 DIAGNOSIS — J189 Pneumonia, unspecified organism: Secondary | ICD-10-CM | POA: Diagnosis not present

## 2018-06-22 DIAGNOSIS — I509 Heart failure, unspecified: Secondary | ICD-10-CM | POA: Diagnosis not present

## 2018-06-22 DIAGNOSIS — N183 Chronic kidney disease, stage 3 (moderate): Secondary | ICD-10-CM | POA: Diagnosis not present

## 2018-06-22 LAB — CULTURE, BLOOD (ROUTINE X 2)
Culture: NO GROWTH
Culture: NO GROWTH
Special Requests: ADEQUATE
Special Requests: ADEQUATE

## 2018-06-23 ENCOUNTER — Inpatient Hospital Stay (HOSPITAL_COMMUNITY)
Admission: EM | Admit: 2018-06-23 | Discharge: 2018-06-26 | DRG: 291 | Disposition: A | Discharge status: Hospice | Payer: Medicare Other | Attending: Internal Medicine | Admitting: Internal Medicine

## 2018-06-23 ENCOUNTER — Other Ambulatory Visit (HOSPITAL_COMMUNITY): Payer: Medicare Other

## 2018-06-23 ENCOUNTER — Other Ambulatory Visit: Payer: Self-pay

## 2018-06-23 ENCOUNTER — Encounter (HOSPITAL_COMMUNITY): Payer: Self-pay

## 2018-06-23 ENCOUNTER — Emergency Department (HOSPITAL_COMMUNITY): Payer: Medicare Other

## 2018-06-23 DIAGNOSIS — I5033 Acute on chronic diastolic (congestive) heart failure: Secondary | ICD-10-CM | POA: Diagnosis present

## 2018-06-23 DIAGNOSIS — K279 Peptic ulcer, site unspecified, unspecified as acute or chronic, without hemorrhage or perforation: Secondary | ICD-10-CM | POA: Diagnosis present

## 2018-06-23 DIAGNOSIS — I2510 Atherosclerotic heart disease of native coronary artery without angina pectoris: Secondary | ICD-10-CM | POA: Diagnosis present

## 2018-06-23 DIAGNOSIS — R531 Weakness: Secondary | ICD-10-CM

## 2018-06-23 DIAGNOSIS — L03116 Cellulitis of left lower limb: Secondary | ICD-10-CM | POA: Diagnosis present

## 2018-06-23 DIAGNOSIS — E871 Hypo-osmolality and hyponatremia: Secondary | ICD-10-CM | POA: Diagnosis present

## 2018-06-23 DIAGNOSIS — R0689 Other abnormalities of breathing: Secondary | ICD-10-CM | POA: Diagnosis not present

## 2018-06-23 DIAGNOSIS — E785 Hyperlipidemia, unspecified: Secondary | ICD-10-CM | POA: Diagnosis present

## 2018-06-23 DIAGNOSIS — Z961 Presence of intraocular lens: Secondary | ICD-10-CM | POA: Diagnosis present

## 2018-06-23 DIAGNOSIS — I272 Pulmonary hypertension, unspecified: Secondary | ICD-10-CM | POA: Diagnosis present

## 2018-06-23 DIAGNOSIS — R7881 Bacteremia: Secondary | ICD-10-CM | POA: Diagnosis not present

## 2018-06-23 DIAGNOSIS — Z9842 Cataract extraction status, left eye: Secondary | ICD-10-CM

## 2018-06-23 DIAGNOSIS — R0602 Shortness of breath: Secondary | ICD-10-CM | POA: Diagnosis not present

## 2018-06-23 DIAGNOSIS — Z888 Allergy status to other drugs, medicaments and biological substances status: Secondary | ICD-10-CM

## 2018-06-23 DIAGNOSIS — K219 Gastro-esophageal reflux disease without esophagitis: Secondary | ICD-10-CM | POA: Diagnosis present

## 2018-06-23 DIAGNOSIS — K2950 Unspecified chronic gastritis without bleeding: Secondary | ICD-10-CM | POA: Diagnosis present

## 2018-06-23 DIAGNOSIS — Z9841 Cataract extraction status, right eye: Secondary | ICD-10-CM

## 2018-06-23 DIAGNOSIS — I482 Chronic atrial fibrillation, unspecified: Secondary | ICD-10-CM | POA: Diagnosis present

## 2018-06-23 DIAGNOSIS — Z8249 Family history of ischemic heart disease and other diseases of the circulatory system: Secondary | ICD-10-CM

## 2018-06-23 DIAGNOSIS — Z79899 Other long term (current) drug therapy: Secondary | ICD-10-CM

## 2018-06-23 DIAGNOSIS — Z87891 Personal history of nicotine dependence: Secondary | ICD-10-CM

## 2018-06-23 DIAGNOSIS — B954 Other streptococcus as the cause of diseases classified elsewhere: Secondary | ICD-10-CM | POA: Diagnosis present

## 2018-06-23 DIAGNOSIS — I509 Heart failure, unspecified: Secondary | ICD-10-CM | POA: Diagnosis not present

## 2018-06-23 DIAGNOSIS — J189 Pneumonia, unspecified organism: Secondary | ICD-10-CM | POA: Diagnosis not present

## 2018-06-23 DIAGNOSIS — M549 Dorsalgia, unspecified: Secondary | ICD-10-CM | POA: Diagnosis present

## 2018-06-23 DIAGNOSIS — B955 Unspecified streptococcus as the cause of diseases classified elsewhere: Secondary | ICD-10-CM | POA: Diagnosis present

## 2018-06-23 DIAGNOSIS — Z974 Presence of external hearing-aid: Secondary | ICD-10-CM

## 2018-06-23 DIAGNOSIS — H9190 Unspecified hearing loss, unspecified ear: Secondary | ICD-10-CM | POA: Diagnosis present

## 2018-06-23 DIAGNOSIS — G8929 Other chronic pain: Secondary | ICD-10-CM | POA: Diagnosis present

## 2018-06-23 DIAGNOSIS — Z66 Do not resuscitate: Secondary | ICD-10-CM | POA: Diagnosis not present

## 2018-06-23 DIAGNOSIS — J9601 Acute respiratory failure with hypoxia: Secondary | ICD-10-CM

## 2018-06-23 DIAGNOSIS — Z8673 Personal history of transient ischemic attack (TIA), and cerebral infarction without residual deficits: Secondary | ICD-10-CM

## 2018-06-23 DIAGNOSIS — H3530 Unspecified macular degeneration: Secondary | ICD-10-CM | POA: Diagnosis present

## 2018-06-23 DIAGNOSIS — L97529 Non-pressure chronic ulcer of other part of left foot with unspecified severity: Secondary | ICD-10-CM | POA: Diagnosis present

## 2018-06-23 DIAGNOSIS — M21372 Foot drop, left foot: Secondary | ICD-10-CM | POA: Diagnosis present

## 2018-06-23 DIAGNOSIS — Z952 Presence of prosthetic heart valve: Secondary | ICD-10-CM

## 2018-06-23 DIAGNOSIS — K7469 Other cirrhosis of liver: Secondary | ICD-10-CM | POA: Diagnosis present

## 2018-06-23 DIAGNOSIS — Z515 Encounter for palliative care: Secondary | ICD-10-CM | POA: Diagnosis not present

## 2018-06-23 DIAGNOSIS — Z882 Allergy status to sulfonamides status: Secondary | ICD-10-CM

## 2018-06-23 DIAGNOSIS — I4891 Unspecified atrial fibrillation: Secondary | ICD-10-CM | POA: Diagnosis present

## 2018-06-23 DIAGNOSIS — I10 Essential (primary) hypertension: Secondary | ICD-10-CM | POA: Diagnosis not present

## 2018-06-23 DIAGNOSIS — J9621 Acute and chronic respiratory failure with hypoxia: Secondary | ICD-10-CM | POA: Diagnosis not present

## 2018-06-23 DIAGNOSIS — R0902 Hypoxemia: Secondary | ICD-10-CM | POA: Diagnosis not present

## 2018-06-23 DIAGNOSIS — Z885 Allergy status to narcotic agent status: Secondary | ICD-10-CM

## 2018-06-23 DIAGNOSIS — J449 Chronic obstructive pulmonary disease, unspecified: Secondary | ICD-10-CM | POA: Diagnosis present

## 2018-06-23 DIAGNOSIS — I110 Hypertensive heart disease with heart failure: Secondary | ICD-10-CM | POA: Diagnosis not present

## 2018-06-23 DIAGNOSIS — J439 Emphysema, unspecified: Secondary | ICD-10-CM | POA: Diagnosis present

## 2018-06-23 DIAGNOSIS — Z7901 Long term (current) use of anticoagulants: Secondary | ICD-10-CM

## 2018-06-23 DIAGNOSIS — Z8711 Personal history of peptic ulcer disease: Secondary | ICD-10-CM

## 2018-06-23 DIAGNOSIS — R0600 Dyspnea, unspecified: Secondary | ICD-10-CM | POA: Diagnosis present

## 2018-06-23 LAB — CBC WITH DIFFERENTIAL/PLATELET
Abs Immature Granulocytes: 0.45 10*3/uL — ABNORMAL HIGH (ref 0.00–0.07)
Basophils Absolute: 0.1 10*3/uL (ref 0.0–0.1)
Basophils Relative: 0 %
Eosinophils Absolute: 0.4 10*3/uL (ref 0.0–0.5)
Eosinophils Relative: 2 %
HCT: 30 % — ABNORMAL LOW (ref 36.0–46.0)
HEMOGLOBIN: 9.9 g/dL — AB (ref 12.0–15.0)
Immature Granulocytes: 2 %
Lymphocytes Relative: 8 %
Lymphs Abs: 1.7 10*3/uL (ref 0.7–4.0)
MCH: 29.8 pg (ref 26.0–34.0)
MCHC: 33 g/dL (ref 30.0–36.0)
MCV: 90.4 fL (ref 80.0–100.0)
Monocytes Absolute: 1.5 10*3/uL — ABNORMAL HIGH (ref 0.1–1.0)
Monocytes Relative: 8 %
Neutro Abs: 15.6 10*3/uL — ABNORMAL HIGH (ref 1.7–7.7)
Neutrophils Relative %: 80 %
Platelets: 389 10*3/uL (ref 150–400)
RBC: 3.32 MIL/uL — ABNORMAL LOW (ref 3.87–5.11)
RDW: 14.2 % (ref 11.5–15.5)
WBC: 19.7 10*3/uL — ABNORMAL HIGH (ref 4.0–10.5)
nRBC: 0 % (ref 0.0–0.2)

## 2018-06-23 LAB — BLOOD GAS, ARTERIAL
Acid-Base Excess: 2 mmol/L (ref 0.0–2.0)
Bicarbonate: 24.8 mmol/L (ref 20.0–28.0)
Drawn by: 225631
O2 Saturation: 92.8 %
Patient temperature: 98.7
RATE: 18 resp/min
pCO2 arterial: 33.4 mmHg (ref 32.0–48.0)
pH, Arterial: 7.484 — ABNORMAL HIGH (ref 7.350–7.450)
pO2, Arterial: 64.3 mmHg — ABNORMAL LOW (ref 83.0–108.0)

## 2018-06-23 LAB — I-STAT TROPONIN, ED: Troponin i, poc: 0.01 ng/mL (ref 0.00–0.08)

## 2018-06-23 LAB — RESPIRATORY PANEL BY PCR
Adenovirus: NOT DETECTED
BORDETELLA PERTUSSIS-RVPCR: NOT DETECTED
Chlamydophila pneumoniae: NOT DETECTED
Coronavirus 229E: NOT DETECTED
Coronavirus HKU1: NOT DETECTED
Coronavirus NL63: NOT DETECTED
Coronavirus OC43: NOT DETECTED
Influenza A: NOT DETECTED
Influenza B: NOT DETECTED
Metapneumovirus: NOT DETECTED
Mycoplasma pneumoniae: NOT DETECTED
PARAINFLUENZA VIRUS 4-RVPPCR: NOT DETECTED
Parainfluenza Virus 1: NOT DETECTED
Parainfluenza Virus 2: NOT DETECTED
Parainfluenza Virus 3: NOT DETECTED
RESPIRATORY SYNCYTIAL VIRUS-RVPPCR: NOT DETECTED
Rhinovirus / Enterovirus: NOT DETECTED

## 2018-06-23 LAB — COMPREHENSIVE METABOLIC PANEL
ALT: 26 U/L (ref 0–44)
AST: 32 U/L (ref 15–41)
Albumin: 2.7 g/dL — ABNORMAL LOW (ref 3.5–5.0)
Alkaline Phosphatase: 119 U/L (ref 38–126)
Anion gap: 13 (ref 5–15)
BILIRUBIN TOTAL: 0.6 mg/dL (ref 0.3–1.2)
BUN: 18 mg/dL (ref 8–23)
CO2: 23 mmol/L (ref 22–32)
Calcium: 8.3 mg/dL — ABNORMAL LOW (ref 8.9–10.3)
Chloride: 89 mmol/L — ABNORMAL LOW (ref 98–111)
Creatinine, Ser: 0.71 mg/dL (ref 0.44–1.00)
GFR calc Af Amer: >60 mL/min (ref >60)
GFR calc non Af Amer: >60 mL/min (ref >60)
Glucose, Bld: 144 mg/dL — ABNORMAL HIGH (ref 70–99)
Potassium: 3.9 mmol/L (ref 3.5–5.1)
Sodium: 125 mmol/L — ABNORMAL LOW (ref 135–145)
Total Protein: 7.2 g/dL (ref 6.5–8.1)

## 2018-06-23 LAB — PROTIME-INR
INR: 2.7 — AB (ref 0.8–1.2)
Prothrombin Time: 27.9 seconds — ABNORMAL HIGH (ref 11.4–15.2)

## 2018-06-23 LAB — POCT I-STAT EG7
BICARBONATE: 24.7 mmol/L (ref 20.0–28.0)
Calcium, Ion: 1.06 mmol/L — ABNORMAL LOW (ref 1.15–1.40)
HCT: 30 % — ABNORMAL LOW (ref 36.0–46.0)
Hemoglobin: 10.2 g/dL — ABNORMAL LOW (ref 12.0–15.0)
O2 Saturation: 93 %
PO2 VEN: 68 mmHg — AB (ref 32.0–45.0)
Potassium: 4 mmol/L (ref 3.5–5.1)
Sodium: 124 mmol/L — ABNORMAL LOW (ref 135–145)
TCO2: 26 mmol/L (ref 22–32)
pCO2, Ven: 38.8 mmHg — ABNORMAL LOW (ref 44.0–60.0)
pH, Ven: 7.413 (ref 7.250–7.430)

## 2018-06-23 LAB — LACTIC ACID, PLASMA: Lactic Acid, Venous: 1.1 mmol/L (ref 0.5–1.9)

## 2018-06-23 LAB — BRAIN NATRIURETIC PEPTIDE: B Natriuretic Peptide: 274 pg/mL — ABNORMAL HIGH (ref 0.0–100.0)

## 2018-06-23 LAB — PROCALCITONIN: Procalcitonin: 0.23 ng/mL

## 2018-06-23 MED ORDER — FUROSEMIDE 10 MG/ML IJ SOLN
40.0000 mg | INTRAMUSCULAR | Status: AC
  Administered 2018-06-23: 40 mg via INTRAVENOUS
  Filled 2018-06-23: qty 4

## 2018-06-23 MED ORDER — CYCLOSPORINE 0.05 % OP EMUL
1.0000 [drp] | Freq: Two times a day (BID) | OPHTHALMIC | Status: DC
  Administered 2018-06-23 – 2018-06-26 (×7): 1 [drp] via OPHTHALMIC
  Filled 2018-06-23 (×8): qty 30

## 2018-06-23 MED ORDER — FENTANYL CITRATE (PF) 100 MCG/2ML IJ SOLN
25.0000 ug | Freq: Once | INTRAMUSCULAR | Status: AC
  Administered 2018-06-23: 25 ug via INTRAVENOUS
  Filled 2018-06-23: qty 2

## 2018-06-23 MED ORDER — DILTIAZEM HCL 60 MG PO TABS
120.0000 mg | ORAL_TABLET | Freq: Two times a day (BID) | ORAL | Status: DC
  Administered 2018-06-23 – 2018-06-26 (×6): 120 mg via ORAL
  Filled 2018-06-23 (×7): qty 2

## 2018-06-23 MED ORDER — HYDROCODONE-ACETAMINOPHEN 5-325 MG PO TABS: 1.0000 | ORAL_TABLET | Freq: Four times a day (QID) | ORAL | Status: DC | PRN

## 2018-06-23 MED ORDER — WARFARIN SODIUM 5 MG PO TABS: 5.0000 mg | ORAL_TABLET | Freq: Every day | ORAL | Status: DC

## 2018-06-23 MED ORDER — ACETAMINOPHEN 325 MG PO TABS: 650.0000 mg | ORAL_TABLET | Freq: Every day | ORAL | Status: DC | PRN

## 2018-06-23 MED ORDER — SODIUM CHLORIDE 0.9% FLUSH: 3.0000 mL | INTRAVENOUS | Status: DC | PRN

## 2018-06-23 MED ORDER — LISINOPRIL 20 MG PO TABS
20.0000 mg | ORAL_TABLET | Freq: Two times a day (BID) | ORAL | Status: DC
  Administered 2018-06-23 – 2018-06-26 (×6): 20 mg via ORAL
  Filled 2018-06-23 (×6): qty 1

## 2018-06-23 MED ORDER — SODIUM CHLORIDE 0.9 % IV BOLUS: 500.0000 mL | Freq: Once | INTRAVENOUS | Status: DC

## 2018-06-23 MED ORDER — SODIUM CHLORIDE 0.9 % IV SOLN: 250.0000 mL | INTRAVENOUS | Status: DC | PRN

## 2018-06-23 MED ORDER — ONDANSETRON HCL 4 MG/2ML IJ SOLN
4.0000 mg | Freq: Once | INTRAMUSCULAR | Status: AC
  Administered 2018-06-23: 4 mg via INTRAVENOUS
  Filled 2018-06-23: qty 2

## 2018-06-23 MED ORDER — VANCOMYCIN HCL IN DEXTROSE 1-5 GM/200ML-% IV SOLN
1000.0000 mg | Freq: Once | INTRAVENOUS | Status: AC
  Administered 2018-06-23: 1000 mg via INTRAVENOUS
  Filled 2018-06-23: qty 200

## 2018-06-23 MED ORDER — ATORVASTATIN CALCIUM 20 MG PO TABS
20.0000 mg | ORAL_TABLET | Freq: Every day | ORAL | Status: DC
  Administered 2018-06-23 – 2018-06-26 (×4): 20 mg via ORAL
  Filled 2018-06-23 (×4): qty 1

## 2018-06-23 MED ORDER — CEFTRIAXONE IV (FOR PTA / DISCHARGE USE ONLY): 2.0000 g | INTRAVENOUS | Status: DC

## 2018-06-23 MED ORDER — SODIUM CHLORIDE 0.9 % IV SOLN
2.0000 g | INTRAVENOUS | Status: DC
  Administered 2018-06-23 – 2018-06-26 (×4): 2 g via INTRAVENOUS
  Filled 2018-06-23: qty 20
  Filled 2018-06-23 (×3): qty 2

## 2018-06-23 MED ORDER — NITROGLYCERIN 0.4 MG SL SUBL: 0.4000 mg | SUBLINGUAL_TABLET | SUBLINGUAL | Status: DC | PRN

## 2018-06-23 MED ORDER — BUSPIRONE HCL 5 MG PO TABS
15.0000 mg | ORAL_TABLET | Freq: Two times a day (BID) | ORAL | Status: DC
  Administered 2018-06-23 – 2018-06-26 (×7): 15 mg via ORAL
  Filled 2018-06-23 (×7): qty 3
  Filled 2018-06-23: qty 2

## 2018-06-23 MED ORDER — WARFARIN SODIUM 2.5 MG PO TABS
2.5000 mg | ORAL_TABLET | Freq: Once | ORAL | Status: AC
  Administered 2018-06-23: 2.5 mg via ORAL
  Filled 2018-06-23 (×2): qty 1

## 2018-06-23 MED ORDER — OXYBUTYNIN CHLORIDE 5 MG PO TABS
5.0000 mg | ORAL_TABLET | Freq: Every day | ORAL | Status: DC
  Administered 2018-06-23 – 2018-06-26 (×4): 5 mg via ORAL
  Filled 2018-06-23 (×4): qty 1

## 2018-06-23 MED ORDER — PANTOPRAZOLE SODIUM 20 MG PO TBEC
20.0000 mg | DELAYED_RELEASE_TABLET | Freq: Every day | ORAL | Status: DC
  Administered 2018-06-23: 20 mg via ORAL
  Filled 2018-06-23: qty 1

## 2018-06-23 MED ORDER — ONDANSETRON HCL 4 MG/2ML IJ SOLN
4.0000 mg | Freq: Four times a day (QID) | INTRAMUSCULAR | Status: DC | PRN
  Administered 2018-06-23 – 2018-06-25 (×7): 4 mg via INTRAVENOUS
  Filled 2018-06-23 (×7): qty 2

## 2018-06-23 MED ORDER — SODIUM CHLORIDE 0.9% FLUSH
3.0000 mL | Freq: Two times a day (BID) | INTRAVENOUS | Status: DC
  Administered 2018-06-23 – 2018-06-24 (×3): 3 mL via INTRAVENOUS

## 2018-06-23 MED ORDER — SODIUM CHLORIDE 0.9 % IV SOLN
2.0000 g | Freq: Once | INTRAVENOUS | Status: AC
  Administered 2018-06-23: 2 g via INTRAVENOUS
  Filled 2018-06-23: qty 2

## 2018-06-23 MED ORDER — WARFARIN - PHARMACIST DOSING INPATIENT: Freq: Every day | Status: DC

## 2018-06-23 MED ORDER — FUROSEMIDE 10 MG/ML IJ SOLN
40.0000 mg | Freq: Two times a day (BID) | INTRAMUSCULAR | Status: DC
  Administered 2018-06-23 – 2018-06-26 (×6): 40 mg via INTRAVENOUS
  Filled 2018-06-23 (×6): qty 4

## 2018-06-23 MED ORDER — ONDANSETRON HCL 4 MG/2ML IJ SOLN
4.0000 mg | Freq: Four times a day (QID) | INTRAMUSCULAR | Status: DC | PRN
  Administered 2018-06-23: 4 mg via INTRAVENOUS
  Filled 2018-06-23: qty 2

## 2018-06-23 MED ORDER — ACETAMINOPHEN 325 MG PO TABS
650.0000 mg | ORAL_TABLET | ORAL | Status: DC | PRN
  Administered 2018-06-23 – 2018-06-24 (×2): 650 mg via ORAL
  Filled 2018-06-23 (×2): qty 2

## 2018-06-23 MED ORDER — ALUM & MAG HYDROXIDE-SIMETH 200-200-20 MG/5ML PO SUSP
30.0000 mL | ORAL | Status: DC | PRN
  Administered 2018-06-23 – 2018-06-25 (×4): 30 mL via ORAL
  Filled 2018-06-23 (×4): qty 30

## 2018-06-23 MED ORDER — PANTOPRAZOLE SODIUM 40 MG PO TBEC
40.0000 mg | DELAYED_RELEASE_TABLET | Freq: Two times a day (BID) | ORAL | Status: DC
  Administered 2018-06-23 – 2018-06-26 (×6): 40 mg via ORAL
  Filled 2018-06-23 (×6): qty 1

## 2018-06-23 MED ORDER — ONDANSETRON HCL 4 MG PO TABS
8.0000 mg | ORAL_TABLET | Freq: Three times a day (TID) | ORAL | Status: DC | PRN
  Administered 2018-06-24 – 2018-06-26 (×2): 8 mg via ORAL
  Filled 2018-06-23 (×2): qty 2

## 2018-06-23 MED ORDER — PROSIGHT PO TABS
1.0000 | ORAL_TABLET | Freq: Two times a day (BID) | ORAL | Status: DC
  Administered 2018-06-23 – 2018-06-26 (×7): 1 via ORAL
  Filled 2018-06-23 (×8): qty 1

## 2018-06-23 MED ORDER — SODIUM CHLORIDE 0.9% FLUSH
10.0000 mL | INTRAVENOUS | Status: DC | PRN
  Administered 2018-06-26: 10 mL
  Filled 2018-06-23: qty 40

## 2018-06-23 MED ORDER — ICAPS AREDS 2 PO CAPS: 1.0000 | ORAL_CAPSULE | Freq: Two times a day (BID) | ORAL | Status: DC

## 2018-06-23 MED ORDER — ISOSORBIDE MONONITRATE ER 30 MG PO TB24
15.0000 mg | ORAL_TABLET | Freq: Every day | ORAL | Status: DC
  Administered 2018-06-23 – 2018-06-26 (×4): 15 mg via ORAL
  Filled 2018-06-23 (×4): qty 1

## 2018-06-23 NOTE — Progress Notes (Signed)
[place] for: Warfarin Indication: atrial fibrillation in setting of bioprosthetic AVR  Allergies  Allergen Reactions  . Amlodipine Swelling and Other (See Comments)     Unspecified swelling reaction  . Amoxicillin Diarrhea, Nausea And Vomiting and Other (See Comments)    Has patient had a PCN reaction causing immediate rash, facial/tongue/throat swelling, SOB or lightheadedness with hypotension: No Has patient had a PCN reaction causing severe rash involving mucus membranes or skin necrosis: No Has patient had a PCN reaction that required hospitalization: No Has patient had a PCN reaction occurring within the last 10 years: No If all of the above answers are "NO", then may proceed with Cephalosporin use.  . Carafate [Sucralfate] Swelling and Other (See Comments)    Reaction:  Knee swelling/redness   . Cephalexin Diarrhea  . Codeine Nausea And Vomiting  . Irbesartan Swelling and Other (See Comments)    Reaction:  Facial/hand swelling and numbness   . Morphine And Related Other (See Comments)    Pt states that she has a history of addiction with this medication.      . Neurontin [Gabapentin] Swelling and Other (See Comments)    Reaction:  Leg swelling   . Nitrofurantoin Monohyd Macro Diarrhea and Nausea And Vomiting  . Prednisone Other (See Comments)    Reaction:  Elevated BP  . Sulfa Antibiotics Rash    Patient Measurements:     Vital Signs: Temp: 98.7 F (37.1 C) (03/14 0315) Temp Source: Rectal (03/14 0315) BP: 136/65 (03/14 0904) Pulse Rate: 88 (03/14 0904)  Labs: Recent Labs    06/21/18 0442 06/23/18 0337 06/23/18 0349  HGB 8.3* 9.9* 10.2*  HCT 25.4* 30.0* 30.0*  PLT 259 389  --   LABPROT 21.1* 27.9*  --   INR 1.9* 2.7*  --   CREATININE 0.59 0.71  --     Estimated Creatinine Clearance: 40 mL/min (by C-G formula based on SCr of 0.71 mg/dL).   Medical History: Past Medical History:  Diagnosis Date  . Abnormal liver  diagnostic imaging    suspected cirrhosis  . Arthritis   . Atrial fibrillation ([place])   . Chronic back pain   . Diverticulosis   . Dyslipidemia    takes Lipitor daily  . Dysrhythmia    Atrial Fibrillation  . Foot drop    SINCE 1987  . GERD (gastroesophageal reflux disease)   . H/O hiatal hernia   . Hearing impaired    Bilateral hearing aids  . History of bacterial endocarditis   . History of gastritis   . History of stomach ulcers    many yrs ago  . History of TIA (transient ischemic attack)    2013-- RESIDUAL PERIPHERAL VISION RIGHT EYE--  RESOLVED  . Hypertension   . IC (interstitial cystitis)   . Macular degeneration   . Pulmonary hypertension ([place]) 10/2013   Based on TTE  . S/P aortic valve replacement    2005  . Urine incontinence     Medications:  Scheduled:  . atorvastatin  20 mg Oral Daily  . busPIRone  15 mg Oral BID  . cycloSPORINE  1 drop Both Eyes BID  . diltiazem  120 mg Oral BID  . furosemide  40 mg Intravenous BID  . isosorbide mononitrate  15 mg Oral Daily  . lisinopril  20 mg Oral BID  . multivitamin  1 tablet Oral BID  . oxybutynin  5 mg Oral Daily  . pantoprazole  20 mg  Oral Daily  . sodium chloride flush  3 mL Intravenous Q12H   Infusions:  . sodium chloride    . cefTRIAXone (ROCEPHIN)  IV     PRN: sodium chloride, acetaminophen, acetaminophen, HYDROcodone-acetaminophen, nitroGLYCERIN, ondansetron (ZOFRAN) IV, ondansetron, sodium chloride flush  Assessment: 85 y/o F on chronic warfarin for atrial fibrillation in setting of bioprosthetic AVR, admitted with acute on chronic respiratory failure and likely decompensated heart failure.  Orders received to continue warfarin with pharmacy dosing assistance.  Patient's PTA warfarin dosage was reportedly 5 mg daily except 2.5 mg on Tuesdays and Thursdays, last taken 3/13 at 1800.  Goal of Therapy:  INR 2 - 3   06/23/2018: INR therapeutic (2.7)   Would normally be due for warfarin 5 mg today but  suspect requirements may be lower than usual in setting of HF exacerbation.   Plan:  Warfarin 2.5 mg PO today at 1800 INR daily while inpatient Follow clinical course  Clayburn Pert, PharmD, BCPS [phone] 06/23/2018  10:23 AM

## 2018-06-23 NOTE — H&P (Signed)
HPI  ANDE THERRELL QQP:[id number] DOB: 1927-07-11 DOA: 06/23/2018  PCP: Mayra Neer, Brittany [name]   Chief Complaint: SOB  HPI:  31 CF Cirrhosis COPD emphyesema AAA repair CAD with angin s/p RCA DES 10/2013 Chr Afib Chad>4 on coumadin htn Chr L foot drop 1987 Pud gastrittis AVR in 2005 /coumadin Chr L foot ulcer with care given by wound care--  Seen in hospital 3/5-->3/12 started on Vanc and Cefepime--BC x 2 grew grp G strep--TEE no veg  retruns to hospital 3/13-previously on Lasix but unclear if has been taking at home Had some nausea and increasing shortness of breath and more tachypnea No chest pain, diarrhea, chills, Reiger, fever, dysuria, unilateral weakness, seizure, dark stool, tarry stool, nasal stuffiness She has had a cough but it is nonproductive  Emergency room work-up as below  ED Course: given ceftriaxone Cefepime, Vancomycin--lasix given--placed on Bipap--Resp viral panel done   Review of Systems:   Negative for fever, visual changes, sore throat, rash, new muscle aches, chest pain, SOB, dysuria, bleeding, n/v/abdominal pain.  Past Medical History:  Diagnosis Date  . Abnormal liver diagnostic imaging    suspected cirrhosis  . Arthritis   . Atrial fibrillation ([place])   . Chronic back pain   . Diverticulosis   . Dyslipidemia    takes Lipitor daily  . Dysrhythmia    Atrial Fibrillation  . Foot drop    SINCE 1987  . GERD (gastroesophageal reflux disease)   . H/O hiatal hernia   . Hearing impaired    Bilateral hearing aids  . History of bacterial endocarditis   . History of gastritis   . History of stomach ulcers    many yrs ago  . History of TIA (transient ischemic attack)    2013-- RESIDUAL PERIPHERAL VISION RIGHT EYE--  RESOLVED  . Hypertension   . IC (interstitial cystitis)   . Macular degeneration   . Pulmonary hypertension ([place]) 10/2013   Based on TTE  . S/P aortic valve replacement    2005  . Urine incontinence     Past Surgical  History:  Procedure Laterality Date  . ABDOMINAL HYSTERECTOMY  1967  . AORTIC VALVE REPLACEMENT  09-29-2003    [facility] pericardial tissue valve  . APPENDECTOMY  1933  . BIOPSY  02/01/2018   Procedure: BIOPSY;  Surgeon: Rush Landmark Telford Nab., Brittany [name];  Location: [place];  Service: Gastroenterology;;  . CARDIAC CATHETERIZATION  07-15-2003 DR HELEN PRESTON   MODERATE TO MODERATELY SEVERE CALCIFIC AORTIC STENOSIS/  NORMAL CORONARY ARTERIES  . CARDIOVASCULAR STRESS TEST  01-30-2012  DR NASHER   NORMAL NUCLEAR STUDY/  EF 73%/  NORMAL LVF  . CARPAL TUNNEL RELEASE Bilateral   . CATARACT EXTRACTION W/ INTRAOCULAR LENS  IMPLANT, BILATERAL    . CHOLECYSTECTOMY  1993  . CYSTO WITH HYDRODISTENSION N/A 02/05/2013   Procedure: CYSTOSCOPY/HYDRODISTENSION;  Surgeon: Irine Seal, Brittany [name];  Location[facility];  Service: Urology;  Laterality: N/A;  . CYSTO/ HYDRODISTENTION/ INSTILLATION CLORPACTIN  MULTIPLE  last one 2009  . DILATION AND CURETTAGE OF UTERUS    . ERCP N/A 08/10/2012   Procedure: ENDOSCOPIC RETROGRADE CHOLANGIOPANCREATOGRAPHY (ERCP);  Surgeon: Inda Castle, Brittany [name];  Location: [place];  Service: Gastroenterology;  Laterality: N/A;  . ESOPHAGOGASTRODUODENOSCOPY (EGD) WITH PROPOFOL N/A 08/18/2014   Procedure: ESOPHAGOGASTRODUODENOSCOPY (EGD) WITH PROPOFOL;  Surgeon: Inda Castle, Brittany [name];  Location: [facility];  Service: Endoscopy;  Laterality: N/A;  . ESOPHAGOGASTRODUODENOSCOPY (EGD) WITH PROPOFOL N/A 02/01/2018   Procedure: ESOPHAGOGASTRODUODENOSCOPY (EGD) WITH  PROPOFOL;  Surgeon: Irving Copas., Brittany [name];  Location: [facility];  Service: Gastroenterology;  Laterality: N/A;  . FOREIGN BODY REMOVAL  02/01/2018   Procedure: FOREIGN BODY REMOVAL;  Surgeon: Rush Landmark Telford Nab., Brittany [name];  Location: [place];  Service: Gastroenterology;;  . LEFT HEART CATHETERIZATION WITH CORONARY ANGIOGRAM N/A 10/18/2013   Procedure: LEFT HEART CATHETERIZATION WITH CORONARY ANGIOGRAM;   Surgeon: Jettie Booze, Brittany [name];  Location: [facility] CATH LAB;  Service: Cardiovascular;  Laterality: N/A;  . LUMBAR [place] &  2007  . MACROPLASTIQUE URETHRAL IMPLANTATION  08-27-2009  . MINI-OPEN RIGHT ROTATOR CUFF REPAIR  07-12-2001  . RIGHT SHOULDER ARTHROSCOPY W/ DEBRIDEMENT ROTATOR CUFF AND LABRAL TEAR/ ACROMINOPLASTY/ DISTAL CLAVICLE EXCISION/ CA LIGAMENT RELEASE  10-08-1999  . TEE WITHOUT CARDIOVERSION N/A 06/20/2018   Procedure: TRANSESOPHAGEAL ECHOCARDIOGRAM (TEE);  Surgeon: Jerline Pain, Brittany [name];  Location: [facility];  Service: Cardiovascular;  Laterality: N/A;  . TONSILLECTOMY AND ADENOIDECTOMY    . TRANSTHORACIC ECHOCARDIOGRAM  08-10-2011   MILD LVH/  EF 55-60%/  NORMAL AVR TISSUE/ MILD MR/  MODERATE DILATED LA/  MILD DILATED RA  . TRANSVAGINAL TAPE PROCEDURE  07-30-2002  . VEIN LIGATION  1969   RIGHT LOWER LEG     reports that she quit smoking about 28 years ago. Her smoking use included cigarettes. She started smoking about 69 years ago. She has a 20.00 pack-year smoking history. She has never used smokeless tobacco. She reports that she does not drink alcohol or use drugs. Mobility: independant  Allergies  Allergen Reactions  . Amlodipine Swelling and Other (See Comments)     Unspecified swelling reaction  . Amoxicillin Diarrhea, Nausea And Vomiting and Other (See Comments)    Has patient had a PCN reaction causing immediate rash, facial/tongue/throat swelling, SOB or lightheadedness with hypotension: No Has patient had a PCN reaction causing severe rash involving mucus membranes or skin necrosis: No Has patient had a PCN reaction that required hospitalization: No Has patient had a PCN reaction occurring within the last 10 years: No If all of the above answers are "NO", then may proceed with Cephalosporin use.  . Carafate [Sucralfate] Swelling and Other (See Comments)    Reaction:  Knee swelling/redness   . Cephalexin Diarrhea  . Codeine Nausea And Vomiting  .  Irbesartan Swelling and Other (See Comments)    Reaction:  Facial/hand swelling and numbness   . Morphine And Related Other (See Comments)    Pt states that she has a history of addiction with this medication.      . Neurontin [Gabapentin] Swelling and Other (See Comments)    Reaction:  Leg swelling   . Nitrofurantoin Monohyd Macro Diarrhea and Nausea And Vomiting  . Prednisone Other (See Comments)    Reaction:  Elevated BP  . Sulfa Antibiotics Rash    Family History  Problem Relation Age of Onset  . Heart disease Father   . Stomach cancer Maternal Grandmother   . Esophageal cancer Maternal Grandfather   . Bipolar disorder Son        Committed Suicide  . Coronary artery disease Brother   . Bipolar disorder Brother        commited suiside at 60yo  . Alcohol abuse Sister        sister #1  . Alcohol abuse Sister        sister #2  . Colon cancer Maternal Aunt   . Lung disease Neg Hx      Prior to Admission medications  Medication Sig Start Date End Date Taking? Authorizing Provider  acetaminophen (TYLENOL) 325 MG tablet Take 650 mg by mouth daily as needed for mild pain.    Provider, Historical, Brittany [name]  atorvastatin (LIPITOR) 20 MG tablet Take 20 mg by mouth daily.    Provider, Historical, Brittany [name]  busPIRone (BUSPAR) 15 MG tablet Take 1 tablet (15 mg total) by mouth 2 (two) times daily. 03/16/18   Armbruster, Carlota Raspberry, Brittany [name]  cefTRIAXone (ROCEPHIN) IVPB Inject 2 g into the vein daily. Indication:  Group G streptococcal bacteremia Last Day of Therapy:  06/29/18 Labs - Once weekly:  CBC/D and BMP, Labs - Every other week:  ESR and CRP 06/20/18   Pokhrel, [place], Brittany [name]  cycloSPORINE (RESTASIS) 0.05 % ophthalmic emulsion Place 1 drop into both eyes 2 (two) times daily.    Provider, Historical, Brittany [name]  diltiazem (CARDIZEM) 120 MG tablet Take 120 mg by mouth 2 (two) times daily.    Provider, Historical, Brittany [name]  HYDROcodone-acetaminophen (NORCO/VICODIN) 5-325 MG tablet Take 1 tablet by mouth every 6 (six)  hours as needed for severe pain. 02/02/18   Aline August, Brittany [name]  isosorbide mononitrate (IMDUR) 30 MG 24 hr tablet Take 0.5 tablets (15 mg total) by mouth daily. 06/21/18   Pokhrel, Corrie Mckusick, Brittany [name]  lisinopril (PRINIVIL,ZESTRIL) 20 MG tablet Take 20 mg by mouth 2 (two) times daily.    Provider, Historical, Brittany [name]  Multiple Vitamins-Minerals (ICAPS AREDS 2) CAPS Take 1 capsule by mouth 2 (two) times daily.    Provider, Historical, Brittany [name]  nitroGLYCERIN (NITROSTAT) 0.4 MG SL tablet Place 1 tablet (0.4 mg total) under the tongue every 5 (five) minutes as needed for chest pain. 02/02/18   Aline August, Brittany [name]  ondansetron (ZOFRAN) 8 MG tablet Take 1 tablet (8 mg total) by mouth 2 (two) times daily. Patient taking differently: Take 8 mg by mouth every 8 (eight) hours as needed for nausea or vomiting.  02/22/18   Armbruster, Carlota Raspberry, Brittany [name]  oxybutynin (DITROPAN) 5 MG tablet Take 5 mg by mouth daily. 06/06/18   Provider, Historical, Brittany [name]  pantoprazole (PROTONIX) 20 MG tablet Take 1 tablet (20 mg total) by mouth daily. 04/19/18   Shamleffer, Melanie Crazier, Brittany [name]  Polyvinyl Alcohol-Povidone (REFRESH OP) Place 1 drop into both eyes 2 (two) times daily.    Provider, Historical, Brittany [name]  torsemide (DEMADEX) 20 MG tablet Take 20 mg by mouth daily as needed (retaining fluid).     Provider, Historical, Brittany [name]  warfarin (COUMADIN) 5 MG tablet Take as directed by [facility] 06/21/18   Nahser, Wonda Cheng, Brittany [name]    Physical Exam:  Vitals:   06/23/18 0625 06/23/18 0700  BP: (!) 146/73 (!) 142/68  Pulse: 85 84  Resp: (!) 23 (!) 22  Temp:    SpO2: 92% 94%     EOMI NCAT alert pleasant oriented but sleepy  Now currently on nasal oxygen  Chest is clinically clear no added sound no rales no rhonchi no wheeze no inspiratory crackles  S1-S2 no murmur rub or gallop sinus rhythm on exam  Abdomen is soft nontender nondistended no rebound no guarding  Leg m   I have personally reviewed following labs and imaging studies  Labs:   ABG 7.48,  33, 64  Sodium 125 BUN/creatinine 18/0.7 LFTs normal  BNP 274 troponin 0 0.01 lactic acid 1.1  WBC 19.7 hemoglobin 9.9 platelet 389  Imaging studies:   Chest x-ray showed cardiomegaly with small layering bilateral pleural effusions moderate pulmonary edema superimposed confluent bilateral opacities?  Atelectasis or  effusion superimposed infiltrates difficult to exclude  Medical tests:   EKG independently reviewed: Atrial fibrillation, QRS axis is -30 2-40 no ST-T wave changes rate controlled  Test discussed with performing physician:  S  Decision to obtain old records:   Yes  Review and summation of old records:   Yes extensively  Principal Problem:   Acute on chronic respiratory failure with hypoxia  ([place]) Multifactorial probably secondary to decompensated heart failure Patient was kept on BiPAP transiently in the emergency room and is stabilized is off of BiPAP now Nonetheless I feel stepdown monitoring today is reasonable At home is on Demadex 20 daily-here we will give double that dose in terms of Lasix 40 twice daily IV daily and reassess in the a.m. respiratory status May need change over from Cardizem to beta-blocker last echo showed EF of 60 to 65% with severe dilatation of both atria but no specific vegetations and mild thickening of the aortic prosthesis Her weight on last discharge was 61 kg and prior to this on last admission to the hospital was 56 that combined with her BNP leads me to think this is more heart failure related I will get a procalcitonin continue ceftriaxone alone at this time as I do not fear a pneumonia given clinical picture--- I will keep only on ceftriaxone at this time until further data-I suspect her leukocytosis is a stress response We will get a two-view x-ray in the morning to see if effusions have cleared and did note if there are any concerns for aspiration Continue lisinopril 20 twice daily Active Problems:   Chronic gastritis   H/O  aortic valve replacement See above discussion-has thickening of valve but no overt   Emphysema lung ([facility]) Do not hear wheeze rales or rhonchi however will get x-ray to follow-up note that at home she is not on any inhalers and I do not feel any steroids indicated at this time   Streptococcal bacteremia Recent admission for bacteremia secondary to group C strep with chronic foot wound she will need to complete 14 days of antibiotics 06/29/2018 and report infectious disease PICC line to be pulled CBC C met per protocol   Dyspnea As per above discussion Chronic atrial fibrillation chads score >5 Continue Cardizem 120 twice daily, Coumadin 5 mg daily or as per specialized dosing   Other cirrhosis of liver [facility]) Needs outpatient characterization   Left foot drop Has been cleared previous admission for home with physical therapy may need reevaluation in the near future   COPD with chronic bronchitis ([place]) See above discussions Reflux Protonix 20 daily   Severity of Illness: The appropriate patient status for this patient is INPATIENT. Inpatient status is judged to be reasonable and necessary in order to provide the required intensity of service to ensure the patient's safety. The patient's presenting symptoms, physical exam findings, and initial radiographic and laboratory data in the context of their chronic comorbidities is felt to place them at high risk for further clinical deterioration. Furthermore, it is not anticipated that the patient will be medically stable for discharge from the hospital within 2 midnights of admission. The following factors support the patient status of inpatient.   " The patient's presenting symptoms include sob. " The worrisome physical exam findings include difficulty breathing. " The initial radiographic and laboratory data are worrisome because of  multiple. " The chronic co-morbidities include many.   * I certify that at the point of admission it is my  clinical judgment that the  patient will require inpatient hospital care spanning beyond 2 midnights from the point of admission due to high intensity of service, high risk for further deterioration and high frequency of surveillance required.*     DVT prophylaxis:on coumadin Code Status: dnr confirmed Family Communication: spoke with son Consults called: no    Time spent: 67 minutes  Brittany Rappaport, Brittany [name]  [facility] Direct contact: [phone] --Via [place]  --[email or web address]; password [email or web address]  7PM-7AM contact night coverage as above  06/23/2018, 7:11 AM

## 2018-06-23 NOTE — ED Notes (Signed)
ABG collected by RT

## 2018-06-23 NOTE — ED Notes (Signed)
Bed: RESB Expected date:  Expected time:  Means of arrival:  Comments: 89F Resp Distress

## 2018-06-23 NOTE — Progress Notes (Signed)
A consult was received from an ED physician for Vancomycin, cefepime per pharmacy dosing.  The patient's profile has been reviewed for ht/wt/allergies/indication/available labs.   A one time order has been placed for Vancomycin 1gm iv x1, and Cefepime 2gm iv x1.  Further antibiotics/pharmacy consults should be ordered by admitting physician if indicated.                       Thank you, Nani Skillern Crowford 06/23/2018  3:36 AM

## 2018-06-23 NOTE — ED Provider Notes (Signed)
[facility] DEPT Provider Note   CSN: [id number] Arrival date & time: 06/23/18  0308    History   Chief Complaint Chief Complaint  Patient presents with   Shortness of Breath     LEVEL 5 CAVEAT 2/2 ACUITY OF CONDITION  HPI Brittany [name] is a 85 y.o. female.    85 y/o female with hx of Afib (on chronic coumadin), HTN, pulmonary HTN, AS s/p aortic valve replacement, dCHF (LVEF 60-65%) presents to the ED from home for evaluation of SOB. Patient discharged from the hospital on 06/21/18 after week-long admission for sepsis secondary to L foot wound which grew Strep G. Was supposed to continue IV Rocephin at home. Has been with daughter since discharge who reports the patient was in good spirits in the morning, was up and out of her chair at times. Began to complain of nausea in the afternoon which worsened into the evening. Worsening nausea associated with increased labored breathing. Breathing continued to worsen into the night. Daughter called EMS who found the patient have oxygen saturations of 78% on room air on arrival.  She was placed on CPAP with improvement in oxygenation to 100%.  EMS reports initial diffuse rhonchi.  Began to have expiratory wheezing after continuous use of CPAP.  They did start 1 albuterol breathing treatment in route.  Patient does note some discomfort in her central chest.  She is complaining of persistent nausea.  She has not had any vomiting.  No fevers appreciated since discharge.   Shortness of Breath    Past Medical History:  Diagnosis Date   Abnormal liver diagnostic imaging    suspected cirrhosis   Arthritis    Atrial fibrillation (HCC)    Chronic back pain    Diverticulosis    Dyslipidemia    takes Lipitor daily   Dysrhythmia    Atrial Fibrillation   Foot drop    SINCE 1987   GERD (gastroesophageal reflux disease)    H/O hiatal hernia    Hearing impaired    Bilateral hearing aids   History of  bacterial endocarditis    History of gastritis    History of stomach ulcers    many yrs ago   History of TIA (transient ischemic attack)    2013-- RESIDUAL PERIPHERAL VISION RIGHT EYE--  RESOLVED   Hypertension    IC (interstitial cystitis)    Macular degeneration    Pulmonary hypertension ([place]) 10/2013   Based on TTE   S/P aortic valve replacement    2005   Urine incontinence     Patient Active Problem List   Diagnosis Date Noted   Streptococcal bacteremia    Sepsis secondary to UTI ([place]) 70/62/3762   Acute metabolic encephalopathy    Acute lower UTI    Confusion 05/04/2018   Dementia ([place]) 05/04/2018   Chronic ulcer of left foot ([place]) 05/04/2018   CKD (chronic kidney disease), stage III ([place]) 01/30/2018   H/O hiatal hernia    Allergic rhinitis 06/06/2017   Mediastinal lymphadenopathy 06/06/2017   Encounter for therapeutic drug monitoring 10/04/2016   Colitis 09/29/2016   Abdominal pain 09/29/2016   Diarrhea 09/29/2016   Chronic back pain 09/29/2016   Dyspepsia 04/14/2016   Cough 12/02/2014   Emphysema lung ([place]) 12/02/2014   Oral thrush 12/02/2014   Multiple lung nodules on CT 10/07/2014   Nausea without vomiting 09/30/2014   Hyponatremia 08/27/2014   Nausea & vomiting 08/27/2014   Anemia 02/06/2014   Perioral numbness 10/19/2013  Headache(784.0) 10/12/2013   Chronic diastolic heart failure ([place]) 10/12/2013   Atelectasis 10/12/2013   Pulmonary hypertension ([place]) 10/12/2013   Cephalalgia    Dizziness 10/11/2013   Headache 10/11/2013   S/P AVR (aortic valve replacement) 11/19/2012   Abdominal pain, periumbilic 46/27/0350   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   Calculus of bile duct without mention of cholecystitis or obstruction 08/10/2012   Chronic gastritis 07/16/2012   Nausea alone 05/21/2012   Abdominal pain, epigastric 05/21/2012   Cellulitis of foot 01/01/2012   Dysphagia, unspecified(787.20) 12/26/2011    Esophageal reflux 12/26/2011   Dyspnea on exertion 11/18/2011   Chronic atrial fibrillation ([place]) 11/07/2011   Chest pain 06/06/2011   Aortic stenosis 03/24/2011   Hypertension 03/24/2011   H/O aortic valve replacement 10/02/2003    Past Surgical History:  Procedure Laterality Date   ABDOMINAL HYSTERECTOMY  1967   AORTIC VALVE REPLACEMENT  09-29-2003    Juana Di­az pericardial tissue valve   APPENDECTOMY  1933   BIOPSY  02/01/2018   Procedure: BIOPSY;  Surgeon: Irving Copas., MD;  Location: [facility];  Service: Gastroenterology;;   CARDIAC CATHETERIZATION  07-15-2003 DR HELEN PRESTON   MODERATE TO MODERATELY SEVERE CALCIFIC AORTIC STENOSIS/  NORMAL CORONARY ARTERIES   CARDIOVASCULAR STRESS TEST  01-30-2012  DR NASHER   NORMAL NUCLEAR STUDY/  EF 73%/  NORMAL LVF   CARPAL TUNNEL RELEASE Bilateral    CATARACT EXTRACTION W/ INTRAOCULAR LENS  IMPLANT, BILATERAL     CHOLECYSTECTOMY  1993   CYSTO WITH HYDRODISTENSION N/A 02/05/2013   Procedure: CYSTOSCOPY/HYDRODISTENSION;  Surgeon: Irine Seal, MD;  Location: [facility];  Service: Urology;  Laterality: N/A;   CYSTO/ HYDRODISTENTION/ INSTILLATION CLORPACTIN  MULTIPLE  last one 2009   DILATION AND CURETTAGE OF UTERUS     ERCP N/A 08/10/2012   Procedure: ENDOSCOPIC RETROGRADE CHOLANGIOPANCREATOGRAPHY (ERCP);  Surgeon: Inda Castle, MD;  Location: [facility];  Service: Gastroenterology;  Laterality: N/A;   ESOPHAGOGASTRODUODENOSCOPY (EGD) WITH PROPOFOL N/A 08/18/2014   Procedure: ESOPHAGOGASTRODUODENOSCOPY (EGD) WITH PROPOFOL;  Surgeon: Inda Castle, MD;  Location: [facility];  Service: Endoscopy;  Laterality: N/A;   ESOPHAGOGASTRODUODENOSCOPY (EGD) WITH PROPOFOL N/A 02/01/2018   Procedure: ESOPHAGOGASTRODUODENOSCOPY (EGD) WITH PROPOFOL;  Surgeon: Rush Landmark Telford Nab., MD;  Location: [facility];  Service: Gastroenterology;  Laterality: N/A;   FOREIGN BODY REMOVAL  02/01/2018    Procedure: FOREIGN BODY REMOVAL;  Surgeon: Rush Landmark Telford Nab., MD;  Location: [place] [facility];  Service: Gastroenterology;;   LEFT HEART CATHETERIZATION WITH CORONARY ANGIOGRAM N/A 10/18/2013   Procedure: LEFT HEART CATHETERIZATION WITH CORONARY ANGIOGRAM;  Surgeon: Jettie Booze, MD;  Location: [facility] CATH LAB;  Service: Cardiovascular;  Laterality: N/A;   [place]  2007   MACROPLASTIQUE URETHRAL IMPLANTATION  08-27-2009   MINI-OPEN RIGHT ROTATOR CUFF REPAIR  07-12-2001   RIGHT SHOULDER ARTHROSCOPY W/ DEBRIDEMENT ROTATOR CUFF AND LABRAL TEAR/ ACROMINOPLASTY/ DISTAL CLAVICLE EXCISION/ CA LIGAMENT RELEASE  10-08-1999   TEE WITHOUT CARDIOVERSION N/A 06/20/2018   Procedure: TRANSESOPHAGEAL ECHOCARDIOGRAM (TEE);  Surgeon: Jerline Pain, MD;  Location: [facility] ENDOSCOPY;  Service: Cardiovascular;  Laterality: N/A;   TONSILLECTOMY AND ADENOIDECTOMY     TRANSTHORACIC ECHOCARDIOGRAM  08-10-2011   MILD LVH/  EF 55-60%/  NORMAL AVR TISSUE/ MILD MR/  MODERATE DILATED LA/  MILD DILATED RA   TRANSVAGINAL TAPE PROCEDURE  07-30-2002   VEIN LIGATION  1969   RIGHT LOWER LEG     OB History   No obstetric history on file.  Home Medications    Prior to Admission medications   Medication Sig Start Date End Date Taking? Authorizing Provider  acetaminophen (TYLENOL) 325 MG tablet Take 650 mg by mouth daily as needed for mild pain.    [provider]  atorvastatin (LIPITOR) 20 MG tablet Take 20 mg by mouth daily.    [provider]  busPIRone (BUSPAR) 15 MG tablet Take 1 tablet (15 mg total) by mouth 2 (two) times daily. 03/16/18   Armbruster, Carlota Raspberry, MD  cefTRIAXone (ROCEPHIN) IVPB Inject 2 g into the vein daily. Indication:  Group G streptococcal bacteremia Last Day of Therapy:  06/29/18 Labs - Once weekly:  CBC/D and BMP, Labs - Every other week:  ESR and CRP 06/20/18   Pokhrel, Laxman, MD  cycloSPORINE (RESTASIS) 0.05 % ophthalmic emulsion Place 1 drop  into both eyes 2 (two) times daily.    [provider]  diltiazem (CARDIZEM) 120 MG tablet Take 120 mg by mouth 2 (two) times daily.    [provider]  HYDROcodone-acetaminophen (NORCO/VICODIN) 5-325 MG tablet Take 1 tablet by mouth every 6 (six) hours as needed for severe pain. 02/02/18   Aline August, MD  isosorbide mononitrate (IMDUR) 30 MG 24 hr tablet Take 0.5 tablets (15 mg total) by mouth daily. 06/21/18   Pokhrel, Corrie Mckusick, MD  lisinopril (PRINIVIL,ZESTRIL) 20 MG tablet Take 20 mg by mouth 2 (two) times daily.    [provider]  Multiple Vitamins-Minerals (ICAPS AREDS 2) CAPS Take 1 capsule by mouth 2 (two) times daily.    [provider]  nitroGLYCERIN (NITROSTAT) 0.4 MG SL tablet Place 1 tablet (0.4 mg total) under the tongue every 5 (five) minutes as needed for chest pain. 02/02/18   Aline August, MD  ondansetron (ZOFRAN) 8 MG tablet Take 1 tablet (8 mg total) by mouth 2 (two) times daily. Patient taking differently: Take 8 mg by mouth every 8 (eight) hours as needed for nausea or vomiting.  02/22/18   Armbruster, Carlota Raspberry, MD  oxybutynin (DITROPAN) 5 MG tablet Take 5 mg by mouth daily. 06/06/18   [provider]  pantoprazole (PROTONIX) 20 MG tablet Take 1 tablet (20 mg total) by mouth daily. 04/19/18   Shamleffer, Melanie Crazier, MD  Polyvinyl Alcohol-Povidone (REFRESH OP) Place 1 drop into both eyes 2 (two) times daily.    [provider]  torsemide (DEMADEX) 20 MG tablet Take 20 mg by mouth daily as needed (retaining fluid).     [provider]  warfarin (COUMADIN) 5 MG tablet Take as directed by [facility] 06/21/18   Nahser, Wonda Cheng, MD    Family History Family History  Problem Relation Age of Onset   Heart disease Father    Stomach cancer Maternal Grandmother    Esophageal cancer Maternal Grandfather    Bipolar disorder Son        Committed Suicide   Coronary artery disease Brother    Bipolar disorder  Brother        commited suiside at 72yo   Alcohol abuse Sister        sister #1   Alcohol abuse Sister        sister #2   Colon cancer Maternal Aunt    Lung disease Neg Hx     Social History Social History   Tobacco Use   Smoking status: Former Smoker    Packs/day: 0.50    Years: 40.00    Pack years: 20.00    Types: Cigarettes  Start date: 05/06/1949    Last attempt to quit: 03/06/1990    Years since quitting: 28.3   Smokeless tobacco: Never Used  Substance Use Topics   Alcohol use: No    Alcohol/week: 0.0 standard drinks    Comment: Remote social EtOH   Drug use: No     Allergies   Amlodipine; Amoxicillin; Carafate [sucralfate]; Cephalexin; Codeine; Irbesartan; Morphine and related; Neurontin [gabapentin]; Nitrofurantoin monohyd macro; Prednisone; and Sulfa antibiotics   Review of Systems Review of Systems  Unable to perform ROS: Acuity of condition  Respiratory: Positive for shortness of breath.      Physical Exam Updated Vital Signs BP (!) 159/77 (BP Location: Left Arm)    Pulse 87    Temp 98.7 F (37.1 C) (Rectal)    Resp (!) 24    SpO2 99%   Physical Exam Vitals signs and nursing note reviewed.  Constitutional:      General: She is not in acute distress.    Appearance: She is well-developed. She is not diaphoretic.     Comments: Patient alert on BiPAP  HENT:     Head: Normocephalic and atraumatic.  Eyes:     General: No scleral icterus.    Conjunctiva/sclera: Conjunctivae normal.  Neck:     Musculoskeletal: Normal range of motion.  Cardiovascular:     Rate and Rhythm: Normal rate. Rhythm irregularly irregular.     Pulses: Normal pulses.  Pulmonary:     Effort: Tachypnea and accessory muscle usage (minimal) present.     Breath sounds: Examination of the right-upper field reveals wheezing. Examination of the left-upper field reveals wheezing. Examination of the right-lower field reveals decreased breath sounds. Examination of the left-lower  field reveals decreased breath sounds. Decreased breath sounds and wheezing (expiratory) present.  Abdominal:     General: There is no distension.     Palpations: Abdomen is soft.  Musculoskeletal: Normal range of motion.     Comments: Mild edema to LLE with associated erythema to the lateral LLE; regression noted from previous outlined area. No lymphangitic streaking.  Skin:    General: Skin is warm and dry.     Coloration: Skin is not pale.     Findings: No erythema or rash.  Neurological:     Mental Status: She is alert and oriented to person, place, and time.     Coordination: Coordination normal.     Comments: Moving extremities spontaneously.  Psychiatric:        Behavior: Behavior normal.      ED Treatments / Results  Labs (all labs ordered are listed, but only abnormal results are displayed) Labs Reviewed  CBC WITH DIFFERENTIAL/PLATELET - Abnormal; Notable for the following components:      Result Value   WBC 19.7 (*)    RBC 3.32 (*)    Hemoglobin 9.9 (*)    HCT 30.0 (*)    Neutro Abs 15.6 (*)    Monocytes Absolute 1.5 (*)    Abs Immature Granulocytes 0.45 (*)    All other components within normal limits  COMPREHENSIVE METABOLIC PANEL - Abnormal; Notable for the following components:   Sodium 125 (*)    Chloride 89 (*)    Glucose, Bld 144 (*)    Calcium 8.3 (*)    Albumin 2.7 (*)    All other components within normal limits  PROTIME-INR - Abnormal; Notable for the following components:   Prothrombin Time 27.9 (*)    INR 2.7 (*)    All  other components within normal limits  BRAIN NATRIURETIC PEPTIDE - Abnormal; Notable for the following components:   B Natriuretic Peptide 274.0 (*)    All other components within normal limits  POCT I-STAT EG7 - Abnormal; Notable for the following components:   pCO2, Ven 38.8 (*)    pO2, Ven 68.0 (*)    Sodium 124 (*)    Calcium, Ion 1.06 (*)    HCT 30.0 (*)    Hemoglobin 10.2 (*)    All other components within normal  limits  CULTURE, BLOOD (ROUTINE X 2)  CULTURE, BLOOD (ROUTINE X 2)  RESPIRATORY PANEL BY PCR  LACTIC ACID, PLASMA  BLOOD GAS, ARTERIAL  I-STAT TROPONIN, ED    EKG EKG Interpretation  Date/Time:  Saturday June 23 2018 03:38:30 EDT Ventricular Rate:  98 PR Interval:    QRS Duration: 99 QT Interval:  373 QTC Calculation: 477 R Axis:   -72 Text Interpretation:  Atrial fibrillation Left anterior fascicular block Anterior infarct, old No significant change since last tracing Confirmed by Duffy Bruce [phone]) on 06/23/2018 3:48:45 AM   Radiology Dg Chest Port 1 View  Result Date: 06/23/2018 CLINICAL DATA:  Initial evaluation for acute shortness of breath. EXAM: PORTABLE CHEST 1 VIEW COMPARISON:  Prior radiograph from 05/18/2018. FINDINGS: Median sternotomy wires with underlying valvular prosthesis noted, stable. Cardiomegaly unchanged. Mediastinal silhouette normal. Aortic atherosclerosis. Right-sided PICC catheter remains in place with tip overlying the cavoatrial junction, stable. Lungs normally inflated. Moderate layering bilateral pleural effusions. Underlying moderate diffuse pulmonary interstitial edema. Associated bibasilar confluent opacities favored to reflect atelectasis and/or edema. No other consolidative opacity. No pneumothorax. No acute osseous finding.  Osteopenia noted. IMPRESSION: 1. Cardiomegaly with small layering bilateral pleural effusions and moderate diffuse pulmonary edema, suggesting CHF. 2. Superimposed confluent bibasilar opacities, favored to reflect atelectasis and/or effusion. Superimposed infiltrates would be difficult to exclude, and could be considered in the correct clinical setting. Electronically Signed   By: Jeannine Boga M.D.   On: 06/23/2018 03:27    TEE Echocardiogram - 06/20/18 IMPRESSIONS  1. The left ventricle has normal systolic function, with an ejection fraction of 60-65%. The cavity size was normal.  2. The right ventricle has normal  systolc function. The cavity was normal. There is no increase in right ventricular wall thickness.  3. Left atrial size was severely dilated.  4. Right atrial size was severely dilated.  5. Moderate mitral regurgitation, PISA 0.7, ERO 0.15, no PV flow reversal.  6. Tricuspid valve regurgitation is moderate-severe.  7. A an Edwards bioprosthesis valve is present in the aortic position. Procedure Date: 2005 Echo findings are consistent with thickening of the aortic prosthesis.  8. Pulmonic valve regurgitation was not assessed by color flow Doppler.  9. There is evidence of moderate plaque in the descending aorta.   Procedures .Critical Care Performed by: Antonietta Breach, PA-C Authorized by: Antonietta Breach, PA-C   Critical care provider statement:    Critical care time (minutes):  45   Critical care was time spent personally by me on the following activities:  Discussions with consultants, evaluation of patient's response to treatment, examination of patient, ordering and performing treatments and interventions, ordering and review of laboratory studies, ordering and review of radiographic studies, pulse oximetry, re-evaluation of patient's condition, obtaining history from patient or surrogate and review of old charts   (including critical care time)  Medications Ordered in ED Medications  vancomycin (VANCOCIN) IVPB 1000 mg/200 mL premix (has no administration in time range)  ceFEPIme (  MAXIPIME) 2 g in sodium chloride 0.9 % 100 mL IVPB (2 g Intravenous New Bag/Given 06/23/18 0458)  furosemide (LASIX) injection 40 mg (has no administration in time range)  ondansetron (ZOFRAN) injection 4 mg (4 mg Intravenous Given 06/23/18 0458)    4:46 AM Dr. Ellender Hose has discussed case with Dr. Graylon Good who suggests RVP, but does not see indication for COVID testing. Family updated.   Initial Impression / Assessment and Plan / ED Course  I have reviewed the triage vital signs and the nursing notes.  Pertinent  labs & imaging results that were available during my care of the patient were reviewed by me and considered in my medical decision making (see chart for details).        85 year old female presents to the emergency department for evaluation of shortness of breath which began more acutely this evening.  Question flash pulmonary edema given chest x-ray findings and history.  Unable to exclude pneumonia given recent hospitalization.  She was given a dose of antibiotics on arrival for coverage of HCAP.  Patient with improved respirations on BiPAP.  No hypoxia in the ED.  Will attempt to transition to supplemental oxygen via nasal cannula.  ABG ordered.  [facility] to admit for further management.   Final Clinical Impressions(s) / ED Diagnoses   Final diagnoses:  Acute respiratory failure with hypoxia ([place])  Acute on chronic congestive heart failure, unspecified heart failure type [facility])    ED Discharge Orders    None       Antonietta Breach, PA-C 06/23/18 [id number]    Duffy Bruce, MD 06/24/18 1249

## 2018-06-23 NOTE — ED Notes (Addendum)
Pt's family were opening EMS bay doors for other family members to come in. Family was advised that this was not acceptable and they needed to check in at registation. Pt's daughter began getting upset and demanding a coronavirus test saying "I have a weakened immune system. I need to know and she needs to know." Family was advised that "At the moment, she does not meets the requirements for a test." The daughter stated, "You aren't testing anyone and it's bull shit." All family members walked to the lobby from the EMS bay."

## 2018-06-23 NOTE — ED Notes (Signed)
Pt continues to sleep at present time.

## 2018-06-23 NOTE — ED Triage Notes (Signed)
Per EMS, patient coming from home with complaints of SOB. Patient was recently seen and treated for urosepsis (discharged on 12th), and is currently on home antibiotics.   EMS placed patient on CPAP because SpO2 was 78% on RA. Patient 100% on CPAP. EMS also administered an albuterol breathing treatment.

## 2018-06-23 NOTE — Progress Notes (Addendum)
Pt seen, currently asleep, appears comfortable, no increased wob/respiratory distress noted.  HR90, rr20-24, spo2 97% on 2lnc.  Bipap not indicated at this time, pt wore it some in the ED.  Bipap rx changed to prn per RT protocol.

## 2018-06-23 NOTE — ED Notes (Signed)
At present time waiting for pharmacy to verify and send meds to be given.

## 2018-06-23 NOTE — ED Notes (Signed)
Pt to be changed to Tele since she has been able to remain off BiPap since this morning

## 2018-06-23 NOTE — ED Notes (Signed)
Sent message to Dr Verlon Au to request pt to be down graded to Tele if possible due to bed situation.

## 2018-06-23 NOTE — ED Triage Notes (Signed)
Pt presents with [facility] for SOB. Pt on CPAP on arrival. Tactile fever noted. Provider at bedside.

## 2018-06-24 ENCOUNTER — Other Ambulatory Visit (HOSPITAL_COMMUNITY): Payer: Medicare Other

## 2018-06-24 ENCOUNTER — Inpatient Hospital Stay (HOSPITAL_COMMUNITY): Payer: Medicare Other

## 2018-06-24 DIAGNOSIS — I509 Heart failure, unspecified: Secondary | ICD-10-CM

## 2018-06-24 DIAGNOSIS — I4891 Unspecified atrial fibrillation: Secondary | ICD-10-CM

## 2018-06-24 LAB — BASIC METABOLIC PANEL
Anion gap: 10 (ref 5–15)
BUN: 17 mg/dL (ref 8–23)
CHLORIDE: 91 mmol/L — AB (ref 98–111)
CO2: 28 mmol/L (ref 22–32)
Calcium: 7.8 mg/dL — ABNORMAL LOW (ref 8.9–10.3)
Creatinine, Ser: 0.64 mg/dL (ref 0.44–1.00)
GFR calc Af Amer: >60 mL/min (ref >60)
GFR calc non Af Amer: >60 mL/min (ref >60)
Glucose, Bld: 93 mg/dL (ref 70–99)
POTASSIUM: 3.6 mmol/L (ref 3.5–5.1)
Sodium: 129 mmol/L — ABNORMAL LOW (ref 135–145)

## 2018-06-24 LAB — TROPONIN I: Troponin I: <0.03 ng/mL (ref <0.03)

## 2018-06-24 LAB — PROTIME-INR
INR: 2.7 — ABNORMAL HIGH (ref 0.8–1.2)
Prothrombin Time: 28.4 seconds — ABNORMAL HIGH (ref 11.4–15.2)

## 2018-06-24 LAB — PROCALCITONIN: Procalcitonin: 0.19 ng/mL

## 2018-06-24 MED ORDER — TRAZODONE HCL 50 MG PO TABS
50.0000 mg | ORAL_TABLET | Freq: Every evening | ORAL | Status: DC | PRN
  Administered 2018-06-24: 50 mg via ORAL
  Filled 2018-06-24: qty 1

## 2018-06-24 MED ORDER — WARFARIN SODIUM 2.5 MG PO TABS
2.5000 mg | ORAL_TABLET | Freq: Once | ORAL | Status: AC
  Administered 2018-06-24: 2.5 mg via ORAL
  Filled 2018-06-24: qty 1

## 2018-06-24 MED ORDER — HYDROCODONE-ACETAMINOPHEN 5-325 MG PO TABS
1.0000 | ORAL_TABLET | Freq: Four times a day (QID) | ORAL | Status: DC | PRN
  Administered 2018-06-24 – 2018-06-26 (×6): 1 via ORAL
  Filled 2018-06-24 (×6): qty 1

## 2018-06-24 MED ORDER — MORPHINE SULFATE (PF) 2 MG/ML IV SOLN
1.0000 mg | Freq: Once | INTRAVENOUS | Status: AC
  Administered 2018-06-24: 1 mg via INTRAVENOUS
  Filled 2018-06-24: qty 1

## 2018-06-24 MED ORDER — MORPHINE SULFATE (PF) 2 MG/ML IV SOLN
1.0000 mg | INTRAVENOUS | Status: DC | PRN
  Administered 2018-06-25 (×3): 1 mg via INTRAVENOUS
  Filled 2018-06-24 (×3): qty 1

## 2018-06-24 NOTE — Progress Notes (Addendum)
PROGRESS NOTE                                                                                                                                                                                                             Patient Demographics:    Brittany [name], is a 85 y.o. female, DOB - 04/24/1927, ZOX:[id number]  Admit date - 06/23/2018   Admitting Physician Nita Sells, MD  Outpatient Primary MD for the patient is Mayra Neer, MD  LOS - 1   Chief Complaint  Patient presents with  . Shortness of Breath       Brief Narrative    85 y.o.femalewithlast medicalhistory of cirrhosis, chronic AFib, chronic back pain, HTN, gastritis/GERD, pulmonary HTN, AVR 2005 on coumadin, and bacterial endocarditis, with recent hospitalization secondary to sepsis from group C streptococcus bacteremia with left foot cellulitis and ulcer, 06/21/2018, patient presents yesterday with complaints of shortness of breath, she required BiPAP initially, her work-up Significant for volume overload, likely has had diuresis has been stopped on recent discharge, she was admitted for further work-up and diuresis.   Subjective:    Brittany [name] today reports generalized weakness, feeling frail, L, still reports dyspnea.  Reports her respiratory status still far from baseline   Assessment  & Plan :    Principal Problem:   Acute on chronic respiratory failure with hypoxia (HCC) Active Problems:   Chronic gastritis   H/O aortic valve replacement   Emphysema lung (HCC)   Streptococcal bacteremia   Dyspnea   Other cirrhosis of liver (HCC)   Left foot drop   COPD with chronic bronchitis (HCC)   Acute hypoxemic respiratory failure (HCC)   Heart failure (HCC)  Acute on chronic hypoxic respiratory failure -Required BiPAP initially, work-up significant for volume overload, most likely as her Demadex has been held on recent discharge, she is currently on room air, but still  has some increased work of breathing, crackles, breathing not back at baseline, continue with IV Lasix 40 mg IV twice daily, daily weight, strict ins and outs, monitor electrolytes closely. -No clinical evidence of pneumonia, so we will need to cover for H CAP.  Acute on chronic diastolic CHF -Echo on April 2019 significant for EF 60 to 65%, continue with IV Lasix in the setting of volume overload  Recent Hospitalization with group C streptococcus bacteremia  with left foot cellulitis and ulcer -Cellulitis and foot ulcer significantly improved, her work-up during recent admission including for negative TEE, continue with IV Rocephin.  Chronic atrial fibrillation:  - Rate  controlled, continued diltiazemand Coumadin   History of brioprosthetic AVR: - Mild stenosis on most recent echo. Continue coumadin with INR goal 2-3,  Pulmonary HTN, emphysema: Patientwill have follow-upwith pulmonology, Dr. Lamonte Sakai 4/14.Currently stable  Left foot drop:s/p orthopedic procedure 1987.   Hyponatremia: -Secondary to volume overload with sodium 125 on presentation, continue with IV diuresis  Dyslipidemia:  - Continue statin  GERD:  - PPI  Addendum: And this complains of shortness of breath, she is not hypoxic, no increased work of breathing, patient reports she is interested in palliative, have discussed with her morphine as needed, son and daughter-in-law at bedside, for now decided to continue with morphine 1 mg IV every 4 as needed for dyspnea or pain, palliative medicine to be consulted to explore her options. - she did report some chest pain, left-sided, was reproducible, I will obtain EKG and troponin.  Code Status : DNR  Family Communication  : none at bedside  Disposition Plan  : home when stable  Barriers For Discharge : [place] status not back to baseline, still requiring IV diuresis, sodium at 129  Consults  :  None  Procedures  : None  DVT Prophylaxis  :  On Warfarin    Lab Results  Component Value Date   PLT 389 06/23/2018    Antibiotics  :    Anti-infectives (From admission, onward)   Start     Dose/Rate Route Frequency Ordered Stop   06/23/18 1200  cefTRIAXone (ROCEPHIN) 2 g in sodium chloride 0.9 % 100 mL IVPB     2 g 200 mL/hr over 30 Minutes Intravenous Every 24 hours 06/23/18 0946 06/30/18 1159   06/23/18 0945  cefTRIAXone (ROCEPHIN) IVPB  Status:  Discontinued    Note to Pharmacy:  Indication:  Group G streptococcal bacteremia Last Day of Therapy:  06/29/18 Labs - Once weekly:  CBC/D and BMP, Labs - Every other week:  ESR and CRP     2 g Intravenous Every 24 hours 06/23/18 0937 06/23/18 0945   06/23/18 0345  vancomycin (VANCOCIN) IVPB 1000 mg/200 mL premix     1,000 mg 200 mL/hr over 60 Minutes Intravenous  Once 06/23/18 0334 06/23/18 0642   06/23/18 0345  ceFEPIme (MAXIPIME) 2 g in sodium chloride 0.9 % 100 mL IVPB     2 g 200 mL/hr over 30 Minutes Intravenous  Once 06/23/18 0334 06/23/18 0528        Objective:   Vitals:   06/23/18 1907 06/23/18 2217 06/23/18 2218 06/24/18 0517  BP:  128/72 128/71 (!) 144/66  Pulse:   90 79  Resp:   14 16  Temp:   99.3 F (37.4 C) 98.4 F (36.9 C)  TempSrc:   Oral Oral  SpO2:   98% 95%  Weight: 61.4 kg   58.4 kg  Height: 5' 1" (1.549 m)       Wt Readings from Last 3 Encounters:  06/24/18 58.4 kg  06/21/18 61.3 kg  05/04/18 56.7 kg     Intake/Output Summary (Last 24 hours) at 06/24/2018 1337 Last data filed at 06/24/2018 2330 Gross per 24 hour  Intake -  Output 3000 ml  Net -3000 ml     Physical Exam  Awake Alert, Oriented X 3, frail, currently ill-appearing  Symmetrical Chest wall movement, diminished air entry  at the bases, some crackles, creased use of respiratory muscles RRR,No Gallops,Rubs or new Murmurs, No Parasternal Heave +ve B.Sounds, Abd Soft, No tenderness,  No rebound - guarding or rigidity. No Cyanosis, lower extremity cellulitis has improved    Data Review:     CBC Recent Labs  Lab 06/18/18 0316 06/19/18 0831 06/20/18 0403 06/21/18 0442 06/23/18 0337 06/23/18 0349  WBC 13.8* 11.6* 10.5 10.2 19.7*  --   HGB 9.0* 9.3* 8.4* 8.3* 9.9* 10.2*  HCT 28.4* 28.4* 26.1* 25.4* 30.0* 30.0*  PLT 162 157 156 259 389  --   MCV 92.2 90.2 90.3 90.4 90.4  --   MCH 29.2 29.5 29.1 29.5 29.8  --   MCHC 31.7 32.7 32.2 32.7 33.0  --   RDW 13.6 13.6 13.5 13.7 14.2  --   LYMPHSABS 1.5  --   --   --  1.7  --   MONOABS 1.5*  --   --   --  1.5*  --   EOSABS 0.5  --   --   --  0.4  --   BASOSABS 0.0  --   --   --  0.1  --     Chemistries  Recent Labs  Lab 06/19/18 0831 06/20/18 0403 06/21/18 0442 06/23/18 0337 06/23/18 0349 06/24/18 0422  NA 130* 129* 132* 125* 124* 129*  K 4.2 4.0 4.2 3.9 4.0 3.6  CL 96* 92* 94* 89*  --  91*  CO2 _0 --  28  GLUCOSE 107* 131* 107* 144*  --  93  BUN _1 --  17  CREATININE 0.55 0.72 0.59 0.71  --  0.64  CALCIUM 8.1* 7.9* 8.1* 8.3*  --  7.8*  AST  --   --   --  32  --   --   ALT  --   --   --  26  --   --   ALKPHOS  --   --   --  119  --   --   BILITOT  --   --   --  0.6  --   --    ------------------------------------------------------------------------------------------------------------------ No results for input(s): CHOL, HDL, LDLCALC, TRIG, CHOLHDL, LDLDIRECT in the last 72 hours.  No results found for: HGBA1C ------------------------------------------------------------------------------------------------------------------ No results for input(s): TSH, T4TOTAL, T3FREE, THYROIDAB in the last 72 hours.  Invalid input(s): FREET3 ------------------------------------------------------------------------------------------------------------------ No results for input(s): VITAMINB12, FOLATE, FERRITIN, TIBC, IRON, RETICCTPCT in the last 72 hours.  Coagulation profile Recent Labs  Lab 06/19/18 0336 06/20/18 0403 06/21/18 0442 06/23/18 0337 06/24/18 0422  INR 2.6* 2.3* 1.9* 2.7* 2.7*    No  results for input(s): DDIMER in the last 72 hours.  Cardiac Enzymes Recent Labs  Lab 06/17/18 1900  TROPONINI 0.09*   ------------------------------------------------------------------------------------------------------------------    Component Value Date/Time   BNP 274.0 (H) 06/23/2018 1610    Inpatient Medications  Scheduled Meds: . atorvastatin  20 mg Oral Daily  . busPIRone  15 mg Oral BID  . cycloSPORINE  1 drop Both Eyes BID  . diltiazem  120 mg Oral BID  . furosemide  40 mg Intravenous BID  . isosorbide mononitrate  15 mg Oral Daily  . lisinopril  20 mg Oral BID  . multivitamin  1 tablet Oral BID  . oxybutynin  5 mg Oral Daily  . pantoprazole  40 mg Oral BID  . sodium chloride flush  3 mL Intravenous Q12H  . warfarin  2.5 mg Oral ONCE-1800  . Warfarin - Pharmacist Dosing Inpatient   Does not apply q1800   Continuous Infusions: . sodium chloride    . cefTRIAXone (ROCEPHIN)  IV 2 g (06/24/18 1059)   PRN Meds:.sodium chloride, acetaminophen, alum & mag hydroxide-simeth, HYDROcodone-acetaminophen, nitroGLYCERIN, ondansetron (ZOFRAN) IV, ondansetron, sodium chloride flush, sodium chloride flush  Micro Results Recent Results (from the past 240 hour(s))  MRSA PCR Screening     Status: None   Collection Time: 06/14/18  5:52 PM  Result Value Ref Range Status   MRSA by PCR NEGATIVE NEGATIVE Final    Comment:        The GeneXpert MRSA Assay (FDA approved for NASAL specimens only), is one component of a comprehensive MRSA colonization surveillance program. It is not intended to diagnose MRSA infection nor to guide or monitor treatment for MRSA infections. Performed at [facility], [place] [address] [place], [place] [address]   C difficile quick scan w PCR reflex     Status: None   Collection Time: 06/15/18  5:35 AM  Result Value Ref Range Status   C Diff antigen NEGATIVE NEGATIVE Final   C Diff toxin NEGATIVE NEGATIVE Final   C Diff  interpretation No C. difficile detected.  Final    Comment: Performed at [facility], [place] [address] [place], [place] [address]  Culture, blood (routine x 2)     Status: None   Collection Time: 06/17/18  4:03 AM  Result Value Ref Range Status   Specimen Description   Final    BLOOD LEFT ANTECUBITAL Performed at [facility] [address] [place], [place] [address]    Special Requests   Final    BOTTLES DRAWN AEROBIC AND ANAEROBIC Blood Culture adequate volume Performed at [facility] [address], [place], [place] [address]    Culture   Final    NO GROWTH 5 DAYS Performed at [facility], [place] [address], [place], [place] [address]    Report Status 06/22/2018 FINAL  Final  Culture, blood (routine x 2)     Status: None   Collection Time: 06/17/18  4:03 AM  Result Value Ref Range Status   Specimen Description   Final    BLOOD LEFT ARM Performed at [facility], [place] [address] [place], [place] [address]    Special Requests   Final    BOTTLES DRAWN AEROBIC ONLY Blood Culture adequate volume Performed at [facility] [address] [place], [place] [address]    Culture   Final    NO GROWTH 5 DAYS Performed at [facility], [place] [address] [place], [place] [address]    Report Status 06/22/2018 FINAL  Final  Culture, blood (Routine X 2) w Reflex to ID Panel     Status: None (Preliminary result)   Collection Time: 06/23/18  3:37 AM  Result Value Ref Range Status   Specimen Description   Final    BLOOD RIGHT ARM Performed at [facility], [place] [address], [place], [place] [address]    Special Requests   Final    BOTTLES DRAWN AEROBIC AND ANAEROBIC Blood Culture results may not be optimal due to an excessive volume of blood received in culture bottles Performed at [facility] [address] [place], [place] [address]    Culture PENDING  Incomplete    Report Status PENDING  Incomplete  Respiratory Panel  by PCR     Status: None   Collection Time: 06/23/18  5:43 AM  Result Value Ref Range Status   Adenovirus NOT DETECTED NOT DETECTED Final   Coronavirus 229E NOT DETECTED NOT DETECTED Final    Comment: (NOTE) The Coronavirus on the Respiratory Panel, DOES NOT test for the novel  Coronavirus (2019 nCoV)    Coronavirus HKU1 NOT DETECTED NOT DETECTED Final   Coronavirus NL63 NOT DETECTED NOT DETECTED Final   Coronavirus OC43 NOT DETECTED NOT DETECTED Final   Metapneumovirus NOT DETECTED NOT DETECTED Final   Rhinovirus / Enterovirus NOT DETECTED NOT DETECTED Final   Influenza A NOT DETECTED NOT DETECTED Final   Influenza B NOT DETECTED NOT DETECTED Final   Parainfluenza Virus 1 NOT DETECTED NOT DETECTED Final   Parainfluenza Virus 2 NOT DETECTED NOT DETECTED Final   Parainfluenza Virus 3 NOT DETECTED NOT DETECTED Final   Parainfluenza Virus 4 NOT DETECTED NOT DETECTED Final   Respiratory Syncytial Virus NOT DETECTED NOT DETECTED Final   Bordetella pertussis NOT DETECTED NOT DETECTED Final   Chlamydophila pneumoniae NOT DETECTED NOT DETECTED Final   Mycoplasma pneumoniae NOT DETECTED NOT DETECTED Final    Comment: Performed at [facility], [address], [place], [place] [address]    Radiology Reports Dg Chest 2 View  Result Date: 06/24/2018 CLINICAL DATA:  Shortness of breath, pleural effusions EXAM: CHEST - 2 VIEW COMPARISON:  06/23/2018 FINDINGS: Pulmonary vascular congestion with possible mild interstitial edema, improved. Small bilateral pleural effusions, stable versus mildly decreased. Associated left basilar atelectasis. No pneumothorax. Right arm PICC terminates in the mid SVC. Median sternotomy. IMPRESSION: Cardiomegaly with mild interstitial edema and small bilateral pleural effusions, mildly improved. Electronically Signed   By: Julian Hy M.D.   On: 06/24/2018 08:50   Dg Chest 2 View  Result Date: 06/14/2018  CLINICAL DATA:  Cough for 3 days COMPARISON:  Radiograph 05/08/2018 FINDINGS: Sternotomy wires overlie enlarged cardiac silhouette. Small pleural effusion seen on lateral projection. Upper lungs are clear. No focal consolidation. Atherosclerotic calcification of the aorta. IMPRESSION: Cardiomegaly and bilateral effusions. Findings suggest congestive heart failure Electronically Signed   By: Suzy Bouchard M.D.   On: 06/14/2018 10:39   Dg Lumbar Spine Complete  Result Date: 06/14/2018 CLINICAL DATA:  Post unwitnessed fall with low back pain. EXAM: LUMBAR SPINE - COMPLETE 4+ VIEW COMPARISON:  None. FINDINGS: There is no evidence of lumbar spine fracture. Alignment is normal. Multilevel osteoarthritic changes with disc space narrowing, subchondral sclerosis, osteophyte formation. Calcific atherosclerotic disease of the aorta. IMPRESSION: 1. No acute fracture or dislocation identified about the lumbosacral spine. 2. Multilevel osteoarthritic changes, moderate. Electronically Signed   By: Fidela Salisbury M.D.   On: 06/14/2018 13:32   Dg Chest Port 1 View  Result Date: 06/23/2018 CLINICAL DATA:  Initial evaluation for acute shortness of breath. EXAM: PORTABLE CHEST 1 VIEW COMPARISON:  Prior radiograph from 05/18/2018. FINDINGS: Median sternotomy wires with underlying valvular prosthesis noted, stable. Cardiomegaly unchanged. Mediastinal silhouette normal. Aortic atherosclerosis. Right-sided PICC catheter remains in place with tip overlying the cavoatrial junction, stable. Lungs normally inflated. Moderate layering bilateral pleural effusions. Underlying moderate diffuse pulmonary interstitial edema. Associated bibasilar confluent opacities favored to reflect atelectasis and/or edema. No other consolidative opacity. No pneumothorax. No acute osseous finding.  Osteopenia noted. IMPRESSION: 1. Cardiomegaly with small layering bilateral pleural effusions and moderate diffuse pulmonary edema, suggesting CHF. 2.  Superimposed confluent bibasilar opacities, favored to reflect atelectasis and/or effusion. Superimposed infiltrates would  be difficult to exclude, and could be considered in the correct clinical setting. Electronically Signed   By: Jeannine Boga M.D.   On: 06/23/2018 03:27   Dg Chest Port 1 View  Result Date: 06/16/2018 CLINICAL DATA:  Evaluate PICC line placement. EXAM: PORTABLE CHEST 1 VIEW COMPARISON:  June 14, 2018 FINDINGS: A right PICC line terminates in the central SVC. Bilateral pleural effusions, right greater than left, are more pronounced on this frontal view compared to the previous frontal view, likely due to difference in positioning and layering of the effusions. Cardiomegaly. The hila and mediastinum are normal. No pneumothorax. No nodules or masses. IMPRESSION: 1. The right PICC line is in good position. 2. Bilateral pleural effusions, right greater than left, with under lying atelectasis. Electronically Signed   By: Dorise Bullion III M.D   On: 06/16/2018 18:28   Dg Foot 2 Views Left  Result Date: 06/14/2018 CLINICAL DATA:  Initial evaluation for nonhealing wound at lateral left foot. Recent trauma with fall. EXAM: LEFT FOOT - 2 VIEW COMPARISON:  Prior radiograph from 01/01/2012 FINDINGS: No acute fracture or dislocation. Fixation screws in place at the left third and fourth digits without complication. Scattered osteoarthritic changes present about the foot with underlying osteopenia. Soft tissue swelling overlies the lateral aspect of the base of the left fifth metatarsal, likely related to overlying ulcer/wound. No evidence for acute osteomyelitis. No dissecting soft tissue emphysema or other worrisome feature. Vascular calcifications noted within the lower leg. IMPRESSION: 1. Soft tissue ulceration/wound overlying the base of the left fifth metatarsal. No radiographic evidence for acute osteomyelitis. 2. No other acute osseous abnormality about the left foot. 3. Postoperative  changes at the left third and fourth digits without adverse features. 4. Advanced osteopenia. Electronically Signed   By: Jeannine Boga M.D.   On: 06/14/2018 13:39   [place] Ekg Site Rite  Result Date: 06/16/2018 If Site Rite image not attached, placement could not be confirmed due to current cardiac rhythm.     Phillips Climes M.D on 06/24/2018 at 1:37 PM  Between 7am to 7pm - Pager - [phone]  After 7pm go to [email or web address] - password [email or web address]  [facility] -  Office  [phone]

## 2018-06-24 NOTE — Progress Notes (Signed)
[place] for: Warfarin Indication: atrial fibrillation in setting of bioprosthetic AVR  Allergies  Allergen Reactions  . Amlodipine Swelling and Other (See Comments)     Unspecified swelling reaction  . Amoxicillin Diarrhea, Nausea And Vomiting and Other (See Comments)    Has patient had a PCN reaction causing immediate rash, facial/tongue/throat swelling, SOB or lightheadedness with hypotension: No Has patient had a PCN reaction causing severe rash involving mucus membranes or skin necrosis: No Has patient had a PCN reaction that required hospitalization: No Has patient had a PCN reaction occurring within the last 10 years: No If all of the above answers are "NO", then may proceed with Cephalosporin use.  . Carafate [Sucralfate] Swelling and Other (See Comments)    Reaction:  Knee swelling/redness   . Cephalexin Diarrhea  . Codeine Nausea And Vomiting  . Irbesartan Swelling and Other (See Comments)    Reaction:  Facial/hand swelling and numbness   . Morphine And Related Other (See Comments)    Pt states that she has a history of addiction with this medication.      . Neurontin [Gabapentin] Swelling and Other (See Comments)    Reaction:  Leg swelling   . Nitrofurantoin Monohyd Macro Diarrhea and Nausea And Vomiting  . Prednisone Other (See Comments)    Reaction:  Elevated BP  . Sulfa Antibiotics Rash    Patient Measurements: Height: 5\' 1"  (154.9 cm) Weight: 128 lb 12 oz (58.4 kg) IBW/kg (Calculated) : 47.8   Vital Signs: Temp: 98.4 F (36.9 C) (03/15 0517) Temp Source: Oral (03/15 0517) BP: 144/66 (03/15 0517) Pulse Rate: 79 (03/15 0517)  Labs: Recent Labs    06/23/18 0337 06/23/18 0349 06/24/18 0422  HGB 9.9* 10.2*  --   HCT 30.0* 30.0*  --   PLT 389  --   --   LABPROT 27.9*  --  28.4*  INR 2.7*  --  2.7*  CREATININE 0.71  --  0.64    Estimated Creatinine Clearance: 39.1 mL/min (by C-G formula based on SCr of 0.64  mg/dL).   Medical History: Past Medical History:  Diagnosis Date  . Abnormal liver diagnostic imaging    suspected cirrhosis  . Arthritis   . Atrial fibrillation ([place])   . Chronic back pain   . Diverticulosis   . Dyslipidemia    takes Lipitor daily  . Dysrhythmia    Atrial Fibrillation  . Foot drop    SINCE 1987  . GERD (gastroesophageal reflux disease)   . H/O hiatal hernia   . Hearing impaired    Bilateral hearing aids  . History of bacterial endocarditis   . History of gastritis   . History of stomach ulcers    many yrs ago  . History of TIA (transient ischemic attack)    2013-- RESIDUAL PERIPHERAL VISION RIGHT EYE--  RESOLVED  . Hypertension   . IC (interstitial cystitis)   . Macular degeneration   . Pulmonary hypertension ([place]) 10/2013   Based on TTE  . S/P aortic valve replacement    2005  . Urine incontinence     Medications:  Scheduled:  . atorvastatin  20 mg Oral Daily  . busPIRone  15 mg Oral BID  . cycloSPORINE  1 drop Both Eyes BID  . diltiazem  120 mg Oral BID  . furosemide  40 mg Intravenous BID  . isosorbide mononitrate  15 mg Oral Daily  . lisinopril  20 mg Oral BID  .  multivitamin  1 tablet Oral BID  . oxybutynin  5 mg Oral Daily  . pantoprazole  40 mg Oral BID  . sodium chloride flush  3 mL Intravenous Q12H  . Warfarin - Pharmacist Dosing Inpatient   Does not apply q1800   Infusions:  . sodium chloride    . cefTRIAXone (ROCEPHIN)  IV Stopped (06/23/18 1253)   PRN: sodium chloride, acetaminophen, alum & mag hydroxide-simeth, nitroGLYCERIN, ondansetron (ZOFRAN) IV, ondansetron, sodium chloride flush, sodium chloride flush  Assessment: 85 y/o F on chronic warfarin for atrial fibrillation in setting of bioprosthetic AVR, admitted with acute on chronic respiratory failure and likely decompensated heart failure.  Orders received to continue warfarin with pharmacy dosing assistance.  Patient's PTA warfarin dosage was reportedly 5 mg daily except  2.5 mg on Tuesdays and Thursdays, last taken 3/13 at 1800.  Goal of Therapy:  INR 2 - 3   Today, 06/24/18  INR therapeutic at 2.7  Hgb 10.2, ( 3/15)   Would normally be due for warfarin 5 mg today but suspect requirements may be lower than usual in setting of HF exacerbation.   Plan:   Warfarin 2.5 mg PO today at 1800  INR daily while inpatient  Monitor for signs and symptoms of bleeding    Royetta Asal, PharmD, BCPS Pager [phone] 06/24/2018 8:44 AM

## 2018-06-25 DIAGNOSIS — B955 Unspecified streptococcus as the cause of diseases classified elsewhere: Secondary | ICD-10-CM

## 2018-06-25 DIAGNOSIS — Z515 Encounter for palliative care: Secondary | ICD-10-CM

## 2018-06-25 DIAGNOSIS — Z66 Do not resuscitate: Secondary | ICD-10-CM

## 2018-06-25 DIAGNOSIS — J9601 Acute respiratory failure with hypoxia: Secondary | ICD-10-CM

## 2018-06-25 DIAGNOSIS — R7881 Bacteremia: Secondary | ICD-10-CM

## 2018-06-25 LAB — BASIC METABOLIC PANEL
Anion gap: 9 (ref 5–15)
BUN: 30 mg/dL — ABNORMAL HIGH (ref 8–23)
CO2: 30 mmol/L (ref 22–32)
Calcium: 7.7 mg/dL — ABNORMAL LOW (ref 8.9–10.3)
Chloride: 88 mmol/L — ABNORMAL LOW (ref 98–111)
Creatinine, Ser: 0.8 mg/dL (ref 0.44–1.00)
GFR calc Af Amer: >60 mL/min (ref >60)
GFR calc non Af Amer: >60 mL/min (ref >60)
Glucose, Bld: 99 mg/dL (ref 70–99)
Potassium: 3.9 mmol/L (ref 3.5–5.1)
Sodium: 127 mmol/L — ABNORMAL LOW (ref 135–145)

## 2018-06-25 LAB — PROTIME-INR
INR: 2.1 — ABNORMAL HIGH (ref 0.8–1.2)
Prothrombin Time: 23.6 seconds — ABNORMAL HIGH (ref 11.4–15.2)

## 2018-06-25 LAB — PROCALCITONIN: PROCALCITONIN: 0.2 ng/mL

## 2018-06-25 MED ORDER — DIAZEPAM 2 MG PO TABS
2.0000 mg | ORAL_TABLET | Freq: Every day | ORAL | Status: DC
  Administered 2018-06-25: 2 mg via ORAL
  Filled 2018-06-25: qty 1

## 2018-06-25 MED ORDER — WARFARIN SODIUM 4 MG PO TABS
4.0000 mg | ORAL_TABLET | Freq: Once | ORAL | Status: AC
  Administered 2018-06-25: 4 mg via ORAL
  Filled 2018-06-25: qty 1

## 2018-06-25 NOTE — Progress Notes (Signed)
PROGRESS NOTE                                                                                                                                                                                                             Patient Demographics:    Dominigue Gellner, is a 85 y.o. female, DOB - 07-May-1927, KYH:[id number]  Admit date - 06/23/2018   Admitting Physician Nita Sells, MD  Outpatient Primary MD for the patient is Mayra Neer, MD  LOS - 2   Chief Complaint  Patient presents with   Shortness of Breath       Brief Narrative    85 y.o.femalewithlast medicalhistory of cirrhosis, chronic AFib, chronic back pain, HTN, gastritis/GERD, pulmonary HTN, AVR 2005 on coumadin, and bacterial endocarditis, with recent hospitalization secondary to sepsis from group C streptococcus bacteremia with left foot cellulitis and ulcer, 06/21/2018, patient presents yesterday with complaints of shortness of breath, she required BiPAP initially, her work-up Significant for volume overload, likely has had diuresis has been stopped on recent discharge, she was admitted for further work-up and diuresis.   Subjective:    Anniece Bleiler today reports generalized weakness, feeling frail, L, still reports dyspnea.  Reports her respiratory status still far from baseline   Assessment  & Plan :    Principal Problem:   Acute on chronic respiratory failure with hypoxia (HCC) Active Problems:   Chronic gastritis   H/O aortic valve replacement   Emphysema lung (HCC)   Streptococcal bacteremia   Dyspnea   Other cirrhosis of liver (HCC)   Left foot drop   COPD with chronic bronchitis (HCC)   Acute hypoxemic respiratory failure (HCC)   Heart failure (HCC)  Acute on chronic hypoxic respiratory failure -Required BiPAP initially, work-up significant for volume overload, most likely as her Demadex has been held on recent discharge. -Patient is currently on room air,  repeat chest x-ray showing improvement of volume status, continue with IV Lasix 40 mg twice daily . -Patient continues to have Sakib subjective dyspnea I have started her on PRN morphine, and oxygen for comfort . -No clinical evidence of pneumonia, so no need to cover for H CAP.  Acute on chronic diastolic CHF -Echo on April 2019 significant for EF 60 to 65%, continue with IV Lasix in the setting of volume overload  Recent  C streptococcus bacteremia with left foot cellulitis and ulcer °-Cellulitis and foot ulcer significantly improved, her work-up during recent admission including for negative TEE, continue with IV Rocephin. °  °Chronic atrial fibrillation:  °- Rate  controlled, continued diltiazem and Coumadin  °  °History of brioprosthetic AVR:  °- Mild stenosis on most recent echo. Continue coumadin with INR goal 2-3, °   °Pulmonary HTN, emphysema:  °Patient will have follow-up with pulmonology, Dr. Byrum 4/14.  Currently stable °  °Left foot drop: s/p orthopedic procedure 1987.  °  °Hyponatremia: °-In the setting of volume overload, sodium is 127 today, continue with IV diuresis  °  °Dyslipidemia:  °- Continue statin °  °GERD:  °- PPI ° °Goals of care °-Patient reports she is interested on hospice/palliative care, where she is feeling tired, exhausted, uncomfortable, short of breath, reports she has went through a lot, with his medicine has been consulted, started her on PRN morphine for dyspnea and pain ° °Code Status : DNR ° °Family Communication  : Cussed with son at bedside 06/24/2018 ° °Disposition Plan  : Pending palliative care meeting today ° °Consults  : Palliative consult ° °Procedures  : None ° °DVT Prophylaxis  :  On Warfarin  ° °Lab Results  °Component Value Date  ° PLT 389 06/23/2018  ° ° °Antibiotics  :   ° °Anti-infectives (From admission, onward)  ° Start     Dose/Rate Route Frequency Ordered Stop  ° 06/23/18 1200  cefTRIAXone (ROCEPHIN) 2 g in sodium chloride  0.9 % 100 mL IVPB    ° 2 g °200 mL/hr over 30 Minutes Intravenous Every 24 hours 06/23/18 0946 06/30/18 1159  ° 06/23/18 0945  cefTRIAXone (ROCEPHIN) IVPB  Status:  Discontinued    °Note to Pharmacy:  Indication:  Group G streptococcal bacteremia °Last Day of Therapy:  06/29/18 °Labs - Once weekly:  CBC/D and BMP, °Labs - Every other week:  ESR and CRP    ° 2 g Intravenous Every 24 hours 06/23/18 0937 06/23/18 0945  ° 06/23/18 0345  vancomycin (VANCOCIN) IVPB 1000 mg/200 mL premix    ° 1,000 mg °200 mL/hr over 60 Minutes Intravenous  Once 06/23/18 0334 06/23/18 0642  ° 06/23/18 0345  ceFEPIme (MAXIPIME) 2 g in sodium chloride 0.9 % 100 mL IVPB    ° 2 g °200 mL/hr over 30 Minutes Intravenous  Once 06/23/18 0334 06/23/18 0528  °  ° °  ° Objective:  ° °Vitals:  ° 06/25/18 0427 06/25/18 0519 06/25/18 0520 06/25/18 0939  °BP: (!) 89/47 (!) 95/56 (!) 96/58 (!) 109/55  °Pulse: (!) 51 63  84  °Resp: 15     °Temp: (!) 97.5 °F (36.4 °C)     °TempSrc: Oral     °SpO2: 99%   97%  °Weight:      °Height:      ° ° °Wt Readings from Last 3 Encounters:  °06/25/18 59.2 kg  °06/21/18 61.3 kg  °05/04/18 56.7 kg  ° ° ° °Intake/Output Summary (Last 24 hours) at 06/25/2018 1335 °Last data filed at 06/25/2018 0434 °Gross per 24 hour  °Intake 240 ml  °Output 2850 ml  °Net -2610 ml  ° ° ° °Physical Exam ° °Awake, alert, oriented x3, chronically ill-appearing, extremely frail  °Diminished air entry at the bases with some crackles  °Regular rate and rhythm, no rubs or gallops °+ve B.Sounds, Abd Soft, No tenderness,  No rebound - guarding or rigidity. °No Cyanosis, lower extremity cellulitis has improved ° ° ° Data Review:  ° ° °CBC °Recent Labs  °Lab 06/19/18 °0831   06/20/18 °0403 06/21/18 °0442 06/23/18 °0337 06/23/18 °0349  °WBC 11.6* 10.5 10.2 19.7*  --   °HGB 9.3* 8.4* 8.3* 9.9* 10.2*  °HCT 28.4* 26.1* 25.4* 30.0* 30.0*  °PLT 157 156 259 389  --   °MCV 90.2 90.3 90.4 90.4  --   °MCH 29.5 29.1 29.5 29.8  --   °MCHC 32.7 32.2 32.7 33.0  --   °RDW  13.6 13.5 13.7 14.2  --   °LYMPHSABS  --   --   --  1.7  --   °MONOABS  --   --   --  1.5*  --   °EOSABS  --   --   --  0.4  --   °BASOSABS  --   --   --  0.1  --   ° ° °Chemistries  °Recent Labs  °Lab 06/20/18 °0403 06/21/18 °0442 06/23/18 °0337 06/23/18 °0349 06/24/18 °0422 06/25/18 °0344  °NA 129* 132* 125* 124* 129* 127*  °K 4.0 4.2 3.9 4.0 3.6 3.9  °CL 92* 94* 89*  --  91* 88*  °CO2 28 30 23  --  28 30  °GLUCOSE 131* 107* 144*  --  93 99  °BUN 22 21 18  --  17 30*  °CREATININE 0.72 0.59 0.71  --  0.64 0.80  °CALCIUM 7.9* 8.1* 8.3*  --  7.8* 7.7*  °AST  --   --  32  --   --   --   °ALT  --   --  26  --   --   --   °ALKPHOS  --   --  119  --   --   --   °BILITOT  --   --  0.6  --   --   --   ° °------------------------------------------------------------------------------------------------------------------ °No results for input(s): CHOL, HDL, LDLCALC, TRIG, CHOLHDL, LDLDIRECT in the last 72 hours. ° °No results found for: HGBA1C °------------------------------------------------------------------------------------------------------------------ °No results for input(s): TSH, T4TOTAL, T3FREE, THYROIDAB in the last 72 hours. ° °Invalid input(s): FREET3 °------------------------------------------------------------------------------------------------------------------ °No results for input(s): VITAMINB12, FOLATE, FERRITIN, TIBC, IRON, RETICCTPCT in the last 72 hours. ° °Coagulation profile °Recent Labs  °Lab 06/20/18 °0403 06/21/18 °0442 06/23/18 °0337 06/24/18 °0422 06/25/18 °0344  °INR 2.3* 1.9* 2.7* 2.7* 2.1*  ° ° °No results for input(s): DDIMER in the last 72 hours. ° °Cardiac Enzymes °Recent Labs  °Lab 06/24/18 °1900  °TROPONINI <0.03  ° °------------------------------------------------------------------------------------------------------------------ °   °Component Value Date/Time  ° BNP 274.0 (H) 06/23/2018 0337  ° ° °Inpatient Medications ° °Scheduled Meds: °• atorvastatin  20 mg Oral Daily  °• busPIRone  15  mg Oral BID  °• cycloSPORINE  1 drop Both Eyes BID  °• diltiazem  120 mg Oral BID  °• furosemide  40 mg Intravenous BID  °• isosorbide mononitrate  15 mg Oral Daily  °• lisinopril  20 mg Oral BID  °• multivitamin  1 tablet Oral BID  °• oxybutynin  5 mg Oral Daily  °• pantoprazole  40 mg Oral BID  °• sodium chloride flush  3 mL Intravenous Q12H  °• Warfarin - Pharmacist Dosing Inpatient   Does not apply q1800  ° °Continuous Infusions: °• sodium chloride    °• cefTRIAXone (ROCEPHIN)  IV 2 g (06/25/18 1141)  ° °PRN Meds:.sodium chloride, acetaminophen, alum & mag hydroxide-simeth, HYDROcodone-acetaminophen, morphine injection, nitroGLYCERIN, ondansetron (ZOFRAN) IV, ondansetron, sodium chloride flush, sodium chloride flush, traZODone ° °Micro Results °Recent Results (from the past 240 hour(s))  °Culture, blood (routine   Culture, blood (routine x 2)     Status: None   Collection Time: 06/17/18  4:03 AM  Result Value Ref Range Status   Specimen Description   Final    BLOOD LEFT ANTECUBITAL Performed at [facility] [address] [place], [place] [address]    Special Requests   Final    BOTTLES DRAWN AEROBIC AND ANAEROBIC Blood Culture adequate volume Performed at [facility] [address], [place], [place] [address]    Culture   Final    NO GROWTH 5 DAYS Performed at [facility], [place] [address] [place], [place] [address]    Report Status 06/22/2018 FINAL  Final  Culture, blood (routine x 2)     Status: None   Collection Time: 06/17/18  4:03 AM  Result Value Ref Range Status   Specimen Description   Final    BLOOD LEFT ARM Performed at [facility], [place] [address] [place], [place] [address]    Special Requests   Final    BOTTLES DRAWN AEROBIC ONLY Blood Culture adequate volume Performed at [facility] [address], [place], [place] [address]    Culture   Final    NO GROWTH 5 DAYS Performed at [facility], [place] [address],  [place], [place] [address]    Report Status 06/22/2018 FINAL  Final  Culture, blood (Routine X 2) w Reflex to ID Panel     Status: None (Preliminary result)   Collection Time: 06/23/18  3:37 AM  Result Value Ref Range Status   Specimen Description   Final    BLOOD RIGHT ARM Performed at [facility], [place] [address], [place], [place] [address]    Special Requests   Final    BOTTLES DRAWN AEROBIC AND ANAEROBIC Blood Culture results may not be optimal due to an excessive volume of blood received in culture bottles Performed at [facility] [address], [place], [place] [address]    Culture   Final    NO GROWTH 2 DAYS Performed at [facility] [address] [place], [place] [address]    Report Status PENDING  Incomplete  Culture, blood (Routine X 2) w Reflex to ID Panel     Status: None (Preliminary result)   Collection Time: 06/23/18  3:49 AM  Result Value Ref Range Status   Specimen Description   Final    BLOOD LEFT HAND Performed at [facility] [address], [place], [place] [address]    Special Requests   Final    BOTTLES DRAWN AEROBIC AND ANAEROBIC Blood Culture adequate volume Performed at [facility] [address], [place], [place] [address]    Culture   Final    NO GROWTH 2 DAYS Performed at [facility] [address] [place], [place] [address]    Report Status PENDING  Incomplete  Respiratory Panel by PCR     Status: None   Collection Time: 06/23/18  5:43 AM  Result Value Ref Range Status   Adenovirus NOT DETECTED NOT DETECTED Final   Coronavirus 229E NOT DETECTED NOT DETECTED Final    Comment: (NOTE) The Coronavirus on the Respiratory Panel, DOES NOT test for the novel  Coronavirus (2019 nCoV)    Coronavirus HKU1 NOT DETECTED NOT DETECTED Final   Coronavirus NL63 NOT DETECTED NOT DETECTED Final   Coronavirus OC43 NOT DETECTED NOT DETECTED Final  Metapneumovirus NOT DETECTED NOT  DETECTED Final   Rhinovirus / Enterovirus NOT DETECTED NOT DETECTED Final   Influenza A NOT DETECTED NOT DETECTED Final   Influenza B NOT DETECTED NOT DETECTED Final   Parainfluenza Virus 1 NOT DETECTED NOT DETECTED Final   Parainfluenza Virus 2 NOT DETECTED NOT DETECTED Final   Parainfluenza Virus 3 NOT DETECTED NOT DETECTED Final   Parainfluenza Virus 4 NOT DETECTED NOT DETECTED Final   Respiratory Syncytial Virus NOT DETECTED NOT DETECTED Final   Bordetella pertussis NOT DETECTED NOT DETECTED Final   Chlamydophila pneumoniae NOT DETECTED NOT DETECTED Final   Mycoplasma pneumoniae NOT DETECTED NOT DETECTED Final    Comment: Performed at [facility], [place] [address] [place], [place] [address]    Radiology Reports Dg Chest 2 View  Result Date: 06/24/2018 CLINICAL DATA:  Shortness of breath, pleural effusions EXAM: CHEST - 2 VIEW COMPARISON:  06/23/2018 FINDINGS: Pulmonary vascular congestion with possible mild interstitial edema, improved. Small bilateral pleural effusions, stable versus mildly decreased. Associated left basilar atelectasis. No pneumothorax. Right arm PICC terminates in the mid SVC. Median sternotomy. IMPRESSION: Cardiomegaly with mild interstitial edema and small bilateral pleural effusions, mildly improved. Electronically Signed   By: Julian Hy M.D.   On: 06/24/2018 08:50   Dg Chest 2 View  Result Date: 06/14/2018 CLINICAL DATA:  Cough for 3 days COMPARISON:  Radiograph 05/08/2018 FINDINGS: Sternotomy wires overlie enlarged cardiac silhouette. Small pleural effusion seen on lateral projection. Upper lungs are clear. No focal consolidation. Atherosclerotic calcification of the aorta. IMPRESSION: Cardiomegaly and bilateral effusions. Findings suggest congestive heart failure Electronically Signed   By: Suzy Bouchard M.D.   On: 06/14/2018 10:39   Dg Lumbar Spine Complete  Result Date: 06/14/2018 CLINICAL DATA:  Post unwitnessed fall with low back pain. EXAM:  LUMBAR SPINE - COMPLETE 4+ VIEW COMPARISON:  None. FINDINGS: There is no evidence of lumbar spine fracture. Alignment is normal. Multilevel osteoarthritic changes with disc space narrowing, subchondral sclerosis, osteophyte formation. Calcific atherosclerotic disease of the aorta. IMPRESSION: 1. No acute fracture or dislocation identified about the lumbosacral spine. 2. Multilevel osteoarthritic changes, moderate. Electronically Signed   By: Fidela Salisbury M.D.   On: 06/14/2018 13:32   Dg Chest Port 1 View  Result Date: 06/23/2018 CLINICAL DATA:  Initial evaluation for acute shortness of breath. EXAM: PORTABLE CHEST 1 VIEW COMPARISON:  Prior radiograph from 05/18/2018. FINDINGS: Median sternotomy wires with underlying valvular prosthesis noted, stable. Cardiomegaly unchanged. Mediastinal silhouette normal. Aortic atherosclerosis. Right-sided PICC catheter remains in place with tip overlying the cavoatrial junction, stable. Lungs normally inflated. Moderate layering bilateral pleural effusions. Underlying moderate diffuse pulmonary interstitial edema. Associated bibasilar confluent opacities favored to reflect atelectasis and/or edema. No other consolidative opacity. No pneumothorax. No acute osseous finding.  Osteopenia noted. IMPRESSION: 1. Cardiomegaly with small layering bilateral pleural effusions and moderate diffuse pulmonary edema, suggesting CHF. 2. Superimposed confluent bibasilar opacities, favored to reflect atelectasis and/or effusion. Superimposed infiltrates would be difficult to exclude, and could be considered in the correct clinical setting. Electronically Signed   By: Jeannine Boga M.D.   On: 06/23/2018 03:27   Dg Chest Port 1 View  Result Date: 06/16/2018 CLINICAL DATA:  Evaluate PICC line placement. EXAM: PORTABLE CHEST 1 VIEW COMPARISON:  June 14, 2018 FINDINGS: A right PICC line terminates in the central SVC. Bilateral pleural effusions, right greater than left, are more  pronounced on this frontal view compared to the previous frontal view, likely due to difference  positioning and layering of the effusions. Cardiomegaly. The hila and mediastinum are normal. No pneumothorax. No nodules or masses. IMPRESSION: 1. The right PICC line is in good position. 2. Bilateral pleural effusions, right greater than left, with under lying atelectasis. Electronically Signed   By: David  Williams III M.D   On: 06/16/2018 18:28  ° °Dg Foot 2 Views Left ° °Result Date: 06/14/2018 °CLINICAL DATA:  Initial evaluation for nonhealing wound at lateral left foot. Recent trauma with fall. EXAM: LEFT FOOT - 2 VIEW COMPARISON:  Prior radiograph from 01/01/2012 FINDINGS: No acute fracture or dislocation. Fixation screws in place at the left third and fourth digits without complication. Scattered osteoarthritic changes present about the foot with underlying osteopenia. Soft tissue swelling overlies the lateral aspect of the base of the left fifth metatarsal, likely related to overlying ulcer/wound. No evidence for acute osteomyelitis. No dissecting soft tissue emphysema or other worrisome feature. Vascular calcifications noted within the lower leg. IMPRESSION: 1. Soft tissue ulceration/wound overlying the base of the left fifth metatarsal. No radiographic evidence for acute osteomyelitis. 2. No other acute osseous abnormality about the left foot. 3. Postoperative changes at the left third and fourth digits without adverse features. 4. Advanced osteopenia. Electronically Signed   By: Benjamin  McClintock M.D.   On: 06/14/2018 13:39  ° °Us Ekg Site Rite ° °Result Date: 06/16/2018 °If Site Rite image not attached, placement could not be confirmed due to current cardiac rhythm. ° ° ° ° °  M.D on 06/25/2018 at 1:35 PM ° °Between 7am to 7pm - Pager - [phone] ° °After 7pm go to [email or web address] - password [email or web address] ° °[facility] -  Office  [phone] ° ° °  °

## 2018-06-25 NOTE — Consult Note (Signed)
Consultation Note Date: 06/25/2018   Patient Name: Brittany [name]  DOB: 24-Oct-1927  MRN: [id number]  Age / Sex: 85 y.o., female  PCP: Brittany Neer, MD Referring Physician: Albertine Patricia, MD  Reason for Consultation: Establishing goals of care and Psychosocial/spiritual support  HPI/Patient Profile: 85 y.o. female admitted on 06/23/2018 with PMH significant for suspected cirrhosis, atrial fibrillation, CHF, chronic back pain, dyslipidemia, GERD, impaired hearing, hypertension, gastritis, history of bacterial endocarditis, pulmonary hypertension, and history of aortic valve replacement 2005.  Multiple hospitalizations over the past six months.  She lives at [facility], with recent hospitalization secondary to sepsis from group C streptococcus bacteremia with left foot cellulitis and ulcer, 06/21/2018, patient presents yesterday with complaints of shortness of breath, she required BiPAP initially, her work-up significant for volume overload, admitted for treatment and stalbization.  In the  ED the patient had a temperature of 102.3. respiratory rate of 22, pulse of 94. Blood pressure of 168/75. She was saturating 94% on room air.  CXR demonstrated bilateral pleural effusions and an enlarged cardiac silhouette suggestive of CHF. X-ray or the left foot demonstrated soft tissue ulceration with no radiographic evidence for acute osteomyelitis.   Patient has verbalized to her family her "readiness" and desire for less aggressive interventions to prolong life.  Patient and her family face treatment option decisions, advanced directive decisions and anticipatory care needs.   Clinical Assessment and Goals of Care:   This NP Brittany [name] reviewed medical records, received report from team, assessed the patient and then meet at the patient's bedside along with her two sons and daughter/Brittany  [name] to discuss diagnosis, prognosis, GOC, EOL wishes disposition and options.  Concept of Hospice and Palliative Care were discussed  A detailed discussion was had today regarding advanced directives.  Concepts specific to code status, artifical feeding and hydration, continued IV antibiotics and rehospitalization was had.  The difference between a aggressive medical intervention path  and a palliative comfort care path for this patient at this time was had.  Values and goals of care important to patient and her  family were attempted to be elicited.  Patient and family verbalized an understanding of the patient's multiple comorbidities and high risk for decompensation.  Ultimately the patient hopes to die comfortably at home, she wishes "to die in my sleep but I know that will not happen".  For now patient would like to complete her course of antibiotics initiated on last admission. ( daughter has been administering at home vis PICC line- all supplies and meds are currently in the home)  She is clear and comfortable with her decision to a make a shift to a comfort approach once she is discharged home,  And welcomes hospice services to support her as she transitions at EOL.  Prognosis is less than six months with her multiple co-morbidies and desire to allow for a natural death.  MOST form introduced  Hard Choices left for review  Natural trajectory and expectations at EOL were discussed.  Questions  and concerns addressed.   Family encouraged to call with questions or concerns.    PMT will continue to support holistically.   SUMMARY OF RECOMMENDATIONS     Code Status/Advance Care Planning:  DNR    Symptom Management:   Insomnia: Valium 2 mg po q hs   Patient has used Valium at home for many years  Dyspnea:  Morphine 1 mg IV every 4 hrs prn  Palliative Prophylaxis:   Bowel Regimen, Delirium Protocol, Frequent Pain Assessment and Oral Care  Additional Recommendations  (Limitations, Scope, Preferences):  Avoid Hospitalization, No Artificial Feeding and No IV Fluids  Psycho-social/Spiritual:   Desire for further Chaplaincy support:no  Additional Recommendations: Education on Hospice  Prognosis:   < 6 months  Discharge Planning: Home with Hospice      Primary Diagnoses: Present on Admission: . Dyspnea . (Resolved) Atrial fibrillation ([place]) . Chronic gastritis . Emphysema lung ([place]) . Streptococcal bacteremia . Acute on chronic respiratory failure with hypoxia ([place]) . Acute hypoxemic respiratory failure ([place])   I have reviewed the medical record, interviewed the patient and family, and examined the patient. The following aspects are pertinent.  Past Medical History:  Diagnosis Date  . Abnormal liver diagnostic imaging    suspected cirrhosis  . Arthritis   . Atrial fibrillation ([place])   . Chronic back pain   . Diverticulosis   . Dyslipidemia    takes Lipitor daily  . Dysrhythmia    Atrial Fibrillation  . Foot drop    SINCE 1987  . GERD (gastroesophageal reflux disease)   . H/O hiatal hernia   . Hearing impaired    Bilateral hearing aids  . History of bacterial endocarditis   . History of gastritis   . History of stomach ulcers    many yrs ago  . History of TIA (transient ischemic attack)    2013-- RESIDUAL PERIPHERAL VISION RIGHT EYE--  RESOLVED  . Hypertension   . IC (interstitial cystitis)   . Macular degeneration   . Pulmonary hypertension ([place]) 10/2013   Based on TTE  . S/P aortic valve replacement    2005  . Urine incontinence    Social History   Socioeconomic History  . Marital status: Widowed    Spouse name: Not on file  . Number of children: 4  . Years of education: Not on file  . Highest education level: Not on file  Occupational History  . Occupation: retired  Scientific laboratory technician  . Financial resource strain: Not on file  . Food insecurity:    Worry: Not on file    Inability: Not on file  . Transportation  needs:    Medical: Not on file    Non-medical: Not on file  Tobacco Use  . Smoking status: Former Smoker    Packs/day: 0.50    Years: 40.00    Pack years: 20.00    Types: Cigarettes    Start date: 05/06/1949    Last attempt to quit: 03/06/1990    Years since quitting: 28.3  . Smokeless tobacco: Never Used  Substance and Sexual Activity  . Alcohol use: No    Alcohol/week: 0.0 standard drinks    Comment: Remote social EtOH  . Drug use: No  . Sexual activity: Never  Lifestyle  . Physical activity:    Days per week: Not on file    Minutes per session: Not on file  . Stress: Not on file  Relationships  . Social connections:    Talks on phone:  Not on file    Gets together: Not on file    Attends religious service: Not on file    Active member of club or organization: Not on file    Attends meetings of clubs or organizations: Not on file    Relationship status: Not on file  Other Topics Concern  . Not on file  Social History Narrative   She is originally from [place], [place]. She has previously lived in [place] for 10 years. She has traveled to all 49 states as well as every [place] in [place]. Prior travel to [place], [place], [place] [employer]. Previously worked as an Optometrist. Remote exposure to a parakeet for 2 years in 54[place]. No known asbestos, mold, or hot tub exposure. She enjoys doing needle point & hand crafts.     Family History  Problem Relation Age of Onset  . Heart disease Father   . Stomach cancer Maternal Grandmother   . Esophageal cancer Maternal Grandfather   . Bipolar disorder Son        Committed Suicide  . Coronary artery disease Brother   . Bipolar disorder Brother        commited suiside at 79yo  . Alcohol abuse Sister        sister #1  . Alcohol abuse Sister        sister #2  . Colon cancer Maternal Aunt   . Lung disease Neg Hx    Scheduled Meds: . atorvastatin  20 mg Oral Daily  . busPIRone  15 mg Oral BID  . cycloSPORINE  1 drop Both Eyes BID  . diltiazem  120 mg  Oral BID  . furosemide  40 mg Intravenous BID  . isosorbide mononitrate  15 mg Oral Daily  . lisinopril  20 mg Oral BID  . multivitamin  1 tablet Oral BID  . oxybutynin  5 mg Oral Daily  . pantoprazole  40 mg Oral BID  . sodium chloride flush  3 mL Intravenous Q12H  . Warfarin - Pharmacist Dosing Inpatient   Does not apply q1800   Continuous Infusions: . sodium chloride    . cefTRIAXone (ROCEPHIN)  IV 2 g (06/24/18 1059)   PRN Meds:.sodium chloride, acetaminophen, alum & mag hydroxide-simeth, HYDROcodone-acetaminophen, morphine injection, nitroGLYCERIN, ondansetron (ZOFRAN) IV, ondansetron, sodium chloride flush, sodium chloride flush, traZODone Medications Prior to Admission:  Prior to Admission medications   Medication Sig Start Date End Date Taking? Authorizing Provider  acetaminophen (TYLENOL) 325 MG tablet Take 650 mg by mouth daily as needed for mild pain.   Yes [provider]  atorvastatin (LIPITOR) 20 MG tablet Take 20 mg by mouth daily.   Yes [provider]  busPIRone (BUSPAR) 15 MG tablet Take 1 tablet (15 mg total) by mouth 2 (two) times daily. 03/16/18  Yes Armbruster, Carlota Raspberry, MD  cefTRIAXone (ROCEPHIN) IVPB Inject 2 g into the vein daily. Indication:  Group G streptococcal bacteremia Last Day of Therapy:  06/29/18 Labs - Once weekly:  CBC/D and BMP, Labs - Every other week:  ESR and CRP 06/20/18  Yes Pokhrel, Laxman, MD  cycloSPORINE (RESTASIS) 0.05 % ophthalmic emulsion Place 1 drop into both eyes 2 (two) times daily.   Yes [provider]  diltiazem (CARDIZEM) 120 MG tablet Take 120 mg by mouth 2 (two) times daily.   Yes [provider]  HYDROcodone-acetaminophen (NORCO/VICODIN) 5-325 MG tablet Take 1 tablet by mouth every 6 (six) hours as needed for severe pain. 02/02/18  Yes Aline August, MD  isosorbide mononitrate (IMDUR) 30 MG 24 hr tablet Take 0.5 tablets (15 mg total) by mouth daily. 06/21/18  Yes Pokhrel, Laxman, MD  lisinopril  (PRINIVIL,ZESTRIL) 20 MG tablet Take 20 mg by mouth 2 (two) times daily.   Yes [provider]  Multiple Vitamins-Minerals (ICAPS AREDS 2) CAPS Take 1 capsule by mouth 2 (two) times daily.   Yes [provider]  nitroGLYCERIN (NITROSTAT) 0.4 MG SL tablet Place 1 tablet (0.4 mg total) under the tongue every 5 (five) minutes as needed for chest pain. 02/02/18  Yes Aline August, MD  ondansetron (ZOFRAN) 8 MG tablet Take 1 tablet (8 mg total) by mouth 2 (two) times daily. Patient taking differently: Take 8 mg by mouth every 8 (eight) hours as needed for nausea or vomiting.  02/22/18  Yes Armbruster, Carlota Raspberry, MD  oxybutynin (DITROPAN) 5 MG tablet Take 5 mg by mouth daily. 06/06/18  Yes [provider]  pantoprazole (PROTONIX) 40 MG tablet Take 40 mg by mouth 2 (two) times daily. 06/18/18  Yes [provider]  Polyvinyl Alcohol-Povidone (REFRESH OP) Place 1 drop into both eyes 2 (two) times daily.   Yes [provider]  torsemide (DEMADEX) 20 MG tablet Take 20 mg by mouth daily as needed (retaining fluid).    Yes [provider]  warfarin (COUMADIN) 5 MG tablet Take as directed by [facility] Patient taking differently: Take 2.5-5 mg by mouth daily at 6 PM. Take '5mg'$  by mouth daily at 6pm. Take 2.'5mg'$  by mouth on T/Th. 06/21/18  Yes Nahser, Wonda Cheng, MD   Allergies  Allergen Reactions  . Amlodipine Swelling and Other (See Comments)     Unspecified swelling reaction  . Amoxicillin Diarrhea, Nausea And Vomiting and Other (See Comments)    Has patient had a PCN reaction causing immediate rash, facial/tongue/throat swelling, SOB or lightheadedness with hypotension: No Has patient had a PCN reaction causing severe rash involving mucus membranes or skin necrosis: No Has patient had a PCN reaction that required hospitalization: No Has patient had a PCN reaction occurring within the last 10 years: No If all of the above answers are "NO", then may proceed  with Cephalosporin use.  . Carafate [Sucralfate] Swelling and Other (See Comments)    Reaction:  Knee swelling/redness   . Cephalexin Diarrhea  . Codeine Nausea And Vomiting  . Irbesartan Swelling and Other (See Comments)    Reaction:  Facial/hand swelling and numbness   . Morphine And Related Other (See Comments)    Pt states that she has a history of addiction with this medication.      . Neurontin [Gabapentin] Swelling and Other (See Comments)    Reaction:  Leg swelling   . Nitrofurantoin Monohyd Macro Diarrhea and Nausea And Vomiting  . Prednisone Other (See Comments)    Reaction:  Elevated BP  . Sulfa Antibiotics Rash   Review of Systems  Constitutional: Positive for fatigue.  Musculoskeletal: Positive for back pain.  Neurological: Positive for weakness.    Physical Exam Cardiovascular:     Rate and Rhythm: Normal rate and regular rhythm.  Pulmonary:     Effort: Pulmonary effort is normal.  Skin:    General: Skin is warm and dry.  Neurological:     Mental Status: She is alert and oriented to person, place, and time.     Vital Signs: BP (!) 109/55   Pulse 84   Temp (!) 97.5 F (36.4 C) (Oral)   Resp 15   Ht  $'5\' 1"'d$  (1.549 m)   Wt 59.2 kg   SpO2 97%   BMI 24.66 kg/m  Pain Scale: 0-10   Pain Score: Asleep   SpO2: SpO2: 97 % O2 Device:SpO2: 97 % O2 Flow Rate: .O2 Flow Rate (L/min): 2 L/min  IO: Intake/output summary:   Intake/Output Summary (Last 24 hours) at 06/25/2018 1038 Last data filed at 06/25/2018 0434 Gross per 24 hour  Intake 240 ml  Output 2850 ml  Net -2610 ml    LBM: Last BM Date: 06/24/18 Baseline Weight: Weight: 61.4 kg Most recent weight: Weight: 59.2 kg     Palliative Assessment/Data: 30 %    Discussed with Dr Waldron Labs  Time In: 1430 Time Out: 1600 Time Total: 90 minutes Greater than 50%  of this time was spent counseling and coordinating care related to the above assessment and plan.  Signed by: Brittany Lessen, NP   Please  contact Palliative Medicine Team phone at [phone] for questions and concerns.  For individual provider: See Shea Evans

## 2018-06-25 NOTE — Progress Notes (Signed)
[facility] for: Warfarin Indication: atrial fibrillation in setting of bioprosthetic AVR  Allergies  Allergen Reactions  . Amlodipine Swelling and Other (See Comments)     Unspecified swelling reaction  . Amoxicillin Diarrhea, Nausea And Vomiting and Other (See Comments)    Has patient had a PCN reaction causing immediate rash, facial/tongue/throat swelling, SOB or lightheadedness with hypotension: No Has patient had a PCN reaction causing severe rash involving mucus membranes or skin necrosis: No Has patient had a PCN reaction that required hospitalization: No Has patient had a PCN reaction occurring within the last 10 years: No If all of the above answers are "NO", then may proceed with Cephalosporin use.  . Carafate [Sucralfate] Swelling and Other (See Comments)    Reaction:  Knee swelling/redness   . Cephalexin Diarrhea  . Codeine Nausea And Vomiting  . Irbesartan Swelling and Other (See Comments)    Reaction:  Facial/hand swelling and numbness   . Morphine And Related Other (See Comments)    Pt states that she has a history of addiction with this medication.      . Neurontin [Gabapentin] Swelling and Other (See Comments)    Reaction:  Leg swelling   . Nitrofurantoin Monohyd Macro Diarrhea and Nausea And Vomiting  . Prednisone Other (See Comments)    Reaction:  Elevated BP  . Sulfa Antibiotics Rash    Patient Measurements: Height: 5\' 1"  (154.9 cm) Weight: 130 lb 8.2 oz (59.2 kg) IBW/kg (Calculated) : 47.8   Vital Signs: Temp: 97.5 F (36.4 C) (03/16 0427) Temp Source: Oral (03/16 0427) BP: 109/55 (03/16 0939) Pulse Rate: 84 (03/16 0939)  Labs: Recent Labs    06/23/18 0337 06/23/18 0349 06/24/18 0422 06/24/18 1900 06/25/18 0344  HGB 9.9* 10.2*  --   --   --   HCT 30.0* 30.0*  --   --   --   PLT 389  --   --   --   --   LABPROT 27.9*  --  28.4*  --  23.6*  INR 2.7*  --  2.7*  --  2.1*  CREATININE 0.71  --  0.64  --  0.80   TROPONINI  --   --   --  <0.03  --     Estimated Creatinine Clearance: 39.4 mL/min (by C-G formula based on SCr of 0.8 mg/dL).   Medical History: Past Medical History:  Diagnosis Date  . Abnormal liver diagnostic imaging    suspected cirrhosis  . Arthritis   . Atrial fibrillation ([place])   . Chronic back pain   . Diverticulosis   . Dyslipidemia    takes Lipitor daily  . Dysrhythmia    Atrial Fibrillation  . Foot drop    SINCE 1987  . GERD (gastroesophageal reflux disease)   . H/O hiatal hernia   . Hearing impaired    Bilateral hearing aids  . History of bacterial endocarditis   . History of gastritis   . History of stomach ulcers    many yrs ago  . History of TIA (transient ischemic attack)    2013-- RESIDUAL PERIPHERAL VISION RIGHT EYE--  RESOLVED  . Hypertension   . IC (interstitial cystitis)   . Macular degeneration   . Pulmonary hypertension ([place]) 10/2013   Based on TTE  . S/P aortic valve replacement    2005  . Urine incontinence     Medications:  Scheduled:  . atorvastatin  20 mg Oral Daily  . busPIRone  15 mg Oral BID  . cycloSPORINE  1 drop Both Eyes BID  . diltiazem  120 mg Oral BID  . furosemide  40 mg Intravenous BID  . isosorbide mononitrate  15 mg Oral Daily  . lisinopril  20 mg Oral BID  . multivitamin  1 tablet Oral BID  . oxybutynin  5 mg Oral Daily  . pantoprazole  40 mg Oral BID  . sodium chloride flush  3 mL Intravenous Q12H  . Warfarin - Pharmacist Dosing Inpatient   Does not apply q1800   Infusions:  . sodium chloride    . cefTRIAXone (ROCEPHIN)  IV 2 g (06/25/18 1141)   PRN: sodium chloride, acetaminophen, alum & mag hydroxide-simeth, HYDROcodone-acetaminophen, morphine injection, nitroGLYCERIN, ondansetron (ZOFRAN) IV, ondansetron, sodium chloride flush, sodium chloride flush, traZODone  Assessment: 85 y/o F on chronic warfarin for atrial fibrillation in setting of bioprosthetic AVR, admitted with acute on chronic respiratory  failure and likely decompensated heart failure.  Orders received to continue warfarin with pharmacy dosing assistance.   Baseline INR: 2.7  Prior anticoagulation: PTA warfarin dose 5 mg daily except 2.5 mg on T/Th, LD 3/13 @1800   Significant events: none  Goal of Therapy:  INR 2 - 3  Today, 06/25/18  CBC: Hgb 10.2 (3/15)  INR therapeutic at 2.1  Major drug interactions:None; CTX may enhance anticoagulation effects  No bleeding issues per nursing  Eating 10-20% of meals   Plan:   Warfarin 4 mg PO today at 1800; patient has multiple risk factors (recent broad spectrum Abx, reduced PO intake, possible hepatic congestion) for increased warfarin sensitivity but currently INR is not reflecting this, will increase dose to prevent subtherapeutic INR  Daily INR  CBC at least q72 hr while on warfarin  Monitor for signs of bleeding or thrombosis   Tyna Jaksch, PharmD Candidate 06/25/2018 12:28 PM

## 2018-06-26 DIAGNOSIS — R531 Weakness: Secondary | ICD-10-CM

## 2018-06-26 DIAGNOSIS — I5033 Acute on chronic diastolic (congestive) heart failure: Secondary | ICD-10-CM

## 2018-06-26 LAB — BASIC METABOLIC PANEL
Anion gap: 9 (ref 5–15)
BUN: 30 mg/dL — AB (ref 8–23)
CO2: 32 mmol/L (ref 22–32)
Calcium: 8.2 mg/dL — ABNORMAL LOW (ref 8.9–10.3)
Chloride: 89 mmol/L — ABNORMAL LOW (ref 98–111)
Creatinine, Ser: 0.76 mg/dL (ref 0.44–1.00)
GFR calc Af Amer: >60 mL/min (ref >60)
GFR calc non Af Amer: >60 mL/min (ref >60)
Glucose, Bld: 95 mg/dL (ref 70–99)
Potassium: 4 mmol/L (ref 3.5–5.1)
Sodium: 130 mmol/L — ABNORMAL LOW (ref 135–145)

## 2018-06-26 LAB — CBC
HCT: 27.4 % — ABNORMAL LOW (ref 36.0–46.0)
Hemoglobin: 8.8 g/dL — ABNORMAL LOW (ref 12.0–15.0)
MCH: 29.1 pg (ref 26.0–34.0)
MCHC: 32.1 g/dL (ref 30.0–36.0)
MCV: 90.7 fL (ref 80.0–100.0)
Platelets: 416 10*3/uL — ABNORMAL HIGH (ref 150–400)
RBC: 3.02 MIL/uL — ABNORMAL LOW (ref 3.87–5.11)
RDW: 14.3 % (ref 11.5–15.5)
WBC: 10.6 10*3/uL — ABNORMAL HIGH (ref 4.0–10.5)
nRBC: 0 % (ref 0.0–0.2)

## 2018-06-26 LAB — PROTIME-INR
INR: 1.8 — ABNORMAL HIGH (ref 0.8–1.2)
PROTHROMBIN TIME: 20.6 s — AB (ref 11.4–15.2)

## 2018-06-26 MED ORDER — DIAZEPAM 2 MG PO TABS: 2.0000 mg | ORAL_TABLET | Freq: Every evening | ORAL | 0 refills | Status: DC | PRN

## 2018-06-26 MED ORDER — WARFARIN SODIUM 5 MG PO TABS: 5.0000 mg | ORAL_TABLET | Freq: Once | ORAL | Status: DC

## 2018-06-26 MED ORDER — HEPARIN SOD (PORK) LOCK FLUSH 100 UNIT/ML IV SOLN
250.0000 [IU] | INTRAVENOUS | Status: AC | PRN
  Administered 2018-06-26: 250 [IU]

## 2018-06-26 MED ORDER — MORPHINE SULFATE 20 MG/5ML PO SOLN: 2.5000 mg | ORAL | 0 refills | Status: DC | PRN

## 2018-06-26 MED ORDER — METOCLOPRAMIDE HCL 5 MG/ML IJ SOLN
5.0000 mg | Freq: Once | INTRAMUSCULAR | Status: AC
  Administered 2018-06-26: 5 mg via INTRAVENOUS
  Filled 2018-06-26: qty 2

## 2018-06-26 MED ORDER — TORSEMIDE 20 MG PO TABS: 20.0000 mg | ORAL_TABLET | Freq: Every day | ORAL | 0 refills | Status: DC

## 2018-06-26 NOTE — Care Management Important Message (Signed)
Important Message  Patient Details  Name: Brittany [name] MRN: [id number] Date of Birth: 1927/06/24   Medicare Important Message Given:  Yes    Kerin Salen 06/26/2018, 10:20 AMImportant Message  Patient Details  Name: Brittany [name] MRN: [id number] Date of Birth: 1927-06-29   Medicare Important Message Given:  Yes    Kerin Salen 06/26/2018, 10:20 AM

## 2018-06-26 NOTE — Discharge Summary (Addendum)
Brittany [name], is a 85 y.o. female  DOB December 26, 1927  MRN [id number].  Admission date:  06/23/2018  Admitting Physician  Nita Sells, MD  Discharge Date:  06/26/2018   Primary MD  Mayra Neer, MD  Recommendations for primary care physician for things to follow:  - further management per Hospice -Will be discharged on Demadex, daily scheduled, give extra doses as needed for volume overload -Monitor INR and adjust warfarin dose as needed to target INR 2-3  Admission Diagnosis  CAP (community acquired pneumonia) [J18.9] Acute respiratory failure with hypoxia ([place]) [J96.01] Acute on chronic congestive heart failure, unspecified heart failure type ([place]) [I50.9] Heart failure ([place]) [I50.9]   Discharge Diagnosis    Principal Problem:   Acute on chronic respiratory failure with hypoxia ([place]) Active Problems:   Chronic gastritis   H/O aortic valve replacement   Emphysema lung ([place])   Streptococcal bacteremia   Dyspnea   Other cirrhosis of liver ([place])   Left foot drop   COPD with chronic bronchitis ([place])   Acute hypoxemic respiratory failure ([place])   Heart failure ([place])   Palliative care by specialist   DNR (do not resuscitate)      Past Medical History:  Diagnosis Date   Abnormal liver diagnostic imaging    suspected cirrhosis   Arthritis    Atrial fibrillation ([place])    Chronic back pain    Diverticulosis    Dyslipidemia    takes Lipitor daily   Dysrhythmia    Atrial Fibrillation   Foot drop    SINCE 1987   GERD (gastroesophageal reflux disease)    H/O hiatal hernia    Hearing impaired    Bilateral hearing aids   History of bacterial endocarditis    History of gastritis    History of stomach ulcers    many yrs ago   History of TIA (transient ischemic attack)    2013-- RESIDUAL PERIPHERAL VISION RIGHT EYE--  RESOLVED   Hypertension    IC (interstitial  cystitis)    Macular degeneration    Pulmonary hypertension ([place]) 10/2013   Based on TTE   S/P aortic valve replacement    2005   Urine incontinence     Past Surgical History:  Procedure Laterality Date   ABDOMINAL HYSTERECTOMY  1967   AORTIC VALVE REPLACEMENT  09-29-2003    [place] pericardial tissue valve   APPENDECTOMY  1933   BIOPSY  02/01/2018   Procedure: BIOPSY;  Surgeon: Irving Copas., MD;  Location: [facility];  Service: Gastroenterology;;   CARDIAC CATHETERIZATION  07-15-2003 DR HELEN PRESTON   MODERATE TO MODERATELY SEVERE CALCIFIC AORTIC STENOSIS/  NORMAL CORONARY ARTERIES   CARDIOVASCULAR STRESS TEST  01-30-2012  DR NASHER   NORMAL NUCLEAR STUDY/  EF 73%/  NORMAL LVF   CARPAL TUNNEL RELEASE Bilateral    CATARACT EXTRACTION W/ INTRAOCULAR LENS  IMPLANT, BILATERAL     CHOLECYSTECTOMY  1993   CYSTO WITH HYDRODISTENSION N/A 02/05/2013   Procedure: CYSTOSCOPY/HYDRODISTENSION;  Surgeon: Irine Seal,  MD;  Location: [facility];  Service: Urology;  Laterality: N/A;   CYSTO/ HYDRODISTENTION/ INSTILLATION CLORPACTIN  MULTIPLE  last one 2009   DILATION AND CURETTAGE OF UTERUS     ERCP N/A 08/10/2012   Procedure: ENDOSCOPIC RETROGRADE CHOLANGIOPANCREATOGRAPHY (ERCP);  Surgeon: Inda Castle, MD;  Location: [facility];  Service: Gastroenterology;  Laterality: N/A;   ESOPHAGOGASTRODUODENOSCOPY (EGD) WITH PROPOFOL N/A 08/18/2014   Procedure: ESOPHAGOGASTRODUODENOSCOPY (EGD) WITH PROPOFOL;  Surgeon: Inda Castle, MD;  Location: [facility];  Service: Endoscopy;  Laterality: N/A;   ESOPHAGOGASTRODUODENOSCOPY (EGD) WITH PROPOFOL N/A 02/01/2018   Procedure: ESOPHAGOGASTRODUODENOSCOPY (EGD) WITH PROPOFOL;  Surgeon: Rush Landmark Telford Nab., MD;  Location: [facility];  Service: Gastroenterology;  Laterality: N/A;   FOREIGN BODY REMOVAL  02/01/2018   Procedure: FOREIGN BODY REMOVAL;  Surgeon: Rush Landmark Telford Nab., MD;  Location: [facility];  Service: Gastroenterology;;   LEFT HEART CATHETERIZATION WITH CORONARY ANGIOGRAM N/A 10/18/2013   Procedure: LEFT HEART CATHETERIZATION WITH CORONARY ANGIOGRAM;  Surgeon: Jettie Booze, MD;  Location: [facility] LAB;  Service: Cardiovascular;  Laterality: N/A;   [place]  2007   MACROPLASTIQUE URETHRAL IMPLANTATION  08-27-2009   MINI-OPEN RIGHT ROTATOR CUFF REPAIR  07-12-2001   RIGHT SHOULDER ARTHROSCOPY W/ DEBRIDEMENT ROTATOR CUFF AND LABRAL TEAR/ ACROMINOPLASTY/ DISTAL CLAVICLE EXCISION/ CA LIGAMENT RELEASE  10-08-1999   TEE WITHOUT CARDIOVERSION N/A 06/20/2018   Procedure: TRANSESOPHAGEAL ECHOCARDIOGRAM (TEE);  Surgeon: Jerline Pain, MD;  Location: [facility] ENDOSCOPY;  Service: Cardiovascular;  Laterality: N/A;   TONSILLECTOMY AND ADENOIDECTOMY     TRANSTHORACIC ECHOCARDIOGRAM  08-10-2011   MILD LVH/  EF 55-60%/  NORMAL AVR TISSUE/ MILD MR/  MODERATE DILATED LA/  MILD DILATED RA   TRANSVAGINAL TAPE PROCEDURE  07-30-2002   VEIN LIGATION  1969   RIGHT LOWER LEG       History of present illness and  Hospital Course:     Kindly see H&P for history of present illness and admission details, please review complete Labs, Consult reports and Test reports for all details in brief  HPI  from the history and physical done on the day of admission 06/23/2018 89 CF Cirrhosis COPD emphyesema AAA repair CAD with angin s/p RCA DES 10/2013 Chr Afib Chad>4 on coumadin htn Chr L foot drop 1987 Pud gastrittis AVR in 2005 /coumadin Chr L foot ulcer with care given by wound care--  Seen in hospital 3/5-->3/12 started on Vanc and Cefepime--BC x 2 grew grp G strep--TEE no veg  retruns to hospital 3/13-previously on Lasix but unclear if has been taking at home Had some nausea and increasing shortness of breath and more tachypnea No chest pain, diarrhea, chills, [place], fever, dysuria, unilateral weakness, seizure, dark stool, tarry stool, nasal stuffiness She  has had a cough but it is nonproductive  Emergency room work-up as below  ED Course: given ceftriaxone Cefepime, Vancomycin--lasix given--placed on Bipap--Resp viral panel done     Hospital Course   85 y.o.femalewithlast medicalhistory of cirrhosis, chronic AFib, chronic back pain, HTN, gastritis/GERD, pulmonary HTN, AVR 2005 on coumadin, and bacterial endocarditis, with recent hospitalization secondary to sepsis from group C streptococcus bacteremia with left foot cellulitis and ulcer, 06/21/2018, patient presents yesterday with complaints of shortness of breath, she required BiPAP initially, her work-up Significant for volume overload, likely has had diuresis has been stopped on recent discharge, she was admitted for further work-up and diuresis.  Acute on chronic hypoxic respiratory failure -Required BiPAP initially, work-up significant  for volume overload, most likely as her Demadex has been held on recent discharge. -Patient is currently on room air, repeat chest x-ray showing improvement of volume status, IV Lasix 40 mg twice daily, with good diuresis, no signs of volume overload at time of discharge, she will be discharged on torsemide 20 mg oral daily as scheduled, not as needed basis, and if indicated she can take extra dose for volume overload .  Acute on chronic diastolic CHF -Echo on April 2019 significant for EF 60 to 65%, diuresed very well with IV Lasix, euvolemic at time of discharge  Recent Hospitalization with group C streptococcus bacteremia with left foot cellulitis and ulcer -Cellulitis and foot ulcer significantly improved, her work-up during recent admission including for negative TEE, continue with IV Rocephin.  Stop date 06/29/2018  Chronic atrial fibrillation:  - Rate controlled, continueddiltiazemand Coumadin   History of brioprosthetic AVR: - Mild stenosis on most recent echo. Continue coumadin with INR goal 2-3,  Pulmonary HTN,  emphysema: Patientwill have follow-upwith pulmonology, Dr. Lamonte Sakai 4/14.Currently stable  Left foot drop:s/p orthopedic procedure 1987.   Hyponatremia: -In the setting of volume overload, improved with IV diuresis  Dyslipidemia:  - Continue statin  GERD:  - PPI  Hypertension -Blood pressure is soft during hospital stay, start hydralazine olmesartan on discharge, she will be discharged only on Cardizem for heart rate control  Discharge Condition:  D/C to Hospice  Goals of care: -Patient requested palliative care consult for hospice service, as reports she has been through a lot, with palliative medicine, confirmed DNR CODE STATUS, and discharged with hospice care has been arranged, she will be discharged on PRN morphine and Valium  Follow UP  Follow-up Information    [facility] Follow up.   Why:  Home nursing visit tomorrow 06/27/18. Contact information: [place] [address] [phone]            Discharge Instructions  and  Discharge Medications    Discharge Instructions    Discharge instructions   Complete by:  As directed    Follow with Primary MD Mayra Neer, MD in 7 days    Activity: As tolerated with Full fall precautions use walker/cane & assistance as needed   Disposition Home    Diet: Heart Healthy , with feeding assistance and aspiration precautions.  For Heart failure patients - Check your Weight same time everyday, if you gain over 2 pounds, or you develop in leg swelling, experience more shortness of breath or chest pain, call your Primary MD immediately. Follow Cardiac Low Salt Diet and 1.5 lit/day fluid restriction.   On your next visit with your primary care physician please Get Medicines reviewed and adjusted.   Please request your Prim.MD to go over all Hospital Tests and Procedure/Radiological results at the follow up, please get all Hospital records sent to your Prim MD by signing  hospital release before you go home.   If you experience worsening of your admission symptoms, develop shortness of breath, life threatening emergency, suicidal or homicidal thoughts you must seek medical attention immediately by calling 911 or calling your MD immediately  if symptoms less severe.  You Must read complete instructions/literature along with all the possible adverse reactions/side effects for all the Medicines you take and that have been prescribed to you. Take any new Medicines after you have completely understood and accpet all the possible adverse reactions/side effects.   Do not drive, operating heavy machinery, perform activities at heights, swimming or  participation in water activities or provide baby sitting services if your were admitted for syncope or siezures until you have seen by Primary MD or a Neurologist and advised to do so again.  Do not drive when taking Pain medications.    Do not take more than prescribed Pain, Sleep and Anxiety Medications  Special Instructions: If you have smoked or chewed Tobacco  in the last 2 yrs please stop smoking, stop any regular Alcohol  and or any Recreational drug use.  Wear Seat belts while driving.   Please note  You were cared for by a hospitalist during your hospital stay. If you have any questions about your discharge medications or the care you received while you were in the hospital after you are discharged, you can call the unit and asked to speak with the hospitalist on call if the hospitalist that took care of you is not available. Once you are discharged, your primary care physician will handle any further medical issues. Please note that NO REFILLS for any discharge medications will be authorized once you are discharged, as it is imperative that you return to your primary care physician (or establish a relationship with a primary care physician if you do not have one) for your aftercare needs so that they can reassess  your need for medications and monitor your lab values.   Increase activity slowly   Complete by:  As directed      Allergies as of 06/26/2018      Reactions   Amlodipine Swelling, Other (See Comments)    Unspecified swelling reaction   Amoxicillin Diarrhea, Nausea And Vomiting, Other (See Comments)   Has patient had a PCN reaction causing immediate rash, facial/tongue/throat swelling, SOB or lightheadedness with hypotension: No Has patient had a PCN reaction causing severe rash involving mucus membranes or skin necrosis: No Has patient had a PCN reaction that required hospitalization: No Has patient had a PCN reaction occurring within the last 10 years: No If all of the above answers are "NO", then may proceed with Cephalosporin use.   Carafate [sucralfate] Swelling, Other (See Comments)   Reaction:  Knee swelling/redness    Cephalexin Diarrhea   Codeine Nausea And Vomiting   Irbesartan Swelling, Other (See Comments)   Reaction:  Facial/hand swelling and numbness    Morphine And Related Other (See Comments)   Pt states that she has a history of addiction with this medication.       Neurontin [gabapentin] Swelling, Other (See Comments)   Reaction:  Leg swelling    Nitrofurantoin Monohyd Macro Diarrhea, Nausea And Vomiting   Prednisone Other (See Comments)   Reaction:  Elevated BP   Sulfa Antibiotics Rash      Medication List    STOP taking these medications   isosorbide mononitrate 30 MG 24 hr tablet Commonly known as:  IMDUR   lisinopril 20 MG tablet Commonly known as:  PRINIVIL,ZESTRIL     TAKE these medications   acetaminophen 325 MG tablet Commonly known as:  TYLENOL Take 650 mg by mouth daily as needed for mild pain.   atorvastatin 20 MG tablet Commonly known as:  LIPITOR Take 20 mg by mouth daily.   busPIRone 15 MG tablet Commonly known as:  BUSPAR Take 1 tablet (15 mg total) by mouth 2 (two) times daily.   cefTRIAXone  IVPB Commonly known as:   ROCEPHIN Inject 2 g into the vein daily. Indication:  Group G streptococcal bacteremia Last Day of Therapy:  06/29/18  Labs - Once weekly:  CBC/D and BMP, Labs - Every other week:  ESR and CRP   cycloSPORINE 0.05 % ophthalmic emulsion Commonly known as:  RESTASIS Place 1 drop into both eyes 2 (two) times daily.   diazepam 2 MG tablet Commonly known as:  Valium Take 1 tablet (2 mg total) by mouth at bedtime as needed for anxiety.   diltiazem 120 MG tablet Commonly known as:  CARDIZEM Take 120 mg by mouth 2 (two) times daily.   HYDROcodone-acetaminophen 5-325 MG tablet Commonly known as:  NORCO/VICODIN Take 1 tablet by mouth every 6 (six) hours as needed for severe pain.   ICaps Areds 2 Caps Take 1 capsule by mouth 2 (two) times daily.   morphine 20 MG/5ML solution Take 0.6 mLs (2.4 mg total) by mouth every 4 (four) hours as needed (dyspnea).   nitroGLYCERIN 0.4 MG SL tablet Commonly known as:  NITROSTAT Place 1 tablet (0.4 mg total) under the tongue every 5 (five) minutes as needed for chest pain.   ondansetron 8 MG tablet Commonly known as:  Zofran Take 1 tablet (8 mg total) by mouth 2 (two) times daily. What changed:    when to take this  reasons to take this   oxybutynin 5 MG tablet Commonly known as:  DITROPAN Take 5 mg by mouth daily.   pantoprazole 40 MG tablet Commonly known as:  PROTONIX Take 40 mg by mouth 2 (two) times daily.   REFRESH OP Place 1 drop into both eyes 2 (two) times daily.   torsemide 20 MG tablet Commonly known as:  DEMADEX Take 1 tablet (20 mg total) by mouth daily. What changed:    when to take this  reasons to take this   warfarin 5 MG tablet Commonly known as:  COUMADIN Take as directed. If you are unsure how to take this medication, talk to your nurse or doctor. Original instructions:  Take as directed by [facility] What changed:    how much to take  how to take this  when to take this  additional instructions          Diet and Activity recommendation: See Discharge Instructions above   Consults obtained -  palliative   Major procedures and Radiology Reports - PLEASE review detailed and final reports for all details, in brief -      Dg Chest 2 View  Result Date: 06/24/2018 CLINICAL DATA:  Shortness of breath, pleural effusions EXAM: CHEST - 2 VIEW COMPARISON:  06/23/2018 FINDINGS: Pulmonary vascular congestion with possible mild interstitial edema, improved. Small bilateral pleural effusions, stable versus mildly decreased. Associated left basilar atelectasis. No pneumothorax. Right arm PICC terminates in the mid SVC. Median sternotomy. IMPRESSION: Cardiomegaly with mild interstitial edema and small bilateral pleural effusions, mildly improved. Electronically Signed   By: Julian Hy M.D.   On: 06/24/2018 08:50   Dg Chest 2 View  Result Date: 06/14/2018 CLINICAL DATA:  Cough for 3 days COMPARISON:  Radiograph 05/08/2018 FINDINGS: Sternotomy wires overlie enlarged cardiac silhouette. Small pleural effusion seen on lateral projection. Upper lungs are clear. No focal consolidation. Atherosclerotic calcification of the aorta. IMPRESSION: Cardiomegaly and bilateral effusions. Findings suggest congestive heart failure Electronically Signed   By: Suzy Bouchard M.D.   On: 06/14/2018 10:39   Dg Lumbar Spine Complete  Result Date: 06/14/2018 CLINICAL DATA:  Post unwitnessed fall with low back pain. EXAM: LUMBAR SPINE - COMPLETE 4+ VIEW COMPARISON:  None. FINDINGS: There is no evidence of lumbar spine  fracture. Alignment is normal. Multilevel osteoarthritic changes with disc space narrowing, subchondral sclerosis, osteophyte formation. Calcific atherosclerotic disease of the aorta. IMPRESSION: 1. No acute fracture or dislocation identified about the lumbosacral spine. 2. Multilevel osteoarthritic changes, moderate. Electronically Signed   By: Fidela Salisbury M.D.   On: 06/14/2018 13:32   Dg  Chest Port 1 View  Result Date: 06/23/2018 CLINICAL DATA:  Initial evaluation for acute shortness of breath. EXAM: PORTABLE CHEST 1 VIEW COMPARISON:  Prior radiograph from 05/18/2018. FINDINGS: Median sternotomy wires with underlying valvular prosthesis noted, stable. Cardiomegaly unchanged. Mediastinal silhouette normal. Aortic atherosclerosis. Right-sided PICC catheter remains in place with tip overlying the cavoatrial junction, stable. Lungs normally inflated. Moderate layering bilateral pleural effusions. Underlying moderate diffuse pulmonary interstitial edema. Associated bibasilar confluent opacities favored to reflect atelectasis and/or edema. No other consolidative opacity. No pneumothorax. No acute osseous finding.  Osteopenia noted. IMPRESSION: 1. Cardiomegaly with small layering bilateral pleural effusions and moderate diffuse pulmonary edema, suggesting CHF. 2. Superimposed confluent bibasilar opacities, favored to reflect atelectasis and/or effusion. Superimposed infiltrates would be difficult to exclude, and could be considered in the correct clinical setting. Electronically Signed   By: Jeannine Boga M.D.   On: 06/23/2018 03:27   Dg Chest Port 1 View  Result Date: 06/16/2018 CLINICAL DATA:  Evaluate PICC line placement. EXAM: PORTABLE CHEST 1 VIEW COMPARISON:  June 14, 2018 FINDINGS: A right PICC line terminates in the central SVC. Bilateral pleural effusions, right greater than left, are more pronounced on this frontal view compared to the previous frontal view, likely due to difference in positioning and layering of the effusions. Cardiomegaly. The hila and mediastinum are normal. No pneumothorax. No nodules or masses. IMPRESSION: 1. The right PICC line is in good position. 2. Bilateral pleural effusions, right greater than left, with under lying atelectasis. Electronically Signed   By: Dorise Bullion III M.D   On: 06/16/2018 18:28   Dg Foot 2 Views Left  Result Date:  06/14/2018 CLINICAL DATA:  Initial evaluation for nonhealing wound at lateral left foot. Recent trauma with fall. EXAM: LEFT FOOT - 2 VIEW COMPARISON:  Prior radiograph from 01/01/2012 FINDINGS: No acute fracture or dislocation. Fixation screws in place at the left third and fourth digits without complication. Scattered osteoarthritic changes present about the foot with underlying osteopenia. Soft tissue swelling overlies the lateral aspect of the base of the left fifth metatarsal, likely related to overlying ulcer/wound. No evidence for acute osteomyelitis. No dissecting soft tissue emphysema or other worrisome feature. Vascular calcifications noted within the lower leg. IMPRESSION: 1. Soft tissue ulceration/wound overlying the base of the left fifth metatarsal. No radiographic evidence for acute osteomyelitis. 2. No other acute osseous abnormality about the left foot. 3. Postoperative changes at the left third and fourth digits without adverse features. 4. Advanced osteopenia. Electronically Signed   By: Jeannine Boga M.D.   On: 06/14/2018 13:39   [place] Ekg Site Rite  Result Date: 06/16/2018 If Site Rite image not attached, placement could not be confirmed due to current cardiac rhythm.   Micro Results     Recent Results (from the past 240 hour(s))  Culture, blood (routine x 2)     Status: None   Collection Time: 06/17/18  4:03 AM  Result Value Ref Range Status   Specimen Description   Final    BLOOD LEFT ANTECUBITAL Performed at [facility] [address], [place], [place] [address]    Special Requests   Final  BOTTLES DRAWN AEROBIC AND ANAEROBIC Blood Culture adequate volume Performed at [facility] [address], [place], [place] [address]    Culture   Final    NO GROWTH 5 DAYS Performed at [facility], [place] [address], [place], [place] [address]    Report Status 06/22/2018 FINAL  Final  Culture, blood (routine x 2)     Status:  None   Collection Time: 06/17/18  4:03 AM  Result Value Ref Range Status   Specimen Description   Final    BLOOD LEFT ARM Performed at [facility], [place] [address] [place], [place] [address]    Special Requests   Final    BOTTLES DRAWN AEROBIC ONLY Blood Culture adequate volume Performed at [facility] [address] [place], [place] [address]    Culture   Final    NO GROWTH 5 DAYS Performed at [facility], [place] [address], [place], [place] [address]    Report Status 06/22/2018 FINAL  Final  Culture, blood (Routine X 2) w Reflex to ID Panel     Status: None (Preliminary result)   Collection Time: 06/23/18  3:37 AM  Result Value Ref Range Status   Specimen Description   Final    BLOOD RIGHT ARM Performed at [facility], [place] [address] [place], [place] [address]    Special Requests   Final    BOTTLES DRAWN AEROBIC AND ANAEROBIC Blood Culture results may not be optimal due to an excessive volume of blood received in culture bottles Performed at [facility] [address], [place], [place] [address]    Culture   Final    NO GROWTH 3 DAYS Performed at [facility], [place] [address] [place], [place] [address]    Report Status PENDING  Incomplete  Culture, blood (Routine X 2) w Reflex to ID Panel     Status: None (Preliminary result)   Collection Time: 06/23/18  3:49 AM  Result Value Ref Range Status   Specimen Description   Final    BLOOD LEFT HAND Performed at [facility] [address], [place], [place] [address]    Special Requests   Final    BOTTLES DRAWN AEROBIC AND ANAEROBIC Blood Culture adequate volume Performed at [facility] [address] [place], [place] [address]    Culture   Final    NO GROWTH 3 DAYS Performed at [facility], [place] [address], [place],  [address]    Report Status PENDING  Incomplete  Respiratory Panel by PCR     Status:  None   Collection Time: 06/23/18  5:43 AM  Result Value Ref Range Status   Adenovirus NOT DETECTED NOT DETECTED Final   Coronavirus 229E NOT DETECTED NOT DETECTED Final    Comment: (NOTE) The Coronavirus on the Respiratory Panel, DOES NOT test for the novel  Coronavirus (2019 nCoV)    Coronavirus HKU1 NOT DETECTED NOT DETECTED Final   Coronavirus NL63 NOT DETECTED NOT DETECTED Final   Coronavirus OC43 NOT DETECTED NOT DETECTED Final   Metapneumovirus NOT DETECTED NOT DETECTED Final   Rhinovirus / Enterovirus NOT DETECTED NOT DETECTED Final   Influenza A NOT DETECTED NOT DETECTED Final   Influenza B NOT DETECTED NOT DETECTED Final   Parainfluenza Virus 1 NOT DETECTED NOT DETECTED Final   Parainfluenza Virus 2 NOT DETECTED NOT DETECTED Final   Parainfluenza Virus 3  NOT DETECTED NOT DETECTED Final   Parainfluenza Virus 4 NOT DETECTED NOT DETECTED Final   Respiratory Syncytial Virus NOT DETECTED NOT DETECTED Final   Bordetella pertussis NOT DETECTED NOT DETECTED Final   Chlamydophila pneumoniae NOT DETECTED NOT DETECTED Final   Mycoplasma pneumoniae NOT DETECTED NOT DETECTED Final    Comment: Performed at [facility], [place] [address], [place], [place] [address]       Today   Subjective:   Ameirah Khatoon today has no headache,no chest or abdominal pain, reports generalized weakness, fatigue. Objective:   Blood pressure (!) 94/57, pulse 72, temperature 98.3 F (36.8 C), temperature source Oral, resp. rate 18, height '5\' 1"'$  (1.549 m), weight 57.2 kg, SpO2 97 %.   Intake/Output Summary (Last 24 hours) at 06/26/2018 1353 Last data filed at 06/26/2018 1226 Gross per 24 hour  Intake 860 ml  Output 3950 ml  Net -3090 ml    Exam Awake Alert, Oriented x 3, extremely frail Symmetrical Chest wall movement, Good air movement bilaterally, no wheezing RRR,No Gallops,Rubs or new Murmurs, No Parasternal Heave +ve B.Sounds, Abd Soft, Non tender,  No rebound -guarding or rigidity. No  Cyanosis, Clubbing or edema, No new Rash or bruise  Data Review   CBC w Diff:  Lab Results  Component Value Date   WBC 10.6 (H) 06/26/2018   HGB 8.8 (L) 06/26/2018   HCT 27.4 (L) 06/26/2018   PLT 416 (H) 06/26/2018   LYMPHOPCT 8 06/23/2018   MONOPCT 8 06/23/2018   EOSPCT 2 06/23/2018   BASOPCT 0 06/23/2018    CMP:  Lab Results  Component Value Date   NA 130 (L) 06/26/2018   NA 133 (L) 08/07/2017   K 4.0 06/26/2018   CL 89 (L) 06/26/2018   CO2 32 06/26/2018   BUN 30 (H) 06/26/2018   BUN 26 08/07/2017   CREATININE 0.76 06/26/2018   CREATININE 0.98 (H) 02/01/2016   PROT 7.2 06/23/2018   ALBUMIN 2.7 (L) 06/23/2018   BILITOT 0.6 06/23/2018   ALKPHOS 119 06/23/2018   AST 32 06/23/2018   ALT 26 06/23/2018  .   Total Time in preparing paper work, data evaluation and todays exam - 50 minutes  Phillips Climes M.D on 06/26/2018 at [facility]   Office  [phone]

## 2018-06-26 NOTE — Progress Notes (Signed)
Manufacturing engineer [facility])  Received referral from Case Manager Juliann Pulse for home with hospice services after discharge later today.  Chart and patient reviewed by [facility] physician and pt is eligible for hospice services at home.  Spoke with daughter, she was at home and ask that we not engage with the patient at the bedside.  Per daughter, she was "having a good day" and did not need for [place] to see her.    Plan is to discharge later today to the daughters home.  No DME needs identified at this time.  Pt has an automatic bed at home and daughter feels this is what is best for her now.  Please fax d/c summary to:  [phone]  Please notify [facility] once pt leaves the unit at:  [phone] M-F [phone], if after 5 call [phone]  Information given to daughter.  Please call with any questions or concerns.  Venia Carbon RN, BSN, [place] [facility] Liaison  [phone]

## 2018-06-26 NOTE — Progress Notes (Signed)
Patient is stable for discharge. Discharge instructions and medications have been reviewed with the patient and all questions answered. AVS and prescriptions given to patient. PICC line flushed and capped by IV Team RN. Patient to complete course of IV antibiotics at home.   Noura Purpura, Fraser Din 06/26/2018 3:17 PM

## 2018-06-26 NOTE — Progress Notes (Signed)
[place] for: Warfarin Indication: atrial fibrillation in setting of bioprosthetic AVR  Allergies  Allergen Reactions  . Amlodipine Swelling and Other (See Comments)     Unspecified swelling reaction  . Amoxicillin Diarrhea, Nausea And Vomiting and Other (See Comments)    Has patient had a PCN reaction causing immediate rash, facial/tongue/throat swelling, SOB or lightheadedness with hypotension: No Has patient had a PCN reaction causing severe rash involving mucus membranes or skin necrosis: No Has patient had a PCN reaction that required hospitalization: No Has patient had a PCN reaction occurring within the last 10 years: No If all of the above answers are "NO", then may proceed with Cephalosporin use.  . Carafate [Sucralfate] Swelling and Other (See Comments)    Reaction:  Knee swelling/redness   . Cephalexin Diarrhea  . Codeine Nausea And Vomiting  . Irbesartan Swelling and Other (See Comments)    Reaction:  Facial/hand swelling and numbness   . Morphine And Related Other (See Comments)    Pt states that she has a history of addiction with this medication.      . Neurontin [Gabapentin] Swelling and Other (See Comments)    Reaction:  Leg swelling   . Nitrofurantoin Monohyd Macro Diarrhea and Nausea And Vomiting  . Prednisone Other (See Comments)    Reaction:  Elevated BP  . Sulfa Antibiotics Rash    Patient Measurements: Height: 5\' 1"  (154.9 cm) Weight: 126 lb (57.2 kg) IBW/kg (Calculated) : 47.8   Vital Signs: Temp: 98.5 F (36.9 C) (03/17 0438) Temp Source: Oral (03/17 0438) BP: 98/64 (03/17 0438) Pulse Rate: 72 (03/17 0438)  Labs: Recent Labs    06/24/18 0422 06/24/18 1900 06/25/18 0344 06/26/18 0436  HGB  --   --   --  8.8*  HCT  --   --   --  27.4*  PLT  --   --   --  416*  LABPROT 28.4*  --  23.6* 20.6*  INR 2.7*  --  2.1* 1.8*  CREATININE 0.64  --  0.80 0.76  TROPONINI  --  <0.03  --   --     Estimated  Creatinine Clearance: 36 mL/min (by C-G formula based on SCr of 0.76 mg/dL).   Medical History: Past Medical History:  Diagnosis Date  . Abnormal liver diagnostic imaging    suspected cirrhosis  . Arthritis   . Atrial fibrillation ([place])   . Chronic back pain   . Diverticulosis   . Dyslipidemia    takes Lipitor daily  . Dysrhythmia    Atrial Fibrillation  . Foot drop    SINCE 1987  . GERD (gastroesophageal reflux disease)   . H/O hiatal hernia   . Hearing impaired    Bilateral hearing aids  . History of bacterial endocarditis   . History of gastritis   . History of stomach ulcers    many yrs ago  . History of TIA (transient ischemic attack)    2013-- RESIDUAL PERIPHERAL VISION RIGHT EYE--  RESOLVED  . Hypertension   . IC (interstitial cystitis)   . Macular degeneration   . Pulmonary hypertension ([place]) 10/2013   Based on TTE  . S/P aortic valve replacement    2005  . Urine incontinence     Medications:  Scheduled:  . atorvastatin  20 mg Oral Daily  . busPIRone  15 mg Oral BID  . cycloSPORINE  1 drop Both Eyes BID  . diazepam  2 mg Oral  QHS  . diltiazem  120 mg Oral BID  . furosemide  40 mg Intravenous BID  . isosorbide mononitrate  15 mg Oral Daily  . lisinopril  20 mg Oral BID  . multivitamin  1 tablet Oral BID  . oxybutynin  5 mg Oral Daily  . pantoprazole  40 mg Oral BID  . sodium chloride flush  3 mL Intravenous Q12H  . Warfarin - Pharmacist Dosing Inpatient   Does not apply q1800   Infusions:  . sodium chloride    . cefTRIAXone (ROCEPHIN)  IV 2 g (06/25/18 1141)   PRN: sodium chloride, acetaminophen, alum & mag hydroxide-simeth, HYDROcodone-acetaminophen, morphine injection, nitroGLYCERIN, ondansetron (ZOFRAN) IV, ondansetron, sodium chloride flush, sodium chloride flush  Assessment: 85 y/o F on chronic warfarin for atrial fibrillation in setting of bioprosthetic AVR, admitted with acute on chronic respiratory failure and likely decompensated heart  failure.  Orders received to continue warfarin with pharmacy dosing assistance.   Baseline INR: 2.7  Prior anticoagulation: PTA warfarin dose 5 mg daily except 2.5 mg on T/Th, LD 3/13 @1800   Significant events: none  Goal of Therapy:  INR 2 - 3  Today, 06/26/18  CBC: Hgb low but consistent with recent admission, PLTs slightly high  INR subtherapeutic, continuing to trend down  Major drug interactions:None; CTX may enhance anticoagulation effects  Improved PO intake  Plan:   Warfarin 5 mg PO today at 1800; would continue home regimen at discharge since patient does have some risk factors for elevated INR but is also currently subtherapeutic  Daily INR  CBC at least q72 hr while on warfarin  Monitor for signs of bleeding or thrombosis   Tyna Jaksch, PharmD Candidate 06/26/2018 10:33 AM

## 2018-06-26 NOTE — TOC Transition Note (Signed)
Transition of Care [facility]) - CM/SW Discharge Note   Patient Details  Name: Brittany [name] MRN: [id number] Date of Birth: 01/26/1928  Transition of Care [facility]) CM/SW Contact:  Dessa Phi, RN Phone Number: 06/26/2018, 12:36 PM   Clinical Narrative:  [name] to [facility] rep [name]-has accepted-will make home visit tomorrow-per daughter's request. Daughter [name] agree to d/c plan-d/c today home w/hospice. They have all dme needed already. No further CM needs.     Final next level of care: Home w Hospice Care Barriers to Discharge: No Barriers Identified   Patient Goals and CMS Choice Patient states their goals for this hospitalization and ongoing recovery are:: (go home) CMS Medicare.gov Compare Post Acute Care list provided to:: Patient Choice offered to / list presented to : Patient, Adult Children  Discharge Placement                       Discharge Plan and Services                  HH Arranged: RN [facility] Agency: Hospice and [place]   Social Determinants of Health (SDOH) Interventions     Readmission Risk Interventions Readmission Risk Prevention Plan 06/26/2018 06/21/2018  Transportation Screening Complete Complete  Medication Review Press photographer) Complete Complete  PCP or Specialist appointment within 3-5 days of discharge Complete Complete  HRI or [place] Complete Complete  SW Recovery Care/Counseling Consult Complete Complete  Palliative Care Screening Complete Not [place] Not Applicable Not Applicable  Some recent data might be hidden

## 2018-06-26 NOTE — Discharge Instructions (Signed)
Follow with Primary MD Mayra Neer, MD in 7 days    Activity: As tolerated with Full fall precautions use walker/cane & assistance as needed   Disposition Home    Diet: Heart Healthy , with feeding assistance and aspiration precautions.  For Heart failure patients - Check your Weight same time everyday, if you gain over 2 pounds, or you develop in leg swelling, experience more shortness of breath or chest pain, call your Primary MD immediately. Follow Cardiac Low Salt Diet and 1.5 lit/day fluid restriction.   On your next visit with your primary care physician please Get Medicines reviewed and adjusted.   Please request your Prim.MD to go over all Hospital Tests and Procedure/Radiological results at the follow up, please get all Hospital records sent to your Prim MD by signing hospital release before you go home.   If you experience worsening of your admission symptoms, develop shortness of breath, life threatening emergency, suicidal or homicidal thoughts you must seek medical attention immediately by calling 911 or calling your MD immediately  if symptoms less severe.  You Must read complete instructions/literature along with all the possible adverse reactions/side effects for all the Medicines you take and that have been prescribed to you. Take any new Medicines after you have completely understood and accpet all the possible adverse reactions/side effects.   Do not drive, operating heavy machinery, perform activities at heights, swimming or participation in water activities or provide baby sitting services if your were admitted for syncope or siezures until you have seen by Primary MD or a Neurologist and advised to do so again.  Do not drive when taking Pain medications.    Do not take more than prescribed Pain, Sleep and Anxiety Medications  Special Instructions: If you have smoked or chewed Tobacco  in the last 2 yrs please stop smoking, stop any regular Alcohol  and or any  Recreational drug use.  Wear Seat belts while driving.   Please note  You were cared for by a hospitalist during your hospital stay. If you have any questions about your discharge medications or the care you received while you were in the hospital after you are discharged, you can call the unit and asked to speak with the hospitalist on call if the hospitalist that took care of you is not available. Once you are discharged, your primary care physician will handle any further medical issues. Please note that NO REFILLS for any discharge medications will be authorized once you are discharged, as it is imperative that you return to your primary care physician (or establish a relationship with a primary care physician if you do not have one) for your aftercare needs so that they can reassess your need for medications and monitor your lab values.

## 2018-06-26 NOTE — Progress Notes (Signed)
Patient ID: Brittany [name], female   DOB: July 29, 1927, 85 y.o.   MRN: [id number]  This NP visited patient at the bedside as a follow up to  yesterday's [place].  Patient is alert and oriented and out of bed to the chair having breakfast.  Patient tells me she is appreciative for yesterday's conversation including her children and and helping all understand her overall goals of care.  Plan is to disposition home with hospice services and begin to shift on a more supportive approach to care allowing a natural course.  Discussed with patient the importance of continued conversation with her family and her  medical providers regarding overall plan of care and treatment options,  ensuring decisions are within the context of the patients values and GOCs.  Questions and concerns addressed   Discussed with Dr Landis Gandy  Total time spent on the unit was 15 miutes  Greater than 50% of the time was spent in counseling and coordination of care  Wadie Lessen NP  Palliative Medicine Team Team Phone # [phone] Pager [phone]

## 2018-06-26 NOTE — TOC Initial Note (Signed)
Transition of Care [facility]) - Initial/Assessment Note    Patient Details  Name: Brittany [name] MRN: [id number] Date of Birth: 1927/08/13  Transition [facility]) CM/SW Contact:    Dessa Phi, RN Phone Number: 06/26/2018, 9:25 AM  Clinical Narrative: DNR-in shadow chart for MD signature.Spoke to patient, & dtr on speaker phne per patient request-Brittany [name] [phone]-PZWC home w/hospice-chose [facility] care-rep Anderson [place] contacted for eval-await acceptance. pcp-Dr. Serita Grammes, patient has dme-rw,shower chair. Patient wills tay w/dtr Brittany [name] @ d/c address:[address] [place] [phone].Dtr will transport home on own. Await acceptance.                        Patient Goals and CMS Choice-get better.        Expected Discharge Plan and Services Home w/hospice                                  Prior Living Arrangements/Services-[facility] [place]                       Activities of Daily Living Home Assistive Devices/Equipment: Gilford Rile (specify type) ADL Screening (condition at time of admission) Patient's cognitive ability adequate to safely complete daily activities?: No Is the patient deaf or have difficulty hearing?: Yes Does the patient have difficulty seeing, even when wearing glasses/contacts?: No Does the patient have difficulty concentrating, remembering, or making decisions?: Yes Patient able to express need for assistance with ADLs?: Yes Does the patient have difficulty dressing or bathing?: Yes Independently performs ADLs?: No Does the patient have difficulty walking or climbing stairs?: Yes Weakness of Legs: Both Weakness of Arms/Hands: Both  Permission Sought/Granted                  Emotional Assessment              Admission diagnosis:  CAP (community acquired pneumonia) [J18.9] Acute respiratory failure with hypoxia ([place]) [J96.01] Acute on chronic congestive heart failure, unspecified heart failure type ([place]) [I50.9] Heart  failure ([place]) [I50.9] Patient Active Problem List   Diagnosis Date Noted  . Palliative care by specialist   . DNR (do not resuscitate)   . Dyspnea 06/23/2018  . Other cirrhosis of liver ([place]) 06/23/2018  . Left foot drop 06/23/2018  . Acute on chronic respiratory failure with hypoxia ([place]) 06/23/2018  . COPD with chronic bronchitis ([place]) 06/23/2018  . Acute hypoxemic respiratory failure ([place]) 06/23/2018  . Heart failure ([place]) 06/23/2018  . Streptococcal bacteremia   . Sepsis secondary to UTI ([place]) 06/14/2018  . Acute metabolic encephalopathy   . Acute lower UTI   . Confusion 05/04/2018  . Dementia ([place]) 05/04/2018  . Chronic ulcer of left foot ([place]) 05/04/2018  . CKD (chronic kidney disease), stage III ([place]) 01/30/2018  . H/O hiatal hernia   . Allergic rhinitis 06/06/2017  . Mediastinal lymphadenopathy 06/06/2017  . Encounter for therapeutic drug monitoring 10/04/2016  . Colitis 09/29/2016  . Abdominal pain 09/29/2016  . Diarrhea 09/29/2016  . Chronic back pain 09/29/2016  . Dyspepsia 04/14/2016  . Cough 12/02/2014  . Emphysema lung ([place]) 12/02/2014  . Oral thrush 12/02/2014  . Multiple lung nodules on CT 10/07/2014  . Nausea without vomiting 09/30/2014  . Hyponatremia 08/27/2014  . Nausea & vomiting 08/27/2014  . Anemia 02/06/2014  . Perioral numbness 10/19/2013  . Headache(784.0) 10/12/2013  . Chronic diastolic heart failure ([place])  10/12/2013  . Atelectasis 10/12/2013  . Pulmonary hypertension ([place]) 10/12/2013  . Cephalalgia   . Dizziness 10/11/2013  . Headache 10/11/2013  . S/P AVR (aortic valve replacement) 11/19/2012  . Abdominal pain, periumbilic 17/61/6073  . 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 Calculus of bile duct without mention of cholecystitis or obstruction 08/10/2012  . Chronic gastritis 07/16/2012  . Nausea alone 05/21/2012  . Abdominal pain, epigastric 05/21/2012  . Cellulitis of foot 01/01/2012  . Dysphagia, unspecified(787.20) 12/26/2011  .  Esophageal reflux 12/26/2011  . Dyspnea on exertion 11/18/2011  . Chronic atrial fibrillation ([place]) 11/07/2011  . Chest pain 06/06/2011  . Aortic stenosis 03/24/2011  . Hypertension 03/24/2011  . H/O aortic valve replacement 10/02/2003   PCP:  Mayra Neer, MD Pharmacy:   [facility] [address] [place] [address] [place] [address] Phone: [phone] Fax: [phone]     Social Determinants of Health ([place]) Interventions    Readmission Risk Interventions 30 Day Unplanned Readmission Risk Score     ED to Hosp-Admission (Current) from 06/23/2018 in [place]  30 Day Unplanned Readmission Risk Score (%)  39 Filed at 06/26/2018 0801     This score is the patient's risk of an unplanned readmission within 30 days of being discharged (0 -100%). The score is based on dignosis, age, lab data, medications, orders, and past utilization.   Low:  0-14.9   Medium: 15-21.9   High: 22-29.9   Extreme: 30 and above       Readmission Risk Prevention Plan 06/21/2018  Transportation Screening Complete  Medication Review Press photographer) Complete  PCP or Specialist appointment within 3-5 days of discharge Complete  HRI or [place] Complete  SW Recovery Care/Counseling Consult Complete  [place] Not Applicable  Some recent data might be hidden

## 2018-06-27 DIAGNOSIS — I5030 Unspecified diastolic (congestive) heart failure: Secondary | ICD-10-CM | POA: Diagnosis not present

## 2018-06-27 DIAGNOSIS — Z8679 Personal history of other diseases of the circulatory system: Secondary | ICD-10-CM | POA: Diagnosis not present

## 2018-06-27 DIAGNOSIS — J9620 Acute and chronic respiratory failure, unspecified whether with hypoxia or hypercapnia: Secondary | ICD-10-CM | POA: Diagnosis not present

## 2018-06-27 DIAGNOSIS — B954 Other streptococcus as the cause of diseases classified elsewhere: Secondary | ICD-10-CM | POA: Diagnosis not present

## 2018-06-27 DIAGNOSIS — I509 Heart failure, unspecified: Secondary | ICD-10-CM

## 2018-06-27 DIAGNOSIS — I270 Primary pulmonary hypertension: Secondary | ICD-10-CM | POA: Diagnosis not present

## 2018-06-27 DIAGNOSIS — J449 Chronic obstructive pulmonary disease, unspecified: Secondary | ICD-10-CM | POA: Diagnosis not present

## 2018-06-27 DIAGNOSIS — L03116 Cellulitis of left lower limb: Secondary | ICD-10-CM | POA: Diagnosis not present

## 2018-06-27 DIAGNOSIS — K219 Gastro-esophageal reflux disease without esophagitis: Secondary | ICD-10-CM | POA: Diagnosis not present

## 2018-06-27 DIAGNOSIS — I25119 Atherosclerotic heart disease of native coronary artery with unspecified angina pectoris: Secondary | ICD-10-CM | POA: Diagnosis not present

## 2018-06-27 DIAGNOSIS — M21372 Foot drop, left foot: Secondary | ICD-10-CM | POA: Diagnosis not present

## 2018-06-27 DIAGNOSIS — Z954 Presence of other heart-valve replacement: Secondary | ICD-10-CM | POA: Diagnosis not present

## 2018-06-27 DIAGNOSIS — I110 Hypertensive heart disease with heart failure: Secondary | ICD-10-CM | POA: Diagnosis not present

## 2018-06-27 DIAGNOSIS — R7881 Bacteremia: Secondary | ICD-10-CM | POA: Diagnosis not present

## 2018-06-27 DIAGNOSIS — I482 Chronic atrial fibrillation, unspecified: Secondary | ICD-10-CM | POA: Diagnosis not present

## 2018-06-27 DIAGNOSIS — D638 Anemia in other chronic diseases classified elsewhere: Secondary | ICD-10-CM | POA: Diagnosis not present

## 2018-06-27 DIAGNOSIS — R531 Weakness: Secondary | ICD-10-CM

## 2018-06-28 DIAGNOSIS — J9620 Acute and chronic respiratory failure, unspecified whether with hypoxia or hypercapnia: Secondary | ICD-10-CM | POA: Diagnosis not present

## 2018-06-28 DIAGNOSIS — I270 Primary pulmonary hypertension: Secondary | ICD-10-CM | POA: Diagnosis not present

## 2018-06-28 DIAGNOSIS — I25119 Atherosclerotic heart disease of native coronary artery with unspecified angina pectoris: Secondary | ICD-10-CM | POA: Diagnosis not present

## 2018-06-28 DIAGNOSIS — J449 Chronic obstructive pulmonary disease, unspecified: Secondary | ICD-10-CM | POA: Diagnosis not present

## 2018-06-28 DIAGNOSIS — I5030 Unspecified diastolic (congestive) heart failure: Secondary | ICD-10-CM | POA: Diagnosis not present

## 2018-06-28 DIAGNOSIS — I110 Hypertensive heart disease with heart failure: Secondary | ICD-10-CM | POA: Diagnosis not present

## 2018-06-28 LAB — CULTURE, BLOOD (ROUTINE X 2)
Culture: NO GROWTH
Culture: NO GROWTH
Special Requests: ADEQUATE

## 2018-07-02 ENCOUNTER — Telehealth: Payer: Self-pay | Admitting: Licensed Clinical Social Worker

## 2018-07-02 NOTE — Telephone Encounter (Signed)
Palliative Care SW spoke with patient and scheduled a home visit for 3/24, at 2:30pm.

## 2018-07-03 ENCOUNTER — Other Ambulatory Visit: Payer: Medicare Other | Admitting: Licensed Clinical Social Worker

## 2018-07-03 ENCOUNTER — Other Ambulatory Visit: Payer: Medicare Other | Admitting: *Deleted

## 2018-07-03 ENCOUNTER — Other Ambulatory Visit: Payer: Self-pay

## 2018-07-03 DIAGNOSIS — A419 Sepsis, unspecified organism: Secondary | ICD-10-CM | POA: Diagnosis not present

## 2018-07-03 DIAGNOSIS — Z515 Encounter for palliative care: Secondary | ICD-10-CM

## 2018-07-03 DIAGNOSIS — L97529 Non-pressure chronic ulcer of other part of left foot with unspecified severity: Secondary | ICD-10-CM | POA: Diagnosis not present

## 2018-07-03 NOTE — Progress Notes (Signed)
COMMUNITY PALLIATIVE CARE SW NOTE  PATIENT NAME: Brittany [name] DOB: May 02, 1927 MRN: [id number]  PRIMARY CARE PROVIDER: Mayra Neer, MD  RESPONSIBLE PARTY:  Acct ID - Guarantor Home Phone Work Phone Relationship Acct Type  [id number] Brittany [name], Brittany [name]* [phone]  Self P/F     [address], [place], [place] [address]     PLAN OF CARE and INTERVENTIONS:             1. GOALS OF CARE/ ADVANCE CARE PLANNING:  Patient's goal is to remain in her daughter's home and not be hospitalized.  Prior record indicates patient is a DNR. 2. SOCIAL/EMOTIONAL/SPIRITUAL ASSESSMENT/ INTERVENTIONS:  SW and Palliative Care RN, Brittany [name], met with patient and her daughter, Brittany [name], in daughter's home.  She was in a chair with her legs elevated.  Patient was alert and denied pain.  SW provided active listening and supportive counseling while Brittany [name] provided patient's extensive medical history.  Patient has a wound on her foot that is not healing and Brittany [name] is exploring her options.  Patient has several grandchildren and five great grandchildren.  Patient has family members that live in the area.  Not being able to visit with them due to the pandemic continues to be an adjustment. 3. PATIENT/CAREGIVER EDUCATION/ COPING:  Patient copes by expressing her feelings openly.  She depends on her daughter, Brittany [name], for her medical appointments. 4. PERSONAL EMERGENCY PLAN:  Patient will inform her daughter or caregiver of medical concerns.  EMS will be contacted as needed. 5. COMMUNITY RESOURCES COORDINATION/ HEALTH CARE NAVIGATION:  Patient has private caregivers Monday through Friday for four hours in the afternoons. 6. FINANCIAL/LEGAL CONCERNS/INTERVENTIONS:  None.     SOCIAL HX:  Social History   Tobacco Use  . Smoking status: Former Smoker    Packs/day: 0.50    Years: 40.00    Pack years: 20.00    Types: Cigarettes    Start date: 05/06/1949    Last attempt to quit: 03/06/1990    Years since  quitting: 28.3  . Smokeless tobacco: Never Used  Substance Use Topics  . Alcohol use: No    Alcohol/week: 0.0 standard drinks    Comment: Remote social EtOH    CODE STATUS:  DNR  ADVANCED DIRECTIVES: N MOST FORM COMPLETE:  N HOSPICE EDUCATION PROVIDED:  Patient was previously under Hospice care and decided to discharge because she "was not ready". PPS:  Patient reports her appetite is becoming normal again.  She uses a walker to ambulate. Duration of visit and documentation:  60 minutes.      Creola Corn Vandana Haman, LCSW

## 2018-07-04 ENCOUNTER — Telehealth: Payer: Self-pay | Admitting: Cardiology

## 2018-07-04 NOTE — Telephone Encounter (Signed)
msg to be forwarded to NP/CMA

## 2018-07-04 NOTE — Telephone Encounter (Signed)
Caregiver called on behalf of pt. Pt would like to know if the appt she was supposed to have on 04/03 with Pecolia Ades could be done virtually. The patient can not leave the house

## 2018-07-05 ENCOUNTER — Ambulatory Visit (INDEPENDENT_AMBULATORY_CARE_PROVIDER_SITE_OTHER): Payer: Medicare Other | Admitting: Plastic Surgery

## 2018-07-05 ENCOUNTER — Telehealth: Payer: Self-pay | Admitting: Pharmacist

## 2018-07-05 ENCOUNTER — Telehealth: Payer: Self-pay | Admitting: Cardiovascular Disease

## 2018-07-05 ENCOUNTER — Other Ambulatory Visit: Payer: Self-pay

## 2018-07-05 ENCOUNTER — Encounter: Payer: Self-pay | Admitting: Plastic Surgery

## 2018-07-05 DIAGNOSIS — S91309A Unspecified open wound, unspecified foot, initial encounter: Secondary | ICD-10-CM | POA: Insufficient documentation

## 2018-07-05 DIAGNOSIS — S91302A Unspecified open wound, left foot, initial encounter: Secondary | ICD-10-CM | POA: Diagnosis not present

## 2018-07-05 DIAGNOSIS — Z719 Counseling, unspecified: Secondary | ICD-10-CM

## 2018-07-05 MED ORDER — ZINC SULFATE 220 (50 ZN) MG PO CAPS: 220.0000 mg | ORAL_CAPSULE | Freq: Every day | ORAL | 0 refills | Status: DC

## 2018-07-05 NOTE — Telephone Encounter (Signed)
New Message:   Patient daughter calling stating that her mother needs a order to get home health coumadin check at home. Please call patient daughter. At [phone]

## 2018-07-05 NOTE — Progress Notes (Signed)
COMMUNITY PALLIATIVE CARE RN NOTE  PATIENT NAME: Brittany [name] DOB: February 23, 1928 MRN: [id number]  PRIMARY CARE PROVIDER: Mayra Neer, MD  RESPONSIBLE PARTY:  Acct ID - Guarantor Home Phone Work Phone Relationship Acct Type  [id number] WESTLYN, GLAZA* [phone]  Self P/F     [address], [place], [place] [address]    PLAN OF CARE and INTERVENTION:  1. ADVANCE CARE PLANNING/GOALS OF CARE: Goal is for patient to remain with daughter in her home. She wants to avoid hospitalizations. 2. PATIENT/CAREGIVER EDUCATION: Explained Palliative Care Services  3. DISEASE STATUS: Joint visit made with Palliative Care SW, Lynn Duffy. Met with patient and her daughter, [name]. She is sitting up in the living room with legs elevated and is awake and alert. Pleasant mood and engaging. She denies pain at time of visit. Patient spoke about recently revoking hospice services, as she was feeling better and wanted to live for her family. She has been having issues lately, daughter states, due to a growth being removed on her L foot that became infected. She just recently completed a course of IV antibiotics. Daughter is noticing that the place on her L foot is starting to become a pinkish color again, so she has an appointment with a new Wound Care clinic tomorrow and is to start on Keflex. Patient states that she is ambulatory, while using her walker and will walk around the home several times to increase endurance and improve overall conditioning. She also does squats and arm exercises to help. Patient states her breathing has improved greatly since she was diuresed in a recent hospital visit. No dyspnea noted throughout visit. Her appetite is starting to improve, but states about a week ago it was very poor. She also drinks Ensure for nutritional supplementation. She does have a home health aide that visits daily for about 4 hours in the evenings during the week. Daughter and patient are agreeable to future  Palliative Care visits. Will continue to monitor.  HISTORY OF PRESENT ILLNESS: This is a 85 yo female who resides with her daughter. She was admitted on 3/5 to the hospital for 7 days for a fever and weakness. Also had L foot cellulitis. Blood cultures positive for Group C strep and was treated with IV antibiotics. On 06/23/18 required another 3 day hospitalization for c/o increased shortness of breath and tachypnea. She requires bi-pap initially and required diuresing for volume overload. She was started on Demadex. Palliative Care Team asked to follow patient to help with symptom management needs. Will visit patient monthly and PRN.   CODE STATUS: DNR ADVANCED DIRECTIVES: N MOST FORM: no PPS: 50%   PHYSICAL EXAM:    LUNGS: clear to auscultation  CARDIAC: Cor RRR EXTREMITIES: No edema SKIN: L foot ulcer is pink in color; Exposed skin is dry and intact  NEURO: Alert and oriented x3, pleasant mood, ambulatory with walker   (Duration of visit and documentation 75 minutes)    Daryl Eastern, RN BSN

## 2018-07-05 NOTE — Telephone Encounter (Signed)
Request for [facility] to assist with Silver Alginate dressing change 1 time a week- per Dr. Marla Roe- faxed 07/05/18  Brittany [name]

## 2018-07-05 NOTE — Telephone Encounter (Signed)
Patients daughter asking about home health services to check mothers INR. She will be getting orders for wound care in the home. Once that is set up, I have advised the daughter to either give them our information and contact us or to give [place] the companys information so we can provide them with a verbal order to check INR.

## 2018-07-05 NOTE — Progress Notes (Signed)
Patient ID: Brittany [name], female    DOB: April 19, 1927, 85 y.o.   MRN: [id number]   Chief Complaint  Patient presents with  . Advice Only    for (L) foot wound    The patient is an 85 year old white female here with her daughter for evaluation of her left foot.  She has been suffering with a wound on the lateral aspect of her left foot for over a year.  She has been managed at the wound care center.  She is currently using silver alginate on the area with little improvement.  She had 2 episodes of hospital admission with concerns for cellulitis and sepsis.  She is doing better from that that was complicated by her lung disease.  She is at home with her daughter.  She gets some help from home health.  Today she has significant swelling of the left foot it is a little bit red in color but not cellulitic.  The leg is a little red but not overly swollen.  The wound is 1 x 2 cm in size has a little bit of discoloration and is very tender to touch.  The biggest concern is the pain.  The daughter expresses frustration with the length of time she has had the wound.   Review of Systems  Constitutional: Negative.   HENT: Negative.   Eyes: Negative.   Respiratory: Negative.   Cardiovascular: Positive for leg swelling.  Gastrointestinal: Negative.   Endocrine: Negative.   Genitourinary: Negative.   Musculoskeletal: Negative.   Skin: Positive for color change and wound.  Psychiatric/Behavioral: Negative.     Past Medical History:  Diagnosis Date  . Abnormal liver diagnostic imaging    suspected cirrhosis  . Arthritis   . Atrial fibrillation ([place])   . Chronic back pain   . Diverticulosis   . Dyslipidemia    takes Lipitor daily  . Dysrhythmia    Atrial Fibrillation  . Foot drop    SINCE 1987  . GERD (gastroesophageal reflux disease)   . H/O hiatal hernia   . Hearing impaired    Bilateral hearing aids  . History of bacterial endocarditis   . History of gastritis   . History of  stomach ulcers    many yrs ago  . History of TIA (transient ischemic attack)    2013-- RESIDUAL PERIPHERAL VISION RIGHT EYE--  RESOLVED  . Hypertension   . IC (interstitial cystitis)   . Macular degeneration   . Pulmonary hypertension ([place]) 10/2013   Based on TTE  . S/P aortic valve replacement    2005  . Urine incontinence     Past Surgical History:  Procedure Laterality Date  . ABDOMINAL HYSTERECTOMY  1967  . AORTIC VALVE REPLACEMENT  09-29-2003    [facility] pericardial tissue valve  . APPENDECTOMY  1933  . BIOPSY  02/01/2018   Procedure: BIOPSY;  Surgeon: Rush Landmark Telford Nab., MD;  Location: [facility];  Service: Gastroenterology;;  . CARDIAC CATHETERIZATION  07-15-2003 DR HELEN PRESTON   MODERATE TO MODERATELY SEVERE CALCIFIC AORTIC STENOSIS/  NORMAL CORONARY ARTERIES  . CARDIOVASCULAR STRESS TEST  01-30-2012  DR NASHER   NORMAL NUCLEAR STUDY/  EF 73%/  NORMAL LVF  . CARPAL TUNNEL RELEASE Bilateral   . CATARACT EXTRACTION W/ INTRAOCULAR LENS  IMPLANT, BILATERAL    . CHOLECYSTECTOMY  1993  . CYSTO WITH HYDRODISTENSION N/A 02/05/2013   Procedure: CYSTOSCOPY/HYDRODISTENSION;  Surgeon: Irine Seal, MD;  Location: [facility];  Service: Urology;  Laterality: N/A;  . CYSTO/ HYDRODISTENTION/ INSTILLATION CLORPACTIN  MULTIPLE  last one 2009  . DILATION AND CURETTAGE OF UTERUS    . ERCP N/A 08/10/2012   Procedure: ENDOSCOPIC RETROGRADE CHOLANGIOPANCREATOGRAPHY (ERCP);  Surgeon: Inda Castle, MD;  Location: [facility];  Service: Gastroenterology;  Laterality: N/A;  . ESOPHAGOGASTRODUODENOSCOPY (EGD) WITH PROPOFOL N/A 08/18/2014   Procedure: ESOPHAGOGASTRODUODENOSCOPY (EGD) WITH PROPOFOL;  Surgeon: Inda Castle, MD;  Location: [facility];  Service: Endoscopy;  Laterality: N/A;  . ESOPHAGOGASTRODUODENOSCOPY (EGD) WITH PROPOFOL N/A 02/01/2018   Procedure: ESOPHAGOGASTRODUODENOSCOPY (EGD) WITH PROPOFOL;  Surgeon: Rush Landmark Telford Nab., MD;  Location: [place];  Service: Gastroenterology;  Laterality: N/A;  . FOREIGN BODY REMOVAL  02/01/2018   Procedure: FOREIGN BODY REMOVAL;  Surgeon: Rush Landmark Telford Nab., MD;  Location: [place];  Service: Gastroenterology;;  . LEFT HEART CATHETERIZATION WITH CORONARY ANGIOGRAM N/A 10/18/2013   Procedure: LEFT HEART CATHETERIZATION WITH CORONARY ANGIOGRAM;  Surgeon: Jettie Booze, MD;  Location: [facility] CATH LAB;  Service: Cardiovascular;  Laterality: N/A;  . LUMBAR Nitro &  2007  . MACROPLASTIQUE URETHRAL IMPLANTATION  08-27-2009  . MINI-OPEN RIGHT ROTATOR CUFF REPAIR  07-12-2001  . RIGHT SHOULDER ARTHROSCOPY W/ DEBRIDEMENT ROTATOR CUFF AND LABRAL TEAR/ ACROMINOPLASTY/ DISTAL CLAVICLE EXCISION/ CA LIGAMENT RELEASE  10-08-1999  . TEE WITHOUT CARDIOVERSION N/A 06/20/2018   Procedure: TRANSESOPHAGEAL ECHOCARDIOGRAM (TEE);  Surgeon: Jerline Pain, MD;  Location: [facility];  Service: Cardiovascular;  Laterality: N/A;  . TONSILLECTOMY AND ADENOIDECTOMY    . TRANSTHORACIC ECHOCARDIOGRAM  08-10-2011   MILD LVH/  EF 55-60%/  NORMAL AVR TISSUE/ MILD MR/  MODERATE DILATED LA/  MILD DILATED RA  . TRANSVAGINAL TAPE PROCEDURE  07-30-2002  . VEIN LIGATION  1969   RIGHT LOWER LEG      Current Outpatient Medications:  .  acetaminophen (TYLENOL) 325 MG tablet, Take 650 mg by mouth daily as needed for mild pain., Disp: , Rfl:  .  atorvastatin (LIPITOR) 20 MG tablet, Take 20 mg by mouth daily., Disp: , Rfl:  .  busPIRone (BUSPAR) 15 MG tablet, Take 1 tablet (15 mg total) by mouth 2 (two) times daily., Disp: 180 tablet, Rfl: 2 .  cefTRIAXone (ROCEPHIN) IVPB, Inject 2 g into the vein daily. Indication:  Group G streptococcal bacteremia Last Day of Therapy:  06/29/18 Labs - Once weekly:  CBC/D and BMP, Labs - Every other week:  ESR and CRP, Disp: 9 Units, Rfl: 0 .  cycloSPORINE (RESTASIS) 0.05 % ophthalmic emulsion, Place 1 drop into both eyes 2 (two) times daily., Disp: , Rfl:  .  diazepam (VALIUM) 2  MG tablet, Take 1 tablet (2 mg total) by mouth at bedtime as needed for anxiety., Disp: 10 tablet, Rfl: 0 .  diltiazem (CARDIZEM) 120 MG tablet, Take 120 mg by mouth 2 (two) times daily., Disp: , Rfl:  .  HYDROcodone-acetaminophen (NORCO/VICODIN) 5-325 MG tablet, Take 1 tablet by mouth every 6 (six) hours as needed for severe pain., Disp: 14 tablet, Rfl: 0 .  morphine 20 MG/5ML solution, Take 0.6 mLs (2.4 mg total) by mouth every 4 (four) hours as needed (dyspnea)., Disp: 20 mL, Rfl: 0 .  Multiple Vitamins-Minerals (ICAPS AREDS 2) CAPS, Take 1 capsule by mouth 2 (two) times daily., Disp: , Rfl:  .  nitroGLYCERIN (NITROSTAT) 0.4 MG SL tablet, Place 1 tablet (0.4 mg total) under the tongue every 5 (five) minutes as needed for chest pain., Disp: 30 tablet, Rfl: 0 .  ondansetron (ZOFRAN) 8 MG tablet, Take 1 tablet (8 mg total) by mouth 2 (two) times daily. (Patient taking differently: Take 8 mg by mouth every 8 (eight) hours as needed for nausea or vomiting. ), Disp: 90 tablet, Rfl: 1 .  oxybutynin (DITROPAN) 5 MG tablet, Take 5 mg by mouth daily., Disp: , Rfl:  .  pantoprazole (PROTONIX) 40 MG tablet, Take 40 mg by mouth 2 (two) times daily., Disp: , Rfl:  .  Polyvinyl Alcohol-Povidone (REFRESH OP), Place 1 drop into both eyes 2 (two) times daily., Disp: , Rfl:  .  torsemide (DEMADEX) 20 MG tablet, Take 1 tablet (20 mg total) by mouth daily., Disp: 30 tablet, Rfl: 0 .  warfarin (COUMADIN) 5 MG tablet, Take as directed by [facility] (Patient taking differently: Take 2.5-5 mg by mouth daily at 6 PM. Take '5mg'$  by mouth daily at 6pm. Take 2.'5mg'$  by mouth on T/Th.), Disp: 30 tablet, Rfl: 2   Objective:   Vitals:   07/05/18 0959  BP: (!) 124/58  Pulse: 60  Temp: 98 F (36.7 C)  SpO2: 95%    Physical Exam Vitals signs and nursing note reviewed.  Constitutional:      Appearance: Normal appearance.  HENT:     Head: Normocephalic and atraumatic.  Cardiovascular:     Rate and Rhythm: Normal rate.      Pulses: Normal pulses.  Pulmonary:     Effort: Pulmonary effort is normal.  Neurological:     Mental Status: She is alert and oriented to person, place, and time.  Psychiatric:        Mood and Affect: Mood normal.        Behavior: Behavior normal.     Assessment & Plan:  Open wound of left foot, initial encounter Recommend debridement of left foot wound with Acell placement. Started silver sock therapy. Add multivitamin, Vit C and Zinc daily. Keep leg elevated.  [place], DO

## 2018-07-05 NOTE — Telephone Encounter (Signed)
Spoke with pt's daughter, pt is scheduled to have a e-visit with Pecolia Ades, NP on 07/13/2018 @ 11:30 AM

## 2018-07-07 DIAGNOSIS — N183 Chronic kidney disease, stage 3 (moderate): Secondary | ICD-10-CM | POA: Diagnosis not present

## 2018-07-07 DIAGNOSIS — Z87891 Personal history of nicotine dependence: Secondary | ICD-10-CM | POA: Diagnosis not present

## 2018-07-07 DIAGNOSIS — L97521 Non-pressure chronic ulcer of other part of left foot limited to breakdown of skin: Secondary | ICD-10-CM | POA: Diagnosis not present

## 2018-07-07 DIAGNOSIS — L03116 Cellulitis of left lower limb: Secondary | ICD-10-CM | POA: Diagnosis not present

## 2018-07-07 DIAGNOSIS — M21372 Foot drop, left foot: Secondary | ICD-10-CM | POA: Diagnosis not present

## 2018-07-07 DIAGNOSIS — I25119 Atherosclerotic heart disease of native coronary artery with unspecified angina pectoris: Secondary | ICD-10-CM | POA: Diagnosis not present

## 2018-07-07 DIAGNOSIS — I69318 Other symptoms and signs involving cognitive functions following cerebral infarction: Secondary | ICD-10-CM | POA: Diagnosis not present

## 2018-07-07 DIAGNOSIS — Z7901 Long term (current) use of anticoagulants: Secondary | ICD-10-CM | POA: Diagnosis not present

## 2018-07-07 DIAGNOSIS — I272 Pulmonary hypertension, unspecified: Secondary | ICD-10-CM | POA: Diagnosis not present

## 2018-07-07 DIAGNOSIS — I5032 Chronic diastolic (congestive) heart failure: Secondary | ICD-10-CM | POA: Diagnosis not present

## 2018-07-07 DIAGNOSIS — J449 Chronic obstructive pulmonary disease, unspecified: Secondary | ICD-10-CM | POA: Diagnosis not present

## 2018-07-07 DIAGNOSIS — Z953 Presence of xenogenic heart valve: Secondary | ICD-10-CM | POA: Diagnosis not present

## 2018-07-07 DIAGNOSIS — F0150 Vascular dementia without behavioral disturbance: Secondary | ICD-10-CM | POA: Diagnosis not present

## 2018-07-07 DIAGNOSIS — I482 Chronic atrial fibrillation, unspecified: Secondary | ICD-10-CM | POA: Diagnosis not present

## 2018-07-07 DIAGNOSIS — I130 Hypertensive heart and chronic kidney disease with heart failure and stage 1 through stage 4 chronic kidney disease, or unspecified chronic kidney disease: Secondary | ICD-10-CM | POA: Diagnosis not present

## 2018-07-07 DIAGNOSIS — J9621 Acute and chronic respiratory failure with hypoxia: Secondary | ICD-10-CM | POA: Diagnosis not present

## 2018-07-07 DIAGNOSIS — Z5181 Encounter for therapeutic drug level monitoring: Secondary | ICD-10-CM | POA: Diagnosis not present

## 2018-07-10 DIAGNOSIS — L03116 Cellulitis of left lower limb: Secondary | ICD-10-CM | POA: Diagnosis not present

## 2018-07-10 DIAGNOSIS — L97521 Non-pressure chronic ulcer of other part of left foot limited to breakdown of skin: Secondary | ICD-10-CM | POA: Diagnosis not present

## 2018-07-10 DIAGNOSIS — I25119 Atherosclerotic heart disease of native coronary artery with unspecified angina pectoris: Secondary | ICD-10-CM | POA: Diagnosis not present

## 2018-07-10 DIAGNOSIS — F0150 Vascular dementia without behavioral disturbance: Secondary | ICD-10-CM | POA: Diagnosis not present

## 2018-07-10 DIAGNOSIS — I69318 Other symptoms and signs involving cognitive functions following cerebral infarction: Secondary | ICD-10-CM | POA: Diagnosis not present

## 2018-07-10 DIAGNOSIS — I482 Chronic atrial fibrillation, unspecified: Secondary | ICD-10-CM | POA: Diagnosis not present

## 2018-07-10 LAB — POCT INR: INR: 2.8 (ref 2.0–3.0)

## 2018-07-11 ENCOUNTER — Ambulatory Visit (INDEPENDENT_AMBULATORY_CARE_PROVIDER_SITE_OTHER): Payer: Medicare Other | Admitting: Cardiovascular Disease

## 2018-07-11 DIAGNOSIS — I482 Chronic atrial fibrillation, unspecified: Secondary | ICD-10-CM

## 2018-07-11 DIAGNOSIS — Z5181 Encounter for therapeutic drug level monitoring: Secondary | ICD-10-CM

## 2018-07-12 ENCOUNTER — Encounter: Payer: Self-pay | Admitting: Cardiology

## 2018-07-12 ENCOUNTER — Telehealth: Payer: Self-pay | Admitting: Cardiology

## 2018-07-12 NOTE — Progress Notes (Signed)
Virtual Visit via Video Note    Evaluation Performed:  Follow-up visit  This visit type was conducted due to national recommendations for restrictions regarding the COVID-19 Pandemic (e.g. social distancing).  This format is felt to be most appropriate for this patient at this time.  All issues noted in this document were discussed and addressed.  No physical exam was performed (except for noted visual exam findings with Video Visits).  Please refer to the patient's chart (MyChart message for video visits and phone note for telephone visits) for the patient's consent to telehealth for [facility].  Date:  07/13/2018   ID:  Brittany [name], DOB 1927-08-31, MRN [id number]  Patient Location:  Home  Provider location:   Home  PCP:  Mayra Neer, MD  Cardiologist:  Mertie Moores, MD  Electrophysiologist:  None   Chief Complaint:  Hospital follow up [facility]  History of Present Illness:    Brittany [name] is a 85 y.o. female who presents via video conferencing for a telehealth visit today.    The patient does not have symptoms concerning for COVID-19 infection (fever, chills, cough, or new shortness of breath).   Brittany [name] has a past medical history significant for AVR (Edwards tissue valve 2005), hypertension, dyslipidemia, cirrhosis, prior stroke with right visual field defect and chronic atrial fibrillation on warfarin.  She does have a history of coronary disease with cath in 2015 that showed moderate disease in the mid LAD, mild to moderate disease of left circumflex and severe fibrotic disease of the ostial right coronary.  This was successfully stented with a 2.5 x 14 mm resolute drug-eluting stent postdilated to 3.3 mm.   Pt had been admitted to the hospital on 06/14/18 for 7 days for weakness and fever, treated for left foot cellulitis and strep G bacteremia. She was again hospitalized for 3 days with c/o increase shortness of breath and tachypnea. She initially required  BiPap and diuresis for volume overload. She was started on demadex. Pt unable to leave her home. Pt is being followed by palliative care with the goal of having pt remain at home with her daughter and avoid hospitalizations. She is now having INR checked through home health.   TEE on 06/20/2018 while hospitalized with bacteremia showed EF 55%, Edwards bioprosthetic aortic valve well seated without vegetation. There was severe TR with redundant eustachian valve. There was no thrombus.   Labs on 06/26/2018 showed normal renal function with SCr 0.76. Potassium normal at 4.0. Sodium low but slightly improved since hospital, 130 (from as low as 124). WBC came down to 10.6 from 19.7. Hgb 8.8. INR done on 07/10/18 was 2.8, therapeutic.   This is a hospital follow up appt. Ms. Shuffield daughter [name] is present with the patient. She is very informative and involved in the patient's care.   Ms. Benda says she has been doing great until today she feels wobbly and has low BP 84/58. 99/64 a few days ago per home health nurse. She has been feeling stronger than when she was in the hospital.   Her breathing was worse a few days ago but not so much now. Now she is only mildly short of breath with walking, but she recovers quickly. She has no swelling at all in the feet/ankles. She is wearing compression stockings.  No orthopnea or PND. No chest discomfort.   Foot wound is improving. She has seen a Psychiatric nurse with plans for surgery which has been schedule for 5/6,  due to COVID restrictions this is the earliest they could get.    Prior CV studies:   The following studies were reviewed today:  TEE 06/20/2018  IMPRESSIONS  1. The left ventricle has normal systolic function, with an ejection fraction of 60-65%. The cavity size was normal.  2. The right ventricle has normal systolc function. The cavity was normal. There is no increase in right ventricular wall thickness.  3. Left atrial size was severely  dilated.  4. Right atrial size was severely dilated.  5. Moderate mitral regurgitation, PISA 0.7, ERO 0.15, no PV flow reversal.  6. Tricuspid valve regurgitation is moderate-severe.  7. A an Edwards bioprosthesis valve is present in the aortic position. Procedure Date: 2005 Echo findings are consistent with thickening of the aortic prosthesis.  8. Pulmonic valve regurgitation was not assessed by color flow Doppler.  9. There is evidence of moderate plaque in the descending aorta.  SUMMARY No vegetations, no endocarditis.  FINDINGS  Left Ventricle: The left ventricle has normal systolic function, with an ejection fraction of 60-65%. The cavity size was normal. There is no increase in left ventricular wall thickness. Right Ventricle: The right ventricle has normal systolic function. The cavity was normal. There is no increase in right ventricular wall thickness. Left Atrium: Left atrial size was severely dilated. Right Atrium: Right atrial size was severely dilated. Right atrial pressure is estimated at 10 mmHg. Prominent Eustachian valve. Interatrial Septum: No atrial level shunt detected by color flow Doppler. Pericardium: There is no evidence of pericardial effusion. Mitral Valve: Mitral valve regurgitation was not assessed by color flow Doppler. Moderate mitral regurgitation, PISA 0.7, ERO 0.15, no PV flow reversal. Tricuspid Valve: Tricuspid valve regurgitation is moderate-severe by color flow Doppler. Aortic Valve: Aortic valve regurgitation was not visualized by color flow Doppler. A Edwards bioprosthetic aortic valve valve is present in the aortic position. Procedure Date: 2005 Echo findings are consistent with thickening of the aortic prosthesis. Pulmonic Valve: Pulmonic valve regurgitation was not assessed by color flow Doppler. Aorta: There is evidence of moderate plaque in the descending aorta. Venous: The inferior vena cava is normal in size with greater than 50% respiratory  variability.     Past Medical History:  Diagnosis Date  . Abnormal liver diagnostic imaging    suspected cirrhosis  . Arthritis   . Atrial fibrillation ([place])   . Chronic back pain   . Diverticulosis   . Dyslipidemia    takes Lipitor daily  . Dysrhythmia    Atrial Fibrillation  . Foot drop    SINCE 1987  . GERD (gastroesophageal reflux disease)   . H/O hiatal hernia   . Hearing impaired    Bilateral hearing aids  . History of bacterial endocarditis   . History of gastritis   . History of stomach ulcers    many yrs ago  . History of TIA (transient ischemic attack)    2013-- RESIDUAL PERIPHERAL VISION RIGHT EYE--  RESOLVED  . Hypertension   . IC (interstitial cystitis)   . Macular degeneration   . Pulmonary hypertension ([place]) 10/2013   Based on TTE  . S/P aortic valve replacement    2005  . Urine incontinence    Past Surgical History:  Procedure Laterality Date  . ABDOMINAL HYSTERECTOMY  1967  . AORTIC VALVE REPLACEMENT  09-29-2003    [facility] pericardial tissue valve  . APPENDECTOMY  1933  . BIOPSY  02/01/2018   Procedure: BIOPSY;  Surgeon: Irving Copas.,  MD;  Location: [place];  Service: Gastroenterology;;  . CARDIAC CATHETERIZATION  07-15-2003 DR HELEN PRESTON   MODERATE TO MODERATELY SEVERE CALCIFIC AORTIC STENOSIS/  NORMAL CORONARY ARTERIES  . CARDIOVASCULAR STRESS TEST  01-30-2012  DR NASHER   NORMAL NUCLEAR STUDY/  EF 73%/  NORMAL LVF  . CARPAL TUNNEL RELEASE Bilateral   . CATARACT EXTRACTION W/ INTRAOCULAR LENS  IMPLANT, BILATERAL    . CHOLECYSTECTOMY  1993  . CYSTO WITH HYDRODISTENSION N/A 02/05/2013   Procedure: CYSTOSCOPY/HYDRODISTENSION;  Surgeon: Irine Seal, MD;  Location[facility];  Service: Urology;  Laterality: N/A;  . CYSTO/ HYDRODISTENTION/ INSTILLATION CLORPACTIN  MULTIPLE  last one 2009  . DILATION AND CURETTAGE OF UTERUS    . ERCP N/A 08/10/2012   Procedure: ENDOSCOPIC RETROGRADE  CHOLANGIOPANCREATOGRAPHY (ERCP);  Surgeon: Inda Castle, MD;  Location: [place];  Service: Gastroenterology;  Laterality: N/A;  . ESOPHAGOGASTRODUODENOSCOPY (EGD) WITH PROPOFOL N/A 08/18/2014   Procedure: ESOPHAGOGASTRODUODENOSCOPY (EGD) WITH PROPOFOL;  Surgeon: Inda Castle, MD;  Location: [facility];  Service: Endoscopy;  Laterality: N/A;  . ESOPHAGOGASTRODUODENOSCOPY (EGD) WITH PROPOFOL N/A 02/01/2018   Procedure: ESOPHAGOGASTRODUODENOSCOPY (EGD) WITH PROPOFOL;  Surgeon: Rush Landmark Telford Nab., MD;  Location: [place];  Service: Gastroenterology;  Laterality: N/A;  . FOREIGN BODY REMOVAL  02/01/2018   Procedure: FOREIGN BODY REMOVAL;  Surgeon: Rush Landmark Telford Nab., MD;  Location: [place];  Service: Gastroenterology;;  . LEFT HEART CATHETERIZATION WITH CORONARY ANGIOGRAM N/A 10/18/2013   Procedure: LEFT HEART CATHETERIZATION WITH CORONARY ANGIOGRAM;  Surgeon: Jettie Booze, MD;  Location: [facility];  Service: Cardiovascular;  Laterality: N/A;  . LUMBAR Tina &  2007  . MACROPLASTIQUE URETHRAL IMPLANTATION  08-27-2009  . MINI-OPEN RIGHT ROTATOR CUFF REPAIR  07-12-2001  . RIGHT SHOULDER ARTHROSCOPY W/ DEBRIDEMENT ROTATOR CUFF AND LABRAL TEAR/ ACROMINOPLASTY/ DISTAL CLAVICLE EXCISION/ CA LIGAMENT RELEASE  10-08-1999  . TEE WITHOUT CARDIOVERSION N/A 06/20/2018   Procedure: TRANSESOPHAGEAL ECHOCARDIOGRAM (TEE);  Surgeon: Jerline Pain, MD;  Location: [facility] ENDOSCOPY;  Service: Cardiovascular;  Laterality: N/A;  . TONSILLECTOMY AND ADENOIDECTOMY    . TRANSTHORACIC ECHOCARDIOGRAM  08-10-2011   MILD LVH/  EF 55-60%/  NORMAL AVR TISSUE/ MILD MR/  MODERATE DILATED LA/  MILD DILATED RA  . TRANSVAGINAL TAPE PROCEDURE  07-30-2002  . VEIN LIGATION  1969   RIGHT LOWER LEG     Current Meds  Medication Sig  . acetaminophen (TYLENOL) 325 MG tablet Take 650 mg by mouth daily as needed for mild pain.  Marland Kitchen atorvastatin (LIPITOR) 20 MG tablet Take 20 mg by mouth daily.  .  busPIRone (BUSPAR) 15 MG tablet Take 1 tablet (15 mg total) by mouth 2 (two) times daily.  . cycloSPORINE (RESTASIS) 0.05 % ophthalmic emulsion Place 1 drop into both eyes 2 (two) times daily.  . diazepam (VALIUM) 2 MG tablet Take 1 tablet (2 mg total) by mouth at bedtime as needed for anxiety.  Marland Kitchen diltiazem (CARDIZEM) 120 MG tablet Take 120 mg by mouth 2 (two) times daily.  Marland Kitchen HYDROcodone-acetaminophen (NORCO/VICODIN) 5-325 MG tablet Take 1 tablet by mouth every 6 (six) hours as needed for severe pain.  Marland Kitchen lisinopril (PRINIVIL,ZESTRIL) 40 MG tablet Take 20 mg by mouth 2 (two) times daily.  Marland Kitchen morphine 20 MG/5ML solution Take 0.6 mLs (2.4 mg total) by mouth every 4 (four) hours as needed (dyspnea).  . Multiple Vitamins-Minerals (ICAPS AREDS 2) CAPS Take 1 capsule by mouth 2 (two) times daily.  . nitroGLYCERIN (NITROSTAT) 0.4  MG SL tablet Place 1 tablet (0.4 mg total) under the tongue every 5 (five) minutes as needed for chest pain.  Marland Kitchen ondansetron (ZOFRAN) 8 MG tablet Take 1 tablet (8 mg total) by mouth 2 (two) times daily. (Patient taking differently: Take 8 mg by mouth every 8 (eight) hours as needed for nausea or vomiting. )  . oxybutynin (DITROPAN) 5 MG tablet Take 5 mg by mouth 3 (three) times daily.   . pantoprazole (PROTONIX) 40 MG tablet Take 40 mg by mouth 2 (two) times daily.  . Polyvinyl Alcohol-Povidone (REFRESH OP) Place 1 drop into both eyes 2 (two) times daily.  Marland Kitchen torsemide (DEMADEX) 20 MG tablet Take 20 mg by mouth 2 (two) times daily.  Marland Kitchen warfarin (COUMADIN) 5 MG tablet Take as directed by [facility] (Patient taking differently: Take 2.5-5 mg by mouth daily at 6 PM. Take 5mg  by mouth daily at 6pm. Take 2.5mg  by mouth on T/Th.)  . zinc sulfate 220 (50 Zn) MG capsule Take 1 capsule (220 mg total) by mouth daily.  . [DISCONTINUED] torsemide (DEMADEX) 20 MG tablet Take 1 tablet (20 mg total) by mouth daily.     Allergies:   Amlodipine; Amoxicillin; Carafate [sucralfate]; Cephalexin;  Codeine; Irbesartan; Morphine and related; Neurontin [gabapentin]; Nitrofurantoin monohyd macro; Prednisone; and Sulfa antibiotics   Social History   Tobacco Use  . Smoking status: Former Smoker    Packs/day: 0.50    Years: 40.00    Pack years: 20.00    Types: Cigarettes    Start date: 05/06/1949    Last attempt to quit: 03/06/1990    Years since quitting: 28.3  . Smokeless tobacco: Never Used  Substance Use Topics  . Alcohol use: No    Alcohol/week: 0.0 standard drinks    Comment: Remote social EtOH  . Drug use: No     Family Hx: The patient's family history includes Alcohol abuse in her sister and sister; Bipolar disorder in her brother and son; Colon cancer in her maternal aunt; Coronary artery disease in her brother; Esophageal cancer in her maternal grandfather; Heart disease in her father; Stomach cancer in her maternal grandmother. There is no history of Lung disease.  ROS:   Please see the history of present illness.      Labs/Other Tests and Data Reviewed:    Recent Labs: 04/19/2018: TSH 1.33 05/08/2018: Magnesium 2.0 06/23/2018: ALT 26; B Natriuretic Peptide 274.0 06/26/2018: BUN 30; Creatinine, Ser 0.76; Hemoglobin 8.8; Platelets 416; Potassium 4.0; Sodium 130   Recent Lipid Panel Lab Results  Component Value Date/Time   CHOL 159 10/12/2013 05:25 AM   TRIG 53 10/12/2013 05:25 AM   HDL 62 10/12/2013 05:25 AM   CHOLHDL 2.6 10/12/2013 05:25 AM   LDLCALC 86 10/12/2013 05:25 AM    Wt Readings from Last 3 Encounters:  07/05/18 135 lb (61.2 kg)  06/26/18 126 lb (57.2 kg)  06/21/18 135 lb 2.3 oz (61.3 kg)     Objective:    Vital Signs:  BP 94/60   Pulse 60   Ht 5\' 1"  (1.549 m)   BMI 25.51 kg/m    Well nourished, well developed elderly female in no acute distress. Breathing normally, able to complete full sentences. No wheezing or coughing.  Alert and oriented, pleasant and conversant.  No pedal edema, wearing compression stockings.   ASSESSMENT & PLAN:     1.  CAD -No anginal symptoms.  -On statin, CCB. No aspirin in setting of need for A/C.   2.  Chronic atrial fibrillation -Rate controlled on diltiazem -On warfarin for stroke risk reduction in setting of AVR. No unusual bleeding. Now having INR checked via home health.   3. Chronic diastolic CHF -Hospitalized 3/14-3/17 for shortness of breath, CAP and volume overload. Diuresed. Renal function and K+ good at discharge. EF >65% on TTE and 60-65% on TEE.  -Demadex daily dosing plus PRN use started at hosp. BP is low today and pt feels wobbly. She is likely dry. I discussed with her daughter who is present with the patient. She also feels that pt is dry. She sees no signs of extra fluid on pt.  -Will have pt drink some extra water today. Hold demadex and lisinopril today and will revaluate. Will likely use demadex as needed. Discussed what to watch for and when to take doses. Daughter seems to very much understand and will watch pt S/S. I will call pt tomorrow to check on BP and how she feels. I think that she will need her lisinopril resumed at same dose as she has had hard to control BP in the past.   4. Hx of bioprosthetic aortic valve -TEE on 06/20/2018 showed well seated tissue AVR with no vegetations. Normal EF.   5. Recent sepsis due to left foot cellulitis and ulcer -Group C streptococcus bacteremia in 06/2018. Managed by IM and ID during hospitalization.  -Pt was treated with PICC & IV antibiotics which are complete. She is now on Keflex. She has plans for surgery on may.  -TEE showed no vegetations.   6. Hyperlipidemia -On atorvastatin. -Labs per [facility], 08/23/2017: TC 143, Trig 61, HDL 59, LDL 72. Lipids well controlled. Continue current therapy.    COVID-19 Education: The signs and symptoms of COVID-19 were discussed with the patient and how to seek care for testing (follow up with PCP or arrange E-visit).  The importance of social distancing was discussed today.  Patient Risk:    After full review of this patient's clinical status, I feel that they are at least moderate risk at this time.  Time:   Today, I have spent 23 minutes with the patient with telehealth technology discussing medications, symptoms, plan.     Medication Adjustments/Labs and Tests Ordered: Current medicines are reviewed at length with the patient today.  Concerns regarding medicines are outlined above.  Tests Ordered: No orders of the defined types were placed in this encounter.  Medication Changes: No orders of the defined types were placed in this encounter.   Disposition:  Follow up in 2 month(s)  Signed, Daune Perch, NP  07/13/2018 12:14 PM    [facility]

## 2018-07-12 NOTE — Telephone Encounter (Signed)
Hi [name] and [name],    I called the number that we have on file for Mrs. Brittany [name] and it was her caregivers number. She said that she can no longer go around [name] at this time so her daughter is handling her business. I called the number I have for her daughter and left a message for her to call me back. If I dont get in touch with her today, I will call her back in the morning to make sure that she is good to go. She is set up on mychart I just need to make sure she can get in and make sure we can get her set up on webex.

## 2018-07-13 ENCOUNTER — Ambulatory Visit: Payer: Medicare Other | Admitting: Cardiology

## 2018-07-13 ENCOUNTER — Telehealth: Payer: Self-pay | Admitting: Plastic Surgery

## 2018-07-13 ENCOUNTER — Telehealth (INDEPENDENT_AMBULATORY_CARE_PROVIDER_SITE_OTHER): Payer: Medicare Other | Admitting: Cardiology

## 2018-07-13 ENCOUNTER — Other Ambulatory Visit: Payer: Self-pay

## 2018-07-13 VITALS — BP 94/60 | HR 60 | Ht 61.0 in

## 2018-07-13 DIAGNOSIS — E785 Hyperlipidemia, unspecified: Secondary | ICD-10-CM | POA: Diagnosis not present

## 2018-07-13 DIAGNOSIS — I2510 Atherosclerotic heart disease of native coronary artery without angina pectoris: Secondary | ICD-10-CM | POA: Diagnosis not present

## 2018-07-13 DIAGNOSIS — Z952 Presence of prosthetic heart valve: Secondary | ICD-10-CM | POA: Diagnosis not present

## 2018-07-13 DIAGNOSIS — I5032 Chronic diastolic (congestive) heart failure: Secondary | ICD-10-CM | POA: Diagnosis not present

## 2018-07-13 DIAGNOSIS — I482 Chronic atrial fibrillation, unspecified: Secondary | ICD-10-CM

## 2018-07-13 NOTE — Telephone Encounter (Signed)
Called and spoke with the patient's daughter regarding the message below.  She stated that they have already received a call back.  They just wanted to know when the patient was scheduled for surgery.  I asked if they had everything they needed and she stated yes.//AB/CMA

## 2018-07-13 NOTE — Patient Instructions (Addendum)
Medication Instructions:  Change torsemide to 20 mg daily as needed for increased shortness of breath, swelling, weight, difficulty breathing when laying down. Call our office if you have symptoms that are not helped with taking torsemide. Hold lisinopril for this afternoon due to low blood pressure.  Drinks some extra fluids today. I will call you tomorrow to check on blood pressure and how you are doing. We will resume lisinopril when blood pressure is over 120.   If you need a refill on your cardiac medications before your next appointment, please call your pharmacy.   Lab work: None  If you have labs (blood work) drawn today and your tests are completely normal, you will receive your results only by: Marland Kitchen MyChart Message (if you have MyChart) OR . A paper copy in the mail If you have any lab test that is abnormal or we need to change your treatment, we will call you to review the results.  Testing/Procedures: None  Follow-Up: At [facility], you and your health needs are our priority.  As part of our continuing mission to provide you with exceptional heart care, we have created designated Provider Care Teams.  These Care Teams include your primary Cardiologist (physician) and Advanced Practice Providers (APPs -  Physician Assistants and Nurse Practitioners) who all work together to provide you with the care you need, when you need it. You will need a follow up appointment in:  2 months.  Please call our office 2 months in advance to schedule this appointment.  You may see Mertie Moores, MD or one of the following Advanced Practice Providers on your designated Care Team: Richardson Dopp, PA-C [place], [place] . Daune Perch, NP  Any Other Special Instructions Will Be Listed Below (If Applicable)   Do the following things EVERY DAY:   1. Weigh yourself EVERY morning after you go to the bathroom but before you eat or drink anything. Write this number down in a weight log/diary. If you  gain 3 pounds overnight or 5 pounds in a week, call the office.   2. Take your medicines as prescribed. If you have concerns about your medications, please call us before you stop taking them.    3. Eat low salt foods-Limit salt (sodium) to 2000 mg per day. This will help prevent your body from holding onto fluid. Read food labels as many processed foods have a lot of sodium, especially canned goods and prepackaged meats. If you would like some assistance choosing low sodium foods, we would be happy to set you up with a nutritionist.   4. Stay as active as you can everyday. Staying active will give you more energy and make your muscles stronger. Break up your activities--do some in the morning and some in the afternoon.  If you have chest pain, feel short of breath, dizzy, or lightheaded, STOP. If you don't feel better after a short rest, call 911. If you do feel better, call the office to let us know you have symptoms with exercise.   5. Limit all fluids for the day to less than 2 liters. Fluid includes all drinks, coffee, juice, ice chips, soup, jello, and all other liquids.

## 2018-07-13 NOTE — Telephone Encounter (Signed)
Patient's daughter called to inform us that the patient continues to have persistent foot pain that keeps her awake at night. She would like a call back to discuss possible options.

## 2018-07-14 ENCOUNTER — Telehealth: Payer: Self-pay | Admitting: Cardiology

## 2018-07-14 NOTE — Telephone Encounter (Signed)
I called and spoke to [name] daughter, [name] at [phone], regarding the plan that we made at yesterday's appt. The patient had low BP yesterday and appeared dry per dau. Her demadex was held and pt took in more water. Her BP came up to 100/58 last night and 126/83 this am. She got a scale and wt is 125, down from her normal wt of 135.  I had a long discussion about pt's fluid status and wanting to avoid future problems and hospitalization. Her daughter seems to have a good understanding of [name] care. I discussed possibly adding back demadex at 20 mg every other day (was 20 mg BID). [name] prefers to only use demadex as needed. We discussed early signs of volume overload and she feels confident that she can watch for these and manage her mom's volume.  Also OK to now resume lisinopril as prior. We discussed that if her BP is running low she can reduce lisinopril to once daily instead of twice.  [name] expressed appreciation for the follow up. I encouraged her that she can call anytime if she has questions or needs guidance.   Daune Perch, AGNP-C [facility] 07/14/2018  11:46 AM

## 2018-07-24 ENCOUNTER — Ambulatory Visit: Payer: Medicare Other | Admitting: Emergency Medicine

## 2018-07-26 ENCOUNTER — Ambulatory Visit (INDEPENDENT_AMBULATORY_CARE_PROVIDER_SITE_OTHER): Payer: Medicare Other | Admitting: Pharmacist

## 2018-07-26 ENCOUNTER — Telehealth: Payer: Self-pay | Admitting: Pharmacist

## 2018-07-26 DIAGNOSIS — I25119 Atherosclerotic heart disease of native coronary artery with unspecified angina pectoris: Secondary | ICD-10-CM | POA: Diagnosis not present

## 2018-07-26 DIAGNOSIS — I482 Chronic atrial fibrillation, unspecified: Secondary | ICD-10-CM | POA: Diagnosis not present

## 2018-07-26 DIAGNOSIS — F0150 Vascular dementia without behavioral disturbance: Secondary | ICD-10-CM | POA: Diagnosis not present

## 2018-07-26 DIAGNOSIS — L03116 Cellulitis of left lower limb: Secondary | ICD-10-CM | POA: Diagnosis not present

## 2018-07-26 DIAGNOSIS — Z5181 Encounter for therapeutic drug level monitoring: Secondary | ICD-10-CM | POA: Diagnosis not present

## 2018-07-26 DIAGNOSIS — I69318 Other symptoms and signs involving cognitive functions following cerebral infarction: Secondary | ICD-10-CM | POA: Diagnosis not present

## 2018-07-26 DIAGNOSIS — L97521 Non-pressure chronic ulcer of other part of left foot limited to breakdown of skin: Secondary | ICD-10-CM | POA: Diagnosis not present

## 2018-07-26 LAB — POCT INR: INR: 1.9 — AB (ref 2.0–3.0)

## 2018-07-26 NOTE — Telephone Encounter (Signed)
Spoke with Roselyn Reef from [facility] about INR not being drawn on Tuesday as ordered. The RN is going to home today and will draw INR and call to clinic.

## 2018-08-02 ENCOUNTER — Encounter (HOSPITAL_BASED_OUTPATIENT_CLINIC_OR_DEPARTMENT_OTHER)
Admission: RE | Admit: 2018-08-02 | Discharge: 2018-08-02 | Disposition: A | Discharge status: Home / Self Care | Payer: Medicare Other | Source: Ambulatory Visit | Attending: Plastic Surgery | Admitting: Plastic Surgery

## 2018-08-02 ENCOUNTER — Other Ambulatory Visit: Payer: Self-pay

## 2018-08-02 DIAGNOSIS — Z01812 Encounter for preprocedural laboratory examination: Secondary | ICD-10-CM | POA: Insufficient documentation

## 2018-08-02 LAB — BASIC METABOLIC PANEL
Anion gap: 7 (ref 5–15)
BUN: 16 mg/dL (ref 8–23)
CO2: 27 mmol/L (ref 22–32)
Calcium: 9.1 mg/dL (ref 8.9–10.3)
Chloride: 101 mmol/L (ref 98–111)
Creatinine, Ser: 0.81 mg/dL (ref 0.44–1.00)
GFR calc Af Amer: >60 mL/min (ref >60)
GFR calc non Af Amer: >60 mL/min (ref >60)
Glucose, Bld: 56 mg/dL — ABNORMAL LOW (ref 70–99)
Potassium: 4.7 mmol/L (ref 3.5–5.1)
Sodium: 135 mmol/L (ref 135–145)

## 2018-08-02 NOTE — Progress Notes (Signed)
Obtained BMET, case will be moved to main OR per Dr. Sabra Heck, Ensure pre surgery drink given per Dr. Eusebio Friendly orders.

## 2018-08-02 NOTE — Progress Notes (Signed)
PT came in with Daughter to [facility] for PAT appt. Pre op phone call had not yet been done. PMH and Cardiology notes/ echo reviewed with Dr Sabra Heck. Pt will need to have her surgery done at [facility] main OR. BMET that had been drawn was sent. Michaela at Dr. Eusebio Friendly office notified.

## 2018-08-06 DIAGNOSIS — Z953 Presence of xenogenic heart valve: Secondary | ICD-10-CM | POA: Diagnosis not present

## 2018-08-06 DIAGNOSIS — J9621 Acute and chronic respiratory failure with hypoxia: Secondary | ICD-10-CM | POA: Diagnosis not present

## 2018-08-06 DIAGNOSIS — Z7901 Long term (current) use of anticoagulants: Secondary | ICD-10-CM | POA: Diagnosis not present

## 2018-08-06 DIAGNOSIS — Z5181 Encounter for therapeutic drug level monitoring: Secondary | ICD-10-CM | POA: Diagnosis not present

## 2018-08-06 DIAGNOSIS — M21372 Foot drop, left foot: Secondary | ICD-10-CM | POA: Diagnosis not present

## 2018-08-06 DIAGNOSIS — I25119 Atherosclerotic heart disease of native coronary artery with unspecified angina pectoris: Secondary | ICD-10-CM | POA: Diagnosis not present

## 2018-08-06 DIAGNOSIS — N183 Chronic kidney disease, stage 3 (moderate): Secondary | ICD-10-CM | POA: Diagnosis not present

## 2018-08-06 DIAGNOSIS — I5032 Chronic diastolic (congestive) heart failure: Secondary | ICD-10-CM | POA: Diagnosis not present

## 2018-08-06 DIAGNOSIS — L97521 Non-pressure chronic ulcer of other part of left foot limited to breakdown of skin: Secondary | ICD-10-CM | POA: Diagnosis not present

## 2018-08-06 DIAGNOSIS — I272 Pulmonary hypertension, unspecified: Secondary | ICD-10-CM | POA: Diagnosis not present

## 2018-08-06 DIAGNOSIS — I130 Hypertensive heart and chronic kidney disease with heart failure and stage 1 through stage 4 chronic kidney disease, or unspecified chronic kidney disease: Secondary | ICD-10-CM | POA: Diagnosis not present

## 2018-08-06 DIAGNOSIS — L03116 Cellulitis of left lower limb: Secondary | ICD-10-CM | POA: Diagnosis not present

## 2018-08-06 DIAGNOSIS — I482 Chronic atrial fibrillation, unspecified: Secondary | ICD-10-CM | POA: Diagnosis not present

## 2018-08-06 DIAGNOSIS — I69318 Other symptoms and signs involving cognitive functions following cerebral infarction: Secondary | ICD-10-CM | POA: Diagnosis not present

## 2018-08-06 DIAGNOSIS — J449 Chronic obstructive pulmonary disease, unspecified: Secondary | ICD-10-CM | POA: Diagnosis not present

## 2018-08-06 DIAGNOSIS — Z87891 Personal history of nicotine dependence: Secondary | ICD-10-CM | POA: Diagnosis not present

## 2018-08-06 DIAGNOSIS — F0150 Vascular dementia without behavioral disturbance: Secondary | ICD-10-CM | POA: Diagnosis not present

## 2018-08-08 ENCOUNTER — Telehealth: Payer: Self-pay | Admitting: Plastic Surgery

## 2018-08-08 ENCOUNTER — Other Ambulatory Visit: Payer: Medicare Other | Admitting: Licensed Clinical Social Worker

## 2018-08-08 ENCOUNTER — Other Ambulatory Visit: Payer: Self-pay

## 2018-08-08 DIAGNOSIS — Z515 Encounter for palliative care: Secondary | ICD-10-CM

## 2018-08-08 DIAGNOSIS — R350 Frequency of micturition: Secondary | ICD-10-CM | POA: Diagnosis not present

## 2018-08-08 NOTE — Progress Notes (Signed)
COMMUNITY PALLIATIVE CARE SW NOTE  PATIENT NAME: Brittany [name] DOB: 11/06/1927 MRN: [id number]  PRIMARY CARE PROVIDER: Mayra Neer, MD  RESPONSIBLE PARTY:  Acct ID - Guarantor Home Phone Work Phone Relationship Acct Type  [id number] Brittany, HEINE* [phone]  Self P/F     [address], [place], [place] [address]   Due to the COVID-19 crisis, this virtual check-in visit was done via telephone from my office and it was initiated and consent given by this patient and or family.  PLAN OF CARE and INTERVENTIONS:             1. GOALS OF CARE/ ADVANCE CARE PLANNING:  Goal is for patient not to be hospitalized.  She wishes to remain in there daughter's home.  DNR. 2. SOCIAL/EMOTIONAL/SPIRITUAL ASSESSMENT/ INTERVENTIONS:  SW conducted a virtual check-in visit with patient in her home.  Patient expressed concern about her coumadin level being checked.  She is scheduled to have surgery on her foot next week.  She was able to discuss the process and is looking forward to it healing.  Explored her feelings around having surgery and she stated she was ready.  She does not wish to be in pain any longer.  SW provided active listening and supportive counseling. 3. PATIENT/CAREGIVER EDUCATION/ COPING:  Provided education regarding virtual check-in visits.  She stated she understood. 4. PERSONAL EMERGENCY PLAN:  Family will contact EMS. 5. COMMUNITY RESOURCES COORDINATION/ HEALTH CARE NAVIGATION:  Patient's private caregivers are with her four hours a day Monday through Friday. 6. FINANCIAL/LEGAL CONCERNS/INTERVENTIONS:  None.     SOCIAL HX:  Social History   Tobacco Use  . Smoking status: Former Smoker    Packs/day: 0.50    Years: 40.00    Pack years: 20.00    Types: Cigarettes    Start date: 05/06/1949    Last attempt to quit: 03/06/1990    Years since quitting: 28.4  . Smokeless tobacco: Never Used  Substance Use Topics  . Alcohol use: No    Alcohol/week: 0.0 standard drinks    Comment:  Remote social EtOH    CODE STATUS:  DNR  ADVANCED DIRECTIVES: N MOST FORM COMPLETE:  N HOSPICE EDUCATION PROVIDED:  Patient was under Hospice care previously. PPS:  Patient reports she is eating better.  She uses a walker to ambulate. Duration of visit and documentation:  30 minutes.   Creola Corn Kree Armato, LCSW

## 2018-08-08 NOTE — Telephone Encounter (Signed)
Called patient to confirm appointment scheduled for tomorrow. Patient answered the following questions: °1.Has the patient traveled outside of the state of [place] at all within the past 6 weeks? No °2.Does the patient have a fever or cough at all? No °3.Has the patient been tested for COVID? Had a positive COVID test? No °4. Has the patient been in contact with anyone who has tested positive? No ° °

## 2018-08-09 ENCOUNTER — Ambulatory Visit (INDEPENDENT_AMBULATORY_CARE_PROVIDER_SITE_OTHER): Payer: Medicare Other | Admitting: Plastic Surgery

## 2018-08-09 ENCOUNTER — Telehealth: Payer: Self-pay

## 2018-08-09 ENCOUNTER — Ambulatory Visit (INDEPENDENT_AMBULATORY_CARE_PROVIDER_SITE_OTHER): Payer: Medicare Other | Admitting: Pharmacist

## 2018-08-09 ENCOUNTER — Other Ambulatory Visit: Payer: Self-pay

## 2018-08-09 ENCOUNTER — Encounter: Payer: Self-pay | Admitting: Plastic Surgery

## 2018-08-09 VITALS — BP 119/70 | HR 75 | Temp 97.9°F | Ht 61.0 in | Wt 125.0 lb

## 2018-08-09 DIAGNOSIS — Z5181 Encounter for therapeutic drug level monitoring: Secondary | ICD-10-CM | POA: Diagnosis not present

## 2018-08-09 DIAGNOSIS — I482 Chronic atrial fibrillation, unspecified: Secondary | ICD-10-CM

## 2018-08-09 DIAGNOSIS — L97521 Non-pressure chronic ulcer of other part of left foot limited to breakdown of skin: Secondary | ICD-10-CM | POA: Diagnosis not present

## 2018-08-09 DIAGNOSIS — S91302A Unspecified open wound, left foot, initial encounter: Secondary | ICD-10-CM

## 2018-08-09 DIAGNOSIS — F0150 Vascular dementia without behavioral disturbance: Secondary | ICD-10-CM | POA: Diagnosis not present

## 2018-08-09 DIAGNOSIS — I69318 Other symptoms and signs involving cognitive functions following cerebral infarction: Secondary | ICD-10-CM | POA: Diagnosis not present

## 2018-08-09 DIAGNOSIS — I25119 Atherosclerotic heart disease of native coronary artery with unspecified angina pectoris: Secondary | ICD-10-CM | POA: Diagnosis not present

## 2018-08-09 DIAGNOSIS — L03116 Cellulitis of left lower limb: Secondary | ICD-10-CM | POA: Diagnosis not present

## 2018-08-09 LAB — POCT INR: INR: 1.8 — AB (ref 2.0–3.0)

## 2018-08-09 MED ORDER — DOXYCYCLINE HYCLATE 100 MG PO TABS: 100.0000 mg | ORAL_TABLET | Freq: Two times a day (BID) | ORAL | 0 refills | Status: AC

## 2018-08-09 MED ORDER — HYDROCODONE-ACETAMINOPHEN 5-325 MG PO TABS: 1.0000 | ORAL_TABLET | Freq: Four times a day (QID) | ORAL | 0 refills | Status: AC | PRN

## 2018-08-09 NOTE — Progress Notes (Signed)
Patient ID: Brittany [name], female    DOB: 17-Nov-1927, 85 y.o.   MRN: [id number]   Chief Complaint  Patient presents with  . Skin Problem    The patient is an 85 year old white female here for history and physical for a left foot surgery.  She sustained a wound to the lateral aspect of her left foot over a year ago.  She has been cared for at wound care centers and has tried multiple different treatments.  She was hospitalized twice for cellulitis.  Her most recent regimen has been silver alginate.  She has severe lung disease but overall is doing better and is at home with her daughter.  The patient has been using the silver socks since her last visit.  She is thrilled with the decrease in swelling and marked improvement in pain.  The actual wound has not changed significantly.  There does not appear to be any sign of infection.  The periwound area is markedly improved.  The wound is 1 x 2 cm in size and has a thick eschar on the surface.   Review of Systems  Constitutional: Positive for activity change. Negative for appetite change.  HENT: Negative.   Respiratory: Negative.  Negative for chest tightness.   Cardiovascular: Negative for leg swelling.  Gastrointestinal: Negative.   Genitourinary: Negative.   Musculoskeletal: Negative.   Skin: Positive for wound.  Psychiatric/Behavioral: Negative.     Past Medical History:  Diagnosis Date  . Abnormal liver diagnostic imaging    suspected cirrhosis  . Arthritis   . Atrial fibrillation ([place])   . Chronic back pain   . Diverticulosis   . Dyslipidemia    takes Lipitor daily  . Dysrhythmia    Atrial Fibrillation  . Foot drop    SINCE 1987  . GERD (gastroesophageal reflux disease)   . H/O hiatal hernia   . Hearing impaired    Bilateral hearing aids  . History of bacterial endocarditis   . History of gastritis   . History of stomach ulcers    many yrs ago  . History of TIA (transient ischemic attack)    2013-- RESIDUAL  PERIPHERAL VISION RIGHT EYE--  RESOLVED  . Hypertension   . IC (interstitial cystitis)   . Macular degeneration   . Pulmonary hypertension ([place]) 10/2013   Based on TTE  . S/P aortic valve replacement    2005  . Urine incontinence     Past Surgical History:  Procedure Laterality Date  . ABDOMINAL HYSTERECTOMY  1967  . AORTIC VALVE REPLACEMENT  09-29-2003    [facility] pericardial tissue valve  . APPENDECTOMY  1933  . BIOPSY  02/01/2018   Procedure: BIOPSY;  Surgeon: Rush Landmark Telford Nab., MD;  Location: [place];  Service: Gastroenterology;;  . CARDIAC CATHETERIZATION  07-15-2003 DR HELEN PRESTON   MODERATE TO MODERATELY SEVERE CALCIFIC AORTIC STENOSIS/  NORMAL CORONARY ARTERIES  . CARDIOVASCULAR STRESS TEST  01-30-2012  DR NASHER   NORMAL NUCLEAR STUDY/  EF 73%/  NORMAL LVF  . CARPAL TUNNEL RELEASE Bilateral   . CATARACT EXTRACTION W/ INTRAOCULAR LENS  IMPLANT, BILATERAL    . CHOLECYSTECTOMY  1993  . CYSTO WITH HYDRODISTENSION N/A 02/05/2013   Procedure: CYSTOSCOPY/HYDRODISTENSION;  Surgeon: Irine Seal, MD;  Location: [facility];  Service: Urology;  Laterality: N/A;  . CYSTO/ HYDRODISTENTION/ INSTILLATION CLORPACTIN  MULTIPLE  last one 2009  . DILATION AND CURETTAGE OF UTERUS    . ERCP N/A 08/10/2012  Procedure: ENDOSCOPIC RETROGRADE CHOLANGIOPANCREATOGRAPHY (ERCP);  Surgeon: Inda Castle, MD;  Location: [place];  Service: Gastroenterology;  Laterality: N/A;  . ESOPHAGOGASTRODUODENOSCOPY (EGD) WITH PROPOFOL N/A 08/18/2014   Procedure: ESOPHAGOGASTRODUODENOSCOPY (EGD) WITH PROPOFOL;  Surgeon: Inda Castle, MD;  Location: [facility];  Service: Endoscopy;  Laterality: N/A;  . ESOPHAGOGASTRODUODENOSCOPY (EGD) WITH PROPOFOL N/A 02/01/2018   Procedure: ESOPHAGOGASTRODUODENOSCOPY (EGD) WITH PROPOFOL;  Surgeon: Rush Landmark Telford Nab., MD;  Location: [place];  Service: Gastroenterology;  Laterality: N/A;  . FOREIGN BODY REMOVAL  02/01/2018    Procedure: FOREIGN BODY REMOVAL;  Surgeon: Rush Landmark Telford Nab., MD;  Location: [place];  Service: Gastroenterology;;  . LEFT HEART CATHETERIZATION WITH CORONARY ANGIOGRAM N/A 10/18/2013   Procedure: LEFT HEART CATHETERIZATION WITH CORONARY ANGIOGRAM;  Surgeon: Jettie Booze, MD;  Location: [facility] CATH LAB;  Service: Cardiovascular;  Laterality: N/A;  . LUMBAR [place] &  2007  . MACROPLASTIQUE URETHRAL IMPLANTATION  08-27-2009  . MINI-OPEN RIGHT ROTATOR CUFF REPAIR  07-12-2001  . RIGHT SHOULDER ARTHROSCOPY W/ DEBRIDEMENT ROTATOR CUFF AND LABRAL TEAR/ ACROMINOPLASTY/ DISTAL CLAVICLE EXCISION/ CA LIGAMENT RELEASE  10-08-1999  . TEE WITHOUT CARDIOVERSION N/A 06/20/2018   Procedure: TRANSESOPHAGEAL ECHOCARDIOGRAM (TEE);  Surgeon: Jerline Pain, MD;  Location: [facility] ENDOSCOPY;  Service: Cardiovascular;  Laterality: N/A;  . TONSILLECTOMY AND ADENOIDECTOMY    . TRANSTHORACIC ECHOCARDIOGRAM  08-10-2011   MILD LVH/  EF 55-60%/  NORMAL AVR TISSUE/ MILD MR/  MODERATE DILATED LA/  MILD DILATED RA  . TRANSVAGINAL TAPE PROCEDURE  07-30-2002  . VEIN LIGATION  1969   RIGHT LOWER LEG      Current Outpatient Medications:  .  acetaminophen (TYLENOL) 325 MG tablet, Take 650 mg by mouth daily as needed for mild pain., Disp: , Rfl:  .  atorvastatin (LIPITOR) 20 MG tablet, Take 20 mg by mouth daily., Disp: , Rfl:  .  busPIRone (BUSPAR) 15 MG tablet, Take 1 tablet (15 mg total) by mouth 2 (two) times daily., Disp: 180 tablet, Rfl: 2 .  cholecalciferol (VITAMIN D3) 25 MCG (1000 UT) tablet, Take 1,000 Units by mouth 2 (two) times a day., Disp: , Rfl:  .  cycloSPORINE (RESTASIS) 0.05 % ophthalmic emulsion, Place 1 drop into both eyes 2 (two) times daily., Disp: , Rfl:  .  diazepam (VALIUM) 2 MG tablet, Take 1 tablet (2 mg total) by mouth at bedtime as needed for anxiety., Disp: 10 tablet, Rfl: 0 .  diltiazem (CARDIZEM) 120 MG tablet, Take 120 mg by mouth 2 (two) times daily., Disp: , Rfl:  .   HYDROcodone-acetaminophen (NORCO) 5-325 MG tablet, Take 1 tablet by mouth every 6 (six) hours as needed for up to 5 days., Disp: 20 tablet, Rfl: 0 .  HYDROcodone-acetaminophen (NORCO/VICODIN) 5-325 MG tablet, Take 1 tablet by mouth every 6 (six) hours as needed for severe pain., Disp: 14 tablet, Rfl: 0 .  lisinopril (PRINIVIL,ZESTRIL) 40 MG tablet, Take 20 mg by mouth daily. , Disp: , Rfl:  .  Multiple Vitamins-Minerals (ICAPS AREDS 2) CAPS, Take 1 capsule by mouth 2 (two) times daily., Disp: , Rfl:  .  nitroGLYCERIN (NITROSTAT) 0.4 MG SL tablet, Place 1 tablet (0.4 mg total) under the tongue every 5 (five) minutes as needed for chest pain., Disp: 30 tablet, Rfl: 0 .  ondansetron (ZOFRAN) 8 MG tablet, Take 1 tablet (8 mg total) by mouth 2 (two) times daily. (Patient taking differently: Take 8 mg by mouth every 8 (eight) hours as needed for nausea or vomiting. ),  Disp: 90 tablet, Rfl: 1 .  oxybutynin (DITROPAN) 5 MG tablet, Take 5-10 mg by mouth See admin instructions. 5 mg in the morning, 10 mg at bedtime, Disp: , Rfl:  .  pantoprazole (PROTONIX) 40 MG tablet, Take 40 mg by mouth 2 (two) times daily., Disp: , Rfl:  .  Polyvinyl Alcohol-Povidone (REFRESH OP), Place 1 drop into both eyes 2 (two) times daily., Disp: , Rfl:  .  vitamin C (ASCORBIC ACID) 500 MG tablet, Take 500 mg by mouth 2 (two) times daily., Disp: , Rfl:  .  warfarin (COUMADIN) 5 MG tablet, Take as directed by [facility] (Patient taking differently: Take 2.5-5 mg by mouth daily at 6 PM. Take 5mg  by mouth daily at 6pm. Take 2.5mg  by mouth on T/Th.), Disp: 30 tablet, Rfl: 2 .  zinc sulfate 220 (50 Zn) MG capsule, Take 1 capsule (220 mg total) by mouth daily., Disp: 30 capsule, Rfl: 0   Objective:   Vitals:   08/09/18 1026  BP: 119/70  Pulse: 75  Temp: 97.9 F (36.6 C)  SpO2: 99%    Physical Exam Vitals signs and nursing note reviewed.  Constitutional:      Appearance: Normal appearance.  HENT:     Head: Normocephalic  and atraumatic.     Mouth/Throat:     Mouth: Mucous membranes are moist.  Cardiovascular:     Rate and Rhythm: Normal rate.  Neurological:     Mental Status: She is alert and oriented to person, place, and time.  Psychiatric:        Mood and Affect: Mood normal.     Assessment & Plan:  Open wound of left foot, initial encounter   Plan for excisional debridement of left foot wound with ACell placement.  Not planning on the [facility] at this time. Prescriptions sent into pharmacy. A picture placed in the patient's chart with the patient's permission.  [place], DO

## 2018-08-09 NOTE — Telephone Encounter (Signed)
Orders faxed to [facility] for post op care Per Dr. Marla Roe: [place] dressing to left foot daily  [place]

## 2018-08-09 NOTE — H&P (View-Only) (Signed)
Patient ID: Brittany [name], female    DOB: 10-Apr-1928, 85 y.o.   MRN: [id number]   Chief Complaint  Patient presents with  . Skin Problem    The patient is an 85 year old white female here for history and physical for a left foot surgery.  She sustained a wound to the lateral aspect of her left foot over a year ago.  She has been cared for at wound care centers and has tried multiple different treatments.  She was hospitalized twice for cellulitis.  Her most recent regimen has been silver alginate.  She has severe lung disease but overall is doing better and is at home with her daughter.  The patient has been using the silver socks since her last visit.  She is thrilled with the decrease in swelling and marked improvement in pain.  The actual wound has not changed significantly.  There does not appear to be any sign of infection.  The periwound area is markedly improved.  The wound is 1 x 2 cm in size and has a thick eschar on the surface.   Review of Systems  Constitutional: Positive for activity change. Negative for appetite change.  HENT: Negative.   Respiratory: Negative.  Negative for chest tightness.   Cardiovascular: Negative for leg swelling.  Gastrointestinal: Negative.   Genitourinary: Negative.   Musculoskeletal: Negative.   Skin: Positive for wound.  Psychiatric/Behavioral: Negative.     Past Medical History:  Diagnosis Date  . Abnormal liver diagnostic imaging    suspected cirrhosis  . Arthritis   . Atrial fibrillation ([place])   . Chronic back pain   . Diverticulosis   . Dyslipidemia    takes Lipitor daily  . Dysrhythmia    Atrial Fibrillation  . Foot drop    SINCE 1987  . GERD (gastroesophageal reflux disease)   . H/O hiatal hernia   . Hearing impaired    Bilateral hearing aids  . History of bacterial endocarditis   . History of gastritis   . History of stomach ulcers    many yrs ago  . History of TIA (transient ischemic attack)    2013-- RESIDUAL  PERIPHERAL VISION RIGHT EYE--  RESOLVED  . Hypertension   . IC (interstitial cystitis)   . Macular degeneration   . Pulmonary hypertension ([place]) 10/2013   Based on TTE  . S/P aortic valve replacement    2005  . Urine incontinence     Past Surgical History:  Procedure Laterality Date  . ABDOMINAL HYSTERECTOMY  1967  . AORTIC VALVE REPLACEMENT  09-29-2003    [facility] pericardial tissue valve  . APPENDECTOMY  1933  . BIOPSY  02/01/2018   Procedure: BIOPSY;  Surgeon: Rush Landmark Telford Nab., MD;  Location: [place];  Service: Gastroenterology;;  . CARDIAC CATHETERIZATION  07-15-2003 DR HELEN PRESTON   MODERATE TO MODERATELY SEVERE CALCIFIC AORTIC STENOSIS/  NORMAL CORONARY ARTERIES  . CARDIOVASCULAR STRESS TEST  01-30-2012  DR NASHER   NORMAL NUCLEAR STUDY/  EF 73%/  NORMAL LVF  . CARPAL TUNNEL RELEASE Bilateral   . CATARACT EXTRACTION W/ INTRAOCULAR LENS  IMPLANT, BILATERAL    . CHOLECYSTECTOMY  1993  . CYSTO WITH HYDRODISTENSION N/A 02/05/2013   Procedure: CYSTOSCOPY/HYDRODISTENSION;  Surgeon: Irine Seal, MD;  Location: [facility];  Service: Urology;  Laterality: N/A;  . CYSTO/ HYDRODISTENTION/ INSTILLATION CLORPACTIN  MULTIPLE  last one 2009  . DILATION AND CURETTAGE OF UTERUS    . ERCP N/A 08/10/2012  Procedure: ENDOSCOPIC RETROGRADE CHOLANGIOPANCREATOGRAPHY (ERCP);  Surgeon: Inda Castle, MD;  Location: [place];  Service: Gastroenterology;  Laterality: N/A;  . ESOPHAGOGASTRODUODENOSCOPY (EGD) WITH PROPOFOL N/A 08/18/2014   Procedure: ESOPHAGOGASTRODUODENOSCOPY (EGD) WITH PROPOFOL;  Surgeon: Inda Castle, MD;  Location: [facility];  Service: Endoscopy;  Laterality: N/A;  . ESOPHAGOGASTRODUODENOSCOPY (EGD) WITH PROPOFOL N/A 02/01/2018   Procedure: ESOPHAGOGASTRODUODENOSCOPY (EGD) WITH PROPOFOL;  Surgeon: Rush Landmark Telford Nab., MD;  Location: [place];  Service: Gastroenterology;  Laterality: N/A;  . FOREIGN BODY REMOVAL  02/01/2018    Procedure: FOREIGN BODY REMOVAL;  Surgeon: Rush Landmark Telford Nab., MD;  Location: [place];  Service: Gastroenterology;;  . LEFT HEART CATHETERIZATION WITH CORONARY ANGIOGRAM N/A 10/18/2013   Procedure: LEFT HEART CATHETERIZATION WITH CORONARY ANGIOGRAM;  Surgeon: Jettie Booze, MD;  Location: [facility] CATH LAB;  Service: Cardiovascular;  Laterality: N/A;  . LUMBAR [place] &  2007  . MACROPLASTIQUE URETHRAL IMPLANTATION  08-27-2009  . MINI-OPEN RIGHT ROTATOR CUFF REPAIR  07-12-2001  . RIGHT SHOULDER ARTHROSCOPY W/ DEBRIDEMENT ROTATOR CUFF AND LABRAL TEAR/ ACROMINOPLASTY/ DISTAL CLAVICLE EXCISION/ CA LIGAMENT RELEASE  10-08-1999  . TEE WITHOUT CARDIOVERSION N/A 06/20/2018   Procedure: TRANSESOPHAGEAL ECHOCARDIOGRAM (TEE);  Surgeon: Jerline Pain, MD;  Location: [facility] ENDOSCOPY;  Service: Cardiovascular;  Laterality: N/A;  . TONSILLECTOMY AND ADENOIDECTOMY    . TRANSTHORACIC ECHOCARDIOGRAM  08-10-2011   MILD LVH/  EF 55-60%/  NORMAL AVR TISSUE/ MILD MR/  MODERATE DILATED LA/  MILD DILATED RA  . TRANSVAGINAL TAPE PROCEDURE  07-30-2002  . VEIN LIGATION  1969   RIGHT LOWER LEG      Current Outpatient Medications:  .  acetaminophen (TYLENOL) 325 MG tablet, Take 650 mg by mouth daily as needed for mild pain., Disp: , Rfl:  .  atorvastatin (LIPITOR) 20 MG tablet, Take 20 mg by mouth daily., Disp: , Rfl:  .  busPIRone (BUSPAR) 15 MG tablet, Take 1 tablet (15 mg total) by mouth 2 (two) times daily., Disp: 180 tablet, Rfl: 2 .  cholecalciferol (VITAMIN D3) 25 MCG (1000 UT) tablet, Take 1,000 Units by mouth 2 (two) times a day., Disp: , Rfl:  .  cycloSPORINE (RESTASIS) 0.05 % ophthalmic emulsion, Place 1 drop into both eyes 2 (two) times daily., Disp: , Rfl:  .  diazepam (VALIUM) 2 MG tablet, Take 1 tablet (2 mg total) by mouth at bedtime as needed for anxiety., Disp: 10 tablet, Rfl: 0 .  diltiazem (CARDIZEM) 120 MG tablet, Take 120 mg by mouth 2 (two) times daily., Disp: , Rfl:  .   HYDROcodone-acetaminophen (NORCO) 5-325 MG tablet, Take 1 tablet by mouth every 6 (six) hours as needed for up to 5 days., Disp: 20 tablet, Rfl: 0 .  HYDROcodone-acetaminophen (NORCO/VICODIN) 5-325 MG tablet, Take 1 tablet by mouth every 6 (six) hours as needed for severe pain., Disp: 14 tablet, Rfl: 0 .  lisinopril (PRINIVIL,ZESTRIL) 40 MG tablet, Take 20 mg by mouth daily. , Disp: , Rfl:  .  Multiple Vitamins-Minerals (ICAPS AREDS 2) CAPS, Take 1 capsule by mouth 2 (two) times daily., Disp: , Rfl:  .  nitroGLYCERIN (NITROSTAT) 0.4 MG SL tablet, Place 1 tablet (0.4 mg total) under the tongue every 5 (five) minutes as needed for chest pain., Disp: 30 tablet, Rfl: 0 .  ondansetron (ZOFRAN) 8 MG tablet, Take 1 tablet (8 mg total) by mouth 2 (two) times daily. (Patient taking differently: Take 8 mg by mouth every 8 (eight) hours as needed for nausea or vomiting. ),  Disp: 90 tablet, Rfl: 1 .  oxybutynin (DITROPAN) 5 MG tablet, Take 5-10 mg by mouth See admin instructions. 5 mg in the morning, 10 mg at bedtime, Disp: , Rfl:  .  pantoprazole (PROTONIX) 40 MG tablet, Take 40 mg by mouth 2 (two) times daily., Disp: , Rfl:  .  Polyvinyl Alcohol-Povidone (REFRESH OP), Place 1 drop into both eyes 2 (two) times daily., Disp: , Rfl:  .  vitamin C (ASCORBIC ACID) 500 MG tablet, Take 500 mg by mouth 2 (two) times daily., Disp: , Rfl:  .  warfarin (COUMADIN) 5 MG tablet, Take as directed by [facility]mad[facility] Clinic (Patient taking differently: Take 2.5-5 mg by mouth daily at 6 PM. Take 5mg  by mouth daily at 6pm. Take 2.5mg  by mouth on T/Th.), Disp: 30 tablet, Rfl: 2 .  zinc sulfate 220 (50 Zn) MG capsule, Take 1 capsule (220 mg total) by mouth daily., Disp: 30 capsule, Rfl: 0   Objective:   Vitals:   08/09/18 1026  BP: 119/70  Pulse: 75  Temp: 97.9 F (36.6 C)  SpO2: 99%    Physical Exam Vitals signs and nursing note reviewed.  Constitutional:      Appearance: Normal appearance.  HENT:     Head: Normocephalic  and atraumatic.     Mouth/Throat:     Mouth: Mucous membranes are moist.  Cardiovascular:     Rate and Rhythm: Normal rate.  Neurological:     Mental Status: She is alert and oriented to person, place, and time.  Psychiatric:        Mood and Affect: Mood normal.     Assessment & Plan:  Open wound of left foot, initial encounter   Plan for excisional debridement of left foot wound with ACell placement.  Not planning on the [facility] at this time. Prescriptions sent into pharmacy. A picture placed in the patient's chart with the patient's permission.  [place], DO

## 2018-08-10 ENCOUNTER — Encounter: Payer: Medicare Other | Admitting: Plastic Surgery

## 2018-08-13 ENCOUNTER — Telehealth: Payer: Self-pay | Admitting: *Deleted

## 2018-08-13 NOTE — Telephone Encounter (Signed)
Received a call from the patient's daughter and she stated that the patient was seen by [place] on last Thursday, and the patient is to have surgery this Wednesday.  But she feels the patient will not be able to have surgery on Wednesday.  She said the patient is confused, disoriented,weak,and having difficult standing.  She stated that the patient is into 4-5 days been diagnosed with a UTI and placed on an antibiotic on last Friday by her PCP (Dr. Brigitte Pulse).  The patient has taken:1 tab on Friday,2 tablets on Sat and Sun, and 1 tablet this morning.  The daughter stated that the patient has swelling on the left toes, and cellulitis on left upper calf.  She noticed the swelling and cellulitis this morning before the patient's shower.  Informed the daughter that I will inform Dr. Marla Roe of this message.  Spoke with Dr. Marla Roe and she stated to let the daughter know:Thanks for letting us know, she hopes the antibiotic helps her get better and she feels better.  Also let the daughter know we will call and have the surgery postponed.  The daughter verbalized understanding and agreed.//AB/CMA

## 2018-08-14 ENCOUNTER — Telehealth: Payer: Self-pay

## 2018-08-14 DIAGNOSIS — B3781 Candidal esophagitis: Secondary | ICD-10-CM

## 2018-08-14 DIAGNOSIS — K7460 Unspecified cirrhosis of liver: Secondary | ICD-10-CM

## 2018-08-14 DIAGNOSIS — K259 Gastric ulcer, unspecified as acute or chronic, without hemorrhage or perforation: Secondary | ICD-10-CM

## 2018-08-14 DIAGNOSIS — K2100 Gastro-esophageal reflux disease with esophagitis, without bleeding: Secondary | ICD-10-CM

## 2018-08-14 NOTE — Telephone Encounter (Signed)
Jan looking through chart it appears she is now on palliative care, although unclear what extent she wishes to receive care. Can you touch base with them to see if she still wishes to be seen, can do a virtual visit next month or so for routine follow up if she wishes. thanks

## 2018-08-14 NOTE — Telephone Encounter (Signed)
Please see labs pt had in March and April of 2020.

## 2018-08-14 NOTE — Telephone Encounter (Signed)
-----   Message from Roetta Sessions, [place] sent at 07/11/2018 10:51 AM EDT ----- Regarding: FW: labs Pt is 67 - shouldn't go to the lab now if she doesn't have to.  Go to lab in May   ----- Message ----- From: Roetta Sessions, CMA Sent: 07/11/2018 To: Roetta Sessions, CMA Subject: FW: labs                                       Cmet and cbc due early April 2020  ----- Message ----- From: Yetta Flock, MD Sent: 03/23/2018   5:37 PM EST To: Roetta Sessions, CMA Subject: RE: labs                                       Sorry for the late response. Yes why don't we push them back another 3-4 months. Thanks ----- Message ----- From: Roetta Sessions, CMA Sent: 03/23/2018   4:55 PM EST To: Yetta Flock, MD Subject: FW: labs                                       I think I already asked you about this but I don't see the message. Sorry. Just to clarify ....  Do you want pt to have these labs this month (cbc and cmet)?  She had labs in February 02, 2018.  Want to push them out a little further? Thanks, Jan   ----- Message ----- From: Roetta Sessions, CMA Sent: 03/22/2018 To: Roetta Sessions, CMA Subject: FW: labs                                       cmet and cbc due in December 2019    ----- Message ----- From: Yetta Flock, MD Sent: 09/26/2017   1:13 PM To: Roetta Sessions, CMA Subject: RE: labs                                       Thanks Burnis Medin labs for June, would do it otherwise in 6 months. Thanks  ----- Message ----- From: Roetta Sessions, CMA Sent: 09/26/2017   9:39 AM To: Yetta Flock, MD Subject: labs                                           I have a message this pt was to be due for labs in June (cbc and cmet).  She had a CBC on 5-15 and a BMP on 4-29.  Please advise. Thanks, Jan

## 2018-08-17 NOTE — Telephone Encounter (Signed)
Left message asking for return call.  

## 2018-08-21 NOTE — Addendum Note (Signed)
Addended by: Roetta Sessions on: 08/21/2018 01:42 PM   Modules accepted: Orders

## 2018-08-21 NOTE — Telephone Encounter (Signed)
Called and spoke to pt.  She said she is doing well on her present medications and will call us to do a virtual visit if she needs one.

## 2018-08-23 ENCOUNTER — Encounter: Payer: Medicare Other | Admitting: Plastic Surgery

## 2018-08-24 ENCOUNTER — Telehealth: Payer: Self-pay | Admitting: Cardiovascular Disease

## 2018-08-24 ENCOUNTER — Ambulatory Visit (INDEPENDENT_AMBULATORY_CARE_PROVIDER_SITE_OTHER): Payer: Medicare Other | Admitting: Pharmacist

## 2018-08-24 DIAGNOSIS — L97521 Non-pressure chronic ulcer of other part of left foot limited to breakdown of skin: Secondary | ICD-10-CM | POA: Diagnosis not present

## 2018-08-24 DIAGNOSIS — Z5181 Encounter for therapeutic drug level monitoring: Secondary | ICD-10-CM

## 2018-08-24 DIAGNOSIS — L03116 Cellulitis of left lower limb: Secondary | ICD-10-CM | POA: Diagnosis not present

## 2018-08-24 DIAGNOSIS — I69318 Other symptoms and signs involving cognitive functions following cerebral infarction: Secondary | ICD-10-CM | POA: Diagnosis not present

## 2018-08-24 DIAGNOSIS — F0150 Vascular dementia without behavioral disturbance: Secondary | ICD-10-CM | POA: Diagnosis not present

## 2018-08-24 DIAGNOSIS — I482 Chronic atrial fibrillation, unspecified: Secondary | ICD-10-CM | POA: Diagnosis not present

## 2018-08-24 DIAGNOSIS — I25119 Atherosclerotic heart disease of native coronary artery with unspecified angina pectoris: Secondary | ICD-10-CM | POA: Diagnosis not present

## 2018-08-24 LAB — POCT INR: INR: 1.4 — AB (ref 2.0–3.0)

## 2018-08-24 NOTE — Telephone Encounter (Signed)
New Message   Mo is calling and is at the Pts home with the INR results   INR Results  1.4 and 17.8

## 2018-08-24 NOTE — Telephone Encounter (Signed)
See anticoag encounter for additional details.

## 2018-08-27 ENCOUNTER — Telehealth: Payer: Self-pay | Admitting: Licensed Clinical Social Worker

## 2018-08-27 NOTE — Telephone Encounter (Signed)
Palliative Care SW phoned patient to schedule a virtual check-in visit.  She stated she was taking a nap and asked for SW to call another time.

## 2018-08-28 ENCOUNTER — Other Ambulatory Visit: Payer: Medicare Other | Admitting: Licensed Clinical Social Worker

## 2018-08-28 ENCOUNTER — Encounter: Payer: Medicare Other | Admitting: Plastic Surgery

## 2018-08-28 ENCOUNTER — Other Ambulatory Visit: Payer: Self-pay

## 2018-08-28 DIAGNOSIS — Z515 Encounter for palliative care: Secondary | ICD-10-CM

## 2018-08-29 NOTE — Progress Notes (Signed)
COMMUNITY PALLIATIVE CARE SW NOTE  PATIENT NAME: Brittany [name] DOB: Apr 09, 1928 MRN: [id number]  PRIMARY CARE PROVIDER: Mayra Neer, MD  RESPONSIBLE PARTY:  Acct ID - Guarantor Home Phone Work Phone Relationship Acct Type  [id number] Brittany [name], YERIAN* [phone]  Self P/F     [address] [place], [place] [address]    Due to the COVID-19 crisis, this virtual check-in visit was done via telephone from my office and it was initiated and consent given by this patient and or family.  PLAN OF CARE and INTERVENTIONS:             1. GOALS OF CARE/ ADVANCE CARE PLANNING:  Patient's goal is to remain at her daughter's home and to not be hospitalized.  She has a DNR. 2. SOCIAL/EMOTIONAL/SPIRITUAL ASSESSMENT/ INTERVENTIONS:  SW conducted virtual check-in visit with patient in her daughter's home.  SW provided active listening and supportive counseling while she discussed her upcoming surgery on her foot.  She stated she was going to be "put to sleep" so that the plastic surgeon can thoroughly clean the wound.  She is looking forward to resolution with her wound. 3. PATIENT/CAREGIVER EDUCATION/ COPING:  Patient expresses her feelings openly.  She also used humor during this visit. 4. PERSONAL EMERGENCY PLAN:  Family contacts EMS 5. COMMUNITY RESOURCES COORDINATION/ HEALTH CARE NAVIGATION:  Patient has private caregivers Monday through Friday for four hours a day. 6. FINANCIAL/LEGAL CONCERNS/INTERVENTIONS:  None.     SOCIAL HX:  Social History   Tobacco Use  . Smoking status: Former Smoker    Packs/day: 0.50    Years: 40.00    Pack years: 20.00    Types: Cigarettes    Start date: 05/06/1949    Last attempt to quit: 03/06/1990    Years since quitting: 28.5  . Smokeless tobacco: Never Used  Substance Use Topics  . Alcohol use: No    Alcohol/week: 0.0 standard drinks    Comment: Remote social EtOH    CODE STATUS:  DNR  ADVANCED DIRECTIVES: N MOST FORM COMPLETE:  N HOSPICE EDUCATION  PROVIDED:  She was previously under Hospice care. PPS:  She reports her appetite is normal.  She uses a walker to ambulate. Duration of visit and documentation:  30 minutes.      Creola Corn Kasandra Fehr, LCSW

## 2018-08-31 ENCOUNTER — Ambulatory Visit (INDEPENDENT_AMBULATORY_CARE_PROVIDER_SITE_OTHER): Payer: Medicare Other | Admitting: Pharmacist Clinician (PhC)/ Clinical Pharmacy Specialist

## 2018-08-31 DIAGNOSIS — I482 Chronic atrial fibrillation, unspecified: Secondary | ICD-10-CM

## 2018-08-31 DIAGNOSIS — L97521 Non-pressure chronic ulcer of other part of left foot limited to breakdown of skin: Secondary | ICD-10-CM | POA: Diagnosis not present

## 2018-08-31 DIAGNOSIS — M48061 Spinal stenosis, lumbar region without neurogenic claudication: Secondary | ICD-10-CM | POA: Diagnosis not present

## 2018-08-31 DIAGNOSIS — Z5181 Encounter for therapeutic drug level monitoring: Secondary | ICD-10-CM

## 2018-08-31 DIAGNOSIS — I25119 Atherosclerotic heart disease of native coronary artery with unspecified angina pectoris: Secondary | ICD-10-CM | POA: Diagnosis not present

## 2018-08-31 DIAGNOSIS — F0150 Vascular dementia without behavioral disturbance: Secondary | ICD-10-CM | POA: Diagnosis not present

## 2018-08-31 DIAGNOSIS — L03116 Cellulitis of left lower limb: Secondary | ICD-10-CM | POA: Diagnosis not present

## 2018-08-31 DIAGNOSIS — M542 Cervicalgia: Secondary | ICD-10-CM | POA: Diagnosis not present

## 2018-08-31 DIAGNOSIS — I69318 Other symptoms and signs involving cognitive functions following cerebral infarction: Secondary | ICD-10-CM | POA: Diagnosis not present

## 2018-08-31 LAB — POCT INR: INR: 1.7 — AB (ref 2.0–3.0)

## 2018-09-04 ENCOUNTER — Other Ambulatory Visit: Payer: Self-pay

## 2018-09-04 ENCOUNTER — Encounter (HOSPITAL_COMMUNITY): Payer: Self-pay | Admitting: *Deleted

## 2018-09-04 ENCOUNTER — Other Ambulatory Visit (HOSPITAL_COMMUNITY)
Admission: RE | Admit: 2018-09-04 | Discharge: 2018-09-04 | Disposition: A | Discharge status: Home / Self Care | Payer: Medicare Other | Source: Ambulatory Visit | Attending: Plastic Surgery | Admitting: Plastic Surgery

## 2018-09-04 DIAGNOSIS — Z1159 Encounter for screening for other viral diseases: Secondary | ICD-10-CM | POA: Insufficient documentation

## 2018-09-04 LAB — SARS CORONAVIRUS 2 BY RT PCR (HOSPITAL ORDER, PERFORMED IN ~~LOC~~ HOSPITAL LAB): SARS Coronavirus 2: NEGATIVE

## 2018-09-04 NOTE — Progress Notes (Signed)
Spoke with Brittany [name] and her daughter, Lind Covert for pre-op call. Brittany [name] has hx of A-fib and Dr. Acie Fredrickson is her cardiologist. Brittany [name] is on Coumadin and states no one has instructed her to stop it prior to surgery. I have called and left a message on Dr. Eusebio Friendly office voicemail. Brittany [name] states she is not diabetic. Will have Anesthesia review chart.  Brittany [name] had Covid 19 testing done today. She states she knows to stay at home and guarantine until day of surgery.    Coronavirus Screening  Have you experienced the following symptoms:  Cough NO Fever (>100.35F)  NO Runny nose NO Sore throat NO Difficulty breathing/shortness of breath  NO  Have you or a family member traveled in the last 14 days and where? NO    Patient and daughter reminded that hospital visitation restrictions are in effect and the importance of the restrictions.

## 2018-09-05 ENCOUNTER — Telehealth: Payer: Self-pay | Admitting: Plastic Surgery

## 2018-09-05 ENCOUNTER — Telehealth: Payer: Self-pay | Admitting: Cardiovascular Disease

## 2018-09-05 DIAGNOSIS — N183 Chronic kidney disease, stage 3 (moderate): Secondary | ICD-10-CM | POA: Diagnosis not present

## 2018-09-05 DIAGNOSIS — Z7901 Long term (current) use of anticoagulants: Secondary | ICD-10-CM | POA: Diagnosis not present

## 2018-09-05 DIAGNOSIS — Z953 Presence of xenogenic heart valve: Secondary | ICD-10-CM | POA: Diagnosis not present

## 2018-09-05 DIAGNOSIS — J449 Chronic obstructive pulmonary disease, unspecified: Secondary | ICD-10-CM | POA: Diagnosis not present

## 2018-09-05 DIAGNOSIS — J9611 Chronic respiratory failure with hypoxia: Secondary | ICD-10-CM | POA: Diagnosis not present

## 2018-09-05 DIAGNOSIS — I69318 Other symptoms and signs involving cognitive functions following cerebral infarction: Secondary | ICD-10-CM | POA: Diagnosis not present

## 2018-09-05 DIAGNOSIS — Z87891 Personal history of nicotine dependence: Secondary | ICD-10-CM | POA: Diagnosis not present

## 2018-09-05 DIAGNOSIS — F0150 Vascular dementia without behavioral disturbance: Secondary | ICD-10-CM | POA: Diagnosis not present

## 2018-09-05 DIAGNOSIS — I272 Pulmonary hypertension, unspecified: Secondary | ICD-10-CM | POA: Diagnosis not present

## 2018-09-05 DIAGNOSIS — Z48817 Encounter for surgical aftercare following surgery on the skin and subcutaneous tissue: Secondary | ICD-10-CM | POA: Diagnosis not present

## 2018-09-05 DIAGNOSIS — I130 Hypertensive heart and chronic kidney disease with heart failure and stage 1 through stage 4 chronic kidney disease, or unspecified chronic kidney disease: Secondary | ICD-10-CM | POA: Diagnosis not present

## 2018-09-05 DIAGNOSIS — I25119 Atherosclerotic heart disease of native coronary artery with unspecified angina pectoris: Secondary | ICD-10-CM | POA: Diagnosis not present

## 2018-09-05 DIAGNOSIS — I482 Chronic atrial fibrillation, unspecified: Secondary | ICD-10-CM | POA: Diagnosis not present

## 2018-09-05 DIAGNOSIS — Z5181 Encounter for therapeutic drug level monitoring: Secondary | ICD-10-CM | POA: Diagnosis not present

## 2018-09-05 DIAGNOSIS — I5032 Chronic diastolic (congestive) heart failure: Secondary | ICD-10-CM | POA: Diagnosis not present

## 2018-09-05 DIAGNOSIS — M21372 Foot drop, left foot: Secondary | ICD-10-CM | POA: Diagnosis not present

## 2018-09-05 NOTE — Telephone Encounter (Signed)
Fax for Request for Surgical Clearance unable to go through x (2).  Called and spoke with the nurse at Dr. Elmarie Shiley office (Cardiologist) and told her I was unable to get the fax for request for surgical clearance through, so she took the request and processed it over the phone.  Asked the nurse if they will contact the patient and she stated that they will contact the patient.//AB/CMA

## 2018-09-05 NOTE — Telephone Encounter (Signed)
   Primary Cardiologist: Mertie Moores, MD  Chart reviewed as part of pre-operative protocol coverage. Patient was contacted 09/05/2018 in reference to pre-operative risk assessment for pending surgery as outlined below.  Brittany [name] was last seen on 07/13/18 by Brittany Perch NP.  Since that day, Brittany [name] has done well. I called and spoke with her daughter Brittany [name] who is her primary care giver. She uses a walker and can get around the house but can't complete 4.0 METS. She denies any new or worsening cardiac symptoms - inlcuding anginal symptoms and signs of volume overload.   Per our pharmacy staff: Per office policy. No need to hold warfarin for most skin procedures unless requested by surgeon.  Most recent INR was 1.7 on 08/31/2018.    Therefore, based on ACC/AHA guidelines, the patient would be at acceptable risk for the planned procedure without further cardiovascular testing.   I will route this recommendation to the requesting party via Epic fax function and remove from pre-op pool.  Please call with questions.  [place], PA 09/05/2018, 2:00 PM

## 2018-09-05 NOTE — Anesthesia Preprocedure Evaluation (Addendum)
Anesthesia Evaluation  Patient identified by MRN, date of birth, ID band  Reviewed: Allergy & Precautions, NPO status , Patient's Chart, lab work & pertinent test results  History of Anesthesia Complications Negative for: history of anesthetic complications  Airway Mallampati: II  TM Distance: >3 FB Neck ROM: Full    Dental  (+) Dental Advisory Given, Upper Dentures, Lower Dentures   Pulmonary shortness of breath, COPD, former smoker,    breath sounds clear to auscultation       Cardiovascular hypertension, Pt. on medications + CAD, + Past MI, + Cardiac Stents and +CHF  + dysrhythmias Atrial Fibrillation + Valvular Problems/Murmurs  Rhythm:Regular + Systolic murmurs    Neuro/Psych  Headaches, PSYCHIATRIC DISORDERS Dementia CVA, Residual Symptoms    GI/Hepatic hiatal hernia, GERD  ,  Endo/Other    Renal/GU CRFRenal disease     Musculoskeletal  (+) Arthritis ,   Abdominal   Peds  Hematology  (+) Blood dyscrasia, anemia ,   Anesthesia Other Findings  1. The left ventricle has normal systolic function, with an ejection fraction of 60-65%. The cavity size was normal.  2. The right ventricle has normal systolc function. The cavity was normal. There is no increase in right ventricular wall thickness.  3. Left atrial size was severely dilated.  4. Right atrial size was severely dilated.  5. Moderate mitral regurgitation, PISA 0.7, ERO 0.15, no PV flow reversal.  6. Tricuspid valve regurgitation is moderate-severe.  7. A an Edwards bioprosthesis valve is present in the aortic position. Procedure Date: 2005 Echo findings are consistent with thickening of the aortic prosthesis.  8. Pulmonic valve regurgitation was not assessed by color flow Doppler.  9. There is evidence of moderate plaque in the descending aorta.  Reproductive/Obstetrics                          Anesthesia Physical Anesthesia Plan  ASA:  III  Anesthesia Plan: General   Post-op Pain Management:    Induction: Intravenous  PONV Risk Score and Plan: 3 and Ondansetron and Dexamethasone  Airway Management Planned: LMA  Additional Equipment: None  Intra-op Plan:   Post-operative Plan: Extubation in OR  Informed Consent: I have reviewed the patients History and Physical, chart, labs and discussed the procedure including the risks, benefits and alternatives for the proposed anesthesia with the patient or authorized representative who has indicated his/her understanding and acceptance.     Dental advisory given  Plan Discussed with: CRNA and Surgeon  Anesthesia Plan Comments: (Follows with cardiology for hx of AVR (Edwards tissue valve 2005), CAD s/p DES to LAD 2015, prior stroke with right visual field defect, and chronic atrial fibrillation on warfarin. Recently Hospitalized 3/14-3/17 for shortness of breath, CAP and volume overload. Diuresed. Renal function and K+ good at discharge. EF >65% on TTE and 60-65% on TEE with well seated tissue AVR with no vegetations. Seen by cardiology 07/13/2018 and per OV note "Patient Risk:  After full review of this patient's clinical status, I feel that they are at least moderate risk at this time." Cleared for surgery in telephone encounter 09/05/18 that also stated warfarin did not need to be held for skin procedure.  Pt is now on palliative care with goal of remaining at home with daughter and avoiding hospitalizations.  )     Anesthesia Quick Evaluation

## 2018-09-05 NOTE — Telephone Encounter (Signed)
Received voicemail from Sheppard Penton, RN with pre-admission testing. Patient is currently taking coumadin. Does the patient need to stop this prior to surgery or continue through surgery? Best call back number is [phone]

## 2018-09-05 NOTE — Telephone Encounter (Signed)
Faxed request for surgical clearance to Dr. Nahser-Cardiologist for instructions for the patient's Coumadin.//AB/CMA

## 2018-09-05 NOTE — Telephone Encounter (Signed)
Per office policy. No need to hold warfarin for most skin procedures unless requested by surgeon.  Most recent INR was 1.7 on 08/31/2018.

## 2018-09-05 NOTE — Telephone Encounter (Signed)
Please comment on coumadin. 

## 2018-09-05 NOTE — Telephone Encounter (Signed)
     [facility] Pre-operative Risk Assessment    Request for surgical clearance:  1. What type of surgery is being performed? Incisional debridement of left foot wound acell placement  2. When is this surgery scheduled? 09/06/18 9:45 am  3. What type of clearance is required (medical clearance vs. Pharmacy clearance to hold med vs. Both)? both 4.  5. Are there any medications that need to be held prior to surgery and how long? Coumadin, stopped or bridge med?  6. Practice name and name of physician performing surgery? [facility]  7. What is your office phone number [phone]   7.   What is your office fax number [phone]  8.   Anesthesia type (None, local, MAC, general) ? general   Gwendel Hanson 09/05/2018, 11:47 AM  _________________________________________________________________   (provider comments below)

## 2018-09-06 ENCOUNTER — Encounter (HOSPITAL_COMMUNITY): Payer: Self-pay

## 2018-09-06 ENCOUNTER — Encounter (HOSPITAL_COMMUNITY)
Admission: RE | Disposition: A | Discharge status: Home / Self Care | Payer: Self-pay | Source: Home / Self Care | Attending: Plastic Surgery

## 2018-09-06 ENCOUNTER — Other Ambulatory Visit: Payer: Self-pay

## 2018-09-06 ENCOUNTER — Ambulatory Visit (HOSPITAL_COMMUNITY): Payer: Medicare Other | Admitting: Physician Assistant

## 2018-09-06 ENCOUNTER — Ambulatory Visit (HOSPITAL_COMMUNITY)
Admission: RE | Admit: 2018-09-06 | Discharge: 2018-09-06 | Disposition: A | Discharge status: Home / Self Care | Payer: Medicare Other | Attending: Plastic Surgery | Admitting: Plastic Surgery

## 2018-09-06 DIAGNOSIS — L859 Epidermal thickening, unspecified: Secondary | ICD-10-CM | POA: Diagnosis not present

## 2018-09-06 DIAGNOSIS — L905 Scar conditions and fibrosis of skin: Secondary | ICD-10-CM | POA: Diagnosis not present

## 2018-09-06 DIAGNOSIS — N183 Chronic kidney disease, stage 3 (moderate): Secondary | ICD-10-CM | POA: Diagnosis not present

## 2018-09-06 DIAGNOSIS — I4891 Unspecified atrial fibrillation: Secondary | ICD-10-CM | POA: Insufficient documentation

## 2018-09-06 DIAGNOSIS — Z87891 Personal history of nicotine dependence: Secondary | ICD-10-CM | POA: Insufficient documentation

## 2018-09-06 DIAGNOSIS — Z7901 Long term (current) use of anticoagulants: Secondary | ICD-10-CM | POA: Diagnosis not present

## 2018-09-06 DIAGNOSIS — Z79899 Other long term (current) drug therapy: Secondary | ICD-10-CM | POA: Insufficient documentation

## 2018-09-06 DIAGNOSIS — L910 Hypertrophic scar: Secondary | ICD-10-CM | POA: Diagnosis not present

## 2018-09-06 DIAGNOSIS — K219 Gastro-esophageal reflux disease without esophagitis: Secondary | ICD-10-CM | POA: Diagnosis not present

## 2018-09-06 DIAGNOSIS — I509 Heart failure, unspecified: Secondary | ICD-10-CM | POA: Diagnosis not present

## 2018-09-06 DIAGNOSIS — J449 Chronic obstructive pulmonary disease, unspecified: Secondary | ICD-10-CM | POA: Insufficient documentation

## 2018-09-06 DIAGNOSIS — S91302A Unspecified open wound, left foot, initial encounter: Secondary | ICD-10-CM | POA: Diagnosis not present

## 2018-09-06 DIAGNOSIS — F0390 Unspecified dementia without behavioral disturbance: Secondary | ICD-10-CM | POA: Insufficient documentation

## 2018-09-06 DIAGNOSIS — X58XXXA Exposure to other specified factors, initial encounter: Secondary | ICD-10-CM | POA: Diagnosis not present

## 2018-09-06 DIAGNOSIS — Z955 Presence of coronary angioplasty implant and graft: Secondary | ICD-10-CM | POA: Insufficient documentation

## 2018-09-06 DIAGNOSIS — I110 Hypertensive heart disease with heart failure: Secondary | ICD-10-CM | POA: Diagnosis not present

## 2018-09-06 DIAGNOSIS — I252 Old myocardial infarction: Secondary | ICD-10-CM | POA: Insufficient documentation

## 2018-09-06 DIAGNOSIS — I130 Hypertensive heart and chronic kidney disease with heart failure and stage 1 through stage 4 chronic kidney disease, or unspecified chronic kidney disease: Secondary | ICD-10-CM | POA: Diagnosis not present

## 2018-09-06 DIAGNOSIS — I2510 Atherosclerotic heart disease of native coronary artery without angina pectoris: Secondary | ICD-10-CM | POA: Insufficient documentation

## 2018-09-06 DIAGNOSIS — Z8673 Personal history of transient ischemic attack (TIA), and cerebral infarction without residual deficits: Secondary | ICD-10-CM | POA: Insufficient documentation

## 2018-09-06 DIAGNOSIS — I5032 Chronic diastolic (congestive) heart failure: Secondary | ICD-10-CM | POA: Diagnosis not present

## 2018-09-06 HISTORY — DX: Acute myocardial infarction, unspecified: I21.9

## 2018-09-06 HISTORY — PX: APPLICATION OF A-CELL OF EXTREMITY: SHX6303

## 2018-09-06 HISTORY — DX: Chronic obstructive pulmonary disease, unspecified: J44.9

## 2018-09-06 LAB — BASIC METABOLIC PANEL
Anion gap: 7 (ref 5–15)
BUN: 20 mg/dL (ref 8–23)
CO2: 30 mmol/L (ref 22–32)
Calcium: 8.7 mg/dL — ABNORMAL LOW (ref 8.9–10.3)
Chloride: 91 mmol/L — ABNORMAL LOW (ref 98–111)
Creatinine, Ser: 0.92 mg/dL (ref 0.44–1.00)
GFR calc Af Amer: >60 mL/min (ref >60)
GFR calc non Af Amer: 55 mL/min — ABNORMAL LOW (ref >60)
Glucose, Bld: 105 mg/dL — ABNORMAL HIGH (ref 70–99)
Potassium: 3.8 mmol/L (ref 3.5–5.1)
Sodium: 128 mmol/L — ABNORMAL LOW (ref 135–145)

## 2018-09-06 LAB — PROTIME-INR
INR: 2.2 — ABNORMAL HIGH (ref 0.8–1.2)
Prothrombin Time: 24 seconds — ABNORMAL HIGH (ref 11.4–15.2)

## 2018-09-06 LAB — CBC
HCT: 31.7 % — ABNORMAL LOW (ref 36.0–46.0)
Hemoglobin: 10.5 g/dL — ABNORMAL LOW (ref 12.0–15.0)
MCH: 26.4 pg (ref 26.0–34.0)
MCHC: 33.1 g/dL (ref 30.0–36.0)
MCV: 79.6 fL — ABNORMAL LOW (ref 80.0–100.0)
Platelets: 240 10*3/uL (ref 150–400)
RBC: 3.98 MIL/uL (ref 3.87–5.11)
RDW: 15.4 % (ref 11.5–15.5)
WBC: 6.6 10*3/uL (ref 4.0–10.5)
nRBC: 0 % (ref 0.0–0.2)

## 2018-09-06 SURGERY — APPLICATION OF A-CELL OF EXTREMITY
Anesthesia: General | Site: Foot | Laterality: Left

## 2018-09-06 MED ORDER — ACETAMINOPHEN 650 MG RE SUPP: 650.0000 mg | RECTAL | Status: DC | PRN

## 2018-09-06 MED ORDER — LACTATED RINGERS IV SOLN
INTRAVENOUS | Status: DC
  Administered 2018-09-06: 08:00:00 via INTRAVENOUS

## 2018-09-06 MED ORDER — OXYCODONE HCL 5 MG/5ML PO SOLN: 5.0000 mg | Freq: Once | ORAL | Status: DC | PRN

## 2018-09-06 MED ORDER — ACETAMINOPHEN 10 MG/ML IV SOLN: 1000.0000 mg | Freq: Once | INTRAVENOUS | Status: DC | PRN

## 2018-09-06 MED ORDER — SODIUM CHLORIDE 0.9% FLUSH: 3.0000 mL | INTRAVENOUS | Status: DC | PRN

## 2018-09-06 MED ORDER — OXYCODONE HCL 5 MG PO TABS: 5.0000 mg | ORAL_TABLET | Freq: Once | ORAL | Status: DC | PRN

## 2018-09-06 MED ORDER — ONDANSETRON HCL 4 MG/2ML IJ SOLN
INTRAMUSCULAR | Status: AC
  Filled 2018-09-06: qty 2

## 2018-09-06 MED ORDER — ACETAMINOPHEN 500 MG PO TABS: 1000.0000 mg | ORAL_TABLET | Freq: Once | ORAL | Status: DC | PRN

## 2018-09-06 MED ORDER — LACTATED RINGERS IV SOLN
INTRAVENOUS | Status: DC | PRN
  Administered 2018-09-06: 08:00:00 via INTRAVENOUS

## 2018-09-06 MED ORDER — CIPROFLOXACIN IN D5W 400 MG/200ML IV SOLN
400.0000 mg | INTRAVENOUS | Status: AC
  Administered 2018-09-06: 09:00:00 400 mg via INTRAVENOUS
  Filled 2018-09-06: qty 200

## 2018-09-06 MED ORDER — EPHEDRINE 5 MG/ML INJ
INTRAVENOUS | Status: AC
  Filled 2018-09-06: qty 10

## 2018-09-06 MED ORDER — BUPIVACAINE-EPINEPHRINE (PF) 0.25% -1:200000 IJ SOLN
INTRAMUSCULAR | Status: AC
  Filled 2018-09-06: qty 60

## 2018-09-06 MED ORDER — SODIUM CHLORIDE 0.9 % IV SOLN: 250.0000 mL | INTRAVENOUS | Status: DC | PRN

## 2018-09-06 MED ORDER — PHENYLEPHRINE 40 MCG/ML (10ML) SYRINGE FOR IV PUSH (FOR BLOOD PRESSURE SUPPORT)
PREFILLED_SYRINGE | INTRAVENOUS | Status: DC | PRN
  Administered 2018-09-06: 80 ug via INTRAVENOUS
  Administered 2018-09-06: 40 ug via INTRAVENOUS

## 2018-09-06 MED ORDER — 0.9 % SODIUM CHLORIDE (POUR BTL) OPTIME
TOPICAL | Status: DC | PRN
  Administered 2018-09-06: 09:00:00 1000 mL

## 2018-09-06 MED ORDER — LIDOCAINE 2% (20 MG/ML) 5 ML SYRINGE
INTRAMUSCULAR | Status: DC | PRN
  Administered 2018-09-06: 60 mg via INTRAVENOUS

## 2018-09-06 MED ORDER — PHENYLEPHRINE 40 MCG/ML (10ML) SYRINGE FOR IV PUSH (FOR BLOOD PRESSURE SUPPORT)
PREFILLED_SYRINGE | INTRAVENOUS | Status: AC
  Filled 2018-09-06: qty 10

## 2018-09-06 MED ORDER — FENTANYL CITRATE (PF) 250 MCG/5ML IJ SOLN
INTRAMUSCULAR | Status: AC
  Filled 2018-09-06: qty 5

## 2018-09-06 MED ORDER — DEXAMETHASONE SODIUM PHOSPHATE 10 MG/ML IJ SOLN
INTRAMUSCULAR | Status: DC | PRN
  Administered 2018-09-06: 5 mg via INTRAVENOUS

## 2018-09-06 MED ORDER — ACETAMINOPHEN 160 MG/5ML PO SOLN: 1000.0000 mg | Freq: Once | ORAL | Status: DC | PRN

## 2018-09-06 MED ORDER — PROPOFOL 10 MG/ML IV BOLUS
INTRAVENOUS | Status: DC | PRN
  Administered 2018-09-06: 120 mg via INTRAVENOUS

## 2018-09-06 MED ORDER — BUPIVACAINE-EPINEPHRINE (PF) 0.25% -1:200000 IJ SOLN
INTRAMUSCULAR | Status: DC | PRN
  Administered 2018-09-06: 4 mL

## 2018-09-06 MED ORDER — SODIUM CHLORIDE 0.9% FLUSH: 3.0000 mL | Freq: Two times a day (BID) | INTRAVENOUS | Status: DC

## 2018-09-06 MED ORDER — ACETAMINOPHEN 325 MG PO TABS: 650.0000 mg | ORAL_TABLET | ORAL | Status: DC | PRN

## 2018-09-06 MED ORDER — FENTANYL CITRATE (PF) 100 MCG/2ML IJ SOLN: 25.0000 ug | INTRAMUSCULAR | Status: DC | PRN

## 2018-09-06 MED ORDER — EPHEDRINE SULFATE-NACL 50-0.9 MG/10ML-% IV SOSY
PREFILLED_SYRINGE | INTRAVENOUS | Status: DC | PRN
  Administered 2018-09-06 (×2): 10 mg via INTRAVENOUS
  Administered 2018-09-06: 5 mg via INTRAVENOUS

## 2018-09-06 MED ORDER — ONDANSETRON HCL 4 MG/2ML IJ SOLN
INTRAMUSCULAR | Status: DC | PRN
  Administered 2018-09-06: 4 mg via INTRAVENOUS

## 2018-09-06 MED ORDER — FENTANYL CITRATE (PF) 250 MCG/5ML IJ SOLN
INTRAMUSCULAR | Status: DC | PRN
  Administered 2018-09-06: 25 ug via INTRAVENOUS

## 2018-09-06 SURGICAL SUPPLY — 37 items
BANDAGE ELASTIC 4 VELCRO ST LF (GAUZE/BANDAGES/DRESSINGS) ×6 IMPLANT
BNDG GAUZE ELAST 4 BULKY (GAUZE/BANDAGES/DRESSINGS) ×9 IMPLANT
CANISTER WOUND CARE 500ML ATS (WOUND CARE) IMPLANT
COVER WAND RF STERILE (DRAPES) ×3 IMPLANT
DRAPE INCISE IOBAN 66X45 STRL (DRAPES) IMPLANT
DRAPE ORTHO SPLIT 77X108 STRL (DRAPES) ×2
DRAPE SURG ORHT 6 SPLT 77X108 (DRAPES) ×1 IMPLANT
DRSG ADAPTIC 3X8 NADH LF (GAUZE/BANDAGES/DRESSINGS) IMPLANT
DRSG CUTIMED SORBACT 7X9 (GAUZE/BANDAGES/DRESSINGS) ×3 IMPLANT
DRSG VAC ATS LRG SENSATRAC (GAUZE/BANDAGES/DRESSINGS) IMPLANT
DRSG VAC ATS MED SENSATRAC (GAUZE/BANDAGES/DRESSINGS) IMPLANT
DRSG VAC ATS SM SENSATRAC (GAUZE/BANDAGES/DRESSINGS) IMPLANT
ELECT NEEDLE BLADE 2-5/6 (NEEDLE) ×3 IMPLANT
ELECT REM PT RETURN 9FT ADLT (ELECTROSURGICAL) ×3
ELECTRODE REM PT RTRN 9FT ADLT (ELECTROSURGICAL) ×1 IMPLANT
GAUZE SPONGE 4X4 12PLY STRL (GAUZE/BANDAGES/DRESSINGS) ×3 IMPLANT
GEL ULTRASOUND 20GR AQUASONIC (MISCELLANEOUS) IMPLANT
GLOVE BIO SURGEON STRL SZ 6.5 (GLOVE) ×6 IMPLANT
GLOVE BIO SURGEONS STRL SZ 6.5 (GLOVE) ×3
GOWN STRL REUS W/ TWL LRG LVL3 (GOWN DISPOSABLE) ×3 IMPLANT
GOWN STRL REUS W/TWL LRG LVL3 (GOWN DISPOSABLE) ×6
KIT BASIN OR (CUSTOM PROCEDURE TRAY) ×3 IMPLANT
MARKER SKIN DUAL TIP RULER LAB (MISCELLANEOUS) ×3 IMPLANT
MATRIX WOUND 3-LAYER 5X5 (Tissue) ×2 IMPLANT
MICROMATRIX 500MG (Tissue) ×3 IMPLANT
NEEDLE HYPO 25GX1X1/2 BEV (NEEDLE) ×3 IMPLANT
PACK GENERAL/GYN (CUSTOM PROCEDURE TRAY) ×3 IMPLANT
SOLUTION PARTIC MCRMTRX 500MG (Tissue) ×1 IMPLANT
STAPLER VISISTAT 35W (STAPLE) IMPLANT
STOCKINETTE IMPERVIOUS 9X36 MD (GAUZE/BANDAGES/DRESSINGS) IMPLANT
STOCKINETTE IMPERVIOUS LG (DRAPES) ×3 IMPLANT
SUT SILK 4 0 P 3 (SUTURE) ×3 IMPLANT
SUT VIC AB 5-0 PS2 18 (SUTURE) ×3 IMPLANT
SWAB CULTURE LIQ STUART DBL (MISCELLANEOUS) ×3 IMPLANT
SWAB CULTURE LIQUID MINI MALE (MISCELLANEOUS) ×3 IMPLANT
SYR CONTROL 10ML LL (SYRINGE) ×3 IMPLANT
WOUND MATRIX 3-LAYER 5X5 (Tissue) ×1 IMPLANT

## 2018-09-06 NOTE — Interval H&P Note (Signed)
History and Physical Interval Note:  09/06/2018 8:36 AM  Brittany [name]  has presented today for surgery, with the diagnosis of Open wound of left foot.  The various methods of treatment have been discussed with the patient and family. After consideration of risks, benefits and other options for treatment, the patient has consented to  Procedure(s): DEBRIDMENT LEFT FOOT WITH ACELL PLACEMENT (Left) as a surgical intervention.  The patient's history has been reviewed, patient examined, no change in status, stable for surgery.  I have reviewed the patient's chart and labs.  Questions were answered to the patient's satisfaction.     Loel Lofty Ryli Standlee

## 2018-09-06 NOTE — Progress Notes (Signed)
Patient was not given instructions to stop Warfarin.  Daughter stated that last dose was yesterday.  Dr. Marla Roe and Dr. Ermalene Postin aware.

## 2018-09-06 NOTE — Discharge Instructions (Addendum)
Guide to Wound Care  Proper wound care may reduce the risk of infection, improve healing rates, and limit scarring.  This is a general guide to help care for and manage wounds treated with MicroMatrix or Cytal Wound Matrix.   Dressing Changes The frequency of dressing changes can vary based on which product was applied, the size of the wound, or the amount of wound drainage. Dressing inspections are recommended, at least weekly.   Place KY gel on the wound daily and cover with gauze. To start Saturday morning.  Dressing Types Primary Dressing:  Non-adherent dressing goes directly over wounds being treated with the powder or sheet (MicroMatrix and/or Cytal).  Secondary Dressing:  Secures the primary dressing in place and provides extra protection, compression, and absorption.  1. Wash Hands - To help decrease the risk of infection, caregivers should wash their hands for a minimum of 20 seconds and may use medical gloves.   2. Remove the Dressings - Avoid removing product from the wound by carefully removing the applicable dressing(s) at the time points recommended above, or as recommended by the treating physician.  Expected Color and Odor:  It is entirely normal for the wound to have an unpleasant odor and to form a caramel-colored gel as the product absorbs into the wound. It is  important to leave this gel on the wound site.  3. Clean the Wound - Use clean water or saline to gently rinse the wound surface and remove any excess discharge that may be present on the wound. Do not wipe off any of the caramel-colored gel on the wound. Protocol for cleaning the wound may vary by facility. If you are unsure what to do, ask the treating physician.  4. Inspect the Wound - Proper wound care requires accurate and clinically relevant wound assessment. Protocols for inspecting the wound may vary by facility, but it is important to  [id number] the length and width.  What to look out for:  Large or  increased amount of drainage   Surrounding skin is red or hot to touch   Increased pain in or around the wound   Flu-like symptoms, fatigue, decreased appetite, fever   Hard, crusty wound surface with black or brown coloring  5. Apply New Dressings - Dressings should cover the entire wound and be suitable for maintaining a moist wound environment. Refer to the back of this guide for more information.  Maintain a Hydrated Wound Area While hydration protocols may vary by facility, it is important to keep the wound area moist throughout the healing process. If the wound appears to be dry during dressing changes, select a dressing that will hydrate the wound and maintain that ideal moist environment. If you are unsure what to do, ask the treating physician.  Remodeling Process Every patient heals differently, and no two cases are the same. The size and location of the wound, product type and layering configurations, and general patient health all contribute to how quickly a wound will heal.  While many factors can influence the rate at which the product absorbs, the following can be used as a general guide.   THINGS TO DO: Refrain from smoking High protein diet and limit carbohydrates and suger Protect the wound from trauma Protect the dressing  Micromatrix powder       Cytal Sheet            Sorbact dressing

## 2018-09-06 NOTE — Anesthesia Procedure Notes (Signed)
Procedure Name: LMA Insertion Date/Time: 09/06/2018 9:21 AM Performed by: Harden Mo, CRNA Pre-anesthesia Checklist: Patient identified, Emergency Drugs available, Suction available and Patient being monitored Patient Re-evaluated:Patient Re-evaluated prior to induction Oxygen Delivery Method: Circle System Utilized Preoxygenation: Pre-oxygenation with 100% oxygen Induction Type: IV induction LMA: LMA inserted LMA Size: 4.0 Number of attempts: 1 Airway Equipment and Method: Bite block Placement Confirmation: positive ETCO2 Tube secured with: Tape Dental Injury: Teeth and Oropharynx as per pre-operative assessment

## 2018-09-06 NOTE — Anesthesia Postprocedure Evaluation (Signed)
Anesthesia Post Note  Patient: [name]  Procedure(s) Performed: DEBRIDMENT LEFT FOOT WITH ACELL PLACEMENT (Left Foot)     Patient location during evaluation: PACU Anesthesia Type: General Level of consciousness: awake and alert Pain management: pain level controlled Vital Signs Assessment: post-procedure vital signs reviewed and stable Respiratory status: spontaneous breathing, nonlabored ventilation, respiratory function stable and patient connected to nasal cannula oxygen Cardiovascular status: blood pressure returned to baseline and stable Postop Assessment: no apparent nausea or vomiting Anesthetic complications: no    Last Vitals:  Vitals:   09/06/18 1014 09/06/18 1038  BP: (!) 115/52 114/65  Pulse: 60 63  Resp: 12 14  Temp:  (!) 36.3 C  SpO2: 92% 97%    Last Pain:  Vitals:   09/06/18 1030  TempSrc:   PainSc: 0-No pain                 Bethsaida Siegenthaler

## 2018-09-06 NOTE — Transfer of Care (Signed)
Immediate Anesthesia Transfer of Care Note  Patient: Brittany [name]  Procedure(s) Performed: DEBRIDMENT LEFT FOOT WITH ACELL PLACEMENT (Left Foot)  Patient Location: PACU  Anesthesia Type:General  Level of Consciousness: awake and alert   Airway & Oxygen Therapy: Patient Spontanous Breathing  Post-op Assessment: Report given to RN, Post -op Vital signs reviewed and stable and Patient moving all extremities X 4  Post vital signs: Reviewed and stable  Last Vitals:  Vitals Value Taken Time  BP 121/54 09/06/2018  9:59 AM  Temp    Pulse 56 09/06/2018 10:00 AM  Resp 13 09/06/2018 10:00 AM  SpO2 96 % 09/06/2018 10:00 AM  Vitals shown include unvalidated device data.  Last Pain:  Vitals:   09/06/18 0810  TempSrc:   PainSc: 0-No pain      Patients Stated Pain Goal: 3 (03/49/17 9150)  Complications: No apparent anesthesia complications

## 2018-09-06 NOTE — Op Note (Signed)
DATE OF OPERATION: 09/06/2018  LOCATION: [facility]  PREOPERATIVE DIAGNOSIS: left foot wound  POSTOPERATIVE DIAGNOSIS: Same  PROCEDURE: Excision of left foot wound 3 x 3 cm skin and soft tissue.  Placement of Acell 5 x 5 cm sheet and 500 mg powder.  SURGEON: Goldy Calandra Sanger Quanta Robertshaw, DO  ASSISTANT: Bonita Cox, RNFA  EBL: minimal  CONDITION: Stable  COMPLICATIONS: None  INDICATION: The patient, Brittany [name], is a 85 y.o. female born on [birth year], is here for treatment of a left foot wound.   PROCEDURE DETAILS:  The patient was seen prior to surgery and marked.  The IV antibiotics were given. The patient was taken to the operating room and given a general anesthetic. A standard time out was performed and all information was confirmed by those in the room. SCD was placed on the right leg.   The leg was prepped and draped with betadine.  Local with epinepherine was injected at the periwound area for intraoperative hemostasis and postoperative pain control.  The foot was irrigated with saline.  The needle tip bovie was used to excise the wound full thickness.  This was 3 x 3 cm and included skin and soft tissue.  A short silk suture was placed at the 12 o'clock and long one at the 3 o'clock position.  The deep wound was cultured as well.  All of the acell powder was applied to the wound base.  The acell sheet was applied and 3 x 3 cm was used.  The sheet was secured with the 5-0 Vicryl.  The Sorbact was placed and secured with the 5-0 Vicryl.  KY gel and gauze was applied.  The leg was wrapped with kerlex and ace wrap.  The patient was allowed to wake up and taken to recovery room in stable condition at the end of the case. The family was notified at the end of the case.   The RNFA assisted throughout the case.  The RNFA was essential in retraction and counter traction when needed to make the case progress smoothly.  This retraction and assistance made it possible to see  the tissue plans for the procedure.  The assistance was needed for blood control, tissue re-approximation and assisted with closure of the incision site.

## 2018-09-07 ENCOUNTER — Encounter (HOSPITAL_COMMUNITY): Payer: Self-pay | Admitting: Plastic Surgery

## 2018-09-10 ENCOUNTER — Telehealth: Payer: Self-pay | Admitting: Cardiovascular Disease

## 2018-09-10 DIAGNOSIS — F0150 Vascular dementia without behavioral disturbance: Secondary | ICD-10-CM | POA: Diagnosis not present

## 2018-09-10 DIAGNOSIS — I482 Chronic atrial fibrillation, unspecified: Secondary | ICD-10-CM | POA: Diagnosis not present

## 2018-09-10 DIAGNOSIS — I130 Hypertensive heart and chronic kidney disease with heart failure and stage 1 through stage 4 chronic kidney disease, or unspecified chronic kidney disease: Secondary | ICD-10-CM | POA: Diagnosis not present

## 2018-09-10 DIAGNOSIS — I25119 Atherosclerotic heart disease of native coronary artery with unspecified angina pectoris: Secondary | ICD-10-CM | POA: Diagnosis not present

## 2018-09-10 DIAGNOSIS — I69318 Other symptoms and signs involving cognitive functions following cerebral infarction: Secondary | ICD-10-CM | POA: Diagnosis not present

## 2018-09-10 DIAGNOSIS — Z48817 Encounter for surgical aftercare following surgery on the skin and subcutaneous tissue: Secondary | ICD-10-CM | POA: Diagnosis not present

## 2018-09-10 NOTE — Telephone Encounter (Signed)
New message:    Patient nurse calling concering if she should stop the patient  INR patient was in the hospital.

## 2018-09-11 ENCOUNTER — Ambulatory Visit (INDEPENDENT_AMBULATORY_CARE_PROVIDER_SITE_OTHER): Payer: Medicare Other

## 2018-09-11 DIAGNOSIS — Z48817 Encounter for surgical aftercare following surgery on the skin and subcutaneous tissue: Secondary | ICD-10-CM | POA: Diagnosis not present

## 2018-09-11 DIAGNOSIS — I25119 Atherosclerotic heart disease of native coronary artery with unspecified angina pectoris: Secondary | ICD-10-CM | POA: Diagnosis not present

## 2018-09-11 DIAGNOSIS — I69318 Other symptoms and signs involving cognitive functions following cerebral infarction: Secondary | ICD-10-CM | POA: Diagnosis not present

## 2018-09-11 DIAGNOSIS — F0150 Vascular dementia without behavioral disturbance: Secondary | ICD-10-CM | POA: Diagnosis not present

## 2018-09-11 DIAGNOSIS — I130 Hypertensive heart and chronic kidney disease with heart failure and stage 1 through stage 4 chronic kidney disease, or unspecified chronic kidney disease: Secondary | ICD-10-CM | POA: Diagnosis not present

## 2018-09-11 DIAGNOSIS — I482 Chronic atrial fibrillation, unspecified: Secondary | ICD-10-CM

## 2018-09-11 DIAGNOSIS — Z5181 Encounter for therapeutic drug level monitoring: Secondary | ICD-10-CM

## 2018-09-11 LAB — AEROBIC/ANAEROBIC CULTURE W GRAM STAIN (SURGICAL/DEEP WOUND): Culture: NO GROWTH

## 2018-09-11 LAB — POCT INR: INR: 2.5 (ref 2.0–3.0)

## 2018-09-11 NOTE — Patient Instructions (Signed)
Description   Spoke with Mo-LPN [facility] while in pt's home and instructed to have pt continue on same dosage 1 tablet daily.  Recheck in 2 weeks. Call if any changes [phone].

## 2018-09-11 NOTE — Telephone Encounter (Signed)
Followed up w/ [facility] (this call was routed to Dr. Curt Bears) She reports that she was calling in to speak with Dr. Elmarie Shiley nurse about pt medications & BP. Aware nurse will follow up tomorrow when she is back in the office.

## 2018-09-12 NOTE — Telephone Encounter (Signed)
Spoke with Mo, [facility] regarding patient's medications and hypotension Upon review of medication list it was discovered that Mo has a different medication list than what we have. She asks that I call patient's daughter who is in the home with the patient and prepares her medications daily. Mo states she will call back later today for correct medication list.  I called patient's daughter and she states she is sick today and cannot reconcile meds. I advised that I will call back tomorrow.

## 2018-09-13 NOTE — Telephone Encounter (Signed)
Left message requesting a call back to reconcile patient's medications

## 2018-09-17 ENCOUNTER — Telehealth: Payer: Self-pay | Admitting: Plastic Surgery

## 2018-09-17 ENCOUNTER — Telehealth: Payer: Self-pay | Admitting: Cardiovascular Disease

## 2018-09-17 MED ORDER — TORSEMIDE 20 MG PO TABS: 10.0000 mg | ORAL_TABLET | Freq: Every day | ORAL | 3 refills | Status: DC | PRN

## 2018-09-17 NOTE — Telephone Encounter (Signed)

## 2018-09-17 NOTE — Telephone Encounter (Signed)
Spoke with patient who states she is currently at her son's house in [place] but her daughter, [name], is the one who prepares her medications and to call her with questions. Patient states she and her family are currently looking for a long-term care facility for her to live in. I thanked her for her help. I spoke with [name] and verified the patient's medication list. I called Mo, [facility] to give her the information and she advised she will call me back later.

## 2018-09-17 NOTE — Addendum Note (Signed)
Addended by: Emmaline Life on: 09/17/2018 03:21 PM   Modules accepted: Orders

## 2018-09-17 NOTE — Telephone Encounter (Signed)
New Message            [facility] nurse is returning Brittany [name]'s call, she would like a return call.

## 2018-09-18 ENCOUNTER — Other Ambulatory Visit: Payer: Self-pay

## 2018-09-18 ENCOUNTER — Telehealth: Payer: Self-pay | Admitting: Cardiovascular Disease

## 2018-09-18 ENCOUNTER — Ambulatory Visit (INDEPENDENT_AMBULATORY_CARE_PROVIDER_SITE_OTHER): Payer: Medicare Other | Admitting: Nurse Practitioner

## 2018-09-18 ENCOUNTER — Encounter: Payer: Self-pay | Admitting: Plastic Surgery

## 2018-09-18 VITALS — BP 103/59 | HR 54 | Temp 98.1°F | Ht 61.0 in

## 2018-09-18 DIAGNOSIS — S91302A Unspecified open wound, left foot, initial encounter: Secondary | ICD-10-CM

## 2018-09-18 DIAGNOSIS — R350 Frequency of micturition: Secondary | ICD-10-CM | POA: Diagnosis not present

## 2018-09-18 NOTE — Telephone Encounter (Signed)
Reviewed patient's medication list with Mo, [facility] with [facility]. I advised her of the information given to me by the patient that her daughter [name] prepares her medications for her but that she is currently staying at her son's house. Mo states she will follow-up with the family and may call back regarding patient's low BP readings. I advised her that Dr. Acie Fredrickson would likely decrease Lisinopril to 10 mg before changing other medications. She verbalized understanding and thanked me for the call.

## 2018-09-18 NOTE — Telephone Encounter (Signed)
New Message    Mo is returning the call to [facility]   Please call back

## 2018-09-18 NOTE — Telephone Encounter (Signed)
Spoke with Mo who states she will call back after a meeting that she is attending

## 2018-09-18 NOTE — Progress Notes (Signed)
Patient ID: Brittany [name], female    DOB: Jun 04, 1927, 85 y.o.   MRN: [id number]   Chief Complaint  Patient presents with  . Post-op Follow-up    on (L) foot wound   Brittany [name] is an 85 yo female POD #12 s/p debridement of left foot wound with Acell placement. She presents with her son for wound care f/u. She is doing well today. She has been putting KY gel on the wound and covering with gauze and ACE wrap. Her daughter-in-law has been changing the dressing in between home health visits. Patient is unable to remember which home health agency she uses. Patient states she will be moving into a skilled nursing facility soon. Patient is also concerned about her right great toenail.   Review of Systems  Constitutional: Negative.   HENT: Negative.   Respiratory: Negative.   Cardiovascular: Negative.   Gastrointestinal: Negative.   Genitourinary: Negative.   Musculoskeletal: Negative.   Skin:       Left foot wound  Hematological: Negative.     Past Medical History:  Diagnosis Date  . Abnormal liver diagnostic imaging    suspected cirrhosis  . Arthritis   . Atrial fibrillation ([place])   . Chronic back pain   . COPD (chronic obstructive pulmonary disease) ([place])   . Diverticulosis   . Dyslipidemia    takes Lipitor daily  . Dysrhythmia    Atrial Fibrillation  . Foot drop    SINCE 1987  . GERD (gastroesophageal reflux disease)   . H/O hiatal hernia   . Hearing impaired    Bilateral hearing aids  . History of bacterial endocarditis   . History of gastritis   . History of stomach ulcers    many yrs ago  . History of TIA (transient ischemic attack)    2013-- RESIDUAL PERIPHERAL VISION RIGHT EYE--  RESOLVED  . Hypertension   . IC (interstitial cystitis)   . Macular degeneration   . Myocardial infarction ([place])   . Pulmonary hypertension ([place]) 10/2013   Based on TTE  . S/P aortic valve replacement    2005  . Stroke [facility])    TIA  . Urine incontinence     Past Surgical  History:  Procedure Laterality Date  . ABDOMINAL HYSTERECTOMY  1967  . AORTIC VALVE REPLACEMENT  09-29-2003    [facility] pericardial tissue valve  . APPENDECTOMY  1933  . APPLICATION OF A-CELL OF EXTREMITY Left 09/06/2018   Procedure: DEBRIDMENT LEFT FOOT WITH ACELL PLACEMENT;  Surgeon: Wallace Going, DO;  Location: [place];  Service: Plastics;  Laterality: Left;  . BIOPSY  02/01/2018   Procedure: BIOPSY;  Surgeon: Rush Landmark Telford Nab., MD;  Location: [place];  Service: Gastroenterology;;  . CARDIAC CATHETERIZATION  07-15-2003 DR HELEN PRESTON   MODERATE TO MODERATELY SEVERE CALCIFIC AORTIC STENOSIS/  NORMAL CORONARY ARTERIES  . CARDIOVASCULAR STRESS TEST  01-30-2012  DR NASHER   NORMAL NUCLEAR STUDY/  EF 73%/  NORMAL LVF  . CARPAL TUNNEL RELEASE Bilateral   . CATARACT EXTRACTION W/ INTRAOCULAR LENS  IMPLANT, BILATERAL    . CHOLECYSTECTOMY  1993  . CYSTO WITH HYDRODISTENSION N/A 02/05/2013   Procedure: CYSTOSCOPY/HYDRODISTENSION;  Surgeon: Irine Seal, MD;  Location: [facility];  Service: Urology;  Laterality: N/A;  . CYSTO/ HYDRODISTENTION/ INSTILLATION CLORPACTIN  MULTIPLE  last one 2009  . DILATION AND CURETTAGE OF UTERUS    . ERCP N/A 08/10/2012   Procedure: ENDOSCOPIC RETROGRADE CHOLANGIOPANCREATOGRAPHY (ERCP);  Surgeon: Inda Castle, MD;  Location: [place];  Service: Gastroenterology;  Laterality: N/A;  . ESOPHAGOGASTRODUODENOSCOPY (EGD) WITH PROPOFOL N/A 08/18/2014   Procedure: ESOPHAGOGASTRODUODENOSCOPY (EGD) WITH PROPOFOL;  Surgeon: Inda Castle, MD;  Location: [facility];  Service: Endoscopy;  Laterality: N/A;  . ESOPHAGOGASTRODUODENOSCOPY (EGD) WITH PROPOFOL N/A 02/01/2018   Procedure: ESOPHAGOGASTRODUODENOSCOPY (EGD) WITH PROPOFOL;  Surgeon: Rush Landmark Telford Nab., MD;  Location: [place];  Service: Gastroenterology;  Laterality: N/A;  . FOREIGN BODY REMOVAL  02/01/2018   Procedure: FOREIGN BODY REMOVAL;  Surgeon: Rush Landmark  Telford Nab., MD;  Location: [place];  Service: Gastroenterology;;  . LEFT HEART CATHETERIZATION WITH CORONARY ANGIOGRAM N/A 10/18/2013   Procedure: LEFT HEART CATHETERIZATION WITH CORONARY ANGIOGRAM;  Surgeon: Jettie Booze, MD;  Location: [facility] CATH LAB;  Service: Cardiovascular;  Laterality: N/A;  . LUMBAR [place] &  2007  . MACROPLASTIQUE URETHRAL IMPLANTATION  08-27-2009  . MINI-OPEN RIGHT ROTATOR CUFF REPAIR  07-12-2001  . RIGHT SHOULDER ARTHROSCOPY W/ DEBRIDEMENT ROTATOR CUFF AND LABRAL TEAR/ ACROMINOPLASTY/ DISTAL CLAVICLE EXCISION/ CA LIGAMENT RELEASE  10-08-1999  . TEE WITHOUT CARDIOVERSION N/A 06/20/2018   Procedure: TRANSESOPHAGEAL ECHOCARDIOGRAM (TEE);  Surgeon: Jerline Pain, MD;  Location: [facility] ENDOSCOPY;  Service: Cardiovascular;  Laterality: N/A;  . TONSILLECTOMY AND ADENOIDECTOMY    . TRANSTHORACIC ECHOCARDIOGRAM  08-10-2011   MILD LVH/  EF 55-60%/  NORMAL AVR TISSUE/ MILD MR/  MODERATE DILATED LA/  MILD DILATED RA  . TRANSVAGINAL TAPE PROCEDURE  07-30-2002  . VEIN LIGATION  1969   RIGHT LOWER LEG      Current Outpatient Medications:  .  acetaminophen (TYLENOL) 325 MG tablet, Take 650 mg by mouth daily as needed for mild pain., Disp: , Rfl:  .  atorvastatin (LIPITOR) 20 MG tablet, Take 20 mg by mouth daily., Disp: , Rfl:  .  busPIRone (BUSPAR) 15 MG tablet, Take 1 tablet (15 mg total) by mouth 2 (two) times daily., Disp: 180 tablet, Rfl: 2 .  cholecalciferol (VITAMIN D3) 25 MCG (1000 UT) tablet, Take 1,000 Units by mouth 2 (two) times a day., Disp: , Rfl:  .  cycloSPORINE (RESTASIS) 0.05 % ophthalmic emulsion, Place 1 drop into both eyes 2 (two) times daily., Disp: , Rfl:  .  diazepam (VALIUM) 2 MG tablet, Take 1 tablet (2 mg total) by mouth at bedtime as needed for anxiety., Disp: 10 tablet, Rfl: 0 .  diltiazem (CARDIZEM) 120 MG tablet, Take 120 mg by mouth 2 (two) times daily., Disp: , Rfl:  .  HYDROcodone-acetaminophen (NORCO/VICODIN) 5-325 MG tablet,  Take 1 tablet by mouth every 6 (six) hours as needed for severe pain., Disp: 14 tablet, Rfl: 0 .  lisinopril (ZESTRIL) 20 MG tablet, Take 1 tablet by mouth daily., Disp: , Rfl:  .  Multiple Vitamins-Minerals (ICAPS AREDS 2) CAPS, Take 1 capsule by mouth 2 (two) times daily., Disp: , Rfl:  .  nitroGLYCERIN (NITROSTAT) 0.4 MG SL tablet, Place 1 tablet (0.4 mg total) under the tongue every 5 (five) minutes as needed for chest pain., Disp: 30 tablet, Rfl: 0 .  ondansetron (ZOFRAN) 8 MG tablet, Take 1 tablet (8 mg total) by mouth 2 (two) times daily. (Patient taking differently: Take 8 mg by mouth every 8 (eight) hours as needed for nausea or vomiting. ), Disp: 90 tablet, Rfl: 1 .  oxybutynin (DITROPAN) 5 MG tablet, Take 5-10 mg by mouth See admin instructions. 5 mg in the morning, 10 mg at bedtime, Disp: , Rfl:  .  pantoprazole (PROTONIX) 40 MG tablet, Take 40 mg by mouth 2 (two) times daily., Disp: , Rfl:  .  Polyvinyl Alcohol-Povidone (REFRESH OP), Place 1 drop into both eyes 2 (two) times daily., Disp: , Rfl:  .  torsemide (DEMADEX) 20 MG tablet, Take 0.5 tablets (10 mg total) by mouth daily as needed (weight gain)., Disp: 90 tablet, Rfl: 3 .  vitamin C (ASCORBIC ACID) 500 MG tablet, Take 500 mg by mouth 2 (two) times daily., Disp: , Rfl:  .  warfarin (COUMADIN) 5 MG tablet, Take as directed by [facility] (Patient taking differently: Take 2.5-5 mg by mouth daily at 6 PM. Take 5mg  by mouth daily at 6pm. Take 2.5mg  by mouth on T/Th.), Disp: 30 tablet, Rfl: 2 .  zinc sulfate 220 (50 Zn) MG capsule, Take 1 capsule (220 mg total) by mouth daily., Disp: 30 capsule, Rfl: 0   Objective:   Vitals:   09/18/18 1304 09/18/18 1453  BP: (!) 92/54 (!) 103/59  Pulse: (!) 44 (!) 54  Temp: 98.1 F (36.7 C)   SpO2: 98%     Physical Exam  General: alert, calm, wheelchair, no acute distress Neck: supple, full ROM Chest: symmetrical rise and fall Lungs: unlabored breathing Musculoskeletal: MAEx4 Neuro:  A&O x3, calm, cooperative Skin: left foot wound with granulation; no bleeding, edema, or surrounding erythema   Assessment & Plan:  Brittany [name] is an 85 yo female POD #12 s/p debridement of left foot wound with Acell placement. She is healing well. The wound was covered with Adaptic, KY gel, gauze, and ACE wrap. Patient's son will call with home health agency information to place new orders for dressing changes. She can shower in 1 week. Follow up in office 2 weeks.     Alfredo Batty, NP

## 2018-09-19 ENCOUNTER — Telehealth: Payer: Self-pay | Admitting: Plastic Surgery

## 2018-09-19 DIAGNOSIS — Z111 Encounter for screening for respiratory tuberculosis: Secondary | ICD-10-CM | POA: Diagnosis not present

## 2018-09-19 NOTE — Telephone Encounter (Signed)
Received call from [facility]. Doroteo Bradford stated that she spoke with the patient last night as the patient was supposed to receive home health services today. Patient has moved in with her son, who lives in [place], and cannot be serviced by [employer] as that is out of their range. Patient's cell number is [phone]. Maisie Fus number is [phone].

## 2018-09-20 DIAGNOSIS — T50901A Poisoning by unspecified drugs, medicaments and biological substances, accidental (unintentional), initial encounter: Secondary | ICD-10-CM | POA: Diagnosis not present

## 2018-09-20 DIAGNOSIS — T424X1A Poisoning by benzodiazepines, accidental (unintentional), initial encounter: Secondary | ICD-10-CM | POA: Diagnosis not present

## 2018-09-20 DIAGNOSIS — T402X1A Poisoning by other opioids, accidental (unintentional), initial encounter: Secondary | ICD-10-CM | POA: Diagnosis not present

## 2018-09-20 DIAGNOSIS — I2510 Atherosclerotic heart disease of native coronary artery without angina pectoris: Secondary | ICD-10-CM | POA: Diagnosis not present

## 2018-09-20 DIAGNOSIS — F0390 Unspecified dementia without behavioral disturbance: Secondary | ICD-10-CM | POA: Diagnosis not present

## 2018-09-20 DIAGNOSIS — I509 Heart failure, unspecified: Secondary | ICD-10-CM | POA: Diagnosis not present

## 2018-09-20 DIAGNOSIS — Z79899 Other long term (current) drug therapy: Secondary | ICD-10-CM | POA: Diagnosis not present

## 2018-09-20 DIAGNOSIS — M79672 Pain in left foot: Secondary | ICD-10-CM | POA: Diagnosis not present

## 2018-09-20 DIAGNOSIS — K219 Gastro-esophageal reflux disease without esophagitis: Secondary | ICD-10-CM | POA: Diagnosis not present

## 2018-09-20 NOTE — Telephone Encounter (Signed)
Called and spoke with Doroteo Bradford with [facility] (09/19/18) and she stated that she just wanted to let us know that she did see the patient on yesterday for wound care, but this will be her last visit with the patient because the patient has moved to [place] and she was told that they will not be able to continue to service her in [place].  Doroteo Bradford informed me that they do have a [facility] office in [place] and they do service patient's in [place].  She asked me if I knew how to dismiss the patient from there service and add them to the [facility] in [place].  I informed her that I did not know how to dismiss the patient.  She stated that she will let the office know that I will be calling them to refer the patient to the [facility] in [place].  Asked Doroteo Bradford what services does she render to the patient, and she stated that she does the wound care and education.//AB/CMA

## 2018-09-21 ENCOUNTER — Telehealth: Payer: Self-pay | Admitting: Plastic Surgery

## 2018-09-21 MED ORDER — CIPROFLOXACIN HCL 500 MG PO TABS: 500.0000 mg | ORAL_TABLET | Freq: Two times a day (BID) | ORAL | 0 refills | Status: AC

## 2018-09-21 MED ORDER — CIPROFLOXACIN HCL 500 MG PO TABS: 500.0000 mg | ORAL_TABLET | Freq: Two times a day (BID) | ORAL | 0 refills | Status: DC

## 2018-09-21 NOTE — Addendum Note (Signed)
Addended by: Wallace Going on: 09/21/2018 12:36 PM   Modules accepted: Orders

## 2018-09-21 NOTE — Telephone Encounter (Signed)
Received call from patient's son with concerns regarding his mothers left foot wound. He states that it is red and painful to touch. However, she does not have a fever. Contact: [phone]

## 2018-09-24 ENCOUNTER — Telehealth: Payer: Self-pay | Admitting: *Deleted

## 2018-09-24 ENCOUNTER — Ambulatory Visit: Payer: Medicare Other | Admitting: Podiatry

## 2018-09-24 NOTE — Telephone Encounter (Addendum)
Called [facility] in [place] on (09/20/18) regarding the referral.  Was told to fax orders,demographics, copy of insurance card,and last office visit note.  Asked the nurse when will a nurse be able to go out and see the patient for a wound change, and she stated when they get the referral information they will process it and they will assign a nurse and it sure not be any later than Saturday when they can go see her.   Faxed information and confirmation received.//AB/CMA

## 2018-09-24 NOTE — Telephone Encounter (Signed)
Called and spoke with the patient on (09/20/18) to let her know that I spoke with Doroteo Bradford with [facility] on yesterday regarding [place] having to refer her to the [facility] office in [place].  Patient stated that she was made aware that Doroteo Bradford could no longer see her.  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  She's now with her son in [place], but she will be going into a Assisting Living facility in [place] in about 2 weeks around the (6/26 or 27).  Asked the patient when did she have her last wound care visit, and she stated her wound was changed on yesterday and everything was good.   Asked her how she was doing and she stated she was having some pain in her foot.  Asked if she had taking anything for the pain and she stated that she had taken Hydrocodone this morning.  Asked if she wanted me to speak with Dr. Marla Roe regarding the pain she was having in her foot and she said no I will see how I do.  Informed the patient if she needs [place] to please give [place] a call back.  Patient verbalized understanding and agreed.//AB/CMA

## 2018-09-24 NOTE — Telephone Encounter (Signed)
I spoke with [facility] in [place] on (09/21/18) regarding the faxed orders for the patient.  I was informed by them that the orders will need to be faxed to the office in [place].  Orders was faxed and I called and spoke with Amanda,RN at the [facility] site and she informed me that they are going to be able to continue the patient's care where she's is with her son in [place].  Confirmation received.//AB/CMA

## 2018-09-24 NOTE — Telephone Encounter (Addendum)
Spoke with Dr. Marla Roe on (09/20/18) and informed her of having to change the Home Health facility for the patient and way.  Informed her that I spoke with the facility and asked when did they think a nurse would be able to go out and do the next wound change,and I was told that it should not be no later than Saturday.   Asked Dr. Marla Roe how did she want the patients son to take care of the wound until the nurse comes out, and she said to have the patient's son to do KY gel and gauze until the nurse is able to come see the patient.  Called the patient to speak with the son and she stated that she was on the way to the ED.  I asked her way she was going to the ED and she stated that she thinks she has taken to many pain pills.  Her son asked to speak on the phone.  He asked me to call him back on his phone.  Called him back and he stated that he was on the way to take the patient to the ED in [place]. He said the patient has had some busy days on Tuesday and Wednesday moving around and she's complaining of a lot of pain in her foot.  She has taking the pain medication and they are not sure how much she has taken.  She had a hard time getting out of the bed and she's unable to stay awake.  Informed him that I was calling her to let them know that I sent all her information to the [facility] in [facility] and was told that a nurse may not be able to get out to change the patient's wound no later than Saturday, so Dr. Marla Roe would like for him to change the wound and place KY gel and gauze on the area until the nurse is able to come see the patient.  He verbalized understanding and said he could do that.  I asked the son to let us know how the patient is doing.  He agreed.//AB/CMA

## 2018-09-24 NOTE — Telephone Encounter (Signed)
Called the patient's son back on (09/21/18) regarding the message below.  Informed him that Dr. Marla Roe will be calling in Cipro 500mg  for the patient to take for 5 days.  He verbalized understanding and asked if the prescription would be sent to [facility] [place].  Prescription sent.//AB/CMA

## 2018-09-25 ENCOUNTER — Ambulatory Visit: Payer: Medicare Other | Admitting: Podiatry

## 2018-09-25 DIAGNOSIS — Z20828 Contact with and (suspected) exposure to other viral communicable diseases: Secondary | ICD-10-CM | POA: Diagnosis not present

## 2018-09-26 ENCOUNTER — Telehealth: Payer: Self-pay | Admitting: Cardiovascular Disease

## 2018-09-26 NOTE — Telephone Encounter (Signed)
Called [facility]. Gave the ok to check INR tomorrow.

## 2018-09-26 NOTE — Telephone Encounter (Signed)
Meredith wth [employer] called wanting a verbal order to check patient INR tomorrow, it was orginial scheduled for today.

## 2018-09-27 ENCOUNTER — Encounter (INDEPENDENT_AMBULATORY_CARE_PROVIDER_SITE_OTHER): Payer: Medicare Other | Admitting: Ophthalmology

## 2018-09-27 DIAGNOSIS — I130 Hypertensive heart and chronic kidney disease with heart failure and stage 1 through stage 4 chronic kidney disease, or unspecified chronic kidney disease: Secondary | ICD-10-CM | POA: Diagnosis not present

## 2018-09-27 DIAGNOSIS — I482 Chronic atrial fibrillation, unspecified: Secondary | ICD-10-CM | POA: Diagnosis not present

## 2018-09-27 DIAGNOSIS — I25119 Atherosclerotic heart disease of native coronary artery with unspecified angina pectoris: Secondary | ICD-10-CM | POA: Diagnosis not present

## 2018-09-27 DIAGNOSIS — Z48817 Encounter for surgical aftercare following surgery on the skin and subcutaneous tissue: Secondary | ICD-10-CM | POA: Diagnosis not present

## 2018-09-27 DIAGNOSIS — I69318 Other symptoms and signs involving cognitive functions following cerebral infarction: Secondary | ICD-10-CM | POA: Diagnosis not present

## 2018-09-27 DIAGNOSIS — F0150 Vascular dementia without behavioral disturbance: Secondary | ICD-10-CM | POA: Diagnosis not present

## 2018-09-27 LAB — POCT INR: INR: 2.4 (ref 2.0–3.0)

## 2018-09-28 ENCOUNTER — Telehealth: Payer: Self-pay | Admitting: Cardiovascular Disease

## 2018-09-28 ENCOUNTER — Telehealth: Payer: Self-pay | Admitting: Plastic Surgery

## 2018-09-28 ENCOUNTER — Ambulatory Visit (INDEPENDENT_AMBULATORY_CARE_PROVIDER_SITE_OTHER): Payer: Medicare Other | Admitting: Cardiology

## 2018-09-28 DIAGNOSIS — I482 Chronic atrial fibrillation, unspecified: Secondary | ICD-10-CM | POA: Diagnosis not present

## 2018-09-28 DIAGNOSIS — Z5181 Encounter for therapeutic drug level monitoring: Secondary | ICD-10-CM | POA: Diagnosis not present

## 2018-09-28 NOTE — Patient Instructions (Signed)
Description   Called Brittany [name] home health nurse with [facility] to find out what pt's INR was, she stated it was 2.4,  Instructed Meredith to recheck pt's INR in two weeks. Called and spoke with pt and instructed her to continue on same dosage 1 tablet daily.  Recheck in 2 weeks. Call if any changes [phone].

## 2018-09-28 NOTE — Telephone Encounter (Signed)
Received call from patient's daughter, [name]. The patient was transferred to an assisted living facility yesterday and is in quarantine for 2 weeks. However, she has an appointment on Tuesday, 10/02/2018 with Dr. Marla Roe. If she attends this appointment she will have to start quarantine again for a further 2 weeks. The patient's daughter would like to know if she needs to attend this appointment, and if Ms. Lizana care could be transferred to the assisted living facility.  Contact: [name] [phone].

## 2018-09-28 NOTE — Telephone Encounter (Signed)
New Message      Mo is calling from [facility] to report the pts INR  INR 2.4

## 2018-09-28 NOTE — Telephone Encounter (Signed)
See anticoag encounter from 6/19

## 2018-10-01 DIAGNOSIS — I130 Hypertensive heart and chronic kidney disease with heart failure and stage 1 through stage 4 chronic kidney disease, or unspecified chronic kidney disease: Secondary | ICD-10-CM | POA: Diagnosis not present

## 2018-10-01 DIAGNOSIS — Z48817 Encounter for surgical aftercare following surgery on the skin and subcutaneous tissue: Secondary | ICD-10-CM | POA: Diagnosis not present

## 2018-10-01 DIAGNOSIS — F0150 Vascular dementia without behavioral disturbance: Secondary | ICD-10-CM | POA: Diagnosis not present

## 2018-10-01 DIAGNOSIS — I69318 Other symptoms and signs involving cognitive functions following cerebral infarction: Secondary | ICD-10-CM | POA: Diagnosis not present

## 2018-10-01 DIAGNOSIS — I25119 Atherosclerotic heart disease of native coronary artery with unspecified angina pectoris: Secondary | ICD-10-CM | POA: Diagnosis not present

## 2018-10-01 DIAGNOSIS — I482 Chronic atrial fibrillation, unspecified: Secondary | ICD-10-CM | POA: Diagnosis not present

## 2018-10-01 NOTE — Telephone Encounter (Addendum)
Received a call from [facility] with [facility] regarding the patient's (L) leg wound.  She stated that she just saw the patient for the first time on last Thursday after the patient moved into an assisted living facility.  She's seeing the patient 3x/week.  She stated that the wound looks to be 1/3 deep,has a lot of slough,no granular tissue,no A-cell, and maceration on the bottom of the foot.  She stated that she feels that the wound needs to be debrided.  Asked Mo,RN was the wound changed over the weekend and she stated no it was not.  I also asked her if a picture was taking of the wound today, and she stated that it was not.  She said that pictures are taking at the first visit of the week.  I informed Mo,RN of the orders for the wound care that was faxed to [facility] on (09/20/18).  She stated that she was not given those order, and she's not able to do daily visit for the wound care.  I asked why and she stated that she was not sure it's just the rules of the company.  She can only come (2-3x/w).  Asked Mo,RN when she does not come to see the patient is that a nurse in the facility they can ask to do the dressing changes, and she stated that the nurses there do not do dressing changes.  I informed Mo,RN that the patient is to have an appointment with Dr. Marla Roe on (Tuesday-10/02/18),and the patient's daughter called and left a message regarding the appointment.  I informed her of the message from the daughter.  She stated that she did not know anything about the patient been in quarantine.  Mo,RN stated that she will call and get more information regarding the quarantine and the orders that was sent, and give me a call back.  I informed Mo,RN that we are going to see if Dr. Marla Roe can do a televisit with the patient.  I asked Mo,RN if she will be able to be there if we do the televisit with the patient, and she stated that she's will not be there but she can see if the nurse at the facility could be  with the patient.  I informed her that it would be good if someone could be there when the televisit is done.  She stated if the nurse is not able to be there she will ask if she could do a prn visit so she can be with the patient during the televisit.//AB/CMA

## 2018-10-02 ENCOUNTER — Other Ambulatory Visit: Payer: Self-pay

## 2018-10-02 ENCOUNTER — Encounter: Payer: Self-pay | Admitting: Licensed Clinical Social Worker

## 2018-10-02 ENCOUNTER — Telehealth: Payer: Self-pay | Admitting: Cardiovascular Disease

## 2018-10-02 ENCOUNTER — Telehealth: Payer: Medicare Other | Admitting: Cardiovascular Disease

## 2018-10-02 ENCOUNTER — Ambulatory Visit (INDEPENDENT_AMBULATORY_CARE_PROVIDER_SITE_OTHER): Payer: Medicare Other | Admitting: Plastic Surgery

## 2018-10-02 ENCOUNTER — Non-Acute Institutional Stay: Payer: Medicare Other | Admitting: Licensed Clinical Social Worker

## 2018-10-02 DIAGNOSIS — I25119 Atherosclerotic heart disease of native coronary artery with unspecified angina pectoris: Secondary | ICD-10-CM | POA: Diagnosis not present

## 2018-10-02 DIAGNOSIS — Z48817 Encounter for surgical aftercare following surgery on the skin and subcutaneous tissue: Secondary | ICD-10-CM | POA: Diagnosis not present

## 2018-10-02 DIAGNOSIS — F411 Generalized anxiety disorder: Secondary | ICD-10-CM | POA: Diagnosis not present

## 2018-10-02 DIAGNOSIS — Z515 Encounter for palliative care: Secondary | ICD-10-CM

## 2018-10-02 DIAGNOSIS — I482 Chronic atrial fibrillation, unspecified: Secondary | ICD-10-CM | POA: Diagnosis not present

## 2018-10-02 DIAGNOSIS — I130 Hypertensive heart and chronic kidney disease with heart failure and stage 1 through stage 4 chronic kidney disease, or unspecified chronic kidney disease: Secondary | ICD-10-CM | POA: Diagnosis not present

## 2018-10-02 DIAGNOSIS — S91302A Unspecified open wound, left foot, initial encounter: Secondary | ICD-10-CM

## 2018-10-02 DIAGNOSIS — F0150 Vascular dementia without behavioral disturbance: Secondary | ICD-10-CM | POA: Diagnosis not present

## 2018-10-02 DIAGNOSIS — I2510 Atherosclerotic heart disease of native coronary artery without angina pectoris: Secondary | ICD-10-CM | POA: Diagnosis not present

## 2018-10-02 DIAGNOSIS — I69318 Other symptoms and signs involving cognitive functions following cerebral infarction: Secondary | ICD-10-CM | POA: Diagnosis not present

## 2018-10-02 NOTE — Telephone Encounter (Signed)
New message   Per Mo at [place] states that she is returning a call from [place] on today. Please call.

## 2018-10-02 NOTE — Progress Notes (Signed)
COMMUNITY PALLIATIVE CARE SW NOTE  PATIENT NAME: Brittany [name] DOB: 06/07/1927 MRN: [id number]  PRIMARY CARE PROVIDER: Mayra Neer, MD  RESPONSIBLE PARTY:  Acct ID - Guarantor Home Phone Work Phone Relationship Acct Type  [id number] Brittany [name], Brittany [name]* [phone]  Self P/F     [facility], [place], [place],*   Due to the COVID-19 crisis, this virtual check-in visit was done via telephone from my office and it was initiated and consent given by this patient and or family.   PLAN OF CARE and INTERVENTIONS:             1. GOALS OF CARE/ ADVANCE CARE PLANNING:  Patient's goal is for her foot wound to heal.  She has a DNR. 2. SOCIAL/EMOTIONAL/SPIRITUAL ASSESSMENT/ INTERVENTIONS:  SW conducted a virtual check in visit with patient.  She now resides at [facility].  Patient was alert on the phone and stated that her foot surgery went well and she is waiting for it to heal.  She said her daughter, [name], experienced pancreatitis recently, resulting in patient moving to her son's home in [place].  She then moved to [facility], where she stated she will remain.  She is receiving the care that she requires.  SW attempted to discuss her accidental overdose note in the Epic notes.  She stated she was fine and did not wish to elaborate. 3. PATIENT/CAREGIVER EDUCATION/ COPING:  Patient was alert and oriented and able to express her feelings openly. 4. PERSONAL EMERGENCY PLAN:  Per facility protocol. 5. COMMUNITY RESOURCES COORDINATION/ HEALTH CARE NAVIGATION:  None. 6. FINANCIAL/LEGAL CONCERNS/INTERVENTIONS:  None.     SOCIAL HX:  Social History   Tobacco Use  . Smoking status: Former Smoker    Packs/day: 0.50    Years: 40.00    Pack years: 20.00    Types: Cigarettes    Start date: 05/06/1949    Quit date: 03/06/1990    Years since quitting: 28.5  . Smokeless tobacco: Never Used  Substance Use Topics  . Alcohol use: No    Alcohol/week: 0.0  standard drinks    Comment: Remote social EtOH    CODE STATUS:  DNR ADVANCED DIRECTIVES: N MOST FORM COMPLETE:  N HOSPICE EDUCATION PROVIDED:  Patient was under Hospice care previously. PPS:  Patient stated her appetite is normal. Duration of visit and documentation:  30 minutes      Creola Corn Austin Herd, LCSW

## 2018-10-02 NOTE — Telephone Encounter (Signed)
Spoke with MO and pt lives in an assisted living facility and virtual app with Dr Acie Fredrickson was missed to day  Pt needs appt rescheduled ./cy

## 2018-10-02 NOTE — Progress Notes (Signed)
   Subjective:    Patient ID: Brittany [name], female    DOB: 1927/10/07, 85 y.o.   MRN: [id number]  The patient is an 85 yrs old wf here being evaluated for her foot via telemedicine  She had a wound on the lateral aspect of the left foot. She was admitted for infection at one point and treated with silver alginate. We started her on the sliver sock and then did excision with Acell placed.  She is at assisted living and getting KY gel dressing changes several times at week.  It looks good and like the acell is incorporating nicely.   Review of Systems  Constitutional: Negative.   HENT: Negative.   Eyes: Negative.   Respiratory: Negative.   Cardiovascular: Negative.   Gastrointestinal: Negative.   Musculoskeletal: Negative.   Skin: Negative for wound.  Psychiatric/Behavioral: Negative.        Objective:   Physical Exam HENT:     Head: Normocephalic.  Neurological:     Mental Status: She is alert.  Psychiatric:        Mood and Affect: Mood normal.        Assessment & Plan:     ICD-10-CM   1. Open wound of left foot, initial encounter  S91.302A   I would like to see her in the office in 2 weeks. Continue with the KY dressing changes every other day.  The patient gave consent to have this visit done by telemedicine / virtual visit.  This is also consent for access the chart and treat the patient via this visit. The patient is located at assisted living.  I, the provider, am at the office.  We spent 10 minutes together for the visit.  Joined by nurse with patient.

## 2018-10-03 DIAGNOSIS — I10 Essential (primary) hypertension: Secondary | ICD-10-CM | POA: Diagnosis not present

## 2018-10-03 DIAGNOSIS — H3530 Unspecified macular degeneration: Secondary | ICD-10-CM | POA: Diagnosis not present

## 2018-10-03 DIAGNOSIS — E782 Mixed hyperlipidemia: Secondary | ICD-10-CM | POA: Diagnosis not present

## 2018-10-03 DIAGNOSIS — I2510 Atherosclerotic heart disease of native coronary artery without angina pectoris: Secondary | ICD-10-CM | POA: Diagnosis not present

## 2018-10-03 DIAGNOSIS — G894 Chronic pain syndrome: Secondary | ICD-10-CM | POA: Diagnosis not present

## 2018-10-03 DIAGNOSIS — J449 Chronic obstructive pulmonary disease, unspecified: Secondary | ICD-10-CM | POA: Diagnosis not present

## 2018-10-03 DIAGNOSIS — I5042 Chronic combined systolic (congestive) and diastolic (congestive) heart failure: Secondary | ICD-10-CM | POA: Diagnosis not present

## 2018-10-03 DIAGNOSIS — K219 Gastro-esophageal reflux disease without esophagitis: Secondary | ICD-10-CM | POA: Diagnosis not present

## 2018-10-03 DIAGNOSIS — N3281 Overactive bladder: Secondary | ICD-10-CM | POA: Diagnosis not present

## 2018-10-04 ENCOUNTER — Telehealth: Payer: Self-pay | Admitting: Cardiovascular Disease

## 2018-10-04 DIAGNOSIS — F0150 Vascular dementia without behavioral disturbance: Secondary | ICD-10-CM | POA: Diagnosis not present

## 2018-10-04 DIAGNOSIS — I130 Hypertensive heart and chronic kidney disease with heart failure and stage 1 through stage 4 chronic kidney disease, or unspecified chronic kidney disease: Secondary | ICD-10-CM | POA: Diagnosis not present

## 2018-10-04 DIAGNOSIS — I69318 Other symptoms and signs involving cognitive functions following cerebral infarction: Secondary | ICD-10-CM | POA: Diagnosis not present

## 2018-10-04 DIAGNOSIS — I482 Chronic atrial fibrillation, unspecified: Secondary | ICD-10-CM | POA: Diagnosis not present

## 2018-10-04 DIAGNOSIS — Z48817 Encounter for surgical aftercare following surgery on the skin and subcutaneous tissue: Secondary | ICD-10-CM | POA: Diagnosis not present

## 2018-10-04 DIAGNOSIS — I25119 Atherosclerotic heart disease of native coronary artery with unspecified angina pectoris: Secondary | ICD-10-CM | POA: Diagnosis not present

## 2018-10-04 NOTE — Telephone Encounter (Signed)
New Message     Mo says the pt is in a facility now and she says they need orders to do daily waits     Please call back

## 2018-10-04 NOTE — Telephone Encounter (Signed)
Attempted to return call but there was no answer and VM full.

## 2018-10-05 DIAGNOSIS — Z7901 Long term (current) use of anticoagulants: Secondary | ICD-10-CM | POA: Diagnosis not present

## 2018-10-05 DIAGNOSIS — Z48817 Encounter for surgical aftercare following surgery on the skin and subcutaneous tissue: Secondary | ICD-10-CM | POA: Diagnosis not present

## 2018-10-05 DIAGNOSIS — F0150 Vascular dementia without behavioral disturbance: Secondary | ICD-10-CM | POA: Diagnosis not present

## 2018-10-05 DIAGNOSIS — I69318 Other symptoms and signs involving cognitive functions following cerebral infarction: Secondary | ICD-10-CM | POA: Diagnosis not present

## 2018-10-05 DIAGNOSIS — I25119 Atherosclerotic heart disease of native coronary artery with unspecified angina pectoris: Secondary | ICD-10-CM | POA: Diagnosis not present

## 2018-10-05 DIAGNOSIS — Z5181 Encounter for therapeutic drug level monitoring: Secondary | ICD-10-CM | POA: Diagnosis not present

## 2018-10-05 DIAGNOSIS — I5032 Chronic diastolic (congestive) heart failure: Secondary | ICD-10-CM | POA: Diagnosis not present

## 2018-10-05 DIAGNOSIS — I130 Hypertensive heart and chronic kidney disease with heart failure and stage 1 through stage 4 chronic kidney disease, or unspecified chronic kidney disease: Secondary | ICD-10-CM | POA: Diagnosis not present

## 2018-10-05 DIAGNOSIS — J9611 Chronic respiratory failure with hypoxia: Secondary | ICD-10-CM | POA: Diagnosis not present

## 2018-10-05 DIAGNOSIS — Z87891 Personal history of nicotine dependence: Secondary | ICD-10-CM | POA: Diagnosis not present

## 2018-10-05 DIAGNOSIS — Z953 Presence of xenogenic heart valve: Secondary | ICD-10-CM | POA: Diagnosis not present

## 2018-10-05 DIAGNOSIS — I272 Pulmonary hypertension, unspecified: Secondary | ICD-10-CM | POA: Diagnosis not present

## 2018-10-05 DIAGNOSIS — M21372 Foot drop, left foot: Secondary | ICD-10-CM | POA: Diagnosis not present

## 2018-10-05 DIAGNOSIS — N183 Chronic kidney disease, stage 3 (moderate): Secondary | ICD-10-CM | POA: Diagnosis not present

## 2018-10-05 DIAGNOSIS — J449 Chronic obstructive pulmonary disease, unspecified: Secondary | ICD-10-CM | POA: Diagnosis not present

## 2018-10-05 DIAGNOSIS — I482 Chronic atrial fibrillation, unspecified: Secondary | ICD-10-CM | POA: Diagnosis not present

## 2018-10-05 NOTE — Telephone Encounter (Signed)
Spoke with Mo at [facility].   Pt is now in assisted living facility --[facility] as of 1 wk ago. If daily weights are needed, they need an order faxed to [phone], attn: Dottie.  Daily weights were being done prior to her moving to [facility]. Faxed order to continue daily weights.

## 2018-10-08 DIAGNOSIS — I130 Hypertensive heart and chronic kidney disease with heart failure and stage 1 through stage 4 chronic kidney disease, or unspecified chronic kidney disease: Secondary | ICD-10-CM | POA: Diagnosis not present

## 2018-10-08 DIAGNOSIS — Z48817 Encounter for surgical aftercare following surgery on the skin and subcutaneous tissue: Secondary | ICD-10-CM | POA: Diagnosis not present

## 2018-10-08 DIAGNOSIS — F0150 Vascular dementia without behavioral disturbance: Secondary | ICD-10-CM | POA: Diagnosis not present

## 2018-10-08 DIAGNOSIS — I482 Chronic atrial fibrillation, unspecified: Secondary | ICD-10-CM | POA: Diagnosis not present

## 2018-10-08 DIAGNOSIS — I25119 Atherosclerotic heart disease of native coronary artery with unspecified angina pectoris: Secondary | ICD-10-CM | POA: Diagnosis not present

## 2018-10-08 DIAGNOSIS — I69318 Other symptoms and signs involving cognitive functions following cerebral infarction: Secondary | ICD-10-CM | POA: Diagnosis not present

## 2018-10-08 NOTE — Telephone Encounter (Signed)
Spoke with patient and scheduled her telephone visit with Dr. Acie Fredrickson for July 10 at 1:40 pm. She is aware to have weight and BP available.  Spoke with Jenny Reichmann, care giver at [facility] about appointment and need for vital signs that morning. She verbalized agreement and thanked me for the call.

## 2018-10-09 DIAGNOSIS — I2510 Atherosclerotic heart disease of native coronary artery without angina pectoris: Secondary | ICD-10-CM | POA: Diagnosis not present

## 2018-10-09 DIAGNOSIS — G894 Chronic pain syndrome: Secondary | ICD-10-CM | POA: Diagnosis not present

## 2018-10-11 ENCOUNTER — Ambulatory Visit (INDEPENDENT_AMBULATORY_CARE_PROVIDER_SITE_OTHER): Payer: Medicare Other | Admitting: Cardiovascular Disease

## 2018-10-11 ENCOUNTER — Telehealth: Payer: Self-pay | Admitting: Cardiovascular Disease

## 2018-10-11 DIAGNOSIS — I2510 Atherosclerotic heart disease of native coronary artery without angina pectoris: Secondary | ICD-10-CM | POA: Diagnosis not present

## 2018-10-11 DIAGNOSIS — I10 Essential (primary) hypertension: Secondary | ICD-10-CM | POA: Diagnosis not present

## 2018-10-11 DIAGNOSIS — E782 Mixed hyperlipidemia: Secondary | ICD-10-CM | POA: Diagnosis not present

## 2018-10-11 DIAGNOSIS — I25119 Atherosclerotic heart disease of native coronary artery with unspecified angina pectoris: Secondary | ICD-10-CM | POA: Diagnosis not present

## 2018-10-11 DIAGNOSIS — F0150 Vascular dementia without behavioral disturbance: Secondary | ICD-10-CM | POA: Diagnosis not present

## 2018-10-11 DIAGNOSIS — H3530 Unspecified macular degeneration: Secondary | ICD-10-CM | POA: Diagnosis not present

## 2018-10-11 DIAGNOSIS — I482 Chronic atrial fibrillation, unspecified: Secondary | ICD-10-CM

## 2018-10-11 DIAGNOSIS — N3281 Overactive bladder: Secondary | ICD-10-CM | POA: Diagnosis not present

## 2018-10-11 DIAGNOSIS — Z48817 Encounter for surgical aftercare following surgery on the skin and subcutaneous tissue: Secondary | ICD-10-CM | POA: Diagnosis not present

## 2018-10-11 DIAGNOSIS — G894 Chronic pain syndrome: Secondary | ICD-10-CM | POA: Diagnosis not present

## 2018-10-11 DIAGNOSIS — Z5181 Encounter for therapeutic drug level monitoring: Secondary | ICD-10-CM

## 2018-10-11 DIAGNOSIS — I130 Hypertensive heart and chronic kidney disease with heart failure and stage 1 through stage 4 chronic kidney disease, or unspecified chronic kidney disease: Secondary | ICD-10-CM | POA: Diagnosis not present

## 2018-10-11 DIAGNOSIS — J449 Chronic obstructive pulmonary disease, unspecified: Secondary | ICD-10-CM | POA: Diagnosis not present

## 2018-10-11 DIAGNOSIS — I5042 Chronic combined systolic (congestive) and diastolic (congestive) heart failure: Secondary | ICD-10-CM | POA: Diagnosis not present

## 2018-10-11 DIAGNOSIS — K219 Gastro-esophageal reflux disease without esophagitis: Secondary | ICD-10-CM | POA: Diagnosis not present

## 2018-10-11 DIAGNOSIS — I69318 Other symptoms and signs involving cognitive functions following cerebral infarction: Secondary | ICD-10-CM | POA: Diagnosis not present

## 2018-10-11 LAB — POCT INR: INR: 5.9 — AB (ref 2.0–3.0)

## 2018-10-11 NOTE — Patient Instructions (Signed)
Description   Spoke with Penne Lash, RN with [facility] to provide anticoag instructions. Also spoke with Jenny Reichmann Med Tech at [facility] pt and instructed her to have pt to hold today, hold tomorrow, and hold Saturday's dose then take 1 tablet on Sunday and recheck INR on Monday-faxed over orders to [facility] at [phone].  Normal dose: 1 tablet daily.  Recheck Monday, [place] RN aware. Call with any changes [phone].

## 2018-10-11 NOTE — Telephone Encounter (Signed)
Safari from [facility] was calling to report a critical INR for the patient.  Safari reports that the INR is 5.9.

## 2018-10-11 NOTE — Telephone Encounter (Signed)
Safari returned call and has been provided with anticoag instructions - see separate encounter for dosing details.

## 2018-10-11 NOTE — Telephone Encounter (Signed)
Returned a call to [place] who was not currently with the pt and did not the pt's dose. She did check the INR as it was 5.9, please refer to Anticoagulation [place] for details.

## 2018-10-15 ENCOUNTER — Ambulatory Visit (INDEPENDENT_AMBULATORY_CARE_PROVIDER_SITE_OTHER): Payer: Medicare Other | Admitting: Pharmacist

## 2018-10-15 DIAGNOSIS — I130 Hypertensive heart and chronic kidney disease with heart failure and stage 1 through stage 4 chronic kidney disease, or unspecified chronic kidney disease: Secondary | ICD-10-CM | POA: Diagnosis not present

## 2018-10-15 DIAGNOSIS — I25119 Atherosclerotic heart disease of native coronary artery with unspecified angina pectoris: Secondary | ICD-10-CM | POA: Diagnosis not present

## 2018-10-15 DIAGNOSIS — Z5181 Encounter for therapeutic drug level monitoring: Secondary | ICD-10-CM

## 2018-10-15 DIAGNOSIS — I482 Chronic atrial fibrillation, unspecified: Secondary | ICD-10-CM

## 2018-10-15 DIAGNOSIS — I69318 Other symptoms and signs involving cognitive functions following cerebral infarction: Secondary | ICD-10-CM | POA: Diagnosis not present

## 2018-10-15 DIAGNOSIS — Z48817 Encounter for surgical aftercare following surgery on the skin and subcutaneous tissue: Secondary | ICD-10-CM | POA: Diagnosis not present

## 2018-10-15 DIAGNOSIS — F0150 Vascular dementia without behavioral disturbance: Secondary | ICD-10-CM | POA: Diagnosis not present

## 2018-10-15 LAB — POCT INR: INR: 1.6 — AB (ref 2.0–3.0)

## 2018-10-15 NOTE — Patient Instructions (Signed)
Spoke with Mo, RN with [facility] to provide anticoag instructions. Instructed her to have pt continue 5mg  (1 tablet) daily and recheck INR in 1 week -faxed over orders to [facility] at [phone].  Call with any changes [phone]

## 2018-10-16 ENCOUNTER — Ambulatory Visit (INDEPENDENT_AMBULATORY_CARE_PROVIDER_SITE_OTHER): Payer: Medicare Other | Admitting: Plastic Surgery

## 2018-10-16 ENCOUNTER — Other Ambulatory Visit: Payer: Self-pay

## 2018-10-16 ENCOUNTER — Encounter: Payer: Self-pay | Admitting: Plastic Surgery

## 2018-10-16 DIAGNOSIS — S91302A Unspecified open wound, left foot, initial encounter: Secondary | ICD-10-CM | POA: Diagnosis not present

## 2018-10-16 DIAGNOSIS — F411 Generalized anxiety disorder: Secondary | ICD-10-CM | POA: Diagnosis not present

## 2018-10-16 NOTE — H&P (View-Only) (Signed)
   Subjective:    Patient ID: Brittany [name], female    DOB: 06-09-1927, 85 y.o.   MRN: [id number]  The patient is an 85 yrs old wf joining me by phone and video.  She is frustrated by the pain in her left foot. The wound appears to be larger and not healing well. She states the pain is some of the worst that she has had in a very long time.  [name].  The nurse comes twice a week to do the dressing change.  I was able to see it via the following and the assistant.  I am still not sure the cause of the wound or why it so painful.  She wore the silver socks for several weeks but has not worn them recently.    Review of Systems  Constitutional: Negative for activity change.  Respiratory: Negative for shortness of breath.   Cardiovascular: Positive for leg swelling.  Genitourinary: Negative.   Musculoskeletal: Positive for gait problem.       Objective:   Physical Exam I was able to view the wound by telephone video.    Assessment & Plan:     ICD-10-CM   1. Open wound of left foot, initial encounter  Brittany [name] or Brittany [name] can help arrange the transport and day. Will need to do left foot excision of wound with acell placement. The patient gave consent to have this visit done by telemedicine / virtual visit.  This is also consent for access the chart and treat the patient via this visit. The patient is located at home.  I, the provider, am at the office.  We spent 10 minutes together for the visit.

## 2018-10-16 NOTE — Progress Notes (Signed)
   Subjective:    Patient ID: Brittany [name], female    DOB: 1928/03/25, 85 y.o.   MRN: [id number]  The patient is an 85 yrs old wf joining me by phone and video.  She is frustrated by the pain in her left foot. The wound appears to be larger and not healing well. She states the pain is some of the worst that she has had in a very long time.  [name].  The nurse comes twice a week to do the dressing change.  I was able to see it via the following and the assistant.  I am still not sure the cause of the wound or why it so painful.  She wore the silver socks for several weeks but has not worn them recently.    Review of Systems  Constitutional: Negative for activity change.  Respiratory: Negative for shortness of breath.   Cardiovascular: Positive for leg swelling.  Genitourinary: Negative.   Musculoskeletal: Positive for gait problem.       Objective:   Physical Exam I was able to view the wound by telephone video.    Assessment & Plan:     ICD-10-CM   1. Open wound of left foot, initial encounter  Catron or Catlyn can help arrange the transport and day. Will need to do left foot excision of wound with acell placement. The patient gave consent to have this visit done by telemedicine / virtual visit.  This is also consent for access the chart and treat the patient via this visit. The patient is located at home.  I, the provider, am at the office.  We spent 10 minutes together for the visit.

## 2018-10-17 ENCOUNTER — Telehealth: Payer: Self-pay | Admitting: Plastic Surgery

## 2018-10-17 DIAGNOSIS — Z719 Counseling, unspecified: Secondary | ICD-10-CM

## 2018-10-17 NOTE — Telephone Encounter (Signed)
Patient called inquiring on update for surgery. Told patient to expect a call from office surgery scheduler once surgery is coded and authorized by insurance. Patient also requesting new silver socks. Patient states that she would like to pick those up at time of operation.

## 2018-10-18 DIAGNOSIS — I25119 Atherosclerotic heart disease of native coronary artery with unspecified angina pectoris: Secondary | ICD-10-CM | POA: Diagnosis not present

## 2018-10-18 DIAGNOSIS — F0150 Vascular dementia without behavioral disturbance: Secondary | ICD-10-CM | POA: Diagnosis not present

## 2018-10-18 DIAGNOSIS — I482 Chronic atrial fibrillation, unspecified: Secondary | ICD-10-CM | POA: Diagnosis not present

## 2018-10-18 DIAGNOSIS — I130 Hypertensive heart and chronic kidney disease with heart failure and stage 1 through stage 4 chronic kidney disease, or unspecified chronic kidney disease: Secondary | ICD-10-CM | POA: Diagnosis not present

## 2018-10-18 DIAGNOSIS — Z48817 Encounter for surgical aftercare following surgery on the skin and subcutaneous tissue: Secondary | ICD-10-CM | POA: Diagnosis not present

## 2018-10-18 DIAGNOSIS — I69318 Other symptoms and signs involving cognitive functions following cerebral infarction: Secondary | ICD-10-CM | POA: Diagnosis not present

## 2018-10-18 NOTE — Telephone Encounter (Signed)
I will mail them to the patient! She called back and requested them to be sent to her so that she wouldn't have to keep up with them at surgery time.

## 2018-10-18 NOTE — Telephone Encounter (Signed)
Who handles the silver socks?

## 2018-10-19 ENCOUNTER — Other Ambulatory Visit: Payer: Self-pay

## 2018-10-19 ENCOUNTER — Encounter (HOSPITAL_COMMUNITY): Payer: Self-pay | Admitting: *Deleted

## 2018-10-19 ENCOUNTER — Telehealth: Payer: Self-pay | Admitting: Plastic Surgery

## 2018-10-19 ENCOUNTER — Telehealth (INDEPENDENT_AMBULATORY_CARE_PROVIDER_SITE_OTHER): Payer: Medicare Other | Admitting: Cardiovascular Disease

## 2018-10-19 ENCOUNTER — Telehealth: Payer: Self-pay | Admitting: Cardiovascular Disease

## 2018-10-19 ENCOUNTER — Encounter: Payer: Self-pay | Admitting: Cardiovascular Disease

## 2018-10-19 VITALS — BP 136/63 | HR 65 | Ht 61.0 in | Wt 127.0 lb

## 2018-10-19 DIAGNOSIS — I4891 Unspecified atrial fibrillation: Secondary | ICD-10-CM | POA: Diagnosis not present

## 2018-10-19 DIAGNOSIS — J449 Chronic obstructive pulmonary disease, unspecified: Secondary | ICD-10-CM | POA: Diagnosis not present

## 2018-10-19 DIAGNOSIS — Z952 Presence of prosthetic heart valve: Secondary | ICD-10-CM | POA: Diagnosis not present

## 2018-10-19 DIAGNOSIS — Z0000 Encounter for general adult medical examination without abnormal findings: Secondary | ICD-10-CM | POA: Diagnosis not present

## 2018-10-19 DIAGNOSIS — N189 Chronic kidney disease, unspecified: Secondary | ICD-10-CM | POA: Diagnosis not present

## 2018-10-19 DIAGNOSIS — I5032 Chronic diastolic (congestive) heart failure: Secondary | ICD-10-CM | POA: Diagnosis not present

## 2018-10-19 DIAGNOSIS — I350 Nonrheumatic aortic (valve) stenosis: Secondary | ICD-10-CM | POA: Diagnosis not present

## 2018-10-19 DIAGNOSIS — Z7189 Other specified counseling: Secondary | ICD-10-CM

## 2018-10-19 DIAGNOSIS — E782 Mixed hyperlipidemia: Secondary | ICD-10-CM | POA: Diagnosis not present

## 2018-10-19 NOTE — Progress Notes (Signed)
Virtual Visit via Telephone Note   This visit type was conducted due to national recommendations for restrictions regarding the COVID-19 Pandemic (e.g. social distancing) in an effort to limit this patient's exposure and mitigate transmission in our community.  Due to her co-morbid illnesses, this patient is at least at moderate risk for complications without adequate follow up.  This format is felt to be most appropriate for this patient at this time.  The patient did not have access to video technology/had technical difficulties with video requiring transitioning to audio format only (telephone).  All issues noted in this document were discussed and addressed.  No physical exam could be performed with this format.  Please refer to the patient's chart for her  consent to telehealth for Brittany [name].   Date:  10/19/2018   ID:  Brittany [name], DOB September 25, 1927, MRN [id number]  Patient Location: Home Provider Location: Office  PCP:  Brittany Neer, MD  Cardiologist:  Brittany Moores, MD  Electrophysiologist:  None   Evaluation Performed:  Follow-Up Visit  1. Aortic valve replacement 2. Dyslipidemia 3. Hypertension 4. 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  5. Atrial fibrillation   Brittany [name] is an 85 yo with aortic valve replacement, HTN, and hyperlipidemia. She has not had any cardiac complaints recently. She complains of easy bruising on her arms.   She had an episode of severe heart burn this past week.   She's had a stroke over the past year. This has left her with some balance issues and some loss of peripheral vision.  Nov. 4, 2014:  Brittany [name] is doing well from a cardiac standpoint. She is having problems with interstitial cystitis. She had a procedure last week and had a small bladder tear which they are letting heal. She is still having some significant pain.   Aug 27, 2013:  Brittany [name] is doing OK. She is not having any problems . Still very  active.  She had an AVR   She is having problems with interstitial cyctitis.   November 06, 2013:  Brittany [name] was admitted to the [name] twice on July , 2015: DC summary :  85 year old Caucasian female with past medical history significant for hypertension, hyperlipidemia, history of CVA permanent atrial fibrillation on Xarelto for the last 2 years, and history of aortic valve replacement with bioprosthetic valve in 2005, here with chest pain. She was recently admitted to the [name] for management of malignant HTN , diastolic HF and headache. She was seen by Dr Brittany [name] and Brittany [name] on 10/16/2013. She returns within 24 hrs for symptoms of chest pain . Pt states that earlier in the afternoon while she was trying to sleep post lunch she started experiencing soreness , heaviness "someone sitting on my chest" on the left side radiating to left arm with associated mild SOB. This improved with SL NTG , however, she continues to have some chest pain. Pt denied any orthopnea, PND , LE edema , syncope, claudication , palpitation etc . She reports medication compliance. She bruises easily in her skin and once episodes of epistaxis several months ago.  She was admitted. Xarelto was held in anticipation of coronary angiography. ASA, statin, BB, IV NTG and heparin added. She ruled in for NSTEMI with troponin of 0.63. Left heart cath revealed severe, fibrotic disease in the ostial right coronary artery which was the culprit lesion. This was successfully stented with a 2.5 x 14 resolute drug-eluting stent, postdilated to 3.3 mm in  diameter. She was started on plavix, however, a P2Y12 came back at 289 so she was started on Brilinta. Cardizem cd 120 was added for afib RVR with improvement. Due to mild cough Lisinopril was changed to irbesartan. Xarelto was changed to coumadin and she will follow up two days after DC in the coumadin clinic   She now presents for follow up . She has some MSK pain ( hurts when she  presses her chest. She has had some bleeding. She woke up in the morning with a small skin tear on her right ankle . There was lots of blood in the bed. The bleeding eventually stopped with pressure. She also has had a nose bleed.   She has not had any chest pain and her energy levels have been better. INR levels have been OK.   Oct. 29, 2015:  Brittany.  Brittany [name] feeling better. She had a bad viral illness last week and thinks that she might have the flu. She is getting better.  She's had some problems with anemia and she is turned in 3 sets of guaiac cards. She has had some dyspnea and was started on Lasix on a QOD schedule.  She is now off Brilinta. She had a major bleeding from he right foot. She was on brilinta because She had an inadequate response to plavix originally but later we discovered that she was also on Omeprazole. We have stopped the omeprazone and her PRU is better. She is taking Nexium instead.   Off Eliquis. Is back on warfarin. doing well     June 20, 2014:  Brittany [name] is a 85 y.o. female who presents for follow up .  She has had prolonged nausea and abdominal pain.  Worse with eating. No cardiac symptoms .   Sept. 8, 2016:    Doing well from a cardiac issue.  has been diagnosed with COPD ( was a former smoker, 25 years ago )  Still has some stomach issues.   Is still weak but is gradually improving  Having intermittant nose bleeds - is on Plavix and couamdin  June 18, 2015:  Doing ok from a cardiac standpoint Walks with the walker - has some leg edema .  Can use a cane when she is in her house Avoids eating salt-does not eat out much .     June 26, 29017:  Brittany [name] has been complaining of increasing shortness of breath / cough over the past several weeks.    We have backed off her diuretic because of the concern for renal insufficiency.   Her previous lasix dose was 20 mg on Mondays and Thursday.     Last week she  called Brittany [name] and we increased her Lasix.  40 mg a day for 2-3 days. She seems to be feeling quite a bit better.  She's lost 7 pounds. She is back close to normal .    Cough is better,   Still short of breath   Oct. 4, 2017:  Brittany [name] is seen back today  Still has shortness of breath, especially after she is rushing around .  Has had low salt level and her lasix was decreased and she has been limiting her free water intake.   Aug 09, 2016:  Brittany [name] is seen back for follow up for her CAD, HTN, atrial fib  and chronic diastolic CHF  No active cardiac issues  July 13, 2017:  Seen today as a work in visit . Has very  swollen and erythematous legs .  She has had swollen legs for the past several weeks.  We have doubled her Lasix.  She did not notice any increase in urination with doubling of the Lasix. The legs have continued to become more more red and more painful.  She was wearing High support hose.  She has a deep ring around the calf of her leg representing edema.  She takes Lasix on a PRN basis - based on weight  Does not put out much urine even when she takes the lasix   No CP or dyspnea.    She has gained 10 lbs of fluid over the past 3-4 weeks   November 06, 2017:  Brittany [name] is seen today for follow-up visit of her acute on chronic diastolic congestive heart failure and atrial fibrillation. She had lots of edema when I last saw her.  We changed the furosemide to torsemide and this seems to be working great.  No chest pain or shortness of breath.  Her energy level seem to be stable.  Has bilateral tingling in her fingertips.  May be related to waking and holding on to her walker .   April 24, 2018: Brittany [name] is seen with caregiver, Maudie Mercury .   Follow-up for her chronic diastolic congestive heart failure and atrial fibrillation.  Has a nonhealing wound on her left foot.   Has the wound care nurse sees her   The edema in her legs has largely resolved.    She  now takes the Torsemide and Kdur when her legs do not resolve with leg elevation   Chief Complaint:    July 10 , 2020    Brittany [name] is a 85 y.o. female with history of chronic diastolic congestive heart failure.  She has a history of coronary stenting.  She has atrial fibrillation and is been on chronic anticoagulation.  She also has a history of hypertension and hyperlipidemia.  Doing well.    Having an issue with a non healing area on her left foot. Is not having any significant swelling .   Very sore .  She is on coumadin.   If the surgery required holding the coumadin for more than 1 day  she will need bridging.  Eating well,  Has been at the SNF - [facility].    The patient does not have symptoms concerning for COVID-19 infection (fever, chills, cough, or new shortness of breath).    Past Medical History:  Diagnosis Date  . Abnormal liver diagnostic imaging    suspected cirrhosis  . Arthritis   . Atrial fibrillation ([place])   . Chronic back pain   . COPD (chronic obstructive pulmonary disease) ([place])   . Diverticulosis   . Dyslipidemia    takes Lipitor daily  . Dysrhythmia    Atrial Fibrillation  . Foot drop    SINCE 1987  . GERD (gastroesophageal reflux disease)   . H/O hiatal hernia   . Hearing impaired    Bilateral hearing aids  . History of bacterial endocarditis   . History of gastritis   . History of stomach ulcers    many yrs ago  . History of TIA (transient ischemic attack)    2013-- RESIDUAL PERIPHERAL VISION RIGHT EYE--  RESOLVED  . Hypertension   . IC (interstitial cystitis)   . Macular degeneration   . Myocardial infarction ([place])   . Pulmonary hypertension ([place]) 10/2013   Based on TTE  . S/P aortic valve  replacement    2005  . Stroke [facility])    TIA  . Urine incontinence   . Wears dentures   . Wears glasses    Past Surgical History:  Procedure Laterality Date  . ABDOMINAL HYSTERECTOMY  1967  . AORTIC VALVE REPLACEMENT  09-29-2003     [name] pericardial tissue valve  . APPENDECTOMY  1933  . APPLICATION OF A-CELL OF EXTREMITY Left 09/06/2018   Procedure: DEBRIDMENT LEFT FOOT WITH ACELL PLACEMENT;  Surgeon: Wallace Going, DO;  Location: [place];  Service: Plastics;  Laterality: Left;  . BIOPSY  02/01/2018   Procedure: BIOPSY;  Surgeon: Rush Landmark Telford Nab., MD;  Location: [place];  Service: Gastroenterology;;  . CARDIAC CATHETERIZATION  07-15-2003 DR HELEN PRESTON   MODERATE TO MODERATELY SEVERE CALCIFIC AORTIC STENOSIS/  NORMAL CORONARY ARTERIES  . CARDIOVASCULAR STRESS TEST  01-30-2012  DR NASHER   NORMAL NUCLEAR STUDY/  EF 73%/  NORMAL LVF  . CARPAL TUNNEL RELEASE Bilateral   . CATARACT EXTRACTION W/ INTRAOCULAR LENS  IMPLANT, BILATERAL    . CHOLECYSTECTOMY  1993  . CYSTO WITH HYDRODISTENSION N/A 02/05/2013   Procedure: CYSTOSCOPY/HYDRODISTENSION;  Surgeon: Irine Seal, MD;  Location: [facility];  Service: Urology;  Laterality: N/A;  . CYSTO/ HYDRODISTENTION/ INSTILLATION CLORPACTIN  MULTIPLE  last one 2009  . DILATION AND CURETTAGE OF UTERUS    . ERCP N/A 08/10/2012   Procedure: ENDOSCOPIC RETROGRADE CHOLANGIOPANCREATOGRAPHY (ERCP);  Surgeon: Inda Castle, MD;  Location: [place];  Service: Gastroenterology;  Laterality: N/A;  . ESOPHAGOGASTRODUODENOSCOPY (EGD) WITH PROPOFOL N/A 08/18/2014   Procedure: ESOPHAGOGASTRODUODENOSCOPY (EGD) WITH PROPOFOL;  Surgeon: Inda Castle, MD;  Location: [facility];  Service: Endoscopy;  Laterality: N/A;  . ESOPHAGOGASTRODUODENOSCOPY (EGD) WITH PROPOFOL N/A 02/01/2018   Procedure: ESOPHAGOGASTRODUODENOSCOPY (EGD) WITH PROPOFOL;  Surgeon: Rush Landmark Telford Nab., MD;  Location: [place];  Service: Gastroenterology;  Laterality: N/A;  . FOREIGN BODY REMOVAL  02/01/2018   Procedure: FOREIGN BODY REMOVAL;  Surgeon: Rush Landmark Telford Nab., MD;  Location: [place];  Service: Gastroenterology;;  . LEFT HEART CATHETERIZATION WITH CORONARY  ANGIOGRAM N/A 10/18/2013   Procedure: LEFT HEART CATHETERIZATION WITH CORONARY ANGIOGRAM;  Surgeon: Jettie Booze, MD;  Location: [name] CATH LAB;  Service: Cardiovascular;  Laterality: N/A;  . LUMBAR [place] &  2007  . MACROPLASTIQUE URETHRAL IMPLANTATION  08-27-2009  . MINI-OPEN RIGHT ROTATOR CUFF REPAIR  07-12-2001  . RIGHT SHOULDER ARTHROSCOPY W/ DEBRIDEMENT ROTATOR CUFF AND LABRAL TEAR/ ACROMINOPLASTY/ DISTAL CLAVICLE EXCISION/ CA LIGAMENT RELEASE  10-08-1999  . TEE WITHOUT CARDIOVERSION N/A 06/20/2018   Procedure: TRANSESOPHAGEAL ECHOCARDIOGRAM (TEE);  Surgeon: Jerline Pain, MD;  Location: [facility] ENDOSCOPY;  Service: Cardiovascular;  Laterality: N/A;  . TONSILLECTOMY AND ADENOIDECTOMY    . TRANSTHORACIC ECHOCARDIOGRAM  08-10-2011   MILD LVH/  EF 55-60%/  NORMAL AVR TISSUE/ MILD MR/  MODERATE DILATED LA/  MILD DILATED RA  . TRANSVAGINAL TAPE PROCEDURE  07-30-2002  . VEIN LIGATION  1969   RIGHT LOWER LEG     Current Meds  Medication Sig  . acetaminophen (TYLENOL) 500 MG tablet Take 500 mg by mouth every 6 (six) hours as needed (for pain/fever).  Marland Kitchen albuterol (VENTOLIN HFA) 108 (90 Base) MCG/ACT inhaler Inhale 1 puff into the lungs every 4 (four) hours as needed for wheezing or shortness of breath.  Marland Kitchen atorvastatin (LIPITOR) 20 MG tablet Take 20 mg by mouth daily.  . busPIRone (BUSPAR) 15 MG tablet Take 1 tablet (15 mg  total) by mouth 2 (two) times daily.  . carboxymethylcellulose 1 % ophthalmic solution Place 1 drop into both eyes at bedtime.  . cycloSPORINE (RESTASIS) 0.05 % ophthalmic emulsion Place 1 drop into both eyes 2 (two) times daily.  . diazepam (VALIUM) 5 MG tablet Take 5 mg by mouth at bedtime as needed for anxiety.  Marland Kitchen diltiazem (CARDIZEM) 120 MG tablet Take 120 mg by mouth 2 (two) times daily.  . Eyelid Cleansers (OCUSOFT LID SCRUB EX) Apply 1 application topically 2 (two) times a day.  . famotidine (PEPCID) 40 MG tablet Take 40 mg by mouth at bedtime.  Marland Kitchen  HYDROcodone-acetaminophen (NORCO/VICODIN) 5-325 MG tablet Take 1 tablet by mouth every 6 (six) hours as needed for severe pain.  . Multiple Vitamins-Minerals (PRESERVISION AREDS 2 PO) Take 1 tablet by mouth daily.  . nitroGLYCERIN (NITROSTAT) 0.4 MG SL tablet Place 1 tablet (0.4 mg total) under the tongue every 5 (five) minutes as needed for chest pain.  Marland Kitchen ondansetron (ZOFRAN) 4 MG tablet Take 4 mg by mouth every 4 (four) hours as needed for nausea or vomiting.  Marland Kitchen oxybutynin (DITROPAN-XL) 5 MG 24 hr tablet Take 5 mg by mouth 2 (two) times a day.  . pantoprazole (PROTONIX) 40 MG tablet Take 40 mg by mouth 2 (two) times daily.  . Polyvinyl Alcohol-Povidone (REFRESH OP) Place 1 drop into both eyes 2 (two) times daily.  . potassium chloride (K-DUR) 10 MEQ tablet Take 10 mEq by mouth daily.   Marland Kitchen torsemide (DEMADEX) 20 MG tablet Take 20 mg by mouth daily.  Marland Kitchen warfarin (COUMADIN) 5 MG tablet Take 5 mg by mouth daily.     Allergies:   Amlodipine, Amoxicillin, Carafate [sucralfate], Cephalexin, Ciprofloxacin, Codeine, Irbesartan, Morphine and related, Neurontin [gabapentin], Nitrofurantoin monohyd macro, Penicillins, Prednisone, and Sulfa antibiotics   Social History   Tobacco Use  . Smoking status: Former Smoker    Packs/day: 0.50    Years: 40.00    Pack years: 20.00    Types: Cigarettes    Start date: 05/06/1949    Quit date: 03/06/1990    Years since quitting: 28.6  . Smokeless tobacco: Never Used  Substance Use Topics  . Alcohol use: No    Alcohol/week: 0.0 standard drinks    Comment: Remote social EtOH  . Drug use: No     Family Hx: The patient's family history includes Alcohol abuse in her sister and sister; Bipolar disorder in her brother and son; Colon cancer in her maternal aunt; Coronary artery disease in her brother; Esophageal cancer in her maternal grandfather; Heart disease in her father; Stomach cancer in her maternal grandmother. There is no history of Lung disease.  ROS:    Please see the history of present illness.      All other systems reviewed and are negative.   Prior CV studies:   The following studies were reviewed today:     Labs/Other Tests and Data Reviewed:    EKG:  No ECG reviewed.  Recent Labs: 04/19/2018: TSH 1.33 05/08/2018: Magnesium 2.0 06/23/2018: ALT 26; B Natriuretic Peptide 274.0 09/06/2018: BUN 20; Creatinine, Ser 0.92; Hemoglobin 10.5; Platelets 240; Potassium 3.8; Sodium 128   Recent Lipid Panel Lab Results  Component Value Date/Time   CHOL 159 10/12/2013 05:25 AM   TRIG 53 10/12/2013 05:25 AM   HDL 62 10/12/2013 05:25 AM   CHOLHDL 2.6 10/12/2013 05:25 AM   LDLCALC 86 10/12/2013 05:25 AM    Wt Readings from Last 3 Encounters:  10/19/18 127  lb (57.6 kg)  09/06/18 127 lb (57.6 kg)  08/09/18 125 lb (56.7 kg)     Objective:    Vital Signs:  BP 136/63   Pulse 65   Ht 5\' 1"  (1.549 m)   Wt 127 lb (57.6 kg)   BMI 24.00 kg/m      ASSESSMENT & PLAN:    1. Chronic diastolic CHF:   Doing well .   Takes torsemide and Kdur as needed.     contiue to watch her salt.    Needs to have foot surgery soon for a non healing wound .   She is at low risk for any cardiac issues.   .  She is on coumadin.   If the surgery required holding the coumadin for more than 1 day  she will need bridging.   2.  CAD :    No angina   3. AVR - stable    COVID-19 Education: The signs and symptoms of COVID-19 were discussed with the patient and how to seek care for testing (follow up with PCP or arrange E-visit).  The importance of social distancing was discussed today.  Time:   Today, I have spent  18   minutes with the patient with telehealth technology discussing the above problems.     Medication Adjustments/Labs and Tests Ordered: Current medicines are reviewed at length with the patient today.  Concerns regarding medicines are outlined above.   Tests Ordered: No orders of the defined types were placed in this encounter.    Medication Changes: No orders of the defined types were placed in this encounter.   Follow Up:  In Person in 6 month(s)  Signed, Brittany Moores, MD  10/19/2018 2:24 PM    [facility]

## 2018-10-19 NOTE — Telephone Encounter (Signed)
Call to [facility], RN to clarify wound/.dressing change orders for her foot wound Per Dr. Eusebio Friendly last note- pt was to wear "silver socks" - but she has not been wearing the socks- so the orders are for the dressing change to be done 2 times a week- using KY gel & adaptic I informed the RN that pt will be having surgery on Monday 10/22/18- for debridement & placement of A-cell-so the orders will most likely change again post-op Home health will call for any other questions [facility]

## 2018-10-19 NOTE — Telephone Encounter (Signed)
New Message         [facility] Pre-operative Risk Assessment    Request for surgical clearance:  1. What type of surgery is being performed? Debridement of Left foot wound and application of A-cell   2. When is this surgery scheduled? 10/22/18 @ 11:45 at [facility]   3. What type of clearance is required (medical clearance vs. Pharmacy clearance to hold med vs. Both)? Both   4. Are there any medications that need to be held prior to surgery and how long? Wondering if she needs to hold coumadin and if so, do they need to use a bridge   5. Practice name and name of physician performing surgery?  [facility] [place]   6. What is your office phone number [phone]   7.   What is your office fax number [phone]  8.   Anesthesia type (None, local, MAC, general) ? General    Brittany [name] 10/19/2018, 3:31 PM  _________________________________________________________________   (provider comments below)

## 2018-10-19 NOTE — Telephone Encounter (Signed)
Reeived called from [place], Therapist, sports with [facility] requesting a call back from clinical staff to clarify wound care orders. Contact: [phone]

## 2018-10-19 NOTE — Telephone Encounter (Signed)
Received call from [place] from [facility] requesting call back from clinical staff. She would like to know if the patient needs to discontinue taking Coumadin prior to surgery. Contact [phone]

## 2018-10-19 NOTE — Telephone Encounter (Signed)
Spoke with Angie from the surgery center and reviewed advice from [place], [facility]. Angie states she cannot see the notes from our office so I advised I will fax to her. She verified fax number and confirmation was received.

## 2018-10-19 NOTE — Patient Instructions (Signed)

## 2018-10-19 NOTE — Telephone Encounter (Signed)
Pt takes warfarin for afib with CHADS2VASc score of 8 (age x2, sex, CHF, HTN, CAD, stroke). Would see if they are comfortable with pt continuing on warfarin for low bleed risk procedure since she would require Lovenox bridging if she needs to hold warfarin. This would be difficult to manage at 4pm on a Friday for a Monday procedure in an elderly patient without seeing pt in person.

## 2018-10-19 NOTE — Pre-Procedure Instructions (Signed)
   Brittany [name]  10/19/2018    Your procedure is scheduled on Monday, October 22, 2018  Report to [facility] Admitting at 9:30 A.M.  Call this number if you have problems the morning of surgery:  [phone]   Remember: Brush your teeth with your regular toothpaste the morning of surgery.  Do not eat or drink after midnight Sunday, October 21, 2018   Take these medicines the morning of surgery with A SIP OF WATER:  busPIRone (BUSPAR),  diltiazem (CARDIZEM),   pantoprazole (PROTONIX),   eye drops If needed: Tylenol or HYDROcodone-acetaminophen (NORCO/VICODIN) for pain If needed: ondansetron [facility]) for nausea or vomiting If needed: nitroGLYCERIN (NITROSTAT)  For chest pain If needed:albuterol (VENTOLIN HFA)  inhaler ( bring inhaler in with you on day of surgery)   Stop taking vitamins, fish oil and herbal medications. Do not take any NSAIDs ie: Ibuprofen, Advil, Naproxen (Aleve), Motrin, BC and Goody Powder; stop now.  Please follow surgeon's instructions regarding warfarin (COUMADIN).  If no instructions were provided, please call surgeon's office.   Do not wear jewelry, make-up or nail polish.  Do not wear lotions, powders, or perfumes, or deodorant.  Do not shave 48 hours prior to surgery.   Do not bring valuables to the hospital.  [facility] is not responsible for any belongings or valuables.  Contacts, dentures or bridgework may not be worn into surgery.  For patients admitted to the hospital, discharge time will be determined by your treatment team.  Patients discharged the day of surgery will not be allowed to drive home.   Please read over the following fact sheets that you were given.

## 2018-10-19 NOTE — Telephone Encounter (Signed)
Call to [facility], RN to clarify wound/.dressing change orders for her foot wound Per Dr. Eusebio Friendly last note- pt was to wear "silver socks" - but she has not been wearing the socks- so the orders are for the dressing change to be done 2 times a week- using KY gel & adaptic I informed the RN that pt will be having surgery on Monday 10/22/18- for debridement & placement of A-cell-so the orders will most likely change again post-op Home health will call for any other questions [facility]

## 2018-10-19 NOTE — Telephone Encounter (Signed)
Received called from [place], [place] with E

## 2018-10-22 ENCOUNTER — Telehealth: Payer: Self-pay | Admitting: Plastic Surgery

## 2018-10-22 NOTE — Telephone Encounter (Signed)
I called [place] and spoke with the Coumadin clinic. I advised that the surgery has been rescheduled and an appointment with the Coumadin clinic has been scheduled. I have contacted Jenny Reichmann at [facility] and advised of the appointment changes, as well as notifying the patient of the information. The following has been relayed to all necessary parties:   Her surgery is 10/31/18 at 3:00 PM - per patients request for a later start time.  Her appointment with Cardiology is 10/23/18 at 1:30 PM with the Coumadin Clinic for the Lovenox bridge.  Her POV is rescheduled to 11/13/18 at 1:20 PM.

## 2018-10-22 NOTE — Telephone Encounter (Signed)
-----   Message from Isaiah Serge, NP sent at 10/22/2018  8:45 AM EDT ----- I do not know if pt is having surgery on her coumadin.  If it was cancelled she needs appt with pharm for coumadin lovenox crossover.

## 2018-10-22 NOTE — Telephone Encounter (Signed)
Received call from [facility] with [facility]. Patient's surgery for today has been cancelled due to unable to bridge medication. Once new surgery date is established, we will need to submit a new clearance for patient to bridge from warfarin to lovenox. Patient will need to be seen in person by [facility] to receive clearance for surgery. Will work with surgery scheduler to schedule new date for surgery as well as RNFA to reach out to [facility] for new clearance.

## 2018-10-22 NOTE — Telephone Encounter (Signed)
Spoke with Makayla from [facility], she has advised that pt's surgery for today has been cancelled. They are working on it and will let us know when it will be rescheduled for so we can get her into see Coumadin for Lovenox bridge.  Will fwd to coumadin clinic as well to let them know.

## 2018-10-22 NOTE — Telephone Encounter (Addendum)
Called [place] back and spoke with Davy Pique regaining the message below.  Asked Davy Pique who prescribes the coumadin for the patient.  She stated Dr. Acie Fredrickson (Cardio).  Called Dr. Acie Fredrickson office and spoke with Ashok Norris regaining getting a request for surgical clearance.  Informed her that the patient is to have her surgery on Mon (10/22/18).  Ashok Norris was able to take the information for the clearance while on the phone.  Then I received a call from [facility] to let me know that [facility] has placed a note in Epic regarding the patient been on the Warfarin and why she's on it and what will have to be done if she stops it.  They wanted to know if Dr. Marla Roe felt comfortable with the patient continuing on the Warfarin for low bleed risk procedure since the patient would require Lovenox bridging if she needs to hold Warfarin.  This would be difficult to manage at 4pm on a Friday.  Called and spoke with Dr. Marla Roe and informed her of the message from [place], and the message from Dr. Elmarie Shiley office.  Dr. Marla Roe asked if we could fine out if the patient had taking her Warfarin today.  If she has we will need to reschedule the surgery.  If she has not then we will start her on Lovenox 40mg  today,Sat, and Sun.  Called Sonya with [facility] back to see if the patient has been given her Warfarin dose today.  She stated no the patient has not had the Warfarin today.  I informed her of the message and orders from Dr. Marla Roe regarding the Lovenox.  Davy Pique stated that she will need a verbal order from [facility] for Lovenox 40mg  x 5 days, and then a call will need to be made to [facility] at [phone]) to have the medication delivered.  Called Dr. Marla Roe back and informed her of what Davy Pique with [facility] said regarding the orders for the Lovenox.  She asked if Bonita,RN would call [facility] and ask them if they would call [facility]  with verbal orders for the Lovenox for the patient.  Bonita,RN called [facility] and spoke with the nurse and explained the situation.  Doroteo Bradford was told by the nurse that because of the time on a Friday they would not be able to get the Lovenox started today, but could start it on Saturday.  Called Dr. Marla Roe back and informed her of what the nurse said.  Dr. Marla Roe said we will have to reschedule the surgery to be on the safe side.  She said she will call Shayna to see how soon we can reschedule the patient.  Called [place] with [place] back and explained to her the situation and that Dr. Marla Roe wants to be on the save side and reschedule the surgery.  Sonya agreed and verbalized understanding.//AB/CMA

## 2018-10-23 ENCOUNTER — Ambulatory Visit (INDEPENDENT_AMBULATORY_CARE_PROVIDER_SITE_OTHER): Payer: Medicare Other | Admitting: *Deleted

## 2018-10-23 ENCOUNTER — Other Ambulatory Visit: Payer: Self-pay

## 2018-10-23 DIAGNOSIS — I214 Non-ST elevation (NSTEMI) myocardial infarction: Secondary | ICD-10-CM | POA: Diagnosis not present

## 2018-10-23 DIAGNOSIS — I482 Chronic atrial fibrillation, unspecified: Secondary | ICD-10-CM

## 2018-10-23 DIAGNOSIS — Z5181 Encounter for therapeutic drug level monitoring: Secondary | ICD-10-CM

## 2018-10-23 LAB — POCT INR: INR: 5.6 — AB (ref 2.0–3.0)

## 2018-10-23 NOTE — Patient Instructions (Addendum)
7/14: No Coumadin, no Lovenox  7/15: No Coumadin, no Lovenox  7/16: No Coumadin, no Lovenox  7/17: Inject Lovenox 80 mg in the fatty abdominal tissue at least 2 inches from the belly button daily at 7pm rotate sites. No Coumadin.  7/18:Inject Lovenox in the fatty tissue at 7pm rotate sites . No Coumadin.  7/19: Inject Lovenox in the fatty tissue at 7pm. No Coumadin.  7/20: Inject Lovenox in the fatty tissue at 7pm. No Coumadin.  7/21: No Coumadin and  No Lovenox.  7/22: Procedure Day - No Lovenox - Resume Coumadin in the evening or as directed by doctor (take an extra half tablet with usual dose for 2 days then resume normal dose).  7/23: Resume Lovenox inject in the fatty tissue at 8am and take Coumadin.  7/24: Inject Lovenox in the fatty tissue at 8am and take Coumadin.  7/25: Inject Lovenox in the fatty tissue at 8am and take Coumadin.  7/26: Inject Lovenox in the fatty tissue at 8am and take Coumadin.  7/27: Inject Lovenox in the fatty tissue at 8am and take Coumadin.  7/28: Inject Lovenox in the fatty tissue at 8am,  Coumadin appt to check INR.

## 2018-10-24 ENCOUNTER — Telehealth: Payer: Self-pay | Admitting: Cardiovascular Disease

## 2018-10-24 ENCOUNTER — Telehealth: Payer: Self-pay | Admitting: *Deleted

## 2018-10-24 DIAGNOSIS — I69318 Other symptoms and signs involving cognitive functions following cerebral infarction: Secondary | ICD-10-CM | POA: Diagnosis not present

## 2018-10-24 DIAGNOSIS — I25119 Atherosclerotic heart disease of native coronary artery with unspecified angina pectoris: Secondary | ICD-10-CM | POA: Diagnosis not present

## 2018-10-24 DIAGNOSIS — I482 Chronic atrial fibrillation, unspecified: Secondary | ICD-10-CM | POA: Diagnosis not present

## 2018-10-24 DIAGNOSIS — Z48817 Encounter for surgical aftercare following surgery on the skin and subcutaneous tissue: Secondary | ICD-10-CM | POA: Diagnosis not present

## 2018-10-24 DIAGNOSIS — F0150 Vascular dementia without behavioral disturbance: Secondary | ICD-10-CM | POA: Diagnosis not present

## 2018-10-24 DIAGNOSIS — I130 Hypertensive heart and chronic kidney disease with heart failure and stage 1 through stage 4 chronic kidney disease, or unspecified chronic kidney disease: Secondary | ICD-10-CM | POA: Diagnosis not present

## 2018-10-24 NOTE — Telephone Encounter (Signed)
Orders for lovenox to be given at 4PM instead of 7PM were faxed over to [facility] [phone])  7/14: No Coumadin, no Lovenox  7/15: No Coumadin, no Lovenox  7/16: No Coumadin, no Lovenox  7/17: Inject Lovenox 80 mg in the fatty abdominal tissue at least 2 inches from the belly button daily at 4pm rotate sites. No Coumadin.  7/18:Inject Lovenox in the fatty tissue at 4pm rotate sites . No Coumadin.  7/19: Inject Lovenox in the fatty tissue at 4pm. No Coumadin.  7/20: Inject Lovenox in the fatty tissue at 4pm. No Coumadin.  7/21: No Coumadin and  No Lovenox.  7/22: Procedure Day - No Lovenox - Resume Coumadin in the evening or as directed by doctor (take an extra half tablet with usual dose for 2 days then resume normal dose).  7/23: Resume Lovenox inject in the fatty tissue at 8am and take Coumadin.  7/24: Inject Lovenox in the fatty tissue at 8am and take Coumadin.  7/25: Inject Lovenox in the fatty tissue at 8am and take Coumadin.  7/26: Inject Lovenox in the fatty tissue at 8am and take Coumadin.  7/27: Inject Lovenox in the fatty tissue at 8am and take Coumadin.

## 2018-10-24 NOTE — Telephone Encounter (Signed)
Spoke with Mo RN with [facility] and she was inquiring if pt needed INR checked today and the Lovenox instructions. Advised her that pt does not need an INR done today because she came in to the office yesterday and we did it and gave her Lovenox instructions and faxed to [facility] yesterday and to [facility] earlier today. Also, went over Lovenox instructions day by day with Mo and read them back correctly for day by day administration and Coumadin/Warfarin resumption. She stated that they don't have the pt down for daily visits to administer Lovenox but needs to since it has to be given by [facility] and not the facility, so she would get that taken care of and call back if that was going to be an issue.

## 2018-10-24 NOTE — Telephone Encounter (Signed)
Pt's surgery cancelled and not yet rescheduled will need coumadin lovenox crossover when she does have it.  Will take off pre-op list for now.

## 2018-10-24 NOTE — Telephone Encounter (Signed)
° °  Estill Bamberg from [facility] was calling to have Dr Acie Fredrickson change the orders for the patient's Lovenox injections. The patient is set to get the injections daily at 7 pm beginning 07/17 and continuing through 07/20.  Estill Bamberg states that the facility where the patient lives does not have the ability to do the injections, and the orders need to be changed so that someone from [facility] would be able to come in and do the injections.   [facility] only treats patients from 8-5 daily, so the order would need to state that the patient is to receive the shots no later than 4pm   Orders can be faxed to the office at [phone]

## 2018-10-25 ENCOUNTER — Other Ambulatory Visit: Payer: Self-pay

## 2018-10-25 ENCOUNTER — Non-Acute Institutional Stay: Payer: Medicare Other | Admitting: Licensed Clinical Social Worker

## 2018-10-25 DIAGNOSIS — Z515 Encounter for palliative care: Secondary | ICD-10-CM

## 2018-10-25 NOTE — Telephone Encounter (Signed)
Agree with anticoagulation schedule as outlined by Marcelle Overlie, [facility]

## 2018-10-25 NOTE — Progress Notes (Signed)
COMMUNITY PALLIATIVE CARE SW NOTE  PATIENT NAME: ASENATH BALASH DOB: 1927-06-21 MRN: [id number]  PRIMARY CARE PROVIDER: Mayra Neer, MD  RESPONSIBLE PARTY:  Acct ID - Guarantor Home Phone Work Phone Relationship Acct Type  [id number] MEHREEN, AZIZI* [phone]  Self P/F     [facility], [place], [place],*   Due to the COVID-19 crisis, this virtual check-in visit was done via telephone from my office and it was initiated and consent given by this patient and or family.  PLAN OF CARE and INTERVENTIONS:             1. GOALS OF CARE/ ADVANCE CARE PLANNING:  Goal is for patient's food to heal.  Patient is a DNR. 2. SOCIAL/EMOTIONAL/SPIRITUAL ASSESSMENT/ INTERVENTIONS:  SW conducted a Sales executive visit with patient at the [facility].  She was alert and oriented.  Patient reports having surgery scheduled for her foot next week due to infection.  SW provided active listening and supportive counseling while she discussed the upcoming surgery.  She reports having adjusted to the facility. 3. PATIENT/CAREGIVER EDUCATION/ COPING:  Patient copes by expressing herself. 4. PERSONAL EMERGENCY PLAN:  Per facility protocol. 5. COMMUNITY RESOURCES COORDINATION/ HEALTH CARE NAVIGATION:  None. 6. FINANCIAL/LEGAL CONCERNS/INTERVENTIONS:  None.     SOCIAL HX:  Social History   Tobacco Use  . Smoking status: Former Smoker    Packs/day: 0.50    Years: 40.00    Pack years: 20.00    Types: Cigarettes    Start date: 05/06/1949    Quit date: 03/06/1990    Years since quitting: 28.6  . Smokeless tobacco: Never Used  Substance Use Topics  . Alcohol use: No    Alcohol/week: 0.0 standard drinks    Comment: Remote social EtOH   CODE STATUS:  DNR ADVANCED DIRECTIVES: N MOST FORM COMPLETE:  N HOSPICE EDUCATION PROVIDED:  N PPS:  Patient states her appetite is normal. Duration of visit and documentation:  30 minutes.      Creola Corn Petrice Beedy, LCSW

## 2018-10-26 DIAGNOSIS — I482 Chronic atrial fibrillation, unspecified: Secondary | ICD-10-CM | POA: Diagnosis not present

## 2018-10-26 DIAGNOSIS — I69318 Other symptoms and signs involving cognitive functions following cerebral infarction: Secondary | ICD-10-CM | POA: Diagnosis not present

## 2018-10-26 DIAGNOSIS — Z48817 Encounter for surgical aftercare following surgery on the skin and subcutaneous tissue: Secondary | ICD-10-CM | POA: Diagnosis not present

## 2018-10-26 DIAGNOSIS — I25119 Atherosclerotic heart disease of native coronary artery with unspecified angina pectoris: Secondary | ICD-10-CM | POA: Diagnosis not present

## 2018-10-26 DIAGNOSIS — I130 Hypertensive heart and chronic kidney disease with heart failure and stage 1 through stage 4 chronic kidney disease, or unspecified chronic kidney disease: Secondary | ICD-10-CM | POA: Diagnosis not present

## 2018-10-26 DIAGNOSIS — F0150 Vascular dementia without behavioral disturbance: Secondary | ICD-10-CM | POA: Diagnosis not present

## 2018-10-27 DIAGNOSIS — F0150 Vascular dementia without behavioral disturbance: Secondary | ICD-10-CM | POA: Diagnosis not present

## 2018-10-27 DIAGNOSIS — Z48817 Encounter for surgical aftercare following surgery on the skin and subcutaneous tissue: Secondary | ICD-10-CM | POA: Diagnosis not present

## 2018-10-27 DIAGNOSIS — I482 Chronic atrial fibrillation, unspecified: Secondary | ICD-10-CM | POA: Diagnosis not present

## 2018-10-27 DIAGNOSIS — I130 Hypertensive heart and chronic kidney disease with heart failure and stage 1 through stage 4 chronic kidney disease, or unspecified chronic kidney disease: Secondary | ICD-10-CM | POA: Diagnosis not present

## 2018-10-27 DIAGNOSIS — I25119 Atherosclerotic heart disease of native coronary artery with unspecified angina pectoris: Secondary | ICD-10-CM | POA: Diagnosis not present

## 2018-10-27 DIAGNOSIS — I69318 Other symptoms and signs involving cognitive functions following cerebral infarction: Secondary | ICD-10-CM | POA: Diagnosis not present

## 2018-10-28 DIAGNOSIS — I130 Hypertensive heart and chronic kidney disease with heart failure and stage 1 through stage 4 chronic kidney disease, or unspecified chronic kidney disease: Secondary | ICD-10-CM | POA: Diagnosis not present

## 2018-10-28 DIAGNOSIS — F0150 Vascular dementia without behavioral disturbance: Secondary | ICD-10-CM | POA: Diagnosis not present

## 2018-10-28 DIAGNOSIS — I482 Chronic atrial fibrillation, unspecified: Secondary | ICD-10-CM | POA: Diagnosis not present

## 2018-10-28 DIAGNOSIS — I69318 Other symptoms and signs involving cognitive functions following cerebral infarction: Secondary | ICD-10-CM | POA: Diagnosis not present

## 2018-10-28 DIAGNOSIS — I25119 Atherosclerotic heart disease of native coronary artery with unspecified angina pectoris: Secondary | ICD-10-CM | POA: Diagnosis not present

## 2018-10-28 DIAGNOSIS — Z48817 Encounter for surgical aftercare following surgery on the skin and subcutaneous tissue: Secondary | ICD-10-CM | POA: Diagnosis not present

## 2018-10-29 ENCOUNTER — Other Ambulatory Visit: Payer: Self-pay

## 2018-10-29 ENCOUNTER — Telehealth: Payer: Self-pay | Admitting: Plastic Surgery

## 2018-10-29 ENCOUNTER — Encounter (HOSPITAL_COMMUNITY): Payer: Self-pay | Admitting: *Deleted

## 2018-10-29 DIAGNOSIS — Z48817 Encounter for surgical aftercare following surgery on the skin and subcutaneous tissue: Secondary | ICD-10-CM | POA: Diagnosis not present

## 2018-10-29 DIAGNOSIS — I25119 Atherosclerotic heart disease of native coronary artery with unspecified angina pectoris: Secondary | ICD-10-CM | POA: Diagnosis not present

## 2018-10-29 DIAGNOSIS — I69318 Other symptoms and signs involving cognitive functions following cerebral infarction: Secondary | ICD-10-CM | POA: Diagnosis not present

## 2018-10-29 DIAGNOSIS — F0150 Vascular dementia without behavioral disturbance: Secondary | ICD-10-CM | POA: Diagnosis not present

## 2018-10-29 DIAGNOSIS — I482 Chronic atrial fibrillation, unspecified: Secondary | ICD-10-CM | POA: Diagnosis not present

## 2018-10-29 DIAGNOSIS — I130 Hypertensive heart and chronic kidney disease with heart failure and stage 1 through stage 4 chronic kidney disease, or unspecified chronic kidney disease: Secondary | ICD-10-CM | POA: Diagnosis not present

## 2018-10-29 NOTE — Progress Notes (Addendum)
I spoke with patient and Brittany [name], Med Tech at [facility].  Patient has not had any s/s of covid 19, she will arrive at 1200 to be tested on Wednesday.

## 2018-10-29 NOTE — Telephone Encounter (Signed)
Patient called with questions regarding if she is able to take her regular medications the morning of her surgery on Wednesday. She requested a call back from clinical staff to discuss.

## 2018-10-29 NOTE — Pre-Procedure Instructions (Addendum)
    Brittany [name]  10/29/2018     Your procedure is scheduled on Wednesday, July 22..  Report to [facility], Main Entrance or Entrance "A" at 1200 noon                     Call this number if you have problems the morning of surgery: [phone]  This is the number for the Pre- Surgical Desk.  >>>>>Please send patient's Medication Record with medications administrated documentation. ( this information is required prior to OR. This includes medications that may have been on hold for surgery)<<<<<  Remember:  Do not eat or drink after midnight Tuesday, July 21   Take these medicines the morning of surgery with A SIP OF WATER :              atorvastatin (LIPITOR)              busPIRone (BUSPAR)               cycloSPORINE (RESTASIS) eye drops              diltiazem (CARDIZEM)              REFRESH OP eye drops              If needed use: albuterol (VENTOLIN HFA) inhaler              Take if needed:  acetaminophen (TYLENOL) or HYDROcodone-acetaminophen (NORCO/VICODIN)             Follow Dr instructions on stopping Coumadin and beginning Lovenox            Hold Vitamins              Shower, wear clean clothes, brush teeth.  Do not wear jewelry, make-up or nail polish.  Do not wear lotions, powders, or perfumes, or deodorant.  Do not bring valuables to the hospital.  [facility] is not responsible for any belongings or valuables. Contacts, dentures or bridgework may not be worn into surgery  Orders per Anesthesiology Consultant [facility]

## 2018-10-29 NOTE — Telephone Encounter (Signed)
Dr. Marla Roe called and spoke with the patient regarding her medications.//AB/CMA

## 2018-10-30 DIAGNOSIS — I482 Chronic atrial fibrillation, unspecified: Secondary | ICD-10-CM | POA: Diagnosis not present

## 2018-10-30 DIAGNOSIS — F0150 Vascular dementia without behavioral disturbance: Secondary | ICD-10-CM | POA: Diagnosis not present

## 2018-10-30 DIAGNOSIS — I130 Hypertensive heart and chronic kidney disease with heart failure and stage 1 through stage 4 chronic kidney disease, or unspecified chronic kidney disease: Secondary | ICD-10-CM | POA: Diagnosis not present

## 2018-10-30 DIAGNOSIS — I25119 Atherosclerotic heart disease of native coronary artery with unspecified angina pectoris: Secondary | ICD-10-CM | POA: Diagnosis not present

## 2018-10-30 DIAGNOSIS — F411 Generalized anxiety disorder: Secondary | ICD-10-CM | POA: Diagnosis not present

## 2018-10-30 DIAGNOSIS — I69318 Other symptoms and signs involving cognitive functions following cerebral infarction: Secondary | ICD-10-CM | POA: Diagnosis not present

## 2018-10-30 DIAGNOSIS — Z48817 Encounter for surgical aftercare following surgery on the skin and subcutaneous tissue: Secondary | ICD-10-CM | POA: Diagnosis not present

## 2018-10-31 ENCOUNTER — Encounter (HOSPITAL_COMMUNITY)
Admission: RE | Disposition: A | Discharge status: Home / Self Care | Payer: Self-pay | Source: Ambulatory Visit | Attending: Plastic Surgery

## 2018-10-31 ENCOUNTER — Ambulatory Visit (HOSPITAL_COMMUNITY): Payer: Medicare Other | Admitting: Anesthesiology

## 2018-10-31 ENCOUNTER — Ambulatory Visit (HOSPITAL_COMMUNITY)
Admission: RE | Admit: 2018-10-31 | Discharge: 2018-10-31 | Disposition: A | Discharge status: Home / Self Care | Payer: Medicare Other | Source: Ambulatory Visit | Attending: Plastic Surgery | Admitting: Plastic Surgery

## 2018-10-31 ENCOUNTER — Encounter (HOSPITAL_COMMUNITY): Payer: Self-pay

## 2018-10-31 ENCOUNTER — Other Ambulatory Visit: Payer: Self-pay

## 2018-10-31 DIAGNOSIS — Z1159 Encounter for screening for other viral diseases: Secondary | ICD-10-CM | POA: Diagnosis not present

## 2018-10-31 DIAGNOSIS — S91302A Unspecified open wound, left foot, initial encounter: Secondary | ICD-10-CM | POA: Diagnosis not present

## 2018-10-31 DIAGNOSIS — Z955 Presence of coronary angioplasty implant and graft: Secondary | ICD-10-CM | POA: Insufficient documentation

## 2018-10-31 DIAGNOSIS — Z87891 Personal history of nicotine dependence: Secondary | ICD-10-CM | POA: Diagnosis not present

## 2018-10-31 DIAGNOSIS — I129 Hypertensive chronic kidney disease with stage 1 through stage 4 chronic kidney disease, or unspecified chronic kidney disease: Secondary | ICD-10-CM | POA: Diagnosis not present

## 2018-10-31 DIAGNOSIS — I4891 Unspecified atrial fibrillation: Secondary | ICD-10-CM | POA: Insufficient documentation

## 2018-10-31 DIAGNOSIS — I509 Heart failure, unspecified: Secondary | ICD-10-CM | POA: Insufficient documentation

## 2018-10-31 DIAGNOSIS — X58XXXA Exposure to other specified factors, initial encounter: Secondary | ICD-10-CM | POA: Insufficient documentation

## 2018-10-31 DIAGNOSIS — K219 Gastro-esophageal reflux disease without esophagitis: Secondary | ICD-10-CM | POA: Diagnosis not present

## 2018-10-31 DIAGNOSIS — Z7901 Long term (current) use of anticoagulants: Secondary | ICD-10-CM | POA: Diagnosis not present

## 2018-10-31 DIAGNOSIS — Z8673 Personal history of transient ischemic attack (TIA), and cerebral infarction without residual deficits: Secondary | ICD-10-CM | POA: Diagnosis not present

## 2018-10-31 DIAGNOSIS — I2510 Atherosclerotic heart disease of native coronary artery without angina pectoris: Secondary | ICD-10-CM | POA: Insufficient documentation

## 2018-10-31 DIAGNOSIS — F0390 Unspecified dementia without behavioral disturbance: Secondary | ICD-10-CM | POA: Diagnosis not present

## 2018-10-31 DIAGNOSIS — J449 Chronic obstructive pulmonary disease, unspecified: Secondary | ICD-10-CM | POA: Insufficient documentation

## 2018-10-31 DIAGNOSIS — I252 Old myocardial infarction: Secondary | ICD-10-CM | POA: Diagnosis not present

## 2018-10-31 DIAGNOSIS — N183 Chronic kidney disease, stage 3 (moderate): Secondary | ICD-10-CM | POA: Diagnosis not present

## 2018-10-31 DIAGNOSIS — I110 Hypertensive heart disease with heart failure: Secondary | ICD-10-CM | POA: Diagnosis not present

## 2018-10-31 HISTORY — PX: INCISION AND DRAINAGE OF WOUND: SHX1803

## 2018-10-31 HISTORY — PX: APPLICATION OF A-CELL OF EXTREMITY: SHX6303

## 2018-10-31 HISTORY — DX: Presence of dental prosthetic device (complete) (partial): Z97.2

## 2018-10-31 HISTORY — DX: Unspecified visual loss: H54.7

## 2018-10-31 HISTORY — DX: Presence of spectacles and contact lenses: Z97.3

## 2018-10-31 LAB — COMPREHENSIVE METABOLIC PANEL
ALT: 26 U/L (ref 0–44)
AST: 46 U/L — ABNORMAL HIGH (ref 15–41)
Albumin: 3.6 g/dL (ref 3.5–5.0)
Alkaline Phosphatase: 104 U/L (ref 38–126)
Anion gap: 10 (ref 5–15)
BUN: 11 mg/dL (ref 8–23)
CO2: 26 mmol/L (ref 22–32)
Calcium: 9.3 mg/dL (ref 8.9–10.3)
Chloride: 92 mmol/L — ABNORMAL LOW (ref 98–111)
Creatinine, Ser: 0.77 mg/dL (ref 0.44–1.00)
GFR calc Af Amer: >60 mL/min (ref >60)
GFR calc non Af Amer: >60 mL/min (ref >60)
Glucose, Bld: 119 mg/dL — ABNORMAL HIGH (ref 70–99)
Potassium: 4.3 mmol/L (ref 3.5–5.1)
Sodium: 128 mmol/L — ABNORMAL LOW (ref 135–145)
Total Bilirubin: 1 mg/dL (ref 0.3–1.2)
Total Protein: 8.1 g/dL (ref 6.5–8.1)

## 2018-10-31 LAB — PROTIME-INR
INR: 1.1 (ref 0.8–1.2)
Prothrombin Time: 13.6 seconds (ref 11.4–15.2)

## 2018-10-31 LAB — CBC
HCT: 41.2 % (ref 36.0–46.0)
Hemoglobin: 13.4 g/dL (ref 12.0–15.0)
MCH: 26.5 pg (ref 26.0–34.0)
MCHC: 32.5 g/dL (ref 30.0–36.0)
MCV: 81.6 fL (ref 80.0–100.0)
Platelets: 263 10*3/uL (ref 150–400)
RBC: 5.05 MIL/uL (ref 3.87–5.11)
RDW: 16.7 % — ABNORMAL HIGH (ref 11.5–15.5)
WBC: 7.1 10*3/uL (ref 4.0–10.5)
nRBC: 0 % (ref 0.0–0.2)

## 2018-10-31 LAB — SARS CORONAVIRUS 2 BY RT PCR (HOSPITAL ORDER, PERFORMED IN ~~LOC~~ HOSPITAL LAB): SARS Coronavirus 2: NEGATIVE

## 2018-10-31 SURGERY — IRRIGATION AND DEBRIDEMENT WOUND
Anesthesia: General | Site: Foot | Laterality: Left

## 2018-10-31 MED ORDER — DEXAMETHASONE SODIUM PHOSPHATE 10 MG/ML IJ SOLN
INTRAMUSCULAR | Status: DC | PRN
  Administered 2018-10-31: 5 mg via INTRAVENOUS

## 2018-10-31 MED ORDER — FENTANYL CITRATE (PF) 250 MCG/5ML IJ SOLN
INTRAMUSCULAR | Status: AC
  Filled 2018-10-31: qty 5

## 2018-10-31 MED ORDER — BUPIVACAINE-EPINEPHRINE (PF) 0.25% -1:200000 IJ SOLN
INTRAMUSCULAR | Status: DC | PRN
  Administered 2018-10-31: 9 mL via PERINEURAL

## 2018-10-31 MED ORDER — CLINDAMYCIN PHOSPHATE 900 MG/50ML IV SOLN
900.0000 mg | INTRAVENOUS | Status: AC
  Administered 2018-10-31: 900 mg via INTRAVENOUS
  Filled 2018-10-31: qty 50

## 2018-10-31 MED ORDER — BUPIVACAINE-EPINEPHRINE (PF) 0.25% -1:200000 IJ SOLN
INTRAMUSCULAR | Status: AC
  Filled 2018-10-31: qty 30

## 2018-10-31 MED ORDER — SODIUM CHLORIDE 0.9 % IV SOLN
INTRAVENOUS | Status: AC
  Filled 2018-10-31: qty 500000

## 2018-10-31 MED ORDER — ACETAMINOPHEN 650 MG RE SUPP: 650.0000 mg | RECTAL | Status: DC | PRN

## 2018-10-31 MED ORDER — SODIUM CHLORIDE 0.9 % IV SOLN: 250.0000 mL | INTRAVENOUS | Status: DC | PRN

## 2018-10-31 MED ORDER — ONDANSETRON HCL 4 MG/2ML IJ SOLN: 4.0000 mg | Freq: Once | INTRAMUSCULAR | Status: DC | PRN

## 2018-10-31 MED ORDER — 0.9 % SODIUM CHLORIDE (POUR BTL) OPTIME
TOPICAL | Status: DC | PRN
  Administered 2018-10-31: 1000 mL

## 2018-10-31 MED ORDER — SODIUM CHLORIDE 0.9% FLUSH: 3.0000 mL | INTRAVENOUS | Status: DC | PRN

## 2018-10-31 MED ORDER — LIDOCAINE-EPINEPHRINE 1 %-1:100000 IJ SOLN
INTRAMUSCULAR | Status: AC
  Filled 2018-10-31: qty 1

## 2018-10-31 MED ORDER — FENTANYL CITRATE (PF) 100 MCG/2ML IJ SOLN: 25.0000 ug | INTRAMUSCULAR | Status: DC | PRN

## 2018-10-31 MED ORDER — LACTATED RINGERS IV SOLN
INTRAVENOUS | Status: DC
  Administered 2018-10-31: 14:00:00 via INTRAVENOUS

## 2018-10-31 MED ORDER — PHENYLEPHRINE 40 MCG/ML (10ML) SYRINGE FOR IV PUSH (FOR BLOOD PRESSURE SUPPORT)
PREFILLED_SYRINGE | INTRAVENOUS | Status: DC | PRN
  Administered 2018-10-31: 40 ug via INTRAVENOUS
  Administered 2018-10-31 (×3): 80 ug via INTRAVENOUS

## 2018-10-31 MED ORDER — EPHEDRINE SULFATE-NACL 50-0.9 MG/10ML-% IV SOSY
PREFILLED_SYRINGE | INTRAVENOUS | Status: DC | PRN
  Administered 2018-10-31 (×2): 10 mg via INTRAVENOUS

## 2018-10-31 MED ORDER — LIDOCAINE 2% (20 MG/ML) 5 ML SYRINGE
INTRAMUSCULAR | Status: DC | PRN
  Administered 2018-10-31: 100 mg via INTRAVENOUS

## 2018-10-31 MED ORDER — ONDANSETRON HCL 4 MG/2ML IJ SOLN
INTRAMUSCULAR | Status: DC | PRN
  Administered 2018-10-31: 4 mg via INTRAVENOUS

## 2018-10-31 MED ORDER — ACETAMINOPHEN 325 MG PO TABS: 650.0000 mg | ORAL_TABLET | ORAL | Status: DC | PRN

## 2018-10-31 MED ORDER — FENTANYL CITRATE (PF) 100 MCG/2ML IJ SOLN
INTRAMUSCULAR | Status: DC | PRN
  Administered 2018-10-31: 25 ug via INTRAVENOUS

## 2018-10-31 MED ORDER — CHLORHEXIDINE GLUCONATE 4 % EX LIQD: 1.0000 "application " | Freq: Once | CUTANEOUS | Status: DC

## 2018-10-31 MED ORDER — SODIUM CHLORIDE 0.9% FLUSH: 3.0000 mL | Freq: Two times a day (BID) | INTRAVENOUS | Status: DC

## 2018-10-31 MED ORDER — PROPOFOL 10 MG/ML IV BOLUS
INTRAVENOUS | Status: DC | PRN
  Administered 2018-10-31: 100 mg via INTRAVENOUS

## 2018-10-31 MED ORDER — MEPERIDINE HCL 25 MG/ML IJ SOLN: 6.2500 mg | INTRAMUSCULAR | Status: DC | PRN

## 2018-10-31 SURGICAL SUPPLY — 60 items
APPLICATOR COTTON TIP 6 STRL (MISCELLANEOUS) IMPLANT
APPLICATOR COTTON TIP 6IN STRL (MISCELLANEOUS)
BAG DECANTER FOR FLEXI CONT (MISCELLANEOUS) ×2 IMPLANT
BENZOIN TINCTURE PRP APPL 2/3 (GAUZE/BANDAGES/DRESSINGS) ×1 IMPLANT
BNDG ELASTIC 4X5.8 VLCR STR LF (GAUZE/BANDAGES/DRESSINGS) ×4 IMPLANT
BNDG GAUZE ELAST 4 BULKY (GAUZE/BANDAGES/DRESSINGS) ×4 IMPLANT
CANISTER SUCT 3000ML PPV (MISCELLANEOUS) ×3 IMPLANT
CANISTER WOUND CARE 500ML ATS (WOUND CARE) ×3 IMPLANT
CONT SPEC 4OZ CLIKSEAL STRL BL (MISCELLANEOUS) IMPLANT
COVER SURGICAL LIGHT HANDLE (MISCELLANEOUS) ×3 IMPLANT
COVER WAND RF STERILE (DRAPES) ×3 IMPLANT
DRAPE HALF SHEET 40X57 (DRAPES) ×2 IMPLANT
DRAPE IMP U-DRAPE 54X76 (DRAPES) ×3 IMPLANT
DRAPE INCISE IOBAN 66X45 STRL (DRAPES) IMPLANT
DRAPE LAPAROSCOPIC ABDOMINAL (DRAPES) IMPLANT
DRAPE LAPAROTOMY 100X72 PEDS (DRAPES) ×1 IMPLANT
DRESSING HYDROCOLLOID 4X4 (GAUZE/BANDAGES/DRESSINGS) ×3 IMPLANT
DRSG ADAPTIC 3X8 NADH LF (GAUZE/BANDAGES/DRESSINGS) IMPLANT
DRSG CUTIMED SORBACT 7X9 (GAUZE/BANDAGES/DRESSINGS) ×3 IMPLANT
DRSG PAD ABDOMINAL 8X10 ST (GAUZE/BANDAGES/DRESSINGS) IMPLANT
DRSG VAC ATS LRG SENSATRAC (GAUZE/BANDAGES/DRESSINGS) IMPLANT
DRSG VAC ATS MED SENSATRAC (GAUZE/BANDAGES/DRESSINGS) IMPLANT
DRSG VAC ATS SM SENSATRAC (GAUZE/BANDAGES/DRESSINGS) IMPLANT
ELECT CAUTERY BLADE 6.4 (BLADE) ×3 IMPLANT
ELECT REM PT RETURN 9FT ADLT (ELECTROSURGICAL) ×3
ELECTRODE REM PT RTRN 9FT ADLT (ELECTROSURGICAL) ×1 IMPLANT
GAUZE SPONGE 4X4 12PLY STRL (GAUZE/BANDAGES/DRESSINGS) ×3 IMPLANT
GEL ULTRASOUND 20GR AQUASONIC (MISCELLANEOUS) IMPLANT
GLOVE BIO SURGEON STRL SZ 6.5 (GLOVE) ×4 IMPLANT
GLOVE BIO SURGEONS STRL SZ 6.5 (GLOVE) ×2
GOWN STRL REUS W/ TWL LRG LVL3 (GOWN DISPOSABLE) ×3 IMPLANT
GOWN STRL REUS W/TWL LRG LVL3 (GOWN DISPOSABLE) ×6
KIT BASIN OR (CUSTOM PROCEDURE TRAY) ×3 IMPLANT
KIT TURNOVER KIT B (KITS) ×3 IMPLANT
MATRIX WOUND 3-LAYER 5X5 (Tissue) ×1 IMPLANT
MICROMATRIX 500MG (Tissue) ×3 IMPLANT
NDL HYPO 25GX1X1/2 BEV (NEEDLE) ×1 IMPLANT
NEEDLE HYPO 25GX1X1/2 BEV (NEEDLE) ×3 IMPLANT
NS IRRIG 1000ML POUR BTL (IV SOLUTION) ×3 IMPLANT
PACK GENERAL/GYN (CUSTOM PROCEDURE TRAY) ×3 IMPLANT
PACK UNIVERSAL I (CUSTOM PROCEDURE TRAY) ×3 IMPLANT
PAD ARMBOARD 7.5X6 YLW CONV (MISCELLANEOUS) ×6 IMPLANT
SOLUTION PARTIC MCRMTRX 500MG (Tissue) IMPLANT
STAPLER VISISTAT 35W (STAPLE) ×3 IMPLANT
STOCKINETTE IMPERVIOUS 9X36 MD (GAUZE/BANDAGES/DRESSINGS) IMPLANT
STOCKINETTE IMPERVIOUS LG (DRAPES) IMPLANT
SURGILUBE 2OZ TUBE FLIPTOP (MISCELLANEOUS) ×5 IMPLANT
SUT MNCRL AB 4-0 PS2 18 (SUTURE) IMPLANT
SUT SILK 4 0 P 3 (SUTURE) IMPLANT
SUT VIC AB 5-0 PS2 18 (SUTURE) ×7 IMPLANT
SWAB COLLECTION DEVICE MRSA (MISCELLANEOUS) IMPLANT
SWAB CULTURE ESWAB REG 1ML (MISCELLANEOUS) IMPLANT
SYR CONTROL 10ML LL (SYRINGE) ×3 IMPLANT
TOWEL GREEN STERILE (TOWEL DISPOSABLE) ×3 IMPLANT
TOWEL GREEN STERILE FF (TOWEL DISPOSABLE) IMPLANT
TUBE CONNECTING 12'X1/4 (SUCTIONS) ×1
TUBE CONNECTING 12X1/4 (SUCTIONS) ×2 IMPLANT
UNDERPAD 30X30 (UNDERPADS AND DIAPERS) ×3 IMPLANT
WOUND MATRIX 3-LAYER 5X5 (Tissue) ×1 IMPLANT
YANKAUER SUCT BULB TIP NO VENT (SUCTIONS) ×3 IMPLANT

## 2018-10-31 NOTE — Anesthesia Procedure Notes (Signed)
Procedure Name: LMA Insertion Date/Time: 10/31/2018 2:38 PM Performed by: Marieann Zipp T, CRNA Pre-anesthesia Checklist: Patient identified, Emergency Drugs available, Suction available and Patient being monitored Patient Re-evaluated:Patient Re-evaluated prior to induction Oxygen Delivery Method: Circle system utilized Preoxygenation: Pre-oxygenation with 100% oxygen Induction Type: IV induction LMA: LMA inserted LMA Size: 4.0 Number of attempts: 1 Airway Equipment and Method: Patient positioned with wedge pillow Placement Confirmation: positive ETCO2 and breath sounds checked- equal and bilateral Tube secured with: Tape Dental Injury: Teeth and Oropharynx as per pre-operative assessment

## 2018-10-31 NOTE — Discharge Instructions (Signed)
Guide to Wound Care  Proper wound care may reduce the risk of infection, improve healing rates, and limit scarring.  This is a general guide to help care for and manage wounds treated with MicroMatrix or Cytal Wound Matrix.   Dressing Changes The frequency of dressing changes can vary based on which product was applied, the size of the wound, or the amount of wound drainage. Dressing inspections are recommended, at least weekly.   If you have a Wound VAC it will be changed in one week after the first time it is applied.  Then it will be changed once or twice a week.   If you don't have a Wound VAC, then place KY gel on the wound daily and cover with gauze.  Dressing Types Primary Dressing:  Non-adherent dressing goes directly over wounds being treated with the powder or sheet (MicroMatrix and/or Cytal).  Secondary Dressing:  Secures the primary dressing in place and provides extra protection, compression, and absorption.  1. Wash Hands - To help decrease the risk of infection, caregivers should wash their hands for a minimum of 20 seconds and may use medical gloves.   2. Remove the Dressings - Avoid removing product from the wound by carefully removing the applicable dressing(s) at the time points recommended above, or as recommended by the treating physician.  Expected Color and Odor:  It is entirely normal for the wound to have an unpleasant odor and to form a caramel-colored gel as the product absorbs into the wound. It is  important to leave this gel on the wound site.  3. Clean the Wound - Use clean water or saline to gently rinse the wound surface and remove any excess discharge that may be present on the wound. Do not wipe off any of the caramel-colored gel on the wound. Protocol for cleaning the wound may vary by facility. If you are unsure what to do, ask the treating physician.  4. Inspect the Wound - Proper wound care requires accurate and clinically relevant wound assessment.  Protocols for inspecting the wound may vary by facility, but it is important to  [id number] the length and width.  What to look out for:  Large or increased amount of drainage   Surrounding skin is red or hot to touch   Increased pain in or around the wound   Flu-like symptoms, fatigue, decreased appetite, fever   Hard, crusty wound surface with black or brown coloring  5. Apply New Dressings - Dressings should cover the entire wound and be suitable for maintaining a moist wound environment. Refer to the back of this guide for more information.  Maintain a Hydrated Wound Area While hydration protocols may vary by facility, it is important to keep the wound area moist throughout the healing process. If the wound appears to be dry during dressing changes, select a dressing that will hydrate the wound and maintain that ideal moist environment. If you are unsure what to do, ask the treating physician.  Remodeling Process Every patient heals differently, and no two cases are the same. The size and location of the wound, product type and layering configurations, and general patient health all contribute to how quickly a wound will heal.  While many factors can influence the rate at which the product absorbs, the following can be used as a general guide.   THINGS TO DO: Refrain from smoking High protein diet and limit carbohydrates and suger Protect the wound from trauma Protect the dressing  Micromatrix powder  Cytal Sheet            Sorbact dressing ° ° °

## 2018-10-31 NOTE — Transfer of Care (Signed)
Immediate Anesthesia Transfer of Care Note  Patient: Brittany [name]  Procedure(s) Performed: DEBRIDEMENT LEFT FOOT WOUND (Left Foot) APPLICATION OF A-CELL OF EXTREMITY (Left Foot)  Patient Location: PACU  Anesthesia Type:General  Level of Consciousness: awake, alert  and oriented  Airway & Oxygen Therapy: Patient Spontanous Breathing  Post-op Assessment: Report given to RN, Post -op Vital signs reviewed and stable and Patient moving all extremities  Post vital signs: Reviewed and stable  Last Vitals:  Vitals Value Taken Time  BP 150/70 10/31/18 1528  Temp    Pulse 79 10/31/18 1530  Resp 23 10/31/18 1530  SpO2 100 % 10/31/18 1530  Vitals shown include unvalidated device data.  Last Pain:  Vitals:   10/31/18 1242  TempSrc:   PainSc: 0-No pain      Patients Stated Pain Goal: 3 (80/22/17 9810)  Complications: No apparent anesthesia complications

## 2018-10-31 NOTE — Interval H&P Note (Signed)
History and Physical Interval Note:  10/31/2018 1:47 PM  Brittany [name]  has presented today for surgery, with the diagnosis of OPEN WOULD ON LEFT FOOT.  The various methods of treatment have been discussed with the patient and family. After consideration of risks, benefits and other options for treatment, the patient has consented to  Procedure(s): DEBRIDEMENT LEFT FOOT WOUND (Left) APPLICATION OF A-CELL OF EXTREMITY (Left) as a surgical intervention.  The patient's history has been reviewed, patient examined, no change in status, stable for surgery.  I have reviewed the patient's chart and labs.  Questions were answered to the patient's satisfaction.     Loel Lofty Gussie Towson

## 2018-10-31 NOTE — Op Note (Addendum)
DATE OF OPERATION: 10/31/2018  LOCATION: [facility]  PREOPERATIVE DIAGNOSIS: left foot wound  POSTOPERATIVE DIAGNOSIS: Same  PROCEDURE:  1. Excision of left foot wound 2.5 x 3 cm skin, soft tissue, tendon and bone 2. Placement of Acell (5 x 5 cm sheet and 1 gm powder) 3. Bone sent from 5th metatarsal for micro and pathology 5 mm  SURGEON: Xzayvion Vaeth Sanger Karli Wickizer, DO  ASSISTANT: Roetta Sessions, PA  EBL: 5 cc  CONDITION: Stable  COMPLICATIONS: None  INDICATION: The patient, Brittany [name], is a 85 y.o. female born on 05-Apr-1928, is here for treatment of a chronic left foot wound.   PROCEDURE DETAILS:  The patient was seen prior to surgery and marked.  The IV antibiotics were given. The patient was taken to the operating room and given a general anesthetic. A standard time out was performed and all information was confirmed by those in the room. SCD was placed on the right leg.   The local was injected for intraoperative hemostasis and post operative pain control.  The #10 blade was used to excise the nonviable skin and soft tissue of the 2.5 x 3 cm wound. The ronguer was used to excise 5 mm of bone that was noted on the 5 th metatarsal of the foot that appeared to be nonviable.  A specimen 5 mm was sent for micro and path. Hemostasis was achieved with pressure. All of the acell powder and sheet (layered) was applied and secured to the skin with the 5-0 Vicryl.  The sorbact was placed and secured with the vicryl.  The KY gel, gauze and kerlex were applied.    The patient was allowed to wake up and taken to recovery room in stable condition at the end of the case. The family was notified at the end of the case.   The advanced practice practitioner (APP) assisted throughout the case.  The APP was essential in retraction and counter traction when needed to make the case progress smoothly.  This retraction and assistance made it possible to see the tissue plans for the  procedure.  The assistance was needed for blood control, tissue re-approximation and assisted with closure of the incision site.

## 2018-10-31 NOTE — Anesthesia Preprocedure Evaluation (Signed)
Anesthesia Evaluation  Patient identified by MRN, date of birth, ID band  Reviewed: Allergy & Precautions, NPO status , Patient's Chart, lab work & pertinent test results  History of Anesthesia Complications Negative for: history of anesthetic complications  Airway Mallampati: II  TM Distance: >3 FB Neck ROM: Full    Dental  (+) Dental Advisory Given, Upper Dentures, Lower Dentures   Pulmonary shortness of breath, COPD, former smoker,    breath sounds clear to auscultation       Cardiovascular hypertension, Pt. on medications + CAD, + Past MI, + Cardiac Stents and +CHF  + dysrhythmias Atrial Fibrillation + Valvular Problems/Murmurs  Rhythm:Regular + Systolic murmurs    Neuro/Psych  Headaches, PSYCHIATRIC DISORDERS Dementia CVA, Residual Symptoms    GI/Hepatic hiatal hernia, GERD  ,  Endo/Other    Renal/GU CRFRenal disease     Musculoskeletal  (+) Arthritis ,   Abdominal   Peds  Hematology  (+) Blood dyscrasia, anemia ,   Anesthesia Other Findings  1. The left ventricle has normal systolic function, with an ejection fraction of 60-65%. The cavity size was normal.  2. The right ventricle has normal systolc function. The cavity was normal. There is no increase in right ventricular wall thickness.  3. Left atrial size was severely dilated.  4. Right atrial size was severely dilated.  5. Moderate mitral regurgitation, PISA 0.7, ERO 0.15, no PV flow reversal.  6. Tricuspid valve regurgitation is moderate-severe.  7. A an Edwards bioprosthesis valve is present in the aortic position. Procedure Date: 2005 Echo findings are consistent with thickening of the aortic prosthesis.  8. Pulmonic valve regurgitation was not assessed by color flow Doppler.  9. There is evidence of moderate plaque in the descending aorta.  Reproductive/Obstetrics                             Anesthesia Physical  Anesthesia  Plan  ASA: III  Anesthesia Plan: General   Post-op Pain Management:    Induction: Intravenous  PONV Risk Score and Plan: 3 and Ondansetron and Dexamethasone  Airway Management Planned: LMA  Additional Equipment: None  Intra-op Plan:   Post-operative Plan: Extubation in OR  Informed Consent: I have reviewed the patients History and Physical, chart, labs and discussed the procedure including the risks, benefits and alternatives for the proposed anesthesia with the patient or authorized representative who has indicated his/her understanding and acceptance.     Dental advisory given  Plan Discussed with: CRNA and Surgeon  Anesthesia Plan Comments: (Follows with cardiology for hx of AVR (Edwards tissue valve 2005), CAD s/p DES to LAD 2015, prior stroke with right visual field defect, and chronic atrial fibrillation on warfarin. Recently Hospitalized 3/14-3/17 for shortness of breath, CAP and volume overload. Diuresed. Renal function and K+ good at discharge. EF >65% on TTE and 60-65% on TEE with well seated tissue AVR with no vegetations. Seen by cardiology 07/13/2018 and per OV note "Patient Risk:  After full review of this patient's clinical status, I feel that they are at least moderate risk at this time." Cleared for surgery in telephone encounter 09/05/18 that also stated warfarin did not need to be held for skin procedure.  Pt is now on palliative care with goal of remaining at home with daughter and avoiding hospitalizations.)        Anesthesia Quick Evaluation

## 2018-11-01 ENCOUNTER — Encounter (HOSPITAL_COMMUNITY): Payer: Self-pay | Admitting: Plastic Surgery

## 2018-11-01 ENCOUNTER — Telehealth: Payer: Self-pay | Admitting: Plastic Surgery

## 2018-11-01 DIAGNOSIS — I130 Hypertensive heart and chronic kidney disease with heart failure and stage 1 through stage 4 chronic kidney disease, or unspecified chronic kidney disease: Secondary | ICD-10-CM | POA: Diagnosis not present

## 2018-11-01 DIAGNOSIS — Z48817 Encounter for surgical aftercare following surgery on the skin and subcutaneous tissue: Secondary | ICD-10-CM | POA: Diagnosis not present

## 2018-11-01 DIAGNOSIS — I69318 Other symptoms and signs involving cognitive functions following cerebral infarction: Secondary | ICD-10-CM | POA: Diagnosis not present

## 2018-11-01 DIAGNOSIS — I482 Chronic atrial fibrillation, unspecified: Secondary | ICD-10-CM | POA: Diagnosis not present

## 2018-11-01 DIAGNOSIS — I25119 Atherosclerotic heart disease of native coronary artery with unspecified angina pectoris: Secondary | ICD-10-CM | POA: Diagnosis not present

## 2018-11-01 DIAGNOSIS — F0150 Vascular dementia without behavioral disturbance: Secondary | ICD-10-CM | POA: Diagnosis not present

## 2018-11-01 NOTE — Anesthesia Postprocedure Evaluation (Signed)
Anesthesia Post Note  Patient: Brittany [name]  Procedure(s) Performed: DEBRIDEMENT LEFT FOOT WOUND (Left Foot) APPLICATION OF A-CELL OF EXTREMITY (Left Foot)     Patient location during evaluation: PACU Anesthesia Type: General Level of consciousness: sedated and patient cooperative Pain management: pain level controlled Vital Signs Assessment: post-procedure vital signs reviewed and stable Respiratory status: spontaneous breathing Cardiovascular status: stable Anesthetic complications: no    Last Vitals:  Vitals:   10/31/18 1530 10/31/18 1545  BP: (!) 150/70 (!) 154/62  Pulse: 73 70  Resp: 12 12  Temp: (!) 36.4 C   SpO2: 99% 97%    Last Pain:  Vitals:   10/31/18 1530  TempSrc:   PainSc: 0-No pain                 Nolon Nations

## 2018-11-01 NOTE — Telephone Encounter (Signed)
Received call from Mo with [facility]. She would like updated orders for Ms. Dedeaux due to her surgery yesterday. Best call back number is [phone]

## 2018-11-01 NOTE — Telephone Encounter (Addendum)
Called and spoke with Mo with [facility] regarding the message below.  She stated that the patient was told that the dress is not to be changed until Friday-11/02/18).  She was able to speak with another nurse who informed her of the orders for the  dressing changes.  I informed her that I spoke with Dr. Marla Roe and she stated [facility] dressing once daily.  Mo verbalized understanding and stated that  with this type of wound changes they are not able to go and do daily, and she was told that Dr. Marla Roe was aware of this.  She stated that she will be seeing the patient on (M,W,F).  I asked if there will be someone at the facility that can help with the dressing changes on the days she's not coming, and she stated that she believes they can help with basic changes.  Mo stated that I could fax a order to the Resident Director for the dressing changes.  She gave me the phone and fax number:([phone]).//AB/CMA

## 2018-11-02 DIAGNOSIS — I69318 Other symptoms and signs involving cognitive functions following cerebral infarction: Secondary | ICD-10-CM | POA: Diagnosis not present

## 2018-11-02 DIAGNOSIS — Z48817 Encounter for surgical aftercare following surgery on the skin and subcutaneous tissue: Secondary | ICD-10-CM | POA: Diagnosis not present

## 2018-11-02 DIAGNOSIS — I130 Hypertensive heart and chronic kidney disease with heart failure and stage 1 through stage 4 chronic kidney disease, or unspecified chronic kidney disease: Secondary | ICD-10-CM | POA: Diagnosis not present

## 2018-11-02 DIAGNOSIS — F0150 Vascular dementia without behavioral disturbance: Secondary | ICD-10-CM | POA: Diagnosis not present

## 2018-11-02 DIAGNOSIS — I482 Chronic atrial fibrillation, unspecified: Secondary | ICD-10-CM | POA: Diagnosis not present

## 2018-11-02 DIAGNOSIS — I25119 Atherosclerotic heart disease of native coronary artery with unspecified angina pectoris: Secondary | ICD-10-CM | POA: Diagnosis not present

## 2018-11-03 ENCOUNTER — Other Ambulatory Visit
Admission: RE | Admit: 2018-11-03 | Discharge: 2018-11-03 | Disposition: A | Discharge status: Home / Self Care | Payer: Medicare Other | Source: Ambulatory Visit

## 2018-11-03 DIAGNOSIS — F0150 Vascular dementia without behavioral disturbance: Secondary | ICD-10-CM | POA: Diagnosis not present

## 2018-11-03 DIAGNOSIS — N390 Urinary tract infection, site not specified: Secondary | ICD-10-CM | POA: Diagnosis not present

## 2018-11-03 DIAGNOSIS — I482 Chronic atrial fibrillation, unspecified: Secondary | ICD-10-CM | POA: Diagnosis not present

## 2018-11-03 DIAGNOSIS — I69318 Other symptoms and signs involving cognitive functions following cerebral infarction: Secondary | ICD-10-CM | POA: Diagnosis not present

## 2018-11-03 DIAGNOSIS — I130 Hypertensive heart and chronic kidney disease with heart failure and stage 1 through stage 4 chronic kidney disease, or unspecified chronic kidney disease: Secondary | ICD-10-CM | POA: Diagnosis not present

## 2018-11-03 DIAGNOSIS — Z48817 Encounter for surgical aftercare following surgery on the skin and subcutaneous tissue: Secondary | ICD-10-CM | POA: Diagnosis not present

## 2018-11-03 DIAGNOSIS — I25119 Atherosclerotic heart disease of native coronary artery with unspecified angina pectoris: Secondary | ICD-10-CM | POA: Diagnosis not present

## 2018-11-03 LAB — URINALYSIS, COMPLETE (UACMP) WITH MICROSCOPIC
Bacteria, UA: NONE SEEN
Bilirubin Urine: NEGATIVE
Glucose, UA: NEGATIVE mg/dL
Hgb urine dipstick: NEGATIVE
Ketones, ur: NEGATIVE mg/dL
Leukocytes,Ua: NEGATIVE
Nitrite: NEGATIVE
Protein, ur: NEGATIVE mg/dL
Specific Gravity, Urine: 1.004 — ABNORMAL LOW (ref 1.005–1.030)
pH: 7 (ref 5.0–8.0)

## 2018-11-04 DIAGNOSIS — I69318 Other symptoms and signs involving cognitive functions following cerebral infarction: Secondary | ICD-10-CM | POA: Diagnosis not present

## 2018-11-04 DIAGNOSIS — I482 Chronic atrial fibrillation, unspecified: Secondary | ICD-10-CM | POA: Diagnosis not present

## 2018-11-04 DIAGNOSIS — I5032 Chronic diastolic (congestive) heart failure: Secondary | ICD-10-CM | POA: Diagnosis not present

## 2018-11-04 DIAGNOSIS — J9611 Chronic respiratory failure with hypoxia: Secondary | ICD-10-CM | POA: Diagnosis not present

## 2018-11-04 DIAGNOSIS — M21372 Foot drop, left foot: Secondary | ICD-10-CM | POA: Diagnosis not present

## 2018-11-04 DIAGNOSIS — I25119 Atherosclerotic heart disease of native coronary artery with unspecified angina pectoris: Secondary | ICD-10-CM | POA: Diagnosis not present

## 2018-11-04 DIAGNOSIS — J449 Chronic obstructive pulmonary disease, unspecified: Secondary | ICD-10-CM | POA: Diagnosis not present

## 2018-11-04 DIAGNOSIS — Z953 Presence of xenogenic heart valve: Secondary | ICD-10-CM | POA: Diagnosis not present

## 2018-11-04 DIAGNOSIS — Z48817 Encounter for surgical aftercare following surgery on the skin and subcutaneous tissue: Secondary | ICD-10-CM | POA: Diagnosis not present

## 2018-11-04 DIAGNOSIS — I130 Hypertensive heart and chronic kidney disease with heart failure and stage 1 through stage 4 chronic kidney disease, or unspecified chronic kidney disease: Secondary | ICD-10-CM | POA: Diagnosis not present

## 2018-11-04 DIAGNOSIS — F0150 Vascular dementia without behavioral disturbance: Secondary | ICD-10-CM | POA: Diagnosis not present

## 2018-11-04 DIAGNOSIS — N183 Chronic kidney disease, stage 3 (moderate): Secondary | ICD-10-CM | POA: Diagnosis not present

## 2018-11-04 DIAGNOSIS — I272 Pulmonary hypertension, unspecified: Secondary | ICD-10-CM | POA: Diagnosis not present

## 2018-11-04 DIAGNOSIS — Z87891 Personal history of nicotine dependence: Secondary | ICD-10-CM | POA: Diagnosis not present

## 2018-11-04 DIAGNOSIS — Z5181 Encounter for therapeutic drug level monitoring: Secondary | ICD-10-CM | POA: Diagnosis not present

## 2018-11-04 DIAGNOSIS — Z7901 Long term (current) use of anticoagulants: Secondary | ICD-10-CM | POA: Diagnosis not present

## 2018-11-05 DIAGNOSIS — I130 Hypertensive heart and chronic kidney disease with heart failure and stage 1 through stage 4 chronic kidney disease, or unspecified chronic kidney disease: Secondary | ICD-10-CM | POA: Diagnosis not present

## 2018-11-05 DIAGNOSIS — F0150 Vascular dementia without behavioral disturbance: Secondary | ICD-10-CM | POA: Diagnosis not present

## 2018-11-05 DIAGNOSIS — Z48817 Encounter for surgical aftercare following surgery on the skin and subcutaneous tissue: Secondary | ICD-10-CM | POA: Diagnosis not present

## 2018-11-05 DIAGNOSIS — I25119 Atherosclerotic heart disease of native coronary artery with unspecified angina pectoris: Secondary | ICD-10-CM | POA: Diagnosis not present

## 2018-11-05 DIAGNOSIS — I482 Chronic atrial fibrillation, unspecified: Secondary | ICD-10-CM | POA: Diagnosis not present

## 2018-11-05 DIAGNOSIS — I69318 Other symptoms and signs involving cognitive functions following cerebral infarction: Secondary | ICD-10-CM | POA: Diagnosis not present

## 2018-11-05 LAB — AEROBIC/ANAEROBIC CULTURE W GRAM STAIN (SURGICAL/DEEP WOUND)

## 2018-11-05 LAB — URINE CULTURE: Culture: <10000 — AB

## 2018-11-05 NOTE — Telephone Encounter (Addendum)
Called [place] and spoke with [employer] to see if they would be able to assist with the patient's wound changes on the days when Home Health will not be coming.  She asked what type of dressing will the patient need, and I explained just a KY dressing on (T,TH,Sat), the days when Home Health will not be coming.  Teresa[facility] said yes they could do that, and if we would fax over orders.  Orders for KY dressing on (T,TH,Sat), when Home Health is not there was faxed.  Confirmation received.//AB/CMA

## 2018-11-06 ENCOUNTER — Telehealth: Payer: Self-pay | Admitting: Cardiovascular Disease

## 2018-11-06 ENCOUNTER — Ambulatory Visit (INDEPENDENT_AMBULATORY_CARE_PROVIDER_SITE_OTHER): Payer: Medicare Other | Admitting: Cardiology

## 2018-11-06 ENCOUNTER — Telehealth: Payer: Self-pay | Admitting: Plastic Surgery

## 2018-11-06 ENCOUNTER — Encounter: Payer: Medicare Other | Admitting: Surgical

## 2018-11-06 DIAGNOSIS — I25119 Atherosclerotic heart disease of native coronary artery with unspecified angina pectoris: Secondary | ICD-10-CM | POA: Diagnosis not present

## 2018-11-06 DIAGNOSIS — Z5181 Encounter for therapeutic drug level monitoring: Secondary | ICD-10-CM

## 2018-11-06 DIAGNOSIS — I482 Chronic atrial fibrillation, unspecified: Secondary | ICD-10-CM | POA: Diagnosis not present

## 2018-11-06 DIAGNOSIS — I130 Hypertensive heart and chronic kidney disease with heart failure and stage 1 through stage 4 chronic kidney disease, or unspecified chronic kidney disease: Secondary | ICD-10-CM | POA: Diagnosis not present

## 2018-11-06 DIAGNOSIS — F0150 Vascular dementia without behavioral disturbance: Secondary | ICD-10-CM | POA: Diagnosis not present

## 2018-11-06 DIAGNOSIS — I69318 Other symptoms and signs involving cognitive functions following cerebral infarction: Secondary | ICD-10-CM | POA: Diagnosis not present

## 2018-11-06 DIAGNOSIS — Z48817 Encounter for surgical aftercare following surgery on the skin and subcutaneous tissue: Secondary | ICD-10-CM | POA: Diagnosis not present

## 2018-11-06 LAB — POCT INR: INR: 2.5 (ref 2.0–3.0)

## 2018-11-06 MED ORDER — CLINDAMYCIN HCL 300 MG PO CAPS: 300.0000 mg | ORAL_CAPSULE | Freq: Two times a day (BID) | ORAL | 0 refills | Status: AC

## 2018-11-06 NOTE — Telephone Encounter (Signed)
New message   Brittany [name] is reporting INR result: 2.5 on today.

## 2018-11-06 NOTE — Telephone Encounter (Signed)
Micro back and sensitive to Clinda.

## 2018-11-06 NOTE — Telephone Encounter (Signed)
Spoke with Architect with [facility]. Please refer to Anticoagulation Encounter.

## 2018-11-07 DIAGNOSIS — I10 Essential (primary) hypertension: Secondary | ICD-10-CM | POA: Diagnosis not present

## 2018-11-07 DIAGNOSIS — N3010 Interstitial cystitis (chronic) without hematuria: Secondary | ICD-10-CM | POA: Diagnosis not present

## 2018-11-07 DIAGNOSIS — K219 Gastro-esophageal reflux disease without esophagitis: Secondary | ICD-10-CM | POA: Diagnosis not present

## 2018-11-07 DIAGNOSIS — I5040 Unspecified combined systolic (congestive) and diastolic (congestive) heart failure: Secondary | ICD-10-CM | POA: Diagnosis not present

## 2018-11-09 DIAGNOSIS — I25119 Atherosclerotic heart disease of native coronary artery with unspecified angina pectoris: Secondary | ICD-10-CM | POA: Diagnosis not present

## 2018-11-09 DIAGNOSIS — I482 Chronic atrial fibrillation, unspecified: Secondary | ICD-10-CM | POA: Diagnosis not present

## 2018-11-09 DIAGNOSIS — I69318 Other symptoms and signs involving cognitive functions following cerebral infarction: Secondary | ICD-10-CM | POA: Diagnosis not present

## 2018-11-09 DIAGNOSIS — Z48817 Encounter for surgical aftercare following surgery on the skin and subcutaneous tissue: Secondary | ICD-10-CM | POA: Diagnosis not present

## 2018-11-09 DIAGNOSIS — F0150 Vascular dementia without behavioral disturbance: Secondary | ICD-10-CM | POA: Diagnosis not present

## 2018-11-09 DIAGNOSIS — I130 Hypertensive heart and chronic kidney disease with heart failure and stage 1 through stage 4 chronic kidney disease, or unspecified chronic kidney disease: Secondary | ICD-10-CM | POA: Diagnosis not present

## 2018-11-12 DIAGNOSIS — I69318 Other symptoms and signs involving cognitive functions following cerebral infarction: Secondary | ICD-10-CM | POA: Diagnosis not present

## 2018-11-12 DIAGNOSIS — Z48817 Encounter for surgical aftercare following surgery on the skin and subcutaneous tissue: Secondary | ICD-10-CM | POA: Diagnosis not present

## 2018-11-12 DIAGNOSIS — F0150 Vascular dementia without behavioral disturbance: Secondary | ICD-10-CM | POA: Diagnosis not present

## 2018-11-12 DIAGNOSIS — I25119 Atherosclerotic heart disease of native coronary artery with unspecified angina pectoris: Secondary | ICD-10-CM | POA: Diagnosis not present

## 2018-11-12 DIAGNOSIS — I482 Chronic atrial fibrillation, unspecified: Secondary | ICD-10-CM | POA: Diagnosis not present

## 2018-11-12 DIAGNOSIS — I130 Hypertensive heart and chronic kidney disease with heart failure and stage 1 through stage 4 chronic kidney disease, or unspecified chronic kidney disease: Secondary | ICD-10-CM | POA: Diagnosis not present

## 2018-11-13 ENCOUNTER — Other Ambulatory Visit: Payer: Self-pay

## 2018-11-13 ENCOUNTER — Encounter: Payer: Self-pay | Admitting: Plastic Surgery

## 2018-11-13 ENCOUNTER — Ambulatory Visit (INDEPENDENT_AMBULATORY_CARE_PROVIDER_SITE_OTHER): Payer: Medicare Other | Admitting: Plastic Surgery

## 2018-11-13 VITALS — BP 112/66 | HR 63 | Temp 97.8°F | Ht 61.0 in | Wt 121.4 lb

## 2018-11-13 DIAGNOSIS — S91302D Unspecified open wound, left foot, subsequent encounter: Secondary | ICD-10-CM

## 2018-11-13 DIAGNOSIS — I214 Non-ST elevation (NSTEMI) myocardial infarction: Secondary | ICD-10-CM | POA: Diagnosis not present

## 2018-11-13 DIAGNOSIS — F411 Generalized anxiety disorder: Secondary | ICD-10-CM | POA: Diagnosis not present

## 2018-11-13 NOTE — Progress Notes (Signed)
   Subjective:    Patient ID: Brittany [name], female    DOB: 31-May-1927, 85 y.o.   MRN: [id number]  The patient is an 85 year old female here for follow-up on her left foot wound.  The microbiology was positive for infection.  She is on clindamycin and it appears she is taking it according to her records.  She does not recall all of her meds.  She is here with her son.  She is very pleased with her progress.  She says that there is still some pain but not like it had been.  The ACell is still in place.  It appears to be moist.  There is a little bit of redness around the area but it is overall improving.   Review of Systems  Constitutional: Negative.   HENT: Negative.   Cardiovascular: Positive for leg swelling.  Gastrointestinal: Negative for abdominal pain.  Musculoskeletal: Positive for gait problem.  Skin: Positive for wound.       Objective:   Physical Exam Vitals signs and nursing note reviewed.  Constitutional:      Appearance: Normal appearance.  HENT:     Head: Normocephalic and atraumatic.  Cardiovascular:     Rate and Rhythm: Normal rate.  Pulmonary:     Effort: Pulmonary effort is normal.  Musculoskeletal:       Feet:  Neurological:     General: No focal deficit present.     Mental Status: She is alert. Mental status is at baseline.  Psychiatric:        Mood and Affect: Mood normal.        Behavior: Behavior normal.        Thought Content: Thought content normal.        Assessment & Plan:     ICD-10-CM   1. Open wound of left foot, subsequent encounter  S91.302D     I would like to see her back in 3 weeks.  She should continue with the [facility] dressing changes once a day to the area.

## 2018-11-14 ENCOUNTER — Ambulatory Visit (INDEPENDENT_AMBULATORY_CARE_PROVIDER_SITE_OTHER): Payer: Medicare Other | Admitting: Internal Medicine

## 2018-11-14 DIAGNOSIS — R300 Dysuria: Secondary | ICD-10-CM | POA: Diagnosis not present

## 2018-11-14 DIAGNOSIS — I482 Chronic atrial fibrillation, unspecified: Secondary | ICD-10-CM

## 2018-11-14 DIAGNOSIS — Z5181 Encounter for therapeutic drug level monitoring: Secondary | ICD-10-CM

## 2018-11-14 DIAGNOSIS — F0150 Vascular dementia without behavioral disturbance: Secondary | ICD-10-CM | POA: Diagnosis not present

## 2018-11-14 DIAGNOSIS — I69318 Other symptoms and signs involving cognitive functions following cerebral infarction: Secondary | ICD-10-CM | POA: Diagnosis not present

## 2018-11-14 DIAGNOSIS — I130 Hypertensive heart and chronic kidney disease with heart failure and stage 1 through stage 4 chronic kidney disease, or unspecified chronic kidney disease: Secondary | ICD-10-CM | POA: Diagnosis not present

## 2018-11-14 DIAGNOSIS — I25119 Atherosclerotic heart disease of native coronary artery with unspecified angina pectoris: Secondary | ICD-10-CM | POA: Diagnosis not present

## 2018-11-14 DIAGNOSIS — G894 Chronic pain syndrome: Secondary | ICD-10-CM | POA: Diagnosis not present

## 2018-11-14 DIAGNOSIS — Z48817 Encounter for surgical aftercare following surgery on the skin and subcutaneous tissue: Secondary | ICD-10-CM | POA: Diagnosis not present

## 2018-11-14 LAB — POCT INR: INR: 2.9 (ref 2.0–3.0)

## 2018-11-14 NOTE — Patient Instructions (Signed)
Description   Spoke with [facility] RN [facility] and instructed to have pt continue Coumadin 5mg  (1 tablet) daily. Recheck INR in 3 weeks. Fax over orders to [facility] at [phone].  Call with any changes [phone].

## 2018-11-16 DIAGNOSIS — I25119 Atherosclerotic heart disease of native coronary artery with unspecified angina pectoris: Secondary | ICD-10-CM | POA: Diagnosis not present

## 2018-11-16 DIAGNOSIS — F0150 Vascular dementia without behavioral disturbance: Secondary | ICD-10-CM | POA: Diagnosis not present

## 2018-11-16 DIAGNOSIS — I130 Hypertensive heart and chronic kidney disease with heart failure and stage 1 through stage 4 chronic kidney disease, or unspecified chronic kidney disease: Secondary | ICD-10-CM | POA: Diagnosis not present

## 2018-11-16 DIAGNOSIS — Z48817 Encounter for surgical aftercare following surgery on the skin and subcutaneous tissue: Secondary | ICD-10-CM | POA: Diagnosis not present

## 2018-11-16 DIAGNOSIS — I69318 Other symptoms and signs involving cognitive functions following cerebral infarction: Secondary | ICD-10-CM | POA: Diagnosis not present

## 2018-11-16 DIAGNOSIS — I482 Chronic atrial fibrillation, unspecified: Secondary | ICD-10-CM | POA: Diagnosis not present

## 2018-11-20 DIAGNOSIS — L603 Nail dystrophy: Secondary | ICD-10-CM | POA: Diagnosis not present

## 2018-11-20 DIAGNOSIS — B351 Tinea unguium: Secondary | ICD-10-CM | POA: Diagnosis not present

## 2018-11-20 DIAGNOSIS — F0150 Vascular dementia without behavioral disturbance: Secondary | ICD-10-CM | POA: Diagnosis not present

## 2018-11-20 DIAGNOSIS — I25119 Atherosclerotic heart disease of native coronary artery with unspecified angina pectoris: Secondary | ICD-10-CM | POA: Diagnosis not present

## 2018-11-20 DIAGNOSIS — I739 Peripheral vascular disease, unspecified: Secondary | ICD-10-CM | POA: Diagnosis not present

## 2018-11-20 DIAGNOSIS — Z48817 Encounter for surgical aftercare following surgery on the skin and subcutaneous tissue: Secondary | ICD-10-CM | POA: Diagnosis not present

## 2018-11-20 DIAGNOSIS — I69318 Other symptoms and signs involving cognitive functions following cerebral infarction: Secondary | ICD-10-CM | POA: Diagnosis not present

## 2018-11-20 DIAGNOSIS — I482 Chronic atrial fibrillation, unspecified: Secondary | ICD-10-CM | POA: Diagnosis not present

## 2018-11-20 DIAGNOSIS — I130 Hypertensive heart and chronic kidney disease with heart failure and stage 1 through stage 4 chronic kidney disease, or unspecified chronic kidney disease: Secondary | ICD-10-CM | POA: Diagnosis not present

## 2018-11-20 DIAGNOSIS — Q845 Enlarged and hypertrophic nails: Secondary | ICD-10-CM | POA: Diagnosis not present

## 2018-11-22 DIAGNOSIS — Z48817 Encounter for surgical aftercare following surgery on the skin and subcutaneous tissue: Secondary | ICD-10-CM | POA: Diagnosis not present

## 2018-11-22 DIAGNOSIS — I130 Hypertensive heart and chronic kidney disease with heart failure and stage 1 through stage 4 chronic kidney disease, or unspecified chronic kidney disease: Secondary | ICD-10-CM | POA: Diagnosis not present

## 2018-11-22 DIAGNOSIS — F411 Generalized anxiety disorder: Secondary | ICD-10-CM | POA: Diagnosis not present

## 2018-11-22 DIAGNOSIS — I482 Chronic atrial fibrillation, unspecified: Secondary | ICD-10-CM | POA: Diagnosis not present

## 2018-11-22 DIAGNOSIS — I25119 Atherosclerotic heart disease of native coronary artery with unspecified angina pectoris: Secondary | ICD-10-CM | POA: Diagnosis not present

## 2018-11-22 DIAGNOSIS — F0150 Vascular dementia without behavioral disturbance: Secondary | ICD-10-CM | POA: Diagnosis not present

## 2018-11-22 DIAGNOSIS — I69318 Other symptoms and signs involving cognitive functions following cerebral infarction: Secondary | ICD-10-CM | POA: Diagnosis not present

## 2018-11-27 DIAGNOSIS — F411 Generalized anxiety disorder: Secondary | ICD-10-CM | POA: Diagnosis not present

## 2018-11-28 ENCOUNTER — Non-Acute Institutional Stay: Payer: Medicare Other | Admitting: Licensed Clinical Social Worker

## 2018-11-28 ENCOUNTER — Other Ambulatory Visit: Payer: Self-pay

## 2018-11-28 DIAGNOSIS — Z48817 Encounter for surgical aftercare following surgery on the skin and subcutaneous tissue: Secondary | ICD-10-CM | POA: Diagnosis not present

## 2018-11-28 DIAGNOSIS — Z515 Encounter for palliative care: Secondary | ICD-10-CM

## 2018-11-28 DIAGNOSIS — F0150 Vascular dementia without behavioral disturbance: Secondary | ICD-10-CM | POA: Diagnosis not present

## 2018-11-28 DIAGNOSIS — I69318 Other symptoms and signs involving cognitive functions following cerebral infarction: Secondary | ICD-10-CM | POA: Diagnosis not present

## 2018-11-28 DIAGNOSIS — I130 Hypertensive heart and chronic kidney disease with heart failure and stage 1 through stage 4 chronic kidney disease, or unspecified chronic kidney disease: Secondary | ICD-10-CM | POA: Diagnosis not present

## 2018-11-28 DIAGNOSIS — I25119 Atherosclerotic heart disease of native coronary artery with unspecified angina pectoris: Secondary | ICD-10-CM | POA: Diagnosis not present

## 2018-11-28 DIAGNOSIS — I482 Chronic atrial fibrillation, unspecified: Secondary | ICD-10-CM | POA: Diagnosis not present

## 2018-11-28 DIAGNOSIS — M48061 Spinal stenosis, lumbar region without neurogenic claudication: Secondary | ICD-10-CM | POA: Diagnosis not present

## 2018-11-28 NOTE — Progress Notes (Signed)
COMMUNITY PALLIATIVE CARE SW NOTE  PATIENT NAME: Brittany [name] DOB: 12-10-1927 MRN: [id number]  PRIMARY CARE PROVIDER: Mayra Neer, MD  RESPONSIBLE PARTY:  Acct ID - Guarantor Home Phone Work Phone Relationship Acct Type  [id number] Brittany [name], CANNELLA* [phone]  Self P/F     [facility], [place], [place],*   Due to the COVID-19 crisis, this virtual check-in visit was done via telephone from my office and it was initiated and consent given by this patient and or family.  PLAN OF CARE and INTERVENTIONS:             1. GOALS OF CARE/ ADVANCE CARE PLANNING:  Patient's goal is for her foot to heal.  She is a DNR. 2. SOCIAL/EMOTIONAL/SPIRITUAL ASSESSMENT/ INTERVENTIONS:  SW contacted patient at [facility] and conducted a virtual check-in visit.  She was alert and oriented.  SW provided active listening and supportive counseling while she discussed her recent foot surgery.  She stated she "loves" where she loves and has adjusted well.  She reports she will be having visitors today and tomorrow.  Patient's appetite has improved, but her sleeping has not. 3. PATIENT/CAREGIVER EDUCATION/ COPING:  Patient expresses her feelings openly. 4. PERSONAL EMERGENCY PLAN:  Per facility protocol. 5. COMMUNITY RESOURCES COORDINATION/ HEALTH CARE NAVIGATION:  [facility] nurses are changing patient's foot dressing twice a week. 6. FINANCIAL/LEGAL CONCERNS/INTERVENTIONS:  None.     SOCIAL HX:  Social History   Tobacco Use  . Smoking status: Former Smoker    Packs/day: 0.50    Years: 40.00    Pack years: 20.00    Types: Cigarettes    Start date: 05/06/1949    Quit date: 03/06/1990    Years since quitting: 28.7  . Smokeless tobacco: Never Used  Substance Use Topics  . Alcohol use: No    Alcohol/week: 0.0 standard drinks    Comment: Remote social EtOH    CODE STATUS:  DNR ADVANCED DIRECTIVES: N MOST FORM COMPLETE:  N HOSPICE EDUCATION PROVIDED: N Duration  of visit and documentation:  30 minutes.      Creola Corn Ules Marsala, LCSW

## 2018-11-30 DIAGNOSIS — F0150 Vascular dementia without behavioral disturbance: Secondary | ICD-10-CM | POA: Diagnosis not present

## 2018-11-30 DIAGNOSIS — I482 Chronic atrial fibrillation, unspecified: Secondary | ICD-10-CM | POA: Diagnosis not present

## 2018-11-30 DIAGNOSIS — I69318 Other symptoms and signs involving cognitive functions following cerebral infarction: Secondary | ICD-10-CM | POA: Diagnosis not present

## 2018-11-30 DIAGNOSIS — I25119 Atherosclerotic heart disease of native coronary artery with unspecified angina pectoris: Secondary | ICD-10-CM | POA: Diagnosis not present

## 2018-11-30 DIAGNOSIS — Z48817 Encounter for surgical aftercare following surgery on the skin and subcutaneous tissue: Secondary | ICD-10-CM | POA: Diagnosis not present

## 2018-11-30 DIAGNOSIS — I130 Hypertensive heart and chronic kidney disease with heart failure and stage 1 through stage 4 chronic kidney disease, or unspecified chronic kidney disease: Secondary | ICD-10-CM | POA: Diagnosis not present

## 2018-12-03 ENCOUNTER — Telehealth: Payer: Self-pay

## 2018-12-03 NOTE — Telephone Encounter (Signed)
Called patient to confirm appointment scheduled for tomorrow. Patient's daughter answered the following questions: 1. To the best of your knowledge, have you been in close contact with any one with a confirmed diagnosis of COVID-19? No 2. Have you had any one or more of the following; fever, chills, cough, shortness of breath, or any flu-like symptoms? No 3. Have you been diagnosed with or have a previous diagnosis of COVID 19? No 4. I am going to go over a few other symptoms with you. Please let me know if you are experiencing any of the following: None of the below a. Ear, nose, or throat discomfort b. A sore throat c. Headache d. Muscle pain e. Diarrhea f. Loss of taste or smell   

## 2018-12-04 ENCOUNTER — Other Ambulatory Visit: Payer: Self-pay

## 2018-12-04 ENCOUNTER — Encounter: Payer: Self-pay | Admitting: Nurse Practitioner

## 2018-12-04 ENCOUNTER — Ambulatory Visit (INDEPENDENT_AMBULATORY_CARE_PROVIDER_SITE_OTHER): Payer: Medicare Other | Admitting: Nurse Practitioner

## 2018-12-04 VITALS — BP 109/64 | HR 53 | Temp 98.0°F | Ht 61.0 in | Wt 122.1 lb

## 2018-12-04 DIAGNOSIS — I272 Pulmonary hypertension, unspecified: Secondary | ICD-10-CM | POA: Diagnosis not present

## 2018-12-04 DIAGNOSIS — I482 Chronic atrial fibrillation, unspecified: Secondary | ICD-10-CM | POA: Diagnosis not present

## 2018-12-04 DIAGNOSIS — I25119 Atherosclerotic heart disease of native coronary artery with unspecified angina pectoris: Secondary | ICD-10-CM | POA: Diagnosis not present

## 2018-12-04 DIAGNOSIS — F0150 Vascular dementia without behavioral disturbance: Secondary | ICD-10-CM | POA: Diagnosis not present

## 2018-12-04 DIAGNOSIS — I130 Hypertensive heart and chronic kidney disease with heart failure and stage 1 through stage 4 chronic kidney disease, or unspecified chronic kidney disease: Secondary | ICD-10-CM | POA: Diagnosis not present

## 2018-12-04 DIAGNOSIS — Z48817 Encounter for surgical aftercare following surgery on the skin and subcutaneous tissue: Secondary | ICD-10-CM | POA: Diagnosis not present

## 2018-12-04 DIAGNOSIS — M21372 Foot drop, left foot: Secondary | ICD-10-CM | POA: Diagnosis not present

## 2018-12-04 DIAGNOSIS — Z87891 Personal history of nicotine dependence: Secondary | ICD-10-CM | POA: Diagnosis not present

## 2018-12-04 DIAGNOSIS — Z5181 Encounter for therapeutic drug level monitoring: Secondary | ICD-10-CM | POA: Diagnosis not present

## 2018-12-04 DIAGNOSIS — N183 Chronic kidney disease, stage 3 (moderate): Secondary | ICD-10-CM | POA: Diagnosis not present

## 2018-12-04 DIAGNOSIS — Z953 Presence of xenogenic heart valve: Secondary | ICD-10-CM | POA: Diagnosis not present

## 2018-12-04 DIAGNOSIS — S91302D Unspecified open wound, left foot, subsequent encounter: Secondary | ICD-10-CM

## 2018-12-04 DIAGNOSIS — I69318 Other symptoms and signs involving cognitive functions following cerebral infarction: Secondary | ICD-10-CM | POA: Diagnosis not present

## 2018-12-04 DIAGNOSIS — J9611 Chronic respiratory failure with hypoxia: Secondary | ICD-10-CM | POA: Diagnosis not present

## 2018-12-04 DIAGNOSIS — I5032 Chronic diastolic (congestive) heart failure: Secondary | ICD-10-CM | POA: Diagnosis not present

## 2018-12-04 DIAGNOSIS — Z7901 Long term (current) use of anticoagulants: Secondary | ICD-10-CM | POA: Diagnosis not present

## 2018-12-04 DIAGNOSIS — M19011 Primary osteoarthritis, right shoulder: Secondary | ICD-10-CM | POA: Diagnosis not present

## 2018-12-04 DIAGNOSIS — J449 Chronic obstructive pulmonary disease, unspecified: Secondary | ICD-10-CM | POA: Diagnosis not present

## 2018-12-04 MED ORDER — CLINDAMYCIN HCL 300 MG PO CAPS: 300.0000 mg | ORAL_CAPSULE | Freq: Two times a day (BID) | ORAL | 0 refills | Status: AC

## 2018-12-04 NOTE — Progress Notes (Signed)
Patient ID: Brittany [name], female    DOB: 1928-02-16, 85 y.o.   MRN: [id number]  C.C.: wound follow up  Brittany [name] is an 85 yo female who presents for follow-up after undergoing debridement of left foot with Acell placement on 10/31/18. The culture sample grew staphylococcus aureus, sensitive to clindamycin. Patient completed a 14 day course clindamycin. The wound is covered with vaseline and gauze daily. A home health nurse changes the dressing on Tuesdays and Saturdays. Staff members from the assisted living facility change the dressing on other days. The pain has improved. The pain occurs when she flexes her foot. Patient states she is now able to bear weight and walk on the left foot.  Review of Systems  Constitutional: Positive for activity change. Negative for chills and fever.  HENT: Negative.   Respiratory: Negative.   Cardiovascular: Negative.   Gastrointestinal: Negative.   Genitourinary: Negative.   Musculoskeletal: Positive for gait problem.  Skin: Positive for wound.    Past Medical History:  Diagnosis Date  . Abnormal liver diagnostic imaging    suspected cirrhosis  . Arthritis   . Atrial fibrillation ([place])   . Blind    legally  . Chronic back pain   . COPD (chronic obstructive pulmonary disease) ([place])   . Diverticulosis   . Dyslipidemia    takes Lipitor daily  . Dysrhythmia    Atrial Fibrillation  . Foot drop    SINCE 1987  . GERD (gastroesophageal reflux disease)   . H/O hiatal hernia   . Hearing impaired    Bilateral hearing aids  . History of bacterial endocarditis   . History of gastritis   . History of stomach ulcers    many yrs ago  . History of TIA (transient ischemic attack)    2013-- RESIDUAL PERIPHERAL VISION RIGHT EYE--  RESOLVED  . Hypertension   . IC (interstitial cystitis)   . Macular degeneration   . Myocardial infarction ([place])   . Pulmonary hypertension ([place]) 10/2013   Based on TTE  . S/P aortic valve replacement    2005  .  Stroke [facility])    TIA, no preherial vison   . Urine incontinence   . Wears dentures   . Wears glasses     Past Surgical History:  Procedure Laterality Date  . ABDOMINAL HYSTERECTOMY  1967  . AORTIC VALVE REPLACEMENT  09-29-2003    [facility] pericardial tissue valve  . APPENDECTOMY  1933  . APPLICATION OF A-CELL OF EXTREMITY Left 09/06/2018   Procedure: DEBRIDMENT LEFT FOOT WITH ACELL PLACEMENT;  Surgeon: Wallace Going, DO;  Location: [place];  Service: Plastics;  Laterality: Left;  . APPLICATION OF A-CELL OF EXTREMITY Left 10/31/2018   Procedure: APPLICATION OF A-CELL OF EXTREMITY;  Surgeon: Wallace Going, DO;  Location: [place];  Service: Plastics;  Laterality: Left;  . BIOPSY  02/01/2018   Procedure: BIOPSY;  Surgeon: Rush Landmark Telford Nab., MD;  Location: [place];  Service: Gastroenterology;;  . CARDIAC CATHETERIZATION  07-15-2003 DR HELEN PRESTON   MODERATE TO MODERATELY SEVERE CALCIFIC AORTIC STENOSIS/  NORMAL CORONARY ARTERIES  . CARDIOVASCULAR STRESS TEST  01-30-2012  DR NASHER   NORMAL NUCLEAR STUDY/  EF 73%/  NORMAL LVF  . CARPAL TUNNEL RELEASE Bilateral   . CATARACT EXTRACTION W/ INTRAOCULAR LENS  IMPLANT, BILATERAL    . CHOLECYSTECTOMY  1993  . CYSTO WITH HYDRODISTENSION N/A 02/05/2013   Procedure: CYSTOSCOPY/HYDRODISTENSION;  Surgeon: Irine Seal, MD;  Location:  [place];  Service: Urology;  Laterality: N/A;  . CYSTO/ HYDRODISTENTION/ INSTILLATION CLORPACTIN  MULTIPLE  last one 2009  . DILATION AND CURETTAGE OF UTERUS    . ERCP N/A 08/10/2012   Procedure: ENDOSCOPIC RETROGRADE CHOLANGIOPANCREATOGRAPHY (ERCP);  Surgeon: Inda Castle, MD;  Location: [place];  Service: Gastroenterology;  Laterality: N/A;  . ESOPHAGOGASTRODUODENOSCOPY (EGD) WITH PROPOFOL N/A 08/18/2014   Procedure: ESOPHAGOGASTRODUODENOSCOPY (EGD) WITH PROPOFOL;  Surgeon: Inda Castle, MD;  Location: [facility];  Service: Endoscopy;  Laterality: N/A;  .  ESOPHAGOGASTRODUODENOSCOPY (EGD) WITH PROPOFOL N/A 02/01/2018   Procedure: ESOPHAGOGASTRODUODENOSCOPY (EGD) WITH PROPOFOL;  Surgeon: Rush Landmark Telford Nab., MD;  Location: [place];  Service: Gastroenterology;  Laterality: N/A;  . FOREIGN BODY REMOVAL  02/01/2018   Procedure: FOREIGN BODY REMOVAL;  Surgeon: Rush Landmark Telford Nab., MD;  Location: [place];  Service: Gastroenterology;;  . INCISION AND DRAINAGE OF WOUND Left 10/31/2018   Procedure: DEBRIDEMENT LEFT FOOT WOUND;  Surgeon: Wallace Going, DO;  Location: [place];  Service: Plastics;  Laterality: Left;  . LEFT HEART CATHETERIZATION WITH CORONARY ANGIOGRAM N/A 10/18/2013   Procedure: LEFT HEART CATHETERIZATION WITH CORONARY ANGIOGRAM;  Surgeon: Jettie Booze, MD;  Location: [facility] CATH LAB;  Service: Cardiovascular;  Laterality: N/A;  . LUMBAR Seth Ward &  2007  . MACROPLASTIQUE URETHRAL IMPLANTATION  08-27-2009  . MINI-OPEN RIGHT ROTATOR CUFF REPAIR  07-12-2001  . RIGHT SHOULDER ARTHROSCOPY W/ DEBRIDEMENT ROTATOR CUFF AND LABRAL TEAR/ ACROMINOPLASTY/ DISTAL CLAVICLE EXCISION/ CA LIGAMENT RELEASE  10-08-1999  . TEE WITHOUT CARDIOVERSION N/A 06/20/2018   Procedure: TRANSESOPHAGEAL ECHOCARDIOGRAM (TEE);  Surgeon: Jerline Pain, MD;  Location: [facility] ENDOSCOPY;  Service: Cardiovascular;  Laterality: N/A;  . TONSILLECTOMY AND ADENOIDECTOMY    . TRANSTHORACIC ECHOCARDIOGRAM  08-10-2011   MILD LVH/  EF 55-60%/  NORMAL AVR TISSUE/ MILD MR/  MODERATE DILATED LA/  MILD DILATED RA  . TRANSVAGINAL TAPE PROCEDURE  07-30-2002  . VEIN LIGATION  1969   RIGHT LOWER LEG      Current Outpatient Medications:  .  acetaminophen (TYLENOL) 500 MG tablet, Take 500 mg by mouth every 6 (six) hours as needed (for pain/fever)., Disp: , Rfl:  .  albuterol (VENTOLIN HFA) 108 (90 Base) MCG/ACT inhaler, Inhale 1 puff into the lungs every 4 (four) hours as needed for wheezing or shortness of breath., Disp: , Rfl:  .  atorvastatin (LIPITOR) 20 MG  tablet, Take 20 mg by mouth daily., Disp: , Rfl:  .  busPIRone (BUSPAR) 15 MG tablet, Take 1 tablet (15 mg total) by mouth 2 (two) times daily., Disp: 180 tablet, Rfl: 2 .  carboxymethylcellulose 1 % ophthalmic solution, Place 1 drop into both eyes at bedtime., Disp: , Rfl:  .  cycloSPORINE (RESTASIS) 0.05 % ophthalmic emulsion, Place 1 drop into both eyes 2 (two) times daily., Disp: , Rfl:  .  diazepam (VALIUM) 5 MG tablet, Take 5 mg by mouth at bedtime as needed for anxiety., Disp: , Rfl:  .  diltiazem (CARDIZEM) 120 MG tablet, Take 120 mg by mouth 2 (two) times daily., Disp: , Rfl:  .  enoxaparin (LOVENOX) 80 MG/0.8ML injection, Inject 80 mg into the skin daily. To hod on the 21 and 22 nd- per [place] [facility], Disp: , Rfl:  .  Eyelid Cleansers (OCUSOFT LID SCRUB EX), Apply 1 application topically 2 (two) times a day., Disp: , Rfl:  .  famotidine (PEPCID) 40 MG tablet, Take 40 mg by mouth at bedtime.,  Disp: , Rfl:  .  HYDROcodone-acetaminophen (NORCO/VICODIN) 5-325 MG tablet, Take 1 tablet by mouth every 6 (six) hours as needed for severe pain., Disp: 14 tablet, Rfl: 0 .  Multiple Vitamins-Minerals (PRESERVISION AREDS 2 PO), Take 1 tablet by mouth daily., Disp: , Rfl:  .  nitroGLYCERIN (NITROSTAT) 0.4 MG SL tablet, Place 1 tablet (0.4 mg total) under the tongue every 5 (five) minutes as needed for chest pain., Disp: 30 tablet, Rfl: 0 .  ondansetron (ZOFRAN) 4 MG tablet, Take 4 mg by mouth every 4 (four) hours as needed for nausea or vomiting., Disp: , Rfl:  .  oxybutynin (DITROPAN-XL) 5 MG 24 hr tablet, Take 5 mg by mouth 2 (two) times a day., Disp: , Rfl:  .  pantoprazole (PROTONIX) 40 MG tablet, Take 40 mg by mouth 2 (two) times daily., Disp: , Rfl:  .  Polyvinyl Alcohol-Povidone (REFRESH OP), Place 1 drop into both eyes 2 (two) times daily., Disp: , Rfl:  .  potassium chloride (K-DUR) 10 MEQ tablet, Take 10 mEq by mouth daily. , Disp: , Rfl:  .  torsemide (DEMADEX) 20 MG tablet, Take 20  mg by mouth daily., Disp: , Rfl:  .  warfarin (COUMADIN) 5 MG tablet, Take 5 mg by mouth daily., Disp: , Rfl:  .  clindamycin (CLEOCIN) 300 MG capsule, Take 1 capsule (300 mg total) by mouth 2 (two) times daily for 14 days., Disp: 28 capsule, Rfl: 0   Objective:   Vitals:   12/04/18 1339  BP: 109/64  Pulse: (!) 53  Temp: 98 F (36.7 C)  SpO2: 100%    Physical Exam  General: alert, calm, no acute distress HEENT: normal Chest: symmetrical rise and fall Lungs: unlabored breathing Cardiac: +2 bilateral radial pulse, cap refill <3 sec Musculoskeletal: MAEx4 Neuro: A&O x3, calm, cooperative, unsteady gait Skin: 3 x 2.5 cm open wound to left lateral foot, mild to moderate erythema extending 1 inch around wound bed, non-tender to palpation   Assessment & Plan:  Lillyin Quine is an 85 yo female s/p debridement of left foot with Acell placement on 10/31/18. The wound is slow to heal. There is erythema surrounding the wound bed, concerning for infection. Will prescribe an additional 14 day course clindamycin. Will begin collagen dressing changes 3x/week after showers. Will continue vaseline and gauze on other days. Return in 1 month or sooner as needed.   Picture placed in chart with patient's consent.  Alfredo Batty, NP

## 2018-12-05 ENCOUNTER — Ambulatory Visit (INDEPENDENT_AMBULATORY_CARE_PROVIDER_SITE_OTHER): Payer: Medicare Other | Admitting: Cardiovascular Disease

## 2018-12-05 ENCOUNTER — Telehealth: Payer: Self-pay | Admitting: *Deleted

## 2018-12-05 DIAGNOSIS — Z5181 Encounter for therapeutic drug level monitoring: Secondary | ICD-10-CM | POA: Diagnosis not present

## 2018-12-05 DIAGNOSIS — J449 Chronic obstructive pulmonary disease, unspecified: Secondary | ICD-10-CM | POA: Diagnosis not present

## 2018-12-05 DIAGNOSIS — I482 Chronic atrial fibrillation, unspecified: Secondary | ICD-10-CM | POA: Diagnosis not present

## 2018-12-05 DIAGNOSIS — I25119 Atherosclerotic heart disease of native coronary artery with unspecified angina pectoris: Secondary | ICD-10-CM | POA: Diagnosis not present

## 2018-12-05 DIAGNOSIS — I130 Hypertensive heart and chronic kidney disease with heart failure and stage 1 through stage 4 chronic kidney disease, or unspecified chronic kidney disease: Secondary | ICD-10-CM | POA: Diagnosis not present

## 2018-12-05 DIAGNOSIS — F0150 Vascular dementia without behavioral disturbance: Secondary | ICD-10-CM | POA: Diagnosis not present

## 2018-12-05 DIAGNOSIS — Z48817 Encounter for surgical aftercare following surgery on the skin and subcutaneous tissue: Secondary | ICD-10-CM | POA: Diagnosis not present

## 2018-12-05 DIAGNOSIS — K219 Gastro-esophageal reflux disease without esophagitis: Secondary | ICD-10-CM | POA: Diagnosis not present

## 2018-12-05 DIAGNOSIS — I69318 Other symptoms and signs involving cognitive functions following cerebral infarction: Secondary | ICD-10-CM | POA: Diagnosis not present

## 2018-12-05 DIAGNOSIS — I739 Peripheral vascular disease, unspecified: Secondary | ICD-10-CM | POA: Diagnosis not present

## 2018-12-05 LAB — POCT INR: INR: 4.1 — AB (ref 2.0–3.0)

## 2018-12-05 NOTE — Telephone Encounter (Signed)
[facility] called and left message stating that they got Brittany [name]'s INR. Called number back and left message for them to call [facility] back at [phone].

## 2018-12-05 NOTE — Patient Instructions (Addendum)
Description   Spoke with [facility] RN [facility] and instructed to have pt hold coumadin today; take 1/2 a tablet tomorrow then continue Coumadin 5mg  (1 tablet) daily. Recheck INR in 2 weeks. Fax over orders to [facility] at [phone].  Call with any changes [phone].

## 2018-12-06 ENCOUNTER — Telehealth: Payer: Self-pay | Admitting: Cardiovascular Disease

## 2018-12-06 NOTE — Telephone Encounter (Signed)
Returned call to [employer] [facility] [employer], order faxed yesterday was not received until after pt had already take her Warfarin.  Needs new order faxed for today, faxed order to hold Warfarin x 1 dosage, then take 2.5mg  x 1 dosage, then start taking 5mg  daily.  Recheck in 2 weeks on 12/19/18 with results to [facility].

## 2018-12-06 NOTE — Telephone Encounter (Signed)
New Message  Jenny Reichmann from [facility] is calling in to see if a new order can be sent over. Patient had the coumadin on yesterday even though it was supposed to be held. Please give Jenny Reichmann a call back.

## 2018-12-07 DIAGNOSIS — F0150 Vascular dementia without behavioral disturbance: Secondary | ICD-10-CM | POA: Diagnosis not present

## 2018-12-07 DIAGNOSIS — I25119 Atherosclerotic heart disease of native coronary artery with unspecified angina pectoris: Secondary | ICD-10-CM | POA: Diagnosis not present

## 2018-12-07 DIAGNOSIS — I130 Hypertensive heart and chronic kidney disease with heart failure and stage 1 through stage 4 chronic kidney disease, or unspecified chronic kidney disease: Secondary | ICD-10-CM | POA: Diagnosis not present

## 2018-12-07 DIAGNOSIS — Z48817 Encounter for surgical aftercare following surgery on the skin and subcutaneous tissue: Secondary | ICD-10-CM | POA: Diagnosis not present

## 2018-12-07 DIAGNOSIS — I482 Chronic atrial fibrillation, unspecified: Secondary | ICD-10-CM | POA: Diagnosis not present

## 2018-12-07 DIAGNOSIS — I69318 Other symptoms and signs involving cognitive functions following cerebral infarction: Secondary | ICD-10-CM | POA: Diagnosis not present

## 2018-12-10 DIAGNOSIS — I482 Chronic atrial fibrillation, unspecified: Secondary | ICD-10-CM | POA: Diagnosis not present

## 2018-12-10 DIAGNOSIS — I69318 Other symptoms and signs involving cognitive functions following cerebral infarction: Secondary | ICD-10-CM | POA: Diagnosis not present

## 2018-12-10 DIAGNOSIS — F0150 Vascular dementia without behavioral disturbance: Secondary | ICD-10-CM | POA: Diagnosis not present

## 2018-12-10 DIAGNOSIS — I130 Hypertensive heart and chronic kidney disease with heart failure and stage 1 through stage 4 chronic kidney disease, or unspecified chronic kidney disease: Secondary | ICD-10-CM | POA: Diagnosis not present

## 2018-12-10 DIAGNOSIS — I25119 Atherosclerotic heart disease of native coronary artery with unspecified angina pectoris: Secondary | ICD-10-CM | POA: Diagnosis not present

## 2018-12-10 DIAGNOSIS — Z48817 Encounter for surgical aftercare following surgery on the skin and subcutaneous tissue: Secondary | ICD-10-CM | POA: Diagnosis not present

## 2018-12-10 NOTE — Telephone Encounter (Addendum)
Got in touch with [facility] 12/05/2018. See Anticoag encounter from 12/05/2018.

## 2018-12-11 DIAGNOSIS — F411 Generalized anxiety disorder: Secondary | ICD-10-CM | POA: Diagnosis not present

## 2018-12-12 DIAGNOSIS — F0150 Vascular dementia without behavioral disturbance: Secondary | ICD-10-CM | POA: Diagnosis not present

## 2018-12-12 DIAGNOSIS — I482 Chronic atrial fibrillation, unspecified: Secondary | ICD-10-CM | POA: Diagnosis not present

## 2018-12-12 DIAGNOSIS — I130 Hypertensive heart and chronic kidney disease with heart failure and stage 1 through stage 4 chronic kidney disease, or unspecified chronic kidney disease: Secondary | ICD-10-CM | POA: Diagnosis not present

## 2018-12-12 DIAGNOSIS — Z48817 Encounter for surgical aftercare following surgery on the skin and subcutaneous tissue: Secondary | ICD-10-CM | POA: Diagnosis not present

## 2018-12-12 DIAGNOSIS — I69318 Other symptoms and signs involving cognitive functions following cerebral infarction: Secondary | ICD-10-CM | POA: Diagnosis not present

## 2018-12-12 DIAGNOSIS — I25119 Atherosclerotic heart disease of native coronary artery with unspecified angina pectoris: Secondary | ICD-10-CM | POA: Diagnosis not present

## 2018-12-13 ENCOUNTER — Telehealth: Payer: Self-pay | Admitting: *Deleted

## 2018-12-13 NOTE — Telephone Encounter (Signed)
Received a call back from [facility] with [facility].  Informed her per Matthew,PA it's okay to change the wound care orders to 2 days a week.  She verbalized understanding and agreed.  Called Safari,RN back and [facility] informing her to per [facility] to make sure they continue the Collagen dressing.  Asked her to give me a call back if she has any questions.//AB/CMA

## 2018-12-13 NOTE — Telephone Encounter (Signed)
Received call from [facility] with [facility].  She stated that they are seeing the patient 3 days a week for wound care, and they would like have orders to decrease to 2 days a week.  Asked Safari,RN who was doing the wound care the days they are not there, and she stated that the facility where she lives does the dressing change on the days they don't come.  Informed Safari,RN that I will inform Dr. Marla Roe or [facility] of the request for the wound care change and give her a call back at [phone]).  She verbalized understanding and agreed.//AB/CMA  Spoke with Matthew,PA regarding the message above and he stated okay to decrease the wound care to 2 days a week, just may sure they continue the Collagen dressing.//AB/CMA

## 2018-12-13 NOTE — Telephone Encounter (Signed)
Called and [facility] @ 11:46am @ [phone]) asking Safari,RN to RTC regarding the message below.//AB/CMA

## 2018-12-14 DIAGNOSIS — I130 Hypertensive heart and chronic kidney disease with heart failure and stage 1 through stage 4 chronic kidney disease, or unspecified chronic kidney disease: Secondary | ICD-10-CM | POA: Diagnosis not present

## 2018-12-14 DIAGNOSIS — I482 Chronic atrial fibrillation, unspecified: Secondary | ICD-10-CM | POA: Diagnosis not present

## 2018-12-14 DIAGNOSIS — Z48817 Encounter for surgical aftercare following surgery on the skin and subcutaneous tissue: Secondary | ICD-10-CM | POA: Diagnosis not present

## 2018-12-14 DIAGNOSIS — I25119 Atherosclerotic heart disease of native coronary artery with unspecified angina pectoris: Secondary | ICD-10-CM | POA: Diagnosis not present

## 2018-12-14 DIAGNOSIS — F0150 Vascular dementia without behavioral disturbance: Secondary | ICD-10-CM | POA: Diagnosis not present

## 2018-12-14 DIAGNOSIS — I69318 Other symptoms and signs involving cognitive functions following cerebral infarction: Secondary | ICD-10-CM | POA: Diagnosis not present

## 2018-12-18 ENCOUNTER — Ambulatory Visit (INDEPENDENT_AMBULATORY_CARE_PROVIDER_SITE_OTHER): Payer: Medicare Other | Admitting: Cardiology

## 2018-12-18 ENCOUNTER — Other Ambulatory Visit: Payer: Self-pay | Admitting: Gastroenterology

## 2018-12-18 ENCOUNTER — Telehealth: Payer: Self-pay | Admitting: *Deleted

## 2018-12-18 DIAGNOSIS — I25119 Atherosclerotic heart disease of native coronary artery with unspecified angina pectoris: Secondary | ICD-10-CM | POA: Diagnosis not present

## 2018-12-18 DIAGNOSIS — R296 Repeated falls: Secondary | ICD-10-CM | POA: Diagnosis not present

## 2018-12-18 DIAGNOSIS — I69318 Other symptoms and signs involving cognitive functions following cerebral infarction: Secondary | ICD-10-CM | POA: Diagnosis not present

## 2018-12-18 DIAGNOSIS — Z5181 Encounter for therapeutic drug level monitoring: Secondary | ICD-10-CM

## 2018-12-18 DIAGNOSIS — Z48817 Encounter for surgical aftercare following surgery on the skin and subcutaneous tissue: Secondary | ICD-10-CM | POA: Diagnosis not present

## 2018-12-18 DIAGNOSIS — F0150 Vascular dementia without behavioral disturbance: Secondary | ICD-10-CM | POA: Diagnosis not present

## 2018-12-18 DIAGNOSIS — I482 Chronic atrial fibrillation, unspecified: Secondary | ICD-10-CM

## 2018-12-18 DIAGNOSIS — I130 Hypertensive heart and chronic kidney disease with heart failure and stage 1 through stage 4 chronic kidney disease, or unspecified chronic kidney disease: Secondary | ICD-10-CM | POA: Diagnosis not present

## 2018-12-18 LAB — POCT INR: INR: 7.9 — AB (ref 2.0–3.0)

## 2018-12-18 NOTE — Patient Instructions (Signed)
Description   Spoke with Mo RN [facility] [phone], pt already took warfarin today. She will draw a stat venopuncture. Pt is aware to eat extra greens today and skip warfarin tomorrow and Thursday. We will provide updated instructions once venopuncture results. Normal dose is 5mg  daily - will need dose decrease.  Will need orders faxed to [facility] at [phone]. Call with any changes [phone].

## 2018-12-18 NOTE — Telephone Encounter (Signed)
Mo with [facility] called and stated that the she did not have a blue tube to draw a STAT INR and that she forgot to report the pt fell and has a hematoma on the back of her head, asked if the facility reported it to her MD and she said they are going to call he PCP and follow-up.  Advised that it is important that the pt has a STAT INR done in the AM at the latest since she cannot get any blue tubes to draw an INR and the labs are closed. She stated she would put the pt in for a PRN visit for tomorrow.  dvised that the pt is not to have any Coumadin today and she stated she had it today already so advised none until we get the STAT INR back and she verbalized understanding.

## 2018-12-19 ENCOUNTER — Ambulatory Visit (INDEPENDENT_AMBULATORY_CARE_PROVIDER_SITE_OTHER): Payer: Medicare Other | Admitting: Interventional Cardiology

## 2018-12-19 ENCOUNTER — Telehealth: Payer: Self-pay

## 2018-12-19 DIAGNOSIS — R296 Repeated falls: Secondary | ICD-10-CM | POA: Diagnosis not present

## 2018-12-19 DIAGNOSIS — F0150 Vascular dementia without behavioral disturbance: Secondary | ICD-10-CM | POA: Diagnosis not present

## 2018-12-19 DIAGNOSIS — Z5181 Encounter for therapeutic drug level monitoring: Secondary | ICD-10-CM | POA: Diagnosis not present

## 2018-12-19 DIAGNOSIS — I482 Chronic atrial fibrillation, unspecified: Secondary | ICD-10-CM | POA: Diagnosis not present

## 2018-12-19 DIAGNOSIS — I69318 Other symptoms and signs involving cognitive functions following cerebral infarction: Secondary | ICD-10-CM | POA: Diagnosis not present

## 2018-12-19 DIAGNOSIS — I25119 Atherosclerotic heart disease of native coronary artery with unspecified angina pectoris: Secondary | ICD-10-CM | POA: Diagnosis not present

## 2018-12-19 DIAGNOSIS — I130 Hypertensive heart and chronic kidney disease with heart failure and stage 1 through stage 4 chronic kidney disease, or unspecified chronic kidney disease: Secondary | ICD-10-CM | POA: Diagnosis not present

## 2018-12-19 DIAGNOSIS — Z48817 Encounter for surgical aftercare following surgery on the skin and subcutaneous tissue: Secondary | ICD-10-CM | POA: Diagnosis not present

## 2018-12-19 LAB — PROTIME-INR: INR: 8.8 — AB (ref 0.9–1.1)

## 2018-12-19 NOTE — Telephone Encounter (Signed)
Called Mo RN with [facility] to ensure she drew the STAT PT/INR this morning and she stated she did and gave her the Anticoagulation Fax number to send it to or call to [place]. Will await for results and pt is in the [place] book as well.

## 2018-12-19 NOTE — Telephone Encounter (Signed)
STAT INR resulted and pt dosed. Please refer to Anticoagulation Encounter for detailed instructions.

## 2018-12-19 NOTE — Patient Instructions (Signed)
Description   Spoke with Mo RN [facility] [phone], Do not take any Coumadin today, No Coumadin Thursday, No Coumadin Friday, No Coumadin Saturday, No Coumadin Sunday and Recheck INR on Monday. Normal dose is 5mg  daily- will need dose decrease. Orders faxed to [facility] at [phone]. Call with any changes [phone].

## 2018-12-19 NOTE — Telephone Encounter (Signed)
I received a call from [place] [place] [phone] who reported a Critical lab result on the patient.  A (PT of 79) and (INR of 8.8) was reported from 9/9.  I forwarded these values to our Coumadin Clinic for advisement.

## 2018-12-20 DIAGNOSIS — Z48817 Encounter for surgical aftercare following surgery on the skin and subcutaneous tissue: Secondary | ICD-10-CM | POA: Diagnosis not present

## 2018-12-20 DIAGNOSIS — I25119 Atherosclerotic heart disease of native coronary artery with unspecified angina pectoris: Secondary | ICD-10-CM | POA: Diagnosis not present

## 2018-12-20 DIAGNOSIS — I130 Hypertensive heart and chronic kidney disease with heart failure and stage 1 through stage 4 chronic kidney disease, or unspecified chronic kidney disease: Secondary | ICD-10-CM | POA: Diagnosis not present

## 2018-12-20 DIAGNOSIS — I482 Chronic atrial fibrillation, unspecified: Secondary | ICD-10-CM | POA: Diagnosis not present

## 2018-12-20 DIAGNOSIS — I69318 Other symptoms and signs involving cognitive functions following cerebral infarction: Secondary | ICD-10-CM | POA: Diagnosis not present

## 2018-12-20 DIAGNOSIS — F0150 Vascular dementia without behavioral disturbance: Secondary | ICD-10-CM | POA: Diagnosis not present

## 2018-12-24 ENCOUNTER — Ambulatory Visit (INDEPENDENT_AMBULATORY_CARE_PROVIDER_SITE_OTHER): Payer: Medicare Other | Admitting: Internal Medicine

## 2018-12-24 DIAGNOSIS — I482 Chronic atrial fibrillation, unspecified: Secondary | ICD-10-CM

## 2018-12-24 DIAGNOSIS — Z5181 Encounter for therapeutic drug level monitoring: Secondary | ICD-10-CM | POA: Diagnosis not present

## 2018-12-24 DIAGNOSIS — I25119 Atherosclerotic heart disease of native coronary artery with unspecified angina pectoris: Secondary | ICD-10-CM | POA: Diagnosis not present

## 2018-12-24 DIAGNOSIS — Z48817 Encounter for surgical aftercare following surgery on the skin and subcutaneous tissue: Secondary | ICD-10-CM | POA: Diagnosis not present

## 2018-12-24 DIAGNOSIS — I69318 Other symptoms and signs involving cognitive functions following cerebral infarction: Secondary | ICD-10-CM | POA: Diagnosis not present

## 2018-12-24 DIAGNOSIS — F0150 Vascular dementia without behavioral disturbance: Secondary | ICD-10-CM | POA: Diagnosis not present

## 2018-12-24 DIAGNOSIS — I130 Hypertensive heart and chronic kidney disease with heart failure and stage 1 through stage 4 chronic kidney disease, or unspecified chronic kidney disease: Secondary | ICD-10-CM | POA: Diagnosis not present

## 2018-12-24 LAB — POCT INR: INR: 1.1 — AB (ref 2.0–3.0)

## 2018-12-25 ENCOUNTER — Telehealth: Payer: Self-pay | Admitting: *Deleted

## 2018-12-25 DIAGNOSIS — F411 Generalized anxiety disorder: Secondary | ICD-10-CM | POA: Diagnosis not present

## 2018-12-25 NOTE — Telephone Encounter (Signed)
Sandi Mariscal with [facility] called and stated that the facility did not give the pt any Coumadin last night because they did not have the medication from the pharmacy. The order was written and faxed over to the facility after INR was done by Mo RN [facility], she states they received the order but not in time for the pharmacy to get meds to them. Therefore, pt did not take any meds last night. She stated she needs a new order to administer the extra dose today and tomorrow. I rewrote the order and faxed over to [facility] at [phone]. Pt to receive 7.5mg  on 12/25/2018 and 12/26/2018 then take 5mg  daily except 2.5mg  on Sunday and Recheck INR on 12/31/2018.

## 2018-12-27 ENCOUNTER — Other Ambulatory Visit: Payer: Self-pay

## 2018-12-27 ENCOUNTER — Non-Acute Institutional Stay: Payer: Medicare Other | Admitting: Licensed Clinical Social Worker

## 2018-12-27 DIAGNOSIS — I25119 Atherosclerotic heart disease of native coronary artery with unspecified angina pectoris: Secondary | ICD-10-CM | POA: Diagnosis not present

## 2018-12-27 DIAGNOSIS — Z515 Encounter for palliative care: Secondary | ICD-10-CM

## 2018-12-27 DIAGNOSIS — I69318 Other symptoms and signs involving cognitive functions following cerebral infarction: Secondary | ICD-10-CM | POA: Diagnosis not present

## 2018-12-27 DIAGNOSIS — I130 Hypertensive heart and chronic kidney disease with heart failure and stage 1 through stage 4 chronic kidney disease, or unspecified chronic kidney disease: Secondary | ICD-10-CM | POA: Diagnosis not present

## 2018-12-27 DIAGNOSIS — F0150 Vascular dementia without behavioral disturbance: Secondary | ICD-10-CM | POA: Diagnosis not present

## 2018-12-27 DIAGNOSIS — F411 Generalized anxiety disorder: Secondary | ICD-10-CM | POA: Diagnosis not present

## 2018-12-27 DIAGNOSIS — Z48817 Encounter for surgical aftercare following surgery on the skin and subcutaneous tissue: Secondary | ICD-10-CM | POA: Diagnosis not present

## 2018-12-27 DIAGNOSIS — I482 Chronic atrial fibrillation, unspecified: Secondary | ICD-10-CM | POA: Diagnosis not present

## 2018-12-27 NOTE — Progress Notes (Signed)
COMMUNITY PALLIATIVE CARE SW NOTE  PATIENT NAME: HASET MONACHINO DOB: November 25, 1927 MRN: [id number]  PRIMARY CARE PROVIDER: Mayra Neer, MD  RESPONSIBLE PARTY:  Acct ID - Guarantor Home Phone Work Phone Relationship Acct Type  [id number] SHAUNTAE, [id number]  Self P/F     [facility], [address], [address], [place]     PLAN OF CARE and INTERVENTIONS:             1. GOALS OF CARE/ ADVANCE CARE PLANNING:  Goal is for patient's left foot to heal.  Patient has a DNR. 2. SOCIAL/EMOTIONAL/SPIRITUAL ASSESSMENT/ INTERVENTIONS:  SW conducted visit with patient in her room at [facility].  Also consulted [facility] [place], [place].  Patient reports her foot feels 100% better, although she may have to have one more surgery.  She has acid reflux last night and didn't go to sleep until 1:30am, so she was tired today.  Patient participates in facility activities.  SW provided active listening and supportive counseling while she discussed living at the facility.  The Med Tech expressed no concerns at this time. 3. PATIENT/CAREGIVER EDUCATION/ COPING:  Patient copes by expressing her feelings openly. 4. PERSONAL EMERGENCY PLAN:  Per facility protocol. 5. COMMUNITY RESOURCES COORDINATION/ HEALTH CARE NAVIGATION:  None. 6. FINANCIAL/LEGAL CONCERNS/INTERVENTIONS:  None.     SOCIAL HX:  Social History   Tobacco Use  . Smoking status: Former Smoker    Packs/day: 0.50    Years: 40.00    Pack years: 20.00    Types: Cigarettes    Start date: 05/06/1949    Quit date: 03/06/1990    Years since quitting: 28.8  . Smokeless tobacco: Never Used  Substance Use Topics  . Alcohol use: No    Alcohol/week: 0.0 standard drinks    Comment: Remote social EtOH    CODE STATUS:   ADVANCED DIRECTIVES: N MOST FORM COMPLETE:  HOSPICE EDUCATION PROVIDED:  N PPS:  Patient's appetite is normal.  She ambulates with a walker. Duration of visit and documentation:  45 minutes.      Creola Corn  Female Min[place], LCSW

## 2018-12-28 ENCOUNTER — Telehealth: Payer: Self-pay | Admitting: *Deleted

## 2018-12-28 NOTE — Telephone Encounter (Addendum)
[facility] faxed orders for MD to review. Called [facility] and updated them to make sure they had the most current and correct orders on file. [facility] stated they are not in charge of administering medication just checking the INR's but still wants the most current dosing on file.    Pt is to take 7.5mg  of coumadin on 12/25/18 and 12/26/18 and then to take 5mg  daily except for 2.5mg  on Sunday. INR to be rechecked on 12/31/2018. (According to phone note between [facility] and [facility] 12/25/2018)

## 2018-12-31 ENCOUNTER — Ambulatory Visit (INDEPENDENT_AMBULATORY_CARE_PROVIDER_SITE_OTHER): Payer: Medicare Other | Admitting: Internal Medicine

## 2018-12-31 DIAGNOSIS — I482 Chronic atrial fibrillation, unspecified: Secondary | ICD-10-CM

## 2018-12-31 DIAGNOSIS — I25119 Atherosclerotic heart disease of native coronary artery with unspecified angina pectoris: Secondary | ICD-10-CM | POA: Diagnosis not present

## 2018-12-31 DIAGNOSIS — F0150 Vascular dementia without behavioral disturbance: Secondary | ICD-10-CM | POA: Diagnosis not present

## 2018-12-31 DIAGNOSIS — Z48817 Encounter for surgical aftercare following surgery on the skin and subcutaneous tissue: Secondary | ICD-10-CM | POA: Diagnosis not present

## 2018-12-31 DIAGNOSIS — Z5181 Encounter for therapeutic drug level monitoring: Secondary | ICD-10-CM

## 2018-12-31 DIAGNOSIS — I130 Hypertensive heart and chronic kidney disease with heart failure and stage 1 through stage 4 chronic kidney disease, or unspecified chronic kidney disease: Secondary | ICD-10-CM | POA: Diagnosis not present

## 2018-12-31 DIAGNOSIS — I69318 Other symptoms and signs involving cognitive functions following cerebral infarction: Secondary | ICD-10-CM | POA: Diagnosis not present

## 2018-12-31 LAB — POCT INR: INR: 3 (ref 2.0–3.0)

## 2018-12-31 NOTE — Patient Instructions (Signed)
Description   Spoke with Mo RN [facility] [phone], advise to start taking 1 tablet daily except 1/2 tablet on Sundays and Thursdays. Recheck INR in 1 week. Orders faxed to [facility] at [phone]. Call with any changes [phone].

## 2019-01-02 DIAGNOSIS — I10 Essential (primary) hypertension: Secondary | ICD-10-CM | POA: Diagnosis not present

## 2019-01-02 DIAGNOSIS — K219 Gastro-esophageal reflux disease without esophagitis: Secondary | ICD-10-CM | POA: Diagnosis not present

## 2019-01-02 DIAGNOSIS — I5042 Chronic combined systolic (congestive) and diastolic (congestive) heart failure: Secondary | ICD-10-CM | POA: Diagnosis not present

## 2019-01-03 DIAGNOSIS — Z5181 Encounter for therapeutic drug level monitoring: Secondary | ICD-10-CM | POA: Diagnosis not present

## 2019-01-03 DIAGNOSIS — Z48817 Encounter for surgical aftercare following surgery on the skin and subcutaneous tissue: Secondary | ICD-10-CM | POA: Diagnosis not present

## 2019-01-03 DIAGNOSIS — I130 Hypertensive heart and chronic kidney disease with heart failure and stage 1 through stage 4 chronic kidney disease, or unspecified chronic kidney disease: Secondary | ICD-10-CM | POA: Diagnosis not present

## 2019-01-03 DIAGNOSIS — Z7901 Long term (current) use of anticoagulants: Secondary | ICD-10-CM | POA: Diagnosis not present

## 2019-01-03 DIAGNOSIS — F0150 Vascular dementia without behavioral disturbance: Secondary | ICD-10-CM | POA: Diagnosis not present

## 2019-01-03 DIAGNOSIS — I5032 Chronic diastolic (congestive) heart failure: Secondary | ICD-10-CM | POA: Diagnosis not present

## 2019-01-03 DIAGNOSIS — Z953 Presence of xenogenic heart valve: Secondary | ICD-10-CM | POA: Diagnosis not present

## 2019-01-03 DIAGNOSIS — J9611 Chronic respiratory failure with hypoxia: Secondary | ICD-10-CM | POA: Diagnosis not present

## 2019-01-03 DIAGNOSIS — J449 Chronic obstructive pulmonary disease, unspecified: Secondary | ICD-10-CM | POA: Diagnosis not present

## 2019-01-03 DIAGNOSIS — I25119 Atherosclerotic heart disease of native coronary artery with unspecified angina pectoris: Secondary | ICD-10-CM | POA: Diagnosis not present

## 2019-01-03 DIAGNOSIS — Z87891 Personal history of nicotine dependence: Secondary | ICD-10-CM | POA: Diagnosis not present

## 2019-01-03 DIAGNOSIS — I482 Chronic atrial fibrillation, unspecified: Secondary | ICD-10-CM | POA: Diagnosis not present

## 2019-01-03 DIAGNOSIS — I69318 Other symptoms and signs involving cognitive functions following cerebral infarction: Secondary | ICD-10-CM | POA: Diagnosis not present

## 2019-01-03 DIAGNOSIS — N183 Chronic kidney disease, stage 3 (moderate): Secondary | ICD-10-CM | POA: Diagnosis not present

## 2019-01-03 DIAGNOSIS — I272 Pulmonary hypertension, unspecified: Secondary | ICD-10-CM | POA: Diagnosis not present

## 2019-01-03 DIAGNOSIS — M21372 Foot drop, left foot: Secondary | ICD-10-CM | POA: Diagnosis not present

## 2019-01-07 ENCOUNTER — Other Ambulatory Visit: Payer: Self-pay

## 2019-01-07 ENCOUNTER — Encounter: Payer: Self-pay | Admitting: Nurse Practitioner

## 2019-01-07 ENCOUNTER — Ambulatory Visit (INDEPENDENT_AMBULATORY_CARE_PROVIDER_SITE_OTHER): Payer: Medicare Other | Admitting: Nurse Practitioner

## 2019-01-07 VITALS — BP 115/71 | HR 60 | Temp 97.8°F | Ht 61.0 in | Wt 126.0 lb

## 2019-01-07 DIAGNOSIS — S91302D Unspecified open wound, left foot, subsequent encounter: Secondary | ICD-10-CM

## 2019-01-07 NOTE — Progress Notes (Signed)
Patient ID: Brittany [name], female    DOB: 1927/06/01, 85 y.o.   MRN: [id number]   C.C.: follow up left foot wound  Brittany [name] is an 85 yo female who presents with her daughter for follow-up after undergoing debridement of left foot with Acell placement on 10/31/18. Patient was last seen on 12/04/18 and prescribed Collagen dressing 3x/week, with vaseline and gauze dressings in between. Patient was also prescribed a course of clindamycin for a wound infection at the site. Collagen dressing changes were later changed to 2x/week per home health nursing request. Patient states she occasionally gets a "stinging" pain in her left foot. Patient's daughter has noticed improvement in the wound. Patient states she is walking better. Patient is now going to exercise and dance classes within her assisted living facility. Patient's daughter requests an order to receive a bath 3x/week.   Review of Systems  Constitutional: Positive for activity change.  HENT: Positive for hearing loss.   Eyes:       Partial blindness  Respiratory: Negative.   Cardiovascular: Negative.   Gastrointestinal: Negative.   Genitourinary: Negative.   Musculoskeletal: Negative.   Skin: Positive for wound.  Neurological: Negative.   Psychiatric/Behavioral: Negative.     Past Medical History:  Diagnosis Date  . Abnormal liver diagnostic imaging    suspected cirrhosis  . Arthritis   . Atrial fibrillation ([place])   . Blind    legally  . Chronic back pain   . COPD (chronic obstructive pulmonary disease) ([place])   . Diverticulosis   . Dyslipidemia    takes Lipitor daily  . Dysrhythmia    Atrial Fibrillation  . Foot drop    SINCE 1987  . GERD (gastroesophageal reflux disease)   . H/O hiatal hernia   . Hearing impaired    Bilateral hearing aids  . History of bacterial endocarditis   . History of gastritis   . History of stomach ulcers    many yrs ago  . History of TIA (transient ischemic attack)    2013-- RESIDUAL  PERIPHERAL VISION RIGHT EYE--  RESOLVED  . Hypertension   . IC (interstitial cystitis)   . Macular degeneration   . Myocardial infarction ([place])   . Pulmonary hypertension ([place]) 10/2013   Based on TTE  . S/P aortic valve replacement    2005  . Stroke [facility])    TIA, no preherial vison   . Urine incontinence   . Wears dentures   . Wears glasses     Past Surgical History:  Procedure Laterality Date  . ABDOMINAL HYSTERECTOMY  1967  . AORTIC VALVE REPLACEMENT  09-29-2003    [facility] pericardial tissue valve  . APPENDECTOMY  1933  . APPLICATION OF A-CELL OF EXTREMITY Left 09/06/2018   Procedure: DEBRIDMENT LEFT FOOT WITH ACELL PLACEMENT;  Surgeon: Wallace Going, DO;  Location: [employer];  Service: Plastics;  Laterality: Left;  . APPLICATION OF A-CELL OF EXTREMITY Left 10/31/2018   Procedure: APPLICATION OF A-CELL OF EXTREMITY;  Surgeon: Wallace Going, DO;  Location: [place];  Service: Plastics;  Laterality: Left;  . BIOPSY  02/01/2018   Procedure: BIOPSY;  Surgeon: Rush Landmark Telford Nab., MD;  Location: [place];  Service: Gastroenterology;;  . CARDIAC CATHETERIZATION  07-15-2003 DR HELEN PRESTON   MODERATE TO MODERATELY SEVERE CALCIFIC AORTIC STENOSIS/  NORMAL CORONARY ARTERIES  . CARDIOVASCULAR STRESS TEST  01-30-2012  DR NASHER   NORMAL NUCLEAR STUDY/  EF 73%/  NORMAL LVF  .  CARPAL TUNNEL RELEASE Bilateral   . CATARACT EXTRACTION W/ INTRAOCULAR LENS  IMPLANT, BILATERAL    . CHOLECYSTECTOMY  1993  . CYSTO WITH HYDRODISTENSION N/A 02/05/2013   Procedure: CYSTOSCOPY/HYDRODISTENSION;  Surgeon: Irine Seal, MD;  Location: [facility];  Service: Urology;  Laterality: N/A;  . CYSTO/ HYDRODISTENTION/ INSTILLATION CLORPACTIN  MULTIPLE  last one 2009  . DILATION AND CURETTAGE OF UTERUS    . ERCP N/A 08/10/2012   Procedure: ENDOSCOPIC RETROGRADE CHOLANGIOPANCREATOGRAPHY (ERCP);  Surgeon: Inda Castle, MD;  Location: [place];  Service: Gastroenterology;   Laterality: N/A;  . ESOPHAGOGASTRODUODENOSCOPY (EGD) WITH PROPOFOL N/A 08/18/2014   Procedure: ESOPHAGOGASTRODUODENOSCOPY (EGD) WITH PROPOFOL;  Surgeon: Inda Castle, MD;  Location: [facility];  Service: Endoscopy;  Laterality: N/A;  . ESOPHAGOGASTRODUODENOSCOPY (EGD) WITH PROPOFOL N/A 02/01/2018   Procedure: ESOPHAGOGASTRODUODENOSCOPY (EGD) WITH PROPOFOL;  Surgeon: Rush Landmark Telford Nab., MD;  Location: [place];  Service: Gastroenterology;  Laterality: N/A;  . FOREIGN BODY REMOVAL  02/01/2018   Procedure: FOREIGN BODY REMOVAL;  Surgeon: Rush Landmark Telford Nab., MD;  Location: [place];  Service: Gastroenterology;;  . INCISION AND DRAINAGE OF WOUND Left 10/31/2018   Procedure: DEBRIDEMENT LEFT FOOT WOUND;  Surgeon: Wallace Going, DO;  Location: [place];  Service: Plastics;  Laterality: Left;  . LEFT HEART CATHETERIZATION WITH CORONARY ANGIOGRAM N/A 10/18/2013   Procedure: LEFT HEART CATHETERIZATION WITH CORONARY ANGIOGRAM;  Surgeon: Jettie Booze, MD;  Location: [facility] CATH LAB;  Service: Cardiovascular;  Laterality: N/A;  . LUMBAR [place] &  2007  . MACROPLASTIQUE URETHRAL IMPLANTATION  08-27-2009  . MINI-OPEN RIGHT ROTATOR CUFF REPAIR  07-12-2001  . RIGHT SHOULDER ARTHROSCOPY W/ DEBRIDEMENT ROTATOR CUFF AND LABRAL TEAR/ ACROMINOPLASTY/ DISTAL CLAVICLE EXCISION/ CA LIGAMENT RELEASE  10-08-1999  . TEE WITHOUT CARDIOVERSION N/A 06/20/2018   Procedure: TRANSESOPHAGEAL ECHOCARDIOGRAM (TEE);  Surgeon: Jerline Pain, MD;  Location: [facility] ENDOSCOPY;  Service: Cardiovascular;  Laterality: N/A;  . TONSILLECTOMY AND ADENOIDECTOMY    . TRANSTHORACIC ECHOCARDIOGRAM  08-10-2011   MILD LVH/  EF 55-60%/  NORMAL AVR TISSUE/ MILD MR/  MODERATE DILATED LA/  MILD DILATED RA  . TRANSVAGINAL TAPE PROCEDURE  07-30-2002  . VEIN LIGATION  1969   RIGHT LOWER LEG      Current Outpatient Medications:  .  acetaminophen (TYLENOL) 500 MG tablet, Take 500 mg by mouth every 6 (six) hours as  needed (for pain/fever)., Disp: , Rfl:  .  albuterol (VENTOLIN HFA) 108 (90 Base) MCG/ACT inhaler, Inhale 1 puff into the lungs every 4 (four) hours as needed for wheezing or shortness of breath., Disp: , Rfl:  .  atorvastatin (LIPITOR) 20 MG tablet, Take 20 mg by mouth daily., Disp: , Rfl:  .  busPIRone (BUSPAR) 15 MG tablet, Take 1 tablet (15 mg total) by mouth 2 (two) times daily. PLEASE SCHEDULE AN OFFICE VISIT FOR FURTHER REFILLS: [phone], Disp: 30 tablet, Rfl: 1 .  carboxymethylcellulose 1 % ophthalmic solution, Place 1 drop into both eyes at bedtime., Disp: , Rfl:  .  cycloSPORINE (RESTASIS) 0.05 % ophthalmic emulsion, Place 1 drop into both eyes 2 (two) times daily., Disp: , Rfl:  .  diazepam (VALIUM) 5 MG tablet, Take 5 mg by mouth at bedtime as needed for anxiety., Disp: , Rfl:  .  diltiazem (CARDIZEM) 120 MG tablet, Take 120 mg by mouth 2 (two) times daily., Disp: , Rfl:  .  enoxaparin (LOVENOX) 80 MG/0.8ML injection, Inject 80 mg into the skin daily. To hod on  the 21 and 22 nd- per [place] at [facility], Disp: , Rfl:  .  Eyelid Cleansers (OCUSOFT LID SCRUB EX), Apply 1 application topically 2 (two) times a day., Disp: , Rfl:  .  famotidine (PEPCID) 40 MG tablet, Take 40 mg by mouth at bedtime., Disp: , Rfl:  .  HYDROcodone-acetaminophen (NORCO/VICODIN) 5-325 MG tablet, Take 1 tablet by mouth every 6 (six) hours as needed for severe pain., Disp: 14 tablet, Rfl: 0 .  Multiple Vitamins-Minerals (PRESERVISION AREDS 2 PO), Take 1 tablet by mouth daily., Disp: , Rfl:  .  nitroGLYCERIN (NITROSTAT) 0.4 MG SL tablet, Place 1 tablet (0.4 mg total) under the tongue every 5 (five) minutes as needed for chest pain., Disp: 30 tablet, Rfl: 0 .  ondansetron (ZOFRAN) 4 MG tablet, Take 4 mg by mouth every 4 (four) hours as needed for nausea or vomiting., Disp: , Rfl:  .  oxybutynin (DITROPAN-XL) 5 MG 24 hr tablet, Take 5 mg by mouth 2 (two) times a day., Disp: , Rfl:  .  pantoprazole (PROTONIX) 40 MG  tablet, Take 40 mg by mouth 2 (two) times daily., Disp: , Rfl:  .  Polyvinyl Alcohol-Povidone (REFRESH OP), Place 1 drop into both eyes 2 (two) times daily., Disp: , Rfl:  .  potassium chloride (K-DUR) 10 MEQ tablet, Take 10 mEq by mouth daily. , Disp: , Rfl:  .  torsemide (DEMADEX) 20 MG tablet, Take 20 mg by mouth daily., Disp: , Rfl:  .  warfarin (COUMADIN) 5 MG tablet, Take 5 mg by mouth daily., Disp: , Rfl:    Objective:   Vitals:   01/07/19 1118  BP: 115/71  Pulse: 60  Temp: 97.8 F (36.6 C)  SpO2: 96%    Physical Exam  General: alert, calm, no acute distress HEENT: normocephalic, atraumatic Neck: supple, full ROM Chest: symmetrical rise and fall Lungs: unlabored breathing Musculoskeletal: MAEx4 Neuro: A&O x3, calm, cooperative Skin: 5 mm x 8 mm open wound with granulating tissue, circumferential dry skin and flaking around wound bed      Assessment & Plan:  Open wound of left foot, subsequent encounter  Clyde Kottler is an 85 yo female s/p debridement of left foot with Acell placement on 10/31/18. There has been progress in wound healing over the past month. No signs of infection. Continue collagen dressing changes 2x/week with vaseline and gauze dressings in between. Order written for baths 3x/week. Televisit in 1 month or sooner as needed.   Picture placed in chart with patient's consent.  Alfredo Batty, NP

## 2019-01-08 ENCOUNTER — Telehealth: Payer: Self-pay

## 2019-01-08 ENCOUNTER — Ambulatory Visit (INDEPENDENT_AMBULATORY_CARE_PROVIDER_SITE_OTHER): Payer: Medicare Other | Admitting: Cardiovascular Disease

## 2019-01-08 DIAGNOSIS — Z952 Presence of prosthetic heart valve: Secondary | ICD-10-CM

## 2019-01-08 DIAGNOSIS — Z5181 Encounter for therapeutic drug level monitoring: Secondary | ICD-10-CM | POA: Diagnosis not present

## 2019-01-08 DIAGNOSIS — F411 Generalized anxiety disorder: Secondary | ICD-10-CM | POA: Diagnosis not present

## 2019-01-08 LAB — POCT INR: INR: 4.3 — AB (ref 2.0–3.0)

## 2019-01-08 NOTE — Patient Instructions (Signed)
Description   Spoke with Mo RN [facility] [phone], advise to hold coumadin today and tomorrow, then continue taking 1 tablet daily except 1/2 tablet on Sundays and Thursdays. Recheck INR in 1 week. Orders faxed to [facility] at [phone]. Call with any changes [phone].

## 2019-01-08 NOTE — Telephone Encounter (Signed)
No changes were made to orders yesterday.

## 2019-01-08 NOTE — Telephone Encounter (Signed)
Received call from Mo from [facility]. She is requesting orders for this patient.

## 2019-01-15 ENCOUNTER — Ambulatory Visit (INDEPENDENT_AMBULATORY_CARE_PROVIDER_SITE_OTHER): Payer: Medicare Other | Admitting: Cardiology

## 2019-01-15 DIAGNOSIS — Z952 Presence of prosthetic heart valve: Secondary | ICD-10-CM

## 2019-01-15 DIAGNOSIS — Z5181 Encounter for therapeutic drug level monitoring: Secondary | ICD-10-CM

## 2019-01-15 LAB — POCT INR: INR: 3.6 — AB (ref 2.0–3.0)

## 2019-01-15 NOTE — Patient Instructions (Signed)
Description   Spoke with [facility] RN [facility] # [phone], advise to hold coumadin today, then start taking 1 tablet daily except 1/2 tablet on Sundays, Tuesdays, and Thursdays. Recheck INR in 1 week. Orders faxed to [facility] at [phone]. Call with any changes [phone].

## 2019-01-22 ENCOUNTER — Ambulatory Visit (INDEPENDENT_AMBULATORY_CARE_PROVIDER_SITE_OTHER): Payer: Medicare Other | Admitting: Cardiology

## 2019-01-22 DIAGNOSIS — F411 Generalized anxiety disorder: Secondary | ICD-10-CM | POA: Diagnosis not present

## 2019-01-22 DIAGNOSIS — Z5181 Encounter for therapeutic drug level monitoring: Secondary | ICD-10-CM | POA: Diagnosis not present

## 2019-01-22 DIAGNOSIS — I482 Chronic atrial fibrillation, unspecified: Secondary | ICD-10-CM | POA: Diagnosis not present

## 2019-01-22 LAB — POCT INR: INR: 3.9 — AB (ref 2.0–3.0)

## 2019-01-24 DIAGNOSIS — F411 Generalized anxiety disorder: Secondary | ICD-10-CM | POA: Diagnosis not present

## 2019-01-28 ENCOUNTER — Telehealth: Payer: Self-pay | Admitting: Pharmacist

## 2019-01-28 NOTE — Telephone Encounter (Signed)
Received call from [facility] that they gave pt the wrong dose of warfarin yesterday - 5mg  instead of 2.5mg . Advised them to give 2.5mg  today instead of 5mg  to make up for it. They won't accept verbal orders, wrote a new order for today's dosing and faxed to [facility]. Pt due for INR check tomorrow.

## 2019-01-29 ENCOUNTER — Ambulatory Visit (INDEPENDENT_AMBULATORY_CARE_PROVIDER_SITE_OTHER): Payer: Medicare Other | Admitting: Pharmacist

## 2019-01-29 DIAGNOSIS — I482 Chronic atrial fibrillation, unspecified: Secondary | ICD-10-CM | POA: Diagnosis not present

## 2019-01-29 DIAGNOSIS — Z5181 Encounter for therapeutic drug level monitoring: Secondary | ICD-10-CM

## 2019-01-29 LAB — POCT INR: INR: 3.1 — AB (ref 2.0–3.0)

## 2019-02-02 DIAGNOSIS — I130 Hypertensive heart and chronic kidney disease with heart failure and stage 1 through stage 4 chronic kidney disease, or unspecified chronic kidney disease: Secondary | ICD-10-CM | POA: Diagnosis not present

## 2019-02-02 DIAGNOSIS — Z953 Presence of xenogenic heart valve: Secondary | ICD-10-CM | POA: Diagnosis not present

## 2019-02-02 DIAGNOSIS — M21372 Foot drop, left foot: Secondary | ICD-10-CM | POA: Diagnosis not present

## 2019-02-02 DIAGNOSIS — N183 Chronic kidney disease, stage 3 unspecified: Secondary | ICD-10-CM | POA: Diagnosis not present

## 2019-02-02 DIAGNOSIS — J449 Chronic obstructive pulmonary disease, unspecified: Secondary | ICD-10-CM | POA: Diagnosis not present

## 2019-02-02 DIAGNOSIS — I25119 Atherosclerotic heart disease of native coronary artery with unspecified angina pectoris: Secondary | ICD-10-CM | POA: Diagnosis not present

## 2019-02-02 DIAGNOSIS — F0150 Vascular dementia without behavioral disturbance: Secondary | ICD-10-CM | POA: Diagnosis not present

## 2019-02-02 DIAGNOSIS — Z7901 Long term (current) use of anticoagulants: Secondary | ICD-10-CM | POA: Diagnosis not present

## 2019-02-02 DIAGNOSIS — I69318 Other symptoms and signs involving cognitive functions following cerebral infarction: Secondary | ICD-10-CM | POA: Diagnosis not present

## 2019-02-02 DIAGNOSIS — I5032 Chronic diastolic (congestive) heart failure: Secondary | ICD-10-CM | POA: Diagnosis not present

## 2019-02-02 DIAGNOSIS — J9611 Chronic respiratory failure with hypoxia: Secondary | ICD-10-CM | POA: Diagnosis not present

## 2019-02-02 DIAGNOSIS — I272 Pulmonary hypertension, unspecified: Secondary | ICD-10-CM | POA: Diagnosis not present

## 2019-02-02 DIAGNOSIS — Z5181 Encounter for therapeutic drug level monitoring: Secondary | ICD-10-CM | POA: Diagnosis not present

## 2019-02-02 DIAGNOSIS — Z87891 Personal history of nicotine dependence: Secondary | ICD-10-CM | POA: Diagnosis not present

## 2019-02-02 DIAGNOSIS — Z48817 Encounter for surgical aftercare following surgery on the skin and subcutaneous tissue: Secondary | ICD-10-CM | POA: Diagnosis not present

## 2019-02-02 DIAGNOSIS — I482 Chronic atrial fibrillation, unspecified: Secondary | ICD-10-CM | POA: Diagnosis not present

## 2019-02-04 NOTE — Progress Notes (Signed)
Patient ID: Brittany [name], female    DOB: 12-15-1927, 85 y.o.   MRN: [id number]  The patient gave consent to have this visit done by telemedicine / virtual visit.  Patient did not have access to video, therefore the visit was completed over the phone. This is also consent for access the chart and treat the patient via this visit. The patient is located at her home in  Assisted Living.   I, the provider, am at the office.  We spent 15 minutes together for the visit.   C.C.: wound check  Brittany [name] is an 85 yo female who underwent debridement of left foot with Acell placement on 10/31/18. She states the home health nurse took pictures of the wound today and sent them to the office. Patient states she is unable to see the wound because she is legally blind. Patient states the nurse told her the wound looked better. Patient states the nurse changes the dressing with Collagen and gauze dressings every Tuesday and Thursday. The Assisted Living aids apply vaseline and gauze to the wound the other days. Patient states she is doing very well otherwise.   Patient requests to call her daughter to schedule a follow up visit. Patient states her daughter is currently helping her daughter in [place] and is very busy. Patient requests to not disturb her daughter this week and to call next week for scheduling.   Review of Systems  Constitutional: Negative.   HENT: Negative.   Respiratory: Negative.   Cardiovascular: Negative.   Gastrointestinal: Negative.   Genitourinary: Negative.   Musculoskeletal: Negative.   Skin: Positive for wound.  Neurological: Negative.     Past Medical History:  Diagnosis Date   Abnormal liver diagnostic imaging    suspected cirrhosis   Arthritis    Atrial fibrillation ([place])    Blind    legally   Chronic back pain    COPD (chronic obstructive pulmonary disease) (HCC)    Diverticulosis    Dyslipidemia    takes Lipitor daily   Dysrhythmia    Atrial  Fibrillation   Foot drop    SINCE 1987   GERD (gastroesophageal reflux disease)    H/O hiatal hernia    Hearing impaired    Bilateral hearing aids   History of bacterial endocarditis    History of gastritis    History of stomach ulcers    many yrs ago   History of TIA (transient ischemic attack)    2013-- RESIDUAL PERIPHERAL VISION RIGHT EYE--  RESOLVED   Hypertension    IC (interstitial cystitis)    Macular degeneration    Myocardial infarction [facility])    Pulmonary hypertension ([place]) 10/2013   Based on TTE   S/P aortic valve replacement    2005   Stroke ([place])    TIA, no preherial vison    Urine incontinence    Wears dentures    Wears glasses     Past Surgical History:  Procedure Laterality Date   ABDOMINAL HYSTERECTOMY  1967   AORTIC VALVE REPLACEMENT  09-29-2003    [place] pericardial tissue valve   APPENDECTOMY  123456   APPLICATION OF A-CELL OF EXTREMITY Left 09/06/2018   Procedure: DEBRIDMENT LEFT FOOT WITH ACELL PLACEMENT;  Surgeon: Wallace Going, DO;  Location: [place];  Service: Plastics;  Laterality: Left;   APPLICATION OF A-CELL OF EXTREMITY Left 10/31/2018   Procedure: APPLICATION OF A-CELL OF EXTREMITY;  Surgeon: Wallace Going, DO;  Location: [place];  Service: Plastics;  Laterality: Left;   BIOPSY  02/01/2018   Procedure: BIOPSY;  Surgeon: Rush Landmark Telford Nab., MD;  Location: [place];  Service: Gastroenterology;;   CARDIAC CATHETERIZATION  07-15-2003 DR HELEN PRESTON   MODERATE TO MODERATELY SEVERE CALCIFIC AORTIC STENOSIS/  NORMAL CORONARY ARTERIES   CARDIOVASCULAR STRESS TEST  01-30-2012  DR NASHER   NORMAL NUCLEAR STUDY/  EF 73%/  NORMAL LVF   CARPAL TUNNEL RELEASE Bilateral    CATARACT EXTRACTION W/ INTRAOCULAR LENS  IMPLANT, BILATERAL     CHOLECYSTECTOMY  1993   CYSTO WITH HYDRODISTENSION N/A 02/05/2013   Procedure: CYSTOSCOPY/HYDRODISTENSION;  Surgeon: Irine Seal, MD;  Location: [facility];  Service: Urology;  Laterality: N/A;   CYSTO/ HYDRODISTENTION/ INSTILLATION CLORPACTIN  MULTIPLE  last one 2009   DILATION AND CURETTAGE OF UTERUS     ERCP N/A 08/10/2012   Procedure: ENDOSCOPIC RETROGRADE CHOLANGIOPANCREATOGRAPHY (ERCP);  Surgeon: Inda Castle, MD;  Location: [place];  Service: Gastroenterology;  Laterality: N/A;   ESOPHAGOGASTRODUODENOSCOPY (EGD) WITH PROPOFOL N/A 08/18/2014   Procedure: ESOPHAGOGASTRODUODENOSCOPY (EGD) WITH PROPOFOL;  Surgeon: Inda Castle, MD;  Location: [facility];  Service: Endoscopy;  Laterality: N/A;   ESOPHAGOGASTRODUODENOSCOPY (EGD) WITH PROPOFOL N/A 02/01/2018   Procedure: ESOPHAGOGASTRODUODENOSCOPY (EGD) WITH PROPOFOL;  Surgeon: Rush Landmark Telford Nab., MD;  Location: [place];  Service: Gastroenterology;  Laterality: N/A;   FOREIGN BODY REMOVAL  02/01/2018   Procedure: FOREIGN BODY REMOVAL;  Surgeon: Rush Landmark Telford Nab., MD;  Location: [place];  Service: Gastroenterology;;   INCISION AND DRAINAGE OF WOUND Left 10/31/2018   Procedure: DEBRIDEMENT LEFT FOOT WOUND;  Surgeon: Wallace Going, DO;  Location: [place];  Service: Plastics;  Laterality: Left;   LEFT HEART CATHETERIZATION WITH CORONARY ANGIOGRAM N/A 10/18/2013   Procedure: LEFT HEART CATHETERIZATION WITH CORONARY ANGIOGRAM;  Surgeon: Jettie Booze, MD;  Location: [facility] CATH LAB;  Service: Cardiovascular;  Laterality: N/A;   [place]  2007   MACROPLASTIQUE URETHRAL IMPLANTATION  08-27-2009   MINI-OPEN RIGHT ROTATOR CUFF REPAIR  07-12-2001   RIGHT SHOULDER ARTHROSCOPY W/ DEBRIDEMENT ROTATOR CUFF AND LABRAL TEAR/ ACROMINOPLASTY/ DISTAL CLAVICLE EXCISION/ CA LIGAMENT RELEASE  10-08-1999   TEE WITHOUT CARDIOVERSION N/A 06/20/2018   Procedure: TRANSESOPHAGEAL ECHOCARDIOGRAM (TEE);  Surgeon: Jerline Pain, MD;  Location: [facility] ENDOSCOPY;  Service: Cardiovascular;  Laterality: N/A;   TONSILLECTOMY AND ADENOIDECTOMY     TRANSTHORACIC  ECHOCARDIOGRAM  08-10-2011   MILD LVH/  EF 55-60%/  NORMAL AVR TISSUE/ MILD MR/  MODERATE DILATED LA/  MILD DILATED RA   TRANSVAGINAL TAPE PROCEDURE  07-30-2002   VEIN LIGATION  1969   RIGHT LOWER LEG      Current Outpatient Medications:    acetaminophen (TYLENOL) 500 MG tablet, Take 500 mg by mouth every 6 (six) hours as needed (for pain/fever)., Disp: , Rfl:    albuterol (VENTOLIN HFA) 108 (90 Base) MCG/ACT inhaler, Inhale 1 puff into the lungs every 4 (four) hours as needed for wheezing or shortness of breath., Disp: , Rfl:    atorvastatin (LIPITOR) 20 MG tablet, Take 20 mg by mouth daily., Disp: , Rfl:    busPIRone (BUSPAR) 15 MG tablet, Take 1 tablet (15 mg total) by mouth 2 (two) times daily. PLEASE SCHEDULE AN OFFICE VISIT FOR FURTHER REFILLS: [phone], Disp: 30 tablet, Rfl: 1   carboxymethylcellulose 1 % ophthalmic solution, Place 1 drop into both eyes at bedtime., Disp: , Rfl:    cycloSPORINE (RESTASIS) 0.05 %  ophthalmic emulsion, Place 1 drop into both eyes 2 (two) times daily., Disp: , Rfl:    diazepam (VALIUM) 5 MG tablet, Take 5 mg by mouth at bedtime as needed for anxiety., Disp: , Rfl:    diltiazem (CARDIZEM) 120 MG tablet, Take 120 mg by mouth 2 (two) times daily., Disp: , Rfl:    enoxaparin (LOVENOX) 80 MG/0.8ML injection, Inject 80 mg into the skin daily. To hod on the 21 and 22 nd- per Jenny Reichmann at [facility], Disp: , Rfl:    Eyelid Cleansers (OCUSOFT LID SCRUB EX), Apply 1 application topically 2 (two) times a day., Disp: , Rfl:    famotidine (PEPCID) 40 MG tablet, Take 40 mg by mouth at bedtime., Disp: , Rfl:    HYDROcodone-acetaminophen (NORCO/VICODIN) 5-325 MG tablet, Take 1 tablet by mouth every 6 (six) hours as needed for severe pain., Disp: 14 tablet, Rfl: 0   Multiple Vitamins-Minerals (PRESERVISION AREDS 2 PO), Take 1 tablet by mouth daily., Disp: , Rfl:    nitroGLYCERIN (NITROSTAT) 0.4 MG SL tablet, Place 1 tablet (0.4 mg total) under the tongue  every 5 (five) minutes as needed for chest pain., Disp: 30 tablet, Rfl: 0   ondansetron (ZOFRAN) 4 MG tablet, Take 4 mg by mouth every 4 (four) hours as needed for nausea or vomiting., Disp: , Rfl:    oxybutynin (DITROPAN-XL) 5 MG 24 hr tablet, Take 5 mg by mouth 2 (two) times a day., Disp: , Rfl:    pantoprazole (PROTONIX) 40 MG tablet, Take 40 mg by mouth 2 (two) times daily., Disp: , Rfl:    Polyvinyl Alcohol-Povidone (REFRESH OP), Place 1 drop into both eyes 2 (two) times daily., Disp: , Rfl:    potassium chloride (K-DUR) 10 MEQ tablet, Take 10 mEq by mouth daily. , Disp: , Rfl:    torsemide (DEMADEX) 20 MG tablet, Take 20 mg by mouth daily., Disp: , Rfl:    warfarin (COUMADIN) 5 MG tablet, Take 5 mg by mouth daily., Disp: , Rfl:    Objective:   There were no vitals filed for this visit due to virtual visit. The physical exam was limited due to virtual visit. The wound pictures and measurements were obtained from the home health nurse.   Physical Exam  Skin: left foot wound Length 0.5 cm x Width 0.5 cm x Depth 0.1 cm   Assessment & Plan:  Open wound of left foot, subsequent encounter  Brittany [name] is an 85 yo female s/p debridement of left foot with Acell placement on 10/31/18. Wound measurements and picture were received. It was difficult to fully visualize the wound on the picture.Per measurements, the wound has decreased in size. Continue collagen dressing changes 2x/week with vaseline and gauze dressings in between. Return to office in 1 month. Per patient request, will call daughter next week to schedule visit.    Alfredo Batty, NP

## 2019-02-05 ENCOUNTER — Encounter: Payer: Self-pay | Admitting: Nurse Practitioner

## 2019-02-05 ENCOUNTER — Ambulatory Visit (INDEPENDENT_AMBULATORY_CARE_PROVIDER_SITE_OTHER): Payer: Medicare Other

## 2019-02-05 ENCOUNTER — Ambulatory Visit (INDEPENDENT_AMBULATORY_CARE_PROVIDER_SITE_OTHER): Payer: Medicare Other | Admitting: Nurse Practitioner

## 2019-02-05 ENCOUNTER — Non-Acute Institutional Stay: Payer: Medicare Other | Admitting: Licensed Clinical Social Worker

## 2019-02-05 DIAGNOSIS — I214 Non-ST elevation (NSTEMI) myocardial infarction: Secondary | ICD-10-CM

## 2019-02-05 DIAGNOSIS — I482 Chronic atrial fibrillation, unspecified: Secondary | ICD-10-CM

## 2019-02-05 DIAGNOSIS — I130 Hypertensive heart and chronic kidney disease with heart failure and stage 1 through stage 4 chronic kidney disease, or unspecified chronic kidney disease: Secondary | ICD-10-CM | POA: Diagnosis not present

## 2019-02-05 DIAGNOSIS — Z5181 Encounter for therapeutic drug level monitoring: Secondary | ICD-10-CM

## 2019-02-05 DIAGNOSIS — I25119 Atherosclerotic heart disease of native coronary artery with unspecified angina pectoris: Secondary | ICD-10-CM | POA: Diagnosis not present

## 2019-02-05 DIAGNOSIS — Z48817 Encounter for surgical aftercare following surgery on the skin and subcutaneous tissue: Secondary | ICD-10-CM | POA: Diagnosis not present

## 2019-02-05 DIAGNOSIS — I69318 Other symptoms and signs involving cognitive functions following cerebral infarction: Secondary | ICD-10-CM | POA: Diagnosis not present

## 2019-02-05 DIAGNOSIS — F411 Generalized anxiety disorder: Secondary | ICD-10-CM | POA: Diagnosis not present

## 2019-02-05 DIAGNOSIS — S91302D Unspecified open wound, left foot, subsequent encounter: Secondary | ICD-10-CM

## 2019-02-05 DIAGNOSIS — F0150 Vascular dementia without behavioral disturbance: Secondary | ICD-10-CM | POA: Diagnosis not present

## 2019-02-05 LAB — POCT INR: INR: 2.7 (ref 2.0–3.0)

## 2019-02-05 NOTE — Patient Instructions (Signed)
Description   Spoke with [facility] RN while with pt, advised to have pt continue on same dosage 1/2 tablet daily except 1 tablet on Mondays, Wednesdays, Fridays. Recheck INR in 2 weeks. Orders faxed to [facility] at [phone]. Call with any changes [phone].

## 2019-02-06 DIAGNOSIS — N3281 Overactive bladder: Secondary | ICD-10-CM | POA: Diagnosis not present

## 2019-02-06 DIAGNOSIS — I10 Essential (primary) hypertension: Secondary | ICD-10-CM | POA: Diagnosis not present

## 2019-02-06 DIAGNOSIS — R300 Dysuria: Secondary | ICD-10-CM | POA: Diagnosis not present

## 2019-02-06 DIAGNOSIS — I482 Chronic atrial fibrillation, unspecified: Secondary | ICD-10-CM | POA: Diagnosis not present

## 2019-02-06 NOTE — Progress Notes (Signed)
COMMUNITY PALLIATIVE CARE SW NOTE  PATIENT NAME: Brittany [name] DOB: 06-02-1927 MRN: [id number]  PRIMARY CARE PROVIDER: Mayra Neer, MD  RESPONSIBLE PARTY:  Acct ID - Guarantor Home Phone Work Phone Relationship Acct Type  [id number] Brittany [name]* [phone]  Self P/F     [facility], [place], [address], [place],*     PLAN OF CARE and INTERVENTIONS:             1. GOALS OF CARE/ ADVANCE CARE PLANNING:  Patient's goal is for her left foot to heal so that she can walk on it comfortably.  She has a DNR. 2. SOCIAL/EMOTIONAL/SPIRITUAL ASSESSMENT/ INTERVENTIONS:  SW met with patient in her apartment at [facility].  She was alert and oriented.  She had her left foot elevated.  Patient reports her foot is healing.  She said it had been a long day since the nurse did a dressing change today and she had a bath.  She denied pain.  Patient continues to participate in facility activities. 3. PATIENT/CAREGIVER EDUCATION/ COPING:  Patient copes by focusing on the positive aspects of her situation. 4. PERSONAL EMERGENCY PLAN:  Per facility protocol. 5. COMMUNITY RESOURCES COORDINATION/ HEALTH CARE NAVIGATION:  [facility] RN changes the dressing on her left foot. 6. FINANCIAL/LEGAL CONCERNS/INTERVENTIONS:  None.     SOCIAL HX:  Social History   Tobacco Use  . Smoking status: Former Smoker    Packs/day: 0.50    Years: 40.00    Pack years: 20.00    Types: Cigarettes    Start date: 05/06/1949    Quit date: 03/06/1990    Years since quitting: 28.9  . Smokeless tobacco: Never Used  Substance Use Topics  . Alcohol use: No    Alcohol/week: 0.0 standard drinks    Comment: Remote social EtOH    CODE STATUS:  DNR  ADVANCED DIRECTIVES: N MOST FORM COMPLETE:  N HOSPICE EDUCATION PROVIDED:  N PPS:  Her appetite is normal.  She uses a walker to ambulate. Duration of visit and documentation:  30 minutes.      Brittany Corn Ishika [place], LCSW

## 2019-02-07 DIAGNOSIS — N390 Urinary tract infection, site not specified: Secondary | ICD-10-CM | POA: Diagnosis not present

## 2019-02-07 DIAGNOSIS — F411 Generalized anxiety disorder: Secondary | ICD-10-CM | POA: Diagnosis not present

## 2019-02-12 DIAGNOSIS — F411 Generalized anxiety disorder: Secondary | ICD-10-CM | POA: Diagnosis not present

## 2019-02-13 DIAGNOSIS — I739 Peripheral vascular disease, unspecified: Secondary | ICD-10-CM | POA: Diagnosis not present

## 2019-02-13 DIAGNOSIS — B351 Tinea unguium: Secondary | ICD-10-CM | POA: Diagnosis not present

## 2019-02-13 DIAGNOSIS — Q845 Enlarged and hypertrophic nails: Secondary | ICD-10-CM | POA: Diagnosis not present

## 2019-02-13 DIAGNOSIS — G894 Chronic pain syndrome: Secondary | ICD-10-CM | POA: Diagnosis not present

## 2019-02-13 DIAGNOSIS — R300 Dysuria: Secondary | ICD-10-CM | POA: Diagnosis not present

## 2019-02-13 DIAGNOSIS — L603 Nail dystrophy: Secondary | ICD-10-CM | POA: Diagnosis not present

## 2019-02-15 DIAGNOSIS — I10 Essential (primary) hypertension: Secondary | ICD-10-CM | POA: Diagnosis not present

## 2019-02-15 DIAGNOSIS — I5042 Chronic combined systolic (congestive) and diastolic (congestive) heart failure: Secondary | ICD-10-CM | POA: Diagnosis not present

## 2019-02-15 DIAGNOSIS — K219 Gastro-esophageal reflux disease without esophagitis: Secondary | ICD-10-CM | POA: Diagnosis not present

## 2019-02-18 ENCOUNTER — Telehealth: Payer: Self-pay

## 2019-02-18 NOTE — Telephone Encounter (Signed)
Called and spoke with Mo,RN with [facility] regarding the message below.  Informed her of wound care orders from May[place],NP from the virtual visit on (02/05/19) with the patient.  Continue collagen dressing changes 2x/week with vaseline and gauze dressings in between.  Mo,RN verbalized understanding and agreed.//AB/CMA

## 2019-02-18 NOTE — Telephone Encounter (Signed)
Mo, RN with [facility] called regarding patient. Mo is requesting order clarification. Please contact at [phone]

## 2019-02-19 ENCOUNTER — Telehealth: Payer: Self-pay | Admitting: *Deleted

## 2019-02-19 ENCOUNTER — Ambulatory Visit (INDEPENDENT_AMBULATORY_CARE_PROVIDER_SITE_OTHER): Payer: Medicare Other | Admitting: Internal Medicine

## 2019-02-19 DIAGNOSIS — I482 Chronic atrial fibrillation, unspecified: Secondary | ICD-10-CM

## 2019-02-19 DIAGNOSIS — Z5181 Encounter for therapeutic drug level monitoring: Secondary | ICD-10-CM

## 2019-02-19 DIAGNOSIS — F411 Generalized anxiety disorder: Secondary | ICD-10-CM | POA: Diagnosis not present

## 2019-02-19 LAB — POCT INR: INR: 3.2 — AB (ref 2.0–3.0)

## 2019-02-19 NOTE — Telephone Encounter (Signed)
Received Home Health Certification and Plan of Care on (02/14/19) via fax from [facility].  Requesting to be signed and returned.  Form was signed and faxed on (02/15/19).  Confirmation received.//AB/CMA

## 2019-02-19 NOTE — Patient Instructions (Signed)
Description   Spoke with [facility] LPN while with pt, advised to have pt hold today's dose then continue on same dosage 1/2 tablet daily except 1 tablet on Mondays, Wednesdays, Fridays. Recheck INR in 9 days-due to recertification. Orders faxed to [facility] at [phone]. Call with any changes [phone].

## 2019-02-21 ENCOUNTER — Other Ambulatory Visit: Payer: Self-pay

## 2019-02-21 ENCOUNTER — Non-Acute Institutional Stay: Payer: Medicare Other | Admitting: *Deleted

## 2019-02-21 ENCOUNTER — Non-Acute Institutional Stay: Payer: Medicare Other | Admitting: Licensed Clinical Social Worker

## 2019-02-21 DIAGNOSIS — Z515 Encounter for palliative care: Secondary | ICD-10-CM

## 2019-02-21 NOTE — Progress Notes (Signed)
COMMUNITY PALLIATIVE CARE SW NOTE  PATIENT NAME: Brittany [name] DOB: [birth year] MRN: [id number]  PRIMARY CARE PROVIDER: Mayra Neer, MD  RESPONSIBLE PARTY:  Acct ID - Guarantor Home Phone Work Phone Relationship Acct Type  [id number] Brittany [name], Brittany [name]* [phone]  Self P/F     [facility], [place], [address], [place],*     PLAN OF CARE and INTERVENTIONS:             1. GOALS OF CARE/ ADVANCE CARE PLANNING:  Goal is for patient to be able to walk on her left foot comfortably.  Patient has a DNR. 2. SOCIAL/EMOTIONAL/SPIRITUAL ASSESSMENT/ INTERVENTIONS:  SW and Palliative Care RN, Daryl Eastern, met with patient in her room at [facility].  She was sitting in a chair with her left leg elevated.  She displayed a bright affect and was alert and oriented.  She discussed her son requiring open heart surgery.  She reported that her left foot was almost healed.  Patient enjoys her living situation and attends facility activities. 3. PATIENT/CAREGIVER EDUCATION/ COPING:  Patient copes by focusing on her positive situation. 4. PERSONAL EMERGENCY PLAN:  Per facility protocol. 5. COMMUNITY RESOURCES COORDINATION/ HEALTH CARE NAVIGATION:  [facility] changes patient's dressing on her left foot. 6. FINANCIAL/LEGAL CONCERNS/INTERVENTIONS:  None.     SOCIAL HX:  Social History   Tobacco Use  . Smoking status: Former Smoker    Packs/day: 0.50    Years: 40.00    Pack years: 20.00    Types: Cigarettes    Start date: 05/06/1949    Quit date: 03/06/1990    Years since quitting: 28.9  . Smokeless tobacco: Never Used  Substance Use Topics  . Alcohol use: No    Alcohol/week: 0.0 standard drinks    Comment: Remote social EtOH    CODE STATUS:  DNR ADVANCED DIRECTIVES: N MOST FORM COMPLETE:  N HOSPICE EDUCATION PROVIDED:  N PPS:  Patient's appetite is normal.  She ambulates with a walker. Duration of visit and documentation: 30 minutes.      Creola Corn Dejaun Vidrio,  LCSW

## 2019-02-25 NOTE — Progress Notes (Signed)
COMMUNITY PALLIATIVE CARE RN NOTE  PATIENT NAME: Brittany [name] DOB: 12-Feb-1928 MRN: [id number]  PRIMARY CARE PROVIDER: Mayra Neer, MD  RESPONSIBLE PARTY:  Acct ID - Guarantor Home Phone Work Phone Relationship Acct Type  [id number] [name], [name] [phone]  Self P/F     [facility], [place], [address], [place],*   Covid-19 Pre-screening Negative  PLAN OF CARE and INTERVENTION:  1. ADVANCE CARE PLANNING/GOALS OF CARE: Goal is to remain at current facility and avoid hospitalizations. She has a DNR.  2. PATIENT/CAREGIVER EDUCATION: N/A 3. DISEASE STATUS: Joint visit made with Palliative care SW, Lynn Duffy. Met with patient in her room at the facility. She is sitting up in her recliner awake and alert. Pleasant mood and engaging. She reports mild pain in her L rib area after performing some exercises, but says it is better today. She does not feel that she requires any pain medication. No dyspnea noted. She is independent with ADLs. She ambulates using a walker, but does not walk long d/t her left foot wound. The wound to her left foot is almost completely healed. She says she has been seeing a Psychiatric nurse and she was able to get her wound to heal. Patient has been dealing with this wound for a year. It is now the size of a pin-point and she is pleased. She keeps her leg elevated as instructed. No swelling noted. [facility] home health RN manages dressing changes. Her intake is good. She says that she really enjoys being at this facility. She is continent of both bowel and bladder. She feels that she is doing well. Will continue to monitor.   HISTORY OF PRESENT ILLNESS:  This is a 85 yo female who resides at [facility]. Palliative care team continues to follow patient. Will continue to visit monthly and PRN.  CODE STATUS: DNR ADVANCED DIRECTIVES: Y MOST FORM: no PPS: 50%   PHYSICAL EXAM:   LUNGS: clear to auscultation  CARDIAC: Cor  RRR EXTREMITIES: No edema SKIN: Exposed skin is dry and intact. Small pin-point wound on left foot  NEURO: Alert and oriented x 3, pleasant mood, ambulatory with walker   (Duration of visit and documentation 45 minutes)   Daryl Eastern, RN, BSN

## 2019-02-26 ENCOUNTER — Ambulatory Visit (INDEPENDENT_AMBULATORY_CARE_PROVIDER_SITE_OTHER): Payer: Medicare Other | Admitting: Nurse Practitioner

## 2019-02-26 ENCOUNTER — Encounter: Payer: Self-pay | Admitting: Nurse Practitioner

## 2019-02-26 ENCOUNTER — Other Ambulatory Visit: Payer: Self-pay

## 2019-02-26 VITALS — BP 143/80 | HR 58 | Temp 97.1°F

## 2019-02-26 DIAGNOSIS — S91302D Unspecified open wound, left foot, subsequent encounter: Secondary | ICD-10-CM

## 2019-02-26 DIAGNOSIS — I214 Non-ST elevation (NSTEMI) myocardial infarction: Secondary | ICD-10-CM

## 2019-02-26 NOTE — Progress Notes (Signed)
Patient ID: Brittany [name], female    DOB: [birth year], 85 y.o.   MRN: [id number]   C.C.: left foot wound  Brittany [name] is an 85 yo female who underwent debridement of left foot with Acell placement on 10/31/18. She presents today for wound follow up. Last wound measurements were Length 0.5 cm x Width 0.5 cm x Depth 0.1 cm. With the assistance of home health nursing, she has been applying collagen dressing changes 2x/week and vaseline and gauze dressings in between. Patient states she is "doing great" and is very happy with her wound healing. Patient states she is able to walk on the left foot without pain. Patient states she is beginning to walk more with her walker. Patient has foot drop and is hopeful she will be able to wear a foot brace soon. Patient has had the same hard foot brace for 35 years. Patient's daughter plans to buy a softer foot brace.   Review of Systems  Constitutional: Positive for activity change.  HENT:       Hard of hearing  Eyes:       Impaired vision  Respiratory: Negative.   Cardiovascular: Negative.   Gastrointestinal: Negative.   Genitourinary: Negative.   Musculoskeletal: Negative.   Skin: Positive for wound.  Neurological: Negative.     Past Medical History:  Diagnosis Date   Abnormal liver diagnostic imaging    suspected cirrhosis   Arthritis    Atrial fibrillation ([place])    Blind    legally   Chronic back pain    COPD (chronic obstructive pulmonary disease) (HCC)    Diverticulosis    Dyslipidemia    takes Lipitor daily   Dysrhythmia    Atrial Fibrillation   Foot drop    SINCE 1987   GERD (gastroesophageal reflux disease)    H/O hiatal hernia    Hearing impaired    Bilateral hearing aids   History of bacterial endocarditis    History of gastritis    History of stomach ulcers    many yrs ago   History of TIA (transient ischemic attack)    2013-- RESIDUAL PERIPHERAL VISION RIGHT EYE--  RESOLVED   Hypertension    IC  (interstitial cystitis)    Macular degeneration    Myocardial infarction [facility])    Pulmonary hypertension ([place]) 10/2013   Based on TTE   S/P aortic valve replacement    2005   Stroke ([place])    TIA, no preherial vison    Urine incontinence    Wears dentures    Wears glasses     Past Surgical History:  Procedure Laterality Date   ABDOMINAL HYSTERECTOMY  1967   AORTIC VALVE REPLACEMENT  09-29-2003    Optima pericardial tissue valve   APPENDECTOMY  123456   APPLICATION OF A-CELL OF EXTREMITY Left 09/06/2018   Procedure: DEBRIDMENT LEFT FOOT WITH ACELL PLACEMENT;  Surgeon: Wallace Going, DO;  Location: [place];  Service: Plastics;  Laterality: Left;   APPLICATION OF A-CELL OF EXTREMITY Left 10/31/2018   Procedure: APPLICATION OF A-CELL OF EXTREMITY;  Surgeon: Wallace Going, DO;  Location: [place];  Service: Plastics;  Laterality: Left;   BIOPSY  02/01/2018   Procedure: BIOPSY;  Surgeon: Rush Landmark Telford Nab., MD;  Location: [place];  Service: Gastroenterology;;   CARDIAC CATHETERIZATION  07-15-2003 DR HELEN PRESTON   MODERATE TO MODERATELY SEVERE CALCIFIC AORTIC STENOSIS/  NORMAL CORONARY ARTERIES   CARDIOVASCULAR STRESS TEST  01-30-2012  DR Cathie Olden   NORMAL NUCLEAR STUDY/  EF 73%/  NORMAL LVF   CARPAL TUNNEL RELEASE Bilateral    CATARACT EXTRACTION W/ INTRAOCULAR LENS  IMPLANT, BILATERAL     CHOLECYSTECTOMY  1993   CYSTO WITH HYDRODISTENSION N/A 02/05/2013   Procedure: CYSTOSCOPY/HYDRODISTENSION;  Surgeon: Irine Seal, MD;  Location: [facility];  Service: Urology;  Laterality: N/A;   CYSTO/ HYDRODISTENTION/ INSTILLATION CLORPACTIN  MULTIPLE  last one 2009   DILATION AND CURETTAGE OF UTERUS     ERCP N/A 08/10/2012   Procedure: ENDOSCOPIC RETROGRADE CHOLANGIOPANCREATOGRAPHY (ERCP);  Surgeon: Inda Castle, MD;  Location: [place];  Service: Gastroenterology;  Laterality: N/A;   ESOPHAGOGASTRODUODENOSCOPY (EGD) WITH PROPOFOL  N/A 08/18/2014   Procedure: ESOPHAGOGASTRODUODENOSCOPY (EGD) WITH PROPOFOL;  Surgeon: Inda Castle, MD;  Location: [facility];  Service: Endoscopy;  Laterality: N/A;   ESOPHAGOGASTRODUODENOSCOPY (EGD) WITH PROPOFOL N/A 02/01/2018   Procedure: ESOPHAGOGASTRODUODENOSCOPY (EGD) WITH PROPOFOL;  Surgeon: Rush Landmark Telford Nab., MD;  Location: [place];  Service: Gastroenterology;  Laterality: N/A;   FOREIGN BODY REMOVAL  02/01/2018   Procedure: FOREIGN BODY REMOVAL;  Surgeon: Rush Landmark Telford Nab., MD;  Location: [place];  Service: Gastroenterology;;   INCISION AND DRAINAGE OF WOUND Left 10/31/2018   Procedure: DEBRIDEMENT LEFT FOOT WOUND;  Surgeon: Wallace Going, DO;  Location: [place];  Service: Plastics;  Laterality: Left;   LEFT HEART CATHETERIZATION WITH CORONARY ANGIOGRAM N/A 10/18/2013   Procedure: LEFT HEART CATHETERIZATION WITH CORONARY ANGIOGRAM;  Surgeon: Jettie Booze, MD;  Location: [facility] CATH LAB;  Service: Cardiovascular;  Laterality: N/A;   [place]  2007   MACROPLASTIQUE URETHRAL IMPLANTATION  08-27-2009   MINI-OPEN RIGHT ROTATOR CUFF REPAIR  07-12-2001   RIGHT SHOULDER ARTHROSCOPY W/ DEBRIDEMENT ROTATOR CUFF AND LABRAL TEAR/ ACROMINOPLASTY/ DISTAL CLAVICLE EXCISION/ CA LIGAMENT RELEASE  10-08-1999   TEE WITHOUT CARDIOVERSION N/A 06/20/2018   Procedure: TRANSESOPHAGEAL ECHOCARDIOGRAM (TEE);  Surgeon: Jerline Pain, MD;  Location: [facility] ENDOSCOPY;  Service: Cardiovascular;  Laterality: N/A;   TONSILLECTOMY AND ADENOIDECTOMY     TRANSTHORACIC ECHOCARDIOGRAM  08-10-2011   MILD LVH/  EF 55-60%/  NORMAL AVR TISSUE/ MILD MR/  MODERATE DILATED LA/  MILD DILATED RA   TRANSVAGINAL TAPE PROCEDURE  07-30-2002   VEIN LIGATION  1969   RIGHT LOWER LEG      Current Outpatient Medications:    acetaminophen (TYLENOL) 500 MG tablet, Take 500 mg by mouth every 6 (six) hours as needed (for pain/fever)., Disp: , Rfl:    albuterol (VENTOLIN HFA)  108 (90 Base) MCG/ACT inhaler, Inhale 1 puff into the lungs every 4 (four) hours as needed for wheezing or shortness of breath., Disp: , Rfl:    atorvastatin (LIPITOR) 20 MG tablet, Take 20 mg by mouth daily., Disp: , Rfl:    busPIRone (BUSPAR) 15 MG tablet, Take 1 tablet (15 mg total) by mouth 2 (two) times daily. PLEASE SCHEDULE AN OFFICE VISIT FOR FURTHER REFILLS: [phone], Disp: 30 tablet, Rfl: 1   carboxymethylcellulose 1 % ophthalmic solution, Place 1 drop into both eyes at bedtime., Disp: , Rfl:    cycloSPORINE (RESTASIS) 0.05 % ophthalmic emulsion, Place 1 drop into both eyes 2 (two) times daily., Disp: , Rfl:    diazepam (VALIUM) 5 MG tablet, Take 5 mg by mouth at bedtime as needed for anxiety., Disp: , Rfl:    diltiazem (CARDIZEM) 120 MG tablet, Take 120 mg by mouth 2 (two) times daily., Disp: , Rfl:  enoxaparin (LOVENOX) 80 MG/0.8ML injection, Inject 80 mg into the skin daily. To hod on the 21 and 22 nd- per Jenny Reichmann at [facility], Disp: , Rfl:    Eyelid Cleansers (OCUSOFT LID SCRUB EX), Apply 1 application topically 2 (two) times a day., Disp: , Rfl:    famotidine (PEPCID) 40 MG tablet, Take 40 mg by mouth at bedtime., Disp: , Rfl:    HYDROcodone-acetaminophen (NORCO/VICODIN) 5-325 MG tablet, Take 1 tablet by mouth every 6 (six) hours as needed for severe pain., Disp: 14 tablet, Rfl: 0   Multiple Vitamins-Minerals (PRESERVISION AREDS 2 PO), Take 1 tablet by mouth daily., Disp: , Rfl:    nitroGLYCERIN (NITROSTAT) 0.4 MG SL tablet, Place 1 tablet (0.4 mg total) under the tongue every 5 (five) minutes as needed for chest pain., Disp: 30 tablet, Rfl: 0   ondansetron (ZOFRAN) 4 MG tablet, Take 4 mg by mouth every 4 (four) hours as needed for nausea or vomiting., Disp: , Rfl:    oxybutynin (DITROPAN-XL) 5 MG 24 hr tablet, Take 5 mg by mouth 2 (two) times a day., Disp: , Rfl:    pantoprazole (PROTONIX) 40 MG tablet, Take 40 mg by mouth 2 (two) times daily., Disp: , Rfl:     Polyvinyl Alcohol-Povidone (REFRESH OP), Place 1 drop into both eyes 2 (two) times daily., Disp: , Rfl:    potassium chloride (K-DUR) 10 MEQ tablet, Take 10 mEq by mouth daily. , Disp: , Rfl:    torsemide (DEMADEX) 20 MG tablet, Take 20 mg by mouth daily., Disp: , Rfl:    warfarin (COUMADIN) 5 MG tablet, Take 5 mg by mouth daily., Disp: , Rfl:    Objective:   Vitals:   02/26/19 1315  BP: (!) 143/80  Pulse: (!) 58  Temp: (!) 97.1 F (36.2 C)  SpO2: 93%    Physical Exam  General: alert, calm, no acute distress HEENT: normocephalic Neck: full ROM Chest: symmetrical rise and fall Lungs: unlabored breathing Cardiac: +2 bilateral radial pulse Abdomen: soft, non-distended Musculoskeletal: MAEx4 Neuro: A&O x3, calm, cooperative Extremities: left foot drop Skin: 5 mm x 4 mm x 1 mm open wound on left lateral foot with small piece of hard dried skin partially detached from wound bed, no erythema or drainage   Assessment & Plan:  Open wound of left foot, subsequent encounter  Brittany [name] is an 85 yo female s/p debridement of left foot with Acell placement on 10/31/18. The wound bed continues to decrease in size. A portion of dead skin was excised from the wound bed. Continue collagen dressing 2x/week and vaseline with gauze dressings in between. We placed the hard foot brace against the foot to assess comfort. The edge of the brace rubbed against the wound and caused pain. I advised to avoid the current foot brace until the wound completely healed. Patient's daughter may be able to find a softer brace with less pressure against the wound. The wound was dressed with vaseline, gauze, and ace wrap. Televisit in 4 weeks.   Picture placed in chart with patient's consent.  Alfredo Batty, NP

## 2019-02-27 ENCOUNTER — Telehealth: Payer: Self-pay

## 2019-02-27 DIAGNOSIS — J449 Chronic obstructive pulmonary disease, unspecified: Secondary | ICD-10-CM | POA: Diagnosis not present

## 2019-02-27 DIAGNOSIS — I5042 Chronic combined systolic (congestive) and diastolic (congestive) heart failure: Secondary | ICD-10-CM | POA: Diagnosis not present

## 2019-02-27 DIAGNOSIS — K219 Gastro-esophageal reflux disease without esophagitis: Secondary | ICD-10-CM | POA: Diagnosis not present

## 2019-02-27 NOTE — Telephone Encounter (Signed)
Received called from Brittany [name] requesting [place] to send orders to her care facility, [facility].

## 2019-02-27 NOTE — Telephone Encounter (Signed)
No changes were made to her previous dressing changes. The assisted living staff should continue doing the same dressing changes as before.

## 2019-02-27 NOTE — Telephone Encounter (Signed)
Called and spoke with Cindy,(Med Tech) at [facility] regarding the orders below.  Jenny Reichmann stated that they will need the orders in writing and signed.  Informed Jenny Reichmann that the provider who saw the patient on yesterday with not be back in the office until next week, but I will ask another provider to sign the orders.  Cindy verbalized understanding and agreed.   Per Mayah,NP-Collagen dressing to left foot wound 2x/week and wrap with ace bandage. Apply vaseline, dry gauze, and ace wrap to left foot wound 5x/week. Home health nurse will perform the collagen dressing changes. Assisted living med techs will perform vaseline dressing changes

## 2019-02-28 ENCOUNTER — Telehealth: Payer: Self-pay | Admitting: Cardiovascular Disease

## 2019-02-28 ENCOUNTER — Ambulatory Visit (INDEPENDENT_AMBULATORY_CARE_PROVIDER_SITE_OTHER): Payer: Medicare Other | Admitting: Cardiology

## 2019-02-28 DIAGNOSIS — Z5181 Encounter for therapeutic drug level monitoring: Secondary | ICD-10-CM

## 2019-02-28 LAB — POCT INR: INR: 3.2 — AB (ref 2.0–3.0)

## 2019-02-28 NOTE — Telephone Encounter (Signed)
See Anti-Coag encounter from 02/28/2019.

## 2019-02-28 NOTE — Telephone Encounter (Signed)
Kathlee Nations from [facility] is calling again to state that the order that is being faxed over to the patients nursing home needs to have the Dr's signature on it.

## 2019-02-28 NOTE — Patient Instructions (Signed)
Description   Spoke with Kathlee Nations [facility]  while with pt, advised to have pt hold today's dose then start on same dosage 1/2 tablet daily except 1 tablet on Mondays and  Fridays. Recheck INR in 9 days. Orders faxed to [facility] at [phone]. Call with any changes [phone].

## 2019-03-04 DIAGNOSIS — Z7901 Long term (current) use of anticoagulants: Secondary | ICD-10-CM | POA: Diagnosis not present

## 2019-03-04 DIAGNOSIS — I25119 Atherosclerotic heart disease of native coronary artery with unspecified angina pectoris: Secondary | ICD-10-CM | POA: Diagnosis not present

## 2019-03-04 DIAGNOSIS — J449 Chronic obstructive pulmonary disease, unspecified: Secondary | ICD-10-CM | POA: Diagnosis not present

## 2019-03-04 DIAGNOSIS — Z5181 Encounter for therapeutic drug level monitoring: Secondary | ICD-10-CM | POA: Diagnosis not present

## 2019-03-04 DIAGNOSIS — I69318 Other symptoms and signs involving cognitive functions following cerebral infarction: Secondary | ICD-10-CM | POA: Diagnosis not present

## 2019-03-04 DIAGNOSIS — N183 Chronic kidney disease, stage 3 unspecified: Secondary | ICD-10-CM | POA: Diagnosis not present

## 2019-03-04 DIAGNOSIS — Z953 Presence of xenogenic heart valve: Secondary | ICD-10-CM | POA: Diagnosis not present

## 2019-03-04 DIAGNOSIS — Z87891 Personal history of nicotine dependence: Secondary | ICD-10-CM | POA: Diagnosis not present

## 2019-03-04 DIAGNOSIS — I272 Pulmonary hypertension, unspecified: Secondary | ICD-10-CM | POA: Diagnosis not present

## 2019-03-04 DIAGNOSIS — Z48817 Encounter for surgical aftercare following surgery on the skin and subcutaneous tissue: Secondary | ICD-10-CM | POA: Diagnosis not present

## 2019-03-04 DIAGNOSIS — J9611 Chronic respiratory failure with hypoxia: Secondary | ICD-10-CM | POA: Diagnosis not present

## 2019-03-04 DIAGNOSIS — I130 Hypertensive heart and chronic kidney disease with heart failure and stage 1 through stage 4 chronic kidney disease, or unspecified chronic kidney disease: Secondary | ICD-10-CM | POA: Diagnosis not present

## 2019-03-04 DIAGNOSIS — I482 Chronic atrial fibrillation, unspecified: Secondary | ICD-10-CM | POA: Diagnosis not present

## 2019-03-04 DIAGNOSIS — M21372 Foot drop, left foot: Secondary | ICD-10-CM | POA: Diagnosis not present

## 2019-03-04 DIAGNOSIS — F0150 Vascular dementia without behavioral disturbance: Secondary | ICD-10-CM | POA: Diagnosis not present

## 2019-03-04 DIAGNOSIS — I5032 Chronic diastolic (congestive) heart failure: Secondary | ICD-10-CM | POA: Diagnosis not present

## 2019-03-05 DIAGNOSIS — Z48817 Encounter for surgical aftercare following surgery on the skin and subcutaneous tissue: Secondary | ICD-10-CM | POA: Diagnosis not present

## 2019-03-05 DIAGNOSIS — I25119 Atherosclerotic heart disease of native coronary artery with unspecified angina pectoris: Secondary | ICD-10-CM | POA: Diagnosis not present

## 2019-03-05 DIAGNOSIS — F0150 Vascular dementia without behavioral disturbance: Secondary | ICD-10-CM | POA: Diagnosis not present

## 2019-03-05 DIAGNOSIS — I482 Chronic atrial fibrillation, unspecified: Secondary | ICD-10-CM | POA: Diagnosis not present

## 2019-03-05 DIAGNOSIS — I130 Hypertensive heart and chronic kidney disease with heart failure and stage 1 through stage 4 chronic kidney disease, or unspecified chronic kidney disease: Secondary | ICD-10-CM | POA: Diagnosis not present

## 2019-03-05 DIAGNOSIS — I69318 Other symptoms and signs involving cognitive functions following cerebral infarction: Secondary | ICD-10-CM | POA: Diagnosis not present

## 2019-03-05 DIAGNOSIS — F411 Generalized anxiety disorder: Secondary | ICD-10-CM | POA: Diagnosis not present

## 2019-03-11 ENCOUNTER — Ambulatory Visit (INDEPENDENT_AMBULATORY_CARE_PROVIDER_SITE_OTHER): Payer: Medicare Other | Admitting: Cardiovascular Disease

## 2019-03-11 DIAGNOSIS — Z48817 Encounter for surgical aftercare following surgery on the skin and subcutaneous tissue: Secondary | ICD-10-CM | POA: Diagnosis not present

## 2019-03-11 DIAGNOSIS — I69318 Other symptoms and signs involving cognitive functions following cerebral infarction: Secondary | ICD-10-CM | POA: Diagnosis not present

## 2019-03-11 DIAGNOSIS — I482 Chronic atrial fibrillation, unspecified: Secondary | ICD-10-CM | POA: Diagnosis not present

## 2019-03-11 DIAGNOSIS — I130 Hypertensive heart and chronic kidney disease with heart failure and stage 1 through stage 4 chronic kidney disease, or unspecified chronic kidney disease: Secondary | ICD-10-CM | POA: Diagnosis not present

## 2019-03-11 DIAGNOSIS — Z5181 Encounter for therapeutic drug level monitoring: Secondary | ICD-10-CM | POA: Diagnosis not present

## 2019-03-11 DIAGNOSIS — F0150 Vascular dementia without behavioral disturbance: Secondary | ICD-10-CM | POA: Diagnosis not present

## 2019-03-11 DIAGNOSIS — I25119 Atherosclerotic heart disease of native coronary artery with unspecified angina pectoris: Secondary | ICD-10-CM | POA: Diagnosis not present

## 2019-03-11 LAB — POCT INR: INR: 2.2 (ref 2.0–3.0)

## 2019-03-11 NOTE — Patient Instructions (Signed)
Description   Spoke with [facility] while with pt, advised to have pt continue taking  1/2 tablet daily except 1 tablet on Mondays and  Fridays. Recheck INR on 12/17, (2.5 weeks) Orders faxed to [facility] at [phone]. Call with any changes [phone].

## 2019-03-12 DIAGNOSIS — F411 Generalized anxiety disorder: Secondary | ICD-10-CM | POA: Diagnosis not present

## 2019-03-13 ENCOUNTER — Other Ambulatory Visit: Payer: Self-pay

## 2019-03-13 ENCOUNTER — Non-Acute Institutional Stay: Payer: Medicare Other | Admitting: Licensed Clinical Social Worker

## 2019-03-13 DIAGNOSIS — Z515 Encounter for palliative care: Secondary | ICD-10-CM

## 2019-03-13 NOTE — Telephone Encounter (Signed)
Faxed wound dressing orders below on (03/01/19) to [facility]-Attn:Cindy,Med Tech.  Confirmation received.//AB/CMA

## 2019-03-14 DIAGNOSIS — Z20828 Contact with and (suspected) exposure to other viral communicable diseases: Secondary | ICD-10-CM | POA: Diagnosis not present

## 2019-03-14 DIAGNOSIS — I69318 Other symptoms and signs involving cognitive functions following cerebral infarction: Secondary | ICD-10-CM | POA: Diagnosis not present

## 2019-03-14 DIAGNOSIS — F0150 Vascular dementia without behavioral disturbance: Secondary | ICD-10-CM | POA: Diagnosis not present

## 2019-03-14 DIAGNOSIS — Z48817 Encounter for surgical aftercare following surgery on the skin and subcutaneous tissue: Secondary | ICD-10-CM | POA: Diagnosis not present

## 2019-03-14 DIAGNOSIS — I482 Chronic atrial fibrillation, unspecified: Secondary | ICD-10-CM | POA: Diagnosis not present

## 2019-03-14 DIAGNOSIS — I25119 Atherosclerotic heart disease of native coronary artery with unspecified angina pectoris: Secondary | ICD-10-CM | POA: Diagnosis not present

## 2019-03-14 DIAGNOSIS — I130 Hypertensive heart and chronic kidney disease with heart failure and stage 1 through stage 4 chronic kidney disease, or unspecified chronic kidney disease: Secondary | ICD-10-CM | POA: Diagnosis not present

## 2019-03-14 NOTE — Progress Notes (Signed)
COMMUNITY PALLIATIVE CARE SW NOTE  PATIENT NAME: Brittany [name] DOB: 01/27/1928 MRN: [id number]  PRIMARY CARE PROVIDER: Shaw, Kimberlee, MD  RESPONSIBLE PARTY:  Acct ID - Guarantor Home Phone Work Phone Relationship Acct Type  [id number] - Follansbee,ESTEL* [phone]  Self P/F     [facility], [address], [address], [place],*     PLAN OF CARE and INTERVENTIONS:            GOALS OF CARE/ ADVANCE CARE PLANNING:  Patient's goal is to walk comfortably on her left foot again.  She has a DNR. 1. SOCIAL/EMOTIONAL/SPIRITUAL ASSESSMENT/ INTERVENTIONS:  SW met with patient in her apartment at [facility].  She had her left foot elevated while sitting in her chair.  Her son had open heart surgery today and was in recovery.  She was waiting for her daughter to call her with an update on his condition.  She expressed feeling anxious.  She wanted to go to her work-out class, but didn't want to miss the call.  She ended the visit early.  She denied pain and was pleased with the healing of her foot.  Consulted the facility nurse, Dottie, who reported no new issues. 2. PATIENT/CAREGIVER EDUCATION/ COPING:  Patient copes by having a positive attitude. 3. PERSONAL EMERGENCY PLAN:  Per facility protocol. 4. COMMUNITY RESOURCES COORDINATION/ HEALTH CARE NAVIGATION:  An [facility] nurse changes the dressing on patient's left foot. 5. FINANCIAL/LEGAL CONCERNS/INTERVENTIONS:  None.     SOCIAL HX:  Social History   Tobacco Use  . Smoking status: Former Smoker    Packs/day: 0.50    Years: 40.00    Pack years: 20.00    Types: Cigarettes    Start date: 05/06/1949    Quit date: 03/06/1990    Years since quitting: 29.0  . Smokeless tobacco: Never Used  Substance Use Topics  . Alcohol use: No    Alcohol/week: 0.0 standard drinks    Comment: Remote social EtOH    CODE STATUS: DNR  ADVANCED DIRECTIVES: N MOST FORM COMPLETE:  N HOSPICE EDUCATION PROVIDED: Patient was previously under  Hospice care. PPS:  Appetite is normal.  She uses a walker to ambulate. Duration of visit and documentation:  30 minutes.       Z , LCSW 

## 2019-03-18 DIAGNOSIS — Z48817 Encounter for surgical aftercare following surgery on the skin and subcutaneous tissue: Secondary | ICD-10-CM | POA: Diagnosis not present

## 2019-03-18 DIAGNOSIS — I130 Hypertensive heart and chronic kidney disease with heart failure and stage 1 through stage 4 chronic kidney disease, or unspecified chronic kidney disease: Secondary | ICD-10-CM | POA: Diagnosis not present

## 2019-03-18 DIAGNOSIS — I69318 Other symptoms and signs involving cognitive functions following cerebral infarction: Secondary | ICD-10-CM | POA: Diagnosis not present

## 2019-03-18 DIAGNOSIS — I482 Chronic atrial fibrillation, unspecified: Secondary | ICD-10-CM | POA: Diagnosis not present

## 2019-03-18 DIAGNOSIS — F0150 Vascular dementia without behavioral disturbance: Secondary | ICD-10-CM | POA: Diagnosis not present

## 2019-03-18 DIAGNOSIS — I25119 Atherosclerotic heart disease of native coronary artery with unspecified angina pectoris: Secondary | ICD-10-CM | POA: Diagnosis not present

## 2019-03-19 DIAGNOSIS — Z20828 Contact with and (suspected) exposure to other viral communicable diseases: Secondary | ICD-10-CM | POA: Diagnosis not present

## 2019-03-19 DIAGNOSIS — F411 Generalized anxiety disorder: Secondary | ICD-10-CM | POA: Diagnosis not present

## 2019-03-20 DIAGNOSIS — I69318 Other symptoms and signs involving cognitive functions following cerebral infarction: Secondary | ICD-10-CM | POA: Diagnosis not present

## 2019-03-20 DIAGNOSIS — I130 Hypertensive heart and chronic kidney disease with heart failure and stage 1 through stage 4 chronic kidney disease, or unspecified chronic kidney disease: Secondary | ICD-10-CM | POA: Diagnosis not present

## 2019-03-20 DIAGNOSIS — Z48817 Encounter for surgical aftercare following surgery on the skin and subcutaneous tissue: Secondary | ICD-10-CM | POA: Diagnosis not present

## 2019-03-20 DIAGNOSIS — F0150 Vascular dementia without behavioral disturbance: Secondary | ICD-10-CM | POA: Diagnosis not present

## 2019-03-20 DIAGNOSIS — I25119 Atherosclerotic heart disease of native coronary artery with unspecified angina pectoris: Secondary | ICD-10-CM | POA: Diagnosis not present

## 2019-03-20 DIAGNOSIS — I482 Chronic atrial fibrillation, unspecified: Secondary | ICD-10-CM | POA: Diagnosis not present

## 2019-03-21 ENCOUNTER — Telehealth: Payer: Self-pay | Admitting: *Deleted

## 2019-03-21 IMAGING — DX PORTABLE CHEST - 1 VIEW
1 series · 1 of 1 positions shown · non-contrast
Comparison: Prior radiograph from 05/18/2018.

CLINICAL DATA: Initial evaluation for acute shortness of breath.

EXAM:
PORTABLE CHEST 1 VIEW

[chest ap]
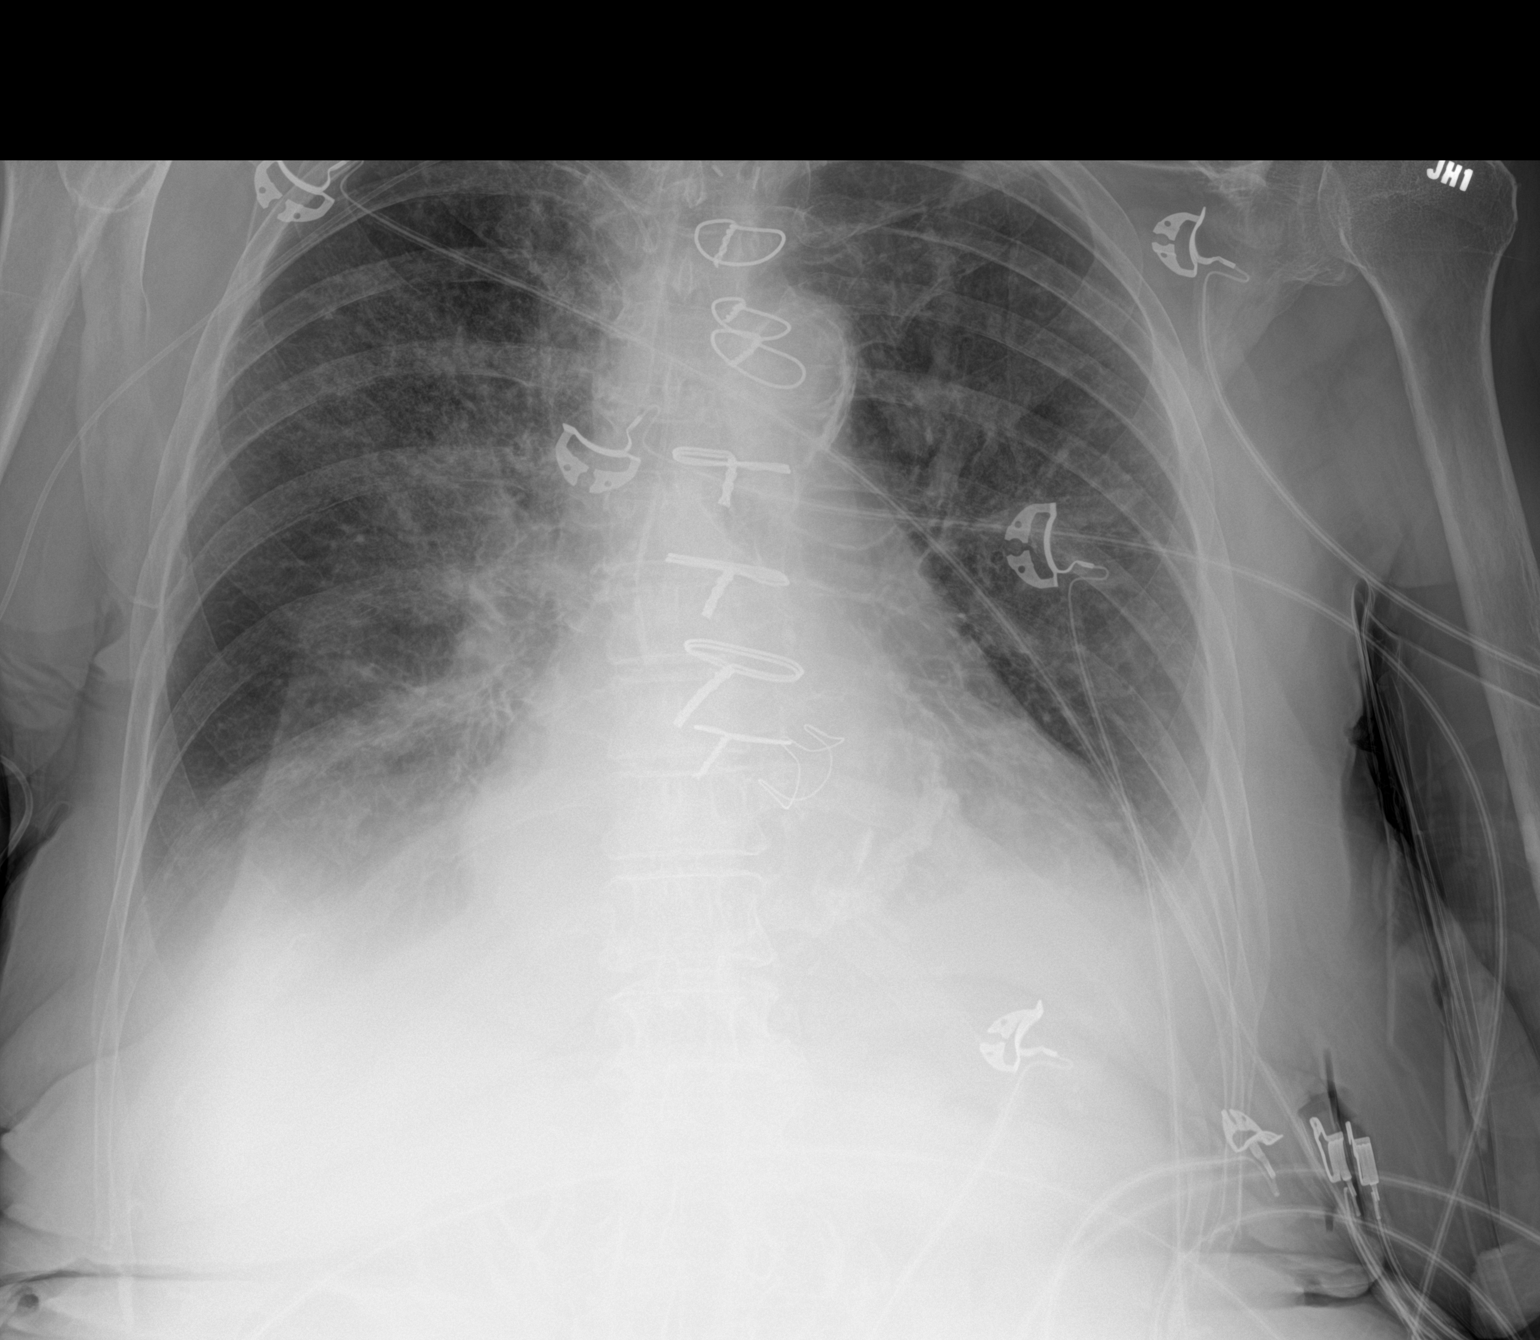

[1 of 1 positions shown; findings below may reference images not displayed]

FINDINGS: Median sternotomy wires with underlying valvular prosthesis noted,
stable. Cardiomegaly unchanged. Mediastinal silhouette normal.
Aortic atherosclerosis. Right-sided PICC catheter remains in place
with tip overlying the cavoatrial junction, stable.

Lungs normally inflated. Moderate layering bilateral pleural
effusions. Underlying moderate diffuse pulmonary interstitial edema.
Associated bibasilar confluent opacities favored to reflect
atelectasis and/or edema. No other consolidative opacity. No
pneumothorax.

No acute osseous finding.  Osteopenia noted.
IMPRESSION: 1. Cardiomegaly with small layering bilateral pleural effusions and
moderate diffuse pulmonary edema, suggesting CHF.
2. Superimposed confluent bibasilar opacities, favored to reflect
atelectasis and/or effusion. Superimposed infiltrates would be
difficult to exclude, and could be considered in the correct
clinical setting.

## 2019-03-21 NOTE — Telephone Encounter (Signed)
Colletta Maryland from [facility] called and stated that the Med techs did not order the pt's Warfarin so the pt has not taken any Warfarin for the last 2 days and she needed instructions on how to dose the pt tonight once med is received. Advised to have the pt to take an extra 1/2 tablet for the next 2 days which is 5mg  today and 7.5mg  tomorrow and then resume normal dose as pt will have her INR checked next week by [facility] and called to [place]. She stated she will take the verbal order at this time and if she needs a hard copy she would call me back and let me know.

## 2019-03-25 ENCOUNTER — Telehealth: Payer: Self-pay | Admitting: *Deleted

## 2019-03-25 ENCOUNTER — Telehealth: Payer: Self-pay

## 2019-03-25 NOTE — Telephone Encounter (Signed)
Received Physician Orders via from [facility] on (03/22/19) to be signed,dated, and return.  Given to physician to sign.//AB/CMA

## 2019-03-25 NOTE — Telephone Encounter (Signed)
Received voicemail from patient's daughter [name] requesting call back as she has questions regarding wound care.

## 2019-03-25 NOTE — Telephone Encounter (Signed)
Called and spoke with the patient's daughter Brittany [name] and she stated that she would like to see if the wound orders could be changed.  The orders now are to change the dressing daily.  She stated that the patient is a resident of [facility], and they have had a outbreak of COVID, and she would like to lessen the number of people going in and out of the patient's room.  She said the wound is looking a lot better and she don't feel it needs to be changed daily.  Informed Brittany [name] that I will speak with Mayah,NP on tomorrow and give her a call back.  She verbalized understanding and agreed.//AB/CMA

## 2019-03-26 DIAGNOSIS — I130 Hypertensive heart and chronic kidney disease with heart failure and stage 1 through stage 4 chronic kidney disease, or unspecified chronic kidney disease: Secondary | ICD-10-CM | POA: Diagnosis not present

## 2019-03-26 DIAGNOSIS — F0150 Vascular dementia without behavioral disturbance: Secondary | ICD-10-CM | POA: Diagnosis not present

## 2019-03-26 DIAGNOSIS — Z48817 Encounter for surgical aftercare following surgery on the skin and subcutaneous tissue: Secondary | ICD-10-CM | POA: Diagnosis not present

## 2019-03-26 DIAGNOSIS — I482 Chronic atrial fibrillation, unspecified: Secondary | ICD-10-CM | POA: Diagnosis not present

## 2019-03-26 DIAGNOSIS — I69318 Other symptoms and signs involving cognitive functions following cerebral infarction: Secondary | ICD-10-CM | POA: Diagnosis not present

## 2019-03-26 DIAGNOSIS — I25119 Atherosclerotic heart disease of native coronary artery with unspecified angina pectoris: Secondary | ICD-10-CM | POA: Diagnosis not present

## 2019-03-26 DIAGNOSIS — F411 Generalized anxiety disorder: Secondary | ICD-10-CM | POA: Diagnosis not present

## 2019-03-26 NOTE — Telephone Encounter (Signed)
Physician orders signed and faxed.  Confirmation received.//AB/CMA

## 2019-03-27 NOTE — Telephone Encounter (Addendum)
Spoke back with the patient's daughter [name] and informed her that I spoke with Mayah,NP and she stated that the patient has been doing good with the wound care.  She said they need to keep doing the collegan dressing, but maybe cut back on the visits that the [facility] Med Tech was doing.  [name] said she can put the collegan on herself.  She stated that she's waiting so see if the patient passes her COVID test.  If she does pass the test she will be taking her home with her.  She stated lets not change anything yet, and she will get back with me.//AB/CMA

## 2019-03-28 ENCOUNTER — Ambulatory Visit (INDEPENDENT_AMBULATORY_CARE_PROVIDER_SITE_OTHER): Payer: Medicare Other | Admitting: *Deleted

## 2019-03-28 ENCOUNTER — Telehealth: Payer: Self-pay | Admitting: Plastic Surgery

## 2019-03-28 DIAGNOSIS — F0150 Vascular dementia without behavioral disturbance: Secondary | ICD-10-CM | POA: Diagnosis not present

## 2019-03-28 DIAGNOSIS — Z5181 Encounter for therapeutic drug level monitoring: Secondary | ICD-10-CM

## 2019-03-28 DIAGNOSIS — I130 Hypertensive heart and chronic kidney disease with heart failure and stage 1 through stage 4 chronic kidney disease, or unspecified chronic kidney disease: Secondary | ICD-10-CM | POA: Diagnosis not present

## 2019-03-28 DIAGNOSIS — I482 Chronic atrial fibrillation, unspecified: Secondary | ICD-10-CM

## 2019-03-28 DIAGNOSIS — I69318 Other symptoms and signs involving cognitive functions following cerebral infarction: Secondary | ICD-10-CM | POA: Diagnosis not present

## 2019-03-28 DIAGNOSIS — Z48817 Encounter for surgical aftercare following surgery on the skin and subcutaneous tissue: Secondary | ICD-10-CM | POA: Diagnosis not present

## 2019-03-28 DIAGNOSIS — I25119 Atherosclerotic heart disease of native coronary artery with unspecified angina pectoris: Secondary | ICD-10-CM | POA: Diagnosis not present

## 2019-03-28 LAB — POCT INR: INR: 2.2 (ref 2.0–3.0)

## 2019-03-28 NOTE — Telephone Encounter (Signed)
Pt called to say that [facility] advised her she could shower without bandage or protection and she wanted to check with [place] to see if this is okay or not. Please call her back and confirm.

## 2019-03-29 NOTE — Telephone Encounter (Signed)
Called and spoke with the patient and informed her that per Dr. Marla Roe yes she can shower without bandage or protection.  Patient verbalized understanding and agreed.//AB/CMA

## 2019-04-01 ENCOUNTER — Other Ambulatory Visit: Payer: Self-pay

## 2019-04-01 ENCOUNTER — Ambulatory Visit (INDEPENDENT_AMBULATORY_CARE_PROVIDER_SITE_OTHER): Payer: Medicare Other | Admitting: Plastic Surgery

## 2019-04-01 ENCOUNTER — Telehealth: Payer: Self-pay | Admitting: *Deleted

## 2019-04-01 DIAGNOSIS — S91302A Unspecified open wound, left foot, initial encounter: Secondary | ICD-10-CM

## 2019-04-01 DIAGNOSIS — I214 Non-ST elevation (NSTEMI) myocardial infarction: Secondary | ICD-10-CM

## 2019-04-01 NOTE — Telephone Encounter (Signed)
Per Dr. Merilyn Baba cancel home health  [facility].    Called and spoke with Santiago Glad w/ [facility] and informed her of the above message from Dr. Marla Roe.  She asked for the reason for the discharge for the patient.  Informed her that Dr. Marla Roe had a televisit today with the patient and per Dr. Nena Alexander wound is doing extremely well.  It appears to have epithelialized completely.  They have been using the collagen and then switch to just the Vaseline.  At this point I do not think she needs home health anymore.  Santiago Glad verbalized understanding and agreed and stated she will let the nurse know.//AB/CMA

## 2019-04-01 NOTE — Progress Notes (Signed)
The patient is a very sweet 85 year old lady joining me by phone.  She is with her daughter at the moment.  There was a massive outbreak of Covid at the nursing facility so her daughter brought her to her house.  Her wound is doing extremely well.  It appears to have epithelialized completely.  They have been using the collagen and then switch to just the Vaseline.  At this point I do not think she needs home health anymore.  I would like to talk with her prior to going back to the nursing facility to see if we need to make any changes.  The patient and daughter are in agreement.  And we will talk to them after the holidays.    The patient gave consent to have this visit done by telemedicine / virtual visit.  This is also consent for access the chart and treat the patient via this visit. The patient is located at her daughter's house.  I, the provider, am at the office.  We spent 5 minutes together for the visit.  Joined by her daughter.

## 2019-04-09 ENCOUNTER — Telehealth: Payer: Self-pay | Admitting: Plastic Surgery

## 2019-04-09 NOTE — Telephone Encounter (Signed)
FYI-Pt called to advise that she would be returning to [facility] and was told to let the NP know this. If there are any other questions, please feel free to give her a call back. Her daughter mentioned Mayah in the background so I'm not sure if Dr. Marla Roe or [place] requested the [facility].

## 2019-04-09 NOTE — Telephone Encounter (Signed)
Returned daughter's call. She advised wound is completley healed. When she pulls the bandage off, it is completely clear and clean. I will talk to Dr. Marla Roe and ask if she would like for patient to discontinue the Vaseline and call daughter back with instructions.

## 2019-04-10 ENCOUNTER — Telehealth: Payer: Self-pay | Admitting: Pharmacist

## 2019-04-10 NOTE — Telephone Encounter (Signed)
[facility] called stating patient left for holiday and when she came back her card of warfarin was empty. They do not know if she took too much. Would like to have her INR checked. Faxed over orders to have INR check tomorrow AM.

## 2019-04-11 ENCOUNTER — Emergency Department (HOSPITAL_COMMUNITY)
Admission: EM | Admit: 2019-04-11 | Discharge: 2019-04-11 | Disposition: A | Discharge status: Home / Self Care | Payer: Medicare Other | Attending: Emergency Medicine | Admitting: Emergency Medicine

## 2019-04-11 ENCOUNTER — Emergency Department (HOSPITAL_COMMUNITY): Payer: Medicare Other

## 2019-04-11 ENCOUNTER — Other Ambulatory Visit: Payer: Self-pay

## 2019-04-11 ENCOUNTER — Encounter (HOSPITAL_COMMUNITY): Payer: Self-pay | Admitting: Emergency Medicine

## 2019-04-11 DIAGNOSIS — F0390 Unspecified dementia without behavioral disturbance: Secondary | ICD-10-CM | POA: Diagnosis not present

## 2019-04-11 DIAGNOSIS — M899 Disorder of bone, unspecified: Secondary | ICD-10-CM | POA: Insufficient documentation

## 2019-04-11 DIAGNOSIS — N183 Chronic kidney disease, stage 3 unspecified: Secondary | ICD-10-CM | POA: Diagnosis not present

## 2019-04-11 DIAGNOSIS — Y939 Activity, unspecified: Secondary | ICD-10-CM | POA: Diagnosis not present

## 2019-04-11 DIAGNOSIS — Z8673 Personal history of transient ischemic attack (TIA), and cerebral infarction without residual deficits: Secondary | ICD-10-CM | POA: Insufficient documentation

## 2019-04-11 DIAGNOSIS — I2510 Atherosclerotic heart disease of native coronary artery without angina pectoris: Secondary | ICD-10-CM | POA: Diagnosis not present

## 2019-04-11 DIAGNOSIS — I252 Old myocardial infarction: Secondary | ICD-10-CM | POA: Diagnosis not present

## 2019-04-11 DIAGNOSIS — R52 Pain, unspecified: Secondary | ICD-10-CM | POA: Diagnosis not present

## 2019-04-11 DIAGNOSIS — Z87891 Personal history of nicotine dependence: Secondary | ICD-10-CM | POA: Diagnosis not present

## 2019-04-11 DIAGNOSIS — J449 Chronic obstructive pulmonary disease, unspecified: Secondary | ICD-10-CM | POA: Insufficient documentation

## 2019-04-11 DIAGNOSIS — R531 Weakness: Secondary | ICD-10-CM | POA: Diagnosis not present

## 2019-04-11 DIAGNOSIS — Z743 Need for continuous supervision: Secondary | ICD-10-CM | POA: Diagnosis not present

## 2019-04-11 DIAGNOSIS — I130 Hypertensive heart and chronic kidney disease with heart failure and stage 1 through stage 4 chronic kidney disease, or unspecified chronic kidney disease: Secondary | ICD-10-CM | POA: Insufficient documentation

## 2019-04-11 DIAGNOSIS — Y92129 Unspecified place in nursing home as the place of occurrence of the external cause: Secondary | ICD-10-CM | POA: Insufficient documentation

## 2019-04-11 DIAGNOSIS — I509 Heart failure, unspecified: Secondary | ICD-10-CM | POA: Diagnosis not present

## 2019-04-11 DIAGNOSIS — R279 Unspecified lack of coordination: Secondary | ICD-10-CM | POA: Diagnosis not present

## 2019-04-11 DIAGNOSIS — W010XXA Fall on same level from slipping, tripping and stumbling without subsequent striking against object, initial encounter: Secondary | ICD-10-CM | POA: Insufficient documentation

## 2019-04-11 DIAGNOSIS — S59901A Unspecified injury of right elbow, initial encounter: Secondary | ICD-10-CM | POA: Diagnosis present

## 2019-04-11 DIAGNOSIS — S51011A Laceration without foreign body of right elbow, initial encounter: Secondary | ICD-10-CM | POA: Diagnosis not present

## 2019-04-11 DIAGNOSIS — Z79899 Other long term (current) drug therapy: Secondary | ICD-10-CM | POA: Diagnosis not present

## 2019-04-11 DIAGNOSIS — I4891 Unspecified atrial fibrillation: Secondary | ICD-10-CM

## 2019-04-11 DIAGNOSIS — Z7901 Long term (current) use of anticoagulants: Secondary | ICD-10-CM | POA: Insufficient documentation

## 2019-04-11 DIAGNOSIS — W19XXXA Unspecified fall, initial encounter: Secondary | ICD-10-CM

## 2019-04-11 DIAGNOSIS — Y999 Unspecified external cause status: Secondary | ICD-10-CM | POA: Diagnosis not present

## 2019-04-11 LAB — CBC WITH DIFFERENTIAL/PLATELET
Abs Immature Granulocytes: 0.06 10*3/uL (ref 0.00–0.07)
Basophils Absolute: 0.1 10*3/uL (ref 0.0–0.1)
Basophils Relative: 1 %
Eosinophils Absolute: 0.2 10*3/uL (ref 0.0–0.5)
Eosinophils Relative: 1 %
HCT: 41.8 % (ref 36.0–46.0)
Hemoglobin: 13.4 g/dL (ref 12.0–15.0)
Immature Granulocytes: 1 %
Lymphocytes Relative: 13 %
Lymphs Abs: 1.6 10*3/uL (ref 0.7–4.0)
MCH: 28.3 pg (ref 26.0–34.0)
MCHC: 32.1 g/dL (ref 30.0–36.0)
MCV: 88.2 fL (ref 80.0–100.0)
Monocytes Absolute: 1.3 10*3/uL — ABNORMAL HIGH (ref 0.1–1.0)
Monocytes Relative: 11 %
Neutro Abs: 9.5 10*3/uL — ABNORMAL HIGH (ref 1.7–7.7)
Neutrophils Relative %: 73 %
Platelets: 244 10*3/uL (ref 150–400)
RBC: 4.74 MIL/uL (ref 3.87–5.11)
RDW: 13.8 % (ref 11.5–15.5)
WBC: 12.7 10*3/uL — ABNORMAL HIGH (ref 4.0–10.5)
nRBC: 0 % (ref 0.0–0.2)

## 2019-04-11 LAB — BASIC METABOLIC PANEL
Anion gap: 10 (ref 5–15)
BUN: 17 mg/dL (ref 8–23)
CO2: 29 mmol/L (ref 22–32)
Calcium: 12 mg/dL — ABNORMAL HIGH (ref 8.9–10.3)
Chloride: 96 mmol/L — ABNORMAL LOW (ref 98–111)
Creatinine, Ser: 1.09 mg/dL — ABNORMAL HIGH (ref 0.44–1.00)
GFR calc Af Amer: 52 mL/min — ABNORMAL LOW (ref >60)
GFR calc non Af Amer: 45 mL/min — ABNORMAL LOW (ref >60)
Glucose, Bld: 112 mg/dL — ABNORMAL HIGH (ref 70–99)
Potassium: 3.7 mmol/L (ref 3.5–5.1)
Sodium: 135 mmol/L (ref 135–145)

## 2019-04-11 LAB — PROTIME-INR
INR: 1.6 — ABNORMAL HIGH (ref 0.8–1.2)
Prothrombin Time: 19.2 seconds — ABNORMAL HIGH (ref 11.4–15.2)

## 2019-04-11 MED ORDER — WARFARIN SODIUM 5 MG PO TABS: ORAL_TABLET | ORAL | 1 refills | Status: AC

## 2019-04-11 NOTE — ED Notes (Signed)
Patient verbalizes understanding of discharge instructions. Opportunity for questioning and answers were provided. Armband removed by staff, pt discharged from ED via [place].

## 2019-04-11 NOTE — ED Provider Notes (Signed)
[facility] EMERGENCY DEPARTMENT Provider Note   CSN: [id number] Arrival date & time: 04/11/19  1041     History Chief Complaint  Patient presents with   Brittany [name] is a 85 y.o. female.  85 year old female brought in by EMS from nursing facility for a fall.  Patient states that she lost her balance and fell backwards and hit her head.  Patient denies loss of consciousness.  Patient reports pain in her left arm and left hip and back.  Patient is on Coumadin.  Also has a skin tear to her right elbow and left wrist.  No other complaints or concerns.        Past Medical History:  Diagnosis Date   Abnormal liver diagnostic imaging    suspected cirrhosis   Arthritis    Atrial fibrillation ([place])    Blind    legally   Chronic back pain    COPD (chronic obstructive pulmonary disease) ([place])    Diverticulosis    Dyslipidemia    takes Lipitor daily   Dysrhythmia    Atrial Fibrillation   Foot drop    SINCE 1987   GERD (gastroesophageal reflux disease)    H/O hiatal hernia    Hearing impaired    Bilateral hearing aids   History of bacterial endocarditis    History of gastritis    History of stomach ulcers    many yrs ago   History of TIA (transient ischemic attack)    2013-- RESIDUAL PERIPHERAL VISION RIGHT EYE--  RESOLVED   Hypertension    IC (interstitial cystitis)    Macular degeneration    Myocardial infarction [facility])    Pulmonary hypertension ([place]) 10/2013   Based on TTE   S/P aortic valve replacement    2005   Stroke [facility])    TIA, no preherial vison    Urine incontinence    Wears dentures    Wears glasses     Patient Active Problem List   Diagnosis Date Noted   CAD (coronary artery disease) 07/13/2018   Hyperlipidemia 07/13/2018   Open wound of foot 07/05/2018   Acute on chronic congestive heart failure ([place])    Weakness generalized    Palliative care by specialist    DNR (do not  resuscitate)    Dyspnea 06/23/2018   Other cirrhosis of liver ([place]) 06/23/2018   Left foot drop 06/23/2018   Acute on chronic respiratory failure with hypoxia ([place]) 06/23/2018   COPD with chronic bronchitis ([place]) 06/23/2018   Acute hypoxemic respiratory failure ([place]) 06/23/2018   Heart failure ([place]) 06/23/2018   Streptococcal bacteremia    Sepsis secondary to UTI ([place]) [id number]   Acute metabolic encephalopathy    Acute lower UTI    Confusion 05/04/2018   Dementia ([place]) 05/04/2018   Chronic ulcer of left foot ([place]) 05/04/2018   CKD (chronic kidney disease), stage III 01/30/2018   H/O hiatal hernia    Allergic rhinitis 06/06/2017   Mediastinal lymphadenopathy 06/06/2017   Encounter for therapeutic drug monitoring 10/04/2016   Colitis 09/29/2016   Abdominal pain 09/29/2016   Diarrhea 09/29/2016   Chronic back pain 09/29/2016   Dyspepsia 04/14/2016   Cough 12/02/2014   Emphysema lung ([place]) 12/02/2014   Oral thrush 12/02/2014   Multiple lung nodules on CT 10/07/2014   Nausea without vomiting 09/30/2014   Hyponatremia 08/27/2014   Nausea & vomiting 08/27/2014   Anemia 02/06/2014   Perioral numbness 10/19/2013   Headache(784.0) 10/12/2013  Chronic diastolic heart failure (HCC) 10/12/2013   Atelectasis 10/12/2013   Pulmonary hypertension ([place]) 10/12/2013   Cephalalgia    Dizziness 10/11/2013   Headache 10/11/2013   S/P AVR (aortic valve replacement) 11/19/2012   Abdominal pain, periumbilic [id number]   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   Calculus of bile duct without mention of cholecystitis or obstruction 08/10/2012   Chronic gastritis 07/16/2012   Nausea alone 05/21/2012   Abdominal pain, epigastric 05/21/2012   Cellulitis of foot 01/01/2012   Dysphagia, unspecified(787.20) 12/26/2011   Esophageal reflux 12/26/2011   Dyspnea on exertion 11/18/2011   Chronic atrial fibrillation ([place]) 11/07/2011   Chest pain 06/06/2011     Aortic stenosis 03/24/2011   Hypertension 03/24/2011   H/O aortic valve replacement 10/02/2003    Past Surgical History:  Procedure Laterality Date   ABDOMINAL HYSTERECTOMY  1967   AORTIC VALVE REPLACEMENT  09-29-2003    Wilson pericardial tissue valve   APPENDECTOMY  123456   APPLICATION OF A-CELL OF EXTREMITY Left 09/06/2018   Procedure: DEBRIDMENT LEFT FOOT WITH ACELL PLACEMENT;  Surgeon: Wallace Going, DO;  Location: [place];  Service: Plastics;  Laterality: Left;   APPLICATION OF A-CELL OF EXTREMITY Left 10/31/2018   Procedure: APPLICATION OF A-CELL OF EXTREMITY;  Surgeon: Wallace Going, DO;  Location: [place];  Service: Plastics;  Laterality: Left;   BIOPSY  02/01/2018   Procedure: BIOPSY;  Surgeon: Rush Landmark Telford Nab., MD;  Location: [place];  Service: Gastroenterology;;   CARDIAC CATHETERIZATION  07-15-2003 DR HELEN PRESTON   MODERATE TO MODERATELY SEVERE CALCIFIC AORTIC STENOSIS/  NORMAL CORONARY ARTERIES   CARDIOVASCULAR STRESS TEST  01-30-2012  DR NASHER   NORMAL NUCLEAR STUDY/  EF 73%/  NORMAL LVF   CARPAL TUNNEL RELEASE Bilateral    CATARACT EXTRACTION W/ INTRAOCULAR LENS  IMPLANT, BILATERAL     CHOLECYSTECTOMY  1993   CYSTO WITH HYDRODISTENSION N/A 02/05/2013   Procedure: CYSTOSCOPY/HYDRODISTENSION;  Surgeon: Irine Seal, MD;  Location: [facility];  Service: Urology;  Laterality: N/A;   CYSTO/ HYDRODISTENTION/ INSTILLATION CLORPACTIN  MULTIPLE  last one 2009   DILATION AND CURETTAGE OF UTERUS     ERCP N/A 08/10/2012   Procedure: ENDOSCOPIC RETROGRADE CHOLANGIOPANCREATOGRAPHY (ERCP);  Surgeon: Inda Castle, MD;  Location: [place];  Service: Gastroenterology;  Laterality: N/A;   ESOPHAGOGASTRODUODENOSCOPY (EGD) WITH PROPOFOL N/A 08/18/2014   Procedure: ESOPHAGOGASTRODUODENOSCOPY (EGD) WITH PROPOFOL;  Surgeon: Inda Castle, MD;  Location: [facility];  Service: Endoscopy;  Laterality: N/A;    ESOPHAGOGASTRODUODENOSCOPY (EGD) WITH PROPOFOL N/A 02/01/2018   Procedure: ESOPHAGOGASTRODUODENOSCOPY (EGD) WITH PROPOFOL;  Surgeon: Rush Landmark Telford Nab., MD;  Location: [place];  Service: Gastroenterology;  Laterality: N/A;   FOREIGN BODY REMOVAL  02/01/2018   Procedure: FOREIGN BODY REMOVAL;  Surgeon: Rush Landmark Telford Nab., MD;  Location: [place];  Service: Gastroenterology;;   INCISION AND DRAINAGE OF WOUND Left 10/31/2018   Procedure: DEBRIDEMENT LEFT FOOT WOUND;  Surgeon: Wallace Going, DO;  Location: [place];  Service: Plastics;  Laterality: Left;   LEFT HEART CATHETERIZATION WITH CORONARY ANGIOGRAM N/A 10/18/2013   Procedure: LEFT HEART CATHETERIZATION WITH CORONARY ANGIOGRAM;  Surgeon: Jettie Booze, MD;  Location: [facility] CATH LAB;  Service: Cardiovascular;  Laterality: N/A;   [place]  2007   MACROPLASTIQUE URETHRAL IMPLANTATION  08-27-2009   MINI-OPEN RIGHT ROTATOR CUFF REPAIR  07-12-2001   RIGHT SHOULDER ARTHROSCOPY W/ DEBRIDEMENT ROTATOR CUFF AND LABRAL TEAR/ ACROMINOPLASTY/ DISTAL CLAVICLE EXCISION/ CA LIGAMENT RELEASE  10-08-1999   TEE WITHOUT CARDIOVERSION N/A 06/20/2018   Procedure: TRANSESOPHAGEAL ECHOCARDIOGRAM (TEE);  Surgeon: Jerline Pain, MD;  Location[facility] ENDOSCOPY;  Service: Cardiovascular;  Laterality: N/A;   TONSILLECTOMY AND ADENOIDECTOMY     TRANSTHORACIC ECHOCARDIOGRAM  08-10-2011   MILD LVH/  EF 55-60%/  NORMAL AVR TISSUE/ MILD MR/  MODERATE DILATED LA/  MILD DILATED RA   TRANSVAGINAL TAPE PROCEDURE  07-30-2002   VEIN LIGATION  1969   RIGHT LOWER LEG     OB History   No obstetric history on file.     Family History  Problem Relation Age of Onset   Heart disease Father    Stomach cancer Maternal Grandmother    Esophageal cancer Maternal Grandfather    Bipolar disorder Son        Committed Suicide   Coronary artery disease Brother    Bipolar disorder Brother        commited suiside at 70yo    Alcohol abuse Sister        sister #1   Alcohol abuse Sister        sister #2   Colon cancer Maternal Aunt    Lung disease Neg Hx     Social History   Tobacco Use   Smoking status: Former Smoker    Packs/day: 0.50    Years: 40.00    Pack years: 20.00    Types: Cigarettes    Start date: 05/06/1949    Quit date: 03/06/1990    Years since quitting: 29.1   Smokeless tobacco: Never Used  Substance Use Topics   Alcohol use: No    Alcohol/week: 0.0 standard drinks    Comment: Remote social EtOH   Drug use: No    Home Medications Prior to Admission medications   Medication Sig Start Date End Date Taking? Authorizing Provider  acetaminophen (TYLENOL) 500 MG tablet Take 500 mg by mouth every 6 (six) hours as needed (for pain/fever).    [provider]  albuterol (VENTOLIN HFA) 108 (90 Base) MCG/ACT inhaler Inhale 1 puff into the lungs every 4 (four) hours as needed for wheezing or shortness of breath.    [provider]  atorvastatin (LIPITOR) 20 MG tablet Take 20 mg by mouth daily.    [provider]  busPIRone (BUSPAR) 15 MG tablet Take 1 tablet (15 mg total) by mouth 2 (two) times daily. PLEASE SCHEDULE AN OFFICE VISIT FOR FURTHER REFILLS: [phone] 12/18/18   Yetta Flock, MD  carboxymethylcellulose 1 % ophthalmic solution Place 1 drop into both eyes at bedtime.    [provider]  cycloSPORINE (RESTASIS) 0.05 % ophthalmic emulsion Place 1 drop into both eyes 2 (two) times daily.    [provider]  diazepam (VALIUM) 5 MG tablet Take 5 mg by mouth at bedtime as needed for anxiety.    [provider]  diltiazem (CARDIZEM) 120 MG tablet Take 120 mg by mouth 2 (two) times daily.    [provider]  enoxaparin (LOVENOX) 80 MG/0.8ML injection Inject 80 mg into the skin daily. To hod on the 21 and 22 nd- per Jenny Reichmann at [facility], Historical, MD  Eyelid Cleansers (OCUSOFT LID SCRUB EX) Apply 1  application topically 2 (two) times a day.    [provider]  famotidine (PEPCID) 40 MG tablet Take 40 mg by mouth at bedtime.    [provider]  HYDROcodone-acetaminophen (NORCO/VICODIN) 5-325 MG tablet Take 1 tablet by mouth every 6 (  six) hours as needed for severe pain. 02/02/18   Aline August, MD  Multiple Vitamins-Minerals (PRESERVISION AREDS 2 PO) Take 1 tablet by mouth daily.    [provider]  nitroGLYCERIN (NITROSTAT) 0.4 MG SL tablet Place 1 tablet (0.4 mg total) under the tongue every 5 (five) minutes as needed for chest pain. 02/02/18   Aline August, MD  ondansetron (ZOFRAN) 4 MG tablet Take 4 mg by mouth every 4 (four) hours as needed for nausea or vomiting.    [provider]  oxybutynin (DITROPAN-XL) 5 MG 24 hr tablet Take 5 mg by mouth 2 (two) times a day.    [provider]  pantoprazole (PROTONIX) 40 MG tablet Take 40 mg by mouth 2 (two) times daily. 06/18/18   [provider]  Polyvinyl Alcohol-Povidone (REFRESH OP) Place 1 drop into both eyes 2 (two) times daily.    [provider]  potassium chloride (K-DUR) 10 MEQ tablet Take 10 mEq by mouth daily.     [provider]  torsemide (DEMADEX) 20 MG tablet Take 20 mg by mouth daily.    [provider]  warfarin (COUMADIN) 5 MG tablet Take 1/2- 1 tablet daily 04/11/19   Nahser, Wonda Cheng, MD    Allergies    Amlodipine, Amoxicillin, Carafate [sucralfate], Cephalexin, Ciprofloxacin, Codeine, Irbesartan, Morphine and related, Neurontin [gabapentin], Nitrofurantoin monohyd macro, Penicillins, Prednisone, and Sulfa antibiotics  Review of Systems   Review of Systems  Constitutional: Negative for fever.  Respiratory: Negative for shortness of breath.   Cardiovascular: Negative for chest pain.  Gastrointestinal: Negative for abdominal pain.  Musculoskeletal: Positive for arthralgias, back pain and myalgias.  Skin: Positive for wound.  Neurological:  Negative for dizziness and weakness.  Hematological: Bruises/bleeds easily.  Psychiatric/Behavioral: Negative for confusion.  All other systems reviewed and are negative.   Physical Exam Updated Vital Signs BP (!) 130/54 (BP Location: Right Arm)    Pulse (!) 49    Temp 98.5 F (36.9 C) (Oral)    Resp 12    SpO2 91%   Physical Exam Vitals and nursing note reviewed.  Constitutional:      General: She is not in acute distress.    Appearance: She is well-developed. She is not diaphoretic.  HENT:     Head: Normocephalic and atraumatic.  Eyes:     Extraocular Movements: Extraocular movements intact.     Pupils: Pupils are equal, round, and reactive to light.  Pulmonary:     Effort: Pulmonary effort is normal.  Abdominal:     Palpations: Abdomen is soft.     Tenderness: There is no abdominal tenderness.  Musculoskeletal:        General: Tenderness and signs of injury present. No swelling or deformity.     Right shoulder: Normal.     Left shoulder: Tenderness and bony tenderness present. No crepitus. Normal strength.       Arms:     Cervical back: No tenderness.     Right hip: Normal.     Left hip: Tenderness present.       Legs:  Skin:    General: Skin is warm and dry.     Findings: No erythema or rash.  Neurological:     Mental Status: She is alert and oriented to person, place, and time.  Psychiatric:        Behavior: Behavior normal.     ED Results / Procedures / Treatments   Labs (all labs ordered are listed, but only  abnormal results are displayed) Labs Reviewed  PROTIME-INR - Abnormal; Notable for the following components:      Result Value   Prothrombin Time 19.2 (*)    INR 1.6 (*)    All other components within normal limits  BASIC METABOLIC PANEL - Abnormal; Notable for the following components:   Chloride 96 (*)    Glucose, Bld 112 (*)    Creatinine, Ser 1.09 (*)    Calcium 12.0 (*)    GFR calc non Af Amer 45 (*)    GFR calc Af Amer 52 (*)    All  other components within normal limits  CBC WITH DIFFERENTIAL/PLATELET - Abnormal; Notable for the following components:   WBC 12.7 (*)    Neutro Abs 9.5 (*)    Monocytes Absolute 1.3 (*)    All other components within normal limits    EKG None  Radiology DG Thoracic Spine 2 View  Result Date: 04/11/2019 CLINICAL DATA:  Recent fall with back pain, initial encounter EXAM: THORACIC SPINE 2 VIEWS COMPARISON:  None. FINDINGS: Vertebral body height is well maintained. Mild osteophytic changes are seen. No paraspinal mass lesion is noted. No pedicle abnormality is noted. Aortic calcifications are seen. IMPRESSION: Degenerative change without acute abnormality. Aortic Atherosclerosis (ICD10-I70.0). Electronically Signed   By: Inez Catalina M.D.   On: 04/11/2019 11:55   DG Lumbar Spine Complete  Result Date: 04/11/2019 CLINICAL DATA:  Recent fall with back pain, initial encounter EXAM: LUMBAR SPINE - COMPLETE 4+ VIEW COMPARISON:  06/14/2018 FINDINGS: Five lumbar type vertebral bodies are well visualized. Vertebral body height is well maintained. Disc space narrowing is noted from L1-L4 with associated osteophytic changes. Disc space narrowing is also noted at L5-S1. No pars defects are seen. Aortic calcifications are noted without aneurysmal dilatation. IMPRESSION: Multilevel degenerative change stable from the prior exam. No acute abnormality noted. Electronically Signed   By: Inez Catalina M.D.   On: 04/11/2019 11:52   CT Head Wo Contrast  Result Date: 04/11/2019 CLINICAL DATA:  Falls, on anticoagulation EXAM: CT HEAD WITHOUT CONTRAST TECHNIQUE: Contiguous axial images were obtained from the base of the skull through the vertex without intravenous contrast. COMPARISON:  05/04/2018 FINDINGS: Brain: There is no acute intracranial hemorrhage, mass-effect, or edema. There is no new loss of gray differentiation. 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 Confluent areas of  hypoattenuation in the supratentorial white matter are nonspecific but probably reflect advanced chronic microvascular ischemic changes. There is no extra-axial fluid collection. Prominence of the ventricles and sulci reflects stable parenchymal volume loss. Vascular: There is atherosclerotic calcification at the skull base. Skull: Calvarium is unremarkable. Sinuses/Orbits: No acute finding. Other: None. IMPRESSION: No evidence of acute intracranial injury. Electronically Signed   By: Macy Mis M.D.   On: 04/11/2019 12:15   CT Cervical Spine Wo Contrast  Result Date: 04/11/2019 CLINICAL DATA:  Falls EXAM: CT CERVICAL SPINE WITHOUT CONTRAST TECHNIQUE: Multidetector CT imaging of the cervical spine was performed without intravenous contrast. Multiplanar CT image reconstructions were also generated. COMPARISON:  None. FINDINGS: Alignment: Trace multilevel degenerative listhesis. Skull base and vertebrae: No acute fracture. Vertebral body heights are maintained. There is a 1.1 cm lytic lesion of the left aspect of the C1 anterior arch. Probable slight extraosseous prevertebral soft tissue extension. Soft tissues and spinal canal: No prevertebral fluid or swelling. No visible canal hematoma. Disc levels: Multilevel degenerative changes are present including disc space narrowing, endplate osteophytes, and facet and uncovertebral hypertrophy.  There is no high-grade osseous encroachment on the spinal canal. Multilevel neural foraminal stenosis is present. Upper chest: Emphysematous changes at the lung apices. Other: Calcified plaque at the common carotid bifurcations. IMPRESSION: No acute cervical spine fracture. Small lytic lesion of the left C1 anterior arch, which could reflect metastatic disease or myeloma. Electronically Signed   By: Macy Mis M.D.   On: 04/11/2019 12:24   DG Humerus Left  Result Date: 04/11/2019 CLINICAL DATA:  Fall, left side pain EXAM: LEFT HUMERUS - 2+ VIEW COMPARISON:  None.  FINDINGS: There is no evidence of fracture or other focal bone lesions. Soft tissues are unremarkable. IMPRESSION: Negative. Electronically Signed   By: Rolm Baptise M.D.   On: 04/11/2019 11:51   DG Hip Unilat With Pelvis 2-3 Views Left  Result Date: 04/11/2019 CLINICAL DATA:  Fall.  Left side pain EXAM: DG HIP (WITH OR WITHOUT PELVIS) 2-3V LEFT COMPARISON:  None. FINDINGS: Slight joint space narrowing and spurring in the hip joints bilaterally. SI joints symmetric and unremarkable. No acute bony abnormality. Specifically, no fracture, subluxation, or dislocation. IMPRESSION: No acute bony abnormality. Electronically Signed   By: Rolm Baptise M.D.   On: 04/11/2019 11:50    Procedures Procedures (including critical care time)  Medications Ordered in ED Medications - No data to display  ED Course  I have reviewed the triage vital signs and the nursing notes.  Pertinent labs & imaging results that were available during my care of the patient were reviewed by me and considered in my medical decision making (see chart for details).  Clinical Course as of Apr 10 1533  Thu Apr 10, 6164  4532 85 year old female on Coumadin presents for fall.  Patient states that she lost her balance and fell backwards and hit her head.  Patient complains of pain in her left hip and left arm with pain over the left hip and pelvis and tenderness to the mid humerus on exam.  X-rays left humerus and left hip are without acute injury.  Also reported pain in her back, x-ray T-spine and L-spine without acute bony injury.  CT C-spine with possible lytic lesion, may be followed up outpatient.  CT head without acute injury.  On recheck, patient now reports having pain in her right hip and with movement of the leg, states hurts worse if she bears weight.  Plan is to check x-ray of the right hip prior to discharge.   [LM]  [id number] Care signed out to oncoming PA awaiting x-ray right hip.   [LM]    Clinical Course User Index [LM]  Roque Lias   MDM Rules/Calculators/A&P                      Final Clinical Impression(s) / ED Diagnoses Final diagnoses:  Fall, initial encounter  On Coumadin for atrial fibrillation ([place])  Skin tear of right elbow without complication, initial encounter  Lytic bone lesions on xray    Rx / DC Orders ED Discharge Orders    None       Tacy Learn, PA-C 04/11/19 1534    Dorie Rank, MD 04/12/19 [phone]

## 2019-04-11 NOTE — ED Notes (Signed)
Pt ambulated with no assistance using a walker. This RN as a stand by assistance only. Pt did endorse "feeling off balance" EDPA made aware

## 2019-04-11 NOTE — ED Notes (Signed)
Pt's daughter, [name] has been updated several times, this RN has spoke with her 4 different times, no new updates to give, spoke with her again explained I plan to ambulate pt per EDPA request. Unsure if pt will be admitted or d/c at this time.

## 2019-04-11 NOTE — ED Notes (Signed)
Daughter updated pt will be dc'd back to [facility]

## 2019-04-11 NOTE — Telephone Encounter (Signed)
Pt LM requesting a refill on Warfarin and also would like someone to call her to schedule an appt.

## 2019-04-11 NOTE — ED Notes (Signed)
Patient transported to X-ray 

## 2019-04-11 NOTE — Discharge Instructions (Addendum)
Follow up with your doctor for ct c-spine findings of lytic bone lesion. Recheck with your doctor to reassess your injuries. You may need physical therapy. Return to the ER for new or worsening symptoms.

## 2019-04-11 NOTE — ED Provider Notes (Signed)
I assumed care of patient from previous team, please see their note for full H&P.  Briefly patient is here for evaluation after a fall.  She reportedly lost her balance causing her to fall backwards and strike her head.  Chart review shows that cardiology is already following up on her subtherapeutic INR.   Physical Exam  BP (!) 130/54 (BP Location: Right Arm)   Pulse (!) 49   Temp 98.5 F (36.9 C) (Oral)   Resp 12   SpO2 91%   Physical Exam Vitals and nursing note reviewed.  Constitutional:      General: She is not in acute distress.    Appearance: She is well-developed. She is not diaphoretic.  HENT:     Head: Normocephalic and atraumatic.  Eyes:     General: No scleral icterus.       Right eye: No discharge.        Left eye: No discharge.     Conjunctiva/sclera: Conjunctivae normal.  Cardiovascular:     Rate and Rhythm: Normal rate and regular rhythm.  Pulmonary:     Effort: Pulmonary effort is normal. No respiratory distress.     Breath sounds: No stridor.  Abdominal:     General: There is no distension.  Musculoskeletal:        General: No deformity.     Cervical back: Normal range of motion.  Skin:    General: Skin is warm and dry.  Neurological:     Mental Status: She is alert.     Motor: No abnormal muscle tone.  Psychiatric:        Behavior: Behavior normal.     ED Course/Procedures   Clinical Course as of Apr 12 39  Thu Apr 11, 3671  8925 85 year old female on Coumadin presents for fall.  Patient states that she lost her balance and fell backwards and hit her head.  Patient complains of pain in her left hip and left arm with pain over the left hip and pelvis and tenderness to the mid humerus on exam.  X-rays left humerus and left hip are without acute injury.  Also reported pain in her back, x-ray T-spine and L-spine without acute bony injury.  CT C-spine with possible lytic lesion, may be followed up outpatient.  CT head without acute injury.  On recheck,  patient now reports having pain in her right hip and with movement of the leg, states hurts worse if she bears weight.  Plan is to check x-ray of the right hip prior to discharge.   [LM]  [id number] Care signed out to oncoming PA awaiting x-ray right hip.   [LM]    Clinical Course User Index [LM] Tacy Learn, PA-C    Procedures  DG Thoracic Spine 2 View  Result Date: 04/11/2019 CLINICAL DATA:  Recent fall with back pain, initial encounter EXAM: THORACIC SPINE 2 VIEWS COMPARISON:  None. FINDINGS: Vertebral body height is well maintained. Mild osteophytic changes are seen. No paraspinal mass lesion is noted. No pedicle abnormality is noted. Aortic calcifications are seen. IMPRESSION: Degenerative change without acute abnormality. Aortic Atherosclerosis (ICD10-I70.0). Electronically Signed   By: Inez Catalina M.D.   On: 04/11/2019 11:55   DG Lumbar Spine Complete  Result Date: 04/11/2019 CLINICAL DATA:  Recent fall with back pain, initial encounter EXAM: LUMBAR SPINE - COMPLETE 4+ VIEW COMPARISON:  06/14/2018 FINDINGS: Five lumbar type vertebral bodies are well visualized. Vertebral body height is well maintained. Disc space narrowing is noted from L1-L4  with associated osteophytic changes. Disc space narrowing is also noted at L5-S1. No pars defects are seen. Aortic calcifications are noted without aneurysmal dilatation. IMPRESSION: Multilevel degenerative change stable from the prior exam. No acute abnormality noted. Electronically Signed   By: Inez Catalina M.D.   On: 04/11/2019 11:52   CT Head Wo Contrast  Result Date: 04/11/2019 CLINICAL DATA:  Falls, on anticoagulation EXAM: CT HEAD WITHOUT CONTRAST TECHNIQUE: Contiguous axial images were obtained from the base of the skull through the vertex without intravenous contrast. COMPARISON:  05/04/2018 FINDINGS: Brain: There is no acute intracranial hemorrhage, mass-effect, or edema. There is no new loss of gray differentiation. 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 Confluent areas of hypoattenuation in the supratentorial white matter are nonspecific but probably reflect advanced chronic microvascular ischemic changes. There is no extra-axial fluid collection. Prominence of the ventricles and sulci reflects stable parenchymal volume loss. Vascular: There is atherosclerotic calcification at the skull base. Skull: Calvarium is unremarkable. Sinuses/Orbits: No acute finding. Other: None. IMPRESSION: No evidence of acute intracranial injury. Electronically Signed   By: Macy Mis M.D.   On: 04/11/2019 12:15   CT Cervical Spine Wo Contrast  Result Date: 04/11/2019 CLINICAL DATA:  Falls EXAM: CT CERVICAL SPINE WITHOUT CONTRAST TECHNIQUE: Multidetector CT imaging of the cervical spine was performed without intravenous contrast. Multiplanar CT image reconstructions were also generated. COMPARISON:  None. FINDINGS: Alignment: Trace multilevel degenerative listhesis. Skull base and vertebrae: No acute fracture. Vertebral body heights are maintained. There is a 1.1 cm lytic lesion of the left aspect of the C1 anterior arch. Probable slight extraosseous prevertebral soft tissue extension. Soft tissues and spinal canal: No prevertebral fluid or swelling. No visible canal hematoma. Disc levels: Multilevel degenerative changes are present including disc space narrowing, endplate osteophytes, and facet and uncovertebral hypertrophy. There is no high-grade osseous encroachment on the spinal canal. Multilevel neural foraminal stenosis is present. Upper chest: Emphysematous changes at the lung apices. Other: Calcified plaque at the common carotid bifurcations. IMPRESSION: No acute cervical spine fracture. Small lytic lesion of the left C1 anterior arch, which could reflect metastatic disease or myeloma. Electronically Signed   By: Macy Mis M.D.   On: 04/11/2019 12:24   DG Humerus Left  Result Date: 04/11/2019 CLINICAL DATA:   Fall, left side pain EXAM: LEFT HUMERUS - 2+ VIEW COMPARISON:  None. FINDINGS: There is no evidence of fracture or other focal bone lesions. Soft tissues are unremarkable. IMPRESSION: Negative. Electronically Signed   By: Rolm Baptise M.D.   On: 04/11/2019 11:51   DG Hip Unilat With Pelvis 2-3 Views Left  Result Date: 04/11/2019 CLINICAL DATA:  Fall.  Left side pain EXAM: DG HIP (WITH OR WITHOUT PELVIS) 2-3V LEFT COMPARISON:  None. FINDINGS: Slight joint space narrowing and spurring in the hip joints bilaterally. SI joints symmetric and unremarkable. No acute bony abnormality. Specifically, no fracture, subluxation, or dislocation. IMPRESSION: No acute bony abnormality. Electronically Signed   By: Rolm Baptise M.D.   On: 04/11/2019 11:50   DG Hip Unilat With Pelvis 2-3 Views Right  Result Date: 04/11/2019 CLINICAL DATA:  Right hip pain after fall EXAM: DG HIP (WITH OR WITHOUT PELVIS) 2-3V RIGHT COMPARISON:  06/07/2017 FINDINGS: There is no evidence of hip fracture or dislocation. Osseous structures are demineralized. There is no evidence of arthropathy or other focal bone abnormality. Sacrum partially obscured by overlying bowel gas. IMPRESSION: No acute fracture or dislocation of the right hip. Electronically  Signed   By: Davina Poke D.O.   On: 04/11/2019 16:21     MDM  Plan is to follow up on remaining x-ray, if normal will discharge.   Remaining x-ray was obtained without evidence of fracture or other acute abnormality.  Orting to nursing notes she was able to walk without significant assistance required.  We discussed mechanisms of reducing risk of falls at the nursing facility.  She does have a subtherapeutic INR and chart review shows that cardiology is already aware and following.  She was at 91% on room air, chart review shows that 3 months ago she was at 93% on room air therefore this appears to be her baseline.  Return precautions were discussed with patient who states their  understanding.  At the time of discharge patient denied any unaddressed complaints or concerns.  Patient is agreeable for discharge home.  Note: Portions of this report may have been transcribed using voice recognition software. Every effort was made to ensure accuracy; however, inadvertent computerized transcription errors may be present       Lorin Glass, PA-C 04/12/19 0112    Maudie Flakes, MD 04/12/19 [phone]

## 2019-04-11 NOTE — Telephone Encounter (Signed)
Called [facility] to follow up on INR that was to be drawn today. State patient had 2 falls today and was sent to ER. Upon review of ER notes/labs patient INR was 1.6. As patient is currently in the ER, will follow up on Monday.

## 2019-04-11 NOTE — ED Triage Notes (Addendum)
Pt here from [facility] for 2 mechanical falls that occurred this morning. Pt c/o L leg and shoulder pain, has skin tears to L wrist, L knee, L hip, R toe, and R elbow. Pt takes coumadin, denies LOC/dizziness. PT states she lost her balance. No hx of dementia, hx of COPD, aox4, VSS.

## 2019-04-16 DIAGNOSIS — Z20828 Contact with and (suspected) exposure to other viral communicable diseases: Secondary | ICD-10-CM | POA: Diagnosis not present

## 2019-04-16 DIAGNOSIS — F411 Generalized anxiety disorder: Secondary | ICD-10-CM | POA: Diagnosis not present

## 2019-04-16 DIAGNOSIS — N390 Urinary tract infection, site not specified: Secondary | ICD-10-CM | POA: Diagnosis not present

## 2019-04-16 NOTE — Telephone Encounter (Signed)
Faxed over orders to [facility] for patient to have INR checked tomorrow

## 2019-04-17 ENCOUNTER — Non-Acute Institutional Stay: Payer: Medicare Other | Admitting: *Deleted

## 2019-04-17 ENCOUNTER — Other Ambulatory Visit: Payer: Self-pay

## 2019-04-17 VITALS — BP 129/67 | HR 68 | Temp 97.6°F | Resp 18

## 2019-04-17 DIAGNOSIS — R278 Other lack of coordination: Secondary | ICD-10-CM | POA: Diagnosis not present

## 2019-04-17 DIAGNOSIS — R262 Difficulty in walking, not elsewhere classified: Secondary | ICD-10-CM | POA: Diagnosis not present

## 2019-04-17 DIAGNOSIS — M6281 Muscle weakness (generalized): Secondary | ICD-10-CM | POA: Diagnosis not present

## 2019-04-17 DIAGNOSIS — R531 Weakness: Secondary | ICD-10-CM | POA: Diagnosis not present

## 2019-04-17 DIAGNOSIS — R293 Abnormal posture: Secondary | ICD-10-CM | POA: Diagnosis not present

## 2019-04-17 DIAGNOSIS — Z515 Encounter for palliative care: Secondary | ICD-10-CM

## 2019-04-18 ENCOUNTER — Ambulatory Visit (INDEPENDENT_AMBULATORY_CARE_PROVIDER_SITE_OTHER): Payer: Medicare Other | Admitting: *Deleted

## 2019-04-18 ENCOUNTER — Other Ambulatory Visit: Payer: Self-pay

## 2019-04-18 DIAGNOSIS — Z5181 Encounter for therapeutic drug level monitoring: Secondary | ICD-10-CM | POA: Diagnosis not present

## 2019-04-18 DIAGNOSIS — I482 Chronic atrial fibrillation, unspecified: Secondary | ICD-10-CM

## 2019-04-18 DIAGNOSIS — R293 Abnormal posture: Secondary | ICD-10-CM | POA: Diagnosis not present

## 2019-04-18 DIAGNOSIS — R278 Other lack of coordination: Secondary | ICD-10-CM | POA: Diagnosis not present

## 2019-04-18 DIAGNOSIS — M6281 Muscle weakness (generalized): Secondary | ICD-10-CM | POA: Diagnosis not present

## 2019-04-18 LAB — POCT INR: INR: 3.5 — AB (ref 2.0–3.0)

## 2019-04-18 NOTE — Progress Notes (Signed)
COMMUNITY PALLIATIVE CARE RN NOTE  PATIENT NAME: Brittany [name] DOB: 02/27/1928 MRN: [id number]  PRIMARY CARE PROVIDER: Mayra Neer, MD  RESPONSIBLE PARTY:  Acct ID - Guarantor Home Phone Work Phone Relationship Acct Type  [id number] [name]  Self P/F     [facility], [address], [place], [place]   Covid-19 Pre-screening Negative  PLAN OF CARE and INTERVENTION:  1. ADVANCE CARE PLANNING/GOALS OF CARE: Goal is for patient to remain at current facility.  2. PATIENT/CAREGIVER EDUCATION: Pain Management, Safe Mobility/Transfers 3. DISEASE STATUS: Met with patient in her room at the facility. Upon arrival, CNA is present assisting patient in transferring from the commode to the wheelchair. Patient is becoming progressively weaker overall. She used to be able to stand, transfer and ambulate using her walker independently. However, now she is requiring 1 person assistance with standing and transfers. Yesterday, she required 2 person assistance with transfers. She is currently working with PT. She had a session this am with PT. She was ambulating with her walker when her knees started buckling and they had to place patient in bed. Over the holidays, she spent 10 days at home with her daughter. When she returned back to the facility she had 2 falls and sustained a skin tear to her right elbow and fell backwards hitting her head. She started c/o pain in her hips and back and was sent to the ED. CT scan of her head and cervical spine performed. Negative for acute intracranial injury and fractures. She also had an xray done of her spine, hips, and pelvis, which were also negative for fractures. She continues to c/o lower back pain and right hip pain. Pain mainly occurring with movement, but is ok at rest. She says she is currently taking Hydrocodone twice daily. Aspercreme ordered by facility NP to help with lower back and right hip pain. She has also had a  cognitive decline over the past week which is concerning to staff. She is slow to process and respond to what is being said, while other times she seems clear. There are times when she may not answer questions at all and just stare. She is closing her eyes on and off throughout visit. Staff reports that during breakfast, patient kept falling asleep. Patient does not remember ever receiving breakfast. Patient was much more alert, engaging and oriented during my visit in November 2020. Her intake has decreased. She is going out tomorrow to have her INR checked. Her daughter was initially going to transport her, however patient is too weak to transfer into the car. She is intermittently incontinent of bladder. She had a urinalysis done recently which showed some WBCs and occult blood. Also on 12/31 in the ED, her WBC was 12.7. Staff questions what is possibly causing her cognitive decline.     Spoke with Palliative Care NP, Violeta Gelinas to provide update on patient. She recommended prescribing antibiotics for patient and possibly reducing her Valium over time. Facility nurse to send recommendations to facility NP.      HISTORY OF PRESENT ILLNESS:  This is a 86 yo female who resides at [facility]. Palliative care team continues to follow patient. Will continue to visit patient monthly and PRN.  CODE STATUS: DNR ADVANCED DIRECTIVES: N MOST FORM: no PPS: 30%   PHYSICAL EXAM:   VITALS: Today's Vitals   04/16/19 1137  BP: 129/67  Pulse: 68  Resp: 18  Temp: 97.6 F (36.4 C)  TempSrc: Temporal  SpO2: 95%  PainSc: 4   PainLoc: Back    LUNGS: clear to auscultation  CARDIAC: Cor RRR EXTREMITIES: No edema SKIN: R elbow dressing for skin tear dry and intact, L foot wound is completely healed  NEURO: Alert and oriented to person/place, intermittent confusion, progressive generalized weakness, requires assistance with standing/transfers   (Duration of visit and documentation 75  minutes)   Daryl Eastern, RN BSN

## 2019-04-18 NOTE — Patient Instructions (Addendum)
Description   Hold today then continue taking 1/2 tablet daily except 1 tablet on Mondays and Fridays. Recheck INR in 2 weeks. Orders faxed to [facility] at [phone]. Call with any changes [phone].

## 2019-04-22 ENCOUNTER — Non-Acute Institutional Stay: Payer: Medicare Other | Admitting: *Deleted

## 2019-04-22 ENCOUNTER — Other Ambulatory Visit: Payer: Self-pay

## 2019-04-22 DIAGNOSIS — R262 Difficulty in walking, not elsewhere classified: Secondary | ICD-10-CM | POA: Diagnosis not present

## 2019-04-22 DIAGNOSIS — R531 Weakness: Secondary | ICD-10-CM | POA: Diagnosis not present

## 2019-04-22 DIAGNOSIS — Z515 Encounter for palliative care: Secondary | ICD-10-CM

## 2019-04-22 DIAGNOSIS — R278 Other lack of coordination: Secondary | ICD-10-CM | POA: Diagnosis not present

## 2019-04-23 DIAGNOSIS — R278 Other lack of coordination: Secondary | ICD-10-CM | POA: Diagnosis not present

## 2019-04-23 DIAGNOSIS — R531 Weakness: Secondary | ICD-10-CM | POA: Diagnosis not present

## 2019-04-23 DIAGNOSIS — Z20828 Contact with and (suspected) exposure to other viral communicable diseases: Secondary | ICD-10-CM | POA: Diagnosis not present

## 2019-04-23 DIAGNOSIS — R262 Difficulty in walking, not elsewhere classified: Secondary | ICD-10-CM | POA: Diagnosis not present

## 2019-04-24 DIAGNOSIS — M6281 Muscle weakness (generalized): Secondary | ICD-10-CM | POA: Diagnosis not present

## 2019-04-24 DIAGNOSIS — R293 Abnormal posture: Secondary | ICD-10-CM | POA: Diagnosis not present

## 2019-04-24 DIAGNOSIS — R278 Other lack of coordination: Secondary | ICD-10-CM | POA: Diagnosis not present

## 2019-04-24 DIAGNOSIS — R531 Weakness: Secondary | ICD-10-CM | POA: Diagnosis not present

## 2019-04-24 DIAGNOSIS — R262 Difficulty in walking, not elsewhere classified: Secondary | ICD-10-CM | POA: Diagnosis not present

## 2019-04-24 NOTE — Progress Notes (Signed)
COMMUNITY PALLIATIVE CARE RN NOTE  PATIENT NAME: Brittany [name] DOB: 15-Dec-1927 MRN: [id number]  PRIMARY CARE PROVIDER: Mayra Neer, MD  RESPONSIBLE PARTY:  Acct ID - Guarantor Home Phone Work Phone Relationship Acct Type  [id number] [name]  Self P/F     [facility], [address], [place], [place]   Covid-19 Pre-screening Negative  PLAN OF CARE and INTERVENTION:  1. ADVANCE CARE PLANNING/GOALS OF CARE: Goal is for patient to remain at current AL facility. 2. PATIENT/CAREGIVER EDUCATION: N/A 3. DISEASE STATUS: Visit requested by facility nurse. She reports that patient continues to decline both physically and cognitively. She is currently being treated for a suspected UTI with Cipro twice daily. She will complete this 5-day course of antibiotics tomorrow. Facility nurse says that her mental status still has not improved. Patient has only received one dose of Valium over the past month, and she also has not been requesting her PRN Norco as she usually does.. She used to consistently request this medication at least twice daily for her back pain, so does not feel that medications are contributing to her increased confusion. Her Buspar was recently cut in half to 7.5 mg twice daily. She was c/o back pain this am, but denies pain at this time because she was sitting still the entire visit. Her back pain mainly occurs with any movement. Upon my arrival, patient is sitting up in her recliner asleep, but easily aroused with verbal stimulation. She is able to answer simple questions for the most part, however there are times where she will not respond to questions at all. This morning, she was able to hold a glass and drink with a straw, but sat her cup down close to the edge of the table. She then proceeded to pick up her bowl of grits, stuck her tongue in it and then tried to drink it, despite the fact she was told these were her grits. She was able to feed  herself afterwards with frequent prompting. She is requiring more assistance with standing/transfers, bathing, dressing and toileting. She was able to ambulate independently in the facility hallway with her walker independently a few weeks ago. She is now being transported via wheelchair. She had a session with Physical Therapy this am, but was only able to perform exercises. Contacted patient's daughter Brittany [name] to provide patient update. She feels that patient seems more confused in the mornings, but this seems to clear up as the day progresses. She states that she speaks with patient about 3 times/per day. She does not feel that there is anything more that the facility should be doing to care for her mom. She feels that her decline is partly due to her age and feels that overtime she will continue to decline. She also feels that patient may be dehydrated in the mornings, and when more hydrated during the day, seems to do better. Brittany [name] also states that patient has had a long history of chronic back pain over the past 25 years and feels that her pain would improve with more movement. Will continue to monitor.   HISTORY OF PRESENT ILLNESS:  This is a 86 yo female who resides at [facility]. Palliative care team following patient for additional support.   CODE STATUS: DNR ADVANCED DIRECTIVES: Y MOST FORM: no PPS: 30%   PHYSICAL EXAM:    LUNGS: clear to auscultation  CARDIAC: Cor RRR EXTREMITIES: No edema SKIN: Exposed skin is dry and intact  NEURO:  Alert and oriented to person/place, intermittent confusion, Vision impairment, requires 1-2 person assistance with transfers   (Duration of visit and documentation 60 minutes)   Daryl Eastern, RN BSN

## 2019-04-25 ENCOUNTER — Emergency Department (HOSPITAL_COMMUNITY): Payer: Medicare Other

## 2019-04-25 ENCOUNTER — Other Ambulatory Visit: Payer: Self-pay

## 2019-04-25 ENCOUNTER — Encounter (HOSPITAL_COMMUNITY): Payer: Self-pay | Admitting: Emergency Medicine

## 2019-04-25 ENCOUNTER — Inpatient Hospital Stay (HOSPITAL_COMMUNITY)
Admission: EM | Admit: 2019-04-25 | Discharge: 2019-05-13 | DRG: 682 | Disposition: E | Payer: Medicare Other | Attending: Internal Medicine | Admitting: Internal Medicine

## 2019-04-25 DIAGNOSIS — I130 Hypertensive heart and chronic kidney disease with heart failure and stage 1 through stage 4 chronic kidney disease, or unspecified chronic kidney disease: Secondary | ICD-10-CM | POA: Diagnosis present

## 2019-04-25 DIAGNOSIS — E860 Dehydration: Secondary | ICD-10-CM | POA: Diagnosis present

## 2019-04-25 DIAGNOSIS — I272 Pulmonary hypertension, unspecified: Secondary | ICD-10-CM | POA: Diagnosis present

## 2019-04-25 DIAGNOSIS — R0602 Shortness of breath: Secondary | ICD-10-CM | POA: Diagnosis not present

## 2019-04-25 DIAGNOSIS — M549 Dorsalgia, unspecified: Secondary | ICD-10-CM | POA: Diagnosis present

## 2019-04-25 DIAGNOSIS — R627 Adult failure to thrive: Secondary | ICD-10-CM | POA: Diagnosis present

## 2019-04-25 DIAGNOSIS — I2510 Atherosclerotic heart disease of native coronary artery without angina pectoris: Secondary | ICD-10-CM | POA: Diagnosis present

## 2019-04-25 DIAGNOSIS — E871 Hypo-osmolality and hyponatremia: Secondary | ICD-10-CM | POA: Diagnosis present

## 2019-04-25 DIAGNOSIS — K5790 Diverticulosis of intestine, part unspecified, without perforation or abscess without bleeding: Secondary | ICD-10-CM | POA: Diagnosis present

## 2019-04-25 DIAGNOSIS — Z818 Family history of other mental and behavioral disorders: Secondary | ICD-10-CM

## 2019-04-25 DIAGNOSIS — Z881 Allergy status to other antibiotic agents status: Secondary | ICD-10-CM

## 2019-04-25 DIAGNOSIS — R410 Disorientation, unspecified: Secondary | ICD-10-CM | POA: Diagnosis not present

## 2019-04-25 DIAGNOSIS — Z515 Encounter for palliative care: Secondary | ICD-10-CM | POA: Diagnosis not present

## 2019-04-25 DIAGNOSIS — R5381 Other malaise: Secondary | ICD-10-CM | POA: Diagnosis not present

## 2019-04-25 DIAGNOSIS — K219 Gastro-esophageal reflux disease without esophagitis: Secondary | ICD-10-CM | POA: Diagnosis present

## 2019-04-25 DIAGNOSIS — Z888 Allergy status to other drugs, medicaments and biological substances status: Secondary | ICD-10-CM

## 2019-04-25 DIAGNOSIS — Z811 Family history of alcohol abuse and dependence: Secondary | ICD-10-CM

## 2019-04-25 DIAGNOSIS — I252 Old myocardial infarction: Secondary | ICD-10-CM

## 2019-04-25 DIAGNOSIS — I4819 Other persistent atrial fibrillation: Secondary | ICD-10-CM

## 2019-04-25 DIAGNOSIS — R001 Bradycardia, unspecified: Secondary | ICD-10-CM | POA: Diagnosis not present

## 2019-04-25 DIAGNOSIS — R531 Weakness: Secondary | ICD-10-CM

## 2019-04-25 DIAGNOSIS — Z20822 Contact with and (suspected) exposure to covid-19: Secondary | ICD-10-CM | POA: Diagnosis present

## 2019-04-25 DIAGNOSIS — Z6825 Body mass index (BMI) 25.0-25.9, adult: Secondary | ICD-10-CM

## 2019-04-25 DIAGNOSIS — I5032 Chronic diastolic (congestive) heart failure: Secondary | ICD-10-CM | POA: Diagnosis present

## 2019-04-25 DIAGNOSIS — Z974 Presence of external hearing-aid: Secondary | ICD-10-CM | POA: Diagnosis not present

## 2019-04-25 DIAGNOSIS — N179 Acute kidney failure, unspecified: Principal | ICD-10-CM | POA: Diagnosis present

## 2019-04-25 DIAGNOSIS — Z8249 Family history of ischemic heart disease and other diseases of the circulatory system: Secondary | ICD-10-CM

## 2019-04-25 DIAGNOSIS — Z880 Allergy status to penicillin: Secondary | ICD-10-CM

## 2019-04-25 DIAGNOSIS — G8929 Other chronic pain: Secondary | ICD-10-CM | POA: Diagnosis present

## 2019-04-25 DIAGNOSIS — E785 Hyperlipidemia, unspecified: Secondary | ICD-10-CM | POA: Diagnosis present

## 2019-04-25 DIAGNOSIS — N3010 Interstitial cystitis (chronic) without hematuria: Secondary | ICD-10-CM | POA: Diagnosis present

## 2019-04-25 DIAGNOSIS — Z952 Presence of prosthetic heart valve: Secondary | ICD-10-CM

## 2019-04-25 DIAGNOSIS — H3530 Unspecified macular degeneration: Secondary | ICD-10-CM | POA: Diagnosis present

## 2019-04-25 DIAGNOSIS — I129 Hypertensive chronic kidney disease with stage 1 through stage 4 chronic kidney disease, or unspecified chronic kidney disease: Secondary | ICD-10-CM | POA: Diagnosis present

## 2019-04-25 DIAGNOSIS — Z7901 Long term (current) use of anticoagulants: Secondary | ICD-10-CM

## 2019-04-25 DIAGNOSIS — Z8673 Personal history of transient ischemic attack (TIA), and cerebral infarction without residual deficits: Secondary | ICD-10-CM

## 2019-04-25 DIAGNOSIS — Z66 Do not resuscitate: Secondary | ICD-10-CM | POA: Diagnosis present

## 2019-04-25 DIAGNOSIS — F411 Generalized anxiety disorder: Secondary | ICD-10-CM | POA: Diagnosis not present

## 2019-04-25 DIAGNOSIS — H9193 Unspecified hearing loss, bilateral: Secondary | ICD-10-CM | POA: Diagnosis present

## 2019-04-25 DIAGNOSIS — Z882 Allergy status to sulfonamides status: Secondary | ICD-10-CM

## 2019-04-25 DIAGNOSIS — G9341 Metabolic encephalopathy: Secondary | ICD-10-CM | POA: Diagnosis present

## 2019-04-25 DIAGNOSIS — N183 Chronic kidney disease, stage 3 unspecified: Secondary | ICD-10-CM | POA: Diagnosis present

## 2019-04-25 DIAGNOSIS — M21379 Foot drop, unspecified foot: Secondary | ICD-10-CM | POA: Diagnosis present

## 2019-04-25 DIAGNOSIS — E861 Hypovolemia: Secondary | ICD-10-CM | POA: Diagnosis present

## 2019-04-25 DIAGNOSIS — Z800 Family history of malignant neoplasm of digestive organs: Secondary | ICD-10-CM

## 2019-04-25 DIAGNOSIS — E876 Hypokalemia: Secondary | ICD-10-CM | POA: Diagnosis present

## 2019-04-25 DIAGNOSIS — I959 Hypotension, unspecified: Secondary | ICD-10-CM | POA: Diagnosis not present

## 2019-04-25 DIAGNOSIS — K449 Diaphragmatic hernia without obstruction or gangrene: Secondary | ICD-10-CM | POA: Diagnosis present

## 2019-04-25 DIAGNOSIS — I482 Chronic atrial fibrillation, unspecified: Secondary | ICD-10-CM | POA: Diagnosis present

## 2019-04-25 DIAGNOSIS — R296 Repeated falls: Secondary | ICD-10-CM | POA: Diagnosis present

## 2019-04-25 DIAGNOSIS — R404 Transient alteration of awareness: Secondary | ICD-10-CM | POA: Diagnosis not present

## 2019-04-25 DIAGNOSIS — J439 Emphysema, unspecified: Secondary | ICD-10-CM | POA: Diagnosis present

## 2019-04-25 DIAGNOSIS — I4891 Unspecified atrial fibrillation: Secondary | ICD-10-CM | POA: Diagnosis not present

## 2019-04-25 DIAGNOSIS — Z87891 Personal history of nicotine dependence: Secondary | ICD-10-CM

## 2019-04-25 DIAGNOSIS — Z885 Allergy status to narcotic agent status: Secondary | ICD-10-CM

## 2019-04-25 DIAGNOSIS — H548 Legal blindness, as defined in USA: Secondary | ICD-10-CM | POA: Diagnosis present

## 2019-04-25 LAB — COMPREHENSIVE METABOLIC PANEL
ALT: 12 U/L (ref 0–44)
AST: 26 U/L (ref 15–41)
Albumin: 2.7 g/dL — ABNORMAL LOW (ref 3.5–5.0)
Alkaline Phosphatase: 118 U/L (ref 38–126)
Anion gap: 10 (ref 5–15)
BUN: 19 mg/dL (ref 8–23)
CO2: 30 mmol/L (ref 22–32)
Calcium: 13.6 mg/dL (ref 8.9–10.3)
Chloride: 89 mmol/L — ABNORMAL LOW (ref 98–111)
Creatinine, Ser: 1.4 mg/dL — ABNORMAL HIGH (ref 0.44–1.00)
GFR calc Af Amer: 39 mL/min — ABNORMAL LOW (ref >60)
GFR calc non Af Amer: 33 mL/min — ABNORMAL LOW (ref >60)
Glucose, Bld: 133 mg/dL — ABNORMAL HIGH (ref 70–99)
Potassium: 3.3 mmol/L — ABNORMAL LOW (ref 3.5–5.1)
Sodium: 129 mmol/L — ABNORMAL LOW (ref 135–145)
Total Bilirubin: 0.9 mg/dL (ref 0.3–1.2)
Total Protein: 8.6 g/dL — ABNORMAL HIGH (ref 6.5–8.1)

## 2019-04-25 LAB — CBC WITH DIFFERENTIAL/PLATELET
Abs Immature Granulocytes: 0.05 10*3/uL (ref 0.00–0.07)
Basophils Absolute: 0.1 10*3/uL (ref 0.0–0.1)
Basophils Relative: 1 %
Eosinophils Absolute: 0.4 10*3/uL (ref 0.0–0.5)
Eosinophils Relative: 3 %
HCT: 40.7 % (ref 36.0–46.0)
Hemoglobin: 13.1 g/dL (ref 12.0–15.0)
Immature Granulocytes: 0 %
Lymphocytes Relative: 16 %
Lymphs Abs: 1.9 10*3/uL (ref 0.7–4.0)
MCH: 28.4 pg (ref 26.0–34.0)
MCHC: 32.2 g/dL (ref 30.0–36.0)
MCV: 88.3 fL (ref 80.0–100.0)
Monocytes Absolute: 1.9 10*3/uL — ABNORMAL HIGH (ref 0.1–1.0)
Monocytes Relative: 15 %
Neutro Abs: 7.9 10*3/uL — ABNORMAL HIGH (ref 1.7–7.7)
Neutrophils Relative %: 65 %
Platelets: 236 10*3/uL (ref 150–400)
RBC: 4.61 MIL/uL (ref 3.87–5.11)
RDW: 14.6 % (ref 11.5–15.5)
WBC: 12.2 10*3/uL — ABNORMAL HIGH (ref 4.0–10.5)
nRBC: 0 % (ref 0.0–0.2)

## 2019-04-25 LAB — TSH
TSH: 1.159 u[IU]/mL (ref 0.350–4.500)
TSH: 1.164 u[IU]/mL (ref 0.350–4.500)

## 2019-04-25 LAB — URINALYSIS, ROUTINE W REFLEX MICROSCOPIC
Bilirubin Urine: NEGATIVE
Glucose, UA: NEGATIVE mg/dL
Ketones, ur: NEGATIVE mg/dL
Leukocytes,Ua: NEGATIVE
Nitrite: NEGATIVE
Protein, ur: NEGATIVE mg/dL
Specific Gravity, Urine: 1.008 (ref 1.005–1.030)
pH: 6 (ref 5.0–8.0)

## 2019-04-25 LAB — I-STAT CHEM 8, ED
BUN: 17 mg/dL (ref 8–23)
Calcium, Ion: 1.85 mmol/L (ref 1.15–1.40)
Chloride: 93 mmol/L — ABNORMAL LOW (ref 98–111)
Creatinine, Ser: 1.4 mg/dL — ABNORMAL HIGH (ref 0.44–1.00)
Glucose, Bld: 105 mg/dL — ABNORMAL HIGH (ref 70–99)
HCT: 42 % (ref 36.0–46.0)
Hemoglobin: 14.3 g/dL (ref 12.0–15.0)
Potassium: 3.2 mmol/L — ABNORMAL LOW (ref 3.5–5.1)
Sodium: 133 mmol/L — ABNORMAL LOW (ref 135–145)
TCO2: 35 mmol/L — ABNORMAL HIGH (ref 22–32)

## 2019-04-25 LAB — PHOSPHORUS: Phosphorus: 3.5 mg/dL (ref 2.5–4.6)

## 2019-04-25 LAB — ETHANOL: Alcohol, Ethyl (B): <10 mg/dL (ref <10)

## 2019-04-25 LAB — AMMONIA: Ammonia: 14 umol/L (ref 9–35)

## 2019-04-25 LAB — LIPASE, BLOOD: Lipase: 45 U/L (ref 11–51)

## 2019-04-25 LAB — RESPIRATORY PANEL BY RT PCR (FLU A&B, COVID)
Influenza A by PCR: NEGATIVE
Influenza B by PCR: NEGATIVE
SARS Coronavirus 2 by RT PCR: NEGATIVE

## 2019-04-25 LAB — MAGNESIUM: Magnesium: 1.4 mg/dL — ABNORMAL LOW (ref 1.7–2.4)

## 2019-04-25 LAB — PROTIME-INR
INR: 4.1 (ref 0.8–1.2)
Prothrombin Time: 39.7 seconds — ABNORMAL HIGH (ref 11.4–15.2)

## 2019-04-25 LAB — VITAMIN B12: Vitamin B-12: 246 pg/mL (ref 180–914)

## 2019-04-25 MED ORDER — SENNOSIDES-DOCUSATE SODIUM 8.6-50 MG PO TABS: 1.0000 | ORAL_TABLET | Freq: Every evening | ORAL | Status: DC | PRN

## 2019-04-25 MED ORDER — POLYVINYL ALCOHOL 1.4 % OP SOLN
1.0000 [drp] | OPHTHALMIC | Status: DC | PRN
  Filled 2019-04-25: qty 15

## 2019-04-25 MED ORDER — CARBOXYMETHYLCELLULOSE SODIUM 1 % OP SOLN: 1.0000 [drp] | Freq: Every day | OPHTHALMIC | Status: DC

## 2019-04-25 MED ORDER — MORPHINE SULFATE (PF) 2 MG/ML IV SOLN: 1.0000 mg | INTRAVENOUS | Status: DC | PRN

## 2019-04-25 MED ORDER — ACETAMINOPHEN 500 MG PO TABS
1000.0000 mg | ORAL_TABLET | Freq: Three times a day (TID) | ORAL | Status: DC
  Administered 2019-04-25 – 2019-04-27 (×5): 1000 mg via ORAL
  Filled 2019-04-25 (×5): qty 2

## 2019-04-25 MED ORDER — FENTANYL CITRATE (PF) 100 MCG/2ML IJ SOLN
12.5000 ug | Freq: Once | INTRAMUSCULAR | Status: DC
  Filled 2019-04-25: qty 2

## 2019-04-25 MED ORDER — ONDANSETRON HCL 4 MG PO TABS: 4.0000 mg | ORAL_TABLET | Freq: Four times a day (QID) | ORAL | Status: DC | PRN

## 2019-04-25 MED ORDER — DILTIAZEM HCL 60 MG PO TABS
120.0000 mg | ORAL_TABLET | Freq: Two times a day (BID) | ORAL | Status: DC
  Administered 2019-04-25 – 2019-04-27 (×4): 120 mg via ORAL
  Filled 2019-04-25 (×6): qty 2

## 2019-04-25 MED ORDER — DOCUSATE SODIUM 100 MG PO CAPS
100.0000 mg | ORAL_CAPSULE | Freq: Two times a day (BID) | ORAL | Status: DC
  Administered 2019-04-25 – 2019-04-26 (×3): 100 mg via ORAL
  Filled 2019-04-25 (×4): qty 1

## 2019-04-25 MED ORDER — FLEET ENEMA 7-19 GM/118ML RE ENEM: 1.0000 | ENEMA | Freq: Once | RECTAL | Status: DC | PRN

## 2019-04-25 MED ORDER — MAGNESIUM SULFATE 2 GM/50ML IV SOLN
2.0000 g | Freq: Once | INTRAVENOUS | Status: AC
  Administered 2019-04-25: 2 g via INTRAVENOUS
  Filled 2019-04-25: qty 50

## 2019-04-25 MED ORDER — POTASSIUM CHLORIDE CRYS ER 20 MEQ PO TBCR
40.0000 meq | EXTENDED_RELEASE_TABLET | Freq: Once | ORAL | Status: AC
  Administered 2019-04-25: 40 meq via ORAL
  Filled 2019-04-25: qty 2

## 2019-04-25 MED ORDER — NITROGLYCERIN 0.4 MG SL SUBL: 0.4000 mg | SUBLINGUAL_TABLET | SUBLINGUAL | Status: DC | PRN

## 2019-04-25 MED ORDER — FENTANYL CITRATE (PF) 100 MCG/2ML IJ SOLN
25.0000 ug | INTRAMUSCULAR | Status: DC | PRN
  Administered 2019-04-25 – 2019-04-26 (×2): 25 ug via INTRAVENOUS
  Filled 2019-04-25 (×2): qty 2

## 2019-04-25 MED ORDER — SODIUM CHLORIDE 0.9 % IV SOLN: INTRAVENOUS | Status: DC

## 2019-04-25 MED ORDER — POTASSIUM CHLORIDE 10 MEQ/100ML IV SOLN
10.0000 meq | INTRAVENOUS | Status: DC
  Administered 2019-04-25: 10 meq via INTRAVENOUS
  Filled 2019-04-25: qty 100

## 2019-04-25 MED ORDER — DICLOFENAC SODIUM 1 % EX GEL
2.0000 g | Freq: Four times a day (QID) | CUTANEOUS | Status: DC
  Administered 2019-04-25 – 2019-04-27 (×5): 2 g via TOPICAL
  Filled 2019-04-25: qty 100

## 2019-04-25 MED ORDER — SODIUM CHLORIDE 0.9 % IV BOLUS
1000.0000 mL | Freq: Once | INTRAVENOUS | Status: AC
  Administered 2019-04-25: 1000 mL via INTRAVENOUS

## 2019-04-25 MED ORDER — BISACODYL 10 MG RE SUPP: 10.0000 mg | Freq: Every day | RECTAL | Status: DC | PRN

## 2019-04-25 MED ORDER — ONDANSETRON HCL 4 MG/2ML IJ SOLN: 4.0000 mg | Freq: Four times a day (QID) | INTRAMUSCULAR | Status: DC | PRN

## 2019-04-25 MED ORDER — BUSPIRONE HCL 10 MG PO TABS
15.0000 mg | ORAL_TABLET | Freq: Two times a day (BID) | ORAL | Status: DC
  Administered 2019-04-25 – 2019-04-27 (×4): 15 mg via ORAL
  Filled 2019-04-25 (×4): qty 2

## 2019-04-25 MED ORDER — PANTOPRAZOLE SODIUM 40 MG PO TBEC
40.0000 mg | DELAYED_RELEASE_TABLET | Freq: Two times a day (BID) | ORAL | Status: DC
  Administered 2019-04-25 – 2019-04-27 (×4): 40 mg via ORAL
  Filled 2019-04-25 (×4): qty 1

## 2019-04-25 MED ORDER — POTASSIUM CHLORIDE IN NACL 20-0.9 MEQ/L-% IV SOLN
INTRAVENOUS | Status: DC
  Filled 2019-04-25 (×2): qty 1000

## 2019-04-25 MED ORDER — ALBUTEROL SULFATE (2.5 MG/3ML) 0.083% IN NEBU: 2.5000 mg | INHALATION_SOLUTION | RESPIRATORY_TRACT | Status: DC | PRN

## 2019-04-25 MED ORDER — LABETALOL HCL 5 MG/ML IV SOLN
10.0000 mg | INTRAVENOUS | Status: DC | PRN
  Administered 2019-04-26: 10 mg via INTRAVENOUS
  Filled 2019-04-25 (×3): qty 4

## 2019-04-25 MED ORDER — CYCLOSPORINE 0.05 % OP EMUL
1.0000 [drp] | Freq: Two times a day (BID) | OPHTHALMIC | Status: DC
  Administered 2019-04-25 – 2019-04-27 (×4): 1 [drp] via OPHTHALMIC
  Filled 2019-04-25 (×7): qty 1

## 2019-04-25 MED ORDER — ATORVASTATIN CALCIUM 20 MG PO TABS
20.0000 mg | ORAL_TABLET | Freq: Every day | ORAL | Status: DC
  Administered 2019-04-26 – 2019-04-27 (×2): 20 mg via ORAL
  Filled 2019-04-25: qty 2
  Filled 2019-04-25 (×2): qty 1

## 2019-04-25 NOTE — Progress Notes (Signed)
ANTICOAGULATION CONSULT NOTE - Initial Consult  Pharmacy Consult for coumadin Indication: AVR/Afib  Allergies  Allergen Reactions  . Amlodipine Swelling and Other (See Comments)     Unspecified swelling reaction  . Amoxicillin Diarrhea, Nausea And Vomiting and Other (See Comments)    Has patient had a PCN reaction causing immediate rash, facial/tongue/throat swelling, SOB or lightheadedness with hypotension: No Has patient had a PCN reaction causing severe rash involving mucus membranes or skin necrosis: No Has patient had a PCN reaction that required hospitalization: No Has patient had a PCN reaction occurring within the last 10 years: No If all of the above answers are "NO", then may proceed with Cephalosporin use.  . Carafate [Sucralfate] Swelling and Other (See Comments)    Reaction:  Knee swelling/redness   . Cephalexin Diarrhea  . Ciprofloxacin   . Codeine Nausea And Vomiting  . Irbesartan Swelling and Other (See Comments)    Reaction:  Facial/hand swelling and numbness   . Morphine And Related Other (See Comments)    Pt states that she has a history of addiction with this medication.      . Neurontin [Gabapentin] Swelling and Other (See Comments)    Reaction:  Leg swelling   . Nitrofurantoin Monohyd Macro Diarrhea and Nausea And Vomiting  . Penicillins   . Prednisone Other (See Comments)    Reaction:  Elevated BP  . Sulfa Antibiotics Rash    Patient Measurements:   Heparin Dosing Weight:   Vital Signs: Temp: 97.5 F (36.4 C) (01/14 1512) Temp Source: Oral (01/14 1512) BP: 159/107 (01/14 1827) Pulse Rate: 83 (01/14 1827)  Labs: Recent Labs    05/12/2019 1538 04/29/2019 1839  HGB 13.1 14.3  HCT 40.7 42.0  PLT 236  --   CREATININE 1.40* 1.40*    CrCl cannot be calculated (Unknown ideal weight.).   Medical History: Past Medical History:  Diagnosis Date  . Abnormal liver diagnostic imaging    suspected cirrhosis  . Arthritis   . Atrial fibrillation ([place])    . Blind    legally  . Chronic back pain   . COPD (chronic obstructive pulmonary disease) ([place])   . Diverticulosis   . Dyslipidemia    takes Lipitor daily  . Dysrhythmia    Atrial Fibrillation  . Foot drop    SINCE 1987  . GERD (gastroesophageal reflux disease)   . H/O hiatal hernia   . Hearing impaired    Bilateral hearing aids  . History of bacterial endocarditis   . History of gastritis   . History of stomach ulcers    many yrs ago  . History of TIA (transient ischemic attack)    2013-- RESIDUAL PERIPHERAL VISION RIGHT EYE--  RESOLVED  . Hypertension   . IC (interstitial cystitis)   . Macular degeneration   . Myocardial infarction ([place])   . Pulmonary hypertension ([place]) 10/2013   Based on TTE  . S/P aortic valve replacement    2005  . Stroke [facility])    TIA, no preherial vison   . Urine incontinence   . Wears dentures   . Wears glasses     Assessment: 86 yo F on coumadin PTA for Afib and AVR on 2005. Coumadin clinic pt> last INR 04/18/19 = 3.5, dose hels then 2.5 qd X 5 mg Mondays and Fridays. INR goal 2-3 per coumadin clinic and SNF MAR.  Last dose 1/13.  SCr 1.4, Hg 14.3, PLTC WNL, Pt to be admitted for AMS & FTT.  Admit INR: 4.1 - above goal of 2-3.   Goal of Therapy:  INR 2-3 - per coumadin clinic Monitor platelets by anticoagulation protocol: Yes   Plan:  No coumadin tonight Daily INR  Eudelia Bunch, Pharm.D [phone] 04/29/2019 6:54 PM

## 2019-04-25 NOTE — ED Notes (Signed)
XR at bedside

## 2019-04-25 NOTE — H&P (Addendum)
History and Physical    Brittany [name] [id number] DOB: 1927/12/09 DOA: 05/06/2019  PCP: Mayra Neer, MD Patient coming from: [facility].  Independently ambulates at baseline.  Oriented x4 at baseline.  Chief Complaint: Altered mental status, deterioration and decreased appetite  HPI: Brittany [name] is a 86 y.o. female with history of A. fib/AVR on warfarin, diastolic CHF, pulm HTN, COPD, chronic back pain and recent fall presenting with altered mental status, poor p.o. intake and generalized weakness.  Patient oriented to self and family and not able to provide history at this time.  History provided by patient's daughter at bedside.  Patient had full on 04/11/2019 and evaluated in ED. had multiple imaging which did not reveal acute osseous finding.  She was discharged home, and home health was ordered.  However, he labs were significant for AKI and mild hypercalcemia at that time.   Patient has been doing poorly since that time.  However, she worked well with physical therapy about 4 days ago.  Done here p.o. intake gotten worse in the last 2 days.  She became weaker and confused.  Recently tested negative for COVID-19.  Denies fever, chills, cough, shortness of breath, nausea, vomiting, abdominal pain or UTI symptoms.  Denies slurred speech, facial droop, focal weakness, numbness or tingling.  She has history of "multiple TIAs".  She has chronic back pain unchanged from baseline.  She denies history of dementia.  Independently ambulates at baseline.  She is DNR/DNI.  Daughter is open to comfort measures if no improvement with IV fluids.  In ED, hemodynamically stable.  Normal saturation on RA. Na 129.  K3.3.  Creatinine 1.4 (normal baseline).  Calcium 13.6 and adjusted to 14.6 for low albumin.  Ionized calcium 1.86.  WBC 12.2.  Otherwise, CMP and CBC without significant finding.  Lipase normal.  Influenza and COVID-19 PCR negative.  UA with many bacteria and small Hgb  but no LE or nitrites.  CXR without significant finding.  EKG with A. fib without RVR.  NS bolus and maintenance ordered and hospitalist service was called for admission for AKI, hypercalcemia and FTT.  ROS All review of system negative except for pertinent positives and negatives as history of present illness above. PMH Past Medical History:  Diagnosis Date  . Abnormal liver diagnostic imaging    suspected cirrhosis  . Arthritis   . Atrial fibrillation ([place])   . Blind    legally  . Chronic back pain   . COPD (chronic obstructive pulmonary disease) ([place])   . Diverticulosis   . Dyslipidemia    takes Lipitor daily  . Dysrhythmia    Atrial Fibrillation  . Foot drop    SINCE 1987  . GERD (gastroesophageal reflux disease)   . H/O hiatal hernia   . Hearing impaired    Bilateral hearing aids  . History of bacterial endocarditis   . History of gastritis   . History of stomach ulcers    many yrs ago  . History of TIA (transient ischemic attack)    2013-- RESIDUAL PERIPHERAL VISION RIGHT EYE--  RESOLVED  . Hypertension   . IC (interstitial cystitis)   . Macular degeneration   . Myocardial infarction ([place])   . Pulmonary hypertension ([place]) 10/2013   Based on TTE  . S/P aortic valve replacement    2005  . Stroke [facility])    TIA, no preherial vison   . Urine incontinence   . Wears dentures   . Wears glasses  PSH Past Surgical History:  Procedure Laterality Date  . ABDOMINAL HYSTERECTOMY  1967  . AORTIC VALVE REPLACEMENT  09-29-2003    [facility] pericardial tissue valve  . APPENDECTOMY  1933  . APPLICATION OF A-CELL OF EXTREMITY Left 09/06/2018   Procedure: DEBRIDMENT LEFT FOOT WITH ACELL PLACEMENT;  Surgeon: Wallace Going, DO;  Location: [place];  Service: Plastics;  Laterality: Left;  . APPLICATION OF A-CELL OF EXTREMITY Left 10/31/2018   Procedure: APPLICATION OF A-CELL OF EXTREMITY;  Surgeon: Wallace Going, DO;  Location: [place];  Service: Plastics;   Laterality: Left;  . BIOPSY  02/01/2018   Procedure: BIOPSY;  Surgeon: Rush Landmark Telford Nab., MD;  Location: [place];  Service: Gastroenterology;;  . CARDIAC CATHETERIZATION  07-15-2003 DR HELEN PRESTON   MODERATE TO MODERATELY SEVERE CALCIFIC AORTIC STENOSIS/  NORMAL CORONARY ARTERIES  . CARDIOVASCULAR STRESS TEST  01-30-2012  DR NASHER   NORMAL NUCLEAR STUDY/  EF 73%/  NORMAL LVF  . CARPAL TUNNEL RELEASE Bilateral   . CATARACT EXTRACTION W/ INTRAOCULAR LENS  IMPLANT, BILATERAL    . CHOLECYSTECTOMY  1993  . CYSTO WITH HYDRODISTENSION N/A 02/05/2013   Procedure: CYSTOSCOPY/HYDRODISTENSION;  Surgeon: Irine Seal, MD;  Location: [facility];  Service: Urology;  Laterality: N/A;  . CYSTO/ HYDRODISTENTION/ INSTILLATION CLORPACTIN  MULTIPLE  last one 2009  . DILATION AND CURETTAGE OF UTERUS    . ERCP N/A 08/10/2012   Procedure: ENDOSCOPIC RETROGRADE CHOLANGIOPANCREATOGRAPHY (ERCP);  Surgeon: Inda Castle, MD;  Location: [place];  Service: Gastroenterology;  Laterality: N/A;  . ESOPHAGOGASTRODUODENOSCOPY (EGD) WITH PROPOFOL N/A 08/18/2014   Procedure: ESOPHAGOGASTRODUODENOSCOPY (EGD) WITH PROPOFOL;  Surgeon: Inda Castle, MD;  Location: [facility];  Service: Endoscopy;  Laterality: N/A;  . ESOPHAGOGASTRODUODENOSCOPY (EGD) WITH PROPOFOL N/A 02/01/2018   Procedure: ESOPHAGOGASTRODUODENOSCOPY (EGD) WITH PROPOFOL;  Surgeon: Rush Landmark Telford Nab., MD;  Location: [place];  Service: Gastroenterology;  Laterality: N/A;  . FOREIGN BODY REMOVAL  02/01/2018   Procedure: FOREIGN BODY REMOVAL;  Surgeon: Rush Landmark Telford Nab., MD;  Location: [place];  Service: Gastroenterology;;  . INCISION AND DRAINAGE OF WOUND Left 10/31/2018   Procedure: DEBRIDEMENT LEFT FOOT WOUND;  Surgeon: Wallace Going, DO;  Location: [place];  Service: Plastics;  Laterality: Left;  . LEFT HEART CATHETERIZATION WITH CORONARY ANGIOGRAM N/A 10/18/2013   Procedure: LEFT HEART CATHETERIZATION WITH  CORONARY ANGIOGRAM;  Surgeon: Jettie Booze, MD;  Location[facility];  Service: Cardiovascular;  Laterality: N/A;  . LUMBAR [place] &  2007  . MACROPLASTIQUE URETHRAL IMPLANTATION  08-27-2009  . MINI-OPEN RIGHT ROTATOR CUFF REPAIR  07-12-2001  . RIGHT SHOULDER ARTHROSCOPY W/ DEBRIDEMENT ROTATOR CUFF AND LABRAL TEAR/ ACROMINOPLASTY/ DISTAL CLAVICLE EXCISION/ CA LIGAMENT RELEASE  10-08-1999  . TEE WITHOUT CARDIOVERSION N/A 06/20/2018   Procedure: TRANSESOPHAGEAL ECHOCARDIOGRAM (TEE);  Surgeon: Jerline Pain, MD;  Location: [facility] ENDOSCOPY;  Service: Cardiovascular;  Laterality: N/A;  . TONSILLECTOMY AND ADENOIDECTOMY    . TRANSTHORACIC ECHOCARDIOGRAM  08-10-2011   MILD LVH/  EF 55-60%/  NORMAL AVR TISSUE/ MILD MR/  MODERATE DILATED LA/  MILD DILATED RA  . TRANSVAGINAL TAPE PROCEDURE  07-30-2002  . VEIN LIGATION  1969   RIGHT LOWER LEG   Fam HX Family History  Problem Relation Age of Onset  . Heart disease Father   . Stomach cancer Maternal Grandmother   . Esophageal cancer Maternal Grandfather   . Bipolar disorder Son        Committed Suicide  .  Coronary artery disease Brother   . Bipolar disorder Brother        commited suiside at 54yo  . Alcohol abuse Sister        sister #1  . Alcohol abuse Sister        sister #2  . Colon cancer Maternal Aunt   . Lung disease Neg Hx     Social Hx  reports that she quit smoking about 29 years ago. Her smoking use included cigarettes. She started smoking about 70 years ago. She has a 20.00 pack-year smoking history. She has never used smokeless tobacco. She reports that she does not drink alcohol or use drugs.  Allergy Allergies  Allergen Reactions  . Amlodipine Swelling and Other (See Comments)     Unspecified swelling reaction  . Amoxicillin Diarrhea, Nausea And Vomiting and Other (See Comments)    Has patient had a PCN reaction causing immediate rash, facial/tongue/throat swelling, SOB or lightheadedness with hypotension:  No Has patient had a PCN reaction causing severe rash involving mucus membranes or skin necrosis: No Has patient had a PCN reaction that required hospitalization: No Has patient had a PCN reaction occurring within the last 10 years: No If all of the above answers are "NO", then may proceed with Cephalosporin use.  . Carafate [Sucralfate] Swelling and Other (See Comments)    Reaction:  Knee swelling/redness   . Cephalexin Diarrhea  . Ciprofloxacin   . Codeine Nausea And Vomiting  . Irbesartan Swelling and Other (See Comments)    Reaction:  Facial/hand swelling and numbness   . Morphine And Related Other (See Comments)    Pt states that she has a history of addiction with this medication.      . Neurontin [Gabapentin] Swelling and Other (See Comments)    Reaction:  Leg swelling   . Nitrofurantoin Monohyd Macro Diarrhea and Nausea And Vomiting  . Penicillins   . Prednisone Other (See Comments)    Reaction:  Elevated BP  . Sulfa Antibiotics Rash   Home Meds Prior to Admission medications   Medication Sig Start Date End Date Taking? Authorizing Provider  acetaminophen (TYLENOL) 500 MG tablet Take 500 mg by mouth every 6 (six) hours as needed (for pain/fever).   Yes [provider]  albuterol (VENTOLIN HFA) 108 (90 Base) MCG/ACT inhaler Inhale 1 puff into the lungs every 4 (four) hours as needed for wheezing or shortness of breath.   Yes [provider]  atorvastatin (LIPITOR) 20 MG tablet Take 20 mg by mouth daily.   Yes [provider]  busPIRone (BUSPAR) 15 MG tablet Take 1 tablet (15 mg total) by mouth 2 (two) times daily. PLEASE SCHEDULE AN OFFICE VISIT FOR FURTHER REFILLS: [phone] 12/18/18  Yes Armbruster, Carlota Raspberry, MD  carboxymethylcellulose 1 % ophthalmic solution Place 1 drop into both eyes at bedtime.   Yes [provider]  cycloSPORINE (RESTASIS) 0.05 % ophthalmic emulsion Place 1 drop into both eyes 2 (two) times daily.   Yes [provider]  diazepam (VALIUM) 5 MG tablet Take 5 mg by mouth at bedtime as needed for anxiety.   Yes [provider]  diltiazem (CARDIZEM) 120 MG tablet Take 120 mg by mouth 2 (two) times daily.   Yes [provider]  Eyelid Cleansers (OCUSOFT LID SCRUB EX) Apply 1 application topically 2 (two) times a day.   Yes [provider]  HYDROcodone-acetaminophen (NORCO/VICODIN) 5-325 MG tablet Take 1 tablet by mouth every 6 (six) hours as  needed for severe pain. 02/02/18  Yes Aline August, MD  Multiple Vitamins-Minerals (PRESERVISION AREDS 2 PO) Take 1 tablet by mouth daily.   Yes [provider]  nitroGLYCERIN (NITROSTAT) 0.4 MG SL tablet Place 1 tablet (0.4 mg total) under the tongue every 5 (five) minutes as needed for chest pain. 02/02/18  Yes Aline August, MD  ondansetron (ZOFRAN) 4 MG tablet Take 4 mg by mouth every 4 (four) hours as needed for nausea or vomiting.   Yes [provider]  pantoprazole (PROTONIX) 40 MG tablet Take 40 mg by mouth 2 (two) times daily. 06/18/18  Yes [provider]  polyethylene glycol (MIRALAX / GLYCOLAX) 17 g packet Take 17 g by mouth daily as needed for mild constipation.   Yes [provider]  Polyvinyl Alcohol-Povidone (REFRESH OP) Place 1 drop into both eyes 2 (two) times daily.   Yes [provider]  potassium chloride (K-DUR) 10 MEQ tablet Take 10 mEq by mouth daily.    Yes [provider]  torsemide (DEMADEX) 10 MG tablet Take 10 mg by mouth daily.    Yes [provider]  trolamine salicylate (ASPERCREME) 10 % cream Apply 1 application topically 2 (two) times daily as needed for muscle pain (right hip).   Yes [provider]  warfarin (COUMADIN) 5 MG tablet Take 1/2- 1 tablet daily Patient taking differently: Take 2.5-5 mg by mouth See admin instructions. 2.5mg  on Tues,Wed,Thurs,Sat & Sun 5mg  on Friday, Monday 04/11/19  Yes Nahser, Wonda Cheng, MD    Physical  Exam: Vitals:   05/10/2019 1512  BP: (!) 128/58  Pulse: (!) 55  Resp: 20  Temp: (!) 97.5 F (36.4 C)  TempSrc: Oral  SpO2: 96%    GENERAL: No apparent distress.  Appears tired. HEENT: MMM.  Vision and hearing grossly intact.  NECK: Supple.  No apparent JVD.  No meningism. RESP: On RA.  No IWOB.  Fair aeration bilaterally. CVS:  RRR . Heart sounds normal.  ABD/GI/GU: Bowel sounds present. Soft. Non tender.  MSK/EXT:  Moves extremities. No apparent deformity or edema.  SKIN: no apparent skin lesion or wound NEURO: Awake, alert and oriented self and family.  Follows some command.  No apparent focal neuro deficit. PSYCH: Calm. Normal affect.   Personally Reviewed Radiological Exams DG Chest Port 1 View  Result Date: 05/07/2019 CLINICAL DATA:  ALOC short of breath EXAM: PORTABLE CHEST 1 VIEW COMPARISON:  06/24/2018 FINDINGS: Post sternotomy changes and valve prosthesis. Trace left pleural effusion. Enlarged cardiomediastinal silhouette without overt failure. Aortic atherosclerosis. No pneumothorax. IMPRESSION: 1. Suspect trace left effusion. Small foci of atelectasis at the bases. 2. Mild cardiomegaly Electronically Signed   By: Donavan Foil M.D.   On: 04/12/2019 15:50     Personally Reviewed Labs: CBC: Recent Labs  Lab 05/09/2019 1538  WBC 12.2*  NEUTROABS 7.9*  HGB 13.1  HCT 40.7  MCV 88.3  PLT [id number]   Basic Metabolic Panel: Recent Labs  Lab 04/29/2019 1538  NA 129*  K 3.3*  CL 89*  CO2 30  GLUCOSE 133*  BUN 19  CREATININE 1.40*  CALCIUM 13.6*   GFR: CrCl cannot be calculated (Unknown ideal weight.). Liver Function Tests: Recent Labs  Lab 05/08/2019 1538  AST 26  ALT 12  ALKPHOS 118  BILITOT 0.9  PROT 8.6*  ALBUMIN 2.7*   Recent Labs  Lab 04/29/2019 1538  LIPASE 45   No results for input(s): AMMONIA in the last 168 hours. Coagulation Profile: No results  for input(s): INR, PROTIME in the last 168 hours. Cardiac Enzymes: No results for input(s): CKTOTAL,  CKMB, CKMBINDEX, TROPONINI in the last 168 hours. BNP (last 3 results) No results for input(s): PROBNP in the last 8760 hours. HbA1C: No results for input(s): HGBA1C in the last 72 hours. CBG: No results for input(s): GLUCAP in the last 168 hours. Lipid Profile: No results for input(s): CHOL, HDL, LDLCALC, TRIG, CHOLHDL, LDLDIRECT in the last 72 hours. Thyroid Function Tests: No results for input(s): TSH, T4TOTAL, FREET4, T3FREE, THYROIDAB in the last 72 hours. Anemia Panel: No results for input(s): VITAMINB12, FOLATE, FERRITIN, TIBC, IRON, RETICCTPCT in the last 72 hours. Urine analysis:    Component Value Date/Time   COLORURINE YELLOW 05/02/2019 1538   APPEARANCEUR CLEAR 04/19/2019 1538   LABSPEC 1.008 04/14/2019 1538   PHURINE 6.0 04/21/2019 1538   GLUCOSEU NEGATIVE 04/23/2019 1538   GLUCOSEU NEGATIVE 06/25/2014 1201   HGBUR SMALL (A) 04/14/2019 1538   BILIRUBINUR NEGATIVE 04/28/2019 1538   KETONESUR NEGATIVE 04/20/2019 1538   PROTEINUR NEGATIVE 04/22/2019 1538   UROBILINOGEN 0.2 08/27/2014 1954   NITRITE NEGATIVE 04/26/2019 1538   LEUKOCYTESUR NEGATIVE 04/16/2019 1538    Sepsis Labs:  None.  Personally Reviewed EKG:  Atrial fibrillation without RVR.  No acute ischemic finding.  Assessment/Plan Hypercalcemia: Ca 13.6 (adjustes to 14.6 for albumin).  Ionized calcium 1.85.  Likely due to dehydration.  Normal baseline about 5 months ago. -Normal saline bolus ordered in ED. -change maintenance to NS-KCl at 100 cc/hr  -IV bisphosphonate if no improvement with IV fluid. -Check PTH  AKI: Likely prerenal although BUN within normal.  Could be due to diuretics. -IV fluid as above -Hold home torsemide -Continue monitoring  Hypokalemia: K3.3> 3.2 -IV KCl 40 mEq -Check magnesium  Hypovolemic hyponatremia -IV fluid as above.  Acute metabolic encephalopathy: Oriented x4 at baseline.  Only oriented to self and family now.  No apparent focal neuro deficit.  Likely due to the  above. -Treat treatable causes -Check ammonia, TSH and a.m. cortisol -Consider MRI brain if no improvement with the above interventions. -Frequent reorientation and delirium precautions.  Generalized weakness/deconditioning/debility -Treat treatable causes as above. -PT/OT eval  Persistent A. fib: Rate controlled on Cardizem. -Continue home Cardizem and warfarin  History of aortic valve replacement: On warfarin for anticoagulation. -Check PT/INR -Warfarin per pharmacy.  Chronic diastolic CHF/moderate pulmonary hypertension: Appears dry on exam. -Hold home torsemide -Monitor fluid status carefully while on IV fluid -Monitor renal function.  Leukocytosis/bandemia: Suspect hemoconcentration versus infection.  Afebrile.  No changes to bladder habits. -Continue monitoring  Chronic back pain -Scheduled Tylenol -Voltaren gel -As needed fentanyl  Goal of care: DNR/DNI-family considering comfort measures if no improvement  DVT prophylaxis: On warfarin for mechanical valve and A. fib  Code Status: DNR/DNI Family Communication: Updated patient's daughter at bedside.  Disposition Plan: Admit to medical telemetry-inpatient status. Consults called: None Admission status: Inpatient   Mercy Riding MD [facility]  If 7PM-7AM, please contact night-coverage [email or web address] Password [facility]  04/26/2019, 5:49 PM

## 2019-04-25 NOTE — ED Notes (Signed)
Patient's daughter at bedside.

## 2019-04-25 NOTE — ED Notes (Signed)
Patient placed on pure wick °

## 2019-04-25 NOTE — ED Notes (Signed)
Hospitalist at bedside 

## 2019-04-25 NOTE — ED Notes (Signed)
Transport called to take patient upstairs 

## 2019-04-25 NOTE — ED Notes (Signed)
Date and time results received: 04/23/2019 5:09 PM  (use smartphrase ".now" to insert current time)  Test: Ca+ Critical Value: 13.6  Name of Provider Notified: Vanita Panda  Orders Received? Or Actions Taken?: See Chart

## 2019-04-25 NOTE — ED Notes (Signed)
Update provided to patient's daughter [name].

## 2019-04-25 NOTE — ED Notes (Signed)
Daughter, [name] would like to come back and be with mother and talk for her, [phone].

## 2019-04-25 NOTE — ED Triage Notes (Signed)
Per EMS, patient from [facility], staff reports failure to thrive x3 weeks. Negative Covid test at facility. Seen on 12/31 for fall and staff reports deterioration with decreased appetite since that time. Baseline A&Ox4, A&Ox2 at this time.   Bradycardic with EMS CBG 168 BP 115/58

## 2019-04-25 NOTE — ED Provider Notes (Signed)
[place] [facility] DEPT Provider Note   CSN: [id number] Arrival date & time: 05/03/2019  1450     History Chief Complaint  Patient presents with  . Altered Mental Status  . Failure To [place] is a 86 y.o. female.  HPI    Patient presents from nursing facility with concern of failure to thrive, anorexia, weakness. The patient herself is essentially nonverbal, has a history of memory loss, cannot provide any details of today's presentation. Level 5 caveat. Per report the patient was seen and evaluated about 3 weeks ago, and since that time has had decreased functionality, interactivity, appetite. Report of contact with sick family members about 3 weeks ago, otherwise no obvious sick contacts. No report of any fever, vomiting, diarrhea, fall.   Past Medical History:  Diagnosis Date  . Abnormal liver diagnostic imaging    suspected cirrhosis  . Arthritis   . Atrial fibrillation ([place])   . Blind    legally  . Chronic back pain   . COPD (chronic obstructive pulmonary disease) ([place])   . Diverticulosis   . Dyslipidemia    takes Lipitor daily  . Dysrhythmia    Atrial Fibrillation  . Foot drop    SINCE 1987  . GERD (gastroesophageal reflux disease)   . H/O hiatal hernia   . Hearing impaired    Bilateral hearing aids  . History of bacterial endocarditis   . History of gastritis   . History of stomach ulcers    many yrs ago  . History of TIA (transient ischemic attack)    2013-- RESIDUAL PERIPHERAL VISION RIGHT EYE--  RESOLVED  . Hypertension   . IC (interstitial cystitis)   . Macular degeneration   . Myocardial infarction ([place])   . Pulmonary hypertension ([place]) 10/2013   Based on TTE  . S/P aortic valve replacement    2005  . Stroke [facility])    TIA, no preherial vison   . Urine incontinence   . Wears dentures   . Wears glasses     Patient Active Problem List   Diagnosis Date Noted  . CAD (coronary artery disease) 07/13/2018   . Hyperlipidemia 07/13/2018  . Open wound of foot 07/05/2018  . Acute on chronic congestive heart failure ([place])   . Weakness generalized   . Palliative care by specialist   . DNR (do not resuscitate)   . Dyspnea 06/23/2018  . Other cirrhosis of liver ([place]) 06/23/2018  . Left foot drop 06/23/2018  . Acute on chronic respiratory failure with hypoxia ([place]) 06/23/2018  . COPD with chronic bronchitis ([place]) 06/23/2018  . Acute hypoxemic respiratory failure ([place]) 06/23/2018  . Heart failure ([place]) 06/23/2018  . Streptococcal bacteremia   . Sepsis secondary to UTI ([place]) 06/14/2018  . Acute metabolic encephalopathy   . Acute lower UTI   . Confusion 05/04/2018  . Dementia ([place]) 05/04/2018  . Chronic ulcer of left foot ([place]) 05/04/2018  . CKD (chronic kidney disease), stage III 01/30/2018  . H/O hiatal hernia   . Allergic rhinitis 06/06/2017  . Mediastinal lymphadenopathy 06/06/2017  . Encounter for therapeutic drug monitoring 10/04/2016  . Colitis 09/29/2016  . Abdominal pain 09/29/2016  . Diarrhea 09/29/2016  . Chronic back pain 09/29/2016  . Dyspepsia 04/14/2016  . Cough 12/02/2014  . Emphysema lung ([place]) 12/02/2014  . Oral thrush 12/02/2014  . Multiple lung nodules on CT 10/07/2014  . Nausea without vomiting 09/30/2014  . Hyponatremia 08/27/2014  . Nausea &  vomiting 08/27/2014  . Anemia 02/06/2014  . Perioral numbness 10/19/2013  . Headache(784.0) 10/12/2013  . Chronic diastolic heart failure ([place]) 10/12/2013  . Atelectasis 10/12/2013  . Pulmonary hypertension ([place]) 10/12/2013  . Cephalalgia   . Dizziness 10/11/2013  . Headache 10/11/2013  . S/P AVR (aortic valve replacement) 11/19/2012  . Abdominal pain, periumbilic [id number]  . 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 Calculus of bile duct without mention of cholecystitis or obstruction 08/10/2012  . Chronic gastritis 07/16/2012  . Nausea alone 05/21/2012  . Abdominal pain, epigastric 05/21/2012  . Cellulitis of foot  01/01/2012  . Dysphagia, unspecified(787.20) 12/26/2011  . Esophageal reflux 12/26/2011  . Dyspnea on exertion 11/18/2011  . Chronic atrial fibrillation ([place]) 11/07/2011  . Chest pain 06/06/2011  . Aortic stenosis 03/24/2011  . Hypertension 03/24/2011  . H/O aortic valve replacement 10/02/2003    Past Surgical History:  Procedure Laterality Date  . ABDOMINAL HYSTERECTOMY  1967  . AORTIC VALVE REPLACEMENT  09-29-2003    [facility] pericardial tissue valve  . APPENDECTOMY  1933  . APPLICATION OF A-CELL OF EXTREMITY Left 09/06/2018   Procedure: DEBRIDMENT LEFT FOOT WITH ACELL PLACEMENT;  Surgeon: Wallace Going, DO;  Location: [place];  Service: Plastics;  Laterality: Left;  . APPLICATION OF A-CELL OF EXTREMITY Left 10/31/2018   Procedure: APPLICATION OF A-CELL OF EXTREMITY;  Surgeon: Wallace Going, DO;  Location: [place];  Service: Plastics;  Laterality: Left;  . BIOPSY  02/01/2018   Procedure: BIOPSY;  Surgeon: Rush Landmark Telford Nab., MD;  Location: [place];  Service: Gastroenterology;;  . CARDIAC CATHETERIZATION  07-15-2003 DR HELEN PRESTON   MODERATE TO MODERATELY SEVERE CALCIFIC AORTIC STENOSIS/  NORMAL CORONARY ARTERIES  . CARDIOVASCULAR STRESS TEST  01-30-2012  DR NASHER   NORMAL NUCLEAR STUDY/  EF 73%/  NORMAL LVF  . CARPAL TUNNEL RELEASE Bilateral   . CATARACT EXTRACTION W/ INTRAOCULAR LENS  IMPLANT, BILATERAL    . CHOLECYSTECTOMY  1993  . CYSTO WITH HYDRODISTENSION N/A 02/05/2013   Procedure: CYSTOSCOPY/HYDRODISTENSION;  Surgeon: Irine Seal, MD;  Location: [facility];  Service: Urology;  Laterality: N/A;  . CYSTO/ HYDRODISTENTION/ INSTILLATION CLORPACTIN  MULTIPLE  last one 2009  . DILATION AND CURETTAGE OF UTERUS    . ERCP N/A 08/10/2012   Procedure: ENDOSCOPIC RETROGRADE CHOLANGIOPANCREATOGRAPHY (ERCP);  Surgeon: Inda Castle, MD;  Location: [place];  Service: Gastroenterology;  Laterality: N/A;  . ESOPHAGOGASTRODUODENOSCOPY (EGD)  WITH PROPOFOL N/A 08/18/2014   Procedure: ESOPHAGOGASTRODUODENOSCOPY (EGD) WITH PROPOFOL;  Surgeon: Inda Castle, MD;  Location: [facility] ENDOS[facility];  Service: Endoscopy;  Laterality: N/A;  . ESOPHAGOGASTRODUODENOSCOPY (EGD) WITH PROPOFOL N/A 02/01/2018   Procedure: ESOPHAGOGASTRODUODENOSCOPY (EGD) WITH PROPOFOL;  Surgeon: Rush Landmark Telford Nab., MD;  Location: [place];  Service: Gastroenterology;  Laterality: N/A;  . FOREIGN BODY REMOVAL  02/01/2018   Procedure: FOREIGN BODY REMOVAL;  Surgeon: Rush Landmark Telford Nab., MD;  Location: [place];  Service: Gastroenterology;;  . INCISION AND DRAINAGE OF WOUND Left 10/31/2018   Procedure: DEBRIDEMENT LEFT FOOT WOUND;  Surgeon: Wallace Going, DO;  Location: [place];  Service: Plastics;  Laterality: Left;  . LEFT HEART CATHETERIZATION WITH CORONARY ANGIOGRAM N/A 10/18/2013   Procedure: LEFT HEART CATHETERIZATION WITH CORONARY ANGIOGRAM;  Surgeon: Jettie Booze, MD;  Location: [facility] CATH LAB;  Service: Cardiovascular;  Laterality: N/A;  . LUMBAR [place] &  2007  . MACROPLASTIQUE URETHRAL IMPLANTATION  08-27-2009  . MINI-OPEN RIGHT ROTATOR CUFF REPAIR  07-12-2001  . RIGHT  SHOULDER ARTHROSCOPY W/ DEBRIDEMENT ROTATOR CUFF AND LABRAL TEAR/ ACROMINOPLASTY/ DISTAL CLAVICLE EXCISION/ CA LIGAMENT RELEASE  10-08-1999  . TEE WITHOUT CARDIOVERSION N/A 06/20/2018   Procedure: TRANSESOPHAGEAL ECHOCARDIOGRAM (TEE);  Surgeon: Jerline Pain, MD;  Location: [facility] ENDOSCOPY;  Service: Cardiovascular;  Laterality: N/A;  . TONSILLECTOMY AND ADENOIDECTOMY    . TRANSTHORACIC ECHOCARDIOGRAM  08-10-2011   MILD LVH/  EF 55-60%/  NORMAL AVR TISSUE/ MILD MR/  MODERATE DILATED LA/  MILD DILATED RA  . TRANSVAGINAL TAPE PROCEDURE  07-30-2002  . VEIN LIGATION  1969   RIGHT LOWER LEG     OB History   No obstetric history on file.     Family History  Problem Relation Age of Onset  . Heart disease Father   . Stomach cancer Maternal Grandmother   .  Esophageal cancer Maternal Grandfather   . Bipolar disorder Son        Committed Suicide  . Coronary artery disease Brother   . Bipolar disorder Brother        commited suiside at 89yo  . Alcohol abuse Sister        sister #1  . Alcohol abuse Sister        sister #2  . Colon cancer Maternal Aunt   . Lung disease Neg Hx     Social History   Tobacco Use  . Smoking status: Former Smoker    Packs/day: 0.50    Years: 40.00    Pack years: 20.00    Types: Cigarettes    Start date: 05/06/1949    Quit date: 03/06/1990    Years since quitting: 29.1  . Smokeless tobacco: Never Used  Substance Use Topics  . Alcohol use: No    Alcohol/week: 0.0 standard drinks    Comment: Remote social EtOH  . Drug use: No    Home Medications Prior to Admission medications   Medication Sig Start Date End Date Taking? Authorizing Provider  acetaminophen (TYLENOL) 500 MG tablet Take 500 mg by mouth every 6 (six) hours as needed (for pain/fever).   Yes [provider]  albuterol (VENTOLIN HFA) 108 (90 Base) MCG/ACT inhaler Inhale 1 puff into the lungs every 4 (four) hours as needed for wheezing or shortness of breath.   Yes [provider]  atorvastatin (LIPITOR) 20 MG tablet Take 20 mg by mouth daily.   Yes [provider]  busPIRone (BUSPAR) 15 MG tablet Take 1 tablet (15 mg total) by mouth 2 (two) times daily. PLEASE SCHEDULE AN OFFICE VISIT FOR FURTHER REFILLS: [phone] 12/18/18  Yes Armbruster, Carlota Raspberry, MD  carboxymethylcellulose 1 % ophthalmic solution Place 1 drop into both eyes at bedtime.   Yes [provider]  cycloSPORINE (RESTASIS) 0.05 % ophthalmic emulsion Place 1 drop into both eyes 2 (two) times daily.   Yes [provider]  diazepam (VALIUM) 5 MG tablet Take 5 mg by mouth at bedtime as needed for anxiety.   Yes [provider]  diltiazem (CARDIZEM) 120 MG tablet Take 120 mg by mouth 2 (two) times daily.   Yes [provider]   Eyelid Cleansers (OCUSOFT LID SCRUB EX) Apply 1 application topically 2 (two) times a day.   Yes [provider]  HYDROcodone-acetaminophen (NORCO/VICODIN) 5-325 MG tablet Take 1 tablet by mouth every 6 (six) hours as needed for severe pain. 02/02/18  Yes Aline August, MD  Multiple Vitamins-Minerals (PRESERVISION AREDS 2 PO) Take 1 tablet by mouth daily.   Yes [provider]  nitroGLYCERIN (  NITROSTAT) 0.4 MG SL tablet Place 1 tablet (0.4 mg total) under the tongue every 5 (five) minutes as needed for chest pain. 02/02/18  Yes Aline August, MD  ondansetron (ZOFRAN) 4 MG tablet Take 4 mg by mouth every 4 (four) hours as needed for nausea or vomiting.   Yes [provider]  pantoprazole (PROTONIX) 40 MG tablet Take 40 mg by mouth 2 (two) times daily. 06/18/18  Yes [provider]  polyethylene glycol (MIRALAX / GLYCOLAX) 17 g packet Take 17 g by mouth daily as needed for mild constipation.   Yes [provider]  Polyvinyl Alcohol-Povidone (REFRESH OP) Place 1 drop into both eyes 2 (two) times daily.   Yes [provider]  potassium chloride (K-DUR) 10 MEQ tablet Take 10 mEq by mouth daily.    Yes [provider]  torsemide (DEMADEX) 10 MG tablet Take 10 mg by mouth daily.    Yes [provider]  trolamine salicylate (ASPERCREME) 10 % cream Apply 1 application topically 2 (two) times daily as needed for muscle pain (right hip).   Yes [provider]  warfarin (COUMADIN) 5 MG tablet Take 1/2- 1 tablet daily Patient taking differently: Take 2.5-5 mg by mouth See admin instructions. 2.5mg  on Tues,Wed,Thurs,Sat & Sun 5mg  on Friday, Monday 04/11/19  Yes Nahser, Wonda Cheng, MD    Allergies    Amlodipine, Amoxicillin, Carafate [sucralfate], Cephalexin, Ciprofloxacin, Codeine, Irbesartan, Morphine and related, Neurontin [gabapentin], Nitrofurantoin monohyd macro, Penicillins, Prednisone, and Sulfa antibiotics  Review of Systems    Review of Systems  Unable to perform ROS: Dementia    Physical Exam Updated Vital Signs BP (!) 128/58   Pulse (!) 55   Temp (!) 97.5 F (36.4 C) (Oral)   Resp 20   SpO2 96%   Physical Exam Vitals and nursing note reviewed.  Constitutional:      Comments: Sickly appearing withdrawn thin elderly female minimally interactive  HENT:     Head: Normocephalic and atraumatic.  Eyes:     Conjunctiva/sclera: Conjunctivae normal.  Cardiovascular:     Rate and Rhythm: Normal rate and regular rhythm.  Pulmonary:     Effort: Pulmonary effort is normal. No respiratory distress.     Breath sounds: Normal breath sounds. No stridor.  Abdominal:     General: There is no distension.  Skin:    General: Skin is warm and dry.  Neurological:     Mental Status: She is alert.     Cranial Nerves: No cranial nerve deficit.     Motor: Atrophy present.     Comments: Does not follow commands reliably, not participatory in exam.  No gross facial asymmetry.  There is diffuse atrophy.  Psychiatric:        Cognition and Memory: Cognition is impaired.     ED Results / Procedures / Treatments   Labs (all labs ordered are listed, but only abnormal results are displayed) Labs Reviewed  COMPREHENSIVE METABOLIC PANEL - Abnormal; Notable for the following components:      Result Value   Sodium 129 (*)    Potassium 3.3 (*)    Chloride 89 (*)    Glucose, Bld 133 (*)    Creatinine, Ser 1.40 (*)    Calcium 13.6 (*)    Total Protein 8.6 (*)    Albumin 2.7 (*)    GFR calc non Af Amer 33 (*)    GFR calc Af Amer 39 (*)    All other components within normal limits  CBC WITH DIFFERENTIAL/PLATELET - Abnormal; Notable for the following components:   WBC 12.2 (*)    Neutro Abs 7.9 (*)    Monocytes Absolute 1.9 (*)    All other components within normal limits  URINALYSIS, ROUTINE W REFLEX MICROSCOPIC - Abnormal; Notable for the following components:   Hgb urine dipstick SMALL (*)    Bacteria, UA MANY (*)     All other components within normal limits  RESPIRATORY PANEL BY RT PCR (FLU A&B, COVID)  URINE CULTURE  ETHANOL  LIPASE, BLOOD  I-STAT CHEM 8, ED    EKG EKG Interpretation  Date/Time:  Thursday April 25 2019 15:12:56 EST Ventricular Rate:  59 PR Interval:    QRS Duration: 102 QT Interval:  426 QTC Calculation: 422 R Axis:   -80 Text Interpretation: Atrial fibrillation Consider right ventricular hypertrophy Inferior infarct, old Probable anteroseptal infarct, old Abnormal ECG Confirmed by Carmin Muskrat [phone]) on 04/17/2019 3:42:27 PM   Radiology DG Chest Port 1 View  Result Date: 04/21/2019 CLINICAL DATA:  ALOC short of breath EXAM: PORTABLE CHEST 1 VIEW COMPARISON:  06/24/2018 FINDINGS: Post sternotomy changes and valve prosthesis. Trace left pleural effusion. Enlarged cardiomediastinal silhouette without overt failure. Aortic atherosclerosis. No pneumothorax. IMPRESSION: 1. Suspect trace left effusion. Small foci of atelectasis at the bases. 2. Mild cardiomegaly Electronically Signed   By: Donavan Foil M.D.   On: 05/09/2019 15:50    Procedures Procedures (including critical care time)  Medications Ordered in ED Medications  0.9 %  sodium chloride infusion ( Intravenous New Bag/Given (Non-Interop) 05/07/2019 1542)  sodium chloride 0.9 % bolus 1,000 mL (has no administration in time range)    ED Course  I have reviewed the triage vital signs and the nursing notes.  Pertinent labs & imaging results that were available during my care of the patient were reviewed by me and considered in my medical decision making (see chart for details).    MDM Rules/Calculators/A&P                      5:31 PM Patient in similar condition, though she does now offer some verbal interaction, though this is inconsistent.  She describes back pain.  She is now accompanied by her daughter who is at bedside. Daughter notes the patient has long history of back pain typically uses pain  medication, which will be provided. Discussed the patient's presentation at length. Daughter notes that the patient had a notable change in status about 4 days ago, and since that point has been withdrawn, less interactive, now requesting to see her deceased husband, and children. We discussed goals of care, patient's prognosis at length as well. Patient is confirmed to be DNR, with emphasis on medical interventions, the will not cause discomfort, may provide a chance for recovery. Patient is found to have critically abnormal calcium value, greater than 13,000, acute kidney injury, hyponatremia requiring fluid resuscitation, admission. She is Covid negative.  Final Clinical Impression(s) / ED Diagnoses Final diagnoses:  Delirium  Hypercalcemia  AKI (acute kidney injury) ([place])     Carmin Muskrat, MD 04/27/2019 1733

## 2019-04-26 ENCOUNTER — Other Ambulatory Visit: Payer: Self-pay

## 2019-04-26 DIAGNOSIS — N179 Acute kidney failure, unspecified: Principal | ICD-10-CM

## 2019-04-26 DIAGNOSIS — R410 Disorientation, unspecified: Secondary | ICD-10-CM

## 2019-04-26 LAB — BASIC METABOLIC PANEL
Anion gap: 9 (ref 5–15)
BUN: 18 mg/dL (ref 8–23)
CO2: 29 mmol/L (ref 22–32)
Calcium: 13.9 mg/dL (ref 8.9–10.3)
Chloride: 97 mmol/L — ABNORMAL LOW (ref 98–111)
Creatinine, Ser: 1.2 mg/dL — ABNORMAL HIGH (ref 0.44–1.00)
GFR calc Af Amer: 46 mL/min — ABNORMAL LOW (ref >60)
GFR calc non Af Amer: 40 mL/min — ABNORMAL LOW (ref >60)
Glucose, Bld: 103 mg/dL — ABNORMAL HIGH (ref 70–99)
Potassium: 3.6 mmol/L (ref 3.5–5.1)
Sodium: 135 mmol/L (ref 135–145)

## 2019-04-26 LAB — URINE CULTURE: Culture: <10000 — AB

## 2019-04-26 LAB — CBC
HCT: 42.3 % (ref 36.0–46.0)
Hemoglobin: 13.7 g/dL (ref 12.0–15.0)
MCH: 28.3 pg (ref 26.0–34.0)
MCHC: 32.4 g/dL (ref 30.0–36.0)
MCV: 87.4 fL (ref 80.0–100.0)
Platelets: 255 10*3/uL (ref 150–400)
RBC: 4.84 MIL/uL (ref 3.87–5.11)
RDW: 14.5 % (ref 11.5–15.5)
WBC: 10.6 10*3/uL — ABNORMAL HIGH (ref 4.0–10.5)
nRBC: 0 % (ref 0.0–0.2)

## 2019-04-26 LAB — CORTISOL-AM, BLOOD: Cortisol - AM: 16.1 ug/dL (ref 6.7–22.6)

## 2019-04-26 LAB — APTT: aPTT: 39 seconds — ABNORMAL HIGH (ref 24–36)

## 2019-04-26 LAB — MAGNESIUM: Magnesium: 2 mg/dL (ref 1.7–2.4)

## 2019-04-26 LAB — HEMOGLOBIN A1C
Hgb A1c MFr Bld: 5.6 % (ref 4.8–5.6)
Mean Plasma Glucose: 114.02 mg/dL

## 2019-04-26 LAB — PROTIME-INR
INR: 3.9 — ABNORMAL HIGH (ref 0.8–1.2)
Prothrombin Time: 38.6 seconds — ABNORMAL HIGH (ref 11.4–15.2)

## 2019-04-26 LAB — PHOSPHORUS: Phosphorus: 3.2 mg/dL (ref 2.5–4.6)

## 2019-04-26 LAB — CK: Total CK: 28 U/L — ABNORMAL LOW (ref 38–234)

## 2019-04-26 MED ORDER — ZOLEDRONIC ACID 4 MG/5ML IV CONC: 4.0000 mg | Freq: Once | INTRAVENOUS | Status: DC

## 2019-04-26 MED ORDER — POTASSIUM CHLORIDE IN NACL 20-0.9 MEQ/L-% IV SOLN
INTRAVENOUS | Status: DC
  Filled 2019-04-26: qty 1000

## 2019-04-26 MED ORDER — ENSURE ENLIVE PO LIQD
237.0000 mL | Freq: Two times a day (BID) | ORAL | Status: DC
  Administered 2019-04-27: 237 mL via ORAL

## 2019-04-26 MED ORDER — DEXAMETHASONE SODIUM PHOSPHATE 10 MG/ML IJ SOLN
4.0000 mg | Freq: Two times a day (BID) | INTRAMUSCULAR | Status: AC
  Administered 2019-04-26 (×2): 4 mg via INTRAVENOUS
  Filled 2019-04-26 (×2): qty 1

## 2019-04-26 MED ORDER — ADULT MULTIVITAMIN W/MINERALS CH
1.0000 | ORAL_TABLET | Freq: Every day | ORAL | Status: DC
  Administered 2019-04-27: 1 via ORAL
  Filled 2019-04-26: qty 1

## 2019-04-26 MED ORDER — SODIUM CHLORIDE 0.9 % IV SOLN: INTRAVENOUS | Status: DC

## 2019-04-26 NOTE — Evaluation (Signed)
Physical Therapy Evaluation Patient Details Name: Brittany [name] MRN: [id number] DOB: [birth year] Today's Date: 04/26/2019   History of Present Illness  86 y.o. female with history of A. fib/AVR on warfarin, diastolic CHF, pulm HTN, COPD, chronic back pain and recent fall presenting with altered mental status, poor p.o. intake and generalized weakness from ALF  Clinical Impression  Pt admitted with above diagnosis.  Pt currently with functional limitations due to the deficits listed below (see PT Problem List). Pt will benefit from skilled PT to increase their independence and safety with mobility to allow discharge to the venue listed below.  Pt difficult to awaken however reports being tired and wanting to sleep.  Pt agreeable to sit EOB and take medications from RN however requiring total assist for bed mobility.  Pt from ALF however uncertain if ALF can provide pt's current level of care, so if ALF unable, pt may need SNF.     Follow Up Recommendations SNF    Equipment Recommendations  None recommended by PT    Recommendations for Other Services       Precautions / Restrictions Precautions Precautions: Fall Precaution Comments: very HOH      Mobility  Bed Mobility Overal bed mobility: Needs Assistance Bed Mobility: Supine to Sit;Sit to Supine     Supine to sit: Total assist;+2 for physical assistance Sit to supine: +2 for physical assistance;Total assist   General bed mobility comments: pt initiating bringing LEs over EOB however increased time and effort, pt required total assist to complete bed mobility, only agreeable to sit EOB to take meds from RN (wishes to sleep today)  Transfers                    Ambulation/Gait                Stairs            Wheelchair Mobility    Modified Rankin (Stroke Patients Only)       Balance Overall balance assessment: Needs assistance Sitting-balance support: Bilateral upper extremity supported;Feet  supported Sitting balance-Leahy Scale: Zero Sitting balance - Comments: requiring UE support and external assist                                     Pertinent Vitals/Pain Pain Assessment: No/denies pain    Home Living Family/patient expects to be discharged to:: Assisted living               Home Equipment: Walker - 2 wheels      Prior Function Level of Independence: Needs assistance   Gait / Transfers Assistance Needed: ambulates with RW, receiving HHPT prior to this admission           Hand Dominance        Extremity/Trunk Assessment   Upper Extremity Assessment Upper Extremity Assessment: Generalized weakness    Lower Extremity Assessment Lower Extremity Assessment: Generalized weakness       Communication   Communication: HOH  Cognition Arousal/Alertness: Awake/alert Behavior During Therapy: Flat affect Overall Cognitive Status: No family/caregiver present to determine baseline cognitive functioning                                 General Comments: no hx of dementia, pt very HOH, pt did vocalize her wishes during session (previous notes state pt  mostly nonverbal)      General Comments      Exercises     Assessment/Plan    PT Assessment Patient needs continued PT services  PT Problem List Decreased strength;Decreased activity tolerance;Decreased knowledge of use of DME;Decreased balance;Decreased mobility       PT Treatment Interventions DME instruction;Therapeutic activities;Gait training;Therapeutic exercise;Patient/family education;Functional mobility training;Balance training;Wheelchair mobility training    PT Goals (Current goals can be found in the Care Plan section)  Acute Rehab PT Goals PT Goal Formulation: With patient Time For Goal Achievement: 05/10/19 Potential to Achieve Goals: Fair    Frequency Min 2X/week   Barriers to discharge        Co-evaluation               AM-PAC PT "6  Clicks" Mobility  Outcome Measure Help needed turning from your back to your side while in a flat bed without using bedrails?: Total Help needed moving from lying on your back to sitting on the side of a flat bed without using bedrails?: Total Help needed moving to and from a bed to a chair (including a wheelchair)?: Total Help needed standing up from a chair using your arms (e.g., wheelchair or bedside chair)?: Total Help needed to walk in hospital room?: Total Help needed climbing 3-5 steps with a railing? : Total 6 Click Score: 6    End of Session   Activity Tolerance: Patient limited by fatigue Patient left: in bed;with call bell/phone within reach;with bed alarm set;with nursing/sitter in room Nurse Communication: Mobility status PT Visit Diagnosis: Other abnormalities of gait and mobility (R26.89)    Time: [id number] PT Time Calculation (min) (ACUTE ONLY): 11 min   Charges:   PT Evaluation $PT Eval Low Complexity: 1 Low         Kati PT, DPT Acute Rehabilitation Services Office: [phone]  York Ram E 04/26/2019, 4:00 PM

## 2019-04-26 NOTE — TOC Initial Note (Addendum)
Transition of Care [facility]) - Initial/Assessment Note    Patient Details  Name: Brittany [name] MRN: [id number] Date of Birth: 1927-05-29  Transition of Care [facility]) CM/SW Contact:    Trish Mage, LCSW Phone Number: 04/26/2019, 9:29 AM  Clinical Narrative:    Resident of [facility] is here for failure to thrive since fall several weeks ago.  Currently oriented x1, basically non-verbal, DNR, daughter is involved.  See contact information below.  TOC will continue to follow during the course of hospitalization.                Expected Discharge Plan: Assisted Living Barriers to Discharge: No Barriers Identified   Patient Goals and CMS Choice        Expected Discharge Plan and Services Expected Discharge Plan: Assisted Living   Discharge Planning Services: CM Consult   Living arrangements for the past 2 months: [place]                                      Prior Living Arrangements/Services Living arrangements for the past 2 months: [place] Lives with:: Facility Resident Patient language and need for interpreter reviewed:: Yes        Need for Family Participation in Patient Care: Yes (Comment) Care giver support system in place?: Yes (comment)   Criminal Activity/Legal Involvement Pertinent to Current Situation/Hospitalization: No - Comment as needed  Activities of Daily Living Home Assistive Devices/Equipment: Walker (specify type) ADL Screening (condition at time of admission) Patient's cognitive ability adequate to safely complete daily activities?: No Is the patient deaf or have difficulty hearing?: No Does the patient have difficulty seeing, even when wearing glasses/contacts?: No Does the patient have difficulty concentrating, remembering, or making decisions?: Yes Patient able to express need for assistance with ADLs?: Yes Does the patient have difficulty dressing or bathing?: Yes Independently performs ADLs?:  No Communication: Needs assistance Is this a change from baseline?: Pre-admission baseline Dressing (OT): Needs assistance Is this a change from baseline?: Pre-admission baseline Grooming: Needs assistance Is this a change from baseline?: Pre-admission baseline Feeding: Needs assistance Is this a change from baseline?: Pre-admission baseline Bathing: Needs assistance Toileting: Needs assistance Is this a change from baseline?: Pre-admission baseline In/Out Bed: Needs assistance Is this a change from baseline?: Pre-admission baseline Does the patient have difficulty walking or climbing stairs?: Yes Weakness of Legs: Both Weakness of Arms/Hands: Both  Permission Sought/Granted Permission sought to share information with : Family Supports Permission granted to share information with : Yes, Verbal Permission Granted  Share Information with NAME: [name]     Permission granted to share info w Relationship: daughter  Permission granted to share info w Contact Information: [phone]  Emotional Assessment Appearance:: Appears stated age Attitude/Demeanor/Rapport: Lethargic Affect (typically observed): Unable to Assess Orientation: : Oriented to Self Alcohol / Substance Use: Not Applicable Psych Involvement: No (comment)  Admission diagnosis:  Hypercalcemia [E83.52] Delirium [R41.0] AKI (acute kidney injury) ([place]) [N17.9] Patient Active Problem List   Diagnosis Date Noted  . Hypercalcemia 05/09/2019  . CAD (coronary artery disease) 07/13/2018  . Hyperlipidemia 07/13/2018  . Open wound of foot 07/05/2018  . Acute on chronic congestive heart failure ([place])   . Weakness generalized   . Palliative care by specialist   . DNR (do not resuscitate)   . Dyspnea 06/23/2018  . Other cirrhosis of liver ([place])  06/23/2018  . Left foot drop 06/23/2018  . Acute on chronic respiratory failure with hypoxia ([place]) 06/23/2018  . COPD with chronic bronchitis ([place]) 06/23/2018  . Acute  hypoxemic respiratory failure ([place]) 06/23/2018  . Heart failure ([place]) 06/23/2018  . Streptococcal bacteremia   . Sepsis secondary to UTI ([place]) 06/14/2018  . Acute metabolic encephalopathy   . Acute lower UTI   . Confusion 05/04/2018  . Dementia ([place]) 05/04/2018  . Chronic ulcer of left foot ([place]) 05/04/2018  . CKD (chronic kidney disease), stage III 01/30/2018  . H/O hiatal hernia   . Allergic rhinitis 06/06/2017  . Mediastinal lymphadenopathy 06/06/2017  . Encounter for therapeutic drug monitoring 10/04/2016  . Colitis 09/29/2016  . Abdominal pain 09/29/2016  . Diarrhea 09/29/2016  . Chronic back pain 09/29/2016  . Dyspepsia 04/14/2016  . Cough 12/02/2014  . Emphysema lung ([place]) 12/02/2014  . Oral thrush 12/02/2014  . Multiple lung nodules on CT 10/07/2014  . Nausea without vomiting 09/30/2014  . Hyponatremia 08/27/2014  . Nausea & vomiting 08/27/2014  . Anemia 02/06/2014  . Perioral numbness 10/19/2013  . Headache(784.0) 10/12/2013  . Chronic diastolic heart failure ([place]) 10/12/2013  . Atelectasis 10/12/2013  . Pulmonary hypertension ([place]) 10/12/2013  . Cephalalgia   . Dizziness 10/11/2013  . Headache 10/11/2013  . S/P AVR (aortic valve replacement) 11/19/2012  . Abdominal pain, periumbilic [id number]  . 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 Calculus of bile duct without mention of cholecystitis or obstruction 08/10/2012  . Chronic gastritis 07/16/2012  . Nausea alone 05/21/2012  . Abdominal pain, epigastric 05/21/2012  . Cellulitis of foot 01/01/2012  . Dysphagia, unspecified(787.20) 12/26/2011  . Esophageal reflux 12/26/2011  . Dyspnea on exertion 11/18/2011  . Chronic atrial fibrillation ([place]) 11/07/2011  . Chest pain 06/06/2011  . Aortic stenosis 03/24/2011  . Hypertension 03/24/2011  . H/O aortic valve replacement 10/02/2003   PCP:  Mayra Neer, MD Pharmacy:   [facility],  - [address] [place]  [address] Phone: [phone] Fax: [place], [place] [facility] [address] [place] [address] Phone: [phone] Fax: [phone]     Social Determinants of Health ([place]) Interventions    Readmission Risk Interventions Readmission Risk Prevention Plan 06/26/2018 06/21/2018  Transportation Screening Complete Complete  Medication Review ([place]) Complete Complete  PCP or Specialist appointment within 3-5 days of discharge Complete Complete  HRI or [place] Complete Complete  SW Recovery Care/Counseling Consult Complete Complete  Palliative Care Screening Complete Not [place] Not Applicable Not Applicable  Some recent data might be hidden

## 2019-04-26 NOTE — Progress Notes (Signed)
OT Cancellation Note  Patient Details Name: Brittany [name] MRN: [id number] DOB: 11/28/1927   Cancelled Treatment:    Reason Eval/Treat Not Completed: Fatigue/lethargy limiting ability to participate. Per conversation with RN, pt has been very lethargic this morning. Just finished working with PT where she sat EOB briefly to take meds with RN then back to bed. Plan to reattempt at a later time/date.   Tyrone Schimke, OT Acute Rehabilitation Services Pager: [phone] Office: [phone]  04/26/2019, 10:21 AM

## 2019-04-26 NOTE — Progress Notes (Signed)
Initial Nutrition Assessment  DOCUMENTATION CODES:   Not applicable  INTERVENTION:  - will order Ensure Enlive BID, each supplement provides 350 kcal and 20 grams of protein. - will order Magic Cup BID with meals, each supplement provides 290 kcal and 9 grams of protein. - will order daily multivitamin with minerals. - recommend diet liberalization from 2 gram Na to Regular.    NUTRITION DIAGNOSIS:   Increased nutrient needs related to acute illness, wound healing as evidenced by estimated needs.  GOAL:   Patient will meet greater than or equal to 90% of their needs  MONITOR:   PO intake, Supplement acceptance, Labs, Weight trends  REASON FOR ASSESSMENT:   Malnutrition Screening Tool  ASSESSMENT:   86 y.o. female with history of A. fib/AVR on warfarin, CHF, pulmonary HTN, COPD, chronic back pain, and a recent fall. She presented to the ED with AMS, poor PO intake, and generalized weakness. She had a fall on 12/31 and was evaluated in the ED at that time and imaging did not indicate any acute issues. She was discharged home and Home Health was set up. Patient has continued to decline since that time. She is DNR/DNI and her daughter is open to transitioning to comfort care if she does not improve with IV fluids.  Flow sheet documentation indicates that patient is a/o to self only and she consumed 50% of breakfast this AM (372 kcal, 14 grams protein).   Per chart review, weight today is 135 lb and weight on 01/07/19 was 126 lb. This indicates 9 lb weight gain in the past 3.5 months.   Per notes: - hypercalcemia--due to decreased PO intake and dehydration - hypokalemia and hyponatremia--resolved with IV fluids - AKI - acute metabolic encephalopathy 2/2 persistent hypercalcemia - DNR/DNI with plan to continue IV fluid x24 hours and if no improvement, then consideration of transition to comfort care   Labs reviewed; Cl: 97 mmol/l, creatinine: 1.2 mg/dl, Ca: 13.9 mg/dl, GFR: 40  ml/min. Medications reviewed; 100 mg colace BID, 2 g IV Mg sulfate x1 run 1/14, 40 mg oral protonix BID, 40 mEq Klor-Con x1 dose 1/14.  IVF; NS @ 150 ml/hr.    NUTRITION - FOCUSED PHYSICAL EXAM:  did not complete at this time.   Diet Order:   Diet Order            Diet 2 gram sodium Room service appropriate? Yes; Fluid consistency: Thin  Diet effective now              EDUCATION NEEDS:   No education needs have been identified at this time  Skin:  Skin Assessment: Skin Integrity Issues: Skin Integrity Issues:: Unstageable Unstageable: full thickness to L foot  Last BM:  PTA/unknown  Height:   Ht Readings from Last 1 Encounters:  01/07/19 5\' 1"  (1.549 m)    Weight:   Wt Readings from Last 1 Encounters:  04/26/19 61.2 kg    Ideal Body Weight:  47.7 kg  BMI:  Body mass index is 25.49 kg/m.  Estimated Nutritional Needs:   Kcal:  1700-1900 kcal  Protein:  80-95 grams  Fluid:  >/= 1.8 L/day     Jarome Matin, MS, RD, LDN, [facility] In[facility] Clinical Dietitian Pager # [phone] After hours/weekend pager # [phone]

## 2019-04-26 NOTE — Progress Notes (Signed)
ANTICOAGULATION CONSULT NOTE - Initial Consult  Pharmacy Consult for coumadin Indication: AVR/Afib  Allergies  Allergen Reactions  . Amlodipine Swelling and Other (See Comments)     Unspecified swelling reaction  . Amoxicillin Diarrhea, Nausea And Vomiting and Other (See Comments)    Has patient had a PCN reaction causing immediate rash, facial/tongue/throat swelling, SOB or lightheadedness with hypotension: No Has patient had a PCN reaction causing severe rash involving mucus membranes or skin necrosis: No Has patient had a PCN reaction that required hospitalization: No Has patient had a PCN reaction occurring within the last 10 years: No If all of the above answers are "NO", then may proceed with Cephalosporin use.  . Carafate [Sucralfate] Swelling and Other (See Comments)    Reaction:  Knee swelling/redness   . Cephalexin Diarrhea  . Ciprofloxacin   . Codeine Nausea And Vomiting  . Irbesartan Swelling and Other (See Comments)    Reaction:  Facial/hand swelling and numbness   . Morphine And Related Other (See Comments)    Pt states that she has a history of addiction with this medication.      . Neurontin [Gabapentin] Swelling and Other (See Comments)    Reaction:  Leg swelling   . Nitrofurantoin Monohyd Macro Diarrhea and Nausea And Vomiting  . Penicillins   . Prednisone Other (See Comments)    Reaction:  Elevated BP  . Sulfa Antibiotics Rash    Patient Measurements: Weight: 134 lb 14.7 oz (61.2 kg)  Vital Signs: Temp: 97.2 F (36.2 C) (01/15 0752) Temp Source: Oral (01/15 0752) BP: 148/63 (01/15 0752) Pulse Rate: 66 (01/15 0752)  Labs: Recent Labs    04/15/2019 1538 04/22/2019 1538 04/12/2019 1839 04/15/2019 1938 04/26/19 0451  HGB 13.1   < > 14.3  --  13.7  HCT 40.7  --  42.0  --  42.3  PLT 236  --   --   --  255  APTT  --   --   --   --  39*  LABPROT  --   --   --  39.7* 38.6*  INR  --   --   --  4.1* 3.9*  CREATININE 1.40*  --  1.40*  --  1.20*  CKTOTAL  --    --   --   --  28*   < > = values in this interval not displayed.    Estimated Creatinine Clearance: 26.7 mL/min (A) (by C-G formula based on SCr of 1.2 mg/dL (H)).   Medical History: Past Medical History:  Diagnosis Date  . Abnormal liver diagnostic imaging    suspected cirrhosis  . Arthritis   . Atrial fibrillation ([place])   . Blind    legally  . Chronic back pain   . COPD (chronic obstructive pulmonary disease) ([place])   . Diverticulosis   . Dyslipidemia    takes Lipitor daily  . Dysrhythmia    Atrial Fibrillation  . Foot drop    SINCE 1987  . GERD (gastroesophageal reflux disease)   . H/O hiatal hernia   . Hearing impaired    Bilateral hearing aids  . History of bacterial endocarditis   . History of gastritis   . History of stomach ulcers    many yrs ago  . History of TIA (transient ischemic attack)    2013-- RESIDUAL PERIPHERAL VISION RIGHT EYE--  RESOLVED  . Hypertension   . IC (interstitial cystitis)   . Macular degeneration   . Myocardial infarction ([place])   .  Pulmonary hypertension ([place]) 10/2013   Based on TTE  . S/P aortic valve replacement    2005  . Stroke [facility])    TIA, no preherial vison   . Urine incontinence   . Wears dentures   . Wears glasses     Assessment: 86 yo F on coumadin PTA for Afib and AVR on 2005. Coumadin clinic pt> last INR 04/18/19 = 3.5, dose hels then 2.5 qd X 5 mg Mondays and Fridays. INR goal 2-3 per coumadin clinic and SNF MAR.  Last dose 1/13. Pt to be admitted for AMS & FTT.  Admit INR: 4.1 - above goal of 2-3. 04/26/19 SCr 1.2, Hg 13.7, PLTC WNL, INR supratherapeutic  No significant drug interactions with warfarin noted  Goal of Therapy:  INR 2-3 - per coumadin clinic Monitor platelets by anticoagulation protocol: Yes   Plan:  Hold warfarin today Daily INR  Megham Dwyer P. Legrand Como, PharmD, [place] Clinical Pharmacist 04/26/2019 9:33 AM  04/26/2019 9:31 AM

## 2019-04-26 NOTE — Progress Notes (Signed)
PROGRESS NOTE    Brittany [name]  [id number] DOB: 1927-08-05 DOA: 04/29/2019 PCP: Mayra Neer, MD    Brief Narrative:86 y.o. female with history of A. fib/AVR on warfarin, diastolic CHF, pulm HTN, COPD, chronic back pain and recent fall presenting with altered mental status, poor p.o. intake and generalized weakness.  Patient oriented to self and family and not able to provide history at this time.  History provided by patient's daughter at bedside.  Patient had full on 04/11/2019 and evaluated in ED. had multiple imaging which did not reveal acute osseous finding.  She was discharged home, and home health was ordered.  However, he labs were significant for AKI and mild hypercalcemia at that time.   Patient has been doing poorly since that time.  However, she worked well with physical therapy about 4 days ago.  Done here p.o. intake gotten worse in the last 2 days.  She became weaker and confused.  Recently tested negative for COVID-19.  Denies fever, chills, cough, shortness of breath, nausea, vomiting, abdominal pain or UTI symptoms.  Denies slurred speech, facial droop, focal weakness, numbness or tingling.  She has history of "multiple TIAs".  She has chronic back pain unchanged from baseline.  She denies history of dementia.  Independently ambulates at baseline.  She is DNR/DNI.  Daughter is open to comfort measures if no improvement with IV fluids.  In ED, hemodynamically stable.  Normal saturation on RA. Na 129.  K3.3.  Creatinine 1.4 (normal baseline).  Calcium 13.6 and adjusted to 14.6 for low albumin.  Ionized calcium 1.86.  WBC 12.2.  Otherwise, CMP and CBC without significant finding.  Lipase normal.  Influenza and COVID-19 PCR negative.  UA with many bacteria and small Hgb but no LE or nitrites.  CXR without significant finding.  EKG with A. fib without RVR.  NS bolus and maintenance ordered and hospitalist service was called for admission for AKI, hypercalcemia and  FTT. Assessment & Plan:   Active Problems:   Hypercalcemia  #1 hypercalcemia question secondary to decreased nutritional intake and dehydration.  Per family patient gets dehydrated very easily.  Until 2 days prior to admission to hospital she was eating and drinking okay.  She was brought to the ER on New Year's Eve when her calcium was elevated at 12.  Ever since it has been trending up.  Patient received normal saline overnight 100 cc an hour with further increase in calcium to 13.8. I will increase the fluids 150 cc an hour keeping in mind she does have a history of heart failure though her last echo from March 2020 ejection fraction was normal. Calcium 13.9 up from 13.6 at the time of admission up from 12.0 04/11/2019. She is not a candidate for Zometa as we are not sure if the hypercalcemia is due to malignancy. I will give her Decadron 2 doses today and follow-up calcium level in a.m. Continue normal saline Cortisol level normal Will check PTH level  #2 hypokalemia/hyponatremia has been resolved with IV normal saline with potassium.  #3 AKI hold diuretics continue normal saline cautiously and follow-up labs in a.m.  #4 persistent atrial fibrillation continue Cardizem and Coumadin  #5 history of AVR on Coumadin  #6 history of chronic diastolic CHF on torsemide at home which is on hold.  #7 acute metabolic encephalopathy likely secondary to persistent hypercalcemia will monitor closely.  TSH normal.  #8 goals of care-patient is DNR/DNI on admission.  Discussed with patient's daughter will continue IV  fluids another 24 hours if no improvement they will consider comfort measures.     Pressure Injury 05/05/18 Unstageable - Full thickness tissue loss in which the base of the ulcer is covered by slough (yellow, tan, gray, green or brown) and/or eschar (tan, brown or black) in the wound bed. (Active)  05/05/18 0221  Location: Foot  Location Orientation: Anterior;Left  Staging:  Unstageable - Full thickness tissue loss in which the base of the ulcer is covered by slough (yellow, tan, gray, green or brown) and/or eschar (tan, brown or black) in the wound bed.  Wound Description (Comments):   Present on Admission:     Estimated body mass index is 25.49 kg/m as calculated from the following:   Height as of 01/07/19: 5\' 1"  (1.549 m).   Weight as of this encounter: 61.2 kg.  DVT prophylaxis: Coumadin Code Status: DO NOT RESUSCITATE  family Communication: Discussed with patient's daughter Disposition Plan: Pending clinical improvement patient is very hypercalcemic unclear reason   Consultants:   None  Procedures: None Antimicrobials: None  Subjective: She is laying in bed in no acute distress when I called her name she did not open her eyes or answer my questions when I did touch her she opened her eyes and asked what you want  Objective: Vitals:   05/07/2019 1914 04/26/2019 2134 04/26/19 0523 04/26/19 0752  BP: (!) 198/96 (!) 111/52 (!) 188/98 (!) 148/63  Pulse: 81 64 65 66  Resp: 17 16 16 16   Temp: 98.2 F (36.8 C) (!) 97.2 F (36.2 C) (!) 96.6 F (35.9 C) (!) 97.2 F (36.2 C)  TempSrc: Oral Axillary Axillary Oral  SpO2: 96% 96% 93% 95%  Weight:   61.2 kg     Intake/Output Summary (Last 24 hours) at 04/26/2019 1039 Last data filed at 04/26/2019 0900 Gross per 24 hour  Intake 240 ml  Output 550 ml  Net -310 ml   Filed Weights   04/26/19 0523  Weight: 61.2 kg    Examination:  General exam: Appears calm and comfortable  Respiratory system: Clear to auscultation. Respiratory effort normal. Cardiovascular system: S1 & S2 heard, RRR. No JVD, murmurs, rubs, gallops or clicks. No pedal edema. Gastrointestinal system: Abdomen is nondistended, soft and nontender. No organomegaly or masses felt. Normal bowel sounds heard. Central nervous system: Alert and oriented. No focal neurological deficits. Extremities: Symmetric 5 x 5 power. Skin: No rashes,  lesions or ulcers Psychiatry: Judgement and insight appear normal. Mood & affect appropriate.     Data Reviewed: I have personally reviewed following labs and imaging studies  CBC: Recent Labs  Lab 04/21/2019 1538 04/23/2019 1839 04/26/19 0451  WBC 12.2*  --  10.6*  NEUTROABS 7.9*  --   --   HGB 13.1 14.3 13.7  HCT 40.7 42.0 42.3  MCV 88.3  --  87.4  PLT 236  --  123456   Basic Metabolic Panel: Recent Labs  Lab 04/24/2019 1538 04/21/2019 1839 05/04/2019 1938 04/26/19 0451  NA 129* 133*  --  135  K 3.3* 3.2*  --  3.6  CL 89* 93*  --  97*  CO2 30  --   --  29  GLUCOSE 133* 105*  --  103*  BUN 19 17  --  18  CREATININE 1.40* 1.40*  --  1.20*  CALCIUM 13.6*  --   --  13.9*  MG  --   --  1.4* 2.0  PHOS  --   --  3.5 3.2  GFR: Estimated Creatinine Clearance: 26.7 mL/min (A) (by C-G formula based on SCr of 1.2 mg/dL (H)). Liver Function Tests: Recent Labs  Lab 04/18/2019 1538  AST 26  ALT 12  ALKPHOS 118  BILITOT 0.9  PROT 8.6*  ALBUMIN 2.7*   Recent Labs  Lab 04/29/2019 1538  LIPASE 45   Recent Labs  Lab 04/23/2019 1938  AMMONIA 14   Coagulation Profile: Recent Labs  Lab 05/07/2019 1938 04/26/19 0451  INR 4.1* 3.9*   Cardiac Enzymes: Recent Labs  Lab 04/26/19 0451  CKTOTAL 28*   BNP (last 3 results) No results for input(s): PROBNP in the last 8760 hours. HbA1C: Recent Labs    04/26/19 0451  HGBA1C 5.6   CBG: No results for input(s): GLUCAP in the last 168 hours. Lipid Profile: No results for input(s): CHOL, HDL, LDLCALC, TRIG, CHOLHDL, LDLDIRECT in the last 72 hours. Thyroid Function Tests: Recent Labs    04/14/2019 1938  TSH 1.164  1.159   Anemia Panel: Recent Labs    05/01/2019 1938  VITAMINB12 246   Sepsis Labs: No results for input(s): PROCALCITON, LATICACIDVEN in the last 168 hours.  Recent Results (from the past 240 hour(s))  Respiratory Panel by RT PCR (Flu A&B, Covid) - Nasopharyngeal Swab     Status: None   Collection Time: 04/18/2019   3:38 PM   Specimen: Nasopharyngeal Swab  Result Value Ref Range Status   SARS Coronavirus 2 by RT PCR NEGATIVE NEGATIVE Final    Comment: (NOTE) SARS-CoV-2 target nucleic acids are NOT DETECTED. The SARS-CoV-2 RNA is generally detectable in upper respiratoy specimens during the acute phase of infection. The lowest concentration of SARS-CoV-2 viral copies this assay can detect is 131 copies/mL. A negative result does not preclude SARS-Cov-2 infection and should not be used as the sole basis for treatment or other patient management decisions. A negative result may occur with  improper specimen collection/handling, submission of specimen other than nasopharyngeal swab, presence of viral mutation(s) within the areas targeted by this assay, and inadequate number of viral copies (<131 copies/mL). A negative result must be combined with clinical observations, patient history, and epidemiological information. The expected result is Negative. Fact Sheet for Patients:  PinkCheek.be Fact Sheet for Healthcare Providers:  GravelBags.it This test is not yet ap proved or cleared by the [place] FDA and  has been authorized for detection and/or diagnosis of SARS-CoV-2 by FDA under an Emergency Use Authorization (EUA). This EUA will remain  in effect (meaning this test can be used) for the duration of the COVID-19 declaration under Section 564(b)(1) of the Act, 21 U.S.C. section 360bbb-3(b)(1), unless the authorization is terminated or revoked sooner.    Influenza A by PCR NEGATIVE NEGATIVE Final   Influenza B by PCR NEGATIVE NEGATIVE Final    Comment: (NOTE) The Xpert Xpress SARS-CoV-2/FLU/RSV assay is intended as an aid in  the diagnosis of influenza from Nasopharyngeal swab specimens and  should not be used as a sole basis for treatment. Nasal washings and  aspirates are unacceptable for Xpert Xpress SARS-CoV-2/FLU/RSV   testing. Fact Sheet for Patients: PinkCheek.be Fact Sheet for Healthcare Providers: GravelBags.it This test is not yet approved or cleared by the [place] FDA and  has been authorized for detection and/or diagnosis of SARS-CoV-2 by  FDA under an Emergency Use Authorization (EUA). This EUA will remain  in effect (meaning this test can be used) for the duration of the  Covid-19 declaration under Section 564(b)(1) of the Act, 21  U.S.C. section 360bbb-3(b)(1), unless the authorization is  terminated or revoked. Performed at [facility], [place] [address], [place], [place] [address]          Radiology Studies: [facility] Chest Port 1 View  Result Date: 05/10/2019 CLINICAL DATA:  ALOC short of breath EXAM: PORTABLE CHEST 1 VIEW COMPARISON:  06/24/2018 FINDINGS: Post sternotomy changes and valve prosthesis. Trace left pleural effusion. Enlarged cardiomediastinal silhouette without overt failure. Aortic atherosclerosis. No pneumothorax. IMPRESSION: 1. Suspect trace left effusion. Small foci of atelectasis at the bases. 2. Mild cardiomegaly Electronically Signed   By: Donavan Foil M.D.   On: 04/24/2019 15:50        Scheduled Meds: . acetaminophen  1,000 mg Oral Q8H  . atorvastatin  20 mg Oral Daily  . busPIRone  15 mg Oral BID  . cycloSPORINE  1 drop Both Eyes BID  . diclofenac Sodium  2 g Topical QID  . diltiazem  120 mg Oral BID  . docusate sodium  100 mg Oral BID  . fentaNYL (SUBLIMAZE) injection  12.5 mcg Intravenous Once  . pantoprazole  40 mg Oral BID   Continuous Infusions: . 0.9 % NaCl with KCl 20 mEq / L    . zoledronic acid (ZOMETA) IV       LOS: 1 day     Georgette Shell, MD [facility]  If 7PM-7AM, please contact night-coverage [email or web address] Password [facility] 04/26/2019, 10:39 AM

## 2019-04-27 LAB — COMPREHENSIVE METABOLIC PANEL
ALT: 11 U/L (ref 0–44)
AST: 23 U/L (ref 15–41)
Albumin: 2.5 g/dL — ABNORMAL LOW (ref 3.5–5.0)
Alkaline Phosphatase: 115 U/L (ref 38–126)
Anion gap: 7 (ref 5–15)
BUN: 24 mg/dL — ABNORMAL HIGH (ref 8–23)
CO2: 24 mmol/L (ref 22–32)
Calcium: 12.8 mg/dL — ABNORMAL HIGH (ref 8.9–10.3)
Chloride: 105 mmol/L (ref 98–111)
Creatinine, Ser: 1.22 mg/dL — ABNORMAL HIGH (ref 0.44–1.00)
GFR calc Af Amer: 45 mL/min — ABNORMAL LOW (ref >60)
GFR calc non Af Amer: 39 mL/min — ABNORMAL LOW (ref >60)
Glucose, Bld: 146 mg/dL — ABNORMAL HIGH (ref 70–99)
Potassium: 3.6 mmol/L (ref 3.5–5.1)
Sodium: 136 mmol/L (ref 135–145)
Total Bilirubin: 0.8 mg/dL (ref 0.3–1.2)
Total Protein: 8.3 g/dL — ABNORMAL HIGH (ref 6.5–8.1)

## 2019-04-27 LAB — PROTIME-INR
INR: 3.5 — ABNORMAL HIGH (ref 0.8–1.2)
Prothrombin Time: 34.8 seconds — ABNORMAL HIGH (ref 11.4–15.2)

## 2019-04-27 MED ORDER — WARFARIN - PHARMACIST DOSING INPATIENT: Freq: Every day | Status: DC

## 2019-04-27 MED ORDER — LORAZEPAM 2 MG/ML IJ SOLN
1.0000 mg | INTRAMUSCULAR | Status: DC | PRN
  Administered 2019-04-27 – 2019-04-28 (×4): 1 mg via INTRAVENOUS
  Filled 2019-04-27 (×4): qty 1

## 2019-04-27 MED ORDER — MORPHINE SULFATE (PF) 2 MG/ML IV SOLN
2.0000 mg | INTRAVENOUS | Status: DC | PRN
  Administered 2019-04-27 – 2019-04-28 (×6): 2 mg via INTRAVENOUS
  Filled 2019-04-27 (×6): qty 1

## 2019-04-27 NOTE — Progress Notes (Signed)
[place] for coumadin Indication: AVR/Afib  Allergies  Allergen Reactions  . Amlodipine Swelling and Other (See Comments)     Unspecified swelling reaction  . Amoxicillin Diarrhea, Nausea And Vomiting and Other (See Comments)    Has patient had a PCN reaction causing immediate rash, facial/tongue/throat swelling, SOB or lightheadedness with hypotension: No Has patient had a PCN reaction causing severe rash involving mucus membranes or skin necrosis: No Has patient had a PCN reaction that required hospitalization: No Has patient had a PCN reaction occurring within the last 10 years: No If all of the above answers are "NO", then may proceed with Cephalosporin use.  . Carafate [Sucralfate] Swelling and Other (See Comments)    Reaction:  Knee swelling/redness   . Cephalexin Diarrhea  . Ciprofloxacin   . Codeine Nausea And Vomiting  . Irbesartan Swelling and Other (See Comments)    Reaction:  Facial/hand swelling and numbness   . Morphine And Related Other (See Comments)    Pt states that she has a history of addiction with this medication.      . Neurontin [Gabapentin] Swelling and Other (See Comments)    Reaction:  Leg swelling   . Nitrofurantoin Monohyd Macro Diarrhea and Nausea And Vomiting  . Penicillins   . Prednisone Other (See Comments)    Reaction:  Elevated BP  . Sulfa Antibiotics Rash    Patient Measurements: Weight: 141 lb 5 oz (64.1 kg)  Vital Signs: Temp: 98.4 F (36.9 C) (01/16 0446) Temp Source: Oral (01/16 0446) BP: 154/88 (01/16 0446) Pulse Rate: 63 (01/16 0446)  Labs: Recent Labs    04/19/2019 1538 05/02/2019 1538 05/02/2019 1839 04/27/2019 1938 04/26/19 0451 04/27/19 0629  HGB 13.1   < > 14.3  --  13.7  --   HCT 40.7  --  42.0  --  42.3  --   PLT 236  --   --   --  255  --   APTT  --   --   --   --  39*  --   LABPROT  --   --   --  39.7* 38.6* 34.8*  INR  --   --   --  4.1* 3.9* 3.5*  CREATININE 1.40*   < > 1.40*   --  1.20* 1.22*  CKTOTAL  --   --   --   --  28*  --    < > = values in this interval not displayed.    Estimated Creatinine Clearance: 26.8 mL/min (A) (by C-G formula based on SCr of 1.22 mg/dL (H)).   Medical History: Past Medical History:  Diagnosis Date  . Abnormal liver diagnostic imaging    suspected cirrhosis  . Arthritis   . Atrial fibrillation ([place])   . Blind    legally  . Chronic back pain   . COPD (chronic obstructive pulmonary disease) ([place])   . Diverticulosis   . Dyslipidemia    takes Lipitor daily  . Dysrhythmia    Atrial Fibrillation  . Foot drop    SINCE 1987  . GERD (gastroesophageal reflux disease)   . H/O hiatal hernia   . Hearing impaired    Bilateral hearing aids  . History of bacterial endocarditis   . History of gastritis   . History of stomach ulcers    many yrs ago  . History of TIA (transient ischemic attack)    2013-- RESIDUAL PERIPHERAL VISION RIGHT EYE--  RESOLVED  . Hypertension   .  IC (interstitial cystitis)   . Macular degeneration   . Myocardial infarction ([place])   . Pulmonary hypertension ([place]) 10/2013   Based on TTE  . S/P aortic valve replacement    2005  . Stroke [facility])    TIA, no preherial vison   . Urine incontinence   . Wears dentures   . Wears glasses    On warfarin PTA: Per outpatient Coumadin clinic pt> last INR 04/18/19 = 3.5, dose held then 2.5 daily X 5 mg on Mondays and Fridays.  Last dose 1/13  Assessment: 86 yo F on chronic coumadin PTA for Afib and AVR in 2005. INR elevated at 4.1 on admission.  No bleeding noted.  CBC WNL.   04/27/2019:  INR remains elevated at 3.5 but trending down  CBC WNL on 1/14  No bleeding noted  Minimal oral intake charted.  No major drug-drug interactions  Goal of Therapy:  INR 2-3 per Coumadin clinic   Plan:  Continue to hold warfarin today Daily INR  Netta Cedars, PharmD, BCPS Clinical Pharmacist 04/27/2019 12:41 PM

## 2019-04-27 NOTE — Progress Notes (Signed)
PROGRESS NOTE    Brittany [name]  [id number] DOB: 07/20/1927 DOA: 04/24/2019 PCP: Mayra Neer, MD   Brief Narrative: 86 y.o.femalewith history ofA. fib/AVR on warfarin, diastolic CHF,pulmHTN, COPD, chronic back pain and recent fall presenting with altered mental status, poor p.o. intakeandgeneralized weakness.  Patient oriented to self and family and not able to provide history at this time. History provided by patient's daughter at bedside.  Patient had full on 04/11/2019 and evaluated in ED.had multiple imaging which did not reveal acute osseous finding.She was discharged home, andhome health was ordered. However, he labs were significant for AKI and mild hypercalcemia at that time.  Patient has been doing poorly since that time. However, she worked well with physical therapy about 4 days ago.Done here p.o. intake gotten worse in the last 2 days. She became weaker andconfused. Recently tested negative for COVID-19.Denies fever, chills, cough, shortness of breath, nausea, vomiting, abdominal pain or UTI symptoms. Denies slurred speech, facial droop, focal weakness, numbness or tingling. She has history of "multiple TIAs".She has chronic back pain unchanged from baseline. She denies history of dementia.  Independently ambulates at baseline. She is DNR/DNI.Daughter is open to comfort measures if no improvement with IV fluids.  In ED, hemodynamically stable. Normal saturation on RA. [id number]. K3.3. Creatinine 1.4 (normal baseline).Calcium 13.6 and adjusted to 14.6 for low albumin. Ionized calcium 1.86. WBC 12.2. Otherwise, CMP and CBC without significant finding. Lipase normal. Influenza and COVID-19 PCR negative. UA with many bacteria and small Hgb but no LE or nitrites. CXR without significant finding. EKG with A. fib without RVR. NS bolus and maintenance ordered and hospitalist service was called for admission for AKI, hypercalcemia and  FTT  Assessment & Plan:   Active Problems:   Delirium   Hypercalcemia   AKI (acute kidney injury) ([place])   #1 hypercalcemia likely secondary to decreased nutritional intake and dehydration.  She has not made any significant improvement.  She continues to report to her family that it is time for her to go she does not want any aggressive intervention or even artificial nutrition or IV fluids.  Discussed with family they agree and want their mother to be comfort care.  She has expressed many times that she wants to go and be with her husband and her son who passed away 5 years ago.  I will change her to comfort care.   Patient is DO NOT RESUSCITATE on admission.  Pressure Injury 05/05/18 Unstageable - Full thickness tissue loss in which the base of the ulcer is covered by slough (yellow, tan, gray, green or brown) and/or eschar (tan, brown or black) in the wound bed. (Active)  05/05/18 0221  Location: Foot  Location Orientation: Anterior;Left  Staging: Unstageable - Full thickness tissue loss in which the base of the ulcer is covered by slough (yellow, tan, gray, green or brown) and/or eschar (tan, brown or black) in the wound bed.  Wound Description (Comments):   Present on Admission:       Nutrition Problem: Increased nutrient needs Etiology: acute illness, wound healing     Signs/Symptoms: estimated needs    Interventions: Ensure Enlive (each supplement provides 350kcal and 20 grams of protein), Magic cup, MVI  Estimated body mass index is 26.7 kg/m as calculated from the following:   Height as of 01/07/19: 5\' 1"  (1.549 m).   Weight as of this encounter: 64.1 kg.  DVT prophylaxis: None patient is comfort care  code Status: DO NOT RESUSCITATE  family Communication: Discussed  with patient's daughter Disposition Plan:  Expect hospital death   Consultants:   None  Procedures: None Antimicrobials: None  Subjective:  Patient in bed eyes closed does not respond to  questions Objective: Vitals:   04/26/19 2027 04/27/19 0446 04/27/19 0717 04/27/19 1432  BP: (!) 171/85 (!) 154/88  115/84  Pulse: 81 63  75  Resp: 18 14  20   Temp: 98.3 F (36.8 C) 98.4 F (36.9 C)  99.3 F (37.4 C)  TempSrc: Oral Oral    SpO2: 97% 94%  92%  Weight:   64.1 kg     Intake/Output Summary (Last 24 hours) at 04/27/2019 1454 Last data filed at 04/27/2019 1000 Gross per 24 hour  Intake 3398.32 ml  Output 1 ml  Net 3397.32 ml   Filed Weights   04/26/19 0523 04/27/19 0717  Weight: 61.2 kg 64.1 kg    Examination:  General exam: Appears calm and comfortable  Respiratory system: Clear to auscultation. Respiratory effort normal. Cardiovascular system: S1 & S2 heard, RRR. No JVD, murmurs, rubs, gallops or clicks. No pedal edema. Gastrointestinal system: Abdomen is nondistended, soft and nontender. No organomegaly or masses felt. Normal bowel sounds heard. Central nervous system: Confused  extremities: Symmetric 5 x 5 power. Skin: No rashes, lesions or ulcers Psychiatry: Unable to assess    Data Reviewed: I have personally reviewed following labs and imaging studies  CBC: Recent Labs  Lab 05/11/2019 1538 04/20/2019 1839 04/26/19 0451  WBC 12.2*  --  10.6*  NEUTROABS 7.9*  --   --   HGB 13.1 14.3 13.7  HCT 40.7 42.0 42.3  MCV 88.3  --  87.4  PLT 236  --  123456   Basic Metabolic Panel: Recent Labs  Lab 05/07/2019 1538 04/12/2019 1839 04/26/2019 1938 04/26/19 0451 04/27/19 0629  NA 129* 133*  --  135 136  K 3.3* 3.2*  --  3.6 3.6  CL 89* 93*  --  97* 105  CO2 30  --   --  29 24  GLUCOSE 133* 105*  --  103* 146*  BUN 19 17  --  18 24*  CREATININE 1.40* 1.40*  --  1.20* 1.22*  CALCIUM 13.6*  --   --  13.9* 12.8*  MG  --   --  1.4* 2.0  --   PHOS  --   --  3.5 3.2  --    GFR: Estimated Creatinine Clearance: 26.8 mL/min (A) (by C-G formula based on SCr of 1.22 mg/dL (H)). Liver Function Tests: Recent Labs  Lab 05/05/2019 1538 04/27/19 0629  AST 26 23  ALT  12 11  ALKPHOS 118 115  BILITOT 0.9 0.8  PROT 8.6* 8.3*  ALBUMIN 2.7* 2.5*   Recent Labs  Lab 04/12/2019 1538  LIPASE 45   Recent Labs  Lab 05/07/2019 1938  AMMONIA 14   Coagulation Profile: Recent Labs  Lab 04/21/2019 1938 04/26/19 0451 04/27/19 0629  INR 4.1* 3.9* 3.5*   Cardiac Enzymes: Recent Labs  Lab 04/26/19 0451  CKTOTAL 28*   BNP (last 3 results) No results for input(s): PROBNP in the last 8760 hours. HbA1C: Recent Labs    04/26/19 0451  HGBA1C 5.6   CBG: No results for input(s): GLUCAP in the last 168 hours. Lipid Profile: No results for input(s): CHOL, HDL, LDLCALC, TRIG, CHOLHDL, LDLDIRECT in the last 72 hours. Thyroid Function Tests: Recent Labs    05/11/2019 1938  TSH 1.164  1.159   Anemia Panel: Recent Labs  04/21/2019 1938  VITAMINB12 246   Sepsis Labs: No results for input(s): PROCALCITON, LATICACIDVEN in the last 168 hours.  Recent Results (from the past 240 hour(s))  Respiratory Panel by RT PCR (Flu A&B, Covid) - Nasopharyngeal Swab     Status: None   Collection Time: 05/12/2019  3:38 PM   Specimen: Nasopharyngeal Swab  Result Value Ref Range Status   SARS Coronavirus 2 by RT PCR NEGATIVE NEGATIVE Final    Comment: (NOTE) SARS-CoV-2 target nucleic acids are NOT DETECTED. The SARS-CoV-2 RNA is generally detectable in upper respiratoy specimens during the acute phase of infection. The lowest concentration of SARS-CoV-2 viral copies this assay can detect is 131 copies/mL. A negative result does not preclude SARS-Cov-2 infection and should not be used as the sole basis for treatment or other patient management decisions. A negative result may occur with  improper specimen collection/handling, submission of specimen other than nasopharyngeal swab, presence of viral mutation(s) within the areas targeted by this assay, and inadequate number of viral copies (<131 copies/mL). A negative result must be combined with clinical observations,  patient history, and epidemiological information. The expected result is Negative. Fact Sheet for Patients:  PinkCheek.be Fact Sheet for Healthcare Providers:  GravelBags.it This test is not yet ap proved or cleared by the [place] FDA and  has been authorized for detection and/or diagnosis of SARS-CoV-2 by FDA under an Emergency Use Authorization (EUA). This EUA will remain  in effect (meaning this test can be used) for the duration of the COVID-19 declaration under Section 564(b)(1) of the Act, 21 U.S.C. section 360bbb-3(b)(1), unless the authorization is terminated or revoked sooner.    Influenza A by PCR NEGATIVE NEGATIVE Final   Influenza B by PCR NEGATIVE NEGATIVE Final    Comment: (NOTE) The Xpert Xpress SARS-CoV-2/FLU/RSV assay is intended as an aid in  the diagnosis of influenza from Nasopharyngeal swab specimens and  should not be used as a sole basis for treatment. Nasal washings and  aspirates are unacceptable for Xpert Xpress SARS-CoV-2/FLU/RSV  testing. Fact Sheet for Patients: PinkCheek.be Fact Sheet for Healthcare Providers: GravelBags.it This test is not yet approved or cleared by the [place] FDA and  has been authorized for detection and/or diagnosis of SARS-CoV-2 by  FDA under an Emergency Use Authorization (EUA). This EUA will remain  in effect (meaning this test can be used) for the duration of the  Covid-19 declaration under Section 564(b)(1) of the Act, 21  U.S.C. section 360bbb-3(b)(1), unless the authorization is  terminated or revoked. Performed at [facility], [place] [address] [place], [place] [address]   Urine culture     Status: Abnormal   Collection Time: 04/28/2019  3:38 PM   Specimen: Urine, Random  Result Value Ref Range Status   Specimen Description   Final    URINE, RANDOM Performed at [place] [address], [place], [place] [address]    Special Requests   Final    NONE Performed at [facility], [place] [address] [place],  [address]    Culture (A)  Final    <10,000 COLONIES/mL INSIGNIFICANT GROWTH Performed at [place] [address] [place],  [address]    Report Status 04/26/2019 FINAL  Final         Radiology Studies: DG Chest Port 1 View  Result Date: 05/11/2019 CLINICAL DATA:  ALOC short of breath EXAM: PORTABLE CHEST 1 VIEW COMPARISON:  06/24/2018 FINDINGS: Post  sternotomy changes and valve prosthesis. Trace left pleural effusion. Enlarged cardiomediastinal silhouette without overt failure. Aortic atherosclerosis. No pneumothorax. IMPRESSION: 1. Suspect trace left effusion. Small foci of atelectasis at the bases. 2. Mild cardiomegaly Electronically Signed   By: Donavan Foil M.D.   On: 05/08/2019 15:50        Scheduled Meds: . acetaminophen  1,000 mg Oral Q8H  . atorvastatin  20 mg Oral Daily  . busPIRone  15 mg Oral BID  . cycloSPORINE  1 drop Both Eyes BID  . diclofenac Sodium  2 g Topical QID  . diltiazem  120 mg Oral BID  . docusate sodium  100 mg Oral BID  . feeding supplement (ENSURE ENLIVE)  237 mL Oral BID BM  . fentaNYL (SUBLIMAZE) injection  12.5 mcg Intravenous Once  . multivitamin with minerals  1 tablet Oral Daily  . pantoprazole  40 mg Oral BID  . [START ON 04/28/2019] Warfarin - Pharmacist Dosing Inpatient   Does not apply q1800   Continuous Infusions: . sodium chloride 150 mL/hr at 04/27/19 1428     LOS: 2 days     Georgette Shell, MD [facility]   If 7PM-7AM, please contact night-coverage [email or web address] Password [facility] 04/27/2019, 2:54 PM

## 2019-04-27 NOTE — Evaluation (Signed)
Occupational Therapy Evaluation Patient Details Name: Brittany [name] MRN: [id number] DOB: 1928-01-04 Today's Date: 84/16/2021    History of Present Illness 86 y.o. female with history of A. fib/AVR on warfarin, diastolic CHF, pulm HTN, COPD, chronic back pain and recent fall presenting with altered mental status, poor p.o. intake and generalized weakness from ALF   Clinical Impression   Pt lethargic. Speaking at times, but mostly non sensical with the exception of, "I don't want to," when asked to open her eyes. Requiring total assist for all ADL and bed level mobility. Per son who is bedside, pt was ambulating with a RW and "fairly independent" until her recent fall at which time she became dependent on w/c. No indicators of pain with rolling. Recommending SNF.    Follow Up Recommendations  SNF;Supervision/Assistance - 24 hour    Equipment Recommendations  None recommended by OT    Recommendations for Other Services       Precautions / Restrictions Precautions Precautions: Fall Precaution Comments: very HOH      Mobility Bed Mobility Overal bed mobility: Needs Assistance Bed Mobility: Rolling Rolling: Total assist         General bed mobility comments: pt without any initiation, assisted to reposition  Transfers                 General transfer comment: deferred due to lethargy    Balance                                           ADL either performed or assessed with clinical judgement   ADL                                         General ADL Comments: currently requiring total assist     Vision Baseline Vision/History: Wears glasses Additional Comments: unable to assess, pt not opening eyes     Perception     Praxis      Pertinent Vitals/Pain Pain Assessment: Faces Faces Pain Scale: No hurt     Hand Dominance Right   Extremity/Trunk Assessment Upper Extremity Assessment Upper Extremity Assessment:  Generalized weakness(tremor with ROM)   Lower Extremity Assessment Lower Extremity Assessment: Defer to PT evaluation       Communication Communication Communication: HOH   Cognition Arousal/Alertness: Lethargic Behavior During Therapy: Flat affect                                   General Comments: pt talking at times, but non sensical, not following commands, eyes closed   General Comments       Exercises     Shoulder Instructions      Home Living Family/patient expects to be discharged to:: Assisted living                             Home Equipment: Walker - 2 wheels          Prior Functioning/Environment Level of Independence: Needs assistance  Gait / Transfers Assistance Needed: typically walks with a RW, was in w/c since a fall x 2 weeks ago and having HHPT at her ALF ADL's / Homemaking Assistance Needed:  typically is assisted for showering, but otherwise fairly independent in ADL with RW, has been requiring extensive assistance in last 2 weeks since a fall            OT Problem List: Decreased strength;Decreased cognition;Decreased activity tolerance      OT Treatment/Interventions: Self-care/ADL training;Therapeutic activities;Balance training    OT Goals(Current goals can be found in the care plan section) Acute Rehab OT Goals OT Goal Formulation: With family Time For Goal Achievement: 05/11/19 Potential to Achieve Goals: Fair ADL Goals Pt Will Perform Eating: with mod assist;sitting;bed level Pt Will Perform Grooming: with mod assist;sitting;bed level Additional ADL Goal #1: Pt will follow commands 25 % of time. Additional ADL Goal #2: Pt will sit EOB with min assist with eyes open x 5 minutes.  OT Frequency: Min 2X/week   Barriers to D/C:            Co-evaluation              AM-PAC OT "6 Clicks" Daily Activity     Outcome Measure Help from another person eating meals?: Total Help from another person taking  care of personal grooming?: Total Help from another person toileting, which includes using toliet, bedpan, or urinal?: Total Help from another person bathing (including washing, rinsing, drying)?: Total Help from another person to put on and taking off regular upper body clothing?: Total Help from another person to put on and taking off regular lower body clothing?: Total 6 Click Score: 6   End of Session    Activity Tolerance: Patient limited by lethargy Patient left: in bed;with call bell/phone within reach;with bed alarm set;with family/visitor present  OT Visit Diagnosis: Muscle weakness (generalized) (M62.81)                Time: 1055-1110 OT Time Calculation (min): 15 min Charges:  OT General Charges $OT Visit: 1 Visit OT Evaluation $OT Eval Moderate Complexity: 1 Mod  Nestor Lewandowsky, OTR/L Acute Rehabilitation Services Pager: [phone] Office: [phone]  Malka So 04/27/2019, 11:43 AM

## 2019-04-28 LAB — PTH, INTACT AND CALCIUM
Calcium, Total (PTH): 13.4 mg/dL (ref 8.7–10.3)
PTH: 12 pg/mL — ABNORMAL LOW (ref 15–65)

## 2019-04-28 MED ORDER — LORAZEPAM 2 MG/ML IJ SOLN
1.0000 mg | INTRAMUSCULAR | Status: DC
  Administered 2019-04-28 – 2019-04-30 (×24): 1 mg via INTRAVENOUS
  Filled 2019-04-28 (×24): qty 1

## 2019-04-28 MED ORDER — MORPHINE SULFATE (PF) 2 MG/ML IV SOLN
2.0000 mg | INTRAVENOUS | Status: DC
  Administered 2019-04-28 – 2019-04-30 (×24): 2 mg via INTRAVENOUS
  Filled 2019-04-28 (×24): qty 1

## 2019-04-28 NOTE — Progress Notes (Addendum)
PROGRESS NOTE    Brittany [name]  [id number] DOB: [birth year] DOA: 04/13/2019 PCP: Mayra Neer, MD   Brief Narrative: 86 y.o.femalewith history ofA. fib/AVR on warfarin, diastolic CHF,pulmHTN, COPD, chronic back pain and recent fall presenting with altered mental status, poor p.o. intakeandgeneralized weakness.  Patient oriented to self and family and not able to provide history at this time. History provided by patient's daughter at bedside.  Patient had full on 04/11/2019 and evaluated in ED.had multiple imaging which did not reveal acute osseous finding.She was discharged home, andhome health was ordered. However, he labs were significant for AKI and mild hypercalcemia at that time.  Patient has been doing poorly since that time. However, she worked well with physical therapy about 4 days ago.Done here p.o. intake gotten worse in the last 2 days. She became weaker andconfused. Recently tested negative for COVID-19.Denies fever, chills, cough, shortness of breath, nausea, vomiting, abdominal pain or UTI symptoms. Denies slurred speech, facial droop, focal weakness, numbness or tingling. She has history of "multiple TIAs".She has chronic back pain unchanged from baseline. She denies history of dementia.  Independently ambulates at baseline. She is DNR/DNI.Daughter is open to comfort measures if no improvement with IV fluids.  In ED, hemodynamically stable. Normal saturation on RA. [id number]. K3.3. Creatinine 1.4 (normal baseline).Calcium 13.6 and adjusted to 14.6 for low albumin. Ionized calcium 1.86. WBC 12.2. Otherwise, CMP and CBC without significant finding. Lipase normal. Influenza and COVID-19 PCR negative. UA with many bacteria and small Hgb but no LE or nitrites. CXR without significant finding. EKG with A. fib without RVR. NS bolus and maintenance ordered and hospitalist service was called for admission for AKI, hypercalcemia and  FTT  Assessment & Plan:   Active Problems:   Delirium   Hypercalcemia   AKI (acute kidney injury) ([place])   #1 hypercalcemia likely secondary to decreased nutritional intake and dehydration.  She has not made any significant improvement.  She continues to report to her family that it is time for her to go she does not want any aggressive intervention or even artificial nutrition or IV fluids.  Discussed with family they agree and want their mother to be comfort care.  She has expressed many times that she wants to go and be with her husband and her son who passed away 5 years ago.  I will change her to comfort care.  Patient is DO NOT RESUSCITATE on admission Patient was in some distress on Ativan and morphine as needed family requested to have Ativan and morphine on a standing dose for comfort. Patient has history of A. fib RVR, diastolic CHF, pulmonary hypertension, COPD,.  Pressure Injury 05/05/18 Unstageable - Full thickness tissue loss in which the base of the ulcer is covered by slough (yellow, tan, gray, green or brown) and/or eschar (tan, brown or black) in the wound bed. (Active)  05/05/18 0221  Location: Foot  Location Orientation: Anterior;Left  Staging: Unstageable - Full thickness tissue loss in which the base of the ulcer is covered by slough (yellow, tan, gray, green or brown) and/or eschar (tan, brown or black) in the wound bed.  Wound Description (Comments):   Present on Admission:       Nutrition Problem: Increased nutrient needs Etiology: acute illness, wound healing     Signs/Symptoms: estimated needs    Interventions: Ensure Enlive (each supplement provides 350kcal and 20 grams of protein), Magic cup, MVI  Estimated body mass index is 26.7 kg/m as calculated from the following:  Height as of 01/07/19: 5\' 1"  (1.549 m).   Weight as of this encounter: 64.1 kg.    Subjective: Resting comfortably  Objective: Vitals:   04/27/19 0446 04/27/19 0717  04/27/19 1432 04/28/19 0532  BP: (!) 154/88  115/84 (!) 160/112  Pulse: 63  75 87  Resp: 14  20 16   Temp: 98.4 F (36.9 C)  99.3 F (37.4 C) 98.5 F (36.9 C)  TempSrc: Oral   Axillary  SpO2: 94%  92% 92%  Weight:  64.1 kg      Intake/Output Summary (Last 24 hours) at 04/28/2019 1428 Last data filed at 04/27/2019 1743 Gross per 24 hour  Intake --  Output 900 ml  Net -900 ml   Filed Weights   04/26/19 0523 04/27/19 0717  Weight: 61.2 kg 64.1 kg    Examination:  General exam: Appears calm and comfortable  Respiratory system: Clear to auscultation. Respiratory effort normal. Cardiovascular system: S1 & S2 heard, RRR. No JVD, murmurs, rubs, gallops or clicks. No pedal edema. Gastrointestinal system: Abdomen is nondistended, soft and nontender. No organomegaly or masses felt. Normal bowel sounds heard. Central nervous system: Unresponsive  extremities: Symmetric 5 x 5 power. Skin: No rashes, lesions or ulcers Psychiatry: Nonresponsive    Data Reviewed: I have personally reviewed following labs and imaging studies  CBC: Recent Labs  Lab 05/04/2019 1538 04/17/2019 1839 04/26/19 0451  WBC 12.2*  --  10.6*  NEUTROABS 7.9*  --   --   HGB 13.1 14.3 13.7  HCT 40.7 42.0 42.3  MCV 88.3  --  87.4  PLT 236  --  123456   Basic Metabolic Panel: Recent Labs  Lab 04/13/2019 1538 05/05/2019 1839 04/26/2019 1938 04/26/19 0451 04/27/19 0629  NA 129* 133*  --  135 136  K 3.3* 3.2*  --  3.6 3.6  CL 89* 93*  --  97* 105  CO2 30  --   --  29 24  GLUCOSE 133* 105*  --  103* 146*  BUN 19 17  --  18 24*  CREATININE 1.40* 1.40*  --  1.20* 1.22*  CALCIUM 13.6*  --   --  13.9* 12.8*  13.4  MG  --   --  1.4* 2.0  --   PHOS  --   --  3.5 3.2  --    GFR: Estimated Creatinine Clearance: 26.8 mL/min (A) (by C-G formula based on SCr of 1.22 mg/dL (H)). Liver Function Tests: Recent Labs  Lab 05/07/2019 1538 04/27/19 0629  AST 26 23  ALT 12 11  ALKPHOS 118 115  BILITOT 0.9 0.8  PROT 8.6*  8.3*  ALBUMIN 2.7* 2.5*   Recent Labs  Lab 05/08/2019 1538  LIPASE 45   Recent Labs  Lab 04/23/2019 1938  AMMONIA 14   Coagulation Profile: Recent Labs  Lab 04/13/2019 1938 04/26/19 0451 04/27/19 0629  INR 4.1* 3.9* 3.5*   Cardiac Enzymes: Recent Labs  Lab 04/26/19 0451  CKTOTAL 28*   BNP (last 3 results) No results for input(s): PROBNP in the last 8760 hours. HbA1C: Recent Labs    04/26/19 0451  HGBA1C 5.6   CBG: No results for input(s): GLUCAP in the last 168 hours. Lipid Profile: No results for input(s): CHOL, HDL, LDLCALC, TRIG, CHOLHDL, LDLDIRECT in the last 72 hours. Thyroid Function Tests: Recent Labs    04/23/2019 1938  TSH 1.164  1.159   Anemia Panel: Recent Labs    04/15/2019 1938  VITAMINB12 246   Sepsis Labs:  No results for input(s): PROCALCITON, LATICACIDVEN in the last 168 hours.  Recent Results (from the past 240 hour(s))  Respiratory Panel by RT PCR (Flu A&B, Covid) - Nasopharyngeal Swab     Status: None   Collection Time: 05/07/2019  3:38 PM   Specimen: Nasopharyngeal Swab  Result Value Ref Range Status   SARS Coronavirus 2 by RT PCR NEGATIVE NEGATIVE Final    Comment: (NOTE) SARS-CoV-2 target nucleic acids are NOT DETECTED. The SARS-CoV-2 RNA is generally detectable in upper respiratoy specimens during the acute phase of infection. The lowest concentration of SARS-CoV-2 viral copies this assay can detect is 131 copies/mL. A negative result does not preclude SARS-Cov-2 infection and should not be used as the sole basis for treatment or other patient management decisions. A negative result may occur with  improper specimen collection/handling, submission of specimen other than nasopharyngeal swab, presence of viral mutation(s) within the areas targeted by this assay, and inadequate number of viral copies (<131 copies/mL). A negative result must be combined with clinical observations, patient history, and epidemiological information.  The expected result is Negative. Fact Sheet for Patients:  PinkCheek.be Fact Sheet for Healthcare Providers:  GravelBags.it This test is not yet ap proved or cleared by the [place] FDA and  has been authorized for detection and/or diagnosis of SARS-CoV-2 by FDA under an Emergency Use Authorization (EUA). This EUA will remain  in effect (meaning this test can be used) for the duration of the COVID-19 declaration under Section 564(b)(1) of the Act, 21 U.S.C. section 360bbb-3(b)(1), unless the authorization is terminated or revoked sooner.    Influenza A by PCR NEGATIVE NEGATIVE Final   Influenza B by PCR NEGATIVE NEGATIVE Final    Comment: (NOTE) The Xpert Xpress SARS-CoV-2/FLU/RSV assay is intended as an aid in  the diagnosis of influenza from Nasopharyngeal swab specimens and  should not be used as a sole basis for treatment. Nasal washings and  aspirates are unacceptable for Xpert Xpress SARS-CoV-2/FLU/RSV  testing. Fact Sheet for Patients: PinkCheek.be Fact Sheet for Healthcare Providers: GravelBags.it This test is not yet approved or cleared by the [place] FDA and  has been authorized for detection and/or diagnosis of SARS-CoV-2 by  FDA under an Emergency Use Authorization (EUA). This EUA will remain  in effect (meaning this test can be used) for the duration of the  Covid-19 declaration under Section 564(b)(1) of the Act, 21  U.S.C. section 360bbb-3(b)(1), unless the authorization is  terminated or revoked. Performed at [facility], [place] [address], [place], [place] [address]   Urine culture     Status: Abnormal   Collection Time: 04/16/2019  3:38 PM   Specimen: Urine, Random  Result Value Ref Range Status   Specimen Description   Final    URINE, RANDOM Performed at [place] [address],  [place], [place] [address]    Special Requests   Final    NONE Performed at [facility], [place] [address] [place], [place] [address]    Culture (A)  Final    <10,000 COLONIES/mL INSIGNIFICANT GROWTH Performed at [place] [address] [place], [place] [address]    Report Status 04/26/2019 FINAL  Final         Radiology Studies: No results found.      Scheduled Meds: . fentaNYL (SUBLIMAZE) injection  12.5 mcg Intravenous Once   Continuous Infusions:   LOS: 3 days     Georgette Shell,  MD [facility]  If 7PM-7AM, please contact night-coverage [email or web address] Password [facility] 04/28/2019, 2:28 PM

## 2019-04-28 NOTE — Progress Notes (Signed)
CRITICAL VALUE ALERT  Critical Value:  Calcium 13.4  Date & Time Notied:  04/28/2019 0945  Provider Notified: Dr. Rodena Piety  Orders Received/Actions taken: none at this time, will cont to monitor

## 2019-04-29 NOTE — Plan of Care (Signed)

## 2019-04-29 NOTE — TOC Transition Note (Signed)
Transition of Care [facility]) - CM/SW Discharge Note   Patient Details  Name: Brittany [name] MRN: [id number] Date of Birth: 1927-07-11  [facility] CM/SW Contact:  Trish Mage, LCSW Phone Number: 04/29/2019, 11:56 AM   Clinical Narrative:   Consulted with Dr about making a referral to hospice home.  Plan for hospital death. TOC will continue to follow during the course of hospitalization.      Final next level of care: Other (comment)(Likely hospital death) Barriers to Discharge: No Barriers Identified   Patient Goals and CMS Choice        Discharge Placement                       Discharge Plan and Services   Discharge Planning Services: CM Consult                                 Social Determinants of Health (SDOH) Interventions     Readmission Risk Interventions Readmission Risk Prevention Plan 06/26/2018 06/21/2018  Transportation Screening Complete Complete  Medication Review Press photographer) Complete Complete  PCP or Specialist appointment within 3-5 days of discharge Complete Complete  HRI or [place] Complete Complete  SW Recovery Care/Counseling Consult Complete Complete  Palliative Care Screening Complete Not [place] Not Applicable Not Applicable  Some recent data might be hidden

## 2019-04-29 NOTE — Progress Notes (Signed)
PROGRESS NOTE    Brittany [name]  [id number] DOB: 01-Oct-1927 DOA: 04/22/2019 PCP: Mayra Neer, MD    Brief Narrative:86 y.o.femalewith history ofA. fib/AVR on warfarin, diastolic CHF,pulmHTN, COPD, chronic back pain and recent fall presenting with altered mental status, poor p.o. intakeandgeneralized weakness.  Patient was found to be hypercalcemic without any improvement with normal saline.  Met with family and decided patient to be comfort care since that was her decision. Assessment & Plan:   Active Problems:   Delirium   Hypercalcemia   AKI (acute kidney injury) ([place])   #1 hypercalcemialikelysecondary to decreased nutritional intake and dehydration. She has not made any significant improvement. She continues to report to her family that it is time for her to go she does not want any aggressive intervention or even artificial nutrition or IV fluids. Discussed with family they agree and want their mother to be comfort care. She has expressed many times that she wants to go and be with her husband and her son who passed away 5 years ago. I will change her to comfort care.Patient is DO NOT RESUSCITATE on admission Patient was in some distress on Ativan and morphine as needed family requested to have Ativan and morphine on a standing dose for comfort. Patient has history of A. fib RVR, diastolic CHF, pulmonary hypertension, COPD,. Discussed with patient's daughter continue comfort care. Patient appears comfortable on morphine and Ativan.  Pressure Injury 05/05/18 Unstageable - Full thickness tissue loss in which the base of the ulcer is covered by slough (yellow, tan, gray, green or brown) and/or eschar (tan, brown or black) in the wound bed. (Active)  05/05/18 0221  Location: Foot  Location Orientation: Anterior;Left  Staging: Unstageable - Full thickness tissue loss in which the base of the ulcer is covered by slough (yellow, tan, gray, green or brown) and/or  eschar (tan, brown or black) in the wound bed.  Wound Description (Comments):   Present on Admission:       Nutrition Problem: Increased nutrient needs Etiology: acute illness, wound healing     Signs/Symptoms: estimated needs    Interventions: Ensure Enlive (each supplement provides 350kcal and 20 grams of protein), Magic cup, MVI  Estimated body mass index is 26.7 kg/m as calculated from the following:   Height as of 01/07/19: '5\' 1"'$  (1.549 m).   Weight as of this encounter: 64.1 kg.   Subjective: Appears in no distress not responding to voice or pain  Objective: Vitals:   04/27/19 0717 04/27/19 1432 04/28/19 0532 04/29/19 0541  BP:  115/84 (!) 160/112 (!) 184/115  Pulse:  75 87 87  Resp:  '20 16 16  '$ Temp:  99.3 F (37.4 C) 98.5 F (36.9 C) 97.6 F (36.4 C)  TempSrc:   Axillary Axillary  SpO2:  92% 92% 92%  Weight: 64.1 kg       Intake/Output Summary (Last 24 hours) at 04/29/2019 1236 Last data filed at 04/29/2019 0542 Gross per 24 hour  Intake --  Output 900 ml  Net -900 ml   Filed Weights   04/26/19 0523 04/27/19 0717  Weight: 61.2 kg 64.1 kg    Examination:  General exam: Appears calm and comfortable  Respiratory system: Clear to auscultation. Respiratory effort normal. Cardiovascular system: S1 & S2 heard, RRR. No JVD, murmurs, rubs, gallops or clicks. No pedal edema. Gastrointestinal system: Abdomen is nondistended, soft and nontender. No organomegaly or masses felt. Normal bowel sounds heard. Central nervous system unresponsive extremities: Symmetric 5 x 5 power.  Skin: No rashes, lesions or ulcers  Data Reviewed: I have personally reviewed following labs and imaging studies  CBC: Recent Labs  Lab 05/07/2019 1538 04/22/2019 1839 04/26/19 0451  WBC 12.2*  --  10.6*  NEUTROABS 7.9*  --   --   HGB 13.1 14.3 13.7  HCT 40.7 42.0 42.3  MCV 88.3  --  87.4  PLT 236  --  102   Basic Metabolic Panel: Recent Labs  Lab 04/24/2019 1538 05/03/2019 1839  05/10/2019 1938 04/26/19 0451 04/27/19 0629  NA 129* 133*  --  135 136  K 3.3* 3.2*  --  3.6 3.6  CL 89* 93*  --  97* 105  CO2 30  --   --  29 24  GLUCOSE 133* 105*  --  103* 146*  BUN 19 17  --  18 24*  CREATININE 1.40* 1.40*  --  1.20* 1.22*  CALCIUM 13.6*  --   --  13.9* 12.8*  13.4*  MG  --   --  1.4* 2.0  --   PHOS  --   --  3.5 3.2  --    GFR: Estimated Creatinine Clearance: 26.8 mL/min (A) (by C-G formula based on SCr of 1.22 mg/dL (H)). Liver Function Tests: Recent Labs  Lab 04/17/2019 1538 04/27/19 0629  AST 26 23  ALT 12 11  ALKPHOS 118 115  BILITOT 0.9 0.8  PROT 8.6* 8.3*  ALBUMIN 2.7* 2.5*   Recent Labs  Lab 05/04/2019 1538  LIPASE 45   Recent Labs  Lab 04/20/2019 1938  AMMONIA 14   Coagulation Profile: Recent Labs  Lab 05/04/2019 1938 04/26/19 0451 04/27/19 0629  INR 4.1* 3.9* 3.5*   Cardiac Enzymes: Recent Labs  Lab 04/26/19 0451  CKTOTAL 28*   BNP (last 3 results) No results for input(s): PROBNP in the last 8760 hours. HbA1C: No results for input(s): HGBA1C in the last 72 hours. CBG: No results for input(s): GLUCAP in the last 168 hours. Lipid Profile: No results for input(s): CHOL, HDL, LDLCALC, TRIG, CHOLHDL, LDLDIRECT in the last 72 hours. Thyroid Function Tests: No results for input(s): TSH, T4TOTAL, FREET4, T3FREE, THYROIDAB in the last 72 hours. Anemia Panel: No results for input(s): VITAMINB12, FOLATE, FERRITIN, TIBC, IRON, RETICCTPCT in the last 72 hours. Sepsis Labs: No results for input(s): PROCALCITON, LATICACIDVEN in the last 168 hours.  Recent Results (from the past 240 hour(s))  Respiratory Panel by RT PCR (Flu A&B, Covid) - Nasopharyngeal Swab     Status: None   Collection Time: 04/13/2019  3:38 PM   Specimen: Nasopharyngeal Swab  Result Value Ref Range Status   SARS Coronavirus 2 by RT PCR NEGATIVE NEGATIVE Final    Comment: (NOTE) SARS-CoV-2 target nucleic acids are NOT DETECTED. The SARS-CoV-2 RNA is generally  detectable in upper respiratoy specimens during the acute phase of infection. The lowest concentration of SARS-CoV-2 viral copies this assay can detect is 131 copies/mL. A negative result does not preclude SARS-Cov-2 infection and should not be used as the sole basis for treatment or other patient management decisions. A negative result may occur with  improper specimen collection/handling, submission of specimen other than nasopharyngeal swab, presence of viral mutation(s) within the areas targeted by this assay, and inadequate number of viral copies (<131 copies/mL). A negative result must be combined with clinical observations, patient history, and epidemiological information. The expected result is Negative. Fact Sheet for Patients:  PinkCheek.be Fact Sheet for Healthcare Providers:  GravelBags.it This test is not yet  ap proved or cleared by the [place] and  has been authorized for detection and/or diagnosis of SARS-CoV-2 by FDA under an Emergency Use Authorization (EUA). This EUA will remain  in effect (meaning this test can be used) for the duration of the COVID-19 declaration under Section 564(b)(1) of the Act, 21 U.S.C. section 360bbb-3(b)(1), unless the authorization is terminated or revoked sooner.    Influenza A by PCR NEGATIVE NEGATIVE Final   Influenza B by PCR NEGATIVE NEGATIVE Final    Comment: (NOTE) The Xpert Xpress SARS-CoV-2/FLU/RSV assay is intended as an aid in  the diagnosis of influenza from Nasopharyngeal swab specimens and  should not be used as a sole basis for treatment. Nasal washings and  aspirates are unacceptable for Xpert Xpress SARS-CoV-2/FLU/RSV  testing. Fact Sheet for Patients: PinkCheek.be Fact Sheet for Healthcare Providers: GravelBags.it This test is not yet approved or cleared by the [place] FDA and  has been  authorized for detection and/or diagnosis of SARS-CoV-2 by  FDA under an Emergency Use Authorization (EUA). This EUA will remain  in effect (meaning this test can be used) for the duration of the  Covid-19 declaration under Section 564(b)(1) of the Act, 21  U.S.C. section 360bbb-3(b)(1), unless the authorization is  terminated or revoked. Performed at [facility], [place] [address] [place], [place] [address]   Urine culture     Status: Abnormal   Collection Time: 05/10/2019  3:38 PM   Specimen: Urine, Random  Result Value Ref Range Status   Specimen Description   Final    URINE, RANDOM Performed at [facility] [address] [place], [place] [address]    Special Requests   Final    NONE Performed at [facility], [place] [address] [place], [place] [address]    Culture (A)  Final    <10,000 COLONIES/mL INSIGNIFICANT GROWTH Performed at [place] [address], [place], [place] [address]    Report Status 04/26/2019 FINAL  Final         Radiology Studies: No results found.      Scheduled Meds: . fentaNYL (SUBLIMAZE) injection  12.5 mcg Intravenous Once  . LORazepam  1 mg Intravenous Q2H  .  morphine injection  2 mg Intravenous Q2H   Continuous Infusions:   LOS: 4 days     Georgette Shell, MD [facility]  If 7PM-7AM, please contact night-coverage [email or web address] Password [facility] 04/29/2019, 12:36 PM

## 2019-05-13 NOTE — Care Management Important Message (Signed)
Important Message  Patient Details IM Letter given to Roque Lias SW Case Manager to present to the Patient Name: Brittany [name] MRN: [id number] Date of Birth: [birth year]   Medicare Important Message Given:  Yes     Kerin Salen May 24, 2019, 12:47 PM

## 2019-05-13 NOTE — Progress Notes (Signed)
Patient with no respirations, breath sounds or heart sounds at this time.  Son at bedside.  Dr Zigmund Daniel aware.

## 2019-05-13 NOTE — Progress Notes (Signed)
PROGRESS NOTE    Brittany [name]  NID:[id number] DOB: 11-17-1927 DOA: 05/01/2019 PCP: Mayra Neer, MD    Brief Narrative: 86 y.o.femalewith history ofA. fib/AVR on warfarin, diastolic CHF,pulmHTN, COPD, chronic back pain and recent fall presenting with altered mental status, poor p.o. intakeandgeneralized weakness.  Patient was found to be hypercalcemic without any improvement with normal saline.  Met with family and decided patient to be comfort care since that was her decision.  Assessment & Plan:   Active Problems:   Delirium   Hypercalcemia   AKI (acute kidney injury) ([place])   #1 acute metabolic encephalopathy secondary to hypercalcemia.  Patient with history of complex medical problems.  She has multiple comorbidities and poor functional status and poor prognosis.  I recommended palliative care and DNR to family.  Patient's family wishes patient to be comfort care.  Patient is a DO NOT RESUSCITATE.  Continue Ativan and morphine for comfort.  Patient appears comfortable does not respond to touch or voice.  Expecting hospital death.  Pressure Injury 05/23/2018 Unstageable - Full thickness tissue loss in which the base of the ulcer is covered by slough (yellow, tan, gray, green or brown) and/or eschar (tan, brown or black) in the wound bed. (Active)  05-23-2018 0221  Location: Foot  Location Orientation: Anterior;Left  Staging: Unstageable - Full thickness tissue loss in which the base of the ulcer is covered by slough (yellow, tan, gray, green or brown) and/or eschar (tan, brown or black) in the wound bed.  Wound Description (Comments):   Present on Admission:       Nutrition Problem: Increased nutrient needs Etiology: acute illness, wound healing     Signs/Symptoms: estimated needs    Interventions: Ensure Enlive (each supplement provides 350kcal and 20 grams of protein), Magic cup, MVI  Estimated body mass index is 26.7 kg/m as calculated from the following:   Height as of 01/07/19: '5\' 1"'$  (1.549 m).   Weight as of this encounter: 64.1 kg.  DVT prophylaxis: None Code Status DNR  family Communication: Discussed with daughter  disposition Plan expecting hospital death   Consultants:   None  Procedures: None Antimicrobials: None Subjective: Appears comfortable  Objective: Vitals:   04/28/19 0532 04/29/19 0541 05/18/19 0402 05/18/2019 0518  BP: (!) 160/112 (!) 184/115  (!) 167/101  Pulse: 87 87  (!) 102  Resp: 16 16 (!) 30 (!) 30  Temp: 98.5 F (36.9 C) 97.6 F (36.4 C)  (!) 100.7 F (38.2 C)  TempSrc: Axillary Axillary  Oral  SpO2: 92% 92%  (!) 87%  Weight:        Intake/Output Summary (Last 24 hours) at May 18, 2019 1237 Last data filed at 05-18-19 0909 Gross per 24 hour  Intake --  Output 1500 ml  Net -1500 ml   Filed Weights   04/26/19 0523 04/27/19 0717  Weight: 61.2 kg 64.1 kg    Examination:  General exam: Appears calm and comfortable  Respiratory system: Clear to auscultation. Respiratory effort normal. Cardiovascular system: S1 & S2 heard, RRR. No JVD, murmurs, rubs, gallops or clicks. No pedal edema. Gastrointestinal system: Abdomen is nondistended, soft and nontender. No organomegaly or masses felt. Normal bowel sounds heard. Central nervous system no response to pain or touch  extremities: Symmetric 5 x 5 power. Skin: No rashes, lesions or ulcers   Data Reviewed: I have personally reviewed following labs and imaging studies  CBC: Recent Labs  Lab 04/21/2019 1538 05/08/2019 1839 04/26/19 0451  WBC 12.2*  --  10.6*  NEUTROABS 7.9*  --   --   HGB 13.1 14.3 13.7  HCT 40.7 42.0 42.3  MCV 88.3  --  87.4  PLT 236  --  086   Basic Metabolic Panel: Recent Labs  Lab 04/17/2019 1538 05/02/2019 1839 04/24/2019 1938 04/26/19 0451 04/27/19 0629  NA 129* 133*  --  135 136  K 3.3* 3.2*  --  3.6 3.6  CL 89* 93*  --  97* 105  CO2 30  --   --  29 24  GLUCOSE 133* 105*  --  103* 146*  BUN 19 17  --  18 24*   CREATININE 1.40* 1.40*  --  1.20* 1.22*  CALCIUM 13.6*  --   --  13.9* 12.8*  13.4*  MG  --   --  1.4* 2.0  --   PHOS  --   --  3.5 3.2  --    GFR: Estimated Creatinine Clearance: 26.8 mL/min (A) (by C-G formula based on SCr of 1.22 mg/dL (H)). Liver Function Tests: Recent Labs  Lab 04/29/2019 1538 04/27/19 0629  AST 26 23  ALT 12 11  ALKPHOS 118 115  BILITOT 0.9 0.8  PROT 8.6* 8.3*  ALBUMIN 2.7* 2.5*   Recent Labs  Lab 04/29/2019 1538  LIPASE 45   Recent Labs  Lab 04/14/2019 1938  AMMONIA 14   Coagulation Profile: Recent Labs  Lab 05/05/2019 1938 04/26/19 0451 04/27/19 0629  INR 4.1* 3.9* 3.5*   Cardiac Enzymes: Recent Labs  Lab 04/26/19 0451  CKTOTAL 28*   BNP (last 3 results) No results for input(s): PROBNP in the last 8760 hours. HbA1C: No results for input(s): HGBA1C in the last 72 hours. CBG: No results for input(s): GLUCAP in the last 168 hours. Lipid Profile: No results for input(s): CHOL, HDL, LDLCALC, TRIG, CHOLHDL, LDLDIRECT in the last 72 hours. Thyroid Function Tests: No results for input(s): TSH, T4TOTAL, FREET4, T3FREE, THYROIDAB in the last 72 hours. Anemia Panel: No results for input(s): VITAMINB12, FOLATE, FERRITIN, TIBC, IRON, RETICCTPCT in the last 72 hours. Sepsis Labs: No results for input(s): PROCALCITON, LATICACIDVEN in the last 168 hours.  Recent Results (from the past 240 hour(s))  Respiratory Panel by RT PCR (Flu A&B, Covid) - Nasopharyngeal Swab     Status: None   Collection Time: 04/26/2019  3:38 PM   Specimen: Nasopharyngeal Swab  Result Value Ref Range Status   SARS Coronavirus 2 by RT PCR NEGATIVE NEGATIVE Final    Comment: (NOTE) SARS-CoV-2 target nucleic acids are NOT DETECTED. The SARS-CoV-2 RNA is generally detectable in upper respiratoy specimens during the acute phase of infection. The lowest concentration of SARS-CoV-2 viral copies this assay can detect is 131 copies/mL. A negative result does not preclude  SARS-Cov-2 infection and should not be used as the sole basis for treatment or other patient management decisions. A negative result may occur with  improper specimen collection/handling, submission of specimen other than nasopharyngeal swab, presence of viral mutation(s) within the areas targeted by this assay, and inadequate number of viral copies (<131 copies/mL). A negative result must be combined with clinical observations, patient history, and epidemiological information. The expected result is Negative. Fact Sheet for Patients:  PinkCheek.be Fact Sheet for Healthcare Providers:  GravelBags.it This test is not yet ap proved or cleared by the [place] FDA and  has been authorized for detection and/or diagnosis of SARS-CoV-2 by FDA under an Emergency Use Authorization (EUA). This EUA will remain  in effect (meaning this test  can be used) for the duration of the COVID-19 declaration under Section 564(b)(1) of the Act, 21 U.S.C. section 360bbb-3(b)(1), unless the authorization is terminated or revoked sooner.    Influenza A by PCR NEGATIVE NEGATIVE Final   Influenza B by PCR NEGATIVE NEGATIVE Final    Comment: (NOTE) The Xpert Xpress SARS-CoV-2/FLU/RSV assay is intended as an aid in  the diagnosis of influenza from Nasopharyngeal swab specimens and  should not be used as a sole basis for treatment. Nasal washings and  aspirates are unacceptable for Xpert Xpress SARS-CoV-2/FLU/RSV  testing. Fact Sheet for Patients: PinkCheek.be Fact Sheet for Healthcare Providers: GravelBags.it This test is not yet approved or cleared by the [place] FDA and  has been authorized for detection and/or diagnosis of SARS-CoV-2 by  FDA under an Emergency Use Authorization (EUA). This EUA will remain  in effect (meaning this test can be used) for the duration of the  Covid-19  declaration under Section 564(b)(1) of the Act, 21  U.S.C. section 360bbb-3(b)(1), unless the authorization is  terminated or revoked. Performed at [facility], [place] [address] [place], [place] [address]   Urine culture     Status: Abnormal   Collection Time: 05/12/2019  3:38 PM   Specimen: Urine, Random  Result Value Ref Range Status   Specimen Description   Final    URINE, RANDOM Performed at [facility] [address], [place], [place] [address]    Special Requests   Final    NONE Performed at [facility], [place] [address], [place], [place] [address]    Culture (A)  Final    <10,000 COLONIES/mL INSIGNIFICANT GROWTH Performed at [facility] [address], [place], [place] [address]    Report Status 04/26/2019 FINAL  Final         Radiology Studies: No results found.      Scheduled Meds: . fentaNYL (SUBLIMAZE) injection  12.5 mcg Intravenous Once  . LORazepam  1 mg Intravenous Q2H  .  morphine injection  2 mg Intravenous Q2H   Continuous Infusions:   LOS: 5 days     Georgette Shell, MD [facility]  If 7PM-7AM, please contact night-coverage [email or web address] Password [facility] 05/26/2019, 12:37 PM

## 2019-05-13 NOTE — Progress Notes (Signed)
[place] donor services contacted.  Ref number [id number].

## 2019-05-13 NOTE — Death Summary Note (Signed)
Death Summary  Brittany [name] [id number] DOB: August 08, 1927 DOA: 05/21/19  PCP: Brittany Neer, MD  Admit date: 21-May-2019 Date of Death: 05/27/19 Time of Death: 3:28 PM Notification: Brittany Neer, MD notified of death of 05-27-19   History of present illness:  Brittany [name] is a 86 y.o. female with a history of diastolic heart failure, A. fib RVR, pulmonary hypertension, COPD, chronic back pain, multiple hospital admissions recently, multiple falls Brittany [name] presented with complaint of altered mental status decreased appetite weight loss and deterioration.  She was found to be severely dehydrated and hypercalcemic.  Her symptoms did not improve with aggressive IV fluids. Brittany [name] did not improve after aggressive IV fluids.  Patient stopped eating completely.  She told her family that she can see her husband and her son who was dead few years ago and she wants to be with him.  Patient was DNR/DNI on admission.  Per family request patient was made comfort care and patient passed away peacefully on 05/26/19.   Final Diagnoses:  Congestive heart failure A. fib RVR Pulmonary hypertension Severe hypercalcemia   The results of significant diagnostics from this hospitalization (including imaging, microbiology, ancillary and laboratory) are listed below for reference.    Significant Diagnostic Studies: DG Thoracic Spine 2 View  Result Date: 04/11/2019 CLINICAL DATA:  Recent fall with back pain, initial encounter EXAM: THORACIC SPINE 2 VIEWS COMPARISON:  None. FINDINGS: Vertebral body height is well maintained. Mild osteophytic changes are seen. No paraspinal mass lesion is noted. No pedicle abnormality is noted. Aortic calcifications are seen. IMPRESSION: Degenerative change without acute abnormality. Aortic Atherosclerosis (ICD10-I70.0). Electronically Signed   By: Inez Catalina M.D.   On: 04/11/2019 11:55   DG Lumbar Spine Complete  Result Date:  04/11/2019 CLINICAL DATA:  Recent fall with back pain, initial encounter EXAM: LUMBAR SPINE - COMPLETE 4+ VIEW COMPARISON:  06/14/2018 FINDINGS: Five lumbar type vertebral bodies are well visualized. Vertebral body height is well maintained. Disc space narrowing is noted from L1-L4 with associated osteophytic changes. Disc space narrowing is also noted at L5-S1. No pars defects are seen. Aortic calcifications are noted without aneurysmal dilatation. IMPRESSION: Multilevel degenerative change stable from the prior exam. No acute abnormality noted. Electronically Signed   By: Inez Catalina M.D.   On: 04/11/2019 11:52   CT Head Wo Contrast  Result Date: 04/11/2019 CLINICAL DATA:  Falls, on anticoagulation EXAM: CT HEAD WITHOUT CONTRAST TECHNIQUE: Contiguous axial images were obtained from the base of the skull through the vertex without intravenous contrast. COMPARISON:  05/04/2018 FINDINGS: Brain: There is no acute intracranial hemorrhage, mass-effect, or edema. There is no new loss of gray differentiation. 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 Confluent areas of hypoattenuation in the supratentorial white matter are nonspecific but probably reflect advanced chronic microvascular ischemic changes. There is no extra-axial fluid collection. Prominence of the ventricles and sulci reflects stable parenchymal volume loss. Vascular: There is atherosclerotic calcification at the skull base. Skull: Calvarium is unremarkable. Sinuses/Orbits: No acute finding. Other: None. IMPRESSION: No evidence of acute intracranial injury. Electronically Signed   By: Macy Mis M.D.   On: 04/11/2019 12:15   CT Cervical Spine Wo Contrast  Result Date: 04/11/2019 CLINICAL DATA:  Falls EXAM: CT CERVICAL SPINE WITHOUT CONTRAST TECHNIQUE: Multidetector CT imaging of the cervical spine was performed without intravenous contrast. Multiplanar CT image reconstructions were also generated. COMPARISON:   None. FINDINGS: Alignment: Trace multilevel degenerative listhesis. Skull base and  vertebrae: No acute fracture. Vertebral body heights are maintained. There is a 1.1 cm lytic lesion of the left aspect of the C1 anterior arch. Probable slight extraosseous prevertebral soft tissue extension. Soft tissues and spinal canal: No prevertebral fluid or swelling. No visible canal hematoma. Disc levels: Multilevel degenerative changes are present including disc space narrowing, endplate osteophytes, and facet and uncovertebral hypertrophy. There is no high-grade osseous encroachment on the spinal canal. Multilevel neural foraminal stenosis is present. Upper chest: Emphysematous changes at the lung apices. Other: Calcified plaque at the common carotid bifurcations. IMPRESSION: No acute cervical spine fracture. Small lytic lesion of the left C1 anterior arch, which could reflect metastatic disease or myeloma. Electronically Signed   By: Macy Mis M.D.   On: 04/11/2019 12:24   DG Chest Port 1 View  Result Date: 05/03/2019 CLINICAL DATA:  ALOC short of breath EXAM: PORTABLE CHEST 1 VIEW COMPARISON:  06/24/2018 FINDINGS: Post sternotomy changes and valve prosthesis. Trace left pleural effusion. Enlarged cardiomediastinal silhouette without overt failure. Aortic atherosclerosis. No pneumothorax. IMPRESSION: 1. Suspect trace left effusion. Small foci of atelectasis at the bases. 2. Mild cardiomegaly Electronically Signed   By: Donavan Foil M.D.   On: 05/08/2019 15:50   DG Humerus Left  Result Date: 04/11/2019 CLINICAL DATA:  Fall, left side pain EXAM: LEFT HUMERUS - 2+ VIEW COMPARISON:  None. FINDINGS: There is no evidence of fracture or other focal bone lesions. Soft tissues are unremarkable. IMPRESSION: Negative. Electronically Signed   By: Rolm Baptise M.D.   On: 04/11/2019 11:51   DG Hip Unilat With Pelvis 2-3 Views Left  Result Date: 04/11/2019 CLINICAL DATA:  Fall.  Left side pain EXAM: DG HIP (WITH OR  WITHOUT PELVIS) 2-3V LEFT COMPARISON:  None. FINDINGS: Slight joint space narrowing and spurring in the hip joints bilaterally. SI joints symmetric and unremarkable. No acute bony abnormality. Specifically, no fracture, subluxation, or dislocation. IMPRESSION: No acute bony abnormality. Electronically Signed   By: Rolm Baptise M.D.   On: 04/11/2019 11:50   DG Hip Unilat With Pelvis 2-3 Views Right  Result Date: 04/11/2019 CLINICAL DATA:  Right hip pain after fall EXAM: DG HIP (WITH OR WITHOUT PELVIS) 2-3V RIGHT COMPARISON:  06/07/2017 FINDINGS: There is no evidence of hip fracture or dislocation. Osseous structures are demineralized. There is no evidence of arthropathy or other focal bone abnormality. Sacrum partially obscured by overlying bowel gas. IMPRESSION: No acute fracture or dislocation of the right hip. Electronically Signed   By: Davina Poke D.O.   On: 04/11/2019 16:21    Microbiology: Recent Results (from the past 240 hour(s))  Respiratory Panel by RT PCR (Flu A&B, Covid) - Nasopharyngeal Swab     Status: None   Collection Time: 04/19/2019  3:38 PM   Specimen: Nasopharyngeal Swab  Result Value Ref Range Status   SARS Coronavirus 2 by RT PCR NEGATIVE NEGATIVE Final    Comment: (NOTE) SARS-CoV-2 target nucleic acids are NOT DETECTED. The SARS-CoV-2 RNA is generally detectable in upper respiratoy specimens during the acute phase of infection. The lowest concentration of SARS-CoV-2 viral copies this assay can detect is 131 copies/mL. A negative result does not preclude SARS-Cov-2 infection and should not be used as the sole basis for treatment or other patient management decisions. A negative result may occur with  improper specimen collection/handling, submission of specimen other than nasopharyngeal swab, presence of viral mutation(s) within the areas targeted by this assay, and inadequate number of viral copies (<131 copies/mL). A negative  result must be combined with  clinical observations, patient history, and epidemiological information. The expected result is Negative. Fact Sheet for Patients:  PinkCheek.be Fact Sheet for Healthcare Providers:  GravelBags.it This test is not yet ap proved or cleared by the [place] FDA and  has been authorized for detection and/or diagnosis of SARS-CoV-2 by FDA under an Emergency Use Authorization (EUA). This EUA will remain  in effect (meaning this test can be used) for the duration of the COVID-19 declaration under Section 564(b)(1) of the Act, 21 U.S.C. section 360bbb-3(b)(1), unless the authorization is terminated or revoked sooner.    Influenza A by PCR NEGATIVE NEGATIVE Final   Influenza B by PCR NEGATIVE NEGATIVE Final    Comment: (NOTE) The Xpert Xpress SARS-CoV-2/FLU/RSV assay is intended as an aid in  the diagnosis of influenza from Nasopharyngeal swab specimens and  should not be used as a sole basis for treatment. Nasal washings and  aspirates are unacceptable for Xpert Xpress SARS-CoV-2/FLU/RSV  testing. Fact Sheet for Patients: PinkCheek.be Fact Sheet for Healthcare Providers: GravelBags.it This test is not yet approved or cleared by the [place] FDA and  has been authorized for detection and/or diagnosis of SARS-CoV-2 by  FDA under an Emergency Use Authorization (EUA). This EUA will remain  in effect (meaning this test can be used) for the duration of the  Covid-19 declaration under Section 564(b)(1) of the Act, 21  U.S.C. section 360bbb-3(b)(1), unless the authorization is  terminated or revoked. Performed at [facility], [place] [address] [place], [place] [address]   Urine culture     Status: Abnormal   Collection Time: 04/24/2019  3:38 PM   Specimen: Urine, Random  Result Value Ref Range Status   Specimen Description   Final    URINE,  RANDOM Performed at [facility] [address], [place], [place] [address]    Special Requests   Final    NONE Performed at [facility], [place] [address] [place], [place] [address]    Culture (A)  Final    <10,000 COLONIES/mL INSIGNIFICANT GROWTH Performed at [facility] [address], [place], [place] [address]    Report Status 04/26/2019 FINAL  Final     Labs: Basic Metabolic Panel: Recent Labs  Lab 05/11/2019 1538 05/03/2019 1538 04/23/2019 1839 04/16/2019 1839 05/10/2019 1938 04/26/19 0451 04/27/19 0629  NA 129*  --  133*  --   --  135 136  K 3.3*   < > 3.2*   < >  --  3.6 3.6  CL 89*  --  93*  --   --  97* 105  CO2 30  --   --   --   --  29 24  GLUCOSE 133*  --  105*  --   --  103* 146*  BUN 19  --  17  --   --  18 24*  CREATININE 1.40*  --  1.40*  --   --  1.20* 1.22*  CALCIUM 13.6*  --   --   --   --  13.9* 12.8*  13.4*  MG  --   --   --   --  1.4* 2.0  --   PHOS  --   --   --   --  3.5 3.2  --    < > = values in this interval not displayed.   Liver Function Tests: Recent Labs  Lab 04/15/2019 1538 04/27/19 [id number]  AST 26 23  ALT 12 11  ALKPHOS 118 115  BILITOT 0.9 0.8  PROT 8.6* 8.3*  ALBUMIN 2.7* 2.5*   Recent Labs  Lab 04/24/2019 1538  LIPASE 45   Recent Labs  Lab 05/05/2019 1938  AMMONIA 14   CBC: Recent Labs  Lab 04/22/2019 1538 04/14/2019 1839 04/26/19 0451  WBC 12.2*  --  10.6*  NEUTROABS 7.9*  --   --   HGB 13.1 14.3 13.7  HCT 40.7 42.0 42.3  MCV 88.3  --  87.4  PLT 236  --  255   Cardiac Enzymes: Recent Labs  Lab 04/26/19 0451  CKTOTAL 28*   D-Dimer No results for input(s): DDIMER in the last 72 hours. BNP: Invalid input(s): POCBNP CBG: No results for input(s): GLUCAP in the last 168 hours. Anemia work up No results for input(s): VITAMINB12, FOLATE, FERRITIN, TIBC, IRON, RETICCTPCT in the last 72 hours. Urinalysis    Component Value Date/Time   COLORURINE YELLOW 04/15/2019 1538    APPEARANCEUR CLEAR 05/04/2019 1538   LABSPEC 1.008 04/29/2019 1538   PHURINE 6.0 05/02/2019 1538   GLUCOSEU NEGATIVE 04/20/2019 1538   GLUCOSEU NEGATIVE 06/25/2014 1201   HGBUR SMALL (A) 05/07/2019 1538   BILIRUBINUR NEGATIVE 04/14/2019 1538   KETONESUR NEGATIVE 04/13/2019 1538   PROTEINUR NEGATIVE 05/08/2019 1538   UROBILINOGEN 0.2 08/27/2014 1954   NITRITE NEGATIVE 04/24/2019 1538   LEUKOCYTESUR NEGATIVE 05/02/2019 1538   Sepsis Labs Invalid input(s): PROCALCITONIN,  WBC,  LACTICIDVEN     SIGNED:  Georgette Shell, MD  [facility] 05/01/2019, 3:46 PM

## 2019-05-13 DEATH — deceased

## 2019-08-14 DIAGNOSIS — G894 Chronic pain syndrome: Secondary | ICD-10-CM | POA: Diagnosis not present

## 2019-08-14 DIAGNOSIS — E782 Mixed hyperlipidemia: Secondary | ICD-10-CM | POA: Diagnosis not present
